# Patient Record
Sex: Female | Born: 1950 | Race: White | Hispanic: No | Smoking: Former smoker
Health system: Southern US, Community
[De-identification: ages and names within clinical notes are randomized; demographics above are authoritative.]

## PROBLEM LIST (undated history)

## (undated) DIAGNOSIS — J45909 Unspecified asthma, uncomplicated: Secondary | ICD-10-CM

## (undated) DIAGNOSIS — K922 Gastrointestinal hemorrhage, unspecified: Secondary | ICD-10-CM

## (undated) DIAGNOSIS — F419 Anxiety disorder, unspecified: Secondary | ICD-10-CM

## (undated) DIAGNOSIS — J189 Pneumonia, unspecified organism: Secondary | ICD-10-CM

## (undated) DIAGNOSIS — T8859XA Other complications of anesthesia, initial encounter: Secondary | ICD-10-CM

## (undated) DIAGNOSIS — C3490 Malignant neoplasm of unspecified part of unspecified bronchus or lung: Secondary | ICD-10-CM

## (undated) DIAGNOSIS — K589 Irritable bowel syndrome without diarrhea: Secondary | ICD-10-CM

## (undated) DIAGNOSIS — M1990 Unspecified osteoarthritis, unspecified site: Secondary | ICD-10-CM

## (undated) DIAGNOSIS — Z9981 Dependence on supplemental oxygen: Secondary | ICD-10-CM

## (undated) DIAGNOSIS — D696 Thrombocytopenia, unspecified: Secondary | ICD-10-CM

## (undated) DIAGNOSIS — K631 Perforation of intestine (nontraumatic): Secondary | ICD-10-CM

## (undated) DIAGNOSIS — R109 Unspecified abdominal pain: Secondary | ICD-10-CM

## (undated) DIAGNOSIS — R011 Cardiac murmur, unspecified: Secondary | ICD-10-CM

## (undated) DIAGNOSIS — N3281 Overactive bladder: Secondary | ICD-10-CM

## (undated) DIAGNOSIS — Z8711 Personal history of peptic ulcer disease: Secondary | ICD-10-CM

## (undated) DIAGNOSIS — F32A Depression, unspecified: Secondary | ICD-10-CM

## (undated) DIAGNOSIS — Z9289 Personal history of other medical treatment: Secondary | ICD-10-CM

## (undated) DIAGNOSIS — K219 Gastro-esophageal reflux disease without esophagitis: Secondary | ICD-10-CM

## (undated) DIAGNOSIS — J9610 Chronic respiratory failure, unspecified whether with hypoxia or hypercapnia: Secondary | ICD-10-CM

## (undated) DIAGNOSIS — K5732 Diverticulitis of large intestine without perforation or abscess without bleeding: Secondary | ICD-10-CM

## (undated) DIAGNOSIS — J42 Unspecified chronic bronchitis: Secondary | ICD-10-CM

## (undated) DIAGNOSIS — K7460 Unspecified cirrhosis of liver: Secondary | ICD-10-CM

## (undated) DIAGNOSIS — Z433 Encounter for attention to colostomy: Secondary | ICD-10-CM

## (undated) DIAGNOSIS — M5010 Cervical disc disorder with radiculopathy, unspecified cervical region: Secondary | ICD-10-CM

## (undated) DIAGNOSIS — G4730 Sleep apnea, unspecified: Secondary | ICD-10-CM

## (undated) DIAGNOSIS — R161 Splenomegaly, not elsewhere classified: Secondary | ICD-10-CM

## (undated) DIAGNOSIS — Z950 Presence of cardiac pacemaker: Secondary | ICD-10-CM

## (undated) DIAGNOSIS — D638 Anemia in other chronic diseases classified elsewhere: Secondary | ICD-10-CM

## (undated) DIAGNOSIS — I313 Pericardial effusion (noninflammatory): Secondary | ICD-10-CM

## (undated) DIAGNOSIS — F329 Major depressive disorder, single episode, unspecified: Secondary | ICD-10-CM

## (undated) DIAGNOSIS — G8929 Other chronic pain: Secondary | ICD-10-CM

## (undated) DIAGNOSIS — C539 Malignant neoplasm of cervix uteri, unspecified: Secondary | ICD-10-CM

## (undated) DIAGNOSIS — I10 Essential (primary) hypertension: Secondary | ICD-10-CM

## (undated) DIAGNOSIS — J449 Chronic obstructive pulmonary disease, unspecified: Secondary | ICD-10-CM

## (undated) DIAGNOSIS — G43909 Migraine, unspecified, not intractable, without status migrainosus: Secondary | ICD-10-CM

## (undated) DIAGNOSIS — E119 Type 2 diabetes mellitus without complications: Secondary | ICD-10-CM

## (undated) DIAGNOSIS — T4145XA Adverse effect of unspecified anesthetic, initial encounter: Secondary | ICD-10-CM

## (undated) DIAGNOSIS — E785 Hyperlipidemia, unspecified: Secondary | ICD-10-CM

## (undated) DIAGNOSIS — Z8719 Personal history of other diseases of the digestive system: Secondary | ICD-10-CM

## (undated) DIAGNOSIS — I509 Heart failure, unspecified: Secondary | ICD-10-CM

## (undated) DIAGNOSIS — J439 Emphysema, unspecified: Secondary | ICD-10-CM

## (undated) DIAGNOSIS — I3139 Other pericardial effusion (noninflammatory): Secondary | ICD-10-CM

## (undated) HISTORY — DX: Cardiac murmur, unspecified: R01.1

## (undated) HISTORY — DX: Anxiety disorder, unspecified: F41.9

## (undated) HISTORY — PX: HERNIA REPAIR: SHX51

## (undated) HISTORY — DX: Diverticulitis of large intestine without perforation or abscess without bleeding: K57.32

## (undated) HISTORY — DX: Major depressive disorder, single episode, unspecified: F32.9

## (undated) HISTORY — DX: Emphysema, unspecified: J43.9

## (undated) HISTORY — DX: Essential (primary) hypertension: I10

## (undated) HISTORY — DX: Splenomegaly, not elsewhere classified: R16.1

## (undated) HISTORY — PX: FRACTURE SURGERY: SHX138

## (undated) HISTORY — PX: COLON SURGERY: SHX602

## (undated) HISTORY — DX: Irritable bowel syndrome, unspecified: K58.9

## (undated) HISTORY — DX: Thrombocytopenia, unspecified: D69.6

## (undated) HISTORY — DX: Gastrointestinal hemorrhage, unspecified: K92.2

## (undated) HISTORY — DX: Depression, unspecified: F32.A

## (undated) HISTORY — DX: Unspecified osteoarthritis, unspecified site: M19.90

## (undated) HISTORY — DX: Presence of cardiac pacemaker: Z95.0

## (undated) HISTORY — DX: Hyperlipidemia, unspecified: E78.5

## (undated) HISTORY — PX: CHOLECYSTECTOMY: SHX55

## (undated) HISTORY — PX: TUBAL LIGATION: SHX77

## (undated) HISTORY — DX: Unspecified asthma, uncomplicated: J45.909

## (undated) HISTORY — DX: Overactive bladder: N32.81

## (undated) HISTORY — PX: ABDOMINAL HYSTERECTOMY: SHX81

## (undated) HISTORY — DX: Perforation of intestine (nontraumatic): K63.1

## (undated) HISTORY — DX: Malignant neoplasm of cervix uteri, unspecified: C53.9

## (undated) HISTORY — DX: Hypercalcemia: E83.52

## (undated) HISTORY — DX: Unspecified cirrhosis of liver: K74.60

---
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
## 1997-08-26 ENCOUNTER — Inpatient Hospital Stay (HOSPITAL_COMMUNITY): Admission: EM | Admit: 1997-08-26 | Discharge: 1997-08-29 | Payer: Self-pay | Admitting: Cardiology

## 2000-09-23 ENCOUNTER — Emergency Department (HOSPITAL_COMMUNITY): Admission: EM | Admit: 2000-09-23 | Discharge: 2000-09-23 | Payer: Self-pay | Admitting: Emergency Medicine

## 2000-09-24 ENCOUNTER — Encounter: Payer: Self-pay | Admitting: Emergency Medicine

## 2000-09-24 ENCOUNTER — Emergency Department (HOSPITAL_COMMUNITY): Admission: EM | Admit: 2000-09-24 | Discharge: 2000-09-24 | Payer: Self-pay | Admitting: Emergency Medicine

## 2000-11-15 ENCOUNTER — Emergency Department (HOSPITAL_COMMUNITY): Admission: EM | Admit: 2000-11-15 | Discharge: 2000-11-15 | Payer: Self-pay | Admitting: Emergency Medicine

## 2000-11-30 ENCOUNTER — Emergency Department (HOSPITAL_COMMUNITY): Admission: EM | Admit: 2000-11-30 | Discharge: 2000-11-30 | Payer: Self-pay | Admitting: *Deleted

## 2000-11-30 ENCOUNTER — Encounter: Payer: Self-pay | Admitting: *Deleted

## 2001-01-28 ENCOUNTER — Emergency Department (HOSPITAL_COMMUNITY): Admission: EM | Admit: 2001-01-28 | Discharge: 2001-01-28 | Payer: Self-pay | Admitting: Emergency Medicine

## 2001-01-28 ENCOUNTER — Encounter: Payer: Self-pay | Admitting: Emergency Medicine

## 2001-02-23 ENCOUNTER — Emergency Department (HOSPITAL_COMMUNITY): Admission: EM | Admit: 2001-02-23 | Discharge: 2001-02-23 | Payer: Self-pay | Admitting: Emergency Medicine

## 2001-02-23 ENCOUNTER — Encounter: Payer: Self-pay | Admitting: Emergency Medicine

## 2001-02-25 ENCOUNTER — Emergency Department (HOSPITAL_COMMUNITY): Admission: EM | Admit: 2001-02-25 | Discharge: 2001-02-25 | Payer: Self-pay | Admitting: Emergency Medicine

## 2001-07-05 ENCOUNTER — Encounter: Payer: Self-pay | Admitting: Emergency Medicine

## 2001-07-05 ENCOUNTER — Inpatient Hospital Stay (HOSPITAL_COMMUNITY): Admission: EM | Admit: 2001-07-05 | Discharge: 2001-07-06 | Payer: Self-pay | Admitting: Emergency Medicine

## 2001-07-06 ENCOUNTER — Encounter: Payer: Self-pay | Admitting: Cardiology

## 2001-07-06 ENCOUNTER — Encounter: Payer: Self-pay | Admitting: Internal Medicine

## 2001-12-30 ENCOUNTER — Encounter: Payer: Self-pay | Admitting: Family Medicine

## 2001-12-30 ENCOUNTER — Encounter: Admission: RE | Admit: 2001-12-30 | Discharge: 2001-12-30 | Payer: Self-pay | Admitting: Family Medicine

## 2002-01-07 ENCOUNTER — Encounter: Admission: RE | Admit: 2002-01-07 | Discharge: 2002-01-07 | Payer: Self-pay | Admitting: Family Medicine

## 2002-01-07 ENCOUNTER — Encounter: Payer: Self-pay | Admitting: Family Medicine

## 2002-01-28 DIAGNOSIS — C3490 Malignant neoplasm of unspecified part of unspecified bronchus or lung: Secondary | ICD-10-CM

## 2002-01-28 HISTORY — DX: Malignant neoplasm of unspecified part of unspecified bronchus or lung: C34.90

## 2002-01-28 HISTORY — PX: LUNG REMOVAL, PARTIAL: SHX233

## 2002-02-09 ENCOUNTER — Encounter: Payer: Self-pay | Admitting: Critical Care Medicine

## 2002-02-09 ENCOUNTER — Ambulatory Visit (HOSPITAL_COMMUNITY): Admission: RE | Admit: 2002-02-09 | Discharge: 2002-02-09 | Payer: Self-pay | Admitting: Critical Care Medicine

## 2002-02-09 ENCOUNTER — Encounter (INDEPENDENT_AMBULATORY_CARE_PROVIDER_SITE_OTHER): Payer: Self-pay | Admitting: *Deleted

## 2002-02-10 ENCOUNTER — Encounter (INDEPENDENT_AMBULATORY_CARE_PROVIDER_SITE_OTHER): Payer: Self-pay | Admitting: *Deleted

## 2002-03-18 ENCOUNTER — Encounter: Payer: Self-pay | Admitting: Thoracic Surgery (Cardiothoracic Vascular Surgery)

## 2002-03-22 ENCOUNTER — Encounter (INDEPENDENT_AMBULATORY_CARE_PROVIDER_SITE_OTHER): Payer: Self-pay | Admitting: *Deleted

## 2002-03-22 ENCOUNTER — Encounter: Payer: Self-pay | Admitting: Thoracic Surgery (Cardiothoracic Vascular Surgery)

## 2002-03-22 ENCOUNTER — Inpatient Hospital Stay (HOSPITAL_COMMUNITY)
Admission: RE | Admit: 2002-03-22 | Discharge: 2002-03-28 | Payer: Self-pay | Admitting: Thoracic Surgery (Cardiothoracic Vascular Surgery)

## 2002-03-23 ENCOUNTER — Encounter: Payer: Self-pay | Admitting: Thoracic Surgery (Cardiothoracic Vascular Surgery)

## 2002-03-24 ENCOUNTER — Encounter: Payer: Self-pay | Admitting: Thoracic Surgery (Cardiothoracic Vascular Surgery)

## 2002-03-25 ENCOUNTER — Encounter: Payer: Self-pay | Admitting: Thoracic Surgery (Cardiothoracic Vascular Surgery)

## 2002-03-26 ENCOUNTER — Encounter: Payer: Self-pay | Admitting: Thoracic Surgery (Cardiothoracic Vascular Surgery)

## 2002-03-27 ENCOUNTER — Encounter: Payer: Self-pay | Admitting: Thoracic Surgery (Cardiothoracic Vascular Surgery)

## 2002-04-12 ENCOUNTER — Encounter
Admission: RE | Admit: 2002-04-12 | Discharge: 2002-04-12 | Payer: Self-pay | Admitting: Thoracic Surgery (Cardiothoracic Vascular Surgery)

## 2002-04-12 ENCOUNTER — Encounter: Payer: Self-pay | Admitting: Thoracic Surgery (Cardiothoracic Vascular Surgery)

## 2002-04-15 ENCOUNTER — Encounter: Payer: Self-pay | Admitting: Thoracic Surgery (Cardiothoracic Vascular Surgery)

## 2002-04-15 ENCOUNTER — Encounter
Admission: RE | Admit: 2002-04-15 | Discharge: 2002-04-15 | Payer: Self-pay | Admitting: Thoracic Surgery (Cardiothoracic Vascular Surgery)

## 2002-06-24 ENCOUNTER — Encounter
Admission: RE | Admit: 2002-06-24 | Discharge: 2002-06-24 | Payer: Self-pay | Admitting: Thoracic Surgery (Cardiothoracic Vascular Surgery)

## 2002-06-24 ENCOUNTER — Encounter: Payer: Self-pay | Admitting: Thoracic Surgery (Cardiothoracic Vascular Surgery)

## 2002-10-12 ENCOUNTER — Encounter
Admission: RE | Admit: 2002-10-12 | Discharge: 2002-10-12 | Payer: Self-pay | Admitting: Thoracic Surgery (Cardiothoracic Vascular Surgery)

## 2002-10-12 ENCOUNTER — Encounter: Payer: Self-pay | Admitting: Thoracic Surgery (Cardiothoracic Vascular Surgery)

## 2002-10-22 ENCOUNTER — Encounter: Admission: RE | Admit: 2002-10-22 | Discharge: 2002-10-22 | Payer: Self-pay | Admitting: Family Medicine

## 2002-10-22 ENCOUNTER — Encounter: Payer: Self-pay | Admitting: Family Medicine

## 2002-11-02 ENCOUNTER — Encounter: Payer: Self-pay | Admitting: Oncology

## 2002-11-02 ENCOUNTER — Ambulatory Visit (HOSPITAL_COMMUNITY): Admission: RE | Admit: 2002-11-02 | Discharge: 2002-11-02 | Payer: Self-pay | Admitting: Oncology

## 2003-06-03 ENCOUNTER — Inpatient Hospital Stay (HOSPITAL_COMMUNITY): Admission: AD | Admit: 2003-06-03 | Discharge: 2003-06-04 | Payer: Self-pay | Admitting: Family Medicine

## 2003-08-17 ENCOUNTER — Ambulatory Visit (HOSPITAL_COMMUNITY): Admission: RE | Admit: 2003-08-17 | Discharge: 2003-08-17 | Payer: Self-pay | Admitting: Oncology

## 2003-11-30 ENCOUNTER — Ambulatory Visit: Payer: Self-pay | Admitting: Family Medicine

## 2003-12-06 ENCOUNTER — Ambulatory Visit: Payer: Self-pay | Admitting: Internal Medicine

## 2003-12-06 ENCOUNTER — Inpatient Hospital Stay (HOSPITAL_COMMUNITY): Admission: EM | Admit: 2003-12-06 | Discharge: 2003-12-09 | Payer: Self-pay

## 2003-12-06 ENCOUNTER — Ambulatory Visit: Payer: Self-pay | Admitting: Pulmonary Disease

## 2003-12-12 ENCOUNTER — Encounter (HOSPITAL_COMMUNITY): Admission: RE | Admit: 2003-12-12 | Discharge: 2004-03-11 | Payer: Self-pay

## 2003-12-15 ENCOUNTER — Ambulatory Visit: Payer: Self-pay | Admitting: Family Medicine

## 2003-12-26 ENCOUNTER — Ambulatory Visit: Payer: Self-pay | Admitting: Critical Care Medicine

## 2004-01-10 ENCOUNTER — Ambulatory Visit: Payer: Self-pay | Admitting: Internal Medicine

## 2004-01-16 ENCOUNTER — Ambulatory Visit: Payer: Self-pay | Admitting: Family Medicine

## 2004-01-18 ENCOUNTER — Ambulatory Visit: Payer: Self-pay | Admitting: Internal Medicine

## 2004-01-18 ENCOUNTER — Ambulatory Visit: Payer: Self-pay | Admitting: Cardiology

## 2004-01-19 ENCOUNTER — Encounter: Payer: Self-pay | Admitting: Cardiology

## 2004-01-19 ENCOUNTER — Inpatient Hospital Stay (HOSPITAL_COMMUNITY): Admission: EM | Admit: 2004-01-19 | Discharge: 2004-01-20 | Payer: Self-pay | Admitting: Emergency Medicine

## 2004-01-24 ENCOUNTER — Ambulatory Visit: Payer: Self-pay | Admitting: Family Medicine

## 2004-01-25 ENCOUNTER — Ambulatory Visit: Payer: Self-pay | Admitting: Critical Care Medicine

## 2004-02-06 ENCOUNTER — Ambulatory Visit: Payer: Self-pay | Admitting: Internal Medicine

## 2004-02-09 ENCOUNTER — Ambulatory Visit: Payer: Self-pay | Admitting: Oncology

## 2004-02-22 ENCOUNTER — Ambulatory Visit (HOSPITAL_COMMUNITY): Admission: RE | Admit: 2004-02-22 | Discharge: 2004-02-22 | Payer: Self-pay | Admitting: Oncology

## 2004-02-27 ENCOUNTER — Ambulatory Visit (HOSPITAL_COMMUNITY): Admission: RE | Admit: 2004-02-27 | Discharge: 2004-02-27 | Payer: Self-pay | Admitting: Oncology

## 2004-03-26 ENCOUNTER — Ambulatory Visit: Payer: Self-pay | Admitting: Critical Care Medicine

## 2004-03-28 ENCOUNTER — Ambulatory Visit: Payer: Self-pay | Admitting: Oncology

## 2004-03-30 ENCOUNTER — Ambulatory Visit: Payer: Self-pay | Admitting: Family Medicine

## 2004-04-23 ENCOUNTER — Ambulatory Visit: Payer: Self-pay | Admitting: Family Medicine

## 2004-05-14 ENCOUNTER — Encounter: Admission: RE | Admit: 2004-05-14 | Discharge: 2004-05-14 | Payer: Self-pay | Admitting: Family Medicine

## 2004-05-23 ENCOUNTER — Ambulatory Visit: Payer: Self-pay | Admitting: Internal Medicine

## 2004-07-10 ENCOUNTER — Ambulatory Visit: Payer: Self-pay | Admitting: Internal Medicine

## 2004-07-20 ENCOUNTER — Inpatient Hospital Stay (HOSPITAL_COMMUNITY): Admission: AD | Admit: 2004-07-20 | Discharge: 2004-07-25 | Payer: Self-pay | Admitting: Family Medicine

## 2004-07-21 ENCOUNTER — Ambulatory Visit: Payer: Self-pay | Admitting: Internal Medicine

## 2004-08-06 ENCOUNTER — Ambulatory Visit: Payer: Self-pay | Admitting: Family Medicine

## 2004-08-31 ENCOUNTER — Ambulatory Visit (HOSPITAL_COMMUNITY): Admission: RE | Admit: 2004-08-31 | Discharge: 2004-08-31 | Payer: Self-pay | Admitting: Oncology

## 2004-09-13 ENCOUNTER — Ambulatory Visit: Payer: Self-pay | Admitting: Internal Medicine

## 2004-09-27 ENCOUNTER — Ambulatory Visit: Payer: Self-pay | Admitting: Family Medicine

## 2004-10-04 ENCOUNTER — Encounter (INDEPENDENT_AMBULATORY_CARE_PROVIDER_SITE_OTHER): Payer: Self-pay | Admitting: Specialist

## 2004-10-04 ENCOUNTER — Ambulatory Visit (HOSPITAL_COMMUNITY)
Admission: RE | Admit: 2004-10-04 | Discharge: 2004-10-05 | Payer: Self-pay | Admitting: Thoracic Surgery (Cardiothoracic Vascular Surgery)

## 2004-10-05 ENCOUNTER — Ambulatory Visit: Payer: Self-pay | Admitting: Critical Care Medicine

## 2004-10-11 ENCOUNTER — Ambulatory Visit: Admission: RE | Admit: 2004-10-11 | Discharge: 2004-10-11 | Payer: Self-pay | Admitting: Critical Care Medicine

## 2004-10-23 ENCOUNTER — Ambulatory Visit: Payer: Self-pay | Admitting: Family Medicine

## 2004-10-23 ENCOUNTER — Ambulatory Visit: Payer: Self-pay | Admitting: Critical Care Medicine

## 2004-11-22 ENCOUNTER — Ambulatory Visit: Payer: Self-pay | Admitting: Critical Care Medicine

## 2004-12-05 ENCOUNTER — Ambulatory Visit: Payer: Self-pay | Admitting: Family Medicine

## 2004-12-28 ENCOUNTER — Ambulatory Visit: Payer: Self-pay | Admitting: Internal Medicine

## 2005-01-02 ENCOUNTER — Ambulatory Visit (HOSPITAL_COMMUNITY): Admission: RE | Admit: 2005-01-02 | Discharge: 2005-01-02 | Payer: Self-pay | Admitting: Internal Medicine

## 2005-01-29 ENCOUNTER — Ambulatory Visit: Payer: Self-pay | Admitting: Critical Care Medicine

## 2005-02-04 ENCOUNTER — Ambulatory Visit: Payer: Self-pay | Admitting: Family Medicine

## 2005-02-04 ENCOUNTER — Inpatient Hospital Stay (HOSPITAL_COMMUNITY): Admission: AD | Admit: 2005-02-04 | Discharge: 2005-02-12 | Payer: Self-pay | Admitting: Internal Medicine

## 2005-02-04 ENCOUNTER — Ambulatory Visit: Payer: Self-pay | Admitting: Cardiology

## 2005-02-07 ENCOUNTER — Encounter: Payer: Self-pay | Admitting: Internal Medicine

## 2005-02-15 ENCOUNTER — Ambulatory Visit: Payer: Self-pay | Admitting: Internal Medicine

## 2005-02-25 ENCOUNTER — Ambulatory Visit: Payer: Self-pay | Admitting: Family Medicine

## 2005-03-04 ENCOUNTER — Ambulatory Visit: Payer: Self-pay | Admitting: Family Medicine

## 2005-03-05 ENCOUNTER — Ambulatory Visit: Payer: Self-pay | Admitting: Cardiology

## 2005-03-08 ENCOUNTER — Ambulatory Visit: Payer: Self-pay

## 2005-04-02 ENCOUNTER — Ambulatory Visit: Payer: Self-pay | Admitting: Internal Medicine

## 2005-04-03 ENCOUNTER — Ambulatory Visit (HOSPITAL_COMMUNITY): Admission: RE | Admit: 2005-04-03 | Discharge: 2005-04-03 | Payer: Self-pay | Admitting: Internal Medicine

## 2005-04-15 ENCOUNTER — Ambulatory Visit: Payer: Self-pay | Admitting: Critical Care Medicine

## 2005-05-01 LAB — CBC WITH DIFFERENTIAL/PLATELET
Basophils Absolute: 0 10*3/uL (ref 0.0–0.1)
EOS%: 0.6 % (ref 0.0–7.0)
Eosinophils Absolute: 0 10*3/uL (ref 0.0–0.5)
HCT: 34.9 % (ref 34.8–46.6)
HGB: 11.9 g/dL (ref 11.6–15.9)
LYMPH%: 17.5 % (ref 14.0–48.0)
MCH: 29.2 pg (ref 26.0–34.0)
MCV: 85.8 fL (ref 81.0–101.0)
MONO%: 7.1 % (ref 0.0–13.0)
NEUT#: 5.9 10*3/uL (ref 1.5–6.5)
NEUT%: 74.4 % (ref 39.6–76.8)
Platelets: 112 10*3/uL — ABNORMAL LOW (ref 145–400)
RDW: 18.4 % — ABNORMAL HIGH (ref 11.3–14.5)

## 2005-05-06 ENCOUNTER — Encounter
Admission: RE | Admit: 2005-05-06 | Discharge: 2005-05-06 | Payer: Self-pay | Admitting: Thoracic Surgery (Cardiothoracic Vascular Surgery)

## 2005-05-09 ENCOUNTER — Ambulatory Visit: Payer: Self-pay | Admitting: Family Medicine

## 2005-05-17 LAB — CBC WITH DIFFERENTIAL/PLATELET
BASO%: 1 % (ref 0.0–2.0)
Basophils Absolute: 0.1 10*3/uL (ref 0.0–0.1)
EOS%: 1 % (ref 0.0–7.0)
Eosinophils Absolute: 0.1 10*3/uL (ref 0.0–0.5)
HCT: 36.8 % (ref 34.8–46.6)
HGB: 12.5 g/dL (ref 11.6–15.9)
LYMPH%: 14.6 % (ref 14.0–48.0)
MCH: 29.9 pg (ref 26.0–34.0)
MCHC: 33.9 g/dL (ref 32.0–36.0)
MCV: 88.4 fL (ref 81.0–101.0)
MONO#: 0.5 10*3/uL (ref 0.1–0.9)
MONO%: 6.7 % (ref 0.0–13.0)
NEUT#: 6.1 10*3/uL (ref 1.5–6.5)
NEUT%: 76.7 % (ref 39.6–76.8)
Platelets: 117 10*3/uL — ABNORMAL LOW (ref 145–400)
RBC: 4.17 10*6/uL (ref 3.70–5.32)
RDW: 18.2 % — ABNORMAL HIGH (ref 11.3–14.5)
WBC: 7.9 10*3/uL (ref 3.9–10.0)
lymph#: 1.2 10*3/uL (ref 0.9–3.3)

## 2005-05-28 ENCOUNTER — Ambulatory Visit: Payer: Self-pay | Admitting: Internal Medicine

## 2005-05-31 LAB — CBC WITH DIFFERENTIAL/PLATELET
Basophils Absolute: 0.1 10*3/uL (ref 0.0–0.1)
EOS%: 1.2 % (ref 0.0–7.0)
Eosinophils Absolute: 0.1 10*3/uL (ref 0.0–0.5)
HCT: 37.5 % (ref 34.8–46.6)
HGB: 12.7 g/dL (ref 11.6–15.9)
MCH: 30.1 pg (ref 26.0–34.0)
NEUT#: 5.2 10*3/uL (ref 1.5–6.5)
NEUT%: 70.2 % (ref 39.6–76.8)
RDW: 17 % — ABNORMAL HIGH (ref 11.3–14.5)
lymph#: 1.6 10*3/uL (ref 0.9–3.3)

## 2005-08-21 ENCOUNTER — Ambulatory Visit: Payer: Self-pay | Admitting: Critical Care Medicine

## 2005-10-08 ENCOUNTER — Ambulatory Visit: Payer: Self-pay | Admitting: Internal Medicine

## 2005-10-14 ENCOUNTER — Ambulatory Visit: Payer: Self-pay | Admitting: Gastroenterology

## 2005-10-17 ENCOUNTER — Ambulatory Visit (HOSPITAL_COMMUNITY): Admission: RE | Admit: 2005-10-17 | Discharge: 2005-10-17 | Payer: Self-pay | Admitting: Internal Medicine

## 2005-10-17 LAB — CBC WITH DIFFERENTIAL/PLATELET
Basophils Absolute: 0 10*3/uL (ref 0.0–0.1)
EOS%: 2.1 % (ref 0.0–7.0)
Eosinophils Absolute: 0.1 10*3/uL (ref 0.0–0.5)
HCT: 34.9 % (ref 34.8–46.6)
HGB: 12 g/dL (ref 11.6–15.9)
MCH: 33.1 pg (ref 26.0–34.0)
MCV: 96.2 fL (ref 81.0–101.0)
MONO%: 7.6 % (ref 0.0–13.0)
NEUT#: 4.4 10*3/uL (ref 1.5–6.5)
NEUT%: 68.8 % (ref 39.6–76.8)

## 2005-10-17 LAB — COMPREHENSIVE METABOLIC PANEL
AST: 28 U/L (ref 0–37)
Albumin: 4.6 g/dL (ref 3.5–5.2)
Alkaline Phosphatase: 65 U/L (ref 39–117)
BUN: 12 mg/dL (ref 6–23)
Calcium: 9.8 mg/dL (ref 8.4–10.5)
Chloride: 99 mEq/L (ref 96–112)
Creatinine, Ser: 0.62 mg/dL (ref 0.40–1.20)
Glucose, Bld: 187 mg/dL — ABNORMAL HIGH (ref 70–99)

## 2005-11-06 ENCOUNTER — Ambulatory Visit: Payer: Self-pay | Admitting: Emergency Medicine

## 2005-11-06 ENCOUNTER — Inpatient Hospital Stay (HOSPITAL_COMMUNITY): Admission: RE | Admit: 2005-11-06 | Discharge: 2005-11-20 | Payer: Self-pay | Admitting: Surgery

## 2005-11-06 ENCOUNTER — Encounter (INDEPENDENT_AMBULATORY_CARE_PROVIDER_SITE_OTHER): Payer: Self-pay | Admitting: *Deleted

## 2005-11-06 ENCOUNTER — Encounter: Payer: Self-pay | Admitting: Gastroenterology

## 2005-11-06 ENCOUNTER — Encounter (INDEPENDENT_AMBULATORY_CARE_PROVIDER_SITE_OTHER): Payer: Self-pay | Admitting: Specialist

## 2005-11-06 HISTORY — PX: COLOSTOMY: SHX63

## 2005-11-06 HISTORY — PX: LEFT COLECTOMY: SHX856

## 2005-11-11 ENCOUNTER — Ambulatory Visit: Payer: Self-pay | Admitting: Gastroenterology

## 2006-04-04 ENCOUNTER — Ambulatory Visit: Payer: Self-pay | Admitting: Family Medicine

## 2006-05-01 ENCOUNTER — Ambulatory Visit: Payer: Self-pay | Admitting: Family Medicine

## 2006-05-27 ENCOUNTER — Ambulatory Visit: Payer: Self-pay | Admitting: Family Medicine

## 2006-06-03 ENCOUNTER — Ambulatory Visit: Payer: Self-pay | Admitting: Gastroenterology

## 2006-07-07 ENCOUNTER — Encounter: Payer: Self-pay | Admitting: Gastroenterology

## 2006-07-07 ENCOUNTER — Ambulatory Visit (HOSPITAL_COMMUNITY): Admission: RE | Admit: 2006-07-07 | Discharge: 2006-07-07 | Payer: Self-pay | Admitting: Gastroenterology

## 2006-07-18 ENCOUNTER — Ambulatory Visit: Payer: Self-pay | Admitting: Gastroenterology

## 2006-09-08 ENCOUNTER — Ambulatory Visit: Payer: Self-pay | Admitting: Gastroenterology

## 2006-09-08 LAB — CONVERTED CEMR LAB
Basophils Absolute: 0.1 10*3/uL (ref 0.0–0.1)
Basophils Relative: 0.9 % (ref 0.0–1.0)
Eosinophils Absolute: 0 10*3/uL (ref 0.0–0.6)
Eosinophils Relative: 0.6 % (ref 0.0–5.0)
Hemoglobin: 11.7 g/dL — ABNORMAL LOW (ref 12.0–15.0)
Lymphocytes Relative: 14.6 % (ref 12.0–46.0)
MCHC: 34.3 g/dL (ref 30.0–36.0)
Monocytes Absolute: 0.4 10*3/uL (ref 0.2–0.7)
Monocytes Relative: 6.3 % (ref 3.0–11.0)
Platelets: 127 10*3/uL — ABNORMAL LOW (ref 150–400)
RBC: 3.86 M/uL — ABNORMAL LOW (ref 3.87–5.11)

## 2006-09-15 DIAGNOSIS — Z860100 Personal history of colon polyps, unspecified: Secondary | ICD-10-CM | POA: Insufficient documentation

## 2006-09-15 DIAGNOSIS — R519 Headache, unspecified: Secondary | ICD-10-CM | POA: Insufficient documentation

## 2006-09-15 DIAGNOSIS — K219 Gastro-esophageal reflux disease without esophagitis: Secondary | ICD-10-CM | POA: Insufficient documentation

## 2006-09-15 DIAGNOSIS — E1140 Type 2 diabetes mellitus with diabetic neuropathy, unspecified: Secondary | ICD-10-CM

## 2006-09-15 DIAGNOSIS — R51 Headache: Secondary | ICD-10-CM

## 2006-09-15 DIAGNOSIS — J45909 Unspecified asthma, uncomplicated: Secondary | ICD-10-CM | POA: Insufficient documentation

## 2006-09-15 DIAGNOSIS — IMO0002 Reserved for concepts with insufficient information to code with codable children: Secondary | ICD-10-CM | POA: Insufficient documentation

## 2006-09-15 DIAGNOSIS — E1165 Type 2 diabetes mellitus with hyperglycemia: Secondary | ICD-10-CM

## 2006-09-19 ENCOUNTER — Ambulatory Visit: Payer: Self-pay | Admitting: Gastroenterology

## 2006-10-27 ENCOUNTER — Ambulatory Visit: Payer: Self-pay | Admitting: Internal Medicine

## 2006-10-27 ENCOUNTER — Ambulatory Visit: Payer: Self-pay | Admitting: Family Medicine

## 2006-10-30 ENCOUNTER — Encounter: Payer: Self-pay | Admitting: Family Medicine

## 2006-11-03 LAB — COMPREHENSIVE METABOLIC PANEL
ALT: 13 U/L (ref 0–35)
CO2: 25 mEq/L (ref 19–32)
Creatinine, Ser: 0.82 mg/dL (ref 0.40–1.20)
Total Bilirubin: 0.3 mg/dL (ref 0.3–1.2)

## 2006-11-03 LAB — CBC WITH DIFFERENTIAL/PLATELET
BASO%: 0.4 % (ref 0.0–2.0)
HCT: 32.5 % — ABNORMAL LOW (ref 34.8–46.6)
LYMPH%: 17 % (ref 14.0–48.0)
MCH: 30.1 pg (ref 26.0–34.0)
MCHC: 34.6 g/dL (ref 32.0–36.0)
MONO#: 0.4 10*3/uL (ref 0.1–0.9)
NEUT%: 74.4 % (ref 39.6–76.8)
Platelets: 103 10*3/uL — ABNORMAL LOW (ref 145–400)
WBC: 5.3 10*3/uL (ref 3.9–10.0)

## 2006-11-04 ENCOUNTER — Ambulatory Visit (HOSPITAL_COMMUNITY): Admission: RE | Admit: 2006-11-04 | Discharge: 2006-11-04 | Payer: Self-pay | Admitting: Internal Medicine

## 2006-11-07 ENCOUNTER — Encounter: Payer: Self-pay | Admitting: Critical Care Medicine

## 2006-11-07 ENCOUNTER — Encounter: Payer: Self-pay | Admitting: Family Medicine

## 2006-11-26 ENCOUNTER — Encounter: Admission: RE | Admit: 2006-11-26 | Discharge: 2006-11-26 | Payer: Self-pay | Admitting: Internal Medicine

## 2006-11-27 ENCOUNTER — Encounter: Admission: RE | Admit: 2006-11-27 | Discharge: 2006-11-27 | Payer: Self-pay | Admitting: Internal Medicine

## 2006-12-03 ENCOUNTER — Ambulatory Visit: Payer: Self-pay | Admitting: Family Medicine

## 2006-12-09 ENCOUNTER — Encounter: Payer: Self-pay | Admitting: Family Medicine

## 2006-12-09 LAB — COMPREHENSIVE METABOLIC PANEL
BUN: 13 mg/dL (ref 6–23)
CO2: 23 mEq/L (ref 19–32)
Calcium: 9.4 mg/dL (ref 8.4–10.5)
Creatinine, Ser: 0.81 mg/dL (ref 0.40–1.20)
Glucose, Bld: 319 mg/dL — ABNORMAL HIGH (ref 70–99)
Total Bilirubin: 0.4 mg/dL (ref 0.3–1.2)

## 2006-12-09 LAB — CBC WITH DIFFERENTIAL/PLATELET
Basophils Absolute: 0 10*3/uL (ref 0.0–0.1)
Eosinophils Absolute: 0.1 10*3/uL (ref 0.0–0.5)
HCT: 34.2 % — ABNORMAL LOW (ref 34.8–46.6)
HGB: 12 g/dL (ref 11.6–15.9)
LYMPH%: 14.4 % (ref 14.0–48.0)
MCHC: 35 g/dL (ref 32.0–36.0)
MONO#: 0.4 10*3/uL (ref 0.1–0.9)
NEUT#: 5 10*3/uL (ref 1.5–6.5)
NEUT%: 77.5 % — ABNORMAL HIGH (ref 39.6–76.8)
Platelets: 104 10*3/uL — ABNORMAL LOW (ref 145–400)
WBC: 6.4 10*3/uL (ref 3.9–10.0)

## 2006-12-15 ENCOUNTER — Ambulatory Visit: Payer: Self-pay | Admitting: Critical Care Medicine

## 2006-12-17 ENCOUNTER — Ambulatory Visit: Payer: Self-pay | Admitting: Gastroenterology

## 2007-02-26 DIAGNOSIS — I10 Essential (primary) hypertension: Secondary | ICD-10-CM

## 2007-02-26 DIAGNOSIS — J439 Emphysema, unspecified: Secondary | ICD-10-CM

## 2007-02-26 DIAGNOSIS — J189 Pneumonia, unspecified organism: Secondary | ICD-10-CM

## 2007-02-26 DIAGNOSIS — C3490 Malignant neoplasm of unspecified part of unspecified bronchus or lung: Secondary | ICD-10-CM

## 2007-02-26 DIAGNOSIS — F339 Major depressive disorder, recurrent, unspecified: Secondary | ICD-10-CM

## 2007-02-26 HISTORY — DX: Emphysema, unspecified: J43.9

## 2007-05-25 ENCOUNTER — Telehealth: Payer: Self-pay | Admitting: Family Medicine

## 2007-07-07 ENCOUNTER — Encounter: Payer: Self-pay | Admitting: Gastroenterology

## 2007-07-17 ENCOUNTER — Telehealth: Payer: Self-pay | Admitting: Gastroenterology

## 2007-09-28 ENCOUNTER — Encounter: Payer: Self-pay | Admitting: Family Medicine

## 2010-01-28 DIAGNOSIS — G4730 Sleep apnea, unspecified: Secondary | ICD-10-CM

## 2010-01-28 HISTORY — DX: Sleep apnea, unspecified: G47.30

## 2010-02-17 ENCOUNTER — Encounter: Payer: Self-pay | Admitting: Thoracic Surgery (Cardiothoracic Vascular Surgery)

## 2010-02-17 ENCOUNTER — Encounter: Payer: Self-pay | Admitting: Oncology

## 2010-02-17 ENCOUNTER — Encounter: Payer: Self-pay | Admitting: Internal Medicine

## 2010-02-18 ENCOUNTER — Encounter: Payer: Self-pay | Admitting: Internal Medicine

## 2010-03-29 HISTORY — PX: OTHER SURGICAL HISTORY: SHX169

## 2010-03-29 HISTORY — PX: UMBILICAL HERNIA REPAIR: SHX196

## 2010-05-29 ENCOUNTER — Inpatient Hospital Stay (INDEPENDENT_AMBULATORY_CARE_PROVIDER_SITE_OTHER)
Admission: RE | Admit: 2010-05-29 | Discharge: 2010-05-29 | Disposition: A | Discharge status: Home / Self Care | Payer: No Typology Code available for payment source | Source: Ambulatory Visit | Attending: Family Medicine | Admitting: Family Medicine

## 2010-05-29 ENCOUNTER — Ambulatory Visit (INDEPENDENT_AMBULATORY_CARE_PROVIDER_SITE_OTHER): Payer: No Typology Code available for payment source

## 2010-05-29 DIAGNOSIS — S90129A Contusion of unspecified lesser toe(s) without damage to nail, initial encounter: Secondary | ICD-10-CM

## 2010-06-12 NOTE — Assessment & Plan Note (Signed)
[facility]                         GASTROENTEROLOGY OFFICE NOTE   [name], [name]                      MRN:          [id number]  DATE:09/08/2006                            DOB:          02-Aug-1950    PROBLEM:  Bleeding per ostomy.   [name] has returned for scheduled followup.  She has had limited  bleeding in her ostomy bag.  It is very intermittent, and has caused  fluid to turn rose colored.  She does have some crampy abdominal pain,  which is well controlled with hyoscyamine.  She has yet to be  reevaluated by Dr. Delford Field.  Transportation difficulties have precluded  visit.  She is otherwise doing well.   EXAMINATION:  Pulse 92.  Blood pressure 110/80.  Weight 189.   IMPRESSION:  1. Chronic polyposis, status post diverting colostomy for a perforated      sigmoid colon in October 2007.  2. Limited bleeding per ostomy.  I suspect this is from a local source      from mucosal irritation.  3. 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   RECOMMENDATIONS:  1. Limited examination to the ostomy to determine the bleeding source.  2. Patient will schedule an appointment with Dr. Delford Field to reevaluate      the feasibility of taking out her colostomy.     Barbette Hair. Arlyce Dice, MD,FACG  Electronically Signed    RDK/MedQ  DD: 09/08/2006  DT: 09/09/2006  Job #: [id number]   cc:   Jeannett Senior A. Clent Ridges, MD  Alfonse Ras, MD

## 2010-06-12 NOTE — Assessment & Plan Note (Signed)
[facility]                         GASTROENTEROLOGY OFFICE NOTE   [name], [name]                      MRN:          [id number]  DATE:12/17/2006                            DOB:          1950/03/17    PROBLEM:  Colonic polyposis.   REASON:  [name] has returned for scheduled followup. She has had  very intermittent, minimal bleeding to her ostomy. She actually did not  undergo examination of her ostomy with a colonoscope. She has had mild  crampy abdominal pain which responds well to hyoscyamine. She was  recently evaluated by Dr. Delford Field who felt that she can undergo reversal  of her colostomy. She will do this after she moves to [place],  [place]. She has no other GI complaints.   On exam, pulse 60, blood pressure 138/60, weight 192.   IMPRESSION:  1. Colonic polyposis. She has adenomatous polyps that were removed.      She will require followup colonoscopy in about two years.  2. Minimal bleeding per ostomy - likely secondary to local irritation.  3. Oxygen-dependent chronic obstructive pulmonary disease.   RECOMMENDATIONS:  1. The patient will continue her care in [place], [place].      Records will be forwarded.     Barbette Hair. Arlyce Dice, MD,FACG  Electronically Signed    RDK/MedQ  DD: 12/17/2006  DT: 12/17/2006  Job #: [id number]

## 2010-06-12 NOTE — Assessment & Plan Note (Signed)
[facility]                             PULMONARY OFFICE NOTE   Catherine, Berry                      MRN:          [id number]  DATE:12/15/2006                            DOB:          1950/08/19    [name] is seen in return.  This is a 60 year old white female with  asthmatic bronchitis, chronic obstructive airways disease.  She is  having not much mucus production.  No chest pain.  No wheezing.  Minimal  shortness of breath.   CURRENT MEDICATIONS:  1. Maintains oxygen 2L continuous.  2. DuoNeb q.i.d.  3. Advair 250/50 one spray b.i.d.  Other maintenance medicines are listed in the chart, correct as  reviewed.   Since I last saw her in July 2007, she underwent emergent laparotomy for  diverticulitis.  She required an ostomy placement.  The question today  is whether or not she could tolerate having the ostomy taken back down.  She is going to be moving to [place] very shortly.   EXAM:  Temperature 98, blood pressure 110/74, pulse 92, saturation 98%  on 2L.  CHEST:  Showed distant breath sounds with prolonged expiratory phase.  No wheeze or rhonchi noted.  CARDIAC:  Showed a regular rate and rhythm without S3.  Normal S1, S2.  ABDOMEN:  Soft, nontender.  EXTREMITIES:  No edema or clubbing.  SKIN:  Clear.   Pulmonary functions obtained showed spirometry essentially with normal  spirometry, FEV1 71% predicted, FVC of 80% of predicted.   IMPRESSION:  Reasonably well-preserved lung function despite severity of  chronic airway disease.  From my perspective, the patient could undergo  ostomy takedown if necessary.   RECOMMENDATIONS:  Maintain neb and inhaled medications as prescribed.  She may be cleared for ostomy takedown whenever she and her surgeon deem  fit.     Catherine Cradle Delford Field, MD, [facility]  Electronically Signed    PEW/MedQ  DD: 12/15/2006  DT: 12/16/2006  Job #: [id number]   cc:   Jeannett Senior A. Clent Ridges, MD  Alfonse Ras,  MD  Barbette Hair. Arlyce Dice, MD,FACG

## 2010-06-15 NOTE — Discharge Summary (Signed)
[name], [name]               ACCOUNT NO.:  [id number]   MEDICAL RECORD NO.:  [id number]          PATIENT TYPE:  INP   LOCATION:  5714                         FACILITY:  [facility]   PHYSICIAN:  Thornton Park. Daphine Deutscher, MD  DATE OF BIRTH:  Dec 14, 1950   DATE OF ADMISSION:  11/06/2005  DATE OF DISCHARGE:  11/20/2005                               DISCHARGE SUMMARY   DISCHARGE PHYSICIAN:  Dr. Daphine Deutscher.   CONSULTANTS:  Dr. Baruch Merl with Surgery and Critical Care Medicine,  Dr. Marcos Eke.   CHIEF COMPLAINT/REASON FOR ADMISSION:  [name] is a 60 year old  female patient with O2 dependent COPD, who had undergone outpatient  screening colonoscopy.  This was done under anesthesia, because of her  poor pulmonary status.  Today during the procedure with withdrawal of  the scope, Dr. Arlyce Dice noticed a perforation of the sigmoid colon, and  the patient was currently complaining of abdominal pain.  Surgical  consultation was requested.  Dr. Colin Benton did see the patient in the  endoscopic recovery area.  Her vital signs were stable.  Her abdomen was  quite tender to palpation, and it was determined that we she would need  exploratory laparotomy with possible colostomy, due to perforated  sigmoid colon.   ADMITTING DIAGNOSES:  1. Perforated colon status post colonoscopy.  2. O2 dependent chronic obstructive pulmonary disease.  3. Prior history of lung cancer and prior lobectomy 2004.  4. Insulin-dependent diabetes mellitus.  5. Arthritis.   HOSPITAL COURSE:  The patient was taken to the OR on the date of  admission by Dr. Colin Benton, where she underwent an exploratory laparotomy  with resection of sigmoid colon, Hartmann's procedure with colostomy.  Because of her underlying pulmonary status, the patient remained on the  ventilator in the immediate postoperative period, and critical care  medicine assisted [place] with her care.  In the immediate post-op period,  the patient did experience shock requiring  pressor agents, and she  spiked a temperature as high as 103 degrees Fahrenheit. Because of  suspected issues related to ventilator-dependent respiratory failure and  post-operative ileus, IV nutrition with TPN was also started early.  By  post-op day #2, the patient's pressor agents were weaned, and she was  weaned off the ventilator and extubated to 3 liters nasal cannula O2.  She was troubled with a low hemoglobin and was started on Procrit for  this.  She did not receive any blood transfusions during the  hospitalization.   Over the next several days, the patient slowly regained bowel function.  She did have postoperative ileus.  The stoma was swollen and edematous  and dusky purple-colored, but did have good capillary refill when  checked with a light source, and it was felt that the stoma was viable.  Wound care ostomy nurse also assisted the patient and her family in  wound care.  By post-op day #8, the patient was started on a soft  mechanical diet.  She was experiencing some nausea and pain immediately  after that diet, but by post-op day 10, she was tolerating a diet  without incident.  Her TNA was weaned, and she herself was participating  in stoma care.   By post-op day 12, she was walking in the halls complaining of mild  incisional pain.  She had some peri-staple redness and yellow-tan  purulence at the base of the wound, namely around the umbilical area.  Staples were removed purulent drainage, a moderate amount was also  removed and cultures were sent, and by post-day 14.  Wound looked better  after wound was open.  She was tolerating normal saline packing b.i.d.  Preliminary culture was positive for staph, and due to the high  incidence, it was a suspicion that the patient may have methicillin-  resistant staph.  Dr. Daphine Deutscher empirically placed the patient doxycycline  100 mg b.i.d. for 7 days, with plans for our office follow up with a  culture after discharge.    Note:  This discharge summary is being dictated on 12th 10/2005 and by  11/21/2005, it was noted that the patient had community-acquired  methicillin-resistant staph aureus.   DISCHARGE DIAGNOSES:  1. Sigmoid colectomy with Hartmann's procedure colostomy secondary to      perforated colon/  2. Postoperative wound infection, positive for methicillin-resistant      staph aureus.  3. Postoperative ileus, resolved.  4. O2 dependent COPD, steroid dependent with recent ventilator-      dependent respiratory failure in the immediate post-op period,      resolved.  5. Status anemia and shock to the immediate post-op period, stable.  6. Diabetes mellitus.   DISCHARGE MEDICATIONS:  1. Doxycycline 100 mg b.i.d. for 7 days.  2. Lantus 44 units twice daily.  3. Metformin.  Please hold this medication.  4. Glyburide.  Please hold this medication.  5. Darvocet.  Please hold this medication.  6. Continue home oxygen.  7. Resume prior home medications.  8. Percocet 7.5/ 325, 1 tablet every 4 hours as needed for pain.   DIET:  No restrictions.   ACTIVITY:  Increase activity slowly.  May shower.  May walk up steps.  No lifting for 4 weeks.   WOUND CARE:  Normal saline packing to the abdominal wound twice daily,  dry dressing, ostomy care as instructed.   FOLLOW-UP APPOINTMENTS:  She is to see Dr. Colin Benton in the office [phone],  on Friday November 2nd at 1:45 p.m.   HOME HEALTH CARE AGENCY:  [facility].   ADDITIONAL INSTRUCTIONS:  Please bring pink discharge sheet with you  when you see the surgeon.  They need to follow up on wound cultures that  are positive for Staph aureus.  The sensitivities were pending at time  of discharge.      Allison L. Gwyneth Sprout Daphine Deutscher, MD  Electronically Signed    ALE/MEDQ  D:  01/06/2006  T:  01/06/2006  Job:  [id number]   cc:   Alfonse Ras, MD  Dr. Arlyce Dice

## 2010-06-15 NOTE — H&P (Signed)
NAMEMARNE, Catherine               ACCOUNT NO.:  [id number]   MEDICAL RECORD NO.:  [id number]          PATIENT TYPE:  INP   LOCATION:  4742                         FACILITY:  [facility]   PHYSICIAN:  Tera Mater. Clent Ridges, M.D. [facility] OF BIRTH:  [birth year]   DATE OF ADMISSION:  02/04/2005  DATE OF DISCHARGE:                                HISTORY & PHYSICAL   CHIEF COMPLAINT:  This is a 60 year old woman with oxygen-dependent COPD,  presenting with 24 hours of productive cough, fever, and worsening shortness  of breath.   HISTORY OF PRESENT ILLNESS:  The patient has sudden onset yesterday of  increased shortness of breath, wheezing, coughing up yellow sputum, and  fever.  She denies any chest pain.  She had some diarrhea. She has had some  mild nausea but has not vomited.  She continues to use her nasal cannula  oxygen as usual.  She was brought to our clinic today by her family.   PAST MEDICAL HISTORY:  Remarkable for:  1.  Severe chronic obstructive pulmonary disease as well as asthma.  She is      oxygen dependent and wears it 24 hours a day at home.  She sees Dr. Danise Mina for pulmonary care. 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 Veleta Miners in the oncology      department periodically for followup of this problem.  2.  History of colon polyps and irritable bowel syndrome.  3.  GE reflux disease.  4.  Type 2 diabetes mellitus, now controlled with oral medications as well      as insulin.  5.  Hypertension.  6.  Carpal tunnel syndrome in the right hand.  7.  Degenerative arthritis.  8.  Chronic anxiety.   ALLERGIES:  1.  PENICILLIN.  2.  KEFLEX.  3.  FLEXERIL.  4.  CODEINE.  5.  MORPHINE.   CURRENT MEDICATIONS:  1.  Amitriptyline 25 mg twice daily.  2.  Nexium 40 mg twice daily.  3.  Advair 250/50 one puff twice daily.  4.  Glyburide 2.5 mg twice daily.  5.  Metformin 1000 mg 1/2 tablet twice daily.  6.  Prednisone 10 mg  daily.  7.  DuoNeb nebulizers 4 times a day.  8.  Oxygen per nasal cannula at 2 liters.  9.  Bentyl 20 mg 4 times a day.  10. Lantus 60 units nightly.  11. Benicar 20 mg daily.  12. Reglan 10 mg 4 times a day.  13. Lasix 40 mg daily.  14. Aranesp injections every 2 weeks.  15. Darvocet 2 four times a day.  16. Diazepam 5 mg 3 times a day.   HABITS:  She was a long-time smoker but has stopped.  She does not use  alcohol.   SOCIAL HISTORY:  She is retired and disabled.   PHYSICAL EXAMINATION:  VITAL SIGNS:  Temperature 100.9 degrees, blood  pressure 90/60, pulse 120 and regular, respirations 26 and labored.  GENERAL:  She is weak,  tachypneic, and has audible rales.  SKIN:  Cool and clammy.  HEENT:  Eyes are clear, ears clear, oropharynx clear.  NECK:  Supple without lymphadenopathy or masses.  LUNGS: Show diffuse loud rales everywhere up to the level of the neck.  Air  flow is limited.  CARDIAC: Rate is rapid; rhythm is regular.  No gallops, murmurs, or rubs are  detected, although difficult to hear due to the diffuse rales.  ABDOMEN: Soft, bowel sounds, nontender, no masses.  EXTREMITIES:  No clubbing or cyanosis.  She does have 1+ edema around both  ankles.  NEUROLOGIC:  Exam is grossly intact.   LABORATORY DATA:  Oxygen saturation on 2 liters of nasal cannula O2 is 92%.   EKG shows sinus tachycardia at a rate of about 120; otherwise no significant  changes from baseline.   ASSESSMENT AND PLAN:  1.  Upper respiratory infection, probably pneumonia.  She will be admitted      for appropriate laboratory testing, chest x-ray, and appropriate      antibiotic treatment.  2.  Acute exacerbation of chronic obstructive pulmonary disease.  She will      be stabilized with nebulization treatment, nasal cannula oxygen, etc.  3.  Pulmonary edema, probably rate dependent with her tachycardia.  She will      be diuresed with IV diuretics.  Electrolytes will be followed closely.       Cardiac enzymes will be obtained.  She will probably require further      cardiac testing once the acute illness is better managed.  4.  Type 2 diabetes mellitus: Her blood glucose will need to be watched      carefully in the hospital.           ______________________________  Tera Mater. Clent Ridges, M.D. [facility]     SAF/MEDQ  D:  02/04/2005  T:  02/04/2005  Job:  [id number]

## 2010-06-15 NOTE — Consult Note (Signed)
[name], [name] NO.:  [id number]   MEDICAL RECORD NO.:  [id number]          PATIENT TYPE:  INP   LOCATION:  2303                         FACILITY:  [facility]   PHYSICIAN:  Alfonse Ras, MD   DATE OF BIRTH:  [birth year]   DATE OF CONSULTATION:  11/06/2005  DATE OF DISCHARGE:                                   CONSULTATION   REFERRING PHYSICIAN:  Barbette Hair. Arlyce Dice, MD,FACG   REFERRAL DIAGNOSIS:  Perforated colon, status post colonoscopy.   HISTORY OF PRESENT ILLNESS:  The patient is a 60 year old white female with  oxygen-dependent COPD.  The patient was seen by Dr. Arlyce Dice in the past and  has undergone routine screening colonoscopy with the help of anesthesia  secondary to her poor pulmonary condition.  Undergoing colonoscopy today, on  withdrawal of the scope, Dr. Arlyce Dice noticed a perforation in the sigmoid  colon.  The patient complains of abdominal pain in the recovery room and is  quite tender on palpation.   PAST MEDICAL HISTORY:  1. Significant or oxygen-dependent COPD.  2. History of lung cancer which was treated with lobectomy in 2004.  3. History of insulin-dependent diabetes mellitus.  4. Arthritis.  5. Depression.  6. Status post appendectomy.  7. Hysterectomy.  8. Tubal ligation.  9. Umbilical herniorrhaphy.  10.Cholecystectomy.   MEDICATIONS:  Amitriptyline, diazepam, Benicar, furosemide, metoclopramide,  Nexium, glyburide, metformin, Advair, Darvocet, insulin, DuoNeb, Bentyl, and  iron.   ALLERGIES:  She claims to be allergic to CODEINE, KEFLEX, MORPHINE,  PENICILLIN, VICODIN.   PHYSICAL EXAMINATION:  VITAL SIGNS:  In the recovery room her heart rate is  96.  Her temperature is 100.2.  HEENT:  Benign.  Normocephalic, atraumatic.  She has 100% oxygen face mask  on.  LUNGS:  Clear to auscultation and percussion.  ABDOMEN:  Morbidly obese and quite tender to my palpation.   The remainder of the physical examination is curbed secondary  to the  patient's left lateral decubitus position and discomfort.   IMPRESSION:  Perforated colon.   PLAN:  Exploratory laparotomy with possible colostomy versus primary repair  of the sigmoid colon.  I discussed this with the patient and her daughter.  She understands and wishes to proceed.      Alfonse Ras, MD  Electronically Signed     KRE/MEDQ  D:  11/06/2005  T:  11/07/2005  Job:  [id number]

## 2010-06-15 NOTE — Letter (Signed)
April 07, 2006     RE:  DINNA, SEVERS  MRN:  [id number]  /  DOB:  11-Oct-1950   To Whom It May Concern:   This letter is concerning a patient of mine by the name of [name]  (Date of Birth:  11/30/1950).  I am writing this letter to  provide supporting evidence of some ongoing and serious medical problems  that this patient has been experiencing over the past few months that  have made it impossible for her to satisfy some of her financial  obligations.  She has been hospitalized several times over the past few  months, has endured some serious surgical procedures, and has been  dealing with some significant medical problems during this time.  She  has spent a lot of this time in the hospital, and is now recovering at  home.  This has made it impossible to keep up with some of her financial  obligations, and she is just now getting back on her feet again.   I hope you can excuse her from any financial penalties that she may have  occurred during this time of illness.  If I may be of further  assistance, please let me know.    Sincerely,      Tera Mater. Clent Ridges, MD  Electronically Signed    SAF/MedQ  DD: 04/07/2006  DT: 04/07/2006  Job #: [id number]

## 2010-06-15 NOTE — Op Note (Signed)
NAMEGARLENE, APPERSON NO.:  [id number]   MEDICAL RECORD NO.:  [id number]          PATIENT TYPE:  INP   LOCATION:  2303                         FACILITY:  [facility]   PHYSICIAN:  Alfonse Ras, MD   DATE OF BIRTH:  1950/07/23   DATE OF PROCEDURE:  11/06/2005  DATE OF DISCHARGE:                                 OPERATIVE REPORT   PREOPERATIVE DIAGNOSIS:  Perforated colon status post colonoscopy.   POSTOPERATIVE DIAGNOSIS:  Rectosigmoid perforation.   PROCEDURE:  Hartmann resection of sigmoid colon and end colostomy.   SURGEON:  Alfonse Ras, M.D.   ASSISTANT:  Marcy Panning.   ANESTHESIA:  General.   DESCRIPTION OF PROCEDURE:  The patient was taken to the operating room,  placed in the supine position and after adequate general anesthesia was  induced using endotracheal tube the abdomen was prepped and draped in normal  sterile fashion.  Using a vertical midline incision I dissected down to the  fascia.  The fascia was opened vertically.  A number of omental adhesions  were taken down.  In taking these down a small hole was made in the  transverse colon which was quite distended.  There was a significant amount  pneumoperitoneum as well.  The transverse colotomy was closed with  interrupted 2-0 silk sutures.  On mobilizing the sigmoid colon which was  quite densely adhered to the vaginal cuff a very long 6-7 cm longitudinal  tear was identified in the anterior portion of the rectosigmoid.  This was  a very low-lying level.  Because of the patient's significant  comorbidities including obesity, oxygen dependency and insulin dependent  diabetes mellitus,  I opted to resect the area and bring out an end  colostomy.  It was mobilized using the LigaSure and it was stapled using a  green load contour per stapler just distal to the perforation.  A GIA  stapling device was then used proximally to divide the sigmoid colon.  Specimen was removed.   The mesentery was  taken down with a LigaSure and an adequate amount of colon  was mobilized for colostomy.  The abdomen was copiously irrigated.  The  colostomy was brought out through a circular incision and through the  left rectus fascia.  The fascia was then closed with running #1 Novofil.  Skin was closed with staples and colostomy was matured in the standard  fashion using interrupted 3-0 Vicryl pop-off.  The patient was taken to the  intensive care unit in critical condition.      Alfonse Ras, MD  Electronically Signed     KRE/MEDQ  D:  11/06/2005  T:  11/08/2005  Job:  [id number]   cc:   Barbette Hair. Arlyce Dice, MD,FACG  Charlcie Cradle Delford Field, MD, FCCP

## 2011-03-17 ENCOUNTER — Emergency Department (HOSPITAL_COMMUNITY): Payer: Medicare Other

## 2011-03-17 ENCOUNTER — Inpatient Hospital Stay (HOSPITAL_COMMUNITY)
Admission: EM | Admit: 2011-03-17 | Discharge: 2011-03-21 | DRG: 378 | Disposition: A | Discharge status: Home / Self Care | Payer: Medicare Other | Attending: Internal Medicine | Admitting: Internal Medicine

## 2011-03-17 ENCOUNTER — Encounter (HOSPITAL_COMMUNITY): Payer: Self-pay

## 2011-03-17 DIAGNOSIS — J4489 Other specified chronic obstructive pulmonary disease: Secondary | ICD-10-CM | POA: Diagnosis present

## 2011-03-17 DIAGNOSIS — J9610 Chronic respiratory failure, unspecified whether with hypoxia or hypercapnia: Secondary | ICD-10-CM | POA: Diagnosis present

## 2011-03-17 DIAGNOSIS — G8929 Other chronic pain: Secondary | ICD-10-CM | POA: Diagnosis present

## 2011-03-17 DIAGNOSIS — Z85118 Personal history of other malignant neoplasm of bronchus and lung: Secondary | ICD-10-CM

## 2011-03-17 DIAGNOSIS — D61818 Other pancytopenia: Secondary | ICD-10-CM | POA: Diagnosis present

## 2011-03-17 DIAGNOSIS — I708 Atherosclerosis of other arteries: Secondary | ICD-10-CM | POA: Diagnosis present

## 2011-03-17 DIAGNOSIS — Z885 Allergy status to narcotic agent status: Secondary | ICD-10-CM

## 2011-03-17 DIAGNOSIS — D696 Thrombocytopenia, unspecified: Secondary | ICD-10-CM

## 2011-03-17 DIAGNOSIS — D62 Acute posthemorrhagic anemia: Secondary | ICD-10-CM | POA: Diagnosis present

## 2011-03-17 DIAGNOSIS — I10 Essential (primary) hypertension: Secondary | ICD-10-CM | POA: Diagnosis present

## 2011-03-17 DIAGNOSIS — K319 Disease of stomach and duodenum, unspecified: Secondary | ICD-10-CM | POA: Diagnosis present

## 2011-03-17 DIAGNOSIS — D649 Anemia, unspecified: Secondary | ICD-10-CM

## 2011-03-17 DIAGNOSIS — M25519 Pain in unspecified shoulder: Secondary | ICD-10-CM | POA: Diagnosis present

## 2011-03-17 DIAGNOSIS — J449 Chronic obstructive pulmonary disease, unspecified: Secondary | ICD-10-CM | POA: Diagnosis present

## 2011-03-17 DIAGNOSIS — Z880 Allergy status to penicillin: Secondary | ICD-10-CM

## 2011-03-17 DIAGNOSIS — Z860100 Personal history of colon polyps, unspecified: Secondary | ICD-10-CM

## 2011-03-17 DIAGNOSIS — K31819 Angiodysplasia of stomach and duodenum without bleeding: Secondary | ICD-10-CM | POA: Diagnosis present

## 2011-03-17 DIAGNOSIS — F339 Major depressive disorder, recurrent, unspecified: Secondary | ICD-10-CM | POA: Diagnosis present

## 2011-03-17 DIAGNOSIS — E119 Type 2 diabetes mellitus without complications: Secondary | ICD-10-CM

## 2011-03-17 DIAGNOSIS — Z902 Acquired absence of lung [part of]: Secondary | ICD-10-CM

## 2011-03-17 DIAGNOSIS — R109 Unspecified abdominal pain: Secondary | ICD-10-CM | POA: Diagnosis present

## 2011-03-17 DIAGNOSIS — M542 Cervicalgia: Secondary | ICD-10-CM | POA: Diagnosis present

## 2011-03-17 DIAGNOSIS — K766 Portal hypertension: Secondary | ICD-10-CM | POA: Diagnosis present

## 2011-03-17 DIAGNOSIS — Z8541 Personal history of malignant neoplasm of cervix uteri: Secondary | ICD-10-CM

## 2011-03-17 DIAGNOSIS — F3289 Other specified depressive episodes: Secondary | ICD-10-CM | POA: Diagnosis present

## 2011-03-17 DIAGNOSIS — K219 Gastro-esophageal reflux disease without esophagitis: Secondary | ICD-10-CM | POA: Diagnosis present

## 2011-03-17 DIAGNOSIS — F329 Major depressive disorder, single episode, unspecified: Secondary | ICD-10-CM | POA: Diagnosis present

## 2011-03-17 DIAGNOSIS — I700 Atherosclerosis of aorta: Secondary | ICD-10-CM | POA: Diagnosis present

## 2011-03-17 DIAGNOSIS — K5730 Diverticulosis of large intestine without perforation or abscess without bleeding: Secondary | ICD-10-CM | POA: Diagnosis present

## 2011-03-17 DIAGNOSIS — K439 Ventral hernia without obstruction or gangrene: Secondary | ICD-10-CM | POA: Diagnosis present

## 2011-03-17 DIAGNOSIS — C3490 Malignant neoplasm of unspecified part of unspecified bronchus or lung: Secondary | ICD-10-CM | POA: Diagnosis present

## 2011-03-17 DIAGNOSIS — Y929 Unspecified place or not applicable: Secondary | ICD-10-CM

## 2011-03-17 DIAGNOSIS — K922 Gastrointestinal hemorrhage, unspecified: Secondary | ICD-10-CM | POA: Diagnosis present

## 2011-03-17 DIAGNOSIS — K7460 Unspecified cirrhosis of liver: Secondary | ICD-10-CM | POA: Diagnosis present

## 2011-03-17 DIAGNOSIS — E1165 Type 2 diabetes mellitus with hyperglycemia: Secondary | ICD-10-CM | POA: Diagnosis present

## 2011-03-17 DIAGNOSIS — Y999 Unspecified external cause status: Secondary | ICD-10-CM

## 2011-03-17 DIAGNOSIS — Z433 Encounter for attention to colostomy: Secondary | ICD-10-CM

## 2011-03-17 DIAGNOSIS — K589 Irritable bowel syndrome without diarrhea: Secondary | ICD-10-CM | POA: Diagnosis present

## 2011-03-17 DIAGNOSIS — J439 Emphysema, unspecified: Secondary | ICD-10-CM | POA: Diagnosis present

## 2011-03-17 HISTORY — DX: Chronic obstructive pulmonary disease, unspecified: J44.9

## 2011-03-17 HISTORY — DX: Unspecified cirrhosis of liver: K74.60

## 2011-03-17 HISTORY — DX: Cervical disc disorder with radiculopathy, unspecified cervical region: M50.10

## 2011-03-17 HISTORY — DX: Chronic respiratory failure, unspecified whether with hypoxia or hypercapnia: J96.10

## 2011-03-17 HISTORY — DX: Malignant neoplasm of unspecified part of unspecified bronchus or lung: C34.90

## 2011-03-17 HISTORY — DX: Gastro-esophageal reflux disease without esophagitis: K21.9

## 2011-03-17 LAB — COMPREHENSIVE METABOLIC PANEL
Alkaline Phosphatase: 56 U/L (ref 39–117)
BUN: 15 mg/dL (ref 6–23)
GFR calc Af Amer: 67 mL/min — ABNORMAL LOW (ref >90)
Glucose, Bld: 301 mg/dL — ABNORMAL HIGH (ref 70–99)
Potassium: 4.4 mEq/L (ref 3.5–5.1)
Total Bilirubin: 0.2 mg/dL — ABNORMAL LOW (ref 0.3–1.2)
Total Protein: 7 g/dL (ref 6.0–8.3)

## 2011-03-17 LAB — CBC
HCT: 28.5 % — ABNORMAL LOW (ref 36.0–46.0)
Hemoglobin: 9.6 g/dL — ABNORMAL LOW (ref 12.0–15.0)
MCH: 31.9 pg (ref 26.0–34.0)
MCHC: 33.7 g/dL (ref 30.0–36.0)
MCV: 94.7 fL (ref 78.0–100.0)

## 2011-03-17 LAB — HEMOGLOBIN AND HEMATOCRIT, BLOOD
HCT: 25.1 % — ABNORMAL LOW (ref 36.0–46.0)
Hemoglobin: 8.6 g/dL — ABNORMAL LOW (ref 12.0–15.0)

## 2011-03-17 LAB — GLUCOSE, CAPILLARY: Glucose-Capillary: 178 mg/dL — ABNORMAL HIGH (ref 70–99)

## 2011-03-17 LAB — LIPASE, BLOOD: Lipase: 49 U/L (ref 11–59)

## 2011-03-17 LAB — DIFFERENTIAL
Basophils Relative: 0 % (ref 0–1)
Eosinophils Absolute: 0.1 10*3/uL (ref 0.0–0.7)
Lymphocytes Relative: 20 % (ref 12–46)
Lymphs Abs: 0.6 10*3/uL — ABNORMAL LOW (ref 0.7–4.0)
Neutro Abs: 1.9 10*3/uL (ref 1.7–7.7)

## 2011-03-17 MED ORDER — EXENATIDE 5 MCG/0.02ML ~~LOC~~ SOPN
5.0000 ug | PEN_INJECTOR | Freq: Two times a day (BID) | SUBCUTANEOUS | Status: DC
  Filled 2011-03-17 (×2): qty 0.02

## 2011-03-17 MED ORDER — ALBUTEROL SULFATE (5 MG/ML) 0.5% IN NEBU: 2.5000 mg | INHALATION_SOLUTION | RESPIRATORY_TRACT | Status: DC | PRN

## 2011-03-17 MED ORDER — IOHEXOL 300 MG/ML  SOLN
100.0000 mL | Freq: Once | INTRAMUSCULAR | Status: AC | PRN
  Administered 2011-03-17: 100 mL via INTRAVENOUS

## 2011-03-17 MED ORDER — GLIMEPIRIDE 4 MG PO TABS
4.0000 mg | ORAL_TABLET | Freq: Every day | ORAL | Status: DC
  Administered 2011-03-18 – 2011-03-21 (×3): 4 mg via ORAL
  Filled 2011-03-17 (×4): qty 1

## 2011-03-17 MED ORDER — TRAZODONE HCL 100 MG PO TABS
100.0000 mg | ORAL_TABLET | Freq: Every evening | ORAL | Status: DC | PRN
  Filled 2011-03-17: qty 1

## 2011-03-17 MED ORDER — SODIUM CHLORIDE 0.9 % IV SOLN
Freq: Once | INTRAVENOUS | Status: AC
  Administered 2011-03-17: 1000 mL via INTRAVENOUS

## 2011-03-17 MED ORDER — PANTOPRAZOLE SODIUM 40 MG IV SOLR
40.0000 mg | Freq: Every day | INTRAVENOUS | Status: DC
  Administered 2011-03-18 – 2011-03-19 (×2): 40 mg via INTRAVENOUS
  Filled 2011-03-17 (×4): qty 40

## 2011-03-17 MED ORDER — DICYCLOMINE HCL 10 MG PO CAPS
10.0000 mg | ORAL_CAPSULE | Freq: Three times a day (TID) | ORAL | Status: DC
  Administered 2011-03-17 – 2011-03-21 (×14): 10 mg via ORAL
  Filled 2011-03-17 (×17): qty 1

## 2011-03-17 MED ORDER — FOLIC ACID 1 MG PO TABS
1.0000 mg | ORAL_TABLET | Freq: Every day | ORAL | Status: DC
  Administered 2011-03-18 – 2011-03-21 (×4): 1 mg via ORAL
  Filled 2011-03-17 (×4): qty 1

## 2011-03-17 MED ORDER — INSULIN ASPART 100 UNIT/ML ~~LOC~~ SOLN
0.0000 [IU] | SUBCUTANEOUS | Status: DC
  Administered 2011-03-18: 3 [IU] via SUBCUTANEOUS
  Administered 2011-03-18: 1 [IU] via SUBCUTANEOUS
  Administered 2011-03-18: 2 [IU] via SUBCUTANEOUS
  Administered 2011-03-19: 8 [IU] via SUBCUTANEOUS
  Administered 2011-03-20: 3 [IU] via SUBCUTANEOUS
  Administered 2011-03-21 (×3): 2 [IU] via SUBCUTANEOUS
  Filled 2011-03-17: qty 3

## 2011-03-17 MED ORDER — HYDROMORPHONE HCL PF 1 MG/ML IJ SOLN
INTRAMUSCULAR | Status: AC
  Filled 2011-03-17: qty 2

## 2011-03-17 MED ORDER — POTASSIUM CHLORIDE IN NACL 20-0.9 MEQ/L-% IV SOLN
INTRAVENOUS | Status: DC
  Administered 2011-03-17 – 2011-03-19 (×3): via INTRAVENOUS
  Administered 2011-03-19: 100 mL/h via INTRAVENOUS
  Administered 2011-03-20 – 2011-03-21 (×4): via INTRAVENOUS
  Filled 2011-03-17 (×11): qty 1000

## 2011-03-17 MED ORDER — IPRATROPIUM BROMIDE 0.02 % IN SOLN: 0.5000 mg | RESPIRATORY_TRACT | Status: DC | PRN

## 2011-03-17 MED ORDER — HYDROMORPHONE HCL PF 1 MG/ML IJ SOLN
2.0000 mg | INTRAMUSCULAR | Status: DC | PRN
  Administered 2011-03-17 – 2011-03-18 (×3): 2 mg via INTRAVENOUS
  Filled 2011-03-17: qty 1
  Filled 2011-03-17: qty 2
  Filled 2011-03-17 (×3): qty 1

## 2011-03-17 MED ORDER — RIFAXIMIN 550 MG PO TABS
550.0000 mg | ORAL_TABLET | Freq: Two times a day (BID) | ORAL | Status: DC
  Administered 2011-03-17 – 2011-03-21 (×8): 550 mg via ORAL
  Filled 2011-03-17 (×9): qty 1

## 2011-03-17 MED ORDER — OXYCODONE HCL 5 MG PO TABS
15.0000 mg | ORAL_TABLET | Freq: Four times a day (QID) | ORAL | Status: DC | PRN
  Administered 2011-03-17 – 2011-03-18 (×2): 15 mg via ORAL
  Filled 2011-03-17 (×2): qty 3

## 2011-03-17 MED ORDER — HYDROMORPHONE HCL PF 1 MG/ML IJ SOLN
1.0000 mg | Freq: Once | INTRAMUSCULAR | Status: AC
  Administered 2011-03-17: 1 mg via INTRAVENOUS
  Filled 2011-03-17: qty 1

## 2011-03-17 MED ORDER — TOLTERODINE TARTRATE ER 2 MG PO CP24
2.0000 mg | ORAL_CAPSULE | Freq: Every day | ORAL | Status: DC
  Administered 2011-03-18 – 2011-03-21 (×4): 2 mg via ORAL
  Filled 2011-03-17 (×4): qty 1

## 2011-03-17 MED ORDER — FLUTICASONE-SALMETEROL 250-50 MCG/DOSE IN AEPB
1.0000 | INHALATION_SPRAY | Freq: Two times a day (BID) | RESPIRATORY_TRACT | Status: DC
  Administered 2011-03-18 – 2011-03-21 (×8): 1 via RESPIRATORY_TRACT
  Filled 2011-03-17: qty 14

## 2011-03-17 MED ORDER — ONDANSETRON HCL 4 MG/2ML IJ SOLN
4.0000 mg | Freq: Once | INTRAMUSCULAR | Status: AC
  Administered 2011-03-17: 4 mg via INTRAVENOUS
  Filled 2011-03-17: qty 2

## 2011-03-17 MED ORDER — DIAZEPAM 5 MG PO TABS
5.0000 mg | ORAL_TABLET | Freq: Three times a day (TID) | ORAL | Status: DC | PRN
  Administered 2011-03-20 – 2011-03-21 (×3): 5 mg via ORAL
  Filled 2011-03-17 (×3): qty 1

## 2011-03-17 MED ORDER — METFORMIN HCL 500 MG PO TABS
1000.0000 mg | ORAL_TABLET | Freq: Two times a day (BID) | ORAL | Status: DC
  Administered 2011-03-18 – 2011-03-21 (×5): 1000 mg via ORAL
  Filled 2011-03-17 (×9): qty 2

## 2011-03-17 MED ORDER — METOCLOPRAMIDE HCL 10 MG PO TABS
10.0000 mg | ORAL_TABLET | Freq: Three times a day (TID) | ORAL | Status: DC
  Administered 2011-03-18 – 2011-03-21 (×9): 10 mg via ORAL
  Filled 2011-03-17 (×12): qty 1

## 2011-03-17 NOTE — ED Notes (Signed)
I [name] didn't put in the note

## 2011-03-17 NOTE — ED Notes (Signed)
Contacted lab in regards to platelets.  Lab advised that we don't have any platelets will have to come from [facility].

## 2011-03-17 NOTE — ED Notes (Signed)
Lab called and stated that the platelets need to come from [facility]

## 2011-03-17 NOTE — ED Provider Notes (Signed)
History     CSN: [id number]  Arrival date & time 03/17/11  1614   First MD Initiated Contact with Patient 03/17/11 1737      Chief Complaint  Patient presents with  . GI Bleeding  . Abdominal Pain  . Nausea    (Consider location/radiation/quality/duration/timing/severity/associated sxs/prior treatment) Patient is a 61 y.o. female presenting with abdominal pain. The history is provided by the patient.  Abdominal Pain The primary symptoms of the illness include abdominal pain.  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 She has mild to moderate abdominal pain on a chronic basis. This morning, her abdominal pain increased. It is severe and she rates it at 10 out of 10. It is worse with palpation. Nothing makes it better. She is also noted of blood going into her colostomy including clots. She's had mild nausea but no vomiting. She denies fever, chills, sweats. She is generally weak had any dizziness. She had blood work done a week ago which showed decreased platelets of 50,000.  Past Medical History  Diagnosis Date  . Cirrhosis   . Diabetes mellitus   . GERD (gastroesophageal reflux disease)   . Cervical disc syndrome   . COPD (chronic obstructive pulmonary disease)   . Asthma     History reviewed. No pertinent past surgical history.  No family history on file.  History  Substance Use Topics  . Smoking status: Former Games developer  . Smokeless tobacco: Not on file  . Alcohol Use: No    OB History    Grav Para Term Preterm Abortions TAB SAB Ect Mult Living                  Review of Systems  Gastrointestinal: Positive for abdominal pain.  All other systems reviewed and are negative.    Allergies  Cephalexin; Codeine phosphate; Hydrocodone-acetaminophen; Morphine; Morphine sulfate; and Penicillins  Home Medications   Current Outpatient Rx  Name Route Sig Dispense Refill  .  IPRATROPIUM-ALBUTEROL 18-103 MCG/ACT IN AERO Inhalation Inhale 2 puffs into the lungs every 4 (four) hours as needed. For asthma    . VITAMIN D 1000 UNITS PO TABS Oral Take 1,000 Units by mouth daily.    [name] DIAZEPAM 5 MG PO TABS Oral Take 5 mg by mouth every 8 (eight) hours as needed. For anxiety    . DICYCLOMINE HCL 10 MG PO CAPS Oral Take 10 mg by mouth 4 (four) times daily -  before meals and at bedtime.    [name] ESOMEPRAZOLE MAGNESIUM 40 MG PO CPDR Oral Take 40 mg by mouth daily before breakfast.    . EXENATIDE 5 MCG/0.02ML [place] SOLN Subcutaneous Inject 5 mcg into the skin 2 (two) times daily with a meal.    . FLUTICASONE-SALMETEROL 250-50 MCG/DOSE IN AEPB Inhalation Inhale 1 puff into the lungs every 12 (twelve) hours.    [name] FOLIC ACID 1 MG PO TABS Oral Take 1 mg by mouth daily.    . FUROSEMIDE 20 MG PO TABS Oral Take 20 mg by mouth 2 (two) times daily.    [name] GLIMEPIRIDE 4 MG PO TABS Oral Take 4 mg by mouth daily before breakfast.    . METFORMIN HCL 500 MG PO TABS Oral Take 1,000 mg by mouth 2 (two) times daily with a meal.    . METOCLOPRAMIDE HCL 10 MG PO TABS Oral Take 10 mg by mouth 3 (three) times daily.    [name]  OXYCODONE HCL 15 MG PO TABS Oral Take 15 mg by mouth every 6 (six) hours as needed. For pain relief    . PROMETHAZINE HCL 25 MG PO TABS Oral Take 25 mg by mouth every 6 (six) hours as needed. For nausea    . RIFAXIMIN 550 MG PO TABS Oral Take 550 mg by mouth 2 (two) times daily.    . TOLTERODINE TARTRATE ER 2 MG PO CP24 Oral Take 2 mg by mouth daily.    . TRAZODONE HCL 100 MG PO TABS Oral Take 100 mg by mouth at bedtime.      BP 135/89  Pulse 93  Temp(Src) 98.4 F (36.9 C) (Oral)  Resp 20  SpO2 100%  Physical Exam  Nursing note and vitals reviewed.  61 year old female appears uncomfortable. Vital signs are significant for mild hypertension with blood pressure 157/78. Oxygen saturation is 97% which is normal. Head is normocephalic and atraumatic. PERRLA, EOMI. There is no scleral  icterus. Mucous members are moist. Neck is nontender and supple without adenopathy or JVD. Lungs are clear without rales, wheezes, or rhonchi. Heart has regular rate and rhythm without murmur. Abdomen is slightly distended and soft with moderate tenderness diffusely. Colostomy is present in the left lower quadrant with gross blood present. There is no rebound or guarding. She is having too much tenderness to evaluate thoroughly for hepatosplenomegaly. Extremities have 1+ edema without cyanosis. Skin is warm and dry without rash. Neurologic: Mental status is normal, cranial nerves are intact, there no focal motor or sensory deficits.  ED Course  Procedures (including critical care time)  Results for orders placed during the hospital encounter of 03/17/11  CBC      Component Value Range   WBC 2.9 (*) 4.0 - 10.5 (K/uL)   RBC 3.01 (*) 3.87 - 5.11 (MIL/uL)   Hemoglobin 9.6 (*) 12.0 - 15.0 (g/dL)   HCT 30.8 (*) 65.7 - 46.0 (%)   MCV 94.7  78.0 - 100.0 (fL)   MCH 31.9  26.0 - 34.0 (pg)   MCHC 33.7  30.0 - 36.0 (g/dL)   RDW 84.6  96.2 - 95.2 (%)   Platelets 62 (*) 150 - 400 (K/uL)  DIFFERENTIAL      Component Value Range   Neutrophils Relative 66  43 - 77 (%)   Lymphocytes Relative 20  12 - 46 (%)   Monocytes Relative 9  3 - 12 (%)   Eosinophils Relative 5  0 - 5 (%)   Basophils Relative 0  0 - 1 (%)   Neutro Abs 1.9  1.7 - 7.7 (K/uL)   Lymphs Abs 0.6 (*) 0.7 - 4.0 (K/uL)   Monocytes Absolute 0.3  0.1 - 1.0 (K/uL)   Eosinophils Absolute 0.1  0.0 - 0.7 (K/uL)   Basophils Absolute 0.0  0.0 - 0.1 (K/uL)   Smear Review MORPHOLOGY UNREMARKABLE    COMPREHENSIVE METABOLIC PANEL      Component Value Range   Sodium 136  135 - 145 (mEq/L)   Potassium 4.4  3.5 - 5.1 (mEq/L)   Chloride 103  96 - 112 (mEq/L)   CO2 22  19 - 32 (mEq/L)   Glucose, Bld 301 (*) 70 - 99 (mg/dL)   BUN 15  6 - 23 (mg/dL)   Creatinine, Ser 8.41  0.50 - 1.10 (mg/dL)   Calcium 9.3  8.4 - 32.4 (mg/dL)   Total Protein 7.0  6.0  - 8.3 (g/dL)   Albumin 3.8  3.5 - 5.2 (g/dL)  AST 48 (*) 0 - 37 (U/L)   ALT 54 (*) 0 - 35 (U/L)   Alkaline Phosphatase 56  39 - 117 (U/L)   Total Bilirubin 0.2 (*) 0.3 - 1.2 (mg/dL)   GFR calc non Af Amer 58 (*) >90 (mL/min)   GFR calc Af Amer 67 (*) >90 (mL/min)  LIPASE, BLOOD      Component Value Range   Lipase 49  11 - 59 (U/L)  PROTIME-INR      Component Value Range   Prothrombin Time 13.4  11.6 - 15.2 (seconds)   INR 1.00  0.00 - 1.49   TYPE AND SCREEN      Component Value Range   ABO/RH(D) A POS     Antibody Screen NEG     Sample Expiration 03/20/2011    ABO/RH      Component Value Range   ABO/RH(D) A POS     Dg Abd Acute W/chest  03/17/2011  *RADIOLOGY REPORT*  Clinical Data: Gastrointestinal bleeding.  Abdominal pain.  Nausea.  ACUTE ABDOMEN SERIES (ABDOMEN 2 VIEW & CHEST 1 VIEW)  Comparison: Chest radiograph 11/09/2005.  CT abdomen 04/03/2005.  Findings: Cardiopericardial silhouette appears within normal limits.  Pulmonary parenchymal scarring is present in a basilar and perihilar distribution which appears unchanged compared to the prior exam.  Suboptimal evaluation of the abdomen due to obese body habitus.  The right abdominal wall is clipped. Cholecystectomy clips are present in the right upper quadrant.  Stomach is distended with food.  The bowel gas is nonobstructive.  There is no gas identified in the rectosigmoid, but reportedly the patient has an ostomy.  Ostomy appliance is not identified radiographically. There is no plain film evidence of free air on decubitus imaging.  IMPRESSION:  1.  Chronic changes of the chest without acute cardiopulmonary disease. 2.  No acute abnormality in the abdomen.  Overall nonobstructive bowel gas pattern allowing for reported presence of ostomy. 3.  Technically suboptimal study secondary to body habitus.  Original Report Authenticated By: Andreas Newport, M.D.     she has gotten hydromorphone for pain, ondansetron for nausea, and IV  fluids. Workup shows only slight drop in hemoglobin, normal coagulation studies, and only minimal elevation of liver enzymes. Consultation has been obtained with Dr. Joneen [name] [facility] who will see the patient to arrange admission. Gastroenterology consultation will also be obtained.  1. GI bleed   2. Abdominal pain   3. Cirrhosis of liver       MDM  I have reviewed the blood work that she brought with her. It was drawn in February 13 and WBC was 2.6, hemoglobin 10.4, platelet count 50,000. Her reviewed her old records and she has had problems with bleeding through her colostomy in the past. She is currently residing in [facility] and her gastroenterologist is there. She will need to be evaluated for a platelet count and INR to see if there is a coagulopathy which needs to be corrected.        Dione Booze, MD 03/17/11 [id number]

## 2011-03-17 NOTE — ED Notes (Signed)
Patient transported to CT 

## 2011-03-17 NOTE — ED Notes (Addendum)
Patient reports that she developed frank red blood this am in ostomy bag-reports that she has developed pain to same. Reports that her [platelets were 50 this past week, reports stage 4 cirrhosis, reports abdominal pain increasing

## 2011-03-17 NOTE — ED Notes (Signed)
Pt reports she had a colonoscopy gone wrong with resulting diverticulitis and now has an ostomy bag since 2007. Pt has seen blood in ostomy today. Pt reports history of stage four liver cirrhosis and an enlarged spleen.  Pt reports blood clots in ostomy and generalized abdominal pain.  Pt also having nausea, but no vomiting.

## 2011-03-17 NOTE — H&P (Signed)
PCP:   [name], MD, MD   Chief Complaint:  Blooding ostomy bag and abdominal pain  HPI: This is a 61 year old female who has a colostomy. Colostomy placed as patient had a perforation from diverticulitis. Patient states over the last 6 months she seen some blood in her ostomy bag, small intermittence occurrences. She also has chronic abdominal pain, which resulted in her having a EGD/colostomy/ostomy imaging four months ago. Only polyps found. She is here from [place] visiting her daughter. Today she developed severe abdominal pain, all across her abdomen, worse in the epigastric region. She states this is her chronic pain, just significantly worse. She also started bleeding in her ostomy bag -liquid dark red blood. she states that she initially filled the bag, which he changed but has continued to bleed. She believes the bleeding appeared to be coming from around to the ostomy site. She came to the ER. The patient has a history of liver cirrhosis (not alcohol related) and chronic thrombocytopenia. Her most recent platelet count was 50, here and the ER her platelet is 62.  The patient's pain is described as sharp occasionally crampy. She reports chills and nausea but no vomiting. She's not on the transplant list, given her multiple comorbid issues, she's not considered a candidate. History provided by the patient was alert and oriented but quite anxious. Her daughters at the bedside. With her recent colonoscopy, patient say she tolerated her anesthesia without issues.  Review of Systems: Positives bolded   anorexia, fever, weight loss,, vision loss, decreased hearing, hoarseness, chest pain, syncope, dyspnea on exertion, peripheral edema, balance deficits, hemoptysis, abdominal pain, melena, hematochezia, severe indigestion/heartburn, hematuria, incontinence, genital sores, muscle weakness, suspicious skin lesions, transient blindness, difficulty walking, depression, unusual weight change,  abnormal bleeding, enlarged lymph nodes, angioedema, and breast masses.  Past Medical History: Past Medical History  Diagnosis Date  . Cirrhosis   . Diabetes mellitus   . GERD (gastroesophageal reflux disease)   . Cervical disc syndrome   . COPD (chronic obstructive pulmonary disease)   . Lung cancer   . Chronic respiratory failure    Past Surgical History  Procedure Date  . Appendectomy   . Cholecystectomy   . Colostomy   . Pneumonectomy   . Cervix removed     Medications: Prior to Admission medications   Medication Sig Start Date End Date Taking? Authorizing Provider  albuterol-ipratropium (COMBIVENT) 18-103 MCG/ACT inhaler Inhale 2 puffs into the lungs every 4 (four) hours as needed. For asthma   Yes Historical Provider, MD  cholecalciferol (VITAMIN D) 1000 UNITS tablet Take 1,000 Units by mouth daily.   Yes Historical Provider, MD  diazepam (VALIUM) 5 MG tablet Take 5 mg by mouth every 8 (eight) hours as needed. For anxiety   Yes Historical Provider, MD  dicyclomine (BENTYL) 10 MG capsule Take 10 mg by mouth 4 (four) times daily -  before meals and at bedtime.   Yes Historical Provider, MD  esomeprazole (NEXIUM) 40 MG capsule Take 40 mg by mouth daily before breakfast.   Yes Historical Provider, MD  exenatide (BYETTA) 5 MCG/0.02ML SOLN Inject 5 mcg into the skin 2 (two) times daily with a meal.   Yes Historical Provider, MD  Fluticasone-Salmeterol (ADVAIR) 250-50 MCG/DOSE AEPB Inhale 1 puff into the lungs every 12 (twelve) hours.   Yes Historical Provider, MD  folic acid (FOLVITE) 1 MG tablet Take 1 mg by mouth daily.   Yes Historical Provider, MD  furosemide (LASIX) 20 MG tablet Take 20  mg by mouth 2 (two) times daily.   Yes Historical Provider, MD  glimepiride (AMARYL) 4 MG tablet Take 4 mg by mouth daily before breakfast.   Yes Historical Provider, MD  metFORMIN (GLUCOPHAGE) 500 MG tablet Take 1,000 mg by mouth 2 (two) times daily with a meal.   Yes Historical Provider, MD    metoCLOPramide (REGLAN) 10 MG tablet Take 10 mg by mouth 3 (three) times daily.   Yes Historical Provider, MD  oxyCODONE (ROXICODONE) 15 MG immediate release tablet Take 15 mg by mouth every 6 (six) hours as needed. For pain relief   Yes Historical Provider, MD  promethazine (PHENERGAN) 25 MG tablet Take 25 mg by mouth every 6 (six) hours as needed. For nausea   Yes Historical Provider, MD  rifaximin (XIFAXAN) 550 MG TABS Take 550 mg by mouth 2 (two) times daily.   Yes Historical Provider, MD  tolterodine (DETROL LA) 2 MG 24 hr capsule Take 2 mg by mouth daily.   Yes Historical Provider, MD  traZODone (DESYREL) 100 MG tablet Take 100 mg by mouth at bedtime.   Yes Historical Provider, MD    Allergies:   Allergies  Allergen Reactions  . Cephalexin     REACTION: unspecified  . Codeine Phosphate     REACTION: unspecified  . Hydrocodone-Acetaminophen     REACTION: unspecified  . Morphine   . Morphine Sulfate     REACTION: unspecified  . Penicillins     REACTION: unspecified    Social History:  reports that she has quit smoking. She does not have any smokeless tobacco history on file. She reports that she does not drink alcohol or use illicit drugs. home oxygen  Family History: Family History  Problem Relation Age of Onset  . Coronary artery disease    . Diabetes type II      Physical Exam: Filed Vitals:   03/17/11 1629 03/17/11 1644  BP: 157/78 135/89  Pulse: 99 93  Temp: 98.6 F (37 C) 98.4 F (36.9 C)  TempSrc:  Oral  Resp: 18 20  SpO2: 97% 100%    General:  Alert and oriented times three, well developed and nourished, no acute distress Eyes: PERRLA, pink conjunctiva, no scleral icterus ENT: Moist oral mucosa, neck supple, no thyromegaly Lungs: clear to ascultation, no wheeze, no crackles, no use of accessory muscles Cardiovascular: regular rate and rhythm, no regurgitation, no gallops, no murmurs. No carotid bruits, no JVD Abdomen: soft, positive BS, generalized  tenderness to palpation greatest in the epigastric region, distended, unable to assess organomegaly, colostomy right upper quadrant, bag with dark liquid blood GU: not examined Neuro: CN II - XII grossly intact, sensation intact Musculoskeletal: strength 5/5 all extremities, no clubbing, cyanosis or edema Skin: no rash, no subcutaneous crepitation, no decubitus Psych: appropriate patient   Labs on Admission:   [facility] 03/17/11 1738  NA 136  K 4.4  CL 103  CO2 22  GLUCOSE 301*  BUN 15  CREATININE 1.04  CALCIUM 9.3  MG --  PHOS --    Basename 03/17/11 1738  AST 48*  ALT 54*  ALKPHOS 56  BILITOT 0.2*  PROT 7.0  ALBUMIN 3.8  Results for LILLYAUNA, JENKINSON (MRN [id number]) as of 03/17/2011 20:53  Ref. Range 03/17/2011 17:38  Prothrombin Time Latest Range: 11.6-15.2 seconds 13.4  INR Latest Range: 0.00-1.49  1.00    Basename 03/17/11 1738  LIPASE 49  AMYLASE --    Basename 03/17/11 1738  WBC 2.9*  NEUTROABS  1.9  HGB 9.6*  HCT 28.5*  MCV 94.7  PLT 62*   No results found for this basename: CKTOTAL:3,CKMB:3,CKMBINDEX:3,TROPONINI:3 in the last 72 hours No components found with this basename: POCBNP:3 No results found for this basename: DDIMER:2 in the last 72 hours No results found for this basename: HGBA1C:2 in the last 72 hours No results found for this basename: CHOL:2,HDL:2,LDLCALC:2,TRIG:2,CHOLHDL:2,LDLDIRECT:2 in the last 72 hours No results found for this basename: TSH,T4TOTAL,FREET3,T3FREE,THYROIDAB in the last 72 hours No results found for this basename: VITAMINB12:2,FOLATE:2,FERRITIN:2,TIBC:2,IRON:2,RETICCTPCT:2 in the last 72 hours  Micro Results: No results found for this or any previous visit (from the past 240 hour(s)).   Radiological Exams on Admission: Dg Abd Acute W/chest  03/17/2011  *RADIOLOGY REPORT*  Clinical Data: Gastrointestinal bleeding.  Abdominal pain.  Nausea.  ACUTE ABDOMEN SERIES (ABDOMEN 2 VIEW & CHEST 1 VIEW)  Comparison: Chest  radiograph 11/09/2005.  CT abdomen 04/03/2005.  Findings: Cardiopericardial silhouette appears within normal limits.  Pulmonary parenchymal scarring is present in a basilar and perihilar distribution which appears unchanged compared to the prior exam.  Suboptimal evaluation of the abdomen due to obese body habitus.  The right abdominal wall is clipped. Cholecystectomy clips are present in the right upper quadrant.  Stomach is distended with food.  The bowel gas is nonobstructive.  There is no gas identified in the rectosigmoid, but reportedly the patient has an ostomy.  Ostomy appliance is not identified radiographically. There is no plain film evidence of free air on decubitus imaging.  IMPRESSION:  1.  Chronic changes of the chest without acute cardiopulmonary disease. 2.  No acute abnormality in the abdomen.  Overall nonobstructive bowel gas pattern allowing for reported presence of ostomy. 3.  Technically suboptimal study secondary to body habitus.  Original Report Authenticated By: Andreas Newport, M.D.    Assessment/Plan Present on Admission:  .Acute GI bleeding from ostomy Liver cirrhosis Thrombocytopenia Admit to MedSurg  Serial H&H's ordered N.p.o., IV fluids, IV Protonix Bleeding likely due to thrombocytopenia will transfuse, posttransfusion CBC. Patient also with history of diverticulosis. GI consulted for possible colonoscopy through ostomy Will obtain records for patient's most recent colostomy and EGD from Dr. [name] (p) [phone], ([phone] CT abdomen and pelvis ordered, as patient with worsening abdominal pain. Rule out intramuscular bleeding .COPD Chronic respiratory failure  .CARCINOMA, LUNG, SQUAMOUS CELL .DIABETES MELLITUS, TYPE II .HYPERTENSION .DEPRESSION  stable resume home medications   Full code SCDs for DVT prophylaxis Team 3/Dr. Lorel Monaco, Eyan Hagood 03/17/2011, 8:45 PM

## 2011-03-18 DIAGNOSIS — R109 Unspecified abdominal pain: Secondary | ICD-10-CM

## 2011-03-18 DIAGNOSIS — K922 Gastrointestinal hemorrhage, unspecified: Principal | ICD-10-CM

## 2011-03-18 DIAGNOSIS — K7460 Unspecified cirrhosis of liver: Secondary | ICD-10-CM

## 2011-03-18 LAB — GLUCOSE, CAPILLARY
Glucose-Capillary: 128 mg/dL — ABNORMAL HIGH (ref 70–99)
Glucose-Capillary: 141 mg/dL — ABNORMAL HIGH (ref 70–99)
Glucose-Capillary: 88 mg/dL (ref 70–99)

## 2011-03-18 LAB — BASIC METABOLIC PANEL
Calcium: 8.7 mg/dL (ref 8.4–10.5)
Chloride: 109 mEq/L (ref 96–112)
Creatinine, Ser: 1.06 mg/dL (ref 0.50–1.10)
GFR calc Af Amer: 65 mL/min — ABNORMAL LOW (ref >90)
Sodium: 142 mEq/L (ref 135–145)

## 2011-03-18 LAB — CBC
Platelets: 52 10*3/uL — ABNORMAL LOW (ref 150–400)
RBC: 2.65 MIL/uL — ABNORMAL LOW (ref 3.87–5.11)
RDW: 14.3 % (ref 11.5–15.5)
WBC: 2 10*3/uL — ABNORMAL LOW (ref 4.0–10.5)

## 2011-03-18 LAB — AMMONIA: Ammonia: 41 umol/L (ref 11–60)

## 2011-03-18 LAB — MRSA PCR SCREENING: MRSA by PCR: NEGATIVE

## 2011-03-18 LAB — PROTIME-INR: INR: 1.2 (ref 0.00–1.49)

## 2011-03-18 MED ORDER — POLYETHYLENE GLYCOL 3350 17 GM/SCOOP PO POWD
1.0000 | Freq: Once | ORAL | Status: AC
  Administered 2011-03-19: 0.5 via ORAL
  Filled 2011-03-18 (×2): qty 255

## 2011-03-18 MED ORDER — POLYETHYLENE GLYCOL 3350 17 GM/SCOOP PO POWD
1.0000 | Freq: Once | ORAL | Status: AC
  Administered 2011-03-18: 1 via ORAL
  Filled 2011-03-18: qty 255

## 2011-03-18 MED ORDER — HYDROMORPHONE HCL PF 2 MG/ML IJ SOLN
2.0000 mg | INTRAMUSCULAR | Status: DC | PRN
  Administered 2011-03-18 – 2011-03-21 (×20): 2 mg via INTRAVENOUS
  Filled 2011-03-18: qty 1
  Filled 2011-03-18 (×3): qty 2
  Filled 2011-03-18 (×7): qty 1
  Filled 2011-03-18: qty 2
  Filled 2011-03-18: qty 1
  Filled 2011-03-18: qty 2
  Filled 2011-03-18 (×6): qty 1

## 2011-03-18 NOTE — Progress Notes (Signed)
Patient ID: [name], female   DOB: 12-17-1950, 61 y.o.   MRN: [id number] Subjective: Patient seen. Complain of epigastric discomfort. No bleeding from the colostomy back.  Objective: Weight change:   Intake/Output Summary (Last 24 hours) at 03/18/11 1348 Last data filed at 03/18/11 1021  Gross per 24 hour  Intake 688.84 ml  Output   1200 ml  Net -511.16 ml   BP 149/66  Pulse 72  Temp(Src) 98.3 F (36.8 C) (Oral)  Resp 18  Ht 5' (1.524 m)  Wt 83.6 kg (184 lb 4.9 oz)  BMI 35.99 kg/m2  SpO2 98% Physical Exam: General appearance: alert, cooperative and not in distress, pallor Head: Normocephalic, without obvious abnormality, atraumatic Neck: no adenopathy, no carotid bruit, no JVD, supple, symmetrical, trachea midline and thyroid not enlarged, symmetric, no tenderness/mass/nodules Lungs: clear to auscultation bilaterally Heart: regular rate and rhythm, S1, S2 normal, no murmur, click, rub or gallop Abdomen: soft, surgical scar, tenderness in the epigastrium, no guarding, colostomy bag in situ, no organomegaly, both sounds are positive. Extremities: extremities normal, atraumatic, no cyanosis or edema Skin: Slightly decreased turgor.  Lab Results: Results for orders placed during the hospital encounter of 03/17/11 (from the past 48 hour(s))  CBC     Status: Abnormal   Collection Time   03/17/11  5:38 PM      Component Value Range Comment   WBC 2.9 (*) 4.0 - 10.5 (K/uL)    RBC 3.01 (*) 3.87 - 5.11 (MIL/uL)    Hemoglobin 9.6 (*) 12.0 - 15.0 (g/dL)    HCT 14.7 (*) 82.9 - 46.0 (%)    MCV 94.7  78.0 - 100.0 (fL)    MCH 31.9  26.0 - 34.0 (pg)    MCHC 33.7  30.0 - 36.0 (g/dL)    RDW 56.2  13.0 - 86.5 (%)    Platelets 62 (*) 150 - 400 (K/uL)   DIFFERENTIAL     Status: Abnormal   Collection Time   03/17/11  5:38 PM      Component Value Range Comment   Neutrophils Relative 66  43 - 77 (%)    Lymphocytes Relative 20  12 - 46 (%)    Monocytes Relative 9  3 - 12 (%)    Eosinophils Relative 5  0 - 5 (%)    Basophils Relative 0  0 - 1 (%)    Neutro Abs 1.9  1.7 - 7.7 (K/uL)    Lymphs Abs 0.6 (*) 0.7 - 4.0 (K/uL)    Monocytes Absolute 0.3  0.1 - 1.0 (K/uL)    Eosinophils Absolute 0.1  0.0 - 0.7 (K/uL)    Basophils Absolute 0.0  0.0 - 0.1 (K/uL)    Smear Review MORPHOLOGY UNREMARKABLE     COMPREHENSIVE METABOLIC PANEL     Status: Abnormal   Collection Time   03/17/11  5:38 PM      Component Value Range Comment   Sodium 136  135 - 145 (mEq/L)    Potassium 4.4  3.5 - 5.1 (mEq/L)    Chloride 103  96 - 112 (mEq/L)    CO2 22  19 - 32 (mEq/L)    Glucose, Bld 301 (*) 70 - 99 (mg/dL)    BUN 15  6 - 23 (mg/dL)    Creatinine, Ser 7.84  0.50 - 1.10 (mg/dL)    Calcium 9.3  8.4 - 10.5 (mg/dL)    Total Protein 7.0  6.0 - 8.3 (g/dL)    Albumin 3.8  3.5 - 5.2 (g/dL)    AST 48 (*) 0 - 37 (U/L)    ALT 54 (*) 0 - 35 (U/L)    Alkaline Phosphatase 56  39 - 117 (U/L)    Total Bilirubin 0.2 (*) 0.3 - 1.2 (mg/dL)    GFR calc non Af Amer 58 (*) >90 (mL/min)    GFR calc Af Amer 67 (*) >90 (mL/min)   LIPASE, BLOOD     Status: Normal   Collection Time   03/17/11  5:38 PM      Component Value Range Comment   Lipase 49  11 - 59 (U/L)   PROTIME-INR     Status: Normal   Collection Time   03/17/11  5:38 PM      Component Value Range Comment   Prothrombin Time 13.4  11.6 - 15.2 (seconds)    INR 1.00  0.00 - 1.49    TYPE AND SCREEN     Status: Normal   Collection Time   03/17/11  6:23 PM      Component Value Range Comment   ABO/RH(D) A POS      Antibody Screen NEG      Sample Expiration 03/20/2011     ABO/RH     Status: Normal   Collection Time   03/17/11  6:23 PM      Component Value Range Comment   ABO/RH(D) A POS     PREPARE PLATELET PHERESIS     Status: Normal (Preliminary result)   Collection Time   03/17/11  8:30 PM      Component Value Range Comment   Unit Number [id number]      Blood Component Type PLTPHER LR3      Unit division 00      Status of Unit ISSUED        Transfusion Status OK TO TRANSFUSE     GLUCOSE, CAPILLARY     Status: Abnormal   Collection Time   03/17/11 10:00 PM      Component Value Range Comment   Glucose-Capillary 178 (*) 70 - 99 (mg/dL)    Comment 1 Notify RN     HEMOGLOBIN AND HEMATOCRIT, BLOOD     Status: Abnormal   Collection Time   03/17/11 10:45 PM      Component Value Range Comment   Hemoglobin 8.6 (*) 12.0 - 15.0 (g/dL)    HCT 47.8 (*) 29.5 - 46.0 (%)   GLUCOSE, CAPILLARY     Status: Abnormal   Collection Time   03/18/11  1:49 AM      Component Value Range Comment   Glucose-Capillary 128 (*) 70 - 99 (mg/dL)   BASIC METABOLIC PANEL     Status: Abnormal   Collection Time   03/18/11  5:16 AM      Component Value Range Comment   Sodium 142  135 - 145 (mEq/L)    Potassium 4.2  3.5 - 5.1 (mEq/L)    Chloride 109  96 - 112 (mEq/L)    CO2 26  19 - 32 (mEq/L)    Glucose, Bld 132 (*) 70 - 99 (mg/dL)    BUN 12  6 - 23 (mg/dL)    Creatinine, Ser 6.21  0.50 - 1.10 (mg/dL)    Calcium 8.7  8.4 - 10.5 (mg/dL)    GFR calc non Af Amer 56 (*) >90 (mL/min)    GFR calc Af Amer 65 (*) >90 (mL/min)   CBC     Status: Abnormal   Collection  Time   03/18/11  5:16 AM      Component Value Range Comment   WBC 2.0 (*) 4.0 - 10.5 (K/uL)    RBC 2.65 (*) 3.87 - 5.11 (MIL/uL)    Hemoglobin 8.4 (*) 12.0 - 15.0 (g/dL)    HCT 40.9 (*) 81.1 - 46.0 (%)    MCV 94.0  78.0 - 100.0 (fL)    MCH 31.7  26.0 - 34.0 (pg)    MCHC 33.7  30.0 - 36.0 (g/dL)    RDW 91.4  78.2 - 95.6 (%)    Platelets 52 (*) 150 - 400 (K/uL)   PROTIME-INR     Status: Abnormal   Collection Time   03/18/11  5:16 AM      Component Value Range Comment   Prothrombin Time 15.5 (*) 11.6 - 15.2 (seconds)    INR 1.20  0.00 - 1.49    AMMONIA     Status: Normal   Collection Time   03/18/11  5:16 AM      Component Value Range Comment   Ammonia 41  11 - 60 (umol/L)   GLUCOSE, CAPILLARY     Status: Abnormal   Collection Time   03/18/11  6:04 AM      Component Value Range Comment    Glucose-Capillary 137 (*) 70 - 99 (mg/dL)   GLUCOSE, CAPILLARY     Status: Abnormal   Collection Time   03/18/11  7:38 AM      Component Value Range Comment   Glucose-Capillary 141 (*) 70 - 99 (mg/dL)    Comment 1 Notify RN     GLUCOSE, CAPILLARY     Status: Abnormal   Collection Time   03/18/11 11:54 AM      Component Value Range Comment   Glucose-Capillary 174 (*) 70 - 99 (mg/dL)    Comment 1 Notify RN       Micro Results: No results found for this or any previous visit (from the past 240 hour(s)).  Studies/Results: Ct Abdomen Pelvis W Contrast  03/17/2011  *RADIOLOGY REPORT*  Clinical Data: Upper abdominal pain and nausea; bleeding from colostomy.  History of lung cancer.  CT ABDOMEN AND PELVIS WITH CONTRAST  Technique:  Multidetector CT imaging of the abdomen and pelvis was performed following the standard protocol during bolus administration of intravenous contrast.  Contrast: OMNIPAQUE IOHEXOL 300 MG/ML IV SOLN  Comparison: CT of the abdomen and pelvis performed 04/03/2005, and abdominal radiograph performed earlier today at 06:40 p.m.  Findings: The visualized lung bases are clear.  There is a slightly nodular contour to the liver; suggest clinical correlation for mild cirrhotic change.  The liver is otherwise unremarkable in appearance.  The spleen is significantly enlarged, measuring 19.7 cm in length.  The patient is status post cholecystectomy, with clips noted along the gallbladder fossa.  The pancreas and adrenal glands are unremarkable in appearance.  Significant nonspecific perinephric stranding is noted bilaterally. There is minimal right-sided pelvicaliectasis, likely within normal limits.  No definite obstructing ureteral stones are identified. No nonobstructing renal stones are seen.  A tiny calcification anterior to the left renal hilum is thought to be vascular in nature.  The degree of perinephric stranding is mildly worsened from the prior CT.  No free fluid is  identified.  The small bowel remains normal in caliber, without evidence of obstruction.  A tiny diverticulum is noted at the gastric fundus; the stomach is partially filled with contrast and is otherwise unremarkable in appearance.  No acute vascular abnormalities are seen. Scattered calcification is noted along the abdominal aorta and its branches.  This is particularly prominent along the distal abdominal aorta and common iliac arteries bilaterally.  The patient's left lower quadrant colostomy is unremarkable in appearance; the Hartmann's pouch contains a small amount of contrast and stool, and remains within normal limits.  The remaining colon is partially filled with stool.  Note is made of two relatively short loops of contrast-filled small bowel within the left lower quadrant colostomy defect, likely the mid ileum, without evidence of associated soft tissue inflammation.  The bladder is moderately distended and grossly unremarkable in appearance.  The patient is status post hysterectomy; the right ovary is unremarkable in appearance.  No suspicious adnexal masses are seen.  No inguinal lymphadenopathy is seen.  A tiny anterior abdominal wall hernia is noted anterior to the liver, containing only fat.  Mild diffuse soft tissue inflammation and skin thickening are noted along the patient's pannus, raising question for sequelae of mild panniculitis.  No acute osseous abnormalities are identified.  IMPRESSION:  1.  No acute abnormalities seen within the abdomen or pelvis. 2.  Left lower quadrant colostomy grossly unremarkable in appearance.  Two short segments of small bowel are noted within the colostomy defect, likely reflecting mid ileum, without evidence for obstruction or soft tissue inflammation. 3.  Marked splenomegaly. 4.  Slightly nodular contour to the liver; suggest clinical correlation for mild cirrhotic change. 5.  Nonspecific perinephric stranding noted bilaterally, without definite evidence of  pyelonephritis; this is mildly more prominent than in 2007. 6.  Scattered calcification along the abdominal aorta and its branches, particularly prominent along the distal abdominal aorta and the common iliac arteries bilaterally. 7.  Tiny anterior abdominal wall hernia anterior to the liver, containing only fat. 8.  Mild diffuse soft tissue inflammation and skin thickening along the pannus, raising question for sequelae of mild panniculitis.  Original Report Authenticated By: Tonia Ghent, M.D.   Dg Abd Acute W/chest  03/17/2011  *RADIOLOGY REPORT*  Clinical Data: Gastrointestinal bleeding.  Abdominal pain.  Nausea.  ACUTE ABDOMEN SERIES (ABDOMEN 2 VIEW & CHEST 1 VIEW)  Comparison: Chest radiograph 11/09/2005.  CT abdomen 04/03/2005.  Findings: Cardiopericardial silhouette appears within normal limits.  Pulmonary parenchymal scarring is present in a basilar and perihilar distribution which appears unchanged compared to the prior exam.  Suboptimal evaluation of the abdomen due to obese body habitus.  The right abdominal wall is clipped. Cholecystectomy clips are present in the right upper quadrant.  Stomach is distended with food.  The bowel gas is nonobstructive.  There is no gas identified in the rectosigmoid, but reportedly the patient has an ostomy.  Ostomy appliance is not identified radiographically. There is no plain film evidence of free air on decubitus imaging.  IMPRESSION:  1.  Chronic changes of the chest without acute cardiopulmonary disease. 2.  No acute abnormality in the abdomen.  Overall nonobstructive bowel gas pattern allowing for reported presence of ostomy. 3.  Technically suboptimal study secondary to body habitus.  Original Report Authenticated By: Andreas Newport, M.D.   Medications: Scheduled Meds:   . sodium chloride   Intravenous Once  . dicyclomine  10 mg Oral TID AC & HS  . exenatide  5 mcg Subcutaneous BID WC  . Fluticasone-Salmeterol  1 puff Inhalation Q12H  . folic acid  1  mg Oral Daily  . glimepiride  4 mg Oral QAC breakfast  . HYDROmorphone      .  HYDROmorphone  1 mg Intravenous Once  . HYDROmorphone  1 mg Intravenous Once  .  HYDROmorphone (DILAUDID) injection  1 mg Intravenous Once  . insulin aspart  0-15 Units Subcutaneous Q4H  . metFORMIN  1,000 mg Oral BID WC  . metoCLOPramide  10 mg Oral TID  . ondansetron (ZOFRAN) IV  4 mg Intravenous Once  . pantoprazole (PROTONIX) IV  40 mg Intravenous QHS  . rifaximin  550 mg Oral BID  . tolterodine  2 mg Oral Daily   Continuous Infusions:   . 0.9 % NaCl with KCl 20 mEq / L 100 mL/hr at 03/17/11 2323   PRN Meds:.albuterol, diazepam, HYDROmorphone, iohexol, ipratropium, oxyCODONE, traZODone  Assessment/Plan:  #1 Acute GI bleeding questionable secondary to ischemic colitis. Plan is to continue IV hydration with normal saline. 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 #4 history of lung CA #5 COPD-will continue nebulizer treatment i.e. albuterol #6 history of chronic respiratory failure secondary to COPD #7 diabetes mellitus-we'll continue insulin regimen as well as Amaryl #8 history of depression- will continue trazodone. 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

## 2011-03-18 NOTE — Consult Note (Signed)
Chart was reviewed and patient was examined. X-rays were reviewed.   Acute abdominal pain concurreny with bleeding per ostomy.  Lab work and CT scan are not diagnostic for a specific etiology although there is evidence for vascular disease in the abdomen.  Physical exam is remarkable for mild diffuse abdominal tenderness  Impression #1 abdominal pain and bleeding prostate-ischemic colitis and mesenteric ischemia are considerations. Active peptic ulcer disease with bleeding is also a period #2 cirrhosis. Acetaminophen injury can cause acute hepatitis but is not usually a cause for chronic hepatitis and cirrhosis. I suspect that she may have [name]  Recommendations #1 colonoscopy per ostomy. If not diagnostic I will proceed with upper endoscopy #2 continue Protonix #3 if endoscopic studies are not diagnostic I would proceed with a CT angiogram  Barbette Hair. Arlyce Dice, M.D., [facility]
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
## 2011-03-18 NOTE — Consult Note (Signed)
WOC Nurse Consult for Established Ostomy Patient is independent in ostomy care and management. Emergent admission prevented her from bringing needed supplies.  Supplies ordered (4 pouches and 1 belt) and will be sent to bedside. RN aware.  Patient wears a 1 piece drainable pouch with built-in convexity, tape collar and with Lock N' Roll closure. Uses a belt and paste.  Satisfactory wear time (3-4 days). Admission for bleeding in pouch from bowel, not stoma.  Stabalizing now post transfusion. PAtient has no questions other than  WOC team will not follow, but will remain available to this patient and her medical team.  Please re-consult if needed. Thanks, Ladona Mow, MSN, RN, GNP, [employer]

## 2011-03-19 ENCOUNTER — Encounter (HOSPITAL_COMMUNITY): Payer: Self-pay | Admitting: *Deleted

## 2011-03-19 ENCOUNTER — Encounter (HOSPITAL_COMMUNITY)
Admission: EM | Disposition: A | Discharge status: Home / Self Care | Payer: Self-pay | Source: Home / Self Care | Attending: Internal Medicine

## 2011-03-19 HISTORY — PX: ESOPHAGOGASTRODUODENOSCOPY: SHX5428

## 2011-03-19 HISTORY — PX: COLONOSCOPY: SHX5424

## 2011-03-19 LAB — DIFFERENTIAL
Eosinophils Relative: 7 % — ABNORMAL HIGH (ref 0–5)
Lymphocytes Relative: 21 % (ref 12–46)
Monocytes Absolute: 0.2 10*3/uL (ref 0.1–1.0)
Monocytes Relative: 11 % (ref 3–12)
Neutro Abs: 1.1 10*3/uL — ABNORMAL LOW (ref 1.7–7.7)

## 2011-03-19 LAB — COMPREHENSIVE METABOLIC PANEL
BUN: 8 mg/dL (ref 6–23)
CO2: 25 mEq/L (ref 19–32)
Calcium: 8.3 mg/dL — ABNORMAL LOW (ref 8.4–10.5)
Chloride: 109 mEq/L (ref 96–112)
Creatinine, Ser: 0.98 mg/dL (ref 0.50–1.10)
GFR calc Af Amer: 72 mL/min — ABNORMAL LOW (ref >90)
GFR calc non Af Amer: 62 mL/min — ABNORMAL LOW (ref >90)
Total Bilirubin: 0.3 mg/dL (ref 0.3–1.2)

## 2011-03-19 LAB — PREPARE PLATELET PHERESIS

## 2011-03-19 LAB — CBC
HCT: 25.3 % — ABNORMAL LOW (ref 36.0–46.0)
Hemoglobin: 8.6 g/dL — ABNORMAL LOW (ref 12.0–15.0)
MCHC: 34 g/dL (ref 30.0–36.0)
MCV: 95.5 fL (ref 78.0–100.0)
RDW: 14.3 % (ref 11.5–15.5)
WBC: 1.8 10*3/uL — ABNORMAL LOW (ref 4.0–10.5)

## 2011-03-19 LAB — GLUCOSE, CAPILLARY
Glucose-Capillary: 77 mg/dL (ref 70–99)
Glucose-Capillary: 252 mg/dL — ABNORMAL HIGH (ref 70–99)
Glucose-Capillary: 115 mg/dL — ABNORMAL HIGH (ref 70–99)

## 2011-03-19 SURGERY — EGD (ESOPHAGOGASTRODUODENOSCOPY)
Anesthesia: Moderate Sedation

## 2011-03-19 MED ORDER — FENTANYL CITRATE 0.05 MG/ML IJ SOLN
INTRAMUSCULAR | Status: AC
  Filled 2011-03-19: qty 2

## 2011-03-19 MED ORDER — MIDAZOLAM HCL 10 MG/2ML IJ SOLN
INTRAMUSCULAR | Status: AC
  Filled 2011-03-19: qty 2

## 2011-03-19 MED ORDER — DIPHENHYDRAMINE HCL 50 MG/ML IJ SOLN
INTRAMUSCULAR | Status: AC
  Filled 2011-03-19: qty 1

## 2011-03-19 MED ORDER — SODIUM CHLORIDE 0.9 % IV SOLN
Freq: Once | INTRAVENOUS | Status: AC
  Administered 2011-03-19: 500 mL via INTRAVENOUS

## 2011-03-19 MED ORDER — MIDAZOLAM HCL 10 MG/2ML IJ SOLN
INTRAMUSCULAR | Status: DC | PRN
  Administered 2011-03-19 (×7): 2 mg via INTRAVENOUS

## 2011-03-19 MED ORDER — GLYCOPYRROLATE 0.2 MG/ML IJ SOLN
INTRAMUSCULAR | Status: DC | PRN
  Administered 2011-03-19: 0.2 mg via INTRAVENOUS

## 2011-03-19 MED ORDER — GLYCOPYRROLATE 0.2 MG/ML IJ SOLN
INTRAMUSCULAR | Status: AC
  Filled 2011-03-19: qty 1

## 2011-03-19 MED ORDER — DIPHENHYDRAMINE HCL 50 MG/ML IJ SOLN
INTRAMUSCULAR | Status: DC | PRN
  Administered 2011-03-19 (×2): 25 mg via INTRAVENOUS

## 2011-03-19 MED ORDER — FENTANYL NICU IV SYRINGE 50 MCG/ML
INJECTION | INTRAMUSCULAR | Status: DC | PRN
  Administered 2011-03-19 (×6): 25 ug via INTRAVENOUS

## 2011-03-19 MED ORDER — SODIUM CHLORIDE 0.45 % IV SOLN: Freq: Once | INTRAVENOUS | Status: DC

## 2011-03-19 NOTE — H&P (View-Only) (Signed)
Patient ID: [name], female   DOB: Mar 23, 1950, 61 y.o.   MRN: [id number] Patient ID: [name], female   DOB: Jun 27, 1950, 61 y.o.   MRN: [id number] Subjective: Patient seen. Still complaining of epigastric pain. No blood in the colostomy bag. Patient is been followed by the GI physician who recommended an upper endoscopy or colonoscopy of the colostomy.  Objective: Weight change:   Intake/Output Summary (Last 24 hours) at 03/19/11 0925 Last data filed at 03/19/11 0601  Gross per 24 hour  Intake 2351.67 ml  Output    751 ml  Net 1600.67 ml   BP 144/60  Pulse 71  Temp(Src) 97.6 F (36.4 C) (Oral)  Resp 18  Ht 5' (1.524 m)  Wt 83.6 kg (184 lb 4.9 oz)  BMI 35.99 kg/m2  SpO2 100% Physical Exam: General appearance: alert, cooperative and not in distress, pallor Head: Normocephalic, without obvious abnormality, atraumatic Neck: no adenopathy, no carotid bruit, no JVD, supple, symmetrical, trachea midline and thyroid not enlarged, symmetric, no tenderness/mass/nodules Lungs: clear to auscultation bilaterally Heart: regular rate and rhythm, S1, S2 normal, no murmur, click, rub or gallop Abdomen: soft, surgical scar, tenderness in the epigastrium, no guarding, colostomy  in situ, no organomegaly, bowel sounds are positive. Extremities: extremities normal, atraumatic, no cyanosis or edema Skin: Slightly decreased turgor.  Lab Results: Results for orders placed during the hospital encounter of 03/17/11 (from the past 48 hour(s))  CBC     Status: Abnormal   Collection Time   03/17/11  5:38 PM      Component Value Range Comment   WBC 2.9 (*) 4.0 - 10.5 (K/uL)    RBC 3.01 (*) 3.87 - 5.11 (MIL/uL)    Hemoglobin 9.6 (*) 12.0 - 15.0 (g/dL)    HCT 96.2 (*) 95.2 - 46.0 (%)    MCV 94.7  78.0 - 100.0 (fL)    MCH 31.9  26.0 - 34.0 (pg)    MCHC 33.7  30.0 - 36.0 (g/dL)    RDW 84.1  32.4 - 40.1 (%)    Platelets 62 (*) 150 - 400 (K/uL)   DIFFERENTIAL     Status: Abnormal   Collection Time   03/17/11  5:38 PM      Component Value Range Comment   Neutrophils Relative 66  43 - 77 (%)    Lymphocytes Relative 20  12 - 46 (%)    Monocytes Relative 9  3 - 12 (%)    Eosinophils Relative 5  0 - 5 (%)    Basophils Relative 0  0 - 1 (%)    Neutro Abs 1.9  1.7 - 7.7 (K/uL)    Lymphs Abs 0.6 (*) 0.7 - 4.0 (K/uL)    Monocytes Absolute 0.3  0.1 - 1.0 (K/uL)    Eosinophils Absolute 0.1  0.0 - 0.7 (K/uL)    Basophils Absolute 0.0  0.0 - 0.1 (K/uL)    Smear Review MORPHOLOGY UNREMARKABLE     COMPREHENSIVE METABOLIC PANEL     Status: Abnormal   Collection Time   03/17/11  5:38 PM      Component Value Range Comment   Sodium 136  135 - 145 (mEq/L)    Potassium 4.4  3.5 - 5.1 (mEq/L)    Chloride 103  96 - 112 (mEq/L)    CO2 22  19 - 32 (mEq/L)    Glucose, Bld 301 (*) 70 - 99 (mg/dL)    BUN 15  6 - 23 (mg/dL)  Creatinine, Ser 1.04  0.50 - 1.10 (mg/dL)    Calcium 9.3  8.4 - 10.5 (mg/dL)    Total Protein 7.0  6.0 - 8.3 (g/dL)    Albumin 3.8  3.5 - 5.2 (g/dL)    AST 48 (*) 0 - 37 (U/L)    ALT 54 (*) 0 - 35 (U/L)    Alkaline Phosphatase 56  39 - 117 (U/L)    Total Bilirubin 0.2 (*) 0.3 - 1.2 (mg/dL)    GFR calc non Af Amer 58 (*) >90 (mL/min)    GFR calc Af Amer 67 (*) >90 (mL/min)   LIPASE, BLOOD     Status: Normal   Collection Time   03/17/11  5:38 PM      Component Value Range Comment   Lipase 49  11 - 59 (U/L)   PROTIME-INR     Status: Normal   Collection Time   03/17/11  5:38 PM      Component Value Range Comment   Prothrombin Time 13.4  11.6 - 15.2 (seconds)    INR 1.00  0.00 - 1.49    TYPE AND SCREEN     Status: Normal   Collection Time   03/17/11  6:23 PM      Component Value Range Comment   ABO/RH(D) A POS      Antibody Screen NEG      Sample Expiration 03/20/2011     ABO/RH     Status: Normal   Collection Time   03/17/11  6:23 PM      Component Value Range Comment   ABO/RH(D) A POS     PREPARE PLATELET PHERESIS     Status: Normal   Collection Time     03/17/11  8:30 PM      Component Value Range Comment   Unit Number [id number]      Blood Component Type PLTPHER LR3      Unit division 00      Status of Unit ISSUED,FINAL      Transfusion Status OK TO TRANSFUSE     GLUCOSE, CAPILLARY     Status: Abnormal   Collection Time   03/17/11 10:00 PM      Component Value Range Comment   Glucose-Capillary 178 (*) 70 - 99 (mg/dL)    Comment 1 Notify RN     HEMOGLOBIN AND HEMATOCRIT, BLOOD     Status: Abnormal   Collection Time   03/17/11 10:45 PM      Component Value Range Comment   Hemoglobin 8.6 (*) 12.0 - 15.0 (g/dL)    HCT 21.3 (*) 08.6 - 46.0 (%)   GLUCOSE, CAPILLARY     Status: Abnormal   Collection Time   03/18/11  1:49 AM      Component Value Range Comment   Glucose-Capillary 128 (*) 70 - 99 (mg/dL)   BASIC METABOLIC PANEL     Status: Abnormal   Collection Time   03/18/11  5:16 AM      Component Value Range Comment   Sodium 142  135 - 145 (mEq/L)    Potassium 4.2  3.5 - 5.1 (mEq/L)    Chloride 109  96 - 112 (mEq/L)    CO2 26  19 - 32 (mEq/L)    Glucose, Bld 132 (*) 70 - 99 (mg/dL)    BUN 12  6 - 23 (mg/dL)    Creatinine, Ser 5.78  0.50 - 1.10 (mg/dL)    Calcium 8.7  8.4 - 10.5 (mg/dL)  GFR calc non Af Amer 56 (*) >90 (mL/min)    GFR calc Af Amer 65 (*) >90 (mL/min)   CBC     Status: Abnormal   Collection Time   03/18/11  5:16 AM      Component Value Range Comment   WBC 2.0 (*) 4.0 - 10.5 (K/uL)    RBC 2.65 (*) 3.87 - 5.11 (MIL/uL)    Hemoglobin 8.4 (*) 12.0 - 15.0 (g/dL)    HCT 82.9 (*) 56.2 - 46.0 (%)    MCV 94.0  78.0 - 100.0 (fL)    MCH 31.7  26.0 - 34.0 (pg)    MCHC 33.7  30.0 - 36.0 (g/dL)    RDW 13.0  86.5 - 78.4 (%)    Platelets 52 (*) 150 - 400 (K/uL)   PROTIME-INR     Status: Abnormal   Collection Time   03/18/11  5:16 AM      Component Value Range Comment   Prothrombin Time 15.5 (*) 11.6 - 15.2 (seconds)    INR 1.20  0.00 - 1.49    AMMONIA     Status: Normal   Collection Time   03/18/11  5:16 AM       Component Value Range Comment   Ammonia 41  11 - 60 (umol/L)   GLUCOSE, CAPILLARY     Status: Abnormal   Collection Time   03/18/11  6:04 AM      Component Value Range Comment   Glucose-Capillary 137 (*) 70 - 99 (mg/dL)   GLUCOSE, CAPILLARY     Status: Abnormal   Collection Time   03/18/11  7:38 AM      Component Value Range Comment   Glucose-Capillary 141 (*) 70 - 99 (mg/dL)    Comment 1 Notify RN     GLUCOSE, CAPILLARY     Status: Abnormal   Collection Time   03/18/11 11:54 AM      Component Value Range Comment   Glucose-Capillary 174 (*) 70 - 99 (mg/dL)    Comment 1 Notify RN     MRSA PCR SCREENING     Status: Normal   Collection Time   03/18/11  2:18 PM      Component Value Range Comment   MRSA by PCR NEGATIVE  NEGATIVE    GLUCOSE, CAPILLARY     Status: Normal   Collection Time   03/18/11  5:13 PM      Component Value Range Comment   Glucose-Capillary 88  70 - 99 (mg/dL)   GLUCOSE, CAPILLARY     Status: Abnormal   Collection Time   03/18/11  9:47 PM      Component Value Range Comment   Glucose-Capillary 125 (*) 70 - 99 (mg/dL)   GLUCOSE, CAPILLARY     Status: Normal   Collection Time   03/19/11  1:37 AM      Component Value Range Comment   Glucose-Capillary 77  70 - 99 (mg/dL)   CBC     Status: Abnormal   Collection Time   03/19/11  4:50 AM      Component Value Range Comment   WBC 1.8 (*) 4.0 - 10.5 (K/uL)    RBC 2.65 (*) 3.87 - 5.11 (MIL/uL)    Hemoglobin 8.6 (*) 12.0 - 15.0 (g/dL)    HCT 69.6 (*) 29.5 - 46.0 (%)    MCV 95.5  78.0 - 100.0 (fL)    MCH 32.5  26.0 - 34.0 (pg)    MCHC 34.0  30.0 - 36.0 (g/dL)    RDW 16.1  09.6 - 04.5 (%)    Platelets 50 (*) 150 - 400 (K/uL)   DIFFERENTIAL     Status: Abnormal   Collection Time   03/19/11  4:50 AM      Component Value Range Comment   Neutrophils Relative 59  43 - 77 (%)    Neutro Abs 1.1 (*) 1.7 - 7.7 (K/uL)    Lymphocytes Relative 21  12 - 46 (%)    Lymphs Abs 0.4 (*) 0.7 - 4.0 (K/uL)    Monocytes Relative 11  3 -  12 (%)    Monocytes Absolute 0.2  0.1 - 1.0 (K/uL)    Eosinophils Relative 7 (*) 0 - 5 (%)    Eosinophils Absolute 0.1  0.0 - 0.7 (K/uL)    Basophils Relative 2 (*) 0 - 1 (%)    Basophils Absolute 0.0  0.0 - 0.1 (K/uL)   COMPREHENSIVE METABOLIC PANEL     Status: Abnormal   Collection Time   03/19/11  4:50 AM      Component Value Range Comment   Sodium 142  135 - 145 (mEq/L)    Potassium 4.2  3.5 - 5.1 (mEq/L)    Chloride 109  96 - 112 (mEq/L)    CO2 25  19 - 32 (mEq/L)    Glucose, Bld 110 (*) 70 - 99 (mg/dL)    BUN 8  6 - 23 (mg/dL)    Creatinine, Ser 4.09  0.50 - 1.10 (mg/dL)    Calcium 8.3 (*) 8.4 - 10.5 (mg/dL)    Total Protein 6.0  6.0 - 8.3 (g/dL)    Albumin 3.3 (*) 3.5 - 5.2 (g/dL)    AST 31  0 - 37 (U/L)    ALT 37 (*) 0 - 35 (U/L)    Alkaline Phosphatase 40  39 - 117 (U/L)    Total Bilirubin 0.3  0.3 - 1.2 (mg/dL)    GFR calc non Af Amer 62 (*) >90 (mL/min)    GFR calc Af Amer 72 (*) >90 (mL/min)   GLUCOSE, CAPILLARY     Status: Abnormal   Collection Time   03/19/11  6:11 AM      Component Value Range Comment   Glucose-Capillary 118 (*) 70 - 99 (mg/dL)   GLUCOSE, CAPILLARY     Status: Abnormal   Collection Time   03/19/11  7:47 AM      Component Value Range Comment   Glucose-Capillary 113 (*) 70 - 99 (mg/dL)    Comment 1 Notify RN       Micro Results: Recent Results (from the past 240 hour(s))  MRSA PCR SCREENING     Status: Normal   Collection Time   03/18/11  2:18 PM      Component Value Range Status Comment   MRSA by PCR NEGATIVE  NEGATIVE  Final     Studies/Results: Ct Abdomen Pelvis W Contrast  03/17/2011  *RADIOLOGY REPORT*  Clinical Data: Upper abdominal pain and nausea; bleeding from colostomy.  History of lung cancer.  CT ABDOMEN AND PELVIS WITH CONTRAST  Technique:  Multidetector CT imaging of the abdomen and pelvis was performed following the standard protocol during bolus administration of intravenous contrast.  Contrast: OMNIPAQUE IOHEXOL 300  MG/ML IV SOLN  Comparison: CT of the abdomen and pelvis performed 04/03/2005, and abdominal radiograph performed earlier today at 06:40 p.m.  Findings: The visualized lung bases are clear.  There is a slightly  nodular contour to the liver; suggest clinical correlation for mild cirrhotic change.  The liver is otherwise unremarkable in appearance.  The spleen is significantly enlarged, measuring 19.7 cm in length.  The patient is status post cholecystectomy, with clips noted along the gallbladder fossa.  The pancreas and adrenal glands are unremarkable in appearance.  Significant nonspecific perinephric stranding is noted bilaterally. There is minimal right-sided pelvicaliectasis, likely within normal limits.  No definite obstructing ureteral stones are identified. No nonobstructing renal stones are seen.  A tiny calcification anterior to the left renal hilum is thought to be vascular in nature.  The degree of perinephric stranding is mildly worsened from the prior CT.  No free fluid is identified.  The small bowel remains normal in caliber, without evidence of obstruction.  A tiny diverticulum is noted at the gastric fundus; the stomach is partially filled with contrast and is otherwise unremarkable in appearance.  No acute vascular abnormalities are seen. Scattered calcification is noted along the abdominal aorta and its branches.  This is particularly prominent along the distal abdominal aorta and common iliac arteries bilaterally.  The patient's left lower quadrant colostomy is unremarkable in appearance; the Hartmann's pouch contains a small amount of contrast and stool, and remains within normal limits.  The remaining colon is partially filled with stool.  Note is made of two relatively short loops of contrast-filled small bowel within the left lower quadrant colostomy defect, likely the mid ileum, without evidence of associated soft tissue inflammation.  The bladder is moderately distended and grossly  unremarkable in appearance.  The patient is status post hysterectomy; the right ovary is unremarkable in appearance.  No suspicious adnexal masses are seen.  No inguinal lymphadenopathy is seen.  A tiny anterior abdominal wall hernia is noted anterior to the liver, containing only fat.  Mild diffuse soft tissue inflammation and skin thickening are noted along the patient's pannus, raising question for sequelae of mild panniculitis.  No acute osseous abnormalities are identified.  IMPRESSION:  1.  No acute abnormalities seen within the abdomen or pelvis. 2.  Left lower quadrant colostomy grossly unremarkable in appearance.  Two short segments of small bowel are noted within the colostomy defect, likely reflecting mid ileum, without evidence for obstruction or soft tissue inflammation. 3.  Marked splenomegaly. 4.  Slightly nodular contour to the liver; suggest clinical correlation for mild cirrhotic change. 5.  Nonspecific perinephric stranding noted bilaterally, without definite evidence of pyelonephritis; this is mildly more prominent than in 2007. 6.  Scattered calcification along the abdominal aorta and its branches, particularly prominent along the distal abdominal aorta and the common iliac arteries bilaterally. 7.  Tiny anterior abdominal wall hernia anterior to the liver, containing only fat. 8.  Mild diffuse soft tissue inflammation and skin thickening along the pannus, raising question for sequelae of mild panniculitis.  Original Report Authenticated By: Tonia Ghent, M.D.   Dg Abd Acute W/chest  03/17/2011  *RADIOLOGY REPORT*  Clinical Data: Gastrointestinal bleeding.  Abdominal pain.  Nausea.  ACUTE ABDOMEN SERIES (ABDOMEN 2 VIEW & CHEST 1 VIEW)  Comparison: Chest radiograph 11/09/2005.  CT abdomen 04/03/2005.  Findings: Cardiopericardial silhouette appears within normal limits.  Pulmonary parenchymal scarring is present in a basilar and perihilar distribution which appears unchanged compared to the  prior exam.  Suboptimal evaluation of the abdomen due to obese body habitus.  The right abdominal wall is clipped. Cholecystectomy clips are present in the right upper quadrant.  Stomach is distended with food.  The  bowel gas is nonobstructive.  There is no gas identified in the rectosigmoid, but reportedly the patient has an ostomy.  Ostomy appliance is not identified radiographically. There is no plain film evidence of free air on decubitus imaging.  IMPRESSION:  1.  Chronic changes of the chest without acute cardiopulmonary disease. 2.  No acute abnormality in the abdomen.  Overall nonobstructive bowel gas pattern allowing for reported presence of ostomy. 3.  Technically suboptimal study secondary to body habitus.  Original Report Authenticated By: Andreas Newport, M.D.   Medications: Scheduled Meds:    . dicyclomine  10 mg Oral TID AC & HS  . exenatide  5 mcg Subcutaneous BID WC  . Fluticasone-Salmeterol  1 puff Inhalation Q12H  . folic acid  1 mg Oral Daily  . glimepiride  4 mg Oral QAC breakfast  . HYDROmorphone      . insulin aspart  0-15 Units Subcutaneous Q4H  . metFORMIN  1,000 mg Oral BID WC  . metoCLOPramide  10 mg Oral TID  . pantoprazole (PROTONIX) IV  40 mg Intravenous QHS  . polyethylene glycol powder  1 Container Oral Once  . polyethylene glycol powder  1 Container Oral Once  . rifaximin  550 mg Oral BID  . tolterodine  2 mg Oral Daily   Continuous Infusions:    . 0.9 % NaCl with KCl 20 mEq / L 100 mL/hr at 03/19/11 0601   PRN Meds:.albuterol, diazepam, HYDROmorphone, ipratropium, oxyCODONE, traZODone, DISCONTD: HYDROmorphone  Assessment/Plan:  #1 Acute GI bleeding questionable secondary to ischemic colitis. Plan is to continue IV hydration with normal saline. Patient is being followed by the GI physician. 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 #4 history of lung  CA #5 COPD-will continue nebulizer treatment i.e. albuterol #6 history of chronic respiratory failure secondary to COPD #7 diabetes mellitus-we'll continue insulin regimen as well as Amaryl #8 history of depression- will continue trazodone. 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

## 2011-03-19 NOTE — Progress Notes (Signed)
Patient ID: [name], female   DOB: 03/24/1950, 60 y.o.   MRN: [id number] Patient ID: [name], female   DOB: 10/26/1950, 60 y.o.   MRN: [id number] Subjective: Patient seen. Still complaining of epigastric pain. No blood in the colostomy bag. Patient is been followed by the GI physician who recommended an upper endoscopy or colonoscopy of the colostomy.  Objective: Weight change:   Intake/Output Summary (Last 24 hours) at 03/19/11 0925 Last data filed at 03/19/11 0601  Gross per 24 hour  Intake 2351.67 ml  Output    751 ml  Net 1600.67 ml   BP 144/60  Pulse 71  Temp(Src) 97.6 F (36.4 C) (Oral)  Resp 18  Ht 5' (1.524 m)  Wt 83.6 kg (184 lb 4.9 oz)  BMI 35.99 kg/m2  SpO2 100% Physical Exam: General appearance: alert, cooperative and not in distress, pallor Head: Normocephalic, without obvious abnormality, atraumatic Neck: no adenopathy, no carotid bruit, no JVD, supple, symmetrical, trachea midline and thyroid not enlarged, symmetric, no tenderness/mass/nodules Lungs: clear to auscultation bilaterally Heart: regular rate and rhythm, S1, S2 normal, no murmur, click, rub or gallop Abdomen: soft, surgical scar, tenderness in the epigastrium, no guarding, colostomy  in situ, no organomegaly, bowel sounds are positive. Extremities: extremities normal, atraumatic, no cyanosis or edema Skin: Slightly decreased turgor.  Lab Results: Results for orders placed during the hospital encounter of 03/17/11 (from the past 48 hour(s))  CBC     Status: Abnormal   Collection Time   03/17/11  5:38 PM      Component Value Range Comment   WBC 2.9 (*) 4.0 - 10.5 (K/uL)    RBC 3.01 (*) 3.87 - 5.11 (MIL/uL)    Hemoglobin 9.6 (*) 12.0 - 15.0 (g/dL)    HCT 28.5 (*) 36.0 - 46.0 (%)    MCV 94.7  78.0 - 100.0 (fL)    MCH 31.9  26.0 - 34.0 (pg)    MCHC 33.7  30.0 - 36.0 (g/dL)    RDW 14.2  11.5 - 15.5 (%)    Platelets 62 (*) 150 - 400 (K/uL)   DIFFERENTIAL     Status: Abnormal   Collection Time   03/17/11  5:38 PM      Component Value Range Comment   Neutrophils Relative 66  43 - 77 (%)    Lymphocytes Relative 20  12 - 46 (%)    Monocytes Relative 9  3 - 12 (%)    Eosinophils Relative 5  0 - 5 (%)    Basophils Relative 0  0 - 1 (%)    Neutro Abs 1.9  1.7 - 7.7 (K/uL)    Lymphs Abs 0.6 (*) 0.7 - 4.0 (K/uL)    Monocytes Absolute 0.3  0.1 - 1.0 (K/uL)    Eosinophils Absolute 0.1  0.0 - 0.7 (K/uL)    Basophils Absolute 0.0  0.0 - 0.1 (K/uL)    Smear Review MORPHOLOGY UNREMARKABLE     COMPREHENSIVE METABOLIC PANEL     Status: Abnormal   Collection Time   03/17/11  5:38 PM      Component Value Range Comment   Sodium 136  135 - 145 (mEq/L)    Potassium 4.4  3.5 - 5.1 (mEq/L)    Chloride 103  96 - 112 (mEq/L)    CO2 22  19 - 32 (mEq/L)    Glucose, Bld 301 (*) 70 - 99 (mg/dL)    BUN 15  6 - 23 (mg/dL)      Creatinine, Ser 1.04  0.50 - 1.10 (mg/dL)    Calcium 9.3  8.4 - 10.5 (mg/dL)    Total Protein 7.0  6.0 - 8.3 (g/dL)    Albumin 3.8  3.5 - 5.2 (g/dL)    AST 48 (*) 0 - 37 (U/L)    ALT 54 (*) 0 - 35 (U/L)    Alkaline Phosphatase 56  39 - 117 (U/L)    Total Bilirubin 0.2 (*) 0.3 - 1.2 (mg/dL)    GFR calc non Af Amer 58 (*) >90 (mL/min)    GFR calc Af Amer 67 (*) >90 (mL/min)   LIPASE, BLOOD     Status: Normal   Collection Time   03/17/11  5:38 PM      Component Value Range Comment   Lipase 49  11 - 59 (U/L)   PROTIME-INR     Status: Normal   Collection Time   03/17/11  5:38 PM      Component Value Range Comment   Prothrombin Time 13.4  11.6 - 15.2 (seconds)    INR 1.00  0.00 - 1.49    TYPE AND SCREEN     Status: Normal   Collection Time   03/17/11  6:23 PM      Component Value Range Comment   ABO/RH(D) A POS      Antibody Screen NEG      Sample Expiration 03/20/2011     ABO/RH     Status: Normal   Collection Time   03/17/11  6:23 PM      Component Value Range Comment   ABO/RH(D) A POS     PREPARE PLATELET PHERESIS     Status: Normal   Collection Time     03/17/11  8:30 PM      Component Value Range Comment   Unit Number [id number]      Blood Component Type PLTPHER LR3      Unit division 00      Status of Unit ISSUED,FINAL      Transfusion Status OK TO TRANSFUSE     GLUCOSE, CAPILLARY     Status: Abnormal   Collection Time   03/17/11 10:00 PM      Component Value Range Comment   Glucose-Capillary 178 (*) 70 - 99 (mg/dL)    Comment 1 Notify RN     HEMOGLOBIN AND HEMATOCRIT, BLOOD     Status: Abnormal   Collection Time   03/17/11 10:45 PM      Component Value Range Comment   Hemoglobin 8.6 (*) 12.0 - 15.0 (g/dL)    HCT 25.1 (*) 36.0 - 46.0 (%)   GLUCOSE, CAPILLARY     Status: Abnormal   Collection Time   03/18/11  1:49 AM      Component Value Range Comment   Glucose-Capillary 128 (*) 70 - 99 (mg/dL)   BASIC METABOLIC PANEL     Status: Abnormal   Collection Time   03/18/11  5:16 AM      Component Value Range Comment   Sodium 142  135 - 145 (mEq/L)    Potassium 4.2  3.5 - 5.1 (mEq/L)    Chloride 109  96 - 112 (mEq/L)    CO2 26  19 - 32 (mEq/L)    Glucose, Bld 132 (*) 70 - 99 (mg/dL)    BUN 12  6 - 23 (mg/dL)    Creatinine, Ser 1.06  0.50 - 1.10 (mg/dL)    Calcium 8.7  8.4 - 10.5 (mg/dL)      GFR calc non Af Amer 56 (*) >90 (mL/min)    GFR calc Af Amer 65 (*) >90 (mL/min)   CBC     Status: Abnormal   Collection Time   03/18/11  5:16 AM      Component Value Range Comment   WBC 2.0 (*) 4.0 - 10.5 (K/uL)    RBC 2.65 (*) 3.87 - 5.11 (MIL/uL)    Hemoglobin 8.4 (*) 12.0 - 15.0 (g/dL)    HCT 24.9 (*) 36.0 - 46.0 (%)    MCV 94.0  78.0 - 100.0 (fL)    MCH 31.7  26.0 - 34.0 (pg)    MCHC 33.7  30.0 - 36.0 (g/dL)    RDW 14.3  11.5 - 15.5 (%)    Platelets 52 (*) 150 - 400 (K/uL)   PROTIME-INR     Status: Abnormal   Collection Time   03/18/11  5:16 AM      Component Value Range Comment   Prothrombin Time 15.5 (*) 11.6 - 15.2 (seconds)    INR 1.20  0.00 - 1.49    AMMONIA     Status: Normal   Collection Time   03/18/11  5:16 AM       Component Value Range Comment   Ammonia 41  11 - 60 (umol/L)   GLUCOSE, CAPILLARY     Status: Abnormal   Collection Time   03/18/11  6:04 AM      Component Value Range Comment   Glucose-Capillary 137 (*) 70 - 99 (mg/dL)   GLUCOSE, CAPILLARY     Status: Abnormal   Collection Time   03/18/11  7:38 AM      Component Value Range Comment   Glucose-Capillary 141 (*) 70 - 99 (mg/dL)    Comment 1 Notify RN     GLUCOSE, CAPILLARY     Status: Abnormal   Collection Time   03/18/11 11:54 AM      Component Value Range Comment   Glucose-Capillary 174 (*) 70 - 99 (mg/dL)    Comment 1 Notify RN     MRSA PCR SCREENING     Status: Normal   Collection Time   03/18/11  2:18 PM      Component Value Range Comment   MRSA by PCR NEGATIVE  NEGATIVE    GLUCOSE, CAPILLARY     Status: Normal   Collection Time   03/18/11  5:13 PM      Component Value Range Comment   Glucose-Capillary 88  70 - 99 (mg/dL)   GLUCOSE, CAPILLARY     Status: Abnormal   Collection Time   03/18/11  9:47 PM      Component Value Range Comment   Glucose-Capillary 125 (*) 70 - 99 (mg/dL)   GLUCOSE, CAPILLARY     Status: Normal   Collection Time   03/19/11  1:37 AM      Component Value Range Comment   Glucose-Capillary 77  70 - 99 (mg/dL)   CBC     Status: Abnormal   Collection Time   03/19/11  4:50 AM      Component Value Range Comment   WBC 1.8 (*) 4.0 - 10.5 (K/uL)    RBC 2.65 (*) 3.87 - 5.11 (MIL/uL)    Hemoglobin 8.6 (*) 12.0 - 15.0 (g/dL)    HCT 25.3 (*) 36.0 - 46.0 (%)    MCV 95.5  78.0 - 100.0 (fL)    MCH 32.5  26.0 - 34.0 (pg)    MCHC 34.0    30.0 - 36.0 (g/dL)    RDW 14.3  11.5 - 15.5 (%)    Platelets 50 (*) 150 - 400 (K/uL)   DIFFERENTIAL     Status: Abnormal   Collection Time   03/19/11  4:50 AM      Component Value Range Comment   Neutrophils Relative 59  43 - 77 (%)    Neutro Abs 1.1 (*) 1.7 - 7.7 (K/uL)    Lymphocytes Relative 21  12 - 46 (%)    Lymphs Abs 0.4 (*) 0.7 - 4.0 (K/uL)    Monocytes Relative 11  3 -  12 (%)    Monocytes Absolute 0.2  0.1 - 1.0 (K/uL)    Eosinophils Relative 7 (*) 0 - 5 (%)    Eosinophils Absolute 0.1  0.0 - 0.7 (K/uL)    Basophils Relative 2 (*) 0 - 1 (%)    Basophils Absolute 0.0  0.0 - 0.1 (K/uL)   COMPREHENSIVE METABOLIC PANEL     Status: Abnormal   Collection Time   03/19/11  4:50 AM      Component Value Range Comment   Sodium 142  135 - 145 (mEq/L)    Potassium 4.2  3.5 - 5.1 (mEq/L)    Chloride 109  96 - 112 (mEq/L)    CO2 25  19 - 32 (mEq/L)    Glucose, Bld 110 (*) 70 - 99 (mg/dL)    BUN 8  6 - 23 (mg/dL)    Creatinine, Ser 0.98  0.50 - 1.10 (mg/dL)    Calcium 8.3 (*) 8.4 - 10.5 (mg/dL)    Total Protein 6.0  6.0 - 8.3 (g/dL)    Albumin 3.3 (*) 3.5 - 5.2 (g/dL)    AST 31  0 - 37 (U/L)    ALT 37 (*) 0 - 35 (U/L)    Alkaline Phosphatase 40  39 - 117 (U/L)    Total Bilirubin 0.3  0.3 - 1.2 (mg/dL)    GFR calc non Af Amer 62 (*) >90 (mL/min)    GFR calc Af Amer 72 (*) >90 (mL/min)   GLUCOSE, CAPILLARY     Status: Abnormal   Collection Time   03/19/11  6:11 AM      Component Value Range Comment   Glucose-Capillary 118 (*) 70 - 99 (mg/dL)   GLUCOSE, CAPILLARY     Status: Abnormal   Collection Time   03/19/11  7:47 AM      Component Value Range Comment   Glucose-Capillary 113 (*) 70 - 99 (mg/dL)    Comment 1 Notify RN       Micro Results: Recent Results (from the past 240 hour(s))  MRSA PCR SCREENING     Status: Normal   Collection Time   03/18/11  2:18 PM      Component Value Range Status Comment   MRSA by PCR NEGATIVE  NEGATIVE  Final     Studies/Results: Ct Abdomen Pelvis W Contrast  03/17/2011  *RADIOLOGY REPORT*  Clinical Data: Upper abdominal pain and nausea; bleeding from colostomy.  History of lung cancer.  CT ABDOMEN AND PELVIS WITH CONTRAST  Technique:  Multidetector CT imaging of the abdomen and pelvis was performed following the standard protocol during bolus administration of intravenous contrast.  Contrast: 100mL OMNIPAQUE IOHEXOL 300  MG/ML IV SOLN  Comparison: CT of the abdomen and pelvis performed 04/03/2005, and abdominal radiograph performed earlier today at 06:40 p.m.  Findings: The visualized lung bases are clear.  There is a slightly   nodular contour to the liver; suggest clinical correlation for mild cirrhotic change.  The liver is otherwise unremarkable in appearance.  The spleen is significantly enlarged, measuring 19.7 cm in length.  The patient is status post cholecystectomy, with clips noted along the gallbladder fossa.  The pancreas and adrenal glands are unremarkable in appearance.  Significant nonspecific perinephric stranding is noted bilaterally. There is minimal right-sided pelvicaliectasis, likely within normal limits.  No definite obstructing ureteral stones are identified. No nonobstructing renal stones are seen.  A tiny calcification anterior to the left renal hilum is thought to be vascular in nature.  The degree of perinephric stranding is mildly worsened from the prior CT.  No free fluid is identified.  The small bowel remains normal in caliber, without evidence of obstruction.  A tiny diverticulum is noted at the gastric fundus; the stomach is partially filled with contrast and is otherwise unremarkable in appearance.  No acute vascular abnormalities are seen. Scattered calcification is noted along the abdominal aorta and its branches.  This is particularly prominent along the distal abdominal aorta and common iliac arteries bilaterally.  The patient's left lower quadrant colostomy is unremarkable in appearance; the Hartmann's pouch contains a small amount of contrast and stool, and remains within normal limits.  The remaining colon is partially filled with stool.  Note is made of two relatively short loops of contrast-filled small bowel within the left lower quadrant colostomy defect, likely the mid ileum, without evidence of associated soft tissue inflammation.  The bladder is moderately distended and grossly  unremarkable in appearance.  The patient is status post hysterectomy; the right ovary is unremarkable in appearance.  No suspicious adnexal masses are seen.  No inguinal lymphadenopathy is seen.  A tiny anterior abdominal wall hernia is noted anterior to the liver, containing only fat.  Mild diffuse soft tissue inflammation and skin thickening are noted along the patient's pannus, raising question for sequelae of mild panniculitis.  No acute osseous abnormalities are identified.  IMPRESSION:  1.  No acute abnormalities seen within the abdomen or pelvis. 2.  Left lower quadrant colostomy grossly unremarkable in appearance.  Two short segments of small bowel are noted within the colostomy defect, likely reflecting mid ileum, without evidence for obstruction or soft tissue inflammation. 3.  Marked splenomegaly. 4.  Slightly nodular contour to the liver; suggest clinical correlation for mild cirrhotic change. 5.  Nonspecific perinephric stranding noted bilaterally, without definite evidence of pyelonephritis; this is mildly more prominent than in 2007. 6.  Scattered calcification along the abdominal aorta and its branches, particularly prominent along the distal abdominal aorta and the common iliac arteries bilaterally. 7.  Tiny anterior abdominal wall hernia anterior to the liver, containing only fat. 8.  Mild diffuse soft tissue inflammation and skin thickening along the pannus, raising question for sequelae of mild panniculitis.  Original Report Authenticated By: JEFFREY CHANG, M.D.   Dg Abd Acute W/chest  03/17/2011  *RADIOLOGY REPORT*  Clinical Data: Gastrointestinal bleeding.  Abdominal pain.  Nausea.  ACUTE ABDOMEN SERIES (ABDOMEN 2 VIEW & CHEST 1 VIEW)  Comparison: Chest radiograph 11/09/2005.  CT abdomen 04/03/2005.  Findings: Cardiopericardial silhouette appears within normal limits.  Pulmonary parenchymal scarring is present in a basilar and perihilar distribution which appears unchanged compared to the  prior exam.  Suboptimal evaluation of the abdomen due to obese body habitus.  The right abdominal wall is clipped. Cholecystectomy clips are present in the right upper quadrant.  Stomach is distended with food.  The   bowel gas is nonobstructive.  There is no gas identified in the rectosigmoid, but reportedly the patient has an ostomy.  Ostomy appliance is not identified radiographically. There is no plain film evidence of free air on decubitus imaging.  IMPRESSION:  1.  Chronic changes of the chest without acute cardiopulmonary disease. 2.  No acute abnormality in the abdomen.  Overall nonobstructive bowel gas pattern allowing for reported presence of ostomy. 3.  Technically suboptimal study secondary to body habitus.  Original Report Authenticated By: GEOFFREY LAMKE, M.D.   Medications: Scheduled Meds:    . dicyclomine  10 mg Oral TID AC & HS  . exenatide  5 mcg Subcutaneous BID WC  . Fluticasone-Salmeterol  1 puff Inhalation Q12H  . folic acid  1 mg Oral Daily  . glimepiride  4 mg Oral QAC breakfast  . HYDROmorphone      . insulin aspart  0-15 Units Subcutaneous Q4H  . metFORMIN  1,000 mg Oral BID WC  . metoCLOPramide  10 mg Oral TID  . pantoprazole (PROTONIX) IV  40 mg Intravenous QHS  . polyethylene glycol powder  1 Container Oral Once  . polyethylene glycol powder  1 Container Oral Once  . rifaximin  550 mg Oral BID  . tolterodine  2 mg Oral Daily   Continuous Infusions:    . 0.9 % NaCl with KCl 20 mEq / L 100 mL/hr at 03/19/11 0601   PRN Meds:.albuterol, diazepam, HYDROmorphone, ipratropium, oxyCODONE, traZODone, DISCONTD: HYDROmorphone  Assessment/Plan:  #1 Acute GI bleeding questionable secondary to ischemic colitis. Plan is to continue IV hydration with normal saline. Patient is being followed by the GI physician. 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 #4 history of lung  CA #5 COPD-will continue nebulizer treatment i.e. albuterol #6 history of chronic respiratory failure secondary to COPD #7 diabetes mellitus-we'll continue insulin regimen as well as Amaryl #8 history of depression- will continue trazodone. 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 

## 2011-03-19 NOTE — Progress Notes (Signed)
CARE MANAGEMENT NOTE 03/19/2011  Patient:  [name], [name]   Account Number:  [id number]  Date Initiated:  03/19/2011  Documentation initiated by:  Terika Pillard  Subjective/Objective Assessment:   gi bleed in patient with recent gi surg colostomy     Action/Plan:   lives at home   Anticipated DC Date:  03/22/2011   Anticipated DC Plan:  HOME/SELF CARE         Choice offered to / List presented to:             Status of service:  In process, will continue to follow Medicare Important Message given?   (If response is "NO", the following Medicare IM given date fields will be blank) Date Medicare IM given:   Date Additional Medicare IM given:    Discharge Disposition:    Per UR Regulation:  Reviewed for med. necessity/level of care/duration of stay  Comments:  [id number]/Migel Hannis,RN,BSN,CCM

## 2011-03-19 NOTE — Progress Notes (Signed)
Colonoscopy was negative for a bleeding source. 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 No fresh or old blood was seen.  All the changes on upper endoscopy could be a source for GI bleeding, a small bowel source should be ruled out.  Recommendations #1 capsule endoscopy-to be scheduled in a.m.

## 2011-03-19 NOTE — Interval H&P Note (Signed)
History and Physical Interval Note:  03/19/2011 3:00 PM  [name]  has presented today for surgery, with the diagnosis of blood per ostomy and abdominal pain.  The various methods of treatment have been discussed with the patient and family. After consideration of risks, benefits and other options for treatment, the patient has consented to  Procedure(s) (LRB): ESOPHAGOGASTRODUODENOSCOPY (EGD) (N/A) COLONOSCOPY (N/A) as a surgical intervention .  The patients' history has been reviewed, patient examined, no change in status, stable for surgery.  I have reviewed the patients' chart and labs.  Questions were answered to the patient's satisfaction.    The recent H&P (dated 03/19/11) was reviewed, the patient was examined and there is no change in the patients condition since that H&P was completed.   [name]  03/19/2011, 3:00 PM    [name]

## 2011-03-19 NOTE — Op Note (Signed)
[facility] [address] [place], [place]  [address]  COLONOSCOPY PROCEDURE REPORT  PATIENT:  [name], [name]  MR#:  [id number] BIRTHDATE:  06-26-1950, 60 yrs. old  GENDER:  female ENDOSCOPIST:  Barbette Hair. Arlyce Dice, MD REF. BY: PROCEDURE DATE:  03/19/2011 PROCEDURE:  Colonoscopy via colostomy ASA CLASS:  Class III INDICATIONS:  hematochezia MEDICATIONS:   These medications were titrated to patient response per physician's verbal order, Fentanyl 125 mcg IV, Versed 12 mg IV, Benadryl 50 mg IV  DESCRIPTION OF PROCEDURE:   After the risks benefits and alternatives of the procedure were thoroughly explained, informed consent was obtained.  Digital rectal exam was performed and revealed no abnormalities.   The Pentax Colonoscope [id number] endoscope was introduced through the anus and advanced to the cecum, which was identified by both the appendix and ileocecal valve, without limitations.  The quality of the prep was good, using MoviPrep.  The instrument was then slowly withdrawn as the colon was fully examined. <<PROCEDUREIMAGES>>  FINDINGS:  Abnormal appearing mucosa (see image4). Mild inflammation, friability at ostomy stump  This was otherwise a normal examination of the colon. There was no fresh or old blood (see image2 and image3).   Retroflexed views in the rectum revealed not done.    The time to cecum =  minutes. The scope was then withdrawn in  minutes from the cecum and the procedure completed. COMPLICATIONS:  None ENDOSCOPIC IMPRESSION: 1) mild inflammation of the mucosa in the ostomy stump 2) Otherwise normal examination  No source of bleeding was identified bythis exam RECOMMENDATIONS: 1) Upper endoscopy REPEAT EXAM:  No  ______________________________ Barbette Hair. Arlyce Dice, MD  CC:  [name], MD  n. Rosalie DoctorBarbette Hair. Lilee Aldea at 03/19/2011 03:41 PM  Storm Frisk, [phone]

## 2011-03-19 NOTE — Op Note (Signed)
[facility] [address] [place], [place]  [address]  ENDOSCOPY PROCEDURE REPORT  PATIENT:  [name], [name]  MR#:  [id number] BIRTHDATE:  1950-06-14, 60 yrs. old  GENDER:  female  ENDOSCOPIST:  Barbette Hair. Arlyce Dice, MD Referred by:  PROCEDURE DATE:  03/19/2011 PROCEDURE:  EGD, diagnostic [address] ASA CLASS:  Class III INDICATIONS:  hematochezia  MEDICATIONS:   There was residual sedation effect present from prior procedure., Fentanyl 25 mcg IV, Versed 2 mg IV TOPICAL ANESTHETIC:  Cetacaine Spray  DESCRIPTION OF PROCEDURE:   After the risks and benefits of the procedure were explained, informed consent was obtained.  The Pentax Gastroscope [id number] endoscope was introduced through the mouth and advanced to the third portion of the duodenum.  The instrument was slowly withdrawn as the mucosa was fully examined. <<PROCEDUREIMAGES>>  portal gastropathy. Diffuse edematous folds in the gastric cardia and fundus. There were multiple vascular ectasias in this area. No fresh or old blood was seen (see image3 and image4).  Otherwise the examination was normal (see image1, image2, and image5). Retroflexed views revealed Retroflexion exam demonstrated findings as previously described.    The scope was then withdrawn from the patient and the procedure completed.  COMPLICATIONS:  None  ENDOSCOPIC IMPRESSION: 1) Portal gastropathy with vascular ectasia 2) Otherwise normal examination 3) Retroflexion exam demonstrated findings as previously described.  The vascular abnormalities in the stomach could potentially be a source for GI bleeding although there is no evidence for this by today's exam RECOMMENDATIONS: 1) capsule endoscopy  ______________________________ Barbette Hair. Arlyce Dice, MD  CC:  [name], MD  n. Rosalie DoctorBarbette Hair. Kaplan at 03/19/2011 03:52 PM  Storm Frisk, [phone]

## 2011-03-20 ENCOUNTER — Encounter (HOSPITAL_COMMUNITY)
Admission: EM | Disposition: A | Discharge status: Home / Self Care | Payer: Self-pay | Source: Home / Self Care | Attending: Internal Medicine

## 2011-03-20 ENCOUNTER — Encounter (HOSPITAL_COMMUNITY): Payer: Self-pay

## 2011-03-20 ENCOUNTER — Encounter (HOSPITAL_COMMUNITY): Payer: Self-pay | Admitting: Gastroenterology

## 2011-03-20 DIAGNOSIS — K922 Gastrointestinal hemorrhage, unspecified: Secondary | ICD-10-CM

## 2011-03-20 HISTORY — PX: GIVENS CAPSULE STUDY: SHX5432

## 2011-03-20 LAB — CBC
Hemoglobin: 9.3 g/dL — ABNORMAL LOW (ref 12.0–15.0)
RBC: 2.88 MIL/uL — ABNORMAL LOW (ref 3.87–5.11)

## 2011-03-20 LAB — GLUCOSE, CAPILLARY
Glucose-Capillary: 110 mg/dL — ABNORMAL HIGH (ref 70–99)
Glucose-Capillary: 132 mg/dL — ABNORMAL HIGH (ref 70–99)
Glucose-Capillary: 76 mg/dL (ref 70–99)
Glucose-Capillary: 85 mg/dL (ref 70–99)
Glucose-Capillary: 90 mg/dL (ref 70–99)

## 2011-03-20 LAB — COMPREHENSIVE METABOLIC PANEL
Alkaline Phosphatase: 42 U/L (ref 39–117)
BUN: 6 mg/dL (ref 6–23)
Chloride: 110 mEq/L (ref 96–112)
GFR calc Af Amer: 73 mL/min — ABNORMAL LOW (ref >90)
Glucose, Bld: 79 mg/dL (ref 70–99)
Potassium: 4.3 mEq/L (ref 3.5–5.1)
Total Bilirubin: 0.3 mg/dL (ref 0.3–1.2)

## 2011-03-20 LAB — DIFFERENTIAL
Lymphocytes Relative: 17 % (ref 12–46)
Lymphs Abs: 0.5 10*3/uL — ABNORMAL LOW (ref 0.7–4.0)
Monocytes Relative: 9 % (ref 3–12)
Neutro Abs: 2 10*3/uL (ref 1.7–7.7)
Neutrophils Relative %: 70 % (ref 43–77)

## 2011-03-20 SURGERY — IMAGING PROCEDURE, GI TRACT, INTRALUMINAL, VIA CAPSULE
Anesthesia: LOCAL

## 2011-03-20 MED ORDER — PANTOPRAZOLE SODIUM 40 MG PO TBEC
40.0000 mg | DELAYED_RELEASE_TABLET | Freq: Two times a day (BID) | ORAL | Status: DC
  Administered 2011-03-21: 40 mg via ORAL
  Filled 2011-03-20 (×2): qty 1

## 2011-03-20 SURGICAL SUPPLY — 1 items: TOWEL COTTON PACK 4EA (MISCELLANEOUS) ×4 IMPLANT

## 2011-03-20 NOTE — Progress Notes (Signed)
Givens capsule recorder picked up from pt's room.

## 2011-03-20 NOTE — Progress Notes (Signed)
Subjective: Patient is still having intermittent abdominal pain.  No other specific complaints.  Objective: Vital signs in last 24 hours: Filed Vitals:   03/19/11 2300 03/20/11 0611 03/20/11 0814 03/20/11 1446  BP: 169/65 133/75  149/71  Pulse: 71 65  90  Temp: 98.6 F (37 C) 97.9 F (36.6 C)  99.2 F (37.3 C)  TempSrc: Oral Oral  Oral  Resp: 18 18  18   Height:      Weight:      SpO2: 100% 99% 100% 100%   Weight change:   Intake/Output Summary (Last 24 hours) at 03/20/11 1704 Last data filed at 03/20/11 1238  Gross per 24 hour  Intake   1950 ml  Output    650 ml  Net   1300 ml    Physical Exam: General: Awake, Oriented, No acute distress. HEENT: EOMI. Neck: Supple CV: S1 and S2 Lungs: Clear to ascultation bilaterally Abdomen: Soft, Nontender, Nondistended, +bowel sounds. Ext: Good pulses. Trace edema.  Lab Results:  Basename 03/20/11 0500 03/19/11 0450  NA 142 142  K 4.3 4.2  CL 110 109  CO2 24 25  GLUCOSE 79 110*  BUN 6 8  CREATININE 0.97 0.98  CALCIUM 8.1* 8.3*  MG -- --  PHOS -- --    Basename 03/20/11 0500 03/19/11 0450  AST 26 31  ALT 30 37*  ALKPHOS 42 40  BILITOT 0.3 0.3  PROT 5.9* 6.0  ALBUMIN 3.3* 3.3*    Basename 03/17/11 1738  LIPASE 49  AMYLASE --    Basename 03/20/11 0500 03/19/11 0450  WBC 2.8* 1.8*  NEUTROABS 2.0 1.1*  HGB 9.3* 8.6*  HCT 27.4* 25.3*  MCV 95.1 95.5  PLT 65* 50*   No results found for this basename: CKTOTAL:3,CKMB:3,CKMBINDEX:3,TROPONINI:3 in the last 72 hours No components found with this basename: POCBNP:3 No results found for this basename: DDIMER:2 in the last 72 hours No results found for this basename: HGBA1C:2 in the last 72 hours No results found for this basename: CHOL:2,HDL:2,LDLCALC:2,TRIG:2,CHOLHDL:2,LDLDIRECT:2 in the last 72 hours No results found for this basename: TSH,T4TOTAL,FREET3,T3FREE,THYROIDAB in the last 72 hours No results found for this basename:  VITAMINB12:2,FOLATE:2,FERRITIN:2,TIBC:2,IRON:2,RETICCTPCT:2 in the last 72 hours  Micro Results: Recent Results (from the past 240 hour(s))  MRSA PCR SCREENING     Status: Normal   Collection Time   03/18/11  2:18 PM      Component Value Range Status Comment   MRSA by PCR NEGATIVE  NEGATIVE  Final     Studies/Results: No results found.  Medications: I have reviewed the patient's current medications. Scheduled Meds:   . sodium chloride   Intravenous Once  . dicyclomine  10 mg Oral TID AC & HS  . exenatide  5 mcg Subcutaneous BID WC  . Fluticasone-Salmeterol  1 puff Inhalation Q12H  . folic acid  1 mg Oral Daily  . glimepiride  4 mg Oral QAC breakfast  . insulin aspart  0-15 Units Subcutaneous Q4H  . metFORMIN  1,000 mg Oral BID WC  . metoCLOPramide  10 mg Oral TID  . pantoprazole (PROTONIX) IV  40 mg Intravenous QHS  . rifaximin  550 mg Oral BID  . tolterodine  2 mg Oral Daily   Continuous Infusions:   . 0.9 % NaCl with KCl 20 mEq / L 100 mL/hr at 03/20/11 1242   PRN Meds:.albuterol, diazepam, HYDROmorphone, ipratropium, oxyCODONE, traZODone  Assessment/Plan: GI bleed. Continue IV hydration.  Patient had capsule endoscopy today.  Management as per Dr.  Arlyce Dice, gastroenterology.  Hemoglobin stable.  Continue PPI.  Anemia / Pancytopenia (including leukopenia and thrombocytopenia) Likely due to acute blood loss anemia.  Pancytopenia likely due to cirrhosis.  History of lung cancer Stable  History of COPD/chronic respiratory failure secondary to COPD Stable  Type 2 diabetes Continue exenatide, glimepiride, and metformin.  Continue sliding scale insulin.  History of depression Stable  History of cervical disc syndrome Stable.  History of GERD Continue PPI will transition to oral.  History of cirrhosis Stable  Prophylaxis No heparin given concern for GI bleed.  Continue PPI.  Disposition.  Pending.   LOS: 3 days  Meggen Spaziani A, MD 03/20/2011, 5:04 PM

## 2011-03-20 NOTE — Progress Notes (Signed)
Patient ID: [name], female   DOB: 1950-02-02, 61 y.o.   MRN: [id number] Subjective: **She is still complaining of abdominal pain although does not appear to be uncomfortable.*There has been no further bleeding. Currently undergoing capsule endoscopy.  Objective: Vital signs in last 24 hours: Temp:  [97.9 F (36.6 C)-98.6 F (37 C)] 97.9 F (36.6 C) (02/20 0611) Pulse Rate:  [65-71] 65  (02/20 0611) Resp:  [14-23] 18  (02/20 0611) BP: (104-170)/(60-110) 133/75 mmHg (02/20 0611) SpO2:  [98 %-100 %] 100 % (02/20 0814) Last BM Date: 03/19/11 General:   Alert,  Well-developed, well-nourished, pleasant and cooperative in NAD Head:  Normocephalic and atraumatic. Eyes:  Sclera clear, no icterus.   Conjunctiva pink. Mouth:  No deformity or lesions, dentition normal. Neck:  Supple; no masses or thyromegaly. Heart:  Regular rate and rhythm; no murmurs, clicks, rubs,  or gallops. Abdomen:  Soft,and nondistended. No masses, hepatosplenomegaly or hernias noted. Normal bowel sounds, without guarding, and without rebound.  There is very mild periumbilical tenderness to light palpation Msk:  Symmetrical without gross deformities. Normal posture. Pulses:  Normal pulses noted. Extremities:  Without clubbing or edema. Neurologic:  Alert and  oriented x4;  grossly normal neurologically. Skin:  Intact without significant lesions or rashes. Cervical Nodes:  No significant cervical adenopathy. Psych:  Alert and cooperative. Normal mood and affect.  Intake/Output from previous day: 02/19 0701 - 02/20 0700 In: 1950 [I.V.:1950] Out: 450 [Urine:450] Intake/Output this shift:    Lab Results:  Basename 03/20/11 0500 03/19/11 0450 03/18/11 0516  WBC 2.8* 1.8* 2.0*  HGB 9.3* 8.6* 8.4*  HCT 27.4* 25.3* 24.9*  PLT 65* 50* 52*   BMET  Basename 03/20/11 0500 03/19/11 0450 03/18/11 0516  NA 142 142 142  K 4.3 4.2 4.2  CL 110 109 109  CO2 24 25 26   GLUCOSE 79 110* 132*  BUN 6 8 12   CREATININE  0.97 0.98 1.06  CALCIUM 8.1* 8.3* 8.7   LFT  Basename 03/20/11 0500  PROT 5.9*  ALBUMIN 3.3*  AST 26  ALT 30  ALKPHOS 42  BILITOT 0.3  BILIDIR --  IBILI --   PT/INR  Basename 03/18/11 0516 03/17/11 1738  LABPROT 15.5* 13.4  INR 1.20 1.00   Hepatitis Panel No results found for this basename: HEPBSAG,HCVAB,HEPAIGM,HEPBIGM in the last 72 hours   Studies/Results: No results found.  Assessment: *Abdominal pain with recent bleeding per ostomy. Colonoscopy was negative. Vascular telangiectasia were seen in the stomach but there was no active bleeding. Etiology for pain and bleeding has not clearly been established. Currently undergoing capsule endoscopy to rule out any small bowel source for both pain and bleeding.  Plan-review capsule endoscopy**         Zacary Bauer D. Arlyce Dice, MD, [facility] [phone]   [name]  03/20/2011, 9:17 AM

## 2011-03-21 ENCOUNTER — Encounter (HOSPITAL_COMMUNITY): Payer: Self-pay | Admitting: Gastroenterology

## 2011-03-21 LAB — CBC
HCT: 30.3 % — ABNORMAL LOW (ref 36.0–46.0)
Hemoglobin: 10.4 g/dL — ABNORMAL LOW (ref 12.0–15.0)
RBC: 3.2 MIL/uL — ABNORMAL LOW (ref 3.87–5.11)
RDW: 14.1 % (ref 11.5–15.5)
WBC: 4.4 10*3/uL (ref 4.0–10.5)

## 2011-03-21 LAB — GLUCOSE, CAPILLARY
Glucose-Capillary: 128 mg/dL — ABNORMAL HIGH (ref 70–99)
Glucose-Capillary: 127 mg/dL — ABNORMAL HIGH (ref 70–99)

## 2011-03-21 NOTE — Progress Notes (Signed)
Subjective: Patient complaining of chronic abdominal pain.  Tolerating solid food.  Objective: Vital signs in last 24 hours: Filed Vitals:   03/20/11 2140 03/20/11 2221 03/21/11 0447 03/21/11 0815  BP: 157/84  168/67   Pulse: 82  82   Temp: 99.2 F (37.3 C)  98.4 F (36.9 C)   TempSrc: Oral  Oral   Resp:  22 20   Height:      Weight:      SpO2: 98%  99% 97%   Weight change:   Intake/Output Summary (Last 24 hours) at 03/21/11 1151 Last data filed at 03/21/11 0600  Gross per 24 hour  Intake   2520 ml  Output   1400 ml  Net   1120 ml    Physical Exam: General: Awake, Oriented, No acute distress. HEENT: EOMI. Neck: Supple CV: S1 and S2 Lungs: Clear to ascultation bilaterally Abdomen: Soft, Nontender, Nondistended, +bowel sounds. Ext: Good pulses. Trace edema.  Lab Results:  Basename 03/20/11 0500 03/19/11 0450  NA 142 142  K 4.3 4.2  CL 110 109  CO2 24 25  GLUCOSE 79 110*  BUN 6 8  CREATININE 0.97 0.98  CALCIUM 8.1* 8.3*  MG -- --  PHOS -- --    Basename 03/20/11 0500 03/19/11 0450  AST 26 31  ALT 30 37*  ALKPHOS 42 40  BILITOT 0.3 0.3  PROT 5.9* 6.0  ALBUMIN 3.3* 3.3*   No results found for this basename: LIPASE:2,AMYLASE:2 in the last 72 hours  Basename 03/21/11 0449 03/20/11 0500 03/19/11 0450  WBC 4.4 2.8* --  NEUTROABS -- 2.0 1.1*  HGB 10.4* 9.3* --  HCT 30.3* 27.4* --  MCV 94.7 95.1 --  PLT 69* 65* --   No results found for this basename: CKTOTAL:3,CKMB:3,CKMBINDEX:3,TROPONINI:3 in the last 72 hours No components found with this basename: POCBNP:3 No results found for this basename: DDIMER:2 in the last 72 hours No results found for this basename: HGBA1C:2 in the last 72 hours No results found for this basename: CHOL:2,HDL:2,LDLCALC:2,TRIG:2,CHOLHDL:2,LDLDIRECT:2 in the last 72 hours No results found for this basename: TSH,T4TOTAL,FREET3,T3FREE,THYROIDAB in the last 72 hours No results found for this basename:  VITAMINB12:2,FOLATE:2,FERRITIN:2,TIBC:2,IRON:2,RETICCTPCT:2 in the last 72 hours  Micro Results: Recent Results (from the past 240 hour(s))  MRSA PCR SCREENING     Status: Normal   Collection Time   03/18/11  2:18 PM      Component Value Range Status Comment   MRSA by PCR NEGATIVE  NEGATIVE  Final     Studies/Results: No results found.  Medications: I have reviewed the patient's current medications. Scheduled Meds:    . sodium chloride   Intravenous Once  . dicyclomine  10 mg Oral TID AC & HS  . exenatide  5 mcg Subcutaneous BID WC  . Fluticasone-Salmeterol  1 puff Inhalation Q12H  . folic acid  1 mg Oral Daily  . glimepiride  4 mg Oral QAC breakfast  . insulin aspart  0-15 Units Subcutaneous Q4H  . metFORMIN  1,000 mg Oral BID WC  . metoCLOPramide  10 mg Oral TID  . pantoprazole  40 mg Oral BID AC  . rifaximin  550 mg Oral BID  . tolterodine  2 mg Oral Daily  . DISCONTD: pantoprazole (PROTONIX) IV  40 mg Intravenous QHS   Continuous Infusions:    . 0.9 % NaCl with KCl 20 mEq / L 100 mL/hr at 03/21/11 0644   PRN Meds:.albuterol, diazepam, HYDROmorphone, ipratropium, oxyCODONE, traZODone  Assessment/Plan: GI bleed  Stable.  Patient had capsule endoscopy on 03/20/2011, results pending.  Management as per Dr. Arlyce Dice, gastroenterology.  Hemoglobin improved.  Continue PPI.  Acute on chronic abdominal pain Stable.  Patient to resume home pain medications at discharge.  Patient reports that she has enough pain medications at home after discharge.  Anemia / Pancytopenia (including leukopenia and thrombocytopenia) Likely due to acute blood loss anemia.  Pancytopenia likely due to cirrhosis.  History of lung cancer Stable  History of COPD/chronic respiratory failure secondary to COPD Stable  Type 2 diabetes Continue exenatide, glimepiride, and metformin.  Continue sliding scale insulin.  History of depression Stable  History of cervical disc  syndrome Stable.  History of GERD Continue PPI will transition to oral.  History of cirrhosis Stable  Prophylaxis No heparin given concern for GI bleed.  Continue PPI.  Disposition.  Discharge patient home today.   LOS: 4 days  Braniyah Besse A, MD 03/21/2011, 11:51 AM

## 2011-03-21 NOTE — Consult Note (Signed)
[facility] Progress Note  SUBJECTIVE: no further bleeding, non-bloody BM in ostomy this am. Chronic mid abdominal pain.   OBJECTIVE:  Vital signs in last 24 hours: Temp:  [98.4 F (36.9 C)-99.2 F (37.3 C)] 98.4 F (36.9 C) (02/21 0447) Pulse Rate:  [82-90] 82  (02/21 0447) Resp:  [18-22] 20  (02/21 0447) BP: (149-168)/(67-84) 168/67 mmHg (02/21 0447) SpO2:  [97 %-100 %] 97 % (02/21 0815) Last BM Date: 03/20/11 General:    Pleasant white female in NAD Abdomen:  Soft,  Non-distended, mild mid abdominal tenderness. Normal bowel sounds. Extremities:  Without edema. Neurologic:  Alert and oriented,  grossly normal neurologically. Psych:  Cooperative. Normal mood and affect.   Lab Results:  Basename 03/21/11 0449 03/20/11 0500 03/19/11 0450  WBC 4.4 2.8* 1.8*  HGB 10.4* 9.3* 8.6*  HCT 30.3* 27.4* 25.3*  PLT 69* 65* 50*   BMET  Basename 03/20/11 0500 03/19/11 0450  NA 142 142  K 4.3 4.2  CL 110 109  CO2 24 25  GLUCOSE 79 110*  BUN 6 8  CREATININE 0.97 0.98  CALCIUM 8.1* 8.3*   LFT  Basename 03/20/11 0500  PROT 5.9*  ALBUMIN 3.3*  AST 26  ALT 30  ALKPHOS 42  BILITOT 0.3  BILIDIR --  IBILI --    ASSESSMENT / PLAN:  1. Acute on chronic abdominal pain with acute on chronic bleeding per ostomy. EGD showed only vascular ectasia of the stomach without active bleeding. Colonoscopy showed mucosal irritation around ostomy. No active bleeding. Hemoglobin stable at 10.4, actually up from yesterday. No blood transfusion necessary for bleeding. Small bowel video capsule study results are pending. She had  a non-bloody BM today and is eating solid food. Patient still using IV Dilaudid for pain. Takes Oxycodone at home for abdominal pain. Recommend transitioning her back to Oxycodone now and can be discharged home if pain controlled with IV meds. Patient has a gastroenterologist in [place], [place] (Dr. [name]). Patient will request hospital records be sent to her.     2. Cirrhosis of liver. 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    LOS: 4 days   Willette Cluster  03/21/2011, 9:38 AM   Reviewed and agree with management. Barbette Hair. Arlyce Dice, M.D., [facility]

## 2011-03-21 NOTE — Progress Notes (Signed)
Patient discharged home with family, alert and oriented, discharge instructions given, patient verbalize understanding of discharge instructions given, patient in stable condition at this time

## 2011-03-21 NOTE — Discharge Summary (Signed)
Discharge Summary  [name] MR#: [id number]  DOB:[birth year]  Date of Admission: 03/17/2011 Date of Discharge: 03/21/2011  Patient's PCP: [name], MD, MD  Patient's gastroenterologist: Dr. [name], at [facility]  Attending Physician:[name] A  Consults: Treatment Team:  Louis Meckel, MD, gastroenterology.   Discharge Diagnoses: Principal Problem:  *Acute GI bleeding Active Problems:  CARCINOMA, LUNG, SQUAMOUS CELL  DIABETES MELLITUS, TYPE II  DEPRESSION  HYPERTENSION  COPD  Chronic respiratory failure  Cirrhosis of liver not due to alcohol  Thrombocytopenia  Anemia  Irritable bowel syndrome  Hx of cervical cancer  Brief Admitting History and Physical 61 year old Caucasian female with history of colostomy, colostomy placed due to perforation from diverticulitis, acute on chronic abdominal pain who presented on 03/17/2011 with abdominal pain and blood in ostomy bag.  Discharge Medications Medication List  As of 03/21/2011 11:56 AM   TAKE these medications         albuterol-ipratropium 18-103 MCG/ACT inhaler   Commonly known as: COMBIVENT   Inhale 2 puffs into the lungs every 4 (four) hours as needed. For asthma      cholecalciferol 1000 UNITS tablet   Commonly known as: VITAMIN D   Take 1,000 Units by mouth daily.      diazepam 5 MG tablet   Commonly known as: VALIUM   Take 5 mg by mouth every 8 (eight) hours as needed. For anxiety      dicyclomine 10 MG capsule   Commonly known as: BENTYL   Take 10 mg by mouth 4 (four) times daily -  before meals and at bedtime.      esomeprazole 40 MG capsule   Commonly known as: NEXIUM   Take 40 mg by mouth daily before breakfast.      exenatide 5 MCG/0.02ML Soln   Commonly known as: BYETTA   Inject 5 mcg into the skin 2 (two) times daily with a meal.      Fluticasone-Salmeterol 250-50 MCG/DOSE Aepb   Commonly known as: ADVAIR   Inhale 1 puff into the lungs every 12 (twelve) hours.        folic acid 1 MG tablet   Commonly known as: FOLVITE   Take 1 mg by mouth daily.      furosemide 20 MG tablet   Commonly known as: LASIX   Take 20 mg by mouth 2 (two) times daily.      glimepiride 4 MG tablet   Commonly known as: AMARYL   Take 4 mg by mouth daily before breakfast.      metFORMIN 500 MG tablet   Commonly known as: GLUCOPHAGE   Take 1,000 mg by mouth 2 (two) times daily with a meal.      metoCLOPramide 10 MG tablet   Commonly known as: REGLAN   Take 10 mg by mouth 3 (three) times daily.      oxyCODONE 15 MG immediate release tablet   Commonly known as: ROXICODONE   Take 15 mg by mouth every 6 (six) hours as needed. For pain relief      promethazine 25 MG tablet   Commonly known as: PHENERGAN   Take 25 mg by mouth every 6 (six) hours as needed. For nausea      rifaximin 550 MG Tabs   Commonly known as: XIFAXAN   Take 550 mg by mouth 2 (two) times daily.      tolterodine 2 MG 24 hr capsule   Commonly known as: DETROL LA   Take  2 mg by mouth daily.      traZODone 100 MG tablet   Commonly known as: DESYREL   Take 100 mg by mouth at bedtime.            Hospital Course: GI bleed. Patient was admitted and hemoglobin was trended serially.  Initially hemoglobin dropped and without intervention hemoglobin improved 2 days prior to discharge.  Gastroenterology, Dr. Arlyce Dice, was consulted and the patient had capsule endoscopy done on 03/20/2011, results pending.  Given improvement in hemoglobin and no further bleeding, gastroenterology thought that the patient could be discharged with gastroenterology following up on the capsule endoscopy and having the results sent to her gastroenterologist in [facility] (Dr. [name]). Continue PPI.  Acute on chronic abdominal pain Stable.  Initially patient was placed on Dilaudid IV which was transitioned back to home pain medications at discharge.  Patient reports that she has enough pain medications at home  after discharge.  Anemia (acute blood loss from GI bleed) / Pancytopenia (including leukopenia and thrombocytopenia) Pancytopenia likely due to cirrhosis.  Stable.  Did not require any blood transfusion during the course of hospital stay.  Hemoglobin improved prior to discharge.  History of lung cancer Stable  History of COPD/chronic respiratory failure secondary to COPD Stable  Type 2 diabetes Continue exenatide, glimepiride, and metformin at discharge.  History of depression Stable  History of cervical disc syndrome Stable.  History of GERD Continue PPI will transition to oral.  History of cirrhosis Stable  Day of Discharge BP 168/67  Pulse 82  Temp(Src) 98.4 F (36.9 C) (Oral)  Resp 20  Ht 5' (1.524 m)  Wt 83.6 kg (184 lb 4.9 oz)  BMI 35.99 kg/m2  SpO2 97%  Results for orders placed during the hospital encounter of 03/17/11 (from the past 48 hour(s))  GLUCOSE, CAPILLARY     Status: Abnormal   Collection Time   03/19/11  8:42 PM      Component Value Range Comment   Glucose-Capillary 115 (*) 70 - 99 (mg/dL)    Comment 1 Notify RN     GLUCOSE, CAPILLARY     Status: Abnormal   Collection Time   03/19/11 11:57 PM      Component Value Range Comment   Glucose-Capillary 113 (*) 70 - 99 (mg/dL)    Comment 1 Notify RN     GLUCOSE, CAPILLARY     Status: Normal   Collection Time   03/20/11  3:55 AM      Component Value Range Comment   Glucose-Capillary 85  70 - 99 (mg/dL)    Comment 1 Notify RN     CBC     Status: Abnormal   Collection Time   03/20/11  5:00 AM      Component Value Range Comment   WBC 2.8 (*) 4.0 - 10.5 (K/uL)    RBC 2.88 (*) 3.87 - 5.11 (MIL/uL)    Hemoglobin 9.3 (*) 12.0 - 15.0 (g/dL)    HCT 16.1 (*) 09.6 - 46.0 (%)    MCV 95.1  78.0 - 100.0 (fL)    MCH 32.3  26.0 - 34.0 (pg)    MCHC 33.9  30.0 - 36.0 (g/dL)    RDW 04.5  40.9 - 81.1 (%)    Platelets 65 (*) 150 - 400 (K/uL)   DIFFERENTIAL     Status: Abnormal   Collection Time   03/20/11  5:00  AM      Component Value Range Comment   Neutrophils  Relative 70  43 - 77 (%)    Neutro Abs 2.0  1.7 - 7.7 (K/uL)    Lymphocytes Relative 17  12 - 46 (%)    Lymphs Abs 0.5 (*) 0.7 - 4.0 (K/uL)    Monocytes Relative 9  3 - 12 (%)    Monocytes Absolute 0.2  0.1 - 1.0 (K/uL)    Eosinophils Relative 4  0 - 5 (%)    Eosinophils Absolute 0.1  0.0 - 0.7 (K/uL)    Basophils Relative 0  0 - 1 (%)    Basophils Absolute 0.0  0.0 - 0.1 (K/uL)   COMPREHENSIVE METABOLIC PANEL     Status: Abnormal   Collection Time   03/20/11  5:00 AM      Component Value Range Comment   Sodium 142  135 - 145 (mEq/L)    Potassium 4.3  3.5 - 5.1 (mEq/L)    Chloride 110  96 - 112 (mEq/L)    CO2 24  19 - 32 (mEq/L)    Glucose, Bld 79  70 - 99 (mg/dL)    BUN 6  6 - 23 (mg/dL)    Creatinine, Ser 2.95  0.50 - 1.10 (mg/dL)    Calcium 8.1 (*) 8.4 - 10.5 (mg/dL)    Total Protein 5.9 (*) 6.0 - 8.3 (g/dL)    Albumin 3.3 (*) 3.5 - 5.2 (g/dL)    AST 26  0 - 37 (U/L)    ALT 30  0 - 35 (U/L)    Alkaline Phosphatase 42  39 - 117 (U/L)    Total Bilirubin 0.3  0.3 - 1.2 (mg/dL)    GFR calc non Af Amer 63 (*) >90 (mL/min)    GFR calc Af Amer 73 (*) >90 (mL/min)   GLUCOSE, CAPILLARY     Status: Normal   Collection Time   03/20/11  5:56 AM      Component Value Range Comment   Glucose-Capillary 76  70 - 99 (mg/dL)    Comment 1 Notify RN     GLUCOSE, CAPILLARY     Status: Normal   Collection Time   03/20/11  8:57 AM      Component Value Range Comment   Glucose-Capillary 90  70 - 99 (mg/dL)    Comment 1 Notify RN      Comment 2 Documented in Chart     GLUCOSE, CAPILLARY     Status: Abnormal   Collection Time   03/20/11 11:56 AM      Component Value Range Comment   Glucose-Capillary 110 (*) 70 - 99 (mg/dL)    Comment 1 Notify RN     GLUCOSE, CAPILLARY     Status: Abnormal   Collection Time   03/20/11  4:17 PM      Component Value Range Comment   Glucose-Capillary 118 (*) 70 - 99 (mg/dL)    Comment 1 Notify RN     GLUCOSE,  CAPILLARY     Status: Abnormal   Collection Time   03/20/11  8:05 PM      Component Value Range Comment   Glucose-Capillary 185 (*) 70 - 99 (mg/dL)    Comment 1 Notify RN     GLUCOSE, CAPILLARY     Status: Abnormal   Collection Time   03/20/11 11:37 PM      Component Value Range Comment   Glucose-Capillary 132 (*) 70 - 99 (mg/dL)   GLUCOSE, CAPILLARY     Status: Abnormal   Collection Time  03/21/11  1:39 AM      Component Value Range Comment   Glucose-Capillary 128 (*) 70 - 99 (mg/dL)   CBC     Status: Abnormal   Collection Time   03/21/11  4:49 AM      Component Value Range Comment   WBC 4.4  4.0 - 10.5 (K/uL)    RBC 3.20 (*) 3.87 - 5.11 (MIL/uL)    Hemoglobin 10.4 (*) 12.0 - 15.0 (g/dL)    HCT 16.1 (*) 09.6 - 46.0 (%)    MCV 94.7  78.0 - 100.0 (fL)    MCH 32.5  26.0 - 34.0 (pg)    MCHC 34.3  30.0 - 36.0 (g/dL)    RDW 04.5  40.9 - 81.1 (%)    Platelets 69 (*) 150 - 400 (K/uL) CONSISTENT WITH PREVIOUS RESULT  GLUCOSE, CAPILLARY     Status: Abnormal   Collection Time   03/21/11  6:37 AM      Component Value Range Comment   Glucose-Capillary 129 (*) 70 - 99 (mg/dL)   GLUCOSE, CAPILLARY     Status: Abnormal   Collection Time   03/21/11  7:28 AM      Component Value Range Comment   Glucose-Capillary 127 (*) 70 - 99 (mg/dL)    Comment 1 Notify RN       Ct Abdomen Pelvis W Contrast  03/17/2011  *RADIOLOGY REPORT*  Clinical Data: Upper abdominal pain and nausea; bleeding from colostomy.  History of lung cancer.  CT ABDOMEN AND PELVIS WITH CONTRAST  Technique:  Multidetector CT imaging of the abdomen and pelvis was performed following the standard protocol during bolus administration of intravenous contrast.  Contrast: OMNIPAQUE IOHEXOL 300 MG/ML IV SOLN  Comparison: CT of the abdomen and pelvis performed 04/03/2005, and abdominal radiograph performed earlier today at 06:40 p.m.  Findings: The visualized lung bases are clear.  There is a slightly nodular contour to the liver;  suggest clinical correlation for mild cirrhotic change.  The liver is otherwise unremarkable in appearance.  The spleen is significantly enlarged, measuring 19.7 cm in length.  The patient is status post cholecystectomy, with clips noted along the gallbladder fossa.  The pancreas and adrenal glands are unremarkable in appearance.  Significant nonspecific perinephric stranding is noted bilaterally. There is minimal right-sided pelvicaliectasis, likely within normal limits.  No definite obstructing ureteral stones are identified. No nonobstructing renal stones are seen.  A tiny calcification anterior to the left renal hilum is thought to be vascular in nature.  The degree of perinephric stranding is mildly worsened from the prior CT.  No free fluid is identified.  The small bowel remains normal in caliber, without evidence of obstruction.  A tiny diverticulum is noted at the gastric fundus; the stomach is partially filled with contrast and is otherwise unremarkable in appearance.  No acute vascular abnormalities are seen. Scattered calcification is noted along the abdominal aorta and its branches.  This is particularly prominent along the distal abdominal aorta and common iliac arteries bilaterally.  The patient's left lower quadrant colostomy is unremarkable in appearance; the Hartmann's pouch contains a small amount of contrast and stool, and remains within normal limits.  The remaining colon is partially filled with stool.  Note is made of two relatively short loops of contrast-filled small bowel within the left lower quadrant colostomy defect, likely the mid ileum, without evidence of associated soft tissue inflammation.  The bladder is moderately distended and grossly unremarkable in appearance.  The patient  is status post hysterectomy; the right ovary is unremarkable in appearance.  No suspicious adnexal masses are seen.  No inguinal lymphadenopathy is seen.  A tiny anterior abdominal wall hernia is noted  anterior to the liver, containing only fat.  Mild diffuse soft tissue inflammation and skin thickening are noted along the patient's pannus, raising question for sequelae of mild panniculitis.  No acute osseous abnormalities are identified.  IMPRESSION:  1.  No acute abnormalities seen within the abdomen or pelvis. 2.  Left lower quadrant colostomy grossly unremarkable in appearance.  Two short segments of small bowel are noted within the colostomy defect, likely reflecting mid ileum, without evidence for obstruction or soft tissue inflammation. 3.  Marked splenomegaly. 4.  Slightly nodular contour to the liver; suggest clinical correlation for mild cirrhotic change. 5.  Nonspecific perinephric stranding noted bilaterally, without definite evidence of pyelonephritis; this is mildly more prominent than in 2007. 6.  Scattered calcification along the abdominal aorta and its branches, particularly prominent along the distal abdominal aorta and the common iliac arteries bilaterally. 7.  Tiny anterior abdominal wall hernia anterior to the liver, containing only fat. 8.  Mild diffuse soft tissue inflammation and skin thickening along the pannus, raising question for sequelae of mild panniculitis.  Original Report Authenticated By: Tonia Ghent, M.D.   Dg Abd Acute W/chest  03/17/2011  *RADIOLOGY REPORT*  Clinical Data: Gastrointestinal bleeding.  Abdominal pain.  Nausea.  ACUTE ABDOMEN SERIES (ABDOMEN 2 VIEW & CHEST 1 VIEW)  Comparison: Chest radiograph 11/09/2005.  CT abdomen 04/03/2005.  Findings: Cardiopericardial silhouette appears within normal limits.  Pulmonary parenchymal scarring is present in a basilar and perihilar distribution which appears unchanged compared to the prior exam.  Suboptimal evaluation of the abdomen due to obese body habitus.  The right abdominal wall is clipped. Cholecystectomy clips are present in the right upper quadrant.  Stomach is distended with food.  The bowel gas is nonobstructive.   There is no gas identified in the rectosigmoid, but reportedly the patient has an ostomy.  Ostomy appliance is not identified radiographically. There is no plain film evidence of free air on decubitus imaging.  IMPRESSION:  1.  Chronic changes of the chest without acute cardiopulmonary disease. 2.  No acute abnormality in the abdomen.  Overall nonobstructive bowel gas pattern allowing for reported presence of ostomy. 3.  Technically suboptimal study secondary to body habitus.  Original Report Authenticated By: Andreas Newport, M.D.     Disposition: Home  Diet: Diabetic diet  Activity: Resume as tolerated   Follow-up Appts: Discharge Orders    Future Orders Please Complete By Expires   Diet Carb Modified      Increase activity slowly      Discharge instructions      Comments:   Followup with [name], MD (PCP) in 1 week. Followup with your gastroenterologist Dr. [name] in 1 week. Dr. Arlyce Dice to call you with results of capsule endoscopy.      TESTS THAT NEED FOLLOW-UP Gastroenterology will call the patient in regards to capsule endoscopy.  Time spent on discharge, talking to the patient, and coordinating care: 35 mins.   Signed: Cristal Ford, MD 03/21/2011, 11:56 AM

## 2011-03-28 ENCOUNTER — Encounter: Payer: Self-pay | Admitting: Gastroenterology

## 2011-11-18 LAB — PULMONARY FUNCTION TEST

## 2012-02-07 ENCOUNTER — Ambulatory Visit (INDEPENDENT_AMBULATORY_CARE_PROVIDER_SITE_OTHER): Payer: Medicare Other | Admitting: Family Medicine

## 2012-02-07 ENCOUNTER — Encounter: Payer: Self-pay | Admitting: Family Medicine

## 2012-02-07 VITALS — BP 154/62 | HR 83 | Temp 98.5°F | Wt 182.0 lb

## 2012-02-07 DIAGNOSIS — J45909 Unspecified asthma, uncomplicated: Secondary | ICD-10-CM

## 2012-02-07 DIAGNOSIS — R51 Headache: Secondary | ICD-10-CM

## 2012-02-07 DIAGNOSIS — J449 Chronic obstructive pulmonary disease, unspecified: Secondary | ICD-10-CM

## 2012-02-07 DIAGNOSIS — K922 Gastrointestinal hemorrhage, unspecified: Secondary | ICD-10-CM

## 2012-02-07 DIAGNOSIS — M25569 Pain in unspecified knee: Secondary | ICD-10-CM

## 2012-02-07 DIAGNOSIS — E119 Type 2 diabetes mellitus without complications: Secondary | ICD-10-CM

## 2012-02-07 DIAGNOSIS — F329 Major depressive disorder, single episode, unspecified: Secondary | ICD-10-CM

## 2012-02-07 DIAGNOSIS — M25561 Pain in right knee: Secondary | ICD-10-CM

## 2012-02-07 DIAGNOSIS — I10 Essential (primary) hypertension: Secondary | ICD-10-CM

## 2012-02-07 DIAGNOSIS — D649 Anemia, unspecified: Secondary | ICD-10-CM

## 2012-02-07 DIAGNOSIS — K7460 Unspecified cirrhosis of liver: Secondary | ICD-10-CM

## 2012-02-07 DIAGNOSIS — C3490 Malignant neoplasm of unspecified part of unspecified bronchus or lung: Secondary | ICD-10-CM

## 2012-02-07 DIAGNOSIS — D696 Thrombocytopenia, unspecified: Secondary | ICD-10-CM

## 2012-02-07 LAB — CBC WITH DIFFERENTIAL/PLATELET
Basophils Relative: 0.5 % (ref 0.0–3.0)
Eosinophils Relative: 1.3 % (ref 0.0–5.0)
HCT: 34 % — ABNORMAL LOW (ref 36.0–46.0)
Lymphs Abs: 0.4 10*3/uL — ABNORMAL LOW (ref 0.7–4.0)
MCHC: 33.7 g/dL (ref 30.0–36.0)
MCV: 92.4 fl (ref 78.0–100.0)
Monocytes Absolute: 0.2 10*3/uL (ref 0.1–1.0)
Platelets: 51 10*3/uL — ABNORMAL LOW (ref 150.0–400.0)
WBC: 3.2 10*3/uL — ABNORMAL LOW (ref 4.5–10.5)

## 2012-02-07 MED ORDER — PROMETHAZINE HCL 25 MG PO TABS: 25.0000 mg | ORAL_TABLET | Freq: Four times a day (QID) | ORAL | Status: DC | PRN

## 2012-02-07 MED ORDER — ESOMEPRAZOLE MAGNESIUM 40 MG PO CPDR: 40.0000 mg | DELAYED_RELEASE_CAPSULE | Freq: Every day | ORAL | Status: DC

## 2012-02-07 MED ORDER — FLUTICASONE-SALMETEROL 250-50 MCG/DOSE IN AEPB: 1.0000 | INHALATION_SPRAY | Freq: Two times a day (BID) | RESPIRATORY_TRACT | Status: DC

## 2012-02-07 MED ORDER — OXYCODONE HCL 20 MG PO TABS: 20.0000 mg | ORAL_TABLET | ORAL | Status: DC | PRN

## 2012-02-07 NOTE — Progress Notes (Signed)
  Subjective:    Patient ID: [name], female    DOB: 1950/11/06, 62 y.o.   MRN: [id number]  HPI Here to re-establish with me after an absence of 5 and 1/2 years. She had been living in [place], [place] and had been seeing a number of physicians there. She recently moved back to [place] to live with her daughter, who accompanies her today. Prior to her move she had been seeing the following physicians: Dr. Yolanda Manges (PCP), Dr. [name] (GI in [place], [place]), Dr. Clydie Braun (Pulmonary), Dr. Samuel Bouche (Endocrine), Dr. Verda Cumins (Oncology), and Dr. Coralie Carpen (Surgery). The major issues that she has been dealing with the past 2 years is her Stage 4 liver cirrhosis which is causing ascites, lower extremity edema, and splenic congestion. This has led to sequestration and chronic thrombocytopenia. Her last platelet count from 01-09-12 was 64, which is stable for her. She has also developed a chronic lower GI bleed which causes large blood clots to show up in her ostomy bag several times a day and which causes her to be anemic. Her last Hgb from 01-09-12 was 11.4. The source of this bleed has been elusive, and it has not been found on upper or lower endoscopies or on a capsule study. It is presumed to be in the small bowel, and her GI doctor has been trying to refer her to a GI center at [facility] or at [facility] for a small bowel endoscopy. Her diabetes has not been well controlled, and her random glucoses at home range from 125 to 300. She does not remember what her last A1c result was. 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 Everyone agrees that with her COPD and other medical issues no surgery should be attempted unless absolutely necessary. She had a cortisone shot in the right knee a month or so ago but this did not help much. She wears [place] oxygen every night in bed but only rarely uses this during the day. Today she needs me to write her some med refills, and she needs to be referred to  numerous specialists here in [place]. She has had a flu shot this winter.    Review of Systems  Constitutional: Positive for fatigue.  Respiratory: Positive for chest tightness, shortness of breath and wheezing. Negative for cough.   Cardiovascular: Positive for leg swelling. Negative for chest pain and palpitations.  Gastrointestinal: Positive for abdominal pain, blood in stool and abdominal distention. Negative for nausea, vomiting, diarrhea and constipation.  Genitourinary: Positive for urgency and frequency. Negative for dysuria and hematuria.  Musculoskeletal: Positive for joint swelling and arthralgias.       Objective:   Physical Exam  Constitutional:       Walks with a cane, alert   Neck: No thyromegaly present.  Cardiovascular: Normal rate, regular rhythm, normal heart sounds and intact distal pulses.   Pulmonary/Chest: Effort normal and breath sounds normal. No respiratory distress. She has no wheezes. She has no rales.  Abdominal: Bowel sounds are normal. She exhibits distension. There is no tenderness. There is no rebound and no guarding.  Lymphadenopathy:    She has no cervical adenopathy.          Assessment & Plan:  Here to re-establish with [place] for primary care and to get established with numerous specialists. She will continue with the GI above until he can get her in with a GI at a [facility] as above. Meds were refilled. Check a CBC.

## 2012-02-10 ENCOUNTER — Telehealth: Payer: Self-pay | Admitting: Internal Medicine

## 2012-02-10 NOTE — Telephone Encounter (Signed)
S/W pt in re NP appt 01/29 @ 9:30 w/Dr. Arbutus Ped.  Referring Dr. Clent Ridges Dx-Thrombocytopenia  Welcome packet mailed.

## 2012-02-11 NOTE — Progress Notes (Signed)
Quick Note:  I spoke with pt and gave results, also pt wants to come here for treatment of diabetes, per Dr. Clent Ridges okay. ______

## 2012-02-14 ENCOUNTER — Encounter (INDEPENDENT_AMBULATORY_CARE_PROVIDER_SITE_OTHER): Payer: Self-pay | Admitting: General Surgery

## 2012-02-14 ENCOUNTER — Ambulatory Visit (INDEPENDENT_AMBULATORY_CARE_PROVIDER_SITE_OTHER): Payer: Medicare Other | Admitting: General Surgery

## 2012-02-14 ENCOUNTER — Telehealth (INDEPENDENT_AMBULATORY_CARE_PROVIDER_SITE_OTHER): Payer: Self-pay

## 2012-02-14 ENCOUNTER — Ambulatory Visit (INDEPENDENT_AMBULATORY_CARE_PROVIDER_SITE_OTHER): Payer: Self-pay | Admitting: General Surgery

## 2012-02-14 VITALS — BP 142/78 | HR 72 | Temp 97.3°F | Resp 18 | Ht 60.0 in | Wt 180.0 lb

## 2012-02-14 DIAGNOSIS — K922 Gastrointestinal hemorrhage, unspecified: Secondary | ICD-10-CM

## 2012-02-14 NOTE — Progress Notes (Signed)
Patient ID: [name], female   DOB: 08/25/1950, 62 y.o.   MRN: [id number]  Chief Complaint  Patient presents with  . GI Bleeding    HPI [name] is a 62 y.o. female.   HPI Pt is 62 year old female who presents to establish surgical care in [facility].  She has recurrent GI bleeds and anemia.  She has had many small bleeds with frequent maroon ostomy output.  She periodically will get bright red clots and frank blood, the last occurrence was in July.  She has cirrhosis and has had many studies for workup.  She has had negative colonoscopy, capsule endoscopy, and her EGD showed some gastropathy, but no lesions to attribute such a large blood loss from. Her last workup in our system was in February 2013, but she had additional studies performed in [place], [place].  She has lived there until recently, but her daughter lives here and she spends a fair amount of time here.  I cannot find evidence that a bleeding scan or arteriogram has been performed. This is complicated by her cirrhosis, ascites, thrombocytopenia, and COPD with poor operative mortality.  She    Past Medical History  Diagnosis Date  . Cirrhosis   . Diabetes mellitus   . GERD (gastroesophageal reflux disease)   . Cervical disc syndrome   . COPD (chronic obstructive pulmonary disease)   . Lung cancer     squamous cell  . Chronic respiratory failure   . Diverticulitis of colon   . Perforation of colon   . Arthritis   . Anemia   . IBS (irritable bowel syndrome)   . Chronic lower GI bleeding   . Overactive bladder   . Cervical cancer   . Thrombocytopenia   . Splenomegaly   . Anxiety   . Depression   . Hypertension   . Asthma   . Chronic headache disorder   . Blood transfusion without reported diagnosis   . Heart murmur   . Hyperlipidemia     Past Surgical History  Procedure Date  . Appendectomy   . Cholecystectomy   . Colostomy   . Pneumonectomy   . Cervix removed   . Bowel resection   .  Esophagogastroduodenoscopy 03/19/2011    Procedure: ESOPHAGOGASTRODUODENOSCOPY (EGD);  Surgeon: Louis Meckel, MD;  Location: [facility];  Service: Endoscopy;  Laterality: N/A;  . Colonoscopy 03/19/2011    Procedure: COLONOSCOPY;  Surgeon: Louis Meckel, MD;  Location: [facility];  Service: Endoscopy;  Laterality: N/A;  . Givens capsule study 03/20/2011    Procedure: GIVENS CAPSULE STUDY;  Surgeon: Louis Meckel, MD;  Location: [facility];  Service: Endoscopy;  Laterality: N/A;  . Small bowel obstruction repair March 2012  . Umbilical hernia repair March 2012    Family History  Problem Relation Age of Onset  . Coronary artery disease    . Diabetes type II    . Anesthesia problems Neg Hx   . Hypotension Neg Hx   . Malignant hyperthermia Neg Hx   . Pseudochol deficiency Neg Hx     Social History History  Substance Use Topics  . Smoking status: Former Smoker    Quit date: 03/24/2000  . Smokeless tobacco: Never Used  . Alcohol Use: No    Allergies  Allergen Reactions  . Cephalexin     REACTION: unspecified  . Codeine Phosphate     REACTION: unspecified  . Hydrocodone-Acetaminophen     REACTION: unspecified  . Morphine   .  Morphine Sulfate     REACTION: unspecified  . Penicillins     REACTION: unspecified    Current Outpatient Prescriptions  Medication Sig Dispense Refill  . albuterol-ipratropium (COMBIVENT) 18-103 MCG/ACT inhaler Inhale 2 puffs into the lungs every 4 (four) hours as needed. For asthma      . cholecalciferol (VITAMIN D) 1000 UNITS tablet Take 2,000 Units by mouth daily.       . diazepam (VALIUM) 5 MG tablet Take 5 mg by mouth 3 (three) times daily. For anxiety      . dicyclomine (BENTYL) 10 MG capsule Take 10 mg by mouth 3 (three) times daily.       [name] esomeprazole (NEXIUM) 40 MG capsule Take 1 capsule (40 mg total) by mouth daily before breakfast.  30 capsule  11  . exenatide (BYETTA) 5 MCG/0.02ML SOLN Inject 5 mcg into the skin 3 (three) times  daily.       . Fluticasone-Salmeterol (ADVAIR) 250-50 MCG/DOSE AEPB Inhale 1 puff into the lungs every 12 (twelve) hours.  60 each  11  . folic acid (FOLVITE) 1 MG tablet Take 1 mg by mouth daily.      . furosemide (LASIX) 20 MG tablet Take 20 mg by mouth 2 (two) times daily.      [name] glimepiride (AMARYL) 4 MG tablet Take 4 mg by mouth daily before breakfast. If glucose level is 200 or above take 5 mg total      . metFORMIN (GLUCOPHAGE) 500 MG tablet Take 1,000 mg by mouth 2 (two) times daily with a meal.      . metoCLOPramide (REGLAN) 10 MG tablet Take 10 mg by mouth 3 (three) times daily.      . Oxycodone HCl 20 MG TABS Take 1 tablet (20 mg total) by mouth every 3 (three) hours as needed (pain).  240 tablet  0  . promethazine (PHENERGAN) 25 MG tablet Take 1 tablet (25 mg total) by mouth every 6 (six) hours as needed for nausea. For nausea  120 tablet  5  . rifaximin (XIFAXAN) 550 MG TABS Take 550 mg by mouth 2 (two) times daily.      [name] tolterodine (DETROL LA) 2 MG 24 hr capsule Take 2 mg by mouth daily.      . traZODone (DESYREL) 100 MG tablet Take 200 mg by mouth at bedtime.         Review of Systems Review of Systems  Respiratory: Positive for cough.   Cardiovascular: Positive for leg swelling.  Gastrointestinal: Positive for nausea, abdominal pain, diarrhea, constipation, blood in stool, abdominal distention and anal bleeding.  Musculoskeletal: Positive for arthralgias.  Neurological: Positive for weakness and headaches.  Hematological: Bruises/bleeds easily.    Blood pressure 142/78, pulse 72, temperature 97.3 F (36.3 C), temperature source Oral, resp. rate 18, height 5' (1.524 m), weight 180 lb (81.647 kg).  Physical Exam Physical Exam  Constitutional: She appears well-developed and well-nourished. No distress.       Looks chronically ill  HENT:  Head: Normocephalic and atraumatic.  Eyes: Conjunctivae normal are normal. Pupils are equal, round, and reactive to light. Right eye  exhibits no discharge. Left eye exhibits no discharge. No scleral icterus.  Neck: Normal range of motion. Neck supple. No tracheal deviation present. No thyromegaly present.  Cardiovascular: Normal rate, regular rhythm, normal heart sounds and intact distal pulses.  Exam reveals no gallop and no friction rub.   No murmur heard. Pulmonary/Chest: Effort normal and breath sounds normal. No  respiratory distress. She has no wheezes. She has no rales. She exhibits no tenderness.  Abdominal: Soft. Bowel sounds are normal. She exhibits distension. She exhibits no mass. There is tenderness. There is no rebound and no guarding. A hernia is present. Hernia confirmed positive in the ventral area.       Large parastomal hernia and midline tenderness  Musculoskeletal: Normal range of motion.  Lymphadenopathy:    She has no cervical adenopathy.  Skin: She is not diaphoretic.    Data Reviewed Labs and studies in the cone system reviewed.  Splenomegaly with many venous collaterals  Assessment    Presumed Lower GI bleed    Plan    Agree with no current surgical intervention.  No target to remove. 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   Would like to get bleeding scan or arteriogram while actively bleeding.  Other option is small bowel endoscopy with cautery.  Agree with that plan in intervening time.    30 min spent in exam and counseling.           Reda Citron 02/14/2012, 2:52 PM

## 2012-02-14 NOTE — Telephone Encounter (Signed)
Mailed Release of Information to pt.  She will fill it out and mail it back.  Records will be requested from Dr. Rush Landmark in [place], [place].

## 2012-02-14 NOTE — Patient Instructions (Signed)
Ask Dr. Claude Manges office to fax records to [place].  Follow up as needed.  If you have severe bleeding, go to hospital as soon as possible.

## 2012-02-17 ENCOUNTER — Ambulatory Visit: Payer: Medicare Other | Admitting: Internal Medicine

## 2012-02-21 ENCOUNTER — Institutional Professional Consult (permissible substitution): Payer: Medicare Other | Admitting: Pulmonary Disease

## 2012-02-25 ENCOUNTER — Other Ambulatory Visit: Payer: Self-pay | Admitting: Medical Oncology

## 2012-02-25 DIAGNOSIS — D696 Thrombocytopenia, unspecified: Secondary | ICD-10-CM

## 2012-02-26 ENCOUNTER — Ambulatory Visit: Payer: Medicare Other | Admitting: Internal Medicine

## 2012-02-26 ENCOUNTER — Ambulatory Visit: Payer: Medicare Other

## 2012-02-26 ENCOUNTER — Other Ambulatory Visit: Payer: Medicare Other | Admitting: Lab

## 2012-02-26 ENCOUNTER — Telehealth: Payer: Self-pay | Admitting: *Deleted

## 2012-02-26 NOTE — Telephone Encounter (Signed)
Per patient voicemail, she wanted to canel her appts. Her transportation is running late. This is a new patient appt. Message given to Tiffany.  JMW

## 2012-03-03 ENCOUNTER — Telehealth: Payer: Self-pay | Admitting: Oncology

## 2012-03-03 NOTE — Telephone Encounter (Signed)
Pt called to r/s NP appt  02/24 @ 9:30 w/Dr. Gaylyn Rong.  Calendar mailed.

## 2012-03-09 ENCOUNTER — Ambulatory Visit (INDEPENDENT_AMBULATORY_CARE_PROVIDER_SITE_OTHER): Payer: Medicare Other | Admitting: Family Medicine

## 2012-03-09 ENCOUNTER — Encounter: Payer: Self-pay | Admitting: Family Medicine

## 2012-03-09 VITALS — BP 154/70 | HR 105 | Temp 98.8°F | Wt 185.0 lb

## 2012-03-09 DIAGNOSIS — I10 Essential (primary) hypertension: Secondary | ICD-10-CM

## 2012-03-09 DIAGNOSIS — H538 Other visual disturbances: Secondary | ICD-10-CM

## 2012-03-09 DIAGNOSIS — F329 Major depressive disorder, single episode, unspecified: Secondary | ICD-10-CM

## 2012-03-09 DIAGNOSIS — E119 Type 2 diabetes mellitus without complications: Secondary | ICD-10-CM

## 2012-03-09 DIAGNOSIS — K7460 Unspecified cirrhosis of liver: Secondary | ICD-10-CM

## 2012-03-09 DIAGNOSIS — J449 Chronic obstructive pulmonary disease, unspecified: Secondary | ICD-10-CM

## 2012-03-09 MED ORDER — OXYCODONE HCL 20 MG PO TABS: 20.0000 mg | ORAL_TABLET | ORAL | Status: DC | PRN

## 2012-03-09 MED ORDER — DICYCLOMINE HCL 10 MG PO CAPS: 10.0000 mg | ORAL_CAPSULE | Freq: Three times a day (TID) | ORAL | Status: DC

## 2012-03-09 MED ORDER — FUROSEMIDE 20 MG PO TABS: 20.0000 mg | ORAL_TABLET | Freq: Two times a day (BID) | ORAL | Status: DC

## 2012-03-09 MED ORDER — EXENATIDE 5 MCG/0.02ML ~~LOC~~ SOPN: 5.0000 ug | PEN_INJECTOR | Freq: Three times a day (TID) | SUBCUTANEOUS | Status: DC

## 2012-03-09 MED ORDER — GLIMEPIRIDE 4 MG PO TABS: ORAL_TABLET | ORAL | Status: DC

## 2012-03-09 MED ORDER — TRAZODONE HCL 100 MG PO TABS: 200.0000 mg | ORAL_TABLET | Freq: Every day | ORAL | Status: DC

## 2012-03-09 MED ORDER — TOLTERODINE TARTRATE 2 MG PO TABS: 2.0000 mg | ORAL_TABLET | Freq: Two times a day (BID) | ORAL | Status: DC

## 2012-03-09 NOTE — Progress Notes (Signed)
  Subjective:    Patient ID: [name], female    DOB: [birth year], 62 y.o.   MRN: [id number]  HPI Here for refills. She saw Dr. Donell Beers who wanted to wait and see what her small bowel endoscopy showed. This is set up for 04-15-12 at [facility]. In general she feels well except for generalized fatigue. Her Hgb last month was actually good at 11.5.    Review of Systems  Constitutional: Negative.   Respiratory: Negative.   Cardiovascular: Negative.   Gastrointestinal: Positive for blood in stool. Negative for abdominal pain and abdominal distention.       Objective:   Physical Exam  Constitutional: She appears well-developed and well-nourished.  Cardiovascular: Normal rate, regular rhythm, normal heart sounds and intact distal pulses.   Pulmonary/Chest: Effort normal and breath sounds normal.          Assessment & Plan:  meds were refilled. We await the endoscopy results. Refer to Ophthalmology.

## 2012-03-10 ENCOUNTER — Ambulatory Visit (INDEPENDENT_AMBULATORY_CARE_PROVIDER_SITE_OTHER): Payer: Medicare Other | Admitting: Pulmonary Disease

## 2012-03-10 ENCOUNTER — Encounter: Payer: Self-pay | Admitting: Pulmonary Disease

## 2012-03-10 ENCOUNTER — Telehealth: Payer: Self-pay | Admitting: Family Medicine

## 2012-03-10 VITALS — BP 140/70 | HR 90 | Temp 98.3°F | Ht 60.0 in | Wt 185.0 lb

## 2012-03-10 DIAGNOSIS — J449 Chronic obstructive pulmonary disease, unspecified: Secondary | ICD-10-CM

## 2012-03-10 MED ORDER — GLIMEPIRIDE 4 MG PO TABS: 4.0000 mg | ORAL_TABLET | Freq: Every day | ORAL | Status: DC

## 2012-03-10 NOTE — Progress Notes (Signed)
  Subjective:    Patient ID: [name], female    DOB: [birth year], 62 y.o.   MRN: [id number]  HPI The patient is a 62 year old female who I've been asked to see for management of COPD.  She was diagnosed with COPD in 2007 by her history, and has had recent PFTs by her pulmonologist in [facility].  She has been maintained on Advair, as well as Combivent for rescue.  She was tried on Spiriva in the past, but saw no benefit and had side effects.  Complicating all of this is her history of cirrhosis with ascites and evidence for portal hypertension.  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  By her history, there has been no recurrence.  She is also morbidly obese and deconditioned.  She has had recurrent GI bleeding of unknown source, and is chronically anemic.  She has also had a sleep study with minimal sleep apnea by her history, but wears oxygen at night for nocturnal desaturations.  She has a long history of smoking, but has not done so since 2004.  She has had a recent chest x-ray which shows mild scarring, otherwise is unremarkable.  The patient describes dyspnea with less than one block at a moderate pace, and will get winded bringing groceries in from the car or doing light house chores.  She has mild cough with only occasional mucus production.  She has a history of chronic lower extremity edema, but it is unknown whether she has a history of heart disease.   Review of Systems  Constitutional: Negative for fever and unexpected weight change.  HENT: Positive for sneezing, dental problem and sinus pressure. Negative for ear pain, nosebleeds, congestion, sore throat, rhinorrhea, trouble swallowing and postnasal drip.   Eyes: Negative for redness and itching.  Respiratory: Positive for cough and shortness of breath. Negative for chest tightness and wheezing.   Cardiovascular: Positive for chest pain and leg swelling ( feet swelling).  Negative for palpitations.  Gastrointestinal: Positive for abdominal pain. Negative for nausea and vomiting.       Acid heartburn  Genitourinary: Negative for dysuria.  Musculoskeletal: Positive for joint swelling and arthralgias.  Skin: Negative for rash.  Neurological: Positive for headaches.  Hematological: Does not bruise/bleed easily.  Psychiatric/Behavioral: Positive for dysphoric mood. The patient is not nervous/anxious.        Objective:   Physical Exam Constitutional:  Morbidly obese female, no acute distress  HENT:  Nares patent without discharge  Oropharynx without exudate, palate and uvula are normal  Eyes:  Perrla, eomi, no scleral icterus  Neck:  No JVD, no TMG  Cardiovascular:  Normal rate, regular rhythm, no rubs or gallops.  2/6 sem        Intact distal pulses  Pulmonary :  Normal breath sounds, no stridor or respiratory distress   No rales, rhonchi, or wheezing  Abdominal:  Soft, protuberant, bowel sounds present.  No tenderness noted.  +colostomy  Musculoskeletal: mild lower extremity edema noted.  Lymph Nodes:  No cervical lymphadenopathy noted  Skin:  No cyanosis noted  Neurologic:  Alert, appropriate, moves all 4 extremities without obvious deficit.         Assessment & Plan:

## 2012-03-10 NOTE — Assessment & Plan Note (Signed)
The patient apparently has a diagnosis of COPD, and documented by pulmonary function studies from her prior pulmonologist.  She is on a good regimen at this time, and has already been tried on a LAMA with poor tolerance and no clinical efficacy.  I really have nothing to add from a pulmonary standpoint to her current regimen, but would encourage her to work aggressively on weight loss and conditioning.  Her dyspnea on exertion is clearly multifactorial, and related to her underlying cirrhosis, anemia, morbid obesity, deconditioning, and finally her underlying lung disease.  She may also have a cardiac component that is unknown to me.

## 2012-03-10 NOTE — Patient Instructions (Addendum)
Stay on current breathing medications, as well as oxygen at night Will get your recent breathing studies sent over from your previous lung doctor. Work on weight loss and conditioning. followup with me in 6mos if doing well.

## 2012-03-10 NOTE — Telephone Encounter (Signed)
I left voice message, pt should be taking Amaryl 4 mg per day. Also had to resend script to pharmacy.

## 2012-03-10 NOTE — Telephone Encounter (Signed)
I did resend script for Amaryl 4 mg and left voice message for pharmacy about the [place] ( per Dr. Clent Ridges pt can inject 3 times per day )

## 2012-03-23 ENCOUNTER — Encounter: Payer: Self-pay | Admitting: Internal Medicine

## 2012-03-23 ENCOUNTER — Telehealth: Payer: Self-pay | Admitting: Internal Medicine

## 2012-03-23 ENCOUNTER — Other Ambulatory Visit (HOSPITAL_BASED_OUTPATIENT_CLINIC_OR_DEPARTMENT_OTHER): Payer: Medicare Other | Admitting: Lab

## 2012-03-23 ENCOUNTER — Ambulatory Visit: Payer: Medicare Other

## 2012-03-23 ENCOUNTER — Ambulatory Visit (HOSPITAL_BASED_OUTPATIENT_CLINIC_OR_DEPARTMENT_OTHER): Payer: Medicare Other | Admitting: Internal Medicine

## 2012-03-23 VITALS — BP 152/61 | HR 83 | Temp 97.9°F | Resp 20 | Ht 60.0 in | Wt 185.7 lb

## 2012-03-23 DIAGNOSIS — D539 Nutritional anemia, unspecified: Secondary | ICD-10-CM | POA: Insufficient documentation

## 2012-03-23 LAB — COMPREHENSIVE METABOLIC PANEL (CC13)
ALT: 46 U/L (ref 0–55)
AST: 40 U/L — ABNORMAL HIGH (ref 5–34)
Albumin: 3.7 g/dL (ref 3.5–5.0)
Alkaline Phosphatase: 71 U/L (ref 40–150)
Calcium: 9.8 mg/dL (ref 8.4–10.4)
Chloride: 103 mEq/L (ref 98–107)
Potassium: 4.6 mEq/L (ref 3.5–5.1)
Sodium: 139 mEq/L (ref 136–145)
Total Protein: 7.2 g/dL (ref 6.4–8.3)

## 2012-03-23 LAB — CBC & DIFF AND RETIC
BASO%: 0.4 % (ref 0.0–2.0)
Basophils Absolute: 0 10*3/uL (ref 0.0–0.1)
EOS%: 2.8 % (ref 0.0–7.0)
HGB: 11.3 g/dL — ABNORMAL LOW (ref 11.6–15.9)
MCH: 31 pg (ref 25.1–34.0)
MCHC: 33.7 g/dL (ref 31.5–36.0)
MCV: 92 fL (ref 79.5–101.0)
MONO%: 7.7 % (ref 0.0–14.0)
RBC: 3.64 10*6/uL — ABNORMAL LOW (ref 3.70–5.45)
RDW: 13.3 % (ref 11.2–14.5)
Retic %: 1.41 % (ref 0.70–2.10)

## 2012-03-23 NOTE — Progress Notes (Signed)
[facility] [phone]   [phone]  CONSULT NOTE  REFERRING PHYSICIAN: Dr. Gershon Crane  REASON FOR CONSULTATION:  62 years old white female with persistent thrombocytopenia.  HPI [name] is a 62 y.o. female was past medical history significant for anemia, GI bleed, diabetes mellitus, COPD, drug-induced liver cirrhosis, , depression, history of cervical cancer as well as history of a stage IB non-small cell lung cancer diagnosed in February of 2004 status post left upper lobectomy by Dr. Dorris Fetch. I saw the patient in the past last visit was in November of 2008 before she moved to stay close to her daughter in [place]. She was treated in the past with Aranesp for anemia of chronic disease. The patient was followed by an oncologist in [facility]. She did not have any evidence for disease recurrence from her lung cancer based on the previous scans. She has a history of GI bleed and she underwent upper endoscopy and colonoscopy 2 months ago by Dr. [name] in in [facility]. There was no clear source of bleeding. The patient is scheduled to undergo endoscopy of the small intestine at [facility] on 04/15/2012. She'll return back to [place] to be close to her son. She reestablish care with Dr. Clent Ridges. The patient was noted to have persistent thrombocytopenia and was referred to me today for evaluation of her condition. She is feeling fine with no specific complaints except for abdominal pain and occasional nausea. She denied having any significant bruising or ecchymosis. She continues to have GI blood loss of unknown source. She received 5 units of packed rbc's transfusion since June of 2013. She was also treated in the past with intravenous iron infusion. The patient denied having any significant chest pain, shortness breath, cough or hemoptysis. He has no significant weight  loss or night sweats.   @SFHPI @  Past Medical History  Diagnosis Date  . Cirrhosis   . Diabetes mellitus   . GERD (gastroesophageal reflux disease)   . Cervical disc syndrome   . COPD (chronic obstructive pulmonary disease)   . Lung cancer     squamous cell  . Chronic respiratory failure   . Diverticulitis of colon   . Perforation of colon   . Arthritis   . Anemia   . IBS (irritable bowel syndrome)   . Chronic lower GI bleeding   . Overactive bladder   . Cervical cancer   . Thrombocytopenia   . Splenomegaly   . Anxiety   . Depression   . Hypertension   . Asthma   . Chronic headache disorder   . Blood transfusion without reported diagnosis   . Heart murmur   . Hyperlipidemia   . Lung cancer     Past Surgical History  Procedure Laterality Date  . Appendectomy    . Cholecystectomy    . Colostomy    . Pneumonectomy    . Cervix removed    . Bowel resection    . Esophagogastroduodenoscopy  03/19/2011    Procedure: ESOPHAGOGASTRODUODENOSCOPY (EGD);  Surgeon: Louis Meckel, MD;  Location: [facility];  Service: Endoscopy;  Laterality: N/A;  . Colonoscopy  03/19/2011    Procedure: COLONOSCOPY;  Surgeon: Louis Meckel, MD;  Location: [facility];  Service: Endoscopy;  Laterality: N/A;  . Givens capsule study  03/20/2011    Procedure: GIVENS CAPSULE STUDY;  Surgeon: Louis Meckel, MD;  Location: [facility];  Service: Endoscopy;  Laterality: N/A;  . Small bowel obstruction repair  March 2012  . Umbilical hernia repair  March 2012    Family History  Problem Relation Age of Onset  . Coronary artery disease    . Diabetes type II    . Anesthesia problems Neg Hx   . Hypotension Neg Hx   . Malignant hyperthermia Neg Hx   . Pseudochol deficiency Neg Hx     Social History History  Substance Use Topics  . Smoking status: Former Smoker -- 2.00 packs/day for 35 years    Types: Cigarettes    Quit date: 03/24/2002  . Smokeless tobacco: Never Used  . Alcohol Use:  No    Allergies  Allergen Reactions  . Codeine Phosphate     Stomach cramps  . Hydrocodone-Acetaminophen     hallucinations  . Morphine     Lowers BP  . Penicillins     Rash, tongue swelling  . Cephalexin Swelling and Rash    Current Outpatient Prescriptions  Medication Sig Dispense Refill  . albuterol-ipratropium (COMBIVENT) 18-103 MCG/ACT inhaler Inhale 2 puffs into the lungs every 4 (four) hours as needed. For asthma      . cholecalciferol (VITAMIN D) 1000 UNITS tablet Take 1,000 Units by mouth daily.       . diazepam (VALIUM) 5 MG tablet Take 5 mg by mouth 3 (three) times daily. For anxiety      . dicyclomine (BENTYL) 10 MG capsule Take 1 capsule (10 mg total) by mouth 3 (three) times daily.  90 capsule  11  . esomeprazole (NEXIUM) 40 MG capsule Take 1 capsule (40 mg total) by mouth daily before breakfast.  30 capsule  11  . exenatide (BYETTA) 5 MCG/0.02ML SOLN Inject 0.02 mLs (5 mcg total) into the skin 3 (three) times daily.  1.2 mL  11  . Fluticasone-Salmeterol (ADVAIR) 250-50 MCG/DOSE AEPB Inhale 1 puff into the lungs every 12 (twelve) hours.  60 each  11  . folic acid (FOLVITE) 1 MG tablet Take 1 mg by mouth daily.      . furosemide (LASIX) 20 MG tablet Take 1 tablet (20 mg total) by mouth 2 (two) times daily.  60 tablet  11  . glimepiride (AMARYL) 4 MG tablet Take 1 tablet (4 mg total) by mouth daily.  30 tablet  11  . metFORMIN (GLUCOPHAGE) 500 MG tablet Take 1,000 mg by mouth 2 (two) times daily with a meal.      . metoCLOPramide (REGLAN) 10 MG tablet Take 10 mg by mouth 3 (three) times daily.      . Oxycodone HCl 20 MG TABS Take 1 tablet (20 mg total) by mouth every 3 (three) hours as needed (pain).  240 tablet  0  . OXYGEN-HELIUM IN Inhale 2 L/min into the lungs. Use at bedtime or anytime laying flat      . potassium chloride (MICRO-K) 10 MEQ CR capsule Take 1 capsule by mouth daily.      . promethazine (PHENERGAN) 25 MG tablet Take 1 tablet (25 mg total) by mouth every  6 (six) hours as needed for nausea. For nausea  120 tablet  5  . rifaximin (XIFAXAN) 550 MG TABS Take 550 mg by mouth 2 (two) times daily.      [name] tolterodine (DETROL) 2 MG tablet Take 2 mg by mouth daily.      . traZODone (DESYREL) 100 MG tablet Take 2 tablets (200 mg total) by mouth at bedtime.  60 tablet  11  No current facility-administered medications for this visit.    Review of Systems  A comprehensive review of systems was negative except for: Constitutional: positive for fatigue Gastrointestinal: positive for abdominal pain, melena and nausea  Physical Exam  ZOX:WRUEA, healthy, no distress, well nourished and well developed SKIN: skin color, texture, turgor are normal HEAD: Normocephalic, No masses, lesions, tenderness or abnormalities EYES: normal, PERRLA EARS: External ears normal OROPHARYNX:no exudate and no erythema  NECK: supple, no adenopathy LYMPH:  no palpable lymphadenopathy, no hepatosplenomegaly BREAST:not examined LUNGS: clear to auscultation  HEART: regular rate & rhythm and no murmurs ABDOMEN: Tenderness to palpation in the right upper quadrant and epigastric area. BACK: Back symmetric, no curvature. EXTREMITIES:no edema, no skin discoloration  NEURO: alert & oriented x 3 with fluent speech, no focal motor/sensory deficits  PERFORMANCE STATUS: ECOG 1  LABORATORY DATA: Lab Results  Component Value Date   WBC 3.2* 02/07/2012   HGB 11.5* 02/07/2012   HCT 34.0* 02/07/2012   MCV 92.4 02/07/2012   PLT 51.0* 02/07/2012      Chemistry      Component Value Date/Time   NA 139 03/23/2012 0938   NA 142 03/20/2011 0500   K 4.6 03/23/2012 0938   K 4.3 03/20/2011 0500   CL 103 03/23/2012 0938   CL 110 03/20/2011 0500   CO2 25 03/23/2012 0938   CO2 24 03/20/2011 0500   BUN 14.3 03/23/2012 0938   BUN 6 03/20/2011 0500   CREATININE 1.2* 03/23/2012 0938   CREATININE 0.97 03/20/2011 0500      Component Value Date/Time   CALCIUM 9.8 03/23/2012 0938   CALCIUM 8.1*  03/20/2011 0500   ALKPHOS 71 03/23/2012 0938   ALKPHOS 42 03/20/2011 0500   AST 40* 03/23/2012 0938   AST 26 03/20/2011 0500   ALT 46 03/23/2012 0938   ALT 30 03/20/2011 0500   BILITOT 0.53 03/23/2012 0938   BILITOT 0.3 03/20/2011 0500       RADIOGRAPHIC STUDIES: No results found.  ASSESSMENT: This is a very pleasant 62 years old white female with history of stage IB non-small cell lung cancer diagnosed in February 2004 status post left upper lobectomy and has been observation since that time was no evidence for disease recurrence. 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 She also has persistent anemia of chronic disease plus/minus iron deficiency secondary to GI bleed.  PLAN: I have a lengthy discussion with the patient today about her condition. I recommended for her the following: 1) continue on observation with annual chest x-ray for evaluation of her history of lung cancer. 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 The patient is currently asymptomatic except for the GI blood loss of unknown source. Unfortunately there is no active treatment for thrombocytopenia secondary to liver cirrhosis except for symptomatic management and transfusion of platelets if less than 20,000 or significant bleeding. 3) anemia of chronic disease plus/minus iron deficiency: I ordered several studies today to evaluate her anemia including repeat CBC, comprehensive metabolic panel, LDH, iron study as well as erythropoietin level and serum protein electrophoreses. If there is any significant iron deficiency, I will arrange for the patient intravenous iron infusion.  4) the patient would come back for followup visit in 2 months for reevaluation with repeat CBC and iron study. She was advised to call immediately if she has any concerning symptoms in the  interval.  All questions were answered. The patient knows to call the clinic with any problems, questions or concerns. We can certainly see the patient much sooner if necessary.  Thank you so much for allowing me to participate in the care of [name]. I will continue to follow up the patient with you and assist in her care.  I spent 25 minutes counseling the patient face to face. The total time spent in the appointment was 50 minutes.  Renisha Cockrum K. 03/23/2012, 11:09 AM

## 2012-03-23 NOTE — Patient Instructions (Signed)
You have thrombocytopenia secondary to drug-induced liver cirrhosis. We'll continue to monitor by observation. He also has anemia of chronic disease plus/minus deficiency. History of lung cancer. We'll continue to monitor by observation. Followup in 2 months

## 2012-03-23 NOTE — Progress Notes (Signed)
Medicaid transportation verification faxed to [phone]

## 2012-03-23 NOTE — Progress Notes (Signed)
Checked in new patient. No financial issues. °

## 2012-03-23 NOTE — Telephone Encounter (Signed)
Gave pt appt for April 2014 lab before MD

## 2012-03-25 LAB — ERYTHROPOIETIN: Erythropoietin: 72.4 m[IU]/mL — ABNORMAL HIGH (ref 2.6–18.5)

## 2012-03-25 LAB — SPEP & IFE WITH QIG
Albumin ELP: 59.1 % (ref 55.8–66.1)
IgA: 310 mg/dL (ref 69–380)
IgG (Immunoglobin G), Serum: 1110 mg/dL (ref 690–1700)
Total Protein, Serum Electrophoresis: 6.8 g/dL (ref 6.0–8.3)

## 2012-03-25 LAB — IRON AND TIBC: TIBC: 335 ug/dL (ref 250–470)

## 2012-04-14 DIAGNOSIS — Z8541 Personal history of malignant neoplasm of cervix uteri: Secondary | ICD-10-CM | POA: Insufficient documentation

## 2012-04-14 DIAGNOSIS — Z933 Colostomy status: Secondary | ICD-10-CM | POA: Insufficient documentation

## 2012-04-14 DIAGNOSIS — Z87898 Personal history of other specified conditions: Secondary | ICD-10-CM | POA: Insufficient documentation

## 2012-04-22 ENCOUNTER — Ambulatory Visit (INDEPENDENT_AMBULATORY_CARE_PROVIDER_SITE_OTHER): Payer: Medicare Other | Admitting: Family Medicine

## 2012-04-22 ENCOUNTER — Encounter: Payer: Self-pay | Admitting: Family Medicine

## 2012-04-22 VITALS — BP 150/78 | HR 87 | Temp 98.3°F | Wt 180.0 lb

## 2012-04-22 DIAGNOSIS — I10 Essential (primary) hypertension: Secondary | ICD-10-CM

## 2012-04-22 DIAGNOSIS — J449 Chronic obstructive pulmonary disease, unspecified: Secondary | ICD-10-CM

## 2012-04-22 DIAGNOSIS — F329 Major depressive disorder, single episode, unspecified: Secondary | ICD-10-CM

## 2012-04-22 DIAGNOSIS — K219 Gastro-esophageal reflux disease without esophagitis: Secondary | ICD-10-CM

## 2012-04-22 DIAGNOSIS — K922 Gastrointestinal hemorrhage, unspecified: Secondary | ICD-10-CM

## 2012-04-22 DIAGNOSIS — E119 Type 2 diabetes mellitus without complications: Secondary | ICD-10-CM

## 2012-04-22 DIAGNOSIS — K7460 Unspecified cirrhosis of liver: Secondary | ICD-10-CM

## 2012-04-22 DIAGNOSIS — J45909 Unspecified asthma, uncomplicated: Secondary | ICD-10-CM

## 2012-04-22 DIAGNOSIS — D539 Nutritional anemia, unspecified: Secondary | ICD-10-CM

## 2012-04-22 DIAGNOSIS — D696 Thrombocytopenia, unspecified: Secondary | ICD-10-CM

## 2012-04-22 MED ORDER — METOCLOPRAMIDE HCL 10 MG PO TABS: 10.0000 mg | ORAL_TABLET | Freq: Three times a day (TID) | ORAL | Status: DC

## 2012-04-22 MED ORDER — DIAZEPAM 5 MG PO TABS: 5.0000 mg | ORAL_TABLET | Freq: Three times a day (TID) | ORAL | Status: DC

## 2012-04-22 MED ORDER — OXYCODONE HCL 20 MG PO TABS: 20.0000 mg | ORAL_TABLET | ORAL | Status: DC | PRN

## 2012-04-22 NOTE — Progress Notes (Signed)
  Subjective:    Patient ID: [name], female    DOB: 05-03-1950, 62 y.o.   MRN: [id number]  HPI Here to follow up on multiple issues. She has seen Dr. Shelle Iron and Dr. Arbutus Ped to get established. She had her Upper Device-Assisted Enteroscopy at [facility] on 04-15-12 and no active bleeding was found. However she has portal hypertensive gastropathy, and almost certainly this is the source of the periodic bleeding she sees. Her BP and glucoses have been stable. She feels fairly well in general.    Review of Systems  Constitutional: Positive for fatigue.  Respiratory: Negative.   Cardiovascular: Negative.   Gastrointestinal: Positive for blood in stool. Negative for nausea, vomiting, abdominal pain, diarrhea, constipation, abdominal distention, anal bleeding and rectal pain.       Objective:   Physical Exam  Constitutional: She appears well-developed and well-nourished.  Cardiovascular: Normal rate, regular rhythm, normal heart sounds and intact distal pulses.   Pulmonary/Chest: Effort normal and breath sounds normal.          Assessment & Plan:  She seems to be stable at this point. We will refer her to see Dr. Arlyce Dice again. Recheck in 2 months

## 2012-05-05 ENCOUNTER — Telehealth: Payer: Self-pay | Admitting: Family Medicine

## 2012-05-05 NOTE — Telephone Encounter (Signed)
Rec'd from [facility] forward 193 pages to Dr.Fry 05/05/12

## 2012-05-08 ENCOUNTER — Telehealth: Payer: Self-pay | Admitting: Family Medicine

## 2012-05-08 DIAGNOSIS — K922 Gastrointestinal hemorrhage, unspecified: Secondary | ICD-10-CM

## 2012-05-08 NOTE — Telephone Encounter (Signed)
She just needs to schedule a lab draw

## 2012-05-08 NOTE — Telephone Encounter (Signed)
I spoke with pt  

## 2012-05-08 NOTE — Telephone Encounter (Signed)
Patient Information:  Caller Name: [name]  Phone: [phone]  Patient: [name]  Gender: Female  DOB: [birth year]  Age: 62 Years  PCP: Gershon Crane [facility])  Office Follow Up:  Does the office need to follow up with this patient?: Yes  Instructions For The Office: appointment for blood work requested by patient for 05/11/12.  RN Note:  Caller states she has filled 2 Ostomy bags 1/2 full with bright ready bleeding. Caller states this happens intermittently. Caller denies stool mixed with bleeding today. Caller is eating, drinking, voiding, and interacting within normal limits. Caller denies dizziness and lightheadedness. Caller would like to schedule blood work for Monday 05/11/12.  Symptoms  Reason For Call & Symptoms: bleeding into ostomy bag, would like blood work scheduled. Blight red bleeding  Reviewed Health History In EMR: Yes  Reviewed Medications In EMR: Yes  Reviewed Allergies In EMR: Yes  Reviewed Surgeries / Procedures: Yes  Date of Onset of Symptoms: 05/08/2012  Guideline(s) Used:  Rectal Bleeding  Disposition Per Guideline:   See Today in Office  Reason For Disposition Reached:   Blood passed alone without any stool  Advice Given:  Call Back If:  Bleeding increases in amount  Bleeding occurs 3 or more times after treatment begins  You become worse.  Patient Refused Recommendation:  Patient Will Make Own Appointment  Caller will go to the Emergency Room if she becomes worse.

## 2012-05-12 ENCOUNTER — Other Ambulatory Visit (INDEPENDENT_AMBULATORY_CARE_PROVIDER_SITE_OTHER): Payer: Medicare Other

## 2012-05-12 DIAGNOSIS — D649 Anemia, unspecified: Secondary | ICD-10-CM

## 2012-05-12 DIAGNOSIS — D539 Nutritional anemia, unspecified: Secondary | ICD-10-CM

## 2012-05-12 DIAGNOSIS — D696 Thrombocytopenia, unspecified: Secondary | ICD-10-CM

## 2012-05-12 LAB — COMPREHENSIVE METABOLIC PANEL
ALT: 34 U/L (ref 0–35)
Alkaline Phosphatase: 57 U/L (ref 39–117)
CO2: 22 mEq/L (ref 19–32)
Sodium: 137 mEq/L (ref 135–145)
Total Bilirubin: 0.7 mg/dL (ref 0.3–1.2)
Total Protein: 7 g/dL (ref 6.0–8.3)

## 2012-05-12 LAB — IRON: Iron: 80 ug/dL (ref 42–145)

## 2012-05-12 LAB — CBC WITH DIFFERENTIAL/PLATELET
Eosinophils Relative: 2 % (ref 0.0–5.0)
HCT: 32.6 % — ABNORMAL LOW (ref 36.0–46.0)
Lymphs Abs: 0.5 10*3/uL — ABNORMAL LOW (ref 0.7–4.0)
Monocytes Relative: 8.4 % (ref 3.0–12.0)
Platelets: 46 10*3/uL — CL (ref 150.0–400.0)
RBC: 3.57 Mil/uL — ABNORMAL LOW (ref 3.87–5.11)
WBC: 3.1 10*3/uL — ABNORMAL LOW (ref 4.5–10.5)

## 2012-05-12 LAB — IBC PANEL
Iron: 80 ug/dL (ref 42–145)
Saturation Ratios: 20.8 % (ref 20.0–50.0)

## 2012-05-12 LAB — FERRITIN: Ferritin: 39 ng/mL (ref 10.0–291.0)

## 2012-05-14 ENCOUNTER — Other Ambulatory Visit: Payer: Medicare Other

## 2012-05-18 NOTE — Progress Notes (Signed)
Quick Note:  I spoke with pt ______ 

## 2012-05-21 ENCOUNTER — Encounter: Payer: Self-pay | Admitting: Internal Medicine

## 2012-05-21 ENCOUNTER — Ambulatory Visit (HOSPITAL_BASED_OUTPATIENT_CLINIC_OR_DEPARTMENT_OTHER): Payer: Medicare Other | Admitting: Internal Medicine

## 2012-05-21 ENCOUNTER — Telehealth: Payer: Self-pay | Admitting: Internal Medicine

## 2012-05-21 VITALS — BP 149/72 | HR 96 | Temp 97.0°F | Resp 19 | Ht 60.0 in | Wt 183.9 lb

## 2012-05-21 DIAGNOSIS — D638 Anemia in other chronic diseases classified elsewhere: Secondary | ICD-10-CM

## 2012-05-21 DIAGNOSIS — D696 Thrombocytopenia, unspecified: Secondary | ICD-10-CM

## 2012-05-21 DIAGNOSIS — K7460 Unspecified cirrhosis of liver: Secondary | ICD-10-CM

## 2012-05-21 NOTE — Progress Notes (Signed)
[facility] [phone]   [phone]  OFFICE PROGRESS NOTE  Catherine Salisbury, MD [address] [place] [address]  DIAGNOSIS:   1) stage IB non-small cell lung cancer diagnosed in April 2004 status post left upper lobectomy by Dr. Dorris Fetch. 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 3) anemia of chronic disease plus/minus deficiency secondary to GI bleed.  PRIOR THERAPY:none  CURRENT THERAPY: observation.  INTERVAL HISTORY: [name] 62 y.o. female returns to the clinic today for 2 months followup visit. The patient is feeling fine today with no specific complaints. She denied having any significant bleeding issues, bruises or ecchymosis. She denied having any significant weight loss or night sweats. She is here today with repeat CBC for evaluation of her thrombocytopenia and anemia. She has no chest pain, shortness breath, cough or hemoptysis.   MEDICAL HISTORY: Past Medical History  Diagnosis Date  . Cirrhosis   . Diabetes mellitus   . GERD (gastroesophageal reflux disease)   . Cervical disc syndrome   . Lung cancer     squamous cell  . Chronic respiratory failure   . Diverticulitis of colon   . Perforation of colon   . Arthritis   . Anemia   . IBS (irritable bowel syndrome)   . Chronic lower GI bleeding   . Overactive bladder   . Cervical cancer   . Thrombocytopenia     sees Dr. Arbutus Ped   . Splenomegaly   . Anxiety   . Depression   . Hypertension   . Asthma   . Chronic headache disorder   . Blood transfusion without reported diagnosis   . Heart murmur   . Hyperlipidemia   . Lung cancer   . COPD (chronic obstructive pulmonary disease)     sees Dr. Shelle Iron     ALLERGIES:  is allergic to codeine phosphate; hydrocodone-acetaminophen; morphine; penicillins; and cephalexin.  MEDICATIONS:  Current Outpatient Prescriptions  Medication Sig Dispense Refill  . albuterol-ipratropium  (COMBIVENT) 18-103 MCG/ACT inhaler Inhale 2 puffs into the lungs every 4 (four) hours as needed. For asthma      . BD PEN NEEDLE NANO U/F 32G X 4 MM MISC       . cholecalciferol (VITAMIN D) 1000 UNITS tablet Take 1,000 Units by mouth daily.       . diazepam (VALIUM) 5 MG tablet Take 1 tablet (5 mg total) by mouth 3 (three) times daily. For anxiety  90 tablet  5  . dicyclomine (BENTYL) 10 MG capsule Take 1 capsule (10 mg total) by mouth 3 (three) times daily.  90 capsule  11  . esomeprazole (NEXIUM) 40 MG capsule Take 1 capsule (40 mg total) by mouth daily before breakfast.  30 capsule  11  . exenatide (BYETTA) 5 MCG/0.02ML SOLN Inject 5 mcg into the skin 2 (two) times daily with a meal.      . Fluticasone-Salmeterol (ADVAIR) 250-50 MCG/DOSE AEPB Inhale 1 puff into the lungs every 12 (twelve) hours.  60 each  11  . folic acid (FOLVITE) 1 MG tablet Take 1 mg by mouth daily.      . furosemide (LASIX) 20 MG tablet Take 20 mg by mouth 2 (two) times daily as needed.      [name] glimepiride (AMARYL) 4 MG tablet Take 1 tablet (4 mg total) by mouth daily.  30 tablet  11  . metFORMIN (GLUCOPHAGE) 500 MG tablet Take 1,000 mg by mouth 2 (two) times daily  with a meal.      . metoCLOPramide (REGLAN) 10 MG tablet Take 1 tablet (10 mg total) by mouth 3 (three) times daily.  90 tablet  11  . Oxycodone HCl 20 MG TABS Take 1 tablet (20 mg total) by mouth every 3 (three) hours as needed (pain).  240 tablet  0  . OXYGEN-HELIUM IN Inhale 2 L/min into the lungs. Use at bedtime or anytime laying flat      . potassium chloride (MICRO-K) 10 MEQ CR capsule Take 1 capsule by mouth daily.      . promethazine (PHENERGAN) 25 MG tablet Take 1 tablet (25 mg total) by mouth every 6 (six) hours as needed for nausea. For nausea  120 tablet  5  . rifaximin (XIFAXAN) 550 MG TABS Take 550 mg by mouth 2 (two) times daily.      [name] tolterodine (DETROL) 2 MG tablet Take 2 mg by mouth daily.      . traZODone (DESYREL) 100 MG tablet Take 2 tablets  (200 mg total) by mouth at bedtime.  60 tablet  11   No current facility-administered medications for this visit.    SURGICAL HISTORY:  Past Surgical History  Procedure Laterality Date  . Appendectomy    . Cholecystectomy    . Colostomy    . Pneumonectomy    . Cervix removed    . Bowel resection    . Esophagogastroduodenoscopy  03/19/2011    Procedure: ESOPHAGOGASTRODUODENOSCOPY (EGD);  Surgeon: Louis Meckel, MD;  Location: [facility];  Service: Endoscopy;  Laterality: N/A;  . Colonoscopy  03/19/2011    Procedure: COLONOSCOPY;  Surgeon: Louis Meckel, MD;  Location: [facility];  Service: Endoscopy;  Laterality: N/A;  . Givens capsule study  03/20/2011    Procedure: GIVENS CAPSULE STUDY;  Surgeon: Louis Meckel, MD;  Location: [facility];  Service: Endoscopy;  Laterality: N/A;  . Small bowel obstruction repair  March 2012  . Umbilical hernia repair  March 2012    REVIEW OF SYSTEMS:  A comprehensive review of systems was negative.   PHYSICAL EXAMINATION: General appearance: alert, cooperative and no distress Head: Normocephalic, without obvious abnormality, atraumatic Neck: no adenopathy Lymph nodes: Cervical, supraclavicular, and axillary nodes normal. Resp: clear to auscultation bilaterally Cardio: regular rate and rhythm, S1, S2 normal, no murmur, click, rub or gallop GI: soft, non-tender; bowel sounds normal; no masses,  no organomegaly Extremities: extremities normal, atraumatic, no cyanosis or edema  ECOG PERFORMANCE STATUS: 1 - Symptomatic but completely ambulatory  Blood pressure 149/72, pulse 96, temperature 97 F (36.1 C), temperature source Oral, resp. rate 19, height 5' (1.524 m), weight 183 lb 14.4 oz (83.416 kg).  LABORATORY DATA: Lab Results  Component Value Date   WBC 3.1* 05/12/2012   HGB 11.1* 05/12/2012   HCT 32.6* 05/12/2012   MCV 91.4 05/12/2012   PLT 46.0 Repeated and verified X2.* 05/12/2012      Chemistry      Component Value Date/Time    NA 137 05/12/2012 0947   NA 139 03/23/2012 0938   K 4.1 05/12/2012 0947   K 4.6 03/23/2012 0938   CL 102 05/12/2012 0947   CL 103 03/23/2012 0938   CO2 22 05/12/2012 0947   CO2 25 03/23/2012 0938   BUN 17 05/12/2012 0947   BUN 14.3 03/23/2012 0938   CREATININE 1.0 05/12/2012 0947   CREATININE 1.2* 03/23/2012 0938      Component Value Date/Time   CALCIUM 9.8 05/12/2012 0947  CALCIUM 9.8 03/23/2012 0938   ALKPHOS 57 05/12/2012 0947   ALKPHOS 71 03/23/2012 0938   AST 37 05/12/2012 0947   AST 40* 03/23/2012 0938   ALT 34 05/12/2012 0947   ALT 46 03/23/2012 0938   BILITOT 0.7 05/12/2012 0947   BILITOT 0.53 03/23/2012 0938       RADIOGRAPHIC STUDIES: No results found.  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 The patient is doing fine today and currently asymptomatic.   PLAN: I discussed the lab result with the patient today. I recommended for her to continue on observation with repeat CBC and LDH in 6 months. The patient will continue to follow up with her primary care physician as scheduled. She was advised to call me immediately if she has any concerning symptoms in the interval especially any bleeding issues, bruises or ecchymosis. She was advised to call me immediately if she has any concerning symptoms in the interval.  All questions were answered. The patient knows to call the clinic with any problems, questions or concerns. We can certainly see the patient much sooner if necessary.

## 2012-05-23 NOTE — Patient Instructions (Signed)
Continue on observation with repeat CBC and LDH in 6 months.  Followup visit at that time

## 2012-05-27 ENCOUNTER — Encounter: Payer: Self-pay | Admitting: Pulmonary Disease

## 2012-06-19 ENCOUNTER — Telehealth: Payer: Self-pay | Admitting: Family Medicine

## 2012-06-19 NOTE — Telephone Encounter (Signed)
[name] need a note stating she was here for labs work  on 05/12/12 faxed to [phone] transportation. Dept.

## 2012-06-19 NOTE — Telephone Encounter (Signed)
I faxed note to below number.  

## 2012-06-23 ENCOUNTER — Ambulatory Visit: Payer: Medicare Other | Admitting: Family Medicine

## 2012-06-25 ENCOUNTER — Ambulatory Visit: Payer: Medicare Other | Admitting: Family Medicine

## 2012-06-29 ENCOUNTER — Encounter: Payer: Self-pay | Admitting: Family Medicine

## 2012-06-29 ENCOUNTER — Ambulatory Visit (INDEPENDENT_AMBULATORY_CARE_PROVIDER_SITE_OTHER): Payer: Medicare Other | Admitting: Family Medicine

## 2012-06-29 VITALS — BP 132/70 | HR 102 | Temp 98.4°F | Wt 183.0 lb

## 2012-06-29 DIAGNOSIS — F329 Major depressive disorder, single episode, unspecified: Secondary | ICD-10-CM

## 2012-06-29 DIAGNOSIS — I10 Essential (primary) hypertension: Secondary | ICD-10-CM

## 2012-06-29 DIAGNOSIS — E119 Type 2 diabetes mellitus without complications: Secondary | ICD-10-CM

## 2012-06-29 DIAGNOSIS — D638 Anemia in other chronic diseases classified elsewhere: Secondary | ICD-10-CM

## 2012-06-29 DIAGNOSIS — J449 Chronic obstructive pulmonary disease, unspecified: Secondary | ICD-10-CM

## 2012-06-29 DIAGNOSIS — D696 Thrombocytopenia, unspecified: Secondary | ICD-10-CM

## 2012-06-29 LAB — CBC WITH DIFFERENTIAL/PLATELET
Basophils Relative: 0.6 % (ref 0.0–3.0)
Eosinophils Relative: 4.3 % (ref 0.0–5.0)
HCT: 34 % — ABNORMAL LOW (ref 36.0–46.0)
Hemoglobin: 11.6 g/dL — ABNORMAL LOW (ref 12.0–15.0)
Lymphs Abs: 0.6 10*3/uL — ABNORMAL LOW (ref 0.7–4.0)
MCV: 87.3 fl (ref 78.0–100.0)
Monocytes Absolute: 0.4 10*3/uL (ref 0.1–1.0)
Monocytes Relative: 8.6 % (ref 3.0–12.0)
Neutro Abs: 3 10*3/uL (ref 1.4–7.7)
Platelets: 61 10*3/uL — ABNORMAL LOW (ref 150.0–400.0)
RBC: 3.89 Mil/uL (ref 3.87–5.11)
WBC: 4.3 10*3/uL — ABNORMAL LOW (ref 4.5–10.5)

## 2012-06-29 LAB — HEMOGLOBIN A1C: Hgb A1c MFr Bld: 8.4 % — ABNORMAL HIGH (ref 4.6–6.5)

## 2012-06-29 MED ORDER — RIFAXIMIN 550 MG PO TABS: 550.0000 mg | ORAL_TABLET | Freq: Two times a day (BID) | ORAL | Status: DC

## 2012-06-29 MED ORDER — PROMETHAZINE HCL 25 MG PO TABS: 25.0000 mg | ORAL_TABLET | Freq: Four times a day (QID) | ORAL | Status: DC | PRN

## 2012-06-29 MED ORDER — OXYCODONE HCL 20 MG PO TABS: 20.0000 mg | ORAL_TABLET | ORAL | Status: DC | PRN

## 2012-06-29 NOTE — Progress Notes (Signed)
  Subjective:    Patient ID: [name], female    DOB: 1950-07-25, 62 y.o.   MRN: [id number]  HPI Here for follow up. She feels fairly well. Her glucoses have run high in the 200s and 300s, but she ran out of Amaryl 3 weeks ago. She plans to have this refilled today. She saw Dr. Ivonne Andrew and her cell counts are stable. No more evidence of GI bleeding.    Review of Systems  Constitutional: Negative.   Respiratory: Negative.   Cardiovascular: Negative.   Gastrointestinal: Negative.        Objective:   Physical Exam  Constitutional: She appears well-developed and well-nourished.  Cardiovascular: Normal rate, regular rhythm, normal heart sounds and intact distal pulses.   Pulmonary/Chest: Effort normal and breath sounds normal.          Assessment & Plan:  She seems to be stable. Get labs today.

## 2012-06-30 NOTE — Progress Notes (Signed)
Quick Note:  I spoke with pt ______ 

## 2012-07-20 ENCOUNTER — Telehealth: Payer: Self-pay | Admitting: Family Medicine

## 2012-07-20 NOTE — Telephone Encounter (Signed)
Caller: Virgin/Patient; Phone: [phone]; Reason for Call: Called to get note from Dr Clent Ridges for upcoming dental work, scheduled for 08/03/12 at 1000 for repair of broken tooth.  Dentist won't proced without MD note due to history of low platlets.  Last office visit 06/29/12.  Please mail MD note to her home ASAP so she has it before 08/03/12.

## 2012-07-28 NOTE — Telephone Encounter (Signed)
I wrote a note to say she can have this procedure

## 2012-07-28 NOTE — Telephone Encounter (Signed)
PT is calling to inquire about her request for approval to see the dentist. She stated that her platelets where low and she'll need Dr. Claris Che approve. She needs to call the dentist today. Please assist.

## 2012-07-28 NOTE — Telephone Encounter (Signed)
I spoke with pt and put the note in the mail, per pt request.

## 2012-09-04 ENCOUNTER — Ambulatory Visit: Payer: Medicare Other | Admitting: Family Medicine

## 2012-09-08 ENCOUNTER — Ambulatory Visit: Payer: Medicare Other | Admitting: Pulmonary Disease

## 2012-09-21 ENCOUNTER — Ambulatory Visit (INDEPENDENT_AMBULATORY_CARE_PROVIDER_SITE_OTHER): Payer: Medicare Other | Admitting: Family Medicine

## 2012-09-21 ENCOUNTER — Encounter: Payer: Self-pay | Admitting: Family Medicine

## 2012-09-21 VITALS — BP 130/68 | HR 88 | Temp 98.7°F | Wt 182.0 lb

## 2012-09-21 DIAGNOSIS — E119 Type 2 diabetes mellitus without complications: Secondary | ICD-10-CM

## 2012-09-21 DIAGNOSIS — D649 Anemia, unspecified: Secondary | ICD-10-CM

## 2012-09-21 DIAGNOSIS — D696 Thrombocytopenia, unspecified: Secondary | ICD-10-CM

## 2012-09-21 DIAGNOSIS — K922 Gastrointestinal hemorrhage, unspecified: Secondary | ICD-10-CM

## 2012-09-21 DIAGNOSIS — C3490 Malignant neoplasm of unspecified part of unspecified bronchus or lung: Secondary | ICD-10-CM

## 2012-09-21 DIAGNOSIS — K7460 Unspecified cirrhosis of liver: Secondary | ICD-10-CM

## 2012-09-21 LAB — CBC WITH DIFFERENTIAL/PLATELET
Basophils Absolute: 0 10*3/uL (ref 0.0–0.1)
Eosinophils Relative: 3.9 % (ref 0.0–5.0)
HCT: 28.7 % — ABNORMAL LOW (ref 36.0–46.0)
Hemoglobin: 9.5 g/dL — ABNORMAL LOW (ref 12.0–15.0)
Lymphs Abs: 0.5 10*3/uL — ABNORMAL LOW (ref 0.7–4.0)
MCV: 82.6 fl (ref 78.0–100.0)
Monocytes Absolute: 0.3 10*3/uL (ref 0.1–1.0)
Monocytes Relative: 9.1 % (ref 3.0–12.0)
Neutro Abs: 2.1 10*3/uL (ref 1.4–7.7)
RDW: 15.1 % — ABNORMAL HIGH (ref 11.5–14.6)

## 2012-09-21 MED ORDER — OXYCODONE HCL 20 MG PO TABS: 20.0000 mg | ORAL_TABLET | ORAL | Status: DC | PRN

## 2012-09-21 NOTE — Progress Notes (Signed)
  Subjective:    Patient ID: [name], female    DOB: 12-14-1950, 62 y.o.   MRN: [id number]  HPI Here for follow up and for continued GI bleeding. She has been very tired for several weeks and now she has seen blood in her ostomy bag several times this past week. Sometimes it is dark and sometimes bright red. She has had mild diffuse abdominal cramping. No nausea or fever. Her glucoses have been from 150 to 350.    Review of Systems  Constitutional: Positive for fatigue. Negative for fever.  Respiratory: Negative.   Cardiovascular: Negative.   Gastrointestinal: Positive for abdominal pain and blood in stool. Negative for nausea, vomiting, diarrhea, constipation, anal bleeding and rectal pain.       Objective:   Physical Exam  Constitutional: She appears well-developed and well-nourished. No distress.  Cardiovascular: Normal rate, regular rhythm, normal heart sounds and intact distal pulses.   Pulmonary/Chest: Effort normal and breath sounds normal.  Abdominal: Soft. Bowel sounds are normal. She exhibits no distension and no mass. There is no rebound and no guarding.  Diffusely tender           Assessment & Plan:  She is having more GI bleeding and I suspect she is quite anemic again. She does require occasional transfusions. We will get a CBC and go from there.

## 2012-09-23 ENCOUNTER — Telehealth: Payer: Self-pay | Admitting: Family Medicine

## 2012-09-23 NOTE — Telephone Encounter (Signed)
PT called and stated that she is unable to get in to see Dr. Sofie Hartigan, until next week. She is concerned that Dr. Clent Ridges would like her to be seen sooner. Please assist.

## 2012-09-23 NOTE — Telephone Encounter (Signed)
I think she is okay to see them next week unless something changes

## 2012-09-23 NOTE — Progress Notes (Signed)
Quick Note:  I spoke with pt and also put a copy of lab results in mail. ______

## 2012-09-24 NOTE — Telephone Encounter (Signed)
I spoke with pt  

## 2012-09-29 ENCOUNTER — Telehealth: Payer: Self-pay | Admitting: Medical Oncology

## 2012-09-29 NOTE — Telephone Encounter (Signed)
She has not been feeling well for 2 weeks. She feels very weak and tired and bleeding in colostomy bag with blood clots. She wants to know if she should see Dr Arbutus Ped sooner than October .  Also , she needs to have 2 teeth pulled and Dr Samuella Cota wants Dr Arbutus Ped to clear her for this. Note to Dr Arbutus Ped.

## 2012-09-30 ENCOUNTER — Telehealth: Payer: Self-pay | Admitting: Medical Oncology

## 2012-09-30 DIAGNOSIS — C3490 Malignant neoplasm of unspecified part of unspecified bronchus or lung: Secondary | ICD-10-CM

## 2012-09-30 NOTE — Telephone Encounter (Signed)
ONC tx request sent and pt notified.

## 2012-09-30 NOTE — Telephone Encounter (Signed)
Message copied by Charma Igo on Wed Sep 30, 2012  3:46 PM ------      Message from: Si Gaul      Created: Tue Sep 29, 2012  6:42 PM       Follow up in 1-2 weeks depending on my schedule or next Friday       ----- Message -----         From: Charma Igo, RN         Sent: 09/29/2012   3:10 PM           To: Si Gaul, MD            She has not been feeling well for 2 weeks. She feels very weak and tired and bleeding in colostomy bag with blood clots. She wants to know if she should see Dr Arbutus Ped sooner than October .              Also , she needs to have 2 teeth pulled and Dr Samuella Cota wants Dr Arbutus Ped to clear her for this. . Last CBC 8/25 hgb 9.4 , plts 53k      What do you want me to tell pt?       ------

## 2012-10-01 ENCOUNTER — Telehealth: Payer: Self-pay | Admitting: Internal Medicine

## 2012-10-01 NOTE — Telephone Encounter (Signed)
s.w. pt and advised on 9.11.14 appts...pt ok and aware

## 2012-10-08 ENCOUNTER — Ambulatory Visit (HOSPITAL_BASED_OUTPATIENT_CLINIC_OR_DEPARTMENT_OTHER): Payer: Medicare Other

## 2012-10-08 ENCOUNTER — Ambulatory Visit (HOSPITAL_COMMUNITY)
Admission: RE | Admit: 2012-10-08 | Discharge: 2012-10-08 | Disposition: A | Discharge status: Home / Self Care | Payer: Medicare Other | Source: Ambulatory Visit | Attending: Internal Medicine | Admitting: Internal Medicine

## 2012-10-08 ENCOUNTER — Other Ambulatory Visit (HOSPITAL_BASED_OUTPATIENT_CLINIC_OR_DEPARTMENT_OTHER): Payer: Medicare Other | Admitting: Lab

## 2012-10-08 ENCOUNTER — Encounter: Payer: Self-pay | Admitting: Internal Medicine

## 2012-10-08 ENCOUNTER — Ambulatory Visit (HOSPITAL_BASED_OUTPATIENT_CLINIC_OR_DEPARTMENT_OTHER): Payer: Medicare Other | Admitting: Internal Medicine

## 2012-10-08 ENCOUNTER — Ambulatory Visit: Payer: Medicare Other | Admitting: Lab

## 2012-10-08 ENCOUNTER — Telehealth: Payer: Self-pay | Admitting: Internal Medicine

## 2012-10-08 ENCOUNTER — Other Ambulatory Visit: Payer: Self-pay | Admitting: Medical Oncology

## 2012-10-08 VITALS — BP 142/60 | HR 89 | Temp 97.4°F | Resp 20 | Ht 60.0 in | Wt 181.0 lb

## 2012-10-08 VITALS — BP 119/59 | HR 77 | Temp 97.1°F | Resp 16

## 2012-10-08 DIAGNOSIS — R5381 Other malaise: Secondary | ICD-10-CM

## 2012-10-08 DIAGNOSIS — D649 Anemia, unspecified: Secondary | ICD-10-CM

## 2012-10-08 DIAGNOSIS — C3490 Malignant neoplasm of unspecified part of unspecified bronchus or lung: Secondary | ICD-10-CM

## 2012-10-08 DIAGNOSIS — K7460 Unspecified cirrhosis of liver: Secondary | ICD-10-CM

## 2012-10-08 DIAGNOSIS — K922 Gastrointestinal hemorrhage, unspecified: Secondary | ICD-10-CM

## 2012-10-08 DIAGNOSIS — Z85118 Personal history of other malignant neoplasm of bronchus and lung: Secondary | ICD-10-CM

## 2012-10-08 DIAGNOSIS — D696 Thrombocytopenia, unspecified: Secondary | ICD-10-CM

## 2012-10-08 DIAGNOSIS — D6959 Other secondary thrombocytopenia: Secondary | ICD-10-CM

## 2012-10-08 LAB — PREPARE RBC (CROSSMATCH)

## 2012-10-08 LAB — COMPREHENSIVE METABOLIC PANEL (CC13)
ALT: 49 U/L (ref 0–55)
AST: 41 U/L — ABNORMAL HIGH (ref 5–34)
Alkaline Phosphatase: 65 U/L (ref 40–150)
Creatinine: 1.3 mg/dL — ABNORMAL HIGH (ref 0.6–1.1)
Total Bilirubin: 0.31 mg/dL (ref 0.20–1.20)

## 2012-10-08 LAB — CBC WITH DIFFERENTIAL/PLATELET
BASO%: 1 % (ref 0.0–2.0)
EOS%: 2.8 % (ref 0.0–7.0)
HCT: 27.3 % — ABNORMAL LOW (ref 34.8–46.6)
LYMPH%: 15.1 % (ref 14.0–49.7)
MCH: 26 pg (ref 25.1–34.0)
MCHC: 32.1 g/dL (ref 31.5–36.0)
NEUT%: 69.2 % (ref 38.4–76.8)
Platelets: 49 10*3/uL — ABNORMAL LOW (ref 145–400)
RBC: 3.36 10*6/uL — ABNORMAL LOW (ref 3.70–5.45)

## 2012-10-08 MED ORDER — DIPHENHYDRAMINE HCL 25 MG PO CAPS
ORAL_CAPSULE | ORAL | Status: AC
  Filled 2012-10-08: qty 1

## 2012-10-08 MED ORDER — ACETAMINOPHEN 325 MG PO TABS: 650.0000 mg | ORAL_TABLET | Freq: Once | ORAL | Status: DC

## 2012-10-08 MED ORDER — DIPHENHYDRAMINE HCL 50 MG/ML IJ SOLN: 25.0000 mg | Freq: Once | INTRAMUSCULAR | Status: DC

## 2012-10-08 MED ORDER — SODIUM CHLORIDE 0.9 % IV SOLN
250.0000 mL | Freq: Once | INTRAVENOUS | Status: AC
  Administered 2012-10-08: 250 mL via INTRAVENOUS

## 2012-10-08 MED ORDER — DIPHENHYDRAMINE HCL 25 MG PO CAPS
25.0000 mg | ORAL_CAPSULE | Freq: Once | ORAL | Status: AC
  Administered 2012-10-08: 25 mg via ORAL

## 2012-10-08 NOTE — Progress Notes (Signed)
[facility] [phone]) [phone]   [phone]  OFFICE PROGRESS NOTE  [name], MD [address] [place] [address]  DIAGNOSIS:  1) stage IB non-small cell lung cancer diagnosed in April 2004 status post left upper lobectomy by Dr. Dorris Fetch.  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  3) anemia of chronic disease plus/minus deficiency secondary to GI bleed.   PRIOR THERAPY:none   CURRENT THERAPY: observation.  INTERVAL HISTORY: [name] 62 y.o. female returns to the clinic today for followup visit and evaluation of persistent fatigue and bleeding from the colostomy bag. The patient mentioned that for the last 2-3 weeks she has been feeling much worse with increasing fatigue and bleeding from her colostomy bag. She denied having any other significant complaints and specifically no nausea or vomiting, no fever or chills. She denied having any significant weight loss or night sweats. She has no chest pain but continues to have shortness breath with exertion. She had repeat CBC performed earlier today and she is here for evaluation and discussion of her lab results and recommendation regarding treatment of her condition.   MEDICAL HISTORY: Past Medical History  Diagnosis Date  . Cirrhosis   . Diabetes mellitus   . GERD (gastroesophageal reflux disease)   . Cervical disc syndrome   . Lung cancer     squamous cell  . Chronic respiratory failure   . Diverticulitis of colon   . Perforation of colon   . Arthritis   . Anemia   . IBS (irritable bowel syndrome)   . Chronic lower GI bleeding   . Overactive bladder   . Cervical cancer   . Thrombocytopenia     sees Dr. Arbutus Ped   . Splenomegaly   . Anxiety   . Depression   . Hypertension   . Asthma   . Chronic headache disorder   . Blood transfusion without reported diagnosis   . Heart murmur   . Hyperlipidemia   . Lung cancer   . COPD (chronic  obstructive pulmonary disease)     sees Dr. Shelle Iron     ALLERGIES:  is allergic to codeine phosphate; hydrocodone-acetaminophen; morphine; penicillins; and cephalexin.  MEDICATIONS:  Current Outpatient Prescriptions  Medication Sig Dispense Refill  . albuterol-ipratropium (COMBIVENT) 18-103 MCG/ACT inhaler Inhale 2 puffs into the lungs every 4 (four) hours as needed. For asthma      . BD PEN NEEDLE NANO U/F 32G X 4 MM MISC       . cholecalciferol (VITAMIN D) 1000 UNITS tablet Take 1,000 Units by mouth daily.       . diazepam (VALIUM) 5 MG tablet Take 1 tablet (5 mg total) by mouth 3 (three) times daily. For anxiety  90 tablet  5  . dicyclomine (BENTYL) 10 MG capsule Take 1 capsule (10 mg total) by mouth 3 (three) times daily.  90 capsule  11  . esomeprazole (NEXIUM) 40 MG capsule Take 1 capsule (40 mg total) by mouth daily before breakfast.  30 capsule  11  . exenatide (BYETTA) 5 MCG/0.02ML SOLN Inject 5 mcg into the skin 2 (two) times daily with a meal.      . Fluticasone-Salmeterol (ADVAIR) 250-50 MCG/DOSE AEPB Inhale 1 puff into the lungs every 12 (twelve) hours.  60 each  11  . folic acid (FOLVITE) 1 MG tablet Take 1 mg by mouth daily.      . furosemide (LASIX) 20 MG tablet Take 20 mg by  mouth 2 (two) times daily as needed.      [name] glimepiride (AMARYL) 4 MG tablet Take 1 tablet (4 mg total) by mouth daily.  30 tablet  11  . metFORMIN (GLUCOPHAGE) 500 MG tablet Take 1,000 mg by mouth 2 (two) times daily with a meal.      . metoCLOPramide (REGLAN) 10 MG tablet Take 1 tablet (10 mg total) by mouth 3 (three) times daily.  90 tablet  11  . Oxycodone HCl 20 MG TABS Take 1 tablet (20 mg total) by mouth every 3 (three) hours as needed (pain).  240 tablet  0  . OXYGEN-HELIUM IN Inhale 2 L/min into the lungs. Use at bedtime or anytime laying flat      . potassium chloride (MICRO-K) 10 MEQ CR capsule Take 1 capsule by mouth daily.      . promethazine (PHENERGAN) 25 MG tablet Take 1 tablet (25 mg  total) by mouth every 6 (six) hours as needed for nausea. For nausea  120 tablet  5  . rifaximin (XIFAXAN) 550 MG TABS Take 1 tablet (550 mg total) by mouth 2 (two) times daily.  60 tablet  11  . tolterodine (DETROL) 2 MG tablet Take 2 mg by mouth daily.      . traZODone (DESYREL) 100 MG tablet Take 2 tablets (200 mg total) by mouth at bedtime.  60 tablet  11   No current facility-administered medications for this visit.    SURGICAL HISTORY:  Past Surgical History  Procedure Laterality Date  . Appendectomy    . Cholecystectomy    . Colostomy    . Pneumonectomy    . Cervix removed    . Bowel resection    . Esophagogastroduodenoscopy  03/19/2011    Procedure: ESOPHAGOGASTRODUODENOSCOPY (EGD);  Surgeon: Louis Meckel, MD;  Location: [facility];  Service: Endoscopy;  Laterality: N/A;  . Colonoscopy  03/19/2011    Procedure: COLONOSCOPY;  Surgeon: Louis Meckel, MD;  Location: [facility];  Service: Endoscopy;  Laterality: N/A;  . Givens capsule study  03/20/2011    Procedure: GIVENS CAPSULE STUDY;  Surgeon: Louis Meckel, MD;  Location: [facility];  Service: Endoscopy;  Laterality: N/A;  . Small bowel obstruction repair  March 2012  . Umbilical hernia repair  March 2012    REVIEW OF SYSTEMS:  Constitutional: positive for fatigue Eyes: negative Ears, nose, mouth, throat, and face: negative Respiratory: positive for dyspnea on exertion Cardiovascular: negative Gastrointestinal: positive for Bleeding from the ostomy site. Genitourinary:negative Integument/breast: negative Hematologic/lymphatic: Anemia and low platelets Musculoskeletal:negative Neurological: negative Behavioral/Psych: negative Allergic/Immunologic: negative   PHYSICAL EXAMINATION: General appearance: alert, cooperative, fatigued and no distress Head: Normocephalic, without obvious abnormality, atraumatic Neck: no adenopathy and no JVD Lymph nodes: Cervical, supraclavicular, and axillary nodes  normal. Resp: clear to auscultation bilaterally Cardio: regular rate and rhythm, S1, S2 normal, no murmur, click, rub or gallop GI: soft, non-tender; bowel sounds normal; no masses,  no organomegaly Extremities: extremities normal, atraumatic, no cyanosis or edema Neurologic: Alert and oriented X 3, normal strength and tone. Normal symmetric reflexes. Normal coordination and gait  ECOG PERFORMANCE STATUS: 1 - Symptomatic but completely ambulatory  Blood pressure 142/60, pulse 89, temperature 97.4 F (36.3 C), temperature source Oral, resp. rate 20, height 5' (1.524 m), weight 181 lb (82.101 kg).  LABORATORY DATA: Lab Results  Component Value Date   WBC 3.4* 10/08/2012   HGB 8.8* 10/08/2012   HCT 27.3* 10/08/2012   MCV 81.2 10/08/2012  PLT 49* 10/08/2012      Chemistry      Component Value Date/Time   NA 138 10/08/2012 0757   NA 137 05/12/2012 0947   K 5.0 10/08/2012 0757   K 4.1 05/12/2012 0947   CL 102 05/12/2012 0947   CL 103 03/23/2012 0938   CO2 23 10/08/2012 0757   CO2 22 05/12/2012 0947   BUN 21.9 10/08/2012 0757   BUN 17 05/12/2012 0947   CREATININE 1.3* 10/08/2012 0757   CREATININE 1.0 05/12/2012 0947      Component Value Date/Time   CALCIUM 9.9 10/08/2012 0757   CALCIUM 9.8 05/12/2012 0947   ALKPHOS 65 10/08/2012 0757   ALKPHOS 57 05/12/2012 0947   AST 41* 10/08/2012 0757   AST 37 05/12/2012 0947   ALT 49 10/08/2012 0757   ALT 34 05/12/2012 0947   BILITOT 0.31 10/08/2012 0757   BILITOT 0.7 05/12/2012 0947       RADIOGRAPHIC STUDIES: No results found.  ASSESSMENT AND PLAN:  1) persistent anemia questionable for anemia of chronic disease plus/minus deficiency: The patient is currently asymptomatic and her hemoglobin is down to 8.8 g/dL. I will arrange for the patient received 1 unit of PRBCs transfusion today. I will check her iron study and if it is low I would consider the patient for Feraheme infusion. 2) thrombocytopenia: Secondary to liver cirrhosis. Currently stable. We  will continue to monitor for now. 3) history of stage IB non-small cell lung cancer diagnosed in 2014. Continue on observation. 4) followup visit in 2 months with repeat CBC and iron study.  The patient voices understanding of current disease status and treatment options and is in agreement with the current care plan.  All questions were answered. The patient knows to call the clinic with any problems, questions or concerns. We can certainly see the patient much sooner if necessary.  I spent 15 minutes counseling the patient face to face. The total time spent in the appointment was 25 minutes.

## 2012-10-08 NOTE — Patient Instructions (Signed)
Blood Transfusion  A blood transfusion replaces your blood or some of its parts. Blood is replaced when you have lost blood because of surgery, an accident, or for severe blood conditions like anemia. You can donate blood to be used on yourself if you have a planned surgery. If you lose blood during that surgery, your own blood can be given back to you. Any blood given to you is checked to make sure it matches your blood type. Your temperature, blood pressure, and heart rate (vital signs) will be checked often.  GET HELP RIGHT AWAY IF:   You feel sick to your stomach (nauseous) or throw up (vomit).  You have watery poop (diarrhea).  You have shortness of breath or trouble breathing.  You have blood in your pee (urine) or have dark colored pee.  You have chest pain or tightness.  Your eyes or skin turn yellow (jaundice).  You have a temperature by mouth above 102 F (38.9 C), not controlled by medicine.  You start to shake and have chills.  You develop a a red rash (hives) or feel itchy.  You develop lightheadedness or feel confused.  You develop back, joint, or muscle pain.  You do not feel hungry (lost appetite).  You feel tired, restless, or nervous.  You develop belly (abdominal) cramps. Document Released: 04/12/2008 Document Revised: 04/08/2011 Document Reviewed: 04/12/2008 Exit[employer] Patient Information 2014 [employer].  

## 2012-10-08 NOTE — Telephone Encounter (Signed)
gv adnprinted appt sched and avs for pt for Nov...sent pt to lab

## 2012-10-08 NOTE — Progress Notes (Signed)
Har done 

## 2012-10-09 LAB — TYPE AND SCREEN
Antibody Screen: NEGATIVE
Unit division: 0

## 2012-10-14 ENCOUNTER — Telehealth: Payer: Self-pay | Admitting: *Deleted

## 2012-10-14 ENCOUNTER — Telehealth: Payer: Self-pay | Admitting: Medical Oncology

## 2012-10-14 NOTE — Telephone Encounter (Signed)
Per staff message from scheduler I have moved appts from Fridays to Tuesday.

## 2012-10-14 NOTE — Telephone Encounter (Signed)
Cannot make lab appt Friday r/s to tuesday

## 2012-10-14 NOTE — Telephone Encounter (Signed)
Per staff message and POF I have scheduled appts.  JMW  

## 2012-10-15 ENCOUNTER — Telehealth: Payer: Self-pay | Admitting: Internal Medicine

## 2012-10-16 ENCOUNTER — Ambulatory Visit: Payer: Medicare Other

## 2012-10-16 ENCOUNTER — Other Ambulatory Visit: Payer: Medicare Other | Admitting: Lab

## 2012-10-20 ENCOUNTER — Ambulatory Visit (HOSPITAL_BASED_OUTPATIENT_CLINIC_OR_DEPARTMENT_OTHER): Payer: Medicare Other

## 2012-10-20 ENCOUNTER — Other Ambulatory Visit (HOSPITAL_BASED_OUTPATIENT_CLINIC_OR_DEPARTMENT_OTHER): Payer: Medicare Other | Admitting: Lab

## 2012-10-20 ENCOUNTER — Other Ambulatory Visit: Payer: Self-pay | Admitting: Medical Oncology

## 2012-10-20 VITALS — BP 150/54 | HR 79 | Temp 98.0°F

## 2012-10-20 DIAGNOSIS — D649 Anemia, unspecified: Secondary | ICD-10-CM

## 2012-10-20 DIAGNOSIS — C3490 Malignant neoplasm of unspecified part of unspecified bronchus or lung: Secondary | ICD-10-CM

## 2012-10-20 DIAGNOSIS — D539 Nutritional anemia, unspecified: Secondary | ICD-10-CM

## 2012-10-20 DIAGNOSIS — K922 Gastrointestinal hemorrhage, unspecified: Secondary | ICD-10-CM

## 2012-10-20 LAB — CBC WITH DIFFERENTIAL/PLATELET
BASO%: 1.1 % (ref 0.0–2.0)
Eosinophils Absolute: 0.1 10*3/uL (ref 0.0–0.5)
HCT: 29.4 % — ABNORMAL LOW (ref 34.8–46.6)
MCHC: 32.3 g/dL (ref 31.5–36.0)
MONO#: 0.4 10*3/uL (ref 0.1–0.9)
NEUT#: 3.5 10*3/uL (ref 1.5–6.5)
Platelets: 54 10*3/uL — ABNORMAL LOW (ref 145–400)
RBC: 3.65 10*6/uL — ABNORMAL LOW (ref 3.70–5.45)
WBC: 4.7 10*3/uL (ref 3.9–10.3)
lymph#: 0.6 10*3/uL — ABNORMAL LOW (ref 0.9–3.3)

## 2012-10-20 LAB — IRON AND TIBC CHCC
%SAT: 7 % — ABNORMAL LOW (ref 21–57)
TIBC: 474 ug/dL — ABNORMAL HIGH (ref 236–444)
UIBC: 438 ug/dL — ABNORMAL HIGH (ref 120–384)

## 2012-10-20 LAB — FERRITIN CHCC: Ferritin: 25 ng/ml (ref 9–269)

## 2012-10-20 MED ORDER — SODIUM CHLORIDE 0.9 % IV SOLN
Freq: Once | INTRAVENOUS | Status: AC
  Administered 2012-10-20: 15:00:00 via INTRAVENOUS

## 2012-10-20 MED ORDER — FERUMOXYTOL INJECTION 510 MG/17 ML
510.0000 mg | Freq: Once | INTRAVENOUS | Status: AC
  Administered 2012-10-20: 510 mg via INTRAVENOUS
  Filled 2012-10-20: qty 17

## 2012-10-20 NOTE — Patient Instructions (Addendum)
Ferumoxytol injection What is this medicine? FERUMOXYTOL is an iron complex. Iron is used to make healthy red blood cells, which carry oxygen and nutrients throughout the body. This medicine is used to treat iron deficiency anemia in people with chronic kidney disease. This medicine may be used for other purposes; ask your health care provider or pharmacist if you have questions. What should I tell my health care provider before I take this medicine? They need to know if you have any of these conditions: -anemia not caused by low iron levels -high levels of iron in the blood -magnetic resonance imaging (MRI) test scheduled -an unusual or allergic reaction to iron, other medicines, foods, dyes, or preservatives -pregnant or trying to get pregnant -breast-feeding How should I use this medicine? This medicine is for infusion into a vein. It is given by a health care professional in a hospital or clinic setting. Talk to your pediatrician regarding the use of this medicine in children. Special care may be needed. Overdosage: If you think you've taken too much of this medicine contact a poison control center or emergency room at once. Overdosage: If you think you have taken too much of this medicine contact a poison control center or emergency room at once. NOTE: This medicine is only for you. Do not share this medicine with others. What if I miss a dose? It is important not to miss your dose. Call your doctor or health care professional if you are unable to keep an appointment. What may interact with this medicine? This medicine may interact with the following medications: -other iron products This list may not describe all possible interactions. Give your health care provider a list of all the medicines, herbs, non-prescription drugs, or dietary supplements you use. Also tell them if you smoke, drink alcohol, or use illegal drugs. Some items may interact with your medicine. What should I watch  for while using this medicine? Visit your doctor or healthcare professional regularly. Tell your doctor or healthcare professional if your symptoms do not start to get better or if they get worse. You may need blood work done while you are taking this medicine. You may need to follow a special diet. Talk to your doctor. Foods that contain iron include: whole grains/cereals, dried fruits, beans, or peas, leafy green vegetables, and organ meats (liver, kidney). What side effects may I notice from receiving this medicine? Side effects that you should report to your doctor or health care professional as soon as possible: -allergic reactions like skin rash, itching or hives, swelling of the face, lips, or tongue -breathing problems -changes in blood pressure -feeling faint or lightheaded, falls -fever or chills -flushing, sweating, or hot feelings -swelling of the ankles or feet Side effects that usually do not require medical attention (Report these to your doctor or health care professional if they continue or are bothersome.): -diarrhea -headache -nausea, vomiting -stomach pain This list may not describe all possible side effects. Call your doctor for medical advice about side effects. You may report side effects to FDA at 1-800-FDA-1088. Where should I keep my medicine? This drug is given in a hospital or clinic and will not be stored at home. NOTE: This sheet is a summary. It may not cover all possible information. If you have questions about this medicine, talk to your doctor, pharmacist, or health care provider.  2012, Elsevier/Gold Standard. (10/07/2007 9:48:25 PM) 

## 2012-10-20 NOTE — Progress Notes (Signed)
Patient c/o feeling light headed 20 minutes after receiving iron infusion. BP 151/55. Patient states that she has been having light headed episodes on and off since Sunday. Hgb 9.5.  Spoke with Dr. Arbutus Ped and patient instructed to drink plenty of fluids and stay hydrated.

## 2012-10-23 ENCOUNTER — Ambulatory Visit: Payer: Medicare Other

## 2012-10-27 ENCOUNTER — Ambulatory Visit (HOSPITAL_BASED_OUTPATIENT_CLINIC_OR_DEPARTMENT_OTHER): Payer: Medicare Other

## 2012-10-27 VITALS — BP 142/66 | HR 92 | Temp 97.6°F | Resp 20

## 2012-10-27 DIAGNOSIS — D539 Nutritional anemia, unspecified: Secondary | ICD-10-CM

## 2012-10-27 DIAGNOSIS — K922 Gastrointestinal hemorrhage, unspecified: Secondary | ICD-10-CM

## 2012-10-27 DIAGNOSIS — D649 Anemia, unspecified: Secondary | ICD-10-CM

## 2012-10-27 MED ORDER — SODIUM CHLORIDE 0.9 % IV SOLN
Freq: Once | INTRAVENOUS | Status: AC
  Administered 2012-10-27: 20 mL via INTRAVENOUS

## 2012-10-27 MED ORDER — FERUMOXYTOL INJECTION 510 MG/17 ML
510.0000 mg | Freq: Once | INTRAVENOUS | Status: AC
  Administered 2012-10-27: 510 mg via INTRAVENOUS
  Filled 2012-10-27: qty 17

## 2012-11-02 ENCOUNTER — Encounter: Payer: Self-pay | Admitting: Family Medicine

## 2012-11-02 ENCOUNTER — Ambulatory Visit (INDEPENDENT_AMBULATORY_CARE_PROVIDER_SITE_OTHER): Payer: Medicare Other | Admitting: Family Medicine

## 2012-11-02 VITALS — BP 124/60 | HR 90 | Temp 98.3°F | Wt 179.0 lb

## 2012-11-02 DIAGNOSIS — I10 Essential (primary) hypertension: Secondary | ICD-10-CM

## 2012-11-02 DIAGNOSIS — D539 Nutritional anemia, unspecified: Secondary | ICD-10-CM

## 2012-11-02 DIAGNOSIS — J4489 Other specified chronic obstructive pulmonary disease: Secondary | ICD-10-CM

## 2012-11-02 DIAGNOSIS — K7460 Unspecified cirrhosis of liver: Secondary | ICD-10-CM

## 2012-11-02 DIAGNOSIS — Z23 Encounter for immunization: Secondary | ICD-10-CM

## 2012-11-02 DIAGNOSIS — F3289 Other specified depressive episodes: Secondary | ICD-10-CM

## 2012-11-02 DIAGNOSIS — J449 Chronic obstructive pulmonary disease, unspecified: Secondary | ICD-10-CM

## 2012-11-02 DIAGNOSIS — E119 Type 2 diabetes mellitus without complications: Secondary | ICD-10-CM

## 2012-11-02 DIAGNOSIS — F329 Major depressive disorder, single episode, unspecified: Secondary | ICD-10-CM

## 2012-11-02 LAB — BASIC METABOLIC PANEL
BUN: 19 mg/dL (ref 6–23)
Calcium: 9.4 mg/dL (ref 8.4–10.5)
Creatinine, Ser: 1.3 mg/dL — ABNORMAL HIGH (ref 0.4–1.2)
GFR: 46.34 mL/min — ABNORMAL LOW (ref >60.00)

## 2012-11-02 LAB — POCT URINALYSIS DIPSTICK
Bilirubin, UA: NEGATIVE
Ketones, UA: NEGATIVE
Protein, UA: NEGATIVE

## 2012-11-02 LAB — HEPATIC FUNCTION PANEL
ALT: 53 U/L — ABNORMAL HIGH (ref 0–35)
Bilirubin, Direct: 0.2 mg/dL (ref 0.0–0.3)
Total Bilirubin: 0.7 mg/dL (ref 0.3–1.2)

## 2012-11-02 LAB — TSH: TSH: 1 u[IU]/mL (ref 0.35–5.50)

## 2012-11-02 LAB — HEMOGLOBIN A1C: Hgb A1c MFr Bld: 8.7 % — ABNORMAL HIGH (ref 4.6–6.5)

## 2012-11-02 MED ORDER — INSULIN GLARGINE 100 UNIT/ML SOLOSTAR PEN: 30.0000 [IU] | PEN_INJECTOR | Freq: Every day | SUBCUTANEOUS | Status: DC

## 2012-11-02 MED ORDER — INSULIN ASPART 100 UNIT/ML FLEXPEN: 10.0000 [IU] | PEN_INJECTOR | Freq: Two times a day (BID) | SUBCUTANEOUS | Status: DC

## 2012-11-02 NOTE — Addendum Note (Signed)
Addended by: Aniceto Boss A on: 11/02/2012 12:13 PM   Modules accepted: Orders

## 2012-11-02 NOTE — Progress Notes (Signed)
  Subjective:    Patient ID: [name], female    DOB: December 07, 1950, 62 y.o.   MRN: [id number]  HPI Here to follow up. She has been seeing Dr. Arbutus Ped for her anemia, and she has received one unit PRBC and 2 infusions of Feraheme in the past month. Her Hgb has risen from 8.8 to 9.5. However she had been feeling quite poorly, she is tired and weak and has trouble walking even short distances. No SOB or chest pain. Her BP is stable but her glucoses have been very high lately. Her glucoses have been in the 200s and 300s for the past few weeks. Her last A1c on 06-30-12 was 8.4.    Review of Systems  Constitutional: Positive for fatigue.  Respiratory: Negative.   Cardiovascular: Negative.   Neurological: Positive for weakness and light-headedness. Negative for dizziness.       Objective:   Physical Exam  Constitutional: She is oriented to person, place, and time. She appears well-developed and well-nourished.  Walks with a cane   Cardiovascular: Normal rate, regular rhythm, normal heart sounds and intact distal pulses.   Pulmonary/Chest: Effort normal and breath sounds normal.  Musculoskeletal:  Trace edema to both ankles   Neurological: She is alert and oriented to person, place, and time.          Assessment & Plan:  Her weakness is multifactorial and her anemia has been addressed by Dr. Arbutus Ped. I suspect her poorly controlled diabetes has a lot to do with this, so we will start her on insulin. Use Novolog for mealtimes and Lantus at night. Stay on Metformin but stop the Byetta and Glimepiride. Get labs today.

## 2012-11-03 ENCOUNTER — Telehealth: Payer: Self-pay | Admitting: Family Medicine

## 2012-11-03 ENCOUNTER — Other Ambulatory Visit: Payer: Self-pay | Admitting: Family Medicine

## 2012-11-03 NOTE — Telephone Encounter (Signed)
Pt following up on request for this med. Pt states she needs something asap, her sugar is 495. Pt also request refill of diazepam (VALIUM) 5 MG tablet  1 / 3 x a day Pharm: [facility] [address]

## 2012-11-03 NOTE — Telephone Encounter (Signed)
Patient's insurance prefers Humalog 50/50 or Humalog 75/25. Can patient switch from Novolog? [facility] - [place]

## 2012-11-04 MED ORDER — INSULIN LISPRO PROT & LISPRO (75-25 MIX) 100 UNIT/ML KWIKPEN: 10.0000 [IU] | PEN_INJECTOR | Freq: Two times a day (BID) | SUBCUTANEOUS | Status: DC

## 2012-11-04 MED ORDER — DIAZEPAM 5 MG PO TABS: 5.0000 mg | ORAL_TABLET | Freq: Three times a day (TID) | ORAL | Status: DC

## 2012-11-04 NOTE — Telephone Encounter (Signed)
I sent scripts to pharmacy and spoke with pt. 

## 2012-11-04 NOTE — Telephone Encounter (Signed)
Call in Valium #90 with 5 rf. Also switch from Novolog to Humalog 75/25 with the same dosing

## 2012-11-04 NOTE — Progress Notes (Signed)
Quick Note:  I spoke with pt ______ 

## 2012-11-04 NOTE — Telephone Encounter (Signed)
Call in #90 with 5 rf 

## 2012-11-05 NOTE — Telephone Encounter (Signed)
This script was called in on 11/04/12

## 2012-11-05 NOTE — Addendum Note (Signed)
Addended by: Aniceto Boss A on: 11/05/2012 10:32 AM   Modules accepted: Orders

## 2012-11-06 ENCOUNTER — Other Ambulatory Visit: Payer: Self-pay

## 2012-11-06 ENCOUNTER — Encounter (HOSPITAL_COMMUNITY): Payer: Self-pay | Admitting: Emergency Medicine

## 2012-11-06 ENCOUNTER — Emergency Department (HOSPITAL_COMMUNITY)
Admission: EM | Admit: 2012-11-06 | Discharge: 2012-11-06 | Disposition: A | Discharge status: Home / Self Care | Payer: Medicare Other | Attending: Emergency Medicine | Admitting: Emergency Medicine

## 2012-11-06 ENCOUNTER — Emergency Department (HOSPITAL_COMMUNITY): Payer: Medicare Other

## 2012-11-06 DIAGNOSIS — E119 Type 2 diabetes mellitus without complications: Secondary | ICD-10-CM | POA: Insufficient documentation

## 2012-11-06 DIAGNOSIS — K589 Irritable bowel syndrome without diarrhea: Secondary | ICD-10-CM | POA: Insufficient documentation

## 2012-11-06 DIAGNOSIS — F329 Major depressive disorder, single episode, unspecified: Secondary | ICD-10-CM | POA: Insufficient documentation

## 2012-11-06 DIAGNOSIS — R739 Hyperglycemia, unspecified: Secondary | ICD-10-CM

## 2012-11-06 DIAGNOSIS — Z8541 Personal history of malignant neoplasm of cervix uteri: Secondary | ICD-10-CM | POA: Insufficient documentation

## 2012-11-06 DIAGNOSIS — Z87891 Personal history of nicotine dependence: Secondary | ICD-10-CM | POA: Insufficient documentation

## 2012-11-06 DIAGNOSIS — Z79899 Other long term (current) drug therapy: Secondary | ICD-10-CM | POA: Insufficient documentation

## 2012-11-06 DIAGNOSIS — I10 Essential (primary) hypertension: Secondary | ICD-10-CM | POA: Insufficient documentation

## 2012-11-06 DIAGNOSIS — Z792 Long term (current) use of antibiotics: Secondary | ICD-10-CM | POA: Insufficient documentation

## 2012-11-06 DIAGNOSIS — Z8739 Personal history of other diseases of the musculoskeletal system and connective tissue: Secondary | ICD-10-CM | POA: Insufficient documentation

## 2012-11-06 DIAGNOSIS — R011 Cardiac murmur, unspecified: Secondary | ICD-10-CM | POA: Insufficient documentation

## 2012-11-06 DIAGNOSIS — J4489 Other specified chronic obstructive pulmonary disease: Secondary | ICD-10-CM | POA: Insufficient documentation

## 2012-11-06 DIAGNOSIS — Z9889 Other specified postprocedural states: Secondary | ICD-10-CM | POA: Insufficient documentation

## 2012-11-06 DIAGNOSIS — Z9089 Acquired absence of other organs: Secondary | ICD-10-CM | POA: Insufficient documentation

## 2012-11-06 DIAGNOSIS — Z862 Personal history of diseases of the blood and blood-forming organs and certain disorders involving the immune mechanism: Secondary | ICD-10-CM | POA: Insufficient documentation

## 2012-11-06 DIAGNOSIS — G8929 Other chronic pain: Secondary | ICD-10-CM | POA: Insufficient documentation

## 2012-11-06 DIAGNOSIS — R109 Unspecified abdominal pain: Secondary | ICD-10-CM | POA: Insufficient documentation

## 2012-11-06 DIAGNOSIS — J449 Chronic obstructive pulmonary disease, unspecified: Secondary | ICD-10-CM | POA: Insufficient documentation

## 2012-11-06 DIAGNOSIS — Z85118 Personal history of other malignant neoplasm of bronchus and lung: Secondary | ICD-10-CM | POA: Insufficient documentation

## 2012-11-06 DIAGNOSIS — F3289 Other specified depressive episodes: Secondary | ICD-10-CM | POA: Insufficient documentation

## 2012-11-06 DIAGNOSIS — Z794 Long term (current) use of insulin: Secondary | ICD-10-CM | POA: Insufficient documentation

## 2012-11-06 DIAGNOSIS — K219 Gastro-esophageal reflux disease without esophagitis: Secondary | ICD-10-CM | POA: Insufficient documentation

## 2012-11-06 DIAGNOSIS — K922 Gastrointestinal hemorrhage, unspecified: Secondary | ICD-10-CM | POA: Insufficient documentation

## 2012-11-06 DIAGNOSIS — Z880 Allergy status to penicillin: Secondary | ICD-10-CM | POA: Insufficient documentation

## 2012-11-06 DIAGNOSIS — F411 Generalized anxiety disorder: Secondary | ICD-10-CM | POA: Insufficient documentation

## 2012-11-06 DIAGNOSIS — E669 Obesity, unspecified: Secondary | ICD-10-CM | POA: Insufficient documentation

## 2012-11-06 LAB — URINALYSIS, ROUTINE W REFLEX MICROSCOPIC
Bilirubin Urine: NEGATIVE
Hgb urine dipstick: NEGATIVE
Specific Gravity, Urine: 1.022 (ref 1.005–1.030)
pH: 5.5 (ref 5.0–8.0)

## 2012-11-06 LAB — COMPREHENSIVE METABOLIC PANEL
AST: 40 U/L — ABNORMAL HIGH (ref 0–37)
Albumin: 3.7 g/dL (ref 3.5–5.2)
Alkaline Phosphatase: 80 U/L (ref 39–117)
BUN: 21 mg/dL (ref 6–23)
Potassium: 4.5 mEq/L (ref 3.5–5.1)
Total Protein: 7.2 g/dL (ref 6.0–8.3)

## 2012-11-06 LAB — CBC WITH DIFFERENTIAL/PLATELET
Eosinophils Relative: 2 % (ref 0–5)
HCT: 32.1 % — ABNORMAL LOW (ref 36.0–46.0)
Lymphs Abs: 0.5 10*3/uL — ABNORMAL LOW (ref 0.7–4.0)
MCV: 86.5 fL (ref 78.0–100.0)
Monocytes Relative: 11 % (ref 3–12)
Neutro Abs: 2.1 10*3/uL (ref 1.7–7.7)
RBC: 3.71 MIL/uL — ABNORMAL LOW (ref 3.87–5.11)
RDW: 20.4 % — ABNORMAL HIGH (ref 11.5–15.5)
WBC: 3 10*3/uL — ABNORMAL LOW (ref 4.0–10.5)

## 2012-11-06 LAB — URINE MICROSCOPIC-ADD ON

## 2012-11-06 LAB — GLUCOSE, CAPILLARY: Glucose-Capillary: 298 mg/dL — ABNORMAL HIGH (ref 70–99)

## 2012-11-06 MED ORDER — ONDANSETRON HCL 4 MG/2ML IJ SOLN
4.0000 mg | Freq: Once | INTRAMUSCULAR | Status: AC
  Administered 2012-11-06: 4 mg via INTRAVENOUS
  Filled 2012-11-06: qty 2

## 2012-11-06 MED ORDER — HYDROMORPHONE HCL PF 1 MG/ML IJ SOLN
1.0000 mg | Freq: Once | INTRAMUSCULAR | Status: AC
  Administered 2012-11-06: 1 mg via INTRAVENOUS
  Filled 2012-11-06: qty 1

## 2012-11-06 MED ORDER — IOHEXOL 300 MG/ML  SOLN
50.0000 mL | Freq: Once | INTRAMUSCULAR | Status: AC | PRN
  Administered 2012-11-06: 50 mL via ORAL

## 2012-11-06 MED ORDER — SODIUM CHLORIDE 0.9 % IV BOLUS (SEPSIS)
1000.0000 mL | Freq: Once | INTRAVENOUS | Status: AC
  Administered 2012-11-06: 1000 mL via INTRAVENOUS

## 2012-11-06 MED ORDER — IOHEXOL 300 MG/ML  SOLN
100.0000 mL | Freq: Once | INTRAMUSCULAR | Status: AC | PRN
  Administered 2012-11-06: 100 mL via INTRAVENOUS

## 2012-11-06 MED ORDER — SODIUM CHLORIDE 0.9 % IV SOLN
INTRAVENOUS | Status: DC
  Administered 2012-11-06 (×2): via INTRAVENOUS

## 2012-11-06 NOTE — ED Notes (Signed)
Patient with high blood sugar today of 539 per EMS.  Patient c/o of being thirsty and was started recently on Humulog.  Patient takes Percocet for chronic abdominal pain.  Patient alert and oriented.  Rufused IV start by EMS.

## 2012-11-06 NOTE — ED Notes (Signed)
Patient has colostomy bag on left abdomen.  Contents are a pinkish brown color.  Patient reports she has chronic bleeding into colostomy and has received transfusions and is anemic.  Cause she reports is unknown.

## 2012-11-06 NOTE — ED Provider Notes (Signed)
CSN: [id number]     Arrival date & time 11/06/12  1422 History   First MD Initiated Contact with Patient 11/06/12 1506     Chief Complaint  Patient presents with  . Hyperglycemia   (Consider location/radiation/quality/duration/timing/severity/associated sxs/prior Treatment) HPI Comments: [name] is a 62 y.o. Female presents complaining of high blood sugar and abdominal pain. She's also had ongoing, intermittent gastrointestinal bleeding, for 2 years. She recently started taking Humalog. She has been using Percocet for her chronic abdominal pain, and it has not helped. She states that she has cirrhosis, but does not see a GI doctor currently. She is being managed by her PCP. She denies cough, chest pain, weakness, or dizziness. She has mild, shortness of breath recently. There's been no vomiting, fever, weakness, or dizziness. She is taking her usual medications, without relief. There are no other known modifying factors.   Patient is a 62 y.o. female presenting with hyperglycemia. The history is provided by the patient.  Hyperglycemia   Past Medical History  Diagnosis Date  . Cirrhosis   . Diabetes mellitus   . GERD (gastroesophageal reflux disease)   . Cervical disc syndrome   . Lung cancer     squamous cell  . Chronic respiratory failure   . Diverticulitis of colon   . Perforation of colon   . Arthritis   . IBS (irritable bowel syndrome)   . Chronic lower GI bleeding   . Overactive bladder   . Cervical cancer   . Thrombocytopenia     sees Dr. Arbutus Ped   . Splenomegaly   . Anxiety   . Depression   . Hypertension   . Asthma   . Chronic headache disorder   . Blood transfusion without reported diagnosis   . Heart murmur   . Hyperlipidemia   . Lung cancer   . COPD (chronic obstructive pulmonary disease)     sees Dr. Shelle Iron   . Anemia     sees Dr. Arbutus Ped, due to chronic disease and GI losses    Past Surgical History  Procedure Laterality Date  . Appendectomy     . Cholecystectomy    . Colostomy    . Pneumonectomy    . Cervix removed    . Bowel resection    . Esophagogastroduodenoscopy  03/19/2011    Procedure: ESOPHAGOGASTRODUODENOSCOPY (EGD);  Surgeon: Louis Meckel, MD;  Location: [facility];  Service: Endoscopy;  Laterality: N/A;  . Colonoscopy  03/19/2011    Procedure: COLONOSCOPY;  Surgeon: Louis Meckel, MD;  Location: [facility];  Service: Endoscopy;  Laterality: N/A;  . Givens capsule study  03/20/2011    Procedure: GIVENS CAPSULE STUDY;  Surgeon: Louis Meckel, MD;  Location: [facility];  Service: Endoscopy;  Laterality: N/A;  . Small bowel obstruction repair  March 2012  . Umbilical hernia repair  March 2012   Family History  Problem Relation Age of Onset  . Coronary artery disease    . Diabetes type II    . Anesthesia problems Neg Hx   . Hypotension Neg Hx   . Malignant hyperthermia Neg Hx   . Pseudochol deficiency Neg Hx    History  Substance Use Topics  . Smoking status: Former Smoker -- 2.00 packs/day for 35 years    Types: Cigarettes    Quit date: 03/24/2002  . Smokeless tobacco: Never Used  . Alcohol Use: No   OB History   Grav Para Term Preterm Abortions TAB SAB Ect Mult Living  Review of Systems  All other systems reviewed and are negative.    Allergies  Codeine phosphate; Hydrocodone-acetaminophen; Morphine; Penicillins; and Cephalexin  Home Medications   Current Outpatient Rx  Name  Route  Sig  Dispense  Refill  . albuterol-ipratropium (COMBIVENT) 18-103 MCG/ACT inhaler   Inhalation   Inhale 2 puffs into the lungs every 4 (four) hours as needed for wheezing or shortness of breath. For asthma          . BD PEN NEEDLE NANO U/F 32G X 4 MM MISC               . cholecalciferol (VITAMIN D) 1000 UNITS tablet   Oral   Take 1,000 Units by mouth daily.          . diazepam (VALIUM) 5 MG tablet   Oral   Take 1 tablet (5 mg total) by mouth 3 (three) times daily. For  anxiety   90 tablet   5   . dicyclomine (BENTYL) 10 MG capsule   Oral   Take 1 capsule (10 mg total) by mouth 3 (three) times daily.   90 capsule   11   . esomeprazole (NEXIUM) 40 MG capsule   Oral   Take 1 capsule (40 mg total) by mouth daily before breakfast.   30 capsule   11   . Fluticasone-Salmeterol (ADVAIR) 250-50 MCG/DOSE AEPB   Inhalation   Inhale 1 puff into the lungs every 12 (twelve) hours.   60 each   11   . folic acid (FOLVITE) 1 MG tablet   Oral   Take 1 mg by mouth daily.         . furosemide (LASIX) 20 MG tablet   Oral   Take 20 mg by mouth 2 (two) times daily as needed.         . Insulin Glargine 100 UNIT/ML SOPN   Subcutaneous   Inject 30 Units into the skin at bedtime.         . Insulin Lispro Prot & Lispro (HUMALOG MIX 75/25 KWIKPEN) (75-25) 100 UNIT/ML SUPN   Subcutaneous   Inject 10 Units into the skin 2 (two) times daily with a meal.   45 mL   3     Diagnosis code is 250.00   . metFORMIN (GLUCOPHAGE) 500 MG tablet   Oral   Take 1,000 mg by mouth 2 (two) times daily with a meal.         . metoCLOPramide (REGLAN) 10 MG tablet   Oral   Take 10 mg by mouth 3 (three) times daily.         . Oxycodone HCl 20 MG TABS   Oral   Take 20 mg by mouth every 3 (three) hours as needed (pain).         . OXYGEN-HELIUM IN   Inhalation   Inhale 2 L/min into the lungs. Use at bedtime or anytime laying flat         . potassium chloride (MICRO-K) 10 MEQ CR capsule   Oral   Take 1 capsule by mouth daily.         . promethazine (PHENERGAN) 25 MG tablet   Oral   Take 25 mg by mouth every 6 (six) hours as needed for nausea. For nausea         . rifaximin (XIFAXAN) 550 MG TABS tablet   Oral   Take 550 mg by mouth 2 (two) times daily.         [name]  tolterodine (DETROL) 2 MG tablet   Oral   Take 2 mg by mouth daily.         . traZODone (DESYREL) 100 MG tablet   Oral   Take 2 tablets (200 mg total) by mouth at bedtime.   60  tablet   11    BP 158/60  Pulse 72  Temp(Src) 98.5 F (36.9 C) (Oral)  Resp 18  Wt 179 lb (81.194 kg)  BMI 34.96 kg/m2  SpO2 100% Physical Exam  Nursing note and vitals reviewed. Constitutional: She is oriented to person, place, and time. She appears well-developed.  obese  HENT:  Head: Normocephalic and atraumatic.  Eyes: Conjunctivae and EOM are normal. Pupils are equal, round, and reactive to light.  Neck: Normal range of motion and phonation normal. Neck supple.  Cardiovascular: Normal rate, regular rhythm and intact distal pulses.   Pulmonary/Chest: Effort normal and breath sounds normal. She exhibits no tenderness.  Abdominal: Soft. Bowel sounds are normal. She exhibits no distension and no mass. There is tenderness (bilateral upper quadrants, mild). There is no rebound and no guarding.  I spent left mid-abdomen is draining, stool that appears maroon in color.  Musculoskeletal: Normal range of motion. She exhibits no edema and no tenderness.  Neurological: She is alert and oriented to person, place, and time. She exhibits normal muscle tone.  Skin: Skin is warm and dry.  Psychiatric: She has a normal mood and affect. Her behavior is normal. Judgment and thought content normal.    ED Course  Procedures (including critical care time) Labs Review Labs Reviewed  COMPREHENSIVE METABOLIC PANEL - Abnormal; Notable for the following:    Sodium 132 (*)    Glucose, Bld 498 (*)    Creatinine, Ser 1.14 (*)    AST 40 (*)    ALT 47 (*)    GFR calc non Af Amer 51 (*)    GFR calc Af Amer 59 (*)    All other components within normal limits  CBC WITH DIFFERENTIAL - Abnormal; Notable for the following:    WBC 3.0 (*)    RBC 3.71 (*)    Hemoglobin 10.4 (*)    HCT 32.1 (*)    RDW 20.4 (*)    Platelets 48 (*)    Lymphs Abs 0.5 (*)    All other components within normal limits  URINALYSIS, ROUTINE W REFLEX MICROSCOPIC - Abnormal; Notable for the following:    Glucose, UA >1000 (*)     Leukocytes, UA SMALL (*)    All other components within normal limits  GLUCOSE, CAPILLARY - Abnormal; Notable for the following:    Glucose-Capillary 480 (*)    All other components within normal limits  LIPASE, BLOOD - Abnormal; Notable for the following:    Lipase 100 (*)    All other components within normal limits  GLUCOSE, CAPILLARY - Abnormal; Notable for the following:    Glucose-Capillary 298 (*)    All other components within normal limits  GLUCOSE, CAPILLARY - Abnormal; Notable for the following:    Glucose-Capillary 244 (*)    All other components within normal limits  URINE CULTURE  URINE MICROSCOPIC-ADD ON   Imaging Review Ct Abdomen Pelvis W Contrast  11/06/2012   CLINICAL DATA:  Abdominal pain. 8/10 upper abdominal pain.  EXAM: CT ABDOMEN AND PELVIS WITH CONTRAST  TECHNIQUE: Multidetector CT imaging of the abdomen and pelvis was performed using the standard protocol following bolus administration of intravenous contrast.  CONTRAST:   OMNIPAQUE IOHEXOL 300 MG/ML  SOLN  COMPARISON:  03/17/2011.  FINDINGS: Lung Bases: Dependent atelectasis.  Liver: Nodular contour of the liver compatible with hepatic cirrhosis. No focal mass lesions.  Spleen: Chronic massive splenomegaly with 21 cm splenic span. This is slightly larger than on the prior exam of 03/17/2011.  Gallbladder:  Surgically absent. Clips in the fossa.  Common bile duct:  Normal.  Pancreas:  Normal.  Adrenal glands:  Normal bilaterally.  Kidneys: Nonspecific bilateral perinephric stranding. Normal enhancement and delayed excretion of contrast. Both ureters appear within normal limits.  Stomach:  Normal. No inflammatory changes.  Small bowel: Probable omentectomy with small bowel apposed to the anterior abdominal wall. There is no obstruction. No dilated loops of small bowel. There is no mesenteric adenopathy. No free air.  Colon: Colonic fluid levels are present which are abnormal but nonspecific. There is no mural  thickening. These fluid levels are commonly associated with enteric infection. Descending colostomy with small Hartmann's pouch. Parastomal hernia is present without complicating features.  Pelvic Genitourinary: Urinary bladder appears normal aside from cystocele. Hysterectomy. No free fluid in the pelvis.  Bones:  No aggressive osseous lesions.  Vasculature: Atherosclerosis without an acute vascular abnormality.  Body Wall: Scarring in the anterior abdominal wall. Laxity of the anterior abdominal wall. Tiny right paramedian subxiphoid fat containing ventral hernia.  IMPRESSION: 1. Colonic air-fluid levels most commonly associated with enteric infection but nonspecific. 2. Splenomegaly with 21 cm splenic span. Mild increase from prior. 3. Cholecystectomy and hysterectomy with cystocele. 4. Hepatic cirrhosis. 5. Descending colostomy with uncomplicated parastomal hernia.   Electronically Signed   By: Andreas Newport M.D.   On: 11/06/2012 16:36   Dg Chest Portable 1 View  11/06/2012   CLINICAL DATA:  Hyperglycemia.  EXAM: PORTABLE CHEST - 1 VIEW  COMPARISON:  Multiple priors  FINDINGS: The cardiomediastinal silhouette is unchanged. There are low lung volumes. Mild left basilar opacity. No edema. No pleural effusion or pneumothorax. No acute osseous abnormality.  IMPRESSION: Mild left basilar opacity, likely atelectasis. Low lung volumes.   Electronically Signed   By: Jerene Dilling M.D.   On: 11/06/2012 15:32      Date: 11/06/12  Rate: 83  Rhythm: normal sinus rhythm  QRS Axis: normal  PR and QT Intervals: normal  ST/T Wave abnormalities: normal  PR and QRS Conduction Disutrbances:none  Narrative Interpretation:   Old EKG Reviewed: changes noted- rate slower since 02/05/05   MDM   1. Abdominal pain   2. Hyperglycemia      Nonspecific abdominal pain, with hyperglycemia. Glucose, improved with IV fluids. No evidence for DKA or metabolic instability. Hemoglobin is improved from baseline. No  evidence for bowel obstruction, or colitis. She is stable for discharge with outpatient management.   Nursing Notes Reviewed/ Care Coordinated Applicable Imaging Reviewed Interpretation of Laboratory Data incorporated into ED treatment  Plan: Home Medications- usual; Home Treatments- rest, increase fluids; return here if the recommended treatment, does not improve the symptoms; Recommended follow up- PCP prn    Flint Melter, MD 11/06/12 2036

## 2012-11-06 NOTE — ED Notes (Addendum)
Patient reports she did not refuse IV start by EMS.  Patient having upper abdominal pain that she rates as an 8.  She had 20mg  Oxycodone po at 0900.  Takes this as needed for chronic abdominal pain.  Patient also reporting increased shortness of breath with history of copd and oxygen use at home.  She took 10 units Humulog today at 1230.  She started that medication yesterday.  She also took metformin today.  She also takes 30 units Lantus at bedtime.  She also had that last night.

## 2012-11-06 NOTE — ED Notes (Signed)
Bed: WA20 Expected date:  Expected time:  Means of arrival:  Comments: ems 

## 2012-11-07 ENCOUNTER — Emergency Department (HOSPITAL_COMMUNITY)
Admission: EM | Admit: 2012-11-07 | Discharge: 2012-11-07 | Disposition: A | Discharge status: Home / Self Care | Payer: Medicare Other | Attending: Emergency Medicine | Admitting: Emergency Medicine

## 2012-11-07 ENCOUNTER — Telehealth: Payer: Self-pay | Admitting: Family Medicine

## 2012-11-07 ENCOUNTER — Encounter (HOSPITAL_COMMUNITY): Payer: Self-pay | Admitting: Emergency Medicine

## 2012-11-07 DIAGNOSIS — Z85118 Personal history of other malignant neoplasm of bronchus and lung: Secondary | ICD-10-CM | POA: Insufficient documentation

## 2012-11-07 DIAGNOSIS — F3289 Other specified depressive episodes: Secondary | ICD-10-CM | POA: Insufficient documentation

## 2012-11-07 DIAGNOSIS — K219 Gastro-esophageal reflux disease without esophagitis: Secondary | ICD-10-CM | POA: Insufficient documentation

## 2012-11-07 DIAGNOSIS — R011 Cardiac murmur, unspecified: Secondary | ICD-10-CM | POA: Insufficient documentation

## 2012-11-07 DIAGNOSIS — R609 Edema, unspecified: Secondary | ICD-10-CM | POA: Insufficient documentation

## 2012-11-07 DIAGNOSIS — Z794 Long term (current) use of insulin: Secondary | ICD-10-CM | POA: Insufficient documentation

## 2012-11-07 DIAGNOSIS — D649 Anemia, unspecified: Secondary | ICD-10-CM | POA: Insufficient documentation

## 2012-11-07 DIAGNOSIS — E785 Hyperlipidemia, unspecified: Secondary | ICD-10-CM | POA: Insufficient documentation

## 2012-11-07 DIAGNOSIS — J449 Chronic obstructive pulmonary disease, unspecified: Secondary | ICD-10-CM | POA: Insufficient documentation

## 2012-11-07 DIAGNOSIS — E119 Type 2 diabetes mellitus without complications: Secondary | ICD-10-CM | POA: Insufficient documentation

## 2012-11-07 DIAGNOSIS — J4489 Other specified chronic obstructive pulmonary disease: Secondary | ICD-10-CM | POA: Insufficient documentation

## 2012-11-07 DIAGNOSIS — R739 Hyperglycemia, unspecified: Secondary | ICD-10-CM

## 2012-11-07 DIAGNOSIS — D696 Thrombocytopenia, unspecified: Secondary | ICD-10-CM | POA: Insufficient documentation

## 2012-11-07 DIAGNOSIS — F411 Generalized anxiety disorder: Secondary | ICD-10-CM | POA: Insufficient documentation

## 2012-11-07 DIAGNOSIS — I10 Essential (primary) hypertension: Secondary | ICD-10-CM | POA: Insufficient documentation

## 2012-11-07 DIAGNOSIS — Z880 Allergy status to penicillin: Secondary | ICD-10-CM | POA: Insufficient documentation

## 2012-11-07 DIAGNOSIS — Z87891 Personal history of nicotine dependence: Secondary | ICD-10-CM | POA: Insufficient documentation

## 2012-11-07 DIAGNOSIS — Z8541 Personal history of malignant neoplasm of cervix uteri: Secondary | ICD-10-CM | POA: Insufficient documentation

## 2012-11-07 DIAGNOSIS — IMO0002 Reserved for concepts with insufficient information to code with codable children: Secondary | ICD-10-CM | POA: Insufficient documentation

## 2012-11-07 DIAGNOSIS — Z79899 Other long term (current) drug therapy: Secondary | ICD-10-CM | POA: Insufficient documentation

## 2012-11-07 DIAGNOSIS — Z792 Long term (current) use of antibiotics: Secondary | ICD-10-CM | POA: Insufficient documentation

## 2012-11-07 DIAGNOSIS — F329 Major depressive disorder, single episode, unspecified: Secondary | ICD-10-CM | POA: Insufficient documentation

## 2012-11-07 DIAGNOSIS — Z9089 Acquired absence of other organs: Secondary | ICD-10-CM | POA: Insufficient documentation

## 2012-11-07 DIAGNOSIS — R109 Unspecified abdominal pain: Secondary | ICD-10-CM | POA: Insufficient documentation

## 2012-11-07 DIAGNOSIS — M129 Arthropathy, unspecified: Secondary | ICD-10-CM | POA: Insufficient documentation

## 2012-11-07 LAB — GLUCOSE, CAPILLARY: Glucose-Capillary: 374 mg/dL — ABNORMAL HIGH (ref 70–99)

## 2012-11-07 LAB — URINE CULTURE
Colony Count: NO GROWTH
Culture: NO GROWTH

## 2012-11-07 LAB — POCT I-STAT, CHEM 8
BUN: 18 mg/dL (ref 6–23)
Calcium, Ion: 1.26 mmol/L (ref 1.13–1.30)
Creatinine, Ser: 1.3 mg/dL — ABNORMAL HIGH (ref 0.50–1.10)
HCT: 38 % (ref 36.0–46.0)
Potassium: 4.5 mEq/L (ref 3.5–5.1)
Sodium: 138 mEq/L (ref 135–145)
TCO2: 23 mmol/L (ref 0–100)

## 2012-11-07 LAB — BASIC METABOLIC PANEL
CO2: 24 mEq/L (ref 19–32)
Chloride: 101 mEq/L (ref 96–112)
Creatinine, Ser: 0.81 mg/dL (ref 0.50–1.10)
GFR calc non Af Amer: 77 mL/min — ABNORMAL LOW (ref >90)
Glucose, Bld: 142 mg/dL — ABNORMAL HIGH (ref 70–99)
Potassium: 3.7 mEq/L (ref 3.5–5.1)
Sodium: 134 mEq/L — ABNORMAL LOW (ref 135–145)

## 2012-11-07 MED ORDER — INSULIN GLARGINE 100 UNIT/ML SOLOSTAR PEN: 35.0000 [IU] | PEN_INJECTOR | Freq: Every day | SUBCUTANEOUS | Status: DC

## 2012-11-07 MED ORDER — OXYCODONE HCL 5 MG PO TABS
20.0000 mg | ORAL_TABLET | Freq: Once | ORAL | Status: AC
  Administered 2012-11-07: 20 mg via ORAL
  Filled 2012-11-07: qty 4

## 2012-11-07 MED ORDER — INSULIN ASPART 100 UNIT/ML ~~LOC~~ SOLN
10.0000 [IU] | Freq: Once | SUBCUTANEOUS | Status: AC
  Administered 2012-11-07: 10 [IU] via SUBCUTANEOUS
  Filled 2012-11-07: qty 1

## 2012-11-07 NOTE — ED Notes (Signed)
Bed: WA17 Expected date:  Expected time:  Means of arrival:  Comments: EMS/hyperglycemia 

## 2012-11-07 NOTE — ED Notes (Addendum)
Per EMS pt was seen here yesterday for same complaint of hyperglycemia; was told to follow up with PCP and has not received call back from them. Pt called EMS d/t blood sugar check of 499. Pt wears 2 lpm of O2 [place] at home for comfort.

## 2012-11-07 NOTE — Telephone Encounter (Signed)
Patient's blood sugars are running high even after taking insulin. Patient is requesting to change her dosage. Please advise.

## 2012-11-07 NOTE — ED Notes (Signed)
CBG done, 461

## 2012-11-07 NOTE — ED Notes (Addendum)
Pt reports stage 4 cirrhosis with constant tenderness to right side of abd. Pt has colostomy at present time. Pt reports when seen yesterday, they did CT and blood work.

## 2012-11-07 NOTE — ED Provider Notes (Signed)
CSN: [id number]     Arrival date & time 11/07/12  1205 History   First MD Initiated Contact with Patient 11/07/12 1214     Chief Complaint  Patient presents with  . Hyperglycemia   (Consider location/radiation/quality/duration/timing/severity/associated sxs/prior Treatment) HPI  This is a 62 year old female with an extensive past medical history including diabetes who presents with hyperglycemia. The patient was seen and evaluated yesterday for the same. Yesterday she was also having abdominal pain and had a full abnormal evaluation including a CT scan. The patient states that she got home last night and her blood sugar was around 250. She took 30 units of Lantus last night which is her normal dosing. She also took 10 units of Humalog this morning with her breakfast. She states that she had a small amount of cereal for breakfast. She retook her blood sugar this morning and it was 499. She called EMS because she's been unable to get up with her primary care doctor. EMS blood sugar was 585. Patient reports baseline abdominal pain today with no worsening. She denies any chest pain or shortness of breath. She is at her baseline 2 L nasal cannula.  Review of patient's chart reveals that she was just started on insulin this past week. She has taken insulin in the past but has recently had blood sugars ranging from 200-300 daily.  Past Medical History  Diagnosis Date  . Cirrhosis   . Diabetes mellitus   . GERD (gastroesophageal reflux disease)   . Cervical disc syndrome   . Lung cancer     squamous cell  . Chronic respiratory failure   . Diverticulitis of colon   . Perforation of colon   . Arthritis   . IBS (irritable bowel syndrome)   . Chronic lower GI bleeding   . Overactive bladder   . Cervical cancer   . Thrombocytopenia     sees Dr. Arbutus Ped   . Splenomegaly   . Anxiety   . Depression   . Hypertension   . Asthma   . Chronic headache disorder   . Blood transfusion without reported  diagnosis   . Heart murmur   . Hyperlipidemia   . Lung cancer   . COPD (chronic obstructive pulmonary disease)     sees Dr. Shelle Iron   . Anemia     sees Dr. Arbutus Ped, due to chronic disease and GI losses    Past Surgical History  Procedure Laterality Date  . Appendectomy    . Cholecystectomy    . Colostomy    . Pneumonectomy    . Cervix removed    . Bowel resection    . Esophagogastroduodenoscopy  03/19/2011    Procedure: ESOPHAGOGASTRODUODENOSCOPY (EGD);  Surgeon: Louis Meckel, MD;  Location: [facility];  Service: Endoscopy;  Laterality: N/A;  . Colonoscopy  03/19/2011    Procedure: COLONOSCOPY;  Surgeon: Louis Meckel, MD;  Location: [facility];  Service: Endoscopy;  Laterality: N/A;  . Givens capsule study  03/20/2011    Procedure: GIVENS CAPSULE STUDY;  Surgeon: Louis Meckel, MD;  Location: [facility];  Service: Endoscopy;  Laterality: N/A;  . Small bowel obstruction repair  March 2012  . Umbilical hernia repair  March 2012   Family History  Problem Relation Age of Onset  . Coronary artery disease    . Diabetes type II    . Anesthesia problems Neg Hx   . Hypotension Neg Hx   . Malignant hyperthermia Neg Hx   . Pseudochol deficiency  Neg Hx    History  Substance Use Topics  . Smoking status: Former Smoker -- 2.00 packs/day for 35 years    Types: Cigarettes    Quit date: 03/24/2002  . Smokeless tobacco: Never Used  . Alcohol Use: No   OB History   Grav Para Term Preterm Abortions TAB SAB Ect Mult Living                 Review of Systems  Constitutional: Negative for fever.  Respiratory: Negative for cough, chest tightness and shortness of breath.   Cardiovascular: Negative for chest pain.  Gastrointestinal: Positive for abdominal pain. Negative for nausea and vomiting.  Endocrine: Negative for polydipsia and polyuria.  Genitourinary: Negative for dysuria.  Skin: Negative for wound.  Neurological: Negative for headaches.  Psychiatric/Behavioral:  Negative for confusion.  All other systems reviewed and are negative.    Allergies  Codeine phosphate; Hydrocodone-acetaminophen; Morphine; Penicillins; and Cephalexin  Home Medications   Current Outpatient Rx  Name  Route  Sig  Dispense  Refill  . albuterol-ipratropium (COMBIVENT) 18-103 MCG/ACT inhaler   Inhalation   Inhale 2 puffs into the lungs every 4 (four) hours as needed for wheezing or shortness of breath. For asthma          . cholecalciferol (VITAMIN D) 1000 UNITS tablet   Oral   Take 1,000 Units by mouth daily.          . diazepam (VALIUM) 5 MG tablet   Oral   Take 1 tablet (5 mg total) by mouth 3 (three) times daily. For anxiety   90 tablet   5   . dicyclomine (BENTYL) 10 MG capsule   Oral   Take 1 capsule (10 mg total) by mouth 3 (three) times daily.   90 capsule   11   . esomeprazole (NEXIUM) 40 MG capsule   Oral   Take 40 mg by mouth every evening.         . Fluticasone-Salmeterol (ADVAIR) 250-50 MCG/DOSE AEPB   Inhalation   Inhale 1 puff into the lungs every 12 (twelve) hours.   60 each   11   . folic acid (FOLVITE) 1 MG tablet   Oral   Take 1 mg by mouth every evening.          . furosemide (LASIX) 20 MG tablet   Oral   Take 20 mg by mouth daily.          . Insulin Lispro Prot & Lispro (HUMALOG MIX 75/25 KWIKPEN) (75-25) 100 UNIT/ML SUPN   Subcutaneous   Inject 10 Units into the skin 2 (two) times daily with a meal.   45 mL   3     Diagnosis code is 250.00   . metFORMIN (GLUCOPHAGE) 500 MG tablet   Oral   Take 1,000 mg by mouth 2 (two) times daily with a meal.         . metoCLOPramide (REGLAN) 10 MG tablet   Oral   Take 10 mg by mouth 3 (three) times daily.         . Oxycodone HCl 20 MG TABS   Oral   Take 20 mg by mouth every 3 (three) hours as needed (pain).         . potassium chloride (MICRO-K) 10 MEQ CR capsule   Oral   Take 1 capsule by mouth daily.         . promethazine (PHENERGAN) 25 MG tablet    Oral  Take 25 mg by mouth every 6 (six) hours as needed for nausea. For nausea         . rifaximin (XIFAXAN) 550 MG TABS tablet   Oral   Take 550 mg by mouth 2 (two) times daily.         [name] tolterodine (DETROL) 2 MG tablet   Oral   Take 2 mg by mouth daily.         . traZODone (DESYREL) 100 MG tablet   Oral   Take 2 tablets (200 mg total) by mouth at bedtime.   60 tablet   11   . Insulin Glargine 100 UNIT/ML SOPN   Subcutaneous   Inject 35 Units into the skin at bedtime.   1 pen   3    BP 136/61  Pulse 76  Temp(Src) 98.5 F (36.9 C) (Oral)  Resp 18  SpO2 100% Physical Exam  Nursing note and vitals reviewed. Constitutional: She is oriented to person, place, and time.  Elderly, nontoxic  HENT:  Head: Normocephalic and atraumatic.  Mouth/Throat: Oropharynx is clear and moist.  Eyes: Pupils are equal, round, and reactive to light.  Neck: Neck supple.  Cardiovascular: Normal rate, regular rhythm and normal heart sounds.   No murmur heard. Pulmonary/Chest: Effort normal and breath sounds normal. No respiratory distress. She has no wheezes.  Abdominal: Soft. Bowel sounds are normal.  Diffuse tenderness to palpation without rebound or guarding, colostomy present with loose stool  Musculoskeletal:  Trace bilateral lower extremity edema  Neurological: She is alert and oriented to person, place, and time.  Skin: Skin is warm and dry.  Psychiatric: She has a normal mood and affect.    ED Course  Procedures (including critical care time) Labs Review Labs Reviewed  GLUCOSE, CAPILLARY - Abnormal; Notable for the following:    Glucose-Capillary 461 (*)    All other components within normal limits  BASIC METABOLIC PANEL - Abnormal; Notable for the following:    Sodium 134 (*)    Glucose, Bld 142 (*)    GFR calc non Af Amer 77 (*)    GFR calc Af Amer 90 (*)    All other components within normal limits  GLUCOSE, CAPILLARY - Abnormal; Notable for the following:     Glucose-Capillary 374 (*)    All other components within normal limits  POCT I-STAT, CHEM 8 - Abnormal; Notable for the following:    Creatinine, Ser 1.30 (*)    Glucose, Bld 333 (*)    All other components within normal limits   Imaging Review Ct Abdomen Pelvis W Contrast  11/06/2012   CLINICAL DATA:  Abdominal pain. 8/10 upper abdominal pain.  EXAM: CT ABDOMEN AND PELVIS WITH CONTRAST  TECHNIQUE: Multidetector CT imaging of the abdomen and pelvis was performed using the standard protocol following bolus administration of intravenous contrast.  CONTRAST:  OMNIPAQUE IOHEXOL 300 MG/ML  SOLN  COMPARISON:  03/17/2011.  FINDINGS: Lung Bases: Dependent atelectasis.  Liver: Nodular contour of the liver compatible with hepatic cirrhosis. No focal mass lesions.  Spleen: Chronic massive splenomegaly with 21 cm splenic span. This is slightly larger than on the prior exam of 03/17/2011.  Gallbladder:  Surgically absent. Clips in the fossa.  Common bile duct:  Normal.  Pancreas:  Normal.  Adrenal glands:  Normal bilaterally.  Kidneys: Nonspecific bilateral perinephric stranding. Normal enhancement and delayed excretion of contrast. Both ureters appear within normal limits.  Stomach:  Normal. No inflammatory changes.  Small bowel: Probable  omentectomy with small bowel apposed to the anterior abdominal wall. There is no obstruction. No dilated loops of small bowel. There is no mesenteric adenopathy. No free air.  Colon: Colonic fluid levels are present which are abnormal but nonspecific. There is no mural thickening. These fluid levels are commonly associated with enteric infection. Descending colostomy with small Hartmann's pouch. Parastomal hernia is present without complicating features.  Pelvic Genitourinary: Urinary bladder appears normal aside from cystocele. Hysterectomy. No free fluid in the pelvis.  Bones:  No aggressive osseous lesions.  Vasculature: Atherosclerosis without an acute vascular abnormality.   Body Wall: Scarring in the anterior abdominal wall. Laxity of the anterior abdominal wall. Tiny right paramedian subxiphoid fat containing ventral hernia.  IMPRESSION: 1. Colonic air-fluid levels most commonly associated with enteric infection but nonspecific. 2. Splenomegaly with 21 cm splenic span. Mild increase from prior. 3. Cholecystectomy and hysterectomy with cystocele. 4. Hepatic cirrhosis. 5. Descending colostomy with uncomplicated parastomal hernia.   Electronically Signed   By: Andreas Newport M.D.   On: 11/06/2012 16:36   Dg Chest Portable 1 View  11/06/2012   CLINICAL DATA:  Hyperglycemia.  EXAM: PORTABLE CHEST - 1 VIEW  COMPARISON:  Multiple priors  FINDINGS: The cardiomediastinal silhouette is unchanged. There are low lung volumes. Mild left basilar opacity. No edema. No pleural effusion or pneumothorax. No acute osseous abnormality.  IMPRESSION: Mild left basilar opacity, likely atelectasis. Low lung volumes.   Electronically Signed   By: Jerene Dilling M.D.   On: 11/06/2012 15:32    EKG Interpretation   None      EKG independently reviewed by myself: Sinus rhythm with a rate of 83, no evidence of acute ST elevation or ischemia, no significant change from prior  MDM   1. Hyperglycemia    This is a 62 year old female who presents with hyperglycemia. She is nontoxic-appearing on exam and her vital signs are within normal limits. She does not have any somatic complaints today except for chronic abdominal pain. She had a full abdominal work up yesterday.  Patient was just started on Lantus and Humalog this week by her primary care physician for increased blood sugars at home ranging from 200-300.  Patient's initial blood glucose here was 461.  Patient was given a normal saline bolus and 10 units of insulin. A BMP was obtained. Initial BMP was within normal limits including BG of 142 when CBG was still reading greater than 300.  I suspect this is a lab error as patient had not  received her insulin. Repeat chem 8 was obtained. Patient's blood sugar was noted to be 333 but without a gap and no evidence of DKA.  Patient's creatinine is at her baseline. Patient likely needs adjustment in her insulin for better glycemic control.  Patient had a full workup yesterday to rule out infection and repeat EKG shows no evidence of ischemia. At this time I'm not sure why she is having hyperglycemic episodes but suspect that she needs adjustment given that she was recently started on insulin.  I instructed the patient to increase her Lantus dose to 35U tonight. She will keep the same Humalog dosing. She is to followup with her primary care physician. She is to monitor and log her glucoses closely.  After history, exam, and medical workup I feel the patient has been appropriately medically screened and is safe for discharge home. Pertinent diagnoses were discussed with the patient. Patient was given return precautions.    Shon Baton, MD 11/08/12  0739 

## 2012-11-07 NOTE — Telephone Encounter (Signed)
Increase Lantus to 40 units at night and increase Humalog to 20 units with breakfast and 25 units with supper. Call me back next week with an update

## 2012-11-07 NOTE — Telephone Encounter (Signed)
Called pt back with md response. She stated she is currently in route going to [place] [facility] by EMS. They check her BS it was @ 585...lmb

## 2012-11-09 ENCOUNTER — Telehealth: Payer: Self-pay | Admitting: Family Medicine

## 2012-11-09 NOTE — Telephone Encounter (Signed)
Pt called and stated that she was seen in the ED this weekend, with elevated sugars of over 500. She states that they did not alter her medication in any way, and her sugar is 331 this morning. Please advise on scheduling.

## 2012-11-09 NOTE — Telephone Encounter (Signed)
We instructed her last Saturday  to increase the doses of both insulins. Ask her if she has done this over the weekend

## 2012-11-09 NOTE — Telephone Encounter (Signed)
I spoke with pt and gave advise per Dr. Clent Ridges. Take Lantus 40 units, Humalog 20 units at breakfast & 25 untis at dinner and then call us back in a couple of days to let us know how the blood sugar levels are.

## 2012-11-13 ENCOUNTER — Encounter (HOSPITAL_COMMUNITY): Payer: Self-pay | Admitting: Emergency Medicine

## 2012-11-13 ENCOUNTER — Emergency Department (HOSPITAL_COMMUNITY): Payer: Medicare Other

## 2012-11-13 ENCOUNTER — Telehealth: Payer: Self-pay | Admitting: Family Medicine

## 2012-11-13 ENCOUNTER — Emergency Department (HOSPITAL_COMMUNITY)
Admission: EM | Admit: 2012-11-13 | Discharge: 2012-11-13 | Disposition: A | Discharge status: Home / Self Care | Payer: Medicare Other | Source: Home / Self Care | Attending: Emergency Medicine | Admitting: Emergency Medicine

## 2012-11-13 DIAGNOSIS — R7309 Other abnormal glucose: Secondary | ICD-10-CM | POA: Insufficient documentation

## 2012-11-13 DIAGNOSIS — Z8739 Personal history of other diseases of the musculoskeletal system and connective tissue: Secondary | ICD-10-CM | POA: Insufficient documentation

## 2012-11-13 DIAGNOSIS — R079 Chest pain, unspecified: Secondary | ICD-10-CM | POA: Insufficient documentation

## 2012-11-13 DIAGNOSIS — E119 Type 2 diabetes mellitus without complications: Secondary | ICD-10-CM | POA: Insufficient documentation

## 2012-11-13 DIAGNOSIS — Z862 Personal history of diseases of the blood and blood-forming organs and certain disorders involving the immune mechanism: Secondary | ICD-10-CM | POA: Insufficient documentation

## 2012-11-13 DIAGNOSIS — Z794 Long term (current) use of insulin: Secondary | ICD-10-CM | POA: Insufficient documentation

## 2012-11-13 DIAGNOSIS — Z8541 Personal history of malignant neoplasm of cervix uteri: Secondary | ICD-10-CM | POA: Insufficient documentation

## 2012-11-13 DIAGNOSIS — R739 Hyperglycemia, unspecified: Secondary | ICD-10-CM

## 2012-11-13 DIAGNOSIS — F3289 Other specified depressive episodes: Secondary | ICD-10-CM | POA: Insufficient documentation

## 2012-11-13 DIAGNOSIS — Z85118 Personal history of other malignant neoplasm of bronchus and lung: Secondary | ICD-10-CM | POA: Insufficient documentation

## 2012-11-13 DIAGNOSIS — G8929 Other chronic pain: Secondary | ICD-10-CM | POA: Insufficient documentation

## 2012-11-13 DIAGNOSIS — R011 Cardiac murmur, unspecified: Secondary | ICD-10-CM | POA: Insufficient documentation

## 2012-11-13 DIAGNOSIS — I10 Essential (primary) hypertension: Secondary | ICD-10-CM | POA: Insufficient documentation

## 2012-11-13 DIAGNOSIS — R5381 Other malaise: Secondary | ICD-10-CM | POA: Insufficient documentation

## 2012-11-13 DIAGNOSIS — J441 Chronic obstructive pulmonary disease with (acute) exacerbation: Secondary | ICD-10-CM | POA: Insufficient documentation

## 2012-11-13 DIAGNOSIS — R109 Unspecified abdominal pain: Secondary | ICD-10-CM | POA: Insufficient documentation

## 2012-11-13 DIAGNOSIS — Z880 Allergy status to penicillin: Secondary | ICD-10-CM | POA: Insufficient documentation

## 2012-11-13 DIAGNOSIS — Z87891 Personal history of nicotine dependence: Secondary | ICD-10-CM | POA: Insufficient documentation

## 2012-11-13 DIAGNOSIS — F329 Major depressive disorder, single episode, unspecified: Secondary | ICD-10-CM | POA: Insufficient documentation

## 2012-11-13 DIAGNOSIS — F411 Generalized anxiety disorder: Secondary | ICD-10-CM | POA: Insufficient documentation

## 2012-11-13 DIAGNOSIS — K219 Gastro-esophageal reflux disease without esophagitis: Secondary | ICD-10-CM | POA: Insufficient documentation

## 2012-11-13 DIAGNOSIS — Z79899 Other long term (current) drug therapy: Secondary | ICD-10-CM | POA: Insufficient documentation

## 2012-11-13 LAB — GLUCOSE, CAPILLARY
Glucose-Capillary: 466 mg/dL — ABNORMAL HIGH (ref 70–99)
Glucose-Capillary: 364 mg/dL — ABNORMAL HIGH (ref 70–99)
Glucose-Capillary: 305 mg/dL — ABNORMAL HIGH (ref 70–99)
Glucose-Capillary: 218 mg/dL — ABNORMAL HIGH (ref 70–99)

## 2012-11-13 LAB — URINALYSIS, ROUTINE W REFLEX MICROSCOPIC
Bilirubin Urine: NEGATIVE
Glucose, UA: >1000 mg/dL — AB
Ketones, ur: NEGATIVE mg/dL
Nitrite: NEGATIVE
Protein, ur: NEGATIVE mg/dL
pH: 5.5 (ref 5.0–8.0)

## 2012-11-13 LAB — CBC
HCT: 30.1 % — ABNORMAL LOW (ref 36.0–46.0)
MCV: 87.5 fL (ref 78.0–100.0)
Platelets: 39 10*3/uL — ABNORMAL LOW (ref 150–400)
RBC: 3.44 MIL/uL — ABNORMAL LOW (ref 3.87–5.11)
WBC: 2.4 10*3/uL — ABNORMAL LOW (ref 4.0–10.5)

## 2012-11-13 LAB — BASIC METABOLIC PANEL
BUN: 25 mg/dL — ABNORMAL HIGH (ref 6–23)
Chloride: 99 mEq/L (ref 96–112)
GFR calc Af Amer: 66 mL/min — ABNORMAL LOW (ref >90)
Potassium: 4.9 mEq/L (ref 3.5–5.1)

## 2012-11-13 LAB — POCT I-STAT TROPONIN I: Troponin i, poc: 0 ng/mL (ref 0.00–0.08)

## 2012-11-13 LAB — PRO B NATRIURETIC PEPTIDE: Pro B Natriuretic peptide (BNP): 67 pg/mL (ref 0–125)

## 2012-11-13 LAB — URINE MICROSCOPIC-ADD ON

## 2012-11-13 MED ORDER — INSULIN ASPART 100 UNIT/ML ~~LOC~~ SOLN
10.0000 [IU] | Freq: Once | SUBCUTANEOUS | Status: AC
  Administered 2012-11-13: 10 [IU] via INTRAVENOUS
  Filled 2012-11-13: qty 1

## 2012-11-13 MED ORDER — HYDROMORPHONE HCL PF 1 MG/ML IJ SOLN
1.0000 mg | Freq: Once | INTRAMUSCULAR | Status: AC
  Administered 2012-11-13: 1 mg via INTRAVENOUS
  Filled 2012-11-13: qty 1

## 2012-11-13 MED ORDER — ONDANSETRON HCL 4 MG/2ML IJ SOLN
4.0000 mg | Freq: Once | INTRAMUSCULAR | Status: AC
  Administered 2012-11-13: 4 mg via INTRAVENOUS
  Filled 2012-11-13: qty 2

## 2012-11-13 MED ORDER — SODIUM CHLORIDE 0.9 % IV BOLUS (SEPSIS)
1000.0000 mL | Freq: Once | INTRAVENOUS | Status: AC
  Administered 2012-11-13: 1000 mL via INTRAVENOUS

## 2012-11-13 NOTE — ED Provider Notes (Signed)
CSN: [id number]     Arrival date & time 11/13/12  1313 History   First MD Initiated Contact with Patient 11/13/12 1329     Chief Complaint  Patient presents with  . Hyperglycemia  . Chest Pain  . Shortness of Breath   (Consider location/radiation/quality/duration/timing/severity/associated sxs/prior Treatment) HPI  62 year old female with hyperglycemia. Patient recently started on insulin.Her sugars have been persistently high despite this. The past several days they've been as high as the 500s. She reports low it has been is between 250 and 300. Mild fatigue. No fevers or chills. No cough. No urinary complaints. Patient reports abdominal pain but that this is chronic in nature. Denies any acute change. Recent ER evaluations as well as by her primary care provider for the same. She reports that she's been taking her medicines prescribed including the recent recommendation to increase her insulin dosing.  Past Medical History  Diagnosis Date  . Cirrhosis   . Diabetes mellitus   . GERD (gastroesophageal reflux disease)   . Cervical disc syndrome   . Lung cancer     squamous cell  . Chronic respiratory failure   . Diverticulitis of colon   . Perforation of colon   . Arthritis   . IBS (irritable bowel syndrome)   . Chronic lower GI bleeding   . Overactive bladder   . Cervical cancer   . Thrombocytopenia     sees Dr. Arbutus Ped   . Splenomegaly   . Anxiety   . Depression   . Hypertension   . Asthma   . Chronic headache disorder   . Blood transfusion without reported diagnosis   . Heart murmur   . Hyperlipidemia   . Lung cancer   . COPD (chronic obstructive pulmonary disease)     sees Dr. Shelle Iron   . Anemia     sees Dr. Arbutus Ped, due to chronic disease and GI losses    Past Surgical History  Procedure Laterality Date  . Appendectomy    . Cholecystectomy    . Colostomy    . Pneumonectomy    . Cervix removed    . Bowel resection    . Esophagogastroduodenoscopy  03/19/2011    Procedure: ESOPHAGOGASTRODUODENOSCOPY (EGD);  Surgeon: Louis Meckel, MD;  Location: [facility];  Service: Endoscopy;  Laterality: N/A;  . Colonoscopy  03/19/2011    Procedure: COLONOSCOPY;  Surgeon: Louis Meckel, MD;  Location: [facility];  Service: Endoscopy;  Laterality: N/A;  . Givens capsule study  03/20/2011    Procedure: GIVENS CAPSULE STUDY;  Surgeon: Louis Meckel, MD;  Location: [facility];  Service: Endoscopy;  Laterality: N/A;  . Small bowel obstruction repair  March 2012  . Umbilical hernia repair  March 2012   Family History  Problem Relation Age of Onset  . Coronary artery disease    . Diabetes type II    . Anesthesia problems Neg Hx   . Hypotension Neg Hx   . Malignant hyperthermia Neg Hx   . Pseudochol deficiency Neg Hx    History  Substance Use Topics  . Smoking status: Former Smoker -- 2.00 packs/day for 35 years    Types: Cigarettes    Quit date: 03/24/2002  . Smokeless tobacco: Never Used  . Alcohol Use: No   OB History   Grav Para Term Preterm Abortions TAB SAB Ect Mult Living                 Review of Systems  All systems reviewed and  negative, other than as noted in HPI.   Allergies  Codeine phosphate; Hydrocodone-acetaminophen; Morphine; Penicillins; and Cephalexin  Home Medications   Current Outpatient Rx  Name  Route  Sig  Dispense  Refill  . albuterol-ipratropium (COMBIVENT) 18-103 MCG/ACT inhaler   Inhalation   Inhale 2 puffs into the lungs every 4 (four) hours as needed for wheezing or shortness of breath. For asthma          . cholecalciferol (VITAMIN D) 1000 UNITS tablet   Oral   Take 1,000 Units by mouth daily.          . diazepam (VALIUM) 5 MG tablet   Oral   Take 1 tablet (5 mg total) by mouth 3 (three) times daily. For anxiety   90 tablet   5   . dicyclomine (BENTYL) 10 MG capsule   Oral   Take 1 capsule (10 mg total) by mouth 3 (three) times daily.   90 capsule   11   . esomeprazole (NEXIUM) 40 MG  capsule   Oral   Take 40 mg by mouth every evening.         . Fluticasone-Salmeterol (ADVAIR) 250-50 MCG/DOSE AEPB   Inhalation   Inhale 1 puff into the lungs every 12 (twelve) hours.   60 each   11   . folic acid (FOLVITE) 1 MG tablet   Oral   Take 1 mg by mouth every evening.          . furosemide (LASIX) 20 MG tablet   Oral   Take 20 mg by mouth daily.          . insulin glargine (LANTUS) 100 UNIT/ML injection   Subcutaneous   Inject 40 Units into the skin at bedtime.         . insulin lispro (HUMALOG) 100 UNIT/ML injection   Subcutaneous   Inject 20-25 Units into the skin See admin instructions. 20 units at breakfast and 25 units at dinner         . metFORMIN (GLUCOPHAGE) 1000 MG tablet   Oral   Take 1,000 mg by mouth 2 (two) times daily with a meal.         . metoCLOPramide (REGLAN) 10 MG tablet   Oral   Take 10 mg by mouth 3 (three) times daily.         . Oxycodone HCl 20 MG TABS   Oral   Take 20 mg by mouth every 3 (three) hours as needed (pain).         . potassium chloride (MICRO-K) 10 MEQ CR capsule   Oral   Take 1 capsule by mouth daily.         . promethazine (PHENERGAN) 25 MG tablet   Oral   Take 25 mg by mouth every 6 (six) hours as needed for nausea. For nausea         . rifaximin (XIFAXAN) 550 MG TABS tablet   Oral   Take 550 mg by mouth 2 (two) times daily.         [name] tolterodine (DETROL) 2 MG tablet   Oral   Take 2 mg by mouth daily.         . traZODone (DESYREL) 100 MG tablet   Oral   Take 2 tablets (200 mg total) by mouth at bedtime.   60 tablet   11    BP 141/93  Pulse 74  Temp(Src) 98 F (36.7 C) (Oral)  Resp 20  Ht  5' (1.524 m)  Wt 180 lb (81.647 kg)  BMI 35.15 kg/m2  SpO2 100% Physical Exam  Nursing note and vitals reviewed. Constitutional: She appears well-developed and well-nourished. No distress.  HENT:  Head: Normocephalic and atraumatic.  Eyes: Conjunctivae are normal. Right eye exhibits no  discharge. Left eye exhibits no discharge.  Neck: Neck supple.  Cardiovascular: Normal rate, regular rhythm and normal heart sounds.  Exam reveals no gallop and no friction rub.   No murmur heard. Pulmonary/Chest: Effort normal and breath sounds normal. No respiratory distress.  Abdominal: Soft. She exhibits no distension. There is tenderness.  Mild diffuse tenderness without rebound or guarding.  Musculoskeletal: She exhibits no edema and no tenderness.  Neurological: She is alert.  Skin: Skin is warm and dry. She is not diaphoretic.  Psychiatric: She has a normal mood and affect. Her behavior is normal. Thought content normal.    ED Course  Procedures (including critical care time) Labs Review Labs Reviewed  CBC - Abnormal; Notable for the following:    WBC 2.4 (*)    RBC 3.44 (*)    Hemoglobin 10.1 (*)    HCT 30.1 (*)    RDW 19.6 (*)    Platelets 39 (*)    All other components within normal limits  BASIC METABOLIC PANEL - Abnormal; Notable for the following:    Sodium 134 (*)    Glucose, Bld 481 (*)    BUN 25 (*)    GFR calc non Af Amer 57 (*)    GFR calc Af Amer 66 (*)    All other components within normal limits  GLUCOSE, CAPILLARY - Abnormal; Notable for the following:    Glucose-Capillary 466 (*)    All other components within normal limits  URINALYSIS, ROUTINE W REFLEX MICROSCOPIC - Abnormal; Notable for the following:    Glucose, UA >1000 (*)    Leukocytes, UA TRACE (*)    All other components within normal limits  GLUCOSE, CAPILLARY - Abnormal; Notable for the following:    Glucose-Capillary 364 (*)    All other components within normal limits  PRO B NATRIURETIC PEPTIDE  URINE MICROSCOPIC-ADD ON  POCT I-STAT TROPONIN I   Imaging Review Dg Chest 2 View  11/13/2012   CLINICAL DATA:  Chest pain. Shortness of breath.  EXAM: CHEST  2 VIEW  COMPARISON:  Chest radiograph 11/06/2012.  FINDINGS: Stable enlarged cardiac and mediastinal contours. Low lung volumes. No  large consolidative pulmonary opacity. No pleural effusion or pneumothorax. Regional skeleton is unremarkable. Cholecystectomy clips.  IMPRESSION: No acute cardiopulmonary process.   Electronically Signed   By: Annia Belt M.D.   On: 11/13/2012 14:33    EKG Interpretation   None       MDM   1. Hyperglycemia   2. Pancytopenia   62 year old female with hyperglycemia without acidosis. No ketonuria. Patient was treated with IV fluids and insulin with improvement of her blood sugar.  She reports that her blood sugars have been consistently high and for the past several days the lowest they have been is in 250-300 range. She reports compliance with her medications. Will have her increase her Lantus from 40 units to 50 units and maintain her other medications as prescribed. She is to follow back up with Dr. Starr Sinclair PCP, to discuss further. She is becoming discouraged. Reassurance provided.  Discussed that sometimes finding most appropriate insulin regimen takes time and at she needs to continue to focus on things that she can control such as  her diet, regular exercise and taking her medications appropriately. Additionally, pancytopenia noted. Per review of records, this does not appear to be acute. Patient with no overt signs of bleeding.   Raeford Razor, MD 11/17/12 1452

## 2012-11-13 NOTE — ED Notes (Signed)
MD at bedside. 

## 2012-11-13 NOTE — ED Notes (Signed)
Checked patient blood sugar it was 364 notified RN Brittney of blood sugar

## 2012-11-13 NOTE — ED Notes (Signed)
Recent diagnosis of DM. EMS stated that MD is attempting to figure out insulin. Currently complains of SOB, chest pain 12 lead-NSR Pt took 2 units of Humalog with breakfast (0900) (Usually takes 3 x a day) Lantus at bedtime.  CBG-548--EMS Pt used O2 at home  at home PRN  18 R FA ,900 ml total of NS.  BP-148/90 HR-78 SpO2-98% 2 L

## 2012-11-13 NOTE — ED Notes (Signed)
Checked patient blood sugar it was 218 notified RN Brittney of blood sugar

## 2012-11-13 NOTE — ED Notes (Signed)
Checked patient blood sugar it was 305 notified RN Brittney of blood sugar

## 2012-11-13 NOTE — Telephone Encounter (Signed)
FYI

## 2012-11-13 NOTE — Telephone Encounter (Signed)
Noted  

## 2012-11-13 NOTE — Telephone Encounter (Signed)
Patient Information:  Caller Name: Shayda  Phone: [phone]  Patient: Storm Frisk  Gender: Female  DOB: 08/22/1950  Age: 62 Years  PCP: Gershon Crane [facility])  Office Follow Up:  Does the office need to follow up with this patient?: Yes  Instructions For The Office: Please make sure she f/u. BS have not been below 280 for over a week.  RN Note:  Advised pt to call 911 now. She verbalized understanding and will do so as soon as she can.  Symptoms  Reason For Call & Symptoms: Pt calling regarding blood sugar issues at lunch time. Takes Humalog with breakfast and dinner but not with lunches. 280's-500's. Nothing below below 280 in the last week (11/13/12). Seen on 11/09/12 a the ER for same. Insulin chx but not helping with high blood sugars. Blood sugar now is 525; no sliding scale coverage and next Humalog due at dinner. Pt states she feels very weak. Advise her to call 911 now. Pt very tearful and stated she did not want to do that b/c they made her feel stupid at the ER last time, "like they didn't understand why I called or was there". Apologized for her previous experience but told her it was important to call now. Pt verbalized undertanding. Insists on calling daughter first as her 4 grandchildren are with her now.  Reviewed Health History In EMR: Yes  Reviewed Medications In EMR: Yes  Reviewed Allergies In EMR: Yes  Reviewed Surgeries / Procedures: Yes  Date of Onset of Symptoms: 11/09/2012  Guideline(s) Used:  Diabetes - High Blood Sugar  Disposition Per Guideline:   Call EMS 911 Now  Reason For Disposition Reached:   Very weak (e.g., can't stand)  Advice Given:  N/A  Patient Will Follow Care Advice:  YES

## 2012-11-14 ENCOUNTER — Inpatient Hospital Stay (HOSPITAL_COMMUNITY)
Admission: EM | Admit: 2012-11-14 | Discharge: 2012-11-16 | DRG: 638 | Disposition: A | Discharge status: Home / Self Care | Payer: Medicare Other | Attending: Internal Medicine | Admitting: Internal Medicine

## 2012-11-14 ENCOUNTER — Encounter (HOSPITAL_COMMUNITY): Payer: Self-pay | Admitting: Emergency Medicine

## 2012-11-14 DIAGNOSIS — Z85118 Personal history of other malignant neoplasm of bronchus and lung: Secondary | ICD-10-CM

## 2012-11-14 DIAGNOSIS — Z79899 Other long term (current) drug therapy: Secondary | ICD-10-CM

## 2012-11-14 DIAGNOSIS — J449 Chronic obstructive pulmonary disease, unspecified: Secondary | ICD-10-CM | POA: Diagnosis present

## 2012-11-14 DIAGNOSIS — F3289 Other specified depressive episodes: Secondary | ICD-10-CM | POA: Diagnosis present

## 2012-11-14 DIAGNOSIS — E8881 Metabolic syndrome: Secondary | ICD-10-CM | POA: Diagnosis present

## 2012-11-14 DIAGNOSIS — E1169 Type 2 diabetes mellitus with other specified complication: Secondary | ICD-10-CM | POA: Diagnosis present

## 2012-11-14 DIAGNOSIS — I10 Essential (primary) hypertension: Secondary | ICD-10-CM | POA: Diagnosis present

## 2012-11-14 DIAGNOSIS — Z9981 Dependence on supplemental oxygen: Secondary | ICD-10-CM

## 2012-11-14 DIAGNOSIS — K219 Gastro-esophageal reflux disease without esophagitis: Secondary | ICD-10-CM | POA: Diagnosis present

## 2012-11-14 DIAGNOSIS — N179 Acute kidney failure, unspecified: Secondary | ICD-10-CM | POA: Diagnosis present

## 2012-11-14 DIAGNOSIS — D696 Thrombocytopenia, unspecified: Secondary | ICD-10-CM | POA: Diagnosis present

## 2012-11-14 DIAGNOSIS — Z794 Long term (current) use of insulin: Secondary | ICD-10-CM

## 2012-11-14 DIAGNOSIS — Z87891 Personal history of nicotine dependence: Secondary | ICD-10-CM

## 2012-11-14 DIAGNOSIS — Z8541 Personal history of malignant neoplasm of cervix uteri: Secondary | ICD-10-CM

## 2012-11-14 DIAGNOSIS — D61818 Other pancytopenia: Secondary | ICD-10-CM | POA: Diagnosis present

## 2012-11-14 DIAGNOSIS — K7460 Unspecified cirrhosis of liver: Secondary | ICD-10-CM | POA: Diagnosis present

## 2012-11-14 DIAGNOSIS — R109 Unspecified abdominal pain: Secondary | ICD-10-CM

## 2012-11-14 DIAGNOSIS — F411 Generalized anxiety disorder: Secondary | ICD-10-CM | POA: Diagnosis present

## 2012-11-14 DIAGNOSIS — Z9221 Personal history of antineoplastic chemotherapy: Secondary | ICD-10-CM

## 2012-11-14 DIAGNOSIS — R739 Hyperglycemia, unspecified: Secondary | ICD-10-CM

## 2012-11-14 DIAGNOSIS — F329 Major depressive disorder, single episode, unspecified: Secondary | ICD-10-CM | POA: Diagnosis present

## 2012-11-14 DIAGNOSIS — M129 Arthropathy, unspecified: Secondary | ICD-10-CM | POA: Diagnosis present

## 2012-11-14 DIAGNOSIS — E1101 Type 2 diabetes mellitus with hyperosmolarity with coma: Principal | ICD-10-CM | POA: Diagnosis present

## 2012-11-14 DIAGNOSIS — E1140 Type 2 diabetes mellitus with diabetic neuropathy, unspecified: Secondary | ICD-10-CM | POA: Diagnosis present

## 2012-11-14 DIAGNOSIS — J9610 Chronic respiratory failure, unspecified whether with hypoxia or hypercapnia: Secondary | ICD-10-CM | POA: Diagnosis present

## 2012-11-14 DIAGNOSIS — E119 Type 2 diabetes mellitus without complications: Secondary | ICD-10-CM

## 2012-11-14 DIAGNOSIS — E785 Hyperlipidemia, unspecified: Secondary | ICD-10-CM | POA: Diagnosis present

## 2012-11-14 DIAGNOSIS — J4489 Other specified chronic obstructive pulmonary disease: Secondary | ICD-10-CM | POA: Diagnosis present

## 2012-11-14 LAB — BASIC METABOLIC PANEL
BUN: 23 mg/dL (ref 6–23)
Chloride: 97 mEq/L (ref 96–112)
Creatinine, Ser: 1.13 mg/dL — ABNORMAL HIGH (ref 0.50–1.10)
GFR calc Af Amer: 60 mL/min — ABNORMAL LOW (ref >90)
GFR calc non Af Amer: 52 mL/min — ABNORMAL LOW (ref >90)
Potassium: 4.4 mEq/L (ref 3.5–5.1)
Sodium: 136 mEq/L (ref 135–145)

## 2012-11-14 LAB — CBC
HCT: 34.2 % — ABNORMAL LOW (ref 36.0–46.0)
Hemoglobin: 11.5 g/dL — ABNORMAL LOW (ref 12.0–15.0)
MCHC: 33.6 g/dL (ref 30.0–36.0)
MCV: 87.7 fL (ref 78.0–100.0)
Platelets: 48 10*3/uL — ABNORMAL LOW (ref 150–400)
RDW: 19.5 % — ABNORMAL HIGH (ref 11.5–15.5)
WBC: 3.3 10*3/uL — ABNORMAL LOW (ref 4.0–10.5)

## 2012-11-14 LAB — HEPATIC FUNCTION PANEL
ALT: 45 U/L — ABNORMAL HIGH (ref 0–35)
AST: 47 U/L — ABNORMAL HIGH (ref 0–37)
Bilirubin, Direct: 0.1 mg/dL (ref 0.0–0.3)
Indirect Bilirubin: 0.3 mg/dL (ref 0.3–0.9)
Total Protein: 7.7 g/dL (ref 6.0–8.3)

## 2012-11-14 LAB — GLUCOSE, CAPILLARY
Glucose-Capillary: 401 mg/dL — ABNORMAL HIGH (ref 70–99)
Glucose-Capillary: 209 mg/dL — ABNORMAL HIGH (ref 70–99)

## 2012-11-14 LAB — LIPASE, BLOOD: Lipase: 76 U/L — ABNORMAL HIGH (ref 11–59)

## 2012-11-14 MED ORDER — SODIUM CHLORIDE 0.9 % IV SOLN
INTRAVENOUS | Status: DC
  Administered 2012-11-14 – 2012-11-15 (×2): via INTRAVENOUS

## 2012-11-14 MED ORDER — ONDANSETRON HCL 4 MG/2ML IJ SOLN: 4.0000 mg | Freq: Four times a day (QID) | INTRAMUSCULAR | Status: DC | PRN

## 2012-11-14 MED ORDER — INSULIN ASPART 100 UNIT/ML ~~LOC~~ SOLN
0.0000 [IU] | Freq: Three times a day (TID) | SUBCUTANEOUS | Status: DC
  Administered 2012-11-14: 20:00:00 2 [IU] via SUBCUTANEOUS
  Administered 2012-11-15: 3 [IU] via SUBCUTANEOUS
  Administered 2012-11-15: 09:00:00 2 [IU] via SUBCUTANEOUS
  Administered 2012-11-15: 7 [IU] via SUBCUTANEOUS
  Administered 2012-11-16 (×2): 3 [IU] via SUBCUTANEOUS

## 2012-11-14 MED ORDER — HYDROMORPHONE HCL PF 1 MG/ML IJ SOLN
0.5000 mg | INTRAMUSCULAR | Status: DC | PRN
  Administered 2012-11-14 – 2012-11-15 (×4): 0.5 mg via INTRAVENOUS
  Filled 2012-11-14 (×4): qty 1

## 2012-11-14 MED ORDER — SODIUM CHLORIDE 0.9 % IV BOLUS (SEPSIS)
1000.0000 mL | Freq: Once | INTRAVENOUS | Status: AC
  Administered 2012-11-14: 1000 mL via INTRAVENOUS

## 2012-11-14 MED ORDER — HYDROMORPHONE HCL PF 1 MG/ML IJ SOLN
1.0000 mg | Freq: Once | INTRAMUSCULAR | Status: AC
  Administered 2012-11-14: 1 mg via INTRAVENOUS
  Filled 2012-11-14: qty 1

## 2012-11-14 MED ORDER — METOCLOPRAMIDE HCL 10 MG PO TABS
10.0000 mg | ORAL_TABLET | Freq: Three times a day (TID) | ORAL | Status: DC
  Administered 2012-11-14 – 2012-11-16 (×5): 10 mg via ORAL
  Filled 2012-11-14 (×8): qty 1

## 2012-11-14 MED ORDER — PROMETHAZINE HCL 25 MG PO TABS
25.0000 mg | ORAL_TABLET | Freq: Four times a day (QID) | ORAL | Status: DC | PRN
  Administered 2012-11-14 – 2012-11-15 (×2): 25 mg via ORAL
  Filled 2012-11-14 (×3): qty 1

## 2012-11-14 MED ORDER — DIAZEPAM 5 MG PO TABS
5.0000 mg | ORAL_TABLET | Freq: Three times a day (TID) | ORAL | Status: DC
  Administered 2012-11-14 – 2012-11-16 (×5): 5 mg via ORAL
  Filled 2012-11-14 (×5): qty 1

## 2012-11-14 MED ORDER — TRAZODONE HCL 100 MG PO TABS
200.0000 mg | ORAL_TABLET | Freq: Every day | ORAL | Status: DC
  Administered 2012-11-14 – 2012-11-15 (×2): 200 mg via ORAL
  Filled 2012-11-14 (×4): qty 2

## 2012-11-14 MED ORDER — PANTOPRAZOLE SODIUM 40 MG PO TBEC
40.0000 mg | DELAYED_RELEASE_TABLET | Freq: Every day | ORAL | Status: DC
  Administered 2012-11-15 – 2012-11-16 (×2): 40 mg via ORAL
  Filled 2012-11-14 (×2): qty 1

## 2012-11-14 MED ORDER — INSULIN ASPART 100 UNIT/ML ~~LOC~~ SOLN: 0.0000 [IU] | Freq: Three times a day (TID) | SUBCUTANEOUS | Status: DC

## 2012-11-14 MED ORDER — DEXTROSE 50 % IV SOLN: 25.0000 mL | INTRAVENOUS | Status: DC | PRN

## 2012-11-14 MED ORDER — DEXTROSE-NACL 5-0.45 % IV SOLN: INTRAVENOUS | Status: DC

## 2012-11-14 MED ORDER — MOMETASONE FURO-FORMOTEROL FUM 100-5 MCG/ACT IN AERO
2.0000 | INHALATION_SPRAY | Freq: Two times a day (BID) | RESPIRATORY_TRACT | Status: DC
  Administered 2012-11-14 – 2012-11-15 (×3): 2 via RESPIRATORY_TRACT
  Filled 2012-11-14: qty 8.8

## 2012-11-14 MED ORDER — ONDANSETRON HCL 4 MG PO TABS: 4.0000 mg | ORAL_TABLET | Freq: Four times a day (QID) | ORAL | Status: DC | PRN

## 2012-11-14 MED ORDER — IPRATROPIUM-ALBUTEROL 18-103 MCG/ACT IN AERO
2.0000 | INHALATION_SPRAY | RESPIRATORY_TRACT | Status: DC | PRN
  Filled 2012-11-14: qty 14.7

## 2012-11-14 MED ORDER — INSULIN ASPART 100 UNIT/ML ~~LOC~~ SOLN: 0.0000 [IU] | Freq: Every day | SUBCUTANEOUS | Status: DC

## 2012-11-14 MED ORDER — RIFAXIMIN 550 MG PO TABS
550.0000 mg | ORAL_TABLET | Freq: Two times a day (BID) | ORAL | Status: DC
  Administered 2012-11-14 – 2012-11-16 (×4): 550 mg via ORAL
  Filled 2012-11-14 (×5): qty 1

## 2012-11-14 MED ORDER — INSULIN ASPART 100 UNIT/ML ~~LOC~~ SOLN: 4.0000 [IU] | Freq: Three times a day (TID) | SUBCUTANEOUS | Status: DC

## 2012-11-14 MED ORDER — OXYBUTYNIN CHLORIDE ER 5 MG PO TB24
5.0000 mg | ORAL_TABLET | Freq: Every day | ORAL | Status: DC
  Administered 2012-11-14 – 2012-11-15 (×2): 5 mg via ORAL
  Filled 2012-11-14 (×3): qty 1

## 2012-11-14 MED ORDER — OXYCODONE HCL 5 MG PO TABS
20.0000 mg | ORAL_TABLET | ORAL | Status: DC | PRN
  Administered 2012-11-14 – 2012-11-16 (×9): 20 mg via ORAL
  Filled 2012-11-14 (×9): qty 4

## 2012-11-14 MED ORDER — INSULIN ASPART 100 UNIT/ML ~~LOC~~ SOLN
10.0000 [IU] | Freq: Once | SUBCUTANEOUS | Status: AC
  Administered 2012-11-14: 10 [IU] via INTRAVENOUS
  Filled 2012-11-14: qty 1

## 2012-11-14 MED ORDER — INSULIN ASPART 100 UNIT/ML ~~LOC~~ SOLN: 1.0000 [IU] | Freq: Three times a day (TID) | SUBCUTANEOUS | Status: DC

## 2012-11-14 MED ORDER — INSULIN ASPART 100 UNIT/ML ~~LOC~~ SOLN
0.0000 [IU] | Freq: Every day | SUBCUTANEOUS | Status: DC
  Administered 2012-11-15: 2 [IU] via SUBCUTANEOUS

## 2012-11-14 MED ORDER — HEPARIN SODIUM (PORCINE) 5000 UNIT/ML IJ SOLN
5000.0000 [IU] | Freq: Three times a day (TID) | INTRAMUSCULAR | Status: DC
  Filled 2012-11-14 (×8): qty 1

## 2012-11-14 MED ORDER — SODIUM CHLORIDE 0.9 % IV SOLN
INTRAVENOUS | Status: DC
  Filled 2012-11-14: qty 1

## 2012-11-14 MED ORDER — INSULIN REGULAR BOLUS VIA INFUSION
0.0000 [IU] | Freq: Three times a day (TID) | INTRAVENOUS | Status: DC
  Filled 2012-11-14: qty 10

## 2012-11-14 MED ORDER — INSULIN GLARGINE 100 UNIT/ML ~~LOC~~ SOLN
50.0000 [IU] | Freq: Two times a day (BID) | SUBCUTANEOUS | Status: DC
  Administered 2012-11-14 – 2012-11-16 (×4): 50 [IU] via SUBCUTANEOUS
  Filled 2012-11-14 (×5): qty 0.5

## 2012-11-14 MED ORDER — OXYCODONE HCL 20 MG PO TABS: 20.0000 mg | ORAL_TABLET | ORAL | Status: DC | PRN

## 2012-11-14 NOTE — H&P (Addendum)
[facility] History and Physical  [name]:[id number] DOB: [birth year] DOA: 11/14/2012  Referring physician: Dr. Lynelle Doctor PCP: [name], MD  Specialists: none  Chief Complaint: Hyperglycemia  HPI: [name] is a 62 y.o. female  Past medical history of hypertension and diabetes, non-small cell lung cancer status post left upper lobectomy on chemotherapy, comes in for hyperglycemia. She has been to the ED 3 times as her blood glucose are hard to control. She relates no chest pain shortness of breath burning when she urinates no cough. She does relate some dizziness upon standing. She relates her sugars every time she gets home there was 500 and her blood glucoses not being controlled. She has no new medications and does not consume alcohol.  Review of Systems: The patient denies anorexia, fever, weight loss,, vision loss, decreased hearing, hoarseness, chest pain, syncope, dyspnea on exertion, peripheral edema, hemoptysis, abdominal pain, melena, hematochezia, severe indigestion/heartburn, hematuria, incontinence, genital sores, muscle weakness, suspicious skin lesions, transient blindness, difficulty walking, depression, unusual weight change, abnormal bleeding, enlarged lymph nodes, angioedema, and breast masses.    Past Medical History  Diagnosis Date  . Cirrhosis   . Diabetes mellitus   . GERD (gastroesophageal reflux disease)   . Cervical disc syndrome   . Lung cancer     squamous cell  . Chronic respiratory failure   . Diverticulitis of colon   . Perforation of colon   . Arthritis   . IBS (irritable bowel syndrome)   . Chronic lower GI bleeding   . Overactive bladder   . Cervical cancer   . Thrombocytopenia     sees Dr. Arbutus Ped   . Splenomegaly   . Anxiety   . Depression   . Hypertension   . Asthma   . Chronic headache disorder   . Blood transfusion without reported diagnosis   . Heart murmur   . Hyperlipidemia   . Lung cancer   . COPD (chronic  obstructive pulmonary disease)     sees Dr. Shelle Iron   . Anemia     sees Dr. Arbutus Ped, due to chronic disease and GI losses    Past Surgical History  Procedure Laterality Date  . Appendectomy    . Cholecystectomy    . Colostomy    . Pneumonectomy    . Cervix removed    . Bowel resection    . Esophagogastroduodenoscopy  03/19/2011    Procedure: ESOPHAGOGASTRODUODENOSCOPY (EGD);  Surgeon: Louis Meckel, MD;  Location: [facility];  Service: Endoscopy;  Laterality: N/A;  . Colonoscopy  03/19/2011    Procedure: COLONOSCOPY;  Surgeon: Louis Meckel, MD;  Location: [facility];  Service: Endoscopy;  Laterality: N/A;  . Givens capsule study  03/20/2011    Procedure: GIVENS CAPSULE STUDY;  Surgeon: Louis Meckel, MD;  Location: [facility];  Service: Endoscopy;  Laterality: N/A;  . Small bowel obstruction repair  March 2012  . Umbilical hernia repair  March 2012   Social History:  reports that she quit smoking about 10 years ago. Her smoking use included Cigarettes. She has a 70 pack-year smoking history. She has never used smokeless tobacco. She reports that she does not drink alcohol or use illicit drugs. Lives at home  Allergies  Allergen Reactions  . Codeine Phosphate     Stomach cramps  . Hydrocodone-Acetaminophen     hallucinations  . Morphine     Lowers BP  . Penicillins     Rash, tongue swelling  . Cephalexin Swelling  and Rash    Family History  Problem Relation Age of Onset  . Coronary artery disease    . Diabetes type II    . Anesthesia problems Neg Hx   . Hypotension Neg Hx   . Malignant hyperthermia Neg Hx   . Pseudochol deficiency Neg Hx   . Heart attack Mother   . Diabetes type II Mother   . Cirrhosis Father     Prior to Admission medications   Medication Sig Start Date End Date Taking? Authorizing Provider  albuterol-ipratropium (COMBIVENT) 18-103 MCG/ACT inhaler Inhale 2 puffs into the lungs every 4 (four) hours as needed for wheezing or shortness of  breath. For asthma    Yes Historical Provider, MD  cholecalciferol (VITAMIN D) 1000 UNITS tablet Take 1,000 Units by mouth daily.    Yes Historical Provider, MD  diazepam (VALIUM) 5 MG tablet Take 1 tablet (5 mg total) by mouth 3 (three) times daily. For anxiety 11/04/12  Yes [name], MD  dicyclomine (BENTYL) 10 MG capsule Take 1 capsule (10 mg total) by mouth 3 (three) times daily. 03/09/12  Yes [name], MD  esomeprazole (NEXIUM) 40 MG capsule Take 40 mg by mouth every evening.   Yes Historical Provider, MD  Fluticasone-Salmeterol (ADVAIR) 250-50 MCG/DOSE AEPB Inhale 1 puff into the lungs every 12 (twelve) hours. 02/07/12  Yes [name], MD  folic acid (FOLVITE) 1 MG tablet Take 1 mg by mouth every evening.    Yes Historical Provider, MD  furosemide (LASIX) 20 MG tablet Take 20 mg by mouth daily.  03/09/12  Yes [name], MD  insulin glargine (LANTUS) 100 UNIT/ML injection Inject 50 Units into the skin at bedtime.    Yes Historical Provider, MD  insulin lispro (HUMALOG) 100 UNIT/ML injection Inject 20-25 Units into the skin See admin instructions. 20 units at breakfast and 25 units at dinner   Yes Historical Provider, MD  metFORMIN (GLUCOPHAGE) 1000 MG tablet Take 1,000 mg by mouth 2 (two) times daily with a meal.   Yes Historical Provider, MD  metoCLOPramide (REGLAN) 10 MG tablet Take 10 mg by mouth 3 (three) times daily. 04/22/12  Yes [name], MD  Oxycodone HCl 20 MG TABS Take 20 mg by mouth every 3 (three) hours as needed (pain). 09/21/12  Yes [name], MD  potassium chloride (MICRO-K) 10 MEQ CR capsule Take 1 capsule by mouth daily. 12/24/11  Yes Historical Provider, MD  promethazine (PHENERGAN) 25 MG tablet Take 25 mg by mouth every 6 (six) hours as needed for nausea. For nausea 06/29/12  Yes [name], MD  rifaximin (XIFAXAN) 550 MG TABS tablet Take 550 mg by mouth 2 (two) times daily. 06/29/12  Yes [name], MD  tolterodine (DETROL) 2 MG tablet Take 2 mg by  mouth daily. 03/09/12  Yes [name], MD  traZODone (DESYREL) 100 MG tablet Take 2 tablets (200 mg total) by mouth at bedtime. 03/09/12  Yes [name], MD   Physical Exam: Filed Vitals:   11/14/12 1630  BP: 149/59  Pulse: 73  Temp:   Resp: 16    BP 149/59  Pulse 73  Temp(Src) 98.4 F (36.9 C) (Oral)  Resp 16  SpO2 99%  General Appearance:    Alert, cooperative, no distress, appears stated age  Head:    Normocephalic, without obvious abnormality, atraumatic           Throat:   Lips, mucosa, and tongue  dry  Neck:   Supple, symmetrical, trachea midline, no adenopathy;    thyroid:  no JVD     Lungs:     Clear to auscultation bilaterally, respirations unlabored      Heart:    Regular rate and rhythm, S1 and S2 normal, no murmur, rub   or gallop     Abdomen:     Soft, non-tender, bowel sounds active all four quadrants,    no masses, no organomegaly, colostomy bag         Extremities:   Extremities normal, atraumatic, no cyanosis or edema  Pulses:   2+ and symmetric all extremities        Neurologic:   CNII-XII intact, normal strength, sensation and reflexes    throughout     Labs on Admission:  Basic Metabolic Panel:  Recent Labs Lab 11/13/12 1334 11/14/12 1515  NA 134* 136  K 4.9 4.4  CL 99 97  CO2 22 25  GLUCOSE 481* 421*  BUN 25* 23  CREATININE 1.05 1.13*  CALCIUM 9.6 10.3   Liver Function Tests:  Recent Labs Lab 11/14/12 1515  AST 47*  ALT 45*  ALKPHOS 77  BILITOT 0.4  PROT 7.7  ALBUMIN 4.1    Recent Labs Lab 11/14/12 1515  LIPASE 76*   No results found for this basename: AMMONIA,  in the last 168 hours CBC:  Recent Labs Lab 11/13/12 1334 11/14/12 1515  WBC 2.4* 3.3*  HGB 10.1* 11.5*  HCT 30.1* 34.2*  MCV 87.5 87.7  PLT 39* 48*   Cardiac Enzymes: No results found for this basename: CKTOTAL, CKMB, CKMBINDEX, TROPONINI,  in the last 168 hours  BNP (last 3 results)  Recent Labs  11/13/12 1334  PROBNP 67.0    CBG:  Recent Labs Lab 11/13/12 1342 11/13/12 1449 11/13/12 1607 11/13/12 1710 11/14/12 1520  GLUCAP 466* 364* 305* 218* 401*    Radiological Exams on Admission: Dg Chest 2 View  11/13/2012   CLINICAL DATA:  Chest pain. Shortness of breath.  EXAM: CHEST  2 VIEW  COMPARISON:  Chest radiograph 11/06/2012.  FINDINGS: Stable enlarged cardiac and mediastinal contours. Low lung volumes. No large consolidative pulmonary opacity. No pleural effusion or pneumothorax. Regional skeleton is unremarkable. Cholecystectomy clips.  IMPRESSION: No acute cardiopulmonary process.   Electronically Signed   By: Annia Belt M.D.   On: 11/13/2012 14:33    EKG: Independently reviewed. Normal sinus rhythm normal axis no T wave abnormalities  Assessment/Plan HHNC (hyperglycemic hyperosmolar nonketotic coma)/  DIABETES MELLITUS, TYPE II: -  IV insulin and IV fluids, recheck B-met every 4 hours CBG every hour.  - She has no signs of ischemia, no new medications, no signs of infections no cough no shortness of breath UA yesterday was clean.  - Start her on Lantus 50 units twice a day plus sliding scale and titrate as tolerated.  HYPERTENSION - I will hold her furosemide as she has a little bit of acute kidney injury start her on IV fluids, use hydralazine when necessary for blood pressure greater than 180. Initiatedblood pressure medications in the morning once her creatinine has improved.  Acute kidney injury: - This probably secondary to decreased intravascular volume start on IV fluids check a basic metabolic panel in the morning. Check strict I.'s and O.'s.  Chronic respiratory failure - Stable cont O2  Neutropenia/Thrombocytopenia: - At baseline monitor, afebrile.   Code Status: full Family Communication: none  Disposition Plan: inpatinet  Time spent: 80 minutes  Marinda Elk [facility] Pager [phone]  If 7PM-7AM, please contact night-coverage [email or web address] Password  [email or web address] 11/14/2012, 5:00 PM

## 2012-11-14 NOTE — ED Notes (Signed)
Md Walden at bedside.  

## 2012-11-14 NOTE — ED Provider Notes (Signed)
CSN: [id number]     Arrival date & time 11/14/12  1458 History   First MD Initiated Contact with Patient 11/14/12 1459     Chief Complaint  Patient presents with  . Hyperglycemia  . Abdominal Pain   (Consider location/radiation/quality/duration/timing/severity/associated sxs/prior Treatment) Patient is a 62 y.o. female presenting with hyperglycemia and abdominal pain. The history is provided by the patient.  Hyperglycemia Blood sugar level PTA:  300-560 Severity:  Severe Onset quality:  Gradual Timing:  Constant Progression:  Worsening Chronicity:  New Diabetes status:  Controlled with insulin and controlled with diet Current diabetic therapy:  Insulin, meds Context: change in medication (increased lantus yesterday at EDP recommendation)   Relieved by:  Nothing Ineffective treatments:  None tried Associated symptoms: abdominal pain (chronic) and nausea   Associated symptoms: no fever, no shortness of breath and no vomiting   Abdominal Pain Associated symptoms: nausea   Associated symptoms: no cough, no diarrhea, no fever, no shortness of breath and no vomiting     Past Medical History  Diagnosis Date  . Cirrhosis   . Diabetes mellitus   . GERD (gastroesophageal reflux disease)   . Cervical disc syndrome   . Lung cancer     squamous cell  . Chronic respiratory failure   . Diverticulitis of colon   . Perforation of colon   . Arthritis   . IBS (irritable bowel syndrome)   . Chronic lower GI bleeding   . Overactive bladder   . Cervical cancer   . Thrombocytopenia     sees Dr. Arbutus Ped   . Splenomegaly   . Anxiety   . Depression   . Hypertension   . Asthma   . Chronic headache disorder   . Blood transfusion without reported diagnosis   . Heart murmur   . Hyperlipidemia   . Lung cancer   . COPD (chronic obstructive pulmonary disease)     sees Dr. Shelle Iron   . Anemia     sees Dr. Arbutus Ped, due to chronic disease and GI losses    Past Surgical History  Procedure  Laterality Date  . Appendectomy    . Cholecystectomy    . Colostomy    . Pneumonectomy    . Cervix removed    . Bowel resection    . Esophagogastroduodenoscopy  03/19/2011    Procedure: ESOPHAGOGASTRODUODENOSCOPY (EGD);  Surgeon: Louis Meckel, MD;  Location: [facility];  Service: Endoscopy;  Laterality: N/A;  . Colonoscopy  03/19/2011    Procedure: COLONOSCOPY;  Surgeon: Louis Meckel, MD;  Location: [facility];  Service: Endoscopy;  Laterality: N/A;  . Givens capsule study  03/20/2011    Procedure: GIVENS CAPSULE STUDY;  Surgeon: Louis Meckel, MD;  Location: [facility];  Service: Endoscopy;  Laterality: N/A;  . Small bowel obstruction repair  March 2012  . Umbilical hernia repair  March 2012   Family History  Problem Relation Age of Onset  . Coronary artery disease    . Diabetes type II    . Anesthesia problems Neg Hx   . Hypotension Neg Hx   . Malignant hyperthermia Neg Hx   . Pseudochol deficiency Neg Hx   . Heart attack Mother   . Diabetes type II Mother   . Cirrhosis Father    History  Substance Use Topics  . Smoking status: Former Smoker -- 2.00 packs/day for 35 years    Types: Cigarettes    Quit date: 03/24/2002  . Smokeless tobacco: Never Used  .  Alcohol Use: No   OB History   Grav Para Term Preterm Abortions TAB SAB Ect Mult Living                 Review of Systems  Constitutional: Negative for fever.  Respiratory: Negative for cough and shortness of breath.   Gastrointestinal: Positive for nausea and abdominal pain (chronic). Negative for vomiting and diarrhea.  All other systems reviewed and are negative.    Allergies  Codeine phosphate; Hydrocodone-acetaminophen; Morphine; Penicillins; and Cephalexin  Home Medications   Current Outpatient Rx  Name  Route  Sig  Dispense  Refill  . albuterol-ipratropium (COMBIVENT) 18-103 MCG/ACT inhaler   Inhalation   Inhale 2 puffs into the lungs every 4 (four) hours as needed for wheezing or shortness  of breath. For asthma          . cholecalciferol (VITAMIN D) 1000 UNITS tablet   Oral   Take 1,000 Units by mouth daily.          . diazepam (VALIUM) 5 MG tablet   Oral   Take 1 tablet (5 mg total) by mouth 3 (three) times daily. For anxiety   90 tablet   5   . dicyclomine (BENTYL) 10 MG capsule   Oral   Take 1 capsule (10 mg total) by mouth 3 (three) times daily.   90 capsule   11   . esomeprazole (NEXIUM) 40 MG capsule   Oral   Take 40 mg by mouth every evening.         . Fluticasone-Salmeterol (ADVAIR) 250-50 MCG/DOSE AEPB   Inhalation   Inhale 1 puff into the lungs every 12 (twelve) hours.   60 each   11   . folic acid (FOLVITE) 1 MG tablet   Oral   Take 1 mg by mouth every evening.          . furosemide (LASIX) 20 MG tablet   Oral   Take 20 mg by mouth daily.          . insulin glargine (LANTUS) 100 UNIT/ML injection   Subcutaneous   Inject 50 Units into the skin at bedtime.          . insulin lispro (HUMALOG) 100 UNIT/ML injection   Subcutaneous   Inject 20-25 Units into the skin See admin instructions. 20 units at breakfast and 25 units at dinner         . metFORMIN (GLUCOPHAGE) 1000 MG tablet   Oral   Take 1,000 mg by mouth 2 (two) times daily with a meal.         . metoCLOPramide (REGLAN) 10 MG tablet   Oral   Take 10 mg by mouth 3 (three) times daily.         . Oxycodone HCl 20 MG TABS   Oral   Take 20 mg by mouth every 3 (three) hours as needed (pain).         . potassium chloride (MICRO-K) 10 MEQ CR capsule   Oral   Take 1 capsule by mouth daily.         . promethazine (PHENERGAN) 25 MG tablet   Oral   Take 25 mg by mouth every 6 (six) hours as needed for nausea. For nausea         . rifaximin (XIFAXAN) 550 MG TABS tablet   Oral   Take 550 mg by mouth 2 (two) times daily.         [name] tolterodine (DETROL)  2 MG tablet   Oral   Take 2 mg by mouth daily.         . traZODone (DESYREL) 100 MG tablet   Oral    Take 2 tablets (200 mg total) by mouth at bedtime.   60 tablet   11    BP 149/59  Pulse 73  Temp(Src) 98.4 F (36.9 C) (Oral)  Resp 16  SpO2 99% Physical Exam  Nursing note and vitals reviewed. Constitutional: She is oriented to person, place, and time. She appears well-developed and well-nourished. No distress.  HENT:  Head: Normocephalic and atraumatic.  Eyes: EOM are normal. Pupils are equal, round, and reactive to light.  Neck: Normal range of motion. Neck supple.  Cardiovascular: Normal rate and regular rhythm.  Exam reveals no friction rub.   No murmur heard. Pulmonary/Chest: Effort normal and breath sounds normal. No respiratory distress. She has no wheezes. She has no rales.  Abdominal: Soft. She exhibits distension (upper abdomen). There is tenderness (diffuse upper abdomen). There is no rebound and no guarding.  Musculoskeletal: Normal range of motion. She exhibits no edema.  Neurological: She is alert and oriented to person, place, and time.  Skin: She is not diaphoretic.    ED Course  Procedures (including critical care time) Labs Review Labs Reviewed  CBC - Abnormal; Notable for the following:    WBC 3.3 (*)    Hemoglobin 11.5 (*)    HCT 34.2 (*)    RDW 19.5 (*)    Platelets 48 (*)    All other components within normal limits  BASIC METABOLIC PANEL - Abnormal; Notable for the following:    Glucose, Bld 421 (*)    Creatinine, Ser 1.13 (*)    GFR calc non Af Amer 52 (*)    GFR calc Af Amer 60 (*)    All other components within normal limits  HEPATIC FUNCTION PANEL - Abnormal; Notable for the following:    AST 47 (*)    ALT 45 (*)    All other components within normal limits  LIPASE, BLOOD - Abnormal; Notable for the following:    Lipase 76 (*)    All other components within normal limits  GLUCOSE, CAPILLARY - Abnormal; Notable for the following:    Glucose-Capillary 401 (*)    All other components within normal limits  POCT I-STAT TROPONIN I    Imaging Review Dg Chest 2 View  11/13/2012   CLINICAL DATA:  Chest pain. Shortness of breath.  EXAM: CHEST  2 VIEW  COMPARISON:  Chest radiograph 11/06/2012.  FINDINGS: Stable enlarged cardiac and mediastinal contours. Low lung volumes. No large consolidative pulmonary opacity. No pleural effusion or pneumothorax. Regional skeleton is unremarkable. Cholecystectomy clips.  IMPRESSION: No acute cardiopulmonary process.   Electronically Signed   By: Annia Belt M.D.   On: 11/13/2012 14:33    EKG Interpretation     Ventricular Rate:  74 PR Interval:  151 QRS Duration: 97 QT Interval:  425 QTC Calculation: 472 R Axis:   -17 Text Interpretation:  Sinus rhythm            MDM   1. Hyperglycemia   2. Abdominal  pain, other specified site    62 year old female with history of end-stage liver disease and recent diagnosis of diabetes for the past 3 weeks presents with hyperglycemia. She's been to the ER multiple times and was last seen yesterday and was instructed to increase her Lantus. She is not found to be  in DKA yesterday. Despite increasing her insulin, she still had elevated sugars as high as a 560s. Patient is having difficulty getting her blood sugars under control at home with her insulin therapy. Patient's ulcers of the abdominal pain. She has chronic abdominal pain, source normal. She with her cirrhosis she's never had any ascites for paracentesis. Today her vitals are stable. She has some upper abdominal pain without ascites. She denies any vomiting, diarrhea, but is having some nausea. We'll check some labs and see if we see new patient to help with her blood sugars. Labs show no anion gap. Hyperglycemic here. I spoke with Medicine who will admit to help patient get on a blood sugar management regimen.    Dagmar Hait, MD 11/14/12 [phone]

## 2012-11-14 NOTE — ED Notes (Signed)
Per EMS: Pt from home with c/o increased chronic abdominal pain and hyperglycemia. Seen here yesterday for same. NAD. BG 423. 140/60. 80 SR. 16 RR. 98% 2L.

## 2012-11-14 NOTE — Progress Notes (Signed)
11/14/12 Patient coming from ED to Room 5 w10 with DX of Hyperglycemia CBG on admit to ED was 400, IV site Rt Distal Forearm with NS at 75/hr. Diet carb mod, DVT  Heparin injections.

## 2012-11-15 ENCOUNTER — Encounter (HOSPITAL_COMMUNITY): Payer: Self-pay | Admitting: *Deleted

## 2012-11-15 DIAGNOSIS — N179 Acute kidney failure, unspecified: Secondary | ICD-10-CM

## 2012-11-15 DIAGNOSIS — E1101 Type 2 diabetes mellitus with hyperosmolarity with coma: Principal | ICD-10-CM

## 2012-11-15 DIAGNOSIS — I10 Essential (primary) hypertension: Secondary | ICD-10-CM

## 2012-11-15 DIAGNOSIS — E119 Type 2 diabetes mellitus without complications: Secondary | ICD-10-CM

## 2012-11-15 DIAGNOSIS — R7309 Other abnormal glucose: Secondary | ICD-10-CM

## 2012-11-15 LAB — BASIC METABOLIC PANEL
BUN: 19 mg/dL (ref 6–23)
BUN: 19 mg/dL (ref 6–23)
CO2: 24 mEq/L (ref 19–32)
CO2: 24 mEq/L (ref 19–32)
CO2: 24 mEq/L (ref 19–32)
Calcium: 9.1 mg/dL (ref 8.4–10.5)
Calcium: 8.9 mg/dL (ref 8.4–10.5)
Calcium: 8.9 mg/dL (ref 8.4–10.5)
Chloride: 102 mEq/L (ref 96–112)
Creatinine, Ser: 0.99 mg/dL (ref 0.50–1.10)
Creatinine, Ser: 0.99 mg/dL (ref 0.50–1.10)
GFR calc Af Amer: 70 mL/min — ABNORMAL LOW (ref >90)
GFR calc Af Amer: 70 mL/min — ABNORMAL LOW (ref >90)
GFR calc non Af Amer: 61 mL/min — ABNORMAL LOW (ref >90)
GFR calc non Af Amer: 60 mL/min — ABNORMAL LOW (ref >90)
Glucose, Bld: 243 mg/dL — ABNORMAL HIGH (ref 70–99)
Glucose, Bld: 206 mg/dL — ABNORMAL HIGH (ref 70–99)
Potassium: 3.8 mEq/L (ref 3.5–5.1)
Sodium: 137 mEq/L (ref 135–145)

## 2012-11-15 LAB — PROTIME-INR
INR: 1.13 (ref 0.00–1.49)
Prothrombin Time: 14.3 seconds (ref 11.6–15.2)

## 2012-11-15 LAB — GLUCOSE, CAPILLARY
Glucose-Capillary: 165 mg/dL — ABNORMAL HIGH (ref 70–99)
Glucose-Capillary: 249 mg/dL — ABNORMAL HIGH (ref 70–99)
Glucose-Capillary: 344 mg/dL — ABNORMAL HIGH (ref 70–99)

## 2012-11-15 LAB — CBC
HCT: 29.3 % — ABNORMAL LOW (ref 36.0–46.0)
MCHC: 32.8 g/dL (ref 30.0–36.0)
Platelets: 38 10*3/uL — ABNORMAL LOW (ref 150–400)
RDW: 19.7 % — ABNORMAL HIGH (ref 11.5–15.5)
WBC: 1.9 10*3/uL — ABNORMAL LOW (ref 4.0–10.5)

## 2012-11-15 NOTE — Evaluation (Signed)
Physical Therapy Evaluation Patient Details Name: [name] MRN: [id number] DOB: [birth year] Today's Date: 11/15/2012 Time: 0865-7846 PT Time Calculation (min): 41 min  PT Assessment / Plan / Recommendation History of Present Illness  Patient is a 62 yo female with history of hypertension and diabetes, non-small cell lung cancer status post left upper lobectomy on chemotherapy, comes in for hyperglycemia. She has been to the ED 3 times as her blood glucose are hard to control.   Clinical Impression  Patient presents with problems listed below.  Will benefit from acute PT to maximize independence prior to discharge home with family.    PT Assessment  Patient needs continued PT services    Follow Up Recommendations  Home health PT;Supervision/Assistance - 24 hour    Does the patient have the potential to tolerate intense rehabilitation      Barriers to Discharge        Equipment Recommendations  3in1 (PT)    Recommendations for Other Services     Frequency Min 3X/week    Precautions / Restrictions Precautions Precautions: Fall Restrictions Weight Bearing Restrictions: No   Pertinent Vitals/Pain       Mobility  Bed Mobility Bed Mobility: Supine to Sit;Sit to Supine Supine to Sit: 5: Supervision;HOB elevated Sit to Supine: 5: Supervision;HOB elevated Details for Bed Mobility Assistance: Supervision for safety only. Transfers Transfers: Sit to Stand;Stand to Sit Sit to Stand: 4: Min assist;With upper extremity assist;From bed Stand to Sit: 4: Min guard;To bed Details for Transfer Assistance: Verbal cues for hand placement.  Assist for balance/safety when rising to standing. Ambulation/Gait Ambulation/Gait Assistance: 4: Min guard Ambulation Distance (Feet): 76 Feet Assistive device: Rolling walker Ambulation/Gait Assistance Details: Verbal cues to stand upright during gait.  Reviewed safe use of RW.  Patient stood at sink to brush hair and brush teeth.   Reviewed safe technique for RW at sink.  Patient fatigued quickly with ambulation - with O2 in place. Gait Pattern: Step-through pattern;Decreased stride length;Trunk flexed Gait velocity: Slow gait speed General Gait Details: Patient required 2 standing rest breaks due to dyspnea.    Exercises     PT Diagnosis: Difficulty walking;Abnormality of gait;Generalized weakness;Acute pain  PT Problem List: Decreased strength;Decreased activity tolerance;Decreased balance;Decreased mobility;Cardiopulmonary status limiting activity;Pain PT Treatment Interventions: DME instruction;Gait training;Stair training;Functional mobility training;Patient/family education     PT Goals(Current goals can be found in the care plan section) Acute Rehab PT Goals Patient Stated Goal: To be able to return home soon PT Goal Formulation: With patient Time For Goal Achievement: 11/22/12 Potential to Achieve Goals: Good  Visit Information  Last PT Received On: 11/15/12 Assistance Needed: +1 History of Present Illness: Patient is a 62 yo female with history of hypertension and diabetes, non-small cell lung cancer status post left upper lobectomy on chemotherapy, comes in for hyperglycemia. She has been to the ED 3 times as her blood glucose are hard to control.        Prior Functioning  Home Living Family/patient expects to be discharged to:: Private residence Living Arrangements: Children Available Help at Discharge: Family;Available 24 hours/day Type of Home: House Home Access: Stairs to enter [employer] of Steps: 3 Entrance Stairs-Rails: None Home Layout: One level Home Equipment: Cane - single point;Walker - 2 wheels Prior Function Level of Independence: Independent with assistive device(s);Needs assistance ADL's / Homemaking Assistance Needed: Meal prep, housekeeping Communication Communication: No difficulties Dominant Hand: Right    Cognition  Cognition Arousal/Alertness:  Awake/alert Behavior During Therapy: WFL for  tasks assessed/performed Overall Cognitive Status: Within Functional Limits for tasks assessed    Extremity/Trunk Assessment Upper Extremity Assessment Upper Extremity Assessment: Overall WFL for tasks assessed Lower Extremity Assessment Lower Extremity Assessment: Generalized weakness   Balance Balance Balance Assessed: Yes Static Standing Balance Static Standing - Balance Support: Left upper extremity supported Static Standing - Level of Assistance: 5: Stand by assistance Static Standing - Comment/# of Minutes: 6 minutes performing ADL's.  Reviewed keeping 1 hand on RW or sink for balance.  End of Session PT - End of Session Equipment Utilized During Treatment: Gait belt;Oxygen Activity Tolerance: Patient limited by fatigue Patient left: in bed;with call bell/phone within reach Nurse Communication: Mobility status  GP     Vena Austria 11/15/2012, 4:28 PM Durenda Hurt. Renaldo Fiddler, [facility] Acute Rehab Services Pager [phone]

## 2012-11-15 NOTE — Progress Notes (Signed)
[facility] PROGRESS NOTE  [name] ZOX:[id number] DOB: [birth year] DOA: 11/14/2012 PCP: [name], MD  HPI/Subjective: Feels much better, denies any specific complaints.  Assessment/Plan: Active Problems:   DIABETES MELLITUS, TYPE II   HYPERTENSION   Chronic respiratory failure   Thrombocytopenia   HHNC (hyperglycemic hyperosmolar nonketotic coma)   AKI (acute kidney injury)   1. Hyperosmolar hyperglycemic state: Patient admitted to the hospital with blood sugar of 421, the patient did have some signs of dehydration. Patient does have stage IV liver disease. Initially she was started on insulin drip, and so that was switched to subcutaneous insulin. Patient did have some recent changes in her medication and her insulin adjusted. Lantus increased to 15 units twice a day, patient is on Humalog with meals.  2. Acute kidney injury: Patient had very mild creatinine elevation likely secondary to dehydration. Patient had with effect of furosemide and osmotic diuresis from the persistent hypoglycemia. Lasix withheld and IV fluids were started, her creatinine back to normal.  3. Hypertension: Furosemide held, will restart her home medications.  4. Chronic respiratory failure: Stable, patient is on home oxygen continued.  5. Thrombocytopenia: Pancytopenia is likely secondary to chronic liver disease, hypersplenism and thromboplastin deficiency and chronic liver disease. Check INR.  Code Status: Full code Family Communication: Plan discussed with the patient. Disposition Plan: Remains inpatient   Consultants:  None  Procedures:  None  Antibiotics:  None   Objective: Filed Vitals:   11/15/12 0625  BP: 127/72  Pulse: 36  Temp: 98.3 F (36.8 C)  Resp: 18    Intake/Output Summary (Last 24 hours) at 11/15/12 1014 Last data filed at 11/15/12 0715  Gross per 24 hour  Intake   1395 ml  Output      0 ml  Net   1395 ml   Filed Weights   11/14/12 1759  11/15/12 0625  Weight: 81.647 kg (180 lb) 83.19 kg (183 lb 6.4 oz)    Exam: General: Alert and awake, oriented x3, not in any acute distress. HEENT: anicteric sclera, pupils reactive to light and accommodation, EOMI CVS: S1-S2 clear, no murmur rubs or gallops Chest: clear to auscultation bilaterally, no wheezing, rales or rhonchi Abdomen: soft nontender, nondistended, normal bowel sounds, no organomegaly Extremities: no cyanosis, clubbing or edema noted bilaterally Neuro: Cranial nerves II-XII intact, no focal neurological deficits  Data Reviewed: Basic Metabolic Panel:  Recent Labs Lab 11/13/12 1334 11/14/12 1515 11/14/12 2319 11/15/12 0150 11/15/12 0435  NA 134* 136 138 137 140  K 4.9 4.4 3.8 3.7 4.1  CL 99 97 102 101 105  CO2 22 25 24 24 24   GLUCOSE 481* 421* 207* 243* 206*  BUN 25* 23 19 20 19   CREATININE 1.05 1.13* 0.99 0.99 1.00  CALCIUM 9.6 10.3 9.1 8.9 8.9   Liver Function Tests:  Recent Labs Lab 11/14/12 1515  AST 47*  ALT 45*  ALKPHOS 77  BILITOT 0.4  PROT 7.7  ALBUMIN 4.1    Recent Labs Lab 11/14/12 1515  LIPASE 76*   No results found for this basename: AMMONIA,  in the last 168 hours CBC:  Recent Labs Lab 11/13/12 1334 11/14/12 1515 11/15/12 0435  WBC 2.4* 3.3* 1.9*  HGB 10.1* 11.5* 9.6*  HCT 30.1* 34.2* 29.3*  MCV 87.5 87.7 88.8  PLT 39* 48* 38*   Cardiac Enzymes: No results found for this basename: CKTOTAL, CKMB, CKMBINDEX, TROPONINI,  in the last 168 hours BNP (last 3 results)  Recent Labs  11/13/12  1334  PROBNP 67.0   CBG:  Recent Labs Lab 11/14/12 1807 11/14/12 1938 11/14/12 2223 11/15/12 0001 11/15/12 0734  GLUCAP 182* 187* 209* 226* 165*    Micro Recent Results (from the past 240 hour(s))  URINE CULTURE     Status: None   Collection Time    11/06/12  3:43 PM      Result Value Range Status   Specimen Description URINE, RANDOM   Final   Special Requests NONE   Final   Culture  Setup Time     Final   Value:  11/06/2012 21:15     Performed at [facility] Count     Final   Value: NO GROWTH     Performed at [facility]   Culture     Final   Value: NO GROWTH     Performed at [facility]   Report Status 11/07/2012 FINAL   Final  MRSA PCR SCREENING     Status: None   Collection Time    11/14/12  8:38 PM      Result Value Range Status   MRSA by PCR NEGATIVE  NEGATIVE Final   Comment:            The GeneXpert MRSA Assay (FDA     approved for NASAL specimens     only), is one component of a     comprehensive MRSA colonization     surveillance program. It is not     intended to diagnose MRSA     infection nor to guide or     monitor treatment for     MRSA infections.     Studies: Dg Chest 2 View  11/13/2012   CLINICAL DATA:  Chest pain. Shortness of breath.  EXAM: CHEST  2 VIEW  COMPARISON:  Chest radiograph 11/06/2012.  FINDINGS: Stable enlarged cardiac and mediastinal contours. Low lung volumes. No large consolidative pulmonary opacity. No pleural effusion or pneumothorax. Regional skeleton is unremarkable. Cholecystectomy clips.  IMPRESSION: No acute cardiopulmonary process.   Electronically Signed   By: Annia Belt M.D.   On: 11/13/2012 14:33    Scheduled Meds: . diazepam  5 mg Oral TID  . heparin  5,000 Units Subcutaneous Q8H  . insulin aspart  0-5 Units Subcutaneous QHS  . insulin aspart  0-9 Units Subcutaneous TID WC  . insulin glargine  50 Units Subcutaneous BID  . metoCLOPramide  10 mg Oral TID  . mometasone-formoterol  2 puff Inhalation BID  . oxybutynin  5 mg Oral QHS  . pantoprazole  40 mg Oral Daily  . rifaximin  550 mg Oral BID  . traZODone  200 mg Oral QHS   Continuous Infusions: . sodium chloride 75 mL/hr at 11/15/12 0803  . dextrose 5 % and 0.45% NaCl         Time spent: 35 minutes    [facility]  [facility] Pager [phone] If 7PM-7AM, please contact night-coverage at [email or web address], password [facility] 11/15/2012,  10:14 AM  LOS: 1 day

## 2012-11-16 LAB — BASIC METABOLIC PANEL
CO2: 22 mEq/L (ref 19–32)
Calcium: 9.8 mg/dL (ref 8.4–10.5)
Creatinine, Ser: 0.96 mg/dL (ref 0.50–1.10)
GFR calc Af Amer: 73 mL/min — ABNORMAL LOW (ref >90)
Sodium: 137 mEq/L (ref 135–145)

## 2012-11-16 LAB — CBC
HCT: 33.6 % — ABNORMAL LOW (ref 36.0–46.0)
MCHC: 32.4 g/dL (ref 30.0–36.0)
MCV: 90.1 fL (ref 78.0–100.0)
Platelets: 40 10*3/uL — ABNORMAL LOW (ref 150–400)
RDW: 19.7 % — ABNORMAL HIGH (ref 11.5–15.5)
WBC: 2.3 10*3/uL — ABNORMAL LOW (ref 4.0–10.5)

## 2012-11-16 LAB — GLUCOSE, CAPILLARY
Glucose-Capillary: 206 mg/dL — ABNORMAL HIGH (ref 70–99)
Glucose-Capillary: 245 mg/dL — ABNORMAL HIGH (ref 70–99)

## 2012-11-16 MED ORDER — INSULIN LISPRO 100 UNIT/ML ~~LOC~~ SOLN: 20.0000 [IU] | Freq: Three times a day (TID) | SUBCUTANEOUS | Status: DC

## 2012-11-16 MED ORDER — INSULIN GLARGINE 100 UNIT/ML ~~LOC~~ SOLN: 50.0000 [IU] | Freq: Two times a day (BID) | SUBCUTANEOUS | Status: DC

## 2012-11-16 NOTE — Progress Notes (Signed)
11/16/12 Patient going home today. IV site removed, discharge instructions reviewed with patient.

## 2012-11-16 NOTE — Progress Notes (Signed)
Physical Therapy Treatment Patient Details Name: [name] MRN: [id number] DOB: 01/11/1951 Today's Date: 11/16/2012 Time: 8657-8469 PT Time Calculation (min): 25 min  PT Assessment / Plan / Recommendation  History of Present Illness Patient is a 62 yo female with history of hypertension and diabetes, non-small cell lung cancer status post left upper lobectomy on chemotherapy, comes in for hyperglycemia. She has been to the ED 3 times as her blood glucose are hard to control.    PT Comments   Overall managing well, much imporved activity tolerance and amb distance over yesterday's eval; Pt is confident in her ability to manage at home, and reports she has plenty of assist; OK for dc home from PT standpoint; Educated pt on importance of PCP follow-up and inspecting feet every day   Follow Up Recommendations  No PT follow up;Other (comment) (pt politely declining HHPT; )     Does the patient have the potential to tolerate intense rehabilitation     Barriers to Discharge        Equipment Recommendations  3in1 (PT)    Recommendations for Other Services    Frequency Min 3X/week   Progress towards PT Goals Progress towards PT goals: Progressing toward goals  Plan Current plan remains appropriate    Precautions / Restrictions Precautions Precautions: Fall Restrictions Weight Bearing Restrictions: No   Pertinent Vitals/Pain O2 sats remained greater than or equal to 92% throughout walk on Room air; towards the end, pt became dyspneic, so restarted O2    Mobility  Bed Mobility Bed Mobility: Not assessed (OOB upon arrival) Transfers Transfers: Sit to Stand;Stand to Sit Sit to Stand: 5: Supervision;From chair/3-in-1 Stand to Sit: 5: Supervision;To chair/3-in-1 Ambulation/Gait Ambulation/Gait Assistance: 5: Supervision Ambulation Distance (Feet): 125 Feet Assistive device: Rolling walker Ambulation/Gait Assistance Details: Cues for RW and education on using RW for energy  conservation provided; showed good carryover from yesterday's PT session and OT today; Cues to self-monitor; Amb initiated on Room Air, and restarted O2 towards end of walk secondary to moderate dyspnea Gait Pattern: Step-through pattern;Decreased stride length;Trunk flexed Gait velocity: Slow gait speed General Gait Details: Did not require stnading rest breaks today Stairs: No (Pt is confident in her ability to manage steps with daughter)    Exercises     PT Diagnosis:    PT Problem List:   PT Treatment Interventions:     PT Goals (current goals can now be found in the care plan section) Acute Rehab PT Goals Patient Stated Goal: To be able to return home soon PT Goal Formulation: With patient Time For Goal Achievement: 11/22/12 Potential to Achieve Goals: Good  Visit Information  Last PT Received On: 11/16/12 Assistance Needed: +1 History of Present Illness: Patient is a 62 yo female with history of hypertension and diabetes, non-small cell lung cancer status post left upper lobectomy on chemotherapy, comes in for hyperglycemia. She has been to the ED 3 times as her blood glucose are hard to control.     Subjective Data  Patient Stated Goal: To be able to return home soon   Cognition  Cognition Arousal/Alertness: Awake/alert Behavior During Therapy: WFL for tasks assessed/performed Overall Cognitive Status: Within Functional Limits for tasks assessed    Balance  Balance Balance Assessed: Yes Static Standing Balance Static Standing - Balance Support: Left upper extremity supported Static Standing - Level of Assistance: 5: Stand by assistance Static Standing - Comment/# of Minutes: 5 at sink  End of Session PT - End of Session Equipment  Utilized During Treatment: Gait belt;Oxygen Activity Tolerance: Patient tolerated treatment well Patient left: in chair;with nursing/sitter in room;Other (comment) (Nursing student giving insulin and setting pt up for lunch) Nurse  Communication: Mobility status   GP     Van Clines [facility] [place], [place] [phone]  11/16/2012, 1:20 PM

## 2012-11-16 NOTE — Care Management Note (Signed)
    Page 1 of 1   11/16/2012     5:53:38 PM   CARE MANAGEMENT NOTE 11/16/2012  Patient:  [name], [name]   Account Number:  [id number]  Date Initiated:  11/16/2012  Documentation initiated by:  Letha Cape  Subjective/Objective Assessment:   dx thrombocytopenia, es liver dz  admit- lives with family. has home oxygen with [facility].     Action/Plan:   pt eval- no pt needs. pt has someone with her at all times.   Anticipated DC Date:  11/16/2012   Anticipated DC Plan:  HOME/SELF CARE      DC Planning Services  CM consult      Choice offered to / List presented to:             Status of service:  Completed, signed off Medicare Important Message given?   (If response is "NO", the following Medicare IM given date fields will be blank) Date Medicare IM given:   Date Additional Medicare IM given:    Discharge Disposition:  HOME/SELF CARE  Per UR Regulation:  Reviewed for med. necessity/level of care/duration of stay  If discussed at Long Length of Stay Meetings, dates discussed:    Comments:  11/16/12 17:52 Letha Cape RN, BSN [phone] patient lives with family, has home oxygen. pateint states she does not need a 3 n 1.  Patient states she has someone with her at all times.  Per physical therapy she has no pt needs.  Patient for dc today.

## 2012-11-16 NOTE — Discharge Summary (Signed)
Physician Discharge Summary  [name] YNW:[id number] DOB: 19-Mar-1950 DOA: 11/14/2012  PCP: [name], MD  Admit date: 11/14/2012 Discharge date: 11/16/2012  Time spent: 40 minutes  Recommendations for Outpatient Follow-up:  1. Followup with primary care physician as scheduled.  Discharge Diagnoses:  Active Problems:   DIABETES MELLITUS, TYPE II   HYPERTENSION   Chronic respiratory failure   Thrombocytopenia   HHNC (hyperglycemic hyperosmolar nonketotic coma)   AKI (acute kidney injury)   Discharge Condition: Stable  Diet recommendation: Carbohydrate modified diet  Filed Weights   11/15/12 0625 11/15/12 2244 11/16/12 0647  Weight: 83.19 kg (183 lb 6.4 oz) 84.1 kg (185 lb 6.5 oz) 83.2 kg (183 lb 6.8 oz)    History of present illness:  [name] is a 62 y.o. female  Past medical history of hypertension and diabetes, chronic liver disease and history of NSCLC comes in for hyperglycemia. She has been to the ED 3 times as her blood glucose are hard to control. She relates no chest pain shortness of breath burning when she urinates no cough. She does relate some dizziness upon standing. She relates her sugars every time she gets home there was 500 and her blood glucoses not being controlled. She has no new medications and does not consume alcohol.   Hospital Course:   1. Hyperosmolar hyperglycemic state: Patient admitted to the hospital with blood sugar of 421, the patient did have some signs of dehydration. Patient does have stage IV liver disease. Initially she was started on insulin drip, and so that was switched to subcutaneous insulin. Patient did have some recent changes in her medication and her insulin adjusted. Lantus increased to 50 units twice a day and Humalog 20 units with meals. Patient is to continue her metformin. Hemoglobin A1c is 8.6%, this correlates with a mean plasma glucose of 200.  2. Uncontrolled diabetes mellitus type 2: Patient was recently  started on Lantus insulin, she was on 30 units at night, to do is was increased to 15 units at night and patient was still having high blood sugars. It was adjusted further to 50 twice a day during this hospital stay. Patient seems to have some type of insulin resistance as it does now for basal insulin is 100 units. Patient might benefit from endocrine evaluation, for now continue current regimen with 50 units of Lantus twice a day and 1 g of metformin twice a day.   2. Acute kidney injury: Patient had very mild creatinine elevation likely secondary to dehydration. Patient had with effect of furosemide and osmotic diuresis from the persistent hypoglycemia. Lasix withheld and IV fluids were started, her creatinine back to normal.   3. Hypertension: Furosemide held and patient did receive IV fluids of admission, at discharge furosemide restarted.   4. Chronic respiratory failure: Stable, patient is on 2 L of home oxygen continued.   5. 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 INR is 1.13   Procedures:  None  Consultations:  None  Discharge Exam: Filed Vitals:   11/16/12 0647  BP: 123/72  Pulse: 63  Temp: 98.1 F (36.7 C)  Resp: 18   General: Alert and awake, oriented x3, not in any acute distress. HEENT: anicteric sclera, pupils reactive to light and accommodation, EOMI CVS: S1-S2 clear, no murmur rubs or gallops Chest: clear to auscultation bilaterally, no wheezing, rales or rhonchi Abdomen: soft nontender, nondistended, normal bowel sounds, no organomegaly Extremities: no  cyanosis, clubbing or edema noted bilaterally Neuro: Cranial nerves II-XII intact, no focal neurological deficits  Discharge Instructions  Discharge Orders   Future Appointments Provider Department Dept Phone   11/23/2012 8:45 AM [name], MD [facility] [phone]    12/03/2012 8:45 AM Mauri [place] [facility] [phone]   12/03/2012 9:15 AM Si Gaul, MD [facility] [phone]   Future Orders Complete By Expires   Diet Carb Modified  As directed    Increase activity slowly  As directed        Medication List         albuterol-ipratropium 18-103 MCG/ACT inhaler  Commonly known as:  COMBIVENT  - Inhale 2 puffs into the lungs every 4 (four) hours as needed for wheezing or shortness of breath. For asthma  -      cholecalciferol 1000 UNITS tablet  Commonly known as:  VITAMIN D  Take 1,000 Units by mouth daily.     diazepam 5 MG tablet  Commonly known as:  VALIUM  Take 1 tablet (5 mg total) by mouth 3 (three) times daily. For anxiety     dicyclomine 10 MG capsule  Commonly known as:  BENTYL  Take 1 capsule (10 mg total) by mouth 3 (three) times daily.     esomeprazole 40 MG capsule  Commonly known as:  NEXIUM  Take 40 mg by mouth every evening.     Fluticasone-Salmeterol 250-50 MCG/DOSE Aepb  Commonly known as:  ADVAIR  Inhale 1 puff into the lungs every 12 (twelve) hours.     folic acid 1 MG tablet  Commonly known as:  FOLVITE  Take 1 mg by mouth every evening.     furosemide 20 MG tablet  Commonly known as:  LASIX  Take 20 mg by mouth daily.     insulin glargine 100 UNIT/ML injection  Commonly known as:  LANTUS  Inject 0.5 mLs (50 Units total) into the skin 2 (two) times daily.     insulin lispro 100 UNIT/ML injection  Commonly known as:  HUMALOG  Inject 20 Units into the skin 3 (three) times daily before meals. 20 units at breakfast and 25 units at dinner     metFORMIN 1000 MG tablet  Commonly known as:  GLUCOPHAGE  Take 1,000 mg by mouth 2 (two) times daily with a meal.     metoCLOPramide 10 MG tablet  Commonly known as:  REGLAN  Take 10 mg by mouth 3 (three) times daily.     Oxycodone HCl 20 MG Tabs  Take 20 mg by mouth every 3 (three) hours as  needed (pain).     potassium chloride 10 MEQ CR capsule  Commonly known as:  MICRO-K  Take 1 capsule by mouth daily.     promethazine 25 MG tablet  Commonly known as:  PHENERGAN  Take 25 mg by mouth every 6 (six) hours as needed for nausea. For nausea     rifaximin 550 MG Tabs tablet  Commonly known as:  XIFAXAN  Take 550 mg by mouth 2 (two) times daily.     tolterodine 2 MG tablet  Commonly known as:  DETROL  Take 2 mg by mouth daily.     traZODone 100 MG tablet  Commonly known as:  DESYREL  Take 2 tablets (200 mg total) by mouth at bedtime.       Allergies  Allergen Reactions  . Codeine Phosphate  Stomach cramps  . Hydrocodone-Acetaminophen     hallucinations  . Morphine     Lowers BP  . Penicillins     Rash, tongue swelling  . Cephalexin Swelling and Rash      The results of significant diagnostics from this hospitalization (including imaging, microbiology, ancillary and laboratory) are listed below for reference.    Significant Diagnostic Studies: Dg Chest 2 View  11/13/2012   CLINICAL DATA:  Chest pain. Shortness of breath.  EXAM: CHEST  2 VIEW  COMPARISON:  Chest radiograph 11/06/2012.  FINDINGS: Stable enlarged cardiac and mediastinal contours. Low lung volumes. No large consolidative pulmonary opacity. No pleural effusion or pneumothorax. Regional skeleton is unremarkable. Cholecystectomy clips.  IMPRESSION: No acute cardiopulmonary process.   Electronically Signed   By: Annia Belt M.D.   On: 11/13/2012 14:33   Ct Abdomen Pelvis W Contrast  11/06/2012   CLINICAL DATA:  Abdominal pain. 8/10 upper abdominal pain.  EXAM: CT ABDOMEN AND PELVIS WITH CONTRAST  TECHNIQUE: Multidetector CT imaging of the abdomen and pelvis was performed using the standard protocol following bolus administration of intravenous contrast.  CONTRAST:  OMNIPAQUE IOHEXOL 300 MG/ML  SOLN  COMPARISON:  03/17/2011.  FINDINGS: Lung Bases: Dependent atelectasis.  Liver: Nodular contour  of the liver compatible with hepatic cirrhosis. No focal mass lesions.  Spleen: Chronic massive splenomegaly with 21 cm splenic span. This is slightly larger than on the prior exam of 03/17/2011.  Gallbladder:  Surgically absent. Clips in the fossa.  Common bile duct:  Normal.  Pancreas:  Normal.  Adrenal glands:  Normal bilaterally.  Kidneys: Nonspecific bilateral perinephric stranding. Normal enhancement and delayed excretion of contrast. Both ureters appear within normal limits.  Stomach:  Normal. No inflammatory changes.  Small bowel: Probable omentectomy with small bowel apposed to the anterior abdominal wall. There is no obstruction. No dilated loops of small bowel. There is no mesenteric adenopathy. No free air.  Colon: Colonic fluid levels are present which are abnormal but nonspecific. There is no mural thickening. These fluid levels are commonly associated with enteric infection. Descending colostomy with small Hartmann's pouch. Parastomal hernia is present without complicating features.  Pelvic Genitourinary: Urinary bladder appears normal aside from cystocele. Hysterectomy. No free fluid in the pelvis.  Bones:  No aggressive osseous lesions.  Vasculature: Atherosclerosis without an acute vascular abnormality.  Body Wall: Scarring in the anterior abdominal wall. Laxity of the anterior abdominal wall. Tiny right paramedian subxiphoid fat containing ventral hernia.  IMPRESSION: 1. Colonic air-fluid levels most commonly associated with enteric infection but nonspecific. 2. Splenomegaly with 21 cm splenic span. Mild increase from prior. 3. Cholecystectomy and hysterectomy with cystocele. 4. Hepatic cirrhosis. 5. Descending colostomy with uncomplicated parastomal hernia.   Electronically Signed   By: Andreas Newport M.D.   On: 11/06/2012 16:36   Dg Chest Portable 1 View  11/06/2012   CLINICAL DATA:  Hyperglycemia.  EXAM: PORTABLE CHEST - 1 VIEW  COMPARISON:  Multiple priors  FINDINGS: The cardiomediastinal  silhouette is unchanged. There are low lung volumes. Mild left basilar opacity. No edema. No pleural effusion or pneumothorax. No acute osseous abnormality.  IMPRESSION: Mild left basilar opacity, likely atelectasis. Low lung volumes.   Electronically Signed   By: Jerene Dilling M.D.   On: 11/06/2012 15:32    Microbiology: Recent Results (from the past 240 hour(s))  URINE CULTURE     Status: None   Collection Time    11/06/12  3:43 PM  Result Value Range Status   Specimen Description URINE, RANDOM   Final   Special Requests NONE   Final   Culture  Setup Time     Final   Value: 11/06/2012 21:15     Performed at [facility]   Colony Count     Final   Value: NO GROWTH     Performed at [facility]   Culture     Final   Value: NO GROWTH     Performed at [facility]   Report Status 11/07/2012 FINAL   Final  MRSA PCR SCREENING     Status: None   Collection Time    11/14/12  8:38 PM      Result Value Range Status   MRSA by PCR NEGATIVE  NEGATIVE Final   Comment:            The GeneXpert MRSA Assay (FDA     approved for NASAL specimens     only), is one component of a     comprehensive MRSA colonization     surveillance program. It is not     intended to diagnose MRSA     infection nor to guide or     monitor treatment for     MRSA infections.     Labs: Basic Metabolic Panel:  Recent Labs Lab 11/14/12 1515 11/14/12 2319 11/15/12 0150 11/15/12 0435 11/16/12 0500  NA 136 138 137 140 137  K 4.4 3.8 3.7 4.1 4.4  CL 97 102 101 105 103  CO2 25 24 24 24 22   GLUCOSE 421* 207* 243* 206* 249*  BUN 23 19 20 19 18   CREATININE 1.13* 0.99 0.99 1.00 0.96  CALCIUM 10.3 9.1 8.9 8.9 9.8   Liver Function Tests:  Recent Labs Lab 11/14/12 1515  AST 47*  ALT 45*  ALKPHOS 77  BILITOT 0.4  PROT 7.7  ALBUMIN 4.1    Recent Labs Lab 11/14/12 1515  LIPASE 76*   No results found for this basename: AMMONIA,  in the last 168 hours CBC:  Recent  Labs Lab 11/13/12 1334 11/14/12 1515 11/15/12 0435 11/16/12 0500  WBC 2.4* 3.3* 1.9* 2.3*  HGB 10.1* 11.5* 9.6* 10.9*  HCT 30.1* 34.2* 29.3* 33.6*  MCV 87.5 87.7 88.8 90.1  PLT 39* 48* 38* 40*   Cardiac Enzymes: No results found for this basename: CKTOTAL, CKMB, CKMBINDEX, TROPONINI,  in the last 168 hours BNP: BNP (last 3 results)  Recent Labs  11/13/12 1334  PROBNP 67.0   CBG:  Recent Labs Lab 11/15/12 0734 11/15/12 1145 11/15/12 1632 11/15/12 2204 11/16/12 0737  GLUCAP 165* 249* 344* 326* 206*       Signed:  Fredrich Cory A  [facility] 11/16/2012, 10:51 AM

## 2012-11-16 NOTE — Evaluation (Signed)
Occupational Therapy Evaluation Patient Details Name: [name] MRN: [id number] DOB: Mar 13, 1950 Today's Date: 11/16/2012 Time: 7829-5621 OT Time Calculation (min): 28 min  OT Assessment / Plan / Recommendation History of present illness Patient is a 62 yo female with history of hypertension and diabetes, non-small cell lung cancer status post left upper lobectomy on chemotherapy, comes in for hyperglycemia. She has been to the ED 3 times as her blood glucose are hard to control.    Clinical Impression   Pt admitted for above diagnosis and has deficits listed below.  Pt would benefit from cont OT to increase I with basic adls to mod I level of care and become more educated on energy conservation techniques before returning home.    OT Assessment  Patient does not need any further OT services (could benefit but does not want them at home.)    Follow Up Recommendations  No OT follow up;Supervision/Assistance - 24 hour    Barriers to Discharge      Equipment Recommendations  Tub/shower seat;Other (comment) (only if she does NOT have to pay for it.)    Recommendations for Other Services    Frequency       Precautions / Restrictions Precautions Precautions: Fall Restrictions Weight Bearing Restrictions: No   Pertinent Vitals/Pain Pt c/o constant abdominal pain for years.  O2 sats in 90s on 2L of O2.    ADL  Eating/Feeding: Performed;Independent Where Assessed - Eating/Feeding: Chair Grooming: Performed;Wash/dry hands;Teeth care;Brushing hair;Supervision/safety Where Assessed - Grooming: Supported standing Upper Body Bathing: Simulated;Set up Where Assessed - Upper Body Bathing: Unsupported sitting Lower Body Bathing: Simulated;Supervision/safety Where Assessed - Lower Body Bathing: Supported sit to stand Upper Body Dressing: Performed;Set up Where Assessed - Upper Body Dressing: Unsupported sitting Lower Body Dressing: Performed;Supervision/safety Where Assessed -  Lower Body Dressing: Supported sit to stand Toilet Transfer: Research scientist (life sciences) Method: Other (comment) (walked to bathroom) Acupuncturist: Regular height toilet;Grab bars Toileting - Clothing Manipulation and Hygiene: Performed;Min guard Where Assessed - Engineer, mining and Hygiene: Standing Equipment Used: Cane Transfers/Ambulation Related to ADLs: Pt walked in room with O2 and cane.  ADL Comments: Pt did well with all adls UE and LE.  Pt fatigues very quickly.  Reviewed energy conservation techniqes she can use at home to save energy.    OT Diagnosis:    OT Problem List:   OT Treatment Interventions:     OT Goals(Current goals can be found in the care plan section) Acute Rehab OT Goals Patient Stated Goal: To be able to return home soon OT Goal Formulation: With patient Time For Goal Achievement: 11/23/12 Potential to Achieve Goals: Good ADL Goals Pt Will Perform Tub/Shower Transfer: with modified independence;rolling walker;ambulating Additional ADL Goal #1: Pt will complete all toileting tasks with mod I using walker or cane. Additional ADL Goal #2: Pt will state 3 energy conservation techniques she can use to save energy during adls.  Visit Information  Last OT Received On: 11/16/12 Assistance Needed: +1 History of Present Illness: Patient is a 62 yo female with history of hypertension and diabetes, non-small cell lung cancer status post left upper lobectomy on chemotherapy, comes in for hyperglycemia. She has been to the ED 3 times as her blood glucose are hard to control.        Prior Functioning     Home Living Family/patient expects to be discharged to:: Private residence Living Arrangements: Children Available Help at Discharge: Family;Available 24 hours/day Type of Home: House Home Access:  Stairs to enter [employer] of Steps: 3 Entrance Stairs-Rails: None Home Layout: One level Home Equipment:  Cane - single point;Walker - 2 wheels Additional Comments: Pt could benefit from tub seat but states she cannot afford it and insurance will not pay for it. Prior Function Level of Independence: Needs assistance Gait / Transfers Assistance Needed: walks with cane or walker with distant S at home. ADL's / Homemaking Assistance Needed: meal prep, housekeeping, occasionally with bathing. Communication Communication: No difficulties Dominant Hand: Right         Vision/Perception Vision - History Baseline Vision: Wears glasses all the time Patient Visual Report: No change from baseline Vision - Assessment Vision Assessment: Vision not tested   Cognition  Cognition Arousal/Alertness: Awake/alert Behavior During Therapy: WFL for tasks assessed/performed Overall Cognitive Status: Within Functional Limits for tasks assessed    Extremity/Trunk Assessment Upper Extremity Assessment Upper Extremity Assessment: Overall WFL for tasks assessed Lower Extremity Assessment Lower Extremity Assessment: Defer to PT evaluation Cervical / Trunk Assessment Cervical / Trunk Assessment: Normal     Mobility Transfers Transfers: Sit to Stand;Stand to Sit Sit to Stand: 5: Supervision;From chair/3-in-1 Stand to Sit: 5: Supervision;To chair/3-in-1     Exercise     Balance Balance Balance Assessed: Yes Static Standing Balance Static Standing - Balance Support: Left upper extremity supported Static Standing - Level of Assistance: 5: Stand by assistance Static Standing - Comment/# of Minutes: 5 at sink   End of Session OT - End of Session Equipment Utilized During Treatment: Oxygen Activity Tolerance: Patient tolerated treatment well Patient left: in chair;with call bell/phone within reach Nurse Communication: Mobility status  GO     Hope Budds 11/16/2012, 11:20 AM [phone]

## 2012-11-16 NOTE — Plan of Care (Signed)
Problem: Food- and Nutrition-Related Knowledge Deficit (NB-1.1) Goal: Nutrition education Formal process to instruct or train a patient/client in a skill or to impart knowledge to help patients/clients voluntarily manage or modify food choices and eating behavior to maintain or improve health. Outcome: Completed/Met Date Met:  11/16/12  RD consulted for nutrition education regarding diabetes.     Lab Results  Component Value Date    HGBA1C 8.6* 11/14/2012    RD provided "Carbohydrate Counting for People with Diabetes" handout from the Academy of Nutrition and Dietetics. Discussed different food groups and their effects on blood sugar, emphasizing carbohydrate-containing foods. Provided list of carbohydrates and recommended serving sizes of common foods.  Discussed importance of controlled and consistent carbohydrate intake throughout the day. Provided examples of ways to balance meals/snacks and encouraged intake of high-fiber, whole grain complex carbohydrates. Teach back method used.  Expect good compliance.  Body mass index is 35.82 kg/(m^2). Pt meets criteria for Obese Class I based on current BMI.  Current diet order is Carbohydrate Modified High, patient is consuming approximately 80% of meals at this time. Labs and medications reviewed. No further nutrition interventions warranted at this time. RD contact information provided. If additional nutrition issues arise, please re-consult RD.  Jarold Motto MS, RD, LDN Pager: [phone] After-hours pager: [phone]

## 2012-11-18 ENCOUNTER — Other Ambulatory Visit: Payer: Medicare Other | Admitting: Lab

## 2012-11-18 ENCOUNTER — Ambulatory Visit: Payer: Medicare Other | Admitting: Internal Medicine

## 2012-11-18 ENCOUNTER — Telehealth: Payer: Self-pay | Admitting: Family Medicine

## 2012-11-18 MED ORDER — AZITHROMYCIN 250 MG PO TABS: ORAL_TABLET | ORAL | Status: DC

## 2012-11-18 NOTE — Telephone Encounter (Signed)
Call in a Zpack  ?

## 2012-11-18 NOTE — Telephone Encounter (Signed)
I sent script e-scribe and spoke with pt. 

## 2012-11-18 NOTE — Telephone Encounter (Signed)
Patient Information:  Caller Name: [place]  Phone: [phone]  Patient: [name], [name]  Gender: Female  DOB: September 21, 1950  Age: 62 Years  PCP: [facility])  Office Follow Up:  Does the office need to follow up with this patient?: Yes  Instructions For The Office: Please call.  RN Note:  Pt has a productive cough w/yellow phlegm. Denies breathing difficulty. Cannot take Tylenol, Motrin or ASA for her fever due to liver disease. She has no car, depends on social services for a ride but has to plan 3 days in advance for pickup so she cannot make it to the office.  Symptoms  Reason For Call & Symptoms: Cough and fever  Reviewed Health History In EMR: Yes  Reviewed Medications In EMR: Yes  Reviewed Allergies In EMR: Yes  Reviewed Surgeries / Procedures: Yes  Date of Onset of Symptoms: 11/17/2012  Any Fever: Yes  Fever Taken: Oral  Fever Time Of Reading: 13:30:00  Fever Last Reading: 101.6  Guideline(s) Used:  Cough  Disposition Per Guideline:   Go to Office Now  Reason For Disposition Reached:   Fever > 100.5 F (38.1 C) and over 7 years of age  Advice Given:  Coughing Spasms:  Drink warm fluids. Inhale warm mist (Reason: both relax the airway and loosen up the phlegm).  Prevent Dehydration:  Drink adequate liquids.  Patient Refused Recommendation:  Patient Requests Prescription if possible  Rx requested since she has no way to the office.

## 2012-11-23 ENCOUNTER — Ambulatory Visit (INDEPENDENT_AMBULATORY_CARE_PROVIDER_SITE_OTHER): Payer: Medicare Other | Admitting: Family Medicine

## 2012-11-23 ENCOUNTER — Encounter: Payer: Self-pay | Admitting: Family Medicine

## 2012-11-23 VITALS — BP 130/74 | HR 94 | Temp 98.0°F | Wt 180.0 lb

## 2012-11-23 DIAGNOSIS — I10 Essential (primary) hypertension: Secondary | ICD-10-CM

## 2012-11-23 DIAGNOSIS — J449 Chronic obstructive pulmonary disease, unspecified: Secondary | ICD-10-CM

## 2012-11-23 DIAGNOSIS — F329 Major depressive disorder, single episode, unspecified: Secondary | ICD-10-CM

## 2012-11-23 DIAGNOSIS — E119 Type 2 diabetes mellitus without complications: Secondary | ICD-10-CM

## 2012-11-23 MED ORDER — FLUTICASONE-SALMETEROL 250-50 MCG/DOSE IN AEPB: 1.0000 | INHALATION_SPRAY | Freq: Two times a day (BID) | RESPIRATORY_TRACT | Status: DC

## 2012-11-23 NOTE — Progress Notes (Signed)
  Subjective:    Patient ID: [name], female    DOB: 1950-08-02, 62 y.o.   MRN: [id number]  HPI Here to follow up a hospital stay from 11-14-12 to 11-16-12 for nonketotic hyperglycemia. Her admission glucoses were often above 500, although her A1c was only 8.6. She was dehydrated and her creatinine was up. After hydration this returned to normal at 0.96. Her Lantus was increased to 50 units bid. Today she feels better but she is still getting glucoses in the 400s.    Review of Systems  Constitutional: Positive for fatigue.  Respiratory: Negative.   Cardiovascular: Negative.        Objective:   Physical Exam  Constitutional: She appears well-developed and well-nourished.  Using her walker   Cardiovascular: Normal rate, regular rhythm, normal heart sounds and intact distal pulses.   Pulmonary/Chest: Effort normal and breath sounds normal.          Assessment & Plan:  She is stable for the time being but her diabetes is not well controlled. We will refer her to Endocrine ASAP.

## 2012-11-25 ENCOUNTER — Telehealth: Payer: Self-pay | Admitting: Family Medicine

## 2012-11-25 MED ORDER — INSULIN PEN NEEDLE 32G X 4 MM MISC: Status: DC

## 2012-11-25 NOTE — Telephone Encounter (Signed)
Pt called in to request needles to both insulin pens.  Pt would prefer the "nano" size, states that she would like the smallest needles possible.  Please advise.  Pt's pharmacy is [facility] on [address].

## 2012-11-25 NOTE — Telephone Encounter (Signed)
Error/njr °

## 2012-11-25 NOTE — Telephone Encounter (Signed)
I sent script e-scribe. 

## 2012-11-26 ENCOUNTER — Other Ambulatory Visit: Payer: Self-pay | Admitting: *Deleted

## 2012-11-26 MED ORDER — INSULIN GLARGINE 100 UNIT/ML ~~LOC~~ SOLN: 50.0000 [IU] | Freq: Two times a day (BID) | SUBCUTANEOUS | Status: DC

## 2012-12-03 ENCOUNTER — Ambulatory Visit (HOSPITAL_BASED_OUTPATIENT_CLINIC_OR_DEPARTMENT_OTHER): Payer: Medicare Other | Admitting: Physician Assistant

## 2012-12-03 ENCOUNTER — Encounter: Payer: Self-pay | Admitting: Physician Assistant

## 2012-12-03 ENCOUNTER — Other Ambulatory Visit (HOSPITAL_BASED_OUTPATIENT_CLINIC_OR_DEPARTMENT_OTHER): Payer: Medicare Other | Admitting: Lab

## 2012-12-03 VITALS — BP 145/70 | HR 81 | Temp 97.7°F | Resp 17 | Ht 60.0 in | Wt 181.7 lb

## 2012-12-03 DIAGNOSIS — Z85118 Personal history of other malignant neoplasm of bronchus and lung: Secondary | ICD-10-CM

## 2012-12-03 DIAGNOSIS — D696 Thrombocytopenia, unspecified: Secondary | ICD-10-CM

## 2012-12-03 DIAGNOSIS — D649 Anemia, unspecified: Secondary | ICD-10-CM

## 2012-12-03 DIAGNOSIS — K7460 Unspecified cirrhosis of liver: Secondary | ICD-10-CM

## 2012-12-03 DIAGNOSIS — K922 Gastrointestinal hemorrhage, unspecified: Secondary | ICD-10-CM

## 2012-12-03 LAB — CBC WITH DIFFERENTIAL/PLATELET
BASO%: 0.7 % (ref 0.0–2.0)
EOS%: 1.7 % (ref 0.0–7.0)
Eosinophils Absolute: 0.1 10*3/uL (ref 0.0–0.5)
HGB: 11.3 g/dL — ABNORMAL LOW (ref 11.6–15.9)
LYMPH%: 15 % (ref 14.0–49.7)
MCH: 29 pg (ref 25.1–34.0)
MCV: 87.9 fL (ref 79.5–101.0)
MONO#: 0.2 10*3/uL (ref 0.1–0.9)
MONO%: 7.7 % (ref 0.0–14.0)
NEUT#: 2.3 10*3/uL (ref 1.5–6.5)
RBC: 3.9 10*6/uL (ref 3.70–5.45)
RDW: 17.2 % — ABNORMAL HIGH (ref 11.2–14.5)
WBC: 3 10*3/uL — ABNORMAL LOW (ref 3.9–10.3)
lymph#: 0.5 10*3/uL — ABNORMAL LOW (ref 0.9–3.3)
nRBC: 0 % (ref 0–0)

## 2012-12-03 LAB — FERRITIN CHCC: Ferritin: 176 ng/ml (ref 9–269)

## 2012-12-03 LAB — IRON AND TIBC CHCC
TIBC: 289 ug/dL (ref 236–444)
UIBC: 229 ug/dL (ref 120–384)

## 2012-12-03 NOTE — Patient Instructions (Signed)
You have being scheduled for a bone marrow biopsy to further evaluate your low blood counts Followup with Dr. Arbutus Ped in 2-3 weeks after your bone marrow biopsy

## 2012-12-03 NOTE — Progress Notes (Addendum)
[facility] [phone]   [phone]  OFFICE PROGRESS NOTE  Catherine Salisbury, MD [address] [place] [address]  DIAGNOSIS:  1) stage IB non-small cell lung cancer diagnosed in April 2004 status post left upper lobectomy by Dr. Dorris Fetch.  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  3) anemia of chronic disease plus/minus deficiency secondary to GI bleed.   PRIOR THERAPY:none   CURRENT THERAPY: observation.  INTERVAL HISTORY: [name] 62 y.o. female returns to the clinic today for followup visit and evaluation. She reports that she was hospitalized in October for elevated blood sugar readings as well as anemia. She is scheduled to see an endocrinologist tomorrow for evaluation for possible insulin pump. She is currently on Lantus 50 units taken twice daily as well as Humalog insulin 20 mg before breakfast 20 units before lunch and 25 units before dinner. She notes some increase shortness of breath but reports that her Advair helps as well as her albuterol inhaler when needed. Additionally she takes 1 g of metformin twice daily the She scheduled to have some teeth extracted on 12/16/2012. She reports that she naps a lot and uses her oxygen when she sleeps. Oxygen saturation 2 L the a nasal cannula. Patient's chest x-ray on 11/13/2012 as part of her admission which revealed no acute cardiopulmonary process. CT of the abdomen and pelvis also done during that admission on 11/06/2012 revealed increased spleen and 21 cm which was mildly increased from previous study, there was also hepatic cirrhosis and descending colostomy. She denied having any other significant complaints and specifically no nausea or vomiting, no fever or chills. She denied having any significant weight loss or night sweats. She has no chest pain but continues to have shortness breath with exertion. She had repeat CBC performed earlier today and she is  here for evaluation and discussion of her lab results and recommendation regarding treatment of her condition.   MEDICAL HISTORY: Past Medical History  Diagnosis Date  . Cirrhosis   . Diabetes mellitus   . GERD (gastroesophageal reflux disease)   . Cervical disc syndrome   . Lung cancer     squamous cell  . Chronic respiratory failure   . Diverticulitis of colon   . Perforation of colon   . Arthritis   . IBS (irritable bowel syndrome)   . Chronic lower GI bleeding   . Overactive bladder   . Cervical cancer   . Thrombocytopenia     sees Dr. Arbutus Ped   . Splenomegaly   . Anxiety   . Depression   . Hypertension   . Asthma   . Chronic headache disorder   . Blood transfusion without reported diagnosis   . Heart murmur   . Hyperlipidemia   . Lung cancer   . COPD (chronic obstructive pulmonary disease)     sees Dr. Shelle Iron   . Anemia     sees Dr. Arbutus Ped, due to chronic disease and GI losses     ALLERGIES:  is allergic to codeine phosphate; hydrocodone-acetaminophen; morphine; penicillins; and cephalexin.  MEDICATIONS:  Current Outpatient Prescriptions  Medication Sig Dispense Refill  . albuterol-ipratropium (COMBIVENT) 18-103 MCG/ACT inhaler Inhale 2 puffs into the lungs every 4 (four) hours as needed for wheezing or shortness of breath. For asthma       . azithromycin (ZITHROMAX Z-PAK) 250 MG tablet Take as directed  6 each  0  . cholecalciferol (VITAMIN D) 1000 UNITS tablet Take  1,000 Units by mouth daily.       . diazepam (VALIUM) 5 MG tablet Take 1 tablet (5 mg total) by mouth 3 (three) times daily. For anxiety  90 tablet  5  . dicyclomine (BENTYL) 10 MG capsule Take 1 capsule (10 mg total) by mouth 3 (three) times daily.  90 capsule  11  . esomeprazole (NEXIUM) 40 MG capsule Take 40 mg by mouth every evening.      . Fluticasone-Salmeterol (ADVAIR) 250-50 MCG/DOSE AEPB Inhale 1 puff into the lungs every 12 (twelve) hours.  60 each  11  . folic acid (FOLVITE) 1 MG tablet  Take 1 mg by mouth every evening.       . furosemide (LASIX) 20 MG tablet Take 20 mg by mouth daily.       . insulin glargine (LANTUS) 100 UNIT/ML injection Inject 0.5 mLs (50 Units total) into the skin 2 (two) times daily.  10 mL  12  . insulin lispro (HUMALOG) 100 UNIT/ML injection Inject 20 Units into the skin 3 (three) times daily before meals. 20 units at breakfast and lunch, then 25 units at dinner      . Insulin Pen Needle (EASY TOUCH PEN NEEDLES) 32G X 4 MM MISC Use as directed, diagnosis code is 250.00  100 each  3  . metFORMIN (GLUCOPHAGE) 1000 MG tablet Take 1,000 mg by mouth 2 (two) times daily with a meal.      . metoCLOPramide (REGLAN) 10 MG tablet Take 10 mg by mouth 3 (three) times daily.      . Oxycodone HCl 20 MG TABS Take 20 mg by mouth every 3 (three) hours as needed (pain).      . potassium chloride (MICRO-K) 10 MEQ CR capsule Take 1 capsule by mouth daily.      . promethazine (PHENERGAN) 25 MG tablet Take 25 mg by mouth every 6 (six) hours as needed for nausea. For nausea      . rifaximin (XIFAXAN) 550 MG TABS tablet Take 550 mg by mouth 2 (two) times daily.      [name] tolterodine (DETROL) 2 MG tablet Take 2 mg by mouth daily.      . traZODone (DESYREL) 100 MG tablet Take 2 tablets (200 mg total) by mouth at bedtime.  60 tablet  11   No current facility-administered medications for this visit.    SURGICAL HISTORY:  Past Surgical History  Procedure Laterality Date  . Appendectomy    . Cholecystectomy    . Colostomy    . Pneumonectomy    . Cervix removed    . Bowel resection    . Esophagogastroduodenoscopy  03/19/2011    Procedure: ESOPHAGOGASTRODUODENOSCOPY (EGD);  Surgeon: Louis Meckel, MD;  Location: [facility];  Service: Endoscopy;  Laterality: N/A;  . Colonoscopy  03/19/2011    Procedure: COLONOSCOPY;  Surgeon: Louis Meckel, MD;  Location: [facility];  Service: Endoscopy;  Laterality: N/A;  . Givens capsule study  03/20/2011    Procedure: GIVENS CAPSULE  STUDY;  Surgeon: Louis Meckel, MD;  Location: [facility];  Service: Endoscopy;  Laterality: N/A;  . Small bowel obstruction repair  March 2012  . Umbilical hernia repair  March 2012    REVIEW OF SYSTEMS:  Constitutional: positive for fatigue Eyes: negative Ears, nose, mouth, throat, and face: negative Respiratory: positive for dyspnea on exertion Cardiovascular: negative Gastrointestinal: negative Genitourinary:negative Integument/breast: negative Hematologic/lymphatic: Anemia and low platelets Musculoskeletal:negative Neurological: negative Behavioral/Psych: negative Allergic/Immunologic: negative   PHYSICAL  EXAMINATION: General appearance: alert, cooperative, fatigued and no distress Head: Normocephalic, without obvious abnormality, atraumatic Neck: no adenopathy and no JVD Lymph nodes: Cervical, supraclavicular, and axillary nodes normal. Resp: clear to auscultation bilaterally Cardio: regular rate and rhythm, S1, S2 normal, no murmur, click, rub or gallop GI: soft, non-tender; bowel sounds normal; no masses,  no organomegaly Extremities: extremities normal, atraumatic, no cyanosis or edema Neurologic: Alert and oriented X 3, normal strength and tone. Normal symmetric reflexes. Normal coordination and gait  ECOG PERFORMANCE STATUS: 1 - Symptomatic but completely ambulatory  Blood pressure 145/70, pulse 81, temperature 97.7 F (36.5 C), temperature source Oral, resp. rate 17, height 5' (1.524 m), weight 181 lb 11.2 oz (82.419 kg), SpO2 96.00%.  LABORATORY DATA: Lab Results  Component Value Date   WBC 3.0* 12/03/2012   HGB 11.3* 12/03/2012   HCT 34.3* 12/03/2012   MCV 87.9 12/03/2012   PLT 42* 12/03/2012      Chemistry      Component Value Date/Time   NA 137 11/16/2012 0500   NA 138 10/08/2012 0757   K 4.4 11/16/2012 0500   K 5.0 10/08/2012 0757   CL 103 11/16/2012 0500   CL 103 03/23/2012 0938   CO2 22 11/16/2012 0500   CO2 23 10/08/2012 0757   BUN 18 11/16/2012  0500   BUN 21.9 10/08/2012 0757   CREATININE 0.96 11/16/2012 0500   CREATININE 1.3* 10/08/2012 0757      Component Value Date/Time   CALCIUM 9.8 11/16/2012 0500   CALCIUM 9.9 10/08/2012 0757   ALKPHOS 77 11/14/2012 1515   ALKPHOS 65 10/08/2012 0757   AST 47* 11/14/2012 1515   AST 41* 10/08/2012 0757   ALT 45* 11/14/2012 1515   ALT 49 10/08/2012 0757   BILITOT 0.4 11/14/2012 1515   BILITOT 0.31 10/08/2012 0757     Ferritin today (12/03/2012) was 176, serum iron 35,% saturation 7%   RADIOGRAPHIC STUDIES: No results found.  ASSESSMENT AND PLAN:  1) persistent anemia questionable for anemia of chronic disease plus/minus deficiency: The patient is currently asymptomatic and her hemoglobin is up to 11.3 g/dL. 2) thrombocytopenia: Secondary to liver cirrhosis. Currently stable. We will continue to monitor. We will refer the patient to encourage you radiology for bone marrow biopsy with cytology and flow cytometry to further evaluate her leukopenia and thrombocytopenia. Her anemia has improved somewhat although this may be a result of her recent admission. 3) history of stage IB non-small cell lung cancer diagnosed in 2004. Continue on observation. 4) followup visit in 2 to 3 weeks after the bone marrow biopsy to discuss the results with repeat CBC   Patient was discussed with also seen by Dr. Arbutus Ped.  Conni Slipper, PA-C  The patient voices understanding of current disease status and treatment options and is in agreement with the current care plan.  All questions were answered. The patient knows to call the clinic with any problems, questions or concerns. We can certainly see the patient much sooner if necessary.  ADDENDUM: Hematology/Oncology Attending: I had the face to face encounter with the patient. I recommended her care plan. This is a very pleasant 62 years old white female with history of stage IB non-small cell lung cancer as well as history of anemia of chronic disease  plus/minus deficiency and thrombocytopenia. The patient is doing fine today with no specific complaints except for the persistent fatigue. She continues to have pancytopenia. I recommended for her to consider a bone marrow biopsy and aspirate  to rule out any other etiology for his pancytopenia. The patient would come back for followup visit in 3 weeks for evaluation and discussion of her biopsy results. She was advised to call immediately if she has any concerning symptoms in the interval. Lajuana Matte., MD 12/06/2012

## 2012-12-04 ENCOUNTER — Ambulatory Visit (INDEPENDENT_AMBULATORY_CARE_PROVIDER_SITE_OTHER): Payer: Medicare Other | Admitting: Internal Medicine

## 2012-12-04 ENCOUNTER — Encounter: Payer: Self-pay | Admitting: Internal Medicine

## 2012-12-04 VITALS — BP 110/64 | HR 90 | Temp 98.4°F | Resp 12 | Ht 60.0 in | Wt 181.3 lb

## 2012-12-04 DIAGNOSIS — E1101 Type 2 diabetes mellitus with hyperosmolarity with coma: Secondary | ICD-10-CM

## 2012-12-04 MED ORDER — INSULIN GLARGINE 100 UNIT/ML ~~LOC~~ SOLN: 50.0000 [IU] | Freq: Two times a day (BID) | SUBCUTANEOUS | Status: DC

## 2012-12-04 MED ORDER — INSULIN LISPRO 100 UNIT/ML ~~LOC~~ SOLN: 20.0000 [IU] | Freq: Three times a day (TID) | SUBCUTANEOUS | Status: DC

## 2012-12-04 NOTE — Patient Instructions (Addendum)
Please return in 1 month with your sugar log.  - Continue Metformin 1000 mg po bid - Continue Lantus 50 units bid - Increase Humalog to 25-20-30 units Try to inject in the abdomen. Inject 15 min before a meal.   PATIENT INSTRUCTIONS FOR TYPE 2 DIABETES:  **Please join MyChart!** - see attached instructions about how to join   DIET AND EXERCISE Diet and exercise is an important part of diabetic treatment.  We recommended aerobic exercise in the form of brisk walking (working between 40-60% of maximal aerobic capacity, similar to brisk walking) for 150 minutes per week (such as 30 minutes five days per week) along with 3 times per week performing 'resistance' training (using various gauge rubber tubes with handles) 5-10 exercises involving the major muscle groups (upper body, lower body and core) performing 10-15 repetitions (or near fatigue) each exercise. Start at half the above goal but build slowly to reach the above goals. If limited by weight, joint pain, or disability, we recommend daily walking in a swimming pool with water up to waist to reduce pressure from joints while allow for adequate exercise.    BLOOD GLUCOSES Monitoring your blood glucoses is important for continued management of your diabetes. Please check your blood glucoses 2-4 times a day: fasting, before meals and at bedtime (you can rotate these measurements - e.g. one day check before the 3 meals, the next day check before 2 of the meals and before bedtime, etc.   HYPOGLYCEMIA (low blood sugar) Hypoglycemia is usually a reaction to not eating, exercising, or taking too much insulin/ other diabetes drugs.  Symptoms include tremors, sweating, hunger, confusion, headache, etc. Treat IMMEDIATELY with 15 grams of Carbs:   4 glucose tablets    cup regular juice/soda   2 tablespoons raisins   4 teaspoons sugar   1 tablespoon honey Recheck blood glucose in 15 mins and repeat above if still symptomatic/blood glucose  <100. Please contact our office at [phone] if you have questions about how to next handle your insulin.  RECOMMENDATIONS TO REDUCE YOUR RISK OF DIABETIC COMPLICATIONS: * Take your prescribed MEDICATION(S). * Follow a DIABETIC diet: Complex carbs, fiber rich foods, heart healthy fish twice weekly, (monounsaturated and polyunsaturated) fats * AVOID saturated/trans fats, high fat foods, >2,300 mg salt per day. * EXERCISE at least 5 times a week for 30 minutes or preferably daily.  * DO NOT SMOKE OR DRINK more than 1 drink a day. * Check your FEET every day. Do not wear tightfitting shoes. Contact us if you develop an ulcer * See your EYE doctor once a year or more if needed * Get a FLU shot once a year * Get a PNEUMONIA vaccine once before and once after age 43 years  GOALS:  * Your Hemoglobin A1c of <7%  * fasting sugars need to be <130 * after meals sugars need to be <180 (2h after you start eating) * Your Systolic BP should be 140 or lower  * Your Diastolic BP should be 80 or lower  * Your HDL (Good Cholesterol) should be 40 or higher  * Your LDL (Bad Cholesterol) should be 100 or lower  * Your Triglycerides should be 150 or lower  * Your Urine microalbumin (kidney function) should be <30 * Your Body Mass Index should be 25 or lower   We will be glad to help you achieve these goals. Our telephone number is: [phone].

## 2012-12-04 NOTE — Progress Notes (Signed)
Patient ID: [name], female   DOB: 06/25/1950, 62 y.o.   MRN: [id number]  HPI: [name] is a 62 y.o.-year-old female, referred by her PCP, Dr. Clent Ridges, for management of DM2, insulin-dependent, uncontrolled, with complications (PN, admission for [facility]).  Pt was recently admitted for HHNK on 11/14/2012, CBG on admission was 421, at home 599.   Patient has been diagnosed with diabetes in 2004; she started insulin 2006-07. She went back to po meds 2 years later >> sugars increased >> restarted insulin 2 mo ago.   Last hemoglobin A1c was: Lab Results  Component Value Date   HGBA1C 8.6* 11/14/2012   HGBA1C 8.7* 11/02/2012   HGBA1C 8.4* 06/29/2012  HbA1c likely not reflecting her DM control.  Pt is on a regimen of: - Metformin 1000 mg po bid - Lantus 50 units bid - pen - Humalog 20-20-25 units - pen She does not pay anything for her insulins. She injects Humalog at the time of the meals, not 15 minutes before! Injects in her thighs as she has an ostomy bag and surgery scar. She changes the needle every time. She keeps the needle in 5 secs after last drop of insulin in.  She was on Amaryl before.  Pt checks her sugars 4-6x a day and they are: - am: 150-230 - 2h after b'fast: 300-499 - before lunch: 247-480 - 2h after lunch: n/c - before dinner: 231-504 - bedtime: 280-480 No lows. Lowest sugar was 150; she has hypoglycemia awareness at 150. Highest sugar was 504.  Pt's meals are: - Breakfast: 1/2 bowl instant oat meal - Lunch: can of soup, or tuna sandwich, or [place] sandwich - Dinner: lasagna, or tuna noodle casserole, or meatloaf, or chicken nuggets + green beans + peas, or mashed potatoes - Snacks: 2 a day: sugar free yoghurt, Vienna sausage She saw nutrition in the hospital >> given a list of foods to try.  - no CKD, last BUN/creatinine:  Lab Results  Component Value Date   BUN 18 11/16/2012   CREATININE 0.96 11/16/2012   - last eye exam was in 04/2012. No DR.  - +  numbness and tingling in her feet.  I reviewed her chart and she also has a history of Squamous cell lung cancer, hypertension, COPD, GERD, depression, anemia and turbo cytopenia, cirrhosis, IBS. She will have a BM Bx next week.  Pt has FH of DM in sister, son.   ROS: Constitutional: no weight gain/loss, + fatigue, + subjective hyperthermia, + poor sleep, + nocturia Eyes: no blurry vision, no xerophthalmia ENT: no sore throat, no nodules palpated in throat, no dysphagia/odynophagia, no hoarseness; + decreased hearing Cardiovascular: no CP/+ SOB/no palpitations/+ leg swelling Respiratory: + cough/+ SOB Gastrointestinal: + N/no V/+ D (IBS) /C, + heartburn Musculoskeletal: + muscle/+ joint aches Skin: no rashes; + easy bruising, + hair loss Neurological: no tremors/numbness/tingling/dizziness, + HA Psychiatric: no depression/+ anxiety  Past Medical History  Diagnosis Date  . Cirrhosis   . Diabetes mellitus   . GERD (gastroesophageal reflux disease)   . Cervical disc syndrome   . Lung cancer     squamous cell  . Chronic respiratory failure   . Diverticulitis of colon   . Perforation of colon   . Arthritis   . IBS (irritable bowel syndrome)   . Chronic lower GI bleeding   . Overactive bladder   . Cervical cancer   . Thrombocytopenia     sees Dr. Arbutus Ped   . Splenomegaly   .  Anxiety   . Depression   . Hypertension   . Asthma   . Chronic headache disorder   . Blood transfusion without reported diagnosis   . Heart murmur   . Hyperlipidemia   . Lung cancer   . COPD (chronic obstructive pulmonary disease)     sees Dr. Shelle Iron   . Anemia     sees Dr. Arbutus Ped, due to chronic disease and GI losses    Past Surgical History  Procedure Laterality Date  . Appendectomy    . Cholecystectomy    . Colostomy    . Pneumonectomy    . Cervix removed    . Bowel resection    . Esophagogastroduodenoscopy  03/19/2011    Procedure: ESOPHAGOGASTRODUODENOSCOPY (EGD);  Surgeon: Louis Meckel, MD;  Location: [facility];  Service: Endoscopy;  Laterality: N/A;  . Colonoscopy  03/19/2011    Procedure: COLONOSCOPY;  Surgeon: Louis Meckel, MD;  Location: [facility];  Service: Endoscopy;  Laterality: N/A;  . Givens capsule study  03/20/2011    Procedure: GIVENS CAPSULE STUDY;  Surgeon: Louis Meckel, MD;  Location: [facility];  Service: Endoscopy;  Laterality: N/A;  . Small bowel obstruction repair  March 2012  . Umbilical hernia repair  March 2012   History   Social History  . Marital Status: Divorced    Spouse Name: N/A    Number of Children: 4   Occupational History  . disabled    Social History Main Topics  . Smoking status: Former Smoker -- 2.00 packs/day for 35 years    Types: Cigarettes    Quit date: 03/24/2002  . Smokeless tobacco: Never Used  . Alcohol Use: No  . Drug Use: No  . Sexual Activity: No   Social History Narrative   Regular exercise: a little   Caffeine use: 2 cups of coffee in am   Current Outpatient Prescriptions on File Prior to Visit  Medication Sig Dispense Refill  . albuterol-ipratropium (COMBIVENT) 18-103 MCG/ACT inhaler Inhale 2 puffs into the lungs every 4 (four) hours as needed for wheezing or shortness of breath. For asthma       . azithromycin (ZITHROMAX Z-PAK) 250 MG tablet Take as directed  6 each  0  . cholecalciferol (VITAMIN D) 1000 UNITS tablet Take 1,000 Units by mouth daily.       . diazepam (VALIUM) 5 MG tablet Take 1 tablet (5 mg total) by mouth 3 (three) times daily. For anxiety  90 tablet  5  . dicyclomine (BENTYL) 10 MG capsule Take 1 capsule (10 mg total) by mouth 3 (three) times daily.  90 capsule  11  . esomeprazole (NEXIUM) 40 MG capsule Take 40 mg by mouth every evening.      . Fluticasone-Salmeterol (ADVAIR) 250-50 MCG/DOSE AEPB Inhale 1 puff into the lungs every 12 (twelve) hours.  60 each  11  . folic acid (FOLVITE) 1 MG tablet Take 1 mg by mouth every evening.       . furosemide (LASIX) 20 MG tablet  Take 20 mg by mouth daily.       . Insulin Pen Needle (EASY TOUCH PEN NEEDLES) 32G X 4 MM MISC Use as directed, diagnosis code is 250.00  100 each  3  . metFORMIN (GLUCOPHAGE) 1000 MG tablet Take 1,000 mg by mouth 2 (two) times daily with a meal.      . metoCLOPramide (REGLAN) 10 MG tablet Take 10 mg by mouth 3 (three) times daily.      [name]  Oxycodone HCl 20 MG TABS Take 20 mg by mouth every 3 (three) hours as needed (pain).      . potassium chloride (MICRO-K) 10 MEQ CR capsule Take 1 capsule by mouth daily.      . promethazine (PHENERGAN) 25 MG tablet Take 25 mg by mouth every 6 (six) hours as needed for nausea. For nausea      . rifaximin (XIFAXAN) 550 MG TABS tablet Take 550 mg by mouth 2 (two) times daily.      [name] tolterodine (DETROL) 2 MG tablet Take 2 mg by mouth daily.      . traZODone (DESYREL) 100 MG tablet Take 2 tablets (200 mg total) by mouth at bedtime.  60 tablet  11   No current facility-administered medications on file prior to visit.   Allergies  Allergen Reactions  . Codeine Phosphate     Stomach cramps  . Hydrocodone-Acetaminophen     hallucinations  . Morphine     Lowers BP  . Penicillins     Rash, tongue swelling  . Cephalexin Swelling and Rash   Family History  Problem Relation Age of Onset  . Coronary artery disease    . Diabetes type II    . Anesthesia problems Neg Hx   . Hypotension Neg Hx   . Malignant hyperthermia Neg Hx   . Pseudochol deficiency Neg Hx   . Heart attack Mother   . Diabetes type II Mother   . Cirrhosis Father    PE: BP 110/64  Pulse 90  Temp(Src) 98.4 F (36.9 C) (Oral)  Resp 12  Ht 5' (1.524 m)  Wt 181 lb 4.8 oz (82.237 kg)  BMI 35.41 kg/m2  SpO2 97% Wt Readings from Last 3 Encounters:  12/04/12 181 lb 4.8 oz (82.237 kg)  12/03/12 181 lb 11.2 oz (82.419 kg)  11/23/12 180 lb (81.647 kg)   Constitutional: obese, in NAD Eyes: PERRLA, EOMI, no exophthalmos ENT: moist mucous membranes, no thyromegaly, no cervical  lymphadenopathy Cardiovascular: RRR, No MRG Respiratory: CTA B Gastrointestinal: abdomen soft, NT, ND, BS+; ostomy bag - LLQ, site clean, nonerythmatous Musculoskeletal: no deformities, strength intact in all 4 Skin: moist, warm, no rashes Neurological: no tremor with outstretched hands, DTR normal in all 4  ASSESSMENT: 1. DM2, non-insulin-dependent, uncontrolled, with complications - PN - HHNK admission  PLAN:  1. Patient with long-standing, recently more uncontrolled diabetes, on basal-bolus insulin tx + oral antidiabetic regimen, which is insufficient. She appears to have high insulin resistance and I am not sure whether this is because of her injection technique or other reasons. She is injecting in the thighs where she does not have a lot of fat, so first thing I advised her to inject in her abdomen. She can easily avoid the site of the ostomy and the midline scar. I also advised her to move the Humalog injections to 50 minutes before a meal. We did discuss about diet and she definitely needs some help but she is trying to work on the dietary advice given in the hospital. She cannot do much exercise, as she is weak and walking with a walker. She actually fell approximately a week ago while going to the restroom at night because she was not using her walker. She is now using a walker all the time. - We discussed about options for treatment, and I suggested to:  Please return in 1 month with your sugar log.  - Continue Metformin 1000 mg po bid - Continue Lantus 50  units bid - Increase Humalog to 25-20-30 units Try to inject in the abdomen. Inject 15 min before a meal.  - discussed proper injection techniques:  Preference for the abdominal sq tissue  Rotation of sites  Change needle for each injection  Keep needle in for 10 sec after last unit of insulin in - continue checking sugars at different times of the day - check 4 times a day, rotating checks - she is doing a good job with  this, but I gave her new log and advised how to fill them and to bring them at next appt  - given foot care handout and explained the principles  - given instructions for hypoglycemia management "15-15 rule"  - advised for yearly eye exams, she is up to date - discussed about a possible insulin pump, per suggestion from PCP. I explained that to qualify for an insulin pump, a pt need to: - be highly motivated - have the capacity to program the pump - check sugars 4-8 times a day in the previous 2 months before starting the pump - respond to warning alarms - calculate pre-meal boluses based on carbohydrate intake Also, to be covered by [employer], a fasting C peptide need to be <110% of LLN while Glu <225. - due to high insulin requirements, I would like to try to optimize her current insulin regimen first, and I think U500 will be an option if we get to >200 units a day, but I'm not convinced that an insulin pump would be a good option for now. This might be an option in the future. We'll keep this in mind. - given samples of Humalog - refilled Humalog (Rx to pharmacy) - Return to clinic in 1 mo with sugar log

## 2012-12-07 ENCOUNTER — Other Ambulatory Visit: Payer: Self-pay | Admitting: *Deleted

## 2012-12-07 ENCOUNTER — Other Ambulatory Visit: Payer: Self-pay | Admitting: Physician Assistant

## 2012-12-08 ENCOUNTER — Telehealth: Payer: Self-pay | Admitting: *Deleted

## 2012-12-08 ENCOUNTER — Telehealth: Payer: Self-pay | Admitting: Internal Medicine

## 2012-12-08 ENCOUNTER — Other Ambulatory Visit: Payer: Self-pay | Admitting: Physician Assistant

## 2012-12-08 ENCOUNTER — Other Ambulatory Visit: Payer: Self-pay | Admitting: *Deleted

## 2012-12-08 DIAGNOSIS — D649 Anemia, unspecified: Secondary | ICD-10-CM

## 2012-12-08 DIAGNOSIS — D696 Thrombocytopenia, unspecified: Secondary | ICD-10-CM

## 2012-12-08 NOTE — Telephone Encounter (Signed)
PT. WANTS TO KNOW THE DATE AND TIME FOR HER BONE MARROW BIOPSY.

## 2012-12-08 NOTE — Telephone Encounter (Signed)
INFORMED PT. THAT HER BONE MARROW BIOPSY WILL BE DONE AT [facility]. THE SCHEDULER WILL BE CONTACTING HER LATE TODAY OR TOMORROW WITH THE DATE AND TIME.

## 2012-12-08 NOTE — Telephone Encounter (Signed)
lvm for pt for bx and f/u appt with lab...mailed pt appt sched/letter and avs

## 2012-12-14 ENCOUNTER — Other Ambulatory Visit: Payer: Self-pay | Admitting: Radiology

## 2012-12-14 ENCOUNTER — Telehealth: Payer: Self-pay | Admitting: *Deleted

## 2012-12-14 NOTE — Telephone Encounter (Signed)
Called pt and advised her to increase your mealtime insulin by 10 units each meal: 35-30-40 before B-L-D. Advised pt to reduce her fats and sweets. Please call back in 3 days with her sugar readings. Pt states she is having a bone biopsy on Thurs and she will call us back on Friday.  Be advised.

## 2012-12-14 NOTE — Telephone Encounter (Signed)
Pt called stating her bg has increased.  Nov  9th  over 300 Nov 10th 394   490 (before lunch) Nov 12th 551 (in the morning)    482 (after lunch) Nov 14th 251 (before lunch)    508 (after lunch)   445 (before dinner) Nov 15th 513 Nov 17th 265 (before breakfast)   439 (before lunch   378 (afer lunch)  Pt states she is unable to come in. She does not have transportation. Please advise.

## 2012-12-14 NOTE — Telephone Encounter (Signed)
Increase mealtime insulin by 10 units each: 35-30-40 before B-L-D, respectively. Try to reduce fat and sweets. Please call back in 3 days with sugars.

## 2012-12-16 ENCOUNTER — Ambulatory Visit (HOSPITAL_COMMUNITY): Payer: Medicare Other

## 2012-12-17 ENCOUNTER — Ambulatory Visit (HOSPITAL_COMMUNITY)
Admission: RE | Admit: 2012-12-17 | Discharge: 2012-12-17 | Disposition: A | Discharge status: Home / Self Care | Payer: Medicare Other | Source: Ambulatory Visit | Attending: Physician Assistant | Admitting: Physician Assistant

## 2012-12-17 ENCOUNTER — Encounter (HOSPITAL_COMMUNITY): Payer: Self-pay

## 2012-12-17 DIAGNOSIS — Z85118 Personal history of other malignant neoplasm of bronchus and lung: Secondary | ICD-10-CM | POA: Insufficient documentation

## 2012-12-17 DIAGNOSIS — Z794 Long term (current) use of insulin: Secondary | ICD-10-CM | POA: Insufficient documentation

## 2012-12-17 DIAGNOSIS — D696 Thrombocytopenia, unspecified: Secondary | ICD-10-CM

## 2012-12-17 DIAGNOSIS — D61818 Other pancytopenia: Secondary | ICD-10-CM | POA: Insufficient documentation

## 2012-12-17 DIAGNOSIS — K7460 Unspecified cirrhosis of liver: Secondary | ICD-10-CM | POA: Insufficient documentation

## 2012-12-17 DIAGNOSIS — E119 Type 2 diabetes mellitus without complications: Secondary | ICD-10-CM | POA: Insufficient documentation

## 2012-12-17 LAB — PROTIME-INR
INR: 1.08 (ref 0.00–1.49)
Prothrombin Time: 13.8 seconds (ref 11.6–15.2)

## 2012-12-17 LAB — CBC
Hemoglobin: 11.7 g/dL — ABNORMAL LOW (ref 12.0–15.0)
MCHC: 34.4 g/dL (ref 30.0–36.0)
Platelets: 41 10*3/uL — ABNORMAL LOW (ref 150–400)
RDW: 15.9 % — ABNORMAL HIGH (ref 11.5–15.5)
WBC: 2.6 10*3/uL — ABNORMAL LOW (ref 4.0–10.5)

## 2012-12-17 LAB — APTT: aPTT: 26 seconds (ref 24–37)

## 2012-12-17 LAB — GLUCOSE, CAPILLARY: Glucose-Capillary: 244 mg/dL — ABNORMAL HIGH (ref 70–99)

## 2012-12-17 MED ORDER — FENTANYL CITRATE 0.05 MG/ML IJ SOLN
INTRAMUSCULAR | Status: AC
  Filled 2012-12-17: qty 6

## 2012-12-17 MED ORDER — MIDAZOLAM HCL 2 MG/2ML IJ SOLN
INTRAMUSCULAR | Status: AC
  Filled 2012-12-17: qty 6

## 2012-12-17 MED ORDER — HYDROMORPHONE HCL PF 1 MG/ML IJ SOLN
INTRAMUSCULAR | Status: AC | PRN
  Administered 2012-12-17 (×2): 1 mg via INTRAVENOUS

## 2012-12-17 MED ORDER — SODIUM CHLORIDE 0.9 % IV SOLN
INTRAVENOUS | Status: DC
  Administered 2012-12-17: 08:00:00 via INTRAVENOUS

## 2012-12-17 MED ORDER — MIDAZOLAM HCL 2 MG/2ML IJ SOLN
INTRAMUSCULAR | Status: AC | PRN
  Administered 2012-12-17 (×2): 1 mg via INTRAVENOUS

## 2012-12-17 MED ORDER — OXYCODONE HCL 5 MG PO TABS
20.0000 mg | ORAL_TABLET | Freq: Once | ORAL | Status: DC
  Filled 2012-12-17 (×2): qty 4

## 2012-12-17 MED ORDER — OXYCODONE HCL 5 MG PO TABS
20.0000 mg | ORAL_TABLET | Freq: Once | ORAL | Status: AC
  Administered 2012-12-17: 20 mg via ORAL

## 2012-12-17 MED ORDER — HYDROMORPHONE HCL PF 2 MG/ML IJ SOLN
INTRAMUSCULAR | Status: AC
  Filled 2012-12-17: qty 1

## 2012-12-17 NOTE — Procedures (Signed)
CT guided bone marrow aspirates and biopsy.  No immediate complication. 

## 2012-12-17 NOTE — H&P (Signed)
[name] is an 62 y.o. female.   Chief Complaint: "I'm having a bone biopsy" HPI: Patient with history on NSC lung cancer , cirrhosis and pancytopenia presents today for CT guided bone marrow biopsy.  Past Medical History  Diagnosis Date  . Cirrhosis   . Diabetes mellitus   . GERD (gastroesophageal reflux disease)   . Cervical disc syndrome   . Lung cancer     squamous cell  . Chronic respiratory failure   . Diverticulitis of colon   . Perforation of colon   . Arthritis   . IBS (irritable bowel syndrome)   . Chronic lower GI bleeding   . Overactive bladder   . Cervical cancer   . Thrombocytopenia     sees Dr. Arbutus Ped   . Splenomegaly   . Anxiety   . Depression   . Hypertension   . Asthma   . Chronic headache disorder   . Blood transfusion without reported diagnosis   . Heart murmur   . Hyperlipidemia   . Lung cancer   . COPD (chronic obstructive pulmonary disease)     sees Dr. Shelle Iron   . Anemia     sees Dr. Arbutus Ped, due to chronic disease and GI losses     Past Surgical History  Procedure Laterality Date  . Appendectomy    . Cholecystectomy    . Colostomy    . Pneumonectomy    . Cervix removed    . Bowel resection    . Esophagogastroduodenoscopy  03/19/2011    Procedure: ESOPHAGOGASTRODUODENOSCOPY (EGD);  Surgeon: Louis Meckel, MD;  Location: [facility];  Service: Endoscopy;  Laterality: N/A;  . Colonoscopy  03/19/2011    Procedure: COLONOSCOPY;  Surgeon: Louis Meckel, MD;  Location: [facility];  Service: Endoscopy;  Laterality: N/A;  . Givens capsule study  03/20/2011    Procedure: GIVENS CAPSULE STUDY;  Surgeon: Louis Meckel, MD;  Location: [facility];  Service: Endoscopy;  Laterality: N/A;  . Small bowel obstruction repair  March 2012  . Umbilical hernia repair  March 2012    Family History  Problem Relation Age of Onset  . Coronary artery disease    . Diabetes type II    . Anesthesia problems Neg Hx   . Hypotension Neg Hx   .  Malignant hyperthermia Neg Hx   . Pseudochol deficiency Neg Hx   . Heart attack Mother   . Diabetes type II Mother   . Cirrhosis Father    Social History:  reports that she quit smoking about 10 years ago. Her smoking use included Cigarettes. She has a 70 pack-year smoking history. She has never used smokeless tobacco. She reports that she does not drink alcohol or use illicit drugs.  Allergies:  Allergies  Allergen Reactions  . Codeine Phosphate     Stomach cramps  . Hydrocodone-Acetaminophen     hallucinations  . Morphine     Lowers BP  . Penicillins     Rash, tongue swelling  . Cephalexin Swelling and Rash    Current outpatient prescriptions:albuterol-ipratropium (COMBIVENT) 18-103 MCG/ACT inhaler, Inhale 2 puffs into the lungs every 4 (four) hours as needed for wheezing or shortness of breath. For asthma , Disp: , Rfl: ;  cholecalciferol (VITAMIN D) 1000 UNITS tablet, Take 1,000 Units by mouth daily. , Disp: , Rfl: ;  diazepam (VALIUM) 5 MG tablet, Take 1 tablet (5 mg total) by mouth 3 (three) times daily. For anxiety, Disp: 90 tablet, Rfl: 5 dicyclomine (  BENTYL) 10 MG capsule, Take 1 capsule (10 mg total) by mouth 3 (three) times daily., Disp: 90 capsule, Rfl: 11;  esomeprazole (NEXIUM) 40 MG capsule, Take 40 mg by mouth every evening., Disp: , Rfl: ;  Fluticasone-Salmeterol (ADVAIR) 250-50 MCG/DOSE AEPB, Inhale 1 puff into the lungs every 12 (twelve) hours., Disp: 60 each, Rfl: 11;  folic acid (FOLVITE) 1 MG tablet, Take 1 mg by mouth every evening. , Disp: , Rfl:  furosemide (LASIX) 20 MG tablet, Take 20 mg by mouth daily. , Disp: , Rfl: ;  insulin glargine (LANTUS) 100 UNIT/ML injection, Inject 0.5 mLs (50 Units total) into the skin 2 (two) times daily., Disp: 30 mL, Rfl: 12;  insulin lispro (HUMALOG) 100 UNIT/ML injection, Inject 20 Units into the skin 3 (three) times daily before meals. Inject unde skin 25-20-30 units - pens, Disp: 15 mL, Rfl: 3 Insulin Pen Needle (EASY TOUCH PEN  NEEDLES) 32G X 4 MM MISC, Use as directed, diagnosis code is 250.00, Disp: 100 each, Rfl: 3;  metFORMIN (GLUCOPHAGE) 1000 MG tablet, Take 1,000 mg by mouth 2 (two) times daily with a meal., Disp: , Rfl: ;  metoCLOPramide (REGLAN) 10 MG tablet, Take 10 mg by mouth 3 (three) times daily., Disp: , Rfl: ;  Oxycodone HCl 20 MG TABS, Take 20 mg by mouth every 3 (three) hours as needed (pain)., Disp: , Rfl:  potassium chloride (MICRO-K) 10 MEQ CR capsule, Take 1 capsule by mouth daily., Disp: , Rfl: ;  promethazine (PHENERGAN) 25 MG tablet, Take 25 mg by mouth every 6 (six) hours as needed for nausea. For nausea, Disp: , Rfl: ;  rifaximin (XIFAXAN) 550 MG TABS tablet, Take 550 mg by mouth 2 (two) times daily., Disp: , Rfl: ;  tolterodine (DETROL) 2 MG tablet, Take 2 mg by mouth daily., Disp: , Rfl:  traZODone (DESYREL) 100 MG tablet, Take 2 tablets (200 mg total) by mouth at bedtime., Disp: 60 tablet, Rfl: 11;  azithromycin (ZITHROMAX Z-PAK) 250 MG tablet, Take as directed, Disp: 6 each, Rfl: 0 Current facility-administered medications:0.9 %  sodium chloride infusion, , Intravenous, Continuous, D Jeananne Rama, PA-C, Last Rate: 20 mL/hr at 12/17/12 0745   Results for orders placed during the hospital encounter of 12/17/12 (from the past 48 hour(s))  APTT     Status: None   Collection Time    12/17/12  7:26 AM      Result Value Range   aPTT 26  24 - 37 seconds  CBC     Status: Abnormal   Collection Time    12/17/12  7:26 AM      Result Value Range   WBC 2.6 (*) 4.0 - 10.5 K/uL   RBC 3.89  3.87 - 5.11 MIL/uL   Hemoglobin 11.7 (*) 12.0 - 15.0 g/dL   HCT 09.8 (*) 11.9 - 14.7 %   MCV 87.4  78.0 - 100.0 fL   MCH 30.1  26.0 - 34.0 pg   MCHC 34.4  30.0 - 36.0 g/dL   RDW 82.9 (*) 56.2 - 13.0 %   Platelets 41 (*) 150 - 400 K/uL   Comment: PLATELET COUNT CONFIRMED BY SMEAR  PROTIME-INR     Status: None   Collection Time    12/17/12  7:26 AM      Result Value Range   Prothrombin Time 13.8  11.6 - 15.2  seconds   INR 1.08  0.00 - 1.49   No results found.  Review of Systems  Constitutional: Negative  for fever and chills.  Respiratory: Positive for cough and shortness of breath.   Cardiovascular: Negative for chest pain.  Gastrointestinal: Positive for nausea, abdominal pain and blood in stool. Negative for vomiting.  Musculoskeletal: Positive for back pain.  Neurological: Positive for weakness and headaches.  Psychiatric/Behavioral: The patient is nervous/anxious.     Blood pressure 155/72, pulse 80, temperature 97 F (36.1 C), temperature source Oral, resp. rate 18, height 5' (1.524 m), weight 181 lb (82.101 kg), SpO2 99.00%. Physical Exam  Constitutional: She is oriented to person, place, and time. She appears well-developed and well-nourished.  Cardiovascular: Normal rate and regular rhythm.   Respiratory: Effort normal.  BS clear ant  GI: Soft. Bowel sounds are normal. There is tenderness.  Intact colostomy with dark bloody stool/gas in bag  Musculoskeletal: Normal range of motion. She exhibits edema.  Neurological: She is alert and oriented to person, place, and time.     Assessment/Plan Pt with hx of NSC lung carcinoma, cirrhosis and pancytopenia. Plan is for CT guided bone marrow biopsy today. Details/risks of procedure d/w pt/daughter with their understanding and consent.  Rooney Swails,D KEVIN 12/17/2012, 8:39 AM

## 2012-12-18 ENCOUNTER — Encounter: Payer: Self-pay | Admitting: Family Medicine

## 2012-12-21 ENCOUNTER — Ambulatory Visit (INDEPENDENT_AMBULATORY_CARE_PROVIDER_SITE_OTHER): Payer: Medicare Other | Admitting: Family Medicine

## 2012-12-21 ENCOUNTER — Encounter: Payer: Self-pay | Admitting: Family Medicine

## 2012-12-21 VITALS — BP 140/60 | HR 87 | Temp 98.4°F | Wt 186.0 lb

## 2012-12-21 DIAGNOSIS — K589 Irritable bowel syndrome without diarrhea: Secondary | ICD-10-CM

## 2012-12-21 DIAGNOSIS — E1149 Type 2 diabetes mellitus with other diabetic neurological complication: Secondary | ICD-10-CM

## 2012-12-21 DIAGNOSIS — D696 Thrombocytopenia, unspecified: Secondary | ICD-10-CM

## 2012-12-21 DIAGNOSIS — F329 Major depressive disorder, single episode, unspecified: Secondary | ICD-10-CM

## 2012-12-21 DIAGNOSIS — I10 Essential (primary) hypertension: Secondary | ICD-10-CM

## 2012-12-21 DIAGNOSIS — K219 Gastro-esophageal reflux disease without esophagitis: Secondary | ICD-10-CM

## 2012-12-21 DIAGNOSIS — E119 Type 2 diabetes mellitus without complications: Secondary | ICD-10-CM

## 2012-12-21 DIAGNOSIS — J449 Chronic obstructive pulmonary disease, unspecified: Secondary | ICD-10-CM

## 2012-12-21 MED ORDER — POTASSIUM CHLORIDE ER 10 MEQ PO CPCR: 10.0000 meq | ORAL_CAPSULE | Freq: Every day | ORAL | Status: DC

## 2012-12-21 MED ORDER — OXYCODONE HCL 20 MG PO TABS: 20.0000 mg | ORAL_TABLET | ORAL | Status: DC | PRN

## 2012-12-21 NOTE — Progress Notes (Signed)
  Subjective:    Patient ID: [name], female    DOB: 1950/09/19, 62 y.o.   MRN: [id number]  HPI Here to follow up on multiple issues. She has seen Dr. Elvera Lennox for her diabetes and they are adjusting her insulin regimen. She had a bone marrow biopsy recently and she will follow up with Dr. Arbutus Ped to discuss these results. She is due to have the last 2 upper teeth removed in a few weeks and she is excited to be able to get an upper plate at that point.    Review of Systems  Constitutional: Negative.   Respiratory: Negative.   Cardiovascular: Negative.   Gastrointestinal: Negative.        Objective:   Physical Exam  Constitutional: She is oriented to person, place, and time. She appears well-developed and well-nourished.  Using her walker   Cardiovascular: Normal rate, regular rhythm, normal heart sounds and intact distal pulses.   Pulmonary/Chest: Effort normal and breath sounds normal.  Neurological: She is alert and oriented to person, place, and time.          Assessment & Plan:  She is doing well all in all. Refilled her oxycodone today and we will have her sign a controlled substance contract.

## 2012-12-21 NOTE — Progress Notes (Signed)
Pre visit review using our clinic review tool, if applicable. No additional management support is needed unless otherwise documented below in the visit note. 

## 2012-12-28 ENCOUNTER — Other Ambulatory Visit (HOSPITAL_BASED_OUTPATIENT_CLINIC_OR_DEPARTMENT_OTHER): Payer: Medicare Other

## 2012-12-28 ENCOUNTER — Telehealth: Payer: Self-pay | Admitting: Internal Medicine

## 2012-12-28 ENCOUNTER — Ambulatory Visit (HOSPITAL_BASED_OUTPATIENT_CLINIC_OR_DEPARTMENT_OTHER): Payer: Medicare Other | Admitting: Internal Medicine

## 2012-12-28 ENCOUNTER — Encounter: Payer: Self-pay | Admitting: Internal Medicine

## 2012-12-28 VITALS — BP 139/56 | HR 80 | Temp 97.7°F | Resp 17 | Ht 60.0 in | Wt 183.5 lb

## 2012-12-28 DIAGNOSIS — K7460 Unspecified cirrhosis of liver: Secondary | ICD-10-CM

## 2012-12-28 DIAGNOSIS — Z85118 Personal history of other malignant neoplasm of bronchus and lung: Secondary | ICD-10-CM

## 2012-12-28 DIAGNOSIS — R161 Splenomegaly, not elsewhere classified: Secondary | ICD-10-CM

## 2012-12-28 DIAGNOSIS — D696 Thrombocytopenia, unspecified: Secondary | ICD-10-CM

## 2012-12-28 DIAGNOSIS — D649 Anemia, unspecified: Secondary | ICD-10-CM

## 2012-12-28 DIAGNOSIS — R0609 Other forms of dyspnea: Secondary | ICD-10-CM

## 2012-12-28 LAB — CBC WITH DIFFERENTIAL/PLATELET
BASO%: 0.9 % (ref 0.0–2.0)
Basophils Absolute: 0 10*3/uL (ref 0.0–0.1)
EOS%: 1.7 % (ref 0.0–7.0)
HCT: 35.8 % (ref 34.8–46.6)
HGB: 11.7 g/dL (ref 11.6–15.9)
LYMPH%: 14.2 % (ref 14.0–49.7)
MCH: 29.7 pg (ref 25.1–34.0)
MCHC: 32.7 g/dL (ref 31.5–36.0)
MCV: 90.7 fL (ref 79.5–101.0)
NEUT%: 73.1 % (ref 38.4–76.8)
Platelets: 46 10*3/uL — ABNORMAL LOW (ref 145–400)
RDW: 16.1 % — ABNORMAL HIGH (ref 11.2–14.5)

## 2012-12-28 NOTE — Patient Instructions (Signed)
Referral to Dr. Daphine Deutscher for consideration of splenectomy. Followup visit in 3 months with repeat blood work.

## 2012-12-28 NOTE — Progress Notes (Signed)
[facility] [phone]   [phone]  OFFICE PROGRESS NOTE  [name], MD [address] [place] [address]  DIAGNOSIS:  1) stage IB non-small cell lung cancer diagnosed in April 2004 status post left upper lobectomy by Dr. Dorris Fetch.  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  3) anemia of chronic disease plus/minus deficiency secondary to GI bleed.   PRIOR THERAPY:none   CURRENT THERAPY: observation.  INTERVAL HISTORY: [name] 62 y.o. female returns to the clinic today for followup visit accompanied by her daughter. She continues to complain of persistent fatigue and bleeding from the colostomy bag.  She had a recent bone marrow biopsy and aspirate performed by interventional radiology and the patient complains that it was very painful. She denied having any other significant complaints and specifically no nausea or vomiting, no fever or chills. She denied having any significant weight loss or night sweats. She has no chest pain but continues to have shortness breath with exertion. She has no other bleeding, bruises or ecchymosis. His pain has been enlarged on several scans last one was performed on 11/06/2012. She had repeat CBC performed earlier today and she is here for evaluation and discussion of her lab and biopsy results.   MEDICAL HISTORY: Past Medical History  Diagnosis Date  . Cirrhosis   . GERD (gastroesophageal reflux disease)   . Cervical disc syndrome   . Lung cancer     squamous cell  . Chronic respiratory failure   . Diverticulitis of colon   . Perforation of colon   . Arthritis   . IBS (irritable bowel syndrome)   . Chronic lower GI bleeding   . Overactive bladder   . Cervical cancer   . Thrombocytopenia     sees Dr. Arbutus Ped   . Splenomegaly   . Anxiety   . Depression   . Hypertension   . Asthma   . Chronic headache disorder   . Blood transfusion without reported  diagnosis   . Heart murmur   . Hyperlipidemia   . Lung cancer   . COPD (chronic obstructive pulmonary disease)     sees Dr. Shelle Iron   . Anemia     sees Dr. Arbutus Ped, due to chronic disease and GI losses   . Diabetes mellitus     sees Dr. Elvera Lennox     ALLERGIES:  is allergic to codeine phosphate; hydrocodone-acetaminophen; morphine; penicillins; and cephalexin.  MEDICATIONS:  Current Outpatient Prescriptions  Medication Sig Dispense Refill  . albuterol-ipratropium (COMBIVENT) 18-103 MCG/ACT inhaler Inhale 2 puffs into the lungs every 4 (four) hours as needed for wheezing or shortness of breath. For asthma       . cholecalciferol (VITAMIN D) 1000 UNITS tablet Take 1,000 Units by mouth daily.       . diazepam (VALIUM) 5 MG tablet Take 1 tablet (5 mg total) by mouth 3 (three) times daily. For anxiety  90 tablet  5  . dicyclomine (BENTYL) 10 MG capsule Take 1 capsule (10 mg total) by mouth 3 (three) times daily.  90 capsule  11  . esomeprazole (NEXIUM) 40 MG capsule Take 40 mg by mouth every evening.      . Fluticasone-Salmeterol (ADVAIR) 250-50 MCG/DOSE AEPB Inhale 1 puff into the lungs every 12 (twelve) hours.  60 each  11  . folic acid (FOLVITE) 1 MG tablet Take 1 mg by mouth every evening.       . insulin glargine (LANTUS)  100 UNIT/ML injection Inject 0.5 mLs (50 Units total) into the skin 2 (two) times daily.  30 mL  12  . insulin lispro (HUMALOG) 100 UNIT/ML injection Inject into the skin 3 (three) times daily before meals. 35-30-40 units -      . Insulin Pen Needle (EASY TOUCH PEN NEEDLES) 32G X 4 MM MISC Use as directed, diagnosis code is 250.00  100 each  3  . metFORMIN (GLUCOPHAGE) 1000 MG tablet Take 1,000 mg by mouth 2 (two) times daily with a meal.      . metoCLOPramide (REGLAN) 10 MG tablet Take 10 mg by mouth 3 (three) times daily.      . Oxycodone HCl 20 MG TABS Take 1 tablet (20 mg total) by mouth every 3 (three) hours as needed (pain).  240 tablet  0  . potassium chloride  (MICRO-K) 10 MEQ CR capsule Take 1 capsule (10 mEq total) by mouth daily.  90 capsule  3  . promethazine (PHENERGAN) 25 MG tablet Take 25 mg by mouth every 6 (six) hours as needed for nausea. For nausea      . rifaximin (XIFAXAN) 550 MG TABS tablet Take 550 mg by mouth 2 (two) times daily.      [name] tolterodine (DETROL) 2 MG tablet Take 2 mg by mouth daily.      . traZODone (DESYREL) 100 MG tablet Take 2 tablets (200 mg total) by mouth at bedtime.  60 tablet  11  . furosemide (LASIX) 20 MG tablet Take 20 mg by mouth daily.        No current facility-administered medications for this visit.    SURGICAL HISTORY:  Past Surgical History  Procedure Laterality Date  . Appendectomy    . Cholecystectomy    . Colostomy    . Pneumonectomy    . Cervix removed    . Bowel resection    . Esophagogastroduodenoscopy  03/19/2011    Procedure: ESOPHAGOGASTRODUODENOSCOPY (EGD);  Surgeon: Louis Meckel, MD;  Location: [facility];  Service: Endoscopy;  Laterality: N/A;  . Colonoscopy  03/19/2011    Procedure: COLONOSCOPY;  Surgeon: Louis Meckel, MD;  Location: [facility];  Service: Endoscopy;  Laterality: N/A;  . Givens capsule study  03/20/2011    Procedure: GIVENS CAPSULE STUDY;  Surgeon: Louis Meckel, MD;  Location: [facility];  Service: Endoscopy;  Laterality: N/A;  . Small bowel obstruction repair  March 2012  . Umbilical hernia repair  March 2012    REVIEW OF SYSTEMS:  Constitutional: positive for fatigue Eyes: negative Ears, nose, mouth, throat, and face: negative Respiratory: positive for dyspnea on exertion Cardiovascular: negative Gastrointestinal: positive for Bleeding from the ostomy site. Genitourinary:negative Integument/breast: negative Hematologic/lymphatic: Anemia and low platelets Musculoskeletal:negative Neurological: negative Behavioral/Psych: negative Allergic/Immunologic: negative   PHYSICAL EXAMINATION: General appearance: alert, cooperative, fatigued and no  distress Head: Normocephalic, without obvious abnormality, atraumatic Neck: no adenopathy and no JVD Lymph nodes: Cervical, supraclavicular, and axillary nodes normal. Resp: clear to auscultation bilaterally Cardio: regular rate and rhythm, S1, S2 normal, no murmur, click, rub or gallop GI: soft, non-tender; bowel sounds normal; no masses,  no organomegaly Extremities: extremities normal, atraumatic, no cyanosis or edema Neurologic: Alert and oriented X 3, normal strength and tone. Normal symmetric reflexes. Normal coordination and gait  ECOG PERFORMANCE STATUS: 1 - Symptomatic but completely ambulatory  Blood pressure 139/56, pulse 80, temperature 97.7 F (36.5 C), temperature source Oral, resp. rate 17, height 5' (1.524 m), weight 183 lb 8 oz (83.235  kg), SpO2 97.00%.  LABORATORY DATA: Lab Results  Component Value Date   WBC 4.6 12/28/2012   HGB 11.7 12/28/2012   HCT 35.8 12/28/2012   MCV 90.7 12/28/2012   PLT 46* 12/28/2012      Chemistry      Component Value Date/Time   NA 137 11/16/2012 0500   NA 138 10/08/2012 0757   K 4.4 11/16/2012 0500   K 5.0 10/08/2012 0757   CL 103 11/16/2012 0500   CL 103 03/23/2012 0938   CO2 22 11/16/2012 0500   CO2 23 10/08/2012 0757   BUN 18 11/16/2012 0500   BUN 21.9 10/08/2012 0757   CREATININE 0.96 11/16/2012 0500   CREATININE 1.3* 10/08/2012 0757      Component Value Date/Time   CALCIUM 9.8 11/16/2012 0500   CALCIUM 9.9 10/08/2012 0757   ALKPHOS 77 11/14/2012 1515   ALKPHOS 65 10/08/2012 0757   AST 47* 11/14/2012 1515   AST 41* 10/08/2012 0757   ALT 45* 11/14/2012 1515   ALT 49 10/08/2012 0757   BILITOT 0.4 11/14/2012 1515   BILITOT 0.31 10/08/2012 0757       RADIOGRAPHIC STUDIES: Ct Biopsy  12/17/2012   CLINICAL DATA:  62 year old with history of lung cancer and pancytopenia.  EXAM: CT GUIDED BONE MARROW ASPIRATES AND BIOPSY  Physician: Rachelle Hora. Henn, MD  MEDICATIONS: Versed 2 mg and Dilaudid 2 mg. A radiology nurse monitored the  patient for moderate sedation.  ANESTHESIA/SEDATION: Sedation time: 11 min  PROCEDURE: The procedure was explained to the patient. The risks and benefits of the procedure were discussed and the patient's questions were addressed. Informed consent was obtained from the patient. The patient was placed prone on CT scan. Images of the pelvis were obtained. The right side of back was prepped and draped in sterile fashion. The skin and right posterior iliac bone were anesthetized with 1% lidocaine. 11 gauge bone needle was directed into the right iliac bone with CT guidance. Two aspirates and one core biopsy obtained.  COMPLICATIONS: None  FINDINGS: Bone needle directed into the posterior right iliac bone.  IMPRESSION: CT guided bone marrow aspirates and core biopsy.   Electronically Signed   By: Richarda Overlie M.D.   On: 12/17/2012 12:55   BONE MARROW REPORT FINAL DIAGNOSIS Diagnosis Bone Marrow, Aspirate,Biopsy, and Clot, right iliac - NORMOCELLULAR BONE MARROW FOR AGE WITH TRILINEAGE HEMATOPOIESIS. - SEE COMMENT. PERIPHERAL BLOOD: - PANCYTOPENIA. Diagnosis Note The bone marrow is normocellular with trilineage hematopoiesis but with relative abundance of erythroid precursors. Significant dyspoiesis is not present and there is no evidence of metastatic carcinoma. The overall changes are nonspecific and not diagnostic of a myelodysplastic state. Nonetheless, correlation with cytogenetic studies is recommended. (BNS:gt, 01-04-2013) Guerry Bruin MD Pathologist, Electronic Signature (Case signed 2013/01/04)   ASSESSMENT AND PLAN:  1) persistent anemia questionable for anemia of chronic disease plus/minus deficiency: The patient is currently asymptomatic except for mild fatigue. Previous iron study and ferritin were normal.  Her bone marrow biopsy and aspirate showed nonspecific finding with no evidence for a myelodysplastic or myeloproliferative disorder. 2) thrombocytopenia: Secondary to liver cirrhosis and  splenomegaly. Currently stable. I have a lengthy discussion with the patient today about her condition. I discussed with her consideration of splenectomy as an option for treatment of her I thrombocytopenia. I will refer the patient to Dr. Wenda Low for discussion of this option.  3) history of stage IB non-small cell lung cancer diagnosed in 2014. Continue on observation. 4) followup  visit in 3 months with repeat CBC and comprehensive metabolic panel. She was advised to call immediately if she has any concerning symptoms in the interval. The patient voices understanding of current disease status and treatment options and is in agreement with the current care plan.  All questions were answered. The patient knows to call the clinic with any problems, questions or concerns. We can certainly see the patient much sooner if necessary.  I spent 15 minutes counseling the patient face to face. The total time spent in the appointment was 25 minutes.

## 2012-12-28 NOTE — Telephone Encounter (Signed)
Gave pt appt for lab and MD on March 2015, pt will see Dr. Magnus Ivan on December 2014

## 2012-12-30 ENCOUNTER — Other Ambulatory Visit: Payer: Self-pay | Admitting: *Deleted

## 2012-12-30 MED ORDER — INSULIN PEN NEEDLE 32G X 4 MM MISC: Status: DC

## 2012-12-30 NOTE — Telephone Encounter (Signed)
Had to resend rx for pen needles. Pt needs them sent to [facility].

## 2012-12-31 ENCOUNTER — Telehealth: Payer: Self-pay | Admitting: *Deleted

## 2012-12-31 NOTE — Telephone Encounter (Signed)
Contacted the pharmacy to be sure they received the e-script for the pt's pen needles. They had, but insurance will not fill due to the fact the pt has received a supply for 90 days and it is too soon. They were unable to reach pt, they did not have an updated phone number. Called pt and advised her to the situation. Pt said the order was not enough for what she needed. Pt asked if we had any samples, I advised her we had some. She is coming to pick up some. Pt pleased.

## 2013-01-05 ENCOUNTER — Encounter: Payer: Self-pay | Admitting: Internal Medicine

## 2013-01-05 ENCOUNTER — Ambulatory Visit (INDEPENDENT_AMBULATORY_CARE_PROVIDER_SITE_OTHER): Payer: Medicare Other | Admitting: Surgery

## 2013-01-05 ENCOUNTER — Ambulatory Visit (INDEPENDENT_AMBULATORY_CARE_PROVIDER_SITE_OTHER): Payer: Medicare Other | Admitting: Internal Medicine

## 2013-01-05 VITALS — BP 120/80 | HR 90 | Temp 99.0°F | Resp 12 | Wt 184.5 lb

## 2013-01-05 DIAGNOSIS — E1149 Type 2 diabetes mellitus with other diabetic neurological complication: Secondary | ICD-10-CM

## 2013-01-05 MED ORDER — INSULIN LISPRO 100 UNIT/ML ~~LOC~~ SOLN: SUBCUTANEOUS | Status: DC

## 2013-01-05 NOTE — Patient Instructions (Signed)
Please change the insulin regimen as follows: - continue Metformin 1000 mg po bid - continue Lantus 50 units 2x a day - increase Humalog to 35-30-40 >> 40-35-45 units daily Please schedule a new appointment in 2 weeks.  Please consider the following ways to cut down carbs and fat and increase fiber and micronutrients in your diet: - substitute whole grain for white bread or pasta - substitute brown rice for white rice - substitute 90-calorie flat bread pieces for slices of bread when possible - substitute sweet potatoes or yams for white potatoes - substitute humus for margarine - substitute tofu for cheese when possible - substitute almond or rice milk for regular milk (would not drink soy milk daily due to concern for soy estrogen influence on breast cancer risk) - substitute dark chocolate for other sweets when possible - substitute water - can add lemon or orange slices for taste - for diet sodas (artificial sweeteners will trick your body that you can eat sweets without getting calories and will lead you to overeating and weight gain in the long run) - do not skip breakfast or other meals (this will slow down the metabolism and will result in more weight gain over time)  - can try smoothies made from fruit and almond/rice milk in am instead of regular breakfast - can also try old-fashioned (not instant) oatmeal made with almond/rice milk in am - order the dressing on the side when eating salad at a restaurant (pour less than half of the dressing on the salad) - eat as little meat as possible - can try juicing, but should not forget that juicing will get rid of the fiber, so would alternate with eating raw veg./fruits or drinking smoothies - use as little oil as possible, even when using olive oil - can dress a salad with a mix of balsamic vinegar and lemon juice, for e.g. - use agave nectar, stevia sugar, or regular sugar rather than artificial sweateners - steam or broil/roast veggies   - snack on veggies/fruit/nuts (unsalted, preferably) when possible, rather than processed foods - reduce or eliminate aspartame in diet (it is in diet sodas, chewing gum, etc) Read the labels!  Try to read Dr. Katherina Right book: "Program for Reversing Diabetes" for the vegan concept and other ideas for healthy eating.  Try to replace snacking on these with drinking/eating: * soy or almond milk * veggies with humus or other low calorie/low fat dip * low glycemic index fruits (higher glycemic index = higher risk to increase your sugars):          http://www.health.https://www.brown.info/ * fruit/veggie smoothies          Ninja blender recipes:          [employer]Parks.com.ee * unsalted nuts Etc.

## 2013-01-05 NOTE — Progress Notes (Signed)
Patient ID: [name], female   DOB: 04/01/1950, 62 y.o.   MRN: [id number]  HPI: [name] is a 62 y.o.-year-old female, returns for f/u for DM2, dx 2004, insulin-dependent since 2006/7 - then off in 2009 - then restarted 08/2012, uncontrolled, with complications (PN, admission for [facility] on 11/14/2012). Last visit 1 mo ago.  She will need to have splenectomy for severe splenomegaly, but this is a problem b/c she is high risk for surgery. She will see a surgeon on 01/12/2013 (Dr. Magnus Ivan).  Last hemoglobin A1c was: Lab Results  Component Value Date   HGBA1C 8.6* 11/14/2012   HGBA1C 8.7* 11/02/2012   HGBA1C 8.4* 06/29/2012  HbA1c likely not reflecting her DM control as sugars in CBG log are much higher!  Pt is on a regimen of: - Metformin 1000 mg po bid - Lantus 50 units bid - pen - Humalog  35-30-40-units << 25-20-30 units << 20-20-25 units - pens Copay $0 for her insulins. She injects Humalog at the time of the meals, not 15 minutes before! Injects in her thighs as she has an ostomy bag and surgery scar. She changes the needle every time. She keeps the needle in 5 secs after last drop of insulin in.   Reviewed telephone notes his last visit:  [name], CMA at 12/14/2012 3:32 PM     Pt called stating her bg has increased.  Nov 9th over 300  Nov 10th 394  490 (before lunch)  Nov 12th 551 (in the morning)  482 (after lunch)  Nov 14th 251 (before lunch)  508 (after lunch)  445 (before dinner)  Nov 15th 513  Nov 17th 265 (before breakfast)  439 (before lunch  378 (afer lunch)  Pt states she is unable to come in. She does not have transportation. Please advise.    Increase mealtime insulin by 10 units each: 35-30-40 before B-L-D, respectively. Try to reduce fat and sweets. Please call back in 3 days with sugars.  Pt checks her sugars 4-6x a day and they are: - am: 150-230 >> 101-260 - 2h after b'fast: 300-499 >> 311-402 - highest sugars of the day - before  lunch: 247-480 >> 221-407 - 2h after lunch: n/c >> 160 x 1 check - before dinner: 231-504 >> 83-328 - bedtime: 280-480 >> 104-435 No lows. Lowest sugar was 83; she has hypoglycemia awareness at 150. Highest sugar was 435.  Pt's meals are: - Breakfast: 1/2 bowl old-fashioned oat meal (changed from instant before last visit) - Lunch: can of soup, or tuna sandwich, or [place] sandwich - Dinner: lasagna, or tuna noodle casserole, or meatloaf, or chicken nuggets + green beans + peas, or mashed potatoes - Snacks: 2 a day: sugar free yoghurt, [place] sausage She saw nutrition in the hospital >> given a list of foods to try. Diet is a problem for her because she has very poor dentition. She will have 2 teeth pulled soon, and would have dentures placed. She hopes that at that time she can eat better, but for now she needs to eat only soft foods.  - no CKD, last BUN/creatinine:  Lab Results  Component Value Date   BUN 18 11/16/2012   CREATININE 0.96 11/16/2012   - last eye exam was in 04/2012. No DR.  - + numbness and tingling in her feet.  She also has a history of Squamous cell lung cancer, hypertension, COPD, GERD, depression, anemia and thrombocytopenia, cirrhosis, IBS. She had a BM Bx since  I last saw her >> no malignancy.  I reviewed pt's medications, allergies, PMH, social hx, family hx and no changes required, except as mentioned above.  ROS: Constitutional: + weight gain, + fatigue, + subjective hyperthermia Eyes: no blurry vision, no xerophthalmia ENT: + sore throat, no nodules palpated in throat, no dysphagia/odynophagia, no hoarseness Cardiovascular: no CP/+ SOB/no palpitations/+ leg swelling Respiratory: + cough/+ SOB Gastrointestinal: no N/no V/+ D (IBS) /no C Musculoskeletal: no muscle aches/+ joint aches Skin: no rashes; + easy bruising, + hair loss Neurological: + tremors/no numbness/tingling/dizziness, + HA  PE: BP 120/80  Pulse 90  Temp(Src) 99 F (37.2 C) (Oral)   Resp 12  Wt 184 lb 8 oz (83.689 kg)  SpO2 95% Wt Readings from Last 3 Encounters:  01/05/13 184 lb 8 oz (83.689 kg)  12/28/12 183 lb 8 oz (83.235 kg)  12/21/12 186 lb (84.369 kg)   Constitutional: obese, in NAD Eyes: PERRLA, EOMI, no exophthalmos ENT: moist mucous membranes, no thyromegaly, no cervical lymphadenopathy Cardiovascular: RRR, No MRG Respiratory: CTA B Gastrointestinal: abdomen soft, NT, ND, BS+; ostomy bag - LLQ, site clean, nonerythmatous Musculoskeletal: no deformities, strength intact in all 4 Skin: moist, warm, + bruises on abdomen due to insulin injections Neurological: + tremor with outstretched hands, DTR normal in all 4  ASSESSMENT: 1. DM2, insulin-dependent, uncontrolled, with complications - PN - [facility] admission  PLAN:  1. Patient with long-standing, recently more uncontrolled diabetes, on basal-bolus insulin tx + oral antidiabetic regimen, which is insufficient. She in glucotoxic, with high insulin requirements. She is now injecting insulin before meals, in the abdomen, but developed bruises and pain at the site of the injections. I advised her to inject on the side of her abdomen. She cannot do much exercise, as she is weak and walking with a walker.  - we spent a lot of time talking about improving her diet, as a means for improving her insulin sensitivity and reducing glucotoxicity. I suggested different menus for breakfast, lunch, and dinner, adapted to her need for soft foods. I also gave her some more suggestions in writing (the patient instructions). - regarding her insulin regimen, I suggested to:  Patient Instructions  Please change the insulin regimen as follows: - continue Metformin 1000 mg po bid - continue Lantus 50 units 2x a day - increase Humalog to 35-30-40 >> 40-35-45 units daily Please schedule a new appointment in 2 weeks. - Given samples of pen needles - continue checking sugars at different times of the day - check 4 times a day, rotating  checks - she is doing a good job with this, but I gave her new logs - due to high insulin requirements, I would like to try to optimize her current insulin regimen first, and I think U500 will be an option. I would've liked to U 500 at this visit, however, we can wait until patient has the splenectomy to make this change. She is not sure whether she can have the surgery or not, since she is high surgical risk, and will meet with a surgeon on 01/12/2013 to discuss. She will return to see me soon after. If she is to go through with surgery, I think we should wait with the U 500 until after surgery. - Return to clinic in 2 weeks with sugar log

## 2013-01-12 ENCOUNTER — Encounter (INDEPENDENT_AMBULATORY_CARE_PROVIDER_SITE_OTHER): Payer: Self-pay | Admitting: Surgery

## 2013-01-12 ENCOUNTER — Ambulatory Visit (INDEPENDENT_AMBULATORY_CARE_PROVIDER_SITE_OTHER): Payer: Medicare Other | Admitting: Surgery

## 2013-01-12 VITALS — BP 150/80 | HR 96 | Temp 98.9°F | Resp 14 | Ht 60.0 in | Wt 183.0 lb

## 2013-01-12 DIAGNOSIS — R161 Splenomegaly, not elsewhere classified: Secondary | ICD-10-CM

## 2013-01-12 NOTE — Progress Notes (Signed)
Patient ID: [name], female   DOB: 1950/10/01, 62 y.o.   MRN: [id number]  Chief Complaint  Patient presents with  . New Evaluation    eval for spleenectomy    HPI [name] is a 62 y.o. female.   HPI This is a pleasant female referred by Dr. Arbutus Ped and Dr. Shellia Carwin for evaluation of splenomegaly and thrombocytopenia. She has had a massively enlarged spleen and low platelets for some time. She has a very complex medical history including previous left upper lobectomy for lung cancer, emergent colon resection after perforation from colostomy, parastomal hernia, and most recently, surgery in [place] last year for a bowel obstruction. She reports that the surgery for the bowel obstruction she was able to extubate in the operating room and was only in the hospital for approximately 5 days. She currently has chronic abdominal pain. She bruises very easily. She has difficulty stopping bleeding. She is only on oxygen at night when sleeping for sleep apnea. She has no shortness of breath when ambulating. Past Medical History  Diagnosis Date  . Cirrhosis   . GERD (gastroesophageal reflux disease)   . Cervical disc syndrome   . Lung cancer     squamous cell  . Chronic respiratory failure   . Diverticulitis of colon   . Perforation of colon   . Arthritis   . IBS (irritable bowel syndrome)   . Chronic lower GI bleeding   . Overactive bladder   . Cervical cancer   . Thrombocytopenia     sees Dr. Arbutus Ped   . Splenomegaly   . Anxiety   . Depression   . Hypertension   . Asthma   . Chronic headache disorder   . Blood transfusion without reported diagnosis   . Heart murmur   . Hyperlipidemia   . Lung cancer   . COPD (chronic obstructive pulmonary disease)     sees Dr. Shelle Iron   . Anemia     sees Dr. Arbutus Ped, due to chronic disease and GI losses   . Diabetes mellitus     sees Dr. Elvera Lennox     Past Surgical History  Procedure Laterality Date  . Appendectomy     . Cholecystectomy    . Colostomy    . Pneumonectomy    . Cervix removed    . Bowel resection    . Esophagogastroduodenoscopy  03/19/2011    Procedure: ESOPHAGOGASTRODUODENOSCOPY (EGD);  Surgeon: Louis Meckel, MD;  Location: [facility];  Service: Endoscopy;  Laterality: N/A;  . Colonoscopy  03/19/2011    Procedure: COLONOSCOPY;  Surgeon: Louis Meckel, MD;  Location: [facility];  Service: Endoscopy;  Laterality: N/A;  . Givens capsule study  03/20/2011    Procedure: GIVENS CAPSULE STUDY;  Surgeon: Louis Meckel, MD;  Location: [facility];  Service: Endoscopy;  Laterality: N/A;  . Small bowel obstruction repair  March 2012  . Umbilical hernia repair  March 2012    Family History  Problem Relation Age of Onset  . Coronary artery disease    . Diabetes type II    . Anesthesia problems Neg Hx   . Hypotension Neg Hx   . Malignant hyperthermia Neg Hx   . Pseudochol deficiency Neg Hx   . Heart attack Mother   . Diabetes type II Mother   . Cirrhosis Father     Social History History  Substance Use Topics  . Smoking status: Former Smoker -- 2.00 packs/day for 35 years  Types: Cigarettes    Quit date: 03/24/2002  . Smokeless tobacco: Never Used  . Alcohol Use: No    Allergies  Allergen Reactions  . Codeine Phosphate     Stomach cramps  . Hydrocodone-Acetaminophen     hallucinations  . Morphine     Lowers BP  . Penicillins     Rash, tongue swelling  . Cephalexin Swelling and Rash    Current Outpatient Prescriptions  Medication Sig Dispense Refill  . albuterol-ipratropium (COMBIVENT) 18-103 MCG/ACT inhaler Inhale 2 puffs into the lungs every 4 (four) hours as needed for wheezing or shortness of breath. For asthma       . cholecalciferol (VITAMIN D) 1000 UNITS tablet Take 1,000 Units by mouth daily.       . diazepam (VALIUM) 5 MG tablet Take 1 tablet (5 mg total) by mouth 3 (three) times daily. For anxiety  90 tablet  5  . dicyclomine (BENTYL) 10 MG capsule  Take 1 capsule (10 mg total) by mouth 3 (three) times daily.  90 capsule  11  . esomeprazole (NEXIUM) 40 MG capsule Take 40 mg by mouth every evening.      . Fluticasone-Salmeterol (ADVAIR) 250-50 MCG/DOSE AEPB Inhale 1 puff into the lungs every 12 (twelve) hours.  60 each  11  . folic acid (FOLVITE) 1 MG tablet Take 1 mg by mouth every evening.       . furosemide (LASIX) 20 MG tablet Take 20 mg by mouth daily.       . insulin glargine (LANTUS) 100 UNIT/ML injection Inject 0.5 mLs (50 Units total) into the skin 2 (two) times daily.  30 mL  12  . insulin lispro (HUMALOG) 100 UNIT/ML injection Inject under skin 40-35-45 units with breakfast-lunch-dinner, respectively.  10 mL  0  . Insulin Pen Needle (BD PEN NEEDLE NANO U/F) 32G X 4 MM MISC Use 4 times daily as directed.  150 each  3  . Insulin Pen Needle (EASY TOUCH PEN NEEDLES) 32G X 4 MM MISC Use as directed, diagnosis code is 250.00  100 each  3  . metFORMIN (GLUCOPHAGE) 1000 MG tablet Take 1,000 mg by mouth 2 (two) times daily with a meal.      . metoCLOPramide (REGLAN) 10 MG tablet Take 10 mg by mouth 3 (three) times daily.      . Oxycodone HCl 20 MG TABS Take 1 tablet (20 mg total) by mouth every 3 (three) hours as needed (pain).  240 tablet  0  . potassium chloride (MICRO-K) 10 MEQ CR capsule Take 1 capsule (10 mEq total) by mouth daily.  90 capsule  3  . promethazine (PHENERGAN) 25 MG tablet Take 25 mg by mouth every 6 (six) hours as needed for nausea. For nausea      . rifaximin (XIFAXAN) 550 MG TABS tablet Take 550 mg by mouth 2 (two) times daily.      [name] tolterodine (DETROL) 2 MG tablet Take 2 mg by mouth daily.      . traZODone (DESYREL) 100 MG tablet Take 2 tablets (200 mg total) by mouth at bedtime.  60 tablet  11   No current facility-administered medications for this visit.    Review of Systems Review of Systems  Constitutional: Negative for fever, chills and unexpected weight change.  HENT: Negative for congestion, hearing loss,  sore throat, trouble swallowing and voice change.   Eyes: Negative for visual disturbance.  Respiratory: Negative for cough, shortness of breath and wheezing.  Cardiovascular: Negative for chest pain, palpitations and leg swelling.  Gastrointestinal: Positive for abdominal pain, blood in stool and abdominal distention. Negative for nausea, vomiting, diarrhea, constipation and anal bleeding.  Genitourinary: Negative for hematuria, vaginal bleeding and difficulty urinating.  Musculoskeletal: Positive for arthralgias, back pain and gait problem.  Skin: Negative for rash and wound.  Neurological: Negative for seizures, syncope and headaches.  Hematological: Negative for adenopathy. Does not bruise/bleed easily.  Psychiatric/Behavioral: Negative for confusion.    Blood pressure 150/80, pulse 96, temperature 98.9 F (37.2 C), temperature source Temporal, resp. rate 14, height 5' (1.524 m), weight 183 lb (83.008 kg).  Physical Exam Physical Exam  Constitutional: She is oriented to person, place, and time. She appears well-developed and well-nourished. No distress.  Elderly-appearing female walking with a walker but alert  HENT:  Head: Normocephalic and atraumatic.  Right Ear: External ear normal.  Left Ear: External ear normal.  Nose: Nose normal.  Mouth/Throat: Oropharynx is clear and moist. No oropharyngeal exudate.  Eyes: Conjunctivae are normal. Pupils are equal, round, and reactive to light. Right eye exhibits no discharge. Left eye exhibits no discharge. No scleral icterus.  Neck: Normal range of motion. Neck supple. No tracheal deviation present. No thyromegaly present.  Cardiovascular: Normal rate, regular rhythm, normal heart sounds and intact distal pulses.   No murmur heard. Pulmonary/Chest: Effort normal and breath sounds normal. No respiratory distress. She has no wheezes. She has no rales.  Abdominal: Soft. Bowel sounds are normal. She exhibits no distension. There is  tenderness.  She has multiple well-healed incisions. There is a large parastomal hernia. Her ostomy is pink and well perfused. There is minimal diffuse tenderness  Musculoskeletal: Normal range of motion. She exhibits no edema and no tenderness.  Lymphadenopathy:    She has no cervical adenopathy.  Neurological: She is alert and oriented to person, place, and time.  Skin: Skin is warm and dry. No rash noted. She is not diaphoretic. No erythema.  Psychiatric: Her behavior is normal. Judgment normal.    Data Reviewed I have reviewed her CAT scan of the abdomen and pelvis as well as her laboratory data  Assessment    Splenomegaly with low platelet count     Plan    I believe she is only a moderate risk for splenectomy. She does have cirrhosis, but by her laboratory data, her child's classification is only A or B.  She actually has minimal pulmonary problems at this point as well and recently tolerated an exploratory laparotomy for a bowel obstruction. I discussed the risks of surgery with her. These include but are not limited to bleeding, infection, injury to surrounding structures, prolonged intubation, cardiopulmonary problems, DVT, etc.   I would have her transfused platelets preoperatively the morning of surgery. Without splenectomy, I believe she is at risk of other bleeding episodes and even splenic injury given the massive size of her spleen with any trauma.  After a long discussion, she is eager to proceed with splenectomy       Jaydence Vanyo A 01/12/2013, 11:56 AM

## 2013-01-15 ENCOUNTER — Encounter: Payer: Self-pay | Admitting: Family Medicine

## 2013-01-25 ENCOUNTER — Ambulatory Visit: Payer: Medicare Other | Admitting: Internal Medicine

## 2013-01-28 HISTORY — PX: PACEMAKER INSERTION: SHX728

## 2013-02-02 ENCOUNTER — Encounter: Payer: Self-pay | Admitting: Internal Medicine

## 2013-02-02 ENCOUNTER — Ambulatory Visit (INDEPENDENT_AMBULATORY_CARE_PROVIDER_SITE_OTHER): Payer: Medicare Other | Admitting: Internal Medicine

## 2013-02-02 VITALS — BP 122/64 | HR 83 | Temp 97.8°F | Resp 12 | Wt 185.7 lb

## 2013-02-02 DIAGNOSIS — E1149 Type 2 diabetes mellitus with other diabetic neurological complication: Secondary | ICD-10-CM

## 2013-02-02 MED ORDER — INSULIN LISPRO 100 UNIT/ML ~~LOC~~ SOLN: SUBCUTANEOUS | Status: DC

## 2013-02-02 MED ORDER — INSULIN GLARGINE 100 UNIT/ML ~~LOC~~ SOLN
60.0000 [IU] | Freq: Two times a day (BID) | SUBCUTANEOUS | Status: DC
Start: 2013-02-02 — End: 2013-05-03

## 2013-02-02 NOTE — Patient Instructions (Addendum)
Please return in 1 month with your sugar log.   - Continue Metformin 1000 mg 2x a day - Increase Lantus to 60 units 2x a day - Increase Humalog from 40-35-45 units to 47-35-45 with B'fast-lunch-dinner, respectively  GOOD LUCK WITH YOUR SURGERY!

## 2013-02-02 NOTE — Progress Notes (Signed)
Patient ID: CECYLIA Berry, female   DOB: Aug 28, 1950, 63 y.o.   MRN: [id number]  HPI: [name] is a 63 y.o.-year-old female, returns for f/u for DM2, dx 2004, insulin-dependent since 2006/7 - then off in 2009 - then restarted 08/2012, uncontrolled, with complications (PN, admission for [facility] on 11/14/2012). Last visit 1 mo ago.  She will need to have splenectomy for severe splenomegaly (02/24/2013) , but this is a problem b/c she is high risk for surgery.   Last hemoglobin A1c was: Lab Results  Component Value Date   HGBA1C 8.6* 11/14/2012   HGBA1C 8.7* 11/02/2012   HGBA1C 8.4* 06/29/2012  HbA1c likely not reflecting her DM control as sugars in CBG log are much higher!  Pt is on a regimen of: - Metformin 1000 mg po bid - Lantus 50 units bid - pen - Humalog  40-35-45 units << 35-30-40-units << 25-20-30 units << 20-20-25 units - pens Cop[place] $0 for her insulins.  Pt checks her sugars 4-6x a day and they are: - am: 150-230 >> 101-260 >> 106-322 - 2h after b'fast: 300-499 >> 311-402 - highest sugars of the day >> 275-377 - before lunch: 247-480 >> 221-407 >> 182-381 - 2h after lunch: n/c >> 160 x 1 check >> 246- 302 - before dinner: 231-504 >> 83-328 - 130-448 - bedtime: 280-480 >> 104-435 >> 220-372 No lows. Lowest sugar was 93; she has hypoglycemia awareness at 150. Highest sugar was 540 (when she was out of insulin).  Pt's meals are: - Breakfast: 1/2 bowl old-fashioned oat meal (changed from instant before last visit) - but did not have money to change to something else and tried humus >> did not like it - Lunch: can of soup, or tuna sandwich, or [place] sandwich - Dinner: lasagna, or tuna noodle casserole, or meatloaf, or chicken nuggets + green beans + peas, or mashed potatoes - Snacks: 2 a day: sugar free yoghurt, [place] sausage  She saw nutrition in the hospital >> given a list of foods to try. Diet is a problem for her because she has very poor dentition. She will have 2  teeth pulled soon, and would have dentures placed. She hopes that at that time she can eat better, but for now she needs to eat only soft foods.  - no CKD, last BUN/creatinine:  Lab Results  Component Value Date   BUN 18 11/16/2012   CREATININE 0.96 11/16/2012   - last eye exam was in 04/2012. No DR.  - + numbness and tingling in her feet.  She also has a history of Squamous cell lung cancer, hypertension, COPD, GERD, depression, anemia and thrombocytopenia, cirrhosis, IBS. She had a BM Bx since I last saw her >> no malignancy.  I reviewed pt's medications, allergies, PMH, social hx, family hx and no changes required, except as mentioned above.  ROS: Constitutional: + weight gain, + fatigue, + subjective hyperthermia Eyes: no blurry vision, no xerophthalmia ENT: + sore throat, no nodules palpated in throat, no dysphagia/odynophagia, no hoarseness Cardiovascular: + CP/+ SOB/no palpitations/+ leg swelling Respiratory: + cough/+ SOB Gastrointestinal: + N/no V/+ D (IBS) /no C Musculoskeletal: + muscle aches/+ joint aches Skin: no rashes; + easy bruising, + hair loss Neurological: + tremors/no numbness/tingling/dizziness, + HA  PE: BP 122/64  Pulse 83  Temp(Src) 97.8 F (36.6 C) (Oral)  Resp 12  Wt 185 lb 11.2 oz (84.233 kg)  SpO2 97% Wt Readings from Last 3 Encounters:  02/02/13 185 lb 11.2 oz (84.233  kg)  01/12/13 183 lb (83.008 kg)  01/05/13 184 lb 8 oz (83.689 kg)   Constitutional: obese, pale, in NAD Eyes: PERRLA, EOMI, no exophthalmos ENT: moist mucous membranes, no thyromegaly, no cervical lymphadenopathy Cardiovascular: RRR, No MRG Respiratory: CTA B Gastrointestinal: abdomen soft, NT, ND, BS+ Musculoskeletal: no deformities, strength intact in all 4 Skin: moist, warm  ASSESSMENT: 1. DM2, insulin-dependent, uncontrolled, with complications - PN - [facility] admission  PLAN:  1. Patient with long-standing, recently more uncontrolled diabetes, on basal-bolus insulin  tx + oral antidiabetic regimen, which is insufficient. She in glucotoxic, with high insulin requirements, and we plan to switch to U500 insulin after her splenectomy at the end of this mo. Sugars a little improved in last mo. She s trying to change her diet but has hurdles 2/2 poor finances.  - Highest sugars are before lunch >> - I suggested to:  Patient Instructions  Please return in 1 month with your sugar log.  - Continue Metformin 1000 mg 2x a day - Increase Lantus to 60 units 2x a day - Increase Humalog from 40-35-45 units to 47-35-45 with B'fast-lunch-dinner, respectively  - continue checking sugars at different times of the day - check 4 times a day, rotating checks - she is doing a good job with this, and I gave her new logs - Return to clinic in 1 mo with sugar log

## 2013-02-10 ENCOUNTER — Encounter (HOSPITAL_COMMUNITY): Payer: Self-pay | Admitting: Pharmacy Technician

## 2013-02-11 ENCOUNTER — Other Ambulatory Visit: Payer: Self-pay | Admitting: *Deleted

## 2013-02-11 MED ORDER — INSULIN LISPRO 100 UNIT/ML ~~LOC~~ SOLN: 35.0000 [IU] | Freq: Three times a day (TID) | SUBCUTANEOUS | Status: DC

## 2013-02-15 NOTE — Pre-Procedure Instructions (Addendum)
[name]  02/15/2013   Your procedure is scheduled on: Wednesday, Jan. 28th    Report to [facility] * 3 at 6:30 AM.  Call this number if you have problems the morning of surgery: [phone]   Remember:   Do not eat food or drink liquids after midnight Tuesday.   Take these medicines the morning of surgery with A SIP OF WATER: Valium, Nexium, oxycodone, reglan, Advair, Albuterol inhalers.   Do not wear jewelry, make-up or nail polish.  Do not wear lotions, powders, or perfumes. You may wear deodorant.  Do not shave underarms & legs 48 hours prior to surgery.    Do not bring valuables to the hospital.  [facility] is not responsible for any belongings or valuables.               Contacts, dentures or bridgework may not be worn into surgery.  Leave suitcase in the car. After surgery it may be brought to your room.  For patients admitted to the hospital, discharge time is determined by your treatment team.    Name and phone number of your driver:   Special Instructions: Shower using CHG 2 nights before surgery and the night before surgery.  If you shower the day of surgery use CHG.  Use special wash - you have one bottle of CHG for all showers.  You should use approximately 1/3 of the bottle for each shower.   Please read over the following fact sheets that you were given: Pain Booklet, Blood Transfusion Information and Surgical Site Infection Prevention

## 2013-02-16 ENCOUNTER — Encounter (HOSPITAL_COMMUNITY)
Admission: RE | Admit: 2013-02-16 | Discharge: 2013-02-16 | Disposition: A | Discharge status: Home / Self Care | Payer: Medicare Other | Source: Ambulatory Visit | Attending: Surgery | Admitting: Surgery

## 2013-02-16 ENCOUNTER — Encounter (HOSPITAL_COMMUNITY): Payer: Self-pay

## 2013-02-16 DIAGNOSIS — Z01818 Encounter for other preprocedural examination: Secondary | ICD-10-CM | POA: Insufficient documentation

## 2013-02-16 DIAGNOSIS — Z01812 Encounter for preprocedural laboratory examination: Secondary | ICD-10-CM | POA: Insufficient documentation

## 2013-02-16 HISTORY — DX: Encounter for attention to colostomy: Z43.3

## 2013-02-16 LAB — COMPREHENSIVE METABOLIC PANEL
ALT: 39 U/L — ABNORMAL HIGH (ref 0–35)
AST: 52 U/L — ABNORMAL HIGH (ref 0–37)
Albumin: 3.8 g/dL (ref 3.5–5.2)
Alkaline Phosphatase: 70 U/L (ref 39–117)
BUN: 20 mg/dL (ref 6–23)
CALCIUM: 10.6 mg/dL — AB (ref 8.4–10.5)
CO2: 21 mEq/L (ref 19–32)
Chloride: 98 mEq/L (ref 96–112)
Creatinine, Ser: 0.98 mg/dL (ref 0.50–1.10)
GFR, EST AFRICAN AMERICAN: 71 mL/min — AB (ref >90)
GFR, EST NON AFRICAN AMERICAN: 61 mL/min — AB (ref >90)
Glucose, Bld: 276 mg/dL — ABNORMAL HIGH (ref 70–99)
Potassium: 4.4 mEq/L (ref 3.7–5.3)
SODIUM: 138 meq/L (ref 137–147)
Total Bilirubin: 0.4 mg/dL (ref 0.3–1.2)
Total Protein: 7.6 g/dL (ref 6.0–8.3)

## 2013-02-16 LAB — CBC WITH DIFFERENTIAL/PLATELET
Basophils Absolute: 0 10*3/uL (ref 0.0–0.1)
Basophils Relative: 1 % (ref 0–1)
Eosinophils Absolute: 0.1 10*3/uL (ref 0.0–0.7)
Eosinophils Relative: 3 % (ref 0–5)
HCT: 31.1 % — ABNORMAL LOW (ref 36.0–46.0)
Hemoglobin: 10.4 g/dL — ABNORMAL LOW (ref 12.0–15.0)
Lymphocytes Relative: 16 % (ref 12–46)
Lymphs Abs: 0.5 10*3/uL — ABNORMAL LOW (ref 0.7–4.0)
MCH: 29.2 pg (ref 26.0–34.0)
MCHC: 33.4 g/dL (ref 30.0–36.0)
MCV: 87.4 fL (ref 78.0–100.0)
Monocytes Absolute: 0.3 10*3/uL (ref 0.1–1.0)
Monocytes Relative: 9 % (ref 3–12)
Neutro Abs: 2.4 10*3/uL (ref 1.7–7.7)
Neutrophils Relative %: 71 % (ref 43–77)
Platelets: 50 10*3/uL — ABNORMAL LOW (ref 150–400)
RBC: 3.56 MIL/uL — ABNORMAL LOW (ref 3.87–5.11)
RDW: 13.7 % (ref 11.5–15.5)
WBC: 3.3 10*3/uL — ABNORMAL LOW (ref 4.0–10.5)

## 2013-02-16 LAB — APTT: aPTT: 28 seconds (ref 24–37)

## 2013-02-16 LAB — PROTIME-INR
INR: 1.12 (ref 0.00–1.49)
Prothrombin Time: 14.2 seconds (ref 11.6–15.2)

## 2013-02-17 NOTE — Progress Notes (Signed)
Anesthesia Chart Review:  Patient is a 63 year old female scheduled for splenectomy 02/24/13 by Dr. Coralie Keens.    History includes drug induced liver cirrhosis with splenomegaly and thrombocytopenia, anemia, IBS, diverticulitis with perforated colon s/p bowel resection with colostomy  '07 with parastomal hernia with surgery for bowel obstruction in [place], [place] '13, former smoker, stage 1B lung cancer s/p LU lobectomy '04, cervical cancer, COPD, OSA with night time O2 but no CPAP,asthma, heart murmur (only trivial TR by 2007 echo), DM2, HLD, GERD. Dr. Ninfa Linden feels she is moderate risk.  HEM-ONC is Dr. Julien Nordmann who referred patient for splenomegaly.  PCP is [name]. Endocrinologist is Dr. Cruzita Lederer who saw patient earlier this month.  She was aware of plans for surgery and increased her insulin regimen. Pulmonologist is Dr. Gwenette Greet.  EKG on 11/14/12 showed NSR. She had an unremarkable stress and echo in 2007.  CXR on 11/13/12 showed no acute cardiopulmonary process.  Preoperative labs noted. WBC 3.3, H/H 10.4/31.1, PLT 50K (previously 38-46K since 10/2012).  Non-fasting glucose is 276.  Cr 0.98.  AST/ALT 52/39. Her CBC results are as expected. A T&S has already been done.  I have routed CBC results to Dr. Ninfa Linden and will defer any order for platelets to him or patient's assigned anesthesiologist. She will get a fasting CBG on arrival.  [facility] Short Stay Center/Anesthesiology Phone [phone] 02/17/2013 12:13 PM

## 2013-02-18 ENCOUNTER — Other Ambulatory Visit (INDEPENDENT_AMBULATORY_CARE_PROVIDER_SITE_OTHER): Payer: Self-pay | Admitting: Surgery

## 2013-02-22 ENCOUNTER — Other Ambulatory Visit (INDEPENDENT_AMBULATORY_CARE_PROVIDER_SITE_OTHER): Payer: Self-pay | Admitting: Surgery

## 2013-02-23 MED ORDER — CIPROFLOXACIN IN D5W 400 MG/200ML IV SOLN
400.0000 mg | INTRAVENOUS | Status: AC
  Administered 2013-02-24: 400 mg via INTRAVENOUS
  Filled 2013-02-23: qty 200

## 2013-02-23 NOTE — H&P (Signed)
Chief Complaint   Patient presents with   .  New Evaluation     eval for spleenectomy   HPI  [name] is a 63 y.o. female.  HPI  This is a pleasant female referred by Dr. Julien Nordmann and Dr. Delma Freeze for evaluation of splenomegaly and thrombocytopenia. She has had a massively enlarged spleen and low platelets for some time. She has a very complex medical history including previous left upper lobectomy for lung cancer, emergent colon resection after perforation from colostomy, parastomal hernia, and most recently, surgery in [place] last year for a bowel obstruction. She reports that the surgery for the bowel obstruction she was able to extubate in the operating room and was only in the hospital for approximately 5 days. She currently has chronic abdominal pain. She bruises very easily. She has difficulty stopping bleeding. She is only on oxygen at night when sleeping for sleep apnea. She has no shortness of breath when ambulating.  Past Medical History   Diagnosis  Date   .  Cirrhosis    .  GERD (gastroesophageal reflux disease)    .  Cervical disc syndrome    .  Lung cancer      squamous cell   .  Chronic respiratory failure    .  Diverticulitis of colon    .  Perforation of colon    .  Arthritis    .  IBS (irritable bowel syndrome)    .  Chronic lower GI bleeding    .  Overactive bladder    .  Cervical cancer    .  Thrombocytopenia      sees Dr. Julien Nordmann   .  Splenomegaly    .  Anxiety    .  Depression    .  Hypertension    .  Asthma    .  Chronic headache disorder    .  Blood transfusion without reported diagnosis    .  Heart murmur    .  Hyperlipidemia    .  Lung cancer    .  COPD (chronic obstructive pulmonary disease)      sees Dr. Gwenette Greet   .  Anemia      sees Dr. Julien Nordmann, due to chronic disease and GI losses   .  Diabetes mellitus      sees Dr. Cruzita Lederer    Past Surgical History   Procedure  Laterality  Date   .  Appendectomy     .   Cholecystectomy     .  Colostomy     .  Pneumonectomy     .  Cervix removed     .  Bowel resection     .  Esophagogastroduodenoscopy   03/19/2011     Procedure: ESOPHAGOGASTRODUODENOSCOPY (EGD); Surgeon: [name], MD; Location: [facility]; Service: Endoscopy; Laterality: N/A;   .  Colonoscopy   03/19/2011     Procedure: COLONOSCOPY; Surgeon: [name], MD; Location: [facility]; Service: Endoscopy; Laterality: N/A;   .  Givens capsule study   03/20/2011     Procedure: GIVENS CAPSULE STUDY; Surgeon: [name], MD; Location: [facility]; Service: Endoscopy; Laterality: N/A;   .  Small bowel obstruction repair   March 2012   .  Umbilical hernia repair   March 2012    Family History   Problem  Relation  Age of Onset   .  Coronary artery disease     .  Diabetes type  II     .  Anesthesia problems  Neg Hx    .  Hypotension  Neg Hx    .  Malignant hyperthermia  Neg Hx    .  Pseudochol deficiency  Neg Hx    .  Heart attack  Mother    .  Diabetes type II  Mother    .  Cirrhosis  Father    Social History  History   Substance Use Topics   .  Smoking status:  Former Smoker -- 2.00 packs/day for 35 years     Types:  Cigarettes     Quit date:  03/24/2002   .  Smokeless tobacco:  Never Used   .  Alcohol Use:  No    Allergies   Allergen  Reactions   .  Codeine Phosphate      Stomach cramps   .  Hydrocodone-Acetaminophen      hallucinations   .  Morphine      Lowers BP   .  Penicillins      Rash, tongue swelling   .  Cephalexin  Swelling and Rash    Current Outpatient Prescriptions   Medication  Sig  Dispense  Refill   .  albuterol-ipratropium (COMBIVENT) 18-103 MCG/ACT inhaler  Inhale 2 puffs into the lungs every 4 (four) hours as needed for wheezing or shortness of breath. For asthma     .  cholecalciferol (VITAMIN D) 1000 UNITS tablet  Take 1,000 Units by mouth daily.     .  diazepam (VALIUM) 5 MG tablet  Take 1 tablet (5 mg total) by mouth 3 (three) times  daily. For anxiety  90 tablet  5   .  dicyclomine (BENTYL) 10 MG capsule  Take 1 capsule (10 mg total) by mouth 3 (three) times daily.  90 capsule  11   .  esomeprazole (NEXIUM) 40 MG capsule  Take 40 mg by mouth every evening.     .  Fluticasone-Salmeterol (ADVAIR) 250-50 MCG/DOSE AEPB  Inhale 1 puff into the lungs every 12 (twelve) hours.  60 each  11   .  folic acid (FOLVITE) 1 MG tablet  Take 1 mg by mouth every evening.     .  furosemide (LASIX) 20 MG tablet  Take 20 mg by mouth daily.     .  insulin glargine (LANTUS) 100 UNIT/ML injection  Inject 0.5 mLs (50 Units total) into the skin 2 (two) times daily.  30 mL  12   .  insulin lispro (HUMALOG) 100 UNIT/ML injection  Inject under skin 40-35-45 units with breakfast-lunch-dinner, respectively.  10 mL  0   .  Insulin Pen Needle (BD PEN NEEDLE NANO U/F) 32G X 4 MM MISC  Use 4 times daily as directed.  150 each  3   .  Insulin Pen Needle (EASY TOUCH PEN NEEDLES) 32G X 4 MM MISC  Use as directed, diagnosis code is 250.00  100 each  3   .  metFORMIN (GLUCOPHAGE) 1000 MG tablet  Take 1,000 mg by mouth 2 (two) times daily with a meal.     .  metoCLOPramide (REGLAN) 10 MG tablet  Take 10 mg by mouth 3 (three) times daily.     .  Oxycodone HCl 20 MG TABS  Take 1 tablet (20 mg total) by mouth every 3 (three) hours as needed (pain).  240 tablet  0   .  potassium chloride (MICRO-K) 10 MEQ CR capsule  Take 1 capsule (  10 mEq total) by mouth daily.  90 capsule  3   .  promethazine (PHENERGAN) 25 MG tablet  Take 25 mg by mouth every 6 (six) hours as needed for nausea. For nausea     .  rifaximin (XIFAXAN) 550 MG TABS tablet  Take 550 mg by mouth 2 (two) times daily.     [name]  tolterodine (DETROL) 2 MG tablet  Take 2 mg by mouth daily.     .  traZODone (DESYREL) 100 MG tablet  Take 2 tablets (200 mg total) by mouth at bedtime.  60 tablet  11    No current facility-administered medications for this visit.   Review of Systems  Review of Systems  Constitutional:  Negative for fever, chills and unexpected weight change.  HENT: Negative for congestion, hearing loss, sore throat, trouble swallowing and voice change.  Eyes: Negative for visual disturbance.  Respiratory: Negative for cough, shortness of breath and wheezing.  Cardiovascular: Negative for chest pain, palpitations and leg swelling.  Gastrointestinal: Positive for abdominal pain, blood in stool and abdominal distention. Negative for nausea, vomiting, diarrhea, constipation and anal bleeding.  Genitourinary: Negative for hematuria, vaginal bleeding and difficulty urinating.  Musculoskeletal: Positive for arthralgias, back pain and gait problem.  Skin: Negative for rash and wound.  Neurological: Negative for seizures, syncope and headaches.  Hematological: Negative for adenopathy. Does not bruise/bleed easily.  Psychiatric/Behavioral: Negative for confusion.  Blood pressure 150/80, pulse 96, temperature 98.9 F (37.2 C), temperature source Temporal, resp. rate 14, height 5' (1.524 m), weight 183 lb (83.008 kg).  Physical Exam  Physical Exam  Constitutional: She is oriented to person, place, and time. She appears well-developed and well-nourished. No distress.  Elderly-appearing female walking with a walker but alert  HENT:  Head: Normocephalic and atraumatic.  Right Ear: External ear normal.  Left Ear: External ear normal.  Nose: Nose normal.  Mouth/Throat: Oropharynx is clear and moist. No oropharyngeal exudate.  Eyes: Conjunctivae are normal. Pupils are equal, round, and reactive to light. Right eye exhibits no discharge. Left eye exhibits no discharge. No scleral icterus.  Neck: Normal range of motion. Neck supple. No tracheal deviation present. No thyromegaly present.  Cardiovascular: Normal rate, regular rhythm, normal heart sounds and intact distal pulses.  No murmur heard.  Pulmonary/Chest: Effort normal and breath sounds normal. No respiratory distress. She has no wheezes. She has no  rales.  Abdominal: Soft. Bowel sounds are normal. She exhibits no distension. There is tenderness.  She has multiple well-healed incisions. There is a large parastomal hernia. Her ostomy is pink and well perfused. There is minimal diffuse tenderness  Musculoskeletal: Normal range of motion. She exhibits no edema and no tenderness.  Lymphadenopathy:  She has no cervical adenopathy.  Neurological: She is alert and oriented to person, place, and time.  Skin: Skin is warm and dry. No rash noted. She is not diaphoretic. No erythema.  Psychiatric: Her behavior is normal. Judgment normal.   Data Reviewed  I have reviewed her CAT scan of the abdomen and pelvis as well as her laboratory data   Assessment  Splenomegaly with low platelet count   Plan  I believe she is only a moderate risk for splenectomy. She does have cirrhosis, but by her laboratory data, her child's classification is only A or B. She actually has minimal pulmonary problems at this point as well and recently tolerated an exploratory laparotomy for a bowel obstruction. I discussed the risks of surgery with her. These include  but are not limited to bleeding, infection, injury to surrounding structures, prolonged intubation, cardiopulmonary problems, DVT, etc. I would have her transfused platelets preoperatively the morning of surgery. Without splenectomy, I believe she is at risk of other bleeding episodes and even splenic injury given the massive size of her spleen with any trauma. After a long discussion, she is eager to proceed with splenectomy

## 2013-02-24 ENCOUNTER — Encounter (HOSPITAL_COMMUNITY): Payer: Self-pay | Admitting: *Deleted

## 2013-02-24 ENCOUNTER — Encounter (HOSPITAL_COMMUNITY)
Admission: RE | Disposition: A | Discharge status: Home / Self Care | Payer: Self-pay | Source: Ambulatory Visit | Attending: Surgery

## 2013-02-24 ENCOUNTER — Inpatient Hospital Stay (HOSPITAL_COMMUNITY): Payer: Medicare Other | Admitting: Vascular Surgery

## 2013-02-24 ENCOUNTER — Encounter (HOSPITAL_COMMUNITY): Payer: Medicare Other | Admitting: Vascular Surgery

## 2013-02-24 ENCOUNTER — Inpatient Hospital Stay (HOSPITAL_COMMUNITY)
Admission: RE | Admit: 2013-02-24 | Discharge: 2013-03-08 | DRG: 799 | Disposition: A | Discharge status: Home / Self Care | Payer: Medicare Other | Source: Ambulatory Visit | Attending: Surgery | Admitting: Surgery

## 2013-02-24 DIAGNOSIS — Z87891 Personal history of nicotine dependence: Secondary | ICD-10-CM

## 2013-02-24 DIAGNOSIS — D6959 Other secondary thrombocytopenia: Secondary | ICD-10-CM | POA: Diagnosis present

## 2013-02-24 DIAGNOSIS — J449 Chronic obstructive pulmonary disease, unspecified: Secondary | ICD-10-CM | POA: Diagnosis present

## 2013-02-24 DIAGNOSIS — I81 Portal vein thrombosis: Secondary | ICD-10-CM | POA: Diagnosis present

## 2013-02-24 DIAGNOSIS — K7460 Unspecified cirrhosis of liver: Secondary | ICD-10-CM | POA: Diagnosis present

## 2013-02-24 DIAGNOSIS — Z9981 Dependence on supplemental oxygen: Secondary | ICD-10-CM

## 2013-02-24 DIAGNOSIS — E785 Hyperlipidemia, unspecified: Secondary | ICD-10-CM | POA: Diagnosis present

## 2013-02-24 DIAGNOSIS — D696 Thrombocytopenia, unspecified: Secondary | ICD-10-CM

## 2013-02-24 DIAGNOSIS — Z933 Colostomy status: Secondary | ICD-10-CM

## 2013-02-24 DIAGNOSIS — Z85118 Personal history of other malignant neoplasm of bronchus and lung: Secondary | ICD-10-CM

## 2013-02-24 DIAGNOSIS — Z8541 Personal history of malignant neoplasm of cervix uteri: Secondary | ICD-10-CM

## 2013-02-24 DIAGNOSIS — E119 Type 2 diabetes mellitus without complications: Secondary | ICD-10-CM | POA: Diagnosis present

## 2013-02-24 DIAGNOSIS — G4730 Sleep apnea, unspecified: Secondary | ICD-10-CM | POA: Diagnosis present

## 2013-02-24 DIAGNOSIS — N179 Acute kidney failure, unspecified: Secondary | ICD-10-CM

## 2013-02-24 DIAGNOSIS — R188 Other ascites: Secondary | ICD-10-CM | POA: Diagnosis present

## 2013-02-24 DIAGNOSIS — J9610 Chronic respiratory failure, unspecified whether with hypoxia or hypercapnia: Secondary | ICD-10-CM | POA: Diagnosis present

## 2013-02-24 DIAGNOSIS — Z794 Long term (current) use of insulin: Secondary | ICD-10-CM

## 2013-02-24 DIAGNOSIS — R161 Splenomegaly, not elsewhere classified: Principal | ICD-10-CM | POA: Diagnosis present

## 2013-02-24 DIAGNOSIS — J4489 Other specified chronic obstructive pulmonary disease: Secondary | ICD-10-CM | POA: Diagnosis present

## 2013-02-24 DIAGNOSIS — I10 Essential (primary) hypertension: Secondary | ICD-10-CM | POA: Diagnosis present

## 2013-02-24 DIAGNOSIS — Z79899 Other long term (current) drug therapy: Secondary | ICD-10-CM

## 2013-02-24 DIAGNOSIS — J189 Pneumonia, unspecified organism: Secondary | ICD-10-CM | POA: Diagnosis not present

## 2013-02-24 DIAGNOSIS — D649 Anemia, unspecified: Secondary | ICD-10-CM

## 2013-02-24 DIAGNOSIS — Z880 Allergy status to penicillin: Secondary | ICD-10-CM

## 2013-02-24 DIAGNOSIS — D62 Acute posthemorrhagic anemia: Secondary | ICD-10-CM | POA: Diagnosis not present

## 2013-02-24 DIAGNOSIS — K219 Gastro-esophageal reflux disease without esophagitis: Secondary | ICD-10-CM | POA: Diagnosis present

## 2013-02-24 HISTORY — PX: SPLENECTOMY, TOTAL: SHX788

## 2013-02-24 LAB — DIC (DISSEMINATED INTRAVASCULAR COAGULATION)PANEL
Fibrinogen: 192 mg/dL — ABNORMAL LOW (ref 204–475)
Platelets: 76 10*3/uL — ABNORMAL LOW (ref 150–400)
Smear Review: NONE SEEN
aPTT: 29 seconds (ref 24–37)

## 2013-02-24 LAB — CBC
HCT: 32.8 % — ABNORMAL LOW (ref 36.0–46.0)
HEMATOCRIT: 29 % — AB (ref 36.0–46.0)
HEMATOCRIT: 31.9 % — AB (ref 36.0–46.0)
HEMOGLOBIN: 9.8 g/dL — AB (ref 12.0–15.0)
HEMOGLOBIN: 11.2 g/dL — AB (ref 12.0–15.0)
Hemoglobin: 11.1 g/dL — ABNORMAL LOW (ref 12.0–15.0)
MCH: 30.1 pg (ref 26.0–34.0)
MCH: 29.6 pg (ref 26.0–34.0)
MCH: 29.9 pg (ref 26.0–34.0)
MCHC: 33.8 g/dL (ref 30.0–36.0)
MCHC: 34.1 g/dL (ref 30.0–36.0)
MCHC: 34.8 g/dL (ref 30.0–36.0)
MCV: 89 fL (ref 78.0–100.0)
MCV: 86.8 fL (ref 78.0–100.0)
MCV: 86 fL (ref 78.0–100.0)
Platelets: 76 10*3/uL — ABNORMAL LOW (ref 150–400)
Platelets: 86 10*3/uL — ABNORMAL LOW (ref 150–400)
Platelets: 82 10*3/uL — ABNORMAL LOW (ref 150–400)
RBC: 3.26 MIL/uL — ABNORMAL LOW (ref 3.87–5.11)
RBC: 3.78 MIL/uL — ABNORMAL LOW (ref 3.87–5.11)
RBC: 3.71 MIL/uL — ABNORMAL LOW (ref 3.87–5.11)
RDW: 14.2 % (ref 11.5–15.5)
RDW: 14.1 % (ref 11.5–15.5)
RDW: 14.3 % (ref 11.5–15.5)
WBC: 5.2 10*3/uL (ref 4.0–10.5)
WBC: 13.5 10*3/uL — ABNORMAL HIGH (ref 4.0–10.5)
WBC: 19.7 10*3/uL — ABNORMAL HIGH (ref 4.0–10.5)

## 2013-02-24 LAB — PROTIME-INR
INR: 1.23 (ref 0.00–1.49)
PROTHROMBIN TIME: 15.2 s (ref 11.6–15.2)

## 2013-02-24 LAB — COMPREHENSIVE METABOLIC PANEL
ALBUMIN: 3.1 g/dL — AB (ref 3.5–5.2)
ALT: 47 U/L — AB (ref 0–35)
AST: 63 U/L — AB (ref 0–37)
Alkaline Phosphatase: 53 U/L (ref 39–117)
BILIRUBIN TOTAL: 1.3 mg/dL — AB (ref 0.3–1.2)
BUN: 21 mg/dL (ref 6–23)
CHLORIDE: 102 meq/L (ref 96–112)
CO2: 19 mEq/L (ref 19–32)
Calcium: 8.5 mg/dL (ref 8.4–10.5)
Creatinine, Ser: 1.05 mg/dL (ref 0.50–1.10)
GFR calc Af Amer: 65 mL/min — ABNORMAL LOW (ref >90)
GFR calc non Af Amer: 56 mL/min — ABNORMAL LOW (ref >90)
Glucose, Bld: 340 mg/dL — ABNORMAL HIGH (ref 70–99)
Potassium: 5.1 mEq/L (ref 3.7–5.3)
Sodium: 138 mEq/L (ref 137–147)
TOTAL PROTEIN: 5.6 g/dL — AB (ref 6.0–8.3)

## 2013-02-24 LAB — BLOOD PRODUCT ORDER (VERBAL) VERIFICATION

## 2013-02-24 LAB — POCT I-STAT 7, (LYTES, BLD GAS, ICA,H+H)
ACID-BASE DEFICIT: 7 mmol/L — AB (ref 0.0–2.0)
Bicarbonate: 18.9 mEq/L — ABNORMAL LOW (ref 20.0–24.0)
Calcium, Ion: 0.88 mmol/L — ABNORMAL LOW (ref 1.13–1.30)
HEMATOCRIT: 28 % — AB (ref 36.0–46.0)
HEMOGLOBIN: 9.5 g/dL — AB (ref 12.0–15.0)
O2 Saturation: 100 %
POTASSIUM: 4.3 meq/L (ref 3.7–5.3)
SODIUM: 138 meq/L (ref 137–147)
TCO2: 20 mmol/L (ref 0–100)
pCO2 arterial: 39.5 mmHg (ref 35.0–45.0)
pH, Arterial: 7.287 — ABNORMAL LOW (ref 7.350–7.450)
pO2, Arterial: 519 mmHg — ABNORMAL HIGH (ref 80.0–100.0)

## 2013-02-24 LAB — GLUCOSE, CAPILLARY
GLUCOSE-CAPILLARY: 194 mg/dL — AB (ref 70–99)
GLUCOSE-CAPILLARY: 201 mg/dL — AB (ref 70–99)
Glucose-Capillary: 279 mg/dL — ABNORMAL HIGH (ref 70–99)
Glucose-Capillary: 331 mg/dL — ABNORMAL HIGH (ref 70–99)
Glucose-Capillary: 348 mg/dL — ABNORMAL HIGH (ref 70–99)

## 2013-02-24 LAB — MRSA PCR SCREENING: MRSA by PCR: NEGATIVE

## 2013-02-24 LAB — BLOOD GAS, ARTERIAL
ACID-BASE DEFICIT: 3.4 mmol/L — AB (ref 0.0–2.0)
Bicarbonate: 22 mEq/L (ref 20.0–24.0)
O2 CONTENT: 10 L/min
O2 Saturation: 99.9 %
PCO2 ART: 45 mmHg (ref 35.0–45.0)
PH ART: 7.307 — AB (ref 7.350–7.450)
PO2 ART: 231 mmHg — AB (ref 80.0–100.0)
Patient temperature: 97.8
TCO2: 23.4 mmol/L (ref 0–100)

## 2013-02-24 LAB — PREPARE RBC (CROSSMATCH)

## 2013-02-24 LAB — DIC (DISSEMINATED INTRAVASCULAR COAGULATION) PANEL
D DIMER QUANT: 2.21 ug{FEU}/mL — AB (ref 0.00–0.48)
INR: 1.43 (ref 0.00–1.49)
Prothrombin Time: 17.1 seconds — ABNORMAL HIGH (ref 11.6–15.2)

## 2013-02-24 SURGERY — SPLENECTOMY
Anesthesia: General | Site: Abdomen

## 2013-02-24 MED ORDER — IPRATROPIUM-ALBUTEROL 0.5-2.5 (3) MG/3ML IN SOLN: 3.0000 mL | RESPIRATORY_TRACT | Status: DC | PRN

## 2013-02-24 MED ORDER — OXYCODONE HCL 5 MG/5ML PO SOLN
5.0000 mg | Freq: Once | ORAL | Status: DC | PRN
Start: 2013-02-24 — End: 2013-02-24

## 2013-02-24 MED ORDER — MIDAZOLAM HCL 2 MG/2ML IJ SOLN
INTRAMUSCULAR | Status: AC
  Filled 2013-02-24: qty 2

## 2013-02-24 MED ORDER — PROPOFOL 10 MG/ML IV BOLUS
INTRAVENOUS | Status: DC | PRN
  Administered 2013-02-24: 100 mg via INTRAVENOUS

## 2013-02-24 MED ORDER — ONDANSETRON HCL 4 MG/2ML IJ SOLN: 4.0000 mg | Freq: Four times a day (QID) | INTRAMUSCULAR | Status: DC | PRN

## 2013-02-24 MED ORDER — LACTATED RINGERS IV SOLN
INTRAVENOUS | Status: DC | PRN
  Administered 2013-02-24 (×2): via INTRAVENOUS

## 2013-02-24 MED ORDER — ONDANSETRON HCL 4 MG/2ML IJ SOLN
4.0000 mg | Freq: Once | INTRAMUSCULAR | Status: AC | PRN
  Administered 2013-02-24: 4 mg via INTRAVENOUS

## 2013-02-24 MED ORDER — HEMOSTATIC AGENTS (NO CHARGE) OPTIME
TOPICAL | Status: DC | PRN
  Administered 2013-02-24 (×2): 1 via TOPICAL

## 2013-02-24 MED ORDER — NEOSTIGMINE METHYLSULFATE 1 MG/ML IJ SOLN
INTRAMUSCULAR | Status: AC
  Filled 2013-02-24: qty 10

## 2013-02-24 MED ORDER — PANTOPRAZOLE SODIUM 40 MG IV SOLR
40.0000 mg | Freq: Every day | INTRAVENOUS | Status: DC
  Administered 2013-02-24 – 2013-02-28 (×5): 40 mg via INTRAVENOUS
  Filled 2013-02-24 (×6): qty 40

## 2013-02-24 MED ORDER — ALBUMIN HUMAN 5 % IV SOLN
INTRAVENOUS | Status: DC | PRN
  Administered 2013-02-24: 09:00:00 via INTRAVENOUS

## 2013-02-24 MED ORDER — ARTIFICIAL TEARS OP OINT
TOPICAL_OINTMENT | OPHTHALMIC | Status: DC | PRN
  Administered 2013-02-24: 1 via OPHTHALMIC

## 2013-02-24 MED ORDER — HYDROMORPHONE 0.3 MG/ML IV SOLN
INTRAVENOUS | Status: AC
  Filled 2013-02-24: qty 25

## 2013-02-24 MED ORDER — DIPHENHYDRAMINE HCL 12.5 MG/5ML PO ELIX
12.5000 mg | ORAL_SOLUTION | Freq: Four times a day (QID) | ORAL | Status: DC | PRN
  Filled 2013-02-24: qty 5

## 2013-02-24 MED ORDER — DIPHENHYDRAMINE HCL 50 MG/ML IJ SOLN: 12.5000 mg | Freq: Four times a day (QID) | INTRAMUSCULAR | Status: DC | PRN

## 2013-02-24 MED ORDER — SUCCINYLCHOLINE CHLORIDE 20 MG/ML IJ SOLN
INTRAMUSCULAR | Status: AC
  Filled 2013-02-24: qty 1

## 2013-02-24 MED ORDER — ROCURONIUM BROMIDE 50 MG/5ML IV SOLN
INTRAVENOUS | Status: AC
  Filled 2013-02-24: qty 1

## 2013-02-24 MED ORDER — HYDROMORPHONE 0.3 MG/ML IV SOLN
INTRAVENOUS | Status: DC
Start: 2013-02-24 — End: 2013-02-28
  Administered 2013-02-24: 1.39 mg via INTRAVENOUS
  Administered 2013-02-24: 2.39 mg via INTRAVENOUS
  Administered 2013-02-24: 11:00:00 via INTRAVENOUS
  Administered 2013-02-25: 2.35 mg via INTRAVENOUS
  Administered 2013-02-25 (×2): via INTRAVENOUS
  Administered 2013-02-25: 1.79 mg via INTRAVENOUS
  Administered 2013-02-25: 2.07 mg via INTRAVENOUS
  Administered 2013-02-25: 0.6 mg via INTRAVENOUS
  Administered 2013-02-25: 3.19 mg via INTRAVENOUS
  Administered 2013-02-25: 1.66 mg via INTRAVENOUS
  Administered 2013-02-26: 0.999 mg via INTRAVENOUS
  Administered 2013-02-26: 09:00:00 via INTRAVENOUS
  Administered 2013-02-26: 2.1 mg via INTRAVENOUS
  Administered 2013-02-26: 1.39 mg via INTRAVENOUS
  Administered 2013-02-26: 2.3 mg via INTRAVENOUS
  Administered 2013-02-26: 1.99 mg via INTRAVENOUS
  Administered 2013-02-26: 2.79 mg via INTRAVENOUS
  Administered 2013-02-27: 2.19 mg via INTRAVENOUS
  Administered 2013-02-27: via INTRAVENOUS
  Administered 2013-02-27: 1.99 mg via INTRAVENOUS
  Administered 2013-02-27: 1.79 mg via INTRAVENOUS
  Administered 2013-02-27: 13:00:00 via INTRAVENOUS
  Administered 2013-02-27: 2.59 mg via INTRAVENOUS
  Administered 2013-02-27: 2.19 mg via INTRAVENOUS
  Administered 2013-02-27: via INTRAVENOUS
  Administered 2013-02-27: 2.86 mg via INTRAVENOUS
  Administered 2013-02-28: 4.79 mg via INTRAVENOUS
  Administered 2013-02-28: 2.79 mg via INTRAVENOUS
  Filled 2013-02-24 (×6): qty 25

## 2013-02-24 MED ORDER — LIDOCAINE HCL (CARDIAC) 20 MG/ML IV SOLN
INTRAVENOUS | Status: AC
  Filled 2013-02-24: qty 5

## 2013-02-24 MED ORDER — INSULIN ASPART 100 UNIT/ML ~~LOC~~ SOLN
0.0000 [IU] | SUBCUTANEOUS | Status: DC
  Administered 2013-02-24: 11 [IU] via SUBCUTANEOUS
  Administered 2013-02-24 (×2): 15 [IU] via SUBCUTANEOUS
  Administered 2013-02-25 (×4): 4 [IU] via SUBCUTANEOUS
  Administered 2013-02-25: 3 [IU] via SUBCUTANEOUS
  Administered 2013-02-25: 7 [IU] via SUBCUTANEOUS
  Administered 2013-02-26 (×4): 4 [IU] via SUBCUTANEOUS
  Administered 2013-02-26: 7 [IU] via SUBCUTANEOUS
  Administered 2013-02-26: 4 [IU] via SUBCUTANEOUS
  Administered 2013-02-27 (×2): 3 [IU] via SUBCUTANEOUS
  Administered 2013-02-27: 4 [IU] via SUBCUTANEOUS

## 2013-02-24 MED ORDER — GLYCOPYRROLATE 0.2 MG/ML IJ SOLN
INTRAMUSCULAR | Status: DC | PRN
  Administered 2013-02-24 (×2): 0.1 mg via INTRAVENOUS
  Administered 2013-02-24: 0.6 mg via INTRAVENOUS

## 2013-02-24 MED ORDER — ONDANSETRON HCL 4 MG/2ML IJ SOLN
INTRAMUSCULAR | Status: AC
  Filled 2013-02-24: qty 2

## 2013-02-24 MED ORDER — ALBUTEROL SULFATE HFA 108 (90 BASE) MCG/ACT IN AERS
INHALATION_SPRAY | RESPIRATORY_TRACT | Status: AC
  Filled 2013-02-24: qty 6.7

## 2013-02-24 MED ORDER — FENTANYL CITRATE 0.05 MG/ML IJ SOLN
INTRAMUSCULAR | Status: AC
  Filled 2013-02-24: qty 5

## 2013-02-24 MED ORDER — FENTANYL CITRATE 0.05 MG/ML IJ SOLN
INTRAMUSCULAR | Status: DC | PRN
  Administered 2013-02-24 (×4): 50 ug via INTRAVENOUS

## 2013-02-24 MED ORDER — LIDOCAINE HCL (CARDIAC) 20 MG/ML IV SOLN
INTRAVENOUS | Status: DC | PRN
  Administered 2013-02-24: 100 mg via INTRAVENOUS

## 2013-02-24 MED ORDER — MEPERIDINE HCL 25 MG/ML IJ SOLN: 6.2500 mg | INTRAMUSCULAR | Status: DC | PRN

## 2013-02-24 MED ORDER — HYDROMORPHONE HCL PF 1 MG/ML IJ SOLN
INTRAMUSCULAR | Status: AC
  Administered 2013-02-24: 0.25 mg via INTRAVENOUS
  Filled 2013-02-24: qty 1

## 2013-02-24 MED ORDER — 0.9 % SODIUM CHLORIDE (POUR BTL) OPTIME
TOPICAL | Status: DC | PRN
  Administered 2013-02-24: 2000 mL
  Administered 2013-02-24: 1000 mL

## 2013-02-24 MED ORDER — PHENYLEPHRINE HCL 10 MG/ML IJ SOLN
10.0000 mg | INTRAVENOUS | Status: DC | PRN
  Administered 2013-02-24: 100 ug/min via INTRAVENOUS

## 2013-02-24 MED ORDER — MOMETASONE FURO-FORMOTEROL FUM 100-5 MCG/ACT IN AERO
2.0000 | INHALATION_SPRAY | Freq: Two times a day (BID) | RESPIRATORY_TRACT | Status: DC
  Administered 2013-02-24 – 2013-03-01 (×9): 2 via RESPIRATORY_TRACT
  Filled 2013-02-24 (×2): qty 8.8

## 2013-02-24 MED ORDER — KCL IN DEXTROSE-NACL 20-5-0.9 MEQ/L-%-% IV SOLN
INTRAVENOUS | Status: DC
Start: 2013-02-24 — End: 2013-02-24
  Administered 2013-02-24: 13:00:00 via INTRAVENOUS
  Filled 2013-02-24 (×3): qty 1000

## 2013-02-24 MED ORDER — OXYCODONE HCL 5 MG PO TABS: 5.0000 mg | ORAL_TABLET | Freq: Once | ORAL | Status: DC | PRN

## 2013-02-24 MED ORDER — SODIUM CHLORIDE 0.9 % IJ SOLN: 9.0000 mL | INTRAMUSCULAR | Status: DC | PRN

## 2013-02-24 MED ORDER — PHENYLEPHRINE 40 MCG/ML (10ML) SYRINGE FOR IV PUSH (FOR BLOOD PRESSURE SUPPORT)
PREFILLED_SYRINGE | INTRAVENOUS | Status: AC
  Filled 2013-02-24: qty 10

## 2013-02-24 MED ORDER — PROPOFOL 10 MG/ML IV BOLUS
INTRAVENOUS | Status: AC
  Filled 2013-02-24: qty 20

## 2013-02-24 MED ORDER — ROCURONIUM BROMIDE 100 MG/10ML IV SOLN
INTRAVENOUS | Status: DC | PRN
  Administered 2013-02-24: 40 mg via INTRAVENOUS
  Administered 2013-02-24 (×2): 10 mg via INTRAVENOUS

## 2013-02-24 MED ORDER — MIDAZOLAM HCL 5 MG/5ML IJ SOLN
INTRAMUSCULAR | Status: DC | PRN
  Administered 2013-02-24: 1 mg via INTRAVENOUS

## 2013-02-24 MED ORDER — ARTIFICIAL TEARS OP OINT
TOPICAL_OINTMENT | OPHTHALMIC | Status: AC
  Filled 2013-02-24: qty 3.5

## 2013-02-24 MED ORDER — GLYCOPYRROLATE 0.2 MG/ML IJ SOLN
INTRAMUSCULAR | Status: AC
  Filled 2013-02-24: qty 1

## 2013-02-24 MED ORDER — NEOSTIGMINE METHYLSULFATE 1 MG/ML IJ SOLN
INTRAMUSCULAR | Status: DC | PRN
  Administered 2013-02-24: 4 mg via INTRAVENOUS

## 2013-02-24 MED ORDER — POTASSIUM CHLORIDE IN NACL 20-0.9 MEQ/L-% IV SOLN
INTRAVENOUS | Status: DC
  Administered 2013-02-24 – 2013-02-27 (×6): via INTRAVENOUS
  Filled 2013-02-24 (×12): qty 1000

## 2013-02-24 MED ORDER — GLYCOPYRROLATE 0.2 MG/ML IJ SOLN
INTRAMUSCULAR | Status: AC
  Filled 2013-02-24: qty 3

## 2013-02-24 MED ORDER — ONDANSETRON HCL 4 MG PO TABS: 4.0000 mg | ORAL_TABLET | Freq: Four times a day (QID) | ORAL | Status: DC | PRN

## 2013-02-24 MED ORDER — ONDANSETRON HCL 4 MG/2ML IJ SOLN
INTRAMUSCULAR | Status: DC | PRN
  Administered 2013-02-24: 4 mg via INTRAVENOUS

## 2013-02-24 MED ORDER — HYDROMORPHONE HCL PF 1 MG/ML IJ SOLN
0.2500 mg | INTRAMUSCULAR | Status: DC | PRN
  Administered 2013-02-24 (×2): 0.25 mg via INTRAVENOUS
  Administered 2013-02-24: 0.5 mg via INTRAVENOUS

## 2013-02-24 MED ORDER — NALOXONE HCL 0.4 MG/ML IJ SOLN: 0.4000 mg | INTRAMUSCULAR | Status: DC | PRN

## 2013-02-24 MED ORDER — ONDANSETRON HCL 4 MG/2ML IJ SOLN
INTRAMUSCULAR | Status: AC
  Administered 2013-02-24: 4 mg via INTRAVENOUS
  Filled 2013-02-24: qty 2

## 2013-02-24 SURGICAL SUPPLY — 52 items
APPLIER CLIP 11 MED OPEN (CLIP)
APPLIER CLIP 13 LRG OPEN (CLIP)
APR CLP LRG 13 20 CLIP (CLIP)
APR CLP MED 11 20 MLT OPN (CLIP)
BLADE SURG ROTATE 9660 (MISCELLANEOUS) IMPLANT
CANISTER SUCTION 2500CC (MISCELLANEOUS) ×3 IMPLANT
CHLORAPREP W/TINT 26ML (MISCELLANEOUS) ×3 IMPLANT
CLIP APPLIE 11 MED OPEN (CLIP) ×1 IMPLANT
CLIP APPLIE 13 LRG OPEN (CLIP) IMPLANT
COVER SURGICAL LIGHT HANDLE (MISCELLANEOUS) ×3 IMPLANT
DECANTER SPIKE VIAL GLASS SM (MISCELLANEOUS) IMPLANT
DRAPE LAPAROSCOPIC ABDOMINAL (DRAPES) ×3 IMPLANT
DRAPE SURG 17X23 STRL (DRAPES) ×4 IMPLANT
DRAPE UTILITY 15X26 W/TAPE STR (DRAPE) ×8 IMPLANT
DRAPE WARM FLUID 44X44 (DRAPE) ×2 IMPLANT
DRSG PAD ABDOMINAL 8X10 ST (GAUZE/BANDAGES/DRESSINGS) ×2 IMPLANT
ELECT BLADE 6.5 EXT (BLADE) ×3 IMPLANT
ELECT CAUTERY BLADE 6.4 (BLADE) ×2 IMPLANT
ELECT REM PT RETURN 9FT ADLT (ELECTROSURGICAL) ×3
ELECTRODE REM PT RTRN 9FT ADLT (ELECTROSURGICAL) ×1 IMPLANT
GLOVE BIO SURGEON STRL SZ7.5 (GLOVE) ×2 IMPLANT
GLOVE BIO SURGEON STRL SZ8 (GLOVE) ×2 IMPLANT
GLOVE BIOGEL PI IND STRL 8 (GLOVE) IMPLANT
GLOVE BIOGEL PI INDICATOR 8 (GLOVE) ×2
GLOVE SURG SIGNA 7.5 PF LTX (GLOVE) ×4 IMPLANT
GOWN STRL NON-REIN LRG LVL3 (GOWN DISPOSABLE) ×8 IMPLANT
GOWN STRL REIN XL XLG (GOWN DISPOSABLE) ×5 IMPLANT
KIT BASIN OR (CUSTOM PROCEDURE TRAY) ×3 IMPLANT
KIT ROOM TURNOVER OR (KITS) ×3 IMPLANT
NS IRRIG 1000ML POUR BTL (IV SOLUTION) ×6 IMPLANT
PACK GENERAL/GYN (CUSTOM PROCEDURE TRAY) ×3 IMPLANT
PAD ARMBOARD 7.5X6 YLW CONV (MISCELLANEOUS) ×4 IMPLANT
SPECIMEN JAR X LARGE (MISCELLANEOUS) ×3 IMPLANT
SPONGE GAUZE 4X4 12PLY (GAUZE/BANDAGES/DRESSINGS) ×4 IMPLANT
SPONGE INTESTINAL PEANUT (DISPOSABLE) IMPLANT
SPONGE LAP 18X18 X RAY DECT (DISPOSABLE) ×10 IMPLANT
SPONGE SURGIFOAM ABS GEL 100 (HEMOSTASIS) IMPLANT
STAPLER VISISTAT 35W (STAPLE) ×3 IMPLANT
SUCTION POOLE TIP (SUCTIONS) ×4 IMPLANT
SUT PDS AB 1 TP1 96 (SUTURE) ×6 IMPLANT
SUT SILK 0 TIES 10X30 (SUTURE) ×1 IMPLANT
SUT SILK 2 0 SH (SUTURE) ×2 IMPLANT
SUT SILK 2 0 SH CR/8 (SUTURE) ×6 IMPLANT
SUT SILK 2 0 TIES 10X30 (SUTURE) ×3 IMPLANT
SUT SILK 2 0SH CR/8 30 (SUTURE) ×9 IMPLANT
TAPE CLOTH SURG 6X10 WHT LF (GAUZE/BANDAGES/DRESSINGS) ×4 IMPLANT
TOWEL OR 17X24 6PK STRL BLUE (TOWEL DISPOSABLE) ×1 IMPLANT
TOWEL OR 17X26 10 PK STRL BLUE (TOWEL DISPOSABLE) ×3 IMPLANT
TRAY FOLEY CATH 14FRSI W/METER (CATHETERS) ×2 IMPLANT
TUBE CONNECTING 20'X1/4 (TUBING) ×1
TUBE CONNECTING 20X1/4 (TUBING) ×1 IMPLANT
WATER STERILE IRR 1000ML POUR (IV SOLUTION) ×1 IMPLANT

## 2013-02-24 NOTE — Anesthesia Preprocedure Evaluation (Addendum)
Anesthesia Evaluation  Patient identified by MRN, date of birth, ID band Patient awake    Reviewed: Allergy & Precautions, H&P , NPO status , Patient's Chart, lab work & pertinent test results  Airway Mallampati: II TM Distance: >3 FB Neck ROM: Full    Dental  (+) Edentulous Upper and Dental Advisory Given   Pulmonary asthma , COPD COPD inhaler and oxygen dependent, former smoker,  Wears 2L O2 at night  H/o lung cancer s/p left upper lobectomy in 2004         Cardiovascular hypertension, Pt. on medications     Neuro/Psych  Headaches, Anxiety Depression    GI/Hepatic GERD-  Medicated and Controlled,(+) Cirrhosis -       ,   Endo/Other  diabetes, Type 2, Insulin Dependent and Oral Hypoglycemic AgentsGlucose 194  Renal/GU      Musculoskeletal   Abdominal   Peds  Hematology  (+) Blood dyscrasia, anemia , H/H 10.4/31.1  Platelets 50; will receive platelet infusion prior to surgery per Dr. Ninfa Linden   Anesthesia Other Findings   Reproductive/Obstetrics                         Anesthesia Physical Anesthesia Plan  ASA: III  Anesthesia Plan: General   Post-op Pain Management:    Induction: Intravenous  Airway Management Planned: Oral ETT  Additional Equipment:   Intra-op Plan:   Post-operative Plan: Extubation in OR  Informed Consent: I have reviewed the patients History and Physical, chart, labs and discussed the procedure including the risks, benefits and alternatives for the proposed anesthesia with the patient or authorized representative who has indicated his/her understanding and acceptance.   Dental advisory given  Plan Discussed with: CRNA and Surgeon  Anesthesia Plan Comments:        Anesthesia Quick Evaluation

## 2013-02-24 NOTE — Interval H&P Note (Signed)
History and Physical Interval Note: no change in H and P  02/24/2013 7:24 AM  [name]  has presented today for surgery, with the diagnosis of splenomegaly  The various methods of treatment have been discussed with the patient and family. After consideration of risks, benefits and other options for treatment, the patient has consented to  Procedure(s): SPLENECTOMY (N/A) as a surgical intervention .  The patient's history has been reviewed, patient examined, no change in status, stable for surgery.  I have reviewed the patient's chart and labs.  Questions were answered to the patient's satisfaction.     [name]

## 2013-02-24 NOTE — Significant Event (Signed)
Patient had received 15units Novolog after CBG of 331 and the next  CBG was 348. Called Dr. Ninfa Linden to make him aware of this and patient's home insulin regimen. MD gave order to continue with present orders and for RN to change maintenance fluid to NS with 6mEq KCL at 125cc/hour. Will continue to monitor. Mariza Bourget, Therapist, sports.

## 2013-02-24 NOTE — Anesthesia Postprocedure Evaluation (Signed)
  Anesthesia Post-op Note  Patient: [name]  Procedure(s) Performed: Procedure(s): SPLENECTOMY (N/A)  Patient Location: PACU  Anesthesia Type:General  Level of Consciousness: awake, alert  and oriented  Airway and Oxygen Therapy: Patient Spontanous Breathing and Patient connected to nasal cannula oxygen  Post-op Pain: mild  Post-op Assessment: Post-op Vital signs reviewed and Patient's Cardiovascular Status Stable  Post-op Vital Signs: Reviewed  Complications: No apparent anesthesia complications

## 2013-02-24 NOTE — Progress Notes (Signed)
Blood bank was called and will get platelets ready.  Notified charge CRNA Waunita Schooner of patient needing platelets and also spoke with CRNA sheduled in patient's room that patient needs platelets prior to surgery.

## 2013-02-24 NOTE — Anesthesia Procedure Notes (Signed)
Procedure Name: Intubation Date/Time: 02/24/2013 8:45 AM Performed by: Erik Obey Pre-anesthesia Checklist: Patient identified, Timeout performed, Emergency Drugs available, Suction available and Patient being monitored Patient Re-evaluated:Patient Re-evaluated prior to inductionOxygen Delivery Method: Circle system utilized Preoxygenation: Pre-oxygenation with 100% oxygen Intubation Type: IV induction Ventilation: Mask ventilation without difficulty and Oral airway inserted - appropriate to patient size Laryngoscope Size: Mac and 3 Grade View: Grade I Tube type: Oral Tube size: 7.5 mm Number of attempts: 1 Airway Equipment and Method: Stylet Placement Confirmation: ETT inserted through vocal cords under direct vision,  positive ETCO2 and breath sounds checked- equal and bilateral Secured at: 21 cm Tube secured with: Tape Dental Injury: Teeth and Oropharynx as per pre-operative assessment

## 2013-02-24 NOTE — Op Note (Signed)
SPLENECTOMY  Procedure Note  [name] 02/24/2013   Pre-op Diagnosis: splenomegaly     Post-op Diagnosis: same  Procedure(s): SPLENECTOMY  Surgeon(s): Harl Bowie, MD  Anesthesia: General  Staff:  Circulator: Cyd Silence, RN Relief Circulator: Megan Day Cavanaugh, RN Scrub Person: Leslie Andrea, CST; Aundra Dubin, RN Circulator Assistant: Jaci Standard, RN  Estimated Blood Loss: 1000               Specimens: sent to path          [facility]   Date: 02/24/2013  Time: 10:27 AM

## 2013-02-24 NOTE — Preoperative (Signed)
Beta Blockers   Reason not to administer Beta Blockers:Not Applicable 

## 2013-02-24 NOTE — Transfer of Care (Signed)
Immediate Anesthesia Transfer of Care Note  Patient: [name]  Procedure(s) Performed: Procedure(s): SPLENECTOMY (N/A)  Patient Location: PACU  Anesthesia Type:General  Level of Consciousness: awake, alert  and oriented  Airway & Oxygen Therapy: Patient Spontanous Breathing and Patient connected to face mask oxygen  Post-op Assessment: Report given to PACU RN and Post -op Vital signs reviewed and stable  Post vital signs: Reviewed and stable  Complications: No apparent anesthesia complications

## 2013-02-25 ENCOUNTER — Encounter (HOSPITAL_COMMUNITY): Payer: Self-pay | Admitting: Surgery

## 2013-02-25 ENCOUNTER — Inpatient Hospital Stay (HOSPITAL_COMMUNITY): Payer: Medicare Other

## 2013-02-25 LAB — COMPREHENSIVE METABOLIC PANEL
ALBUMIN: 2.9 g/dL — AB (ref 3.5–5.2)
ALT: 255 U/L — AB (ref 0–35)
AST: 297 U/L — ABNORMAL HIGH (ref 0–37)
Alkaline Phosphatase: 55 U/L (ref 39–117)
BUN: 22 mg/dL (ref 6–23)
CO2: 23 mEq/L (ref 19–32)
Calcium: 8.3 mg/dL — ABNORMAL LOW (ref 8.4–10.5)
Chloride: 109 mEq/L (ref 96–112)
Creatinine, Ser: 1.23 mg/dL — ABNORMAL HIGH (ref 0.50–1.10)
GFR calc Af Amer: 54 mL/min — ABNORMAL LOW (ref >90)
GFR calc non Af Amer: 46 mL/min — ABNORMAL LOW (ref >90)
Glucose, Bld: 165 mg/dL — ABNORMAL HIGH (ref 70–99)
Potassium: 4.9 mEq/L (ref 3.7–5.3)
SODIUM: 144 meq/L (ref 137–147)
TOTAL PROTEIN: 5.5 g/dL — AB (ref 6.0–8.3)
Total Bilirubin: 0.7 mg/dL (ref 0.3–1.2)

## 2013-02-25 LAB — GLUCOSE, CAPILLARY
GLUCOSE-CAPILLARY: 152 mg/dL — AB (ref 70–99)
GLUCOSE-CAPILLARY: 190 mg/dL — AB (ref 70–99)
Glucose-Capillary: 290 mg/dL — ABNORMAL HIGH (ref 70–99)
Glucose-Capillary: 149 mg/dL — ABNORMAL HIGH (ref 70–99)
Glucose-Capillary: 166 mg/dL — ABNORMAL HIGH (ref 70–99)
Glucose-Capillary: 189 mg/dL — ABNORMAL HIGH (ref 70–99)

## 2013-02-25 LAB — PREPARE FRESH FROZEN PLASMA
Unit division: 0
Unit division: 0

## 2013-02-25 LAB — PREPARE PLATELET PHERESIS
Unit division: 0
Unit division: 0

## 2013-02-25 LAB — CBC
HCT: 28 % — ABNORMAL LOW (ref 36.0–46.0)
Hemoglobin: 9.6 g/dL — ABNORMAL LOW (ref 12.0–15.0)
MCH: 29.7 pg (ref 26.0–34.0)
MCHC: 34.3 g/dL (ref 30.0–36.0)
MCV: 86.7 fL (ref 78.0–100.0)
PLATELETS: 103 10*3/uL — AB (ref 150–400)
RBC: 3.23 MIL/uL — AB (ref 3.87–5.11)
RDW: 14.8 % (ref 11.5–15.5)
WBC: 18.7 10*3/uL — ABNORMAL HIGH (ref 4.0–10.5)

## 2013-02-25 MED ORDER — ACETAMINOPHEN 650 MG RE SUPP: 650.0000 mg | Freq: Four times a day (QID) | RECTAL | Status: DC | PRN

## 2013-02-25 NOTE — Progress Notes (Signed)
During assessment, unable to auscultate proper NGT placement,  KUB done, results show NGT coiled in gastric fundus.  Pt complaining of chest pain during NGT flushing today.  Dr. Grandville Silos notified.  Orders received to remove NGT.  Dr. Grandville Silos also notified of temp 101.3 now 100.8 after incentive spirometry use encouraged.  Orders received for prn tylenol.  Will continue to monitor.  Vista Lawman, RN

## 2013-02-25 NOTE — Progress Notes (Addendum)
1 Day Post-Op  Subjective: Alert. Oriented. Pleasant. Cooperative. Mental status normal. Pain control seems very adequate with PCA.  I discussed operative events and  blood product transfusion with the patient this morning. All questions answered. She expresses complete understanding and agreement.  Hemodynamically stable. SpO2 96% on 2 L nasal cannula.No overt signs of ongoing bleeding.Urine output reasonable.JP drainage is getting thinner, output was 214 cc for 12 hours.  Hemoglobin fell from 11.2 at noon yesterday to 9.6 this morning. Platelet count has risen to 103,000. WBC 18,700.   INR had normalized as of noon yesterday.  BUN 22. Creatinine up to 1.23.      AST and ALT elevated but bilirubin normal at 0.7.     Glucose150 to on sliding scale insulin, no Lantus.  Objective: Vital signs in last 24 hours: Temp:  [97.4 F (36.3 C)-101 F (38.3 C)] 97.4 F (36.3 C) (01/29 0358) Pulse Rate:  [72-107] 98 (01/29 0600) Resp:  [9-24] 17 (01/29 0600) BP: (90-159)/(37-89) 141/60 mmHg (01/29 0600) SpO2:  [96 %-100 %] 96 % (01/29 0600) Arterial Line BP: (92-169)/(37-60) 165/56 mmHg (01/29 0600) Weight:  [187 lb 2.7 oz (84.9 kg)] 187 lb 2.7 oz (84.9 kg) (01/29 0600)    Intake/Output from previous day: 01/28 0701 - 01/29 0700 In: 5305.3 [I.V.:2831.3; Blood:2134; NG/GT:90; IV Piggyback:250] Out: 7619 [Urine:1040; Emesis/NG output:450; Drains:462; Blood:1100] Intake/Output this shift: Total I/O In: 1452.1 [I.V.:1392.1; NG/GT:60] Out: 744 [Urine:480; Emesis/NG output:50; Drains:214]    EXAM: General appearance: alert. Cooperative. Minimal distress. No increased work of breathing. Deconditioned. Mental status normal. Resp: clear to auscultation bilaterally GI: abdomen is obese. Soft. Nondistended. Hypoactive bowel sounds. Left subcostal incision clean and dry. JP drainage red but thin. Colostomy healthy.  Lab Results:  Results for orders placed during the hospital encounter of 02/24/13  (from the past 24 hour(s))  PREPARE RBC (CROSSMATCH)     Status: None   Collection Time    02/24/13  9:12 AM      Result Value Range   Order Confirmation ORDER PROCESSED BY BLOOD BANK    PREPARE RBC (CROSSMATCH)     Status: None   Collection Time    02/24/13  9:32 AM      Result Value Range   Order Confirmation ORDER PROCESSED BY BLOOD BANK    PREPARE FRESH FROZEN PLASMA     Status: None   Collection Time    02/24/13  9:40 AM      Result Value Range   Unit Number [id number]     Blood Component Type THAWED PLASMA     Unit division 00     Status of Unit ISSUED     Transfusion Status OK TO TRANSFUSE     Unit Number [id number]     Blood Component Type THAWED PLASMA     Unit division 00     Status of Unit ISSUED     Transfusion Status OK TO TRANSFUSE    POCT I-STAT 7, (LYTES, BLD GAS, ICA,H+H)     Status: Abnormal   Collection Time    02/24/13 10:17 AM      Result Value Range   pH, Arterial 7.287 (*) 7.350 - 7.450   pCO2 arterial 39.5  35.0 - 45.0 mmHg   pO2, Arterial 519.0 (*) 80.0 - 100.0 mmHg   Bicarbonate 18.9 (*) 20.0 - 24.0 mEq/L   TCO2 20  0 - 100 mmol/L   O2 Saturation 100.0     Acid-base deficit 7.0 (*) 0.0 - 2.0  mmol/L   Sodium 138  137 - 147 mEq/L   Potassium 4.3  3.7 - 5.3 mEq/L   Calcium, Ion 0.88 (*) 1.13 - 1.30 mmol/L   HCT 28.0 (*) 36.0 - 46.0 %   Hemoglobin 9.5 (*) 12.0 - 15.0 g/dL   Sample type ARTERIAL    DIC (DISSEMINATED INTRAVASCULAR COAGULATION) PANEL     Status: Abnormal   Collection Time    02/24/13 10:18 AM      Result Value Range   Prothrombin Time 17.1 (*) 11.6 - 15.2 seconds   INR 1.43  0.00 - 1.49   aPTT 29  24 - 37 seconds   Fibrinogen 192 (*) 204 - 475 mg/dL   D-Dimer, Quant 2.21 (*) 0.00 - 0.48 ug/mL-FEU   Platelets 76 (*) 150 - 400 K/uL   Smear Review NO SCHISTOCYTES SEEN    CBC     Status: Abnormal   Collection Time    02/24/13 10:19 AM      Result Value Range   WBC 5.2  4.0 - 10.5 K/uL   RBC 3.26 (*) 3.87 - 5.11 MIL/uL    Hemoglobin 9.8 (*) 12.0 - 15.0 g/dL   HCT 29.0 (*) 36.0 - 46.0 %   MCV 89.0  78.0 - 100.0 fL   MCH 30.1  26.0 - 34.0 pg   MCHC 33.8  30.0 - 36.0 g/dL   RDW 14.2  11.5 - 15.5 %   Platelets 76 (*) 150 - 400 K/uL  GLUCOSE, CAPILLARY     Status: Abnormal   Collection Time    02/24/13 10:48 AM      Result Value Range   Glucose-Capillary 279 (*) 70 - 99 mg/dL   Comment 1 Notify RN    BLOOD GAS, ARTERIAL     Status: Abnormal   Collection Time    02/24/13 10:57 AM      Result Value Range   O2 Content 10.0     Delivery systems SIMPLE MASK     pH, Arterial 7.307 (*) 7.350 - 7.450   pCO2 arterial 45.0  35.0 - 45.0 mmHg   pO2, Arterial 231.0 (*) 80.0 - 100.0 mmHg   Bicarbonate 22.0  20.0 - 24.0 mEq/L   TCO2 23.4  0 - 100 mmol/L   Acid-base deficit 3.4 (*) 0.0 - 2.0 mmol/L   O2 Saturation 99.9     Patient temperature 97.8     Collection site ARTERIAL LINE     Drawn by COLLECTED BY NURSE     Sample type ARTERIAL    GLUCOSE, CAPILLARY     Status: Abnormal   Collection Time    02/24/13 12:49 PM      Result Value Range   Glucose-Capillary 331 (*) 70 - 99 mg/dL   Comment 1 Notify RN    MRSA PCR SCREENING     Status: None   Collection Time    02/24/13 12:50 PM      Result Value Range   MRSA by PCR NEGATIVE  NEGATIVE  CBC     Status: Abnormal   Collection Time    02/24/13 12:52 PM      Result Value Range   WBC 13.5 (*) 4.0 - 10.5 K/uL   RBC 3.78 (*) 3.87 - 5.11 MIL/uL   Hemoglobin 11.2 (*) 12.0 - 15.0 g/dL   HCT 32.8 (*) 36.0 - 46.0 %   MCV 86.8  78.0 - 100.0 fL   MCH 29.6  26.0 - 34.0 pg  MCHC 34.1  30.0 - 36.0 g/dL   RDW 14.1  11.5 - 15.5 %   Platelets 86 (*) 150 - 400 K/uL  PROTIME-INR     Status: None   Collection Time    02/24/13 12:52 PM      Result Value Range   Prothrombin Time 15.2  11.6 - 15.2 seconds   INR 1.23  0.00 - 1.49  COMPREHENSIVE METABOLIC PANEL     Status: Abnormal   Collection Time    02/24/13 12:52 PM      Result Value Range   Sodium 138  137 - 147  mEq/L   Potassium 5.1  3.7 - 5.3 mEq/L   Chloride 102  96 - 112 mEq/L   CO2 19  19 - 32 mEq/L   Glucose, Bld 340 (*) 70 - 99 mg/dL   BUN 21  6 - 23 mg/dL   Creatinine, Ser 1.05  0.50 - 1.10 mg/dL   Calcium 8.5  8.4 - 10.5 mg/dL   Total Protein 5.6 (*) 6.0 - 8.3 g/dL   Albumin 3.1 (*) 3.5 - 5.2 g/dL   AST 63 (*) 0 - 37 U/L   ALT 47 (*) 0 - 35 U/L   Alkaline Phosphatase 53  39 - 117 U/L   Total Bilirubin 1.3 (*) 0.3 - 1.2 mg/dL   GFR calc non Af Amer 56 (*) >90 mL/min   GFR calc Af Amer 65 (*) >90 mL/min  GLUCOSE, CAPILLARY     Status: Abnormal   Collection Time    02/24/13  3:27 PM      Result Value Range   Glucose-Capillary 348 (*) 70 - 99 mg/dL  CBC     Status: Abnormal   Collection Time    02/24/13  4:40 PM      Result Value Range   WBC 19.7 (*) 4.0 - 10.5 K/uL   RBC 3.71 (*) 3.87 - 5.11 MIL/uL   Hemoglobin 11.1 (*) 12.0 - 15.0 g/dL   HCT 31.9 (*) 36.0 - 46.0 %   MCV 86.0  78.0 - 100.0 fL   MCH 29.9  26.0 - 34.0 pg   MCHC 34.8  30.0 - 36.0 g/dL   RDW 14.3  11.5 - 15.5 %   Platelets 82 (*) 150 - 400 K/uL  GLUCOSE, CAPILLARY     Status: Abnormal   Collection Time    02/24/13  7:13 PM      Result Value Range   Glucose-Capillary 290 (*) 70 - 99 mg/dL   Comment 1 Documented in Chart     Comment 2 Notify RN    BLOOD PRODUCT ORDER (VERBAL) VERIFICATION     Status: None   Collection Time    02/24/13  8:00 PM      Result Value Range   Blood product order confirm MD AUTHORIZATION REQUESTED    BLOOD PRODUCT ORDER (VERBAL) VERIFICATION     Status: None   Collection Time    02/24/13  8:00 PM      Result Value Range   Blood product order confirm MD AUTHORIZATION REQUESTED    GLUCOSE, CAPILLARY     Status: Abnormal   Collection Time    02/24/13 11:14 PM      Result Value Range   Glucose-Capillary 201 (*) 70 - 99 mg/dL   Comment 1 Documented in Chart     Comment 2 Notify RN    GLUCOSE, CAPILLARY     Status: Abnormal   Collection Time  02/25/13  3:35 AM      Result  Value Range   Glucose-Capillary 152 (*) 70 - 99 mg/dL   Comment 1 Documented in Chart     Comment 2 Notify RN    CBC     Status: Abnormal   Collection Time    02/25/13  5:00 AM      Result Value Range   WBC 18.7 (*) 4.0 - 10.5 K/uL   RBC 3.23 (*) 3.87 - 5.11 MIL/uL   Hemoglobin 9.6 (*) 12.0 - 15.0 g/dL   HCT 28.0 (*) 36.0 - 46.0 %   MCV 86.7  78.0 - 100.0 fL   MCH 29.7  26.0 - 34.0 pg   MCHC 34.3  30.0 - 36.0 g/dL   RDW 14.8  11.5 - 15.5 %   Platelets 103 (*) 150 - 400 K/uL  COMPREHENSIVE METABOLIC PANEL     Status: Abnormal   Collection Time    02/25/13  5:00 AM      Result Value Range   Sodium 144  137 - 147 mEq/L   Potassium 4.9  3.7 - 5.3 mEq/L   Chloride 109  96 - 112 mEq/L   CO2 23  19 - 32 mEq/L   Glucose, Bld 165 (*) 70 - 99 mg/dL   BUN 22  6 - 23 mg/dL   Creatinine, Ser 1.23 (*) 0.50 - 1.10 mg/dL   Calcium 8.3 (*) 8.4 - 10.5 mg/dL   Total Protein 5.5 (*) 6.0 - 8.3 g/dL   Albumin 2.9 (*) 3.5 - 5.2 g/dL   AST 297 (*) 0 - 37 U/L   ALT 255 (*) 0 - 35 U/L   Alkaline Phosphatase 55  39 - 117 U/L   Total Bilirubin 0.7  0.3 - 1.2 mg/dL   GFR calc non Af Amer 46 (*) >90 mL/min   GFR calc Af Amer 54 (*) >90 mL/min     Studies/Results: @RISRSLT24 @  . HYDROmorphone PCA 0.3 mg/mL   Intravenous Q4H  . insulin aspart  0-20 Units Subcutaneous Q4H  . mometasone-formoterol  2 puff Inhalation BID  . pantoprazole (PROTONIX) IV  40 mg Intravenous Daily     Assessment/Plan: s/p Procedure(s): SPLENECTOMY  POD #1. 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 Stable. Significant intraoperative hemorrhage seems to be resolved  Coagulopathy resolved. Thrombocytopenia improved. Continue ICU observation.  up to chair. Incentive spirometry. Consider discontinuing NG and Foley tomorrow. Also plan to discontinue a line about tomorrow. Check labs tomorrow. Will need pneumococcal, meningococcal, and H. Influenzae vaccines when more stable  Cirrhosis  with ascites. Suspect portal hypertension. This may have contributed to the volume of hemorrhage in the OR yesterday.  DVT prophylaxis. SCD hose only. No pharmacologic treatment at this time due to risk of bleeding  IDDM. Continue sliding-scale insulin Q4 hours. Target glucose of 120-180. Consider basal insulin if hyper glycemia recurs.  COPD. Stable.Continue nebulizer and I.S.  GERD. On Protonix.  History of lung cancer.  History perforated colon with colostomy. No issues.  @PROBHOSP @  LOS: 1 day    Garrus Gauthreaux M 02/25/2013  . .prob

## 2013-02-25 NOTE — Op Note (Signed)
[name], [name] NO.:  [id number]  MEDICAL RECORD NO.:  [id number]  LOCATION:  2S01C                        FACILITY:  [facility]  PHYSICIAN:  Coralie Keens, M.D. DATE OF BIRTH:  [birth year]  DATE OF PROCEDURE:  02/24/2013 DATE OF DISCHARGE:                              OPERATIVE REPORT   PREOPERATIVE DIAGNOSIS:  Splenomegaly with thrombocytopenia.  POSTOPERATIVE DIAGNOSIS:  Splenomegaly with thrombocytopenia.  PROCEDURE:  Splenectomy.  SURGEON:  Coralie Keens, M.D.  ASSISTANT:  Edsel Petrin. Dalbert Batman, M.D.  ANESTHESIA:  General endotracheal anesthesia.  ESTIMATED BLOOD LOSS:  1000 mL.  INDICATIONS:  This is a 63 year old female with multiple chronic medical conditions including cirrhosis.  She has massive splenomegaly and thrombocytopenia secondary to this.  After a long discussion with the patient, she wished to proceed with splenectomy in hopes of increasing her platelet count.  Risks were discussed with her in detail.  FINDINGS:  The patient was found to have a massively enlarged spleen. She also has significant cirrhosis with a mild amount of ascites.  DESCRIPTION OF PROCEDURE:  The patient was brought to the operating room, identified as [name].  She was placed supine on the operating room table and general anesthesia was induced.  Her abdomen was then prepped and draped in usual sterile fashion.  I made a left subcostal incision with a scalpel.  I took this down through the muscle layers and fascia with the electrocautery.  The peritoneum was opened to the entire length of the incision.  Upon entering the abdomen, there was a small amount of ascites which was suctioned free.  The patient had a very hard firm cirrhotic liver.  The spleen itself was massively enlarged.  There were large amount of adhesions on the rest of the abdomen, but minimal in the left upper quadrant.  I felt back behind the spleen and felt minimal attachments.  There  were no attachments to the diaphragm.  I then was able to come up under the spleen and gently elevated out of the wound.  As it was elevated out of the wound, we started dissecting at the lower pole and upper pole when the enlarged splenic vein suddenly tore tangentially.  There was a large amount of blood loss immediately.  We quickly clamped across the hilum with several clamps and completed the splenectomy and removed the spleen.  We then had a difficult time controlling the hemorrhage from the splenic vein.  This took several clamps and several 2-0 silk suture ligatures to finally achieve hemostasis.  During this __________ amount of blood loss.  Once __________ control of hemostasis, we then had to place several sutures along the superior edge of the pancreas to achieve hemostasis.  We also placed sutures along the stomach with the previous short gastric vessels.  Massive transfusion was undertaken and hemostasis appeared to be controlled.  We again evaluated the hilum several times and we were able to achieve hemostasis with sutures.  We then thoroughly irrigated the wound with a liter of normal saline. Again, hemostasis appeared to be achieved.  I did place 2 pieces __________ along the hilum where the spleen had been.  We next made several  skin incision and placed a 19-French Blake drain into the left upper quadrant around the pancreas.  This was sewn in place with a nylon suture.  Again, we thoroughly evaluated the abdomen in the left upper quadrant.  Again hemostasis appeared to be achieved.  The posterior fascia then closed with a running #1 looped PDS suture, and the anterior fascia was closed with a running looped PDS suture as well.  The skin was closed with skin staples.  The bulb was placed to bulb suction.  The patient was hemodynamically stable at the end of the procedure.  All counts were correct at the end of procedure.  The anesthesia was then attempting to extubate  the patient, and taken to the recovery room.     Coralie Keens, M.D.     DB/MEDQ  D:  02/24/2013  T:  02/24/2013  Job:  [id number]

## 2013-02-25 NOTE — Evaluation (Signed)
Physical Therapy Evaluation Patient Details Name: [name] MRN: [id number] DOB: 05-Mar-1950 Today's Date: 02/25/2013 Time: 2297-9892 PT Time Calculation (min): 37 min  PT Assessment / Plan / Recommendation History of Present Illness  pt admitted for evaluation f splenectomy.    Clinical Impression  Pt admitted for splenectomy due to massive splenomegaly. Pt currently with functional limitations due to the deficits listed below (see PT Problem List).  Pt will benefit from skilled PT to increase their independence and safety with mobility to allow discharge to the venue listed below.       PT Assessment  Patient needs continued PT services    Follow Up Recommendations  Home health PT;Other (comment) (unless doesn't make expected progress then STSNF)    Does the patient have the potential to tolerate intense rehabilitation      Barriers to Discharge   per pt she hopes to d/c to a daughter's home that does not work.    Equipment Recommendations  Hospital bed (tub/shower seat)    Recommendations for Other Services     Frequency Min 3X/week    Precautions / Restrictions Precautions Precautions: Fall   Pertinent Vitals/Pain HR at rest 114bpm, EHR  Max 125 bpm.  sats 99-100% on 2L through out      Mobility  Transfers Overall transfer level: Needs assistance Transfers: Sit to/from Stand Sit to Stand: Min assist General transfer comment: assist to come forward Ambulation/Gait Ambulation/Gait assistance: Min assist;Min guard (some assist as fatigue set in ) Ambulation Distance (Feet): 330 Feet Assistive device:  (pushed W/C) Gait Pattern/deviations: Step-to pattern;Decreased step length - right;Decreased step length - left;Decreased stride length;Trunk flexed;Wide base of support Gait velocity: slow Gait velocity interpretation: Below normal speed for age/gender General Gait Details: started to wander Right with fatigue    Exercises     PT Diagnosis: Generalized  weakness;Acute pain  PT Problem List: Decreased strength;Decreased activity tolerance;Decreased balance;Decreased mobility;Decreased knowledge of use of DME;Pain;Cardiopulmonary status limiting activity PT Treatment Interventions: DME instruction;Gait training;Stair training;Functional mobility training;Therapeutic activities;Patient/family education;Balance training     PT Goals(Current goals can be found in the care plan section) Acute Rehab PT Goals Patient Stated Goal: To my daughter's home and then back home by myself PT Goal Formulation: With patient Time For Goal Achievement: 03/04/13 Potential to Achieve Goals: Good  Visit Information  Last PT Received On: 02/25/13 Assistance Needed: +1 History of Present Illness: pt admitted for evaluation f splenectomy.         Prior [place] expects to be discharged to:: Private residence Living Arrangements: Children (son, dil and grandchildren 4) Available Help at Discharge: Other (Comment) (going to another daughters home in [place]) Type of Home: House Home Access: Stairs to enter [employer] of Steps: 3 Entrance Stairs-Rails: None Home Layout: One level Home Equipment: Cane - single point;Walker - 2 wheels Additional Comments: Pt could benefit from tub seat but states she cannot afford it and insurance will not pay for it. Prior Function Level of Independence: Needs assistance Gait / Transfers Assistance Needed: walks with cane or walker with distant S at home. ADL's / Homemaking Assistance Needed: meal prep, housekeeping, occasionally with bathing. Communication Communication: No difficulties Dominant Hand: Right    Cognition  Cognition Arousal/Alertness: Awake/alert Behavior During Therapy: WFL for tasks assessed/performed Overall Cognitive Status: Within Functional Limits for tasks assessed    Extremity/Trunk Assessment Upper Extremity Assessment Upper Extremity Assessment:  Generalized weakness;Overall [facility] for tasks assessed Lower Extremity Assessment Lower Extremity Assessment:  Generalized weakness   Balance Balance Overall balance assessment: Needs assistance Sitting-balance support: No upper extremity supported Sitting balance-Leahy Scale: Good Standing balance support: Bilateral upper extremity supported Standing balance-Leahy Scale: Good  End of Session PT - End of Session Equipment Utilized During Treatment: Oxygen Activity Tolerance: Patient tolerated treatment well Patient left: in chair;with call bell/phone within reach Nurse Communication: Mobility status  GP     Kashay Cavenaugh, Tessie Fass 02/25/2013, 10:59 AM  02/25/2013  Donnella Sham, [place] [phone]  (pager)

## 2013-02-25 NOTE — Plan of Care (Signed)
Problem: Phase II Progression Outcomes Goal: Pain controlled Outcome: Progressing Pt with PCA

## 2013-02-26 LAB — COMPREHENSIVE METABOLIC PANEL
ALK PHOS: 59 U/L (ref 39–117)
ALT: 688 U/L — ABNORMAL HIGH (ref 0–35)
AST: 654 U/L — AB (ref 0–37)
Albumin: 2.6 g/dL — ABNORMAL LOW (ref 3.5–5.2)
BILIRUBIN TOTAL: 0.6 mg/dL (ref 0.3–1.2)
BUN: 18 mg/dL (ref 6–23)
CHLORIDE: 109 meq/L (ref 96–112)
CO2: 22 meq/L (ref 19–32)
CREATININE: 1 mg/dL (ref 0.50–1.10)
Calcium: 8.1 mg/dL — ABNORMAL LOW (ref 8.4–10.5)
GFR calc Af Amer: 69 mL/min — ABNORMAL LOW (ref >90)
GFR, EST NON AFRICAN AMERICAN: 60 mL/min — AB (ref >90)
Glucose, Bld: 211 mg/dL — ABNORMAL HIGH (ref 70–99)
POTASSIUM: 4.8 meq/L (ref 3.7–5.3)
Sodium: 142 mEq/L (ref 137–147)
Total Protein: 5.4 g/dL — ABNORMAL LOW (ref 6.0–8.3)

## 2013-02-26 LAB — GLUCOSE, CAPILLARY
GLUCOSE-CAPILLARY: 141 mg/dL — AB (ref 70–99)
GLUCOSE-CAPILLARY: 173 mg/dL — AB (ref 70–99)
GLUCOSE-CAPILLARY: 183 mg/dL — AB (ref 70–99)
GLUCOSE-CAPILLARY: 196 mg/dL — AB (ref 70–99)
GLUCOSE-CAPILLARY: 212 mg/dL — AB (ref 70–99)
Glucose-Capillary: 197 mg/dL — ABNORMAL HIGH (ref 70–99)

## 2013-02-26 LAB — CBC
HEMATOCRIT: 25.6 % — AB (ref 36.0–46.0)
Hemoglobin: 8.4 g/dL — ABNORMAL LOW (ref 12.0–15.0)
MCH: 29.8 pg (ref 26.0–34.0)
MCHC: 32.8 g/dL (ref 30.0–36.0)
MCV: 90.8 fL (ref 78.0–100.0)
PLATELETS: 146 10*3/uL — AB (ref 150–400)
RBC: 2.82 MIL/uL — AB (ref 3.87–5.11)
RDW: 15.2 % (ref 11.5–15.5)
WBC: 23.2 10*3/uL — AB (ref 4.0–10.5)

## 2013-02-26 LAB — PROTIME-INR
INR: 1.28 (ref 0.00–1.49)
Prothrombin Time: 15.7 seconds — ABNORMAL HIGH (ref 11.6–15.2)

## 2013-02-26 MED ORDER — INSULIN GLARGINE 100 UNIT/ML ~~LOC~~ SOLN
15.0000 [IU] | Freq: Every day | SUBCUTANEOUS | Status: DC
  Administered 2013-02-26: 15 [IU] via SUBCUTANEOUS
  Filled 2013-02-26 (×2): qty 0.15

## 2013-02-26 NOTE — Progress Notes (Addendum)
Inpatient Diabetes Program Recommendations  AACE/ADA: New Consensus Statement on Inpatient Glycemic Control (2013)  Target Ranges:  Prepandial:   less than 140 mg/dL      Peak postprandial:   less than 180 mg/dL (1-2 hours)      Critically ill patients:  140 - 180 mg/dL   Reason for Visit: Results for IMANII, GOSDIN (MRN [id number]) as of 02/26/2013 13:10  Ref. Range 02/25/2013 15:24 02/25/2013 19:13 02/25/2013 23:17 02/26/2013 03:24 02/26/2013 11:52  Glucose-Capillary Latest Range: 70-99 mg/dL 173 (H) 189 (H) 190 (H) 196 (H) 212 (H)    Diabetes history: Type 2 Diabetes Outpatient Diabetes medications: According to medication reconciliation, patient was taking Lantus 60 units bid, Humalog 47 units breakfast, 35 units lunch, and 45 units supper, Metformin 1000 mg bid  Current orders for Inpatient glycemic control: Lantus 15 units daily (Added today 02/26/13) and Resistant Novolog correction q 4 hours.  Agree with restart of Lantus today. Note that patient may need titration up of Lantus based on home dose.    Thanks, Adah Perl, RN, BC-ADM Inpatient Diabetes Coordinator Pager [phone]

## 2013-02-26 NOTE — Progress Notes (Signed)
2 Days Post-Op  Subjective: Alert. Oriented. Cooperative. Sitting up in chair. Has ambulated in the hall 2 or 3 times. Pain control reasonable but likes the PCA. NG tube fell out and has been left out. Denies nausea. Passing lots of flatus and colostomy bag.  Remains hemodynamically stable. Reasonable urine output.Tip up to 101.3 yesterday. Most recent 99.4. SaO2 96% on 1 L.   Cough is a little bit productive. Receiving her inhaler therapy.  Hemoglobin drifted down from 9.6 yesterday to 8.4 today. Platelet count 1 46,000. WBC 23,000, probably secondary to splenectomy. INR 1.28. Creatinine down to 1.0. Potassium 4.8. Glucose 211.  Objective: Vital signs in last 24 hours: Temp:  [98.5 F (36.9 C)-101.3 F (38.5 C)] 99.4 F (37.4 C) (01/30 0349) Pulse Rate:  [92-109] 98 (01/30 0600) Resp:  [10-20] 16 (01/30 0600) BP: (119-182)/(42-135) 130/54 mmHg (01/30 0600) SpO2:  [94 %-98 %] 96 % (01/30 0600) Arterial Line BP: (142-175)/(49-66) 158/55 mmHg (01/30 0600) Weight:  [189 lb 2.5 oz (85.8 kg)] 189 lb 2.5 oz (85.8 kg) (01/30 0400)    Intake/Output from previous day: 01/29 0701 - 01/30 0700 In: 3037.7 [I.V.:3037.7] Out: 1572 [Urine:1225; Emesis/NG output:150; Drains:197] Intake/Output this shift: Total I/O In: 1396.1 [I.V.:1396.1] Out: 872 [Urine:725; Emesis/NG output:100; Drains:47]    EXAM: General appearance: sitting in chair. Appears comfortable. Alert and cooperative. Mental status normal. Slightly pale. Resp: seems clear on right, but rhonchi on the left. GI: soft. Appropriately tender. Nondistended. Colostomy bag full of gas. Left subcostal incision clean and dry. JP drainage very thin, serosanguineous.  Lab Results:  Results for orders placed during the hospital encounter of 02/24/13 (from the past 24 hour(s))  GLUCOSE, CAPILLARY     Status: Abnormal   Collection Time    02/25/13  7:25 AM      Result Value Range   Glucose-Capillary 149 (*) 70 - 99 mg/dL  GLUCOSE,  CAPILLARY     Status: Abnormal   Collection Time    02/25/13 11:53 AM      Result Value Range   Glucose-Capillary 166 (*) 70 - 99 mg/dL  GLUCOSE, CAPILLARY     Status: Abnormal   Collection Time    02/25/13  3:24 PM      Result Value Range   Glucose-Capillary 173 (*) 70 - 99 mg/dL  GLUCOSE, CAPILLARY     Status: Abnormal   Collection Time    02/25/13  7:13 PM      Result Value Range   Glucose-Capillary 189 (*) 70 - 99 mg/dL   Comment 1 Documented in Chart     Comment 2 Notify RN    GLUCOSE, CAPILLARY     Status: Abnormal   Collection Time    02/25/13 11:17 PM      Result Value Range   Glucose-Capillary 190 (*) 70 - 99 mg/dL   Comment 1 Documented in Chart     Comment 2 Notify RN    GLUCOSE, CAPILLARY     Status: Abnormal   Collection Time    02/26/13  3:24 AM      Result Value Range   Glucose-Capillary 196 (*) 70 - 99 mg/dL   Comment 1 Documented in Chart     Comment 2 Notify RN    CBC     Status: Abnormal   Collection Time    02/26/13  4:10 AM      Result Value Range   WBC 23.2 (*) 4.0 - 10.5 K/uL   RBC 2.82 (*) 3.87 -  5.11 MIL/uL   Hemoglobin 8.4 (*) 12.0 - 15.0 g/dL   HCT 25.6 (*) 36.0 - 46.0 %   MCV 90.8  78.0 - 100.0 fL   MCH 29.8  26.0 - 34.0 pg   MCHC 32.8  30.0 - 36.0 g/dL   RDW 15.2  11.5 - 15.5 %   Platelets 146 (*) 150 - 400 K/uL  COMPREHENSIVE METABOLIC PANEL     Status: Abnormal   Collection Time    02/26/13  4:10 AM      Result Value Range   Sodium 142  137 - 147 mEq/L   Potassium 4.8  3.7 - 5.3 mEq/L   Chloride 109  96 - 112 mEq/L   CO2 22  19 - 32 mEq/L   Glucose, Bld 211 (*) 70 - 99 mg/dL   BUN 18  6 - 23 mg/dL   Creatinine, Ser 1.00  0.50 - 1.10 mg/dL   Calcium 8.1 (*) 8.4 - 10.5 mg/dL   Total Protein 5.4 (*) 6.0 - 8.3 g/dL   Albumin 2.6 (*) 3.5 - 5.2 g/dL   AST 654 (*) 0 - 37 U/L   ALT 688 (*) 0 - 35 U/L   Alkaline Phosphatase 59  39 - 117 U/L   Total Bilirubin 0.6  0.3 - 1.2 mg/dL   GFR calc non Af Amer 60 (*) >90 mL/min   GFR calc Af  Amer 69 (*) >90 mL/min  PROTIME-INR     Status: Abnormal   Collection Time    02/26/13  4:10 AM      Result Value Range   Prothrombin Time 15.7 (*) 11.6 - 15.2 seconds   INR 1.28  0.00 - 1.49     Studies/Results: @RISRSLT24 @  . HYDROmorphone PCA 0.3 mg/mL   Intravenous Q4H  . insulin aspart  0-20 Units Subcutaneous Q4H  . insulin glargine  15 Units Subcutaneous QHS  . mometasone-formoterol  2 puff Inhalation BID  . pantoprazole (PROTONIX) IV  40 mg Intravenous Daily     Assessment/Plan: s/p Procedure(s): SPLENECTOMY  POD #2. 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 Stable.  Significant intraoperative hemorrhage seems to be resolved Coagulopathy resolved.  Thrombocytopenia improved.  Remove Foley and A-line and transfer  To 6N.  Liquid diet Check labs tomorrow.  Check pathology. Will need pneumococcal, meningococcal, and H. Influenzae vaccines when more stable   Acute blood loss anemia. Seems to be tolerating well. No indication for transfusion today. Check lab work tomorrow.  Cirrhosis With ascites. Suspect portal hypertension, which may have contributed to the volume of hemorrhage in the OR.   DVT prophylaxis. SCD hose only. No pharmacologic treatment at this time due to risk of bleeding   IDDM. Continue sliding-scale insulin Q4 hours. Control is not ideal. Will add Lantus 15 units at bedtime  COPD. Stable.Continue nebulizer and I.S. Monitor closely due to fever which is probably atelectasis   GERD. On Protonix.  History of lung cancer.  History perforated colon with colostomy. No issues.   @PROBHOSP @  LOS: 2 days    [name] 02/26/2013  . .prob

## 2013-02-27 DIAGNOSIS — E119 Type 2 diabetes mellitus without complications: Secondary | ICD-10-CM

## 2013-02-27 DIAGNOSIS — K7460 Unspecified cirrhosis of liver: Secondary | ICD-10-CM

## 2013-02-27 LAB — GLUCOSE, CAPILLARY
GLUCOSE-CAPILLARY: 129 mg/dL — AB (ref 70–99)
GLUCOSE-CAPILLARY: 158 mg/dL — AB (ref 70–99)
Glucose-Capillary: 153 mg/dL — ABNORMAL HIGH (ref 70–99)
Glucose-Capillary: 154 mg/dL — ABNORMAL HIGH (ref 70–99)
Glucose-Capillary: 197 mg/dL — ABNORMAL HIGH (ref 70–99)

## 2013-02-27 LAB — COMPREHENSIVE METABOLIC PANEL
ALK PHOS: 93 U/L (ref 39–117)
ALT: 397 U/L — AB (ref 0–35)
AST: 136 U/L — ABNORMAL HIGH (ref 0–37)
Albumin: 2.5 g/dL — ABNORMAL LOW (ref 3.5–5.2)
BUN: 14 mg/dL (ref 6–23)
CO2: 21 mEq/L (ref 19–32)
Calcium: 8.4 mg/dL (ref 8.4–10.5)
Chloride: 104 mEq/L (ref 96–112)
Creatinine, Ser: 0.89 mg/dL (ref 0.50–1.10)
GFR calc Af Amer: 79 mL/min — ABNORMAL LOW (ref >90)
GFR calc non Af Amer: 69 mL/min — ABNORMAL LOW (ref >90)
Glucose, Bld: 145 mg/dL — ABNORMAL HIGH (ref 70–99)
POTASSIUM: 4.8 meq/L (ref 3.7–5.3)
SODIUM: 137 meq/L (ref 137–147)
Total Bilirubin: 0.7 mg/dL (ref 0.3–1.2)
Total Protein: 5.6 g/dL — ABNORMAL LOW (ref 6.0–8.3)

## 2013-02-27 LAB — CBC
HCT: 24.7 % — ABNORMAL LOW (ref 36.0–46.0)
Hemoglobin: 8.3 g/dL — ABNORMAL LOW (ref 12.0–15.0)
MCH: 30.6 pg (ref 26.0–34.0)
MCHC: 33.6 g/dL (ref 30.0–36.0)
MCV: 91.1 fL (ref 78.0–100.0)
PLATELETS: 210 10*3/uL (ref 150–400)
RBC: 2.71 MIL/uL — ABNORMAL LOW (ref 3.87–5.11)
RDW: 14.6 % (ref 11.5–15.5)
WBC: 19.4 10*3/uL — ABNORMAL HIGH (ref 4.0–10.5)

## 2013-02-27 LAB — PROTIME-INR
INR: 1.29 (ref 0.00–1.49)
Prothrombin Time: 15.8 seconds — ABNORMAL HIGH (ref 11.6–15.2)

## 2013-02-27 MED ORDER — INSULIN GLARGINE 100 UNIT/ML ~~LOC~~ SOLN
30.0000 [IU] | Freq: Two times a day (BID) | SUBCUTANEOUS | Status: DC
  Administered 2013-02-27 – 2013-02-28 (×3): 30 [IU] via SUBCUTANEOUS
  Filled 2013-02-27 (×5): qty 0.3

## 2013-02-27 MED ORDER — INSULIN ASPART 100 UNIT/ML ~~LOC~~ SOLN
0.0000 [IU] | Freq: Three times a day (TID) | SUBCUTANEOUS | Status: DC
  Administered 2013-02-27 (×2): 4 [IU] via SUBCUTANEOUS
  Administered 2013-02-28: 3 [IU] via SUBCUTANEOUS
  Administered 2013-02-28: 4 [IU] via SUBCUTANEOUS
  Administered 2013-02-28: 7 [IU] via SUBCUTANEOUS
  Administered 2013-03-01 – 2013-03-02 (×4): 4 [IU] via SUBCUTANEOUS
  Administered 2013-03-02 (×2): 3 [IU] via SUBCUTANEOUS
  Administered 2013-03-03: 4 [IU] via SUBCUTANEOUS
  Administered 2013-03-03 – 2013-03-05 (×5): 3 [IU] via SUBCUTANEOUS
  Administered 2013-03-06 – 2013-03-07 (×2): 4 [IU] via SUBCUTANEOUS

## 2013-02-27 NOTE — Progress Notes (Signed)
3 Days Post-Op  Subjective: Still having a fair amount of pain and using the PCA.  Tolerating the full liquid diet.  Coughing some.  Objective: Vital signs in last 24 hours: Temp:  [97.4 F (36.3 C)-99.4 F (37.4 C)] 97.4 F (36.3 C) (01/31 0709) Pulse Rate:  [87-97] 87 (01/31 0709) Resp:  [16-21] 19 (01/31 0754) BP: (121-151)/(44-67) 151/45 mmHg (01/31 0709) SpO2:  [94 %-98 %] 94 % (01/31 0754)    Intake/Output from previous day: 01/30 0701 - 01/31 0700 In: 1684.3 [I.V.:1684.3] Out: 1185 [Urine:1075; Drains:110] Intake/Output this shift:    PE: General- In NAD.  Awake and alert. Lungs-distant breath sounds Abdomen-soft, LUQ incision clean and intact with some bruising, thin serosanguinous drain output  Lab Results:   Recent Labs  02/26/13 0410 02/27/13 0540  WBC 23.2* 19.4*  HGB 8.4* 8.3*  HCT 25.6* 24.7*  PLT 146* 210   BMET  Recent Labs  02/26/13 0410 02/27/13 0540  NA 142 137  K 4.8 4.8  CL 109 104  CO2 22 21  GLUCOSE 211* 145*  BUN 18 14  CREATININE 1.00 0.89  CALCIUM 8.1* 8.4   PT/INR  Recent Labs  02/26/13 0410 02/27/13 0540  LABPROT 15.7* 15.8*  INR 1.28 1.29   Comprehensive Metabolic Panel:    Component Value Date/Time   NA 137 02/27/2013 0540   NA 142 02/26/2013 0410   NA 138 10/08/2012 0757   NA 139 03/23/2012 0938   K 4.8 02/27/2013 0540   K 4.8 02/26/2013 0410   K 5.0 10/08/2012 0757   K 4.6 03/23/2012 0938   CL 104 02/27/2013 0540   CL 109 02/26/2013 0410   CL 103 03/23/2012 0938   CO2 21 02/27/2013 0540   CO2 22 02/26/2013 0410   CO2 23 10/08/2012 0757   CO2 25 03/23/2012 0938   BUN 14 02/27/2013 0540   BUN 18 02/26/2013 0410   BUN 21.9 10/08/2012 0757   BUN 14.3 03/23/2012 0938   CREATININE 0.89 02/27/2013 0540   CREATININE 1.00 02/26/2013 0410   CREATININE 1.3* 10/08/2012 0757   CREATININE 1.2* 03/23/2012 0938   GLUCOSE 145* 02/27/2013 0540   GLUCOSE 211* 02/26/2013 0410   GLUCOSE 302* 10/08/2012 0757   GLUCOSE 319* 03/23/2012 0938   CALCIUM 8.4 02/27/2013 0540   CALCIUM 8.1* 02/26/2013 0410   CALCIUM 9.9 10/08/2012 0757   CALCIUM 9.8 03/23/2012 0938   AST 136* 02/27/2013 0540   AST 654* 02/26/2013 0410   AST 41* 10/08/2012 0757   AST 40* 03/23/2012 0938   ALT 397* 02/27/2013 0540   ALT 688* 02/26/2013 0410   ALT 49 10/08/2012 0757   ALT 46 03/23/2012 0938   ALKPHOS 93 02/27/2013 0540   ALKPHOS 59 02/26/2013 0410   ALKPHOS 65 10/08/2012 0757   ALKPHOS 71 03/23/2012 0938   BILITOT 0.7 02/27/2013 0540   BILITOT 0.6 02/26/2013 0410   BILITOT 0.31 10/08/2012 0757   BILITOT 0.53 03/23/2012 0938   PROT 5.6* 02/27/2013 0540   PROT 5.4* 02/26/2013 0410   PROT 7.2 10/08/2012 0757   PROT 7.2 03/23/2012 0938   ALBUMIN 2.5* 02/27/2013 0540   ALBUMIN 2.6* 02/26/2013 0410   ALBUMIN 3.6 10/08/2012 0757   ALBUMIN 3.7 03/23/2012 0938     Studies/Results: Dg Abd Portable 1v  02/25/2013   CLINICAL DATA:  Check nasogastric catheter placement  EXAM: PORTABLE ABDOMEN - 1 VIEW  COMPARISON:  None.  FINDINGS: A nasogastric catheter is noted coiled within the gastric fundus. Postsurgical  changes are noted in the left upper quadrant with a surgical drain in place. Scattered large and small bowel gas is noted.  IMPRESSION: Nasogastric catheter within the stomach.   Electronically Signed   By: Inez Catalina M.D.   On: 02/25/2013 21:08    Anti-infectives: Anti-infectives   Start     Dose/Rate Route Frequency Ordered Stop   02/24/13 0600  ciprofloxacin (CIPRO) IVPB 400 mg     400 mg 200 mL/hr over 60 Minutes Intravenous On call to O.R. 02/23/13 1410 02/24/13 0845      Assessment  POD #3. 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 Will need pneumococcal, meningococcal, and H. Influenzae vaccines when more stable  Acute blood loss anemia. Seems to be tolerating well.  Hemoglobin stable.  INR normal.  No indication for transfusion. Cirrhosis With ascites. Suspect portal hypertension, which may have contributed to the volume of  hemorrhage in the OR.  DVT prophylaxis. SCD hose only. No pharmacologic treatment at this time due to risk of bleeding  IDDM.  CBG 141-197 on SSI and Lantus. COPD. Stable.Continue nebulizer and I.S. Monitor closely due to fever which is probably atelectasis  GERD. On Protonix.  History of lung cancer.    LOS: 3 days   Plan: Increase Lantus.  Change SSI to ac and hs.  Solid diet.   Donnetta Gillin J 02/27/2013

## 2013-02-28 ENCOUNTER — Inpatient Hospital Stay (HOSPITAL_COMMUNITY): Payer: Medicare Other

## 2013-02-28 LAB — TYPE AND SCREEN
ABO/RH(D): A POS
Antibody Screen: NEGATIVE
UNIT DIVISION: 0
UNIT DIVISION: 0
UNIT DIVISION: 0
UNIT DIVISION: 0
Unit division: 0
Unit division: 0
Unit division: 0
Unit division: 0

## 2013-02-28 LAB — GLUCOSE, CAPILLARY
GLUCOSE-CAPILLARY: 190 mg/dL — AB (ref 70–99)
GLUCOSE-CAPILLARY: 161 mg/dL — AB (ref 70–99)
Glucose-Capillary: 147 mg/dL — ABNORMAL HIGH (ref 70–99)
Glucose-Capillary: 206 mg/dL — ABNORMAL HIGH (ref 70–99)
Glucose-Capillary: 200 mg/dL — ABNORMAL HIGH (ref 70–99)

## 2013-02-28 MED ORDER — HYDROMORPHONE HCL PF 1 MG/ML IJ SOLN
1.0000 mg | INTRAMUSCULAR | Status: DC | PRN
  Administered 2013-02-28 – 2013-03-08 (×34): 1 mg via INTRAVENOUS
  Filled 2013-02-28 (×34): qty 1

## 2013-02-28 MED ORDER — OXYCODONE HCL 5 MG PO TABS
5.0000 mg | ORAL_TABLET | ORAL | Status: DC | PRN
  Administered 2013-02-28 – 2013-03-03 (×14): 10 mg via ORAL
  Administered 2013-03-03: 5 mg via ORAL
  Administered 2013-03-03 – 2013-03-08 (×21): 10 mg via ORAL
  Filled 2013-02-28 (×37): qty 2

## 2013-02-28 MED ORDER — PANTOPRAZOLE SODIUM 40 MG PO TBEC
40.0000 mg | DELAYED_RELEASE_TABLET | Freq: Every day | ORAL | Status: DC
  Administered 2013-03-01 – 2013-03-08 (×8): 40 mg via ORAL
  Filled 2013-02-28 (×8): qty 1

## 2013-02-28 MED ORDER — ONDANSETRON HCL 4 MG/2ML IJ SOLN: 4.0000 mg | INTRAMUSCULAR | Status: DC | PRN

## 2013-02-28 MED ORDER — CIPROFLOXACIN HCL 500 MG PO TABS
500.0000 mg | ORAL_TABLET | Freq: Two times a day (BID) | ORAL | Status: DC
  Administered 2013-03-01 – 2013-03-04 (×8): 500 mg via ORAL
  Filled 2013-02-28 (×13): qty 1

## 2013-02-28 MED ORDER — INSULIN GLARGINE 100 UNIT/ML ~~LOC~~ SOLN
45.0000 [IU] | Freq: Two times a day (BID) | SUBCUTANEOUS | Status: DC
  Administered 2013-02-28 – 2013-03-08 (×15): 45 [IU] via SUBCUTANEOUS
  Filled 2013-02-28 (×17): qty 0.45

## 2013-02-28 NOTE — Progress Notes (Signed)
4 Days Post-Op  Subjective: Still c/o incisional pain.  Tolerating diet.  Objective: Vital signs in last 24 hours: Temp:  [97.5 F (36.4 C)-98.6 F (37 C)] 97.9 F (36.6 C) (02/01 0600) Pulse Rate:  [87-98] 97 (02/01 0600) Resp:  [17-20] 20 (02/01 0800) BP: (125-147)/(47-55) 147/47 mmHg (02/01 0600) SpO2:  [90 %-99 %] 99 % (02/01 0800)    Intake/Output from previous day: 01/31 0701 - 02/01 0700 In: 1812.5 [I.V.:1812.5] Out: 3830 [Urine:3750; Drains:80] Intake/Output this shift:    PE: General- In NAD Abdomen-soft, LUQ incision clean and intact, gas in colostomy, serous drain output  Lab Results:   Recent Labs  02/26/13 0410 02/27/13 0540  WBC 23.2* 19.4*  HGB 8.4* 8.3*  HCT 25.6* 24.7*  PLT 146* 210   BMET  Recent Labs  02/26/13 0410 02/27/13 0540  NA 142 137  K 4.8 4.8  CL 109 104  CO2 22 21  GLUCOSE 211* 145*  BUN 18 14  CREATININE 1.00 0.89  CALCIUM 8.1* 8.4   PT/INR  Recent Labs  02/26/13 0410 02/27/13 0540  LABPROT 15.7* 15.8*  INR 1.28 1.29   Comprehensive Metabolic Panel:    Component Value Date/Time   NA 137 02/27/2013 0540   NA 142 02/26/2013 0410   NA 138 10/08/2012 0757   NA 139 03/23/2012 0938   K 4.8 02/27/2013 0540   K 4.8 02/26/2013 0410   K 5.0 10/08/2012 0757   K 4.6 03/23/2012 0938   CL 104 02/27/2013 0540   CL 109 02/26/2013 0410   CL 103 03/23/2012 0938   CO2 21 02/27/2013 0540   CO2 22 02/26/2013 0410   CO2 23 10/08/2012 0757   CO2 25 03/23/2012 0938   BUN 14 02/27/2013 0540   BUN 18 02/26/2013 0410   BUN 21.9 10/08/2012 0757   BUN 14.3 03/23/2012 0938   CREATININE 0.89 02/27/2013 0540   CREATININE 1.00 02/26/2013 0410   CREATININE 1.3* 10/08/2012 0757   CREATININE 1.2* 03/23/2012 0938   GLUCOSE 145* 02/27/2013 0540   GLUCOSE 211* 02/26/2013 0410   GLUCOSE 302* 10/08/2012 0757   GLUCOSE 319* 03/23/2012 0938   CALCIUM 8.4 02/27/2013 0540   CALCIUM 8.1* 02/26/2013 0410   CALCIUM 9.9 10/08/2012 0757   CALCIUM 9.8 03/23/2012 0938   AST  136* 02/27/2013 0540   AST 654* 02/26/2013 0410   AST 41* 10/08/2012 0757   AST 40* 03/23/2012 0938   ALT 397* 02/27/2013 0540   ALT 688* 02/26/2013 0410   ALT 49 10/08/2012 0757   ALT 46 03/23/2012 0938   ALKPHOS 93 02/27/2013 0540   ALKPHOS 59 02/26/2013 0410   ALKPHOS 65 10/08/2012 0757   ALKPHOS 71 03/23/2012 0938   BILITOT 0.7 02/27/2013 0540   BILITOT 0.6 02/26/2013 0410   BILITOT 0.31 10/08/2012 0757   BILITOT 0.53 03/23/2012 0938   PROT 5.6* 02/27/2013 0540   PROT 5.4* 02/26/2013 0410   PROT 7.2 10/08/2012 0757   PROT 7.2 03/23/2012 0938   ALBUMIN 2.5* 02/27/2013 0540   ALBUMIN 2.6* 02/26/2013 0410   ALBUMIN 3.6 10/08/2012 0757   ALBUMIN 3.7 03/23/2012 0938     Studies/Results: No results found.  Anti-infectives: Anti-infectives   Start     Dose/Rate Route Frequency Ordered Stop   02/24/13 0600  ciprofloxacin (CIPRO) IVPB 400 mg     400 mg 200 mL/hr over 60 Minutes Intravenous On call to O.R. 02/23/13 1410 02/24/13 0845      Assessment POD #3. Open Elective splenectomy  for massive splenomegaly and thrombocytopenia-still c/o incisional pain; tolerating diet Will need pneumococcal, meningococcal, and H. Influenzae vaccines when more stable  Acute blood loss anemia. Seems to be tolerating well. Cirrhosis With ascites. Suspect portal hypertension, which may have contributed to the volume of hemorrhage in the OR.  DVT prophylaxis. SCD hose only. No pharmacologic treatment at this time due to risk of bleeding  IDDM. CBG 129-197 on SSI and increased Lantus.  COPD. Stable.Continue nebulizer and I.S.GERD. On Protonix.  History of lung cancer.      LOS: 4 days   Plan: D/C PCA. Heplock IV.  Oral analgesic with IV Dilaudid for breakthrough.  Increase lantus a little more.   Lewanna Petrak J 02/28/2013

## 2013-02-28 NOTE — Progress Notes (Signed)
Physical Therapy Treatment Patient Details Name: [name] MRN: [id number] DOB: [birth year] Today's Date: 02/28/2013 Time: 1210-1249 PT Time Calculation (min): 39 min  PT Assessment / Plan / Recommendation  History of Present Illness pt admitted for evaluation f splenectomy.     PT Comments   Pt presents with improved mobility and generally feeling better, glad to have IV disconnected.  Encouraged to ambulate several times per day with nursing assistance to improve cardiopulmonary endurance and overall mobility, which pt is highly motivated to do.  Expect she will be able to be independent in her room soon and recommend nursing assess their comfort level with her.  Anticipate she will be able to d/c to home with continued [facility] services and recommend she is evaluated next visit for possible vestibular component to her c/o dizziness in setting of fall history and report of presentation.  Will ask therapist next session to follow up.  Good progress!   Follow Up Recommendations  Home health PT;Supervision/Assistance - 24 hour (home with daughter?)     Does the patient have the potential to tolerate intense rehabilitation     Barriers to Discharge        Equipment Recommendations       Recommendations for Other Services    Frequency Min 3X/week   Progress towards PT Goals Progress towards PT goals: Progressing toward goals  Plan Current plan remains appropriate    Precautions / Restrictions Precautions Precautions: Fall Precaution Comments: previous fall, describes dizziness consistent with vestibular pathology Restrictions Weight Bearing Restrictions: No   Pertinent Vitals/Pain Mild increased RR with increased activity    Mobility  Bed Mobility Overal bed mobility:  (up in chair) Transfers Overall transfer level: Modified independent Equipment used: Rolling walker (2 wheeled) Transfers: Sit to/from Stand Sit to Stand: Supervision;Modified independent (Device/Increase  time) General transfer comment: able to scoot out to and back from [place] and able to sit/stand with UE support at toilet and recliner unassisted Ambulation/Gait Ambulation/Gait assistance: Supervision Ambulation Distance (Feet): 450 Feet Assistive device: Rolling walker (2 wheeled) Gait Pattern/deviations: Step-through pattern;Trunk flexed Gait velocity: 0.9 ft/sec Gait velocity interpretation: <1.8 ft/sec, indicative of risk for recurrent falls General Gait Details: 6 minute walk test, able to continuously walk and cover 242 feet (73 meters), which is far below the age-related norm of 1765 feet (538 meters) (<200 m (656 ft) indicative of hospitalization/mortality)    Exercises     PT Diagnosis:    PT Problem List:   PT Treatment Interventions:     PT Goals (current goals can now be found in the care plan section) Acute Rehab PT Goals Patient Stated Goal: To my daughter's home and then back home by myself PT Goal Formulation: With patient  Visit Information  Last PT Received On: 02/28/13 Assistance Needed: +1 History of Present Illness: pt admitted for evaluation f splenectomy.      Subjective Data  Subjective: feeling better, glad to have iv disconnected, hope to get home Patient Stated Goal: To my daughter's home and then back home by myself   Cognition  Cognition Arousal/Alertness: Awake/alert Behavior During Therapy: WFL for tasks assessed/performed Overall Cognitive Status: Within Functional Limits for tasks assessed    Balance  Balance Overall balance assessment: No apparent balance deficits (not formally assessed);History of Falls (recommend vestibular eval for suspected BPPV) Sitting balance-Leahy Scale: Good Standing balance-Leahy Scale: Good General Comments General comments (skin integrity, edema, etc.): abdominal wound, did not visualize.  EDUCATED patient on gentle extension ROM to promote  functional scar tissue formation (within comfortable pain free ROM)  End  of Session PT - End of Session Equipment Utilized During Treatment: Oxygen (2 L per [place], Borg rating 6/10 with min observed incr RR) Activity Tolerance: Patient tolerated treatment well Patient left: in chair;with call bell/phone within reach Nurse Communication: Mobility status   GP     Herbie Drape 02/28/2013, 1:00 PM

## 2013-02-28 NOTE — Progress Notes (Signed)
Pt's temp 102.7.  Pt with nonproductive cough,crackles in bases.  Incentive spirometry encouraged.  MD paged.

## 2013-02-28 NOTE — Progress Notes (Signed)
Pt notified RN of left chest pain. States it is "a sharp pain that woke me up" Radiating to from mid to left side of chest. VSS. Dr. Brantley Stage notified.

## 2013-03-01 DIAGNOSIS — J189 Pneumonia, unspecified organism: Secondary | ICD-10-CM

## 2013-03-01 DIAGNOSIS — J9610 Chronic respiratory failure, unspecified whether with hypoxia or hypercapnia: Secondary | ICD-10-CM

## 2013-03-01 DIAGNOSIS — J449 Chronic obstructive pulmonary disease, unspecified: Secondary | ICD-10-CM

## 2013-03-01 LAB — URINALYSIS, ROUTINE W REFLEX MICROSCOPIC
Bilirubin Urine: NEGATIVE
Glucose, UA: 100 mg/dL — AB
Hgb urine dipstick: NEGATIVE
Ketones, ur: NEGATIVE mg/dL
NITRITE: NEGATIVE
Protein, ur: 30 mg/dL — AB
SPECIFIC GRAVITY, URINE: 1.014 (ref 1.005–1.030)
Urobilinogen, UA: 0.2 mg/dL (ref 0.0–1.0)
pH: 5.5 (ref 5.0–8.0)

## 2013-03-01 LAB — COMPREHENSIVE METABOLIC PANEL
ALBUMIN: 2.4 g/dL — AB (ref 3.5–5.2)
ALT: 157 U/L — AB (ref 0–35)
AST: 47 U/L — ABNORMAL HIGH (ref 0–37)
Alkaline Phosphatase: 119 U/L — ABNORMAL HIGH (ref 39–117)
BUN: 12 mg/dL (ref 6–23)
CALCIUM: 8.7 mg/dL (ref 8.4–10.5)
CO2: 20 mEq/L (ref 19–32)
CREATININE: 0.84 mg/dL (ref 0.50–1.10)
Chloride: 100 mEq/L (ref 96–112)
GFR calc Af Amer: 85 mL/min — ABNORMAL LOW (ref >90)
GFR calc non Af Amer: 74 mL/min — ABNORMAL LOW (ref >90)
Glucose, Bld: 151 mg/dL — ABNORMAL HIGH (ref 70–99)
Potassium: 4.7 mEq/L (ref 3.7–5.3)
SODIUM: 135 meq/L — AB (ref 137–147)
TOTAL PROTEIN: 6.1 g/dL (ref 6.0–8.3)
Total Bilirubin: 1 mg/dL (ref 0.3–1.2)

## 2013-03-01 LAB — URINE MICROSCOPIC-ADD ON

## 2013-03-01 LAB — GLUCOSE, CAPILLARY
GLUCOSE-CAPILLARY: 184 mg/dL — AB (ref 70–99)
GLUCOSE-CAPILLARY: 199 mg/dL — AB (ref 70–99)
GLUCOSE-CAPILLARY: 189 mg/dL — AB (ref 70–99)
GLUCOSE-CAPILLARY: 181 mg/dL — AB (ref 70–99)

## 2013-03-01 LAB — CBC
HCT: 25.9 % — ABNORMAL LOW (ref 36.0–46.0)
Hemoglobin: 8.6 g/dL — ABNORMAL LOW (ref 12.0–15.0)
MCH: 29.8 pg (ref 26.0–34.0)
MCHC: 33.2 g/dL (ref 30.0–36.0)
MCV: 89.6 fL (ref 78.0–100.0)
PLATELETS: 434 10*3/uL — AB (ref 150–400)
RBC: 2.89 MIL/uL — ABNORMAL LOW (ref 3.87–5.11)
RDW: 14.1 % (ref 11.5–15.5)
WBC: 35 10*3/uL — ABNORMAL HIGH (ref 4.0–10.5)

## 2013-03-01 LAB — LIPASE, BLOOD: Lipase: 62 U/L — ABNORMAL HIGH (ref 11–59)

## 2013-03-01 MED ORDER — IPRATROPIUM-ALBUTEROL 0.5-2.5 (3) MG/3ML IN SOLN
3.0000 mL | Freq: Four times a day (QID) | RESPIRATORY_TRACT | Status: DC
  Administered 2013-03-01 – 2013-03-05 (×14): 3 mL via RESPIRATORY_TRACT
  Filled 2013-03-01 (×15): qty 3

## 2013-03-01 MED ORDER — SODIUM CHLORIDE 0.9 % IV SOLN
INTRAVENOUS | Status: DC
  Administered 2013-03-01 – 2013-03-07 (×6): via INTRAVENOUS
  Administered 2013-03-08: 20 mL/h via INTRAVENOUS

## 2013-03-01 MED ORDER — VANCOMYCIN HCL IN DEXTROSE 1-5 GM/200ML-% IV SOLN
1000.0000 mg | Freq: Two times a day (BID) | INTRAVENOUS | Status: DC
  Administered 2013-03-01 – 2013-03-04 (×7): 1000 mg via INTRAVENOUS
  Filled 2013-03-01 (×9): qty 200

## 2013-03-01 MED ORDER — SODIUM CHLORIDE 0.9 % IV SOLN
500.0000 mg | Freq: Three times a day (TID) | INTRAVENOUS | Status: DC
  Administered 2013-03-01 – 2013-03-08 (×22): 500 mg via INTRAVENOUS
  Filled 2013-03-01 (×24): qty 500

## 2013-03-01 MED ORDER — POLYETHYLENE GLYCOL 3350 17 G PO PACK
17.0000 g | PACK | Freq: Every day | ORAL | Status: DC
  Administered 2013-03-01 – 2013-03-08 (×7): 17 g via ORAL
  Filled 2013-03-01 (×8): qty 1

## 2013-03-01 NOTE — Patient Instructions (Signed)
03/01/2013- 1422- Pt instructed on use of flutter valve- Pt demonstrated understanding of device.  Will follow progress. S Torrey Ballinas rrt, rcp

## 2013-03-01 NOTE — Progress Notes (Addendum)
5 Days Post-Op  Subjective: Alert and stable but doesn't feel well, primarily because of incisional pain. She also has a cough and she feels raveling in her chest but is not producing sputum. Passing flatus but no stool. Tolerating liquids but low volume. Still ambulating.  Developed fever to 102 yesterday. Cipro was started. Chest x-ray shows left lower lobe infiltrate. Urinalysis is negative. Rest of lab work is pending.  Objective: Vital signs in last 24 hours: Temp:  [99.4 F (37.4 C)-102.7 F (39.3 C)] 99.6 F (37.6 C) (02/02 0100) Pulse Rate:  [92-102] 99 (02/02 0100) Resp:  [19-20] 20 (02/02 0100) BP: (136-147)/(49-60) 147/55 mmHg (02/02 0100) SpO2:  [98 %-100 %] 100 % (02/02 0100)    Intake/Output from previous day: 02/01 0701 - 02/02 0700 In: 1231.1 [P.O.:480; I.V.:751.1] Out: 1480 [Urine:1400; Drains:80] Intake/Output this shift: Total I/O In: -  Out: 740 [Urine:700; Drains:40]   EXAM: General appearance: alert. Oriented. Cooperative. Appears fatigued and depressed. Does not appear toxic. Resp: decreased breath sounds at bases, more on the left. Occasional rhonchi. No wheeze. SpO2 100% on 2 liters [place]. GI: abdomen soft. Incision looks fine. Not distended. Appropriate incisional tenderness. Area and colostomy bag. JP drainage watery pinkish green in color.  Lab Results:  Results for orders placed during the hospital encounter of 02/24/13 (from the past 24 hour(s))  GLUCOSE, CAPILLARY     Status: Abnormal   Collection Time    02/28/13  7:25 AM      Result Value Range   Glucose-Capillary 161 (*) 70 - 99 mg/dL   Comment 1 Notify RN    GLUCOSE, CAPILLARY     Status: Abnormal   Collection Time    02/28/13 12:09 PM      Result Value Range   Glucose-Capillary 147 (*) 70 - 99 mg/dL   Comment 1 Notify RN    GLUCOSE, CAPILLARY     Status: Abnormal   Collection Time    02/28/13  5:06 PM      Result Value Range   Glucose-Capillary 206 (*) 70 - 99 mg/dL   Comment 1  Notify RN    GLUCOSE, CAPILLARY     Status: Abnormal   Collection Time    02/28/13  9:34 PM      Result Value Range   Glucose-Capillary 200 (*) 70 - 99 mg/dL  URINALYSIS, ROUTINE W REFLEX MICROSCOPIC     Status: Abnormal   Collection Time    03/01/13  2:52 AM      Result Value Range   Color, Urine YELLOW  YELLOW   APPearance CLEAR  CLEAR   Specific Gravity, Urine 1.014  1.005 - 1.030   pH 5.5  5.0 - 8.0   Glucose, UA 100 (*) NEGATIVE mg/dL   Hgb urine dipstick NEGATIVE  NEGATIVE   Bilirubin Urine NEGATIVE  NEGATIVE   Ketones, ur NEGATIVE  NEGATIVE mg/dL   Protein, ur 30 (*) NEGATIVE mg/dL   Urobilinogen, UA 0.2  0.0 - 1.0 mg/dL   Nitrite NEGATIVE  NEGATIVE   Leukocytes, UA TRACE (*) NEGATIVE  URINE MICROSCOPIC-ADD ON     Status: None   Collection Time    03/01/13  2:52 AM      Result Value Range   Squamous Epithelial / LPF RARE  RARE   WBC, UA 3-6  <3 WBC/hpf   RBC / HPF 0-2  <3 RBC/hpf   Bacteria, UA RARE  RARE     Studies/Results: @RISRSLT24 @  . ciprofloxacin  500  mg Oral BID  . insulin aspart  0-20 Units Subcutaneous TID WC  . insulin glargine  45 Units Subcutaneous BID  . mometasone-formoterol  2 puff Inhalation BID  . pantoprazole  40 mg Oral Daily  . polyethylene glycol  17 g Oral Daily     Assessment/Plan: s/p Procedure(s): SPLENECTOMY  POD #4. 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 Will need pneumococcal, meningococcal, and H. Influenzae vaccines when more stable  Check a serum and fluid lipase.  Suspect HAP. Will add Primaxin to Cipro  to enhance pseudomonas coverage. We'll start vancomycin for gram-positive cocci. We'll resume IV fluids.Continue nebulizer and incentive spirometry. She is somewhat high risk because of COPD and multiple comorbidities.  Will ask the [facility] pulmonary service  to see her in consultation. She has seen them in the  past.  History of lung cancer  Acute blood loss anemia. Seems to be tolerating well. Check CBC.  Cirrhosis With ascites. Suspect portal hypertension, which may have contributed to the volume of hemorrhage in the OR.   DVT prophylaxis. SCD hose only. No pharmacologic treatment at this time due to risk of bleeding   IDDM. CBG 200 on SSI and increased Lantus.   GERD. On Protonix.      @PROBHOSP @  LOS: 5 days    Chinedum Vanhouten M 03/01/2013  . .prob

## 2013-03-01 NOTE — Consult Note (Addendum)
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   Post op developed increased SOB, ?HCAP and PCCM consulted.   SIGNIFICANT EVENTS / STUDIES:  1/28>>> splenectomy   LINES / TUBES: none  CULTURES: Urine 2/2>>> Sputum 2/2>>>  ANTIBIOTICS: Cipro 2/1>>> Vanc 2/2>>> Primaxin 2/2>>>  HISTORY OF PRESENT ILLNESS:  63 yo female with hx COPD (documented COPD via PFT's, followed by Dr. 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  Admitted 1/28 for elective splenectomy r/t massive spenomegaly.  She was progressing well post op but 2/1 developed fever, SOB and ?LLL HCAP.  Pt c/o mild SOB but not much above baseline.  C/o dry cough as well as incisional abd pain.  Denies chest pain, hemoptysis, BLE edema, orthopnea.    PAST MEDICAL HISTORY :  Past Medical History  Diagnosis Date  . Cirrhosis   . GERD (gastroesophageal reflux disease)   . Cervical disc syndrome   . Lung cancer     squamous cell  . Chronic respiratory failure   . Diverticulitis of colon   . Perforation of colon   . Arthritis   . IBS (irritable bowel syndrome)   . Chronic lower GI bleeding   . Overactive bladder   . Cervical cancer   . Thrombocytopenia     sees Dr. Julien Nordmann   . Splenomegaly   . Anxiety   . Depression   . Hypertension   . Asthma   . Chronic headache disorder   . Blood transfusion without reported diagnosis   . Heart murmur   . Hyperlipidemia   . Lung cancer   . COPD (chronic obstructive pulmonary disease)     sees Dr. Gwenette Greet   . Anemia     sees Dr. Julien Nordmann, due to chronic disease and GI losses   . Diabetes mellitus     sees Dr. Cruzita Lederer   . Colostomy care     Past Surgical History  Procedure Laterality Date  . Appendectomy    . Cholecystectomy    . Colostomy    . Pneumonectomy    . Cervix removed    . Bowel resection    . Esophagogastroduodenoscopy  03/19/2011    Procedure: ESOPHAGOGASTRODUODENOSCOPY (EGD);  Surgeon: [name], MD;  Location: [facility];  Service: Endoscopy;  Laterality: N/A;  . Colonoscopy  03/19/2011    Procedure: COLONOSCOPY;  Surgeon: [name], MD;  Location: [facility];  Service: Endoscopy;  Laterality: N/A;  . Givens capsule study  03/20/2011    Procedure: GIVENS CAPSULE STUDY;  Surgeon: [name], MD;  Location: [facility];  Service: Endoscopy;  Laterality: N/A;  . Small bowel obstruction repair  March 2012  . Umbilical hernia repair  March 2012  . Splenectomy, total N/A 02/24/2013    Procedure: SPLENECTOMY;  Surgeon: Harl Bowie, MD;  Location: [place];  Service: General;  Laterality: N/A;   Prior to Admission medications   Medication Sig Start Date End Date Taking? Authorizing Provider  albuterol-ipratropium (COMBIVENT) 18-103 MCG/ACT inhaler Inhale 2 puffs into the lungs every 4 (four) hours as needed for wheezing or shortness of breath.    Yes Historical Provider, MD  cholecalciferol (VITAMIN D)  1000 UNITS tablet Take 1,000 Units by mouth daily.    Yes Historical Provider, MD  diazepam (VALIUM) 5 MG tablet Take 5 mg by mouth 3 (three) times daily as needed for anxiety.   Yes Historical Provider, MD  dicyclomine (BENTYL) 10 MG capsule Take 1 capsule (10 mg total) by mouth 3 (three) times daily. 03/09/12  Yes [name], MD  esomeprazole (NEXIUM) 40 MG capsule Take 40 mg by mouth every evening.   Yes Historical Provider, MD  Fluticasone-Salmeterol (ADVAIR) 250-50 MCG/DOSE AEPB Inhale 1 puff into the lungs every 12 (twelve) hours. 11/23/12  Yes [name], MD  folic acid (FOLVITE) 1 MG tablet Take 1 mg by mouth every evening.    Yes Historical Provider, MD  furosemide (LASIX) 20 MG  tablet Take 20-40 mg by mouth daily as needed for fluid.   Yes Historical Provider, MD  insulin glargine (LANTUS) 100 UNIT/ML injection Inject 0.6 mLs (60 Units total) into the skin 2 (two) times daily. 02/02/13  Yes Philemon Kingdom, MD  insulin lispro (HUMALOG) 100 UNIT/ML injection Inject 35-47 Units into the skin 3 (three) times daily before meals. Injects 47 units daily with breakfast, 35 units daily with lunch, and 45 units daily with supper. 02/11/13  Yes Philemon Kingdom, MD  metFORMIN (GLUCOPHAGE) 1000 MG tablet Take 1,000 mg by mouth 2 (two) times daily with a meal.   Yes Historical Provider, MD  metoCLOPramide (REGLAN) 10 MG tablet Take 10 mg by mouth 3 (three) times daily. 04/22/12  Yes [name], MD  Oxycodone HCl 20 MG TABS Take 1 tablet (20 mg total) by mouth every 3 (three) hours as needed (pain). 12/21/12  Yes [name], MD  potassium chloride (MICRO-K) 10 MEQ CR capsule Take 1 capsule (10 mEq total) by mouth daily. 12/21/12  Yes [name], MD  promethazine (PHENERGAN) 25 MG tablet Take 25 mg by mouth every 6 (six) hours as needed for nausea. For nausea 06/29/12  Yes [name], MD  rifaximin (XIFAXAN) 550 MG TABS tablet Take 550 mg by mouth 2 (two) times daily. 06/29/12  Yes [name], MD  traZODone (DESYREL) 100 MG tablet Take 2 tablets (200 mg total) by mouth at bedtime. 03/09/12  Yes [name], MD  Insulin Pen Needle (BD PEN NEEDLE NANO U/F) 32G X 4 MM MISC Use 4 times daily as directed. 12/30/12   Philemon Kingdom, MD  Insulin Pen Needle (EASY TOUCH PEN NEEDLES) 32G X 4 MM MISC Use as directed, diagnosis code is 250.00 11/25/12   [name], MD   Allergies  Allergen Reactions  . Penicillins Anaphylaxis and Rash  . Codeine Phosphate Other (See Comments)    REACTION: Stomach cramps  . Hydrocodone-Acetaminophen Other (See Comments)    REACTION: hallucinations  . Morphine Other (See Comments)    REACTION: Lowers BP  . Other Other (See Comments)    AGENT:  Per  pt, cannot take blood thinners due to cirrhosis of the liver  . Cephalexin Swelling and Rash    FAMILY HISTORY:  Family History  Problem Relation Age of Onset  . Coronary artery disease    . Diabetes type II    . Anesthesia problems Neg Hx   . Hypotension Neg Hx   . Malignant hyperthermia Neg Hx   . Pseudochol deficiency Neg Hx   . Heart attack Mother   . Diabetes type II Mother   . Cirrhosis Father    SOCIAL HISTORY:  reports that she quit smoking about 10 years ago. Her smoking use included Cigarettes. She has a 70 pack-year smoking history. She has never used smokeless tobacco. She reports that she does not drink alcohol or use illicit drugs.  REVIEW OF SYSTEMS:   As Per HPI  - all other systems reviewed and were neg.    VITAL SIGNS: Temp:  [99.4 F (37.4 C)-102.7 F (39.3 C)] 99.7 F (37.6 C) (02/02 1761) Pulse Rate:  [92-102] 98 (02/02 0633) Resp:  [19-20] 20 (02/02 0633) BP: (136-152)/(49-60) 152/55 mmHg (02/02 0633) SpO2:  [98 %-100 %] 98 % (02/02 0937)  PHYSICAL EXAMINATION: General:  Chronically ill appearing female, NAD in bed  Neuro:  Awake, alert, appropriate, weak  HEENT:  Mm dry, no JVD  Cardiovascular:  s1s2 rrr Lungs:  resps even, mildly labored with speaking, diminished L, few scattered rhonchi R Abdomen:  Soft, tender, diffusely, incision c/d Musculoskeletal:  Pale, warm and dry, scant BLE edema    Recent Labs Lab 02/26/13 0410 02/27/13 0540 03/01/13 0735  NA 142 137 135*  K 4.8 4.8 4.7  CL 109 104 100  CO2 22 21 20   BUN 18 14 12   CREATININE 1.00 0.89 0.84  GLUCOSE 211* 145* 151*    Recent Labs Lab 02/26/13 0410 02/27/13 0540 03/01/13 0735  HGB 8.4* 8.3* 8.6*  HCT 25.6* 24.7* 25.9*  WBC 23.2* 19.4* 35.0*  PLT 146* 210 434*   Dg Chest Port 1 View  02/28/2013   CLINICAL DATA:  Fever.  EXAM: PORTABLE CHEST - 1 VIEW  COMPARISON:  CT chest 11/04/2006 and PA and lateral chest 11/13/2012.  FINDINGS: There is new left basilar airspace  disease. The right lung is clear. Heart size is normal. No pneumothorax is identified.  IMPRESSION: Left basilar airspace disease worrisome for pneumonia.   Electronically Signed   By: Inge Rise M.D.   On: 02/28/2013 23:39    ASSESSMENT / PLAN:  COPD without acute exacerbation  HCAP  Hx lung ca s/p resection LUL   REC -  - cont broad spectrum abx for HCAP as above (pcn allergic), double covg for pseudomonas - Sputum culture if able  - F/u CXR  - pulm hygiene - will add flutter  - Cont O2 as needed to keep sats 88-92% (at baseline 2L)  - no bronchospasm - no role steroids at this time  - BD's - change duonebs to scheduled, hold dulera for now  - Resp status appears stable - may want to also consider additional sources infection if does not improve quickly given WBC 35, fever 102, continued abd pain   POD #4 Splenectomy - large amt blood loss intra-op Anemia/ Thrombocytopenia - improved  GERD  Per Primary   WHITEHEART,KATHRYN, NP 03/01/2013  11:54 AM Pager: [phone] or [phone]  *Care during the described time interval was provided by me and/or other providers on the critical care team. I have reviewed this patient's available data, including medical history, events of note, physical examination and test results as part of my evaluation.  Agree with above, continue abx and pulmonary hygiene.  No steroids and no bronchodilators for now.  Patient is on 2L [place] at baseline.  Will continue to follow with you.  Will need immunization against encapsulated organisms prior to discharge.  Patient seen and examined, agree with above note.  I dictated the care and orders written for this patient under my direction.  Rush Farmer, MD [phone]

## 2013-03-01 NOTE — Progress Notes (Signed)
PT Cancellation Note  Patient Details Name: [name] MRN: [id number] DOB: 04/28/1950   Cancelled Treatment:    Reason Eval/Treat Not Completed: Medical issues which prohibited therapy.  Patient supine in bed with HOB elevated on O2.  Patient with 3/4 dyspnea at rest.  Patient requests to hold PT today.  Will return tomorrow.   Despina Pole 03/01/2013, 12:32 PM Carita Pian. Sanjuana Kava, [place] Pager [phone]

## 2013-03-01 NOTE — Progress Notes (Signed)
ANTIBIOTIC CONSULT NOTE - INITIAL  Pharmacy Consult for Vancocin and Primaxin Indication: rule out pneumonia  Allergies  Allergen Reactions  . Penicillins Anaphylaxis and Rash  . Codeine Phosphate Other (See Comments)    REACTION: Stomach cramps  . Hydrocodone-Acetaminophen Other (See Comments)    REACTION: hallucinations  . Morphine Other (See Comments)    REACTION: Lowers BP  . Other Other (See Comments)    AGENT:  Per pt, cannot take blood thinners due to cirrhosis of the liver  . Cephalexin Swelling and Rash    Patient Measurements: Height: 5' (152.4 cm) Weight: 189 lb 2.5 oz (85.8 kg) IBW/kg (Calculated) : 45.5  Vital Signs: Temp: 99.7 F (37.6 C) (02/02 2505) Temp src: Oral (02/02 0633) BP: 152/55 mmHg (02/02 0633) Pulse Rate: 98 (02/02 0633) Intake/Output from previous day: 02/01 0701 - 02/02 0700 In: 1231.1 [P.O.:480; I.V.:751.1] Out: 1480 [Urine:1400; Drains:80] Intake/Output from this shift: Total I/O In: -  Out: 740 [Urine:700; Drains:40]  Labs:  Recent Labs  02/27/13 0540  WBC 19.4*  HGB 8.3*  PLT 210  CREATININE 0.89   Estimated Creatinine Clearance: 64.6 ml/min (by C-G formula based on Cr of 0.89).   Microbiology: Recent Results (from the past 720 hour(s))  MRSA PCR SCREENING     Status: None   Collection Time    02/24/13 12:50 PM      Result Value Range Status   MRSA by PCR NEGATIVE  NEGATIVE Final   Comment:            The GeneXpert MRSA Assay (FDA     approved for NASAL specimens     only), is one component of a     comprehensive MRSA colonization     surveillance program. It is not     intended to diagnose MRSA     infection nor to guide or     monitor treatment for     MRSA infections.    Medical History: Past Medical History  Diagnosis Date  . Cirrhosis   . GERD (gastroesophageal reflux disease)   . Cervical disc syndrome   . Lung cancer     squamous cell  . Chronic respiratory failure   . Diverticulitis of colon   .  Perforation of colon   . Arthritis   . IBS (irritable bowel syndrome)   . Chronic lower GI bleeding   . Overactive bladder   . Cervical cancer   . Thrombocytopenia     sees Dr. Julien Nordmann   . Splenomegaly   . Anxiety   . Depression   . Hypertension   . Asthma   . Chronic headache disorder   . Blood transfusion without reported diagnosis   . Heart murmur   . Hyperlipidemia   . Lung cancer   . COPD (chronic obstructive pulmonary disease)     sees Dr. Gwenette Greet   . Anemia     sees Dr. Julien Nordmann, due to chronic disease and GI losses   . Diabetes mellitus     sees Dr. Cruzita Lederer   . Colostomy care     Medications:  Prescriptions prior to admission  Medication Sig Dispense Refill  . albuterol-ipratropium (COMBIVENT) 18-103 MCG/ACT inhaler Inhale 2 puffs into the lungs every 4 (four) hours as needed for wheezing or shortness of breath.       . cholecalciferol (VITAMIN D) 1000 UNITS tablet Take 1,000 Units by mouth daily.       . diazepam (VALIUM) 5 MG tablet Take 5 mg  by mouth 3 (three) times daily as needed for anxiety.      . dicyclomine (BENTYL) 10 MG capsule Take 1 capsule (10 mg total) by mouth 3 (three) times daily.  90 capsule  11  . esomeprazole (NEXIUM) 40 MG capsule Take 40 mg by mouth every evening.      . Fluticasone-Salmeterol (ADVAIR) 250-50 MCG/DOSE AEPB Inhale 1 puff into the lungs every 12 (twelve) hours.  60 each  11  . folic acid (FOLVITE) 1 MG tablet Take 1 mg by mouth every evening.       . furosemide (LASIX) 20 MG tablet Take 20-40 mg by mouth daily as needed for fluid.      [name] insulin glargine (LANTUS) 100 UNIT/ML injection Inject 0.6 mLs (60 Units total) into the skin 2 (two) times daily.  30 mL  12  . insulin lispro (HUMALOG) 100 UNIT/ML injection Inject 35-47 Units into the skin 3 (three) times daily before meals. Injects 47 units daily with breakfast, 35 units daily with lunch, and 45 units daily with supper.  40 mL  4  . metFORMIN (GLUCOPHAGE) 1000 MG tablet Take  1,000 mg by mouth 2 (two) times daily with a meal.      . metoCLOPramide (REGLAN) 10 MG tablet Take 10 mg by mouth 3 (three) times daily.      . Oxycodone HCl 20 MG TABS Take 1 tablet (20 mg total) by mouth every 3 (three) hours as needed (pain).  240 tablet  0  . potassium chloride (MICRO-K) 10 MEQ CR capsule Take 1 capsule (10 mEq total) by mouth daily.  90 capsule  3  . promethazine (PHENERGAN) 25 MG tablet Take 25 mg by mouth every 6 (six) hours as needed for nausea. For nausea      . rifaximin (XIFAXAN) 550 MG TABS tablet Take 550 mg by mouth 2 (two) times daily.      . traZODone (DESYREL) 100 MG tablet Take 2 tablets (200 mg total) by mouth at bedtime.  60 tablet  11  . Insulin Pen Needle (BD PEN NEEDLE NANO U/F) 32G X 4 MM MISC Use 4 times daily as directed.  150 each  3  . Insulin Pen Needle (EASY TOUCH PEN NEEDLES) 32G X 4 MM MISC Use as directed, diagnosis code is 250.00  100 each  3   Scheduled:  . ciprofloxacin  500 mg Oral BID  . insulin aspart  0-20 Units Subcutaneous TID WC  . insulin glargine  45 Units Subcutaneous BID  . mometasone-formoterol  2 puff Inhalation BID  . pantoprazole  40 mg Oral Daily  . polyethylene glycol  17 g Oral Daily    Assessment: 63yo female admitted 1/27 for splenectomy, developed fever w/ nonproductive cough overnight, CXR concerning for PNA, to begin IV ABX.  Goal of Therapy:  Vancomycin trough level 15-20 mcg/ml  Plan:  Will begin vancomycin 1000mg  IV Q12H and Primaxin 500mg  IV Q8H and monitor CBC, Cx, levels prn.  Wynona Neat, PharmD, BCPS  03/01/2013,6:35 AM

## 2013-03-02 ENCOUNTER — Inpatient Hospital Stay (HOSPITAL_COMMUNITY): Payer: Medicare Other

## 2013-03-02 LAB — URINE CULTURE
COLONY COUNT: NO GROWTH
CULTURE: NO GROWTH

## 2013-03-02 LAB — GLUCOSE, CAPILLARY
GLUCOSE-CAPILLARY: 178 mg/dL — AB (ref 70–99)
GLUCOSE-CAPILLARY: 167 mg/dL — AB (ref 70–99)
Glucose-Capillary: 149 mg/dL — ABNORMAL HIGH (ref 70–99)
Glucose-Capillary: 144 mg/dL — ABNORMAL HIGH (ref 70–99)

## 2013-03-02 LAB — PROCALCITONIN: Procalcitonin: 0.51 ng/mL

## 2013-03-02 NOTE — Progress Notes (Signed)
6 Days Post-Op  Subjective: Comfortable this morning Having incisional pain No SOB  Objective: Vital signs in last 24 hours: Temp:  [98.4 F (36.9 C)-98.5 F (36.9 C)] 98.5 F (36.9 C) (02/03 0516) Pulse Rate:  [93-107] 96 (02/03 0516) Resp:  [18-20] 20 (02/03 0516) BP: (142-153)/(47-74) 142/51 mmHg (02/03 0516) SpO2:  [97 %-100 %] 97 % (02/03 0108) Last BM Date: 02/24/13  Intake/Output from previous day: 02/02 0701 - 02/03 0700 In: 2348.8 [P.O.:480; I.V.:1168.8; IV Piggyback:700] Out: 2215 [Urine:2150; Drains:65] Intake/Output this shift: Total I/O In: 1050 [I.V.:750; IV Piggyback:300] Out: 915 [Urine:900; Drains:15]  Lungs mostly clear CV RRR Abdomen soft, incision clean, drain serous  Lab Results:   Recent Labs  03/01/13 0735  WBC 35.0*  HGB 8.6*  HCT 25.9*  PLT 434*   BMET  Recent Labs  03/01/13 0735  NA 135*  K 4.7  CL 100  CO2 20  GLUCOSE 151*  BUN 12  CREATININE 0.84  CALCIUM 8.7   PT/INR No results found for this basename: LABPROT, INR,  in the last 72 hours ABG No results found for this basename: PHART, PCO2, PO2, HCO3,  in the last 72 hours  Studies/Results: Dg Chest Port 1 View  02/28/2013   CLINICAL DATA:  Fever.  EXAM: PORTABLE CHEST - 1 VIEW  COMPARISON:  CT chest 11/04/2006 and PA and lateral chest 11/13/2012.  FINDINGS: There is new left basilar airspace disease. The right lung is clear. Heart size is normal. No pneumothorax is identified.  IMPRESSION: Left basilar airspace disease worrisome for pneumonia.   Electronically Signed   By: Inge Rise M.D.   On: 02/28/2013 23:39    Anti-infectives: Anti-infectives   Start     Dose/Rate Route Frequency Ordered Stop   03/01/13 0800  vancomycin (VANCOCIN) IVPB 1000 mg/200 mL premix     1,000 mg 200 mL/hr over 60 Minutes Intravenous Every 12 hours 03/01/13 0639     03/01/13 0645  imipenem-cilastatin (PRIMAXIN) 500 mg in sodium chloride 0.9 % 100 mL IVPB     500 mg 200 mL/hr over 30  Minutes Intravenous 3 times per day 03/01/13 9244     02/28/13 2315  ciprofloxacin (CIPRO) tablet 500 mg     500 mg Oral 2 times daily 02/28/13 2304     02/24/13 0600  ciprofloxacin (CIPRO) IVPB 400 mg     400 mg 200 mL/hr over 60 Minutes Intravenous On call to O.R. 02/23/13 1410 02/24/13 0845      Assessment/Plan: s/p Procedure(s): SPLENECTOMY (N/A)  Continuing antibiotics Working with PT Awaiting results of drain fluid Plan suppos down ostomy today Inc WBC may be reaction to splenectomy.  If persistently increases and fevers persists will CT chest and abd/pelvis  LOS: 6 days    Retta Pitcher A 03/02/2013

## 2013-03-02 NOTE — Consult Note (Signed)
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   Post op developed increased SOB, ?HCAP and PCCM consulted.   SIGNIFICANT EVENTS / STUDIES:  1/28>>> splenectomy   LINES / TUBES: none  CULTURES: Urine 2/2>>> Sputum 2/2>>>  ANTIBIOTICS: Cipro 2/1>>> Vanc 2/2>>> Primaxin 2/2>>>  SUBJECTIVE/INTERVAL HISTORY: No subjective change today. Unable to sleep last night r/t dyspnea. Duoneb was helpful.  Able to ambulate -satn 96% on 2L  VITAL SIGNS: Temp:  [98.4 F (36.9 C)-98.5 F (36.9 C)] 98.5 F (36.9 C) (02/03 0516) Pulse Rate:  [93-107] 96 (02/03 0516) Resp:  [18-20] 20 (02/03 0516) BP: (142-153)/(47-74) 142/51 mmHg (02/03 0516) SpO2:  [97 %-100 %] 98 % (02/03 0813)  PHYSICAL EXAMINATION: General:  Chronically ill appearing female, NAD in bed  Neuro:  Awake, alert, appropriate, weak  HEENT:  Mm dry, no JVD  Cardiovascular:  s1s2 rrr Lungs:  resps even, mildly labored with speaking, diminished L, few scattered rhonchi R Abdomen:  Soft, tender, diffusely, incision c/d Musculoskeletal:  Pale, warm and dry, scant BLE edema    Recent Labs Lab 02/26/13 0410 02/27/13 0540 03/01/13 0735  NA 142 137 135*  K 4.8 4.8 4.7  CL 109 104 100  CO2 22 21 20   BUN 18 14 12   CREATININE 1.00 0.89 0.84  GLUCOSE 211* 145* 151*    Recent Labs Lab 02/26/13 0410 02/27/13 0540 03/01/13 0735  HGB 8.4* 8.3* 8.6*  HCT 25.6* 24.7* 25.9*  WBC 23.2* 19.4* 35.0*  PLT 146* 210 434*   Dg Chest Portable 1 View  03/02/2013   CLINICAL DATA:  Shortness of breath, pneumonia, followup, history lung cancer, cirrhosis, cervical cancer, hypertension, asthma, COPD  EXAM: PORTABLE CHEST - 1 VIEW  COMPARISON:   Portable exam 0605 hr compared to 02/28/2013  FINDINGS: Enlargement of cardiac silhouette.  Volume loss in left chest with mild elevation of left diaphragm.  Persistent left lower lobe consolidation with small pleural effusion.  Bronchitic changes with minimal right basilar atelectasis.  Upper lungs clear.  No pneumothorax.  IMPRESSION: Left lower lobe consolidation consistent with pneumonia, little changed.  Small left pleural effusion and minimal right basilar atelectasis.   Electronically Signed   By: Lavonia Dana M.D.   On: 03/02/2013 07:47   Dg Chest Port 1 View  02/28/2013   CLINICAL DATA:  Fever.  EXAM: PORTABLE CHEST - 1 VIEW  COMPARISON:  CT chest 11/04/2006 and PA and lateral chest 11/13/2012.  FINDINGS: There is new left basilar airspace disease. The right lung is clear. Heart size is normal. No pneumothorax is identified.  IMPRESSION: Left basilar airspace disease worrisome for pneumonia.   Electronically Signed   By: Inge Rise M.D.   On: 02/28/2013 23:39    ASSESSMENT / PLAN:  COPD without acute exacerbation  HCAP vs atelectasis LLL, low pct reassuring Hx lung ca s/p resection LUL   REC -  - cont broad spectrum abx for HCAP as above (pcn allergic), double covg for pseudomonas - Sputum culture if able   - pulm hygiene - will add flutter  - Cont O2 as needed to keep sats 88-92% (at baseline 2L)  - no bronchospasm - no role steroids at this time  -  Cont BDs - check CBC in am, monitor fever curve - Resp status appears stable - may want to also consider additional sources infection if does not improve quickly given WBC 35, fever 102, continued abd pain  - Surgery plans to CT chest, abd/pelvis if fever and wbc do not resolve  POD #4 Splenectomy - large amt blood loss intra-op Anemia/ Thrombocytopenia - improved  GERD  Per Primary    Georgann Housekeeper, ACNP [facility] Pager [phone] or [phone]  Independently examined pt, evaluated data &  formulated above care plan with NP who scribed this note & edited by me.  [place]  [phone]

## 2013-03-02 NOTE — Progress Notes (Signed)
Physical Therapy Treatment Patient Details Name: [name] MRN: [id number] DOB: [birth year] Today's Date: 03/02/2013 Time: 1129-1201 PT Time Calculation (min): 32 min  PT Assessment / Plan / Recommendation  History of Present Illness pt admitted for evaluation f splenectomy.     PT Comments   Pt admitted with above. Pt currently with functional limitations due to the deficits listed below (see PT Problem List). Pt did not have significant vestibular issues to treat them.  Progressing mobility.   Pt will benefit from skilled PT to increase their independence and safety with mobility to allow discharge to the venue listed below.   Follow Up Recommendations  Home health PT;Supervision/Assistance - 24 hour                 Equipment Recommendations  Hospital bed (tub/shower seat)        Frequency Min 3X/week   Progress towards PT Goals Progress towards PT goals: Progressing toward goals  Plan Current plan remains appropriate    Precautions / Restrictions Precautions Precautions: Fall Precaution Comments: previous fall, describes dizziness consistent with vestibular pathology Restrictions Weight Bearing Restrictions: No   Pertinent Vitals/Pain Sats 96% on 2LO2, no pain    Mobility  Bed Mobility Overal bed mobility: Needs Assistance Bed Mobility: Supine to Sit Supine to sit: Min assist General bed mobility comments: Pt needed some assist with moving LEs off bed and with elevation of trunk.  Pain in abdomen per pt.Tested pt for BPPV.  Negative for posterior and anterior canal.  Positive for possible right horizontal canal canalithiasis.  Explained treatment to pt and why it would be difficult to treat with her colostomy and pt states that her dizziness is so minor that she does not feel like it bothers her enough to worry about treatment at present time.   Transfers Overall transfer level: Modified independent Equipment used: Rolling walker (2 wheeled) Transfers: Sit to/from  Stand Sit to Stand: Supervision;Modified independent (Device/Increase time) General transfer comment: able to scoot out to and back from [place] and able to sit/stand with UE support at toilet and recliner unassisted Ambulation/Gait Ambulation/Gait assistance: Min guard Ambulation Distance (Feet): 450 Feet Assistive device: Rolling walker (2 wheeled) Gait Pattern/deviations: Step-to pattern;Decreased step length - right;Decreased step length - left;Decreased stride length;Trunk flexed;Wide base of support Gait velocity: 0.9 ft/sec Gait velocity interpretation: <1.8 ft/sec, indicative of risk for recurrent falls General Gait Details: Pt does well with RW in general.   Pt cannot withstand challenges to balance but does well without challenges with RW.  Pt ambulated on 2LO2 with sats at 96%.      PT Goals (current goals can now be found in the care plan section)    Visit Information  Last PT Received On: 03/02/13 Assistance Needed: +1 History of Present Illness: pt admitted for evaluation f splenectomy.      Subjective Data  Subjective: "I feel better today."   Cognition  Cognition Arousal/Alertness: Awake/alert Behavior During Therapy: WFL for tasks assessed/performed Overall Cognitive Status: Within Functional Limits for tasks assessed    Balance  Balance Overall balance assessment: Needs assistance;History of Falls Postural control: Posterior lean Standing balance support: No upper extremity supported;Bilateral upper extremity supported;During functional activity Standing balance-Leahy Scale: Fair Standing balance comment: Pt was standing at sink to wash her hands.  She lost her balance posteriorly x1 needing min assist to recover.    End of Session PT - End of Session Equipment Utilized During Treatment: Oxygen;Gait belt (2 L per [place]) Activity  Tolerance: Patient tolerated treatment well Patient left: in chair;with call bell/phone within reach Nurse Communication: Mobility status         INGOLD,Yuchen Fedor 03/02/2013, 3:43 PM [facility] [phone] (pager)

## 2013-03-03 ENCOUNTER — Inpatient Hospital Stay (HOSPITAL_COMMUNITY): Payer: Medicare Other

## 2013-03-03 ENCOUNTER — Encounter (HOSPITAL_COMMUNITY): Payer: Self-pay | Admitting: Radiology

## 2013-03-03 DIAGNOSIS — N179 Acute kidney failure, unspecified: Secondary | ICD-10-CM

## 2013-03-03 LAB — CBC
HEMATOCRIT: 22.9 % — AB (ref 36.0–46.0)
HEMOGLOBIN: 7.4 g/dL — AB (ref 12.0–15.0)
MCH: 28.9 pg (ref 26.0–34.0)
MCHC: 32.3 g/dL (ref 30.0–36.0)
MCV: 89.5 fL (ref 78.0–100.0)
Platelets: 616 10*3/uL — ABNORMAL HIGH (ref 150–400)
RBC: 2.56 MIL/uL — AB (ref 3.87–5.11)
RDW: 14.4 % (ref 11.5–15.5)
WBC: 29.2 10*3/uL — ABNORMAL HIGH (ref 4.0–10.5)

## 2013-03-03 LAB — GLUCOSE, CAPILLARY
GLUCOSE-CAPILLARY: 128 mg/dL — AB (ref 70–99)
GLUCOSE-CAPILLARY: 166 mg/dL — AB (ref 70–99)
GLUCOSE-CAPILLARY: 165 mg/dL — AB (ref 70–99)
Glucose-Capillary: 125 mg/dL — ABNORMAL HIGH (ref 70–99)

## 2013-03-03 LAB — MISCELLANEOUS TEST

## 2013-03-03 LAB — PREPARE RBC (CROSSMATCH)

## 2013-03-03 MED ORDER — IOHEXOL 300 MG/ML  SOLN
25.0000 mL | INTRAMUSCULAR | Status: AC
  Administered 2013-03-03 (×2): 25 mL via ORAL

## 2013-03-03 MED ORDER — IOHEXOL 300 MG/ML  SOLN
100.0000 mL | Freq: Once | INTRAMUSCULAR | Status: AC | PRN
  Administered 2013-03-03: 100 mL via INTRAVENOUS

## 2013-03-03 NOTE — Progress Notes (Signed)
7 Days Post-Op  Subjective: Having incisional pain, but otherwise improving Not taking in much po Ostomy starting to work more  Objective: Vital signs in last 24 hours: Temp:  [98.7 F (37.1 C)-98.8 F (37.1 C)] 98.8 F (37.1 C) (02/03 2226) Pulse Rate:  [88-90] 88 (02/03 2226) Resp:  [18-19] 18 (02/03 2226) BP: (122-126)/(44-54) 126/54 mmHg (02/03 2226) SpO2:  [97 %-99 %] 97 % (02/03 2226) Last BM Date: 02/24/13  Intake/Output from previous day: 02/03 0701 - 02/04 0700 In: -  Out: 1460 [Urine:1450; Drains:10] Intake/Output this shift: Total I/O In: -  Out: 650 [Urine:650] Lab Results:   Lungs clear Abdomen soft, minimally tender, drain with minimal output  Recent Labs  03/01/13 0735 03/03/13 0455  WBC 35.0* 29.2*  HGB 8.6* 7.4*  HCT 25.9* 22.9*  PLT 434* 616*   BMET  Recent Labs  03/01/13 0735  NA 135*  K 4.7  CL 100  CO2 20  GLUCOSE 151*  BUN 12  CREATININE 0.84  CALCIUM 8.7   PT/INR No results found for this basename: LABPROT, INR,  in the last 72 hours ABG No results found for this basename: PHART, PCO2, PO2, HCO3,  in the last 72 hours  Studies/Results: Dg Chest Portable 1 View  03/02/2013   CLINICAL DATA:  Shortness of breath, pneumonia, followup, history lung cancer, cirrhosis, cervical cancer, hypertension, asthma, COPD  EXAM: PORTABLE CHEST - 1 VIEW  COMPARISON:  Portable exam 0605 hr compared to 02/28/2013  FINDINGS: Enlargement of cardiac silhouette.  Volume loss in left chest with mild elevation of left diaphragm.  Persistent left lower lobe consolidation with small pleural effusion.  Bronchitic changes with minimal right basilar atelectasis.  Upper lungs clear.  No pneumothorax.  IMPRESSION: Left lower lobe consolidation consistent with pneumonia, little changed.  Small left pleural effusion and minimal right basilar atelectasis.   Electronically Signed   By: Lavonia Dana M.D.   On: 03/02/2013 07:47    Anti-infectives: Anti-infectives   Start     Dose/Rate Route Frequency Ordered Stop   03/01/13 0800  vancomycin (VANCOCIN) IVPB 1000 mg/200 mL premix     1,000 mg 200 mL/hr over 60 Minutes Intravenous Every 12 hours 03/01/13 0639     03/01/13 0645  imipenem-cilastatin (PRIMAXIN) 500 mg in sodium chloride 0.9 % 100 mL IVPB     500 mg 200 mL/hr over 30 Minutes Intravenous 3 times per day 03/01/13 9381     02/28/13 2315  ciprofloxacin (CIPRO) tablet 500 mg     500 mg Oral 2 times daily 02/28/13 2304     02/24/13 0600  ciprofloxacin (CIPRO) IVPB 400 mg     400 mg 200 mL/hr over 60 Minutes Intravenous On call to O.R. 02/23/13 1410 02/24/13 0845      Assessment/Plan: s/p Procedure(s): SPLENECTOMY (N/A)  I suspect anemia is multifactorial.  A combo of chronic disease, dilution, and blood loss at surgery.  Her platelet count has responded well.  Will transfuse 2 more units PRBC's Her WBC has decreased, but I am going to still check a CT scan prior to removing her drain  LOS: 7 days    Maclovia Uher A 03/03/2013

## 2013-03-03 NOTE — Progress Notes (Signed)
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   Post op developed increased SOB, ?HCAP and PCCM consulted.   SIGNIFICANT EVENTS / STUDIES:  1/28>>> splenectomy   LINES / TUBES: none  CULTURES: Urine 2/2>>>neg Sputum 2/2>>>  ANTIBIOTICS: Cipro 2/1>>> Vanc 2/2>>> Primaxin 2/2>>>  SUBJECTIVE/INTERVAL HISTORY: No change.  C/o mild SOB.   VITAL SIGNS: Temp:  [98.7 F (37.1 C)-98.8 F (37.1 C)] 98.7 F (37.1 C) (02/04 0631) Pulse Rate:  [88-90] 88 (02/04 0631) Resp:  [17-19] 17 (02/04 0631) BP: (122-136)/(44-58) 136/58 mmHg (02/04 0631) SpO2:  [97 %-99 %] 97 % (02/04 0631)  PHYSICAL EXAMINATION: General:  Chronically ill appearing female, NAD in bed  Neuro:  Awake, alert, appropriate, weak  HEENT:  Mm dry, no JVD  Cardiovascular:  s1s2 rrr Lungs:  resps even, non labored, diminished L, few scattered rhonchi R Abdomen:  Slightly distended, tender, diffusely, incision c/d Musculoskeletal:  Pale, warm and dry, scant BLE edema    Recent Labs Lab 02/26/13 0410 02/27/13 0540 03/01/13 0735  NA 142 137 135*  K 4.8 4.8 4.7  CL 109 104 100  CO2 22 21 20   BUN 18 14 12   CREATININE 1.00 0.89 0.84  GLUCOSE 211* 145* 151*    Recent Labs Lab 02/27/13 0540 03/01/13 0735 03/03/13 0455  HGB 8.3* 8.6* 7.4*  HCT 24.7* 25.9* 22.9*  WBC 19.4* 35.0* 29.2*  PLT 210 434* 616*   Dg Chest Portable 1 View  03/02/2013   CLINICAL DATA:  Shortness of breath, pneumonia, followup, history lung cancer, cirrhosis, cervical cancer, hypertension, asthma, COPD  EXAM: PORTABLE CHEST - 1 VIEW  COMPARISON:  Portable exam 0605 hr compared to 02/28/2013  FINDINGS: Enlargement of cardiac silhouette.  Volume  loss in left chest with mild elevation of left diaphragm.  Persistent left lower lobe consolidation with small pleural effusion.  Bronchitic changes with minimal right basilar atelectasis.  Upper lungs clear.  No pneumothorax.  IMPRESSION: Left lower lobe consolidation consistent with pneumonia, little changed.  Small left pleural effusion and minimal right basilar atelectasis.   Electronically Signed   By: Lavonia Dana M.D.   On: 03/02/2013 07:47    ASSESSMENT / PLAN:  COPD without acute exacerbation  HCAP vs atelectasis LLL, low pct reassuring Hx lung ca s/p resection LUL   REC -  - continue broad spectrum abx for HCAP as above (pcn allergic), double covg for pseudomonas - pulm hygiene  - Cont O2 as needed to keep sats 88-92% (at baseline 2L)  - no bronchospasm - no role steroids at this time  - Continue BDs - f/u CBC - Surgery plans to CT chest, abd/pelvis 2/4 with continued leukocytosis  -Simplify abx once WC down  POD #4 Splenectomy - large amt blood loss intra-op Acute blood loss Anemia/ Thrombocytopenia - improved  GERD  Per Primary  For CT abd/pelvis 2/4 Transfusion planned, can accept Hb 7 & above   WHITEHEART,KATHRYN, NP 03/03/2013  9:57 AM Pager: [phone] or [phone]  *Care during the described time interval was provided by me and/or other providers on the critical care team. I have reviewed this patient's available data, including medical history, events of note, physical examination and test results  as part of my evaluation.  ALVA,RAKESH V.

## 2013-03-04 DIAGNOSIS — D649 Anemia, unspecified: Secondary | ICD-10-CM

## 2013-03-04 LAB — TYPE AND SCREEN
ABO/RH(D): A POS
Antibody Screen: NEGATIVE
UNIT DIVISION: 0
Unit division: 0

## 2013-03-04 LAB — GLUCOSE, CAPILLARY
Glucose-Capillary: 105 mg/dL — ABNORMAL HIGH (ref 70–99)
Glucose-Capillary: 130 mg/dL — ABNORMAL HIGH (ref 70–99)
Glucose-Capillary: 137 mg/dL — ABNORMAL HIGH (ref 70–99)
Glucose-Capillary: 156 mg/dL — ABNORMAL HIGH (ref 70–99)

## 2013-03-04 LAB — BASIC METABOLIC PANEL
BUN: 13 mg/dL (ref 6–23)
CALCIUM: 8.9 mg/dL (ref 8.4–10.5)
CO2: 17 mEq/L — ABNORMAL LOW (ref 19–32)
Chloride: 104 mEq/L (ref 96–112)
Creatinine, Ser: 0.82 mg/dL (ref 0.50–1.10)
GFR calc Af Amer: 88 mL/min — ABNORMAL LOW (ref >90)
GFR, EST NON AFRICAN AMERICAN: 76 mL/min — AB (ref >90)
Glucose, Bld: 106 mg/dL — ABNORMAL HIGH (ref 70–99)
Potassium: 4.6 mEq/L (ref 3.7–5.3)
SODIUM: 138 meq/L (ref 137–147)

## 2013-03-04 LAB — CBC
HCT: 31.2 % — ABNORMAL LOW (ref 36.0–46.0)
Hemoglobin: 10.4 g/dL — ABNORMAL LOW (ref 12.0–15.0)
MCH: 30.1 pg (ref 26.0–34.0)
MCHC: 33.3 g/dL (ref 30.0–36.0)
MCV: 90.4 fL (ref 78.0–100.0)
PLATELETS: 668 10*3/uL — AB (ref 150–400)
RBC: 3.45 MIL/uL — ABNORMAL LOW (ref 3.87–5.11)
RDW: 14.5 % (ref 11.5–15.5)
WBC: 24.4 10*3/uL — ABNORMAL HIGH (ref 4.0–10.5)

## 2013-03-04 LAB — PROCALCITONIN: Procalcitonin: 0.38 ng/mL

## 2013-03-04 LAB — LIPASE, BLOOD: Lipase: 54 U/L (ref 11–59)

## 2013-03-04 MED ORDER — MENINGOCOCCAL VAC A,C,Y,W-135 ~~LOC~~ INJ
0.5000 mL | INJECTION | Freq: Once | SUBCUTANEOUS | Status: DC
  Filled 2013-03-04: qty 0.5

## 2013-03-04 MED ORDER — ASPIRIN 325 MG PO TABS
325.0000 mg | ORAL_TABLET | Freq: Every day | ORAL | Status: DC
Start: 2013-03-04 — End: 2013-03-08
  Administered 2013-03-04 – 2013-03-08 (×5): 325 mg via ORAL
  Filled 2013-03-04 (×5): qty 1

## 2013-03-04 MED ORDER — ENOXAPARIN SODIUM 40 MG/0.4ML ~~LOC~~ SOLN
40.0000 mg | Freq: Two times a day (BID) | SUBCUTANEOUS | Status: DC
  Administered 2013-03-04 – 2013-03-08 (×8): 40 mg via SUBCUTANEOUS
  Filled 2013-03-04 (×12): qty 0.4

## 2013-03-04 MED ORDER — PNEUMOCOCCAL 13-VAL CONJ VACC IM SUSP
0.5000 mL | INTRAMUSCULAR | Status: DC
  Filled 2013-03-04: qty 0.5

## 2013-03-04 MED ORDER — INFLUENZA VAC SPLIT QUAD 0.5 ML IM SUSP
0.5000 mL | INTRAMUSCULAR | Status: DC
  Filled 2013-03-04: qty 0.5

## 2013-03-04 NOTE — Progress Notes (Signed)
Physical Therapy Treatment Patient Details Name: [name] MRN: [id number] DOB: September 11, 1950 Today's Date: 03/04/2013 Time: 5885-0277 PT Time Calculation (min): 23 min  PT Assessment / Plan / Recommendation  History of Present Illness pt admitted for evaluation f splenectomy.     PT Comments   Patient progressing with mobility this session. Patient stated that she may go home tomorrow. Patient did use O2 for ambulation this session and she felt more out of breathe with increased stomach pain. Patient stated she used o2 at home as needed. Will practice steps tomorrow.   Follow Up Recommendations  Home health PT;Supervision/Assistance - 24 hour     Does the patient have the potential to tolerate intense rehabilitation     Barriers to Discharge        Equipment Recommendations  Hospital bed    Recommendations for Other Services    Frequency Min 3X/week   Progress towards PT Goals Progress towards PT goals: Progressing toward goals  Plan Current plan remains appropriate    Precautions / Restrictions Precautions Precautions: Fall   Pertinent Vitals/Pain Complained of pain but did not rate. RN made aware    Mobility  Transfers Overall transfer level: Modified independent Ambulation/Gait Ambulation/Gait assistance: Supervision Ambulation Distance (Feet): 400 Feet Assistive device: Rolling walker (2 wheeled) Gait velocity interpretation: <1.8 ft/sec, indicative of risk for recurrent falls General Gait Details: Patient ambulating safely with RW. Did not attempt without O2 this session as patient with increased pain and SOB this session    Exercises     PT Diagnosis:    PT Problem List:   PT Treatment Interventions:     PT Goals (current goals can now be found in the care plan section)    Visit Information  Last PT Received On: 03/04/13 Assistance Needed: +1 History of Present Illness: pt admitted for evaluation f splenectomy.      Subjective Data       Cognition  Cognition Arousal/Alertness: Awake/alert Behavior During Therapy: WFL for tasks assessed/performed Overall Cognitive Status: Within Functional Limits for tasks assessed    Balance     End of Session PT - End of Session Equipment Utilized During Treatment: Oxygen;Gait belt Activity Tolerance: Patient tolerated treatment well Patient left: in chair;with call bell/phone within reach Nurse Communication: Mobility status   GP     Jacqualyn Posey 03/04/2013, 2:03 PM 03/04/2013 Jacqualyn Posey PTA [phone] pager [phone] office

## 2013-03-04 NOTE — Progress Notes (Signed)
ANTIBIOTIC CONSULT NOTE - INITIAL  Pharmacy Consult for Vancocin and Primaxin Indication: rule out pneumonia   Patient Measurements: Height: 5' (152.4 cm) Weight: 189 lb 2.5 oz (85.8 kg) IBW/kg (Calculated) : 45.5  Vital Signs: Temp: 98.7 F (37.1 C) (02/05 0559) BP: 124/54 mmHg (02/05 0559) Pulse Rate: 81 (02/05 0559) Intake/Output from previous day: 02/04 0701 - 02/05 0700 In: 500 [IV Piggyback:500] Out: 1438 [Urine:1550] Intake/Output from this shift:    Labs:  Recent Labs  03/03/13 0455 03/04/13 0535  WBC 29.2* 24.4*  HGB 7.4* 10.4*  PLT 616* 668*  CREATININE  --  0.82   Estimated Creatinine Clearance: 70.1 ml/min (by C-G formula based on Cr of 0.82).   Microbiology: Recent Results (from the past 720 hour(s))  MRSA PCR SCREENING     Status: None   Collection Time    02/24/13 12:50 PM      Result Value Range Status   MRSA by PCR NEGATIVE  NEGATIVE Final   Comment:            The GeneXpert MRSA Assay (FDA     approved for NASAL specimens     only), is one component of a     comprehensive MRSA colonization     surveillance program. It is not     intended to diagnose MRSA     infection nor to guide or     monitor treatment for     MRSA infections.  URINE CULTURE     Status: None   Collection Time    03/01/13  2:52 AM      Result Value Range Status   Specimen Description URINE, CLEAN CATCH   Final   Special Requests NONE   Final   Culture  Setup Time     Final   Value: 03/01/2013 00:00     Performed at [facility] Count     Final   Value: NO GROWTH     Performed at [facility]   Culture     Final   Value: NO GROWTH     Performed at [facility]   Report Status 03/02/2013 FINAL   Final    Assessment: 63yo female admitted 1/27 for splenectomy, developed fever w/ nonproductive cough, CXR concerning for PNA.  Has been on vancomycin and imipenem since 2/2.  MD plans to discharge her 2/6 on oral antibiotics.  Goal  of Therapy:  Vancomycin trough level 15-20 mcg/ml  Plan:  Continue vancomycin and imipenem at the same doses.  Will not check a vancomycin level today since therapy is going to be discontinued 2/6.  Will need a level soon if antibiotic plans change.  Heide Guile, PharmD, BCPS Clinical Pharmacist Pager [phone]   03/04/2013,10:41 AM

## 2013-03-04 NOTE — Progress Notes (Signed)
8 Days Post-Op  Subjective: She denies SOB All her abdominal pain is incisional.  She has no pain anywhere else  Objective: Vital signs in last 24 hours: Temp:  [98.4 F (36.9 C)-99.3 F (37.4 C)] 98.7 F (37.1 C) (02/05 0559) Pulse Rate:  [81-97] 81 (02/05 0559) Resp:  [17-20] 19 (02/05 0559) BP: (118-154)/(45-57) 124/54 mmHg (02/05 0559) SpO2:  [90 %-100 %] 98 % (02/05 0559) Last BM Date: 02/24/13  Intake/Output from previous day: 02/04 0701 - 02/05 0700 In: 500 [IV Piggyback:500] Out: 1550 [Urine:1550] Intake/Output this shift:    Lungs with decrease left base Abdomen soft, non tender, drain seropurulent  Lab Results:   Recent Labs  03/03/13 0455 03/04/13 0535  WBC 29.2* 24.4*  HGB 7.4* 10.4*  HCT 22.9* 31.2*  PLT 616* 668*   BMET  Recent Labs  03/04/13 0535  NA 138  K 4.6  CL 104  CO2 17*  GLUCOSE 106*  BUN 13  CREATININE 0.82  CALCIUM 8.9   PT/INR No results found for this basename: LABPROT, INR,  in the last 72 hours ABG No results found for this basename: PHART, PCO2, PO2, HCO3,  in the last 72 hours  Studies/Results: Ct Abdomen Pelvis W Contrast  03/03/2013   CLINICAL DATA:  Postop splenectomy on 02/24/2013, now with leukocytosis.  EXAM: CT ABDOMEN AND PELVIS WITH CONTRAST  TECHNIQUE: Multidetector CT imaging of the abdomen and pelvis was performed using the standard protocol following bolus administration of intravenous contrast.  CONTRAST:  161mL OMNIPAQUE IOHEXOL 300 MG/ML IV. Oral contrast was also administered.  COMPARISON:  DG ABD PORTABLE 1V dated 02/25/2013; CT BIOPSY dated 12/17/2012; CT ABD/PELVIS W CM dated 11/06/2012; CT ABD/PELVIS W CM dated 03/17/2011; DG ABD ACUTE W/CHEST dated 03/17/2011  FINDINGS: The imaging field of view on the the axial images cut off the anterior extent of several of the images, but the anterior abdomen was included on the coronal and sagittal reconstructed images.  Surgical drain in the left upper quadrant the  abdomen which courses through a fluid collection with an enhancing wall immediately posterior to the stomach in the splenectomy bed. The fluid collection measures maximally approximately 6 x 8 x 5 cm. Minimal free fluid in the upper left paracolic gutter and in the anterior left pararenal space. No evidence of ascites elsewhere. No abnormal fluid collections elsewhere.  Edema/inflammation involving the body and tail of the pancreas, with peripancreatic fluid. The entire pancreas enhances normally. Normal liver. Prior cholecystectomy. No biliary ductal dilation. Filling defect within the splenic vein extending into the portal vein, though the portal vein remains patent. Normal adrenal glands and kidneys. 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 Visceral arteries patent.  Stomach decompressed and unremarkable. Normal-appearing small bowel. Large stool burden throughout the colon. 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 Decompressed Hartmann pouch containing high attenuation stool like material as noted on prior examinations. Thinning of the anterior rectus sheath, without evidence of abdominal wall hernia (apart from the parastomal hernia).  Urinary bladder unremarkable. Uterus atrophic. No adnexal masses or free pelvic fluid. Large phleboliths low in the left side of the pelvis, with smaller phleboliths elsewhere in both sides of the pelvis.  Bone window images unremarkable apart from facet degenerative changes at L5-S1. Moderate size left pleural effusion with dense passive atelectasis in the left lower lobe. Minimal atelectasis deep in the visualized right lower lobe. Heart size upper normal.  IMPRESSION: 1.  Localized postoperative fluid collection in the left upper quadrant, posterior to the liver, possibly abscess. The surgical drain passes through this collection. Measurements are given above. 2.  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 3. Possible acute pancreatitis. Please correlate with serum amylase and lipase. 4. Very small amount of ascites. 5. Descending colostomy with parastomal hernia containing a loop of normal-appearing small bowel and fat. 6. Moderate-sized left pleural effusion and associated dense passive atelectasis in the left lower lobe. Minimal atelectasis deep in the right lower lobe. These results will be called to the ordering clinician or representative by the Radiologist Assistant, and communication documented in the PACS Dashboard.   Electronically Signed   By: Evangeline Dakin M.D.   On: 03/03/2013 11:31    Anti-infectives: Anti-infectives   Start     Dose/Rate Route Frequency Ordered Stop   03/01/13 0800  vancomycin (VANCOCIN) IVPB 1000 mg/200 mL premix     1,000 mg 200 mL/hr over 60 Minutes Intravenous Every 12 hours 03/01/13 0639     03/01/13 0645  imipenem-cilastatin (PRIMAXIN) 500 mg in sodium chloride 0.9 % 100 mL IVPB     500 mg 200 mL/hr over 30 Minutes Intravenous 3 times per day 03/01/13 6387     02/28/13 2315  ciprofloxacin (CIPRO) tablet 500 mg     500 mg Oral 2 times daily 02/28/13 2304     02/24/13 0600  ciprofloxacin (CIPRO) IVPB 400 mg     400 mg 200 mL/hr over 60 Minutes Intravenous On call to O.R. 02/23/13 1410 02/24/13 0845      Assessment/Plan: s/p Procedure(s): SPLENECTOMY (N/A)  Incentive spirometry Continue antibiotics.  She wants to go home tomorrow which is reasonable.  Will change to oral antibiotics at discharge.  She is already on oxygen at home CT reviewed.  Findings not a surprise.  The drain appears to be in the fluid collection.  Will leave the drain in place at discharge.  As far as the partial portal vein thrombus is concerned, I am afraid she is at a higher risk of bleeding.  I will at least place her on Asprin.  I will also start daily lovenox  until discharge.  LOS: 8 days    Ohm Dentler A 03/04/2013

## 2013-03-04 NOTE — Progress Notes (Signed)
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   Post op developed increased SOB, ?HCAP and PCCM consulted.   SIGNIFICANT EVENTS / STUDIES:  1/28>>> splenectomy   LINES / TUBES: none  CULTURES: Urine 2/2>>>neg Sputum 2/2>>>  ANTIBIOTICS: Cipro 2/1>>> Vanc 2/2>>> Primaxin 2/2>>>  SUBJECTIVE/INTERVAL HISTORY: Dyspnea better C/o abd pain  VITAL SIGNS: Temp:  [98.4 F (36.9 C)-99.3 F (37.4 C)] 98.7 F (37.1 C) (02/05 0559) Pulse Rate:  [81-97] 81 (02/05 0559) Resp:  [18-20] 19 (02/05 0559) BP: (124-143)/(46-57) 124/54 mmHg (02/05 0559) SpO2:  [93 %-98 %] 98 % (02/05 1408)  PHYSICAL EXAMINATION: General:  Chronically ill appearing female, NAD in bed  Neuro:  Awake, alert, appropriate, weak  HEENT:  Mm dry, no JVD  Cardiovascular:  s1s2 rrr Lungs:  resps even, non labored, diminished L, few scattered rhonchi R Abdomen:  Slightly distended, tender, diffusely, incision c/d Musculoskeletal:  Pale, warm and dry, scant BLE edema    Recent Labs Lab 02/27/13 0540 03/01/13 0735 03/04/13 0535  NA 137 135* 138  K 4.8 4.7 4.6  CL 104 100 104  CO2 21 20 17*  BUN 14 12 13   CREATININE 0.89 0.84 0.82  GLUCOSE 145* 151* 106*    Recent Labs Lab 03/01/13 0735 03/03/13 0455 03/04/13 0535  HGB 8.6* 7.4* 10.4*  HCT 25.9* 22.9* 31.2*  WBC 35.0* 29.2* 24.4*  PLT 434* 616* 668*   Ct Abdomen Pelvis W Contrast  03/03/2013   CLINICAL DATA:  Postop splenectomy on 02/24/2013, now with leukocytosis.  EXAM: CT ABDOMEN AND PELVIS WITH CONTRAST  TECHNIQUE: Multidetector CT imaging of the abdomen and pelvis was performed using the standard protocol following bolus administration of intravenous  contrast.  CONTRAST:  184mL OMNIPAQUE IOHEXOL 300 MG/ML IV. Oral contrast was also administered.  COMPARISON:  DG ABD PORTABLE 1V dated 02/25/2013; CT BIOPSY dated 12/17/2012; CT ABD/PELVIS W CM dated 11/06/2012; CT ABD/PELVIS W CM dated 03/17/2011; DG ABD ACUTE W/CHEST dated 03/17/2011  FINDINGS: The imaging field of view on the the axial images cut off the anterior extent of several of the images, but the anterior abdomen was included on the coronal and sagittal reconstructed images.  Surgical drain in the left upper quadrant the abdomen which courses through a fluid collection with an enhancing wall immediately posterior to the stomach in the splenectomy bed. The fluid collection measures maximally approximately 6 x 8 x 5 cm. Minimal free fluid in the upper left paracolic gutter and in the anterior left pararenal space. No evidence of ascites elsewhere. No abnormal fluid collections elsewhere.  Edema/inflammation involving the body and tail of the pancreas, with peripancreatic fluid. The entire pancreas enhances normally. Normal liver. Prior cholecystectomy. No biliary ductal dilation. Filling defect within the splenic vein extending into the portal vein, though the portal vein remains patent. Normal adrenal glands and kidneys. 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 Visceral arteries patent.  Stomach decompressed and unremarkable. Normal-appearing small bowel. Large stool burden throughout the colon. 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 Decompressed Hartmann pouch containing high attenuation stool like material as noted on prior examinations. Thinning of the anterior rectus sheath, without  evidence of abdominal wall hernia (apart from the parastomal hernia).  Urinary bladder unremarkable. Uterus atrophic. No adnexal masses or free pelvic fluid. Large phleboliths low in the left side of the pelvis, with  smaller phleboliths elsewhere in both sides of the pelvis.  Bone window images unremarkable apart from facet degenerative changes at L5-S1. Moderate size left pleural effusion with dense passive atelectasis in the left lower lobe. Minimal atelectasis deep in the visualized right lower lobe. Heart size upper normal.  IMPRESSION: 1. Localized postoperative fluid collection in the left upper quadrant, posterior to the liver, possibly abscess. The surgical drain passes through this collection. Measurements are given above. 2. 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 3. Possible acute pancreatitis. Please correlate with serum amylase and lipase. 4. Very small amount of ascites. 5. Descending colostomy with parastomal hernia containing a loop of normal-appearing small bowel and fat. 6. Moderate-sized left pleural effusion and associated dense passive atelectasis in the left lower lobe. Minimal atelectasis deep in the right lower lobe. These results will be called to the ordering clinician or representative by the Radiologist Assistant, and communication documented in the PACS Dashboard.   Electronically Signed   By: Evangeline Dakin M.D.   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 03/04/2013  5:21 PM  [phone]

## 2013-03-05 LAB — GLUCOSE, CAPILLARY
GLUCOSE-CAPILLARY: 87 mg/dL (ref 70–99)
GLUCOSE-CAPILLARY: 137 mg/dL — AB (ref 70–99)
Glucose-Capillary: 81 mg/dL (ref 70–99)
Glucose-Capillary: 135 mg/dL — ABNORMAL HIGH (ref 70–99)

## 2013-03-05 MED ORDER — IPRATROPIUM-ALBUTEROL 0.5-2.5 (3) MG/3ML IN SOLN
3.0000 mL | Freq: Three times a day (TID) | RESPIRATORY_TRACT | Status: DC
  Administered 2013-03-05 – 2013-03-07 (×6): 3 mL via RESPIRATORY_TRACT
  Filled 2013-03-05 (×6): qty 3

## 2013-03-05 MED ORDER — MENINGOCOCCAL VAC A,C,Y,W-135 ~~LOC~~ INJ
0.5000 mL | INJECTION | Freq: Once | SUBCUTANEOUS | Status: AC
  Administered 2013-03-05: 0.5 mL via SUBCUTANEOUS
  Filled 2013-03-05: qty 0.5

## 2013-03-05 NOTE — Progress Notes (Signed)
9 Days Post-Op  Subjective: She is still having moderate pain and is very nervous about going home  Objective: Vital signs in last 24 hours: Temp:  [98.6 F (37 C)-98.8 F (37.1 C)] 98.8 F (37.1 C) (02/06 0500) Pulse Rate:  [76-91] 76 (02/06 0500) Resp:  [17-18] 18 (02/06 0500) BP: (125-150)/(59-63) 140/63 mmHg (02/06 0500) SpO2:  [93 %-100 %] 98 % (02/06 0500) Last BM Date: 03/04/13  Intake/Output from previous day: 02/05 0701 - 02/06 0700 In: 240 [P.O.:240] Out: 5 [Drains:5] Intake/Output this shift: Total I/O In: 240 [P.O.:240] Out: -   Lungs mostly clear Abdomen is soft, non tender  Lab Results:   Recent Labs  03/03/13 0455 03/04/13 0535  WBC 29.2* 24.4*  HGB 7.4* 10.4*  HCT 22.9* 31.2*  PLT 616* 668*   BMET  Recent Labs  03/04/13 0535  NA 138  K 4.6  CL 104  CO2 17*  GLUCOSE 106*  BUN 13  CREATININE 0.82  CALCIUM 8.9   PT/INR No results found for this basename: LABPROT, INR,  in the last 72 hours ABG No results found for this basename: PHART, PCO2, PO2, HCO3,  in the last 72 hours  Studies/Results: Ct Abdomen Pelvis W Contrast  03/03/2013   CLINICAL DATA:  Postop splenectomy on 02/24/2013, now with leukocytosis.  EXAM: CT ABDOMEN AND PELVIS WITH CONTRAST  TECHNIQUE: Multidetector CT imaging of the abdomen and pelvis was performed using the standard protocol following bolus administration of intravenous contrast.  CONTRAST:  123mL OMNIPAQUE IOHEXOL 300 MG/ML IV. Oral contrast was also administered.  COMPARISON:  DG ABD PORTABLE 1V dated 02/25/2013; CT BIOPSY dated 12/17/2012; CT ABD/PELVIS W CM dated 11/06/2012; CT ABD/PELVIS W CM dated 03/17/2011; DG ABD ACUTE W/CHEST dated 03/17/2011  FINDINGS: The imaging field of view on the the axial images cut off the anterior extent of several of the images, but the anterior abdomen was included on the coronal and sagittal reconstructed images.  Surgical drain in the left upper quadrant the abdomen which courses  through a fluid collection with an enhancing wall immediately posterior to the stomach in the splenectomy bed. The fluid collection measures maximally approximately 6 x 8 x 5 cm. Minimal free fluid in the upper left paracolic gutter and in the anterior left pararenal space. No evidence of ascites elsewhere. No abnormal fluid collections elsewhere.  Edema/inflammation involving the body and tail of the pancreas, with peripancreatic fluid. The entire pancreas enhances normally. Normal liver. Prior cholecystectomy. No biliary ductal dilation. Filling defect within the splenic vein extending into the portal vein, though the portal vein remains patent. Normal adrenal glands and kidneys. 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 Visceral arteries patent.  Stomach decompressed and unremarkable. Normal-appearing small bowel. Large stool burden throughout the colon. 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 Decompressed Hartmann pouch containing high attenuation stool like material as noted on prior examinations. Thinning of the anterior rectus sheath, without evidence of abdominal wall hernia (apart from the parastomal hernia).  Urinary bladder unremarkable. Uterus atrophic. No adnexal masses or free pelvic fluid. Large phleboliths low in the left side of the pelvis, with smaller phleboliths elsewhere in both sides of the pelvis.  Bone window images unremarkable apart from facet degenerative changes at L5-S1. Moderate size left pleural effusion with dense passive atelectasis in the left lower lobe. Minimal atelectasis deep in the visualized right lower lobe. Heart size upper normal.  IMPRESSION: 1. Localized  postoperative fluid collection in the left upper quadrant, posterior to the liver, possibly abscess. The surgical drain passes through this collection. Measurements are given above. 2. Thrombosis of the splenic vein  with extension into the portal vein. The portal vein thrombus is partially occlusive. 3. Possible acute pancreatitis. Please correlate with serum amylase and lipase. 4. Very small amount of ascites. 5. Descending colostomy with parastomal hernia containing a loop of normal-appearing small bowel and fat. 6. Moderate-sized left pleural effusion and associated dense passive atelectasis in the left lower lobe. Minimal atelectasis deep in the right lower lobe. These results will be called to the ordering clinician or representative by the Radiologist Assistant, and communication documented in the PACS Dashboard.   Electronically Signed   By: Evangeline Dakin M.D.   On: 03/03/2013 11:31    Anti-infectives: Anti-infectives   Start     Dose/Rate Route Frequency Ordered Stop   03/01/13 0800  vancomycin (VANCOCIN) IVPB 1000 mg/200 mL premix  Status:  Discontinued     1,000 mg 200 mL/hr over 60 Minutes Intravenous Every 12 hours 03/01/13 0639 03/04/13 1725   03/01/13 0645  imipenem-cilastatin (PRIMAXIN) 500 mg in sodium chloride 0.9 % 100 mL IVPB     500 mg 200 mL/hr over 30 Minutes Intravenous 3 times per day 03/01/13 9794     02/28/13 2315  ciprofloxacin (CIPRO) tablet 500 mg  Status:  Discontinued     500 mg Oral 2 times daily 02/28/13 2304 03/04/13 1725   02/24/13 0600  ciprofloxacin (CIPRO) IVPB 400 mg     400 mg 200 mL/hr over 60 Minutes Intravenous On call to O.R. 02/23/13 1410 02/24/13 0845      Assessment/Plan: s/p Procedure(s): SPLENECTOMY (N/A)  I plan on keeping her through the weekend on IV antibiotics Will try to clarify need for anticoagulation with her hematologist Plan on discharge monday  LOS: 9 days    Kamyra Schroeck A 03/05/2013

## 2013-03-05 NOTE — Progress Notes (Signed)
Physical Therapy Treatment Patient Details Name: [name] MRN: [id number] DOB: 12-19-1950 Today's Date: 03/05/2013 Time: 6387-5643 PT Time Calculation (min): 24 min  PT Assessment / Plan / Recommendation  History of Present Illness pt admitted for evaluation f splenectomy.     PT Comments   Pt limited in ambulation distance today due to pain in abdomen. Pt was agreeable to exercises after ambulating today. Pt continues to present with overall deconditioning and gt abnormalities. Pt is a fall risk due to gt abnormalities. Will have 24/7 (A) upon D/C. Plans to D/C home Monday.   Follow Up Recommendations  Home health PT;Supervision/Assistance - 24 hour     Does the patient have the potential to tolerate intense rehabilitation     Barriers to Discharge        Equipment Recommendations  Hospital bed    Recommendations for Other Services    Frequency Min 3X/week   Progress towards PT Goals Progress towards PT goals: Progressing toward goals  Plan Current plan remains appropriate    Precautions / Restrictions Precautions Precautions: Fall Precaution Comments: previous fall, describes dizziness consistent with vestibular pathology Restrictions Weight Bearing Restrictions: No   Pertinent Vitals/Pain 6/10 in abdomen area. patient repositioned for comfort     Mobility  Bed Mobility Overal bed mobility: Needs Assistance Bed Mobility: Supine to Sit Supine to sit: Min assist General bed mobility comments: pt requires (A) to elevate trunk to sititng position; cues for sequencing; bed mobility very effortful; incr time required  Transfers Overall transfer level: Modified independent Equipment used: Rolling walker (2 wheeled) Transfers: Sit to/from Stand Sit to Stand: Modified independent (Device/Increase time) General transfer comment: incr time for transfers  Ambulation/Gait Ambulation/Gait assistance: Supervision Ambulation Distance (Feet): 200 Feet Assistive device:  Rolling walker (2 wheeled) Gait Pattern/deviations: Step-through pattern;Decreased stride length;Shuffle;Wide base of support Gait velocity: decreased  Gait velocity interpretation: <1.8 ft/sec, indicative of risk for recurrent falls General Gait Details: pt continues to be very cautious and guarded with gt; ambulates with short shuffled steps; ambulated without O2 and sats at 92%; no SOB evident during ambulation today; pt was able to carry on conversation during ambulation; distance limited by pain in abdomen today; cues for sequencing and supervision for safety      Exercises General Exercises - Lower Extremity Ankle Circles/Pumps: AROM;Both;10 reps;Seated Long Arc Quad: AROM;Strengthening;Both;10 reps;Seated Hip ABduction/ADduction: AROM;Both;Strengthening;10 reps;Seated Hip Flexion/Marching: AROM;Strengthening;Both;10 reps   PT Diagnosis:    PT Problem List:   PT Treatment Interventions:     PT Goals (current goals can now be found in the care plan section) Acute Rehab PT Goals Patient Stated Goal: To my daughter's home and then back home by myself PT Goal Formulation: With patient Time For Goal Achievement: 03/11/13 Potential to Achieve Goals: Good  Visit Information  Last PT Received On: 03/05/13 Assistance Needed: +1 History of Present Illness: pt admitted for evaluation f splenectomy.      Subjective Data  Subjective: "im having stomach pains. I can try." Patient Stated Goal: To my daughter's home and then back home by myself   Cognition  Cognition Arousal/Alertness: Awake/alert Behavior During Therapy: WFL for tasks assessed/performed Overall Cognitive Status: Within Functional Limits for tasks assessed    Balance  Balance Overall balance assessment: Needs assistance;History of Falls Sitting-balance support: Feet supported;No upper extremity supported Sitting balance-Leahy Scale: Good Standing balance support: During functional activity;Bilateral upper extremity  supported Standing balance-Leahy Scale: Fair  End of Session PT - End of Session Equipment Utilized During  Treatment: Gait belt;Oxygen;Other (comment) (pt on o3L O2; not walking with O2) Activity Tolerance: Patient tolerated treatment well Patient left: in chair;with call bell/phone within reach Nurse Communication: Mobility status   GP     Gustavus Bryant, [place] 03/05/2013, 1:06 PM

## 2013-03-06 LAB — GLUCOSE, CAPILLARY
GLUCOSE-CAPILLARY: 71 mg/dL (ref 70–99)
GLUCOSE-CAPILLARY: 156 mg/dL — AB (ref 70–99)
Glucose-Capillary: 105 mg/dL — ABNORMAL HIGH (ref 70–99)
Glucose-Capillary: 177 mg/dL — ABNORMAL HIGH (ref 70–99)

## 2013-03-06 LAB — CBC
HCT: 33.8 % — ABNORMAL LOW (ref 36.0–46.0)
HEMOGLOBIN: 11.1 g/dL — AB (ref 12.0–15.0)
MCH: 30.2 pg (ref 26.0–34.0)
MCHC: 32.8 g/dL (ref 30.0–36.0)
MCV: 92.1 fL (ref 78.0–100.0)
Platelets: 842 10*3/uL — ABNORMAL HIGH (ref 150–400)
RBC: 3.67 MIL/uL — AB (ref 3.87–5.11)
RDW: 14.9 % (ref 11.5–15.5)
WBC: 26.2 10*3/uL — ABNORMAL HIGH (ref 4.0–10.5)

## 2013-03-06 NOTE — Progress Notes (Signed)
Patient ID: [name], female   DOB: 05-Apr-1950, 63 y.o.   MRN: [id number] 10 Days Post-Op  Subjective: Pt c/o abdominal pain, but is trying to wean herself from IV pain meds since she is going home on monday  Objective: Vital signs in last 24 hours: Temp:  [97.5 F (36.4 C)-98.9 F (37.2 C)] 97.5 F (36.4 C) (02/07 0635) Pulse Rate:  [74-82] 76 (02/07 0635) Resp:  [18] 18 (02/07 0635) BP: (142-158)/(48-52) 158/52 mmHg (02/07 0635) SpO2:  [93 %-100 %] 98 % (02/07 1043) Last BM Date: 03/06/13  Intake/Output from previous day: 02/06 0701 - 02/07 0700 In: 1200 [P.O.:1200] Out: 15 [Drains:15] Intake/Output this shift:    PE: Abd: soft, appropriately tender, ostomy with good output, incision c/d/i with steri-strips, +BS, JP drain with dishwater type output  Lab Results:   Recent Labs  03/04/13 0535 03/06/13 0555  WBC 24.4* 26.2*  HGB 10.4* 11.1*  HCT 31.2* 33.8*  PLT 668* 842*   BMET  Recent Labs  03/04/13 0535  NA 138  K 4.6  CL 104  CO2 17*  GLUCOSE 106*  BUN 13  CREATININE 0.82  CALCIUM 8.9   PT/INR No results found for this basename: LABPROT, INR,  in the last 72 hours CMP     Component Value Date/Time   NA 138 03/04/2013 0535   NA 138 10/08/2012 0757   K 4.6 03/04/2013 0535   K 5.0 10/08/2012 0757   CL 104 03/04/2013 0535   CL 103 03/23/2012 0938   CO2 17* 03/04/2013 0535   CO2 23 10/08/2012 0757   GLUCOSE 106* 03/04/2013 0535   GLUCOSE 302* 10/08/2012 0757   GLUCOSE 319* 03/23/2012 0938   BUN 13 03/04/2013 0535   BUN 21.9 10/08/2012 0757   CREATININE 0.82 03/04/2013 0535   CREATININE 1.3* 10/08/2012 0757   CALCIUM 8.9 03/04/2013 0535   CALCIUM 9.9 10/08/2012 0757   PROT 6.1 03/01/2013 0735   PROT 7.2 10/08/2012 0757   ALBUMIN 2.4* 03/01/2013 0735   ALBUMIN 3.6 10/08/2012 0757   AST 47* 03/01/2013 0735   AST 41* 10/08/2012 0757   ALT 157* 03/01/2013 0735   ALT 49 10/08/2012 0757   ALKPHOS 119* 03/01/2013 0735   ALKPHOS 65 10/08/2012 0757   BILITOT 1.0 03/01/2013 0735   BILITOT 0.31 10/08/2012 0757   GFRNONAA 76* 03/04/2013 0535   GFRAA 88* 03/04/2013 0535   Lipase     Component Value Date/Time   LIPASE 54 03/04/2013 0535       Studies/Results: No results found.  Anti-infectives: Anti-infectives   Start     Dose/Rate Route Frequency Ordered Stop   03/01/13 0800  vancomycin (VANCOCIN) IVPB 1000 mg/200 mL premix  Status:  Discontinued     1,000 mg 200 mL/hr over 60 Minutes Intravenous Every 12 hours 03/01/13 0639 03/04/13 1725   03/01/13 0645  imipenem-cilastatin (PRIMAXIN) 500 mg in sodium chloride 0.9 % 100 mL IVPB     500 mg 200 mL/hr over 30 Minutes Intravenous 3 times per day 03/01/13 0639     02/28/13 2315  ciprofloxacin (CIPRO) tablet 500 mg  Status:  Discontinued     500 mg Oral 2 times daily 02/28/13 2304 03/04/13 1725   02/24/13 0600  ciprofloxacin (CIPRO) IVPB 400 mg     400 mg 200 mL/hr over 60 Minutes Intravenous On call to O.R. 02/23/13 1410 02/24/13 0845       Assessment/Plan  1. POD 10, s/p open splenectomy 2. PV thrombosis 3.  Cirrhosis  Plan: 1. Cont IV abx through the weekend 2. Anticipate dc home on Monday per DB 3. Cont to try and wean IV pain meds and just take orals.   LOS: 10 days    Kadian Barcellos E 03/06/2013, 11:40 AM Pager: [phone]

## 2013-03-07 LAB — GLUCOSE, CAPILLARY
GLUCOSE-CAPILLARY: 60 mg/dL — AB (ref 70–99)
GLUCOSE-CAPILLARY: 77 mg/dL (ref 70–99)
Glucose-Capillary: 165 mg/dL — ABNORMAL HIGH (ref 70–99)
Glucose-Capillary: 91 mg/dL (ref 70–99)
Glucose-Capillary: 163 mg/dL — ABNORMAL HIGH (ref 70–99)

## 2013-03-07 MED ORDER — IPRATROPIUM-ALBUTEROL 0.5-2.5 (3) MG/3ML IN SOLN: 3.0000 mL | RESPIRATORY_TRACT | Status: DC | PRN

## 2013-03-07 NOTE — Progress Notes (Signed)
ANTIBIOTIC CONSULT NOTE   Pharmacy Consult for Primaxin Indication: rule out pneumonia   Patient Measurements: Height: 5' (152.4 cm) Weight: 189 lb 2.5 oz (85.8 kg) IBW/kg (Calculated) : 45.5  Vital Signs: Temp: 98.4 F (36.9 C) (02/08 0502) Temp src: Oral (02/08 0502) BP: 148/55 mmHg (02/08 0502) Pulse Rate: 74 (02/08 0502) Intake/Output from previous day: 02/07 0701 - 02/08 0700 In: 353 [P.O.:120; I.V.:133; IV Piggyback:100] Out: -  Intake/Output from this shift: Total I/O In: 360 [P.O.:360] Out: -   Labs:  Recent Labs  03/06/13 0555  WBC 26.2*  HGB 11.1*  PLT 842*   Estimated Creatinine Clearance: 70.1 ml/min (by C-G formula based on Cr of 0.82).    Assessment: 63yo female admitted 1/27 for splenectomy, developed fever w/ nonproductive cough, CXR concerning for PNA.  Has been on imipenem since 2/2.  MD plans to discharge her 2/9 on po abx. Renal function has been normal. No dose adjustments warranted. Pharmacy to sign off for now.  Plan: Continue primaxin through today at 500mg  q8 Transition to po abx at discharge likely tomorrow  Erin Hearing PharmD., BCPS Clinical Pharmacist Pager [phone] 03/07/2013 10:01 AM

## 2013-03-07 NOTE — Progress Notes (Signed)
11 Days Post-Op  Subjective: Patient still with some abdominal pain, but not using much pain medicine Wants to go home tomorrow. + BM  Objective: Vital signs in last 24 hours: Temp:  [98.4 F (36.9 C)-98.8 F (37.1 C)] 98.4 F (36.9 C) (02/08 0502) Pulse Rate:  [74-84] 74 (02/08 0502) Resp:  [17-20] 20 (02/08 0502) BP: (138-148)/(50-84) 148/55 mmHg (02/08 0502) SpO2:  [98 %-100 %] 99 % (02/08 0822) Last BM Date: 03/06/13  Intake/Output from previous day: 02/07 0701 - 02/08 0700 In: 353 [P.O.:120; I.V.:133; IV Piggyback:100] Out: -  Intake/Output this shift: Total I/O In: 360 [P.O.:360] Out: -   General appearance: alert, cooperative and no distress GI: incisional tenderness Lateral part of incision slightly macerated where it is sitting under her left breast - keep dry gauze over this area Drain - minimal greyish output  Lab Results:   Recent Labs  03/06/13 0555  WBC 26.2*  HGB 11.1*  HCT 33.8*  PLT 842*   BMET No results found for this basename: NA, K, CL, CO2, GLUCOSE, BUN, CREATININE, CALCIUM,  in the last 72 hours PT/INR No results found for this basename: LABPROT, INR,  in the last 72 hours ABG No results found for this basename: PHART, PCO2, PO2, HCO3,  in the last 72 hours  Studies/Results: No results found.  Anti-infectives: Anti-infectives   Start     Dose/Rate Route Frequency Ordered Stop   03/01/13 0800  vancomycin (VANCOCIN) IVPB 1000 mg/200 mL premix  Status:  Discontinued     1,000 mg 200 mL/hr over 60 Minutes Intravenous Every 12 hours 03/01/13 0639 03/04/13 1725   03/01/13 0645  imipenem-cilastatin (PRIMAXIN) 500 mg in sodium chloride 0.9 % 100 mL IVPB     500 mg 200 mL/hr over 30 Minutes Intravenous 3 times per day 03/01/13 7616     02/28/13 2315  ciprofloxacin (CIPRO) tablet 500 mg  Status:  Discontinued     500 mg Oral 2 times daily 02/28/13 2304 03/04/13 1725   02/24/13 0600  ciprofloxacin (CIPRO) IVPB 400 mg     400 mg 200 mL/hr  over 60 Minutes Intravenous On call to O.R. 02/23/13 1410 02/24/13 0845      Assessment/Plan: s/p Procedure(s): SPLENECTOMY (N/A) Plan for discharge tomorrow Leave drain in place  LOS: 11 days    Willmar Stockinger K. 03/07/2013

## 2013-03-08 LAB — COMPREHENSIVE METABOLIC PANEL
ALK PHOS: 180 U/L — AB (ref 39–117)
ALT: 29 U/L (ref 0–35)
AST: 34 U/L (ref 0–37)
Albumin: 2.5 g/dL — ABNORMAL LOW (ref 3.5–5.2)
BUN: 8 mg/dL (ref 6–23)
CO2: 26 meq/L (ref 19–32)
Calcium: 9.4 mg/dL (ref 8.4–10.5)
Chloride: 101 mEq/L (ref 96–112)
Creatinine, Ser: 0.78 mg/dL (ref 0.50–1.10)
GFR, EST NON AFRICAN AMERICAN: 88 mL/min — AB (ref >90)
GLUCOSE: 88 mg/dL (ref 70–99)
POTASSIUM: 4.5 meq/L (ref 3.7–5.3)
SODIUM: 140 meq/L (ref 137–147)
Total Bilirubin: 0.5 mg/dL (ref 0.3–1.2)
Total Protein: 6.8 g/dL (ref 6.0–8.3)

## 2013-03-08 LAB — CBC
HCT: 35.3 % — ABNORMAL LOW (ref 36.0–46.0)
HEMOGLOBIN: 11.4 g/dL — AB (ref 12.0–15.0)
MCH: 30.2 pg (ref 26.0–34.0)
MCHC: 32.3 g/dL (ref 30.0–36.0)
MCV: 93.6 fL (ref 78.0–100.0)
Platelets: 859 10*3/uL — ABNORMAL HIGH (ref 150–400)
RBC: 3.77 MIL/uL — ABNORMAL LOW (ref 3.87–5.11)
RDW: 14.9 % (ref 11.5–15.5)
WBC: 18.3 10*3/uL — ABNORMAL HIGH (ref 4.0–10.5)

## 2013-03-08 LAB — GLUCOSE, CAPILLARY: Glucose-Capillary: 70 mg/dL (ref 70–99)

## 2013-03-08 LAB — LIPASE, BLOOD: Lipase: 36 U/L (ref 11–59)

## 2013-03-08 MED ORDER — AMOXICILLIN-POT CLAVULANATE 875-125 MG PO TABS: 1.0000 | ORAL_TABLET | Freq: Two times a day (BID) | ORAL | Status: AC

## 2013-03-08 MED ORDER — ASPIRIN 325 MG PO TABS: 325.0000 mg | ORAL_TABLET | Freq: Every day | ORAL | Status: DC

## 2013-03-08 MED ORDER — HYDROMORPHONE HCL 4 MG PO TABS: 2.0000 mg | ORAL_TABLET | Freq: Four times a day (QID) | ORAL | Status: DC | PRN

## 2013-03-08 NOTE — Discharge Instructions (Signed)
May shower  [facility], [place] [phone]  OPEN ABDOMINAL SURGERY: POST OP INSTRUCTIONS  Always review your discharge instruction sheet given to you by the facility where your surgery was performed.  IF YOU HAVE DISABILITY OR FAMILY LEAVE FORMS, YOU MUST BRING THEM TO THE OFFICE FOR PROCESSING.  PLEASE DO NOT GIVE THEM TO YOUR DOCTOR.  1. A prescription for pain medication may be given to you upon discharge.  Take your pain medication as prescribed, if needed.  If narcotic pain medicine is not needed, then you may take acetaminophen (Tylenol) or ibuprofen (Advil) as needed. 2. Take your usually prescribed medications unless otherwise directed. 3. If you need a refill on your pain medication, please contact your pharmacy. They will contact our office to request authorization.  Prescriptions will not be filled after 5pm or on week-ends. 4. You should follow a light diet the first few days after arrival home, such as soup and crackers, pudding, etc.unless your doctor has advised otherwise. A high-fiber, low fat diet can be resumed as tolerated.   Be sure to include lots of fluids daily. Most patients will experience some swelling and bruising on the chest and neck area.  Ice packs will help.  Swelling and bruising can take several days to resolve 5. Most patients will experience some swelling and bruising in the area of the incision. Ice pack will help. Swelling and bruising can take several days to resolve..  6. It is common to experience some constipation if taking pain medication after surgery.  Increasing fluid intake and taking a stool softener will usually help or prevent this problem from occurring.  A mild laxative (Milk of Magnesia or Miralax) should be taken according to package directions if there are no bowel movements after 48 hours. 7.  You may have steri-strips (small skin tapes) in place directly over the incision.  These strips should be left on the skin for 7-10  days.  If your surgeon used skin glue on the incision, you may shower in 24 hours.  The glue will flake off over the next 2-3 weeks.  Any sutures or staples will be removed at the office during your follow-up visit. You may find that a light gauze bandage over your incision may keep your staples from being rubbed or pulled. You may shower and replace the bandage daily. 8. ACTIVITIES:  You may resume regular (light) daily activities beginning the next day--such as daily self-care, walking, climbing stairs--gradually increasing activities as tolerated.  You may have sexual intercourse when it is comfortable.  Refrain from any heavy lifting or straining until approved by your doctor. a. You may drive when you no longer are taking prescription pain medication, you can comfortably wear a seatbelt, and you can safely maneuver your car and apply brakes b. Return to Work: ___________________________________ 19. You should see your doctor in the office for a follow-up appointment approximately two weeks after your surgery.  Make sure that you call for this appointment within a day or two after you arrive home to insure a convenient appointment time. OTHER INSTRUCTIONS:  _____________________________________________________________ _____________________________________________________________  WHEN TO CALL YOUR DOCTOR: 1. Fever over 101.0 2. Inability to urinate 3. Nausea and/or vomiting 4. Extreme swelling or bruising 5. Continued bleeding from incision. 6. Increased pain, redness, or drainage from the incision. 7. Difficulty swallowing or breathing 8. Muscle cramping or spasms. 9. Numbness or tingling in hands or feet or around lips.  The clinic staff  is available to answer your questions during regular business hours.  Please dont hesitate to call and ask to speak to one of the nurses if you have concerns.  For further questions, please visit [email or web address]

## 2013-03-08 NOTE — Discharge Summary (Signed)
Physician Discharge Summary  Patient ID: [name] MRN: [id number] DOB/AGE: Jun 04, 1950 63 y.o.  Admit date: 02/24/2013 Discharge date: 03/08/2013  Admission Diagnoses:  Discharge Diagnoses:  Active Problems:   Splenomegaly   Pneumonia, organism unspecified cirrhosis Portal vein thrombosis Type 2 diabetes Acute blood loss anemia (intraop)  Discharged Condition: good  Hospital Course: This is a pleasant female with a preoperative history of cirrhosis and diabetes as well as chronic pulmonary issues who was admitted for elective splenectomy. She had a large spleen and thrombocytopenia. She underwent splenectomy which was complicated by her cirrhosis resulting in blood loss anemia.  She was able to be extubated and was sent to the intensive care. She was then quickly transitioned to the floor. She required transfusions both in the operating room and postoperatively. Her platelet count did respond well to splenectomy and was 800,000 at the time of discharge. During her hospitalization, her white blood count remained elevated. A chest x-ray suggested pneumonia versus atelectasis and effusion. She was started on IV antibiotics and pulmonary critical care was counseled. She responded quickly to pulmonary toilet. As per their last note, debatably that she had pneumonia but instead just atelectasis. She slowly improved throughout the hospitalization. She had chronic pain preoperatively and this was able to be controlled. A drain remained in place in the fluid collection in the left upper outer quadrant. At the time of discharge, she was doing much better. She was ambulating and tolerating a diet. Her colostomy from a previous surgery was working well. Incidentally, a postoperative CAT scan did show partial thrombosis of the portal vein. I discussed this with her hematologist. Because she is high risk for anticoagulation given her cirrhosis, the decision was made to place her on aspirin only. She  remained asymptomatic at the time of discharge.  Consults: pulmonary/intensive care  Significant Diagnostic Studies:   Treatments: surgery: splenectomy  Discharge Exam: Blood pressure 143/44, pulse 73, temperature 98.2 F (36.8 C), temperature source Oral, resp. rate 19, height 5' (1.524 m), weight 189 lb 2.5 oz (85.8 kg), SpO2 99.00%. General appearance: alert, cooperative and no distress Resp: clear to auscultation bilaterally Cardio: regular rate and rhythm, S1, S2 normal, no murmur, click, rub or gallop Incision/Wound: abdomen soft, incision stable, drain serous  Disposition: 01-Home or Self Care   Future Appointments Provider Department Dept Phone   03/16/2013 10:15 AM Philemon Kingdom, MD [facility] [phone]   03/30/2013 10:15 AM [facility] [phone]   03/30/2013 10:45 AM Curt Bears, MD [facility] [phone]       Medication List         albuterol-ipratropium 18-103 MCG/ACT inhaler  Commonly known as:  COMBIVENT  Inhale 2 puffs into the lungs every 4 (four) hours as needed for wheezing or shortness of breath.     amoxicillin-clavulanate 875-125 MG per tablet  Commonly known as:  AUGMENTIN  Take 1 tablet by mouth 2 (two) times daily.     aspirin 325 MG tablet  Take 1 tablet (325 mg total) by mouth daily.     cholecalciferol 1000 UNITS tablet  Commonly known as:  VITAMIN D  Take 1,000 Units by mouth daily.     diazepam 5 MG tablet  Commonly known as:  VALIUM  Take 5 mg by mouth 3 (three) times daily as needed for anxiety.     dicyclomine 10 MG capsule  Commonly known as:  BENTYL  Take 1 capsule (  10 mg total) by mouth 3 (three) times daily.     esomeprazole 40 MG capsule  Commonly known as:  NEXIUM  Take 40 mg by mouth every evening.     Fluticasone-Salmeterol 250-50 MCG/DOSE Aepb  Commonly known as:  ADVAIR  Inhale 1 puff into the lungs every  12 (twelve) hours.     folic acid 1 MG tablet  Commonly known as:  FOLVITE  Take 1 mg by mouth every evening.     furosemide 20 MG tablet  Commonly known as:  LASIX  Take 20-40 mg by mouth daily as needed for fluid.     HYDROmorphone 4 MG tablet  Commonly known as:  DILAUDID  Take 0.5-2 tablets (2-8 mg total) by mouth every 6 (six) hours as needed.     insulin glargine 100 UNIT/ML injection  Commonly known as:  LANTUS  Inject 0.6 mLs (60 Units total) into the skin 2 (two) times daily.     insulin lispro 100 UNIT/ML injection  Commonly known as:  HUMALOG  Inject 35-47 Units into the skin 3 (three) times daily before meals. Injects 47 units daily with breakfast, 35 units daily with lunch, and 45 units daily with supper.     Insulin Pen Needle 32G X 4 MM Misc  Commonly known as:  EASY TOUCH PEN NEEDLES  Use as directed, diagnosis code is 250.00     Insulin Pen Needle 32G X 4 MM Misc  Commonly known as:  BD PEN NEEDLE NANO U/F  Use 4 times daily as directed.     metFORMIN 1000 MG tablet  Commonly known as:  GLUCOPHAGE  Take 1,000 mg by mouth 2 (two) times daily with a meal.     metoCLOPramide 10 MG tablet  Commonly known as:  REGLAN  Take 10 mg by mouth 3 (three) times daily.     Oxycodone HCl 20 MG Tabs  Take 1 tablet (20 mg total) by mouth every 3 (three) hours as needed (pain).     potassium chloride 10 MEQ CR capsule  Commonly known as:  MICRO-K  Take 1 capsule (10 mEq total) by mouth daily.     promethazine 25 MG tablet  Commonly known as:  PHENERGAN  Take 25 mg by mouth every 6 (six) hours as needed for nausea. For nausea     rifaximin 550 MG Tabs tablet  Commonly known as:  XIFAXAN  Take 550 mg by mouth 2 (two) times daily.     traZODone 100 MG tablet  Commonly known as:  DESYREL  Take 2 tablets (200 mg total) by mouth at bedtime.           Follow-up Information   Follow up with [facility] A, MD. Call on 03/15/2013. (drain removal ---ask for  [facility])    Specialty:  General Surgery   Contact information:   [address] [place]  [address] [phone]       Signed: Harl Bowie 03/08/2013, 6:40 AM

## 2013-03-08 NOTE — Progress Notes (Signed)
Discharge home. Home discharge instruction given to patient and daughter, no question verbalized.

## 2013-03-08 NOTE — Progress Notes (Signed)
12 Days Post-Op  Subjective: Doing much better Minimal pain Really wants to go home  Objective: Vital signs in last 24 hours: Temp:  [98.2 F (36.8 C)-98.7 F (37.1 C)] 98.2 F (36.8 C) (02/09 0552) Pulse Rate:  [69-78] 73 (02/09 0552) Resp:  [18-19] 19 (02/09 0552) BP: (143-153)/(44-62) 143/44 mmHg (02/09 0552) SpO2:  [98 %-100 %] 99 % (02/09 0552) Last BM Date: 03/07/13  Intake/Output from previous day: 02/08 0701 - 02/09 0700 In: 2280 [P.O.:1440; I.V.:540; IV Piggyback:300] Out: -  Intake/Output this shift: Total I/O In: 360 [P.O.:360] Out: -   Lungs clear Abdomen soft, incision stable, drain with minimal output  Lab Results:   Recent Labs  03/06/13 0555 03/08/13 0110  WBC 26.2* 18.3*  HGB 11.1* 11.4*  HCT 33.8* 35.3*  PLT 842* 859*   BMET  Recent Labs  03/08/13 0110  NA 140  K 4.5  CL 101  CO2 26  GLUCOSE 88  BUN 8  CREATININE 0.78  CALCIUM 9.4   PT/INR No results found for this basename: LABPROT, INR,  in the last 72 hours ABG No results found for this basename: PHART, PCO2, PO2, HCO3,  in the last 72 hours  Studies/Results: No results found.  Anti-infectives: Anti-infectives   Start     Dose/Rate Route Frequency Ordered Stop   03/01/13 0800  vancomycin (VANCOCIN) IVPB 1000 mg/200 mL premix  Status:  Discontinued     1,000 mg 200 mL/hr over 60 Minutes Intravenous Every 12 hours 03/01/13 0639 03/04/13 1725   03/01/13 0645  imipenem-cilastatin (PRIMAXIN) 500 mg in sodium chloride 0.9 % 100 mL IVPB     500 mg 200 mL/hr over 30 Minutes Intravenous 3 times per day 03/01/13 0639     02/28/13 2315  ciprofloxacin (CIPRO) tablet 500 mg  Status:  Discontinued     500 mg Oral 2 times daily 02/28/13 2304 03/04/13 1725   02/24/13 0600  ciprofloxacin (CIPRO) IVPB 400 mg     400 mg 200 mL/hr over 60 Minutes Intravenous On call to O.R. 02/23/13 1410 02/24/13 0845      Assessment/Plan: s/p Procedure(s): SPLENECTOMY (N/A)  Discharge home with  drain in place  LOS: 12 days    [name] A 03/08/2013

## 2013-03-15 ENCOUNTER — Encounter (INDEPENDENT_AMBULATORY_CARE_PROVIDER_SITE_OTHER): Payer: Self-pay | Admitting: Surgery

## 2013-03-15 ENCOUNTER — Ambulatory Visit (INDEPENDENT_AMBULATORY_CARE_PROVIDER_SITE_OTHER): Payer: Medicare Other | Admitting: Surgery

## 2013-03-15 DIAGNOSIS — Z09 Encounter for follow-up examination after completed treatment for conditions other than malignant neoplasm: Secondary | ICD-10-CM

## 2013-03-15 NOTE — Progress Notes (Signed)
Subjective:     Patient ID: [name], female   DOB: December 08, 1950, 63 y.o.   MRN: [id number]  HPI She is here for postop visit status post splenectomy. Again she had a consultative postoperative course secondary to intraoperative bleeding. There was also question of a postoperative pneumonia. She returns today with a drain still in place. She reports she is doing well and has had no fevers or chills and is eating well. She reports minimal output from the drain  Review of Systems     Objective:   Physical Exam On exam, she looks great. Her abdomen is soft and nontender. Her drain has minimal purulent fluid.  I removed the drain    Assessment:     Patient stable postop     Plan:     I will see her back in 2 weeks. If she develops a fever, I will have to get a CAT scan of abdomen and pelvis to see if there is a fluid collection which needs interventional drainage. She was to refrain from any heavy lifting and will continue on aspirin for the portal vein partial thrombosis. She'll be seeing her hematologist in a week

## 2013-03-16 ENCOUNTER — Ambulatory Visit: Payer: Medicare Other | Admitting: Internal Medicine

## 2013-03-24 ENCOUNTER — Other Ambulatory Visit: Payer: Self-pay | Admitting: Family Medicine

## 2013-03-26 MED ORDER — DICYCLOMINE HCL 10 MG PO CAPS: ORAL_CAPSULE | ORAL | Status: DC

## 2013-03-26 NOTE — Addendum Note (Signed)
Addended by: Westley Hummer B on: 03/26/2013 09:57 AM   Modules accepted: Orders

## 2013-03-30 ENCOUNTER — Encounter: Payer: Self-pay | Admitting: Internal Medicine

## 2013-03-30 ENCOUNTER — Other Ambulatory Visit (HOSPITAL_BASED_OUTPATIENT_CLINIC_OR_DEPARTMENT_OTHER): Payer: Medicare Other

## 2013-03-30 ENCOUNTER — Telehealth: Payer: Self-pay | Admitting: Internal Medicine

## 2013-03-30 ENCOUNTER — Ambulatory Visit (HOSPITAL_BASED_OUTPATIENT_CLINIC_OR_DEPARTMENT_OTHER): Payer: Medicare Other

## 2013-03-30 ENCOUNTER — Encounter (INDEPENDENT_AMBULATORY_CARE_PROVIDER_SITE_OTHER): Payer: Self-pay | Admitting: Surgery

## 2013-03-30 ENCOUNTER — Ambulatory Visit (INDEPENDENT_AMBULATORY_CARE_PROVIDER_SITE_OTHER): Payer: Medicare Other | Admitting: Surgery

## 2013-03-30 ENCOUNTER — Ambulatory Visit (HOSPITAL_BASED_OUTPATIENT_CLINIC_OR_DEPARTMENT_OTHER): Payer: Medicare Other | Admitting: Internal Medicine

## 2013-03-30 VITALS — BP 170/68 | HR 54 | Temp 98.4°F | Resp 16 | Ht 60.0 in | Wt 180.2 lb

## 2013-03-30 DIAGNOSIS — Z862 Personal history of diseases of the blood and blood-forming organs and certain disorders involving the immune mechanism: Secondary | ICD-10-CM

## 2013-03-30 DIAGNOSIS — Z85118 Personal history of other malignant neoplasm of bronchus and lung: Secondary | ICD-10-CM

## 2013-03-30 DIAGNOSIS — D539 Nutritional anemia, unspecified: Secondary | ICD-10-CM

## 2013-03-30 DIAGNOSIS — D696 Thrombocytopenia, unspecified: Secondary | ICD-10-CM

## 2013-03-30 DIAGNOSIS — Z09 Encounter for follow-up examination after completed treatment for conditions other than malignant neoplasm: Secondary | ICD-10-CM

## 2013-03-30 LAB — CBC WITH DIFFERENTIAL/PLATELET
BASO%: 1.6 % (ref 0.0–2.0)
Basophils Absolute: 0.3 10*3/uL — ABNORMAL HIGH (ref 0.0–0.1)
EOS ABS: 0.6 10*3/uL — AB (ref 0.0–0.5)
EOS%: 3.7 % (ref 0.0–7.0)
HEMATOCRIT: 41 % (ref 34.8–46.6)
HEMOGLOBIN: 13.1 g/dL (ref 11.6–15.9)
LYMPH%: 33.2 % (ref 14.0–49.7)
MCH: 28.5 pg (ref 25.1–34.0)
MCHC: 32 g/dL (ref 31.5–36.0)
MCV: 89.1 fL (ref 79.5–101.0)
MONO#: 2.3 10*3/uL — AB (ref 0.1–0.9)
MONO%: 15 % — ABNORMAL HIGH (ref 0.0–14.0)
NEUT%: 46.5 % (ref 38.4–76.8)
NEUTROS ABS: 7.1 10*3/uL — AB (ref 1.5–6.5)
PLATELETS: 481 10*3/uL — AB (ref 145–400)
RBC: 4.6 10*6/uL (ref 3.70–5.45)
RDW: 14.9 % — ABNORMAL HIGH (ref 11.2–14.5)
WBC: 15.3 10*3/uL — AB (ref 3.9–10.3)
lymph#: 5.1 10*3/uL — ABNORMAL HIGH (ref 0.9–3.3)
nRBC: 0 % (ref 0–0)

## 2013-03-30 LAB — COMPREHENSIVE METABOLIC PANEL (CC13)
ALBUMIN: 3.6 g/dL (ref 3.5–5.0)
ALT: 29 U/L (ref 0–55)
ANION GAP: 11 meq/L (ref 3–11)
AST: 43 U/L — ABNORMAL HIGH (ref 5–34)
Alkaline Phosphatase: 130 U/L (ref 40–150)
BUN: 16 mg/dL (ref 7.0–26.0)
CHLORIDE: 105 meq/L (ref 98–109)
CO2: 22 meq/L (ref 22–29)
CREATININE: 1 mg/dL (ref 0.6–1.1)
Glucose: 136 mg/dl (ref 70–140)
POTASSIUM: 4.6 meq/L (ref 3.5–5.1)
Sodium: 138 mEq/L (ref 136–145)
Total Bilirubin: 0.42 mg/dL (ref 0.20–1.20)
Total Protein: 8 g/dL (ref 6.4–8.3)

## 2013-03-30 LAB — LACTATE DEHYDROGENASE (CC13): LDH: 174 U/L (ref 125–245)

## 2013-03-30 LAB — TECHNOLOGIST REVIEW

## 2013-03-30 MED ORDER — SODIUM CHLORIDE 0.9 % IV SOLN
Freq: Once | INTRAVENOUS | Status: AC
  Administered 2013-03-30: 13:00:00 via INTRAVENOUS

## 2013-03-30 MED ORDER — ZOLEDRONIC ACID 4 MG/100ML IV SOLN
4.0000 mg | Freq: Once | INTRAVENOUS | Status: AC
  Administered 2013-03-30: 4 mg via INTRAVENOUS
  Filled 2013-03-30: qty 100

## 2013-03-30 MED ORDER — HYDROMORPHONE HCL 4 MG PO TABS: 2.0000 mg | ORAL_TABLET | Freq: Four times a day (QID) | ORAL | Status: DC | PRN

## 2013-03-30 NOTE — Patient Instructions (Signed)

## 2013-03-30 NOTE — Progress Notes (Signed)
Pt reports left thumb pain with numbness and swelling in her feet. I told her to call PCP about these issues.

## 2013-03-30 NOTE — Telephone Encounter (Signed)
gv adn printed appt sched and avs for pt for March thru May....sed addded tx.

## 2013-03-30 NOTE — Progress Notes (Signed)
Subjective:     Patient ID: [name], female   DOB: August 02, 1950, 63 y.o.   MRN: [id number]  HPI She is here for another postoperative visit. Overall she is doing well denies any fevers. She still has discomfort at her incision and has numbness and tingling with pain in The thumb of her left hand.  She is eating well and her ostomy is working well  Review of Systems     Objective:   Physical Exam On exam, her left upper quadrant subcostal incision is healing well. She does have some swelling of her thumb which is very mild. She can bend all the joints of the thumb    Assessment:     Patient stable postop     Plan:     I believe she is to see her primary care physician regarding the conversation his referral to either a neurologist or a hand specialist. From my standpoint she will resume her normal activities and I will see her back as needed

## 2013-03-30 NOTE — Progress Notes (Signed)
[facility] [phone]   [phone]  OFFICE PROGRESS NOTE  [name], MD [place] [address]  DIAGNOSIS:  1) stage IB non-small cell lung cancer diagnosed in April 2004 status post left upper lobectomy by Dr. Roxan Hockey.  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  3) anemia of chronic disease plus/minus deficiency secondary to GI bleed. \ 4) hypercalcemia most likely secondary to excessive vitamin D intake.  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 Ninfa Linden.  CURRENT THERAPY:  Zometa 4 mg IV X one today  INTERVAL HISTORY: [name] 63 y.o. female returns to the clinic today for followup visit accompanied by her daughter. She continues to complain of persistent fatigue and bleeding from the colostomy bag.  She recently underwent splenectomy under the care of Dr. Ninfa Linden. She has significant improvement in her anemia and thrombocytopenia after the surgery. She denied having any other significant complaints and specifically no nausea or vomiting, no fever or chills. She denied having any significant weight loss or night sweats. She has no chest pain but continues to have shortness of breath with exertion. She has no other bleeding, bruises or ecchymosis. She had repeat CBC and comprehensive metabolic panel performed earlier today and she is here for evaluation and discussion of her lab results.   MEDICAL HISTORY: Past Medical History  Diagnosis Date  . Cirrhosis   . GERD (gastroesophageal reflux disease)   . Cervical disc syndrome   . Lung cancer     squamous cell  . Chronic respiratory failure   . Diverticulitis of colon   . Perforation of colon   . Arthritis   . IBS (irritable bowel syndrome)   . Chronic lower GI bleeding   . Overactive bladder   . Cervical cancer   . Thrombocytopenia     sees Dr. Julien Nordmann   . Splenomegaly   . Anxiety   .  Depression   . Hypertension   . Asthma   . Chronic headache disorder   . Blood transfusion without reported diagnosis   . Heart murmur   . Hyperlipidemia   . Lung cancer   . COPD (chronic obstructive pulmonary disease)     sees Dr. Gwenette Greet   . Anemia     sees Dr. Julien Nordmann, due to chronic disease and GI losses   . Diabetes mellitus     sees Dr. Cruzita Lederer   . Colostomy care     ALLERGIES:  is allergic to penicillins; codeine phosphate; hydrocodone-acetaminophen; morphine; other; and cephalexin.  MEDICATIONS:  Current Outpatient Prescriptions  Medication Sig Dispense Refill  . albuterol-ipratropium (COMBIVENT) 18-103 MCG/ACT inhaler Inhale 2 puffs into the lungs every 4 (four) hours as needed for wheezing or shortness of breath.       [name] aspirin 325 MG tablet Take 1 tablet (325 mg total) by mouth daily.  30 tablet  0  . cholecalciferol (VITAMIN D) 1000 UNITS tablet Take 1,000 Units by mouth daily.       . diazepam (VALIUM) 5 MG tablet Take 5 mg by mouth 3 (three) times daily as needed for anxiety.      . dicyclomine (BENTYL) 10 MG capsule TAKE ONE CAPSULE BY MOUTH THREE TIMES DAILY  90 capsule  0  . esomeprazole (NEXIUM) 40 MG capsule Take 40 mg by mouth every evening.      . Fluticasone-Salmeterol (ADVAIR) 250-50 MCG/DOSE AEPB Inhale 1 puff into the  lungs every 12 (twelve) hours.  60 each  11  . folic acid (FOLVITE) 1 MG tablet Take 1 mg by mouth every evening.       . furosemide (LASIX) 20 MG tablet Take 20-40 mg by mouth daily as needed for fluid.      [name] HYDROmorphone (DILAUDID) 4 MG tablet Take 0.5-2 tablets (2-8 mg total) by mouth every 6 (six) hours as needed.  30 tablet  0  . insulin glargine (LANTUS) 100 UNIT/ML injection Inject 0.6 mLs (60 Units total) into the skin 2 (two) times daily.  30 mL  12  . insulin lispro (HUMALOG) 100 UNIT/ML injection Inject 35-47 Units into the skin 3 (three) times daily before meals. Injects 47 units daily with breakfast, 35 units daily with lunch, and  45 units daily with supper.  40 mL  4  . Insulin Pen Needle (BD PEN NEEDLE NANO U/F) 32G X 4 MM MISC Use 4 times daily as directed.  150 each  3  . Insulin Pen Needle (EASY TOUCH PEN NEEDLES) 32G X 4 MM MISC Use as directed, diagnosis code is 250.00  100 each  3  . metFORMIN (GLUCOPHAGE) 1000 MG tablet Take 1,000 mg by mouth 2 (two) times daily with a meal.      . metoCLOPramide (REGLAN) 10 MG tablet Take 10 mg by mouth 3 (three) times daily.      . Oxycodone HCl 20 MG TABS Take 1 tablet (20 mg total) by mouth every 3 (three) hours as needed (pain).  240 tablet  0  . potassium chloride (MICRO-K) 10 MEQ CR capsule Take 1 capsule (10 mEq total) by mouth daily.  90 capsule  3  . promethazine (PHENERGAN) 25 MG tablet Take 25 mg by mouth every 6 (six) hours as needed for nausea. For nausea      . rifaximin (XIFAXAN) 550 MG TABS tablet Take 550 mg by mouth 2 (two) times daily.      . traZODone (DESYREL) 100 MG tablet Take 2 tablets (200 mg total) by mouth at bedtime.  60 tablet  11   No current facility-administered medications for this visit.    SURGICAL HISTORY:  Past Surgical History  Procedure Laterality Date  . Appendectomy    . Cholecystectomy    . Colostomy    . Pneumonectomy    . Cervix removed    . Bowel resection    . Esophagogastroduodenoscopy  03/19/2011    Procedure: ESOPHAGOGASTRODUODENOSCOPY (EGD);  Surgeon: [name], MD;  Location: [facility];  Service: Endoscopy;  Laterality: N/A;  . Colonoscopy  03/19/2011    Procedure: COLONOSCOPY;  Surgeon: [name], MD;  Location: [facility];  Service: Endoscopy;  Laterality: N/A;  . Givens capsule study  03/20/2011    Procedure: GIVENS CAPSULE STUDY;  Surgeon: [name], MD;  Location: [facility];  Service: Endoscopy;  Laterality: N/A;  . Small bowel obstruction repair  March 2012  . Umbilical hernia repair  March 2012  . Splenectomy, total N/A 02/24/2013    Procedure: SPLENECTOMY;  Surgeon: Harl Bowie,  MD;  Location: [facility];  Service: General;  Laterality: N/A;    REVIEW OF SYSTEMS:  Constitutional: positive for fatigue Eyes: negative Ears, nose, mouth, throat, and face: negative Respiratory: positive for dyspnea on exertion Cardiovascular: negative Gastrointestinal: positive for Bleeding from the ostomy site. Genitourinary:negative Integument/breast: negative Hematologic/lymphatic: Anemia and low platelets Musculoskeletal:negative Neurological: negative Behavioral/Psych: negative Allergic/Immunologic: negative   PHYSICAL EXAMINATION: General appearance: alert,  cooperative, fatigued and no distress Head: Normocephalic, without obvious abnormality, atraumatic Neck: no adenopathy and no JVD Lymph nodes: Cervical, supraclavicular, and axillary nodes normal. Resp: clear to auscultation bilaterally Cardio: regular rate and rhythm, S1, S2 normal, no murmur, click, rub or gallop GI: soft, non-tender; bowel sounds normal; no masses,  no organomegaly Extremities: extremities normal, atraumatic, no cyanosis or edema Neurologic: Alert and oriented X 3, normal strength and tone. Normal symmetric reflexes. Normal coordination and gait  ECOG PERFORMANCE STATUS: 1 - Symptomatic but completely ambulatory  Blood pressure 169/45, pulse 57, temperature 98.7 F (37.1 C), temperature source Oral, resp. rate 17, height 5' (1.524 m), weight 180 lb 1.6 oz (81.693 kg), SpO2 95.00%.  LABORATORY DATA: Lab Results  Component Value Date   WBC 15.3* 03/30/2013   HGB 13.1 03/30/2013   HCT 41.0 03/30/2013   MCV 89.1 03/30/2013   PLT 481* 03/30/2013      Chemistry      Component Value Date/Time   NA 138 03/30/2013 1001   NA 140 03/08/2013 0110   K 4.6 03/30/2013 1001   K 4.5 03/08/2013 0110   CL 101 03/08/2013 0110   CL 103 03/23/2012 0938   CO2 22 03/30/2013 1001   CO2 26 03/08/2013 0110   BUN 16.0 03/30/2013 1001   BUN 8 03/08/2013 0110   CREATININE 1.0 03/30/2013 1001   CREATININE 0.78 03/08/2013 0110      Component  Value Date/Time   CALCIUM 14.1 Repeated and Verified* 03/30/2013 1001   CALCIUM 9.4 03/08/2013 0110   ALKPHOS 130 03/30/2013 1001   ALKPHOS 180* 03/08/2013 0110   AST 43* 03/30/2013 1001   AST 34 03/08/2013 0110   ALT 29 03/30/2013 1001   ALT 29 03/08/2013 0110   BILITOT 0.42 03/30/2013 1001   BILITOT 0.5 03/08/2013 0110       RADIOGRAPHIC STUDIES: BONE MARROW REPORT ASSESSMENT AND PLAN:  1) history of anemia and thrombocytopenia: Completely resolved after the splenectomy. 2) history of stage IB non-small cell lung cancer diagnosed in 2014. Continue on observation. 3) hypercalcemia most likely secondary to excessive intake of vitamin D: I advised the patient to start her vitamin D at this point is with her primary care physician for dose reduction in the future. I will arrange for the patient to receive Zometa 4 mg IV x 1 today to decrease her calcium level. I will have repeat comprehensive metabolic panel in 2 weeks for reevaluation of her calcium. 4)  Followup visit in 3 months with repeat CBC and comprehensive metabolic panel. She was advised to call immediately if she has any concerning symptoms in the interval. The patient voices understanding of current disease status and treatment options and is in agreement with the current care plan.  All questions were answered. The patient knows to call the clinic with any problems, questions or concerns. We can certainly see the patient much sooner if necessary.  I spent 15 minutes counseling the patient face to face. The total time spent in the appointment was 25 minutes.  Disclaimer: This note was dictated with voice recognition software. Similar sounding words can inadvertently be transcribed and may not be corrected upon review.

## 2013-04-12 ENCOUNTER — Ambulatory Visit (INDEPENDENT_AMBULATORY_CARE_PROVIDER_SITE_OTHER): Payer: Medicare Other | Admitting: Family Medicine

## 2013-04-12 ENCOUNTER — Encounter: Payer: Self-pay | Admitting: Family Medicine

## 2013-04-12 VITALS — BP 154/60 | HR 92 | Temp 98.0°F | Ht 60.0 in | Wt 183.0 lb

## 2013-04-12 DIAGNOSIS — I10 Essential (primary) hypertension: Secondary | ICD-10-CM

## 2013-04-12 DIAGNOSIS — E1149 Type 2 diabetes mellitus with other diabetic neurological complication: Secondary | ICD-10-CM

## 2013-04-12 DIAGNOSIS — Z23 Encounter for immunization: Secondary | ICD-10-CM

## 2013-04-12 DIAGNOSIS — F3289 Other specified depressive episodes: Secondary | ICD-10-CM

## 2013-04-12 DIAGNOSIS — F329 Major depressive disorder, single episode, unspecified: Secondary | ICD-10-CM

## 2013-04-12 DIAGNOSIS — D696 Thrombocytopenia, unspecified: Secondary | ICD-10-CM

## 2013-04-12 DIAGNOSIS — J449 Chronic obstructive pulmonary disease, unspecified: Secondary | ICD-10-CM

## 2013-04-12 MED ORDER — DICYCLOMINE HCL 10 MG PO CAPS: ORAL_CAPSULE | ORAL | Status: DC

## 2013-04-12 MED ORDER — TRAZODONE HCL 100 MG PO TABS: 200.0000 mg | ORAL_TABLET | Freq: Every day | ORAL | Status: DC

## 2013-04-12 MED ORDER — OXYCODONE HCL 20 MG PO TABS: 20.0000 mg | ORAL_TABLET | ORAL | Status: DC | PRN

## 2013-04-12 MED ORDER — FOLIC ACID 1 MG PO TABS: 1.0000 mg | ORAL_TABLET | Freq: Every evening | ORAL | Status: DC

## 2013-04-12 MED ORDER — METOCLOPRAMIDE HCL 10 MG PO TABS: 10.0000 mg | ORAL_TABLET | Freq: Three times a day (TID) | ORAL | Status: DC

## 2013-04-12 MED ORDER — FLUTICASONE-SALMETEROL 250-50 MCG/DOSE IN AEPB: 1.0000 | INHALATION_SPRAY | Freq: Two times a day (BID) | RESPIRATORY_TRACT | Status: DC

## 2013-04-12 NOTE — Addendum Note (Signed)
Addended by: Aggie Hacker A on: 04/12/2013 12:13 PM   Modules accepted: Orders

## 2013-04-12 NOTE — Progress Notes (Signed)
Pre visit review using our clinic review tool, if applicable. No additional management support is needed unless otherwise documented below in the visit note. 

## 2013-04-12 NOTE — Progress Notes (Signed)
   Subjective:    Patient ID: [name], female    DOB: [birth year], 63 y.o.   MRN: [id number]  HPI Here to follow up a hospital stay from 02-24-13 to 03-08-13 for an elective splenectomy per Dr. Rush Farmer. This went well. She received a few units of blood for anemia. She has followed up with both Dr. Rush Farmer and Dr. Julien Nordmann since going home. She feels well. She is getting Reclast to bring her calcium level down. She had some leg edema but this has resolved.   Review of Systems  Constitutional: Negative.   Respiratory: Negative.   Cardiovascular: Negative.        Objective:   Physical Exam  Constitutional: She appears well-developed and well-nourished.  Cardiovascular: Normal rate, regular rhythm, normal heart sounds and intact distal pulses.   Pulmonary/Chest: Effort normal and breath sounds normal.  Musculoskeletal: She exhibits no edema.          Assessment & Plan:  She is doing well. Given a Prevnar booster.

## 2013-04-12 NOTE — Addendum Note (Signed)
Addended by: Alysia Penna A on: 04/12/2013 11:41 AM   Modules accepted: Orders

## 2013-04-13 ENCOUNTER — Ambulatory Visit (HOSPITAL_BASED_OUTPATIENT_CLINIC_OR_DEPARTMENT_OTHER): Payer: Medicare Other

## 2013-04-13 ENCOUNTER — Encounter: Payer: Self-pay | Admitting: Physician Assistant

## 2013-04-13 ENCOUNTER — Telehealth: Payer: Self-pay | Admitting: Family Medicine

## 2013-04-13 ENCOUNTER — Ambulatory Visit (HOSPITAL_BASED_OUTPATIENT_CLINIC_OR_DEPARTMENT_OTHER): Payer: Medicare Other | Admitting: Physician Assistant

## 2013-04-13 ENCOUNTER — Telehealth: Payer: Self-pay | Admitting: Internal Medicine

## 2013-04-13 DIAGNOSIS — D696 Thrombocytopenia, unspecified: Secondary | ICD-10-CM

## 2013-04-13 DIAGNOSIS — D649 Anemia, unspecified: Secondary | ICD-10-CM

## 2013-04-13 DIAGNOSIS — Z85118 Personal history of other malignant neoplasm of bronchus and lung: Secondary | ICD-10-CM

## 2013-04-13 LAB — COMPREHENSIVE METABOLIC PANEL (CC13)
ALT: 57 U/L — ABNORMAL HIGH (ref 0–55)
AST: 64 U/L — AB (ref 5–34)
Albumin: 3.3 g/dL — ABNORMAL LOW (ref 3.5–5.0)
Alkaline Phosphatase: 160 U/L — ABNORMAL HIGH (ref 40–150)
Anion Gap: 11 mEq/L (ref 3–11)
BUN: 15.9 mg/dL (ref 7.0–26.0)
CHLORIDE: 107 meq/L (ref 98–109)
CO2: 22 mEq/L (ref 22–29)
CREATININE: 1 mg/dL (ref 0.6–1.1)
Calcium: 10.1 mg/dL (ref 8.4–10.4)
Glucose: 121 mg/dl (ref 70–140)
Potassium: 4.7 mEq/L (ref 3.5–5.1)
Sodium: 140 mEq/L (ref 136–145)
Total Bilirubin: 0.3 mg/dL (ref 0.20–1.20)
Total Protein: 7.6 g/dL (ref 6.4–8.3)

## 2013-04-13 LAB — CBC WITH DIFFERENTIAL/PLATELET
BASO%: 0.6 % (ref 0.0–2.0)
Basophils Absolute: 0.1 10*3/uL (ref 0.0–0.1)
EOS%: 10.5 % — ABNORMAL HIGH (ref 0.0–7.0)
Eosinophils Absolute: 1.9 10*3/uL — ABNORMAL HIGH (ref 0.0–0.5)
HCT: 38.3 % (ref 34.8–46.6)
HGB: 12.2 g/dL (ref 11.6–15.9)
LYMPH%: 24.9 % (ref 14.0–49.7)
MCH: 27.7 pg (ref 25.1–34.0)
MCHC: 31.9 g/dL (ref 31.5–36.0)
MCV: 87 fL (ref 79.5–101.0)
MONO#: 2.5 10*3/uL — AB (ref 0.1–0.9)
MONO%: 13.8 % (ref 0.0–14.0)
NEUT#: 9.1 10*3/uL — ABNORMAL HIGH (ref 1.5–6.5)
NEUT%: 50.2 % (ref 38.4–76.8)
NRBC: 0 % (ref 0–0)
PLATELETS: 445 10*3/uL — AB (ref 145–400)
RBC: 4.4 10*6/uL (ref 3.70–5.45)
RDW: 15.3 % — ABNORMAL HIGH (ref 11.2–14.5)
WBC: 18.2 10*3/uL — AB (ref 3.9–10.3)
lymph#: 4.5 10*3/uL — ABNORMAL HIGH (ref 0.9–3.3)

## 2013-04-13 LAB — TECHNOLOGIST REVIEW

## 2013-04-13 NOTE — Patient Instructions (Signed)
Followup with Dr. Julien Nordmann as previously scheduled

## 2013-04-13 NOTE — Telephone Encounter (Signed)
gv adn printed appt sched and avs for pt for March thru May....sent pt to lab

## 2013-04-13 NOTE — Telephone Encounter (Signed)
Relevant patient education assigned to patient using Emmi. ° °

## 2013-04-13 NOTE — Progress Notes (Signed)
[facility] [phone]   [phone]  OFFICE PROGRESS NOTE  [name], MD [place] [address]  DIAGNOSIS:  1) stage IB non-small cell lung cancer diagnosed in April 2004 status post left upper lobectomy by Dr. Roxan Hockey.  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  3) anemia of chronic disease plus/minus deficiency secondary to GI bleed. \ 4) hypercalcemia most likely secondary to excessive vitamin D intake.  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 Ninfa Linden.  CURRENT THERAPY:  Zometa 4 mg IV X one today  INTERVAL HISTORY: [name] 63 y.o. female returns to the clinic today for followup visit. She continues to complain of persistent fatigue. She reports only one episode of bleeding from the colostomy bag since her splenectomy surgery. She feels that the bleeding may have been irritative in nature as she had diarrhea at that time.  She recently underwent splenectomy under the care of Dr. Ninfa Linden. She has significant improvement in her anemia and thrombocytopenia after the surgery. She denied having any other significant complaints and specifically no nausea or vomiting, no fever or chills. She denied having any significant weight loss or night sweats. She has no chest pain but continues to have shortness of breath with exertion. She has no other bleeding, bruises or ecchymosis. She was found to be hypercalcemic with a calcium of 14.1. She was treated with a 1 time infusion of Zometa 4 mg IV x1 on 03/31/2011. She presents today for reevaluation of her calcium level. In the interim she has discontinued her over-the-counter vitamin D supplement as advised by Dr. Julien Nordmann. She saw her primary care physician, Dr. Sharlene Motts recently who concurred with this recommendation. According to the patient, Dr. Sharlene Motts was going to leave it up to Dr. Julien Nordmann when, if, as well as how much  vitamin D the patient should take in the future.   MEDICAL HISTORY: Past Medical History  Diagnosis Date  . Cirrhosis   . GERD (gastroesophageal reflux disease)   . Cervical disc syndrome   . Lung cancer     squamous cell  . Chronic respiratory failure   . Diverticulitis of colon   . Perforation of colon   . Arthritis   . IBS (irritable bowel syndrome)   . Chronic lower GI bleeding   . Overactive bladder   . Cervical cancer   . Thrombocytopenia     sees Dr. Julien Nordmann   . Splenomegaly   . Anxiety   . Depression   . Hypertension   . Asthma   . Chronic headache disorder   . Blood transfusion without reported diagnosis   . Heart murmur   . Hyperlipidemia   . Lung cancer   . COPD (chronic obstructive pulmonary disease)     sees Dr. Gwenette Greet   . Anemia     sees Dr. Julien Nordmann, due to chronic disease and GI losses   . Diabetes mellitus     sees Dr. Cruzita Lederer   . Colostomy care   . Hypercalcemia     ALLERGIES:  is allergic to penicillins; codeine phosphate; hydrocodone-acetaminophen; morphine; other; and cephalexin.  MEDICATIONS:  Current Outpatient Prescriptions  Medication Sig Dispense Refill  . albuterol-ipratropium (COMBIVENT) 18-103 MCG/ACT inhaler Inhale 2 puffs into the lungs every 4 (four) hours as needed for wheezing or shortness of breath.       [name] aspirin 325 MG tablet Take 1 tablet (325 mg total)  by mouth daily.  30 tablet  0  . diazepam (VALIUM) 5 MG tablet Take 5 mg by mouth 3 (three) times daily as needed for anxiety.      . dicyclomine (BENTYL) 10 MG capsule TAKE ONE CAPSULE BY MOUTH THREE TIMES DAILY  270 capsule  3  . esomeprazole (NEXIUM) 40 MG capsule Take 40 mg by mouth every evening.      . Fluticasone-Salmeterol (ADVAIR) 250-50 MCG/DOSE AEPB Inhale 1 puff into the lungs every 12 (twelve) hours.  161 each  3  . folic acid (FOLVITE) 1 MG tablet Take 1 tablet (1 mg total) by mouth every evening.  90 tablet  3  . furosemide (LASIX) 20 MG tablet Take 20-40 mg by  mouth daily as needed for fluid.      [name] HYDROmorphone (DILAUDID) 4 MG tablet Take 0.5-2 tablets (2-8 mg total) by mouth every 6 (six) hours as needed.  30 tablet  0  . insulin glargine (LANTUS) 100 UNIT/ML injection Inject 0.6 mLs (60 Units total) into the skin 2 (two) times daily.  30 mL  12  . insulin lispro (HUMALOG) 100 UNIT/ML injection Inject 35-47 Units into the skin 3 (three) times daily before meals. Injects 47 units daily with breakfast, 35 units daily with lunch, and 45 units daily with supper.  40 mL  4  . Insulin Pen Needle (BD PEN NEEDLE NANO U/F) 32G X 4 MM MISC Use 4 times daily as directed.  150 each  3  . Insulin Pen Needle (EASY TOUCH PEN NEEDLES) 32G X 4 MM MISC Use as directed, diagnosis code is 250.00  100 each  3  . metFORMIN (GLUCOPHAGE) 1000 MG tablet Take 1,000 mg by mouth 2 (two) times daily with a meal.      . metoCLOPramide (REGLAN) 10 MG tablet Take 1 tablet (10 mg total) by mouth 3 (three) times daily.  270 tablet  3  . Oxycodone HCl 20 MG TABS Take 1 tablet (20 mg total) by mouth every 3 (three) hours as needed (pain).  240 tablet  0  . potassium chloride (MICRO-K) 10 MEQ CR capsule Take 1 capsule (10 mEq total) by mouth daily.  90 capsule  3  . promethazine (PHENERGAN) 25 MG tablet Take 25 mg by mouth every 6 (six) hours as needed for nausea. For nausea      . rifaximin (XIFAXAN) 550 MG TABS tablet Take 550 mg by mouth 2 (two) times daily.      . traZODone (DESYREL) 100 MG tablet Take 2 tablets (200 mg total) by mouth at bedtime.  180 tablet  3   No current facility-administered medications for this visit.    SURGICAL HISTORY:  Past Surgical History  Procedure Laterality Date  . Appendectomy    . Cholecystectomy    . Colostomy    . Pneumonectomy    . Cervix removed    . Bowel resection    . Esophagogastroduodenoscopy  03/19/2011    Procedure: ESOPHAGOGASTRODUODENOSCOPY (EGD);  Surgeon: [name], MD;  Location: [facility];  Service: Endoscopy;   Laterality: N/A;  . Colonoscopy  03/19/2011    Procedure: COLONOSCOPY;  Surgeon: [name], MD;  Location: [facility];  Service: Endoscopy;  Laterality: N/A;  . Givens capsule study  03/20/2011    Procedure: GIVENS CAPSULE STUDY;  Surgeon: [name], MD;  Location: [facility];  Service: Endoscopy;  Laterality: N/A;  . Small bowel obstruction repair  March 2012  . Umbilical hernia  repair  March 2012  . Splenectomy, total N/A 02/24/2013    Procedure: SPLENECTOMY;  Surgeon: Harl Bowie, MD;  Location: [facility];  Service: General;  Laterality: N/A;    REVIEW OF SYSTEMS:  Constitutional: positive for fatigue Eyes: negative Ears, nose, mouth, throat, and face: negative Respiratory: positive for dyspnea on exertion Cardiovascular: negative Gastrointestinal: positive for Bleeding from the ostomy site. Genitourinary:negative Integument/breast: negative Hematologic/lymphatic: negative Musculoskeletal:negative Neurological: negative Behavioral/Psych: negative Allergic/Immunologic: negative   PHYSICAL EXAMINATION: General appearance: alert, cooperative, fatigued and no distress Head: Normocephalic, without obvious abnormality, atraumatic Neck: no adenopathy and no JVD Lymph nodes: Cervical, supraclavicular, and axillary nodes normal. Resp: clear to auscultation bilaterally Cardio: regular rate and rhythm, S1, S2 normal, no murmur, click, rub or gallop GI: soft, non-tender; bowel sounds normal; no masses,  no organomegaly Extremities: extremities normal, atraumatic, no cyanosis or edema Neurologic: Alert and oriented X 3, normal strength and tone. Normal symmetric reflexes. Normal coordination and gait  ECOG PERFORMANCE STATUS: 1 - Symptomatic but completely ambulatory  Blood pressure 145/51, pulse 90, temperature 98.4 F (36.9 C), temperature source Oral, resp. rate 18, height 5' (1.524 m), weight 180 lb 11.2 oz (81.965 kg).  LABORATORY DATA: Lab Results  Component  Value Date   WBC 18.2* 04/13/2013   HGB 12.2 04/13/2013   HCT 38.3 04/13/2013   MCV 87.0 04/13/2013   PLT 445* 04/13/2013      Chemistry      Component Value Date/Time   NA 140 04/13/2013 1447   NA 140 03/08/2013 0110   K 4.7 04/13/2013 1447   K 4.5 03/08/2013 0110   CL 101 03/08/2013 0110   CL 103 03/23/2012 0938   CO2 22 04/13/2013 1447   CO2 26 03/08/2013 0110   BUN 15.9 04/13/2013 1447   BUN 8 03/08/2013 0110   CREATININE 1.0 04/13/2013 1447   CREATININE 0.78 03/08/2013 0110      Component Value Date/Time   CALCIUM 10.1 04/13/2013 1447   CALCIUM 9.4 03/08/2013 0110   ALKPHOS 160* 04/13/2013 1447   ALKPHOS 180* 03/08/2013 0110   AST 64* 04/13/2013 1447   AST 34 03/08/2013 0110   ALT 57* 04/13/2013 1447   ALT 29 03/08/2013 0110   BILITOT 0.30 04/13/2013 1447   BILITOT 0.5 03/08/2013 0110       RADIOGRAPHIC STUDIES: BONE MARROW REPORT ASSESSMENT AND PLAN:  1) history of anemia and thrombocytopenia: Completely resolved after the splenectomy. 2) history of stage IB non-small cell lung cancer diagnosed in 2014. Continue on observation. 3) hypercalcemia most likely secondary to excessive intake of vitamin D: Recheck of her calcium today reveals a to be 10.1. Patient was advised for now to continue off of supplemental vitamin D. She is to followup as previously scheduled with Dr. Julien Nordmann towards the end of May 2015 with repeat labs at that visit as well. 4)  Followup visit in 3 months with repeat CBC and comprehensive metabolic panel. She was advised to call immediately if she has any concerning symptoms in the interval. The patient voices understanding of current disease status and treatment options and is in agreement with the current care plan.  All questions were answered. The patient knows to call the clinic with any problems, questions or concerns. We can certainly see the patient much sooner if necessary.  I spent 20 minutes counseling the patient face to face. The total time spent in the appointment  was 30 minutes.  Disclaimer: This note was dictated with voice recognition software.  Similar sounding words can inadvertently be transcribed and may not be corrected upon review.

## 2013-04-13 NOTE — Telephone Encounter (Signed)
Relevant patient education mailed to patient.  

## 2013-04-19 ENCOUNTER — Telehealth: Payer: Self-pay | Admitting: Medical Oncology

## 2013-04-19 NOTE — Telephone Encounter (Signed)
Asking about her Zometa treatments for hypercalcemia. Does she need to keep appointments? Sent to Dr Julien Nordmann.

## 2013-04-20 ENCOUNTER — Telehealth: Payer: Self-pay | Admitting: Internal Medicine

## 2013-04-20 ENCOUNTER — Other Ambulatory Visit: Payer: Self-pay | Admitting: *Deleted

## 2013-04-20 ENCOUNTER — Telehealth: Payer: Self-pay | Admitting: Medical Oncology

## 2013-04-20 ENCOUNTER — Telehealth: Payer: Self-pay

## 2013-04-20 MED ORDER — METFORMIN HCL 1000 MG PO TABS: 1000.0000 mg | ORAL_TABLET | Freq: Two times a day (BID) | ORAL | Status: DC

## 2013-04-20 NOTE — Telephone Encounter (Signed)
The patient called and is hoping to get a refill of her Metformin rx   Callback - [phone]

## 2013-04-20 NOTE — Telephone Encounter (Signed)
Pt notified and Onc Tx request sent to cancel the next two infusions

## 2013-04-20 NOTE — Telephone Encounter (Signed)
cx March and April appt per 3.24.15 pof....per pof pt aware

## 2013-04-20 NOTE — Telephone Encounter (Signed)
Message copied by Ardeen Garland on Tue Apr 20, 2013  8:52 AM ------      Message from: Curt Bears      Created: Mon Apr 19, 2013  9:32 PM       No, Ca is good.      ----- Message -----         From: Ardeen Garland, RN         Sent: 04/19/2013   4:04 PM           To: Curt Bears, MD            Does she need to keep all the infusion appointments for  zometa for  hx hypercalcemia?       ------

## 2013-04-21 ENCOUNTER — Encounter (HOSPITAL_COMMUNITY): Payer: Medicare Other | Admitting: Anesthesiology

## 2013-04-21 ENCOUNTER — Emergency Department (HOSPITAL_COMMUNITY): Payer: Medicare Other

## 2013-04-21 ENCOUNTER — Encounter (HOSPITAL_COMMUNITY)
Admission: EM | Disposition: A | Discharge status: Rehabilitation Facility | Payer: Self-pay | Source: Home / Self Care | Attending: Internal Medicine

## 2013-04-21 ENCOUNTER — Inpatient Hospital Stay (HOSPITAL_COMMUNITY): Payer: Medicare Other | Admitting: Anesthesiology

## 2013-04-21 ENCOUNTER — Inpatient Hospital Stay (HOSPITAL_COMMUNITY)
Admission: EM | Admit: 2013-04-21 | Discharge: 2013-04-26 | DRG: 493 | Disposition: A | Discharge status: Rehabilitation Facility | Payer: Medicare Other | Attending: Internal Medicine | Admitting: Internal Medicine

## 2013-04-21 ENCOUNTER — Encounter (HOSPITAL_COMMUNITY): Payer: Self-pay | Admitting: Emergency Medicine

## 2013-04-21 ENCOUNTER — Inpatient Hospital Stay (HOSPITAL_COMMUNITY): Payer: Medicare Other

## 2013-04-21 DIAGNOSIS — K589 Irritable bowel syndrome without diarrhea: Secondary | ICD-10-CM

## 2013-04-21 DIAGNOSIS — K7460 Unspecified cirrhosis of liver: Secondary | ICD-10-CM | POA: Diagnosis present

## 2013-04-21 DIAGNOSIS — E1149 Type 2 diabetes mellitus with other diabetic neurological complication: Secondary | ICD-10-CM | POA: Diagnosis present

## 2013-04-21 DIAGNOSIS — E1142 Type 2 diabetes mellitus with diabetic polyneuropathy: Secondary | ICD-10-CM | POA: Diagnosis present

## 2013-04-21 DIAGNOSIS — S82009A Unspecified fracture of unspecified patella, initial encounter for closed fracture: Principal | ICD-10-CM | POA: Diagnosis present

## 2013-04-21 DIAGNOSIS — E1140 Type 2 diabetes mellitus with diabetic neuropathy, unspecified: Secondary | ICD-10-CM | POA: Diagnosis present

## 2013-04-21 DIAGNOSIS — E785 Hyperlipidemia, unspecified: Secondary | ICD-10-CM | POA: Diagnosis present

## 2013-04-21 DIAGNOSIS — Z794 Long term (current) use of insulin: Secondary | ICD-10-CM

## 2013-04-21 DIAGNOSIS — Z9981 Dependence on supplemental oxygen: Secondary | ICD-10-CM

## 2013-04-21 DIAGNOSIS — S0181XA Laceration without foreign body of other part of head, initial encounter: Secondary | ICD-10-CM

## 2013-04-21 DIAGNOSIS — J449 Chronic obstructive pulmonary disease, unspecified: Secondary | ICD-10-CM

## 2013-04-21 DIAGNOSIS — Z86010 Personal history of colonic polyps: Secondary | ICD-10-CM

## 2013-04-21 DIAGNOSIS — E1101 Type 2 diabetes mellitus with hyperosmolarity with coma: Secondary | ICD-10-CM

## 2013-04-21 DIAGNOSIS — K922 Gastrointestinal hemorrhage, unspecified: Secondary | ICD-10-CM

## 2013-04-21 DIAGNOSIS — C3490 Malignant neoplasm of unspecified part of unspecified bronchus or lung: Secondary | ICD-10-CM

## 2013-04-21 DIAGNOSIS — Z87891 Personal history of nicotine dependence: Secondary | ICD-10-CM

## 2013-04-21 DIAGNOSIS — R161 Splenomegaly, not elsewhere classified: Secondary | ICD-10-CM

## 2013-04-21 DIAGNOSIS — N179 Acute kidney failure, unspecified: Secondary | ICD-10-CM

## 2013-04-21 DIAGNOSIS — R51 Headache: Secondary | ICD-10-CM

## 2013-04-21 DIAGNOSIS — J4489 Other specified chronic obstructive pulmonary disease: Secondary | ICD-10-CM | POA: Diagnosis present

## 2013-04-21 DIAGNOSIS — D72829 Elevated white blood cell count, unspecified: Secondary | ICD-10-CM | POA: Diagnosis present

## 2013-04-21 DIAGNOSIS — Y92009 Unspecified place in unspecified non-institutional (private) residence as the place of occurrence of the external cause: Secondary | ICD-10-CM

## 2013-04-21 DIAGNOSIS — M25569 Pain in unspecified knee: Secondary | ICD-10-CM

## 2013-04-21 DIAGNOSIS — Z8541 Personal history of malignant neoplasm of cervix uteri: Secondary | ICD-10-CM

## 2013-04-21 DIAGNOSIS — F411 Generalized anxiety disorder: Secondary | ICD-10-CM | POA: Diagnosis present

## 2013-04-21 DIAGNOSIS — J9610 Chronic respiratory failure, unspecified whether with hypoxia or hypercapnia: Secondary | ICD-10-CM | POA: Diagnosis present

## 2013-04-21 DIAGNOSIS — F3289 Other specified depressive episodes: Secondary | ICD-10-CM | POA: Diagnosis present

## 2013-04-21 DIAGNOSIS — Z933 Colostomy status: Secondary | ICD-10-CM

## 2013-04-21 DIAGNOSIS — I10 Essential (primary) hypertension: Secondary | ICD-10-CM

## 2013-04-21 DIAGNOSIS — W010XXA Fall on same level from slipping, tripping and stumbling without subsequent striking against object, initial encounter: Secondary | ICD-10-CM | POA: Diagnosis present

## 2013-04-21 DIAGNOSIS — Z7982 Long term (current) use of aspirin: Secondary | ICD-10-CM

## 2013-04-21 DIAGNOSIS — D696 Thrombocytopenia, unspecified: Secondary | ICD-10-CM

## 2013-04-21 DIAGNOSIS — IMO0002 Reserved for concepts with insufficient information to code with codable children: Secondary | ICD-10-CM | POA: Diagnosis present

## 2013-04-21 DIAGNOSIS — J189 Pneumonia, unspecified organism: Secondary | ICD-10-CM

## 2013-04-21 DIAGNOSIS — F339 Major depressive disorder, recurrent, unspecified: Secondary | ICD-10-CM | POA: Diagnosis present

## 2013-04-21 DIAGNOSIS — D638 Anemia in other chronic diseases classified elsewhere: Secondary | ICD-10-CM | POA: Diagnosis present

## 2013-04-21 DIAGNOSIS — Z79899 Other long term (current) drug therapy: Secondary | ICD-10-CM

## 2013-04-21 DIAGNOSIS — K219 Gastro-esophageal reflux disease without esophagitis: Secondary | ICD-10-CM

## 2013-04-21 DIAGNOSIS — D649 Anemia, unspecified: Secondary | ICD-10-CM

## 2013-04-21 DIAGNOSIS — E1165 Type 2 diabetes mellitus with hyperglycemia: Secondary | ICD-10-CM

## 2013-04-21 DIAGNOSIS — G8929 Other chronic pain: Secondary | ICD-10-CM | POA: Diagnosis present

## 2013-04-21 DIAGNOSIS — J439 Emphysema, unspecified: Secondary | ICD-10-CM | POA: Diagnosis present

## 2013-04-21 DIAGNOSIS — W19XXXA Unspecified fall, initial encounter: Secondary | ICD-10-CM

## 2013-04-21 DIAGNOSIS — F329 Major depressive disorder, single episode, unspecified: Secondary | ICD-10-CM | POA: Diagnosis present

## 2013-04-21 DIAGNOSIS — S82002A Unspecified fracture of left patella, initial encounter for closed fracture: Secondary | ICD-10-CM | POA: Diagnosis present

## 2013-04-21 DIAGNOSIS — D539 Nutritional anemia, unspecified: Secondary | ICD-10-CM

## 2013-04-21 DIAGNOSIS — Z85118 Personal history of other malignant neoplasm of bronchus and lung: Secondary | ICD-10-CM

## 2013-04-21 DIAGNOSIS — E875 Hyperkalemia: Secondary | ICD-10-CM | POA: Diagnosis present

## 2013-04-21 HISTORY — PX: ORIF PATELLA: SHX5033

## 2013-04-21 LAB — URINALYSIS, ROUTINE W REFLEX MICROSCOPIC
Bilirubin Urine: NEGATIVE
Glucose, UA: NEGATIVE mg/dL
HGB URINE DIPSTICK: NEGATIVE
Ketones, ur: NEGATIVE mg/dL
Nitrite: NEGATIVE
PH: 6 (ref 5.0–8.0)
Protein, ur: 30 mg/dL — AB
SPECIFIC GRAVITY, URINE: 1.01 (ref 1.005–1.030)
UROBILINOGEN UA: 0.2 mg/dL (ref 0.0–1.0)

## 2013-04-21 LAB — GLUCOSE, CAPILLARY: GLUCOSE-CAPILLARY: 127 mg/dL — AB (ref 70–99)

## 2013-04-21 LAB — URINE MICROSCOPIC-ADD ON

## 2013-04-21 LAB — CBC WITH DIFFERENTIAL/PLATELET
Basophils Absolute: 0.2 10*3/uL — ABNORMAL HIGH (ref 0.0–0.1)
Basophils Relative: 1 % (ref 0–1)
Eosinophils Absolute: 1.6 10*3/uL — ABNORMAL HIGH (ref 0.0–0.7)
Eosinophils Relative: 9 % — ABNORMAL HIGH (ref 0–5)
HEMATOCRIT: 38.3 % (ref 36.0–46.0)
Hemoglobin: 12.6 g/dL (ref 12.0–15.0)
LYMPHS ABS: 4.4 10*3/uL — AB (ref 0.7–4.0)
LYMPHS PCT: 25 % (ref 12–46)
MCH: 28.1 pg (ref 26.0–34.0)
MCHC: 32.9 g/dL (ref 30.0–36.0)
MCV: 85.5 fL (ref 78.0–100.0)
Monocytes Absolute: 2.8 10*3/uL — ABNORMAL HIGH (ref 0.1–1.0)
Monocytes Relative: 16 % — ABNORMAL HIGH (ref 3–12)
Neutro Abs: 8.5 10*3/uL — ABNORMAL HIGH (ref 1.7–7.7)
Neutrophils Relative %: 49 % (ref 43–77)
PLATELETS: 529 10*3/uL — AB (ref 150–400)
RBC: 4.48 MIL/uL (ref 3.87–5.11)
RDW: 15.2 % (ref 11.5–15.5)
WBC: 17.5 10*3/uL — AB (ref 4.0–10.5)

## 2013-04-21 LAB — COMPREHENSIVE METABOLIC PANEL
ALK PHOS: 153 U/L — AB (ref 39–117)
ALT: 54 U/L — AB (ref 0–35)
AST: 69 U/L — ABNORMAL HIGH (ref 0–37)
Albumin: 3.2 g/dL — ABNORMAL LOW (ref 3.5–5.2)
BUN: 14 mg/dL (ref 6–23)
CALCIUM: 10.3 mg/dL (ref 8.4–10.5)
CO2: 25 meq/L (ref 19–32)
Chloride: 100 mEq/L (ref 96–112)
Creatinine, Ser: 0.79 mg/dL (ref 0.50–1.10)
GFR, EST NON AFRICAN AMERICAN: 88 mL/min — AB (ref >90)
Glucose, Bld: 181 mg/dL — ABNORMAL HIGH (ref 70–99)
Potassium: 5.4 mEq/L — ABNORMAL HIGH (ref 3.7–5.3)
SODIUM: 138 meq/L (ref 137–147)
Total Bilirubin: 0.2 mg/dL — ABNORMAL LOW (ref 0.3–1.2)
Total Protein: 7.7 g/dL (ref 6.0–8.3)

## 2013-04-21 LAB — PROTIME-INR
INR: 1 (ref 0.00–1.49)
Prothrombin Time: 13 seconds (ref 11.6–15.2)

## 2013-04-21 SURGERY — OPEN REDUCTION INTERNAL FIXATION (ORIF) PATELLA
Anesthesia: General | Site: Knee | Laterality: Left

## 2013-04-21 MED ORDER — LIDOCAINE HCL (CARDIAC) 20 MG/ML IV SOLN
INTRAVENOUS | Status: AC
  Filled 2013-04-21: qty 5

## 2013-04-21 MED ORDER — SUCCINYLCHOLINE CHLORIDE 20 MG/ML IJ SOLN
INTRAMUSCULAR | Status: AC
  Filled 2013-04-21: qty 1

## 2013-04-21 MED ORDER — HYDROMORPHONE HCL PF 1 MG/ML IJ SOLN
INTRAMUSCULAR | Status: AC
  Filled 2013-04-21: qty 1

## 2013-04-21 MED ORDER — ONDANSETRON HCL 4 MG/2ML IJ SOLN
INTRAMUSCULAR | Status: AC
  Filled 2013-04-21: qty 2

## 2013-04-21 MED ORDER — MIDAZOLAM HCL 5 MG/5ML IJ SOLN
INTRAMUSCULAR | Status: DC | PRN
  Administered 2013-04-21: 2 mg via INTRAVENOUS

## 2013-04-21 MED ORDER — HYDROMORPHONE HCL PF 1 MG/ML IJ SOLN
1.0000 mg | Freq: Once | INTRAMUSCULAR | Status: AC
  Administered 2013-04-21: 1 mg via INTRAVENOUS
  Filled 2013-04-21: qty 1

## 2013-04-21 MED ORDER — GLYCOPYRROLATE 0.2 MG/ML IJ SOLN
INTRAMUSCULAR | Status: AC
  Filled 2013-04-21: qty 1

## 2013-04-21 MED ORDER — SUFENTANIL CITRATE 50 MCG/ML IV SOLN
INTRAVENOUS | Status: AC
  Filled 2013-04-21: qty 1

## 2013-04-21 MED ORDER — SODIUM CHLORIDE 0.9 % IJ SOLN
INTRAMUSCULAR | Status: AC
  Filled 2013-04-21: qty 10

## 2013-04-21 MED ORDER — MEPERIDINE HCL 25 MG/ML IJ SOLN
10.0000 mg | Freq: Once | INTRAMUSCULAR | Status: AC
  Administered 2013-04-21: 10 mg via INTRAVENOUS

## 2013-04-21 MED ORDER — ONDANSETRON HCL 4 MG/2ML IJ SOLN
INTRAMUSCULAR | Status: DC | PRN
  Administered 2013-04-21: 4 mg via INTRAVENOUS

## 2013-04-21 MED ORDER — SUFENTANIL CITRATE 50 MCG/ML IV SOLN
INTRAVENOUS | Status: DC | PRN
  Administered 2013-04-21 (×3): 10 ug via INTRAVENOUS

## 2013-04-21 MED ORDER — HYDROMORPHONE HCL PF 1 MG/ML IJ SOLN
0.5000 mg | Freq: Once | INTRAMUSCULAR | Status: AC
  Administered 2013-04-21: 0.5 mg via INTRAVENOUS
  Filled 2013-04-21: qty 1

## 2013-04-21 MED ORDER — LIDOCAINE HCL (CARDIAC) 20 MG/ML IV SOLN
INTRAVENOUS | Status: DC | PRN
  Administered 2013-04-21: 100 mg via INTRAVENOUS

## 2013-04-21 MED ORDER — MEPERIDINE HCL 25 MG/ML IJ SOLN
INTRAMUSCULAR | Status: AC
  Filled 2013-04-21: qty 1

## 2013-04-21 MED ORDER — SODIUM CHLORIDE 0.9 % IV BOLUS (SEPSIS)
500.0000 mL | Freq: Once | INTRAVENOUS | Status: AC
  Administered 2013-04-21: 500 mL via INTRAVENOUS

## 2013-04-21 MED ORDER — SODIUM CHLORIDE 0.9 % IV SOLN
INTRAVENOUS | Status: DC | PRN
  Administered 2013-04-21 (×2): via INTRAVENOUS

## 2013-04-21 MED ORDER — GLYCOPYRROLATE 0.2 MG/ML IJ SOLN
INTRAMUSCULAR | Status: DC | PRN
  Administered 2013-04-21: 0.2 mg via INTRAVENOUS

## 2013-04-21 MED ORDER — TETANUS-DIPHTH-ACELL PERTUSSIS 5-2.5-18.5 LF-MCG/0.5 IM SUSP
0.5000 mL | Freq: Once | INTRAMUSCULAR | Status: AC
  Administered 2013-04-21: 0.5 mL via INTRAMUSCULAR
  Filled 2013-04-21: qty 0.5

## 2013-04-21 MED ORDER — HYDROMORPHONE HCL PF 1 MG/ML IJ SOLN
0.2500 mg | INTRAMUSCULAR | Status: DC | PRN
  Administered 2013-04-21: 0.5 mg via INTRAVENOUS
  Administered 2013-04-21 (×2): 0.25 mg via INTRAVENOUS
  Administered 2013-04-22 (×2): 0.5 mg via INTRAVENOUS

## 2013-04-21 MED ORDER — ONDANSETRON HCL 4 MG/2ML IJ SOLN
4.0000 mg | Freq: Once | INTRAMUSCULAR | Status: AC
  Administered 2013-04-21: 4 mg via INTRAVENOUS
  Filled 2013-04-21: qty 2

## 2013-04-21 MED ORDER — PROPOFOL 10 MG/ML IV BOLUS
INTRAVENOUS | Status: DC | PRN
  Administered 2013-04-21: 140 mg via INTRAVENOUS

## 2013-04-21 MED ORDER — BUPIVACAINE HCL (PF) 0.25 % IJ SOLN
INTRAMUSCULAR | Status: AC
  Filled 2013-04-21: qty 30

## 2013-04-21 MED ORDER — BUPIVACAINE HCL (PF) 0.25 % IJ SOLN
INTRAMUSCULAR | Status: DC | PRN
  Administered 2013-04-21: 5 mL

## 2013-04-21 MED ORDER — MIDAZOLAM HCL 2 MG/2ML IJ SOLN
INTRAMUSCULAR | Status: AC
  Filled 2013-04-21: qty 2

## 2013-04-21 MED ORDER — SODIUM CHLORIDE 0.9 % IV SOLN: 10.0000 mg/h | Freq: Once | INTRAVENOUS | Status: DC

## 2013-04-21 MED ORDER — ONDANSETRON HCL 4 MG/2ML IJ SOLN
4.0000 mg | Freq: Once | INTRAMUSCULAR | Status: AC | PRN
  Administered 2013-04-21: 4 mg via INTRAVENOUS

## 2013-04-21 MED ORDER — ROCURONIUM BROMIDE 100 MG/10ML IV SOLN
INTRAVENOUS | Status: DC | PRN
  Administered 2013-04-21: 25 mg via INTRAVENOUS

## 2013-04-21 MED ORDER — 0.9 % SODIUM CHLORIDE (POUR BTL) OPTIME
TOPICAL | Status: DC | PRN
  Administered 2013-04-21: 1000 mL

## 2013-04-21 MED ORDER — ONDANSETRON HCL 4 MG/2ML IJ SOLN
INTRAMUSCULAR | Status: AC
Start: 2013-04-21 — End: 2013-04-21
  Filled 2013-04-21: qty 2

## 2013-04-21 MED ORDER — CLINDAMYCIN PHOSPHATE 600 MG/50ML IV SOLN
600.0000 mg | INTRAVENOUS | Status: AC
  Administered 2013-04-21: 600 mg via INTRAVENOUS
  Filled 2013-04-21: qty 50

## 2013-04-21 SURGICAL SUPPLY — 55 items
18 gauge wire ×2 IMPLANT
BANDAGE ELASTIC 4 VELCRO ST LF (GAUZE/BANDAGES/DRESSINGS) ×3 IMPLANT
BANDAGE ELASTIC 6 VELCRO ST LF (GAUZE/BANDAGES/DRESSINGS) ×3 IMPLANT
BANDAGE GAUZE ELAST BULKY 4 IN (GAUZE/BANDAGES/DRESSINGS) ×4 IMPLANT
BIT DRILL 3.8 CANN DISP (BIT) ×2 IMPLANT
BLADE SURG ROTATE 9660 (MISCELLANEOUS) ×3 IMPLANT
COVER SURGICAL LIGHT HANDLE (MISCELLANEOUS) ×3 IMPLANT
CUFF TOURNIQUET SINGLE 34IN LL (TOURNIQUET CUFF) ×2 IMPLANT
CUFF TOURNIQUET SINGLE 44IN (TOURNIQUET CUFF) IMPLANT
DRAPE C-ARM 42X72 X-RAY (DRAPES) ×3 IMPLANT
DRSG ADAPTIC 3X8 NADH LF (GAUZE/BANDAGES/DRESSINGS) ×3 IMPLANT
DRSG EMULSION OIL 3X3 NADH (GAUZE/BANDAGES/DRESSINGS) ×3 IMPLANT
DRSG PAD ABDOMINAL 8X10 ST (GAUZE/BANDAGES/DRESSINGS) ×3 IMPLANT
ELECT REM PT RETURN 9FT ADLT (ELECTROSURGICAL) ×3
ELECTRODE REM PT RTRN 9FT ADLT (ELECTROSURGICAL) ×1 IMPLANT
GLOVE BIOGEL PI ORTHO PRO 7.5 (GLOVE) ×2
GLOVE BIOGEL PI ORTHO PRO SZ8 (GLOVE) ×2
GLOVE ORTHO TXT STRL SZ7.5 (GLOVE) ×3 IMPLANT
GLOVE PI ORTHO PRO STRL 7.5 (GLOVE) ×1 IMPLANT
GLOVE PI ORTHO PRO STRL SZ8 (GLOVE) ×1 IMPLANT
GLOVE SURG ORTHO 8.5 STRL (GLOVE) ×3 IMPLANT
GOWN STRL REUS W/ TWL XL LVL3 (GOWN DISPOSABLE) ×2 IMPLANT
GOWN STRL REUS W/TWL XL LVL3 (GOWN DISPOSABLE) ×6
IMMOBILIZER KNEE 22 UNIV (SOFTGOODS) ×2 IMPLANT
KIT BASIN OR (CUSTOM PROCEDURE TRAY) ×3 IMPLANT
KIT ROOM TURNOVER OR (KITS) ×3 IMPLANT
MANIFOLD NEPTUNE II (INSTRUMENTS) ×3 IMPLANT
NEEDLE 22X1 1/2 (OR ONLY) (NEEDLE) ×2 IMPLANT
NS IRRIG 1000ML POUR BTL (IV SOLUTION) ×3 IMPLANT
PACK ORTHO EXTREMITY (CUSTOM PROCEDURE TRAY) ×3 IMPLANT
PAD ARMBOARD 7.5X6 YLW CONV (MISCELLANEOUS) ×6 IMPLANT
PAD CAST 4YDX4 CTTN HI CHSV (CAST SUPPLIES) ×2 IMPLANT
PADDING CAST COTTON 4X4 STRL (CAST SUPPLIES) ×3
PADDING CAST COTTON 6X4 STRL (CAST SUPPLIES) ×2 IMPLANT
PIN GUIDE THREADED 5/64X9-7MM (PIN) ×6 IMPLANT
SCREW CANN 5X38X20 (Screw) ×2 IMPLANT
SCREW LAG CANN CANC 5.0 20X40 (Screw) ×2 IMPLANT
SPONGE GAUZE 4X4 12PLY (GAUZE/BANDAGES/DRESSINGS) ×2 IMPLANT
SPONGE LAP 4X18 X RAY DECT (DISPOSABLE) ×6 IMPLANT
STAPLER VISISTAT 35W (STAPLE) ×3 IMPLANT
SUCTION FRAZIER TIP 10 FR DISP (SUCTIONS) ×3 IMPLANT
SUT FIBERWIRE #2 38 REV NDL BL (SUTURE)
SUT VIC AB 0 CT1 27 (SUTURE) ×3
SUT VIC AB 0 CT1 27XBRD ANBCTR (SUTURE) ×1 IMPLANT
SUT VIC AB 2-0 CT1 27 (SUTURE) ×6
SUT VIC AB 2-0 CT1 TAPERPNT 27 (SUTURE) ×2 IMPLANT
SUTURE FIBERWR#2 38 REV NDL BL (SUTURE) IMPLANT
SYR CONTROL 10ML LL (SYRINGE) ×2 IMPLANT
TOWEL OR 17X24 6PK STRL BLUE (TOWEL DISPOSABLE) ×3 IMPLANT
TOWEL OR 17X26 10 PK STRL BLUE (TOWEL DISPOSABLE) ×3 IMPLANT
TUBE CONNECTING 12'X1/4 (SUCTIONS) ×1
TUBE CONNECTING 12X1/4 (SUCTIONS) ×2 IMPLANT
UNDERPAD 30X30 INCONTINENT (UNDERPADS AND DIAPERS) ×3 IMPLANT
WATER STERILE IRR 1000ML POUR (IV SOLUTION) ×3 IMPLANT
YANKAUER SUCT BULB TIP NO VENT (SUCTIONS) ×2 IMPLANT

## 2013-04-21 NOTE — ED Notes (Signed)
Phlebotomy at bedside.

## 2013-04-21 NOTE — ED Notes (Signed)
Patient transported to X-ray 

## 2013-04-21 NOTE — Anesthesia Preprocedure Evaluation (Signed)
Anesthesia Evaluation  Patient identified by MRN, date of birth, ID band Patient awake    Reviewed: Allergy & Precautions, H&P , NPO status , Patient's Chart, lab work & pertinent test results  Airway       Dental   Pulmonary asthma , COPDformer smoker,          Cardiovascular hypertension,     Neuro/Psych  Headaches,    GI/Hepatic (+) Cirrhosis -       ,   Endo/Other  diabetes, Type 2, Insulin Dependent, Oral Hypoglycemic Agents  Renal/GU Renal disease     Musculoskeletal   Abdominal   Peds  Hematology   Anesthesia Other Findings Hx Lung CA  Reproductive/Obstetrics                           Anesthesia Physical Anesthesia Plan  ASA: III  Anesthesia Plan: General   Post-op Pain Management:    Induction: Intravenous  Airway Management Planned: LMA and Oral ETT  Additional Equipment:   Intra-op Plan:   Post-operative Plan: Extubation in OR  Informed Consent: I have reviewed the patients History and Physical, chart, labs and discussed the procedure including the risks, benefits and alternatives for the proposed anesthesia with the patient or authorized representative who has indicated his/her understanding and acceptance.     Plan Discussed with:   Anesthesia Plan Comments:         Anesthesia Quick Evaluation

## 2013-04-21 NOTE — Discharge Instructions (Signed)
No ROM of the knee (left) at this time, calf pumps and quad sets are ok,   WBAT with the knee immobilizer securely on the left leg.  Knee immobilizer should be padded with foam over the posterior stay at the thigh and the calf to prevent ulceration or breakdown of the skin.  Please check every day.  Knee immobilizer must stay on at all times.  Follow up in two weeks with Dr Veverly Fells  [phone]

## 2013-04-21 NOTE — Consult Note (Signed)
Reason for Consult:broken left patella after fall Referring Physician: EDP   [name] is an 63 y.o. female.  HPI: 63 yo female with numerous medical issues and balance problems who fell today on her walker and was unable to stand after the injury.  Patient complained of immediate left knee pain and the inability to extend her knee.  Patient had recent splenectomy which she was doing well with when she fell.  She is also a diabetic which she feels may play a role in her imbalance.  Past Medical History  Diagnosis Date  . Cirrhosis   . GERD (gastroesophageal reflux disease)   . Cervical disc syndrome   . Lung cancer     squamous cell  . Chronic respiratory failure   . Diverticulitis of colon   . Perforation of colon   . Arthritis   . IBS (irritable bowel syndrome)   . Chronic lower GI bleeding   . Overactive bladder   . Cervical cancer   . Thrombocytopenia     sees Dr. Julien Nordmann   . Splenomegaly   . Anxiety   . Depression   . Hypertension   . Asthma   . Chronic headache disorder   . Blood transfusion without reported diagnosis   . Heart murmur   . Hyperlipidemia   . Lung cancer   . COPD (chronic obstructive pulmonary disease)     sees Dr. Gwenette Greet   . Anemia     sees Dr. Julien Nordmann, due to chronic disease and GI losses   . Diabetes mellitus     sees Dr. Cruzita Lederer   . Colostomy care   . Hypercalcemia     Past Surgical History  Procedure Laterality Date  . Appendectomy    . Cholecystectomy    . Colostomy    . Pneumonectomy    . Cervix removed    . Bowel resection    . Esophagogastroduodenoscopy  03/19/2011    Procedure: ESOPHAGOGASTRODUODENOSCOPY (EGD);  Surgeon: [name], MD;  Location: [facility];  Service: Endoscopy;  Laterality: N/A;  . Colonoscopy  03/19/2011    Procedure: COLONOSCOPY;  Surgeon: [name], MD;  Location: [facility];  Service: Endoscopy;  Laterality: N/A;  . Givens capsule study  03/20/2011    Procedure: GIVENS CAPSULE STUDY;   Surgeon: [name], MD;  Location: [facility];  Service: Endoscopy;  Laterality: N/A;  . Small bowel obstruction repair  March 2012  . Umbilical hernia repair  March 2012  . Splenectomy, total N/A 02/24/2013    Procedure: SPLENECTOMY;  Surgeon: Harl Bowie, MD;  Location: [facility] OR;  Service: General;  Laterality: N/A;    Family History  Problem Relation Age of Onset  . Coronary artery disease    . Diabetes type II    . Anesthesia problems Neg Hx   . Hypotension Neg Hx   . Malignant hyperthermia Neg Hx   . Pseudochol deficiency Neg Hx   . Heart attack Mother   . Diabetes type II Mother   . Cirrhosis Father     Social History:  reports that she quit smoking about 11 years ago. Her smoking use included Cigarettes. She has a 70 pack-year smoking history. She has never used smokeless tobacco. She reports that she does not drink alcohol or use illicit drugs.  Allergies:  Allergies  Allergen Reactions  . Penicillins Anaphylaxis and Rash  . Codeine Phosphate Other (See Comments)    REACTION: Stomach cramps  . Hydrocodone-Acetaminophen Other (See  Comments)    REACTION: hallucinations  . Morphine Other (See Comments)    REACTION: Lowers BP  . Other Other (See Comments)    AGENT:  Per pt, cannot take blood thinners due to cirrhosis of the liver  . Cephalexin Swelling and Rash    Medications: I have reviewed the patient's current medications.  Results for orders placed during the hospital encounter of 04/21/13 (from the past 48 hour(s))  CBC WITH DIFFERENTIAL     Status: Abnormal   Collection Time    04/21/13  3:55 PM      Result Value Ref Range   WBC 17.5 (*) 4.0 - 10.5 K/uL   RBC 4.48  3.87 - 5.11 MIL/uL   Hemoglobin 12.6  12.0 - 15.0 g/dL   HCT 38.3  36.0 - 46.0 %   MCV 85.5  78.0 - 100.0 fL   MCH 28.1  26.0 - 34.0 pg   MCHC 32.9  30.0 - 36.0 g/dL   RDW 15.2  11.5 - 15.5 %   Platelets 529 (*) 150 - 400 K/uL   Neutrophils Relative % 49  43 - 77 %   Neutro Abs  8.5 (*) 1.7 - 7.7 K/uL   Lymphocytes Relative 25  12 - 46 %   Lymphs Abs 4.4 (*) 0.7 - 4.0 K/uL   Monocytes Relative 16 (*) 3 - 12 %   Monocytes Absolute 2.8 (*) 0.1 - 1.0 K/uL   Eosinophils Relative 9 (*) 0 - 5 %   Eosinophils Absolute 1.6 (*) 0.0 - 0.7 K/uL   Basophils Relative 1  0 - 1 %   Basophils Absolute 0.2 (*) 0.0 - 0.1 K/uL  COMPREHENSIVE METABOLIC PANEL     Status: Abnormal   Collection Time    04/21/13  3:55 PM      Result Value Ref Range   Sodium 138  137 - 147 mEq/L   Potassium 5.4 (*) 3.7 - 5.3 mEq/L   Chloride 100  96 - 112 mEq/L   CO2 25  19 - 32 mEq/L   Glucose, Bld 181 (*) 70 - 99 mg/dL   BUN 14  6 - 23 mg/dL   Creatinine, Ser 0.79  0.50 - 1.10 mg/dL   Calcium 10.3  8.4 - 10.5 mg/dL   Total Protein 7.7  6.0 - 8.3 g/dL   Albumin 3.2 (*) 3.5 - 5.2 g/dL   AST 69 (*) 0 - 37 U/L   ALT 54 (*) 0 - 35 U/L   Alkaline Phosphatase 153 (*) 39 - 117 U/L   Total Bilirubin 0.2 (*) 0.3 - 1.2 mg/dL   GFR calc non Af Amer 88 (*) >90 mL/min   GFR calc Af Amer >90  >90 mL/min   Comment: (NOTE)     The eGFR has been calculated using the CKD EPI equation.     This calculation has not been validated in all clinical situations.     eGFR's persistently <90 mL/min signify possible Chronic Kidney     Disease.  URINALYSIS, ROUTINE W REFLEX MICROSCOPIC     Status: Abnormal   Collection Time    04/21/13  4:33 PM      Result Value Ref Range   Color, Urine YELLOW  YELLOW   APPearance CLEAR  CLEAR   Specific Gravity, Urine 1.010  1.005 - 1.030   pH 6.0  5.0 - 8.0   Glucose, UA NEGATIVE  NEGATIVE mg/dL   Hgb urine dipstick NEGATIVE  NEGATIVE   Bilirubin  Urine NEGATIVE  NEGATIVE   Ketones, ur NEGATIVE  NEGATIVE mg/dL   Protein, ur 30 (*) NEGATIVE mg/dL   Urobilinogen, UA 0.2  0.0 - 1.0 mg/dL   Nitrite NEGATIVE  NEGATIVE   Leukocytes, UA TRACE (*) NEGATIVE  URINE MICROSCOPIC-ADD ON     Status: None   Collection Time    04/21/13  4:33 PM      Result Value Ref Range   Squamous  Epithelial / LPF RARE  RARE   WBC, UA 7-10  <3 WBC/hpf   Bacteria, UA RARE  RARE    Dg Chest 2 View  04/21/2013   CLINICAL DATA:  Fall.  Bilateral chest wall pain.  EXAM: CHEST  2 VIEW  COMPARISON:  To 03/2013  FINDINGS: Heart is mildly enlarged. No confluent airspace opacities in the lungs. Mild vascular congestion. No overt edema. No effusions or acute bony abnormality.  No visible rib fracture or pneumothorax.  IMPRESSION: Mild cardiomegaly, vascular congestion.   Electronically Signed   By: Rolm Baptise M.D.   On: 04/21/2013 17:58   Dg Pelvis 1-2 Views  04/21/2013   CLINICAL DATA:  Fall.  Pain.  EXAM: PELVIS - 1-2 VIEW  COMPARISON:  None.  FINDINGS: No acute bony abnormality. Specifically, no fracture, subluxation, or dislocation. Soft tissues are intact. SI joints and hip joints are symmetric.  IMPRESSION: No acute bony abnormality.   Electronically Signed   By: Rolm Baptise M.D.   On: 04/21/2013 18:04   Ct Head Wo Contrast  04/21/2013   CLINICAL DATA:  Recent traumatic injury and pain  EXAM: CT HEAD WITHOUT CONTRAST  TECHNIQUE: Contiguous axial images were obtained from the base of the skull through the vertex without intravenous contrast.  COMPARISON:  None.  FINDINGS: The bony calvarium is intact. No gross soft tissue abnormality is noted. Mild atrophic changes are seen. No findings to suggest acute hemorrhage, acute infarction or space-occupying mass lesion are noted.  IMPRESSION: Chronic changes without acute abnormality.   Electronically Signed   By: Inez Catalina M.D.   On: 04/21/2013 18:11   Dg Knee Complete 4 Views Left  04/21/2013   CLINICAL DATA:  [place], pain.  EXAM: LEFT KNEE - COMPLETE 4+ VIEW  COMPARISON:  None.  FINDINGS: There is a comminuted transverse patellar fracture with displacement of the proximal and distal fragments. Relatively dense knee effusion likely representing hemarthrosis. Distal femur and proximal tibia intact. Mild joint space narrowing.  IMPRESSION: Transverse  comminuted patellar fracture.  Probable hemarthrosis.   Electronically Signed   By: Rolla Flatten M.D.   On: 04/21/2013 15:27    ROS Blood pressure 168/41, pulse 45, temperature 98.5 F (36.9 C), temperature source Oral, resp. rate 18, SpO2 94.00%. Physical Exam  AAO, moderate distress, bilateral UEs non tender with normal AROM of both arms. 5/5 motor strength Right LE with normal AROM and no pain.  Left knee propped up with the skin intact over the patella, no ROM due to pain.  She has normal distal pulses  Assessment/Plan: Displaced left patella fracture.  Plan ORIF this evening.  Medical admission due to numerous medical comorbidties. Will start rehab tomorrow.  Likely short term SNF rehab stint needed.  Boleslaw Borghi,STEVEN R 04/21/2013, 8:04 PM

## 2013-04-21 NOTE — Transfer of Care (Signed)
Immediate Anesthesia Transfer of Care Note  Patient: [name]  Procedure(s) Performed: Procedure(s): OPEN REDUCTION INTERNAL (ORIF) FIXATION PATELLA (Left)  Patient Location: PACU  Anesthesia Type:General  Level of Consciousness: awake, alert , oriented and patient cooperative  Airway & Oxygen Therapy: Patient Spontanous Breathing and Patient connected to nasal cannula oxygen  Post-op Assessment: Report given to PACU RN, Post -op Vital signs reviewed and stable and Patient moving all extremities X 4  Post vital signs: Reviewed and stable  Complications: No apparent anesthesia complications

## 2013-04-21 NOTE — Anesthesia Procedure Notes (Signed)
Procedure Name: Intubation Date/Time: 04/21/2013 9:53 PM Performed by: BANKS, VERNON J Pre-anesthesia Checklist: Patient identified, Emergency Drugs available, Suction available and Patient being monitored Patient Re-evaluated:Patient Re-evaluated prior to inductionOxygen Delivery Method: Circle system utilized Preoxygenation: Pre-oxygenation with 100% oxygen Intubation Type: IV induction, Rapid sequence and Cricoid Pressure applied Ventilation: Mask ventilation without difficulty Laryngoscope Size: Mac and 4 Grade View: Grade I Tube type: Oral Tube size: 8.0 mm Number of attempts: 1 Airway Equipment and Method: Stylet and LTA kit utilized Placement Confirmation: ETT inserted through vocal cords under direct vision,  positive ETCO2 and breath sounds checked- equal and bilateral Secured at: 23 cm Tube secured with: Tape Dental Injury: Teeth and Oropharynx as per pre-operative assessment      

## 2013-04-21 NOTE — Anesthesia Postprocedure Evaluation (Signed)
  Anesthesia Post-op Note  Patient: [name]  Procedure(s) Performed: Procedure(s): OPEN REDUCTION INTERNAL (ORIF) FIXATION PATELLA (Left)  Patient Location: PACU  Anesthesia Type:General  Level of Consciousness: awake, alert , oriented and patient cooperative  Airway and Oxygen Therapy: Patient Spontanous Breathing  Post-op Pain: mild  Post-op Assessment: Post-op Vital signs reviewed, Patient's Cardiovascular Status Stable, Respiratory Function Stable, Patent Airway, No signs of Nausea or vomiting and Pain level controlled  Post-op Vital Signs: stable  Complications: No apparent anesthesia complications

## 2013-04-21 NOTE — H&P (Signed)
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 The patient also has a history of COPD and wears 2 L nasal cannula at nighttime. There was no syncope. The patient normally ambulates with a walker. There is a hallway at her home to which the patient is unable to use a walker. She was holding onto the wall and had a mechanical fall hitting her left leg. X-rays in the emergency department revealed a comminuted left patella fracture with possible hemarthrosis. Orthopedics was consulted, and Dr. Esmond Plants saw the patient.  He plans to take pt to OR tonight.  The patient denies any fevers, chills, chest discomfort, shortness of breath, nausea, vomiting, diarrhea, dysuria, hematuria. She complains of epigastric abdominal pain. This has been a chronic problem for her. It is no worse than usual. There is no hematochezia or melena. She currently denies any headaches, visual disturbance, focal tremor or weakness.  In the ED, CT of the brain was unremarkable. BMP showed potassium 5.4, WBC 17.5, chest x-ray with some mild vascular congestion. The patient was hemodynamically stable. Urinalysis was negative for pyuria. EKG shows sinus rhythm with T-wave inversion in V1, V2 Assessment/Plan: Comminuted left patellar fracture -Secondary to mechanical fall -ORIF tonight per Dr. 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 Cirrhosis   . GERD (gastroesophageal reflux disease)   . Cervical disc syndrome   . Lung cancer     squamous cell  . Chronic respiratory failure   . Diverticulitis of colon   . Perforation of colon   . Arthritis   . IBS (irritable bowel syndrome)   . Chronic lower GI bleeding   . Overactive bladder   . Cervical cancer   . Thrombocytopenia     sees Dr. Julien Nordmann   . Splenomegaly   . Anxiety   . Depression   . Hypertension   . Asthma   . Chronic headache disorder   . Blood transfusion without reported diagnosis   . Heart murmur   . Hyperlipidemia   . Lung cancer   . COPD (chronic obstructive pulmonary disease)     sees Dr. Gwenette Greet   . Anemia     sees Dr. Julien Nordmann, due to chronic disease and GI losses   . Diabetes mellitus     sees Dr. Cruzita Lederer   . Colostomy care   . Hypercalcemia    Past Surgical History  Procedure  Laterality Date  . Appendectomy    . Cholecystectomy    . Colostomy    . Pneumonectomy    . Cervix removed    . Bowel resection    . Esophagogastroduodenoscopy  03/19/2011    Procedure: ESOPHAGOGASTRODUODENOSCOPY (EGD);  Surgeon: [name], MD;  Location: [facility];  Service: Endoscopy;  Laterality: N/A;  . Colonoscopy  03/19/2011    Procedure: COLONOSCOPY;  Surgeon: [name], MD;   Location: [facility];  Service: Endoscopy;  Laterality: N/A;  . Givens capsule study  03/20/2011    Procedure: GIVENS CAPSULE STUDY;  Surgeon: [name], MD;  Location: [facility];  Service: Endoscopy;  Laterality: N/A;  . Small bowel obstruction repair  March 2012  . Umbilical hernia repair  March 2012  . Splenectomy, total N/A 02/24/2013    Procedure: SPLENECTOMY;  Surgeon: Harl Bowie, MD;  Location: [facility];  Service: General;  Laterality: N/A;   Social History:  reports that she quit smoking about 11 years ago. Her smoking use included Cigarettes. She has a 70 pack-year smoking history. She has never used smokeless tobacco. She reports that she does not drink alcohol or use illicit drugs.   Family History  Problem Relation Age of Onset  . Coronary artery disease    . Diabetes type II    . Anesthesia problems Neg Hx   . Hypotension Neg Hx   . Malignant hyperthermia Neg Hx   . Pseudochol deficiency Neg Hx   . Heart attack Mother   . Diabetes type II Mother   . Cirrhosis Father      Allergies  Allergen Reactions  . Penicillins Anaphylaxis and Rash  . Codeine Phosphate Other (See Comments)    REACTION: Stomach cramps  . Hydrocodone-Acetaminophen Other (See Comments)    REACTION: hallucinations  . Morphine Other (See Comments)    REACTION: Lowers BP  . Other Other (See Comments)    AGENT:  Per pt, cannot take blood thinners due to cirrhosis of the liver  . Cephalexin Swelling and Rash      Prior to Admission medications   Medication Sig Start Date End Date Taking? Authorizing Provider  albuterol-ipratropium (COMBIVENT) 18-103 MCG/ACT inhaler Inhale 2 puffs into the lungs every 4 (four) hours as needed for wheezing or shortness of breath.    Yes Historical Provider, MD  aspirin 325 MG tablet Take 1 tablet (325 mg total) by mouth daily. 03/08/13  Yes Harl Bowie, MD  diazepam (VALIUM) 5 MG tablet Take 5 mg by mouth 3 (three) times daily as needed for  anxiety.   Yes Historical Provider, MD  dicyclomine (BENTYL) 10 MG capsule TAKE ONE CAPSULE BY MOUTH THREE TIMES DAILY 04/12/13  Yes [name], MD  esomeprazole (NEXIUM) 40 MG capsule Take 40 mg by mouth every evening.   Yes Historical Provider, MD  Fluticasone-Salmeterol (ADVAIR) 250-50 MCG/DOSE AEPB Inhale 1 puff into the lungs every 12 (twelve) hours. 04/12/13  Yes [name], MD  folic acid (FOLVITE) 1 MG tablet Take 1 tablet (1 mg total) by mouth every evening. 04/12/13  Yes [name], MD  furosemide (LASIX) 20 MG tablet Take 20-40 mg by mouth daily as needed for fluid.   Yes Historical Provider, MD  insulin glargine (LANTUS) 100 UNIT/ML injection Inject 0.6 mLs (60 Units total) into the skin 2 (two) times daily. 02/02/13  Yes Philemon Kingdom, MD  insulin lispro (HUMALOG) 100 UNIT/ML injection Inject 35-47 Units into the skin  3 (three) times daily before meals. Injects 47 units daily with breakfast, 35 units daily with lunch, and 45 units daily with supper. 02/11/13  Yes Philemon Kingdom, MD  metFORMIN (GLUCOPHAGE) 1000 MG tablet Take 1 tablet (1,000 mg total) by mouth 2 (two) times daily with a meal. 04/20/13  Yes Philemon Kingdom, MD  metoCLOPramide (REGLAN) 10 MG tablet Take 1 tablet (10 mg total) by mouth 3 (three) times daily. 04/12/13  Yes [name], MD  Oxycodone HCl 20 MG TABS Take 1 tablet (20 mg total) by mouth every 3 (three) hours as needed (pain). 04/12/13  Yes [name], MD  potassium chloride (MICRO-K) 10 MEQ CR capsule Take 1 capsule (10 mEq total) by mouth daily. 12/21/12  Yes [name], MD  promethazine (PHENERGAN) 25 MG tablet Take 25 mg by mouth every 6 (six) hours as needed for nausea. For nausea 06/29/12  Yes [name], MD  rifaximin (XIFAXAN) 550 MG TABS tablet Take 550 mg by mouth 2 (two) times daily. 06/29/12  Yes [name], MD  traZODone (DESYREL) 100 MG tablet Take 2 tablets (200 mg total) by mouth at bedtime. 04/12/13  Yes [name], MD   HYDROmorphone (DILAUDID) 4 MG tablet Take 2-8 mg by mouth every 6 (six) hours as needed for moderate pain. 03/30/13 04/21/13  Harl Bowie, MD  Insulin Pen Needle (BD PEN NEEDLE NANO U/F) 32G X 4 MM MISC Use 4 times daily as directed. 12/30/12   Philemon Kingdom, MD  Insulin Pen Needle (EASY TOUCH PEN NEEDLES) 32G X 4 MM MISC Use as directed, diagnosis code is 250.00 11/25/12   [name], MD    Review of Systems:  Constitutional:  No weight loss, night sweats, Fevers, chills, fatigue.  Head&Eyes: No headache.  No vision loss.  No eye pain or scotoma ENT:  No Difficulty swallowing,Tooth/dental problems,Sore throat,  No ear ache, post nasal drip,  Cardio-vascular:  No chest pain, Orthopnea, PND, swelling in lower extremities,  dizziness, palpitations  GI:  No  abdominal pain, nausea, vomiting, diarrhea, loss of appetite, hematochezia, melena, heartburn, indigestion, Resp:  No shortness of breath with exertion or at rest. No cough. No coughing up of blood .No wheezing Skin:  no rash or lesions.  GU:  no dysuria, change in color of urine, no urgency or frequency. No flank pain.  Musculoskeletal:  No joint pain or swelling. No decreased range of motion. No back pain.  Psych:  No change in mood or affect.  Neurologic: No headache, no dysesthesia, no focal weakness, no vision loss. No syncope  Physical Exam: Filed Vitals:   04/21/13 1815 04/21/13 1845 04/21/13 1945 04/21/13 2035  BP: 198/53 168/41 125/61 117/62  Pulse: 71 45 57   Temp:      TempSrc:      Resp:    17  SpO2: 95% 94% 94% 97%   General:  A&O x 3, NAD, nontoxic, pleasant/cooperative Head/Eye: No conjunctival hemorrhage, no icterus, [place]/AT, No nystagmus ENT:  No icterus,  No thrush, good dentition, no pharyngeal exudate Neck:  No masses, no lymphadenpathy, no bruits CV:  RRR, no rub, no gallop, no S3 Lung:  Left basilar crackles. Right clear to auscultation. No wheezing. Good air movement. Abdomen: soft/NT,  +BS, nondistended, no peritoneal signs Ext: No cyanosis, No rashes, No petechiae, No lymphangitis, trace LE edema   Labs on Admission:  Basic Metabolic Panel:  Recent Labs Lab 04/21/13 1555  NA 138  K  5.4*  CL 100  CO2 25  GLUCOSE 181*  BUN 14  CREATININE 0.79  CALCIUM 10.3   Liver Function Tests:  Recent Labs Lab 04/21/13 1555  AST 69*  ALT 54*  ALKPHOS 153*  BILITOT 0.2*  PROT 7.7  ALBUMIN 3.2*   No results found for this basename: LIPASE, AMYLASE,  in the last 168 hours No results found for this basename: AMMONIA,  in the last 168 hours CBC:  Recent Labs Lab 04/21/13 1555  WBC 17.5*  NEUTROABS 8.5*  HGB 12.6  HCT 38.3  MCV 85.5  PLT 529*   Cardiac Enzymes: No results found for this basename: CKTOTAL, CKMB, CKMBINDEX, TROPONINI,  in the last 168 hours BNP: No components found with this basename: POCBNP,  CBG: No results found for this basename: GLUCAP,  in the last 168 hours  Radiological Exams on Admission: Dg Chest 2 View  04/21/2013   CLINICAL DATA:  Fall.  Bilateral chest wall pain.  EXAM: CHEST  2 VIEW  COMPARISON:  To 03/2013  FINDINGS: Heart is mildly enlarged. No confluent airspace opacities in the lungs. Mild vascular congestion. No overt edema. No effusions or acute bony abnormality.  No visible rib fracture or pneumothorax.  IMPRESSION: Mild cardiomegaly, vascular congestion.   Electronically Signed   By: Rolm Baptise M.D.   On: 04/21/2013 17:58   Dg Pelvis 1-2 Views  04/21/2013   CLINICAL DATA:  Fall.  Pain.  EXAM: PELVIS - 1-2 VIEW  COMPARISON:  None.  FINDINGS: No acute bony abnormality. Specifically, no fracture, subluxation, or dislocation. Soft tissues are intact. SI joints and hip joints are symmetric.  IMPRESSION: No acute bony abnormality.   Electronically Signed   By: Rolm Baptise M.D.   On: 04/21/2013 18:04   Ct Head Wo Contrast  04/21/2013   CLINICAL DATA:  Recent traumatic injury and pain  EXAM: CT HEAD WITHOUT CONTRAST  TECHNIQUE:  Contiguous axial images were obtained from the base of the skull through the vertex without intravenous contrast.  COMPARISON:  None.  FINDINGS: The bony calvarium is intact. No gross soft tissue abnormality is noted. Mild atrophic changes are seen. No findings to suggest acute hemorrhage, acute infarction or space-occupying mass lesion are noted.  IMPRESSION: Chronic changes without acute abnormality.   Electronically Signed   By: Inez Catalina M.D.   On: 04/21/2013 18:11   Dg Knee Complete 4 Views Left  04/21/2013   CLINICAL DATA:  [place], pain.  EXAM: LEFT KNEE - COMPLETE 4+ VIEW  COMPARISON:  None.  FINDINGS: There is a comminuted transverse patellar fracture with displacement of the proximal and distal fragments. Relatively dense knee effusion likely representing hemarthrosis. Distal femur and proximal tibia intact. Mild joint space narrowing.  IMPRESSION: Transverse comminuted patellar fracture.  Probable hemarthrosis.   Electronically Signed   By: Rolla Flatten M.D.   On: 04/21/2013 15:27    EKG: Independently reviewed. Sinus rhythm, T-wave inversion V1,V2    Time spent:60 minutes Code Status:   FULL Family Communication:   No Family at bedside   Evren Shankland, DO  [facility] Pager [phone]  If 7PM-7AM, please contact night-coverage [email or web address] Password [facility] 04/21/2013, 10:03 PM

## 2013-04-21 NOTE — Brief Op Note (Signed)
04/21/2013  11:36 PM  PATIENT:  [name]  63 y.o. female  PRE-OPERATIVE DIAGNOSIS:  Left Patella Fracture, Displaced  POST-OPERATIVE DIAGNOSIS:  Left Patella Fracture, Displaced  PROCEDURE:  Procedure(s): OPEN REDUCTION INTERNAL (ORIF) FIXATION PATELLA (Left), 5.0 Biomet cannulated screws and 18 ga wire  SURGEON:  Surgeon(s) and Role:    * Augustin Schooling, MD - Primary  PHYSICIAN ASSISTANT:   ASSISTANTS: Danae Orleans PA-C   ANESTHESIA:   general  EBL:  Total I/O In: 1100 [I.V.:1100] Out: -   BLOOD ADMINISTERED:none  DRAINS: none   LOCAL MEDICATIONS USED:  MARCAINE     SPECIMEN:  No Specimen  DISPOSITION OF SPECIMEN:  N/A  COUNTS:  YES  TOURNIQUET:  * No tourniquets in log *  DICTATION: .Other Dictation: Dictation Number [id number]  PLAN OF CARE: Admit to inpatient   PATIENT DISPOSITION:  PACU - hemodynamically stable.   Delay start of Pharmacological VTE agent (>24hrs) due to surgical blood loss or risk of bleeding: no

## 2013-04-21 NOTE — ED Provider Notes (Signed)
CSN: [id number]     Arrival date & time 04/21/13  1425 History   First MD Initiated Contact with Patient 04/21/13 1502     Chief Complaint  Patient presents with  . Knee Injury  . Fall     (Consider location/radiation/quality/duration/timing/severity/associated sxs/prior Treatment) Patient is a 63 y.o. female presenting with fall. The history is provided by the patient.  Fall Chronicity: has had falls in the past. The current episode started 1 to 2 hours ago. Episode frequency: once. The problem has been resolved. Associated symptoms include headaches. Pertinent negatives include no chest pain, no abdominal pain and no shortness of breath. Nothing aggravates the symptoms. Nothing relieves the symptoms. Treatments tried: 1mg  dilaudid IM prior to my eval. The treatment provided mild relief.    Past Medical History  Diagnosis Date  . Cirrhosis   . GERD (gastroesophageal reflux disease)   . Cervical disc syndrome   . Lung cancer     squamous cell  . Chronic respiratory failure   . Diverticulitis of colon   . Perforation of colon   . Arthritis   . IBS (irritable bowel syndrome)   . Chronic lower GI bleeding   . Overactive bladder   . Cervical cancer   . Thrombocytopenia     sees Dr. Julien Nordmann   . Splenomegaly   . Anxiety   . Depression   . Hypertension   . Asthma   . Chronic headache disorder   . Blood transfusion without reported diagnosis   . Heart murmur   . Hyperlipidemia   . Lung cancer   . COPD (chronic obstructive pulmonary disease)     sees Dr. Gwenette Greet   . Anemia     sees Dr. Julien Nordmann, due to chronic disease and GI losses   . Diabetes mellitus     sees Dr. Cruzita Lederer   . Colostomy care   . Hypercalcemia    Past Surgical History  Procedure Laterality Date  . Appendectomy    . Cholecystectomy    . Colostomy    . Pneumonectomy    . Cervix removed    . Bowel resection    . Esophagogastroduodenoscopy  03/19/2011    Procedure: ESOPHAGOGASTRODUODENOSCOPY (EGD);   Surgeon: [name], MD;  Location: [facility];  Service: Endoscopy;  Laterality: N/A;  . Colonoscopy  03/19/2011    Procedure: COLONOSCOPY;  Surgeon: [name], MD;  Location: [facility];  Service: Endoscopy;  Laterality: N/A;  . Givens capsule study  03/20/2011    Procedure: GIVENS CAPSULE STUDY;  Surgeon: [name], MD;  Location: [facility];  Service: Endoscopy;  Laterality: N/A;  . Small bowel obstruction repair  March 2012  . Umbilical hernia repair  March 2012  . Splenectomy, total N/A 02/24/2013    Procedure: SPLENECTOMY;  Surgeon: Harl Bowie, MD;  Location: [facility] OR;  Service: General;  Laterality: N/A;   Family History  Problem Relation Age of Onset  . Coronary artery disease    . Diabetes type II    . Anesthesia problems Neg Hx   . Hypotension Neg Hx   . Malignant hyperthermia Neg Hx   . Pseudochol deficiency Neg Hx   . Heart attack Mother   . Diabetes type II Mother   . Cirrhosis Father    History  Substance Use Topics  . Smoking status: Former Smoker -- 2.00 packs/day for 35 years    Types: Cigarettes    Quit date: 03/24/2002  . Smokeless tobacco: Never Used  .  Alcohol Use: No   OB History   Grav Para Term Preterm Abortions TAB SAB Ect Mult Living                 Review of Systems  Constitutional: Negative for fever and fatigue.  HENT: Negative for congestion and drooling.   Eyes: Negative for pain.  Respiratory: Negative for cough and shortness of breath.   Cardiovascular: Negative for chest pain.  Gastrointestinal: Negative for nausea, vomiting, abdominal pain and diarrhea.  Genitourinary: Negative for dysuria and hematuria.  Musculoskeletal: Negative for back pain, gait problem and neck pain.  Skin: Negative for color change.  Neurological: Positive for headaches. Negative for dizziness.  Hematological: Negative for adenopathy.  Psychiatric/Behavioral: Negative for behavioral problems.  All other systems reviewed and are  negative.      Allergies  Penicillins; Codeine phosphate; Hydrocodone-acetaminophen; Morphine; Other; and Cephalexin  Home Medications   Current Outpatient Rx  Name  Route  Sig  Dispense  Refill  . albuterol-ipratropium (COMBIVENT) 18-103 MCG/ACT inhaler   Inhalation   Inhale 2 puffs into the lungs every 4 (four) hours as needed for wheezing or shortness of breath.          [name] aspirin 325 MG tablet   Oral   Take 1 tablet (325 mg total) by mouth daily.   30 tablet   0   . diazepam (VALIUM) 5 MG tablet   Oral   Take 5 mg by mouth 3 (three) times daily as needed for anxiety.         . dicyclomine (BENTYL) 10 MG capsule      TAKE ONE CAPSULE BY MOUTH THREE TIMES DAILY   270 capsule   3   . esomeprazole (NEXIUM) 40 MG capsule   Oral   Take 40 mg by mouth every evening.         . Fluticasone-Salmeterol (ADVAIR) 250-50 MCG/DOSE AEPB   Inhalation   Inhale 1 puff into the lungs every 12 (twelve) hours.   409 each   3   . folic acid (FOLVITE) 1 MG tablet   Oral   Take 1 tablet (1 mg total) by mouth every evening.   90 tablet   3   . furosemide (LASIX) 20 MG tablet   Oral   Take 20-40 mg by mouth daily as needed for fluid.         [name] HYDROmorphone (DILAUDID) 4 MG tablet   Oral   Take 0.5-2 tablets (2-8 mg total) by mouth every 6 (six) hours as needed.   30 tablet   0   . insulin glargine (LANTUS) 100 UNIT/ML injection   Subcutaneous   Inject 0.6 mLs (60 Units total) into the skin 2 (two) times daily.   30 mL   12   . insulin lispro (HUMALOG) 100 UNIT/ML injection   Subcutaneous   Inject 35-47 Units into the skin 3 (three) times daily before meals. Injects 47 units daily with breakfast, 35 units daily with lunch, and 45 units daily with supper.   40 mL   4   . Insulin Pen Needle (BD PEN NEEDLE NANO U/F) 32G X 4 MM MISC      Use 4 times daily as directed.   150 each   3   . Insulin Pen Needle (EASY TOUCH PEN NEEDLES) 32G X 4 MM MISC      Use as  directed, diagnosis code is 250.00   100 each   3  Pt request the nano size pen needles   . metFORMIN (GLUCOPHAGE) 1000 MG tablet   Oral   Take 1 tablet (1,000 mg total) by mouth 2 (two) times daily with a meal.   60 tablet   1     PT NEEDS TO SCHEDULE A FOLLOW UP APPT.   . metoCLOPramide (REGLAN) 10 MG tablet   Oral   Take 1 tablet (10 mg total) by mouth 3 (three) times daily.   270 tablet   3   . Oxycodone HCl 20 MG TABS   Oral   Take 1 tablet (20 mg total) by mouth every 3 (three) hours as needed (pain).   240 tablet   0   . potassium chloride (MICRO-K) 10 MEQ CR capsule   Oral   Take 1 capsule (10 mEq total) by mouth daily.   90 capsule   3   . promethazine (PHENERGAN) 25 MG tablet   Oral   Take 25 mg by mouth every 6 (six) hours as needed for nausea. For nausea         . rifaximin (XIFAXAN) 550 MG TABS tablet   Oral   Take 550 mg by mouth 2 (two) times daily.         . traZODone (DESYREL) 100 MG tablet   Oral   Take 2 tablets (200 mg total) by mouth at bedtime.   180 tablet   3    BP 163/68  Pulse 88  Temp(Src) 98.5 F (36.9 C) (Oral)  Resp 12  SpO2 96% Physical Exam  Nursing note and vitals reviewed. Constitutional: She is oriented to person, place, and time. She appears well-developed and well-nourished.  HENT:  Head: Normocephalic.  Mouth/Throat: Oropharynx is clear and moist. No oropharyngeal exudate.  Small 1.5 cm superficial laceration to the right cheek. Very minimal tenderness to palpation in this area.  Eyes: Conjunctivae and EOM are normal. Pupils are equal, round, and reactive to light.  Neck: Normal range of motion. Neck supple.  No cervical tenderness to palpation.  Cardiovascular: Normal rate, regular rhythm, normal heart sounds and intact distal pulses.  Exam reveals no gallop and no friction rub.   No murmur heard. Pulmonary/Chest: Effort normal and breath sounds normal. No respiratory distress. She has no wheezes. She  exhibits tenderness (bilateral lateral chest wall tenderness to palpation.).  Abdominal: Soft. Bowel sounds are normal. There is no tenderness. There is no rebound and no guarding.  Normal appearing stoma. Colostomy w/ brown stool.   Musculoskeletal: Normal range of motion. She exhibits tenderness (diffuse tenderness to palpation of the left knee.). She exhibits no edema.  No focal tenderness to palpation of the hips bilaterally.  Very minimal tenderness to palpation of the right ulnar styloid process. Normal range of motion of the right wrist without pain. No snuffbox tenderness noted in the right hand.  Pt unable to perform straight leg raise in LLE.   2+ distal pulses.   Neurological: She is alert and oriented to person, place, and time. She has normal strength. No sensory deficit.  Skin: Skin is warm and dry.  Psychiatric: She has a normal mood and affect. Her behavior is normal.    ED Course  Procedures (including critical care time) Labs Review Labs Reviewed  CBC WITH DIFFERENTIAL - Abnormal; Notable for the following:    WBC 17.5 (*)    Platelets 529 (*)    Neutro Abs 8.5 (*)    Lymphs Abs 4.4 (*)    Monocytes  Relative 16 (*)    Monocytes Absolute 2.8 (*)    Eosinophils Relative 9 (*)    Eosinophils Absolute 1.6 (*)    Basophils Absolute 0.2 (*)    All other components within normal limits  COMPREHENSIVE METABOLIC PANEL - Abnormal; Notable for the following:    Potassium 5.4 (*)    Glucose, Bld 181 (*)    Albumin 3.2 (*)    AST 69 (*)    ALT 54 (*)    Alkaline Phosphatase 153 (*)    Total Bilirubin 0.2 (*)    GFR calc non Af Amer 88 (*)    All other components within normal limits  URINALYSIS, ROUTINE W REFLEX MICROSCOPIC - Abnormal; Notable for the following:    Protein, ur 30 (*)    Leukocytes, UA TRACE (*)    All other components within normal limits  URINE MICROSCOPIC-ADD ON  PROTIME-INR   Imaging Review Dg Chest 2 View  04/21/2013   CLINICAL DATA:  Fall.   Bilateral chest wall pain.  EXAM: CHEST  2 VIEW  COMPARISON:  To 03/2013  FINDINGS: Heart is mildly enlarged. No confluent airspace opacities in the lungs. Mild vascular congestion. No overt edema. No effusions or acute bony abnormality.  No visible rib fracture or pneumothorax.  IMPRESSION: Mild cardiomegaly, vascular congestion.   Electronically Signed   By: Rolm Baptise M.D.   On: 04/21/2013 17:58   Dg Pelvis 1-2 Views  04/21/2013   CLINICAL DATA:  Fall.  Pain.  EXAM: PELVIS - 1-2 VIEW  COMPARISON:  None.  FINDINGS: No acute bony abnormality. Specifically, no fracture, subluxation, or dislocation. Soft tissues are intact. SI joints and hip joints are symmetric.  IMPRESSION: No acute bony abnormality.   Electronically Signed   By: Rolm Baptise M.D.   On: 04/21/2013 18:04   Ct Head Wo Contrast  04/21/2013   CLINICAL DATA:  Recent traumatic injury and pain  EXAM: CT HEAD WITHOUT CONTRAST  TECHNIQUE: Contiguous axial images were obtained from the base of the skull through the vertex without intravenous contrast.  COMPARISON:  None.  FINDINGS: The bony calvarium is intact. No gross soft tissue abnormality is noted. Mild atrophic changes are seen. No findings to suggest acute hemorrhage, acute infarction or space-occupying mass lesion are noted.  IMPRESSION: Chronic changes without acute abnormality.   Electronically Signed   By: Inez Catalina M.D.   On: 04/21/2013 18:11   Dg Knee Complete 4 Views Left  04/21/2013   CLINICAL DATA:  [place], pain.  EXAM: LEFT KNEE - COMPLETE 4+ VIEW  COMPARISON:  None.  FINDINGS: There is a comminuted transverse patellar fracture with displacement of the proximal and distal fragments. Relatively dense knee effusion likely representing hemarthrosis. Distal femur and proximal tibia intact. Mild joint space narrowing.  IMPRESSION: Transverse comminuted patellar fracture.  Probable hemarthrosis.   Electronically Signed   By: Rolla Flatten M.D.   On: 04/21/2013 15:27     EKG  Interpretation   Date/Time:  Wednesday April 21 2013 16:24:09 EDT Ventricular Rate:  81 PR Interval:  149 QRS Duration: 88 QT Interval:  386 QTC Calculation: 448 R Axis:   -16 Text Interpretation:  Sinus rhythm Borderline left axis deviation RSR' in  V1 or V2, probably normal variant Otherwise no significant change  Confirmed by Marilyn Wing  MD, Talli Kimmer ([id number]) on 04/21/2013 5:03:50 PM      MDM   Final diagnoses:  Patella fracture  Fall  Laceration of face  3:18 PM 63 y.o. female with a history of lung cancer, cirrhosis of the liver, COPD, status post recent splenectomy in January of 2015 who presents with a mechanical fall which occurred approximately 2 hours ago. The patient states that she notes with a walker and the hallway she was going down is too small for the walker. She states that she traverses this hallway sideways and believes that she tripped over some oxygen tubing. She fell to the ground and hit her left knee and the right side of her face. She denies loss of consciousness. She is afebrile and vital signs are unremarkable here. She is complaining of left knee pain and a mild headache. Will get screening imaging and lab work. Will update tdap.   Found to have patella fx w/ displacement. Unable to perform straight leg raise in LLE. Consulted Ortho who will see the pt. Facial laceration will require no repair. I cleaned the wound w/ Saf Clens.   The patient will be admitted to [facility] hospital[facility]. Any medications given in the ED during this visit are listed below:  Medications  bupivacaine (PF) (MARCAINE) 0.25 % injection (5 mLs  Given 04/21/13 2216)  HYDROmorphone (DILAUDID) injection 1 mg (1 mg Intravenous Given 04/21/13 1446)  ondansetron (ZOFRAN) injection 4 mg (4 mg Intravenous Given 04/21/13 1446)  sodium chloride 0.9 % bolus 500 mL (0 mLs Intravenous Stopped 04/21/13 1644)  HYDROmorphone (DILAUDID) injection 0.5 mg (0.5 mg Intravenous Given 04/21/13 1609)  Tdap (BOOSTRIX)  injection 0.5 mL (0.5 mLs Intramuscular Given 04/21/13 1608)  HYDROmorphone (DILAUDID) injection 0.5 mg (0.5 mg Intravenous Given 04/21/13 1706)  HYDROmorphone (DILAUDID) injection 0.5 mg (0.5 mg Intravenous Given 04/21/13 1835)  HYDROmorphone (DILAUDID) injection 0.5 mg (0.5 mg Intravenous Given 04/21/13 2036)  clindamycin (CLEOCIN) IVPB 600 mg (600 mg Intravenous Given 04/21/13 2205)        Blanchard Kelch, MD 04/21/13 2238

## 2013-04-21 NOTE — ED Notes (Signed)
Pt from home via [facility] with c/o left knee pain, right wrist pain, and a 1 in laceration her right cheek bone s/p slipping on a wood floor and falling.   Given 150 mcg fentanyl.  No LOC, neck or back pain.  Not on blood thinners.  Pt in NAD, A&O.

## 2013-04-21 NOTE — ED Notes (Signed)
Dermabond placed at bedside. 

## 2013-04-21 NOTE — ED Notes (Signed)
Personal valuables were locked up in security office and medications sent to main pharmacy

## 2013-04-22 ENCOUNTER — Encounter (HOSPITAL_COMMUNITY): Payer: Self-pay | Admitting: General Practice

## 2013-04-22 DIAGNOSIS — W19XXXA Unspecified fall, initial encounter: Secondary | ICD-10-CM

## 2013-04-22 DIAGNOSIS — S82009A Unspecified fracture of unspecified patella, initial encounter for closed fracture: Secondary | ICD-10-CM

## 2013-04-22 LAB — BASIC METABOLIC PANEL
BUN: 14 mg/dL (ref 6–23)
CALCIUM: 9.2 mg/dL (ref 8.4–10.5)
CO2: 20 mEq/L (ref 19–32)
CREATININE: 0.85 mg/dL (ref 0.50–1.10)
Chloride: 104 mEq/L (ref 96–112)
GFR calc Af Amer: 84 mL/min — ABNORMAL LOW (ref >90)
GFR, EST NON AFRICAN AMERICAN: 72 mL/min — AB (ref >90)
Glucose, Bld: 162 mg/dL — ABNORMAL HIGH (ref 70–99)
Potassium: 5.3 mEq/L (ref 3.7–5.3)
SODIUM: 139 meq/L (ref 137–147)

## 2013-04-22 LAB — CBC
HCT: 37 % (ref 36.0–46.0)
Hemoglobin: 11.9 g/dL — ABNORMAL LOW (ref 12.0–15.0)
MCH: 27.7 pg (ref 26.0–34.0)
MCHC: 32.2 g/dL (ref 30.0–36.0)
MCV: 86 fL (ref 78.0–100.0)
PLATELETS: 483 10*3/uL — AB (ref 150–400)
RBC: 4.3 MIL/uL (ref 3.87–5.11)
RDW: 15.6 % — AB (ref 11.5–15.5)
WBC: 19.4 10*3/uL — AB (ref 4.0–10.5)

## 2013-04-22 LAB — GLUCOSE, CAPILLARY
GLUCOSE-CAPILLARY: 279 mg/dL — AB (ref 70–99)
Glucose-Capillary: 124 mg/dL — ABNORMAL HIGH (ref 70–99)
Glucose-Capillary: 183 mg/dL — ABNORMAL HIGH (ref 70–99)
Glucose-Capillary: 267 mg/dL — ABNORMAL HIGH (ref 70–99)
Glucose-Capillary: 202 mg/dL — ABNORMAL HIGH (ref 70–99)

## 2013-04-22 LAB — HEMOGLOBIN A1C
HEMOGLOBIN A1C: 6.9 % — AB (ref <5.7)
Mean Plasma Glucose: 151 mg/dL — ABNORMAL HIGH (ref <117)

## 2013-04-22 MED ORDER — HYDROMORPHONE HCL PF 1 MG/ML IJ SOLN
1.0000 mg | INTRAMUSCULAR | Status: DC | PRN
  Administered 2013-04-22 – 2013-04-26 (×11): 1 mg via INTRAVENOUS
  Filled 2013-04-22 (×11): qty 1

## 2013-04-22 MED ORDER — HYDROMORPHONE HCL PF 1 MG/ML IJ SOLN
INTRAMUSCULAR | Status: AC
  Filled 2013-04-22: qty 1

## 2013-04-22 MED ORDER — METOCLOPRAMIDE HCL 5 MG/ML IJ SOLN: 5.0000 mg | Freq: Three times a day (TID) | INTRAMUSCULAR | Status: DC | PRN

## 2013-04-22 MED ORDER — METOCLOPRAMIDE HCL 10 MG PO TABS
10.0000 mg | ORAL_TABLET | Freq: Three times a day (TID) | ORAL | Status: DC
  Administered 2013-04-22 – 2013-04-26 (×15): 10 mg via ORAL
  Filled 2013-04-22 (×17): qty 1

## 2013-04-22 MED ORDER — HEPARIN SODIUM (PORCINE) 5000 UNIT/ML IJ SOLN
5000.0000 [IU] | Freq: Three times a day (TID) | INTRAMUSCULAR | Status: DC
  Administered 2013-04-22 – 2013-04-26 (×13): 5000 [IU] via SUBCUTANEOUS
  Filled 2013-04-22 (×18): qty 1

## 2013-04-22 MED ORDER — ACETAMINOPHEN 650 MG RE SUPP: 650.0000 mg | Freq: Four times a day (QID) | RECTAL | Status: DC | PRN

## 2013-04-22 MED ORDER — ACETAMINOPHEN 325 MG PO TABS: 650.0000 mg | ORAL_TABLET | Freq: Four times a day (QID) | ORAL | Status: DC | PRN

## 2013-04-22 MED ORDER — INSULIN ASPART 100 UNIT/ML ~~LOC~~ SOLN
0.0000 [IU] | Freq: Three times a day (TID) | SUBCUTANEOUS | Status: DC
  Administered 2013-04-22: 3 [IU] via SUBCUTANEOUS
  Administered 2013-04-22 (×2): 8 [IU] via SUBCUTANEOUS
  Administered 2013-04-23: 2 [IU] via SUBCUTANEOUS
  Administered 2013-04-23 (×2): 3 [IU] via SUBCUTANEOUS
  Administered 2013-04-24: 2 [IU] via SUBCUTANEOUS
  Administered 2013-04-24 – 2013-04-25 (×3): 3 [IU] via SUBCUTANEOUS
  Administered 2013-04-25: 2 [IU] via SUBCUTANEOUS
  Administered 2013-04-25: 3 [IU] via SUBCUTANEOUS
  Administered 2013-04-26: 2 [IU] via SUBCUTANEOUS
  Administered 2013-04-26: 5 [IU] via SUBCUTANEOUS
  Administered 2013-04-26: 14:00:00 via SUBCUTANEOUS

## 2013-04-22 MED ORDER — PANTOPRAZOLE SODIUM 40 MG PO TBEC
80.0000 mg | DELAYED_RELEASE_TABLET | Freq: Every day | ORAL | Status: DC
  Administered 2013-04-22 – 2013-04-26 (×5): 80 mg via ORAL
  Filled 2013-04-22 (×5): qty 2

## 2013-04-22 MED ORDER — ASPIRIN 325 MG PO TABS
325.0000 mg | ORAL_TABLET | Freq: Every day | ORAL | Status: DC
  Administered 2013-04-22 – 2013-04-26 (×5): 325 mg via ORAL
  Filled 2013-04-22 (×5): qty 1

## 2013-04-22 MED ORDER — MOMETASONE FURO-FORMOTEROL FUM 100-5 MCG/ACT IN AERO
2.0000 | INHALATION_SPRAY | Freq: Two times a day (BID) | RESPIRATORY_TRACT | Status: DC
Start: 2013-04-22 — End: 2013-04-26
  Administered 2013-04-22 – 2013-04-26 (×9): 2 via RESPIRATORY_TRACT
  Filled 2013-04-22: qty 8.8

## 2013-04-22 MED ORDER — HYDROMORPHONE HCL PF 1 MG/ML IJ SOLN
0.2500 mg | INTRAMUSCULAR | Status: DC | PRN
  Administered 2013-04-22 (×2): 0.5 mg via INTRAVENOUS

## 2013-04-22 MED ORDER — OXYCODONE HCL 5 MG PO TABS
20.0000 mg | ORAL_TABLET | ORAL | Status: DC | PRN
  Administered 2013-04-22 – 2013-04-26 (×31): 20 mg via ORAL
  Filled 2013-04-22 (×31): qty 4

## 2013-04-22 MED ORDER — INSULIN GLARGINE 100 UNIT/ML ~~LOC~~ SOLN
30.0000 [IU] | Freq: Two times a day (BID) | SUBCUTANEOUS | Status: DC
  Administered 2013-04-22 – 2013-04-23 (×4): 30 [IU] via SUBCUTANEOUS
  Filled 2013-04-22 (×8): qty 0.3

## 2013-04-22 MED ORDER — METOCLOPRAMIDE HCL 5 MG PO TABS: 5.0000 mg | ORAL_TABLET | Freq: Three times a day (TID) | ORAL | Status: DC | PRN

## 2013-04-22 MED ORDER — INSULIN ASPART 100 UNIT/ML ~~LOC~~ SOLN
0.0000 [IU] | Freq: Every day | SUBCUTANEOUS | Status: DC
Start: 2013-04-22 — End: 2013-04-26
  Administered 2013-04-22 – 2013-04-23 (×2): 2 [IU] via SUBCUTANEOUS

## 2013-04-22 MED ORDER — DIAZEPAM 5 MG PO TABS
5.0000 mg | ORAL_TABLET | Freq: Three times a day (TID) | ORAL | Status: DC | PRN
  Administered 2013-04-22 – 2013-04-25 (×4): 5 mg via ORAL
  Filled 2013-04-22 (×4): qty 1

## 2013-04-22 MED ORDER — TRAZODONE HCL 100 MG PO TABS
200.0000 mg | ORAL_TABLET | Freq: Every day | ORAL | Status: DC
  Administered 2013-04-22 – 2013-04-25 (×5): 200 mg via ORAL
  Filled 2013-04-22 (×6): qty 2

## 2013-04-22 MED ORDER — CLINDAMYCIN PHOSPHATE 600 MG/50ML IV SOLN
600.0000 mg | INTRAVENOUS | Status: DC
  Filled 2013-04-22: qty 50

## 2013-04-22 MED ORDER — CHLORHEXIDINE GLUCONATE 4 % EX LIQD
60.0000 mL | Freq: Once | CUTANEOUS | Status: DC
  Filled 2013-04-22: qty 60

## 2013-04-22 MED ORDER — DICYCLOMINE HCL 10 MG PO CAPS
10.0000 mg | ORAL_CAPSULE | Freq: Three times a day (TID) | ORAL | Status: DC
  Administered 2013-04-22 – 2013-04-26 (×15): 10 mg via ORAL
  Filled 2013-04-22 (×16): qty 1

## 2013-04-22 MED ORDER — FOLIC ACID 1 MG PO TABS
1.0000 mg | ORAL_TABLET | Freq: Every evening | ORAL | Status: DC
  Administered 2013-04-22 – 2013-04-26 (×5): 1 mg via ORAL
  Filled 2013-04-22 (×5): qty 1

## 2013-04-22 MED ORDER — ONDANSETRON HCL 4 MG PO TABS: 4.0000 mg | ORAL_TABLET | Freq: Four times a day (QID) | ORAL | Status: DC | PRN

## 2013-04-22 MED ORDER — CLINDAMYCIN PHOSPHATE 600 MG/50ML IV SOLN
600.0000 mg | Freq: Four times a day (QID) | INTRAVENOUS | Status: AC
  Administered 2013-04-22 (×3): 600 mg via INTRAVENOUS
  Filled 2013-04-22 (×3): qty 50

## 2013-04-22 MED ORDER — IPRATROPIUM-ALBUTEROL 0.5-2.5 (3) MG/3ML IN SOLN: 3.0000 mL | RESPIRATORY_TRACT | Status: DC | PRN

## 2013-04-22 MED ORDER — HYDROMORPHONE HCL PF 1 MG/ML IJ SOLN
0.2500 mg | INTRAMUSCULAR | Status: DC | PRN
  Administered 2013-04-22 (×4): 0.5 mg via INTRAVENOUS

## 2013-04-22 MED ORDER — ONDANSETRON HCL 4 MG/2ML IJ SOLN: 4.0000 mg | Freq: Four times a day (QID) | INTRAMUSCULAR | Status: DC | PRN

## 2013-04-22 MED ORDER — RIFAXIMIN 550 MG PO TABS
550.0000 mg | ORAL_TABLET | Freq: Two times a day (BID) | ORAL | Status: DC
  Administered 2013-04-22 – 2013-04-26 (×9): 550 mg via ORAL
  Filled 2013-04-22 (×11): qty 1

## 2013-04-22 MED ORDER — PROMETHAZINE HCL 25 MG PO TABS: 25.0000 mg | ORAL_TABLET | Freq: Four times a day (QID) | ORAL | Status: DC | PRN

## 2013-04-22 NOTE — Progress Notes (Signed)
Orthopedics Progress Note  Subjective: Patient complains of left knee pain after surgery. Asking for stronger medicine.  Objective:  Filed Vitals:   04/22/13 0615  BP: 150/48  Pulse: 56  Temp: 99.1 F (37.3 C)  Resp: 16    General: Awake and alert  Musculoskeletal: Left knee immobilized and dressed, left foot well perfused and warm. Able to wiggle the toes and ankle with some pain at the knee Neurovascularly intact  Lab Results  Component Value Date   WBC 19.4* 04/22/2013   HGB 11.9* 04/22/2013   HCT 37.0 04/22/2013   MCV 86.0 04/22/2013   PLT 483* 04/22/2013       Component Value Date/Time   NA 139 04/22/2013 0544   NA 140 04/13/2013 1447   K 5.3 04/22/2013 0544   K 4.7 04/13/2013 1447   CL 104 04/22/2013 0544   CL 103 03/23/2012 0938   CO2 20 04/22/2013 0544   CO2 22 04/13/2013 1447   GLUCOSE 162* 04/22/2013 0544   GLUCOSE 121 04/13/2013 1447   GLUCOSE 319* 03/23/2012 0938   BUN 14 04/22/2013 0544   BUN 15.9 04/13/2013 1447   CREATININE 0.85 04/22/2013 0544   CREATININE 1.0 04/13/2013 1447   CALCIUM 9.2 04/22/2013 0544   CALCIUM 10.1 04/13/2013 1447   GFRNONAA 72* 04/22/2013 0544   GFRAA 84* 04/22/2013 0544    Lab Results  Component Value Date   INR 1.00 04/21/2013   INR 1.29 02/27/2013   INR 1.28 02/26/2013    Assessment/Plan: POD #1 s/p ORIF patella Patient in a fair amount of pain.  Will keep immobilizer in place only loosening for quad sets with PT and dressing changes. Mobilization WBAT with brace on. Will need SNF.  Spoke to daughter last night who agrees with that plan.  Doran Heater. Veverly Fells, MD 04/22/2013 7:23 AM

## 2013-04-22 NOTE — Consult Note (Signed)
Physical Medicine and Rehabilitation Consult Reason for Consult: Displaced left patella fracture Referring Physician: [facility] right-handed   HPI: [name] is a 63 y.o. 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 Admitted 04/21/2013 after mechanical fall without loss of consciousness. X-rays and imaging revealed comminuted left patella fracture. Underwent open reduction internal fixation left patella 04/22/2013 per Dr. Veverly Fells. Postoperative pain management. Weightbearing as tolerated with knee immobilizer at all times no range of motion to left knee. Subcutaneous heparin for DVT prophylaxis. Physical therapy evaluation completed 04/22/2013 with recommendations for physical medicine rehabilitation consult.  Patient complains of severe pain in the left knee even in bed. Pre-medication pain 9/10 post medication pain 7/10, states that doing a standing pivot transfer with PT was very painful today  Review of Systems  Cardiovascular: Positive for leg swelling.  Gastrointestinal:       Irritable bowel syndrome GERD  Genitourinary:       Overactive bladder  Musculoskeletal: Positive for myalgias.  Neurological: Positive for headaches.  Psychiatric/Behavioral: Positive for depression.       Anxiety  All other systems reviewed and are negative.   Past Medical History  Diagnosis Date  . Cirrhosis   . GERD (gastroesophageal reflux disease)   . Cervical disc syndrome   . Lung cancer     squamous cell  . Chronic respiratory failure   . Diverticulitis of colon   . Perforation of colon   . Arthritis   . IBS (irritable bowel syndrome)   . Chronic lower GI bleeding   . Overactive bladder   . Cervical cancer   . Thrombocytopenia     sees Dr. Julien Nordmann   . Splenomegaly   .  Anxiety   . Depression   . Hypertension   . Asthma   . Chronic headache disorder   . Blood transfusion without reported diagnosis   . Heart murmur   . Hyperlipidemia   . Lung cancer   . COPD (chronic obstructive pulmonary disease)     sees Dr. Gwenette Greet   . Anemia     sees Dr. Julien Nordmann, due to chronic disease and GI losses   . Diabetes mellitus     sees Dr. Cruzita Lederer   . Colostomy care   . Hypercalcemia    Past Surgical History  Procedure Laterality Date  . Appendectomy    . Cholecystectomy    . Colostomy    . Pneumonectomy    . Cervix removed    . Bowel resection    . Esophagogastroduodenoscopy  03/19/2011    Procedure: ESOPHAGOGASTRODUODENOSCOPY (EGD);  Surgeon: [name], MD;  Location: [facility];  Service: Endoscopy;  Laterality: N/A;  . Colonoscopy  03/19/2011    Procedure: COLONOSCOPY;  Surgeon: [name], MD;  Location: [facility];  Service: Endoscopy;  Laterality: N/A;  . Givens capsule study  03/20/2011    Procedure: GIVENS CAPSULE STUDY;  Surgeon: [name], MD;  Location: [facility];  Service: Endoscopy;  Laterality: N/A;  . Small bowel obstruction repair  March 2012  . Umbilical hernia repair  March 2012  . Splenectomy, total N/A 02/24/2013    Procedure: SPLENECTOMY;  Surgeon: Harl Bowie, MD;  Location: [facility];  Service: General;  Laterality: N/A;  . Orif patella fracture Left 04/21/2013    DR  VANN   Family History  Problem Relation Age of Onset  . Coronary artery disease    . Diabetes type II    . Anesthesia problems Neg Hx   . Hypotension Neg Hx   . Malignant hyperthermia Neg Hx   . Pseudochol deficiency Neg Hx   . Heart attack Mother   . Diabetes type II Mother   . Cirrhosis Father    Social History:  reports that she quit smoking about 11 years ago. Her smoking use included Cigarettes. She has a 70 pack-year smoking history. She has never used smokeless tobacco. She reports that she does not drink alcohol or use illicit  drugs. Allergies:  Allergies  Allergen Reactions  . Penicillins Anaphylaxis and Rash  . Codeine Phosphate Other (See Comments)    REACTION: Stomach cramps  . Hydrocodone-Acetaminophen Other (See Comments)    REACTION: hallucinations  . Morphine Other (See Comments)    REACTION: Lowers BP  . Other Other (See Comments)    AGENT:  Per pt, cannot take blood thinners due to cirrhosis of the liver  . Cephalexin Swelling and Rash   Medications Prior to Admission  Medication Sig Dispense Refill  . albuterol-ipratropium (COMBIVENT) 18-103 MCG/ACT inhaler Inhale 2 puffs into the lungs every 4 (four) hours as needed for wheezing or shortness of breath.       [name] aspirin 325 MG tablet Take 1 tablet (325 mg total) by mouth daily.  30 tablet  0  . diazepam (VALIUM) 5 MG tablet Take 5 mg by mouth 3 (three) times daily as needed for anxiety.      . dicyclomine (BENTYL) 10 MG capsule TAKE ONE CAPSULE BY MOUTH THREE TIMES DAILY  270 capsule  3  . esomeprazole (NEXIUM) 40 MG capsule Take 40 mg by mouth every evening.      . Fluticasone-Salmeterol (ADVAIR) 250-50 MCG/DOSE AEPB Inhale 1 puff into the lungs every 12 (twelve) hours.  161 each  3  . folic acid (FOLVITE) 1 MG tablet Take 1 tablet (1 mg total) by mouth every evening.  90 tablet  3  . furosemide (LASIX) 20 MG tablet Take 20-40 mg by mouth daily as needed for fluid.      [name] insulin glargine (LANTUS) 100 UNIT/ML injection Inject 0.6 mLs (60 Units total) into the skin 2 (two) times daily.  30 mL  12  . insulin lispro (HUMALOG) 100 UNIT/ML injection Inject 35-47 Units into the skin 3 (three) times daily before meals. Injects 47 units daily with breakfast, 35 units daily with lunch, and 45 units daily with supper.  40 mL  4  . metFORMIN (GLUCOPHAGE) 1000 MG tablet Take 1 tablet (1,000 mg total) by mouth 2 (two) times daily with a meal.  60 tablet  1  . metoCLOPramide (REGLAN) 10 MG tablet Take 1 tablet (10 mg total) by mouth 3 (three) times daily.  270  tablet  3  . Oxycodone HCl 20 MG TABS Take 1 tablet (20 mg total) by mouth every 3 (three) hours as needed (pain).  240 tablet  0  . potassium chloride (MICRO-K) 10 MEQ CR capsule Take 1 capsule (10 mEq total) by mouth daily.  90 capsule  3  . promethazine (PHENERGAN) 25 MG tablet Take 25 mg by mouth every 6 (six) hours as needed for nausea. For nausea      . rifaximin (XIFAXAN) 550 MG TABS tablet Take 550 mg by mouth 2 (two) times daily.      [name]  traZODone (DESYREL) 100 MG tablet Take 2 tablets (200 mg total) by mouth at bedtime.  180 tablet  3  . [EXPIRED] HYDROmorphone (DILAUDID) 4 MG tablet Take 2-8 mg by mouth every 6 (six) hours as needed for moderate pain.      . Insulin Pen Needle (BD PEN NEEDLE NANO U/F) 32G X 4 MM MISC Use 4 times daily as directed.  150 each  3  . Insulin Pen Needle (EASY TOUCH PEN NEEDLES) 32G X 4 MM MISC Use as directed, diagnosis code is 250.00  100 each  3    Home: Home Living Family/patient expects to be discharged to:: Private residence Living Arrangements: Children;Other relatives Available Help at Discharge: Available 24 hours/day (daughter-in-law) Type of Home: House Home Access: Stairs to enter [employer] of Steps: 3 Entrance Stairs-Rails: Right;Left (pt. reports they are "wobbly") Home Layout: One level Home Equipment: Cane - single point;Walker - 2 wheels  Functional History: Prior Function Level of Independence: Independent with assistive device(s) Functional Status:  Mobility: Bed Mobility Overal bed mobility: +2 for physical assistance;Needs Assistance Bed Mobility: Supine to Sit Supine to sit: +2 for physical assistance;Mod assist General bed mobility comments: cues for technique and assist for LEs and trunk Transfers Overall transfer level: Needs assistance Equipment used: Rolling walker (2 wheeled) Transfers: Sit to/from Stand Sit to Stand: +2 physical assistance;Mod assist General transfer comment: assist to rise to stand  and for safety and stability Ambulation/Gait Ambulation/Gait assistance: +2 physical assistance;Mod assist Ambulation Distance (Feet): 2 Feet Assistive device: Rolling walker (2 wheeled) Gait Pattern/deviations: Step-to pattern Gait velocity: decreased General Gait Details: pt. with difficulty moving her L LE due to surgery and use of KI, needed 2 assist for safety and stability    ADL:    Cognition: Cognition Overall Cognitive Status: Within Functional Limits for tasks assessed Orientation Level: Oriented X4 Cognition Arousal/Alertness: Awake/alert Behavior During Therapy: WFL for tasks assessed/performed Overall Cognitive Status: Within Functional Limits for tasks assessed  Blood pressure 151/60, pulse 84, temperature 97.7 F (36.5 C), temperature source Oral, resp. rate 16, height 5' (1.524 m), weight 81.9 kg (180 lb 8.9 oz), SpO2 96.00%. Physical Exam  Constitutional: She is oriented to person, place, and time.  HENT:  Head: Normocephalic.  Eyes: EOM are normal.  Neck: Normal range of motion. Neck supple. No thyromegaly present.  Cardiovascular: Normal rate and regular rhythm.   Respiratory: Effort normal and breath sounds normal. No respiratory distress.  GI: Soft. Bowel sounds are normal. She exhibits no distension.  Colostomy tube in place  Neurological: She is alert and oriented to person, place, and time.  Skin:  Knee immobilizer left lower extremity   motor strength 5/5 in bilateral deltoid, bicep, tricep, grip, 4/5 in the right hip flexor, knee extensors, ankle dorsiflexor plantar flexor Left hip flexor not tested secondary to pain left ankle dorsiflexor plantar flexor 3 minus secondary to pain Knee immobilizer in place Extremity no evidence of calf edema on the right side  Results for orders placed during the hospital encounter of 04/21/13 (from the past 24 hour(s))  CBC WITH DIFFERENTIAL     Status: Abnormal   Collection Time    04/21/13  3:55 PM       Result Value Ref Range   WBC 17.5 (*) 4.0 - 10.5 K/uL   RBC 4.48  3.87 - 5.11 MIL/uL   Hemoglobin 12.6  12.0 - 15.0 g/dL   HCT 38.3  36.0 - 46.0 %   MCV 85.5  78.0 -  100.0 fL   MCH 28.1  26.0 - 34.0 pg   MCHC 32.9  30.0 - 36.0 g/dL   RDW 15.2  11.5 - 15.5 %   Platelets 529 (*) 150 - 400 K/uL   Neutrophils Relative % 49  43 - 77 %   Neutro Abs 8.5 (*) 1.7 - 7.7 K/uL   Lymphocytes Relative 25  12 - 46 %   Lymphs Abs 4.4 (*) 0.7 - 4.0 K/uL   Monocytes Relative 16 (*) 3 - 12 %   Monocytes Absolute 2.8 (*) 0.1 - 1.0 K/uL   Eosinophils Relative 9 (*) 0 - 5 %   Eosinophils Absolute 1.6 (*) 0.0 - 0.7 K/uL   Basophils Relative 1  0 - 1 %   Basophils Absolute 0.2 (*) 0.0 - 0.1 K/uL  COMPREHENSIVE METABOLIC PANEL     Status: Abnormal   Collection Time    04/21/13  3:55 PM      Result Value Ref Range   Sodium 138  137 - 147 mEq/L   Potassium 5.4 (*) 3.7 - 5.3 mEq/L   Chloride 100  96 - 112 mEq/L   CO2 25  19 - 32 mEq/L   Glucose, Bld 181 (*) 70 - 99 mg/dL   BUN 14  6 - 23 mg/dL   Creatinine, Ser 0.79  0.50 - 1.10 mg/dL   Calcium 10.3  8.4 - 10.5 mg/dL   Total Protein 7.7  6.0 - 8.3 g/dL   Albumin 3.2 (*) 3.5 - 5.2 g/dL   AST 69 (*) 0 - 37 U/L   ALT 54 (*) 0 - 35 U/L   Alkaline Phosphatase 153 (*) 39 - 117 U/L   Total Bilirubin 0.2 (*) 0.3 - 1.2 mg/dL   GFR calc non Af Amer 88 (*) >90 mL/min   GFR calc Af Amer >90  >90 mL/min  URINALYSIS, ROUTINE W REFLEX MICROSCOPIC     Status: Abnormal   Collection Time    04/21/13  4:33 PM      Result Value Ref Range   Color, Urine YELLOW  YELLOW   APPearance CLEAR  CLEAR   Specific Gravity, Urine 1.010  1.005 - 1.030   pH 6.0  5.0 - 8.0   Glucose, UA NEGATIVE  NEGATIVE mg/dL   Hgb urine dipstick NEGATIVE  NEGATIVE   Bilirubin Urine NEGATIVE  NEGATIVE   Ketones, ur NEGATIVE  NEGATIVE mg/dL   Protein, ur 30 (*) NEGATIVE mg/dL   Urobilinogen, UA 0.2  0.0 - 1.0 mg/dL   Nitrite NEGATIVE  NEGATIVE   Leukocytes, UA TRACE (*) NEGATIVE  URINE  MICROSCOPIC-ADD ON     Status: None   Collection Time    04/21/13  4:33 PM      Result Value Ref Range   Squamous Epithelial / LPF RARE  RARE   WBC, UA 7-10  <3 WBC/hpf   Bacteria, UA RARE  RARE  PROTIME-INR     Status: None   Collection Time    04/21/13  7:46 PM      Result Value Ref Range   Prothrombin Time 13.0  11.6 - 15.2 seconds   INR 1.00  0.00 - 1.49  GLUCOSE, CAPILLARY     Status: Abnormal   Collection Time    04/21/13 11:32 PM      Result Value Ref Range   Glucose-Capillary 127 (*) 70 - 99 mg/dL  GLUCOSE, CAPILLARY     Status: Abnormal   Collection Time  04/22/13  2:29 AM      Result Value Ref Range   Glucose-Capillary 124 (*) 70 - 99 mg/dL   Comment 1 Documented in Chart    BASIC METABOLIC PANEL     Status: Abnormal   Collection Time    04/22/13  5:44 AM      Result Value Ref Range   Sodium 139  137 - 147 mEq/L   Potassium 5.3  3.7 - 5.3 mEq/L   Chloride 104  96 - 112 mEq/L   CO2 20  19 - 32 mEq/L   Glucose, Bld 162 (*) 70 - 99 mg/dL   BUN 14  6 - 23 mg/dL   Creatinine, Ser 0.85  0.50 - 1.10 mg/dL   Calcium 9.2  8.4 - 10.5 mg/dL   GFR calc non Af Amer 72 (*) >90 mL/min   GFR calc Af Amer 84 (*) >90 mL/min  CBC     Status: Abnormal   Collection Time    04/22/13  5:44 AM      Result Value Ref Range   WBC 19.4 (*) 4.0 - 10.5 K/uL   RBC 4.30  3.87 - 5.11 MIL/uL   Hemoglobin 11.9 (*) 12.0 - 15.0 g/dL   HCT 37.0  36.0 - 46.0 %   MCV 86.0  78.0 - 100.0 fL   MCH 27.7  26.0 - 34.0 pg   MCHC 32.2  30.0 - 36.0 g/dL   RDW 15.6 (*) 11.5 - 15.5 %   Platelets 483 (*) 150 - 400 K/uL  HEMOGLOBIN A1C     Status: Abnormal   Collection Time    04/22/13  5:44 AM      Result Value Ref Range   Hemoglobin A1C 6.9 (*) <5.7 %   Mean Plasma Glucose 151 (*) <117 mg/dL  GLUCOSE, CAPILLARY     Status: Abnormal   Collection Time    04/22/13  7:45 AM      Result Value Ref Range   Glucose-Capillary 183 (*) 70 - 99 mg/dL  GLUCOSE, CAPILLARY     Status: Abnormal   Collection  Time    04/22/13 12:22 PM      Result Value Ref Range   Glucose-Capillary 267 (*) 70 - 99 mg/dL   Dg Chest 2 View  04/21/2013   CLINICAL DATA:  Fall.  Bilateral chest wall pain.  EXAM: CHEST  2 VIEW  COMPARISON:  To 03/2013  FINDINGS: Heart is mildly enlarged. No confluent airspace opacities in the lungs. Mild vascular congestion. No overt edema. No effusions or acute bony abnormality.  No visible rib fracture or pneumothorax.  IMPRESSION: Mild cardiomegaly, vascular congestion.   Electronically Signed   By: Rolm Baptise M.D.   On: 04/21/2013 17:58   Dg Pelvis 1-2 Views  04/21/2013   CLINICAL DATA:  Fall.  Pain.  EXAM: PELVIS - 1-2 VIEW  COMPARISON:  None.  FINDINGS: No acute bony abnormality. Specifically, no fracture, subluxation, or dislocation. Soft tissues are intact. SI joints and hip joints are symmetric.  IMPRESSION: No acute bony abnormality.   Electronically Signed   By: Rolm Baptise M.D.   On: 04/21/2013 18:04   Dg Knee 1-2 Views Left  04/22/2013   CLINICAL DATA:  ORIF of left patella fracture  EXAM: LEFT KNEE - 1-2 VIEW  COMPARISON:  DG KNEE COMPLETE 4 VIEWS*L* dated 04/21/2013  FINDINGS: 2 spot intraoperative fluoroscopic images of the left knee are provided for review.  Images demonstrate the sequela of a  lag screw and cerclage wire fixation of previously noted displaced mid pole patellar fracture. Alignment appears improved with persistent minimal articular surface irregularity. Minimal amount of expected adjacent soft tissue swelling. No radiopaque foreign body.  IMPRESSION: Post ORIF of the patella without evidence of complication.   Electronically Signed   By: Sandi Mariscal M.D.   On: 04/22/2013 07:37   Ct Head Wo Contrast  04/21/2013   CLINICAL DATA:  Recent traumatic injury and pain  EXAM: CT HEAD WITHOUT CONTRAST  TECHNIQUE: Contiguous axial images were obtained from the base of the skull through the vertex without intravenous contrast.  COMPARISON:  None.  FINDINGS: The bony  calvarium is intact. No gross soft tissue abnormality is noted. Mild atrophic changes are seen. No findings to suggest acute hemorrhage, acute infarction or space-occupying mass lesion are noted.  IMPRESSION: Chronic changes without acute abnormality.   Electronically Signed   By: Inez Catalina M.D.   On: 04/21/2013 18:11   Dg Knee Complete 4 Views Left  04/21/2013   CLINICAL DATA:  [place], pain.  EXAM: LEFT KNEE - COMPLETE 4+ VIEW  COMPARISON:  None.  FINDINGS: There is a comminuted transverse patellar fracture with displacement of the proximal and distal fragments. Relatively dense knee effusion likely representing hemarthrosis. Distal femur and proximal tibia intact. Mild joint space narrowing.  IMPRESSION: Transverse comminuted patellar fracture.  Probable hemarthrosis.   Electronically Signed   By: Rolla Flatten M.D.   On: 04/21/2013 15:27    Assessment/Plan: Diagnosis: Left patellar fracture secondary to fall 04/21/2013 1. Does the need for close, 24 hr/day medical supervision in concert with the patient's rehab needs make it unreasonable for this patient to be served in a less intensive setting? Potentially 2. 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 Due to bladder management, bowel management, safety, skin/wound care, disease management, medication administration, pain management and patient education, does the patient require 24 hr/day rehab nursing? Potentially 4. Does the patient require coordinated care of a physician, rehab nurse, PT (1-2 hrs/day, 5 days/week) and OT (1-2 hrs/day, 5 days/week) to address physical and functional deficits in the context of the above medical diagnosis(es)? Potentially Addressing deficits in the following areas: balance, endurance, locomotion, strength, transferring, bowel/bladder control, bathing, dressing, feeding and  grooming 5. Can the patient actively participate in an intensive therapy program of at least 3 hrs of therapy per day at least 5 days per week? No 6. The potential for patient to make measurable gains while on inpatient rehab is Currently potential is poor however if pain control improves than would be able to participate 7. Anticipated functional outcomes upon discharge from inpatient rehab are n/a  with PT, n/a with OT, n/a with SLP. 8. Estimated rehab length of stay to reach the above functional goals is: NA 9. Does the patient have adequate social supports to accommodate these discharge functional goals? Potentially 10. Anticipated D/C setting: Home 11. Anticipated post D/C treatments: [place] therapy 12. Overall Rehab/Functional Prognosis: good  RECOMMENDATIONS: This patient's condition is appropriate for continued rehabilitative care in the following setting: CIR once patient is under better control Patient has agreed to participate in recommended program. Yes Note that insurance prior authorization may be required for reimbursement for recommended care.  Comment: Recommend timing pain medication, IV pain medicine just prior to PT, OT    04/22/2013

## 2013-04-22 NOTE — Progress Notes (Signed)
PROGRESS NOTE  [name] NUU:[id number] DOB: 1950/12/25 DOA: 04/21/2013 PCP: [name], MD  Assessment/Plan:  Comminuted left patellar fracture  -Secondary to mechanical fall  -s/p ORIF by Dr. 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4 F (36.9 C)  Resp: 16    Intake/Output Summary (Last 24 hours) at 04/22/13 1054 Last data filed at 04/22/13 0900  Gross per 24 hour  Intake   1220 ml  Output      0 ml  Net   1220 ml   Filed Weights   04/22/13 0705  Weight: 81.9 kg (180 lb 8.9 oz)    Exam:   General:  A+Ox3, NAD- no increased work of breathing  Cardiovascular: rrr  Respiratory: clear  Abdomen: colostomy in place  Musculoskeletal: moves all 4 ext   Data Reviewed: Basic Metabolic Panel:  Recent  Labs Lab 04/21/13 1555 04/22/13 0544  NA 138 139  K 5.4* 5.3  CL 100 104  CO2 25 20  GLUCOSE 181* 162*  BUN 14 14  CREATININE 0.79 0.85  CALCIUM 10.3 9.2   Liver Function Tests:  Recent Labs Lab 04/21/13 1555  AST 69*  ALT 54*  ALKPHOS 153*  BILITOT 0.2*  PROT 7.7  ALBUMIN 3.2*   No results found for this basename: LIPASE, AMYLASE,  in the last 168 hours No results found for this basename: AMMONIA,  in the last 168 hours CBC:  Recent Labs Lab 04/21/13 1555 04/22/13 0544  WBC 17.5* 19.4*  NEUTROABS 8.5*  --   HGB 12.6 11.9*  HCT 38.3 37.0  MCV 85.5 86.0  PLT 529* 483*   Cardiac Enzymes: No results found for this basename: CKTOTAL, CKMB, CKMBINDEX, TROPONINI,  in the last 168 hours BNP (last 3 results)  Recent Labs  11/13/12 1334  PROBNP 67.0   CBG:  Recent Labs Lab 04/21/13 2332 04/22/13 0229 04/22/13 0745  GLUCAP 127* 124* 183*    Recent Results (from the past 240 hour(s))  TECHNOLOGIST REVIEW     Status: None   Collection Time    04/13/13  2:47 PM      Result Value Ref Range Status   Technologist Review Variant lymphs present, Lg and Giant Plts   Final  Studies: Dg Chest 2 View  04/21/2013   CLINICAL DATA:  Fall.  Bilateral chest wall pain.  EXAM: CHEST  2 VIEW  COMPARISON:  To 03/2013  FINDINGS: Heart is mildly enlarged. No confluent airspace opacities in the lungs. Mild vascular congestion. No overt edema. No effusions or acute bony abnormality.  No visible rib fracture or pneumothorax.  IMPRESSION: Mild cardiomegaly, vascular congestion.   Electronically Signed   By: Rolm Baptise M.D.   On: 04/21/2013 17:58   Dg Pelvis 1-2 Views  04/21/2013   CLINICAL DATA:  Fall.  Pain.  EXAM: PELVIS - 1-2 VIEW  COMPARISON:  None.  FINDINGS: No acute bony abnormality. Specifically, no fracture, subluxation, or dislocation. Soft tissues are intact. SI joints and hip joints are symmetric.  IMPRESSION: No acute bony abnormality.   Electronically Signed    By: Rolm Baptise M.D.   On: 04/21/2013 18:04   Dg Knee 1-2 Views Left  04/22/2013   CLINICAL DATA:  ORIF of left patella fracture  EXAM: LEFT KNEE - 1-2 VIEW  COMPARISON:  DG KNEE COMPLETE 4 VIEWS*L* dated 04/21/2013  FINDINGS: 2 spot intraoperative fluoroscopic images of the left knee are provided for review.  Images demonstrate the sequela of a lag screw and cerclage wire fixation of previously noted displaced mid pole patellar fracture. Alignment appears improved with persistent minimal articular surface irregularity. Minimal amount of expected adjacent soft tissue swelling. No radiopaque foreign body.  IMPRESSION: Post ORIF of the patella without evidence of complication.   Electronically Signed   By: Sandi Mariscal M.D.   On: 04/22/2013 07:37   Ct Head Wo Contrast  04/21/2013   CLINICAL DATA:  Recent traumatic injury and pain  EXAM: CT HEAD WITHOUT CONTRAST  TECHNIQUE: Contiguous axial images were obtained from the base of the skull through the vertex without intravenous contrast.  COMPARISON:  None.  FINDINGS: The bony calvarium is intact. No gross soft tissue abnormality is noted. Mild atrophic changes are seen. No findings to suggest acute hemorrhage, acute infarction or space-occupying mass lesion are noted.  IMPRESSION: Chronic changes without acute abnormality.   Electronically Signed   By: Inez Catalina M.D.   On: 04/21/2013 18:11   Dg Knee Complete 4 Views Left  04/21/2013   CLINICAL DATA:  [place], pain.  EXAM: LEFT KNEE - COMPLETE 4+ VIEW  COMPARISON:  None.  FINDINGS: There is a comminuted transverse patellar fracture with displacement of the proximal and distal fragments. Relatively dense knee effusion likely representing hemarthrosis. Distal femur and proximal tibia intact. Mild joint space narrowing.  IMPRESSION: Transverse comminuted patellar fracture.  Probable hemarthrosis.   Electronically Signed   By: Rolla Flatten M.D.   On: 04/21/2013 15:27    Scheduled Meds: . aspirin  325 mg Oral  Daily  . clindamycin (CLEOCIN) IV  600 mg Intravenous Q6H  . dicyclomine  10 mg Oral TID AC  . folic acid  1 mg Oral QPM  . heparin  5,000 Units Subcutaneous 3 times per day  . HYDROmorphone      . HYDROmorphone      . HYDROmorphone      . HYDROmorphone      . HYDROmorphone      . insulin aspart  0-15 Units Subcutaneous TID WC  . insulin aspart  0-5 Units Subcutaneous QHS  . insulin glargine  30 Units Subcutaneous BID  . meperidine      . metoCLOPramide  10 mg Oral TID WC  . mometasone-formoterol  2 puff Inhalation BID  . ondansetron      . pantoprazole  80 mg Oral Q1200  . rifaximin  550 mg Oral BID  . traZODone  200 mg Oral QHS   Continuous Infusions:  Antibiotics Given (last 72 hours)   Date/Time Action Medication Dose Rate   04/21/13 2205 Given   [MAR Hold] clindamycin (CLEOCIN) IVPB 600 mg (On MAR Hold since 04/21/13 2149) 600 mg    04/22/13 4492 Given   clindamycin (CLEOCIN) IVPB 600 mg 600 mg 100 mL/hr   04/22/13 1016 Given   rifaximin (XIFAXAN) tablet 550 mg 550 mg    04/22/13 1017 Given   clindamycin (CLEOCIN) IVPB 600 mg 600 mg 100 mL/hr      Active Problems:   Type II or unspecified type diabetes mellitus with neurological manifestations, uncontrolled   DEPRESSION   COPD   Chronic respiratory failure   Cirrhosis of liver not due to alcohol   Patellar fracture   Left patella fracture    Time spent: 35 min    Koven Belinsky  [facility] Pager [phone]. If 7PM-7AM, please contact night-coverage at [email or web address], password [facility] 04/22/2013, 10:54 AM  LOS: 1 day

## 2013-04-22 NOTE — Evaluation (Signed)
Physical Therapy Evaluation Patient Details Name: [name] MRN: [id number] DOB: 08/24/1950 Today's Date: 04/22/2013   History of Present Illness  Pt. fell., sustaining a Left patellar fx.  She underwent ORIF of patella  Clinical Impression  Pt. Presents after a fall (she is unsure of the mechanism of the fall, just says "I was standing one minute and down the next).  She sustained a left patellar fracture and is s/p ORIF.  She is restricted to ankle pumps and quad sets ONLY and no ROM of knee.  She is currently having difficulty mobilizing and will benefit from acute PT to address her functional mobility and gait issues.  I am concerned that she may need some post acute therapy and feel she should be considered for a short IP rehab stay.  Have put in a screen request.  Will advance her as she tolerates.  She lives with son and daughter in law (4 children in the home) and reports daughter in law can provide some assist.     Follow Up Recommendations CIR    Equipment Recommendations  None recommended by PT    Recommendations for Other Services       Precautions / Restrictions Precautions Precautions: Fall Required Braces or Orthoses: Knee Immobilizer - Left Knee Immobilizer - Left: On at all times Restrictions Weight Bearing Restrictions: Yes LLE Weight Bearing: Weight bearing as tolerated Other Position/Activity Restrictions: NO ROM LEFT KNEE; quad sets and ankle pumps OK      Mobility  Bed Mobility Overal bed mobility: +2 for physical assistance;Needs Assistance Bed Mobility: Supine to Sit     Supine to sit: +2 for physical assistance;Mod assist     General bed mobility comments: cues for technique and assist for LEs and trunk  Transfers Overall transfer level: Needs assistance Equipment used: Rolling walker (2 wheeled) Transfers: Sit to/from Stand Sit to Stand: +2 physical assistance;Mod assist         General transfer comment: assist to rise to stand and  for safety and stability  Ambulation/Gait Ambulation/Gait assistance: +2 physical assistance;Mod assist Ambulation Distance (Feet): 2 Feet Assistive device: Rolling walker (2 wheeled) Gait Pattern/deviations: Step-to pattern Gait velocity: decreased   General Gait Details: pt. with difficulty moving her L LE due to surgery and use of KI, needed 2 assist for safety and stability  Stairs            Wheelchair Mobility    Modified Rankin (Stroke Patients Only)       Balance Overall balance assessment: Needs assistance Sitting-balance support: No upper extremity supported;Feet supported Sitting balance-Leahy Scale: Good     Standing balance support: Bilateral upper extremity supported;During functional activity Standing balance-Leahy Scale: Poor Standing balance comment: needs use of RW                     Pertinent Vitals/Pain See vitals tab     Home Living Family/patient expects to be discharged to:: Private residence Living Arrangements: Children;Other relatives Available Help at Discharge: Available 24 hours/day (daughter-in-law) Type of Home: House Home Access: Stairs to enter Entrance Stairs-Rails: Right;Left (pt. reports they are "wobbly") Entrance Stairs-Number of Steps: 3 Home Layout: One level Home Equipment: Cane - single point;Walker - 2 wheels      Prior Function Level of Independence: Independent with assistive device(s)               Hand Dominance   Dominant Hand: Right    Extremity/Trunk Assessment   Upper  Extremity Assessment: Overall WFL for tasks assessed           Lower Extremity Assessment: Overall WFL for tasks assessed;LLE deficits/detail   LLE Deficits / Details: not tested fully due to ROM restrictions, however pt. had fair quad set and good ankle pump  Cervical / Trunk Assessment: Normal  Communication   Communication: No difficulties  Cognition Arousal/Alertness: Awake/alert Behavior During Therapy: WFL  for tasks assessed/performed Overall Cognitive Status: Within Functional Limits for tasks assessed                      General Comments      Exercises General Exercises - Lower Extremity Ankle Circles/Pumps: AROM;Both;10 reps Quad Sets: AROM;Left;10 reps      Assessment/Plan    PT Assessment Patient needs continued PT services  PT Diagnosis Difficulty walking;Acute pain   PT Problem List Decreased activity tolerance;Decreased balance;Decreased mobility;Decreased knowledge of use of DME;Decreased knowledge of precautions;Pain  PT Treatment Interventions DME instruction;Gait training;Stair training;Functional mobility training;Therapeutic activities;Balance training;Patient/family education   PT Goals (Current goals can be found in the Care Plan section) Acute Rehab PT Goals Patient Stated Goal: wants to return home with daughter in law assisting PT Goal Formulation: With patient Time For Goal Achievement: 04/29/13 Potential to Achieve Goals: Good    Frequency Min 5X/week   Barriers to discharge        End of Session Equipment Utilized During Treatment: Left knee immobilizer Activity Tolerance: Patient tolerated treatment well Patient left: in chair;with call bell/phone within reach         Time: 0953-1013 PT Time Calculation (min): 20 min   Charges:   PT Evaluation $Initial PT Evaluation Tier I: 1 Procedure PT Treatments $Gait Training: 8-22 mins   PT G CodesLadona Ridgel 04/22/2013, 1:39 PM Gerlean Ren PT Acute Rehab Services [phone] [place] [phone]

## 2013-04-22 NOTE — Progress Notes (Signed)
Rehab Admissions Coordinator Note:  Patient was screened by Cleatrice Burke for appropriateness for an Inpatient Acute Rehab Consult per PT recommendation. At this time, we are recommending Inpatient Rehab consult. I will contact Dr. [name].  Cleatrice Burke 04/22/2013, 1:55 PM  I can be reached at [phone].

## 2013-04-22 NOTE — Progress Notes (Signed)
Pt requested that her purse that was in safe in security office be returned. Pt's purse which included her cell phone, wallet, and $10 returned to pt. Pt was advised of the valuables policy and was encouraged to send all items not needed home. Pt stated she will send her purse home this afternoon and will just keep her cell phone with her.

## 2013-04-22 NOTE — Op Note (Signed)
NAMEDELMA, DRONE NO.:  [id number]  MEDICAL RECORD NO.:  [id number]  LOCATION:  [id number]                        FACILITY:  [facility]  PHYSICIAN:  Doran Heater. Veverly Fells, M.D. DATE OF BIRTH:  01-05-1951  DATE OF PROCEDURE:  04/21/2013 DATE OF DISCHARGE:                              OPERATIVE REPORT   PREOPERATIVE DIAGNOSIS:  Displaced left patella fracture.  POSTOPERATIVE DIAGNOSIS:  Displaced left patella fracture.  PROCEDURE PERFORMED:  Open reduction and internal fixation, left patella fracture.  ATTENDING SURGEON:  Doran Heater. Veverly Fells, MD  ASSISTANT:  Danae Orleans, PA-C  ANESTHESIA:  General anesthesia was used.  ESTIMATED BLOOD LOSS:  Minimal.  FLUID REPLACEMENT:  1000 mL crystalloid.  INSTRUMENT COUNTS:  Correct.  COMPLICATIONS:  There were no complications.  ANTIBIOTICS:  Perioperative antibiotics were given.  We used clindamycin as she is pen allergic.  INDICATIONS:  The patient is a 63 year old female who suffered ground level fall while ambulating with a walker.  The patient complained of immediate severe left knee pain.  The patient was unable to stand due to pain in her knee.  The patient presented to [facility] Emergency Department where x-rays demonstrated displaced patella fracture.  I counseled the patient regarding the need to perform an open reduction, internal fixation of that patella fracture to restore continuity of her extensor mechanism.  The patient agreed, informed consent obtained.  DESCRIPTION OF PROCEDURE:  After adequate level of anesthesia was achieved, the patient was positioned in the supine position.  The left leg was correctly identified.  Nonsterile tourniquet placed on proximal thigh.  Left leg was then sterilely prepped and draped in usual manner. Time-out was called.  We draped the C-arm into the field.  We went ahead and made a midline skin incision after time-out had been called. Utilizing a 10 blade scalpel  dissection down through subcutaneous tissues using a Bovie identified the fractured patella.  I irrigated the fracture thoroughly, reduced the patella with a tenaculum clamp, and then went ahead and placed our guide pins for a 5.0 Biomet cannulated titanium screws across the fracture site gaining good purchase.  We verified our position on the AP and lateral views.  The patient had an interesting patellar morphology.  It did appear the fracture keyed in anatomically on the superficial surface of the patella.  During rotation of the patella in oblique planes, some of the images showed that the patella was really aligned well anatomically and there was just one persistency where there seemed to be a slight step-off although it looked cortically like it was anatomic on the dorsal surface.  I was able to range the patella and it was very smooth as it articulated up against the femur and we felt that based on the anatomic view AP and that the fact that we had some views where it appeared that the cortical undersurface bone was anatomically aligned.  We did have anatomic alignment.  I just felt completely well aligned while palpating around the patella.  The guide pins were measured for length.  We went ahead and placed a 38 screw and then a 40 screw, these were the Biomet 5.0 cannulated screws across the fracture  site again with good purchase.  We then placed 18-gauge stainless steel wire in a tension band technique through the cannulation of the screws and tightening those up which gave [place] nice dorsal compression.  We were pleased with the reduction.  We saw no gapping at all in the fracture site.  We thoroughly irrigated and closed in layers with 2-0 Vicryl subcutaneous closure and staples for the skin.  Sterile compressive bandage applied and knee immobilizer. The patient tolerated the procedure well.     Doran Heater. Veverly Fells, M.D.     SRN/MEDQ  D:  04/21/2013  T:  04/22/2013  Job:   [id number]

## 2013-04-23 DIAGNOSIS — M25569 Pain in unspecified knee: Secondary | ICD-10-CM

## 2013-04-23 DIAGNOSIS — J449 Chronic obstructive pulmonary disease, unspecified: Secondary | ICD-10-CM

## 2013-04-23 LAB — GLUCOSE, CAPILLARY
GLUCOSE-CAPILLARY: 188 mg/dL — AB (ref 70–99)
Glucose-Capillary: 138 mg/dL — ABNORMAL HIGH (ref 70–99)
Glucose-Capillary: 164 mg/dL — ABNORMAL HIGH (ref 70–99)

## 2013-04-23 NOTE — Progress Notes (Signed)
PROGRESS NOTE  [name] ITG:[id number] DOB: 1950/07/06 DOA: 04/21/2013 PCP: [name], MD  Assessment/Plan:  Comminuted left patellar fracture  -Secondary to mechanical fall  -s/p ORIF by Dr. 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   Chronic respiratory Failure with hx of L-upper lobectomy  -2L [place] at night  -stable   Leukocytosis  -chronic but improved since splenectomy  Hyperkalemia  -mild  -IVF  -d/c KCl supplement  Code Status: full Family Communication: patient Disposition Plan: CIR monday   Consultants:  ortho  Procedures:  ORIF 3/26    HPI/Subjective: Significant pain  Objective: Filed Vitals:   04/23/13 0516  BP: 115/60  Pulse: 91  Temp: 99.5 F (37.5 C)  Resp: 20    Intake/Output Summary (Last 24 hours) at 04/23/13 1143 Last data filed at 04/23/13 8309  Gross per 24 hour  Intake   1200 ml  Output   1300 ml  Net   -100 ml   Filed Weights   04/22/13 0705  Weight: 81.9 kg (180 lb 8.9 oz)    Exam:   General:  A+Ox3, NAD- no increased work of breathing  Cardiovascular: rrr  Respiratory: clear  Abdomen: colostomy in place  Musculoskeletal: moves all 4 ext   Data Reviewed: Basic Metabolic Panel:  Recent Labs Lab 04/21/13 1555 04/22/13 0544  NA 138 139  K 5.4* 5.3  CL 100 104  CO2 25 20  GLUCOSE 181* 162*  BUN 14 14  CREATININE 0.79  0.85  CALCIUM 10.3 9.2   Liver Function Tests:  Recent Labs Lab 04/21/13 1555  AST 69*  ALT 54*  ALKPHOS 153*  BILITOT 0.2*  PROT 7.7  ALBUMIN 3.2*   No results found for this basename: LIPASE, AMYLASE,  in the last 168 hours No results found for this basename: AMMONIA,  in the last 168 hours CBC:  Recent Labs Lab 04/21/13 1555 04/22/13 0544  WBC 17.5* 19.4*  NEUTROABS 8.5*  --   HGB 12.6 11.9*  HCT 38.3 37.0  MCV 85.5 86.0  PLT 529* 483*   Cardiac Enzymes: No results found for this basename: CKTOTAL, CKMB, CKMBINDEX, TROPONINI,  in the last 168 hours BNP (last 3 results)  Recent Labs  11/13/12 1334  PROBNP 67.0   CBG:  Recent Labs Lab 04/22/13 0745 04/22/13 1222 04/22/13 1657 04/22/13 2125 04/23/13 0741  GLUCAP 183* 267* 279* 202* 138*    Recent Results (from the past 240 hour(s))  TECHNOLOGIST REVIEW     Status: None   Collection Time    04/13/13  2:47 PM      Result Value Ref Range Status   Technologist Review Variant lymphs present, Lg and Giant Plts   Final     Studies: Dg Chest 2 View  04/21/2013   CLINICAL DATA:  Fall.  Bilateral chest wall pain.  EXAM: CHEST  2  VIEW  COMPARISON:  To 03/2013  FINDINGS: Heart is mildly enlarged. No confluent airspace opacities in the lungs. Mild vascular congestion. No overt edema. No effusions or acute bony abnormality.  No visible rib fracture or pneumothorax.  IMPRESSION: Mild cardiomegaly, vascular congestion.   Electronically Signed   By: Rolm Baptise M.D.   On: 04/21/2013 17:58   Dg Pelvis 1-2 Views  04/21/2013   CLINICAL DATA:  Fall.  Pain.  EXAM: PELVIS - 1-2 VIEW  COMPARISON:  None.  FINDINGS: No acute bony abnormality. Specifically, no fracture, subluxation, or dislocation. Soft tissues are intact. SI joints and hip joints are symmetric.  IMPRESSION: No acute bony abnormality.   Electronically Signed   By: Rolm Baptise M.D.   On: 04/21/2013 18:04   Dg Knee 1-2 Views Left  04/22/2013   CLINICAL DATA:   ORIF of left patella fracture  EXAM: LEFT KNEE - 1-2 VIEW  COMPARISON:  DG KNEE COMPLETE 4 VIEWS*L* dated 04/21/2013  FINDINGS: 2 spot intraoperative fluoroscopic images of the left knee are provided for review.  Images demonstrate the sequela of a lag screw and cerclage wire fixation of previously noted displaced mid pole patellar fracture. Alignment appears improved with persistent minimal articular surface irregularity. Minimal amount of expected adjacent soft tissue swelling. No radiopaque foreign body.  IMPRESSION: Post ORIF of the patella without evidence of complication.   Electronically Signed   By: Sandi Mariscal M.D.   On: 04/22/2013 07:37   Ct Head Wo Contrast  04/21/2013   CLINICAL DATA:  Recent traumatic injury and pain  EXAM: CT HEAD WITHOUT CONTRAST  TECHNIQUE: Contiguous axial images were obtained from the base of the skull through the vertex without intravenous contrast.  COMPARISON:  None.  FINDINGS: The bony calvarium is intact. No gross soft tissue abnormality is noted. Mild atrophic changes are seen. No findings to suggest acute hemorrhage, acute infarction or space-occupying mass lesion are noted.  IMPRESSION: Chronic changes without acute abnormality.   Electronically Signed   By: Inez Catalina M.D.   On: 04/21/2013 18:11   Dg Knee Complete 4 Views Left  04/21/2013   CLINICAL DATA:  [place], pain.  EXAM: LEFT KNEE - COMPLETE 4+ VIEW  COMPARISON:  None.  FINDINGS: There is a comminuted transverse patellar fracture with displacement of the proximal and distal fragments. Relatively dense knee effusion likely representing hemarthrosis. Distal femur and proximal tibia intact. Mild joint space narrowing.  IMPRESSION: Transverse comminuted patellar fracture.  Probable hemarthrosis.   Electronically Signed   By: Rolla Flatten M.D.   On: 04/21/2013 15:27    Scheduled Meds: . aspirin  325 mg Oral Daily  . dicyclomine  10 mg Oral TID AC  . folic acid  1 mg Oral QPM  . heparin  5,000 Units  Subcutaneous 3 times per day  . insulin aspart  0-15 Units Subcutaneous TID WC  . insulin aspart  0-5 Units Subcutaneous QHS  . insulin glargine  30 Units Subcutaneous BID  . metoCLOPramide  10 mg Oral TID WC  . mometasone-formoterol  2 puff Inhalation BID  . pantoprazole  80 mg Oral Q1200  . rifaximin  550 mg Oral BID  . traZODone  200 mg Oral QHS   Continuous Infusions:  Antibiotics Given (last 72 hours)   Date/Time Action Medication Dose Rate   04/21/13 2205 Given   [MAR Hold] clindamycin (CLEOCIN) IVPB 600 mg (On MAR Hold since 04/21/13 2149) 600 mg    04/22/13 0311 Given  clindamycin (CLEOCIN) IVPB 600 mg 600 mg 100 mL/hr   04/22/13 1016 Given   rifaximin (XIFAXAN) tablet 550 mg 550 mg    04/22/13 1017 Given   clindamycin (CLEOCIN) IVPB 600 mg 600 mg 100 mL/hr   04/22/13 1615 Given   clindamycin (CLEOCIN) IVPB 600 mg 600 mg 100 mL/hr   04/22/13 2209 Given   rifaximin (XIFAXAN) tablet 550 mg 550 mg    04/23/13 2641 Given   rifaximin (XIFAXAN) tablet 550 mg 550 mg       Active Problems:   Type II or unspecified type diabetes mellitus with neurological manifestations, uncontrolled   DEPRESSION   COPD   Chronic respiratory failure   Cirrhosis of liver not due to alcohol   Patellar fracture   Left patella fracture    Time spent: 35 min    Derward Marple  [facility] Pager [phone]. If 7PM-7AM, please contact night-coverage at [email or web address], password [facility] 04/23/2013, 11:43 AM  LOS: 2 days

## 2013-04-23 NOTE — Progress Notes (Signed)
Orthopedics Progress Note  Subjective: I feel better today  Objective:  Filed Vitals:   04/23/13 0516  BP: 115/60  Pulse: 91  Temp: 99.5 F (37.5 C)  Resp: 20    General: Awake and alert  Musculoskeletal: Left knee in extension with the immobilizer in place, dressing CDI, NVI distally Neurovascularly intact  Lab Results  Component Value Date   WBC 19.4* 04/22/2013   HGB 11.9* 04/22/2013   HCT 37.0 04/22/2013   MCV 86.0 04/22/2013   PLT 483* 04/22/2013       Component Value Date/Time   NA 139 04/22/2013 0544   NA 140 04/13/2013 1447   K 5.3 04/22/2013 0544   K 4.7 04/13/2013 1447   CL 104 04/22/2013 0544   CL 103 03/23/2012 0938   CO2 20 04/22/2013 0544   CO2 22 04/13/2013 1447   GLUCOSE 162* 04/22/2013 0544   GLUCOSE 121 04/13/2013 1447   GLUCOSE 319* 03/23/2012 0938   BUN 14 04/22/2013 0544   BUN 15.9 04/13/2013 1447   CREATININE 0.85 04/22/2013 0544   CREATININE 1.0 04/13/2013 1447   CALCIUM 9.2 04/22/2013 0544   CALCIUM 10.1 04/13/2013 1447   GFRNONAA 72* 04/22/2013 0544   GFRAA 84* 04/22/2013 0544    Lab Results  Component Value Date   INR 1.00 04/21/2013   INR 1.29 02/27/2013   INR 1.28 02/26/2013    Assessment/Plan: POD # 2s/p ORIF left patella fracture No ROM, ok to weight bear to tolerance with the immobilizer securely on the left leg Skin checks DVT prophylaxis per medicine and mechanical Early mobilization - she is up in a chair today looking good. SNF rehab once bed available  Doran Heater. Veverly Fells, MD 04/23/2013 1:41 PM

## 2013-04-23 NOTE — Progress Notes (Signed)
Physical Therapy Treatment Patient Details Name: [name] MRN: [id number] DOB: Apr 16, 1950 Today's Date: 04/23/2013    History of Present Illness Pt. fell., sustaining a Left patellar fx.  She underwent ORIF of patella    PT Comments    **Pt puts forth good effort, however activity tolerance is limited by severe LLE pain, despite use of pain medication. Good progress expected as pain eases. Today she was able to walk 3' with RW and min A. Decreased assistance needed for supine to sit. *  Follow Up Recommendations  CIR     Equipment Recommendations  None recommended by PT    Recommendations for Other Services       Precautions / Restrictions Precautions Precautions: Fall Required Braces or Orthoses: Knee Immobilizer - Left Knee Immobilizer - Left: On at all times Restrictions Weight Bearing Restrictions: Yes LLE Weight Bearing: Weight bearing as tolerated Other Position/Activity Restrictions: NO ROM LEFT KNEE; quad sets and ankle pumps OK    Mobility  Bed Mobility Overal bed mobility: Needs Assistance Bed Mobility: Supine to Sit     Supine to sit: Mod assist     General bed mobility comments: cues for technique and assist for LEs and trunk, pad used to pivot hips  Transfers Overall transfer level: Needs assistance Equipment used: Rolling walker (2 wheeled) Transfers: Sit to/from Stand Sit to Stand: +2 physical assistance;Mod assist         General transfer comment: assist to rise to stand and for safety and stability  Ambulation/Gait   Ambulation Distance (Feet): 3 Feet Assistive device: Rolling walker (2 wheeled) Gait Pattern/deviations: Step-to pattern;Decreased step length - left;Decreased stance time - left;Antalgic Gait velocity: decreased   General Gait Details: pain with WB on LLE, pain limited distance   Stairs            Wheelchair Mobility    Modified Rankin (Stroke Patients Only)       Balance Overall balance assessment:  Needs assistance Sitting-balance support: Bilateral upper extremity supported Sitting balance-Leahy Scale: Good     Standing balance support: Bilateral upper extremity supported Standing balance-Leahy Scale: Poor Standing balance comment: needs use of RW                    Cognition Arousal/Alertness: Awake/alert Behavior During Therapy: WFL for tasks assessed/performed Overall Cognitive Status: Within Functional Limits for tasks assessed                      Exercises General Exercises - Lower Extremity Ankle Circles/Pumps: AROM;Both;10 reps Quad Sets: Right;5 reps;AROM (pt in too much pain to do L quad sets. REviewed on RLE, pt to attempt L later. )    General Comments        Pertinent Vitals/Pain **9/10 LLE with activity, pt teary Premedicated, ice applied*    Home Living                      Prior Function            PT Goals (current goals can now be found in the care plan section) Acute Rehab PT Goals Patient Stated Goal: wants to return home with daughter in law assisting PT Goal Formulation: With patient Time For Goal Achievement: 04/29/13 Potential to Achieve Goals: Good Progress towards PT goals: Progressing toward goals    Frequency  Min 5X/week    PT Plan Current plan remains appropriate    End of Session Equipment Utilized  During Treatment: Left knee immobilizer;Gait belt;Oxygen Activity Tolerance: Patient limited by pain Patient left: in chair;with call bell/phone within reach     Time: 1302-1329 PT Time Calculation (min): 27 min  Charges:  $Therapeutic Activity: 23-37 mins                    G Codes:      Philomena Doheny 04/23/2013, 1:38 PM [phone]

## 2013-04-23 NOTE — Progress Notes (Signed)
Rehab admissions - Evaluated for possible admission.  I spoke with Dr. [name] who feels patient likely not ready for inpatient rehab until Monday due to ongoing pain issues.  I will follow progress and check back on Monday for readiness.  Call me for questions.  #[phone]

## 2013-04-24 LAB — CBC
HEMATOCRIT: 35.5 % — AB (ref 36.0–46.0)
Hemoglobin: 11.4 g/dL — ABNORMAL LOW (ref 12.0–15.0)
MCH: 27.6 pg (ref 26.0–34.0)
MCHC: 32.1 g/dL (ref 30.0–36.0)
MCV: 86 fL (ref 78.0–100.0)
Platelets: 425 10*3/uL — ABNORMAL HIGH (ref 150–400)
RBC: 4.13 MIL/uL (ref 3.87–5.11)
RDW: 15.7 % — AB (ref 11.5–15.5)
WBC: 19.3 10*3/uL — AB (ref 4.0–10.5)

## 2013-04-24 LAB — GLUCOSE, CAPILLARY
GLUCOSE-CAPILLARY: 176 mg/dL — AB (ref 70–99)
Glucose-Capillary: 204 mg/dL — ABNORMAL HIGH (ref 70–99)
Glucose-Capillary: 135 mg/dL — ABNORMAL HIGH (ref 70–99)
Glucose-Capillary: 195 mg/dL — ABNORMAL HIGH (ref 70–99)
Glucose-Capillary: 170 mg/dL — ABNORMAL HIGH (ref 70–99)

## 2013-04-24 LAB — BASIC METABOLIC PANEL
BUN: 11 mg/dL (ref 6–23)
CHLORIDE: 99 meq/L (ref 96–112)
CO2: 21 mEq/L (ref 19–32)
Calcium: 9.1 mg/dL (ref 8.4–10.5)
Creatinine, Ser: 0.83 mg/dL (ref 0.50–1.10)
GFR calc non Af Amer: 75 mL/min — ABNORMAL LOW (ref >90)
GFR, EST AFRICAN AMERICAN: 86 mL/min — AB (ref >90)
Glucose, Bld: 146 mg/dL — ABNORMAL HIGH (ref 70–99)
POTASSIUM: 4.8 meq/L (ref 3.7–5.3)
SODIUM: 134 meq/L — AB (ref 137–147)

## 2013-04-24 MED ORDER — INSULIN GLARGINE 100 UNIT/ML ~~LOC~~ SOLN
35.0000 [IU] | Freq: Two times a day (BID) | SUBCUTANEOUS | Status: DC
  Administered 2013-04-24 – 2013-04-26 (×5): 35 [IU] via SUBCUTANEOUS
  Filled 2013-04-24 (×6): qty 0.35

## 2013-04-24 NOTE — Progress Notes (Signed)
Physical Therapy Treatment Patient Details Name: [name] MRN: [id number] DOB: 1950-12-02 Today's Date: 04/24/2013    History of Present Illness Pt. fell., sustaining a Left patellar fx.  She underwent ORIF of patella    PT Comments    Pt progressing slowly, was able to ambulate 6' with RW and min A. Continues to be unsteady with mobility and having difficulty coordinating movement (with transfers and gait). Still recommend CIR for rehab before home as pt is motivated and has potential to reach independence.  PT will continue to follow.  Follow Up Recommendations  CIR     Equipment Recommendations  None recommended by PT    Recommendations for Other Services       Precautions / Restrictions Precautions Precautions: Fall Required Braces or Orthoses: Knee Immobilizer - Left Knee Immobilizer - Left: On at all times Restrictions Weight Bearing Restrictions: Yes LLE Weight Bearing: Weight bearing as tolerated Other Position/Activity Restrictions: NO ROM LEFT KNEE; quad sets and ankle pumps OK    Mobility  Bed Mobility Overal bed mobility: Needs Assistance Bed Mobility: Supine to Sit     Supine to sit: Mod assist     General bed mobility comments: pt initiating mvmt well, needing physical assist to get left hip fwd to edge of bed to be able to lower left leg to floor.  Transfers Overall transfer level: Needs assistance Equipment used: Rolling walker (2 wheeled) Transfers: Sit to/from Stand Sit to Stand: Min assist         General transfer comment: assist to rise and steady and pt put wt onto LLE  Ambulation/Gait Ambulation/Gait assistance: Min assist Ambulation Distance (Feet): 6 Feet Assistive device: Rolling walker (2 wheeled) Gait Pattern/deviations: Step-to pattern Gait velocity: decreased Gait velocity interpretation: Below normal speed for age/gender General Gait Details: pt still feeling unsteady with KI on LLE but tolerating pain better to  ambulate. Quick fatigue due to increased wt through UE's for pain control.   Stairs            Wheelchair Mobility    Modified Rankin (Stroke Patients Only)       Balance Overall balance assessment: Needs assistance Sitting-balance support: Single extremity supported;Feet supported Sitting balance-Leahy Scale: Good     Standing balance support: Bilateral upper extremity supported;During functional activity Standing balance-Leahy Scale: Poor Standing balance comment: UE support necessary                    Cognition Arousal/Alertness: Awake/alert Behavior During Therapy: WFL for tasks assessed/performed;Anxious Overall Cognitive Status: Within Functional Limits for tasks assessed                      Exercises General Exercises - Lower Extremity Ankle Circles/Pumps: AROM;Both;10 reps Quad Sets: Both;10 reps;Seated    General Comments        Pertinent Vitals/Pain VSS    Home Living                      Prior Function            PT Goals (current goals can now be found in the care plan section) Acute Rehab PT Goals Patient Stated Goal: wants to return home with daughter in law assisting PT Goal Formulation: With patient Time For Goal Achievement: 04/29/13 Potential to Achieve Goals: Good Progress towards PT goals: Progressing toward goals    Frequency  Min 5X/week    PT Plan Current plan remains appropriate  End of Session Equipment Utilized During Treatment: Left knee immobilizer;Gait belt;Oxygen Activity Tolerance: Patient limited by pain Patient left: in chair;with call bell/phone within reach     Time: 0828-0859 PT Time Calculation (min): 31 min  Charges:  $Gait Training: 8-22 mins $Therapeutic Activity: 8-22 mins                    G Codes:     Leighton [name], PT  Acute Rehab Services  [phone]  Leighton [name] 04/24/2013, 12:24 PM

## 2013-04-24 NOTE — Progress Notes (Signed)
PROGRESS NOTE  [name] BDZ:[id number] DOB: 1950-03-16 DOA: 04/21/2013 PCP: [name], MD  Assessment/Plan:  Comminuted left patellar fracture  -Secondary to mechanical fall  -s/p ORIF by Dr. 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   Chronic respiratory Failure with hx of L-upper lobectomy  -2L  at night  -stable   Leukocytosis  -chronic but improved since splenectomy  Hyperkalemia  -mild  -IVF  -d/c KCl supplement  Code Status: full Family Communication: patient Disposition Plan: CIR monday   Consultants:  ortho  Procedures:  ORIF 3/26    HPI/Subjective: Up with PT Moving better  Objective: Filed Vitals:   04/24/13 0606  BP: 142/60  Pulse: 78  Temp: 98.2 F (36.8 C)  Resp: 17    Intake/Output Summary (Last 24 hours) at 04/24/13 0850 Last data filed at 04/24/13 3419  Gross per 24 hour  Intake    720 ml  Output   2355 ml  Net  -1635 ml   Filed Weights   04/22/13 0705  Weight: 81.9 kg (180 lb 8.9 oz)    Exam:   General:  A+Ox3, NAD- no increased work of breathing  Cardiovascular: rrr  Respiratory: clear  Abdomen: colostomy in place  Musculoskeletal: moves all 4 ext   Data Reviewed: Basic Metabolic Panel:  Recent Labs Lab 04/21/13 1555 04/22/13 0544 04/24/13 0503  NA 138 139 134*  K 5.4* 5.3 4.8  CL 100 104 99  CO2 25 20 21   GLUCOSE 181* 162* 146*  BUN 14 14 11     CREATININE 0.79 0.85 0.83  CALCIUM 10.3 9.2 9.1   Liver Function Tests:  Recent Labs Lab 04/21/13 1555  AST 69*  ALT 54*  ALKPHOS 153*  BILITOT 0.2*  PROT 7.7  ALBUMIN 3.2*   No results found for this basename: LIPASE, AMYLASE,  in the last 168 hours No results found for this basename: AMMONIA,  in the last 168 hours CBC:  Recent Labs Lab 04/21/13 1555 04/22/13 0544 04/24/13 0503  WBC 17.5* 19.4* 19.3*  NEUTROABS 8.5*  --   --   HGB 12.6 11.9* 11.4*  HCT 38.3 37.0 35.5*  MCV 85.5 86.0 86.0  PLT 529* 483* 425*   Cardiac Enzymes: No results found for this basename: CKTOTAL, CKMB, CKMBINDEX, TROPONINI,  in the last 168 hours BNP (last 3 results)  Recent Labs  11/13/12 1334  PROBNP 67.0   CBG:  Recent Labs Lab 04/22/13 1657 04/22/13 2125 04/23/13 0741 04/23/13 1145 04/23/13 1717  GLUCAP 279* 202* 138* 188* 164*    No results found for this or any previous visit (from the past 240 hour(s)).   Studies: No results found.  Scheduled Meds: . aspirin  325 mg Oral Daily  . dicyclomine  10 mg Oral TID AC  . folic acid  1 mg Oral QPM  . heparin  5,000 Units Subcutaneous 3 times per day  .  insulin aspart  0-15 Units Subcutaneous TID WC  . insulin aspart  0-5 Units Subcutaneous QHS  . insulin glargine  30 Units Subcutaneous BID  . metoCLOPramide  10 mg Oral TID WC  . mometasone-formoterol  2 puff Inhalation BID  . pantoprazole  80 mg Oral Q1200  . rifaximin  550 mg Oral BID  . traZODone  200 mg Oral QHS   Continuous Infusions:  Antibiotics Given (last 72 hours)   Date/Time Action Medication Dose Rate   04/21/13 2205 Given   [MAR Hold] clindamycin (CLEOCIN) IVPB 600 mg (On MAR Hold since 04/21/13 2149) 600 mg    04/22/13 0311 Given   clindamycin (CLEOCIN) IVPB 600 mg 600 mg 100 mL/hr   04/22/13 1016 Given   rifaximin (XIFAXAN) tablet 550 mg 550 mg    04/22/13 1017 Given   clindamycin (CLEOCIN) IVPB 600 mg 600 mg 100 mL/hr   04/22/13 1615 Given    clindamycin (CLEOCIN) IVPB 600 mg 600 mg 100 mL/hr   04/22/13 2209 Given   rifaximin (XIFAXAN) tablet 550 mg 550 mg    04/23/13 7096 Given   rifaximin (XIFAXAN) tablet 550 mg 550 mg    04/23/13 2155 Given   rifaximin (XIFAXAN) tablet 550 mg 550 mg       Active Problems:   Type II or unspecified type diabetes mellitus with neurological manifestations, uncontrolled   DEPRESSION   COPD   Chronic respiratory failure   Cirrhosis of liver not due to alcohol   Patellar fracture   Left patella fracture    Time spent: 35 min    Rector Devonshire, [facility] Pager [phone]. If 7PM-7AM, please contact night-coverage at [email or web address], password [facility] 04/24/2013, 8:50 AM  LOS: 3 days

## 2013-04-24 NOTE — Progress Notes (Signed)
[name]  MRN: [id number] DOB/Age: 1950/11/20 62 y.o. Physician: Ander Slade, Berry.D. 3 Days Post-Op    Subjective: Resting comfortably in bedside chair Vital Signs Temp:  [98 F (36.7 C)-99 F (37.2 C)] 98.2 F (36.8 C) (03/28 0606) Pulse Rate:  [55-86] 78 (03/28 0606) Resp:  [17-18] 17 (03/28 0606) BP: (139-149)/(52-60) 142/60 mmHg (03/28 0606) SpO2:  [97 %-99 %] 97 % (03/28 0827)  Lab Results  Recent Labs  04/22/13 0544 04/24/13 0503  WBC 19.4* 19.3*  HGB 11.9* 11.4*  HCT 37.0 35.5*  PLT 483* 425*   BMET  Recent Labs  04/22/13 0544 04/24/13 0503  NA 139 134*  K 5.3 4.8  CL 104 99  CO2 20 21  GLUCOSE 162* 146*  BUN 14 11  CREATININE 0.85 0.83  CALCIUM 9.2 9.1   INR  Date Value Ref Range Status  04/21/2013 1.00  0.00 - 1.49 Final     Exam  Knee immobilizer in place, good ankle motion  Plan SNF  [name] 04/24/2013, 12:01 PM

## 2013-04-25 ENCOUNTER — Encounter (HOSPITAL_COMMUNITY): Payer: Self-pay | Admitting: *Deleted

## 2013-04-25 DIAGNOSIS — I10 Essential (primary) hypertension: Secondary | ICD-10-CM

## 2013-04-25 LAB — GLUCOSE, CAPILLARY
GLUCOSE-CAPILLARY: 174 mg/dL — AB (ref 70–99)
Glucose-Capillary: 126 mg/dL — ABNORMAL HIGH (ref 70–99)
Glucose-Capillary: 131 mg/dL — ABNORMAL HIGH (ref 70–99)
Glucose-Capillary: 164 mg/dL — ABNORMAL HIGH (ref 70–99)
Glucose-Capillary: 193 mg/dL — ABNORMAL HIGH (ref 70–99)

## 2013-04-25 NOTE — Progress Notes (Signed)
   Subjective: 4 Days Post-Op   Patient reports pain as mild.   Patient seen in rounds with Dr. Gladstone Lighter. Patient is well, and has had no acute complaints or problems. She reports that pain is currently under good control. No issues overnight. No SOB or chest pain.  Plan is to go CIR after hospital stay.  Objective: Vital signs in last 24 hours: Temp:  [97.4 F (36.3 C)-98.1 F (36.7 C)] 97.4 F (36.3 C) (03/29 0517) Pulse Rate:  [76-92] 84 (03/29 0517) Resp:  [18-22] 19 (03/29 0517) BP: (141-145)/(50-56) 143/52 mmHg (03/29 0517) SpO2:  [98 %-99 %] 99 % (03/29 0517)  Intake/Output from previous day:  Intake/Output Summary (Last 24 hours) at 04/25/13 0842 Last data filed at 04/25/13 0517  Gross per 24 hour  Intake    600 ml  Output    100 ml  Net    500 ml    Intake/Output this shift:    Labs:  Recent Labs  04/24/13 0503  HGB 11.4*    Recent Labs  04/24/13 0503  WBC 19.3*  RBC 4.13  HCT 35.5*  PLT 425*    Recent Labs  04/24/13 0503  NA 134*  K 4.8  CL 99  CO2 21  BUN 11  CREATININE 0.83  GLUCOSE 146*  CALCIUM 9.1   No results found for this basename: LABPT, INR,  in the last 72 hours  EXAM General - Patient is Alert and Oriented Extremity - Neurologically intact Dorsiflexion/Plantar flexion intact Compartment soft    Past Medical History  Diagnosis Date  . Cirrhosis   . GERD (gastroesophageal reflux disease)   . Cervical disc syndrome   . Lung cancer     squamous cell  . Chronic respiratory failure   . Diverticulitis of colon   . Perforation of colon   . Arthritis   . IBS (irritable bowel syndrome)   . Chronic lower GI bleeding   . Overactive bladder   . Cervical cancer   . Thrombocytopenia     sees Dr. Julien Nordmann   . Splenomegaly   . Anxiety   . Depression   . Hypertension   . Asthma   . Chronic headache disorder   . Blood transfusion without reported diagnosis   . Heart murmur   . Hyperlipidemia   . Lung cancer   . COPD  (chronic obstructive pulmonary disease)     sees Dr. Gwenette Greet   . Anemia     sees Dr. Julien Nordmann, due to chronic disease and GI losses   . Diabetes mellitus     sees Dr. Cruzita Lederer   . Colostomy care   . Hypercalcemia     Assessment/Plan: 4 Days Post-Op   Active Problems:   Type II or unspecified type diabetes mellitus with neurological manifestations, uncontrolled   DEPRESSION   COPD   Chronic respiratory failure   Cirrhosis of liver not due to alcohol   Patellar fracture   Left patella fracture  Estimated body mass index is 35.26 kg/(m^2) as calculated from the following:   Height as of this encounter: 5' (1.524 m).   Weight as of this encounter: 81.9 kg (180 lb 8.9 oz). Advance diet Up with therapy Plan for discharge tomorrow to CIR  DVT Prophylaxis - Aspirin   Continue with therapy today. Plan for discharge to CIR tomorrow.   Ege Muckey, [place] 04/25/2013, 8:42 AM

## 2013-04-25 NOTE — Progress Notes (Signed)
Physical Therapy Treatment Patient Details Name: [name] MRN: [id number] DOB: 1951-01-12 Today's Date: 04/25/2013    History of Present Illness Pt. fell., sustaining a Left patellar fx.  She underwent ORIF of patella    PT Comments    Pt continues steady progress, able to advance gait training and ambulatory distance.  Excellent potential to return to modified independent and hopeful for CIR admittance tomorrow.  Pt is motivated and has great family support.    Follow Up Recommendations  CIR     Equipment Recommendations       Recommendations for Other Services       Precautions / Restrictions Precautions Precautions: Fall Required Braces or Orthoses: Knee Immobilizer - Left Knee Immobilizer - Left: On at all times Restrictions Weight Bearing Restrictions: Yes LLE Weight Bearing: Weight bearing as tolerated Other Position/Activity Restrictions: NO ROM LEFT KNEE; quad sets and ankle pumps OK    Mobility  Bed Mobility Overal bed mobility:  (untested, pt in and back to chair)                Transfers Overall transfer level: Needs assistance Equipment used: Rolling walker (2 wheeled) Transfers: Sit to/from Stand Sit to Stand: Min assist         General transfer comment: instructional cues for hand placment to transition to/from RW and to protect surgical leg, assist to monitor for orthostasis upon standing, with <30 sec resolution of transient dizziness  Ambulation/Gait Ambulation/Gait assistance: Min assist Ambulation Distance (Feet): 40 Feet Assistive device: Rolling walker (2 wheeled) Gait Pattern/deviations: Step-to pattern;Decreased step length - right;Decreased stance time - left;Antalgic;Trunk flexed Gait velocity: decreased Gait velocity interpretation: Below normal speed for age/gender General Gait Details: improved confidence with RW/KI/WBAT with frequent instructional and occasional deomstrational cues for sequencing, use of RW to WBAT and  to increase right step length to approach step-to and step-through pattern.  needs encouragement and confidence due to fear of falling; optimistic and able to increase speed minimally with practice   Stairs            Wheelchair Mobility    Modified Rankin (Stroke Patients Only)       Balance Overall balance assessment: Needs assistance;History of Falls Sitting-balance support: Bilateral upper extremity supported       Standing balance support: Bilateral upper extremity supported;During functional activity Standing balance-Leahy Scale: Poor                      Cognition Arousal/Alertness: Awake/alert Behavior During Therapy: WFL for tasks assessed/performed;Anxious Overall Cognitive Status: Within Functional Limits for tasks assessed                      Exercises General Exercises - Lower Extremity Ankle Circles/Pumps: AROM;Both;10 reps Quad Sets: Both;10 reps;Seated    General Comments General comments (skin integrity, edema, etc.): leg in full ACE wrap, KI in place, opened to place ice to knee x 15-20 minutes      Pertinent Vitals/Pain 5/10 pre 8/10 post, placed ice to knee    Home Living                      Prior Function            PT Goals (current goals can now be found in the care plan section) Acute Rehab PT Goals Patient Stated Goal: wants to return home with daughter in law assisting PT Goal Formulation: With patient Progress towards PT  goals: Progressing toward goals    Frequency  Min 5X/week    PT Plan Current plan remains appropriate    End of Session Equipment Utilized During Treatment: Left knee immobilizer;Gait belt;Oxygen Activity Tolerance: Patient tolerated treatment well Patient left: in chair;with call bell/phone within reach;with family/visitor present     Time: 6244-6950 PT Time Calculation (min): 38 min  Charges:  $Gait Training: 23-37 mins $Therapeutic Exercise: 8-22 mins                     G Codes:      Herbie Drape 04/25/2013, 3:29 PM

## 2013-04-25 NOTE — Progress Notes (Signed)
PROGRESS NOTE  [name] SJG:[id number] DOB: [birth year] DOA: 04/21/2013 PCP: [name], MD  Assessment/Plan:  Comminuted left patellar fracture  -Secondary to mechanical fall  -s/p ORIF by Dr. Esmond Plants  -Pain control--pt normally takes oxycodone 30mg  tid at home for abdominal pain - reschedule pain meds with PT -PT/OT - CIR placement Monday???   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   Chronic respiratory Failure with hx of L-upper lobectomy  -2L [place] at night  -stable   Leukocytosis  -chronic but improved since splenectomy  Hyperkalemia  -mild  -IVF  -d/c KCl supplement  Code Status: full Family Communication: patient Disposition Plan: CIR monday   Consultants:  ortho  Procedures:  ORIF 3/26    HPI/Subjective: Only on oral pain meds Pain controlled  Objective: Filed Vitals:   04/25/13 0517  BP: 143/52  Pulse: 84  Temp: 97.4 F (36.3 C)  Resp: 19    Intake/Output Summary (Last 24 hours) at 04/25/13 1035 Last data filed at 04/25/13 0517  Gross per 24 hour  Intake    600 ml  Output    100 ml  Net    500 ml   Filed Weights   04/22/13 0705  Weight: 81.9 kg (180 lb 8.9 oz)    Exam:   General:  A+Ox3, NAD- no increased work of breathing  Cardiovascular: rrr  Respiratory: clear  Abdomen: colostomy in place  Musculoskeletal: moves all 4 ext   Data Reviewed: Basic Metabolic Panel:  Recent Labs Lab 04/21/13 1555 04/22/13 0544 04/24/13 0503  NA 138 139 134*  K 5.4* 5.3 4.8  CL 100 104 99  CO2 25 20 21   GLUCOSE 181* 162* 146*  BUN 14 14 11     CREATININE 0.79 0.85 0.83  CALCIUM 10.3 9.2 9.1   Liver Function Tests:  Recent Labs Lab 04/21/13 1555  AST 69*  ALT 54*  ALKPHOS 153*  BILITOT 0.2*  PROT 7.7  ALBUMIN 3.2*   No results found for this basename: LIPASE, AMYLASE,  in the last 168 hours No results found for this basename: AMMONIA,  in the last 168 hours CBC:  Recent Labs Lab 04/21/13 1555 04/22/13 0544 04/24/13 0503  WBC 17.5* 19.4* 19.3*  NEUTROABS 8.5*  --   --   HGB 12.6 11.9* 11.4*  HCT 38.3 37.0 35.5*  MCV 85.5 86.0 86.0  PLT 529* 483* 425*   Cardiac Enzymes: No results found for this basename: CKTOTAL, CKMB, CKMBINDEX, TROPONINI,  in the last 168 hours BNP (last 3 results)  Recent Labs  11/13/12 1334  PROBNP 67.0   CBG:  Recent Labs Lab 04/24/13 1141 04/24/13 1743 04/24/13 2139 04/25/13 0754 04/25/13 0822  GLUCAP 176* 195* 170* 131* 126*    No results found for this or any previous visit (from the past 240 hour(s)).   Studies: No results found.  Scheduled Meds: . aspirin  325 mg Oral Daily  . dicyclomine  10 mg Oral TID AC  . folic acid  1 mg Oral QPM  . heparin  5,000 Units Subcutaneous 3 times per day  .  insulin aspart  0-15 Units Subcutaneous TID WC  . insulin aspart  0-5 Units Subcutaneous QHS  . insulin glargine  35 Units Subcutaneous BID  . metoCLOPramide  10 mg Oral TID WC  . mometasone-formoterol  2 puff Inhalation BID  . pantoprazole  80 mg Oral Q1200  . rifaximin  550 mg Oral BID  . traZODone  200 mg Oral QHS   Continuous Infusions:  Antibiotics Given (last 72 hours)   Date/Time Action Medication Dose Rate   04/22/13 1615 Given   clindamycin (CLEOCIN) IVPB 600 mg 600 mg 100 mL/hr   04/22/13 2209 Given   rifaximin (XIFAXAN) tablet 550 mg 550 mg    04/23/13 6440 Given   rifaximin (XIFAXAN) tablet 550 mg 550 mg    04/23/13 2155 Given   rifaximin (XIFAXAN) tablet 550 mg 550 mg    04/24/13 1032 Given   rifaximin (XIFAXAN) tablet 550 mg 550 mg    04/24/13  2124 Given   rifaximin (XIFAXAN) tablet 550 mg 550 mg    04/25/13 1000 Given   rifaximin (XIFAXAN) tablet 550 mg 550 mg       Active Problems:   Type II or unspecified type diabetes mellitus with neurological manifestations, uncontrolled   DEPRESSION   COPD   Chronic respiratory failure   Cirrhosis of liver not due to alcohol   Patellar fracture   Left patella fracture    Time spent: 35 min    Kealii Thueson, [facility] Pager [phone]. If 7PM-7AM, please contact night-coverage at [email or web address], password [facility] 04/25/2013, 10:35 AM  LOS: 4 days

## 2013-04-25 NOTE — Discharge Summary (Signed)
Physician Discharge Summary  [name] ENI:[id number] DOB: 1950-08-19 DOA: 04/21/2013  PCP: Catherine Morale, MD  Admit date: 04/21/2013 Discharge date: 04/25/2013  Time spent: 35 minutes  Recommendations for Outpatient Follow-up:  1. 2L [place] at night and with exertion  Discharge Diagnoses:  Active Problems:   Type II or unspecified type diabetes mellitus with neurological manifestations, uncontrolled   DEPRESSION   COPD   Chronic respiratory failure   Cirrhosis of liver not due to alcohol   Patellar fracture   Left patella fracture   Discharge Condition: improved  Diet: diabetic  Activity WBAT with immobilizer  Filed Weights   04/22/13 0705  Weight: 81.9 kg (180 lb 8.9 oz)    History of present illness:  63 year old female with a history of drug-induced liver cirrhosis, non-small cell lung cancer diagnosed in April 2014 status post left upper lobectomy, 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 The patient also has a history of COPD and wears 2 L nasal cannula at nighttime. There was no syncope. The patient normally ambulates with a walker. There is a hallway at her home to which the patient is unable to use a walker. She was holding onto the wall and had a mechanical fall hitting her left leg. X-rays in the emergency department revealed a comminuted left patella fracture with possible hemarthrosis.In the ED, CT of the brain was unremarkable. BMP showed potassium 5.4, WBC 17.5, chest x-ray with some mild vascular congestion. The patient was hemodynamically stable. Urinalysis was negative for pyuria. EKG shows sinus rhythm with T-wave inversion in V1, V2  Hospital Course:  Comminuted left patellar fracture  -Secondary to mechanical fall  -s/p ORIF by Dr. Esmond Plants  Stable to CIR COPD  -stable on RA presently  -2LNC at night /with exertion at  home  -continue LABA and combivent  Diabetes mellitus type 2  - hemoglobin A1c 6.9  Elevated BP  -no pre-existing diagnosis of HTN  -may be due to pain  -continue to monitor  Drug induced liver cirrhosis  -continue rifaximin  -no signs of encephalopathy  Colostomy  -colon resection due to diverticulitis  Chronic respiratory Failure with hx of L-upper lobectomy  -2L [place] at night  -stable  Leukocytosis  -chronic but improved since splenectomy  Hyperkalemia  Resolved after hydration  Procedures:  ORIF left patella fracture  Consultations:  Ortho  rehab  Discharge Exam: Filed Vitals:   04/25/13 0517  BP: 143/52  Pulse: 84  Temp: 97.4 F (36.3 C)  Resp: 19    General: in chair. Alert and oriented Cardiovascular: rrr Respiratory: cta Ext ace and immobilizer in place  Discharge Instructions   Future Appointments Provider Department Dept Phone   05/04/2013 3:00 PM Philemon Kingdom, MD [facility] [phone]   06/22/2013 1:15 PM [facility] [phone]   06/22/2013 1:45 PM Curt Bears, MD [facility] [phone]   06/22/2013 2:15 PM [facility] [phone]   10/13/2013 10:45 AM Catherine Morale, MD [facility]       Medication List    STOP taking these medications       HYDROmorphone 4 MG tablet  Commonly known as:  DILAUDID      TAKE these medications       albuterol-ipratropium 18-103 MCG/ACT inhaler  Commonly  known as:  COMBIVENT  Inhale 2 puffs into the lungs every 4 (four) hours as needed for wheezing or shortness of breath.     aspirin 325 MG tablet  Take 1 tablet (325 mg total) by mouth daily.     diazepam 5 MG tablet  Commonly known as:  VALIUM  Take 5 mg by mouth 3 (three) times daily as needed for anxiety.     dicyclomine 10 MG capsule  Commonly known as:   BENTYL  TAKE ONE CAPSULE BY MOUTH THREE TIMES DAILY     esomeprazole 40 MG capsule  Commonly known as:  NEXIUM  Take 40 mg by mouth every evening.     Fluticasone-Salmeterol 250-50 MCG/DOSE Aepb  Commonly known as:  ADVAIR  Inhale 1 puff into the lungs every 12 (twelve) hours.     folic acid 1 MG tablet  Commonly known as:  FOLVITE  Take 1 tablet (1 mg total) by mouth every evening.     furosemide 20 MG tablet  Commonly known as:  LASIX  Take 20-40 mg by mouth daily as needed for fluid.     insulin glargine 100 UNIT/ML injection  Commonly known as:  LANTUS  Inject 0.6 mLs (60 Units total) into the skin 2 (two) times daily.     insulin lispro 100 UNIT/ML injection  Commonly known as:  HUMALOG  Inject 35-47 Units into the skin 3 (three) times daily before meals. Injects 47 units daily with breakfast, 35 units daily with lunch, and 45 units daily with supper.     Insulin Pen Needle 32G X 4 MM Misc  Commonly known as:  EASY TOUCH PEN NEEDLES  Use as directed, diagnosis code is 250.00     Insulin Pen Needle 32G X 4 MM Misc  Commonly known as:  BD PEN NEEDLE NANO U/F  Use 4 times daily as directed.     metFORMIN 1000 MG tablet  Commonly known as:  GLUCOPHAGE  Take 1 tablet (1,000 mg total) by mouth 2 (two) times daily with a meal.     metoCLOPramide 10 MG tablet  Commonly known as:  REGLAN  Take 1 tablet (10 mg total) by mouth 3 (three) times daily.     Oxycodone HCl 20 MG Tabs  Take 1 tablet (20 mg total) by mouth every 3 (three) hours as needed (pain).     potassium chloride 10 MEQ CR capsule  Commonly known as:  MICRO-K  Take 1 capsule (10 mEq total) by mouth daily.     promethazine 25 MG tablet  Commonly known as:  PHENERGAN  Take 25 mg by mouth every 6 (six) hours as needed for nausea. For nausea     rifaximin 550 MG Tabs tablet  Commonly known as:  XIFAXAN  Take 550 mg by mouth 2 (two) times daily.     traZODone 100 MG tablet  Commonly known as:  DESYREL   Take 2 tablets (200 mg total) by mouth at bedtime.       Allergies  Allergen Reactions  . Penicillins Anaphylaxis and Rash  . Codeine Phosphate Other (See Comments)    REACTION: Stomach cramps  . Hydrocodone-Acetaminophen Other (See Comments)    REACTION: hallucinations  . Morphine Other (See Comments)    REACTION: Lowers BP  . Other Other (See Comments)    AGENT:  Per pt, cannot take blood thinners due to cirrhosis of the liver  . Cephalexin Swelling and Rash       Follow-up  Information   Follow up with NORRIS,STEVEN R, MD In 2 weeks. [phone])    Specialty:  Orthopedic Surgery   Contact information:   [address] [place] [address] [place] [address] [phone]        The results of significant diagnostics from this hospitalization (including imaging, microbiology, ancillary and laboratory) are listed below for reference.    Significant Diagnostic Studies: Dg Chest 2 View  04/21/2013   CLINICAL DATA:  Fall.  Bilateral chest wall pain.  EXAM: CHEST  2 VIEW  COMPARISON:  To 03/2013  FINDINGS: Heart is mildly enlarged. No confluent airspace opacities in the lungs. Mild vascular congestion. No overt edema. No effusions or acute bony abnormality.  No visible rib fracture or pneumothorax.  IMPRESSION: Mild cardiomegaly, vascular congestion.   Electronically Signed   By: Rolm Baptise M.D.   On: 04/21/2013 17:58   Dg Pelvis 1-2 Views  04/21/2013   CLINICAL DATA:  Fall.  Pain.  EXAM: PELVIS - 1-2 VIEW  COMPARISON:  None.  FINDINGS: No acute bony abnormality. Specifically, no fracture, subluxation, or dislocation. Soft tissues are intact. SI joints and hip joints are symmetric.  IMPRESSION: No acute bony abnormality.   Electronically Signed   By: Rolm Baptise M.D.   On: 04/21/2013 18:04   Dg Knee 1-2 Views Left  04/22/2013   CLINICAL DATA:  ORIF of left patella fracture  EXAM: LEFT KNEE - 1-2 VIEW  COMPARISON:  DG KNEE COMPLETE 4 VIEWS*L* dated 04/21/2013  FINDINGS: 2 spot  intraoperative fluoroscopic images of the left knee are provided for review.  Images demonstrate the sequela of a lag screw and cerclage wire fixation of previously noted displaced mid pole patellar fracture. Alignment appears improved with persistent minimal articular surface irregularity. Minimal amount of expected adjacent soft tissue swelling. No radiopaque foreign body.  IMPRESSION: Post ORIF of the patella without evidence of complication.   Electronically Signed   By: Sandi Mariscal M.D.   On: 04/22/2013 07:37   Ct Head Wo Contrast  04/21/2013   CLINICAL DATA:  Recent traumatic injury and pain  EXAM: CT HEAD WITHOUT CONTRAST  TECHNIQUE: Contiguous axial images were obtained from the base of the skull through the vertex without intravenous contrast.  COMPARISON:  None.  FINDINGS: The bony calvarium is intact. No gross soft tissue abnormality is noted. Mild atrophic changes are seen. No findings to suggest acute hemorrhage, acute infarction or space-occupying mass lesion are noted.  IMPRESSION: Chronic changes without acute abnormality.   Electronically Signed   By: Inez Catalina M.D.   On: 04/21/2013 18:11   Dg Knee Complete 4 Views Left  04/21/2013   CLINICAL DATA:  [place], pain.  EXAM: LEFT KNEE - COMPLETE 4+ VIEW  COMPARISON:  None.  FINDINGS: There is a comminuted transverse patellar fracture with displacement of the proximal and distal fragments. Relatively dense knee effusion likely representing hemarthrosis. Distal femur and proximal tibia intact. Mild joint space narrowing.  IMPRESSION: Transverse comminuted patellar fracture.  Probable hemarthrosis.   Electronically Signed   By: Rolla Flatten M.D.   On: 04/21/2013 15:27    Microbiology: No results found for this or any previous visit (from the past 240 hour(s)).   Labs: Basic Metabolic Panel:  Recent Labs Lab 04/21/13 1555 04/22/13 0544 04/24/13 0503  NA 138 139 134*  K 5.4* 5.3 4.8  CL 100 104 99  CO2 25 20 21   GLUCOSE 181* 162* 146*   BUN 14 14 11   CREATININE 0.79  0.85 0.83  CALCIUM 10.3 9.2 9.1   Liver Function Tests:  Recent Labs Lab 04/21/13 1555  AST 69*  ALT 54*  ALKPHOS 153*  BILITOT 0.2*  PROT 7.7  ALBUMIN 3.2*   No results found for this basename: LIPASE, AMYLASE,  in the last 168 hours No results found for this basename: AMMONIA,  in the last 168 hours CBC:  Recent Labs Lab 04/21/13 1555 04/22/13 0544 04/24/13 0503  WBC 17.5* 19.4* 19.3*  NEUTROABS 8.5*  --   --   HGB 12.6 11.9* 11.4*  HCT 38.3 37.0 35.5*  MCV 85.5 86.0 86.0  PLT 529* 483* 425*   Cardiac Enzymes: No results found for this basename: CKTOTAL, CKMB, CKMBINDEX, TROPONINI,  in the last 168 hours BNP: BNP (last 3 results)  Recent Labs  11/13/12 1334  PROBNP 67.0   CBG:  Recent Labs Lab 04/24/13 1743 04/24/13 2139 04/25/13 0754 04/25/13 0822 04/25/13 1200  GLUCAP 195* 170* 131* 126* 164*   Signed:  VANN, JESSICA  [facility] 04/25/2013, 1:10 PM

## 2013-04-26 ENCOUNTER — Inpatient Hospital Stay (HOSPITAL_COMMUNITY)
Admission: AD | Admit: 2013-04-26 | Discharge: 2013-05-04 | DRG: 945 | Disposition: A | Discharge status: Skilled Nursing Facility | Payer: Medicare Other | Source: Intra-hospital | Attending: Physical Medicine & Rehabilitation | Admitting: Physical Medicine & Rehabilitation

## 2013-04-26 DIAGNOSIS — Z5189 Encounter for other specified aftercare: Principal | ICD-10-CM

## 2013-04-26 DIAGNOSIS — D6959 Other secondary thrombocytopenia: Secondary | ICD-10-CM | POA: Diagnosis present

## 2013-04-26 DIAGNOSIS — Z933 Colostomy status: Secondary | ICD-10-CM

## 2013-04-26 DIAGNOSIS — W010XXA Fall on same level from slipping, tripping and stumbling without subsequent striking against object, initial encounter: Secondary | ICD-10-CM | POA: Diagnosis present

## 2013-04-26 DIAGNOSIS — Z7982 Long term (current) use of aspirin: Secondary | ICD-10-CM

## 2013-04-26 DIAGNOSIS — Y92009 Unspecified place in unspecified non-institutional (private) residence as the place of occurrence of the external cause: Secondary | ICD-10-CM

## 2013-04-26 DIAGNOSIS — K589 Irritable bowel syndrome without diarrhea: Secondary | ICD-10-CM | POA: Diagnosis present

## 2013-04-26 DIAGNOSIS — Z79899 Other long term (current) drug therapy: Secondary | ICD-10-CM

## 2013-04-26 DIAGNOSIS — F339 Major depressive disorder, recurrent, unspecified: Secondary | ICD-10-CM | POA: Diagnosis present

## 2013-04-26 DIAGNOSIS — Z87891 Personal history of nicotine dependence: Secondary | ICD-10-CM

## 2013-04-26 DIAGNOSIS — J449 Chronic obstructive pulmonary disease, unspecified: Secondary | ICD-10-CM | POA: Diagnosis present

## 2013-04-26 DIAGNOSIS — R040 Epistaxis: Secondary | ICD-10-CM | POA: Diagnosis not present

## 2013-04-26 DIAGNOSIS — D72829 Elevated white blood cell count, unspecified: Secondary | ICD-10-CM | POA: Diagnosis present

## 2013-04-26 DIAGNOSIS — Z833 Family history of diabetes mellitus: Secondary | ICD-10-CM

## 2013-04-26 DIAGNOSIS — R109 Unspecified abdominal pain: Secondary | ICD-10-CM | POA: Diagnosis present

## 2013-04-26 DIAGNOSIS — Z85118 Personal history of other malignant neoplasm of bronchus and lung: Secondary | ICD-10-CM

## 2013-04-26 DIAGNOSIS — J4489 Other specified chronic obstructive pulmonary disease: Secondary | ICD-10-CM | POA: Diagnosis present

## 2013-04-26 DIAGNOSIS — I10 Essential (primary) hypertension: Secondary | ICD-10-CM | POA: Diagnosis present

## 2013-04-26 DIAGNOSIS — G8929 Other chronic pain: Secondary | ICD-10-CM | POA: Diagnosis present

## 2013-04-26 DIAGNOSIS — Z9981 Dependence on supplemental oxygen: Secondary | ICD-10-CM

## 2013-04-26 DIAGNOSIS — N318 Other neuromuscular dysfunction of bladder: Secondary | ICD-10-CM | POA: Diagnosis present

## 2013-04-26 DIAGNOSIS — S82009A Unspecified fracture of unspecified patella, initial encounter for closed fracture: Secondary | ICD-10-CM | POA: Diagnosis present

## 2013-04-26 DIAGNOSIS — F3289 Other specified depressive episodes: Secondary | ICD-10-CM | POA: Diagnosis present

## 2013-04-26 DIAGNOSIS — Z8249 Family history of ischemic heart disease and other diseases of the circulatory system: Secondary | ICD-10-CM

## 2013-04-26 DIAGNOSIS — F411 Generalized anxiety disorder: Secondary | ICD-10-CM | POA: Diagnosis present

## 2013-04-26 DIAGNOSIS — D638 Anemia in other chronic diseases classified elsewhere: Secondary | ICD-10-CM | POA: Diagnosis present

## 2013-04-26 DIAGNOSIS — Z902 Acquired absence of lung [part of]: Secondary | ICD-10-CM

## 2013-04-26 DIAGNOSIS — J9610 Chronic respiratory failure, unspecified whether with hypoxia or hypercapnia: Secondary | ICD-10-CM | POA: Diagnosis present

## 2013-04-26 DIAGNOSIS — E119 Type 2 diabetes mellitus without complications: Secondary | ICD-10-CM | POA: Diagnosis present

## 2013-04-26 DIAGNOSIS — S82002A Unspecified fracture of left patella, initial encounter for closed fracture: Secondary | ICD-10-CM | POA: Diagnosis present

## 2013-04-26 DIAGNOSIS — K7460 Unspecified cirrhosis of liver: Secondary | ICD-10-CM | POA: Diagnosis present

## 2013-04-26 DIAGNOSIS — F329 Major depressive disorder, single episode, unspecified: Secondary | ICD-10-CM | POA: Diagnosis present

## 2013-04-26 DIAGNOSIS — Z9089 Acquired absence of other organs: Secondary | ICD-10-CM

## 2013-04-26 DIAGNOSIS — K219 Gastro-esophageal reflux disease without esophagitis: Secondary | ICD-10-CM | POA: Diagnosis present

## 2013-04-26 DIAGNOSIS — Z794 Long term (current) use of insulin: Secondary | ICD-10-CM

## 2013-04-26 DIAGNOSIS — E785 Hyperlipidemia, unspecified: Secondary | ICD-10-CM | POA: Diagnosis present

## 2013-04-26 DIAGNOSIS — Z8541 Personal history of malignant neoplasm of cervix uteri: Secondary | ICD-10-CM

## 2013-04-26 LAB — GLUCOSE, CAPILLARY
GLUCOSE-CAPILLARY: 122 mg/dL — AB (ref 70–99)
GLUCOSE-CAPILLARY: 236 mg/dL — AB (ref 70–99)
GLUCOSE-CAPILLARY: 239 mg/dL — AB (ref 70–99)
GLUCOSE-CAPILLARY: 154 mg/dL — AB (ref 70–99)

## 2013-04-26 MED ORDER — RIFAXIMIN 550 MG PO TABS
550.0000 mg | ORAL_TABLET | Freq: Two times a day (BID) | ORAL | Status: DC
  Administered 2013-04-26 – 2013-05-04 (×16): 550 mg via ORAL
  Filled 2013-04-26 (×18): qty 1

## 2013-04-26 MED ORDER — ALUM & MAG HYDROXIDE-SIMETH 200-200-20 MG/5ML PO SUSP: 30.0000 mL | ORAL | Status: DC | PRN

## 2013-04-26 MED ORDER — DICYCLOMINE HCL 10 MG PO CAPS
10.0000 mg | ORAL_CAPSULE | Freq: Three times a day (TID) | ORAL | Status: DC
  Administered 2013-04-27 – 2013-05-04 (×23): 10 mg via ORAL
  Filled 2013-04-26 (×25): qty 1

## 2013-04-26 MED ORDER — ACETAMINOPHEN 325 MG PO TABS
325.0000 mg | ORAL_TABLET | ORAL | Status: DC | PRN
  Filled 2013-04-26: qty 2

## 2013-04-26 MED ORDER — GLUCERNA PO LIQD: 237.0000 mL | Freq: Three times a day (TID) | ORAL | Status: DC

## 2013-04-26 MED ORDER — PROMETHAZINE HCL 12.5 MG PO TABS: 25.0000 mg | ORAL_TABLET | Freq: Four times a day (QID) | ORAL | Status: DC | PRN

## 2013-04-26 MED ORDER — METHOCARBAMOL 500 MG PO TABS
500.0000 mg | ORAL_TABLET | Freq: Four times a day (QID) | ORAL | Status: DC | PRN
  Administered 2013-04-27 – 2013-05-04 (×15): 500 mg via ORAL
  Filled 2013-04-26 (×15): qty 1

## 2013-04-26 MED ORDER — INSULIN ASPART 100 UNIT/ML ~~LOC~~ SOLN
0.0000 [IU] | Freq: Three times a day (TID) | SUBCUTANEOUS | Status: DC
  Administered 2013-04-27: 3 [IU] via SUBCUTANEOUS
  Administered 2013-04-27: 2 [IU] via SUBCUTANEOUS
  Administered 2013-04-27: 3 [IU] via SUBCUTANEOUS
  Administered 2013-04-28 (×2): 2 [IU] via SUBCUTANEOUS
  Administered 2013-04-28: 3 [IU] via SUBCUTANEOUS
  Administered 2013-04-29: 2 [IU] via SUBCUTANEOUS
  Administered 2013-04-29: 5 [IU] via SUBCUTANEOUS
  Administered 2013-04-30 (×3): 2 [IU] via SUBCUTANEOUS
  Administered 2013-05-01 – 2013-05-02 (×3): 3 [IU] via SUBCUTANEOUS
  Administered 2013-05-02: 5 [IU] via SUBCUTANEOUS
  Administered 2013-05-02: 2 [IU] via SUBCUTANEOUS
  Administered 2013-05-03 (×2): 5 [IU] via SUBCUTANEOUS
  Administered 2013-05-03 – 2013-05-04 (×2): 3 [IU] via SUBCUTANEOUS

## 2013-04-26 MED ORDER — MOMETASONE FURO-FORMOTEROL FUM 100-5 MCG/ACT IN AERO
2.0000 | INHALATION_SPRAY | Freq: Two times a day (BID) | RESPIRATORY_TRACT | Status: DC
  Administered 2013-04-26 – 2013-05-03 (×15): 2 via RESPIRATORY_TRACT
  Filled 2013-04-26: qty 8.8

## 2013-04-26 MED ORDER — POLYETHYLENE GLYCOL 3350 17 G PO PACK
17.0000 g | PACK | Freq: Every day | ORAL | Status: DC | PRN
  Filled 2013-04-26: qty 1

## 2013-04-26 MED ORDER — ONDANSETRON HCL 4 MG PO TABS
4.0000 mg | ORAL_TABLET | Freq: Four times a day (QID) | ORAL | Status: DC | PRN
  Administered 2013-04-30 – 2013-05-02 (×2): 4 mg via ORAL
  Filled 2013-04-26 (×2): qty 1

## 2013-04-26 MED ORDER — BISACODYL 10 MG RE SUPP: 10.0000 mg | Freq: Every day | RECTAL | Status: DC | PRN

## 2013-04-26 MED ORDER — ONDANSETRON HCL 4 MG/2ML IJ SOLN: 4.0000 mg | Freq: Four times a day (QID) | INTRAMUSCULAR | Status: DC | PRN

## 2013-04-26 MED ORDER — OXYCODONE HCL 5 MG PO TABS
20.0000 mg | ORAL_TABLET | ORAL | Status: DC | PRN
  Administered 2013-04-26 – 2013-05-04 (×53): 20 mg via ORAL
  Filled 2013-04-26 (×52): qty 4

## 2013-04-26 MED ORDER — INSULIN GLARGINE 100 UNIT/ML ~~LOC~~ SOLN
35.0000 [IU] | Freq: Two times a day (BID) | SUBCUTANEOUS | Status: DC
  Administered 2013-04-26 – 2013-05-03 (×14): 35 [IU] via SUBCUTANEOUS
  Filled 2013-04-26 (×21): qty 0.35

## 2013-04-26 MED ORDER — ENOXAPARIN SODIUM 40 MG/0.4ML ~~LOC~~ SOLN
40.0000 mg | SUBCUTANEOUS | Status: DC
  Administered 2013-04-26 – 2013-05-03 (×8): 40 mg via SUBCUTANEOUS
  Filled 2013-04-26 (×9): qty 0.4

## 2013-04-26 MED ORDER — GUAIFENESIN-DM 100-10 MG/5ML PO SYRP
5.0000 mL | ORAL_SOLUTION | Freq: Four times a day (QID) | ORAL | Status: DC | PRN
Start: 2013-04-26 — End: 2013-05-04

## 2013-04-26 MED ORDER — FOLIC ACID 1 MG PO TABS
1.0000 mg | ORAL_TABLET | Freq: Every evening | ORAL | Status: DC
  Administered 2013-04-27 – 2013-05-03 (×7): 1 mg via ORAL
  Filled 2013-04-26 (×8): qty 1

## 2013-04-26 MED ORDER — IPRATROPIUM-ALBUTEROL 0.5-2.5 (3) MG/3ML IN SOLN: 3.0000 mL | RESPIRATORY_TRACT | Status: DC | PRN

## 2013-04-26 MED ORDER — METOCLOPRAMIDE HCL 10 MG PO TABS
10.0000 mg | ORAL_TABLET | Freq: Three times a day (TID) | ORAL | Status: DC
  Administered 2013-04-27 – 2013-05-04 (×23): 10 mg via ORAL
  Filled 2013-04-26 (×25): qty 1

## 2013-04-26 MED ORDER — GLUCERNA SHAKE PO LIQD
237.0000 mL | Freq: Three times a day (TID) | ORAL | Status: DC
  Administered 2013-04-26 – 2013-05-04 (×17): 237 mL via ORAL

## 2013-04-26 MED ORDER — FLEET ENEMA 7-19 GM/118ML RE ENEM: 1.0000 | ENEMA | Freq: Once | RECTAL | Status: AC | PRN

## 2013-04-26 MED ORDER — DIAZEPAM 5 MG PO TABS
5.0000 mg | ORAL_TABLET | Freq: Three times a day (TID) | ORAL | Status: DC | PRN
  Administered 2013-04-28 – 2013-05-02 (×4): 5 mg via ORAL
  Filled 2013-04-26 (×4): qty 1

## 2013-04-26 MED ORDER — PANTOPRAZOLE SODIUM 40 MG PO TBEC
80.0000 mg | DELAYED_RELEASE_TABLET | Freq: Every day | ORAL | Status: DC
  Administered 2013-04-27 – 2013-05-04 (×8): 80 mg via ORAL
  Filled 2013-04-26 (×9): qty 2

## 2013-04-26 MED ORDER — ASPIRIN 325 MG PO TABS
325.0000 mg | ORAL_TABLET | Freq: Every day | ORAL | Status: DC
  Administered 2013-04-27 – 2013-05-04 (×8): 325 mg via ORAL
  Filled 2013-04-26 (×9): qty 1

## 2013-04-26 MED ORDER — INSULIN ASPART 100 UNIT/ML ~~LOC~~ SOLN
0.0000 [IU] | Freq: Every day | SUBCUTANEOUS | Status: DC
  Administered 2013-05-02: 2 [IU] via SUBCUTANEOUS

## 2013-04-26 MED ORDER — TRAZODONE HCL 100 MG PO TABS
200.0000 mg | ORAL_TABLET | Freq: Every day | ORAL | Status: DC
  Administered 2013-04-26 – 2013-05-03 (×8): 200 mg via ORAL
  Filled 2013-04-26 (×9): qty 2

## 2013-04-26 MED ORDER — GABAPENTIN 100 MG PO CAPS
100.0000 mg | ORAL_CAPSULE | Freq: Every day | ORAL | Status: DC
  Administered 2013-04-26 – 2013-05-03 (×8): 100 mg via ORAL
  Filled 2013-04-26 (×9): qty 1

## 2013-04-26 NOTE — Progress Notes (Signed)
Rehab admissions - Patient up with PT using walker to chair this am.  She says she is ready for inpatient rehab today.  I spoke with Dr. Conley Canal.  Once cleared by attending MD, will admit to acute inpatient rehab.  Bed available today.  Call me for questions.  #[phone]

## 2013-04-26 NOTE — Interval H&P Note (Signed)
[name] was admitted today to Inpatient Rehabilitation with the diagnosis of left patella fx.  The patient's history has been reviewed, patient examined, and there is no change in status.  Patient continues to be appropriate for intensive inpatient rehabilitation.  I have reviewed the patient's chart and labs.  Questions were answered to the patient's satisfaction.  SWARTZ,ZACHARY T 04/26/2013, 9:24 PM

## 2013-04-26 NOTE — Progress Notes (Signed)
Physical Therapy Treatment Patient Details Name: [name] MRN: [id number] DOB: 28-Jul-1950 Today's Date: 04/26/2013    History of Present Illness Pt. fell., sustaining a Left patellar fx.  She underwent ORIF of patella    PT Comments    Patient with good effort - motivated to work with PT.  Should progress well on Inpatient Rehab.  Follow Up Recommendations  CIR     Equipment Recommendations  None recommended by PT    Recommendations for Other Services       Precautions / Restrictions Precautions Precautions: Fall Required Braces or Orthoses: Knee Immobilizer - Left Knee Immobilizer - Left: On at all times Restrictions Weight Bearing Restrictions: Yes LLE Weight Bearing: Weight bearing as tolerated Other Position/Activity Restrictions: NO ROM LEFT KNEE; quad sets and ankle pumps OK    Mobility  Bed Mobility Overal bed mobility: Needs Assistance Bed Mobility: Supine to Sit     Supine to sit: Mod assist     General bed mobility comments: Verbal cues for technique.  Assist to maneuver LLE, and to raise trunk to sitting position.  Transfers Overall transfer level: Needs assistance Equipment used: Rolling walker (2 wheeled) Transfers: Sit to/from Stand Sit to Stand: Min assist         General transfer comment: Verbal cues for hand placement.  Assist to rise to standing and for balance initially.  Decreased balance initially in standing due to increased pain in LLE.  Improved with time.  Ambulation/Gait Ambulation/Gait assistance: Min assist Ambulation Distance (Feet): 38 Feet Assistive device: Rolling walker (2 wheeled) Gait Pattern/deviations: Step-to pattern;Decreased stance time - left;Decreased step length - right;Antalgic;Trunk flexed Gait velocity: decreased Gait velocity interpretation: Below normal speed for age/gender General Gait Details: Verbal cues for gait sequence.  Cues to try to take longer step with LLE.  More painful today, impacting  gait pattern.   Stairs            Wheelchair Mobility    Modified Rankin (Stroke Patients Only)       Balance           Standing balance support: Bilateral upper extremity supported Standing balance-Leahy Scale: Poor                      Cognition Arousal/Alertness: Awake/alert Behavior During Therapy: WFL for tasks assessed/performed;Anxious Overall Cognitive Status: Within Functional Limits for tasks assessed                      Exercises General Exercises - Lower Extremity Ankle Circles/Pumps: AROM;Both;10 reps;Seated Quad Sets: AROM;Both;10 reps;Seated    General Comments        Pertinent Vitals/Pain Pain 9/10 - continues to limit mobility    Home Living                      Prior Function            PT Goals (current goals can now be found in the care plan section) Progress towards PT goals: Progressing toward goals    Frequency  Min 5X/week    PT Plan Current plan remains appropriate    End of Session Equipment Utilized During Treatment: Gait belt;Left knee immobilizer;Oxygen Activity Tolerance: Patient limited by pain;Patient limited by fatigue Patient left: in chair;with call bell/phone within reach     Time: 1011-1035 PT Time Calculation (min): 24 min  Charges:  $Gait Training: 8-22 mins $Therapeutic Activity: 8-22 mins  G Codes:      Despina Pole 04/26/2013, 11:13 AM Carita Pian. Sanjuana Kava, [place] Pager [phone]

## 2013-04-26 NOTE — H&P (Signed)
Physical Medicine and Rehabilitation Admission H&P    Chief Complaint  Patient presents with  . Knee Injury due to fall  . Left patella fracture.    HPI:   [name] is a 63 y.o. 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  She was admitted 04/21/2013 after mechanical fall without loss of consciousness. X-rays and imaging revealed comminuted left patella fracture with hemarthrosis and she underwent ORIF left patella on 04/22/2013 per Dr. Veverly Fells. Is WBAT with knee immobilizer at all times--No ROM left knee. She has had problems with chronic abdominal pain as well as Left knee pian and anxiety with mobility.  Intermittent blood pressures elevation noted  likely due to pain and currently being monitored. She continues to make slow steady progress and CIR recommended by rehab team. Patient admitted for intensive therapies.    Review of Systems  Constitutional: Positive for malaise/fatigue.  HENT: Negative for hearing loss.   Eyes: Negative for blurred vision and double vision.  Respiratory: Positive for shortness of breath. Negative for cough.   Cardiovascular: Negative for chest pain and palpitations.  Gastrointestinal: Negative for heartburn, nausea and abdominal pain.       Poor appetite (since splenectomy 01/2013)  Musculoskeletal: Positive for joint pain and myalgias.  Neurological: Positive for sensory change. Negative for headaches.  Psychiatric/Behavioral: The patient is nervous/anxious and has insomnia.     Past Medical History  Diagnosis Date  . Cirrhosis   . GERD (gastroesophageal reflux disease)   . Cervical disc syndrome   . Lung cancer     squamous cell  . Chronic respiratory failure   . Diverticulitis of colon   . Perforation of colon   . Arthritis   . IBS  (irritable bowel syndrome)   . Chronic lower GI bleeding   . Overactive bladder   . Cervical cancer   . Thrombocytopenia     sees Dr. Julien Nordmann   . Splenomegaly   . Anxiety   . Depression   . Hypertension   . Asthma   . Chronic headache disorder   . Blood transfusion without reported diagnosis   . Heart murmur   . Hyperlipidemia   . Lung cancer   . COPD (chronic obstructive pulmonary disease)     sees Dr. Gwenette Greet   . Anemia     sees Dr. Julien Nordmann, due to chronic disease and GI losses   . Diabetes mellitus     sees Dr. Cruzita Lederer   . Colostomy care   . Hypercalcemia    Past Surgical History  Procedure Laterality Date  . Appendectomy    . Cholecystectomy    . Colostomy    . Pneumonectomy    . Cervix removed    . Bowel resection    . Esophagogastroduodenoscopy  03/19/2011    Procedure: ESOPHAGOGASTRODUODENOSCOPY (EGD);  Surgeon: [name], MD;  Location: [facility];  Service: Endoscopy;  Laterality: N/A;  . Colonoscopy  03/19/2011    Procedure: COLONOSCOPY;  Surgeon: [name], MD;  Location: [facility];  Service: Endoscopy;  Laterality: N/A;  . Givens capsule study  03/20/2011    Procedure: GIVENS CAPSULE STUDY;  Surgeon: [name], MD;  Location: [facility];  Service: Endoscopy;  Laterality: N/A;  . Small bowel obstruction repair  March 2012  . Umbilical hernia repair  March 2012  . Splenectomy, total N/A 02/24/2013    Procedure: SPLENECTOMY;  Surgeon: Harl Bowie, MD;  Location: [place];  Service: General;  Laterality: N/A;  . Orif patella fracture Left 04/21/2013    DR [name]   Family History  Problem Relation Age of Onset  . Coronary artery disease    . Diabetes type II    . Anesthesia problems Neg Hx   . Hypotension Neg Hx   . Malignant hyperthermia Neg Hx   . Pseudochol deficiency Neg Hx   . Heart attack Mother   . Diabetes type II Mother   . Cirrhosis Father    Social History:  Lives with family. Independent with walker PTA. She  reports  that she quit smoking about 11 years ago. Her smoking use included Cigarettes. She has a 70 pack-year smoking history. She has never used smokeless tobacco. She reports that she does not drink alcohol or use illicit drugs.   Allergies  Allergen Reactions  . Penicillins Anaphylaxis and Rash  . Codeine Phosphate Other (See Comments)    REACTION: Stomach cramps  . Hydrocodone-Acetaminophen Other (See Comments)    REACTION: hallucinations  . Morphine Other (See Comments)    REACTION: Lowers BP  . Other Other (See Comments)    AGENT:  Per pt, cannot take blood thinners due to cirrhosis of the liver  . Cephalexin Swelling and Rash    Medications Prior to Admission  Medication Sig Dispense Refill  . albuterol-ipratropium (COMBIVENT) 18-103 MCG/ACT inhaler Inhale 2 puffs into the lungs every 4 (four) hours as needed for wheezing or shortness of breath.       [name] aspirin 325 MG tablet Take 1 tablet (325 mg total) by mouth daily.  30 tablet  0  . diazepam (VALIUM) 5 MG tablet Take 5 mg by mouth 3 (three) times daily as needed for anxiety.      . dicyclomine (BENTYL) 10 MG capsule TAKE ONE CAPSULE BY MOUTH THREE TIMES DAILY  270 capsule  3  . esomeprazole (NEXIUM) 40 MG capsule Take 40 mg by mouth every evening.      . Fluticasone-Salmeterol (ADVAIR) 250-50 MCG/DOSE AEPB Inhale 1 puff into the lungs every 12 (twelve) hours.  762 each  3  . folic acid (FOLVITE) 1 MG tablet Take 1 tablet (1 mg total) by mouth every evening.  90 tablet  3  . furosemide (LASIX) 20 MG tablet Take 20-40 mg by mouth daily as needed for fluid.      [name] insulin glargine (LANTUS) 100 UNIT/ML injection Inject 0.6 mLs (60 Units total) into the skin 2 (two) times daily.  30 mL  12  . insulin lispro (HUMALOG) 100 UNIT/ML injection Inject 35-47 Units into the skin 3 (three) times daily before meals. Injects 47 units daily with breakfast, 35 units daily with lunch, and 45 units daily with supper.  40 mL  4  . metFORMIN (GLUCOPHAGE)  1000 MG tablet Take 1 tablet (1,000 mg total) by mouth 2 (two) times daily with a meal.  60 tablet  1  . metoCLOPramide (REGLAN) 10 MG tablet Take 1 tablet (10 mg total) by mouth 3 (three) times daily.  270 tablet  3  . Oxycodone HCl 20 MG TABS Take 1 tablet (20 mg total) by mouth every 3 (three) hours as needed (pain).  240 tablet  0  . potassium chloride (MICRO-K) 10 MEQ CR capsule Take 1 capsule (10 mEq total) by mouth daily.  90 capsule  3  . promethazine (PHENERGAN) 25 MG tablet Take 25 mg by mouth every 6 (six) hours as needed for nausea. For nausea      . rifaximin (XIFAXAN) 550 MG TABS tablet Take 550 mg by mouth 2 (two) times daily.      . traZODone (DESYREL) 100 MG tablet Take 2 tablets (200 mg total) by mouth at bedtime.  180 tablet  3  . [EXPIRED] HYDROmorphone (DILAUDID) 4 MG tablet Take 2-8 mg by mouth every 6 (six) hours as needed for moderate pain.      . Insulin Pen Needle (BD PEN NEEDLE NANO U/F) 32G X 4 MM MISC Use 4 times daily as directed.  150 each  3  . Insulin Pen Needle (EASY TOUCH PEN NEEDLES) 32G X 4 MM MISC Use as directed, diagnosis code is 250.00  100 each  3    Home: Home Living Family/patient expects to be discharged to:: Private residence Living Arrangements: Children;Other relatives Available Help at Discharge: Available 24 hours/day (daughter-in-law) Type of Home: House Home Access: Stairs to enter [employer] of Steps: 3 Entrance Stairs-Rails: Right;Left (pt. reports they are "wobbly") Home Layout: One level Home Equipment: Cane - single point;Walker - 2 wheels   Functional History:    Functional Status:  Mobility:min to mod assist for basic mobility      Ambulation/Gait Ambulation Distance (Feet): 40 Feet Gait velocity: decreased General Gait Details: improved confidence with RW/KI/WBAT with frequent instructional and occasional deomstrational cues for sequencing, use of RW to WBAT and to increase right step length to approach step-to  and step-through pattern.  needs encouragement and confidence due to fear of falling; optimistic and able to increase speed minimally with practice    ADL:    Cognition: Cognition Overall Cognitive Status: Within Functional Limits for tasks assessed Orientation Level: Oriented X4 Cognition Arousal/Alertness: Awake/alert Behavior During Therapy: WFL for tasks assessed/performed;Anxious Overall Cognitive Status: Within Functional Limits for tasks assessed  Physical Exam: Blood pressure 138/58, pulse 86, temperature 98.3 F (36.8 C), temperature source Oral, resp. rate 20, height 5' (1.524 m), weight 81.9 kg (180 lb 8.9 oz), SpO2 98.00%. Physical Exam  Nursing note and vitals reviewed.  Constitutional: She is oriented to person, place, and time.  HENT: dentition poor, oral mucosa pink and moist Head: Normocephalic.  Eyes: EOM are normal.  Neck: Normal range of motion. Neck supple. No thyromegaly present.  Cardiovascular: Normal rate and regular rhythm.  Respiratory: Effort normal and breath sounds normal. No respiratory distress.  GI: Soft. Bowel sounds are normal. She exhibits no distension.  Colostomy tube in place, no leaks. Stool with expected form Neurological: She is alert and oriented to person, place, and time.  Skin:  Knee immobilizer left lower extremity  motor strength 5/5 in bilateral deltoid, bicep, tricep, grip,  4/5 in the right hip flexor, ksssssssnot tested secondary to pain left ankle dorsiflexor plantar flexor 3 minus secondary to pain  Knee immobilizer in place  Extremity no evidence of calf edema on the right side   Left Knee--incision clean and dry with staples in place. Incision with central erythema with dark center (bruise) noted. Hypersensitive to touch. Minimal edema.    Results for orders placed during the hospital encounter of 04/21/13 (from the past 48 hour(s))  GLUCOSE, CAPILLARY     Status: Abnormal   Collection Time    04/24/13 11:41 AM       Result Value Ref Range   Glucose-Capillary 176 (*) 70 - 99 mg/dL  Comment 1 Notify RN    GLUCOSE, CAPILLARY     Status: Abnormal   Collection Time    04/24/13  5:43 PM      Result Value Ref Range   Glucose-Capillary 195 (*) 70 - 99 mg/dL   Comment 1 Notify RN    GLUCOSE, CAPILLARY     Status: Abnormal   Collection Time    04/24/13  9:39 PM      Result Value Ref Range   Glucose-Capillary 170 (*) 70 - 99 mg/dL   Comment 1 Notify RN    GLUCOSE, CAPILLARY     Status: Abnormal   Collection Time    04/25/13  7:54 AM      Result Value Ref Range   Glucose-Capillary 131 (*) 70 - 99 mg/dL   Comment 1 Notify RN    GLUCOSE, CAPILLARY     Status: Abnormal   Collection Time    04/25/13  8:22 AM      Result Value Ref Range   Glucose-Capillary 126 (*) 70 - 99 mg/dL  GLUCOSE, CAPILLARY     Status: Abnormal   Collection Time    04/25/13 12:00 PM      Result Value Ref Range   Glucose-Capillary 164 (*) 70 - 99 mg/dL  GLUCOSE, CAPILLARY     Status: Abnormal   Collection Time    04/25/13  5:05 PM      Result Value Ref Range   Glucose-Capillary 193 (*) 70 - 99 mg/dL  GLUCOSE, CAPILLARY     Status: Abnormal   Collection Time    04/25/13  9:38 PM      Result Value Ref Range   Glucose-Capillary 174 (*) 70 - 99 mg/dL   Comment 1 Notify RN    GLUCOSE, CAPILLARY     Status: Abnormal   Collection Time    04/26/13  7:49 AM      Result Value Ref Range   Glucose-Capillary 122 (*) 70 - 99 mg/dL   No results found.  Post Admission Physician Evaluation: 1. Functional deficits secondary  to left patella fx after all. 2. Patient is admitted to receive collaborative, interdisciplinary care between the physiatrist, rehab nursing staff, and therapy team. 3. Patient's level of medical complexity and substantial therapy needs in context of that medical necessity cannot be provided at a lesser intensity of care such as a SNF. 4. Patient has experienced substantial functional loss from his/her baseline  which was documented above under the "Functional History" and "Functional Status" headings.  Judging by the patient's diagnosis, physical exam, and functional history, the patient has potential for functional progress which will result in measurable gains while on inpatient rehab.  These gains will be of substantial and practical use upon discharge  in facilitating mobility and self-care at the household level. 5. Physiatrist will provide 24 hour management of medical needs as well as oversight of the therapy plan/treatment and provide guidance as appropriate regarding the interaction of the two. 6. 24 hour rehab nursing will assist with bladder management, bowel management, safety, skin/wound care, disease management, medication administration, pain management and patient education  and help integrate therapy concepts, techniques,education, etc. 7. PT will assess and treat for/with: Lower extremity strength, range of motion, stamina, balance, functional mobility, safety, adaptive techniques and equipment, ortho precuations, safety, pain mgt.   Goals are: mod i. 8. OT will assess and treat for/with: ADL's, functional mobility, safety, upper extremity strength, adaptive techniques and equipment, pain mgt, ortho precautions, education.  Goals are: mod I to set up. 9. SLP will assess and treat for/with: n/a.  Goals are: n/a. 10. Case Management and Social Worker will assess and treat for psychological issues and discharge planning. 11. Team conference will be held weekly to assess progress toward goals and to determine barriers to discharge. 12. Patient will receive at least 3 hours of therapy per day at least 5 days per week. 13. ELOS: 11-17 days       14. Prognosis:  excellent   Medical Problem List and Plan: Displaced Left patellar fracture secondary to fall 04/21/2013  1. DVT Prophylaxis/Anticoagulation: Pharmaceutical: Lovenox 2. Chronic abdominal pain/Pain Management:  Continue oxycodone and   Bentlyl for chronic pain. Will add neurontin  for dysesthesias left knee.  3. Anxiety disorder/ Mood: Provide ego support for high levels of anxiety. This will hopefully improve with time, ego support and ongoing therapy. LCSW to follow for evaluation and support.  4. Neuropsych: This patient is capable of making decisions on her own behalf. 5. NSCL cancer/ COPD: Oxygen with activity and at nights. Continue Dulera.  6. DM type 2: Will monitor with bid checks. Continue lantus 35 units bid with SSI for elevated BS.  7. Drug induced Liver cirrhosis:  On rifaximin. GI symptoms stable but with poor intake. Will add supplements with meals.  8. Chronic Leucocytosis:  Trend 18.2--->17.5-->19.3. No fevers. No signs of infection---recheck in am.  9. Anemia of chronic disease: stable.  Will recheck in am.  10. Wound care: pt proficient in ostomy care- follow for leaks or issues  -staples in left knee- clean and intact. Change daily or as needed   Meredith Staggers, MD, [facility]   04/26/2013

## 2013-04-26 NOTE — Progress Notes (Signed)
Report was called to Ed the nurse from inpatient rehab unit 4000.

## 2013-04-26 NOTE — PMR Pre-admission (Signed)
PMR Admission Coordinator Pre-Admission Assessment  Patient: [name] is an 63 y.o., female MRN: [id number] DOB: 10/25/1950 Height: 5' (152.4 cm) Weight: 81.9 kg (180 lb 8.9 oz)              Insurance Information HMO:      PPO:       PCP:       IPA:       80/20:       OTHER:   PRIMARY: Medicare A/B      Policy#: [id number]      Subscriber: [name] CM Name:        Phone#:       Fax#:   Pre-Cert#:        Employer: Disabled Benefits:  Phone #:       Name: Checked in [place]. Date: 07/28/04     Deduct: $1260      Out of Pocket Max: none      Life Max: unlimited CIR: 100%      SNF: 100Days   LBD=03/08/13 Outpatient: 80%     Co-Pay: 20% Home Health: 100%      Co-Pay: none DME: 80%     Co-Pay: 20% Providers: patient's choice  SECONDARY: Medicaid [place]      Policy#: [id number]      Subscriber: [name] CM Name:        Phone#:       Fax#:   Pre-Cert#:        Employer: Disabled Benefits:  Phone #: [phone]     Name: Automated Eff. Date: 04/26/2013     Deduct:        Out of Pocket Max:        Life Max:   CIR:        SNF:   Outpatient:       Co-Pay:   Home Health:        Co-Pay:   DME:       Co-Pay:     Emergency Contact Information Contact Information   Name Relation Home Work Mobile   [place] Son [phone]     Hatch,April Daughter [phone]     Delane Ginger Daughter [phone]     Rice,Leanne Daughter [phone]       Current Medical History  Patient Admitting Diagnosis: Left patellar fracture secondary to fall 04/21/2013   History of Present Illness: A 63 y.o. 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 She was admitted 04/21/2013 after mechanical fall without loss of consciousness. X-rays and imaging revealed comminuted left patella  fracture with hemarthrosis and she underwent ORIF left patella on 04/22/2013 per Dr. Veverly Fells. Is WBAT with knee immobilizer at all times--No ROM left knee. She has had problems with chronic abdominal pain as well as Left knee pian and anxiety with mobility. Intermittent blood pressures elevation noted likely due to pain and currently being monitored.  Therapies initiated and CIR recommended by rehab team.      Past Medical History  Past Medical History  Diagnosis Date  . Cirrhosis   . GERD (gastroesophageal reflux disease)   . Cervical disc syndrome   . Lung cancer     squamous cell  . Chronic respiratory failure   . Diverticulitis of colon   . Perforation of colon   . Arthritis   . IBS (  irritable bowel syndrome)   . Chronic lower GI bleeding   . Overactive bladder   . Cervical cancer   . Thrombocytopenia     sees Dr. Julien Nordmann   . Splenomegaly   . Anxiety   . Depression   . Hypertension   . Asthma   . Chronic headache disorder   . Blood transfusion without reported diagnosis   . Heart murmur   . Hyperlipidemia   . Lung cancer   . COPD (chronic obstructive pulmonary disease)     sees Dr. Gwenette Greet   . Anemia     sees Dr. Julien Nordmann, due to chronic disease and GI losses   . Diabetes mellitus     sees Dr. Cruzita Lederer   . Colostomy care   . Hypercalcemia     Family History  family history includes Cirrhosis in her father; Coronary artery disease in an other family member; Diabetes type II in her mother and another family member; Heart attack in her mother. There is no history of Anesthesia problems, Hypotension, Malignant hyperthermia, or Pseudochol deficiency.  Prior Rehab/Hospitalizations:  Had outpatient therapy yrs ago.   Current Medications  Current facility-administered medications:acetaminophen (TYLENOL) suppository 650 mg, 650 mg, Rectal, Q6H PRN, Orson Eva, MD;  acetaminophen (TYLENOL) tablet 650 mg, 650 mg, Oral, Q6H PRN, Orson Eva, MD;  aspirin tablet 325 mg, 325 mg, Oral,  Daily, Orson Eva, MD, 325 mg at 04/26/13 1046;  diazepam (VALIUM) tablet 5 mg, 5 mg, Oral, TID PRN, Orson Eva, MD, 5 mg at 04/25/13 2042 dicyclomine (BENTYL) capsule 10 mg, 10 mg, Oral, TID Mina Marble, MD, 10 mg at 10/93/23 5573;  folic acid (FOLVITE) tablet 1 mg, 1 mg, Oral, QPM, Orson Eva, MD, 1 mg at 04/25/13 1717;  heparin injection 5,000 Units, 5,000 Units, Subcutaneous, 3 times per day, Orson Eva, MD, 5,000 Units at 04/26/13 0540;  HYDROmorphone (DILAUDID) injection 1 mg, 1 mg, Intravenous, Q4H PRN, Orson Eva, MD, 1 mg at 04/26/13 1046 insulin aspart (novoLOG) injection 0-15 Units, 0-15 Units, Subcutaneous, TID WC, Orson Eva, MD, 2 Units at 04/26/13 [id number];  insulin aspart (novoLOG) injection 0-5 Units, 0-5 Units, Subcutaneous, QHS, Orson Eva, MD, 2 Units at 04/23/13 2155;  insulin glargine (LANTUS) injection 35 Units, 35 Units, Subcutaneous, BID, Geradine Girt, DO, 35 Units at 04/26/13 1050 ipratropium-albuterol (DUONEB) 0.5-2.5 (3) MG/3ML nebulizer solution 3 mL, 3 mL, Inhalation, Q4H PRN, Orson Eva, MD;  metoCLOPramide (REGLAN) injection 5-10 mg, 5-10 mg, Intravenous, Q8H PRN, Augustin Schooling, MD;  metoCLOPramide (REGLAN) tablet 10 mg, 10 mg, Oral, TID WC, Orson Eva, MD, 10 mg at 04/26/13 1046;  metoCLOPramide (REGLAN) tablet 5-10 mg, 5-10 mg, Oral, Q8H PRN, Augustin Schooling, MD mometasone-formoterol [facility]) 100-5 MCG/ACT inhaler 2 puff, 2 puff, Inhalation, BID, Orson Eva, MD, 2 puff at 04/26/13 0800;  ondansetron [facility]) injection 4 mg, 4 mg, Intravenous, Q6H PRN, Augustin Schooling, MD;  ondansetron [facility]) tablet 4 mg, 4 mg, Oral, Q6H PRN, Augustin Schooling, MD;  oxyCODONE (Oxy IR/ROXICODONE) immediate release tablet 20 mg, 20 mg, Oral, Q3H PRN, Orson Eva, MD, 20 mg at 04/26/13 0844 pantoprazole (PROTONIX) EC tablet 80 mg, 80 mg, Oral, Q1200, Orson Eva, MD, 80 mg at 04/25/13 1217;  promethazine (PHENERGAN) tablet 25 mg, 25 mg, Oral, Q6H PRN, Orson Eva, MD;  rifaximin [facility]) tablet 550 mg, 550 mg,  Oral, BID, Orson Eva, MD, 550 mg at 04/26/13 1046;  traZODone (DESYREL) tablet 200 mg, 200 mg, Oral, QHS, Orson Eva, MD, 200 mg  at 04/25/13 2136  Patients Current Diet: Carb Control  Precautions / Restrictions Precautions Precautions: Fall Restrictions Weight Bearing Restrictions: Yes LLE Weight Bearing: Weight bearing as tolerated Other Position/Activity Restrictions: NO ROM LEFT KNEE; quad sets and ankle pumps OK   Prior Activity Level Limited Community (1-2x/wk): Went out 1-2 X a week.  Went to church on Sundays.  Home Assistive Devices / Equipment Home Assistive Devices/Equipment: Oxygen;Walker (specify type);Cane (specify quad or straight) Home Equipment: Cane - single point;Walker - 2 wheels  Prior Functional Level Prior Function Level of Independence: Independent with assistive device(s)  Current Functional Level Cognition  Overall Cognitive Status: Within Functional Limits for tasks assessed Orientation Level: Oriented X4    Extremity Assessment (includes Sensation/Coordination)  Upper Extremity Assessment: Overall WFL for tasks assessed  Lower Extremity Assessment: Overall WFL for tasks assessed;LLE deficits/detail  LLE Deficits / Details: not tested fully due to ROM restrictions, however pt. had fair quad set and good ankle pump  Cervical / Trunk Assessment: Normal      ADLs  Anticipate ADL needs    Mobility  Overal bed mobility: Needs Assistance Bed Mobility: Supine to Sit Supine to sit: Mod assist General bed mobility comments: Verbal cues for technique.  Assist to maneuver LLE, and to raise trunk to sitting position.    Transfers  Overall transfer level: Needs assistance Equipment used: Rolling walker (2 wheeled) Transfers: Sit to/from Stand Sit to Stand: Min assist General transfer comment: Verbal cues for hand placement.  Assist to rise to standing and for balance initially.  Decreased balance initially in standing due to increased pain in LLE.   Improved with time.    Ambulation / Gait / Stairs / Wheelchair Mobility  Ambulation/Gait Ambulation/Gait assistance: Museum/gallery curator (Feet): 38 Feet Assistive device: Rolling walker (2 wheeled) Gait Pattern/deviations: Step-to pattern;Decreased stance time - left;Decreased step length - right;Antalgic;Trunk flexed Gait velocity: decreased Gait velocity interpretation: Below normal speed for age/gender General Gait Details: Verbal cues for gait sequence.  Cues to try to take longer step with LLE.  More painful today, impacting gait pattern.    Posture / Balance      Special needs/care consideration BiPAP/CPAP NO CPM No Continuous Drip IV No Dialysis No         Life Vest No Oxygen Yes, on O2 2 L/[place] Special Bed No Trach Size No Wound Vac (area)No      Skin Has a gash healing on right cheek                             Bowel mgmt: Colostomy bag in place with stool in colostomy Bladder mgmt: Voiding on bedpan and on BSC Diabetic mgmt Yes, on insulin    Previous Home Environment Living Arrangements: Children;Other relatives Available Help at Discharge: Available 24 hours/day (daughter-in-law) Type of Home: House Home Layout: One level Home Access: Stairs to enter Entrance Stairs-Rails: Right;Left (pt. reports they are "wobbly") Entrance Stairs-Number of Steps: [place]: No  Discharge Living Setting Plans for Discharge Living Setting: House;Lives with (comment) (Lives with son, dtr-in-law and grand children.) Type of Home at Discharge: House Discharge Home Layout: One level Discharge Home Access: Stairs to enter Entrance Stairs-Number of Steps: 3 Does the patient have any problems obtaining your medications?: No  Social/Family/Support Systems Patient Roles: Parent Contact Information: Erven Colla - son [phone] Anticipated Caregiver: Delane Ginger - daughter in law Ability/Limitations of Caregiver: Son works.  Dtr-in-law  stays home with the  children and can provide supervision Caregiver Availability: 24/7 Discharge Plan Discussed with Primary Caregiver: Yes Is Caregiver In Agreement with Plan?: Yes Does Caregiver/Family have Issues with Lodging/Transportation while Pt is in Rehab?: No  Goals/Additional Needs Patient/Family Goal for Rehab: PT/OT Mod I/S goals Expected length of stay: 7-10 days Cultural Considerations: [employer] Dietary Needs: Carb mod med cal, thin liquids Equipment Needs: TBD Pt/Family Agrees to Admission and willing to participate: Yes Program Orientation Provided & Reviewed with Pt/Caregiver Including Roles  & Responsibilities: Yes  Decrease burden of Care through IP rehab admission: N/A  Possible need for SNF placement upon discharge: Not planned  Patient Condition: This patient's medical and functional status has changed since the consult dated:04/22/13 in which the Rehabilitation Physician determined and documented that the patient's condition is appropriate for intensive rehabilitative care in an inpatient rehabilitation facility. See "History of Present Illness" (above) for medical update. Functional changes are:  Currently requiring min assist to ambulate 38 ft RW. Patient's medical and functional status update has been discussed with the Rehabilitation physician and patient remains appropriate for inpatient rehabilitation. Will admit to inpatient rehab today.  Preadmission Screen Completed By:  Retta Diones, 04/26/2013 11:59 AM ______________________________________________________________________   Discussed status with Dr.  Naaman Plummer on 04/26/13 at 1219 and received telephone approval for admission today.  Admission Coordinator:  Retta Diones, time1219/Date03/30/15

## 2013-04-26 NOTE — H&P (View-Only) (Signed)
Physical Medicine and Rehabilitation Admission H&P    Chief Complaint  Patient presents with  . Knee Injury due to fall  . Left patella fracture.    HPI:   [name] is a 63 y.o. 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  She was admitted 04/21/2013 after mechanical fall without loss of consciousness. X-rays and imaging revealed comminuted left patella fracture with hemarthrosis and she underwent ORIF left patella on 04/22/2013 per Dr. Veverly Fells. Is WBAT with knee immobilizer at all times--No ROM left knee. She has had problems with chronic abdominal pain as well as Left knee pian and anxiety with mobility.  Intermittent blood pressures elevation noted  likely due to pain and currently being monitored. She continues to make slow steady progress and CIR recommended by rehab team. Patient admitted for intensive therapies.    Review of Systems  Constitutional: Positive for malaise/fatigue.  HENT: Negative for hearing loss.   Eyes: Negative for blurred vision and double vision.  Respiratory: Positive for shortness of breath. Negative for cough.   Cardiovascular: Negative for chest pain and palpitations.  Gastrointestinal: Negative for heartburn, nausea and abdominal pain.       Poor appetite (since splenectomy 01/2013)  Musculoskeletal: Positive for joint pain and myalgias.  Neurological: Positive for sensory change. Negative for headaches.  Psychiatric/Behavioral: The patient is nervous/anxious and has insomnia.     Past Medical History  Diagnosis Date  . Cirrhosis   . GERD (gastroesophageal reflux disease)   . Cervical disc syndrome   . Lung cancer     squamous cell  . Chronic respiratory failure   . Diverticulitis of colon   . Perforation of colon   . Arthritis   . IBS  (irritable bowel syndrome)   . Chronic lower GI bleeding   . Overactive bladder   . Cervical cancer   . Thrombocytopenia     sees Dr. Julien Nordmann   . Splenomegaly   . Anxiety   . Depression   . Hypertension   . Asthma   . Chronic headache disorder   . Blood transfusion without reported diagnosis   . Heart murmur   . Hyperlipidemia   . Lung cancer   . COPD (chronic obstructive pulmonary disease)     sees Dr. Gwenette Greet   . Anemia     sees Dr. Julien Nordmann, due to chronic disease and GI losses   . Diabetes mellitus     sees Dr. Cruzita Lederer   . Colostomy care   . Hypercalcemia    Past Surgical History  Procedure Laterality Date  . Appendectomy    . Cholecystectomy    . Colostomy    . Pneumonectomy    . Cervix removed    . Bowel resection    . Esophagogastroduodenoscopy  03/19/2011    Procedure: ESOPHAGOGASTRODUODENOSCOPY (EGD);  Surgeon: [name], MD;  Location: [facility];  Service: Endoscopy;  Laterality: N/A;  . Colonoscopy  03/19/2011    Procedure: COLONOSCOPY;  Surgeon: [name], MD;  Location: [facility];  Service: Endoscopy;  Laterality: N/A;  . Givens capsule study  03/20/2011    Procedure: GIVENS CAPSULE STUDY;  Surgeon: [name], MD;  Location: [facility];  Service: Endoscopy;  Laterality: N/A;  . Small bowel obstruction repair  March 2012  . Umbilical hernia repair  March 2012  . Splenectomy, total N/A 02/24/2013    Procedure: SPLENECTOMY;  Surgeon: Harl Bowie, MD;  Location: [facility];  Service: General;  Laterality: N/A;  . Orif patella fracture Left 04/21/2013    DR [name]   Family History  Problem Relation Age of Onset  . Coronary artery disease    . Diabetes type II    . Anesthesia problems Neg Hx   . Hypotension Neg Hx   . Malignant hyperthermia Neg Hx   . Pseudochol deficiency Neg Hx   . Heart attack Mother   . Diabetes type II Mother   . Cirrhosis Father    Social History:  Lives with family. Independent with walker PTA. She  reports  that she quit smoking about 11 years ago. Her smoking use included Cigarettes. She has a 70 pack-year smoking history. She has never used smokeless tobacco. She reports that she does not drink alcohol or use illicit drugs.   Allergies  Allergen Reactions  . Penicillins Anaphylaxis and Rash  . Codeine Phosphate Other (See Comments)    REACTION: Stomach cramps  . Hydrocodone-Acetaminophen Other (See Comments)    REACTION: hallucinations  . Morphine Other (See Comments)    REACTION: Lowers BP  . Other Other (See Comments)    AGENT:  Per pt, cannot take blood thinners due to cirrhosis of the liver  . Cephalexin Swelling and Rash    Medications Prior to Admission  Medication Sig Dispense Refill  . albuterol-ipratropium (COMBIVENT) 18-103 MCG/ACT inhaler Inhale 2 puffs into the lungs every 4 (four) hours as needed for wheezing or shortness of breath.       [name] aspirin 325 MG tablet Take 1 tablet (325 mg total) by mouth daily.  30 tablet  0  . diazepam (VALIUM) 5 MG tablet Take 5 mg by mouth 3 (three) times daily as needed for anxiety.      . dicyclomine (BENTYL) 10 MG capsule TAKE ONE CAPSULE BY MOUTH THREE TIMES DAILY  270 capsule  3  . esomeprazole (NEXIUM) 40 MG capsule Take 40 mg by mouth every evening.      . Fluticasone-Salmeterol (ADVAIR) 250-50 MCG/DOSE AEPB Inhale 1 puff into the lungs every 12 (twelve) hours.  798 each  3  . folic acid (FOLVITE) 1 MG tablet Take 1 tablet (1 mg total) by mouth every evening.  90 tablet  3  . furosemide (LASIX) 20 MG tablet Take 20-40 mg by mouth daily as needed for fluid.      [name] insulin glargine (LANTUS) 100 UNIT/ML injection Inject 0.6 mLs (60 Units total) into the skin 2 (two) times daily.  30 mL  12  . insulin lispro (HUMALOG) 100 UNIT/ML injection Inject 35-47 Units into the skin 3 (three) times daily before meals. Injects 47 units daily with breakfast, 35 units daily with lunch, and 45 units daily with supper.  40 mL  4  . metFORMIN (GLUCOPHAGE)  1000 MG tablet Take 1 tablet (1,000 mg total) by mouth 2 (two) times daily with a meal.  60 tablet  1  . metoCLOPramide (REGLAN) 10 MG tablet Take 1 tablet (10 mg total) by mouth 3 (three) times daily.  270 tablet  3  . Oxycodone HCl 20 MG TABS Take 1 tablet (20 mg total) by mouth every 3 (three) hours as needed (pain).  240 tablet  0  . potassium chloride (MICRO-K) 10 MEQ CR capsule Take 1 capsule (10 mEq total) by mouth daily.  90 capsule  3  . promethazine (PHENERGAN) 25 MG tablet Take 25 mg by mouth every 6 (six) hours as needed for nausea. For nausea      . rifaximin (XIFAXAN) 550 MG TABS tablet Take 550 mg by mouth 2 (two) times daily.      . traZODone (DESYREL) 100 MG tablet Take 2 tablets (200 mg total) by mouth at bedtime.  180 tablet  3  . [EXPIRED] HYDROmorphone (DILAUDID) 4 MG tablet Take 2-8 mg by mouth every 6 (six) hours as needed for moderate pain.      . Insulin Pen Needle (BD PEN NEEDLE NANO U/F) 32G X 4 MM MISC Use 4 times daily as directed.  150 each  3  . Insulin Pen Needle (EASY TOUCH PEN NEEDLES) 32G X 4 MM MISC Use as directed, diagnosis code is 250.00  100 each  3    Home: Home Living Family/patient expects to be discharged to:: Private residence Living Arrangements: Children;Other relatives Available Help at Discharge: Available 24 hours/day (daughter-in-law) Type of Home: House Home Access: Stairs to enter [employer] of Steps: 3 Entrance Stairs-Rails: Right;Left (pt. reports they are "wobbly") Home Layout: One level Home Equipment: Cane - single point;Walker - 2 wheels   Functional History:    Functional Status:  Mobility:min to mod assist for basic mobility      Ambulation/Gait Ambulation Distance (Feet): 40 Feet Gait velocity: decreased General Gait Details: improved confidence with RW/KI/WBAT with frequent instructional and occasional deomstrational cues for sequencing, use of RW to WBAT and to increase right step length to approach step-to  and step-through pattern.  needs encouragement and confidence due to fear of falling; optimistic and able to increase speed minimally with practice    ADL:    Cognition: Cognition Overall Cognitive Status: Within Functional Limits for tasks assessed Orientation Level: Oriented X4 Cognition Arousal/Alertness: Awake/alert Behavior During Therapy: WFL for tasks assessed/performed;Anxious Overall Cognitive Status: Within Functional Limits for tasks assessed  Physical Exam: Blood pressure 138/58, pulse 86, temperature 98.3 F (36.8 C), temperature source Oral, resp. rate 20, height 5' (1.524 m), weight 81.9 kg (180 lb 8.9 oz), SpO2 98.00%. Physical Exam  Nursing note and vitals reviewed.  Constitutional: She is oriented to person, place, and time.  HENT: dentition poor, oral mucosa pink and moist Head: Normocephalic.  Eyes: EOM are normal.  Neck: Normal range of motion. Neck supple. No thyromegaly present.  Cardiovascular: Normal rate and regular rhythm.  Respiratory: Effort normal and breath sounds normal. No respiratory distress.  GI: Soft. Bowel sounds are normal. She exhibits no distension.  Colostomy tube in place, no leaks. Stool with expected form Neurological: She is alert and oriented to person, place, and time.  Skin:  Knee immobilizer left lower extremity  motor strength 5/5 in bilateral deltoid, bicep, tricep, grip,  4/5 in the right hip flexor, ksssssssnot tested secondary to pain left ankle dorsiflexor plantar flexor 3 minus secondary to pain  Knee immobilizer in place  Extremity no evidence of calf edema on the right side   Left Knee--incision clean and dry with staples in place. Incision with central erythema with dark center (bruise) noted. Hypersensitive to touch. Minimal edema.    Results for orders placed during the hospital encounter of 04/21/13 (from the past 48 hour(s))  GLUCOSE, CAPILLARY     Status: Abnormal   Collection Time    04/24/13 11:41 AM       Result Value Ref Range   Glucose-Capillary 176 (*) 70 - 99 mg/dL  Comment 1 Notify RN    GLUCOSE, CAPILLARY     Status: Abnormal   Collection Time    04/24/13  5:43 PM      Result Value Ref Range   Glucose-Capillary 195 (*) 70 - 99 mg/dL   Comment 1 Notify RN    GLUCOSE, CAPILLARY     Status: Abnormal   Collection Time    04/24/13  9:39 PM      Result Value Ref Range   Glucose-Capillary 170 (*) 70 - 99 mg/dL   Comment 1 Notify RN    GLUCOSE, CAPILLARY     Status: Abnormal   Collection Time    04/25/13  7:54 AM      Result Value Ref Range   Glucose-Capillary 131 (*) 70 - 99 mg/dL   Comment 1 Notify RN    GLUCOSE, CAPILLARY     Status: Abnormal   Collection Time    04/25/13  8:22 AM      Result Value Ref Range   Glucose-Capillary 126 (*) 70 - 99 mg/dL  GLUCOSE, CAPILLARY     Status: Abnormal   Collection Time    04/25/13 12:00 PM      Result Value Ref Range   Glucose-Capillary 164 (*) 70 - 99 mg/dL  GLUCOSE, CAPILLARY     Status: Abnormal   Collection Time    04/25/13  5:05 PM      Result Value Ref Range   Glucose-Capillary 193 (*) 70 - 99 mg/dL  GLUCOSE, CAPILLARY     Status: Abnormal   Collection Time    04/25/13  9:38 PM      Result Value Ref Range   Glucose-Capillary 174 (*) 70 - 99 mg/dL   Comment 1 Notify RN    GLUCOSE, CAPILLARY     Status: Abnormal   Collection Time    04/26/13  7:49 AM      Result Value Ref Range   Glucose-Capillary 122 (*) 70 - 99 mg/dL   No results found.  Post Admission Physician Evaluation: 1. Functional deficits secondary  to left patella fx after all. 2. Patient is admitted to receive collaborative, interdisciplinary care between the physiatrist, rehab nursing staff, and therapy team. 3. Patient's level of medical complexity and substantial therapy needs in context of that medical necessity cannot be provided at a lesser intensity of care such as a SNF. 4. Patient has experienced substantial functional loss from his/her baseline  which was documented above under the "Functional History" and "Functional Status" headings.  Judging by the patient's diagnosis, physical exam, and functional history, the patient has potential for functional progress which will result in measurable gains while on inpatient rehab.  These gains will be of substantial and practical use upon discharge  in facilitating mobility and self-care at the household level. 5. Physiatrist will provide 24 hour management of medical needs as well as oversight of the therapy plan/treatment and provide guidance as appropriate regarding the interaction of the two. 6. 24 hour rehab nursing will assist with bladder management, bowel management, safety, skin/wound care, disease management, medication administration, pain management and patient education  and help integrate therapy concepts, techniques,education, etc. 7. PT will assess and treat for/with: Lower extremity strength, range of motion, stamina, balance, functional mobility, safety, adaptive techniques and equipment, ortho precuations, safety, pain mgt.   Goals are: mod i. 8. OT will assess and treat for/with: ADL's, functional mobility, safety, upper extremity strength, adaptive techniques and equipment, pain mgt, ortho precautions, education.  Goals are: mod I to set up. 9. SLP will assess and treat for/with: n/a.  Goals are: n/a. 10. Case Management and Social Worker will assess and treat for psychological issues and discharge planning. 11. Team conference will be held weekly to assess progress toward goals and to determine barriers to discharge. 12. Patient will receive at least 3 hours of therapy per day at least 5 days per week. 13. ELOS: 11-17 days       14. Prognosis:  excellent   Medical Problem List and Plan: Displaced Left patellar fracture secondary to fall 04/21/2013  1. DVT Prophylaxis/Anticoagulation: Pharmaceutical: Lovenox 2. Chronic abdominal pain/Pain Management:  Continue oxycodone and   Bentlyl for chronic pain. Will add neurontin  for dysesthesias left knee.  3. Anxiety disorder/ Mood: Provide ego support for high levels of anxiety. This will hopefully improve with time, ego support and ongoing therapy. LCSW to follow for evaluation and support.  4. Neuropsych: This patient is capable of making decisions on her own behalf. 5. NSCL cancer/ COPD: Oxygen with activity and at nights. Continue Dulera.  6. DM type 2: Will monitor with bid checks. Continue lantus 35 units bid with SSI for elevated BS.  7. Drug induced Liver cirrhosis:  On rifaximin. GI symptoms stable but with poor intake. Will add supplements with meals.  8. Chronic Leucocytosis:  Trend 18.2--->17.5-->19.3. No fevers. No signs of infection---recheck in am.  9. Anemia of chronic disease: stable.  Will recheck in am.  10. Wound care: pt proficient in ostomy care- follow for leaks or issues  -staples in left knee- clean and intact. Change daily or as needed   Meredith Staggers, MD, [facility]   04/26/2013

## 2013-04-26 NOTE — Progress Notes (Signed)
   Subjective: 5 Days Post-Op   left patella fracture Pt c/o mild to moderate pain at times but feels like it is getting better Plan for admission to CIR today Remains in knee immobilizer In good spirits today  Patient reports pain as moderate.  Objective:   VITALS:   Filed Vitals:   04/26/13 1215  BP: 123/41  Pulse: 90  Temp: 98.3 F (36.8 C)  Resp: 18    Left knee dressing intact nv intact distally No rashes or edema Pt kept in full leg extension  LABS  Recent Labs  04/24/13 0503  HGB 11.4*  HCT 35.5*  WBC 19.3*  PLT 425*     Recent Labs  04/24/13 0503  NA 134*  K 4.8  BUN 11  CREATININE 0.83  GLUCOSE 146*     Assessment/Plan: 5 Days Post-Op   Keep left leg in extension at all times Transfer to CIR today Pain control as needed F/u with Dr. Veverly Fells in 2 weeks    Merla Riches, MPAS, PA-C  04/26/2013, 12:58 PM

## 2013-04-27 ENCOUNTER — Inpatient Hospital Stay (HOSPITAL_COMMUNITY): Payer: Medicare Other

## 2013-04-27 ENCOUNTER — Ambulatory Visit: Payer: Medicare Other

## 2013-04-27 ENCOUNTER — Inpatient Hospital Stay (HOSPITAL_COMMUNITY): Payer: Medicare Other | Admitting: *Deleted

## 2013-04-27 ENCOUNTER — Inpatient Hospital Stay (HOSPITAL_COMMUNITY): Payer: Medicare Other | Admitting: Physical Therapy

## 2013-04-27 ENCOUNTER — Encounter (HOSPITAL_COMMUNITY): Payer: Self-pay | Admitting: Orthopedic Surgery

## 2013-04-27 DIAGNOSIS — S82009A Unspecified fracture of unspecified patella, initial encounter for closed fracture: Secondary | ICD-10-CM

## 2013-04-27 DIAGNOSIS — W19XXXA Unspecified fall, initial encounter: Secondary | ICD-10-CM

## 2013-04-27 LAB — CBC WITH DIFFERENTIAL/PLATELET
Basophils Absolute: 0.1 10*3/uL (ref 0.0–0.1)
Basophils Relative: 1 % (ref 0–1)
Eosinophils Absolute: 2.1 10*3/uL — ABNORMAL HIGH (ref 0.0–0.7)
Eosinophils Relative: 12 % — ABNORMAL HIGH (ref 0–5)
HCT: 33.8 % — ABNORMAL LOW (ref 36.0–46.0)
Hemoglobin: 10.9 g/dL — ABNORMAL LOW (ref 12.0–15.0)
LYMPHS ABS: 5 10*3/uL — AB (ref 0.7–4.0)
LYMPHS PCT: 29 % (ref 12–46)
MCH: 27.7 pg (ref 26.0–34.0)
MCHC: 32.2 g/dL (ref 30.0–36.0)
MCV: 85.8 fL (ref 78.0–100.0)
Monocytes Absolute: 2.8 10*3/uL — ABNORMAL HIGH (ref 0.1–1.0)
Monocytes Relative: 16 % — ABNORMAL HIGH (ref 3–12)
NEUTROS PCT: 42 % — AB (ref 43–77)
Neutro Abs: 7.2 10*3/uL (ref 1.7–7.7)
PLATELETS: 447 10*3/uL — AB (ref 150–400)
RBC: 3.94 MIL/uL (ref 3.87–5.11)
RDW: 15.7 % — ABNORMAL HIGH (ref 11.5–15.5)
WBC: 17.2 10*3/uL — AB (ref 4.0–10.5)

## 2013-04-27 LAB — GLUCOSE, CAPILLARY
GLUCOSE-CAPILLARY: 173 mg/dL — AB (ref 70–99)
Glucose-Capillary: 133 mg/dL — ABNORMAL HIGH (ref 70–99)
Glucose-Capillary: 184 mg/dL — ABNORMAL HIGH (ref 70–99)
Glucose-Capillary: 171 mg/dL — ABNORMAL HIGH (ref 70–99)

## 2013-04-27 LAB — URINALYSIS, ROUTINE W REFLEX MICROSCOPIC
BILIRUBIN URINE: NEGATIVE
Glucose, UA: NEGATIVE mg/dL
Hgb urine dipstick: NEGATIVE
KETONES UR: NEGATIVE mg/dL
NITRITE: NEGATIVE
PH: 6 (ref 5.0–8.0)
Protein, ur: 30 mg/dL — AB
Specific Gravity, Urine: 1.013 (ref 1.005–1.030)
UROBILINOGEN UA: 0.2 mg/dL (ref 0.0–1.0)

## 2013-04-27 LAB — COMPREHENSIVE METABOLIC PANEL
ALK PHOS: 137 U/L — AB (ref 39–117)
ALT: 20 U/L (ref 0–35)
AST: 28 U/L (ref 0–37)
Albumin: 2.5 g/dL — ABNORMAL LOW (ref 3.5–5.2)
BUN: 22 mg/dL (ref 6–23)
CHLORIDE: 99 meq/L (ref 96–112)
CO2: 20 meq/L (ref 19–32)
Calcium: 8.8 mg/dL (ref 8.4–10.5)
Creatinine, Ser: 1.06 mg/dL (ref 0.50–1.10)
GFR calc Af Amer: 64 mL/min — ABNORMAL LOW (ref >90)
GFR, EST NON AFRICAN AMERICAN: 55 mL/min — AB (ref >90)
Glucose, Bld: 147 mg/dL — ABNORMAL HIGH (ref 70–99)
POTASSIUM: 5.2 meq/L (ref 3.7–5.3)
SODIUM: 133 meq/L — AB (ref 137–147)
Total Bilirubin: 0.4 mg/dL (ref 0.3–1.2)
Total Protein: 6.8 g/dL (ref 6.0–8.3)

## 2013-04-27 LAB — URINE MICROSCOPIC-ADD ON

## 2013-04-27 MED ORDER — ALPRAZOLAM 0.25 MG PO TABS
0.2500 mg | ORAL_TABLET | Freq: Three times a day (TID) | ORAL | Status: DC | PRN
  Administered 2013-04-27 – 2013-04-29 (×4): 0.25 mg via ORAL
  Filled 2013-04-27 (×4): qty 1

## 2013-04-27 NOTE — Discharge Instructions (Signed)
Inpatient Rehab Discharge Instructions  OKTOBER GLAZER Discharge date and time:    Activities/Precautions/ Functional Status: Activity: activity as tolerated Diet: diabetic diet Wound Care: keep wound clean and dry Functional status:  ___ No restrictions     ___ Walk up steps independently ___ 24/7 supervision/assistance   ___ Walk up steps with assistance ___ Intermittent supervision/assistance  ___ Bathe/dress independently ___ Walk with walker     ___ Bathe/dress with assistance ___ Walk Independently    ___ Shower independently ___ Walk with assistance    ___ Shower with assistance ___ No alcohol     ___ Return to work/school ________  Special Instructions:    My questions have been answered and I understand these instructions. I will adhere to these goals and the provided educational materials after my discharge from the hospital.  Patient/Caregiver Signature _______________________________ Date __________  Clinician Signature _______________________________________ Date __________  Please bring this form and your medication list with you to all your follow-up doctor's appointments.

## 2013-04-27 NOTE — Progress Notes (Signed)
Patient information reviewed and entered into eRehab system by Baylynn Shifflett, RN, CRRN, PPS Coordinator.  Information including medical coding and functional independence measure will be reviewed and updated through discharge.     Per nursing patient was given "Data Collection Information Summary for Patients in Inpatient Rehabilitation Facilities with attached "Privacy Act Statement-Health Care Records" upon admission.  

## 2013-04-27 NOTE — Progress Notes (Signed)
[place] PHYS[facility] MEDICINE & REHABILITATION     PROGRESS NOTE    Subjective/Complaints: A little SOB this am. No major problems last night. Pain seems to be under control  Objective: Vital Signs: Blood pressure 124/44, pulse 80, temperature 98.4 F (36.9 C), temperature source Oral, resp. rate 18, SpO2 97.00%. No results found.  Recent Labs  04/27/13 0640  WBC 17.2*  HGB 10.9*  HCT 33.8*  PLT 447*   No results found for this basename: NA, K, CL, CO, GLUCOSE, BUN, CREATININE, CALCIUM,  in the last 72 hours CBG (last 3)   Recent Labs  04/26/13 1710 04/26/13 2121 04/27/13 0736  GLUCAP 239* 154* 133*    Wt Readings from Last 3 Encounters:  04/22/13 81.9 kg (180 lb 8.9 oz)  04/22/13 81.9 kg (180 lb 8.9 oz)  04/13/13 81.965 kg (180 lb 11.2 oz)    Physical Exam:  Constitutional: She is oriented to person, place, and time.  HENT: dentition poor, oral mucosa pink and moist  Head: Normocephalic.  Eyes: EOM are normal.  Neck: Normal range of motion. Neck supple. No thyromegaly present.  Cardiovascular: Normal rate and regular rhythm.  Respiratory: Effort normal and breath sounds normal. No respiratory distress. No wheezes GI: Soft. Bowel sounds are normal. She exhibits no distension.  Colostomy tube in place, no leaks. Stool with expected form Neurological: She is alert and oriented to person, place, and time.  Skin:  Knee immobilizer left lower extremity  motor strength 5/5 in bilateral deltoid, bicep, tricep, grip,  4/5 in the right hip flexor, ksssssssnot tested secondary to pain left ankle dorsiflexor plantar flexor 3 minus secondary to pain  Knee immobilizer fitting appropriately Extremity no evidence of calf edema on the right side  Left Knee--incision clean and dry with staples in place. Incision with central erythema with dark center (bruise) noted. Hypersensitive to touch. Minimal edema.  Psych: a little anxious, pleasant   Assessment/Plan: 1. Functional  deficits secondary to left patellar fx which require 3+ hours per day of interdisciplinary therapy in a comprehensive inpatient rehab setting. Physiatrist is providing close team supervision and 24 hour management of active medical problems listed below. Physiatrist and rehab team continue to assess barriers to discharge/monitor patient progress toward functional and medical goals. FIM:                                  Medical Problem List and Plan:  Displaced Left patellar fracture secondary to fall 04/21/2013  1. DVT Prophylaxis/Anticoagulation: Pharmaceutical: Lovenox . Check dopplers 2. Chronic abdominal pain/Pain Management: Continue oxycodone and Bentlyl for chronic pain. Added neurontin for dysesthesias left knee.  3. Anxiety disorder/ Mood: Provide ego support for high levels of anxiety. This will hopefully improve with time, ego support and ongoing therapy. LCSW to follow for evaluation and support.  4. Neuropsych: This patient is capable of making decisions on her own behalf.  5. NSCL cancer/ COPD: Oxygen with activity and at nights. Continue Dulera. Exam stable today--some anxiety 6. DM type 2: Will monitor with bid checks. Continue lantus 35 units bid with SSI for elevated BS.   -follow for pattern before adjusting regimen 7. 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 GI symptoms stable but with poor intake. Will add supplements with meals.  8. Chronic Leucocytosis: Trend 18.2--->17.5-->19.3 and back to 17.2 today---continue to follow clinically 9. Anemia of chronic disease: stable. hgb 10.9 10. Wound care: pt proficient in  ostomy care- no problems or leaks now -staples in left knee- clean and intact. Change daily or as needed  LOS (Days) 1 A FACE TO FACE EVALUATION WAS PERFORMED  Annalysa Mohammad T 04/27/2013 8:07 AM

## 2013-04-27 NOTE — Evaluation (Signed)
Occupational Therapy Assessment and Plan  Patient Details  Name: [name] MRN: [id number] Date of Birth: [birth year]  OT Diagnosis: muscle weakness (generalized) and pain in joint Rehab Potential: Rehab Potential: Excellent ELOS: 7-10 days   Today's Date: 04/27/2013 Time: 7342-8768 Time Calculation (min): 60 min  Problem List:  Patient Active Problem List   Diagnosis Date Noted  . Patellar fracture 04/21/2013  . Left patella fracture 04/21/2013  . Hypercalcemia 03/30/2013  . Pneumonia, organism unspecified 03/01/2013  . Splenomegaly 02/24/2013  . HHNC (hyperglycemic hyperosmolar nonketotic coma) 11/14/2012  . AKI (acute kidney injury) 11/14/2012  . Unspecified deficiency anemia 03/23/2012  . Acute GI bleeding 03/17/2011  . Chronic respiratory failure 03/17/2011  . Cirrhosis of liver not due to alcohol 03/17/2011  . Thrombocytopenia 03/17/2011  . Anemia 03/17/2011  . Irritable bowel syndrome 03/17/2011  . Hx of cervical cancer 03/17/2011  . CARCINOMA, LUNG, SQUAMOUS CELL 02/26/2007  . DEPRESSION 02/26/2007  . HYPERTENSION 02/26/2007  . COPD 02/26/2007  . Type II or unspecified type diabetes mellitus with neurological manifestations, uncontrolled 09/15/2006  . GERD 09/15/2006  . HEADACHE 09/15/2006  . COLONIC POLYPS, HX OF 09/15/2006    Past Medical History:  Past Medical History  Diagnosis Date  . Cirrhosis   . GERD (gastroesophageal reflux disease)   . Cervical disc syndrome   . Lung cancer     squamous cell  . Chronic respiratory failure   . Diverticulitis of colon   . Perforation of colon   . Arthritis   . IBS (irritable bowel syndrome)   . Chronic lower GI bleeding   . Overactive bladder   . Cervical cancer   . Thrombocytopenia     sees Dr. Julien Nordmann   . Splenomegaly   . Anxiety   . Depression   . Hypertension   . Asthma   . Chronic headache disorder   . Blood transfusion without reported diagnosis   . Heart murmur   . Hyperlipidemia   . Lung  cancer   . COPD (chronic obstructive pulmonary disease)     sees Dr. Gwenette Greet   . Anemia     sees Dr. Julien Nordmann, due to chronic disease and GI losses   . Diabetes mellitus     sees Dr. Cruzita Lederer   . Colostomy care   . Hypercalcemia    Past Surgical History:  Past Surgical History  Procedure Laterality Date  . Appendectomy    . Cholecystectomy    . Colostomy    . Pneumonectomy    . Cervix removed    . Bowel resection    . Esophagogastroduodenoscopy  03/19/2011    Procedure: ESOPHAGOGASTRODUODENOSCOPY (EGD);  Surgeon: [name], MD;  Location: [facility];  Service: Endoscopy;  Laterality: N/A;  . Colonoscopy  03/19/2011    Procedure: COLONOSCOPY;  Surgeon: [name], MD;  Location: [facility];  Service: Endoscopy;  Laterality: N/A;  . Givens capsule study  03/20/2011    Procedure: GIVENS CAPSULE STUDY;  Surgeon: [name], MD;  Location: [facility];  Service: Endoscopy;  Laterality: N/A;  . Small bowel obstruction repair  March 2012  . Umbilical hernia repair  March 2012  . Splenectomy, total N/A 02/24/2013    Procedure: SPLENECTOMY;  Surgeon: Harl Bowie, MD;  Location: [facility];  Service: General;  Laterality: N/A;  . Orif patella fracture Left 04/21/2013    DR [name]    Assessment & Plan Clinical Impression: Patient is a 63 y.o. 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 She was admitted 04/21/2013 after mechanical fall without loss of consciousness. X-rays and imaging revealed comminuted left patella fracture with hemarthrosis and she underwent ORIF left patella on 04/22/2013 per Dr. Veverly Fells. Is WBAT with knee immobilizer at all times--No ROM left knee. She has had problems with chronic abdominal pain as well as Left knee pian and anxiety with mobility.  Intermittent blood pressures elevation noted likely due to pain and currently being monitored. Therapies initiated and CIR recommended by rehab team.   Patient transferred to CIR on 04/26/2013 .    Patient currently requires overall mod-max assist with basic self-care skills secondary to muscle weakness and muscle joint tightness and decreased cardiorespiratoy endurance.  Prior to hospitalization, patient could complete BADL with modified independence.  Patient will benefit from skilled intervention to increase independence with basic self-care skills prior to discharge home with care partner.  Anticipate patient will require minimal physical assistance and follow up home health.  OT - End of Session Endurance Deficit: Yes OT Assessment Rehab Potential: Excellent OT Patient demonstrates impairments in the following area(s): Balance;Endurance;Pain;Safety OT Basic ADL's Functional Problem(s): Bathing;Dressing;Toileting OT Transfers Functional Problem(s): Toilet;Tub/Shower OT Plan OT Intensity: Minimum of 1-2 x/day, 45 to 90 minutes OT Frequency: 5 out of 7 days OT Duration/Estimated Length of Stay: 7-10 days OT Treatment/Interventions: Discharge planning;Pain management;Self Care/advanced ADL retraining;Therapeutic Activities;Therapeutic Exercise;UE/LE Strength taining/ROM;Functional mobility training;DME/adaptive equipment instruction OT Self Feeding Anticipated Outcome(s): Independent OT Basic Self-Care Anticipated Outcome(s): Min A OT Toileting Anticipated Outcome(s): Mod I OT Bathroom Transfers Anticipated Outcome(s): Mod I OT Recommendation Patient destination: Home Follow Up Recommendations: None Equipment Recommended: To be determined   Skilled Therapeutic Intervention 1:1 OT initial evaluation completed with skilled intervention provided emphasizing adapted bathing/dressing skills, sit >< stand, functional mobility, pain management, endurance, and functional transfers.   Patient  elected to perform bathing/dressing at sink, sitting and standing, which required close supervision and setup assist to manage R-LE.   Patient was unable to dress lower body due to weakness and limited time due to need for rest break and instruction on adapted bathing skills.  OT Evaluation Precautions/Restrictions  Precautions Precautions: Fall Required Braces or Orthoses: Knee Immobilizer - Left Knee Immobilizer - Left: On at all times Restrictions Weight Bearing Restrictions: Yes LLE Weight Bearing: Weight bearing as tolerated Other Position/Activity Restrictions: NO ROM LEFT KNEE; quad sets and ankle pumps OK  General Chart Reviewed: Yes Family/Caregiver Present: No  Vital Signs Oxygen Therapy SpO2: 95 % (during PT ) O2 Device: None (Room air) O2 Flow Rate (L/min): 2 L/min  Pain Pain Assessment Pain Score: 7  Pain Type: Surgical pain Pain Location: Knee Pain Orientation: Left Pain Descriptors / Indicators: Aching Pain Onset: On-going Pain Intervention(s): RN made aware  Home Living/Prior Functioning Home Living Available Help at Discharge: Available 24 hours/day (daughter-in-law) Type of Home: House Home Access: Stairs to enter Technical brewer of Steps: 3 Entrance Stairs-Rails: Right;Left (pt. reports they are "wobbly") Home Layout: One level Additional Comments: Pt could benefit from tub seat but states she cannot afford it and insurance will not pay for it.  Lives With: Family IADL History Homemaking Responsibilities: Yes Meal Prep Responsibility: No Laundry Responsibility: Primary Cleaning Responsibility: No Bill Paying/Finance Responsibility: Primary Shopping Responsibility: Secondary Child Care Responsibility: Secondary Current License: No Mode of Transportation: Car Education:  2 years college Occupation: On disability Type of Occupation: Therapist, art, [employer], 3 years Leisure and Hobbies: Word puzzles, paint by numbers, sewing  (makes pillows) Prior Function Level of Independence: Requires assistive device for independence  Able to Take Stairs?: Yes Driving: No Vocation: On disability  ADL ADL ADL Comments: see FIM  Vision/Perception  WFL, wears glasses for reading, no change from baseline vision    Cognition Overall Cognitive Status: Within Functional Limits for tasks assessed Arousal/Alertness: Awake/alert Orientation Level: Oriented X4 Attention: Selective Selective Attention: Appears intact Memory: Appears intact Awareness: Appears intact Problem Solving: Appears intact Safety/Judgment: Appears intact  Sensation Sensation Light Touch: Appears Intact (?impaired sensation in feet?) Proprioception: Appears Intact Coordination Gross Motor Movements are Fluid and Coordinated: Yes  Motor  Motor Motor: Within Functional Limits  Mobility  Bed Mobility Bed Mobility: Supine to Sit;Sit to Supine Supine to Sit: 4: Min assist;HOB flat;With rails Supine to Sit Details (indicate cue type and reason): Pt initially requesting help of therapist. Pt demo inefficient transition, needed assist for Lt LE   Trunk/Postural Assessment  Cervical Assessment Cervical Assessment: Within Functional Limits Thoracic Assessment Thoracic Assessment: Within Functional Limits Lumbar Assessment Lumbar Assessment: Within Functional Limits Postural Control Postural Control: Deficits on evaluation Righting Reactions: impaired   Balance Balance Balance Assessed: Yes Dynamic Standing Balance Dynamic Standing - Balance Support: Bilateral upper extremity supported Dynamic Standing - Level of Assistance: 4: Min assist Dynamic Standing - Balance Activities: Lateral lean/weight shifting;Reaching for objects  Extremity/Trunk Assessment RUE Assessment RUE Assessment: Within Functional Limits LUE Assessment LUE Assessment: Within Functional Limits  FIM:  FIM - Eating Eating Activity: 7: Complete independence:no  helper FIM - Grooming Grooming Steps: Wash, rinse, dry face;Wash, rinse, dry hands;Oral care, brush teeth, clean dentures;Brush, comb hair Grooming: 7:Complete independence: no helper FIM - Bathing Bathing Steps Patient Completed: Chest;Right Arm;Left Arm;Abdomen;Front perineal area Bathing: 3: Mod-Patient completes 5-7 [id number] parts or 50-74% FIM - Upper Body Dressing/Undressing Upper body dressing/undressing steps patient completed: Thread/unthread right bra strap;Thread/unthread left bra strap;Thread/unthread right sleeve of pullover shirt/dresss;Thread/unthread left sleeve of pullover shirt/dress;Put head through opening of pull over shirt/dress;Pull shirt over trunk Upper body dressing/undressing: 4: Min-Patient completed 75 plus % of tasks FIM - Lower Body Dressing/Undressing Lower body dressing/undressing: 1: Total-Patient completed less than 25% of tasks FIM - Toileting Toileting steps completed by patient: Adjust clothing prior to toileting;Performs perineal hygiene;Adjust clothing after toileting Toileting: 5: Supervision: Safety issues/verbal cues FIM - TRadio producerDevices: Bedside commode;Walker Toilet Transfers: 4-To toilet/BSC: Min A (steadying Pt. > 75%);5-From toilet/BSC: Supervision (verbal cues/safety issues)   Refer to Care Plan for Long Term Goals  Recommendations for other services: None  Discharge Criteria: Patient will be discharged from OT if patient refuses treatment 3 consecutive times without medical reason, if treatment goals not met, if there is a change in medical status, if patient makes no progress towards goals or if patient is discharged from hospital.  The above assessment, treatment plan, treatment alternatives and goals were discussed and mutually agreed upon: by patient  Second session: Time: 1330-1415 Time Calculation (min): 45 min  Pain Assessment: 7/10, left knee, RN made aware  Skilled Therapeutic Interventions:  ADL-retraining with focus on tub transfers, AE training, dynamic sitting balance and w/c mobility.   After 1 demonstration, patient completed transfer to tub bench in standard tub (@ ADL apartment) with min assist to position/support RLE.   Patient educated on use of additional AE: LH sponge and sock  aid.  See FIM for current functional status  Therapy/Group: Individual Therapy  [place] 04/27/2013, 12:29 PM

## 2013-04-27 NOTE — Progress Notes (Signed)
Inpatient Diabetes Program Recommendations  AACE/ADA: New Consensus Statement on Inpatient Glycemic Control (2013)  Target Ranges:  Prepandial:   less than 140 mg/dL      Peak postprandial:   less than 180 mg/dL (1-2 hours)      Critically ill patients:  140 - 180 mg/dL    Results for [name], [name] (MRN [id number]) as of 04/27/2013 08:05  Ref. Range 04/26/2013 07:49 04/26/2013 12:13 04/26/2013 17:10 04/26/2013 21:21  Glucose-Capillary Latest Range: 70-99 mg/dL 122 (H) 236 (H) 239 (H) 154 (H)     Home DM Meds: Lantus 60 units bid Humalog 47 units w/ breakfast/ 35 units w/ lunch/ 45 units w/ supper Metformin 1000 mg bid   **Patient currently receiving Lantus 35 units bid + Novolog Moderate SSi tid ac + HS  **Fasting glucose well controlled on current dose of Lantus. Would Not recommend adjusting Lantus at this time.  **Postpandial CBGs slightly elevated.  Patient may benefit from low dose Novolog meal coverage   MD- Please consider starting Novolog 3 units tid with meals (hold if pt NPO, hold if pt eats <50% of meal)   Will follow. Wyn Quaker RN, MSN, CDE Diabetes Coordinator Inpatient Diabetes Program Team Pager: [phone] (8a-10p)

## 2013-04-27 NOTE — IPOC Note (Signed)
Overall Plan of Care [facility]) Patient Details Name: MAHOGONY GILCHREST MRN: [id number] DOB: 1950-11-15  Admitting Diagnosis: [facility] Problems: Active Problems:   Left patella fracture     Functional Problem List: Nursing Bladder;Bowel;Endurance;Medication Management;Nutrition;Pain;Safety;Skin Integrity  PT Balance;Endurance;Motor;Pain;Safety;Sensory;Skin Integrity  OT Balance;Endurance;Pain;Safety  SLP    TR         Basic ADL's: OT Bathing;Dressing;Toileting     Advanced  ADL's: OT       Transfers: PT Bed Mobility;Bed to Chair;Car;Furniture;Floor  OT Toilet;Tub/Shower     Locomotion: PT Ambulation;Wheelchair Mobility;Stairs     Additional Impairments: OT    SLP        TR      Anticipated Outcomes Item Anticipated Outcome  Self Feeding Independent  Swallowing      Basic self-care  Min A  Toileting  Mod I   Bathroom Transfers Mod I  Bowel/Bladder  modified independent with bladder, independent with colostomy care  Transfers  mod I  Locomotion  supervision  Communication     Cognition     Pain  3 or less on scale of 1-10  Safety/Judgment  supervision   Therapy Plan: PT Intensity: Minimum of 1-2 x/day ,45 to 90 minutes PT Frequency: 5 out of 7 days PT Duration Estimated Length of Stay: 7-10 days OT Intensity: Minimum of 1-2 x/day, 45 to 90 minutes OT Frequency: 5 out of 7 days OT Duration/Estimated Length of Stay: 7-10 days         Team Interventions: Nursing Interventions Patient/Family Education;Bladder Management;Bowel Management;Disease Management/Prevention;Pain Management;Medication Management;Skin Care/Wound Management;Discharge Planning;Psychosocial Support  PT interventions Ambulation/gait training;Balance/vestibular training;Discharge planning;DME/adaptive equipment instruction;Functional mobility training;Patient/family education;Pain management;Neuromuscular re-education;Psychosocial support;Stair training;UE/LE Strength  taining/ROM;Therapeutic Activities;Therapeutic Exercise;Splinting/orthotics;Wheelchair propulsion/positioning  OT Interventions Discharge planning;Pain management;Self Care/advanced ADL retraining;Therapeutic Activities;Therapeutic Exercise;UE/LE Strength taining/ROM;Functional mobility training;DME/adaptive equipment instruction  SLP Interventions    TR Interventions    SW/CM Interventions      Team Discharge Planning: Destination: PT-Home ,OT- Home , SLP-  Projected Follow-up: PT-Home health PT;24 hour supervision/assistance, OT-  None, SLP-  Projected Equipment Needs: PT-To be determined, OT- To be determined, SLP-  Equipment Details: PT- , OT-  Patient/family involved in discharge planning: PT- Patient,  OT-Patient, SLP-   MD ELOS: 7-10 days Medical Rehab Prognosis:  Excellent Assessment: The patient has been admitted for CIR therapies. The team will be addressing, functional mobility, strength, stamina, balance, safety, adaptive techniques/equipment, self-care, bowel and bladder mgt, patient and caregiver education, pain mgt, ortho precautions, ego-support. Goals have been set at mod I for mobility and ADL's except self-care (min assist).    Meredith Staggers, MD, FAAPMR      See Team Conference Notes for weekly updates to the plan of care

## 2013-04-27 NOTE — Progress Notes (Signed)
Physical Therapy Session Note  Patient Details  Name: [name] MRN: [id number] Date of Birth: November 09, 1950  Today's Date: 04/27/2013 Time: 3009-2330 Time Calculation (min): 35 min   Skilled Therapeutic Interventions/Progress Updates:  Tx focused on functional mobility, transfers, and WC mobility.  Pt fatigued from the day's therapies, but agreeable.  Performed multiple stand-step transfers with min/mod A WC<>WC and WC>bed.  Pt able to propel WC 2x45' with min A around tight spaces, limited by fatigue.  Set-up new WC for pt for more functional fit for ease of transfers.  Sit>supine with Min A for LE lifting. Pt encouraged to get OOB after resting for increased activity tolerance.      Therapy Documentation Precautions:  Precautions Precautions: Fall Required Braces or Orthoses: Knee Immobilizer - Left Knee Immobilizer - Left: On at all times (may undo during ADL) Restrictions Weight Bearing Restrictions: Yes LLE Weight Bearing: Weight bearing as tolerated Other Position/Activity Restrictions: NO ROM LEFT KNEE; quad sets and ankle pumps OK    Vital Signs: Therapy Vitals Temp: 97.5 F (36.4 C) Temp src: Oral Pulse Rate: 63 Resp: 18 BP: 138/52 mmHg Patient Position, if appropriate: Lying Oxygen Therapy SpO2: 97 % O2 Device: Nasal cannula  Pain: 9/10 - nursing aware and will provide next scheduled meds  See FIM for current functional status  Therapy/Group: Individual Therapy Kennieth Rad, PT, DPT  04/27/2013, 4:06 PM

## 2013-04-27 NOTE — Evaluation (Signed)
Physical Therapy Assessment and Plan  Patient Details  Name: [name] MRN: [id number] Date of Birth: 1950/10/07  PT Diagnosis: Abnormality of gait, Difficulty walking and Muscle weakness Rehab Potential: Good ELOS: 7-10 days   Today's Date: 04/27/2013 Time: 0730-0830 Time Calculation (min): 60 min  Problem List:  Patient Active Problem List   Diagnosis Date Noted  . Patellar fracture 04/21/2013  . Left patella fracture 04/21/2013  . Hypercalcemia 03/30/2013  . Pneumonia, organism unspecified 03/01/2013  . Splenomegaly 02/24/2013  . HHNC (hyperglycemic hyperosmolar nonketotic coma) 11/14/2012  . AKI (acute kidney injury) 11/14/2012  . Unspecified deficiency anemia 03/23/2012  . Acute GI bleeding 03/17/2011  . Chronic respiratory failure 03/17/2011  . Cirrhosis of liver not due to alcohol 03/17/2011  . Thrombocytopenia 03/17/2011  . Anemia 03/17/2011  . Irritable bowel syndrome 03/17/2011  . Hx of cervical cancer 03/17/2011  . CARCINOMA, LUNG, SQUAMOUS CELL 02/26/2007  . DEPRESSION 02/26/2007  . HYPERTENSION 02/26/2007  . COPD 02/26/2007  . Type II or unspecified type diabetes mellitus with neurological manifestations, uncontrolled 09/15/2006  . GERD 09/15/2006  . HEADACHE 09/15/2006  . COLONIC POLYPS, HX OF 09/15/2006    Past Medical History:  Past Medical History  Diagnosis Date  . Cirrhosis   . GERD (gastroesophageal reflux disease)   . Cervical disc syndrome   . Lung cancer     squamous cell  . Chronic respiratory failure   . Diverticulitis of colon   . Perforation of colon   . Arthritis   . IBS (irritable bowel syndrome)   . Chronic lower GI bleeding   . Overactive bladder   . Cervical cancer   . Thrombocytopenia     sees Dr. Julien Nordmann   . Splenomegaly   . Anxiety   . Depression   . Hypertension   . Asthma   . Chronic headache disorder   . Blood transfusion without reported diagnosis   . Heart murmur   . Hyperlipidemia   . Lung cancer   .  COPD (chronic obstructive pulmonary disease)     sees Dr. Gwenette Greet   . Anemia     sees Dr. Julien Nordmann, due to chronic disease and GI losses   . Diabetes mellitus     sees Dr. Cruzita Lederer   . Colostomy care   . Hypercalcemia    Past Surgical History:  Past Surgical History  Procedure Laterality Date  . Appendectomy    . Cholecystectomy    . Colostomy    . Pneumonectomy    . Cervix removed    . Bowel resection    . Esophagogastroduodenoscopy  03/19/2011    Procedure: ESOPHAGOGASTRODUODENOSCOPY (EGD);  Surgeon: [name], MD;  Location: [facility];  Service: Endoscopy;  Laterality: N/A;  . Colonoscopy  03/19/2011    Procedure: COLONOSCOPY;  Surgeon: [name], MD;  Location: [facility];  Service: Endoscopy;  Laterality: N/A;  . Givens capsule study  03/20/2011    Procedure: GIVENS CAPSULE STUDY;  Surgeon: [name], MD;  Location: [facility];  Service: Endoscopy;  Laterality: N/A;  . Small bowel obstruction repair  March 2012  . Umbilical hernia repair  March 2012  . Splenectomy, total N/A 02/24/2013    Procedure: SPLENECTOMY;  Surgeon: Harl Bowie, MD;  Location: [facility];  Service: General;  Laterality: N/A;  . Orif patella fracture Left 04/21/2013    DR [name]    Assessment & Plan Clinical Impression:A 63 y.o. 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 She was admitted 04/21/2013 after mechanical fall without loss of consciousness. X-rays and imaging revealed comminuted left patella fracture with hemarthrosis and she underwent ORIF left patella on 04/22/2013 per Dr. Veverly Fells. Is WBAT with knee immobilizer at all times--No ROM left knee. She has had problems with chronic abdominal pain as well as Left knee pian and anxiety with mobility. Intermittent blood pressures  elevation noted likely due to pain and currently being monitored. Patient transferred to CIR on 04/26/2013 .   Patient currently requires mod with mobility secondary to muscle weakness, decreased cardiorespiratoy endurance, impaired timing and sequencing and decreased standing balance and decreased balance strategies.  Prior to hospitalization, patient was modified independent  with mobility and lived with Family in a House home.  Home access is 3Stairs to enter.  Patient will benefit from skilled PT intervention to maximize safe functional mobility, minimize fall risk and decrease caregiver burden for planned discharge home with 24 hour supervision.  Anticipate patient will benefit from follow up [place] at discharge.  PT - End of Session Endurance Deficit: Yes PT Assessment Rehab Potential: Good Barriers to Discharge: Inaccessible home environment PT Patient demonstrates impairments in the following area(s): Balance;Endurance;Motor;Pain;Safety;Sensory;Skin Integrity PT Transfers Functional Problem(s): Bed Mobility;Bed to Chair;Car;Furniture;Floor PT Locomotion Functional Problem(s): Ambulation;Wheelchair Mobility;Stairs PT Plan PT Intensity: Minimum of 1-2 x/day ,45 to 90 minutes PT Frequency: 5 out of 7 days PT Duration Estimated Length of Stay: 7-10 days PT Treatment/Interventions: Ambulation/gait training;Balance/vestibular training;Discharge planning;DME/adaptive equipment instruction;Functional mobility training;Patient/family education;Pain management;Neuromuscular re-education;Psychosocial support;Stair training;UE/LE Strength taining/ROM;Therapeutic Activities;Therapeutic Exercise;Splinting/orthotics;Wheelchair propulsion/positioning PT Transfers Anticipated Outcome(s): mod I PT Locomotion Anticipated Outcome(s): supervision PT Recommendation Follow Up Recommendations: Home health PT;24 hour supervision/assistance Patient destination: Home Equipment Recommended: To be  determined  Skilled Therapeutic Intervention Pt on room air during mobility, SpO2>95% HR 55-66 bpm. Pt practiced sit <> stands, scoot, and stand pivot transfers with min/mod assist. Pt needs cues for Lt LE positioning for pain relief during mobility. Assist needed for lift and lower assist during transitions.   PT Evaluation Precautions/Restrictions Precautions Precautions: Fall Required Braces or Orthoses: Knee Immobilizer - Left Knee Immobilizer - Left: On at all times Restrictions Weight Bearing Restrictions: Yes LLE Weight Bearing: Weight bearing as tolerated Other Position/Activity Restrictions: NO ROM LEFT KNEE; quad sets and ankle pumps OK Pain Pain Assessment Pain Score: 7  Pain Type: Surgical pain Pain Location: Knee Pain Orientation: Left Pain Descriptors / Indicators: Aching Pain Onset: On-going Pain Intervention(s): RN made aware Home Living/Prior Functioning Home Living Available Help at Discharge: Available 24 hours/day (daughter-in-law) Type of Home: House Home Access: Stairs to enter Technical brewer of Steps: 3 Entrance Stairs-Rails: Right;Left (pt. reports they are "wobbly") Home Layout: One level Additional Comments: Pt could benefit from tub seat but states she cannot afford it and insurance will not pay for it.  Lives With: Family Prior Function Level of Independence: Requires assistive device for independence  Able to Take Stairs?: Yes Driving: No Vocation: On disability  Cognition Overall Cognitive Status: Within Functional Limits for tasks assessed Orientation Level: Oriented X4 Sensation Sensation Light Touch: Appears Intact (?impaired sensation in feet?) Proprioception: Appears Intact Coordination Gross Motor Movements are Fluid and Coordinated: Yes  Mobility Bed Mobility Bed Mobility: Supine to Sit;Sit to Supine Supine to Sit: 4: Min assist;HOB flat;With rails Supine to Sit Details (indicate cue type and reason):  Pt initially  requesting help of therapist. Pt demo inefficient transition, needed assist for Lt LE Transfers Transfers: Yes Stand Pivot Transfers: 4: Min assist Stand Pivot Transfer Details (indicate cue type and reason): with RW, cues for sequencing and safety Lateral/Scoot Transfers: 3: Mod assist Lateral/Scoot Transfer Details (indicate cue type and reason): Cues for sequencing, pt had difficulty scooting and did more of a stand Locomotion  Ambulation Ambulation: Yes Ambulation/Gait Assistance: 4: Min assist Ambulation Distance (Feet): 30 Feet Assistive device: Rolling walker Ambulation/Gait Assistance Details: Cues for sequencing, RW positioning, decreasing Lt LE adduction.  Gait Gait: Yes Gait Pattern: Impaired Gait Pattern: Step-to pattern;Shuffle;Trunk flexed Gait velocity: decreased Stairs / Additional Locomotion Stairs: Yes Stairs Assistance: 3: Mod assist Stairs Assistance Details (indicate cue type and reason): cues for sequencing Stair Management Technique: Two rails;Forwards Number of Stairs: 3 Architect: Yes Wheelchair Assistance: 3: Mod Lexicographer: Both upper extremities Wheelchair Parts Management: Needs assistance Distance: 40'  Trunk/Postural Assessment  Cervical Assessment Cervical Assessment: Within Functional Limits Thoracic Assessment Thoracic Assessment: Within Functional Limits Lumbar Assessment Lumbar Assessment: Within Functional Limits Postural Control Postural Control: Deficits on evaluation Righting Reactions: impaired  Balance Balance Balance Assessed: Yes Dynamic Standing Balance Dynamic Standing - Balance Support: Bilateral upper extremity supported Dynamic Standing - Level of Assistance: 4: Min assist Dynamic Standing - Balance Activities: Lateral lean/weight shifting;Reaching for objects Extremity Assessment      RLE Assessment RLE Assessment: Exceptions to [facility] (generalized deconditioning grossly  >/=3+/5) LLE Assessment LLE Assessment: Exceptions to WFL LLE AROM (degrees) LLE Overall AROM Comments: KI on at all times, some tightness of heel cord noted LLE Strength LLE Overall Strength Comments: unable to MMT due to retrictions however able to maintain body weight well   during ambulation and steps  FIM:  FIM - Locomotion: Wheelchair Distance: 40' FIM - Locomotion: Ambulation Ambulation/Gait Assistance: 4: Min assist   Refer to Care Plan for Long Term Goals  Recommendations for other services: None  Discharge Criteria: Patient will be discharged from PT if patient refuses treatment 3 consecutive times without medical reason, if treatment goals not met, if there is a change in medical status, if patient makes no progress towards goals or if patient is discharged from hospital.  The above assessment, treatment plan, treatment alternatives and goals were discussed and mutually agreed upon: by patient  Lahoma Rocker 04/27/2013, 9:45 AM

## 2013-04-27 NOTE — Patient Care Conference (Signed)
Inpatient RehabilitationTeam Conference and Plan of Care Update Date: 04/27/2013   Time: 10:05 AM    Patient Name: [name]      Medical Record Number: [id number]  Date of Birth: 23-Jul-1950 Sex: Female         Room/Bed: 4M05C/4M05C-01 Payor Info: Payor: MEDICARE / Plan: MEDICARE PART A AND B / Product Type: *No Product type* /    Admitting Diagnosis: PATELLAR FX  Admit Date/Time:  04/26/2013  6:33 PM Admission Comments: No comment available   Primary Diagnosis:  <principal problem not specified> Principal Problem: <principal problem not specified>  Patient Active Problem List   Diagnosis Date Noted  . Patellar fracture 04/21/2013  . Left patella fracture 04/21/2013  . Hypercalcemia 03/30/2013  . Pneumonia, organism unspecified 03/01/2013  . Splenomegaly 02/24/2013  . HHNC (hyperglycemic hyperosmolar nonketotic coma) 11/14/2012  . AKI (acute kidney injury) 11/14/2012  . Unspecified deficiency anemia 03/23/2012  . Acute GI bleeding 03/17/2011  . Chronic respiratory failure 03/17/2011  . Cirrhosis of liver not due to alcohol 03/17/2011  . Thrombocytopenia 03/17/2011  . Anemia 03/17/2011  . Irritable bowel syndrome 03/17/2011  . Hx of cervical cancer 03/17/2011  . CARCINOMA, LUNG, SQUAMOUS CELL 02/26/2007  . DEPRESSION 02/26/2007  . HYPERTENSION 02/26/2007  . COPD 02/26/2007  . Type II or unspecified type diabetes mellitus with neurological manifestations, uncontrolled 09/15/2006  . GERD 09/15/2006  . HEADACHE 09/15/2006  . COLONIC POLYPS, HX OF 09/15/2006    Expected Discharge Date: Expected Discharge Date: 05/05/13  Team Members Present: Physician leading conference: Dr. Alger Simons Social Worker Present: Lennart Pall, LCSW Nurse Present: Rozetta Nunnery, RN PT Present: Ladona Horns, PT OT Present: Salome Spotted, OT PPS Coordinator present : Ileana Ladd, PT     Current Status/Progress Goal Weekly Team Focus  Medical   left patella fx, mild copd, osteomy   increase safety, pain mgt, wound caere  anxiety control, activity mgt   Bowel/Bladder   Pt continent of bowel and bladder  Pt will remain continent of bowel and bladder      Swallow/Nutrition/ Hydration             ADL's   Mod A for ADL and transfers, decreased functional mobiltiy, decreased endurance  Supervision - Min A for ADL and transfers  Transfers, adapted bathing/dressing skills, AE/DME use training, dynamic standing balance   Mobility   mod assist  supervision  endurance, transfers, ambulation, standing balance, w/c mobility and parts management, safety in the home.    Communication             Safety/Cognition/ Behavioral Observations            Pain   Pt complains of Left knee pain 10/10. Pt On Oxy IR 20mg  q3  Pt will mainitian pain level <= 3/10      Skin   No current skin breakdown. Pt has old colostomy on LUQ  Pt will remain free of skin breakdown       Rehab Goals Patient on target to meet rehab goals: Yes *See Care Plan and progress notes for long and short-term goals.  Barriers to Discharge: brace/ortho precautions    Possible Resolutions to Barriers:  adaptive techniques and equipment, daughter can help at home as well    Discharge Planning/Teaching Needs:  home with family who can provide 24/7 supervision/ assist      Team Discussion:  New eval.  Doing well overall and anticipate supervision to mod i goals.  Already doing stairs!  No concerns.   Revisions to Treatment Plan:  None   Continued Need for Acute Rehabilitation Level of Care: The patient requires daily medical management by a physician with specialized training in physical medicine and rehabilitation for the following conditions: Daily direction of a multidisciplinary physical rehabilitation program to ensure safe treatment while eliciting the highest outcome that is of practical value to the patient.: Yes Daily medical management of patient stability for increased activity during  participation in an intensive rehabilitation regime.: Yes Daily analysis of laboratory values and/or radiology reports with any subsequent need for medication adjustment of medical intervention for : Post surgical problems;Pulmonary problems  Shreshta Medley 04/28/2013, 12:46 PM

## 2013-04-28 ENCOUNTER — Inpatient Hospital Stay (HOSPITAL_COMMUNITY): Payer: Medicare Other | Admitting: Physical Therapy

## 2013-04-28 ENCOUNTER — Inpatient Hospital Stay (HOSPITAL_COMMUNITY): Payer: Medicare Other

## 2013-04-28 ENCOUNTER — Inpatient Hospital Stay (HOSPITAL_COMMUNITY): Payer: Medicare Other | Admitting: *Deleted

## 2013-04-28 DIAGNOSIS — S82009A Unspecified fracture of unspecified patella, initial encounter for closed fracture: Secondary | ICD-10-CM | POA: Diagnosis not present

## 2013-04-28 DIAGNOSIS — D6959 Other secondary thrombocytopenia: Secondary | ICD-10-CM | POA: Diagnosis not present

## 2013-04-28 DIAGNOSIS — J9610 Chronic respiratory failure, unspecified whether with hypoxia or hypercapnia: Secondary | ICD-10-CM | POA: Diagnosis not present

## 2013-04-28 DIAGNOSIS — Z5189 Encounter for other specified aftercare: Secondary | ICD-10-CM | POA: Diagnosis not present

## 2013-04-28 DIAGNOSIS — W19XXXA Unspecified fall, initial encounter: Secondary | ICD-10-CM

## 2013-04-28 LAB — CBC
HEMATOCRIT: 34.1 % — AB (ref 36.0–46.0)
HEMOGLOBIN: 11 g/dL — AB (ref 12.0–15.0)
MCH: 27.3 pg (ref 26.0–34.0)
MCHC: 32.3 g/dL (ref 30.0–36.0)
MCV: 84.6 fL (ref 78.0–100.0)
Platelets: 463 10*3/uL — ABNORMAL HIGH (ref 150–400)
RBC: 4.03 MIL/uL (ref 3.87–5.11)
RDW: 15.5 % (ref 11.5–15.5)
WBC: 16.6 10*3/uL — AB (ref 4.0–10.5)

## 2013-04-28 LAB — GLUCOSE, CAPILLARY
GLUCOSE-CAPILLARY: 135 mg/dL — AB (ref 70–99)
GLUCOSE-CAPILLARY: 145 mg/dL — AB (ref 70–99)
Glucose-Capillary: 144 mg/dL — ABNORMAL HIGH (ref 70–99)
Glucose-Capillary: 151 mg/dL — ABNORMAL HIGH (ref 70–99)

## 2013-04-28 LAB — URINE CULTURE: Colony Count: 50000

## 2013-04-28 MED ORDER — FENTANYL 12 MCG/HR TD PT72
12.5000 ug | MEDICATED_PATCH | TRANSDERMAL | Status: DC
  Administered 2013-04-28 – 2013-05-01 (×2): 12.5 ug via TRANSDERMAL
  Filled 2013-04-28 (×2): qty 1

## 2013-04-28 NOTE — Plan of Care (Signed)
Problem: RH SAFETY Goal: RH STG ADHERE TO SAFETY PRECAUTIONS W/ASSISTANCE/DEVICE STG Adhere to Safety Precautions With Supervision Assistance/Device.  Outcome: Not Progressing Got up without assistance today  Problem: RH PAIN MANAGEMENT Goal: RH STG PAIN MANAGED AT OR BELOW PT'S PAIN GOAL 3 or less on scale of 1-10  Outcome: Not Progressing Consistently rates pain 7-9/10 in left knee

## 2013-04-28 NOTE — Progress Notes (Signed)
Physical Therapy Session Note  Patient Details  Name: [name] MRN: [id number] Date of Birth: [birth year]  Today's Date: 04/28/2013 Time: 2800-3491 Time Calculation (min): 20 min  Skilled Therapeutic Interventions/Progress Updates:    Patient received sitting in recliner, eating lunch. Patient reports she just got her lunch and would like a little extra time to eat. Returned 10 min later and patient ready for therapy, however missed first 10 minutes of session. No c/o pain. Session focused on functional transfers, gait training, and home/kitchen management and mobility in ADL apartment. Patient performed gait training x80' with RW and supervision. In ADL apartment, discussion/education about kitchen management, meal prep, reaching for objects high and low, and safety with use of RW while managing items. Patient requires min cues for sequencing to increase safety when reaching for objects and transporting to different area in kitchen. Discussion about having son or daughter move all necessary items to reachable heights so as to maintain knee precautions. Patient handed off to next therapist while standing in ADL kitchen.  Therapy Documentation Precautions:  Precautions Precautions: Fall Required Braces or Orthoses: Knee Immobilizer - Left Knee Immobilizer - Left: On at all times (may undo during ADL) Restrictions Weight Bearing Restrictions: Yes LLE Weight Bearing: Weight bearing as tolerated Other Position/Activity Restrictions: NO ROM LEFT KNEE; quad sets and ankle pumps OK General: Amount of Missed PT Time (min): 10 Minutes Missed Time Reason: Other (comment) (late arrival of lunch, patient eating) Locomotion : Ambulation Ambulation/Gait Assistance: 5: Supervision   See FIM for current functional status  Therapy/Group: Individual Therapy  Lillia Abed. Youssouf Shipley, PT, DPT 04/28/2013, 3:39 PM

## 2013-04-28 NOTE — Progress Notes (Signed)
Occupational Therapy Session Note  Patient Details  Name: [name] MRN: [id number] Date of Birth: 06-13-1950  Today's Date: 04/28/2013 Time: 0930-1030 Time Calculation (min): 60 min  Short Term Goals: Week 1:  OT Short Term Goal 1 (Week 1): STG=LTG due to anticipated short length of stay  Skilled Therapeutic Interventions/Progress Updates: ADL-retraining with focus on tub bench transfer, use of AE (leg lifter), endurance, and functional mobility.   Patient completed tub bench transfer using RW and leg lifter as instructed this session, with min assist to reposition left LE d/t pain.   Patient completed bathing, sitting and standing, using grab bars with verbal cues and standby assist for safety.   Patient required max assist for lower body dressing while seated in w/c, standing for assist with pull up pants and underwear.   No endurance deficits limiting performance this session; patient was limited by pain and need for setup assist to cover knee immobilizer for bathing.     Therapy Documentation Precautions:  Precautions Precautions: Fall Required Braces or Orthoses: Knee Immobilizer - Left Knee Immobilizer - Left: On at all times (may undo during ADL) Restrictions Weight Bearing Restrictions: Yes LLE Weight Bearing: Weight bearing as tolerated Other Position/Activity Restrictions: NO ROM LEFT KNEE; quad sets and ankle pumps OK  Vital Signs: Oxygen Therapy SpO2: 97 % O2 Device: Nasal cannula O2 Flow Rate (L/min): 2 L/min  Pain: Pain Assessment Pain Assessment: 0-10 Pain Score: 7  Pain Type: Acute pain Pain Location: Knee Pain Orientation: Left Pain Descriptors / Indicators: Throbbing;Sharp Pain Onset: On-going Patients Stated Pain Goal: 3 Pain Intervention(s): Medication (See eMAR) Multiple Pain Sites: No  ADL: ADL ADL Comments: see FIM  See FIM for current functional status  Therapy/Group: Individual Therapy  [name] 04/28/2013, 11:53 AM

## 2013-04-28 NOTE — Progress Notes (Addendum)
Physical Therapy Session Note  Patient Details  Name: JOE TANNEY MRN: [id number] Date of Birth: 28-Apr-1950  Today's Date: 04/28/2013 Time: 1100-1200 Time Calculation (min): 60 min  Skilled Therapeutic Interventions/Progress Updates:    Pain 9/10 Lt knee cap, premedicated before treatment. Pt happy to have had a shower- w/c propulsion around room looking for comb supervision level.  -Ambulation 2 x 100' with RW and min assist - cues for Lt quad set during Lt stance. Slow but maintains good balance.   -Stairs x 2 backwards with RW +2 for safety but min/mod assist overall, PT providing demonstration and sequencing cues.  -Stand pivot transfer training w/c <> mat; sit<>supine with min-guard assist. Supine Lt LE there-ex: quad sets (+manual facilitation); ankle pumps (+passive overstretch into dorsiflexion).   Second Session Time:  5537-4827 Time Calculation (min): 45 min Skilled Therapeutic Interventions/Progress Updates:  Pain: 8/10 Lt knee cap; repositioning and pain modulation techniques - Home environment ambulation over tiled and carpeted surfaces forwards/backstepping/sidestepping with RW and min-guard assist cues for sequencing and keeping both hands on RW while ambulating.  - Sit <> stands from low chair without arm rests simulating kitchen chair - difficult for patient (heavy mod assist) discussed other options including use of w/c in home. ADL bed management with leg lifter - supervision.  - Ambulation x 80' with RW min-guard assist controlled environment.  - Obstacle course: stepping over obstacles, weaving around obstacles, and ambulation over compliant surface with RW and min-guard assist min cues for safe management of RW and sequencing.    Therapy Documentation Precautions:  Precautions Precautions: Fall Required Braces or Orthoses: Knee Immobilizer - Left Knee Immobilizer - Left: On at all times (may undo during ADL) Restrictions Weight Bearing Restrictions: Yes LLE  Weight Bearing: Weight bearing as tolerated Other Position/Activity Restrictions: NO ROM LEFT KNEE; quad sets and ankle pumps OK  See FIM for current functional status  Therapy/Group: Individual Therapy both sessions  Lahoma Rocker 04/28/2013, 12:12 PM

## 2013-04-28 NOTE — Progress Notes (Signed)
[place] PHYSICAL MEDICINE & REHABILITATION     PROGRESS NOTE    Subjective/Complaints: Had a difficult night with increased pain in general. No breathing issues, no cough.  A 12 point review of systems has been performed and if not noted above is otherwise negative.   Objective: Vital Signs: Blood pressure 118/68, pulse 53, temperature 98.5 F (36.9 C), temperature source Oral, resp. rate 17, SpO2 96.00%. No results found.  Recent Labs  04/27/13 0640 04/28/13 0226  WBC 17.2* 16.6*  HGB 10.9* 11.0*  HCT 33.8* 34.1*  PLT 447* 463*    Recent Labs  04/27/13 0640  NA 133*  K 5.2  CL 99  GLUCOSE 147*  BUN 22  CREATININE 1.06  CALCIUM 8.8   CBG (last 3)   Recent Labs  04/27/13 1651 04/27/13 2022 04/28/13 0807  GLUCAP 171* 173* 135*    Wt Readings from Last 3 Encounters:  04/22/13 81.9 kg (180 lb 8.9 oz)  04/22/13 81.9 kg (180 lb 8.9 oz)  04/13/13 81.965 kg (180 lb 11.2 oz)    Physical Exam:  Constitutional: She is oriented to person, place, and time.  HENT: dentition poor, oral mucosa pink and moist  Head: Normocephalic.  Eyes: EOM are normal.  Neck: Normal range of motion. Neck supple. No thyromegaly present.  Cardiovascular: ?bigeminy rhythm Respiratory: Effort normal and breath sounds normal. No respiratory distress. No wheezes GI: Soft. Bowel sounds are normal. She exhibits no distension.  Colostomy tube in place, no leaks. Stool with expected form Neurological: She is alert and oriented to person, place, and time.   motor strength 5/5 in bilateral deltoid, bicep, tricep, grip,  4/5 in the right hip flexor, ksssssssnot tested secondary to pain left ankle dorsiflexor plantar flexor 3 minus secondary to pain  Knee immobilizer fitting appropriately Extremity no evidence of calf edema on the right side  Skin: Left Knee--incision clean and dry with staples in place. Incision with central erythema with central bruise. Hypersensitive to touch. Minimal  edema.  Psych: a little anxious, pleasant   Assessment/Plan: 1. Functional deficits secondary to left patellar fx which require 3+ hours per day of interdisciplinary therapy in a comprehensive inpatient rehab setting. Physiatrist is providing close team supervision and 24 hour management of active medical problems listed below. Physiatrist and rehab team continue to assess barriers to discharge/monitor patient progress toward functional and medical goals. FIM: FIM - Bathing Bathing Steps Patient Completed: Chest;Right Arm;Left Arm;Abdomen;Front perineal area Bathing: 3: Mod-Patient completes 5-7 46f 10 parts or 50-74%  FIM - Upper Body Dressing/Undressing Upper body dressing/undressing steps patient completed: Thread/unthread right bra strap;Thread/unthread left bra strap;Thread/unthread right sleeve of pullover shirt/dresss;Thread/unthread left sleeve of pullover shirt/dress;Put head through opening of pull over shirt/dress;Pull shirt over trunk Upper body dressing/undressing: 4: Min-Patient completed 75 plus % of tasks FIM - Lower Body Dressing/Undressing Lower body dressing/undressing: 1: Total-Patient completed less than 25% of tasks  FIM - Toileting Toileting steps completed by patient: Adjust clothing prior to toileting;Performs perineal hygiene;Adjust clothing after toileting Toileting: 5: Supervision: Safety issues/verbal cues  FIM - Air cabin crew Transfers Assistive Devices: Insurance account manager Transfers: 4-To toilet/BSC: Min A (steadying Pt. > 75%);5-From toilet/BSC: Supervision (verbal cues/safety issues)  FIM - Engineer, site Assistive Devices: Arm rests;Walker Bed/Chair Transfer: 4: Sit > Supine: Min A (steadying pt. > 75%/lift 1 leg);3: Bed > Chair or W/C: Mod A (lift or lower assist);4: Chair or W/C > Bed: Min A (steadying Pt. > 75%)  FIM - Locomotion: Wheelchair  Distance: 45 Locomotion: Wheelchair: 1: Travels less than 50 ft with minimal  assistance (Pt.>75%) FIM - Locomotion: Ambulation Locomotion: Ambulation Assistive Devices: Web designer Ambulation/Gait Assistance: 4: Min assist Locomotion: Ambulation: 1: Travels less than 50 ft with minimal assistance (Pt.>75%)  Comprehension Comprehension Mode: Auditory Comprehension: 7-Follows complex conversation/direction: With no assist  Expression Expression Mode: Verbal Expression: 6-Expresses complex ideas: With extra time/assistive device  Social Interaction Social Interaction: 7-Interacts appropriately with others - No medications needed.  Problem Solving Problem Solving: 5-Solves basic 90% of the time/requires cueing < 10% of the time  Memory Memory: 7-Complete Independence: No helper  Medical Problem List and Plan:  Displaced Left patellar fracture secondary to fall 04/21/2013  1. DVT Prophylaxis/Anticoagulation: Pharmaceutical: Lovenox . Check dopplers 2. Chronic abdominal pain/Pain Management: Continue oxycodone and Bentlyl for chronic pain. Added neurontin for dysesthesias left knee.   -add low dose fentanyl patch to help with persistent left knee pain (on narcs PTA for abd pain) 3. Anxiety disorder/ Mood: Provide ego support for high levels of anxiety. This will hopefully improve with time, ego support and ongoing therapy. LCSW to follow for evaluation and support.  4. Neuropsych: This patient is capable of making decisions on her own behalf.  5. NSCL cancer/ COPD: Oxygen with activity and at nights. Continue Dulera. Exam stable today--some anxiety 6. DM type 2: Will monitor with bid checks. Continue lantus 35 units bid with SSI for elevated BS.   -fair control at present 7. 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 GI symptoms stable but with poor intake. Will add supplements with meals.  8. Chronic Leucocytosis: Trend 18.2--->17.5-->19.3 and back to 17.2 today---continue to follow clinically 9. Anemia of chronic disease: stable. hgb 10.9 10. Wound  care: pt proficient in ostomy care- no problems or leaks now -staples in left knee- clean and intact. Change daily or as needed 11. ?bigeminy---NSR on last EKG, recheck ekg today  -pt denies SOB, palpitations, etc  LOS (Days) 2 A FACE TO FACE EVALUATION WAS PERFORMED  Abcde Oneil T 04/28/2013 8:11 AM

## 2013-04-29 ENCOUNTER — Inpatient Hospital Stay (HOSPITAL_COMMUNITY): Payer: Medicare Other

## 2013-04-29 ENCOUNTER — Inpatient Hospital Stay (HOSPITAL_COMMUNITY): Payer: Medicare Other | Admitting: *Deleted

## 2013-04-29 DIAGNOSIS — W19XXXA Unspecified fall, initial encounter: Secondary | ICD-10-CM

## 2013-04-29 DIAGNOSIS — S82009A Unspecified fracture of unspecified patella, initial encounter for closed fracture: Secondary | ICD-10-CM

## 2013-04-29 LAB — GLUCOSE, CAPILLARY
GLUCOSE-CAPILLARY: 123 mg/dL — AB (ref 70–99)
Glucose-Capillary: 86 mg/dL (ref 70–99)
Glucose-Capillary: 221 mg/dL — ABNORMAL HIGH (ref 70–99)
Glucose-Capillary: 161 mg/dL — ABNORMAL HIGH (ref 70–99)

## 2013-04-29 NOTE — Progress Notes (Addendum)
[place] PHYSICAL MEDICINE & REHABILITATION     PROGRESS NOTE    Subjective/Complaints: Had a difficult night with increased pain in general. No breathing issues, no cough.  A 12 point review of systems has been performed and if not noted above is otherwise negative.   Objective: Vital Signs: Blood pressure 126/55, pulse 71, temperature 98.9 F (37.2 C), temperature source Axillary, resp. rate 18, weight 86.2 kg (190 lb 0.6 oz), SpO2 96.00%. No results found.  Recent Labs  04/27/13 0640 04/28/13 0226  WBC 17.2* 16.6*  HGB 10.9* 11.0*  HCT 33.8* 34.1*  PLT 447* 463*    Recent Labs  04/27/13 0640  NA 133*  K 5.2  CL 99  GLUCOSE 147*  BUN 22  CREATININE 1.06  CALCIUM 8.8   CBG (last 3)   Recent Labs  04/28/13 1155 04/28/13 1647 04/28/13 2039  GLUCAP 144* 151* 145*    Wt Readings from Last 3 Encounters:  04/28/13 86.2 kg (190 lb 0.6 oz)  04/22/13 81.9 kg (180 lb 8.9 oz)  04/22/13 81.9 kg (180 lb 8.9 oz)    Physical Exam:  Constitutional: She is oriented to person, place, and time.  HENT: dentition poor, oral mucosa pink and moist  Head: Normocephalic.  Eyes: EOM are normal.  Neck: Normal range of motion. Neck supple. No thyromegaly present.  Cardiovascular: ?bigeminy rhythm Respiratory: Effort normal and breath sounds normal. No respiratory distress. No wheezes GI: Soft. Bowel sounds are normal. She exhibits no distension.  Colostomy tube in place, no leaks. Stool with expected form Neurological: She is alert and oriented to person, place, and time.   motor strength 5/5 in bilateral deltoid, bicep, tricep, grip,  4/5 in the right hip flexor, ksssssssnot tested secondary to pain left ankle dorsiflexor plantar flexor 3 minus secondary to pain  Knee immobilizer fitting appropriately Extremity no evidence of calf edema on the right side  Skin: Left Knee--incision clean and dry with staples in place. Incision with central erythema with central bruise.  Hypersensitive to touch. Minimal edema.  Psych: a little anxious, pleasant   Assessment/Plan: 1. Functional deficits secondary to left patellar fx which require 3+ hours per day of interdisciplinary therapy in a comprehensive inpatient rehab setting. Physiatrist is providing close team supervision and 24 hour management of active medical problems listed below. Physiatrist and rehab team continue to assess barriers to discharge/monitor patient progress toward functional and medical goals. FIM: FIM - Bathing Bathing Steps Patient Completed: Chest;Right Arm;Left Arm;Abdomen;Front perineal area Bathing: 3: Mod-Patient completes 5-7 10f 10 parts or 50-74%  FIM - Upper Body Dressing/Undressing Upper body dressing/undressing steps patient completed: Thread/unthread right bra strap;Thread/unthread left bra strap;Thread/unthread right sleeve of pullover shirt/dresss;Thread/unthread left sleeve of pullover shirt/dress;Put head through opening of pull over shirt/dress;Pull shirt over trunk Upper body dressing/undressing: 4: Min-Patient completed 75 plus % of tasks FIM - Lower Body Dressing/Undressing Lower body dressing/undressing: 1: Total-Patient completed less than 25% of tasks  FIM - Toileting Toileting steps completed by patient: Adjust clothing prior to toileting;Performs perineal hygiene;Adjust clothing after toileting Toileting Assistive Devices: Grab bar or rail for support Toileting: 5: Supervision: Safety issues/verbal cues  FIM - Radio producer Devices: Insurance account manager Transfers: 4-To toilet/BSC: Min A (steadying Pt. > 75%);5-From toilet/BSC: Supervision (verbal cues/safety issues)  FIM - Engineer, site Assistive Devices: Arm rests;Walker Bed/Chair Transfer: 5: Bed > Chair or W/C: Supervision (verbal cues/safety issues);5: Chair or W/C > Bed: Supervision (verbal cues/safety issues)  FIM - Locomotion:  Wheelchair Distance:  45 Locomotion: Wheelchair: 0: Activity did not occur FIM - Locomotion: Ambulation Locomotion: Ambulation Assistive Devices: Administrator Ambulation/Gait Assistance: 5: Supervision Locomotion: Ambulation: 2: Travels 50 - 149 ft with supervision/safety issues  Comprehension Comprehension Mode: Auditory Comprehension: 7-Follows complex conversation/direction: With no assist  Expression Expression Mode: Verbal Expression: 6-Expresses complex ideas: With extra time/assistive device  Social Interaction Social Interaction: 7-Interacts appropriately with others - No medications needed.  Problem Solving Problem Solving: 5-Solves basic 90% of the time/requires cueing < 10% of the time  Memory Memory: 7-Complete Independence: No helper  Medical Problem List and Plan:  Displaced Left patellar fracture secondary to fall 04/21/2013  1. DVT Prophylaxis/Anticoagulation: Pharmaceutical: Lovenox .  2. Chronic abdominal pain/Pain Management: Continue oxycodone and Bentlyl for chronic pain. Added neurontin for dysesthesias left knee.   - low dose fentanyl patch  helping with persistent left knee pain (on narcs PTA for abd pain) 3. Anxiety disorder/ Mood: Provide ego support for high levels of anxiety. This will hopefully improve with time, ego support and ongoing therapy. LCSW to follow for evaluation and support.  4. Neuropsych: This patient is capable of making decisions on her own behalf.  5. NSCL cancer/ COPD: Oxygen with activity and at nights. Continue Dulera. Exam stable today--some anxiety 6. DM type 2: Will monitor with bid checks. Continue lantus 35 units bid with SSI for elevated BS.   -fair control at present 7. 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 GI symptoms stable but with poor intake. Will add supplements with meals.  8. Chronic Leucocytosis: Trend 18.2--->17.5-->19.3 and back to 17.2 today---continue to follow clinically 9. Anemia of chronic disease: stable. hgb 10.9 10.  Wound care: pt proficient in ostomy care- no problems or leaks now -staples in left knee- clean and intact. Change daily or as needed 11. CP occ with deep breath, prob rib contusion--NSR on repeat EKG,  -pt denies SOB, palpitations, etc  LOS (Days) 3 A FACE TO FACE EVALUATION WAS PERFORMED  KIRSTEINS,ANDREW E 04/29/2013 6:58 AM

## 2013-04-29 NOTE — Progress Notes (Signed)
[facility] Individual Statement of Services  Patient Name:  [name]  Date:  04/29/2013  Welcome to the [facility].  Our goal is to provide you with an individualized program based on your diagnosis and situation, designed to meet your specific needs.  With this comprehensive rehabilitation program, you will be expected to participate in at least 3 hours of rehabilitation therapies Monday-Friday, with modified therapy programming on the weekends.  Your rehabilitation program will include the following services:  Physical Therapy (PT), Occupational Therapy (OT), 24 hour per day rehabilitation nursing, Therapeutic Recreaction (TR), Case Management (Social Worker), Rehabilitation Medicine, Nutrition Services and Pharmacy Services  Weekly team conferences will be held on Tuesdays to discuss your progress.  Your Social Worker will talk with you frequently to get your input and to update you on team discussions.  Team conferences with you and your family in attendance may also be held.  Expected length of stay: 5-7 days  Overall anticipated outcome: supervision to mod independent  Depending on your progress and recovery, your program may change. Your Social Worker will coordinate services and will keep you informed of any changes. Your Social Worker's name and contact numbers are listed  below.  The following services may also be recommended but are not provided by the [facility] will be made to provide these services after discharge if needed.  Arrangements include referral to agencies that provide these services.  Your insurance has been verified to be:  Medicare and Medicaid Your primary doctor is:  Dr. Alysia Penna  Pertinent information will be shared with your doctor and your insurance company.  Social  Worker:  [place], [place] or [phone]   Information discussed with and copy given to patient by: Lennart Pall, 04/29/2013, 8:55 AM

## 2013-04-29 NOTE — Progress Notes (Signed)
Social Work  Social Work Assessment and Plan  Patient Details  Name: [name] MRN: [id number] Date of Birth: 02/18/1950  Today's Date: 04/29/2013  Problem List:  Patient Active Problem List   Diagnosis Date Noted  . Patellar fracture 04/21/2013  . Left patella fracture 04/21/2013  . Hypercalcemia 03/30/2013  . Pneumonia, organism unspecified 03/01/2013  . Splenomegaly 02/24/2013  . HHNC (hyperglycemic hyperosmolar nonketotic coma) 11/14/2012  . AKI (acute kidney injury) 11/14/2012  . Unspecified deficiency anemia 03/23/2012  . Acute GI bleeding 03/17/2011  . Chronic respiratory failure 03/17/2011  . Cirrhosis of liver not due to alcohol 03/17/2011  . Thrombocytopenia 03/17/2011  . Anemia 03/17/2011  . Irritable bowel syndrome 03/17/2011  . Hx of cervical cancer 03/17/2011  . CARCINOMA, LUNG, SQUAMOUS CELL 02/26/2007  . DEPRESSION 02/26/2007  . HYPERTENSION 02/26/2007  . COPD 02/26/2007  . Type II or unspecified type diabetes mellitus with neurological manifestations, uncontrolled 09/15/2006  . GERD 09/15/2006  . HEADACHE 09/15/2006  . COLONIC POLYPS, HX OF 09/15/2006   Past Medical History:  Past Medical History  Diagnosis Date  . Cirrhosis   . GERD (gastroesophageal reflux disease)   . Cervical disc syndrome   . Lung cancer     squamous cell  . Chronic respiratory failure   . Diverticulitis of colon   . Perforation of colon   . Arthritis   . IBS (irritable bowel syndrome)   . Chronic lower GI bleeding   . Overactive bladder   . Cervical cancer   . Thrombocytopenia     sees Dr. Julien Nordmann   . Splenomegaly   . Anxiety   . Depression   . Hypertension   . Asthma   . Chronic headache disorder   . Blood transfusion without reported diagnosis   . Heart murmur   . Hyperlipidemia   . Lung cancer   . COPD (chronic obstructive pulmonary disease)     sees Dr. Gwenette Greet   . Anemia     sees Dr. Julien Nordmann, due to chronic disease and GI losses   . Diabetes mellitus      sees Dr. Cruzita Lederer   . Colostomy care   . Hypercalcemia    Past Surgical History:  Past Surgical History  Procedure Laterality Date  . Appendectomy    . Cholecystectomy    . Colostomy    . Pneumonectomy    . Cervix removed    . Bowel resection    . Esophagogastroduodenoscopy  03/19/2011    Procedure: ESOPHAGOGASTRODUODENOSCOPY (EGD);  Surgeon: [name], MD;  Location: [facility];  Service: Endoscopy;  Laterality: N/A;  . Colonoscopy  03/19/2011    Procedure: COLONOSCOPY;  Surgeon: [name], MD;  Location: [facility];  Service: Endoscopy;  Laterality: N/A;  . Givens capsule study  03/20/2011    Procedure: GIVENS CAPSULE STUDY;  Surgeon: [name], MD;  Location: [facility];  Service: Endoscopy;  Laterality: N/A;  . Small bowel obstruction repair  March 2012  . Umbilical hernia repair  March 2012  . Splenectomy, total N/A 02/24/2013    Procedure: SPLENECTOMY;  Surgeon: Harl Bowie, MD;  Location: [facility];  Service: General;  Laterality: N/A;  . Orif patella fracture Left 04/21/2013    DR [name]  . Orif patella Left 04/21/2013    Procedure: OPEN REDUCTION INTERNAL (ORIF) FIXATION PATELLA;  Surgeon: Augustin Schooling, MD;  Location: [facility];  Service: Orthopedics;  Laterality: Left;   Social History:  reports that she quit  smoking about 11 years ago. Her smoking use included Cigarettes. She has a 70 pack-year smoking history. She has never used smokeless tobacco. She reports that she does not drink alcohol or use illicit drugs.  Family / Support Systems Marital Status: Divorced How Long?: 2 yrs Patient Roles: Parent Children: son, [name] @ [phone] and daughter-in-law, [name] @ [phone] (pt lives with them and their 4 children);  daughter, [name] and son, [name] [facility]) and daughterVonzell Berry @ [phone] Anticipated Caregiver: [name] - daughter in law Ability/Limitations of Caregiver: Son works.  Dtr-in-law stays home  with the children and can provide supervision Caregiver Availability: 24/7 Family Dynamics: pt describes very good relationship with all children.  Has lived with son and daughter-in-law for approx 1 year and notes this is a very supportive arrangement.  Good relationship with daughter-in-law.  Social History Preferred language: English Religion: Christian Cultural Background: NA Education: HS Read: Yes Write: Yes Employment Status: Disabled Date Retired/Disabled/Unemployed: 2004 Freight forwarder Issues: none Guardian/Conservator: none - per MD, pt is capable of making decisions on her own behalf   Abuse/Neglect Physical Abuse: Denies Verbal Abuse: Denies Sexual Abuse: Denies Exploitation of patient/patient's resources: Denies Self-Neglect: Denies  Emotional Status Pt's affect, behavior adn adjustment status: pt very pleasant, talkative and motivated for therapies.  Pleased with progress so far.  Denies any emotional distress. No s/s of depression or anxiety.  Denies any concerns about assistance available at home.  Will monitor. Recent Psychosocial Issues: None Pyschiatric History: pt does not h/o some anxiety and depression - not formally receiving treatment Substance Abuse History: NA  Patient / Family Perceptions, Expectations & Goals Pt/Family understanding of illness & functional limitations: pt and family with good understanding of injury and precautions to be followed Premorbid pt/family roles/activities: pt was independent in the home using a rolling walker and occasionally just "furnitiure walking" Anticipated changes in roles/activities/participation: little change anticipated as family already providing 24/7 supervision and goals set for mod i / super Pt/family expectations/goals: "I just want to be able to get around the house ok"  [employer]: None Premorbid Home Care/DME Agencies: Other (Comment) ([facility]) Transportation available at discharge: yes  Discharge Planning Living Arrangements: Children Support Systems: Children Type of Residence: Private residence Insurance Resources: Medicare;Medicaid (specify county) Sports coach) Financial Resources: Aeronautical engineer Screen Referred: No Living Expenses: Lives with family Money Management: Family Does the patient have any problems obtaining your medications?: No Home Management: pt/ family Patient/Family Preliminary Plans: pt to return home with son and dtr-in-law Social Work Anticipated Follow Up Needs: HH/OP Expected length of stay: 7-10 days  Clinical Impression Very pleasant, motivated woman here following fall and knee surgery.  Good family support and no concerns about care at home.  Anticipate short LOS.  Will follow for support and d/c planning needs.  Briggette Najarian 04/29/2013, 8:54 AM

## 2013-04-29 NOTE — Progress Notes (Signed)
Occupational Therapy Session Note  Patient Details  Name: [name] MRN: [id number] Date of Birth: 1950/08/17  Today's Date: 04/29/2013 Time: 1030-1130 Time Calculation (min): 60 min  Short Term Goals: Week 1:  OT Short Term Goal 1 (Week 1): STG=LTG due to anticipated short length of stay  Skilled Therapeutic Interventions/Progress Updates:  ADL-retraining with focus on safety awareness, dynamic standing balance, transfers and functional mobility using RW during pan bath at sink.   Patient received sitting in recliner receptive for continued treatment.  Patient rose from sitting to standing at RW unassisted but demo'd LOB 2X (posterior) while ambulating to bathroom.   Patient requested to toilet, requiring only standby assist, requesting door closed for privacy.   Patient was instructed to call therapist when finished for assist with transfer and mobility but was heard moving in bathroom and found standing at toilet cleaning out colostomy bag.   OT re-educated patient on fall risk, specifically relating to recent loss of balance during mobility.   Patient verbalized understanding.   Patient then completed bathing and dressing at sink, sitting and standing, with standby assist and no further episodes of LOB.     Therapy Documentation Precautions:  Precautions Precautions: Fall Required Braces or Orthoses: Knee Immobilizer - Left Knee Immobilizer - Left: On at all times (may undo during ADL) Restrictions Weight Bearing Restrictions: Yes LLE Weight Bearing: Weight bearing as tolerated Other Position/Activity Restrictions: NO ROM LEFT KNEE; quad sets and ankle pumps OK  Vital Signs: Oxygen Therapy O2 Device: None (Room air)  Pain: Pain Assessment Pain Score: 5   ADL: ADL ADL Comments: see FIM   See FIM for current functional status  Therapy/Group: Individual Therapy  Second session: Time: 5003-7048 Time Calculation (min):  45 min  Pain Assessment: 8/10, left  knee  Skilled Therapeutic Interventions: ADL-retraining with focus on acquisition of skills using AE for lower body dressing and orthotics (KI) managment.   Patient instructed on use of sock aid, reach, elastic shoe laces, and shoe horn.   Patient donned left and right sock using sock aid, right shoe using reacher, elastic laces and shoe horn but required min assist with left shoe due to [place].   Patient able to don left pant leg, stand and pull up pants at [facility] with standby assist for safety.   See FIM for current functional status  Therapy/Group: Individual Therapy  [place] 04/29/2013, 12:59 PM

## 2013-04-29 NOTE — Progress Notes (Signed)
Physical Therapy Session Note  Patient Details  Name: [name] MRN: [id number] Date of Birth: 09/11/1950  Today's Date: 04/29/2013 Time: 0803-0900 and 14:33-15:02 (27min)  Time Calculation (min): 57 min   Skilled Therapeutic Interventions/Progress Updates:  1:2  Tx focused on functional mobility, car transfers with running board step, gait with RW in controlled setting, and therex for bil LE strengthening. Pt up eatin gbreakfast - performed seated therex including bil ankle pumps, quad sets, and glute sets; passive heel cord stretch.   WC propulsion x120' with S cues  Simulated Lucianne Lei transfer with min A using RW to reverse step up into car, assist for RLE management  Gait [phone] and x25' with RW and close S, decreased speed, step-to pattern  Seated therex with 2# weight on R for LAQ and marching  Recliner<>WC with min A for UE support, stand-step. Pt given cues to quad set with mobility.  7/10 L knee cap pain; nursing aware and bringin nmeds end of tx.   2:2 Pt getting up independently from toilet before calling for help - reminded pt of safety.  Gait 2x150' with RW and close S with step-through pattern and decreased pace. Pt tends to lean to R for decreased L weight-bearing, but no LOB during turns.  Performed standing therex in // bars with bil UE support including heel raises, L hip ABD, L hip EXT 2x10 each. Pt also performed static unsupported standing x1min with 1 instance of needing to grab bars.  Pt needed seated rests between tasks.  Pt left up in recliner with all needs in reach.      Therapy Documentation Precautions:  Precautions Precautions: Fall Required Braces or Orthoses: Knee Immobilizer - Left Knee Immobilizer - Left: On at all times (may undo during ADL) Restrictions Weight Bearing Restrictions: Yes LLE Weight Bearing: Weight bearing as tolerated Other Position/Activity Restrictions: NO ROM LEFT KNEE; quad sets and ankle pumps OK Vital Signs: Therapy  Vitals Temp: 98.9 F (37.2 C) Temp src: Axillary Pulse Rate: 99 Resp: 18 BP: 126/55 mmHg Patient Position, if appropriate: Lying Oxygen Therapy SpO2: 91 % O2 Device: None (Room air) Pain:6/10 - nursing made aware    Locomotion : Ambulation Ambulation/Gait Assistance: 5: Supervision Wheelchair Mobility Distance: 120   See FIM for current functional status  Therapy/Group: Individual Therapy Kennieth Rad, PT, DPT   04/29/2013, 9:25 AM

## 2013-04-30 ENCOUNTER — Inpatient Hospital Stay (HOSPITAL_COMMUNITY): Payer: Medicare Other | Admitting: Physical Therapy

## 2013-04-30 ENCOUNTER — Inpatient Hospital Stay (HOSPITAL_COMMUNITY): Payer: Medicare Other

## 2013-04-30 LAB — GLUCOSE, CAPILLARY
GLUCOSE-CAPILLARY: 122 mg/dL — AB (ref 70–99)
Glucose-Capillary: 128 mg/dL — ABNORMAL HIGH (ref 70–99)
Glucose-Capillary: 132 mg/dL — ABNORMAL HIGH (ref 70–99)
Glucose-Capillary: 149 mg/dL — ABNORMAL HIGH (ref 70–99)

## 2013-04-30 MED ORDER — TRAMADOL HCL 50 MG PO TABS
50.0000 mg | ORAL_TABLET | Freq: Two times a day (BID) | ORAL | Status: DC | PRN
Start: 2013-04-30 — End: 2013-05-04
  Administered 2013-04-30: 50 mg via ORAL
  Filled 2013-04-30: qty 1

## 2013-04-30 MED ORDER — TRAMADOL HCL 50 MG PO TABS: 50.0000 mg | ORAL_TABLET | Freq: Four times a day (QID) | ORAL | Status: DC | PRN

## 2013-04-30 NOTE — Progress Notes (Signed)
Occupational Therapy Session Note  Patient Details  Name: [name] MRN: [id number] Date of Birth: [birth year]  Today's Date: 04/30/2013 Time: 1100-1200 Time Calculation (min): 60 min  Short Term Goals: Week 1:  OT Short Term Goal 1 (Week 1): STG=LTG due to anticipated short length of stay  Skilled Therapeutic Interventions/Progress Updates: ADL-retraining with focus on dynamic standing balance, functional mobility during ADL, safety awareness, and orthotics management.  Patient able to complete toileting, bathing and dressing sitting and standing using RW skillfully with min verbal cues for safety and overall only supervision assist.   Patient requires setup assist to manage LLE knee immobilizer but is able to monitor position and comfort of KI independently.  Patient's safety awareness improved during this session as evidenced by alerting OT when she was off the toilet and what she planned to do before initiating an action.   Patient affirms need for AE to improve independence with lower body dressing but she reports lack of funds and/or access to her credit card.   Patient left in her recliner, legs elevated with call light and phone within reach.      Therapy Documentation Precautions:  Precautions Precautions: Fall Required Braces or Orthoses: Knee Immobilizer - Left Knee Immobilizer - Left: On at all times (may undo during ADL) Restrictions Weight Bearing Restrictions: Yes LLE Weight Bearing: Weight bearing as tolerated Other Position/Activity Restrictions: NO ROM LEFT KNEE; quad sets and ankle pumps OK  Vital Signs: Oxygen Therapy SpO2: 95 % O2 Device: None (Room air)  Pain: Pain Assessment Pain Assessment: 0-10 Pain Score: 8  Pain Type: Acute pain Pain Location: Knee Pain Orientation: Left Pain Descriptors / Indicators: Sharp Pain Frequency: Constant Pain Onset: On-going Patients Stated Pain Goal: 3 Pain Intervention(s): Medication (See eMAR)  ADL: ADL ADL  Comments: see FIM   See FIM for current functional status  Therapy/Group: Individual Therapy  Second session: Time: 0300-9233 Time Calculation (min):  45 min  Pain Assessment: 9/10, right knee  Skilled Therapeutic Interventions: Therapeutic activities with focus on sit><stand, dynamic standing balance, and endurance.   Patient completed 1 game (10 frames) of Wii bowling with initial instruction and supervision while standing at [place] during game play.   Patient completed sit><stand 14 times during this session with no evidence of LOB however reporting exacerbation of knee pain.   RN notified; pain meds administered.   Patient ambulated from RN station to her room (approx 20') using RW with supervision.  Patient left in room, sitting in recliner with call light and phone within reach, legs elevated.  See FIM for current functional status  Therapy/Group: Individual Therapy  [place] 04/30/2013, 12:32 PM

## 2013-04-30 NOTE — Progress Notes (Signed)
Physical Therapy Session Note  Patient Details  Name: [name] MRN: [id number] Date of Birth: [birth year]  Today's Date: 04/30/2013 Time: 0730-0830 Time Calculation (min): 60 min  Skilled Therapeutic Interventions/Progress Updates:   - Pt forgot forgot to ask about family about coming in for family ed - reinforced need for them to come in to practice complicated mobility issues such as car transfers and stairs. Pt verbalized understanding. Tried to contact daugther end of session - unable. SW made aware. - Car transfer - Pt's daughter has SUV/Van, no bucket seat available. Practiced in simulated car on unit (S) however pt would likely benefit from actual car transfer during family ed.  - Stairs- 2 sets of 4 consecutive steps backwards with RW and min assist for stabilization of RW. Cues for sequencing of RW and LEs.  - Discussed use of w/c in home for mobility if daughter busy with kids - also discussed avoiding reliance on AD and to ambulate as much as possible once HHPT clears for mod I ambulation.   Therapy Documentation Precautions:  Precautions Precautions: Fall Required Braces or Orthoses: Knee Immobilizer - Left Knee Immobilizer - Left: On at all times (may undo during ADL) Restrictions Weight Bearing Restrictions: Yes LLE Weight Bearing: Weight bearing as tolerated Other Position/Activity Restrictions: NO ROM LEFT KNEE; quad sets and ankle pumps OK Pain: Pain Assessment Pain Assessment: 0-10 Pain Score: 9  Pain Type: Acute pain Pain Location: Knee Pain Orientation: Left Pain Descriptors / Indicators: Sharp Pain Onset: On-going Patients Stated Pain Goal: 3 Pain Intervention(s): Repositioned  See FIM for current functional status  Therapy/Group: Individual Therapy  [place] Rocker 04/30/2013, 9:27 AM

## 2013-04-30 NOTE — Progress Notes (Signed)
[facility]     PROGRESS NOTE    Subjective/Complaints: Pain control adequate to allow therapy participation A 12 point review of systems has been performed and if not noted above is otherwise negative.   Objective: Vital Signs: Blood pressure 152/67, pulse 88, temperature 97.6 F (36.4 C), temperature source Axillary, resp. rate 20, weight 86.2 kg (190 lb 0.6 oz), SpO2 97.00%. No results found.  Recent Labs  04/28/13 0226  WBC 16.6*  HGB 11.0*  HCT 34.1*  PLT 463*   No results found for this basename: NA, K, CL, CO, GLUCOSE, BUN, CREATININE, CALCIUM,  in the last 72 hours CBG (last 3)   Recent Labs  04/29/13 1649 04/29/13 2042 04/30/13 0732  GLUCAP 221* 161* 128*    Wt Readings from Last 3 Encounters:  04/28/13 86.2 kg (190 lb 0.6 oz)  04/22/13 81.9 kg (180 lb 8.9 oz)  04/22/13 81.9 kg (180 lb 8.9 oz)    Physical Exam:  Constitutional: She is oriented to person, place, and time.  HENT: dentition poor, oral mucosa pink and moist  Head: Normocephalic.  Eyes: EOM are normal.  Neck: Normal range of motion. Neck supple. No thyromegaly present.  Cardiovascular: ?bigeminy rhythm Respiratory: Effort normal and breath sounds normal. No respiratory distress. No wheezes GI: Soft. Bowel sounds are normal. She exhibits no distension.  Colostomy tube in place, no leaks. Stool with expected form Neurological: She is alert and oriented to person, place, and time.   motor strength 5/5 in bilateral deltoid, bicep, tricep, grip,  4/5 in the right hip flexor, ksssssssnot tested secondary to pain left ankle dorsiflexor plantar flexor 3 minus secondary to pain  Knee immobilizer fitting appropriately Extremity no evidence of calf edema on the right side   Hypersensitive to touch. Minimal edema.  Psych: a little anxious, pleasant   Assessment/Plan: 1. Functional deficits secondary to left patellar fx which require 3+ hours per day of  interdisciplinary therapy in a comprehensive inpatient rehab setting. Physiatrist is providing close team supervision and 24 hour management of active medical problems listed below. Physiatrist and rehab team continue to assess barriers to discharge/monitor patient progress toward functional and medical goals. FIM: FIM - Bathing Bathing Steps Patient Completed: Chest;Right Arm;Left Arm;Abdomen;Front perineal area;Buttocks;Right upper leg;Right lower leg (including foot) Bathing: 4: Min-Patient completes 8-9 58f 10 parts or 75+ percent  FIM - Upper Body Dressing/Undressing Upper body dressing/undressing steps patient completed: Thread/unthread right bra strap;Thread/unthread left bra strap;Thread/unthread right sleeve of pullover shirt/dresss;Thread/unthread left sleeve of pullover shirt/dress;Put head through opening of pull over shirt/dress;Pull shirt over trunk Upper body dressing/undressing: 4: Min-Patient completed 75 plus % of tasks FIM - Lower Body Dressing/Undressing Lower body dressing/undressing steps patient completed: Thread/unthread right underwear leg;Thread/unthread left underwear leg;Pull underwear up/down;Thread/unthread right pants leg;Pull pants up/down Lower body dressing/undressing: 3: Mod-Patient completed 50-74% of tasks  FIM - Toileting Toileting steps completed by patient: Adjust clothing prior to toileting;Performs perineal hygiene;Adjust clothing after toileting Toileting Assistive Devices: Grab bar or rail for support Toileting: 5: Supervision: Safety issues/verbal cues  FIM - Radio producer Devices: Insurance account manager Transfers: 4-To toilet/BSC: Min A (steadying Pt. > 75%);5-From toilet/BSC: Supervision (verbal cues/safety issues)  FIM - Control and instrumentation engineer Devices: Walker;Arm rests Bed/Chair Transfer: 4: Bed > Chair or W/C: Min A (steadying Pt. > 75%);5: Chair or W/C > Bed: Supervision (verbal cues/safety  issues)  FIM - Locomotion: Wheelchair Distance: 120 Locomotion: Wheelchair: 0: Activity did not occur FIM -  Locomotion: Ambulation Locomotion: Ambulation Assistive Devices: Administrator Ambulation/Gait Assistance: 5: Supervision Locomotion: Ambulation: 5: Travels 150 ft or more with supervision/safety issues  Comprehension Comprehension Mode: Auditory Comprehension: 5-Understands complex 90% of the time/Cues < 10% of the time  Expression Expression Mode: Verbal Expression: 6-Expresses complex ideas: With extra time/assistive device  Social Interaction Social Interaction: 7-Interacts appropriately with others - No medications needed.  Problem Solving Problem Solving: 5-Solves complex 90% of the time/cues < 10% of the time  Memory Memory: 5-Recognizes or recalls 90% of the time/requires cueing < 10% of the time  Medical Problem List and Plan:  Displaced Left patellar fracture secondary to fall 04/21/2013  1. DVT Prophylaxis/Anticoagulation: Pharmaceutical: Lovenox .  2. Chronic abdominal pain/Pain Management: Continue oxycodone and Bentlyl for chronic pain. Added neurontin for dysesthesias left knee.   - low dose fentanyl patch  helping with persistent left knee pain (on narcs PTA for abd pain) 3. Anxiety disorder/ Mood: Provide ego support for high levels of anxiety. This will hopefully improve with time, ego support and ongoing therapy. LCSW to follow for evaluation and support.  4. Neuropsych: This patient is capable of making decisions on her own behalf.  5. NSCL cancer/ COPD: Oxygen with activity and at nights. Continue Dulera. Exam stable today--some anxiety 6. DM type 2: Will monitor with bid checks. Continue lantus 35 units bid with SSI for elevated BS.   -fair control at present 7. 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 GI symptoms stable but with poor intake. Will add supplements with meals.  8. Chronic Leucocytosis: Trend 18.2--->17.5-->19.3 and back to 17.2  today---continue to follow clinically 9. Anemia of chronic disease: stable. hgb 10.9 10. Wound care: pt proficient in ostomy care- no problems or leaks now -staples in left knee- clean and intact. Change daily or as needed 11. CP occ with deep breath, prob rib contusion--NSR on repeat EKG,  -pt denies SOB, palpitations, etc  LOS (Days) 4 A FACE TO FACE EVALUATION WAS PERFORMED  Clydell Alberts E 04/30/2013 7:48 AM

## 2013-04-30 NOTE — Progress Notes (Signed)
Physical Therapy Session Note  Patient Details  Name: [name] MRN: [id number] Date of Birth: 25-Mar-1950  Today's Date: 04/30/2013 Time: 1030-1100 Time Calculation (min): 30 min  Skilled Therapeutic Interventions/Progress Updates:    Session focused on w/c mobility in controlled (2 x 150') and home (60') environments managing tight spaces forwards/backwards and sharp turns. Educated on adjusting elevated leg rest to navigate such tight spaces. Practiced in ADL kitchen, recommended fam lower commonly used items from cabinets, move fridge items to front for better access for pt when using w/c (when daugther/son unavailable for supervision during ambulation). W/c parts management with supervision cues.   Therapy Documentation Precautions:  Precautions Precautions: Fall Required Braces or Orthoses: Knee Immobilizer - Left Knee Immobilizer - Left: On at all times (may undo during ADL) Restrictions Weight Bearing Restrictions: Yes LLE Weight Bearing: Weight bearing as tolerated Other Position/Activity Restrictions: NO ROM LEFT KNEE; quad sets and ankle pumps OK Pain: Pain Assessment Pain Assessment: 0-10 Pain Score: 8  Pain Type: Acute pain Pain Location: Knee Pain Orientation: Left Pain Descriptors / Indicators: Sharp Pain Frequency: Constant Pain Onset: On-going Patients Stated Pain Goal: 3 Pain Intervention(s): RN aware  See FIM for current functional status  Therapy/Group: Individual Therapy  [place] Rocker 04/30/2013, 12:29 PM

## 2013-05-01 ENCOUNTER — Encounter (HOSPITAL_COMMUNITY): Payer: Medicare Other

## 2013-05-01 ENCOUNTER — Inpatient Hospital Stay (HOSPITAL_COMMUNITY): Payer: Medicare Other | Admitting: *Deleted

## 2013-05-01 ENCOUNTER — Inpatient Hospital Stay (HOSPITAL_COMMUNITY): Payer: Medicare Other | Admitting: Physical Therapy

## 2013-05-01 ENCOUNTER — Inpatient Hospital Stay (HOSPITAL_COMMUNITY): Payer: Medicare Other

## 2013-05-01 LAB — GLUCOSE, CAPILLARY
GLUCOSE-CAPILLARY: 109 mg/dL — AB (ref 70–99)
GLUCOSE-CAPILLARY: 180 mg/dL — AB (ref 70–99)
Glucose-Capillary: 177 mg/dL — ABNORMAL HIGH (ref 70–99)
Glucose-Capillary: 166 mg/dL — ABNORMAL HIGH (ref 70–99)

## 2013-05-01 NOTE — Progress Notes (Signed)
Occupational Therapy Session Note  Patient Details  Name: [name] MRN: [id number] Date of Birth: 1950/06/14  Today's Date: 05/01/2013 Time: 1130-1200 Time Calculation (min): 30 min  Short Term Goals: Week 1:  OT Short Term Goal 1 (Week 1): STG=LTG due to anticipated short length of stay  Skilled Therapeutic Interventions/Progress Updates: ADL-retraining with focus on immediate recall, safety awareness, stress reduction techniques using positive "self-talk" d/t anxiety, and adapted dressing skills.   Patient received in her recliner wearing nightgown/pajamas.  Patient demonstrated increased emotional lability and confusion d/t what she interpreted as her poor performance during prior physical therapy session.   Patient was instructed on stress reduction practice (mindfulness and improved self-talk) and reported relief of symptoms of anxiety after review of intent and goal of sessions.   Patient then recovered her clothing and performed light bathing and dressing at sink, sitting and standing.  Patient required close supervision during mobility retrieving clothing with increased confusion as evidenced by attempt to use walker backwards when walking back to sink from [employer].  Patient required min assist to lace underwear and steadying assist with LOB 1 time while pulling up her pants at sink.   Patient returned to recliner with assist to elevate legs; call light placed within reach.  Therapy Documentation Precautions:  Precautions Precautions: Fall Required Braces or Orthoses: Knee Immobilizer - Left Knee Immobilizer - Left: On at all times (may undo during ADL) Restrictions Weight Bearing Restrictions: Yes LLE Weight Bearing: Weight bearing as tolerated Other Position/Activity Restrictions: NO ROM LEFT KNEE; quad sets and ankle pumps OK  Vital Signs: Oxygen Therapy SpO2: 94 % O2 Device: None (Room air)  Pain: Pain Assessment Pain Score: 6   ADL: ADL ADL Comments: see  FIM  See FIM for current functional status  Therapy/Group: Individual Therapy  Second session: Time: 1330-1400 Time Calculation (min):  30 min  Pain Assessment: 6/10, left knee  Skilled Therapeutic Interventions: ADL-retraining with focus on patient education with patient and her son, [name].  OT reviewed level of assistance currently required during ADL (supervision during transfers and min assist with lower body dressing, steadying assist during dynamic standing).   Son then reported concerns that "supervision" assistance is not feasible at home despite presence of spouse and children when he is away (truck driver), often extended days away from home.   Per son, wife attends to all 4 children at home and is also suffering medical issues rendering her incapable of "stopping everything" to provide supervision or even min assist, when needed.    OT reviewed tub transfer skills, with patient demonstrating proficiency with tub transfer from RW using leg lifter.   Patient demo'd LOB 1 time after exiting tub due to distraction of attending to conversation of OT and her son.   OT reiterated that patient's attention (intact alternating attention) is consistent and reliable if undistracted but she remains impaired when attempting to divide her attention.   Both patient and her son affirm that if patient is not "independent" at home, there remains a potential for fall and reinjury.   Patient and son left with physical therapist in ADL apartment at end of session.   See FIM for current functional status  Therapy/Group: Individual Therapy  [place] 05/01/2013, 12:54 PM

## 2013-05-01 NOTE — Progress Notes (Addendum)
[facility] PHYSICAL MEDICINE & REHABILITATION     PROGRESS NOTE    Subjective/Complaints: Pain control adequate to allow therapy participation On chronic narcotics at home A 12 point review of systems has been performed and if not noted above is otherwise negative.   Objective: Vital Signs: Blood pressure 142/59, pulse 78, temperature 98.2 F (36.8 C), temperature source Oral, resp. rate 18, weight 86.2 kg (190 lb 0.6 oz), SpO2 96.00%. No results found. No results found for this basename: WBC, HGB, HCT, PLT,  in the last 72 hours No results found for this basename: NA, K, CL, CO, GLUCOSE, BUN, CREATININE, CALCIUM,  in the last 72 hours CBG (last 3)   Recent Labs  04/30/13 1645 04/30/13 2056 05/01/13 0723  GLUCAP 122* 149* 109*    Wt Readings from Last 3 Encounters:  04/28/13 86.2 kg (190 lb 0.6 oz)  04/22/13 81.9 kg (180 lb 8.9 oz)  04/22/13 81.9 kg (180 lb 8.9 oz)    Physical Exam:  Constitutional: She is oriented to person, place, and time.  HENT: dentition poor, oral mucosa pink and moist  Head: Normocephalic.  Eyes: EOM are normal.  Neck: Normal range of motion. Neck supple. No thyromegaly present.  Cardiovascular: ?bigeminy rhythm Respiratory: Effort normal and breath sounds normal. No respiratory distress. No wheezes GI: Soft. Bowel sounds are normal. She exhibits no distension.  Colostomy tube in place, no leaks. Stool with expected form Neurological: She is alert and oriented to person, place, and time.   motor strength 5/5 in bilateral deltoid, bicep, tricep, grip,  4/5 in the right hip flexor, ksssssssnot tested secondary to pain left ankle dorsiflexor plantar flexor 3 minus secondary to pain  Knee immobilizer fitting appropriately Extremity no evidence of calf edema on the right side   Hypersensitive to touch. Minimal edema.  Psych: a little anxious, pleasant Skin:  Incision Left knee CDI, small ecchymosis mid patellar  Assessment/Plan: 1. Functional  deficits secondary to left patellar fx which require 3+ hours per day of interdisciplinary therapy in a comprehensive inpatient rehab setting. Physiatrist is providing close team supervision and 24 hour management of active medical problems listed below. Physiatrist and rehab team continue to assess barriers to discharge/monitor patient progress toward functional and medical goals. FIM: FIM - Bathing Bathing Steps Patient Completed: Chest;Right Arm;Left Arm;Abdomen;Front perineal area;Buttocks;Right upper leg;Right lower leg (including foot) Bathing: 4: Min-Patient completes 8-9 33f 10 parts or 75+ percent  FIM - Upper Body Dressing/Undressing Upper body dressing/undressing steps patient completed: Thread/unthread right bra strap;Thread/unthread left bra strap;Thread/unthread right sleeve of pullover shirt/dresss;Thread/unthread left sleeve of pullover shirt/dress;Put head through opening of pull over shirt/dress;Pull shirt over trunk Upper body dressing/undressing: 4: Min-Patient completed 75 plus % of tasks FIM - Lower Body Dressing/Undressing Lower body dressing/undressing steps patient completed: Thread/unthread right underwear leg;Thread/unthread left underwear leg;Pull underwear up/down;Thread/unthread right pants leg;Pull pants up/down Lower body dressing/undressing: 3: Mod-Patient completed 50-74% of tasks  FIM - Toileting Toileting steps completed by patient: Adjust clothing prior to toileting;Performs perineal hygiene;Adjust clothing after toileting Toileting Assistive Devices: Grab bar or rail for support Toileting: 6: Assistive device: No helper  FIM - Radio producer Devices: Insurance account manager Transfers: 4-To toilet/BSC: Min A (steadying Pt. > 75%);5-From toilet/BSC: Supervision (verbal cues/safety issues)  FIM - Control and instrumentation engineer Devices: Walker;Arm rests Bed/Chair Transfer: 4: Bed > Chair or W/C: Min A (steadying Pt. >  75%);5: Chair or W/C > Bed: Supervision (verbal cues/safety issues)  FIM - Locomotion: Wheelchair  Distance: 120 Locomotion: Wheelchair: 5: Travels 150 ft or more: maneuvers on rugs and over door sills with supervision, cueing or coaxing FIM - Locomotion: Ambulation Locomotion: Ambulation Assistive Devices: Administrator Ambulation/Gait Assistance: 4: Min guard Locomotion: Ambulation: 4: Travels 150 ft or more with minimal assistance (Pt.>75%)  Comprehension Comprehension Mode: Auditory Comprehension: 7-Follows complex conversation/direction: With no assist  Expression Expression Mode: Verbal Expression: 6-Expresses complex ideas: With extra time/assistive device  Social Interaction Social Interaction: 7-Interacts appropriately with others - No medications needed.  Problem Solving Problem Solving: 5-Solves basic 90% of the time/requires cueing < 10% of the time  Memory Memory: 7-Complete Independence: No helper  Medical Problem List and Plan:  Displaced Left patellar fracture secondary to fall 04/21/2013  1. DVT Prophylaxis/Anticoagulation: Pharmaceutical: Lovenox .  2. Chronic abdominal pain/Pain Management: Continue oxycodone and fentanyl for chronic pain. Added neurontin for dysesthesias left knee.   - low dose fentanyl patch  helping with persistent left knee pain (on narcs PTA for abd pain) 3. Anxiety disorder/ Mood: Provide ego support for high levels of anxiety. This will hopefully improve with time, ego support and ongoing therapy. LCSW to follow for evaluation and support.  4. Neuropsych: This patient is capable of making decisions on her own behalf.  5. NSCL cancer/ COPD: Oxygen with activity and at nights. Continue Dulera. Exam stable today--some anxiety 6. DM type 2: Will monitor with bid checks. Continue lantus 35 units bid with SSI for elevated BS.   -fair control at present 7. 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 GI symptoms stable but with poor intake.  Will add supplements with meals.  8. Chronic Leucocytosis: Trend 18.2--->17.5-->19.3 and back to 17.2 today---continue to follow clinically 9. Anemia of chronic disease: stable. hgb 10.9 10. Wound care: pt proficient in ostomy care- no problems or leaks now -staples in left knee- clean and intact. Change daily or as needed 11. CP occ with deep breath, prob rib contusion--NSR on repeat EKG,  -pt denies SOB, palpitations, etc  LOS (Days) 5 A FACE TO FACE EVALUATION WAS PERFORMED  Roy Snuffer E 05/01/2013 7:40 AM

## 2013-05-01 NOTE — Progress Notes (Signed)
Physical Therapy Session Note  Patient Details  Name: [name] MRN: [id number] Date of Birth: [birth year]  Today's Date: 05/01/2013 Time: 1000-1040 Time Calculation (min): 40 min  Skilled Therapeutic Interventions/Progress Updates:    Pt received seated in bedside chair; agreeable to session. Adjusted L KI to address discomfort in LLE. Session focused on initiating floor transfer.   Performed gait x150' in controlled and home environments with rolling walker and min guard. In rehab apartment, explained and demonstrated floor transfer using couch. Pt transferred from seated on couch>long sitting on floor with min A to control descent. Pt then transitioned from long sit position>half kneeling on R knee with L hip abducted, PT providing min guard at L knee (with knee immobilizer on) to maintain L knee extension. Pt attempted to perform transfer from tall kneeling>seated on couch; however, pt with increased anxiety, tearful episode which pt attributed to fear of accidentally flexing L knee. Therapist attempted to calm pt, then provided mod A (lifting assist) to reposition pt into seated on couch. Pt reported no increase in L knee pain post-transfer. After tearful episode ended, performed sit>stand from couch with rolling walker and min A then returned to pt room via gait x150' in controlled/home environments with rolling walker and min guard. Therapist departed with pt seated in bedside chair with bilat LE's elevated, L KI on, and pt in no apparent distress.    Therapy Documentation Precautions:  Precautions Precautions: Fall Required Braces or Orthoses: Knee Immobilizer - Left Knee Immobilizer - Left: On at all times (may undo during ADL) Restrictions Weight Bearing Restrictions: Yes LLE Weight Bearing: Weight bearing as tolerated Other Position/Activity Restrictions: NO ROM LEFT KNEE; quad sets and ankle pumps OK Vital Signs: Oxygen Therapy SpO2: 94 % O2 Device: None (Room  air) Pain: Pain Assessment Pain Assessment: 0-10 Pain Score: 6  Pain Type: Acute pain Pain Location: Knee Pain Orientation: Left Pain Descriptors / Indicators: Aching Pain Onset: On-going Pain Intervention(s): Repositioned;Splinting;Other (Comment) (Adjusted knee immobilizer; per pt, RN aware and had already addressed pain) Multiple Pain Sites: No Locomotion : Ambulation Ambulation/Gait Assistance: 4: Min guard   See FIM for current functional status  Therapy/Group: Individual Therapy  [place], Malva Cogan 05/01/2013, 1:01 PM

## 2013-05-01 NOTE — Progress Notes (Signed)
Physical Therapy Session Note  Patient Details  Name: [name] MRN: [id number] Date of Birth: [birth year]  Today's Date: 05/01/2013 Time: 1400-1455 Time Calculation (min): 55 min  Skilled Therapeutic Interventions/Progress Updates:    Patient received sitting in wheelchair from OT; son, [name], present for family education/training session. Majority of session spent discussing patient's CLOF as well as discharge recommendations in regards to patient's overall mobility. Patient's son clearly stating that 24/7 supervision will not be able to be provided secondary to his employment as a truck driver (travels and away from the home for days at a time) and his wife having to care for 4 children, home-school them, and prepare for her own upcoming surgery. Additionally, patient's son adamant that he believes that d/c to another facility prior to returning home is necessary. Asked patient and patient's son about discharge preferences if goals were modified to intermittent supervision and patient's son continues to state desire for SNF placement.  Remainder of session focused on family education with functional mobility with emphasis on functional transfers, ambulation, stair negotiation, and car transfers. Education and demonstration provided by therapist for proper set up, safety, sequencing, technique, and guarding for functional transfers, ambulation, stair negotiation, and car transfers. Patient's son agreeable to observe, however, states he does not want to participate because "if she goes to another facility, she won't have to do all this until she is stronger". Attempted/encouraged patient's son to participate in hands on training, however, son repeatedly declines. Patient performed gait training x115' with RW and supervision, car transfer to car of low height with RW and leg lifter and supervision, and negotiation of 4 steps without handrails (ascends backwards, descends forwards) and with RW, minA  required for stabilization of RW and for hand on patient's upper back to maintain patient's BOS anteriorly to prevent LOB posteriorly.   Followed up with social worker about potential change in d/c plans and social worker aware.  Therapy Documentation Precautions:  Precautions Precautions: Fall Required Braces or Orthoses: Knee Immobilizer - Left Knee Immobilizer - Left: On at all times Restrictions Weight Bearing Restrictions: Yes LLE Weight Bearing: Weight bearing as tolerated Other Position/Activity Restrictions: NO ROM LEFT KNEE; quad sets and ankle pumps OK Pain: Pain Assessment Pain Score: 6  Locomotion : Ambulation Ambulation/Gait Assistance: 5: Supervision   See FIM for current functional status  Therapy/Group: Individual Therapy  Lillia Abed. Keyonni Percival, PT, DPT 05/01/2013, 2:47 PM

## 2013-05-02 ENCOUNTER — Inpatient Hospital Stay (HOSPITAL_COMMUNITY): Payer: Medicare Other | Admitting: *Deleted

## 2013-05-02 LAB — GLUCOSE, CAPILLARY
GLUCOSE-CAPILLARY: 146 mg/dL — AB (ref 70–99)
Glucose-Capillary: 176 mg/dL — ABNORMAL HIGH (ref 70–99)
Glucose-Capillary: 218 mg/dL — ABNORMAL HIGH (ref 70–99)
Glucose-Capillary: 227 mg/dL — ABNORMAL HIGH (ref 70–99)

## 2013-05-02 NOTE — Plan of Care (Signed)
Problem: RH PAIN MANAGEMENT Goal: RH STG PAIN MANAGED AT OR BELOW PT'S PAIN GOAL 6 or less on scale of 1-10  Outcome: Not Progressing 7-8 pain level despite medications in use. Duragesic patch to left shoulder 12.5 every 72 hours, oxycodone 20 mg po every 3 hours as needed asking before 3 hours up, robaxin 500 mg po every 6 hours as needed, declining tylenol and ultram secondary to nausea.

## 2013-05-02 NOTE — Progress Notes (Signed)
Physical Therapy Session Note  Patient Details  Name: [name] MRN: [id number] Date of Birth: 23-Apr-1950  Today's Date: 05/02/2013 Time: 1400-1455 Time Calculation (min): 55 min  Skilled Therapeutic Interventions/Progress Updates:    Patient received sitting in recliner. Session focused on Easter Egg hunt to address activity tolerance, functional transfers, ambulation, balance, reaching outside BOS, unsupported standing balance, environmental scanning, RW management, and safety. Additionally, patient participated in Easter-themed ball toss with letter ball with emphasis on standing/sitting balance and cognitive remediation tasks to name Ivor Costa themes starting with letters on ball. Patient performed all activities at supervision level with use of RW.  Therapy Documentation Precautions:  Precautions Precautions: Fall Required Braces or Orthoses: Knee Immobilizer - Left Knee Immobilizer - Left: On at all times Restrictions Weight Bearing Restrictions: Yes LLE Weight Bearing: Weight bearing as tolerated Other Position/Activity Restrictions: NO ROM LEFT KNEE; quad sets and ankle pumps OK Pain: Pain Assessment Pain Assessment: 0-10 Pain Score: 8  Pain Type: Acute pain Pain Location: Knee Pain Orientation: Left Pain Descriptors / Indicators: Aching Pain Frequency: Constant Pain Onset: Gradual Patients Stated Pain Goal: 5 Pain Intervention(s): Medication (See eMAR);Repositioned Multiple Pain Sites: No Locomotion : Ambulation Ambulation/Gait Assistance: 5: Supervision   See FIM for current functional status  Therapy/Group: Individual Therapy  Lillia Abed. Chelsy Parrales, PT, DPT 05/02/2013, 3:02 PM

## 2013-05-02 NOTE — Progress Notes (Signed)
Physical Therapy Note  Patient Details  Name: [name] MRN: [id number] Date of Birth: [birth year] Today's Date: 05/02/2013  Patient received sitting in recliner. Patient with c/o nausea all day and states she does not want to do therapy. Patient encouraged/motived by therapist to participate, however, patient becomes tearful stating that she just "doesn't feel well enough." Informed patient that she is scheduled to participate in group therapy session/Easter Egg hunt later this PM; patient states she would like to rest to see if she can participate later. Will follow up as able.   [place] Tawania Daponte, PT, DPT 05/02/2013, 1:17 PM

## 2013-05-02 NOTE — Progress Notes (Signed)
[facility] PHYSICAL MEDICINE & REHABILITATION     PROGRESS NOTE    Subjective/Complaints: Pain control adequate to allow therapy participation On chronic narcotics at home A 12 point review of systems has been performed and if not noted above is otherwise negative.   Objective: Vital Signs: Blood pressure 133/56, pulse 54, temperature 98.1 F (36.7 C), temperature source Oral, resp. rate 18, weight 86.2 kg (190 lb 0.6 oz), SpO2 99.00%. No results found. No results found for this basename: WBC, HGB, HCT, PLT,  in the last 72 hours No results found for this basename: NA, K, CL, CO, GLUCOSE, BUN, CREATININE, CALCIUM,  in the last 72 hours CBG (last 3)   Recent Labs  05/01/13 1125 05/01/13 1638 05/01/13 2113  GLUCAP 177* 166* 180*    Wt Readings from Last 3 Encounters:  04/28/13 86.2 kg (190 lb 0.6 oz)  04/22/13 81.9 kg (180 lb 8.9 oz)  04/22/13 81.9 kg (180 lb 8.9 oz)    Physical Exam:  Constitutional: She is oriented to person, place, and time.  HENT: dentition poor, oral mucosa pink and moist  Head: Normocephalic.  Eyes: EOM are normal.  Neck: Normal range of motion. Neck supple. No thyromegaly present.  Cardiovascular: ?bigeminy rhythm Respiratory: Effort normal and breath sounds normal. No respiratory distress. No wheezes GI: Soft. Bowel sounds are normal. She exhibits no distension.  Colostomy tube in place, no leaks. Stool with expected form Neurological: She is alert and oriented to person, place, and time.   motor strength 5/5 in bilateral deltoid, bicep, tricep, grip,  4/5 in the right hip flexor, ksssssssnot tested secondary to pain left ankle dorsiflexor plantar flexor 3 minus secondary to pain  Knee immobilizer fitting appropriately Extremity no evidence of calf edema on the right side   Hypersensitive to touch. Minimal edema.  Psych: a little anxious, pleasant Skin:  Incision Left knee CDI, small ecchymosis mid patellar  Assessment/Plan: 1. Functional  deficits secondary to left patellar fx which require 3+ hours per day of interdisciplinary therapy in a comprehensive inpatient rehab setting. Physiatrist is providing close team supervision and 24 hour management of active medical problems listed below. Physiatrist and rehab team continue to assess barriers to discharge/monitor patient progress toward functional and medical goals. FIM: FIM - Bathing Bathing Steps Patient Completed: Chest;Right Arm;Left Arm;Abdomen;Front perineal area;Buttocks;Right upper leg;Right lower leg (including foot) Bathing: 4: Min-Patient completes 8-9 3f 10 parts or 75+ percent  FIM - Upper Body Dressing/Undressing Upper body dressing/undressing steps patient completed: Thread/unthread right bra strap;Thread/unthread left bra strap;Thread/unthread right sleeve of pullover shirt/dresss;Thread/unthread left sleeve of pullover shirt/dress;Put head through opening of pull over shirt/dress;Pull shirt over trunk Upper body dressing/undressing: 4: Min-Patient completed 75 plus % of tasks FIM - Lower Body Dressing/Undressing Lower body dressing/undressing steps patient completed: Thread/unthread right underwear leg;Thread/unthread left underwear leg;Pull underwear up/down;Thread/unthread right pants leg;Pull pants up/down Lower body dressing/undressing: 3: Mod-Patient completed 50-74% of tasks  FIM - Toileting Toileting steps completed by patient: Adjust clothing prior to toileting;Performs perineal hygiene;Adjust clothing after toileting Toileting Assistive Devices: Grab bar or rail for support Toileting: 6: Assistive device: No helper  FIM - Radio producer Devices: Insurance account manager Transfers: 4-To toilet/BSC: Min A (steadying Pt. > 75%);5-From toilet/BSC: Supervision (verbal cues/safety issues)  FIM - Control and instrumentation engineer Devices: Walker;Arm rests;Orthosis (L knee immobilizer) Bed/Chair Transfer: 5: Bed > Chair or  W/C: Supervision (verbal cues/safety issues);5: Chair or W/C > Bed: Supervision (verbal cues/safety issues)  FIM - Locomotion:  Wheelchair Distance: 120 Locomotion: Wheelchair: 0: Activity did not occur FIM - Locomotion: Ambulation Locomotion: Ambulation Assistive Devices: Administrator Ambulation/Gait Assistance: 5: Supervision Locomotion: Ambulation: 2: Travels 60 - 149 ft with supervision/safety issues  Comprehension Comprehension Mode: Auditory Comprehension: 7-Follows complex conversation/direction: With no assist  Expression Expression Mode: Verbal Expression: 6-Expresses complex ideas: With extra time/assistive device  Social Interaction Social Interaction: 7-Interacts appropriately with others - No medications needed.  Problem Solving Problem Solving: 5-Solves basic 90% of the time/requires cueing < 10% of the time  Memory Memory: 7-Complete Independence: No helper  Medical Problem List and Plan:  Displaced Left patellar fracture secondary to fall 04/21/2013  1. DVT Prophylaxis/Anticoagulation: Pharmaceutical: Lovenox .  2. Chronic abdominal pain/Pain Management: Continue oxycodone and fentanyl for chronic pain. Added neurontin for dysesthesias left knee.   - low dose fentanyl patch  helping with persistent left knee pain (on narcs PTA for abd pain) 3. Anxiety disorder/ Mood: Provide ego support for high levels of anxiety. This will hopefully improve with time, ego support and ongoing therapy. LCSW to follow for evaluation and support.  4. Neuropsych: This patient is capable of making decisions on her own behalf.  5. NSCL cancer/ COPD: Oxygen with activity and at nights. Continue Dulera. Exam stable today--some anxiety 6. DM type 2: Will monitor with bid checks. Continue lantus 35 units bid with SSI for elevated BS.   -fair control at present 7. 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 GI symptoms stable but with poor intake. Will add supplements with meals.  8.  Chronic Leucocytosis: Trend 18.2--->17.5-->19.3 and back to 17.2 today---continue to follow clinically 9. Anemia of chronic disease: stable. hgb 10.9    LOS (Days) 6 A FACE TO FACE EVALUATION WAS PERFORMED  Charlett Blake 05/02/2013 6:58 AM

## 2013-05-03 ENCOUNTER — Inpatient Hospital Stay (HOSPITAL_COMMUNITY): Payer: Medicare Other

## 2013-05-03 ENCOUNTER — Inpatient Hospital Stay (HOSPITAL_COMMUNITY): Payer: Medicare Other | Admitting: Physical Therapy

## 2013-05-03 DIAGNOSIS — F411 Generalized anxiety disorder: Secondary | ICD-10-CM | POA: Diagnosis present

## 2013-05-03 DIAGNOSIS — W19XXXA Unspecified fall, initial encounter: Secondary | ICD-10-CM

## 2013-05-03 DIAGNOSIS — S82009A Unspecified fracture of unspecified patella, initial encounter for closed fracture: Secondary | ICD-10-CM

## 2013-05-03 LAB — GLUCOSE, CAPILLARY
GLUCOSE-CAPILLARY: 161 mg/dL — AB (ref 70–99)
GLUCOSE-CAPILLARY: 223 mg/dL — AB (ref 70–99)
GLUCOSE-CAPILLARY: 153 mg/dL — AB (ref 70–99)
Glucose-Capillary: 207 mg/dL — ABNORMAL HIGH (ref 70–99)

## 2013-05-03 MED ORDER — FENTANYL 25 MCG/HR TD PT72
25.0000 ug | MEDICATED_PATCH | TRANSDERMAL | Status: DC
  Administered 2013-05-03: 25 ug via TRANSDERMAL
  Filled 2013-05-03: qty 1

## 2013-05-03 MED ORDER — TRAMADOL HCL 50 MG PO TABS: 50.0000 mg | ORAL_TABLET | Freq: Two times a day (BID) | ORAL | Status: DC | PRN

## 2013-05-03 MED ORDER — INSULIN GLARGINE 100 UNIT/ML ~~LOC~~ SOLN: 35.0000 [IU] | Freq: Two times a day (BID) | SUBCUTANEOUS | Status: DC

## 2013-05-03 MED ORDER — GLUCERNA SHAKE PO LIQD: 237.0000 mL | Freq: Three times a day (TID) | ORAL | Status: DC

## 2013-05-03 MED ORDER — GABAPENTIN 100 MG PO CAPS: 100.0000 mg | ORAL_CAPSULE | Freq: Every day | ORAL | Status: DC

## 2013-05-03 MED ORDER — ONDANSETRON HCL 4 MG PO TABS: 4.0000 mg | ORAL_TABLET | Freq: Four times a day (QID) | ORAL | Status: DC | PRN

## 2013-05-03 MED ORDER — FENTANYL 25 MCG/HR TD PT72: 25.0000 ug | MEDICATED_PATCH | TRANSDERMAL | Status: DC

## 2013-05-03 MED ORDER — INSULIN GLARGINE 100 UNIT/ML ~~LOC~~ SOLN
40.0000 [IU] | Freq: Two times a day (BID) | SUBCUTANEOUS | Status: DC
  Administered 2013-05-03 – 2013-05-04 (×2): 40 [IU] via SUBCUTANEOUS
  Filled 2013-05-03 (×4): qty 0.4

## 2013-05-03 MED ORDER — POLYETHYLENE GLYCOL 3350 17 G PO PACK: 17.0000 g | PACK | Freq: Every day | ORAL | Status: DC | PRN

## 2013-05-03 MED ORDER — METHOCARBAMOL 500 MG PO TABS: 500.0000 mg | ORAL_TABLET | Freq: Four times a day (QID) | ORAL | Status: DC | PRN

## 2013-05-03 MED ORDER — DIAZEPAM 5 MG PO TABS: 5.0000 mg | ORAL_TABLET | Freq: Three times a day (TID) | ORAL | Status: DC | PRN

## 2013-05-03 MED ORDER — OXYCODONE HCL 20 MG PO TABS
20.0000 mg | ORAL_TABLET | ORAL | Status: DC | PRN
Start: 2013-05-03 — End: 2013-05-20

## 2013-05-03 MED ORDER — TRAZODONE HCL 100 MG PO TABS: 200.0000 mg | ORAL_TABLET | Freq: Every day | ORAL | Status: DC

## 2013-05-03 NOTE — Progress Notes (Signed)
Social Work Patient ID: [name], female   DOB: 12/08/1950, 63 y.o.   MRN: [id number]  Received SNF bed offer today from [facility] (pt's preferred facility - very close to her home).  Pt accepting bed with transfer planned for tomorrow.  Have alerted tx team.  Still awaiting word from facility as to time of admit.  Have explained to pt that she will need to speak with family about providing transportation for her to facility as she does not meet ambulance criteria.    Rhyker Silversmith, LCSW

## 2013-05-03 NOTE — Progress Notes (Signed)
[place] PHYSICAL MEDICINE & REHABILITATION     PROGRESS NOTE    Subjective/Complaints: Pain stil an issue (9/10)   A 12 point review of systems has been performed and if not noted above is otherwise negative.   Objective: Vital Signs: Blood pressure 160/63, pulse 66, temperature 99.1 F (37.3 C), temperature source Oral, resp. rate 18, weight 86.2 kg (190 lb 0.6 oz), SpO2 92.00%. No results found. No results found for this basename: WBC, HGB, HCT, PLT,  in the last 72 hours No results found for this basename: NA, K, CL, CO, GLUCOSE, BUN, CREATININE, CALCIUM,  in the last 72 hours CBG (last 3)   Recent Labs  05/02/13 1634 05/02/13 2107 05/03/13 0732  GLUCAP 218* 227* 161*    Wt Readings from Last 3 Encounters:  04/28/13 86.2 kg (190 lb 0.6 oz)  04/22/13 81.9 kg (180 lb 8.9 oz)  04/22/13 81.9 kg (180 lb 8.9 oz)    Physical Exam:  Constitutional: She is oriented to person, place, and time.  HENT: dentition poor, oral mucosa pink and moist  Head: Normocephalic.  Eyes: EOM are normal.  Neck: Normal range of motion. Neck supple. No thyromegaly present.  Cardiovascular: ?bigeminy rhythm Respiratory: Effort normal and breath sounds normal. No respiratory distress. No wheezes GI: Soft. Bowel sounds are normal. She exhibits no distension.  Colostomy tube site clean and intact Neurological: She is alert and oriented to person, place, and time.   motor strength 5/5 in bilateral deltoid, bicep, tricep, grip,  4/5 in the right hip flexor, ksssssssnot tested secondary to pain left ankle dorsiflexor plantar flexor 3 minus secondary to pain  Knee immobilizer fitting appropriately Extremity no evidence of calf edema on the right side   Hypersensitive to touch. Minimal edema.  Psych: a little anxious, pleasant Skin:  Incision Left knee CDI, small ecchymosis mid patellar  Assessment/Plan: 1. Functional deficits secondary to left patellar fx which require 3+ hours per day of  interdisciplinary therapy in a comprehensive inpatient rehab setting. Physiatrist is providing close team supervision and 24 hour management of active medical problems listed below. Physiatrist and rehab team continue to assess barriers to discharge/monitor patient progress toward functional and medical goals. FIM: FIM - Bathing Bathing Steps Patient Completed: Chest;Right Arm;Left Arm;Abdomen;Front perineal area;Buttocks;Right upper leg;Right lower leg (including foot) Bathing: 4: Min-Patient completes 8-9 26f 10 parts or 75+ percent  FIM - Upper Body Dressing/Undressing Upper body dressing/undressing steps patient completed: Thread/unthread right bra strap;Thread/unthread left bra strap;Thread/unthread right sleeve of pullover shirt/dresss;Thread/unthread left sleeve of pullover shirt/dress;Put head through opening of pull over shirt/dress;Pull shirt over trunk Upper body dressing/undressing: 4: Min-Patient completed 75 plus % of tasks FIM - Lower Body Dressing/Undressing Lower body dressing/undressing steps patient completed: Thread/unthread right underwear leg;Thread/unthread left underwear leg;Pull underwear up/down;Thread/unthread right pants leg;Pull pants up/down Lower body dressing/undressing: 3: Mod-Patient completed 50-74% of tasks  FIM - Toileting Toileting steps completed by patient: Adjust clothing prior to toileting;Performs perineal hygiene;Adjust clothing after toileting Toileting Assistive Devices: Grab bar or rail for support Toileting: 6: Assistive device: No helper  FIM - Radio producer Devices: Mining engineer Transfers: 5-To toilet/BSC: Supervision (verbal cues/safety issues)  FIM - Control and instrumentation engineer Devices: Environmental consultant;Arm rests;Orthosis (L knee immobilizer) Bed/Chair Transfer: 5: Chair or W/C > Bed: Supervision (verbal cues/safety issues);5: Bed > Chair or W/C: Supervision (verbal cues/safety  issues)  FIM - Locomotion: Wheelchair Distance: 120 Locomotion: Wheelchair: 1: Total Assistance/staff pushes wheelchair (Pt<25%) FIM - Locomotion: Ambulation  Locomotion: Ambulation Assistive Devices: Administrator Ambulation/Gait Assistance: 5: Supervision Locomotion: Ambulation: 5: Travels 150 ft or more with supervision/safety issues  Comprehension Comprehension Mode: Auditory Comprehension: 6-Follows complex conversation/direction: With extra time/assistive device  Expression Expression Mode: Verbal Expression: 6-Expresses complex ideas: With extra time/assistive device  Social Interaction Social Interaction: 5-Interacts appropriately 90% of the time - Needs monitoring or encouragement for participation or interaction.  Problem Solving Problem Solving: 6-Solves complex problems: With extra time  Memory Memory: 6-More than reasonable amt of time  Medical Problem List and Plan:  Displaced Left patellar fracture secondary to fall 04/21/2013  1. DVT Prophylaxis/Anticoagulation: Pharmaceutical: Lovenox .  2. Chronic abdominal pain/Pain Management: Continue oxycodone and fentanyl for chronic pain. Added neurontin for dysesthesias left knee.   - increase fentanyl patch to 53mcg  helping with persistent left knee pain (on narcs PTA for abd pain) 3. Anxiety disorder/ Mood: Provide ego support for high levels of anxiety. This will hopefully improve with time, ego support and ongoing therapy. LCSW to follow for evaluation and support.  4. Neuropsych: This patient is capable of making decisions on her own behalf.  5. NSCL cancer/ COPD: Oxygen with activity and at nights. Continue Dulera. Exam stable today--some anxiety 6. DM type 2: Will monitor with bid checks. Continue lantus 35 units bid with SSI for elevated BS.   -fair control at present 7. 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 GI symptoms stable but with poor intake. Will add supplements with meals.  8. Chronic  Leucocytosis: Trend  down 9. Anemia of chronic disease: stable. hgb 10.9    LOS (Days) 7 A FACE TO FACE EVALUATION WAS PERFORMED  [name] T 05/03/2013 8:14 AM

## 2013-05-03 NOTE — Discharge Summary (Signed)
Physician Discharge Summary  Patient ID: [name] MRN: [id number] DOB/AGE: [birth year] 63 y.o.  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 due to alcohol   Anxiety state, unspecified   Discharged Condition: Stable    Labs:  Basic Metabolic Panel: BMET    Component Value Date/Time   NA 133* 04/27/2013 0640   NA 140 04/13/2013 1447   K 5.2 04/27/2013 0640   K 4.7 04/13/2013 1447   CL 99 04/27/2013 0640   CL 103 03/23/2012 0938   CO2 20 04/27/2013 0640   CO2 22 04/13/2013 1447   GLUCOSE 147* 04/27/2013 0640   GLUCOSE 121 04/13/2013 1447   GLUCOSE 319* 03/23/2012 0938   BUN 22 04/27/2013 0640   BUN 15.9 04/13/2013 1447   CREATININE 1.06 04/27/2013 0640   CREATININE 1.0 04/13/2013 1447   CALCIUM 8.8 04/27/2013 0640   CALCIUM 10.1 04/13/2013 1447   GFRNONAA 55* 04/27/2013 0640   GFRAA 64* 04/27/2013 0640      CBC:  Recent Labs Lab 04/28/13 0226  WBC 16.6*  HGB 11.0*  HCT 34.1*  MCV 84.6  PLT 463*    CBG:  Recent Labs Lab 05/03/13 0732 05/03/13 1129 05/03/13 1722 05/03/13 2057 05/04/13 0720  GLUCAP 161* 207* 223* 153* 107*    Brief HPI:   [name] is a 63 y.o. 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 She was admitted 04/21/2013 after mechanical fall without loss of consciousness. X-rays and imaging revealed comminuted left patella fracture with hemarthrosis and she underwent ORIF left patella on 04/22/2013 per Dr. Veverly Fells. Is WBAT with knee immobilizer at all times--No ROM left knee. She has had problems with chronic abdominal pain as well as Left knee pain and anxiety with mobility.  CIR was recommended due to slow progress.     Hospital Course: [name] was admitted to rehab 04/26/2013 for inpatient therapies to consist of PT, ST and OT at least three hours five days a week. Past admission physiatrist, therapy team and rehab RN have worked together to provide customized collaborative inpatient rehab. Blood pressures have been monitored on bid basis and continue to be variable. Elevated levels of anxiety have improved but pain control remains an issue.  Fentanyl was increased to 25 mcg on 05/03/13.  Respiratory status has been stable. Blood sugars have been on upward trend in last 24 hours and  Lantus was increased to 40 units bid. Her left knee incision has been healing well with decrease in edema. Central area of blistering is stable with ischemia and no skin loss noted. She reported intermittent nose bleed and was started on afrin. To be used X three days the use saline nasal spray daily to prevent irritation of nares.  Staples remain intact and incision is clean and dry.  She continues to requires assistance and has decided on continued therapies at SNF.  On 05/04/13, she is discharged to [facility] in improved condition.    Rehab course: During patient's stay in rehab weekly team conferences were held to monitor patient's progress, set goals and discuss barriers to discharge. Patient has had improvement in activity tolerance, balance, postural control, as well as ability to compensate for deficits. She is able  to complete bathing and dressing UB with min assist. She requires mod assist for LB dressing. She is able to perform transfers with supervision and is able to ambulate 200 feet X 2 with RW/ supervision as well as  intermittent rest breaks. She requires min cues for safe walker use as well as min assist with cues for safety with obstacle navigation.   Disposition:  Skilled Nursing Facility  Diet: Diabetic.   Special Instructions: 1. Cleanse incision with soap and water. Pat dry and paint with betadine.  2. Follow up with Dr.  Veverly Fells in 5-7 days for post op check and staple removal.  3. WBAT LLE. Continue Knee immobilizer at all times. No ROM left knee.  4. Check cbgs ac/hs. Use SSI per SNF protocol for elevated BS.  5. Use Afrin nose spray X three days. Then change to saline nasal spray bid to prevent irritation of nares.    Future Appointments Provider Department Dept Phone   06/22/2013 1:15 PM [facility] [phone]   06/22/2013 1:45 PM Curt Bears, MD [facility] [phone]   06/22/2013 2:15 PM [facility] [phone]   06/25/2013 10:20 AM Meredith Staggers, MD [facility] [phone]   10/13/2013 10:45 AM [name], MD [facility]       Medication List    STOP taking these medications       furosemide 20 MG tablet  Commonly known as:  LASIX     insulin lispro 100 UNIT/ML injection  Commonly known as:  HUMALOG     metFORMIN 1000 MG tablet  Commonly known as:  GLUCOPHAGE     potassium chloride 10 MEQ CR capsule  Commonly known as:  MICRO-K     promethazine 25 MG tablet  Commonly known as:  PHENERGAN      TAKE these medications       albuterol-ipratropium 18-103 MCG/ACT inhaler  Commonly known as:  COMBIVENT  Inhale 2 puffs into the lungs every 4 (four) hours as needed for wheezing or shortness of breath.     aspirin 325 MG tablet  Take 1 tablet (325 mg total) by mouth daily.     diazepam 5 MG tablet--Rx # 10 pills  Commonly known as:  VALIUM  Take 1 tablet (5 mg total) by mouth 3 (three) times daily as needed for anxiety.     dicyclomine 10 MG capsule  Commonly known as:  BENTYL  TAKE ONE CAPSULE BY MOUTH THREE TIMES DAILY     esomeprazole 40 MG capsule  Commonly known as:  NEXIUM  Take 40 mg by mouth every evening.     feeding supplement (GLUCERNA SHAKE) Liqd  Take 237 mLs by  mouth 3 (three) times daily between meals.     fentaNYL 25 MCG/HR patch--Rx # 2 patches  Commonly known as:  DURAGESIC - dosed mcg/hr  Place 1 patch (25 mcg total) onto the skin every 3 (three) days.     Fluticasone-Salmeterol 250-50 MCG/DOSE Aepb  Commonly known as:  ADVAIR  Inhale 1 puff into the lungs every 12 (twelve) hours.     folic acid 1 MG tablet  Commonly known as:  FOLVITE  Take 1 tablet (1 mg total) by mouth every evening.     gabapentin 100 MG capsule  Commonly known as:  NEURONTIN  Take 1 capsule (100 mg total) by mouth at  bedtime.     insulin glargine 100 UNIT/ML injection  Commonly known as:  LANTUS  Inject 0.4 mLs (40 Units total) into the skin 2 (two) times daily.     Insulin Pen Needle 32G X 4 MM Misc  Commonly known as:  BD PEN NEEDLE NANO U/F  Use 4 times daily as directed.     methocarbamol 500 MG tablet  Commonly known as:  ROBAXIN  Take 1 tablet (500 mg total) by mouth every 6 (six) hours as needed for muscle spasms.     metoCLOPramide 10 MG tablet  Commonly known as:  REGLAN  Take 1 tablet (10 mg total) by mouth 3 (three) times daily.     ondansetron 4 MG tablet  Commonly known as:  ZOFRAN  Take 1 tablet (4 mg total) by mouth every 6 (six) hours as needed for nausea.     Oxycodone HCl 20 MG Tabs--Rx # 30 pills   Take 1 tablet (20 mg total) by mouth every 3 (three) hours as needed (pain).     polyethylene glycol packet  Commonly known as:  MIRALAX / GLYCOLAX  Take 17 g by mouth daily as needed for mild constipation.     rifaximin 550 MG Tabs tablet  Commonly known as:  XIFAXAN  Take 550 mg by mouth 2 (two) times daily.     traMADol 50 MG tablet--Rx #10 pills  Commonly known as:  ULTRAM  Take 1 tablet (50 mg total) by mouth every 12 (twelve) hours as needed for moderate pain.     traZODone 100 MG tablet---Rx # 14 pills  Commonly known as:  DESYREL  Take 2 tablets (200 mg total) by mouth at bedtime.       Follow-up Information   Follow  up with Meredith Staggers, MD On 06/25/2013. (Be there at 10 am  for 10:20 am appointment )    Specialty:  Physical Medicine and Rehabilitation   Contact information:   [address], [place] [address] [phone]       Follow up with Augustin Schooling, MD. Call today. (for follow up appointment)    Specialty:  Orthopedic Surgery   Contact information:   [address] [place] [address] [phone]       Signed: Bary Leriche 05/04/2013, 9:48 AM

## 2013-05-03 NOTE — Progress Notes (Signed)
Occupational Therapy Session Note  Patient Details  Name: [name] MRN: [id number] Date of Birth: 1951-01-08  Today's Date: 05/03/2013 Time: 0830-0930 Time Calculation (min): 60 min  Short Term Goals: Week 1:  OT Short Term Goal 1 (Week 1): STG=LTG due to anticipated short length of stay  Skilled Therapeutic Interventions/Progress Updates: ADL-retraining with focus on endurance, dynamic standing balance, functional mobility using RW, safety awareness, short-term memory, and adapted dressing skills.  Patient received in her recliner, physical therapist present with patient reporting poor sleep and acute pain.   Patient completed self-feeding and related symptoms of anxiety and poor sleep due to misperception of intent with family training.   OT re-educated patient on goals of treatment and her current functional status (supervision-min assist) with acknowledgement of her son's report of inadequate supervision available at home.   Patient verbalized understanding and proceeded through ADL as instructed after requesting to toilet first.   Patient performed transfer from recliner to [place] with min guard and ambulated in room using RW but remained distracted and required verbal cues to sustain focus on tasks and to attend to cues for safety.   Patient completed toileting unassisted and returned to sink for pan bath with only setup assist and supervision for safety.   Patient completed bathing but required mod assist for dressing due to not acquiring AE as advised to dress LLE.   Patient completed toileting, colostomy care, bathing and dressing with setup and supervision within 55 min w/o rest break or evidence of LOB although requiring supervision to sustain attention and attend to safety cues.   Patient left in recliner, call light placed with reach and new ice pack applied to left knee.     Therapy Documentation Precautions:  Precautions Precautions: Fall Required Braces or Orthoses: Knee  Immobilizer - Left Knee Immobilizer - Left: On at all times Restrictions Weight Bearing Restrictions: Yes LLE Weight Bearing: Weight bearing as tolerated Other Position/Activity Restrictions: NO ROM LEFT KNEE; quad sets and ankle pumps OK  Pain: Pain Assessment Pain Assessment: 0-10 Pain Score: 8  Pain Type: Acute pain Pain Location: Knee Pain Orientation: Left Pain Descriptors / Indicators: Aching Pain Frequency: Constant Pain Onset: With Activity Pain Intervention(s): Medication (See eMAR);Cold applied;Distraction;Elevated extremity  ADL: ADL ADL Comments: see FIM  See FIM for current functional status  Therapy/Group: Individual Therapy  [place] 05/03/2013, 10:01 AM

## 2013-05-03 NOTE — Progress Notes (Signed)
Physical Therapy Session Note  Patient Details  Name: [name] MRN: [id number] Date of Birth: 06-14-1950  Today's Date: 05/03/2013 Time: 8144-8185 Time Calculation (min): 43 min  Skilled Therapeutic Interventions/Progress Updates:    Ambulation controlled and home environment supervision with RW with forwards/backwards/sidestepping over tiled and carpeted surfaces, cues still needed for RW management and safety. Floor transfer performed with min assist to floor, mod assist from floor. Discussed when to call 911 and when to attempt floor transfer. Standing balance on compliant surface without UE support dancing with Wii game - min assist. Pt's balance strategies progressively got better with increased time and practice.   Therapy Documentation Precautions:  Precautions Precautions: Fall Required Braces or Orthoses: Knee Immobilizer - Left Knee Immobilizer - Left: On at all times Restrictions Weight Bearing Restrictions: Yes LLE Weight Bearing: Weight bearing as tolerated Other Position/Activity Restrictions: NO ROM LEFT KNEE; quad sets and ankle pumps OK Pain:  9/10 Lt knee, premedicated  See FIM for current functional status  Therapy/Group: Individual Therapy  [place] Rocker 05/03/2013, 3:06 PM

## 2013-05-03 NOTE — Progress Notes (Signed)
Social Work Patient ID: [name], female   DOB: 1950-02-26, 63 y.o.   MRN: [id number]  Have confirmed with pt today that, over past weekend, son did come in for therapy and they made a mutual decision to change d/c plan to SNF.  Pt very agreeable with this and feels her daughter-in-law cannot provide the assistance she will need at home.  FL2 to be sent out to area facilities today.  Bobbyjoe Pabst, LCSW

## 2013-05-03 NOTE — Progress Notes (Signed)
Physical Therapy Session Note  Patient Details  Name: [name] MRN: [id number] Date of Birth: 10-16-1950  Today's Date: 05/03/2013 Time: 0730-0830 Time Calculation (min): 60 min  Skilled Therapeutic Interventions/Progress Updates:   Discussed weekend events and son/daughter-in-law/pt's decision that they would not be able to provide supervision with all standing mobility. They report they will not even be able to provide supervision if pt able to spend several hours in w/c at a time by herself.   Ambulation 2 x 200' with supervision and RW, min cues for proximity of RW - intermittent standing restbreaks needed for pain and decreased endurance. Obstacle course x 4 bouts stepping over obstacles, negotiating obstacles, ambulating over compliant surface, stairs (up backwards, down forwards) with RW and min assist - decreased recall of instructed throughout. Pt did not recall floor transfer performed over weekend however this is documented. Memory appears to be a bit more of an issue than previous sessions. Min assist for posterior loss of balance x 2 occurences with standing balance with pt removes both hands off RW. Pt demonstrates decreased safety awareness at times and continues to need supervision with standing mobility   Second Session Time:  1030-1100 Time Calculation (min): 30 min Skilled Therapeutic Interventions/Progress Updates:  Session focused on bed mobility using leg lifter (supervision) with increased time and mod/max sequencing cues. Supine bil LE there-ex: ankle pumps (x50); Bil quad sets (3x10reps with PT facilitation of Lt quads); Lt hip abduction/adduction + quad set (3 x 10 reps AAROM); Straight leg raise (2 x 5 reps AAROM). Ambulation x 175' with RW and close supervision focusing on monitoring self for fatigue and making it back to room prior to needing seated rest break. Mod verbal cues needed for identifying turning point.   Therapy Documentation Precautions:   Precautions Precautions: Fall Required Braces or Orthoses: Knee Immobilizer - Left Knee Immobilizer - Left: On at all times Restrictions Weight Bearing Restrictions: Yes LLE Weight Bearing: Weight bearing as tolerated Other Position/Activity Restrictions: NO ROM LEFT KNEE; quad sets and ankle pumps OK Pain: Pain Assessment Pain Assessment: 0-10 Pain Score: 9 both sessions Pain Type: Surgical pain;Acute pain Pain Location: Knee Pain Orientation: Left Pain Descriptors / Indicators: Aching Pain Frequency: Constant Pain Onset: On-going Pain Intervention(s): repositioned - RN aware. Pt on scheduled medication  See FIM for current functional status  Therapy/Group: Individual Therapy both sessions  [place] Rocker 05/03/2013, 12:11 PM

## 2013-05-04 ENCOUNTER — Inpatient Hospital Stay (HOSPITAL_COMMUNITY): Payer: Medicare Other | Admitting: Physical Therapy

## 2013-05-04 ENCOUNTER — Inpatient Hospital Stay (HOSPITAL_COMMUNITY): Payer: Medicare Other

## 2013-05-04 ENCOUNTER — Ambulatory Visit: Payer: Medicare Other | Admitting: Internal Medicine

## 2013-05-04 DIAGNOSIS — S82009A Unspecified fracture of unspecified patella, initial encounter for closed fracture: Secondary | ICD-10-CM

## 2013-05-04 DIAGNOSIS — W19XXXA Unspecified fall, initial encounter: Secondary | ICD-10-CM

## 2013-05-04 LAB — GLUCOSE, CAPILLARY
Glucose-Capillary: 107 mg/dL — ABNORMAL HIGH (ref 70–99)
Glucose-Capillary: 177 mg/dL — ABNORMAL HIGH (ref 70–99)

## 2013-05-04 MED ORDER — OXYMETAZOLINE HCL 0.05 % NA SOLN: 1.0000 | Freq: Two times a day (BID) | NASAL | Status: AC

## 2013-05-04 MED ORDER — OXYMETAZOLINE HCL 0.05 % NA SOLN
1.0000 | Freq: Two times a day (BID) | NASAL | Status: DC
  Administered 2013-05-04: 1 via NASAL
  Filled 2013-05-04: qty 15

## 2013-05-04 MED ORDER — INSULIN GLARGINE 100 UNIT/ML ~~LOC~~ SOLN: 40.0000 [IU] | Freq: Two times a day (BID) | SUBCUTANEOUS | Status: DC

## 2013-05-04 NOTE — Progress Notes (Signed)
Occupational Therapy Session and Discharge Summary  Patient Details  Name: [name] MRN: [id number] Date of Birth: 1950-04-13  Today's Date: 05/04/2013 Time: 0830-0930 Time Calculation (min): 60 min  Skilled Intervention:  ADL-retraining at shower level with focus on dynamic standing balance, transfers, safety awareness, and adapted bathing/dressing skills.   Patient completed toileting, shower and dressing, sitting and standing with close supervision due impaired safety awareness and increased anxiety relating to planned transfer to SNF.   Patient required redirection and assist with sequencing this session but performed bathing unassisted and dressing with mod assist for lower body due to having not purchased her own AE to date.  Patient stated again this session that she planned to purchase specific AE (reacher, sock aid and shoe horn) prior to transfer to SNF.   After finished with LB dressing at edge of bed, OT presented patient with RW for mobility to her recliner.   While ambulating patient impulsively reached for her robe on bed and attempted to don it while standing away from her walker: she lost her balance but recovered before falling due to contact from OT standing nearby.   Patient remains at risk for falls due to impaired attention amplifying memory deficits and impaired safety awareness when distracted by overstimulation (anxiety or environmental distractions).                                                                                                         Discharge Summary  Patient has met 7 of 8 long term goals due to improved activity tolerance, improved balance and ability to compensate for deficits.  Patient to discharge at [facility] Assist level.  Patient's care partner unavailable to provide the necessary physical assistance at discharge.    Reasons goals not met: Requires use of AE for LLE bathing and dressing which she has not acquired to date, despite repeated  prompts.  Recommendation:  Patient will benefit from ongoing skilled OT services in skilled nursing facility setting to continue to advance functional skills in the area of BADL and iADL.  Equipment: No equipment provided  Reasons for discharge: treatment goals met  Patient/family agrees with progress made and goals achieved: Yes  OT Discharge Precautions/Restrictions  Precautions Precautions: Fall Required Braces or Orthoses: Knee Immobilizer - Left Knee Immobilizer - Left: On at all times Restrictions Weight Bearing Restrictions: Yes LLE Weight Bearing: Weight bearing as tolerated Other Position/Activity Restrictions: NO ROM LEFT KNEE; quad sets and ankle pumps OK  Pain Pain Assessment Pain Score: 6  Pain Type: Surgical pain Pain Location: Knee Pain Orientation: Left Pain Descriptors / Indicators: Aching Pain Onset: With Activity Pain Intervention(s): RN made aware  ADL ADL ADL Comments: see FIM  Vision/Perception  Vision- History Patient Visual Report: No change from baseline Vision- Assessment Vision Assessment?: No apparent visual deficits   Cognition Arousal/Alertness: Awake/alert Orientation Level: Oriented X4 Memory: Impaired Memory Impairment: Decreased recall of new information;Decreased short term memory Comments: memory impairment not noticable on initial evaluation however during stay poor memory became noticable and problematic for return home  and continues to need supervision  Sensation Sensation Light Touch: Appears Intact (Reports WNL however demostrates slightly decreased sensation in feet) Proprioception: Appears Intact Coordination Gross Motor Movements are Fluid and Coordinated: Yes Fine Motor Movements are Fluid and Coordinated: Yes  Motor  Motor Motor: Within Functional Limits  Mobility  Bed Mobility Bed Mobility: Supine to Sit;Sit to Supine Supine to Sit: 6: Modified independent (Device/Increase time);HOB flat (with leg  lifter) Sit to Supine: 6: Modified independent (Device/Increase time);HOB flat (with use of leg lifter) Transfers Sit to Stand: 5: Supervision Stand to Sit: 5: Supervision   Trunk/Postural Assessment  Cervical Assessment Cervical Assessment: Within Functional Limits Thoracic Assessment Thoracic Assessment: Within Functional Limits Lumbar Assessment Lumbar Assessment: Within Functional Limits Postural Control Postural Control:  (Continues with deficits however improved since evaluation) Righting Reactions: impaired   Balance Balance Balance Assessed: Yes Dynamic Standing Balance Dynamic Standing - Balance Support: Bilateral upper extremity supported Dynamic Standing - Level of Assistance: 5: Stand by assistance Dynamic Standing - Balance Activities: Lateral lean/weight shifting;Reaching for objects  Extremity/Trunk Assessment RUE Assessment RUE Assessment: Within Functional Limits LUE Assessment LUE Assessment: Within Functional Limits  See FIM for current functional status  [name] 05/04/2013, 12:42 PM

## 2013-05-04 NOTE — Progress Notes (Addendum)
Physical Therapy Discharge Summary  Patient Details  Name: [name] MRN: [id number] Date of Birth: May 01, 1950  Today's Date: 05/04/2013 Time: 0730-0825 Time Calculation (min): 55 min  Pt tearful upon entry - scared of changing settings. PT provided emotional relief and PT discussed typical sequence of events with transfers and starting therapy at SNF. Pt ambulated to/from bathroom with supervision, needed safety cues for pulling up underpants as she walked away from commode and forgot to do this. Cues also to avoid taking both hands off RW at this time to pull up pants. Car transfer performed with supervision sequencing cues, pt utilized leg lifter. Home environment w/c mobility with mod I (increased time), controlled environment mod I(increased time required). Stairs x 8 with RW (backwards ascending, forwards descending) and min assist to stabilize RW, pt continues to need sequencing for LEs and placement cues for RW. Ambulation x 150' with supervision and RW.   Patient has met 9 of 10 long term goals due to improved activity tolerance, improved balance, increased strength and ability to compensate for deficits.  Patient to discharge at an ambulatory level Supervision.   Patient's care partner requires assistance to provide the necessary physical assistance at discharge.  Reasons goals not met: Pt did not meet mod I goal for stand pivot transfer. Pt remains at supervision level when standing. Pt's family unable to provide this and therefore pt being transferred to SNF until able to reach mod I level.   Recommendation:  Patient will benefit from ongoing skilled PT services in skilled nursing facility setting to continue to advance safe functional mobility, address ongoing impairments in reaching mod I level with transfers, ambulation, impairments with standing balance, safety with mobility, stairs, and minimize fall risk.  Equipment: No equipment provided  Reasons for discharge: discharge  from hospital  Patient/family agrees with progress made and goals achieved: Yes  PT Discharge Precautions/Restrictions Precautions Precautions: Fall Required Braces or Orthoses: Knee Immobilizer - Left Knee Immobilizer - Left: On at all times Restrictions Weight Bearing Restrictions: Yes LLE Weight Bearing: Weight bearing as tolerated Other Position/Activity Restrictions: NO ROM LEFT KNEE; quad sets and ankle pumps OK Vital Signs Therapy Vitals Temp: 98.3 F (36.8 C) Temp src: Oral Pulse Rate: 88 Resp: 18 BP: 169/75 mmHg Patient Position, if appropriate: Lying Oxygen Therapy SpO2: 94 % O2 Device: Nasal cannula O2 Flow Rate (L/min): 2 L/min Pain Pain Assessment Pain Assessment: 0-10 Pain Score: 9  Pain Type: Surgical pain Pain Location: Knee Pain Orientation: Left Pain Descriptors / Indicators: Aching Pain Frequency: Constant Pain Onset: With Activity Pain Intervention(s): RN made aware  Cognition Arousal/Alertness: Awake/alert Orientation Level: Oriented X4 Memory: Impaired Memory Impairment: Decreased recall of new information;Decreased short term memory Comments: memory impairment not noticable on initial evaluation however during stay poor memory became noticable and problematic for return home and continues to need supervision Sensation Sensation Light Touch: Appears Intact (Reports WNL however demostrates slightly decreased sensation in feet) Proprioception: Appears Intact Coordination Gross Motor Movements are Fluid and Coordinated: Yes Fine Motor Movements are Fluid and Coordinated: Yes Motor  Motor Motor: Within Functional Limits  Mobility Bed Mobility Bed Mobility: Supine to Sit;Sit to Supine Supine to Sit: 6: Modified independent (Device/Increase time);HOB flat (with leg lifter) Sit to Supine: 6: Modified independent (Device/Increase time);HOB flat (with use of leg lifter) Transfers Transfers: Yes Sit to Stand: 5: Supervision Stand to Sit: 5:  Supervision Stand Pivot Transfers: 5: Supervision Lateral/Scoot Transfer Details (indicate cue type and reason): Pt continues to require  supervision due to combination of impaired balance (typically posteriorly) and decreased memory which contributes to decreased safety.  Locomotion  Ambulation Ambulation/Gait Assistance: 5: Supervision Ambulation Distance (Feet): 150 Feet Assistive device: Rolling walker Ambulation/Gait Assistance Details: Supervision for safety only Gait Gait: Yes Gait Pattern: Impaired Gait Pattern: Step-to pattern;Shuffle;Trunk flexed (less trunk flexion than evaluation) High Level Ambulation High Level Ambulation: Side stepping;Backwards walking Side Stepping: supervision Backwards Walking: supervision Stairs / Additional Locomotion Stairs: Yes Stairs Assistance: 4: Min assist Stairs Assistance Details (indicate cue type and reason): Cues for sequencing, decreased recall of RW and LE sequencing. Stair Management Technique: Step to pattern;Backwards;With walker Number of Stairs: 8 Ramp: 5: Supervision Wheelchair Mobility Wheelchair Mobility: Yes Wheelchair Assistance: 6: Modified independent (Device/Increase time) Wheelchair Propulsion: Both upper extremities;Right lower extremity Distance: 150'  Trunk/Postural Assessment  Cervical Assessment Cervical Assessment: Within Functional Limits Thoracic Assessment Thoracic Assessment: Within Functional Limits Lumbar Assessment Lumbar Assessment: Within Functional Limits Postural Control Postural Control:  (Continues with deficits however improved since evaluation) Righting Reactions: impaired  Balance Balance Balance Assessed: Yes Dynamic Standing Balance Dynamic Standing - Balance Support: Bilateral upper extremity supported Dynamic Standing - Level of Assistance: 5: Stand by assistance Dynamic Standing - Balance Activities: Lateral lean/weight shifting;Reaching for objects Extremity Assessment      RLE  Assessment RLE Assessment: Exceptions to [facility] (generalized deconditioning grossly >/=4-/5) LLE Assessment LLE Assessment: Exceptions to WFL LLE AROM (degrees) LLE Overall AROM Comments: KI on at all times, good ROM of ankle LLE Strength LLE Overall Strength Comments: unable to MMT due to retrictions however able to maintain body weight well   during ambulation and steps. Pt unable to perform straight leg raise by self. Difficulty obtaining quad set  See FIM for current functional status  Lahoma Rocker 05/04/2013, 8:29 AM

## 2013-05-04 NOTE — Progress Notes (Signed)
Social Work  Discharge Note  The overall goal for the admission was met for:   Discharge location: No - plan changed to SNF as family now feels they cannot meet her assistance needs  Length of Stay: Yes - 8 days  Discharge activity level: Yes - supervision  Home/community participation: Yes  Services provided included: MD, RD, PT, OT, RN, Pharmacy and SW  Financial Services: Medicare and Medicaid  Follow-up services arranged: Other: SNF placement at [place] (or additional information):  Patient/Family verbalized understanding of follow-up arrangements: Yes  Individual responsible for coordination of the follow-up plan: patient  Confirmed correct DME delivered: NA    [name]

## 2013-05-04 NOTE — Progress Notes (Signed)
Pt was discharged with family member. Discharge instruction was fiven by Algis Liming , [place]

## 2013-05-04 NOTE — Progress Notes (Addendum)
[facility] PHYSICAL MEDICINE & REHABILITATION     PROGRESS NOTE    Subjective/Complaints: Slept better last night with fentanyl patch (although she woke up in increased pain)   A 12 point review of systems has been performed and if not noted above is otherwise negative.   Objective: Vital Signs: Blood pressure 169/75, pulse 88, temperature 98.3 F (36.8 C), temperature source Oral, resp. rate 18, weight 86.2 kg (190 lb 0.6 oz), SpO2 94.00%. No results found. No results found for this basename: WBC, HGB, HCT, PLT,  in the last 72 hours No results found for this basename: NA, K, CL, CO, GLUCOSE, BUN, CREATININE, CALCIUM,  in the last 72 hours CBG (last 3)   Recent Labs  05/03/13 1129 05/03/13 1722 05/03/13 2057  GLUCAP 207* 223* 153*    Wt Readings from Last 3 Encounters:  04/28/13 86.2 kg (190 lb 0.6 oz)  04/22/13 81.9 kg (180 lb 8.9 oz)  04/22/13 81.9 kg (180 lb 8.9 oz)    Physical Exam:  Constitutional: She is oriented to person, place, and time.  HENT: dentition poor, oral mucosa pink and moist  Head: Normocephalic.  Eyes: EOM are normal.  Neck: Normal range of motion. Neck supple. No thyromegaly present.  Cardiovascular: ?bigeminy rhythm Respiratory: Effort normal and breath sounds normal. No respiratory distress. No wheezes GI: Soft. Bowel sounds are normal. She exhibits no distension.  Colostomy tube site clean and intact Neurological: She is alert and oriented to person, place, and time.   motor strength 5/5 in bilateral deltoid, bicep, tricep, grip,  4/5 in the right hip flexor, ksssssssnot tested secondary to pain left ankle dorsiflexor plantar flexor 3 minus secondary to pain  Knee immobilizer fitting appropriately Extremity no evidence of calf edema on the right side   Hypersensitive to touch. Minimal edema.  Psych: a little anxious, pleasant Skin:  Incision Left knee CDI, small ecchymosis mid patellar  Assessment/Plan: 1. Functional deficits  secondary to left patellar fx which require 3+ hours per day of interdisciplinary therapy in a comprehensive inpatient rehab setting. Physiatrist is providing close team supervision and 24 hour management of active medical problems listed below. Physiatrist and rehab team continue to assess barriers to discharge/monitor patient progress toward functional and medical goals. FIM: FIM - Bathing Bathing Steps Patient Completed: Chest;Right Arm;Left Arm;Abdomen;Front perineal area;Buttocks;Right upper leg;Right lower leg (including foot) Bathing: 4: Min-Patient completes 8-9 51f 10 parts or 75+ percent  FIM - Upper Body Dressing/Undressing Upper body dressing/undressing steps patient completed: Thread/unthread right bra strap;Thread/unthread left bra strap;Thread/unthread right sleeve of pullover shirt/dresss;Thread/unthread left sleeve of pullover shirt/dress;Put head through opening of pull over shirt/dress;Pull shirt over trunk Upper body dressing/undressing: 4: Min-Patient completed 75 plus % of tasks FIM - Lower Body Dressing/Undressing Lower body dressing/undressing steps patient completed: Thread/unthread left underwear leg;Pull underwear up/down;Thread/unthread left pants leg;Pull pants up/down;Don/Doff right sock Lower body dressing/undressing: 3: Mod-Patient completed 50-74% of tasks  FIM - Toileting Toileting steps completed by patient: Adjust clothing prior to toileting;Performs perineal hygiene;Adjust clothing after toileting Toileting Assistive Devices: Grab bar or rail for support Toileting: 5: Supervision: Safety issues/verbal cues  FIM - Radio producer Devices: Mining engineer Transfers: 5-To toilet/BSC: Supervision (verbal cues/safety issues)  FIM - Control and instrumentation engineer Devices: Environmental consultant;Arm rests;Orthosis (L knee immobilizer) Bed/Chair Transfer: 5: Chair or W/C > Bed: Supervision (verbal cues/safety issues);5: Bed >  Chair or W/C: Supervision (verbal cues/safety issues)  FIM - Locomotion: Wheelchair Distance: 120 Locomotion: Wheelchair: 1: Total  Assistance/staff pushes wheelchair (Pt<25%) FIM - Locomotion: Ambulation Locomotion: Ambulation Assistive Devices: Walker - Rolling Ambulation/Gait Assistance: 5: Supervision Locomotion: Ambulation: 5: Travels 150 ft or more with supervision/safety issues  Comprehension Comprehension Mode: Auditory Comprehension: 6-Follows complex conversation/direction: With extra time/assistive device  Expression Expression Mode: Verbal Expression: 6-Expresses complex ideas: With extra time/assistive device  Social Interaction Social Interaction: 5-Interacts appropriately 90% of the time - Needs monitoring or encouragement for participation or interaction.  Problem Solving Problem Solving: 6-Solves complex problems: With extra time  Memory Memory: 5-Recognizes or recalls 90% of the time/requires cueing < 10% of the time  Medical Problem List and Plan:  Displaced Left patellar fracture secondary to fall 04/21/2013  1. DVT Prophylaxis/Anticoagulation: Pharmaceutical: Lovenox .  2. Chronic abdominal pain/Pain Management: Continue oxycodone and fentanyl for chronic pain. Added neurontin for dysesthesias left knee.   -continue fentanyl patch 33mcg    3. Anxiety disorder/ Mood: Provide ego support for high levels of anxiety. This will hopefully improve with time, ego support and ongoing therapy. LCSW to follow for evaluation and support.  4. Neuropsych: This patient is capable of making decisions on her own behalf.  5. NSCL cancer/ COPD: Oxygen with activity and at nights. Continue Dulera. Exam stable today--some anxiety 6. DM type 2: Will monitor with bid checks. Continue lantus 35 units bid with SSI for elevated BS.   -increased to 40u bid yesterday 7. 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 GI symptoms stable but with poor intake. Will add supplements with meals.   8. Chronic Leucocytosis: Trend  down 9. Anemia of chronic disease: stable. hgb 10.9    LOS (Days) 8 A FACE TO FACE EVALUATION WAS PERFORMED  [name] T 05/04/2013 7:43 AM

## 2013-05-18 ENCOUNTER — Inpatient Hospital Stay (HOSPITAL_COMMUNITY)
Admission: EM | Admit: 2013-05-18 | Discharge: 2013-05-20 | DRG: 378 | Disposition: A | Discharge status: Skilled Nursing Facility | Payer: Medicare Other | Attending: Internal Medicine | Admitting: Internal Medicine

## 2013-05-18 ENCOUNTER — Encounter (HOSPITAL_COMMUNITY): Payer: Self-pay | Admitting: Emergency Medicine

## 2013-05-18 DIAGNOSIS — Z4789 Encounter for other orthopedic aftercare: Secondary | ICD-10-CM

## 2013-05-18 DIAGNOSIS — E1149 Type 2 diabetes mellitus with other diabetic neurological complication: Secondary | ICD-10-CM | POA: Diagnosis present

## 2013-05-18 DIAGNOSIS — C3490 Malignant neoplasm of unspecified part of unspecified bronchus or lung: Secondary | ICD-10-CM | POA: Diagnosis present

## 2013-05-18 DIAGNOSIS — D649 Anemia, unspecified: Secondary | ICD-10-CM

## 2013-05-18 DIAGNOSIS — E860 Dehydration: Secondary | ICD-10-CM | POA: Diagnosis present

## 2013-05-18 DIAGNOSIS — J449 Chronic obstructive pulmonary disease, unspecified: Secondary | ICD-10-CM | POA: Diagnosis present

## 2013-05-18 DIAGNOSIS — Z79899 Other long term (current) drug therapy: Secondary | ICD-10-CM

## 2013-05-18 DIAGNOSIS — E875 Hyperkalemia: Secondary | ICD-10-CM | POA: Diagnosis present

## 2013-05-18 DIAGNOSIS — J4489 Other specified chronic obstructive pulmonary disease: Secondary | ICD-10-CM | POA: Diagnosis present

## 2013-05-18 DIAGNOSIS — Z9089 Acquired absence of other organs: Secondary | ICD-10-CM

## 2013-05-18 DIAGNOSIS — E785 Hyperlipidemia, unspecified: Secondary | ICD-10-CM | POA: Diagnosis present

## 2013-05-18 DIAGNOSIS — Z933 Colostomy status: Secondary | ICD-10-CM

## 2013-05-18 DIAGNOSIS — Z888 Allergy status to other drugs, medicaments and biological substances status: Secondary | ICD-10-CM

## 2013-05-18 DIAGNOSIS — E1140 Type 2 diabetes mellitus with diabetic neuropathy, unspecified: Secondary | ICD-10-CM | POA: Diagnosis present

## 2013-05-18 DIAGNOSIS — R197 Diarrhea, unspecified: Secondary | ICD-10-CM | POA: Diagnosis present

## 2013-05-18 DIAGNOSIS — Z8249 Family history of ischemic heart disease and other diseases of the circulatory system: Secondary | ICD-10-CM

## 2013-05-18 DIAGNOSIS — Z833 Family history of diabetes mellitus: Secondary | ICD-10-CM

## 2013-05-18 DIAGNOSIS — D696 Thrombocytopenia, unspecified: Secondary | ICD-10-CM | POA: Diagnosis present

## 2013-05-18 DIAGNOSIS — IMO0002 Reserved for concepts with insufficient information to code with codable children: Secondary | ICD-10-CM | POA: Diagnosis present

## 2013-05-18 DIAGNOSIS — J9610 Chronic respiratory failure, unspecified whether with hypoxia or hypercapnia: Secondary | ICD-10-CM | POA: Diagnosis present

## 2013-05-18 DIAGNOSIS — Z902 Acquired absence of lung [part of]: Secondary | ICD-10-CM

## 2013-05-18 DIAGNOSIS — J439 Emphysema, unspecified: Secondary | ICD-10-CM | POA: Diagnosis present

## 2013-05-18 DIAGNOSIS — K219 Gastro-esophageal reflux disease without esophagitis: Secondary | ICD-10-CM | POA: Diagnosis present

## 2013-05-18 DIAGNOSIS — Z880 Allergy status to penicillin: Secondary | ICD-10-CM

## 2013-05-18 DIAGNOSIS — D500 Iron deficiency anemia secondary to blood loss (chronic): Secondary | ICD-10-CM | POA: Diagnosis present

## 2013-05-18 DIAGNOSIS — F411 Generalized anxiety disorder: Secondary | ICD-10-CM | POA: Diagnosis present

## 2013-05-18 DIAGNOSIS — Z794 Long term (current) use of insulin: Secondary | ICD-10-CM

## 2013-05-18 DIAGNOSIS — F329 Major depressive disorder, single episode, unspecified: Secondary | ICD-10-CM

## 2013-05-18 DIAGNOSIS — D62 Acute posthemorrhagic anemia: Secondary | ICD-10-CM | POA: Diagnosis present

## 2013-05-18 DIAGNOSIS — K922 Gastrointestinal hemorrhage, unspecified: Principal | ICD-10-CM | POA: Diagnosis present

## 2013-05-18 DIAGNOSIS — R161 Splenomegaly, not elsewhere classified: Secondary | ICD-10-CM | POA: Diagnosis present

## 2013-05-18 DIAGNOSIS — I10 Essential (primary) hypertension: Secondary | ICD-10-CM | POA: Diagnosis present

## 2013-05-18 DIAGNOSIS — Z87891 Personal history of nicotine dependence: Secondary | ICD-10-CM

## 2013-05-18 DIAGNOSIS — E1165 Type 2 diabetes mellitus with hyperglycemia: Secondary | ICD-10-CM

## 2013-05-18 DIAGNOSIS — K7460 Unspecified cirrhosis of liver: Secondary | ICD-10-CM | POA: Diagnosis present

## 2013-05-18 DIAGNOSIS — F3289 Other specified depressive episodes: Secondary | ICD-10-CM | POA: Diagnosis present

## 2013-05-18 LAB — CBC
HCT: 29.9 % — ABNORMAL LOW (ref 36.0–46.0)
HEMOGLOBIN: 9.8 g/dL — AB (ref 12.0–15.0)
MCH: 26.7 pg (ref 26.0–34.0)
MCHC: 32.8 g/dL (ref 30.0–36.0)
MCV: 81.5 fL (ref 78.0–100.0)
Platelets: 780 10*3/uL — ABNORMAL HIGH (ref 150–400)
RBC: 3.67 MIL/uL — ABNORMAL LOW (ref 3.87–5.11)
RDW: 15.7 % — ABNORMAL HIGH (ref 11.5–15.5)
WBC: 13 10*3/uL — AB (ref 4.0–10.5)

## 2013-05-18 LAB — COMPREHENSIVE METABOLIC PANEL
ALK PHOS: 193 U/L — AB (ref 39–117)
ALT: 31 U/L (ref 0–35)
AST: 30 U/L (ref 0–37)
Albumin: 2.5 g/dL — ABNORMAL LOW (ref 3.5–5.2)
BILIRUBIN TOTAL: 0.3 mg/dL (ref 0.3–1.2)
BUN: 16 mg/dL (ref 6–23)
CALCIUM: 9.6 mg/dL (ref 8.4–10.5)
CHLORIDE: 91 meq/L — AB (ref 96–112)
CO2: 21 mEq/L (ref 19–32)
Creatinine, Ser: 0.78 mg/dL (ref 0.50–1.10)
GFR calc Af Amer: >90 mL/min (ref >90)
GFR, EST NON AFRICAN AMERICAN: 88 mL/min — AB (ref >90)
GLUCOSE: 196 mg/dL — AB (ref 70–99)
Potassium: 5.2 mEq/L (ref 3.7–5.3)
SODIUM: 125 meq/L — AB (ref 137–147)
Total Protein: 6.9 g/dL (ref 6.0–8.3)

## 2013-05-18 MED ORDER — HYDROMORPHONE HCL PF 1 MG/ML IJ SOLN
1.0000 mg | Freq: Once | INTRAMUSCULAR | Status: AC
  Administered 2013-05-18: 1 mg via INTRAVENOUS
  Filled 2013-05-18: qty 1

## 2013-05-18 MED ORDER — ONDANSETRON HCL 4 MG/2ML IJ SOLN
4.0000 mg | Freq: Once | INTRAMUSCULAR | Status: AC
  Administered 2013-05-19: 4 mg via INTRAVENOUS
  Filled 2013-05-18: qty 2

## 2013-05-18 MED ORDER — SODIUM CHLORIDE 0.9 % IV SOLN
INTRAVENOUS | Status: DC
  Administered 2013-05-19: via INTRAVENOUS

## 2013-05-18 NOTE — ED Notes (Signed)
Bed: XT05 Expected date:  Expected time:  Means of arrival:  Comments: Bleeding in colostomy

## 2013-05-18 NOTE — ED Provider Notes (Signed)
CSN: [id number]     Arrival date & time 05/18/13  2252 History   First MD Initiated Contact with Patient 05/18/13 2325     Chief Complaint  Patient presents with  . GI Bleeding     (Consider location/radiation/quality/duration/timing/severity/associated sxs/prior Treatment) HPI History provided by patient. Past medical history of cirrhosis, IBS, diverticulitis/ perforation/ colostomy.  She has a history of GI bleeding with significant workup including colonoscopy, endoscopy, followed locally as well as at [facility] for this.  She reports no known bleeding source ever found. She denies any significant bleeding into her ostomy bag in the last few months.  A few weeks ago had fractured her left patella, required surgery and since that time has been in a skilled nursing facility. Tonight at nursing facility developed some maroon stool with blood clots in her ostomy bag. No new abdominal pain. Patient states she has chronic epigastric discomfort that is mild. Currently has a small amount of blood in her ostomy bag - she feels like it is getting better. She denies feeling like she needed to come to the emergency room but nursing facility called EMS. No weakness, dizziness or syncope. No back pain. No nausea vomiting. She gets Percocet for knee pain status post surgery and is requesting Dilaudid at this time.   Past Medical History  Diagnosis Date  . Cirrhosis   . GERD (gastroesophageal reflux disease)   . Cervical disc syndrome   . Lung cancer     squamous cell  . Chronic respiratory failure   . Diverticulitis of colon   . Perforation of colon   . Arthritis   . IBS (irritable bowel syndrome)   . Chronic lower GI bleeding   . Overactive bladder   . Cervical cancer   . Thrombocytopenia     sees Dr. Julien Nordmann   . Splenomegaly   . Anxiety   . Depression   . Hypertension   . Asthma   . Chronic headache disorder   . Blood transfusion without reported diagnosis   . Heart murmur   . Hyperlipidemia    . Lung cancer   . COPD (chronic obstructive pulmonary disease)     sees Dr. Gwenette Greet   . Anemia     sees Dr. Julien Nordmann, due to chronic disease and GI losses   . Diabetes mellitus     sees Dr. Cruzita Lederer   . Colostomy care   . Hypercalcemia    Past Surgical History  Procedure Laterality Date  . Appendectomy    . Cholecystectomy    . Colostomy    . Pneumonectomy    . Cervix removed    . Bowel resection    . Esophagogastroduodenoscopy  03/19/2011    Procedure: ESOPHAGOGASTRODUODENOSCOPY (EGD);  Surgeon: [name], MD;  Location: [facility];  Service: Endoscopy;  Laterality: N/A;  . Colonoscopy  03/19/2011    Procedure: COLONOSCOPY;  Surgeon: [name], MD;  Location: [facility];  Service: Endoscopy;  Laterality: N/A;  . Givens capsule study  03/20/2011    Procedure: GIVENS CAPSULE STUDY;  Surgeon: [name], MD;  Location: [facility];  Service: Endoscopy;  Laterality: N/A;  . Small bowel obstruction repair  March 2012  . Umbilical hernia repair  March 2012  . Splenectomy, total N/A 02/24/2013    Procedure: SPLENECTOMY;  Surgeon: Harl Bowie, MD;  Location: [facility];  Service: General;  Laterality: N/A;  . Orif patella fracture Left 04/21/2013    DR [name]  . Orif patella  Left 04/21/2013    Procedure: OPEN REDUCTION INTERNAL (ORIF) FIXATION PATELLA;  Surgeon: Augustin Schooling, MD;  Location: [facility];  Service: Orthopedics;  Laterality: Left;   Family History  Problem Relation Age of Onset  . Coronary artery disease    . Diabetes type II    . Anesthesia problems Neg Hx   . Hypotension Neg Hx   . Malignant hyperthermia Neg Hx   . Pseudochol deficiency Neg Hx   . Heart attack Mother   . Diabetes type II Mother   . Cirrhosis Father    History  Substance Use Topics  . Smoking status: Former Smoker -- 2.00 packs/day for 35 years    Types: Cigarettes    Quit date: 03/24/2002  . Smokeless tobacco: Never Used  . Alcohol Use: No   OB History   Grav Para Term  Preterm Abortions TAB SAB Ect Mult Living                 Review of Systems  Constitutional: Negative for fever and chills.  Respiratory: Negative for shortness of breath.   Cardiovascular: Negative for chest pain.  Gastrointestinal: Positive for blood in stool. Negative for nausea, vomiting and diarrhea.  Musculoskeletal: Negative for back pain and neck pain.  Skin: Negative for rash.  Neurological: Negative for weakness and numbness.  All other systems reviewed and are negative.     Allergies  Penicillins; Codeine phosphate; Hydrocodone-acetaminophen; Morphine; Other; and Cephalexin  Home Medications   Prior to Admission medications   Medication Sig Start Date End Date Taking? Authorizing Provider  albuterol-ipratropium (COMBIVENT) 18-103 MCG/ACT inhaler Inhale 2 puffs into the lungs every 4 (four) hours as needed for wheezing or shortness of breath.    Yes Historical Provider, MD  aspirin 325 MG tablet Take 1 tablet (325 mg total) by mouth daily. 03/08/13  Yes Harl Bowie, MD  clindamycin (CLEOCIN) 300 MG capsule Take 300 mg by mouth 3 (three) times daily. 05/13/13 05/20/13 Yes Historical Provider, MD  diazepam (VALIUM) 5 MG tablet Take 1 tablet (5 mg total) by mouth 3 (three) times daily as needed for anxiety. 05/03/13  Yes Ivan Anchors Love, PA-C  dicyclomine (BENTYL) 10 MG capsule Take 10 mg by mouth 4 (four) times daily -  before meals and at bedtime.   Yes Historical Provider, MD  esomeprazole (NEXIUM) 40 MG capsule Take 40 mg by mouth every evening.   Yes Historical Provider, MD  fentaNYL (DURAGESIC - DOSED MCG/HR) 25 MCG/HR patch Place 1 patch (25 mcg total) onto the skin every 3 (three) days. 05/03/13  Yes Ivan Anchors Love, PA-C  Fluticasone-Salmeterol (ADVAIR) 250-50 MCG/DOSE AEPB Inhale 1 puff into the lungs every 12 (twelve) hours. 04/12/13  Yes [name], MD  folic acid (FOLVITE) 1 MG tablet Take 1 tablet (1 mg total) by mouth every evening. 04/12/13  Yes [name], MD   gabapentin (NEURONTIN) 100 MG capsule Take 1 capsule (100 mg total) by mouth at bedtime. 05/03/13  Yes Ivan Anchors Love, PA-C  insulin glargine (LANTUS) 100 UNIT/ML injection Inject 0.4 mLs (40 Units total) into the skin 2 (two) times daily. 05/04/13  Yes Ivan Anchors Love, PA-C  methocarbamol (ROBAXIN) 500 MG tablet Take 1 tablet (500 mg total) by mouth every 6 (six) hours as needed for muscle spasms. 05/03/13  Yes Ivan Anchors Love, PA-C  metoCLOPramide (REGLAN) 10 MG tablet Take 1 tablet (10 mg total) by mouth 3 (three) times daily. 04/12/13  Yes Ishmael Holter  Sarajane Jews, MD  ondansetron (ZOFRAN) 4 MG tablet Take 1 tablet (4 mg total) by mouth every 6 (six) hours as needed for nausea. 05/03/13  Yes Ivan Anchors Love, PA-C  Oxycodone HCl 20 MG TABS Take 1 tablet (20 mg total) by mouth every 3 (three) hours as needed (pain). 05/03/13  Yes Ivan Anchors Love, PA-C  polyethylene glycol (MIRALAX / GLYCOLAX) packet Take 17 g by mouth daily as needed for mild constipation. 05/03/13  Yes Ivan Anchors Love, PA-C  rifaximin (XIFAXAN) 550 MG TABS tablet Take 550 mg by mouth 2 (two) times daily. 06/29/12  Yes [name], MD  traMADol (ULTRAM) 50 MG tablet Take 1 tablet (50 mg total) by mouth every 12 (twelve) hours as needed for moderate pain. 05/03/13  Yes Ivan Anchors Love, PA-C  traZODone (DESYREL) 100 MG tablet Take 2 tablets (200 mg total) by mouth at bedtime. 05/03/13  Yes Ivan Anchors Love, PA-C  Insulin Pen Needle (BD PEN NEEDLE NANO U/F) 32G X 4 MM MISC Use 4 times daily as directed. 12/30/12   Philemon Kingdom, MD   BP 167/46  Pulse 82  Temp(Src) 99 F (37.2 C) (Oral)  Resp 18  SpO2 95% Physical Exam  Constitutional: She is oriented to person, place, and time. She appears well-developed and well-nourished.  HENT:  Head: Normocephalic and atraumatic.  Eyes: EOM are normal. Pupils are equal, round, and reactive to light.  Neck: Neck supple.  Cardiovascular: Normal rate, regular rhythm and intact distal pulses.   Pulmonary/Chest: Effort normal and  breath sounds normal. No respiratory distress. She exhibits no tenderness.  Abdominal: Soft. Bowel sounds are normal. She exhibits no distension. There is no rebound and no guarding.  Small amount of maroon stool in ostomy bag. No clots  Musculoskeletal:  Left lower extremity with knee sleeve in place. Distal pulses, motor and sensorium intact  Neurological: She is alert and oriented to person, place, and time.  Skin: Skin is warm and dry.    ED Course  Procedures (including critical care time) Labs Review Labs Reviewed  CBC - Abnormal; Notable for the following:    WBC 13.0 (*)    RBC 3.67 (*)    Hemoglobin 9.8 (*)    HCT 29.9 (*)    RDW 15.7 (*)    Platelets 780 (*)    All other components within normal limits  COMPREHENSIVE METABOLIC PANEL - Abnormal; Notable for the following:    Sodium 125 (*)    Chloride 91 (*)    Glucose, Bld 196 (*)    Albumin 2.5 (*)    Alkaline Phosphatase 193 (*)    GFR calc non Af Amer 88 (*)    All other components within normal limits    Imaging Review Dg Abd Acute W/chest  05/19/2013   CLINICAL DATA:  GI bleed with bloody output and colostomy bag.  EXAM: ACUTE ABDOMEN SERIES (ABDOMEN 2 VIEW & CHEST 1 VIEW)  COMPARISON:  DG PELVIS 1-2 VIEWS dated 04/21/2013; DG CHEST 2 VIEW dated 04/21/2013  FINDINGS: Shallow inspiration with elevation of left hemidiaphragm. Heart size and pulmonary vascularity are normal for technique. Interstitial fibrosis in the lungs. Postoperative changes in the mediastinum. No change since prior chest.  Surgical clips in the right upper quadrant. Stool-filled colon. No small or large bowel distention. No radiopaque stones identified. Degenerative changes in the spine.  IMPRESSION: Negative abdominal radiographs.  No acute cardiopulmonary disease.   Electronically Signed   By: Oren Beckmann.D.  On: 05/19/2013 00:29   T/S IV Dilaudid for knee pain  2:15 AM d/w DR Hal Hope - plan admit  MDM   Dx: GIB  Maroon stool on  ostomy bag h/o same, has required transfusions in the past. Hemoglobin is 9.8. VS reviewed no tachycardia or hypotension. Plan MED admit     Teressa Lower, MD 05/19/13 [phone]

## 2013-05-18 NOTE — ED Notes (Signed)
Brought in by EMS from [facility] with c/o "bloody output" from colostomy.  Pt reports that she has observed blood in her colostomy bag, onset sudden.  Pt reports that she has had this same kind of episode back in January but "they did not find anything".

## 2013-05-19 ENCOUNTER — Emergency Department (HOSPITAL_COMMUNITY): Payer: Medicare Other

## 2013-05-19 ENCOUNTER — Encounter (HOSPITAL_COMMUNITY): Payer: Self-pay | Admitting: Internal Medicine

## 2013-05-19 DIAGNOSIS — R197 Diarrhea, unspecified: Secondary | ICD-10-CM

## 2013-05-19 DIAGNOSIS — J449 Chronic obstructive pulmonary disease, unspecified: Secondary | ICD-10-CM

## 2013-05-19 DIAGNOSIS — F329 Major depressive disorder, single episode, unspecified: Secondary | ICD-10-CM

## 2013-05-19 DIAGNOSIS — F3289 Other specified depressive episodes: Secondary | ICD-10-CM

## 2013-05-19 DIAGNOSIS — K7460 Unspecified cirrhosis of liver: Secondary | ICD-10-CM

## 2013-05-19 DIAGNOSIS — D649 Anemia, unspecified: Secondary | ICD-10-CM

## 2013-05-19 DIAGNOSIS — D500 Iron deficiency anemia secondary to blood loss (chronic): Secondary | ICD-10-CM

## 2013-05-19 DIAGNOSIS — E119 Type 2 diabetes mellitus without complications: Secondary | ICD-10-CM

## 2013-05-19 DIAGNOSIS — E1149 Type 2 diabetes mellitus with other diabetic neurological complication: Secondary | ICD-10-CM

## 2013-05-19 DIAGNOSIS — K922 Gastrointestinal hemorrhage, unspecified: Principal | ICD-10-CM

## 2013-05-19 LAB — CBC
HCT: 29.5 % — ABNORMAL LOW (ref 36.0–46.0)
HEMATOCRIT: 30.5 % — AB (ref 36.0–46.0)
HEMATOCRIT: 30.8 % — AB (ref 36.0–46.0)
HEMOGLOBIN: 9.6 g/dL — AB (ref 12.0–15.0)
HEMOGLOBIN: 9.9 g/dL — AB (ref 12.0–15.0)
Hemoglobin: 9.6 g/dL — ABNORMAL LOW (ref 12.0–15.0)
MCH: 26 pg (ref 26.0–34.0)
MCH: 26.5 pg (ref 26.0–34.0)
MCH: 26.4 pg (ref 26.0–34.0)
MCHC: 31.5 g/dL (ref 30.0–36.0)
MCHC: 32.1 g/dL (ref 30.0–36.0)
MCHC: 32.5 g/dL (ref 30.0–36.0)
MCV: 82.7 fL (ref 78.0–100.0)
MCV: 82.4 fL (ref 78.0–100.0)
MCV: 81 fL (ref 78.0–100.0)
PLATELETS: 739 10*3/uL — AB (ref 150–400)
Platelets: 819 10*3/uL — ABNORMAL HIGH (ref 150–400)
Platelets: 867 10*3/uL — ABNORMAL HIGH (ref 150–400)
RBC: 3.69 MIL/uL — AB (ref 3.87–5.11)
RBC: 3.74 MIL/uL — ABNORMAL LOW (ref 3.87–5.11)
RBC: 3.64 MIL/uL — ABNORMAL LOW (ref 3.87–5.11)
RDW: 15.9 % — ABNORMAL HIGH (ref 11.5–15.5)
RDW: 15.8 % — ABNORMAL HIGH (ref 11.5–15.5)
RDW: 15.9 % — AB (ref 11.5–15.5)
WBC: 13.3 10*3/uL — AB (ref 4.0–10.5)
WBC: 14.4 10*3/uL — ABNORMAL HIGH (ref 4.0–10.5)
WBC: 13.3 10*3/uL — ABNORMAL HIGH (ref 4.0–10.5)

## 2013-05-19 LAB — URINALYSIS, ROUTINE W REFLEX MICROSCOPIC
BILIRUBIN URINE: NEGATIVE
Glucose, UA: NEGATIVE mg/dL
Hgb urine dipstick: NEGATIVE
KETONES UR: NEGATIVE mg/dL
LEUKOCYTES UA: NEGATIVE
NITRITE: NEGATIVE
Protein, ur: 100 mg/dL — AB
Specific Gravity, Urine: 1.007 (ref 1.005–1.030)
UROBILINOGEN UA: 0.2 mg/dL (ref 0.0–1.0)
pH: 5.5 (ref 5.0–8.0)

## 2013-05-19 LAB — COMPREHENSIVE METABOLIC PANEL
ALK PHOS: 178 U/L — AB (ref 39–117)
ALT: 29 U/L (ref 0–35)
AST: 27 U/L (ref 0–37)
Albumin: 2.4 g/dL — ABNORMAL LOW (ref 3.5–5.2)
BUN: 16 mg/dL (ref 6–23)
CO2: 21 mEq/L (ref 19–32)
CREATININE: 0.81 mg/dL (ref 0.50–1.10)
Calcium: 9.4 mg/dL (ref 8.4–10.5)
Chloride: 93 mEq/L — ABNORMAL LOW (ref 96–112)
GFR calc non Af Amer: 77 mL/min — ABNORMAL LOW (ref >90)
GFR, EST AFRICAN AMERICAN: 89 mL/min — AB (ref >90)
Glucose, Bld: 151 mg/dL — ABNORMAL HIGH (ref 70–99)
POTASSIUM: 5.1 meq/L (ref 3.7–5.3)
Sodium: 127 mEq/L — ABNORMAL LOW (ref 137–147)
TOTAL PROTEIN: 6.7 g/dL (ref 6.0–8.3)
Total Bilirubin: 0.2 mg/dL — ABNORMAL LOW (ref 0.3–1.2)

## 2013-05-19 LAB — TYPE AND SCREEN
ABO/RH(D): A POS
Antibody Screen: NEGATIVE

## 2013-05-19 LAB — PROTIME-INR
INR: 1.05 (ref 0.00–1.49)
Prothrombin Time: 13.5 seconds (ref 11.6–15.2)

## 2013-05-19 LAB — GLUCOSE, CAPILLARY
GLUCOSE-CAPILLARY: 134 mg/dL — AB (ref 70–99)
GLUCOSE-CAPILLARY: 123 mg/dL — AB (ref 70–99)
GLUCOSE-CAPILLARY: 130 mg/dL — AB (ref 70–99)
GLUCOSE-CAPILLARY: 193 mg/dL — AB (ref 70–99)
Glucose-Capillary: 144 mg/dL — ABNORMAL HIGH (ref 70–99)

## 2013-05-19 LAB — URINE MICROSCOPIC-ADD ON

## 2013-05-19 LAB — MRSA PCR SCREENING: MRSA by PCR: NEGATIVE

## 2013-05-19 MED ORDER — ONDANSETRON HCL 4 MG/2ML IJ SOLN
4.0000 mg | Freq: Four times a day (QID) | INTRAMUSCULAR | Status: DC | PRN
  Administered 2013-05-20: 4 mg via INTRAVENOUS
  Filled 2013-05-19: qty 2

## 2013-05-19 MED ORDER — INSULIN ASPART 100 UNIT/ML ~~LOC~~ SOLN
0.0000 [IU] | Freq: Three times a day (TID) | SUBCUTANEOUS | Status: DC
  Administered 2013-05-19 (×3): 1 [IU] via SUBCUTANEOUS
  Administered 2013-05-20: 2 [IU] via SUBCUTANEOUS
  Administered 2013-05-20: 1 [IU] via SUBCUTANEOUS

## 2013-05-19 MED ORDER — ACETAMINOPHEN 325 MG PO TABS: 650.0000 mg | ORAL_TABLET | Freq: Four times a day (QID) | ORAL | Status: DC | PRN

## 2013-05-19 MED ORDER — ONDANSETRON HCL 4 MG PO TABS: 4.0000 mg | ORAL_TABLET | Freq: Four times a day (QID) | ORAL | Status: DC | PRN

## 2013-05-19 MED ORDER — ACETAMINOPHEN 650 MG RE SUPP: 650.0000 mg | Freq: Four times a day (QID) | RECTAL | Status: DC | PRN

## 2013-05-19 MED ORDER — IPRATROPIUM-ALBUTEROL 0.5-2.5 (3) MG/3ML IN SOLN: 3.0000 mL | RESPIRATORY_TRACT | Status: DC | PRN

## 2013-05-19 MED ORDER — MOMETASONE FURO-FORMOTEROL FUM 100-5 MCG/ACT IN AERO
2.0000 | INHALATION_SPRAY | Freq: Two times a day (BID) | RESPIRATORY_TRACT | Status: DC
  Administered 2013-05-19 – 2013-05-20 (×3): 2 via RESPIRATORY_TRACT
  Filled 2013-05-19: qty 8.8

## 2013-05-19 MED ORDER — CIPROFLOXACIN IN D5W 400 MG/200ML IV SOLN
400.0000 mg | Freq: Two times a day (BID) | INTRAVENOUS | Status: DC
Start: 2013-05-19 — End: 2013-05-20
  Administered 2013-05-19 – 2013-05-20 (×3): 400 mg via INTRAVENOUS
  Filled 2013-05-19 (×4): qty 200

## 2013-05-19 MED ORDER — SODIUM CHLORIDE 0.9 % IV SOLN
INTRAVENOUS | Status: DC
  Administered 2013-05-19 (×2): via INTRAVENOUS

## 2013-05-19 MED ORDER — PANTOPRAZOLE SODIUM 40 MG IV SOLR: 40.0000 mg | Freq: Two times a day (BID) | INTRAVENOUS | Status: DC

## 2013-05-19 MED ORDER — FENTANYL 25 MCG/HR TD PT72
25.0000 ug | MEDICATED_PATCH | TRANSDERMAL | Status: DC
  Administered 2013-05-19: 25 ug via TRANSDERMAL
  Filled 2013-05-19: qty 1

## 2013-05-19 MED ORDER — INSULIN GLARGINE 100 UNIT/ML ~~LOC~~ SOLN
10.0000 [IU] | Freq: Every day | SUBCUTANEOUS | Status: DC
Start: 2013-05-19 — End: 2013-05-20
  Administered 2013-05-19: 10 [IU] via SUBCUTANEOUS
  Filled 2013-05-19 (×3): qty 0.1

## 2013-05-19 MED ORDER — SODIUM CHLORIDE 0.9 % IV SOLN
8.0000 mg/h | INTRAVENOUS | Status: DC
  Administered 2013-05-19 (×2): 8 mg/h via INTRAVENOUS
  Filled 2013-05-19 (×5): qty 80

## 2013-05-19 MED ORDER — SODIUM CHLORIDE 0.9 % IV SOLN
80.0000 mg | Freq: Once | INTRAVENOUS | Status: AC
  Administered 2013-05-19: 80 mg via INTRAVENOUS
  Filled 2013-05-19 (×2): qty 80

## 2013-05-19 MED ORDER — HYDROMORPHONE HCL PF 1 MG/ML IJ SOLN
1.0000 mg | INTRAMUSCULAR | Status: DC | PRN
  Administered 2013-05-19 – 2013-05-20 (×12): 1 mg via INTRAVENOUS
  Filled 2013-05-19 (×12): qty 1

## 2013-05-19 MED ORDER — IPRATROPIUM-ALBUTEROL 18-103 MCG/ACT IN AERO: 2.0000 | INHALATION_SPRAY | RESPIRATORY_TRACT | Status: DC | PRN

## 2013-05-19 NOTE — Consult Note (Signed)
Referring Provider: No ref. provider found Primary Care Physician:  [name], MD Primary Gastroenterologist:  Dr. [name]  Reason for Consultation:  Blood in ostomy  HPI: [name] is a 63 y.o. 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  She was at a rehab facility and was brought to the ER last night after patient was found to have blood in her colostomy bag.  The patient reports that she has experienced intermittent issues with bleeding via her ostomy since at least 2013.  In 03/2011 she underwent EGD by Dr. [name] that revealed portal gastropathy with vascular ectasia.  Colonoscopy via her ostomy at that time showed mild inflammation of the mucosa in the ostomy stump.  Those were followed by [facility], which revealed only a few tiny red spots without definite clinical significance.  Since that time she relocated to [place], [place] where she was living with her daughter and she reports that she underwent extensive procedures for the same reason while she was there as well and no cause of bleeding was found.  She was even sent to [facility] (looks like in 03/2012) for an antegrade small bowel enteroscopy.  We are going to try to obtain all of these records if possible.  She says that she continues to have bleeding intermittently, but it does not concern her because it resolves within a days time and no real cause has ever been found.  She says that she was told on one occasion that it looked like the bleeding was peristomal rather than coming from within the GI tract.    Patient states that she had initially past a couple of clots last night and the staff at the rehab facility insisted that she come here for evaluation, but she did not want to come in. This is her  first episode of bleeding since January.  Patient has chronic abdominal pain, which she states is not new. Denies any fever, chills, nausea, vomiting.  Patient's Hgb is stable since her admission at 9.9 grams this AM, but it is down overall from 11 grams just 3 weeks ago.  Looks like she had required blood transfusions and iron infusions while she was in [place] area, but it does not look like her Hgb ever went below 8.4 grams where she was here in 03/2011.   Past Medical History  Diagnosis Date  . Cirrhosis   . GERD (gastroesophageal reflux disease)   . Cervical disc syndrome   . Lung cancer     squamous cell  . Chronic respiratory failure   . Diverticulitis of colon   . Perforation of colon   . Arthritis   . IBS (irritable bowel syndrome)   . Chronic lower GI bleeding   . Overactive bladder   . Cervical cancer   . Thrombocytopenia     sees Dr. Julien Nordmann   . Splenomegaly   . Anxiety   . Depression   . Hypertension   . Asthma   . Chronic headache disorder   . Blood transfusion without reported diagnosis   . Heart murmur   . Hyperlipidemia   . Lung cancer   . COPD (chronic obstructive pulmonary disease)     sees Dr. Gwenette Greet   . Anemia     sees Dr. Julien Nordmann, due to chronic disease and GI losses   .  Diabetes mellitus     sees Dr. Cruzita Lederer   . Colostomy care   . Hypercalcemia     Past Surgical History  Procedure Laterality Date  . Appendectomy    . Cholecystectomy    . Colostomy    . Pneumonectomy    . Cervix removed    . Bowel resection    . Esophagogastroduodenoscopy  03/19/2011    Procedure: ESOPHAGOGASTRODUODENOSCOPY (EGD);  Surgeon: [name], MD;  Location: [facility];  Service: Endoscopy;  Laterality: N/A;  . Colonoscopy  03/19/2011    Procedure: COLONOSCOPY;  Surgeon: [name], MD;  Location: [facility];  Service: Endoscopy;  Laterality: N/A;  . Givens capsule study  03/20/2011    Procedure: GIVENS CAPSULE STUDY;  Surgeon: [name], MD;  Location: [facility];  Service: Endoscopy;  Laterality: N/A;  . Small bowel obstruction repair  March 2012  . Umbilical hernia repair  March 2012  . Splenectomy, total N/A 02/24/2013    Procedure: SPLENECTOMY;  Surgeon: Harl Bowie, MD;  Location: [facility];  Service: General;  Laterality: N/A;  . Orif patella fracture Left 04/21/2013    DR [name]  . Orif patella Left 04/21/2013    Procedure: OPEN REDUCTION INTERNAL (ORIF) FIXATION PATELLA;  Surgeon: Augustin Schooling, MD;  Location: [facility];  Service: Orthopedics;  Laterality: Left;    Prior to Admission medications   Medication Sig Start Date End Date Taking? Authorizing Provider  albuterol-ipratropium (COMBIVENT) 18-103 MCG/ACT inhaler Inhale 2 puffs into the lungs every 4 (four) hours as needed for wheezing or shortness of breath.    Yes Historical Provider, MD  aspirin 325 MG tablet Take 1 tablet (325 mg total) by mouth daily. 03/08/13  Yes Harl Bowie, MD  clindamycin (CLEOCIN) 300 MG capsule Take 300 mg by mouth 3 (three) times daily. 05/13/13 05/20/13 Yes Historical Provider, MD  diazepam (VALIUM) 5 MG tablet Take 1 tablet (5 mg total) by mouth 3 (three) times daily as needed for anxiety. 05/03/13  Yes Ivan Anchors Love, PA-C  dicyclomine (BENTYL) 10 MG capsule Take 10 mg by mouth 4 (four) times daily -  before meals and at bedtime.   Yes Historical Provider, MD  esomeprazole (NEXIUM) 40 MG capsule Take 40 mg by mouth every evening.   Yes Historical Provider, MD  fentaNYL (DURAGESIC - DOSED MCG/HR) 25 MCG/HR patch Place 1 patch (25 mcg total) onto the skin every 3 (three) days. 05/03/13  Yes Ivan Anchors Love, PA-C  Fluticasone-Salmeterol (ADVAIR) 250-50 MCG/DOSE AEPB Inhale 1 puff into the lungs every 12 (twelve) hours. 04/12/13  Yes [name], MD  folic acid (FOLVITE) 1 MG tablet Take 1 tablet (1 mg total) by mouth every evening. 04/12/13  Yes [name], MD  gabapentin (NEURONTIN) 100 MG capsule Take 1 capsule (100 mg total) by  mouth at bedtime. 05/03/13  Yes Ivan Anchors Love, PA-C  insulin glargine (LANTUS) 100 UNIT/ML injection Inject 0.4 mLs (40 Units total) into the skin 2 (two) times daily. 05/04/13  Yes Ivan Anchors Love, PA-C  methocarbamol (ROBAXIN) 500 MG tablet Take 1 tablet (500 mg total) by mouth every 6 (six) hours as needed for muscle spasms. 05/03/13  Yes Ivan Anchors Love, PA-C  metoCLOPramide (REGLAN) 10 MG tablet Take 1 tablet (10 mg total) by mouth 3 (three) times daily. 04/12/13  Yes [name], MD  ondansetron (ZOFRAN) 4 MG tablet Take 1 tablet (4 mg total) by mouth every 6 (six) hours  as needed for nausea. 05/03/13  Yes Ivan Anchors Love, PA-C  Oxycodone HCl 20 MG TABS Take 1 tablet (20 mg total) by mouth every 3 (three) hours as needed (pain). 05/03/13  Yes Ivan Anchors Love, PA-C  polyethylene glycol (MIRALAX / GLYCOLAX) packet Take 17 g by mouth daily as needed for mild constipation. 05/03/13  Yes Ivan Anchors Love, PA-C  rifaximin (XIFAXAN) 550 MG TABS tablet Take 550 mg by mouth 2 (two) times daily. 06/29/12  Yes [name], MD  traMADol (ULTRAM) 50 MG tablet Take 1 tablet (50 mg total) by mouth every 12 (twelve) hours as needed for moderate pain. 05/03/13  Yes Ivan Anchors Love, PA-C  traZODone (DESYREL) 100 MG tablet Take 2 tablets (200 mg total) by mouth at bedtime. 05/03/13  Yes Ivan Anchors Love, PA-C  Insulin Pen Needle (BD PEN NEEDLE NANO U/F) 32G X 4 MM MISC Use 4 times daily as directed. 12/30/12   Philemon Kingdom, MD    Current Facility-Administered Medications  Medication Dose Route Frequency Provider Last Rate Last Dose  . 0.9 %  sodium chloride infusion   Intravenous Continuous Eugenie Filler, MD 50 mL/hr at 05/19/13 0757    . acetaminophen (TYLENOL) tablet 650 mg  650 mg Oral Q6H PRN Rise Patience, MD       Or  . acetaminophen (TYLENOL) suppository 650 mg  650 mg Rectal Q6H PRN Rise Patience, MD      . ciprofloxacin (CIPRO) IVPB 400 mg  400 mg Intravenous BID Rise Patience, MD   400 mg at 05/19/13 0413   . fentaNYL (DURAGESIC - dosed mcg/hr) patch 25 mcg  25 mcg Transdermal Q72H Rise Patience, MD   25 mcg at 05/19/13 0542  . HYDROmorphone (DILAUDID) injection 1 mg  1 mg Intravenous Q2H PRN Rise Patience, MD   1 mg at 05/19/13 1120  . insulin aspart (novoLOG) injection 0-9 Units  0-9 Units Subcutaneous TID WC Rise Patience, MD   1 Units at 05/19/13 [phone]  . insulin glargine (LANTUS) injection 10 Units  10 Units Subcutaneous QHS Rise Patience, MD      . ipratropium-albuterol (DUONEB) 0.5-2.5 (3) MG/3ML nebulizer solution 3 mL  3 mL Nebulization Q4H PRN Rise Patience, MD      . mometasone-formoterol [facility]) 100-5 MCG/ACT inhaler 2 puff  2 puff Inhalation BID Rise Patience, MD   2 puff at 05/19/13 [phone]  . ondansetron (ZOFRAN) tablet 4 mg  4 mg Oral Q6H PRN Rise Patience, MD       Or  . ondansetron [facility]) injection 4 mg  4 mg Intravenous Q6H PRN Rise Patience, MD      . pantoprazole (PROTONIX) 80 mg in sodium chloride 0.9 % 250 mL infusion  8 mg/hr Intravenous Continuous Rise Patience, MD 25 mL/hr at 05/19/13 0715 8 mg/hr at 05/19/13 0715  . [START ON 05/22/2013] pantoprazole (PROTONIX) injection 40 mg  40 mg Intravenous Q12H Rise Patience, MD        Allergies as of 05/18/2013 - Review Complete 05/18/2013  Allergen Reaction Noted  . Penicillins Anaphylaxis and Rash 06/03/2006  . Codeine phosphate Other (See Comments) 06/03/2006  . Hydrocodone-acetaminophen Other (See Comments) 06/03/2006  . Morphine Other (See Comments) 09/15/2006  . Other Other (See Comments) 02/10/2013  . Cephalexin Swelling and Rash 06/03/2006    Family History  Problem Relation Age of Onset  . Coronary artery disease    . Diabetes  type II    . Anesthesia problems Neg Hx   . Hypotension Neg Hx   . Malignant hyperthermia Neg Hx   . Pseudochol deficiency Neg Hx   . Heart attack Mother   . Diabetes type II Mother   . Cirrhosis Father     History   Social  History  . Marital Status: Divorced    Spouse Name: N/A    Number of Children: N/A  . Years of Education: N/A   Occupational History  . disabled    Social History Main Topics  . Smoking status: Former Smoker -- 2.00 packs/day for 35 years    Types: Cigarettes    Quit date: 03/24/2002  . Smokeless tobacco: Never Used  . Alcohol Use: No  . Drug Use: No  . Sexual Activity: No   Other Topics Concern  . Not on file   Social History Narrative   Regular exercise: a little   Caffeine use: 2 cups of coffee in am          Review of Systems: Ten point ROS is O/W negative except as mentioned in HPI.  Physical Exam: Vital signs in last 24 hours: Temp:  [97.6 F (36.4 C)-99 F (37.2 C)] 97.6 F (36.4 C) (04/22 0630) Pulse Rate:  [42-82] 42 (04/22 0630) Resp:  [18] 18 (04/22 0630) BP: (121-183)/(41-63) 125/63 mmHg (04/22 0630) SpO2:  [90 %-100 %] 98 % (04/22 0835) Weight:  [203 lb 4.2 oz (92.2 kg)] 203 lb 4.2 oz (92.2 kg) (04/22 0355) Last BM Date: 05/19/13 General:   Alert, Well-developed, well-nourished, pleasant and cooperative in NAD Head:  Normocephalic and atraumatic. Eyes:  Sclera clear, no icterus.  Conjunctiva pink. Ears:  Normal auditory acuity. Mouth:  No deformity or lesions. Lungs:  Clear throughout to auscultation.  No wheezes, crackles, or rhonchi.  Heart:  Bradycardic.  No M/R/G. Abdomen:  Soft, non-distended.  BS present.  Several scars noted on abdomen.  Ostomy noted with dried blood; once cleaned it appeared pink and viable.  Actually appeared that the bleeding may have come from peri-stomal area.  Multiple small ecchymoses seen on abdomen from insulin injections. Rectal:  Deferred  Msk:  Symmetrical without gross deformities. Pulses:  Normal pulses noted. Extremities: Swelling noted in B/L LE's Neurologic:  Alert and  oriented x4;  grossly normal neurologically. Skin:  Intact without significant lesions or rashes. Psych:  Alert and cooperative. Normal  mood and affect.  Intake/Output from previous day: 04/21 0701 - 04/22 0700 In: -  Out: 10 [Stool:10] Intake/Output this shift: Total I/O In: 0  Out: 400 [Urine:400]  Lab Results:  Recent Labs  05/18/13 2330 05/19/13 0423 05/19/13 0803  WBC 13.0* 13.3* 14.4*  HGB 9.8* 9.6* 9.9*  HCT 29.9* 30.5* 30.8*  PLT 780* 739* 819*   BMET  Recent Labs  05/18/13 2330 05/19/13 0423  NA 125* 127*  K 5.2 5.1  CL 91* 93*  CO2 21 21  GLUCOSE 196* 151*  BUN 16 16  CREATININE 0.78 0.81  CALCIUM 9.6 9.4   LFT  Recent Labs  05/19/13 0423  PROT 6.7  ALBUMIN 2.4*  AST 27  ALT 29  ALKPHOS 178*  BILITOT 0.2*   PT/INR  Recent Labs  05/19/13 0423  LABPROT 13.5  INR 1.05   Studies/Results: Dg Abd Acute W/chest  05/19/2013   CLINICAL DATA:  GI bleed with bloody output and colostomy bag.  EXAM: ACUTE ABDOMEN SERIES (ABDOMEN 2 VIEW & CHEST 1 VIEW)  COMPARISON:  DG PELVIS 1-2 VIEWS dated 04/21/2013; DG CHEST 2 VIEW dated 04/21/2013  FINDINGS: Shallow inspiration with elevation of left hemidiaphragm. Heart size and pulmonary vascularity are normal for technique. Interstitial fibrosis in the lungs. Postoperative changes in the mediastinum. No change since prior chest.  Surgical clips in the right upper quadrant. Stool-filled colon. No small or large bowel distention. No radiopaque stones identified. Degenerative changes in the spine.  IMPRESSION: Negative abdominal radiographs.  No acute cardiopulmonary disease.   Electronically Signed   By: Lucienne Capers M.D.   On: 05/19/2013 00:29    IMPRESSION:  -Bleeding via ostomy:  This has been a recurrent issue and she has undergone extensive evaluation through this facility as well as [facility] and a facility in [place], [place] that has all been unremarkable.  The bleeding has resolved current and she says that it is always sporadic.  ? If the bleeding is peristomal. -Acute on chronic anemia:  Hgb down one gram from 3 weeks ago but  stable since admission. -Cirrhosis:  ? Etiology.  Appears compensated. -Recent splenectomy in 01/2013 for thrombocytopenia -S/p ORIF of patellar fracture in 03/2013 -Stage IB non-small cell lung cancer diagnosed in April of 2004 status post left upper lobectomy on observation -IDDM    PLAN: -Monitor Hgb. -Continue daily PPI therapy. -Likely no further evaluation/intervention will be required during this hospitalization, and we will try to obtain records from [facility] facility so that we have full endoscopic history.   Laban Emperor. Zehr  05/19/2013, 12:31 PM  Pager number [phone]  GI ATTENDING  History, laboratories, x-rays, prior endoscopy reports reviewed. Patient personally seen and examined. Agree with excellent H&P as outlined above. Complicated patient with multiple problems as outlined. Admitted from rehabilitation facility with minor bleeding per ostomy. Nothing since last night. Extensive workup previously for gastrointestinal bleeding. This quite minor. Most episodes have been monitored. Some excoriation at the end ostomy likely the cause. Stool in bag normal. No further GI workup indicated. Okay for patient to go back to rehabilitation facility. Available as needed, will sign off. Thanks  Docia Chuck. Geri Seminole., M.D. [facility] Division of Gastroenterology

## 2013-05-19 NOTE — Progress Notes (Signed)
ANTIBIOTIC CONSULT NOTE - INITIAL  Pharmacy Consult for ciprofloxacin Indication: Intra-abdominal Infection   Allergies  Allergen Reactions  . Penicillins Anaphylaxis and Rash  . Codeine Phosphate Other (See Comments)    REACTION: Stomach cramps  . Hydrocodone-Acetaminophen Other (See Comments)    REACTION: hallucinations  . Morphine Other (See Comments)    REACTION: Lowers BP  . Other Other (See Comments)    AGENT:  Per pt, cannot take blood thinners due to cirrhosis of the liver  . Cephalexin Swelling and Rash    Patient Measurements:   Adjusted Body Weight:   Vital Signs: Temp: 99 F (37.2 C) (04/21 2303) Temp src: Oral (04/21 2303) BP: 133/54 mmHg (04/22 0300) Pulse Rate: 52 (04/22 0300) Intake/Output from previous day:   Intake/Output from this shift:    Labs:  Recent Labs  05/18/13 2330  WBC 13.0*  HGB 9.8*  PLT 780*  CREATININE 0.78   The CrCl is unknown because both a height and weight (above a minimum accepted value) are required for this calculation. No results found for this basename: VANCOTROUGH, Corlis Leak, VANCORANDOM, [place], GENTPEAK, GENTRANDOM, TOBRATROUGH, TOBRAPEAK, TOBRARND, AMIKACINPEAK, AMIKACINTROU, AMIKACIN,  in the last 72 hours   Microbiology: Recent Results (from the past 720 hour(s))  URINE CULTURE     Status: None   Collection Time    04/27/13  4:44 AM      Result Value Ref Range Status   Specimen Description URINE, CLEAN CATCH   Final   Special Requests NONE   Final   Culture  Setup Time     Final   Value: 04/27/2013 03:05     Performed at [facility]     Final   Value: 50,000 COLONIES/ML     Performed at [facility]   Culture     Final   Value: Multiple bacterial morphotypes present, none predominant. Suggest appropriate recollection if clinically indicated.     Performed at [facility]   Report Status 04/28/2013 FINAL   Final    Medical History: Past Medical History   Diagnosis Date  . Cirrhosis   . GERD (gastroesophageal reflux disease)   . Cervical disc syndrome   . Lung cancer     squamous cell  . Chronic respiratory failure   . Diverticulitis of colon   . Perforation of colon   . Arthritis   . IBS (irritable bowel syndrome)   . Chronic lower GI bleeding   . Overactive bladder   . Cervical cancer   . Thrombocytopenia     sees Dr. Julien Nordmann   . Splenomegaly   . Anxiety   . Depression   . Hypertension   . Asthma   . Chronic headache disorder   . Blood transfusion without reported diagnosis   . Heart murmur   . Hyperlipidemia   . Lung cancer   . COPD (chronic obstructive pulmonary disease)     sees Dr. Gwenette Greet   . Anemia     sees Dr. Julien Nordmann, due to chronic disease and GI losses   . Diabetes mellitus     sees Dr. Cruzita Lederer   . Colostomy care   . Hypercalcemia     Medications:  Anti-infectives   Start     Dose/Rate Route Frequency Ordered Stop   05/19/13 0400  ciprofloxacin (CIPRO) IVPB 400 mg     400 mg 200 mL/hr over 60 Minutes Intravenous 2 times daily 05/19/13 0344  Assessment: Patient with Intra-abdominal Infection.    Goal of Therapy:  Ciprofloxacin dosed based on patient weight and renal function   Plan:  Ciprofloxacin 400 mg iv q12hr  [employer]. 05/19/2013,3:45 AM

## 2013-05-19 NOTE — H&P (Addendum)
[facility] History and Physical  PAQUITA PRINTY VFI:[id number] DOB: 1950-12-18 DOA: 05/18/2013  Referring physician: ER physician. PCP: [name], MD   Chief Complaint: Bleeding from the colostomy site.  HPI: [name] is a 63 y.o. female with history of cirrhosis of liver, diabetes mellitus, COPD was brought to the ER after patient was found to be having blood in her colostomy bag. Patient states that she had initially past clots of blood last night which has improved at this time. Patient has chronic abdominal pain which she states is not new. Denies any fever chills nausea vomiting. Patient's medication list shows patient on clindamycin but patient does not recall taking it. Patient was recently admitted in the hospital for left patellar fracture and had had surgery. Presently patient is in rehabilitation. Denies using any NSAIDs. Medication list shows aspirin. Patient at this time is hemodynamically stable. Patient has had EGD and colonoscopy in 2013. EGD showed mild vascular ectasia and colonoscopy showed mild inflammation in the ostomy site.   Review of Systems: As presented in the history of presenting illness, rest negative.  Past Medical History  Diagnosis Date  . Cirrhosis   . GERD (gastroesophageal reflux disease)   . Cervical disc syndrome   . Lung cancer     squamous cell  . Chronic respiratory failure   . Diverticulitis of colon   . Perforation of colon   . Arthritis   . IBS (irritable bowel syndrome)   . Chronic lower GI bleeding   . Overactive bladder   . Cervical cancer   . Thrombocytopenia     sees Dr. Julien Nordmann   . Splenomegaly   . Anxiety   . Depression   . Hypertension   . Asthma   . Chronic headache disorder   . Blood transfusion without reported diagnosis   . Heart murmur   . Hyperlipidemia   . Lung cancer   . COPD (chronic obstructive pulmonary disease)     sees Dr. Gwenette Greet   . Anemia     sees Dr. Julien Nordmann, due to chronic disease and  GI losses   . Diabetes mellitus     sees Dr. Cruzita Lederer   . Colostomy care   . Hypercalcemia    Past Surgical History  Procedure Laterality Date  . Appendectomy    . Cholecystectomy    . Colostomy    . Pneumonectomy    . Cervix removed    . Bowel resection    . Esophagogastroduodenoscopy  03/19/2011    Procedure: ESOPHAGOGASTRODUODENOSCOPY (EGD);  Surgeon: [name], MD;  Location: [facility];  Service: Endoscopy;  Laterality: N/A;  . Colonoscopy  03/19/2011    Procedure: COLONOSCOPY;  Surgeon: [name], MD;  Location: [facility];  Service: Endoscopy;  Laterality: N/A;  . Givens capsule study  03/20/2011    Procedure: GIVENS CAPSULE STUDY;  Surgeon: [name], MD;  Location: [facility];  Service: Endoscopy;  Laterality: N/A;  . Small bowel obstruction repair  March 2012  . Umbilical hernia repair  March 2012  . Splenectomy, total N/A 02/24/2013    Procedure: SPLENECTOMY;  Surgeon: Harl Bowie, MD;  Location: [facility];  Service: General;  Laterality: N/A;  . Orif patella fracture Left 04/21/2013    DR [name]  . Orif patella Left 04/21/2013    Procedure: OPEN REDUCTION INTERNAL (ORIF) FIXATION PATELLA;  Surgeon: Augustin Schooling, MD;  Location: [facility];  Service: Orthopedics;  Laterality: Left;   Social History:  reports that she quit smoking about 11 years ago. Her smoking use included Cigarettes. She has a 70 pack-year smoking history. She has never used smokeless tobacco. She reports that she does not drink alcohol or use illicit drugs. Where does patient live presently living in rehabilitation. Can patient participate in ADLs? Not sure.  Allergies  Allergen Reactions  . Penicillins Anaphylaxis and Rash  . Codeine Phosphate Other (See Comments)    REACTION: Stomach cramps  . Hydrocodone-Acetaminophen Other (See Comments)    REACTION: hallucinations  . Morphine Other (See Comments)    REACTION: Lowers BP  . Other Other (See Comments)    AGENT:  Per pt,  cannot take blood thinners due to cirrhosis of the liver  . Cephalexin Swelling and Rash    Family History:  Family History  Problem Relation Age of Onset  . Coronary artery disease    . Diabetes type II    . Anesthesia problems Neg Hx   . Hypotension Neg Hx   . Malignant hyperthermia Neg Hx   . Pseudochol deficiency Neg Hx   . Heart attack Mother   . Diabetes type II Mother   . Cirrhosis Father       Prior to Admission medications   Medication Sig Start Date End Date Taking? Authorizing Provider  albuterol-ipratropium (COMBIVENT) 18-103 MCG/ACT inhaler Inhale 2 puffs into the lungs every 4 (four) hours as needed for wheezing or shortness of breath.    Yes Historical Provider, MD  aspirin 325 MG tablet Take 1 tablet (325 mg total) by mouth daily. 03/08/13  Yes Harl Bowie, MD  clindamycin (CLEOCIN) 300 MG capsule Take 300 mg by mouth 3 (three) times daily. 05/13/13 05/20/13 Yes Historical Provider, MD  diazepam (VALIUM) 5 MG tablet Take 1 tablet (5 mg total) by mouth 3 (three) times daily as needed for anxiety. 05/03/13  Yes Ivan Anchors Love, PA-C  dicyclomine (BENTYL) 10 MG capsule Take 10 mg by mouth 4 (four) times daily -  before meals and at bedtime.   Yes Historical Provider, MD  esomeprazole (NEXIUM) 40 MG capsule Take 40 mg by mouth every evening.   Yes Historical Provider, MD  fentaNYL (DURAGESIC - DOSED MCG/HR) 25 MCG/HR patch Place 1 patch (25 mcg total) onto the skin every 3 (three) days. 05/03/13  Yes Ivan Anchors Love, PA-C  Fluticasone-Salmeterol (ADVAIR) 250-50 MCG/DOSE AEPB Inhale 1 puff into the lungs every 12 (twelve) hours. 04/12/13  Yes [name], MD  folic acid (FOLVITE) 1 MG tablet Take 1 tablet (1 mg total) by mouth every evening. 04/12/13  Yes [name], MD  gabapentin (NEURONTIN) 100 MG capsule Take 1 capsule (100 mg total) by mouth at bedtime. 05/03/13  Yes Ivan Anchors Love, PA-C  insulin glargine (LANTUS) 100 UNIT/ML injection Inject 0.4 mLs (40 Units total) into the  skin 2 (two) times daily. 05/04/13  Yes Ivan Anchors Love, PA-C  methocarbamol (ROBAXIN) 500 MG tablet Take 1 tablet (500 mg total) by mouth every 6 (six) hours as needed for muscle spasms. 05/03/13  Yes Ivan Anchors Love, PA-C  metoCLOPramide (REGLAN) 10 MG tablet Take 1 tablet (10 mg total) by mouth 3 (three) times daily. 04/12/13  Yes [name], MD  ondansetron (ZOFRAN) 4 MG tablet Take 1 tablet (4 mg total) by mouth every 6 (six) hours as needed for nausea. 05/03/13  Yes Ivan Anchors Love, PA-C  Oxycodone HCl 20 MG TABS Take 1 tablet (20 mg total) by mouth every 3 (  three) hours as needed (pain). 05/03/13  Yes Ivan Anchors Love, PA-C  polyethylene glycol (MIRALAX / GLYCOLAX) packet Take 17 g by mouth daily as needed for mild constipation. 05/03/13  Yes Ivan Anchors Love, PA-C  rifaximin (XIFAXAN) 550 MG TABS tablet Take 550 mg by mouth 2 (two) times daily. 06/29/12  Yes [name], MD  traMADol (ULTRAM) 50 MG tablet Take 1 tablet (50 mg total) by mouth every 12 (twelve) hours as needed for moderate pain. 05/03/13  Yes Ivan Anchors Love, PA-C  traZODone (DESYREL) 100 MG tablet Take 2 tablets (200 mg total) by mouth at bedtime. 05/03/13  Yes Ivan Anchors Love, PA-C  Insulin Pen Needle (BD PEN NEEDLE NANO U/F) 32G X 4 MM MISC Use 4 times daily as directed. 12/30/12   Philemon Kingdom, MD    Physical Exam: Filed Vitals:   05/19/13 0015 05/19/13 0130 05/19/13 0200 05/19/13 0300  BP: 183/46 152/41 121/62 133/54  Pulse: 48 51 47 52  Temp:      TempSrc:      Resp:      SpO2: 94% 90% 92% 94%     General:  Well-developed and nourished.  Eyes: Anicteric no pallor.  ENT: No discharge from the ears eyes nose mouth.  Neck: No mass felt.  Cardiovascular: S1-S2 heard.  Respiratory: No rhonchi or crepitations.  Abdomen: Soft nontender bowel sounds present. Ostomy bag has some blood-colored fluid.  Skin: No rash.  Musculoskeletal: No edema. Left knee in brace.  Psychiatric: Appears normal.  Neurologic: Alert awake oriented to  time place and person. Moves all extremities.  Labs on Admission:  Basic Metabolic Panel:  Recent Labs Lab 05/18/13 2330  NA 125*  K 5.2  CL 91*  CO2 21  GLUCOSE 196*  BUN 16  CREATININE 0.78  CALCIUM 9.6   Liver Function Tests:  Recent Labs Lab 05/18/13 2330  AST 30  ALT 31  ALKPHOS 193*  BILITOT 0.3  PROT 6.9  ALBUMIN 2.5*   No results found for this basename: LIPASE, AMYLASE,  in the last 168 hours No results found for this basename: AMMONIA,  in the last 168 hours CBC:  Recent Labs Lab 05/18/13 2330  WBC 13.0*  HGB 9.8*  HCT 29.9*  MCV 81.5  PLT 780*   Cardiac Enzymes: No results found for this basename: CKTOTAL, CKMB, CKMBINDEX, TROPONINI,  in the last 168 hours  BNP (last 3 results)  Recent Labs  11/13/12 1334  PROBNP 67.0   CBG: No results found for this basename: GLUCAP,  in the last 168 hours  Radiological Exams on Admission: Dg Abd Acute W/chest  05/19/2013   CLINICAL DATA:  GI bleed with bloody output and colostomy bag.  EXAM: ACUTE ABDOMEN SERIES (ABDOMEN 2 VIEW & CHEST 1 VIEW)  COMPARISON:  DG PELVIS 1-2 VIEWS dated 04/21/2013; DG CHEST 2 VIEW dated 04/21/2013  FINDINGS: Shallow inspiration with elevation of left hemidiaphragm. Heart size and pulmonary vascularity are normal for technique. Interstitial fibrosis in the lungs. Postoperative changes in the mediastinum. No change since prior chest.  Surgical clips in the right upper quadrant. Stool-filled colon. No small or large bowel distention. No radiopaque stones identified. Degenerative changes in the spine.  IMPRESSION: Negative abdominal radiographs.  No acute cardiopulmonary disease.   Electronically Signed   By: Lucienne Capers M.D.   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 GI bleed -  patient has been kept n.p.o. Follow CBC q. 4 hourly. Type and screen. I have placed patient on Protonix infusion. Since patient also has cirrhosis of the liver I have empirically placed patient on antibiotics. Consult GI in a.m. Presently patient is hemodynamically stable. Patient states she has chronic diarrhea. Check stool for C. difficile. 2. Diabetes mellitus - since patient is n.p.o. I have decreased patient's Lantus dose to 10 units subcutaneous every 12 hourly once patient starts eating change dose to regular home dose. Sliding-scale coverage. 3. COPD - presently not wheezing. 4. Anemia - probably secondary to blood loss. 5. History of splenectomy and colostomy. 6. History of non-small cell lung cancer. 7. Recent left patellar fracture status post surgery.    Code Status: Full code.  Family Communication: None.  Disposition Plan: Admit to inpatient.    [facility] Pager [phone].  If 7PM-7AM, please contact night-coverage [email or web address] Password [facility] 05/19/2013, 3:29 AM

## 2013-05-19 NOTE — Progress Notes (Signed)
OT Cancellation Note  Patient Details Name: [name] MRN: [id number] DOB: 05-17-1950   Cancelled Treatment:    Reason Eval/Treat Not Completed: Other (comment).  Noted pt came in from SNF and plans to return there. Will defer OT eval to that venue.  Lesle Chris 05/19/2013, 3:00 PM Lesle Chris, OTR/L [phone] 05/19/2013

## 2013-05-19 NOTE — Consult Note (Signed)
WOC asked to see this patient to remove and replace pouch at the time when GI may come around to evaluate patient. Plans for possible dc today and unclear on the time of GI visit.  I have ordered needed supplies and met with patient she with the assistance of her nurse will remove her pouch at the time of the GI consult and then replace.  Discussed POC with patient and bedside nurse.  Re consult if needed, will not follow at this time. Thanks  Aladdin Kollmann [place], [place] [phone])

## 2013-05-19 NOTE — Progress Notes (Signed)
I have seen and assessed patient and agree with Dr Moise Boring assessment and plan. Patient states bleeding has stopped. Patient tearful and wanting to go back to facility. Continue serial CBC, PPI. GI consultation pending. Patient with recent patellar fracture. PT/OT. Follow.

## 2013-05-19 NOTE — Progress Notes (Signed)
Clinical Social Work Department BRIEF PSYCHOSOCIAL ASSESSMENT 05/19/2013  Patient:  [name], [name]     Account Number:  [id number]     [place] date:  05/18/2013  Clinical Social Worker:  Earlie Server  Date/Time:  05/19/2013 09:45 AM  Referred by:  Physician  Date Referred:  05/19/2013 Referred for  SNF Placement   Other Referral:   Interview type:  Patient Other interview type:    PSYCHOSOCIAL DATA Living Status:  FACILITY Admitted from facility:  [facility] Level of care:  [place] Primary support name:  Tim Primary support relationship to patient:  CHILD, ADULT Degree of support available:   Strong    CURRENT CONCERNS Current Concerns  Post-Acute Placement   Other Concerns:    SOCIAL WORK ASSESSMENT / PLAN CSW received referral in order to complete assessment due to patient being admitted from a facility. CSW reviewed chart and met with patient at bedside. CSW introduced myself and explained role.    Patient reports that she was at [place] to [facility] for additional rehab. CSW spoke with patient about her DC plans and patient plans to return to SNF at DC. Patient reports she has already spoken with family and wants to return to the same SNF. CSW explained process and agreeable to assist with DC plans.    CSW completed FL2 and placed on chart for MD signature. CSW contacted SNF who is agreeable to accept patient when medically stable. CSW will continue to follow.   Assessment/plan status:  Psychosocial Support/Ongoing Assessment of Needs Other assessment/ plan:   Information/referral to community resources:   Will return to SNF    PATIENT'S/FAMILY'S RESPONSE TO PLAN OF CARE: Patient alert and oriented. Patient reports that she enjoys SNF and wants to continue rehab. Patient reports that she wants to talk with the MD as soon as possible because she wants to be able to DC soon. Patient reports that she is happy with care  and is happy that she will soon be able to return home. Patient engaged during assessment and thanked CSW for visit. Patient agreeable for CSW to assist with transfer back to facility.       [place], [place] [phone]

## 2013-05-19 NOTE — Progress Notes (Signed)
[name]  Telephone:([phone]) [phone]    HOSPITAL PROGRESS NOTE  HPI: but had been informed of the patient's admission. [name] is a 63 year old woman patient of Dr. Julien Nordmann with a stage IB non-small cell lung cancer diagnosed in April of 2004 status post left upper lobectomy on observation.Oher issues follow that the [place] includes anemia and thrombocytopenia, which were resolved after splenectomy and hypercalcemia likely secondary to excessive intake of vitamin D. She was last seen at our office on 04/13/2013, at which time she was clinically stable. Patient was admitted on 05/18/2013 after bleeding was noted in her colostomy bag following admission hospitalization in in late March until 05/04/2013 for a left patellar fracture requiring surgery. She now lives in a Rehab facility. She denies the use of NSAIDS during this period of time but  was on aspirin. She has chronic abdominal discomfort, but no new areas of pain, nausea or vomiting,but does have chronic diarrhea. She has no other areas of bleeding. No respiratory or cardiac complaints. No fever or chills. No confusion.she had an H&H of 9.8 and 29.9 with platelet count elevated at 780. White count, likely reactive, was elevated at 13. She was placed on n.p.o., and Protonix was given IV for relief and antibiotics. She states her bleeding is much improved today.She is being well managed by the admitting team.  MEDICATIONS:  Scheduled Meds: . ciprofloxacin  400 mg Intravenous BID  . fentaNYL  25 mcg Transdermal Q72H  . insulin aspart  0-9 Units Subcutaneous TID WC  . insulin glargine  10 Units Subcutaneous QHS  . mometasone-formoterol  2 puff Inhalation BID  . [START ON 05/22/2013] pantoprazole (PROTONIX) IV  40 mg Intravenous Q12H   Continuous Infusions: . sodium chloride 50 mL/hr at 05/19/13 0757  . pantoprozole (PROTONIX) infusion 8 mg/hr (05/19/13 0715)   PRN Meds:.acetaminophen, acetaminophen,  HYDROmorphone (DILAUDID) injection, ipratropium-albuterol, ondansetron (ZOFRAN) IV, ondansetron  ALLERGIES:  Allergies  Allergen Reactions  . Penicillins Anaphylaxis and Rash  . Codeine Phosphate Other (See Comments)    REACTION: Stomach cramps  . Hydrocodone-Acetaminophen Other (See Comments)    REACTION: hallucinations  . Morphine Other (See Comments)    REACTION: Lowers BP  . Other Other (See Comments)    AGENT:  Per pt, cannot take blood thinners due to cirrhosis of the liver  . Cephalexin Swelling and Rash     PHYSICAL EXAMINATION:   Filed Vitals:   05/19/13 0630  BP: 125/63  Pulse: 42  Temp: 97.6 F (36.4 C)  Resp: 18   Filed Weights   05/19/13 0355  Weight: 203 lb 4.2 oz (92.36 kg)    63 year old in no acute distress, conversant, alert and oriented to time, place and date.  General well-developed and well-nourished  HEENT: Normocephalic, atraumatic.Sclera anicteric.Oral cavity without thrush or lesions. Neck: supple.No thyromegaly,no cervical or supraclavicular adenopathy  Lungs:clear to auscultation. No wheezing,rhonchi or rales. Cardiac:regular rate and rhythm,no murmur,rubs or gallops Abdomen: soft nontender,bowel sounds x4. No hepatomegaly.Ostomy bag with some bloody discharge Extremities:no clubbing cyanosis or edema. No petechial rash. left knee brace Neuro:No focal or motor deficits  LABORATORY/RADIOLOGY DATA:   Recent Labs Lab 05/18/13 2330 05/19/13 0423  WBC 13.0* 13.3*  HGB 9.8* 9.6*  HCT 29.9* 30.5*  PLT 780* 739*  MCV 81.5 82.7  MCH 26.7 26.0  MCHC 32.8 31.5  RDW 15.7* 15.9*    CMP    Recent Labs Lab 05/18/13 2330 05/19/13 0423  NA 125* 127*  K 5.2 5.1  CL 91* 93*  CO2 21 21  GLUCOSE 196* 151*  BUN 16 16  CREATININE 0.78 0.81  CALCIUM 9.6 9.4  AST 30 27  ALT 31 29  ALKPHOS 193* 178*  BILITOT 0.3 0.2*        Component Value Date/Time   BILITOT 0.2* 2013-06-01 0423   BILITOT 0.30 04/13/2013 1447   BILIDIR 0.1  11/14/2012 1515   IBILI 0.3 11/14/2012 1515      Recent Labs Lab Jun 01, 2013 0423  INR 1.05        Liver Function Tests:  Recent Labs Lab 05/18/13 2330 01-Jun-2013 0423  AST 30 27  ALT 31 29  ALKPHOS 193* 178*  BILITOT 0.3 0.2*  PROT 6.9 6.7  ALBUMIN 2.5* 2.4*    CBG:  Recent Labs Lab 06/01/13 0350 2013/06/01 0750  GLUCAP 144* 134*   Radiology Studies:   Dg Abd Acute W/chest  06/01/2013   CLINICAL DATA:  GI bleed with bloody output and colostomy bag.  EXAM: ACUTE ABDOMEN SERIES (ABDOMEN 2 VIEW & CHEST 1 VIEW)  COMPARISON:  DG PELVIS 1-2 VIEWS dated 04/21/2013; DG CHEST 2 VIEW dated 04/21/2013  FINDINGS: Shallow inspiration with elevation of left hemidiaphragm. Heart size and pulmonary vascularity are normal for technique. Interstitial fibrosis in the lungs. Postoperative changes in the mediastinum. No change since prior chest.  Surgical clips in the right upper quadrant. Stool-filled colon. No small or large bowel distention. No radiopaque stones identified. Degenerative changes in the spine.  IMPRESSION: Negative abdominal radiographs.  No acute cardiopulmonary disease.   Electronically Signed   By: Lucienne Capers M.D.   On: 06/01/2013 00:29       ASSESSMENT AND PLAN:  #66 63 year old woman patient of Dr. Julien Nordmann with a stage IB non-small cell lung cancer diagnosed in April of 2004 status post left upper lobectomy on observation #2 history of anemia,chronic, in the setting of acute blood loss.continue to monitor at this time. GI consult this morning. #3 history of thrombocytopenia, status post splenectomy. she was on aspirin placed on hold due to bleeding. They appear more elevated since admission on the setting of dehydration. Continue to monitor. #4 history of hypercalcemia,currently normal values. #5 colostomy site bleeding, GI to see. Other medical issues as per day admitting team. Thank you very much for allowing [place] the opportunity to participate in the care of  this nice patient  **Disclaimer: This note was dictated with voice recognition software. Similar sounding words can inadvertently be transcribed and this note may contain transcription errors which may not have been corrected upon publication of note.Rondel Jumbo, PA-C 06/01/13, 8:15 AM  ADDENDUM: Hematology/Oncology Attending: The patient is seen and examined. I agree with the above note. She is a very pleasant 63 years old white female with history of stage IB non-small cell lung cancer diagnosed more than 10 years ago status post left upper lobectomy the patient also has a history of anemia and thrombocytopenia that completely resolved after she had splenectomy with the active thrombocytosis. She was admitted for evaluation of GI bleed from her colostomy. Her hemoglobin is lobe but stable today. Her thrombocytosis is reactive in nature after splenectomy. No need for intervention. The patient would benefit from GI evaluation for the questionable GI bleed. I would see her back for follow up visit as previously scheduled at the [facility].  Thank you for taking good care of Ms. Vannatter. Please call if you have any questions.

## 2013-05-20 DIAGNOSIS — E875 Hyperkalemia: Secondary | ICD-10-CM | POA: Diagnosis not present

## 2013-05-20 LAB — BASIC METABOLIC PANEL
BUN: 16 mg/dL (ref 6–23)
BUN: 13 mg/dL (ref 6–23)
CALCIUM: 9 mg/dL (ref 8.4–10.5)
CALCIUM: 9.4 mg/dL (ref 8.4–10.5)
CHLORIDE: 98 meq/L (ref 96–112)
CO2: 22 mEq/L (ref 19–32)
CO2: 19 meq/L (ref 19–32)
Chloride: 99 mEq/L (ref 96–112)
Creatinine, Ser: 0.89 mg/dL (ref 0.50–1.10)
Creatinine, Ser: 0.76 mg/dL (ref 0.50–1.10)
GFR calc Af Amer: >90 mL/min (ref >90)
GFR calc non Af Amer: 69 mL/min — ABNORMAL LOW (ref >90)
GFR calc non Af Amer: 89 mL/min — ABNORMAL LOW (ref >90)
GFR, EST AFRICAN AMERICAN: 79 mL/min — AB (ref >90)
Glucose, Bld: 139 mg/dL — ABNORMAL HIGH (ref 70–99)
Glucose, Bld: 247 mg/dL — ABNORMAL HIGH (ref 70–99)
POTASSIUM: 4.9 meq/L (ref 3.7–5.3)
Potassium: 5.8 mEq/L — ABNORMAL HIGH (ref 3.7–5.3)
Sodium: 131 mEq/L — ABNORMAL LOW (ref 137–147)
Sodium: 133 mEq/L — ABNORMAL LOW (ref 137–147)

## 2013-05-20 LAB — CBC
HEMATOCRIT: 30.8 % — AB (ref 36.0–46.0)
Hemoglobin: 9.8 g/dL — ABNORMAL LOW (ref 12.0–15.0)
MCH: 26 pg (ref 26.0–34.0)
MCHC: 31.8 g/dL (ref 30.0–36.0)
MCV: 81.7 fL (ref 78.0–100.0)
Platelets: 840 10*3/uL — ABNORMAL HIGH (ref 150–400)
RBC: 3.77 MIL/uL — ABNORMAL LOW (ref 3.87–5.11)
RDW: 16.2 % — ABNORMAL HIGH (ref 11.5–15.5)
WBC: 13.3 10*3/uL — ABNORMAL HIGH (ref 4.0–10.5)

## 2013-05-20 LAB — GLUCOSE, CAPILLARY
GLUCOSE-CAPILLARY: 137 mg/dL — AB (ref 70–99)
Glucose-Capillary: 168 mg/dL — ABNORMAL HIGH (ref 70–99)
Glucose-Capillary: 171 mg/dL — ABNORMAL HIGH (ref 70–99)

## 2013-05-20 MED ORDER — SODIUM POLYSTYRENE SULFONATE 15 GM/60ML PO SUSP
45.0000 g | Freq: Once | ORAL | Status: AC
  Administered 2013-05-20: 45 g via ORAL
  Filled 2013-05-20: qty 180

## 2013-05-20 MED ORDER — TRAMADOL HCL 50 MG PO TABS
50.0000 mg | ORAL_TABLET | Freq: Two times a day (BID) | ORAL | Status: DC | PRN
  Administered 2013-05-20: 50 mg via ORAL
  Filled 2013-05-20: qty 1

## 2013-05-20 MED ORDER — OXYCODONE HCL 5 MG PO TABS
20.0000 mg | ORAL_TABLET | ORAL | Status: DC | PRN
  Administered 2013-05-20 (×2): 20 mg via ORAL
  Filled 2013-05-20 (×2): qty 4

## 2013-05-20 MED ORDER — FENTANYL 25 MCG/HR TD PT72: 25.0000 ug | MEDICATED_PATCH | TRANSDERMAL | Status: DC

## 2013-05-20 MED ORDER — GABAPENTIN 100 MG PO CAPS
100.0000 mg | ORAL_CAPSULE | Freq: Every day | ORAL | Status: DC
  Filled 2013-05-20: qty 1

## 2013-05-20 MED ORDER — METHOCARBAMOL 500 MG PO TABS: 500.0000 mg | ORAL_TABLET | Freq: Four times a day (QID) | ORAL | Status: DC | PRN

## 2013-05-20 MED ORDER — TRAMADOL HCL 50 MG PO TABS: 50.0000 mg | ORAL_TABLET | Freq: Two times a day (BID) | ORAL | Status: DC | PRN

## 2013-05-20 MED ORDER — DIAZEPAM 5 MG PO TABS: 5.0000 mg | ORAL_TABLET | Freq: Three times a day (TID) | ORAL | Status: DC | PRN

## 2013-05-20 MED ORDER — IPRATROPIUM-ALBUTEROL 0.5-2.5 (3) MG/3ML IN SOLN: 3.0000 mL | RESPIRATORY_TRACT | Status: DC | PRN

## 2013-05-20 MED ORDER — PANTOPRAZOLE SODIUM 40 MG PO TBEC: 40.0000 mg | DELAYED_RELEASE_TABLET | Freq: Every day | ORAL | Status: DC

## 2013-05-20 MED ORDER — FOLIC ACID 1 MG PO TABS
1.0000 mg | ORAL_TABLET | Freq: Every evening | ORAL | Status: DC
  Filled 2013-05-20: qty 1

## 2013-05-20 MED ORDER — RIFAXIMIN 550 MG PO TABS
550.0000 mg | ORAL_TABLET | Freq: Two times a day (BID) | ORAL | Status: DC
  Administered 2013-05-20: 550 mg via ORAL
  Filled 2013-05-20 (×2): qty 1

## 2013-05-20 MED ORDER — DICYCLOMINE HCL 10 MG PO CAPS
10.0000 mg | ORAL_CAPSULE | Freq: Three times a day (TID) | ORAL | Status: DC
  Administered 2013-05-20 (×2): 10 mg via ORAL
  Filled 2013-05-20 (×4): qty 1

## 2013-05-20 MED ORDER — OXYCODONE HCL 20 MG PO TABS: 20.0000 mg | ORAL_TABLET | ORAL | Status: DC | PRN

## 2013-05-20 NOTE — Progress Notes (Signed)
Received referral from [facility] program for [facility] services. Noted patient is from SNF and likely to return there. Therefore patient not appropriate for [facility] Care Management at this time.  [facility] Liaison- [phone]

## 2013-05-20 NOTE — Discharge Summary (Signed)
Physician Discharge Summary  [name] PPI:[id number] DOB: April 06, 1950 DOA: 05/18/2013  PCP: [name], MD  Admit date: 05/18/2013 Discharge date: 05/20/2013  Time spent: 65 minutes  Recommendations for Outpatient Follow-up:  1. Patient will be discharged to a skilled nursing facility at [facility]. Patient will followup with M.D. at the skilled nursing facility. Patient will need a CBC and a basic metabolic profile done one week post discharge to followup on her hemoglobin as well as electrolytes and renal function.  Discharge Diagnoses:  Principal Problem:   GI bleed Active Problems:   Type II or unspecified type diabetes mellitus with neurological manifestations, uncontrolled   COPD   Cirrhosis of liver not due to alcohol   Hyperkalemia   Discharge Condition: Stable and improved  Diet recommendation: Carb modified/low sodium diet  Filed Weights   05/19/13 0355 05/20/13 0500  Weight: 92.2 kg (203 lb 4.2 oz) 92.5 kg (203 lb 14.8 oz)    History of present illness:  [name] is a 63 y.o. female with history of cirrhosis of liver, diabetes mellitus, COPD was brought to the ER after patient was found to be having blood in her colostomy bag. Patient states that she had initially past clots of blood last night which has improved at this time. Patient has chronic abdominal pain which she states is not new. Denies any fever chills nausea vomiting. Patient's medication list shows patient on clindamycin but patient does not recall taking it. Patient was recently admitted in the hospital for left patellar fracture and had had surgery. Presently patient is in rehabilitation. Denies using any NSAIDs. Medication list shows aspirin. Patient at this time is hemodynamically stable. Patient has had EGD and colonoscopy in 2013. EGD showed mild vascular ectasia and colonoscopy showed mild inflammation in the ostomy site.      Hospital Course:  #1 GI bleed versus  peristomal bleeding Patient was admitted with complaints of bloody clots noted through the colostomy bag at the nursing facility. Patient was admitted and initially made n.p.o. placed on a Protonix drip serial CBCs were obtained and a GI consultation was also obtained. Patient improved clinically did not have any further bleeding noted in the colostomy bag. Patient's hemoglobin remained stable such that by day of discharge hemoglobin was 9.8. Patient was seen in consultation by Dr. Gastroenterology and it was noted that patient had undergone an extensive evaluation at [facility] as well as [facility] in the facility in [facility] at all been unremarkable. Patient's bleeding resolved and it was felt at this time that no further evaluation or intervention was required during this hospitalization. It was recommended that patient remain on daily PPI. Patient will be discharged in stable and improved condition. Patient will need a repeat CBC done one week post discharge.  #2 hyperkalemia During the hospitalization patient was noted to be hyperkalemic with a potassium as high as 5.8. Patient remained asymptomatic. Patient was given some Kayexalate with resolution of her hyperkalemia. Potassium at discharge was 4.9. Patient will need a repeat basic metabolic profile done one week post discharge.  #3 diabetes mellitus Patient was maintained on Lantus as well as sliding scale insulin throughout the hospitalization.  #4 COPD Remained stable.  #5 anemia/acute blood loss anemia Felt to be secondary to bleeding noted in colostomy bag. Patient did not have any further bleeding. Patient's hemoglobin stabilized such that by day of discharge her hemoglobin was 9.8. Patient will followup as outpatient.  #6 history of  non-small cell lung cancer Remained stable during the hospitalization. Patient was seen by her oncologist Dr. Lorna Few who felt patient to followup as scheduled at  the cancer center as outpatient.  The rest of patient's chronic medical issues remained stable throughout the hospitalization the patient be discharged in stable and improved condition.  Procedures:  Acute abdominal series 05/19/2013  Consultations:  GI: Dr Henrene Pastor 05/19/13  Discharge Exam: Filed Vitals:   05/20/13 1430  BP: 160/62  Pulse: 88  Temp: 97.7 F (36.5 C)  Resp: 18    General: NAD Cardiovascular: RRR Respiratory: CTAB  Discharge Instructions You were cared for by a hospitalist during your hospital stay. If you have any questions about your discharge medications or the care you received while you were in the hospital after you are discharged, you can call the unit and asked to speak with the hospitalist on call if the hospitalist that took care of you is not available. Once you are discharged, your primary care physician will handle any further medical issues. Please note that NO REFILLS for any discharge medications will be authorized once you are discharged, as it is imperative that you return to your primary care physician (or establish a relationship with a primary care physician if you do not have one) for your aftercare needs so that they can reassess your need for medications and monitor your lab values.      Discharge Orders   Future Appointments Provider Department Dept Phone   06/22/2013 1:15 PM [facility] [phone]   06/22/2013 1:45 PM Curt Bears, MD [facility] [phone]   06/22/2013 2:15 PM [facility] [phone]   06/25/2013 10:20 AM Meredith Staggers, MD [facility] [phone]   10/13/2013 10:45 AM [name], MD [facility]   Future Orders Complete By Expires   Diet Carb Modified  As directed    Scheduling Instructions:   Low sodium   Discharge  instructions  As directed    Increase activity slowly  As directed        Medication List    STOP taking these medications       clindamycin 300 MG capsule  Commonly known as:  CLEOCIN      TAKE these medications       albuterol-ipratropium 18-103 MCG/ACT inhaler  Commonly known as:  COMBIVENT  Inhale 2 puffs into the lungs every 4 (four) hours as needed for wheezing or shortness of breath.     aspirin 325 MG tablet  Take 1 tablet (325 mg total) by mouth daily.     diazepam 5 MG tablet  Commonly known as:  VALIUM  Take 1 tablet (5 mg total) by mouth 3 (three) times daily as needed for anxiety.     dicyclomine 10 MG capsule  Commonly known as:  BENTYL  Take 10 mg by mouth 4 (four) times daily -  before meals and at bedtime.     esomeprazole 40 MG capsule  Commonly known as:  NEXIUM  Take 40 mg by mouth every evening.     fentaNYL 25 MCG/HR patch  Commonly known as:  DURAGESIC - dosed mcg/hr  Place 1 patch (25 mcg total) onto the skin every 3 (three) days.     Fluticasone-Salmeterol 250-50 MCG/DOSE Aepb  Commonly known as:  ADVAIR  Inhale 1 puff into the lungs every 12 (  twelve) hours.     folic acid 1 MG tablet  Commonly known as:  FOLVITE  Take 1 tablet (1 mg total) by mouth every evening.     gabapentin 100 MG capsule  Commonly known as:  NEURONTIN  Take 1 capsule (100 mg total) by mouth at bedtime.     insulin glargine 100 UNIT/ML injection  Commonly known as:  LANTUS  Inject 0.4 mLs (40 Units total) into the skin 2 (two) times daily.     Insulin Pen Needle 32G X 4 MM Misc  Commonly known as:  BD PEN NEEDLE NANO U/F  Use 4 times daily as directed.     methocarbamol 500 MG tablet  Commonly known as:  ROBAXIN  Take 1 tablet (500 mg total) by mouth every 6 (six) hours as needed for muscle spasms.     metoCLOPramide 10 MG tablet  Commonly known as:  REGLAN  Take 1 tablet (10 mg total) by mouth 3 (three) times daily.     ondansetron 4 MG tablet  Commonly  known as:  ZOFRAN  Take 1 tablet (4 mg total) by mouth every 6 (six) hours as needed for nausea.     Oxycodone HCl 20 MG Tabs  Take 1 tablet (20 mg total) by mouth every 3 (three) hours as needed (pain).     polyethylene glycol packet  Commonly known as:  MIRALAX / GLYCOLAX  Take 17 g by mouth daily as needed for mild constipation.     rifaximin 550 MG Tabs tablet  Commonly known as:  XIFAXAN  Take 550 mg by mouth 2 (two) times daily.     traMADol 50 MG tablet  Commonly known as:  ULTRAM  Take 1 tablet (50 mg total) by mouth every 12 (twelve) hours as needed for moderate pain.     traZODone 100 MG tablet  Commonly known as:  DESYREL  Take 2 tablets (200 mg total) by mouth at bedtime.       Allergies  Allergen Reactions  . Penicillins Anaphylaxis and Rash  . Codeine Phosphate Other (See Comments)    REACTION: Stomach cramps  . Hydrocodone-Acetaminophen Other (See Comments)    REACTION: hallucinations  . Morphine Other (See Comments)    REACTION: Lowers BP  . Other Other (See Comments)    AGENT:  Per pt, cannot take blood thinners due to cirrhosis of the liver  . Cephalexin Swelling and Rash   Follow-up Information   Please follow up. (f/u with MD at SNF)        The results of significant diagnostics from this hospitalization (including imaging, microbiology, ancillary and laboratory) are listed below for reference.    Significant Diagnostic Studies: Dg Chest 2 View  04/21/2013   CLINICAL DATA:  Fall.  Bilateral chest wall pain.  EXAM: CHEST  2 VIEW  COMPARISON:  To 03/2013  FINDINGS: Heart is mildly enlarged. No confluent airspace opacities in the lungs. Mild vascular congestion. No overt edema. No effusions or acute bony abnormality.  No visible rib fracture or pneumothorax.  IMPRESSION: Mild cardiomegaly, vascular congestion.   Electronically Signed   By: Rolm Baptise M.D.   On: 04/21/2013 17:58   Dg Pelvis 1-2 Views  04/21/2013   CLINICAL DATA:  Fall.  Pain.  EXAM:  PELVIS - 1-2 VIEW  COMPARISON:  None.  FINDINGS: No acute bony abnormality. Specifically, no fracture, subluxation, or dislocation. Soft tissues are intact. SI joints and hip joints are symmetric.  IMPRESSION: No acute bony  abnormality.   Electronically Signed   By: Rolm Baptise M.D.   On: 04/21/2013 18:04   Dg Knee 1-2 Views Left  04/22/2013   CLINICAL DATA:  ORIF of left patella fracture  EXAM: LEFT KNEE - 1-2 VIEW  COMPARISON:  DG KNEE COMPLETE 4 VIEWS*L* dated 04/21/2013  FINDINGS: 2 spot intraoperative fluoroscopic images of the left knee are provided for review.  Images demonstrate the sequela of a lag screw and cerclage wire fixation of previously noted displaced mid pole patellar fracture. Alignment appears improved with persistent minimal articular surface irregularity. Minimal amount of expected adjacent soft tissue swelling. No radiopaque foreign body.  IMPRESSION: Post ORIF of the patella without evidence of complication.   Electronically Signed   By: Sandi Mariscal M.D.   On: 04/22/2013 07:37   Ct Head Wo Contrast  04/21/2013   CLINICAL DATA:  Recent traumatic injury and pain  EXAM: CT HEAD WITHOUT CONTRAST  TECHNIQUE: Contiguous axial images were obtained from the base of the skull through the vertex without intravenous contrast.  COMPARISON:  None.  FINDINGS: The bony calvarium is intact. No gross soft tissue abnormality is noted. Mild atrophic changes are seen. No findings to suggest acute hemorrhage, acute infarction or space-occupying mass lesion are noted.  IMPRESSION: Chronic changes without acute abnormality.   Electronically Signed   By: Inez Catalina M.D.   On: 04/21/2013 18:11   Dg Knee Complete 4 Views Left  04/21/2013   CLINICAL DATA:  [place], pain.  EXAM: LEFT KNEE - COMPLETE 4+ VIEW  COMPARISON:  None.  FINDINGS: There is a comminuted transverse patellar fracture with displacement of the proximal and distal fragments. Relatively dense knee effusion likely representing hemarthrosis.  Distal femur and proximal tibia intact. Mild joint space narrowing.  IMPRESSION: Transverse comminuted patellar fracture.  Probable hemarthrosis.   Electronically Signed   By: Rolla Flatten M.D.   On: 04/21/2013 15:27   Dg Abd Acute W/chest  05/19/2013   CLINICAL DATA:  GI bleed with bloody output and colostomy bag.  EXAM: ACUTE ABDOMEN SERIES (ABDOMEN 2 VIEW & CHEST 1 VIEW)  COMPARISON:  DG PELVIS 1-2 VIEWS dated 04/21/2013; DG CHEST 2 VIEW dated 04/21/2013  FINDINGS: Shallow inspiration with elevation of left hemidiaphragm. Heart size and pulmonary vascularity are normal for technique. Interstitial fibrosis in the lungs. Postoperative changes in the mediastinum. No change since prior chest.  Surgical clips in the right upper quadrant. Stool-filled colon. No small or large bowel distention. No radiopaque stones identified. Degenerative changes in the spine.  IMPRESSION: Negative abdominal radiographs.  No acute cardiopulmonary disease.   Electronically Signed   By: Lucienne Capers M.D.   On: 05/19/2013 00:29   Dg C-arm 1-60 Min  05/07/2013   CLINICAL DATA:  ORIF of left patella fracture  EXAM: LEFT KNEE - 1-2 VIEW  COMPARISON:  DG KNEE COMPLETE 4 VIEWS*L* dated 04/21/2013  FINDINGS: 2 spot intraoperative fluoroscopic images of the left knee are provided for review.  Images demonstrate the sequela of a lag screw and cerclage wire fixation of previously noted displaced mid pole patellar fracture. Alignment appears improved with persistent minimal articular surface irregularity. Minimal amount of expected adjacent soft tissue swelling. No radiopaque foreign body.  IMPRESSION: Post ORIF of the patella without evidence of complication.   Electronically Signed   By: Sandi Mariscal M.D.   On: 05/07/2013 11:02    Microbiology: Recent Results (from the past 240 hour(s))  MRSA PCR SCREENING     Status:  None   Collection Time    05/19/13  4:09 AM      Result Value Ref Range Status   MRSA by PCR NEGATIVE  NEGATIVE  Final   Comment:            The GeneXpert MRSA Assay (FDA     approved for NASAL specimens     only), is one component of a     comprehensive MRSA colonization     surveillance program. It is not     intended to diagnose MRSA     infection nor to guide or     monitor treatment for     MRSA infections.     Labs: Basic Metabolic Panel:  Recent Labs Lab 05/18/13 2330 05/19/13 0423 05/20/13 0512 05/20/13 1354  NA 125* 127* 131* 133*  K 5.2 5.1 5.8* 4.9  CL 91* 93* 99 98  CO2 21 21 22 19   GLUCOSE 196* 151* 139* 247*  BUN 16 16 16 13   CREATININE 0.78 0.81 0.89 0.76  CALCIUM 9.6 9.4 9.0 9.4   Liver Function Tests:  Recent Labs Lab 05/18/13 2330 05/19/13 0423  AST 30 27  ALT 31 29  ALKPHOS 193* 178*  BILITOT 0.3 0.2*  PROT 6.9 6.7  ALBUMIN 2.5* 2.4*   No results found for this basename: LIPASE, AMYLASE,  in the last 168 hours No results found for this basename: AMMONIA,  in the last 168 hours CBC:  Recent Labs Lab 05/18/13 2330 05/19/13 0423 05/19/13 0803 05/19/13 1558 05/20/13 0512  WBC 13.0* 13.3* 14.4* 13.3* 13.3*  HGB 9.8* 9.6* 9.9* 9.6* 9.8*  HCT 29.9* 30.5* 30.8* 29.5* 30.8*  MCV 81.5 82.7 82.4 81.0 81.7  PLT 780* 739* 819* 867* 840*   Cardiac Enzymes: No results found for this basename: CKTOTAL, CKMB, CKMBINDEX, TROPONINI,  in the last 168 hours BNP: BNP (last 3 results)  Recent Labs  11/13/12 1334  PROBNP 67.0   CBG:  Recent Labs Lab 05/19/13 1143 05/19/13 1700 05/19/13 2301 05/20/13 0735 05/20/13 1154  GLUCAP 123* 130* 193* 137* 168*       Signed:  Eugenie Filler MD [facility] 05/20/2013, 3:23 PM

## 2013-05-20 NOTE — Evaluation (Signed)
Physical Therapy Evaluation Patient Details Name: [name] MRN: [id number] DOB: December 18, 1950 Today's Date: 05/20/2013   History of Present Illness  Pt is a 63 year old female admitted from [facility] where she was receiving rehab following L ORIF for patellar fracture due to fall; pt admitted for GI bleed after noticing blood in colostomy bag; PMH of stage IB non-small cell lung cancer, cirrhosis of liver, diabetes mellitus, COPD   Clinical Impression  Pt admitted with GI bleed from SNF, ORIF for patellar fracture 3/36/15.  Pt currently with functional deficits listed below (see PT Problem List).  Needs assist with management of L LE for bed mobility.  Pt ambulated in hallway reporting SOB, reapplied 2L O2 [place] upon entering room.  Pt hopes to discharge to SNF tomorrow to continue rehab.  Pt will benefit from skilled PT to increase independence and safety with mobility to allow discharge.    Follow Up Recommendations SNF    Equipment Recommendations  None recommended by PT    Recommendations for Other Services       Precautions / Restrictions Precautions Precautions: Fall Required Braces or Orthoses: Knee Immobilizer - Left Knee Immobilizer - Left: On at all times Restrictions Weight Bearing Restrictions: Yes LLE Weight Bearing: Weight bearing as tolerated Other Position/Activity Restrictions: No ROM L knee; quad sets and ankle pumps OK      Mobility  Bed Mobility Overal bed mobility: Needs Assistance Bed Mobility: Supine to Sit     Supine to sit: Min assist;HOB elevated     General bed mobility comments: min assist for L LE managment  Transfers Overall transfer level: Needs assistance Equipment used: Rolling walker (2 wheeled) Transfers: Sit to/from Stand Sit to Stand: Min guard         General transfer comment: verbal cues for safety and sequence, pt demonstrated understanding of LE placement to stand with L knee  immobilizer  Ambulation/Gait Ambulation/Gait assistance: Min guard Ambulation Distance (Feet): 200 Feet Assistive device: Rolling walker (2 wheeled) Gait Pattern/deviations: Step-to pattern;Antalgic;Decreased stride length Gait velocity: decreased   General Gait Details: verbal cues for safety; pt ambulated on room air (as she does at her rehab facility), became SOB toward end of ambulation, 2LO2 [place] reapplied upon entering room  Stairs            Wheelchair Mobility    Modified Rankin (Stroke Patients Only)       Balance                                             Pertinent Vitals/Pain Pt premedicated.  Activity to tolerance.    Home Living Family/patient expects to be discharged to:: Skilled nursing facility                      Prior Function Level of Independence: Independent with assistive device(s)               Hand Dominance        Extremity/Trunk Assessment   Upper Extremity Assessment: Overall WFL for tasks assessed           Lower Extremity Assessment: LLE deficits/detail   LLE Deficits / Details: LLE immobilizer, ROM deferred according to orders, unable to perform SLR  Cervical / Trunk Assessment: Normal  Communication   Communication: No difficulties  Cognition Arousal/Alertness: Awake/alert Behavior During  Therapy: WFL for tasks assessed/performed Overall Cognitive Status: Within Functional Limits for tasks assessed                      General Comments      Exercises        Assessment/Plan    PT Assessment Patient needs continued PT services  PT Diagnosis Difficulty walking   PT Problem List Decreased strength;Decreased range of motion;Decreased activity tolerance;Decreased balance;Decreased mobility;Decreased knowledge of use of DME;Decreased safety awareness  PT Treatment Interventions DME instruction;Gait training;Functional mobility training;Therapeutic activities;Therapeutic  exercise;Patient/family education;Balance training   PT Goals (Current goals can be found in the Care Plan section) Acute Rehab PT Goals Patient Stated Goal: return to rehab facility PT Goal Formulation: With patient Time For Goal Achievement: 05/27/13 Potential to Achieve Goals: Good    Frequency Min 3X/week   Barriers to discharge        Co-evaluation               End of Session   Activity Tolerance: Patient tolerated treatment well Patient left: in chair;with call bell/phone within reach           Time: 1007-1020 PT Time Calculation (min): 13 min   Charges:   PT Evaluation $Initial PT Evaluation Tier I: 1 Procedure PT Treatments $Gait Training: 8-22 mins   PT G CodesJacqulyn Cane 05/30/2013, 12:02 PM Jacqulyn Cane SPT May 30, 2013

## 2013-05-20 NOTE — Progress Notes (Signed)
Clinical Social Work  Patient was discussed during progression meeting and MD reports possible DC this afternoon. CSW updated SNF who is agreeable to accept patient when stable. CSW will continue to follow.  [place], [place] [phone]

## 2013-05-20 NOTE — Clinical Documentation Improvement (Signed)
Possible Clinical Conditions?     Hyponatremia                               Other Condition___________________                 Cannot Clinically Determine_________   Supporting Information:  Per 05/19/13 consult note: Pt's sodium level range 125 - 131 (4/21/5 - 05/20/13)    Thank You, Serena Colonel ,RN Clinical Documentation Specialist:  [facility]

## 2013-05-20 NOTE — Evaluation (Signed)
I have reviewed this note and agree with all findings. Kati Laurali Goddard, PT, DPT Pager: [phone]   

## 2013-05-20 NOTE — Progress Notes (Signed)
Clinical Social Work  CSW faxed DC summary to [facility] who is agreeable to accept today. CSW prepared DC packet with FL2 and hard scripts included. CSW informed patient, RN, and MD of DC plans and all parties agreeable. Patient reports she will inform family of DC plans and prefers [facility] to provide transportation. [facility] Request #: [id number].  CSW is signing off but available if needed.  [place], [place] [phone]

## 2013-05-21 LAB — URINE CULTURE
Colony Count: NO GROWTH
Culture: NO GROWTH

## 2013-05-25 ENCOUNTER — Ambulatory Visit: Payer: Medicare Other

## 2013-06-14 ENCOUNTER — Telehealth: Payer: Self-pay | Admitting: Family Medicine

## 2013-06-14 NOTE — Telephone Encounter (Signed)
Per [name], okay to approve the below request. I did speak with Mickel Baas and faxed over order. She said that they already had the Aquacel and no need to call that in. I have a verbal for the PT request.

## 2013-06-14 NOTE — Telephone Encounter (Signed)
This was taken care of

## 2013-06-14 NOTE — Telephone Encounter (Signed)
Pt stated she needs [facility] oxygen [facility]. Pt is on oxygen 24/7. Please contact [facility] [phone]

## 2013-06-14 NOTE — Telephone Encounter (Signed)
Fax to [facility] [phone] order for portable O2 tank, 3 in one commode seat, PT 2 times a week for 8 weeks, also pt has wound on left knee and needs a script for Aquacel sent to [facility] on [address]. Call Mickel Baas back if this is okay [phone]. ( pt recently fractured knee)

## 2013-06-14 NOTE — Telephone Encounter (Signed)
Please okay this.

## 2013-06-16 ENCOUNTER — Telehealth: Payer: Self-pay | Admitting: Family Medicine

## 2013-06-16 ENCOUNTER — Other Ambulatory Visit: Payer: Self-pay | Admitting: *Deleted

## 2013-06-16 NOTE — Telephone Encounter (Signed)
[facility] is needing verbal order for occupational therapy once a week for 4 weeks, and an rx faxed for a tub transfer bench. Fax# [phone].

## 2013-06-16 NOTE — Telephone Encounter (Signed)
The rx for the bench is ready, and please okay the OT orders

## 2013-06-16 NOTE — Telephone Encounter (Signed)
I left a voice message with verbal order for the OT and faxed the script.

## 2013-06-18 ENCOUNTER — Telehealth: Payer: Self-pay | Admitting: Internal Medicine

## 2013-06-18 NOTE — Telephone Encounter (Signed)
pt called to r/s appt ..done...pt aware of new time

## 2013-06-22 ENCOUNTER — Ambulatory Visit: Payer: Medicare Other | Admitting: Internal Medicine

## 2013-06-22 ENCOUNTER — Other Ambulatory Visit: Payer: Medicare Other

## 2013-06-22 ENCOUNTER — Ambulatory Visit: Payer: Medicare Other

## 2013-06-22 DIAGNOSIS — R269 Unspecified abnormalities of gait and mobility: Secondary | ICD-10-CM

## 2013-06-22 DIAGNOSIS — Z4789 Encounter for other orthopedic aftercare: Secondary | ICD-10-CM

## 2013-06-22 DIAGNOSIS — T8189XA Other complications of procedures, not elsewhere classified, initial encounter: Secondary | ICD-10-CM

## 2013-06-22 DIAGNOSIS — E119 Type 2 diabetes mellitus without complications: Secondary | ICD-10-CM

## 2013-06-22 DIAGNOSIS — J449 Chronic obstructive pulmonary disease, unspecified: Secondary | ICD-10-CM

## 2013-06-23 ENCOUNTER — Ambulatory Visit (HOSPITAL_BASED_OUTPATIENT_CLINIC_OR_DEPARTMENT_OTHER): Payer: Medicare Other | Admitting: Internal Medicine

## 2013-06-23 ENCOUNTER — Other Ambulatory Visit: Payer: Medicare Other

## 2013-06-23 ENCOUNTER — Encounter: Payer: Self-pay | Admitting: Family Medicine

## 2013-06-23 ENCOUNTER — Ambulatory Visit (INDEPENDENT_AMBULATORY_CARE_PROVIDER_SITE_OTHER): Payer: Medicare Other | Admitting: Family Medicine

## 2013-06-23 ENCOUNTER — Encounter: Payer: Self-pay | Admitting: Internal Medicine

## 2013-06-23 VITALS — BP 193/45 | HR 65 | Temp 98.7°F | Resp 18 | Ht 60.0 in | Wt 199.7 lb

## 2013-06-23 VITALS — BP 150/70 | HR 60 | Temp 99.6°F | Ht 60.0 in

## 2013-06-23 DIAGNOSIS — K7460 Unspecified cirrhosis of liver: Secondary | ICD-10-CM

## 2013-06-23 DIAGNOSIS — C3490 Malignant neoplasm of unspecified part of unspecified bronchus or lung: Secondary | ICD-10-CM

## 2013-06-23 DIAGNOSIS — D696 Thrombocytopenia, unspecified: Secondary | ICD-10-CM

## 2013-06-23 DIAGNOSIS — I10 Essential (primary) hypertension: Secondary | ICD-10-CM

## 2013-06-23 DIAGNOSIS — D539 Nutritional anemia, unspecified: Secondary | ICD-10-CM

## 2013-06-23 DIAGNOSIS — J449 Chronic obstructive pulmonary disease, unspecified: Secondary | ICD-10-CM

## 2013-06-23 DIAGNOSIS — D649 Anemia, unspecified: Secondary | ICD-10-CM

## 2013-06-23 DIAGNOSIS — E875 Hyperkalemia: Secondary | ICD-10-CM

## 2013-06-23 DIAGNOSIS — K922 Gastrointestinal hemorrhage, unspecified: Secondary | ICD-10-CM

## 2013-06-23 DIAGNOSIS — S82009A Unspecified fracture of unspecified patella, initial encounter for closed fracture: Secondary | ICD-10-CM

## 2013-06-23 DIAGNOSIS — E1149 Type 2 diabetes mellitus with other diabetic neurological complication: Secondary | ICD-10-CM

## 2013-06-23 DIAGNOSIS — Z85118 Personal history of other malignant neoplasm of bronchus and lung: Secondary | ICD-10-CM

## 2013-06-23 LAB — CBC WITH DIFFERENTIAL/PLATELET
BASO%: 1.3 % (ref 0.0–2.0)
BASOS ABS: 0.2 10*3/uL — AB (ref 0.0–0.1)
EOS%: 5.3 % (ref 0.0–7.0)
Eosinophils Absolute: 0.8 10*3/uL — ABNORMAL HIGH (ref 0.0–0.5)
HCT: 32.8 % — ABNORMAL LOW (ref 34.8–46.6)
HGB: 10.2 g/dL — ABNORMAL LOW (ref 11.6–15.9)
LYMPH%: 10.7 % — ABNORMAL LOW (ref 14.0–49.7)
MCH: 25.6 pg (ref 25.1–34.0)
MCHC: 31 g/dL — ABNORMAL LOW (ref 31.5–36.0)
MCV: 82.5 fL (ref 79.5–101.0)
MONO#: 2.2 10*3/uL — ABNORMAL HIGH (ref 0.1–0.9)
MONO%: 13.7 % (ref 0.0–14.0)
NEUT%: 69 % (ref 38.4–76.8)
NEUTROS ABS: 10.9 10*3/uL — AB (ref 1.5–6.5)
Platelets: 440 10*3/uL — ABNORMAL HIGH (ref 145–400)
RBC: 3.97 10*6/uL (ref 3.70–5.45)
RDW: 16.9 % — ABNORMAL HIGH (ref 11.2–14.5)
WBC: 15.9 10*3/uL — ABNORMAL HIGH (ref 3.9–10.3)
lymph#: 1.7 10*3/uL (ref 0.9–3.3)

## 2013-06-23 LAB — COMPREHENSIVE METABOLIC PANEL (CC13)
ALBUMIN: 3.2 g/dL — AB (ref 3.5–5.0)
ALT: 49 U/L (ref 0–55)
AST: 43 U/L — AB (ref 5–34)
Alkaline Phosphatase: 138 U/L (ref 40–150)
Anion Gap: 12 mEq/L — ABNORMAL HIGH (ref 3–11)
BUN: 21.7 mg/dL (ref 7.0–26.0)
CO2: 21 mEq/L — ABNORMAL LOW (ref 22–29)
Calcium: 10.3 mg/dL (ref 8.4–10.4)
Chloride: 105 mEq/L (ref 98–109)
Creatinine: 1.1 mg/dL (ref 0.6–1.1)
GLUCOSE: 223 mg/dL — AB (ref 70–140)
Potassium: 4.7 mEq/L (ref 3.5–5.1)
SODIUM: 138 meq/L (ref 136–145)
TOTAL PROTEIN: 7.2 g/dL (ref 6.4–8.3)
Total Bilirubin: 0.31 mg/dL (ref 0.20–1.20)

## 2013-06-23 LAB — LACTATE DEHYDROGENASE (CC13): LDH: 167 U/L (ref 125–245)

## 2013-06-23 LAB — TECHNOLOGIST REVIEW

## 2013-06-23 MED ORDER — FUROSEMIDE 40 MG PO TABS: 40.0000 mg | ORAL_TABLET | Freq: Every day | ORAL | Status: DC

## 2013-06-23 NOTE — Progress Notes (Signed)
   Subjective:    Patient ID: [name], female    DOB: 06/10/1950, 63 y.o.   MRN: [id number]  HPI Here to follow up a hospital stay from 05-18-13 to 05-20-13, and then a rehab stay until 06-09-13. This started off with a GI bleed, probably diverticular. Once this was resolved she went to rehab to work on pulmonary function and to recover from her patellar fracture. She has done fairly well since then. She walks with a walker. She is wearing continuous oxygen at 2 liters. She has had some swelling in the lower lags and feet and she is SOB. Using Lasix 20 mg daily. She saw Dr. Julien Nordmann this morning to follow up on lung cancer and he was satisfied. Her labs were stable with a Hgb of 10.2, a creatinine of 1.1, and a K of 4.7. She has seen no further blood in her ostomy bag.    Review of Systems  Constitutional: Negative.   Respiratory: Positive for shortness of breath. Negative for cough, chest tightness and wheezing.   Cardiovascular: Positive for leg swelling. Negative for chest pain and palpitations.  Gastrointestinal: Negative.        Objective:   Physical Exam  Constitutional:  Frail but alert, walking with a knee immobilizer and her walker   Cardiovascular: Normal rate, regular rhythm, normal heart sounds and intact distal pulses.   Pulmonary/Chest: Effort normal. No respiratory distress. She has no rales.  Scattered wheezes   Musculoskeletal:  2+ edema to both lower legs           Assessment & Plan:  Increase Lasix to 40 mg daily. She will follow up with Endocrine, GI, and Orthopedics in the next week.

## 2013-06-23 NOTE — Progress Notes (Signed)
Pre visit review using our clinic review tool, if applicable. No additional management support is needed unless otherwise documented below in the visit note. 

## 2013-06-23 NOTE — Progress Notes (Signed)
[place] [phone]   [phone]  OFFICE PROGRESS NOTE  [name], MD [place] [address]  DIAGNOSIS:  1) stage IB non-small cell lung cancer diagnosed in April 2004 status post left upper lobectomy by Dr. Roxan Hockey.  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  3) anemia of chronic disease plus/minus deficiency secondary to GI bleed.  4) hypercalcemia most likely secondary to excessive vitamin D intake.  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 Ninfa Linden.  CURRENT THERAPY:  Observation.  INTERVAL HISTORY: [name] 63 y.o. female returns to the clinic today for followup visit accompanied by her daughter. She continues to complain of persistent fatigue and bleeding from the colostomy bag.  She was admitted to [facility] on 05/18/2013 with gastrointestinal bleed. She was placed on Protonix drip and felt much better. She also had fracture of her left patella and underwent surgical intervention and currently doing physical therapy at home. She continues to have increasing fatigue. She also has intermittent bleeding from the ostomy. She had intolerance to oral iron. She denied having any other significant complaints except for the fatigue. She denied and specifically no nausea or vomiting, no fever or chills. She denied having any significant weight loss or night sweats. She has no chest pain but continues to have shortness of breath with exertion. She has no other bleeding, bruises or ecchymosis. She had repeat CBC and comprehensive metabolic panel performed earlier today and she is here for evaluation and discussion of her lab results.   MEDICAL HISTORY: Past Medical History  Diagnosis Date  . Cirrhosis   . GERD (gastroesophageal reflux disease)   . Cervical disc syndrome   . Lung cancer     squamous cell  . Chronic respiratory failure   .  Diverticulitis of colon   . Perforation of colon   . Arthritis   . IBS (irritable bowel syndrome)   . Chronic lower GI bleeding   . Overactive bladder   . Cervical cancer   . Thrombocytopenia     sees Dr. Julien Nordmann   . Splenomegaly   . Anxiety   . Depression   . Hypertension   . Asthma   . Chronic headache disorder   . Blood transfusion without reported diagnosis   . Heart murmur   . Hyperlipidemia   . Lung cancer   . COPD (chronic obstructive pulmonary disease)     sees Dr. Gwenette Greet   . Anemia     sees Dr. Julien Nordmann, due to chronic disease and GI losses   . Diabetes mellitus     sees Dr. Cruzita Lederer   . Colostomy care   . Hypercalcemia     ALLERGIES:  is allergic to penicillins; acetaminophen; codeine phosphate; hydrocodone-acetaminophen; hydrocodone-acetaminophen; morphine; other; and cephalexin.  MEDICATIONS:  Current Outpatient Prescriptions  Medication Sig Dispense Refill  . albuterol-ipratropium (COMBIVENT) 18-103 MCG/ACT inhaler Inhale 2 puffs into the lungs every 4 (four) hours as needed for wheezing or shortness of breath.       [name] aspirin 325 MG tablet Take 1 tablet (325 mg total) by mouth daily.  30 tablet  0  . diazepam (VALIUM) 5 MG tablet Take 1 tablet (5 mg total) by mouth 3 (three) times daily as needed for anxiety.  10 tablet  0  . dicyclomine (BENTYL) 10 MG capsule Take 10 mg by mouth 4 (four) times daily -  before meals  and at bedtime.      [name] esomeprazole (NEXIUM) 40 MG capsule Take 40 mg by mouth every evening.      . fentaNYL (DURAGESIC - DOSED MCG/HR) 25 MCG/HR patch Place 1 patch (25 mcg total) onto the skin every 3 (three) days.  5 patch  0  . Fluticasone-Salmeterol (ADVAIR) 250-50 MCG/DOSE AEPB Inhale 1 puff into the lungs every 12 (twelve) hours.  884 each  3  . folic acid (FOLVITE) 1 MG tablet Take 1 tablet (1 mg total) by mouth every evening.  90 tablet  3  . gabapentin (NEURONTIN) 100 MG capsule Take 1 capsule (100 mg total) by mouth at bedtime.      .  insulin glargine (LANTUS) 100 UNIT/ML injection Inject 0.4 mLs (40 Units total) into the skin 2 (two) times daily.  30 mL  12  . Insulin Pen Needle (BD PEN NEEDLE NANO U/F) 32G X 4 MM MISC Use 4 times daily as directed.  150 each  3  . methocarbamol (ROBAXIN) 500 MG tablet Take 1 tablet (500 mg total) by mouth every 6 (six) hours as needed for muscle spasms.  60 tablet  0  . metoCLOPramide (REGLAN) 10 MG tablet Take 1 tablet (10 mg total) by mouth 3 (three) times daily.  270 tablet  3  . ondansetron (ZOFRAN) 4 MG tablet Take 1 tablet (4 mg total) by mouth every 6 (six) hours as needed for nausea.  20 tablet  0  . Oxycodone HCl 20 MG TABS Take 1 tablet (20 mg total) by mouth every 3 (three) hours as needed (pain).  15 tablet  0  . polyethylene glycol (MIRALAX / GLYCOLAX) packet Take 17 g by mouth daily as needed for mild constipation.  14 each  0  . rifaximin (XIFAXAN) 550 MG TABS tablet Take 550 mg by mouth 2 (two) times daily.      . traMADol (ULTRAM) 50 MG tablet Take 1 tablet (50 mg total) by mouth every 12 (twelve) hours as needed for moderate pain.  10 tablet  0  . traZODone (DESYREL) 100 MG tablet Take 2 tablets (200 mg total) by mouth at bedtime.  14 tablet  0   No current facility-administered medications for this visit.    SURGICAL HISTORY:  Past Surgical History  Procedure Laterality Date  . Appendectomy    . Cholecystectomy    . Colostomy    . Pneumonectomy    . Cervix removed    . Bowel resection    . Esophagogastroduodenoscopy  03/19/2011    Procedure: ESOPHAGOGASTRODUODENOSCOPY (EGD);  Surgeon: [name], MD;  Location: [facility];  Service: Endoscopy;  Laterality: N/A;  . Colonoscopy  03/19/2011    Procedure: COLONOSCOPY;  Surgeon: [name], MD;  Location: [facility];  Service: Endoscopy;  Laterality: N/A;  . Givens capsule study  03/20/2011    Procedure: GIVENS CAPSULE STUDY;  Surgeon: [name], MD;  Location: [facility];  Service: Endoscopy;   Laterality: N/A;  . Small bowel obstruction repair  March 2012  . Umbilical hernia repair  March 2012  . Splenectomy, total N/A 02/24/2013    Procedure: SPLENECTOMY;  Surgeon: Harl Bowie, MD;  Location: [facility] [facility];  Service: General;  Laterality: N/A;  . Orif patella fracture Left 04/21/2013    DR [name]  . Orif patella Left 04/21/2013    Procedure: OPEN REDUCTION INTERNAL (ORIF) FIXATION PATELLA;  Surgeon: Augustin Schooling, MD;  Location: [place];  Service: Orthopedics;  Laterality: Left;    REVIEW OF SYSTEMS:  Constitutional: positive for fatigue Eyes: negative Ears, nose, mouth, throat, and face: negative Respiratory: positive for dyspnea on exertion Cardiovascular: negative Gastrointestinal: positive for Bleeding from the ostomy site. Genitourinary:negative Integument/breast: negative Hematologic/lymphatic: Anemia and low platelets Musculoskeletal:negative Neurological: negative Behavioral/Psych: negative Allergic/Immunologic: negative   PHYSICAL EXAMINATION: General appearance: alert, cooperative, fatigued and no distress Head: Normocephalic, without obvious abnormality, atraumatic Neck: no adenopathy and no JVD Lymph nodes: Cervical, supraclavicular, and axillary nodes normal. Resp: clear to auscultation bilaterally Cardio: regular rate and rhythm, S1, S2 normal, no murmur, click, rub or gallop GI: soft, non-tender; bowel sounds normal; no masses,  no organomegaly Extremities: extremities normal, atraumatic, no cyanosis or edema Neurologic: Alert and oriented X 3, normal strength and tone. Normal symmetric reflexes. Normal coordination and gait  ECOG PERFORMANCE STATUS: 2 - Symptomatic, <50% confined to bed  Blood pressure 193/45, pulse 65, temperature 98.7 F (37.1 C), temperature source Oral, resp. rate 18, height 5' (1.524 m), weight 199 lb 11.2 oz (90.583 kg), SpO2 96.00%.  LABORATORY DATA: Lab Results  Component Value Date   WBC 15.9* 06/23/2013   HGB 10.2* 06/23/2013    HCT 32.8* 06/23/2013   MCV 82.5 06/23/2013   PLT 440* 06/23/2013      Chemistry      Component Value Date/Time   NA 138 06/23/2013 0806   NA 133* 05/20/2013 1354   K 4.7 06/23/2013 0806   K 4.9 05/20/2013 1354   CL 98 05/20/2013 1354   CL 103 03/23/2012 0938   CO2 21* 06/23/2013 0806   CO2 19 05/20/2013 1354   BUN 21.7 06/23/2013 0806   BUN 13 05/20/2013 1354   CREATININE 1.1 06/23/2013 0806   CREATININE 0.76 05/20/2013 1354      Component Value Date/Time   CALCIUM 10.3 06/23/2013 0806   CALCIUM 9.4 05/20/2013 1354   ALKPHOS 138 06/23/2013 0806   ALKPHOS 178* 05/19/2013 0423   AST 43* 06/23/2013 0806   AST 27 05/19/2013 0423   ALT 49 06/23/2013 0806   ALT 29 05/19/2013 0423   BILITOT 0.31 06/23/2013 0806   BILITOT 0.2* 05/19/2013 0423       RADIOGRAPHIC STUDIES: BONE MARROW REPORT ASSESSMENT AND PLAN:  1) history of anemia and thrombocytopenia: Her thrombocytopenia has Completely resolved after the splenectomy. She continues to have mild anemia secondary to iron deficiency from occasional bleeding from the ostomy. I recommended for her oral iron tablets but the patient has intolerance. I advised her to increase her dietary intake of iron. I will repeat CBC as well as iron study and ferritin in 3 months for reevaluation of her anemia. 2) history of stage IB non-small cell lung cancer diagnosed in 2014. Continue on observation. She would come back for followup visit in 3 months for reevaluation. She was advised to call immediately if she has any concerning symptoms in the interval. The patient voices understanding of current disease status and treatment options and is in agreement with the current care plan.  All questions were answered. The patient knows to call the clinic with any problems, questions or concerns. We can certainly see the patient much sooner if necessary.  Disclaimer: This note was dictated with voice recognition software. Similar sounding words can inadvertently be  transcribed and may not be corrected upon review.

## 2013-06-24 ENCOUNTER — Telehealth: Payer: Self-pay | Admitting: Pulmonary Disease

## 2013-06-24 ENCOUNTER — Telehealth: Payer: Self-pay | Admitting: Family Medicine

## 2013-06-24 NOTE — Telephone Encounter (Signed)
She would need to ask her Pulmonary doctor, Dr. Gwenette Greet for this

## 2013-06-24 NOTE — Telephone Encounter (Signed)
I spoke with pt and she will call to schedule with the below doctor.

## 2013-06-24 NOTE — Telephone Encounter (Signed)
Pt is needing an order for a conserving regulator, because her oxygen is not lasting. Please send order to [facility] fax # [phone].

## 2013-06-24 NOTE — Telephone Encounter (Signed)
[id number], pt needs to keep the appt. Nothing can be done before then because insurance will require qualifying sats, OV note, etc. Tightwad Bing, CMA

## 2013-06-25 ENCOUNTER — Telehealth: Payer: Self-pay | Admitting: Internal Medicine

## 2013-06-25 ENCOUNTER — Encounter: Payer: Medicare Other | Attending: Physical Medicine & Rehabilitation | Admitting: Physical Medicine & Rehabilitation

## 2013-06-25 NOTE — Telephone Encounter (Signed)
S/w pt re appts for 8/20 and 8/27

## 2013-06-25 NOTE — Telephone Encounter (Signed)
Pt scheduled for appt 6/23--requested sooner appt Appt moved to 6/19 at 2:45 with [place].  Nothing further needed.

## 2013-07-12 ENCOUNTER — Encounter (HOSPITAL_COMMUNITY): Payer: Self-pay | Admitting: Emergency Medicine

## 2013-07-12 ENCOUNTER — Emergency Department (HOSPITAL_COMMUNITY): Payer: Medicare Other

## 2013-07-12 DIAGNOSIS — Z9089 Acquired absence of other organs: Secondary | ICD-10-CM

## 2013-07-12 DIAGNOSIS — Z85118 Personal history of other malignant neoplasm of bronchus and lung: Secondary | ICD-10-CM

## 2013-07-12 DIAGNOSIS — I200 Unstable angina: Secondary | ICD-10-CM | POA: Diagnosis present

## 2013-07-12 DIAGNOSIS — J4489 Other specified chronic obstructive pulmonary disease: Secondary | ICD-10-CM | POA: Diagnosis present

## 2013-07-12 DIAGNOSIS — F329 Major depressive disorder, single episode, unspecified: Secondary | ICD-10-CM | POA: Diagnosis present

## 2013-07-12 DIAGNOSIS — Z8249 Family history of ischemic heart disease and other diseases of the circulatory system: Secondary | ICD-10-CM

## 2013-07-12 DIAGNOSIS — Z9981 Dependence on supplemental oxygen: Secondary | ICD-10-CM

## 2013-07-12 DIAGNOSIS — D696 Thrombocytopenia, unspecified: Secondary | ICD-10-CM | POA: Diagnosis present

## 2013-07-12 DIAGNOSIS — E785 Hyperlipidemia, unspecified: Secondary | ICD-10-CM | POA: Diagnosis present

## 2013-07-12 DIAGNOSIS — Z888 Allergy status to other drugs, medicaments and biological substances status: Secondary | ICD-10-CM

## 2013-07-12 DIAGNOSIS — J9620 Acute and chronic respiratory failure, unspecified whether with hypoxia or hypercapnia: Secondary | ICD-10-CM | POA: Diagnosis present

## 2013-07-12 DIAGNOSIS — I5033 Acute on chronic diastolic (congestive) heart failure: Principal | ICD-10-CM | POA: Diagnosis present

## 2013-07-12 DIAGNOSIS — K7460 Unspecified cirrhosis of liver: Secondary | ICD-10-CM | POA: Diagnosis present

## 2013-07-12 DIAGNOSIS — Z833 Family history of diabetes mellitus: Secondary | ICD-10-CM

## 2013-07-12 DIAGNOSIS — I4589 Other specified conduction disorders: Secondary | ICD-10-CM | POA: Diagnosis present

## 2013-07-12 DIAGNOSIS — Z885 Allergy status to narcotic agent status: Secondary | ICD-10-CM

## 2013-07-12 DIAGNOSIS — E1169 Type 2 diabetes mellitus with other specified complication: Secondary | ICD-10-CM

## 2013-07-12 DIAGNOSIS — IMO0002 Reserved for concepts with insufficient information to code with codable children: Secondary | ICD-10-CM | POA: Diagnosis present

## 2013-07-12 DIAGNOSIS — Z8541 Personal history of malignant neoplasm of cervix uteri: Secondary | ICD-10-CM

## 2013-07-12 DIAGNOSIS — G8929 Other chronic pain: Secondary | ICD-10-CM | POA: Diagnosis present

## 2013-07-12 DIAGNOSIS — I4510 Unspecified right bundle-branch block: Secondary | ICD-10-CM | POA: Diagnosis present

## 2013-07-12 DIAGNOSIS — Z794 Long term (current) use of insulin: Secondary | ICD-10-CM

## 2013-07-12 DIAGNOSIS — I10 Essential (primary) hypertension: Secondary | ICD-10-CM | POA: Diagnosis present

## 2013-07-12 DIAGNOSIS — R0602 Shortness of breath: Secondary | ICD-10-CM | POA: Diagnosis not present

## 2013-07-12 DIAGNOSIS — Z87891 Personal history of nicotine dependence: Secondary | ICD-10-CM

## 2013-07-12 DIAGNOSIS — F411 Generalized anxiety disorder: Secondary | ICD-10-CM | POA: Diagnosis present

## 2013-07-12 DIAGNOSIS — I509 Heart failure, unspecified: Secondary | ICD-10-CM | POA: Diagnosis present

## 2013-07-12 DIAGNOSIS — Z933 Colostomy status: Secondary | ICD-10-CM

## 2013-07-12 DIAGNOSIS — I4430 Unspecified atrioventricular block: Secondary | ICD-10-CM | POA: Diagnosis present

## 2013-07-12 DIAGNOSIS — F3289 Other specified depressive episodes: Secondary | ICD-10-CM | POA: Diagnosis present

## 2013-07-12 DIAGNOSIS — Z880 Allergy status to penicillin: Secondary | ICD-10-CM

## 2013-07-12 DIAGNOSIS — Z6836 Body mass index (BMI) 36.0-36.9, adult: Secondary | ICD-10-CM

## 2013-07-12 DIAGNOSIS — D500 Iron deficiency anemia secondary to blood loss (chronic): Secondary | ICD-10-CM | POA: Diagnosis present

## 2013-07-12 DIAGNOSIS — E1149 Type 2 diabetes mellitus with other diabetic neurological complication: Secondary | ICD-10-CM | POA: Diagnosis present

## 2013-07-12 DIAGNOSIS — E1165 Type 2 diabetes mellitus with hyperglycemia: Secondary | ICD-10-CM | POA: Diagnosis present

## 2013-07-12 DIAGNOSIS — J449 Chronic obstructive pulmonary disease, unspecified: Secondary | ICD-10-CM | POA: Diagnosis present

## 2013-07-12 LAB — COMPREHENSIVE METABOLIC PANEL
ALBUMIN: 3.3 g/dL — AB (ref 3.5–5.2)
ALK PHOS: 107 U/L (ref 39–117)
ALT: 17 U/L (ref 0–35)
AST: 22 U/L (ref 0–37)
BUN: 36 mg/dL — ABNORMAL HIGH (ref 6–23)
CHLORIDE: 99 meq/L (ref 96–112)
CO2: 23 mEq/L (ref 19–32)
Calcium: 10.7 mg/dL — ABNORMAL HIGH (ref 8.4–10.5)
Creatinine, Ser: 0.97 mg/dL (ref 0.50–1.10)
GFR calc Af Amer: 72 mL/min — ABNORMAL LOW (ref >90)
GFR calc non Af Amer: 62 mL/min — ABNORMAL LOW (ref >90)
Glucose, Bld: 99 mg/dL (ref 70–99)
POTASSIUM: 4.5 meq/L (ref 3.7–5.3)
SODIUM: 138 meq/L (ref 137–147)
Total Bilirubin: <0.2 mg/dL — ABNORMAL LOW (ref 0.3–1.2)
Total Protein: 7.7 g/dL (ref 6.0–8.3)

## 2013-07-12 LAB — I-STAT TROPONIN, ED: Troponin i, poc: 0.04 ng/mL (ref 0.00–0.08)

## 2013-07-12 LAB — CBC WITH DIFFERENTIAL/PLATELET
Basophils Absolute: 0.1 10*3/uL (ref 0.0–0.1)
Basophils Relative: 1 % (ref 0–1)
EOS ABS: 0.3 10*3/uL (ref 0.0–0.7)
Eosinophils Relative: 2 % (ref 0–5)
HCT: 28 % — ABNORMAL LOW (ref 36.0–46.0)
Hemoglobin: 8.8 g/dL — ABNORMAL LOW (ref 12.0–15.0)
Lymphocytes Relative: 22 % (ref 12–46)
Lymphs Abs: 3.1 10*3/uL (ref 0.7–4.0)
MCH: 25.7 pg — ABNORMAL LOW (ref 26.0–34.0)
MCHC: 31.4 g/dL (ref 30.0–36.0)
MCV: 81.9 fL (ref 78.0–100.0)
MONOS PCT: 20 % — AB (ref 3–12)
Monocytes Absolute: 2.8 10*3/uL — ABNORMAL HIGH (ref 0.1–1.0)
NEUTROS PCT: 55 % (ref 43–77)
Neutro Abs: 7.6 10*3/uL (ref 1.7–7.7)
Platelets: 471 10*3/uL — ABNORMAL HIGH (ref 150–400)
RBC: 3.42 MIL/uL — ABNORMAL LOW (ref 3.87–5.11)
RDW: 16.9 % — AB (ref 11.5–15.5)
WBC: 13.9 10*3/uL — AB (ref 4.0–10.5)

## 2013-07-12 LAB — PRO B NATRIURETIC PEPTIDE: PRO B NATRI PEPTIDE: 4136 pg/mL — AB (ref 0–125)

## 2013-07-12 NOTE — ED Notes (Addendum)
Pt. reports SOB with productive cough / chest congestion and tightness onset last night , denies fever , pt. uses O2 2 lpm/[place].

## 2013-07-13 ENCOUNTER — Telehealth: Payer: Self-pay | Admitting: Pulmonary Disease

## 2013-07-13 ENCOUNTER — Ambulatory Visit: Payer: Medicare Other | Admitting: Adult Health

## 2013-07-13 ENCOUNTER — Encounter (HOSPITAL_COMMUNITY): Payer: Self-pay

## 2013-07-13 ENCOUNTER — Inpatient Hospital Stay (HOSPITAL_COMMUNITY)
Admission: EM | Admit: 2013-07-13 | Discharge: 2013-07-17 | DRG: 291 | Disposition: A | Discharge status: Home Health | Payer: Medicare Other | Attending: Internal Medicine | Admitting: Internal Medicine

## 2013-07-13 DIAGNOSIS — Z794 Long term (current) use of insulin: Secondary | ICD-10-CM | POA: Diagnosis not present

## 2013-07-13 DIAGNOSIS — F3289 Other specified depressive episodes: Secondary | ICD-10-CM | POA: Diagnosis present

## 2013-07-13 DIAGNOSIS — E1140 Type 2 diabetes mellitus with diabetic neuropathy, unspecified: Secondary | ICD-10-CM | POA: Diagnosis present

## 2013-07-13 DIAGNOSIS — I4510 Unspecified right bundle-branch block: Secondary | ICD-10-CM | POA: Diagnosis present

## 2013-07-13 DIAGNOSIS — I200 Unstable angina: Secondary | ICD-10-CM | POA: Diagnosis present

## 2013-07-13 DIAGNOSIS — I509 Heart failure, unspecified: Secondary | ICD-10-CM | POA: Diagnosis present

## 2013-07-13 DIAGNOSIS — I279 Pulmonary heart disease, unspecified: Secondary | ICD-10-CM

## 2013-07-13 DIAGNOSIS — I5033 Acute on chronic diastolic (congestive) heart failure: Secondary | ICD-10-CM | POA: Diagnosis present

## 2013-07-13 DIAGNOSIS — E1149 Type 2 diabetes mellitus with other diabetic neurological complication: Secondary | ICD-10-CM

## 2013-07-13 DIAGNOSIS — I10 Essential (primary) hypertension: Secondary | ICD-10-CM | POA: Diagnosis present

## 2013-07-13 DIAGNOSIS — Z833 Family history of diabetes mellitus: Secondary | ICD-10-CM | POA: Diagnosis not present

## 2013-07-13 DIAGNOSIS — I5031 Acute diastolic (congestive) heart failure: Secondary | ICD-10-CM

## 2013-07-13 DIAGNOSIS — J4489 Other specified chronic obstructive pulmonary disease: Secondary | ICD-10-CM

## 2013-07-13 DIAGNOSIS — Z880 Allergy status to penicillin: Secondary | ICD-10-CM | POA: Diagnosis not present

## 2013-07-13 DIAGNOSIS — K219 Gastro-esophageal reflux disease without esophagitis: Secondary | ICD-10-CM

## 2013-07-13 DIAGNOSIS — F329 Major depressive disorder, single episode, unspecified: Secondary | ICD-10-CM | POA: Diagnosis present

## 2013-07-13 DIAGNOSIS — I4430 Unspecified atrioventricular block: Secondary | ICD-10-CM

## 2013-07-13 DIAGNOSIS — J9620 Acute and chronic respiratory failure, unspecified whether with hypoxia or hypercapnia: Secondary | ICD-10-CM | POA: Diagnosis present

## 2013-07-13 DIAGNOSIS — K7460 Unspecified cirrhosis of liver: Secondary | ICD-10-CM

## 2013-07-13 DIAGNOSIS — D500 Iron deficiency anemia secondary to blood loss (chronic): Secondary | ICD-10-CM | POA: Diagnosis present

## 2013-07-13 DIAGNOSIS — J9611 Chronic respiratory failure with hypoxia: Secondary | ICD-10-CM

## 2013-07-13 DIAGNOSIS — Z888 Allergy status to other drugs, medicaments and biological substances status: Secondary | ICD-10-CM | POA: Diagnosis not present

## 2013-07-13 DIAGNOSIS — F411 Generalized anxiety disorder: Secondary | ICD-10-CM | POA: Diagnosis present

## 2013-07-13 DIAGNOSIS — Z9981 Dependence on supplemental oxygen: Secondary | ICD-10-CM | POA: Diagnosis not present

## 2013-07-13 DIAGNOSIS — J449 Chronic obstructive pulmonary disease, unspecified: Secondary | ICD-10-CM

## 2013-07-13 DIAGNOSIS — Z9089 Acquired absence of other organs: Secondary | ICD-10-CM | POA: Diagnosis not present

## 2013-07-13 DIAGNOSIS — Z8541 Personal history of malignant neoplasm of cervix uteri: Secondary | ICD-10-CM | POA: Diagnosis not present

## 2013-07-13 DIAGNOSIS — Z933 Colostomy status: Secondary | ICD-10-CM | POA: Diagnosis not present

## 2013-07-13 DIAGNOSIS — IMO0002 Reserved for concepts with insufficient information to code with codable children: Secondary | ICD-10-CM | POA: Diagnosis present

## 2013-07-13 DIAGNOSIS — E1165 Type 2 diabetes mellitus with hyperglycemia: Secondary | ICD-10-CM

## 2013-07-13 DIAGNOSIS — D539 Nutritional anemia, unspecified: Secondary | ICD-10-CM

## 2013-07-13 DIAGNOSIS — Z8249 Family history of ischemic heart disease and other diseases of the circulatory system: Secondary | ICD-10-CM | POA: Diagnosis not present

## 2013-07-13 DIAGNOSIS — J439 Emphysema, unspecified: Secondary | ICD-10-CM | POA: Diagnosis present

## 2013-07-13 DIAGNOSIS — G8929 Other chronic pain: Secondary | ICD-10-CM | POA: Diagnosis present

## 2013-07-13 DIAGNOSIS — K922 Gastrointestinal hemorrhage, unspecified: Secondary | ICD-10-CM

## 2013-07-13 DIAGNOSIS — R079 Chest pain, unspecified: Secondary | ICD-10-CM

## 2013-07-13 DIAGNOSIS — I319 Disease of pericardium, unspecified: Secondary | ICD-10-CM

## 2013-07-13 DIAGNOSIS — C3490 Malignant neoplasm of unspecified part of unspecified bronchus or lung: Secondary | ICD-10-CM | POA: Diagnosis present

## 2013-07-13 DIAGNOSIS — R0602 Shortness of breath: Secondary | ICD-10-CM | POA: Diagnosis present

## 2013-07-13 DIAGNOSIS — Z6836 Body mass index (BMI) 36.0-36.9, adult: Secondary | ICD-10-CM | POA: Diagnosis not present

## 2013-07-13 DIAGNOSIS — E785 Hyperlipidemia, unspecified: Secondary | ICD-10-CM | POA: Diagnosis present

## 2013-07-13 DIAGNOSIS — Z885 Allergy status to narcotic agent status: Secondary | ICD-10-CM | POA: Diagnosis not present

## 2013-07-13 DIAGNOSIS — Z87891 Personal history of nicotine dependence: Secondary | ICD-10-CM | POA: Diagnosis not present

## 2013-07-13 DIAGNOSIS — D649 Anemia, unspecified: Secondary | ICD-10-CM

## 2013-07-13 DIAGNOSIS — J9610 Chronic respiratory failure, unspecified whether with hypoxia or hypercapnia: Secondary | ICD-10-CM

## 2013-07-13 DIAGNOSIS — I4589 Other specified conduction disorders: Secondary | ICD-10-CM | POA: Diagnosis present

## 2013-07-13 DIAGNOSIS — D696 Thrombocytopenia, unspecified: Secondary | ICD-10-CM | POA: Diagnosis present

## 2013-07-13 DIAGNOSIS — Z85118 Personal history of other malignant neoplasm of bronchus and lung: Secondary | ICD-10-CM | POA: Diagnosis not present

## 2013-07-13 LAB — GLUCOSE, CAPILLARY
GLUCOSE-CAPILLARY: 72 mg/dL (ref 70–99)
GLUCOSE-CAPILLARY: 166 mg/dL — AB (ref 70–99)
GLUCOSE-CAPILLARY: 139 mg/dL — AB (ref 70–99)
Glucose-Capillary: 187 mg/dL — ABNORMAL HIGH (ref 70–99)
Glucose-Capillary: 96 mg/dL (ref 70–99)

## 2013-07-13 LAB — TROPONIN I: Troponin I: <0.3 ng/mL (ref <0.30)

## 2013-07-13 LAB — SAMPLE TO BLOOD BANK

## 2013-07-13 MED ORDER — NITROGLYCERIN IN D5W 200-5 MCG/ML-% IV SOLN
5.0000 ug/min | INTRAVENOUS | Status: DC
  Administered 2013-07-13 (×2): 5 ug/min via INTRAVENOUS
  Filled 2013-07-13: qty 250

## 2013-07-13 MED ORDER — DICYCLOMINE HCL 10 MG PO CAPS
10.0000 mg | ORAL_CAPSULE | Freq: Three times a day (TID) | ORAL | Status: DC
  Administered 2013-07-13 – 2013-07-17 (×12): 10 mg via ORAL
  Filled 2013-07-13 (×15): qty 1

## 2013-07-13 MED ORDER — FUROSEMIDE 40 MG PO TABS
40.0000 mg | ORAL_TABLET | Freq: Every day | ORAL | Status: DC
  Filled 2013-07-13: qty 1

## 2013-07-13 MED ORDER — METOCLOPRAMIDE HCL 10 MG PO TABS
10.0000 mg | ORAL_TABLET | Freq: Three times a day (TID) | ORAL | Status: DC
  Administered 2013-07-13 – 2013-07-17 (×13): 10 mg via ORAL
  Filled 2013-07-13 (×16): qty 1

## 2013-07-13 MED ORDER — INSULIN ASPART 100 UNIT/ML ~~LOC~~ SOLN: 0.0000 [IU] | Freq: Every day | SUBCUTANEOUS | Status: DC

## 2013-07-13 MED ORDER — SODIUM CHLORIDE 0.9 % IV SOLN: 250.0000 mL | INTRAVENOUS | Status: DC | PRN

## 2013-07-13 MED ORDER — ALUM & MAG HYDROXIDE-SIMETH 200-200-20 MG/5ML PO SUSP: 30.0000 mL | Freq: Four times a day (QID) | ORAL | Status: DC | PRN

## 2013-07-13 MED ORDER — SODIUM CHLORIDE 0.9 % IJ SOLN
3.0000 mL | Freq: Two times a day (BID) | INTRAMUSCULAR | Status: DC
  Administered 2013-07-13 – 2013-07-17 (×8): 3 mL via INTRAVENOUS

## 2013-07-13 MED ORDER — PROMETHAZINE HCL 25 MG PO TABS: 25.0000 mg | ORAL_TABLET | Freq: Four times a day (QID) | ORAL | Status: DC | PRN

## 2013-07-13 MED ORDER — DIAZEPAM 5 MG PO TABS: 5.0000 mg | ORAL_TABLET | Freq: Three times a day (TID) | ORAL | Status: DC | PRN

## 2013-07-13 MED ORDER — ONDANSETRON HCL 4 MG/2ML IJ SOLN: 4.0000 mg | Freq: Four times a day (QID) | INTRAMUSCULAR | Status: DC | PRN

## 2013-07-13 MED ORDER — OXYCODONE HCL 5 MG PO TABS
20.0000 mg | ORAL_TABLET | ORAL | Status: DC | PRN
  Administered 2013-07-13 – 2013-07-17 (×18): 20 mg via ORAL
  Filled 2013-07-13 (×18): qty 4

## 2013-07-13 MED ORDER — ONDANSETRON HCL 4 MG PO TABS: 4.0000 mg | ORAL_TABLET | Freq: Four times a day (QID) | ORAL | Status: DC | PRN

## 2013-07-13 MED ORDER — ACETAMINOPHEN 650 MG RE SUPP: 650.0000 mg | Freq: Four times a day (QID) | RECTAL | Status: DC | PRN

## 2013-07-13 MED ORDER — HYDROMORPHONE HCL PF 1 MG/ML IJ SOLN
1.0000 mg | INTRAMUSCULAR | Status: DC | PRN
  Administered 2013-07-13 – 2013-07-14 (×7): 1 mg via INTRAVENOUS
  Filled 2013-07-13 (×7): qty 1

## 2013-07-13 MED ORDER — FENTANYL CITRATE 0.05 MG/ML IJ SOLN
50.0000 ug | Freq: Once | INTRAMUSCULAR | Status: AC
  Administered 2013-07-13: 50 ug via INTRAVENOUS
  Filled 2013-07-13: qty 2

## 2013-07-13 MED ORDER — METFORMIN HCL 500 MG PO TABS
1000.0000 mg | ORAL_TABLET | Freq: Two times a day (BID) | ORAL | Status: DC
  Administered 2013-07-13 – 2013-07-16 (×5): 1000 mg via ORAL
  Filled 2013-07-13 (×11): qty 2

## 2013-07-13 MED ORDER — INSULIN ASPART 100 UNIT/ML ~~LOC~~ SOLN
4.0000 [IU] | Freq: Three times a day (TID) | SUBCUTANEOUS | Status: DC
  Administered 2013-07-13 – 2013-07-16 (×6): 4 [IU] via SUBCUTANEOUS

## 2013-07-13 MED ORDER — SODIUM CHLORIDE 0.9 % IJ SOLN: 3.0000 mL | INTRAMUSCULAR | Status: DC | PRN

## 2013-07-13 MED ORDER — INSULIN GLARGINE 100 UNIT/ML ~~LOC~~ SOLN
60.0000 [IU] | Freq: Two times a day (BID) | SUBCUTANEOUS | Status: DC
  Administered 2013-07-13 – 2013-07-15 (×4): 60 [IU] via SUBCUTANEOUS
  Filled 2013-07-13 (×8): qty 0.6

## 2013-07-13 MED ORDER — PANTOPRAZOLE SODIUM 40 MG PO TBEC
40.0000 mg | DELAYED_RELEASE_TABLET | Freq: Every day | ORAL | Status: DC
  Administered 2013-07-13 – 2013-07-17 (×5): 40 mg via ORAL
  Filled 2013-07-13 (×5): qty 1

## 2013-07-13 MED ORDER — INSULIN ASPART 100 UNIT/ML ~~LOC~~ SOLN
0.0000 [IU] | Freq: Three times a day (TID) | SUBCUTANEOUS | Status: DC
  Administered 2013-07-13 – 2013-07-14 (×2): 4 [IU] via SUBCUTANEOUS
  Administered 2013-07-14: 3 [IU] via SUBCUTANEOUS

## 2013-07-13 MED ORDER — MOMETASONE FURO-FORMOTEROL FUM 100-5 MCG/ACT IN AERO
2.0000 | INHALATION_SPRAY | Freq: Two times a day (BID) | RESPIRATORY_TRACT | Status: DC
  Administered 2013-07-13 – 2013-07-17 (×8): 2 via RESPIRATORY_TRACT
  Filled 2013-07-13 (×2): qty 8.8

## 2013-07-13 MED ORDER — FUROSEMIDE 10 MG/ML IJ SOLN
40.0000 mg | Freq: Two times a day (BID) | INTRAMUSCULAR | Status: DC
  Administered 2013-07-13 – 2013-07-17 (×9): 40 mg via INTRAVENOUS
  Filled 2013-07-13 (×11): qty 4

## 2013-07-13 MED ORDER — TRAZODONE HCL 100 MG PO TABS
200.0000 mg | ORAL_TABLET | Freq: Every day | ORAL | Status: DC
  Filled 2013-07-13: qty 2

## 2013-07-13 MED ORDER — FOLIC ACID 1 MG PO TABS
1.0000 mg | ORAL_TABLET | Freq: Every evening | ORAL | Status: DC
  Administered 2013-07-13 – 2013-07-16 (×4): 1 mg via ORAL
  Filled 2013-07-13 (×5): qty 1

## 2013-07-13 MED ORDER — METHOCARBAMOL 500 MG PO TABS
500.0000 mg | ORAL_TABLET | Freq: Four times a day (QID) | ORAL | Status: DC | PRN
  Filled 2013-07-13: qty 1

## 2013-07-13 MED ORDER — RIFAXIMIN 550 MG PO TABS
550.0000 mg | ORAL_TABLET | Freq: Two times a day (BID) | ORAL | Status: DC
  Administered 2013-07-13 – 2013-07-17 (×9): 550 mg via ORAL
  Filled 2013-07-13 (×10): qty 1

## 2013-07-13 MED ORDER — REGADENOSON 0.4 MG/5ML IV SOLN
0.4000 mg | Freq: Once | INTRAVENOUS | Status: AC
  Administered 2013-07-14: 0.4 mg via INTRAVENOUS
  Filled 2013-07-13: qty 5

## 2013-07-13 MED ORDER — FUROSEMIDE 10 MG/ML IJ SOLN
40.0000 mg | Freq: Once | INTRAMUSCULAR | Status: AC
  Administered 2013-07-13: 40 mg via INTRAVENOUS
  Filled 2013-07-13: qty 4

## 2013-07-13 MED ORDER — ACETAMINOPHEN 325 MG PO TABS: 650.0000 mg | ORAL_TABLET | Freq: Four times a day (QID) | ORAL | Status: DC | PRN

## 2013-07-13 MED ORDER — INSULIN DETEMIR 100 UNIT/ML ~~LOC~~ SOLN: 20.0000 [IU] | Freq: Every day | SUBCUTANEOUS | Status: DC

## 2013-07-13 MED ORDER — SODIUM CHLORIDE 0.9 % IJ SOLN
3.0000 mL | Freq: Two times a day (BID) | INTRAMUSCULAR | Status: DC
  Administered 2013-07-13 – 2013-07-14 (×2): 3 mL via INTRAVENOUS

## 2013-07-13 MED ORDER — IPRATROPIUM-ALBUTEROL 0.5-2.5 (3) MG/3ML IN SOLN: 3.0000 mL | RESPIRATORY_TRACT | Status: DC | PRN

## 2013-07-13 NOTE — Progress Notes (Signed)
[facility] PROGRESS NOTE  [name] IDP:[id number] DOB: 1950/09/30 DOA: 07/13/2013 PCP: [name], MD I have seen and examined pt who is a 63 yo admitted this am by Dr Laren Everts withhypertension diabetes mellitus COPD liver cirrhosis and non-small lung cancer presenting with few days history of worsening shortness of breath, swelling and low-grade fever. In the ED chest x-ray showed central pulmonary vascular congestion with cardiomegaly, BNP elevated at 4136, initial EKG showed? AV dissociation but followup EKG did not confirm this. On followup today she denies chest pain at this time, states still [place] of breath with minimal activity. Her cardiac enzymes so far negative. Will continue diuresing and current management plan as per Dr. Laren Everts, followup on 2-D echo, I have consulted cardiology for further recommendations>> follow. I have updated patient and family at bedside.       [facility] Pager [phone]. If 7PM-7AM, please contact night-coverage at [email or web address], password [facility] 07/13/2013, 11:53 AM  LOS: 0 days

## 2013-07-13 NOTE — H&P (Signed)
[facility]                                                                                    Patient Demographics  [name], is a 63 y.o. female  CSN: [id number]  MRN: [id number]  DOB - 07/25/1950  Admit Date - 07/13/2013  Outpatient Primary MD for the patient is [name], MD   With History of -  Past Medical History  Diagnosis Date  . Cirrhosis   . GERD (gastroesophageal reflux disease)   . Cervical disc syndrome   . Lung cancer     squamous cell  . Chronic respiratory failure   . Diverticulitis of colon   . Perforation of colon   . Arthritis   . IBS (irritable bowel syndrome)   . Chronic lower GI bleeding   . Overactive bladder   . Cervical cancer   . Thrombocytopenia     sees Dr. Julien Nordmann   . Splenomegaly   . Anxiety   . Depression   . Hypertension   . Asthma   . Chronic headache disorder   . Blood transfusion without reported diagnosis   . Heart murmur   . Hyperlipidemia   . Lung cancer   . COPD (chronic obstructive pulmonary disease)     sees Dr. Gwenette Greet   . Anemia     sees Dr. Julien Nordmann, due to chronic disease and GI losses   . Diabetes mellitus     sees Dr. Cruzita Lederer   . Colostomy care   . Hypercalcemia       Past Surgical History  Procedure Laterality Date  . Appendectomy    . Cholecystectomy    . Colostomy    . Pneumonectomy    . Cervix removed    . Bowel resection    . Esophagogastroduodenoscopy  03/19/2011    Procedure: ESOPHAGOGASTRODUODENOSCOPY (EGD);  Surgeon: [name], MD;  Location: [facility];  Service: Endoscopy;  Laterality: N/A;  . Colonoscopy  03/19/2011    Procedure: COLONOSCOPY;  Surgeon: [name], MD;  Location: [facility];  Service: Endoscopy;  Laterality: N/A;  . Givens capsule study  03/20/2011    Procedure: GIVENS CAPSULE STUDY;  Surgeon: [name], MD;  Location: [facility];  Service: Endoscopy;  Laterality: N/A;  . Small bowel obstruction repair  March 2012  . Umbilical  hernia repair  March 2012  . Splenectomy, total N/A 02/24/2013    Procedure: SPLENECTOMY;  Surgeon: Harl Bowie, MD;  Location: [facility];  Service: General;  Laterality: N/A;  . Orif patella fracture Left 04/21/2013    DR [name]  . Orif patella Left 04/21/2013    Procedure: OPEN REDUCTION INTERNAL (ORIF) FIXATION PATELLA;  Surgeon: Augustin Schooling, MD;  Location: [facility];  Service: Orthopedics;  Laterality: Left;    in for   Chief Complaint  Patient presents with  . Shortness of Breath     HPI  [name]  is a 63 y.o. female, past medical history significant for hypertension diabetes mellitus COPD liver cirrhosis and  non-small lung cancer presenting with few days history of worsening shortness of breath, swelling and low-grade fever her  primary medical doctor increased her Lasix to 40 mg daily however she still continued to swell . No nausea or vomiting although complains of bouts of diarrhea times. She also complains of pain all over including her left knee which she injured on March of this year, chest pain and abdominal pain . The patient was found to congestive heart failure clinically and per chest x-ray and BNP and I was called to admit. To note also that the patient had an EKG on admission that showed A-V dissociation that resolved on the second EKG. Home health nurse reports increase in weight from 177 pounds to 205 pounds in a few weeks the    Review of Systems    In addition to the HPI above,  No Headache, No changes with Vision or hearing, No problems swallowing food or Liquids,, No Blood in stool or Urine, No dysuria, No new skin rashes or bruises, No new weakness, tingling, numbness in any extremity, No polyuria, polydypsia or polyphagia, No significant Mental Stressors.  A full 10 point Review of Systems was done, except as stated above, all other Review of Systems were negative.   Social History History  Substance Use Topics  . Smoking status: Former Smoker  -- 2.00 packs/day for 35 years    Types: Cigarettes    Quit date: 03/24/2002  . Smokeless tobacco: Never Used  . Alcohol Use: No     Family History Family History  Problem Relation Age of Onset  . Coronary artery disease    . Diabetes type II    . Anesthesia problems Neg Hx   . Hypotension Neg Hx   . Malignant hyperthermia Neg Hx   . Pseudochol deficiency Neg Hx   . Heart attack Mother   . Diabetes type II Mother   . Cirrhosis Father      Prior to Admission medications   Medication Sig Start Date End Date Taking? Authorizing Wynonna Fitzhenry  albuterol-ipratropium (COMBIVENT) 18-103 MCG/ACT inhaler Inhale 2 puffs into the lungs every 4 (four) hours as needed for wheezing or shortness of breath.    Yes Historical Amaiyah Nordhoff, MD  diazepam (VALIUM) 5 MG tablet Take 1 tablet (5 mg total) by mouth 3 (three) times daily as needed for anxiety. 05/20/13  Yes Eugenie Filler, MD  dicyclomine (BENTYL) 10 MG capsule Take 10 mg by mouth 3 (three) times daily.    Yes Historical Nuel Dejaynes, MD  esomeprazole (NEXIUM) 40 MG capsule Take 40 mg by mouth every evening.   Yes Historical Hiilei Gerst, MD  Fluticasone-Salmeterol (ADVAIR) 250-50 MCG/DOSE AEPB Inhale 1 puff into the lungs every 12 (twelve) hours. 04/12/13  Yes [name], MD  folic acid (FOLVITE) 1 MG tablet Take 1 tablet (1 mg total) by mouth every evening. 04/12/13  Yes [name], MD  furosemide (LASIX) 40 MG tablet Take 1 tablet (40 mg total) by mouth daily. 06/23/13  Yes [name], MD  Insulin Glargine (LANTUS SOLOSTAR) 100 UNIT/ML Solostar Pen Inject 60 Units into the skin 2 (two) times daily.   Yes Historical Belford Pascucci, MD  insulin lispro (HUMALOG KWIKPEN) 100 UNIT/ML KiwkPen Inject 35-47 Units into the skin 3 (three) times daily. 40 units in the morning, 35 units in the afternoon and 47 units with the evening meal   Yes Historical Brodan Grewell, MD  metFORMIN (GLUCOPHAGE) 1000 MG tablet Take 1,000 mg by mouth 2 (two) times daily with a meal.    Yes Historical Vonetta Foulk, MD  methocarbamol (ROBAXIN) 500 MG tablet Take 1  tablet (500 mg total) by mouth every 6 (six) hours as needed for muscle spasms. 05/03/13  Yes Ivan Anchors Love, PA-C  metoCLOPramide (REGLAN) 10 MG tablet Take 1 tablet (10 mg total) by mouth 3 (three) times daily. 04/12/13  Yes [name], MD  Oxycodone HCl 20 MG TABS Take 20 mg by mouth every 3 (three) hours as needed (pain).   Yes Historical Pierre Cumpton, MD  promethazine (PHENERGAN) 25 MG tablet Take 25 mg by mouth every 6 (six) hours as needed for nausea or vomiting.    Yes Historical Bacilio Abascal, MD  rifaximin (XIFAXAN) 550 MG TABS tablet Take 550 mg by mouth 2 (two) times daily. 06/29/12  Yes [name], MD  traZODone (DESYREL) 100 MG tablet Take 2 tablets (200 mg total) by mouth at bedtime. 05/03/13  Yes Ivan Anchors Love, PA-C    Allergies  Allergen Reactions  . Penicillins Anaphylaxis and Rash  . Acetaminophen Other (See Comments)    Cirrhosis of liver  . Codeine Phosphate Other (See Comments)    REACTION: Stomach cramps  . Hydrocodone-Acetaminophen Other (See Comments)    REACTION: hallucinations  . Hydrocodone-Acetaminophen Other (See Comments)  . Morphine Other (See Comments)    REACTION: Lowers BP  . Other Other (See Comments)    AGENT:  Per pt, cannot take blood thinners due to cirrhosis of the liver  . Cephalexin Swelling and Rash    Physical Exam  Vitals  Blood pressure 139/54, pulse 65, temperature 97.9 F (36.6 C), temperature source Oral, resp. rate 21, height 5' (1.524 m), weight 92.987 kg (205 lb), SpO2 99.00%.   1. General chronically ill white female looks tired.  2.  Not Suicidal or Homicidal, Awake Alert, Oriented X 3.  3. No F.N deficits, ALL C.Nerves Intact, Strength 5/5 all 4 extremities,  4. Ears and Eyes appear Normal, Conjunctivae clear, PERRLA. Moist Oral Mucosa.  5. Supple Neck, No JVD, No cervical lymphadenopathy appriciated, No Carotid Bruits.  6. Symmetrical Chest wall movement,  bilateral basilar crackles  7. RRR, No Gallops, Rubs or Murmurs, No Parasternal Heave.  8. Positive Bowel Sounds, Abdomen Soft, Non tender, colostomy bag noted and functioning   9.  No Cyanosis, No Skin Rash or Bruise.  10. Good muscle tone, left knee aches wrap were noted, +3 edema in the lower extremities  11. No Palpable Lymph Nodes in Neck or Axillae    Data Review  CBC  Recent Labs Lab 07/12/13 2232  WBC 13.9*  HGB 8.8*  HCT 28.0*  PLT 471*  MCV 81.9  MCH 25.7*  MCHC 31.4  RDW 16.9*  LYMPHSABS 3.1  MONOABS 2.8*  EOSABS 0.3  BASOSABS 0.1   ------------------------------------------------------------------------------------------------------------------  Chemistries   Recent Labs Lab 07/12/13 2232  NA 138  K 4.5  CL 99  CO2 23  GLUCOSE 99  BUN 36*  CREATININE 0.97  CALCIUM 10.7*  AST 22  ALT 17  ALKPHOS 107  BILITOT <0.2*   ------------------------------------------------------------------------------------------------------------------ estimated creatinine clearance is 62 ml/min (by C-G formula based on Cr of 0.97). ------------------------------------------------------------------------------------------------------------------ No results found for this basename: TSH, T4TOTAL, FREET3, T3FREE, THYROIDAB,  in the last 72 hours   Coagulation profile No results found for this basename: INR, PROTIME,  in the last 168 hours ------------------------------------------------------------------------------------------------------------------- No results found for this basename: DDIMER,  in the last 72 hours -------------------------------------------------------------------------------------------------------------------  Cardiac Enzymes No results found for this basename: CK, CKMB, TROPONINI, MYOGLOBIN,  in the last 168 hours ------------------------------------------------------------------------------------------------------------------ No components  found with  this basename: POCBNP,       Imaging results:   Dg Chest 2 View  07/12/2013   CLINICAL DATA:  Shortness of breath, low-grade fever.  EXAM: CHEST  2 VIEW  COMPARISON:  Chest radiograph May 19, 2013.  FINDINGS: The cardiac silhouette remains moderately enlarged, mediastinal silhouette is nonsuspicious. Stable chronic interstitial changes. Surgical clips in left hilum, and projecting in mid mediastinum. Central pulmonary vasculature congestion, without pleural effusions or focal consolidation. No pneumothorax appears similar scarring in left lung base.  Soft tissue planes and included osseous structures are nonsuspicious. Surgical clips in the right abdomen likely reflect cholecystectomy.  IMPRESSION: Stable cardiomegaly, central pulmonary vasculature congestion.   Electronically Signed   By: Elon Alas   On: 07/12/2013 23:54    My personal review of EKG: Rhythm NSR, at 89 beats per minute with early right bundle branch block. Her previous EKG showed A-V dissociation on admission    Assessment & Plan  1.  Pulmonary edema with increase in weight and anasarca, last echo in 2007    Start Lasix 40 mg IV twice a day     Continue nitro drip    Fluid restriction    Check echocardiogram    Check TSH 2. Chest pain, atypical-EKG on admission shows A-V dissociation that resolved on the second EKG     Check serial troponins     Consult cardiology in a.m. for evaluation 3. Chronic pain     Patient allergic to morphine, reports that she can take Dilaudid 4. Complicated history of hypertension, diabetes mellitus, COPD and cirrhosis 5. Anemia with history of bleeding from around the ostomy bag, chronic     DVT Prophylaxis SCDs  AM Labs Ordered, also please review Full Orders  Family Communication: Admission, patients condition and plan of care including tests being ordered have been discussed with the patient and family who indicate understanding and agree with the plan and  Code Status.  Code Status full  Disposition Plan: Home with home health  Time spent in minutes : 34 minutes  Condition GUARDED   @SIGNATURE @

## 2013-07-13 NOTE — Care Management Note (Addendum)
    Page 1 of 2   07/17/2013     1:05:34 PM CARE MANAGEMENT NOTE 07/17/2013  Patient:  [name], [name]   Account Number:  [id number]  Date Initiated:  07/13/2013  Documentation initiated by:  [facility]  Subjective/Objective Assessment:   63 y.o. female, PMHx for hypertension diabetes mellitus COPD liver cirrhosis and  non-small lung cancer presenting with few days history of worsening shortness of breath, swelling and low-grade fever.// Home with family.     Action/Plan:   Start Lasix 40 mg IV twice a day; Continue nitro drip; Fluid restriction; Check echocardiogram; Check TSH. //Access for [facility] services.   Anticipated DC Date:  07/16/2013   Anticipated DC Plan:  [place]  CM consult      [facility] Choice  HOME HEALTH  Resumption Of Svcs/PTA [name]   Choice offered to / List presented to:     DME arranged  OTHER - SEE COMMENT      DME agency  [place] [facility] arranged  HH-1 RN  HH-10 DISEASE MANAGEMENT  HH-2 PT      [place]   Status of service:  Completed, signed off Medicare Important Message given?  NO (If response is "NO", the following Medicare IM given date fields will be blank) Date Medicare IM given:  07/15/2013 Date Additional Medicare IM given:    Discharge Disposition:  [place]  Per UR Regulation:  Reviewed for med. necessity/level of care/duration of stay  If discussed at [place] of Stay Meetings, dates discussed:    Comments:  07/17/13 11:00 CM met with pt in room and notified her MD has placed order for a conservative regulator for her portable tanks to extend her ability to be out of the home for a longer period of time.  Pt cannot carry two portable tanks and one tank with a continuous regulator only gives her 2 hours to complete her out of house tasks.  [place] DME rep notified. Regulator will be delivered to pt's home. No other CM needs were  communicated.  Mariane Masters, BSN, [place].  07/15/13 1020 Catherine J. Clydene Laming, RN, BSN, [employer] [phone] Spoke with pt at bedside regarding discharge planning for [facility]. Pt currently active with [facility] for RN/PT services.  Resumption of care requested.  [facility] rep notified.  No DME needs identified at this time.  [facility] fax: [phone].

## 2013-07-13 NOTE — Progress Notes (Signed)
Pt with order for nitro gtt at 70mcg/hr or 1.52ml/hr. Nitro currently running at 67mcg/hr. Per chart  Nitro scanned at 1.47ml/hr or 31mcg/hr. MD on call K Schorr notified to clarify and verify rate. Ordered to leave at 39mcg/hr or 1.36ml/hr per order. Pt with no complaints of chest pain at this time.  Will continue to monitor. Ronnette Hila, RN

## 2013-07-13 NOTE — Telephone Encounter (Signed)
Called spoke with kisha. Pt scheduled an appt to come in and see TP this afternoon for acute visit. Nothing further needed

## 2013-07-13 NOTE — ED Notes (Signed)
Dr. Sharol Given made aware of pts pain and request for pain medication.

## 2013-07-13 NOTE — ED Provider Notes (Signed)
CSN: [id number]     Arrival date & time 07/12/13  2210 History   First MD Initiated Contact with Patient 07/13/13 0131     Chief Complaint  Patient presents with  . Shortness of Breath     (Consider location/radiation/quality/duration/timing/severity/associated sxs/prior Treatment) HPI 63 year old female presents to the emergency apartment from home with complaint of worsening shortness of breath since Sunday night.  Patient has history of COPD, normally is on 2 L and has had increased to 3 L.  Tonight around 7 PM her home nurse was visiting, and was concerned as her weight has gone from 177-205, and she was having crackles heard in her lungs.  Patient does not have previous history of CHF.  She does have history of cirrhosis, lung cancer, chronic GI bleed, COPD diabetes hypertension.  Patient reports she's been feeling worse and worse over the last several weeks with increasing weakness and fatigue.  She reports that she has diffuse swelling to legs abdomen.  She is dyspnea on exertion and orthopnea.  Patient reports that she's had increasing blood in her ostomy bag.  She has had low-grade fevers. Past Medical History  Diagnosis Date  . Cirrhosis   . GERD (gastroesophageal reflux disease)   . Cervical disc syndrome   . Lung cancer     squamous cell  . Chronic respiratory failure   . Diverticulitis of colon   . Perforation of colon   . Arthritis   . IBS (irritable bowel syndrome)   . Chronic lower GI bleeding   . Overactive bladder   . Cervical cancer   . Thrombocytopenia     sees Dr. Julien Nordmann   . Splenomegaly   . Anxiety   . Depression   . Hypertension   . Asthma   . Chronic headache disorder   . Blood transfusion without reported diagnosis   . Heart murmur   . Hyperlipidemia   . Lung cancer   . COPD (chronic obstructive pulmonary disease)     sees Dr. Gwenette Greet   . Anemia     sees Dr. Julien Nordmann, due to chronic disease and GI losses   . Diabetes mellitus     sees Dr. Cruzita Lederer    . Colostomy care   . Hypercalcemia    Past Surgical History  Procedure Laterality Date  . Appendectomy    . Cholecystectomy    . Colostomy    . Pneumonectomy    . Cervix removed    . Bowel resection    . Esophagogastroduodenoscopy  03/19/2011    Procedure: ESOPHAGOGASTRODUODENOSCOPY (EGD);  Surgeon: [name], MD;  Location: [facility];  Service: Endoscopy;  Laterality: N/A;  . Colonoscopy  03/19/2011    Procedure: COLONOSCOPY;  Surgeon: [name], MD;  Location: [facility];  Service: Endoscopy;  Laterality: N/A;  . Givens capsule study  03/20/2011    Procedure: GIVENS CAPSULE STUDY;  Surgeon: [name], MD;  Location: [facility];  Service: Endoscopy;  Laterality: N/A;  . Small bowel obstruction repair  March 2012  . Umbilical hernia repair  March 2012  . Splenectomy, total N/A 02/24/2013    Procedure: SPLENECTOMY;  Surgeon: Harl Bowie, MD;  Location: [facility];  Service: General;  Laterality: N/A;  . Orif patella fracture Left 04/21/2013    DR [name]  . Orif patella Left 04/21/2013    Procedure: OPEN REDUCTION INTERNAL (ORIF) FIXATION PATELLA;  Surgeon: Augustin Schooling, MD;  Location: [facility] [place];  Service: Orthopedics;  Laterality: Left;  Family History  Problem Relation Age of Onset  . Coronary artery disease    . Diabetes type II    . Anesthesia problems Neg Hx   . Hypotension Neg Hx   . Malignant hyperthermia Neg Hx   . Pseudochol deficiency Neg Hx   . Heart attack Mother   . Diabetes type II Mother   . Cirrhosis Father    History  Substance Use Topics  . Smoking status: Former Smoker -- 2.00 packs/day for 35 years    Types: Cigarettes    Quit date: 03/24/2002  . Smokeless tobacco: Never Used  . Alcohol Use: No   OB History   Grav Para Term Preterm Abortions TAB SAB Ect Mult Living                 Review of Systems  See History of Present Illness; otherwise all other systems are reviewed and negative   Allergies  Penicillins;  Acetaminophen; Codeine phosphate; Hydrocodone-acetaminophen; Hydrocodone-acetaminophen; Morphine; Other; and Cephalexin  Home Medications   Prior to Admission medications   Medication Sig Start Date End Date Taking? Authorizing Provider  albuterol-ipratropium (COMBIVENT) 18-103 MCG/ACT inhaler Inhale 2 puffs into the lungs every 4 (four) hours as needed for wheezing or shortness of breath.    Yes Historical Provider, MD  diazepam (VALIUM) 5 MG tablet Take 1 tablet (5 mg total) by mouth 3 (three) times daily as needed for anxiety. 05/20/13  Yes Eugenie Filler, MD  dicyclomine (BENTYL) 10 MG capsule Take 10 mg by mouth 3 (three) times daily.    Yes Historical Provider, MD  esomeprazole (NEXIUM) 40 MG capsule Take 40 mg by mouth every evening.   Yes Historical Provider, MD  Fluticasone-Salmeterol (ADVAIR) 250-50 MCG/DOSE AEPB Inhale 1 puff into the lungs every 12 (twelve) hours. 04/12/13  Yes [name], MD  folic acid (FOLVITE) 1 MG tablet Take 1 tablet (1 mg total) by mouth every evening. 04/12/13  Yes [name], MD  furosemide (LASIX) 40 MG tablet Take 1 tablet (40 mg total) by mouth daily. 06/23/13  Yes [name], MD  Insulin Glargine (LANTUS SOLOSTAR) 100 UNIT/ML Solostar Pen Inject 60 Units into the skin 2 (two) times daily.   Yes Historical Provider, MD  insulin lispro (HUMALOG KWIKPEN) 100 UNIT/ML KiwkPen Inject 35-47 Units into the skin 3 (three) times daily. 40 units in the morning, 35 units in the afternoon and 47 units with the evening meal   Yes Historical Provider, MD  metFORMIN (GLUCOPHAGE) 1000 MG tablet Take 1,000 mg by mouth 2 (two) times daily with a meal.   Yes Historical Provider, MD  methocarbamol (ROBAXIN) 500 MG tablet Take 1 tablet (500 mg total) by mouth every 6 (six) hours as needed for muscle spasms. 05/03/13  Yes Ivan Anchors Love, PA-C  metoCLOPramide (REGLAN) 10 MG tablet Take 1 tablet (10 mg total) by mouth 3 (three) times daily. 04/12/13  Yes [name], MD   Oxycodone HCl 20 MG TABS Take 20 mg by mouth every 3 (three) hours as needed (pain).   Yes Historical Provider, MD  promethazine (PHENERGAN) 25 MG tablet Take 25 mg by mouth every 6 (six) hours as needed for nausea or vomiting.    Yes Historical Provider, MD  rifaximin (XIFAXAN) 550 MG TABS tablet Take 550 mg by mouth 2 (two) times daily. 06/29/12  Yes [name], MD  traZODone (DESYREL) 100 MG tablet Take 2 tablets (200 mg total) by mouth at bedtime. 05/03/13  Yes Ivan Anchors Love, PA-C   BP 151/55  Pulse 65  Temp(Src) 97.9 F (36.6 C) (Oral)  Resp 21  Ht 5' (1.524 m)  Wt 205 lb (92.987 kg)  BMI 40.04 kg/m2  SpO2 99% Physical Exam  Nursing note and vitals reviewed. Constitutional: She is oriented to person, place, and time. She appears well-developed and well-nourished. She appears distressed.  HENT:  Head: Normocephalic and atraumatic.  Nose: Nose normal.  Mouth/Throat: Oropharynx is clear and moist.  Eyes: Conjunctivae and EOM are normal. Pupils are equal, round, and reactive to light.  Neck: Normal range of motion. Neck supple. No JVD present. No tracheal deviation present. No thyromegaly present.  Cardiovascular: Normal rate, regular rhythm, normal heart sounds and intact distal pulses.  Exam reveals no gallop and no friction rub.   No murmur heard. Pulmonary/Chest: Effort normal. No stridor. No respiratory distress. She has no wheezes. She has rales. She exhibits no tenderness.  Abdominal: Soft. Bowel sounds are normal. She exhibits no distension and no mass. There is no tenderness. There is no rebound and no guarding.  Abdominal wall edema noted  Musculoskeletal: Normal range of motion. She exhibits edema (edema up to above the knees). She exhibits no tenderness.  Lymphadenopathy:    She has no cervical adenopathy.  Neurological: She is oriented to person, place, and time. She has normal reflexes. No cranial nerve deficit. She exhibits normal muscle tone. Coordination normal.   Skin: No rash noted. She is diaphoretic. No erythema. No pallor.  Psychiatric: She has a normal mood and affect. Her behavior is normal. Judgment and thought content normal.    ED Course  Procedures (including critical care time) Labs Review Labs Reviewed  CBC WITH DIFFERENTIAL - Abnormal; Notable for the following:    WBC 13.9 (*)    RBC 3.42 (*)    Hemoglobin 8.8 (*)    HCT 28.0 (*)    MCH 25.7 (*)    RDW 16.9 (*)    Platelets 471 (*)    Monocytes Relative 20 (*)    Monocytes Absolute 2.8 (*)    All other components within normal limits  COMPREHENSIVE METABOLIC PANEL - Abnormal; Notable for the following:    BUN 36 (*)    Calcium 10.7 (*)    Albumin 3.3 (*)    Total Bilirubin <0.2 (*)    GFR calc non Af Amer 62 (*)    GFR calc Af Amer 72 (*)    All other components within normal limits  PRO B NATRIURETIC PEPTIDE - Abnormal; Notable for the following:    Pro B Natriuretic peptide (BNP) 4136.0 (*)    All other components within normal limits  GLUCOSE, CAPILLARY  TROPONIN I  TROPONIN I  TROPONIN I  I-STAT TROPOININ, ED  POC OCCULT BLOOD, ED  SAMPLE TO BLOOD BANK    Imaging Review Dg Chest 2 View  07/12/2013   CLINICAL DATA:  Shortness of breath, low-grade fever.  EXAM: CHEST  2 VIEW  COMPARISON:  Chest radiograph May 19, 2013.  FINDINGS: The cardiac silhouette remains moderately enlarged, mediastinal silhouette is nonsuspicious. Stable chronic interstitial changes. Surgical clips in left hilum, and projecting in mid mediastinum. Central pulmonary vasculature congestion, without pleural effusions or focal consolidation. No pneumothorax appears similar scarring in left lung base.  Soft tissue planes and included osseous structures are nonsuspicious. Surgical clips in the right abdomen likely reflect cholecystectomy.  IMPRESSION: Stable cardiomegaly, central pulmonary vasculature congestion.   Electronically Signed   By:  Courtnay  Bloomer   On: 07/12/2013 23:54         EKG Interpretation  Date/Time:  Tuesday July 13 2013 02:03:53 EDT Ventricular Rate:  89 PR Interval:  150 QRS Duration: 91 QT Interval:  372 QTC Calculation: 453 R Axis:   15 Text Interpretation:  Sinus rhythm Atrial premature complex Low voltage, precordial leads RSR' in V1 or V2, right VCD or RVH Abnormal T, consider ischemia, lateral leads AV dissociation has resolved from earlier Confirmed by Avika Carbine  MD, Deklen Popelka ([id number]) on 07/13/2013 2:52:13 AM       MDM   Final diagnoses:  CHF (congestive heart failure)  GI bleed  Anemia  COPD (chronic obstructive pulmonary disease)   63 year old female with new CHF, initial EKG with A-V block which has resolved.  Patient has worsening anemia secondary to her chronic GI bleed.  Plan for admission to hospital was given multiple current issues, will need cardiology of evaluation.  May need transfusion.  Will start on nitroglycerin for chest pain and CHF.  Will need additional Lasix.  Kalman Drape, MD 07/13/13 [phone]

## 2013-07-13 NOTE — Consult Note (Signed)
CARDIOLOGY CONSULT NOTE   Patient ID: [name] MRN: [id number] DOB/AGE: 62-May-1952 62 y.o.  Admit date: 07/13/2013  Primary Physician   [name], MD Primary Cardiologist   New Reason for Consultation   AV dissociation / new onset CHF  HPI: [name] is a 63 y.o. female with a history of hx of RHD, HTN, DM, morbid obesity, COPD on 2L home 02, liver cirrhosis, non-small lung cancer s/p L lobectomy, perforated colon due to diverticular disease with colostomy placement 2007, anemia due to chronic bleeding from colostomy bag, recent splenectomy in 01/2013, and most recently patellar fracture requiring ORIF 03/2013 who presented to [facility] yesterday worsening shortness of breath, edema and low-grade fever x 2-3 days. She was found to be in CHF exacerbation and have AV dissociation on admission ECG.  The patient notes that for the past week she has been having low-grade fevers and generally not feeling well. She started to notice increasing lower extremity edema and shortness of breath over the past week. She also reports orthopnea as well as PND. Home health nurse reports increase in weight from 177 pounds to 205 pounds in a few weeks, unsure of her dry weight. She also had an episode of chest pain while in the ED that she describes as a central chest tightness. It did not radiate but was associated with worsening shortness of breath, diaphoresis, and mild nausea. This lasted about 10 minutes and resolved on its own. It has occurred a couple more times since the initial episode. She has never had anything like this before. She's has no history of coronary artery disease but does have a family history significant for CAD and heart failure. She had a stress test 5-6 years ago which was reportedly normal as well as an echo in 2007 with a small pericardial effusion. Her BNP is elevated to 4K and she has pulmonary vascular congestion  on chest x-ray. She does note an episode of  lightheadedness but this was in the setting of hypoglycemia and she felt better after a snack.   Past Medical History  Diagnosis Date  . Cirrhosis   . GERD (gastroesophageal reflux disease)   . Cervical disc syndrome   . Lung cancer     squamous cell  . Chronic respiratory failure   . Diverticulitis of colon   . Perforation of colon   . Arthritis   . IBS (irritable bowel syndrome)   . Chronic lower GI bleeding   . Overactive bladder   . Cervical cancer   . Thrombocytopenia     sees Dr. Julien Nordmann   . Splenomegaly   . Depression   . Hypertension   . Asthma   . Chronic headache disorder   . Blood transfusion without reported diagnosis   . Heart murmur   . Hyperlipidemia   . Lung cancer   . COPD (chronic obstructive pulmonary disease)     sees Dr. Gwenette Greet   . Anemia     sees Dr. Julien Nordmann, due to chronic disease and GI losses   . Diabetes mellitus     sees Dr. Cruzita Lederer   . Colostomy care   . Hypercalcemia      Past Surgical History  Procedure Laterality Date  . Appendectomy    . Cholecystectomy    . Colostomy    . Pneumonectomy    . Cervix removed    . Bowel resection    . Esophagogastroduodenoscopy  03/19/2011    Procedure: ESOPHAGOGASTRODUODENOSCOPY (  EGD);  Surgeon: [name], MD;  Location: [facility];  Service: Endoscopy;  Laterality: N/A;  . Colonoscopy  03/19/2011    Procedure: COLONOSCOPY;  Surgeon: [name], MD;  Location: [facility];  Service: Endoscopy;  Laterality: N/A;  . Givens capsule study  03/20/2011    Procedure: GIVENS CAPSULE STUDY;  Surgeon: [name], MD;  Location: [facility];  Service: Endoscopy;  Laterality: N/A;  . Small bowel obstruction repair  March 2012  . Umbilical hernia repair  March 2012  . Splenectomy, total N/A 02/24/2013    Procedure: SPLENECTOMY;  Surgeon: Harl Bowie, MD;  Location: [place];  Service: General;  Laterality: N/A;  . Orif patella fracture Left 04/21/2013    DR [name]  . Orif patella Left  04/21/2013    Procedure: OPEN REDUCTION INTERNAL (ORIF) FIXATION PATELLA;  Surgeon: Augustin Schooling, MD;  Location: [place];  Service: Orthopedics;  Laterality: Left;    Allergies  Allergen Reactions  . Penicillins Anaphylaxis and Rash  . Acetaminophen Other (See Comments)    Cirrhosis of liver  . Codeine Phosphate Other (See Comments)    REACTION: Stomach cramps  . Hydrocodone-Acetaminophen Other (See Comments)    REACTION: hallucinations  . Hydrocodone-Acetaminophen Other (See Comments)  . Morphine Other (See Comments)    REACTION: Lowers BP  . Other Other (See Comments)    AGENT:  Per pt, cannot take blood thinners due to cirrhosis of the liver  . Cephalexin Swelling and Rash    I have reviewed the patient's current medications . dicyclomine  10 mg Oral TID  . folic acid  1 mg Oral QPM  . furosemide  40 mg Intravenous BID  . insulin aspart  0-20 Units Subcutaneous TID WC  . insulin aspart  0-5 Units Subcutaneous QHS  . insulin aspart  4 Units Subcutaneous TID WC  . insulin glargine  60 Units Subcutaneous BID  . metFORMIN  1,000 mg Oral BID WC  . metoCLOPramide  10 mg Oral TID WC  . mometasone-formoterol  2 puff Inhalation BID  . pantoprazole  40 mg Oral Daily  . rifaximin  550 mg Oral BID  . sodium chloride  3 mL Intravenous Q12H  . sodium chloride  3 mL Intravenous Q12H  . traZODone  200 mg Oral QHS   . nitroGLYCERIN 5 mcg/min (07/13/13 0535)   sodium chloride, acetaminophen, acetaminophen, alum & mag hydroxide-simeth, diazepam, HYDROmorphone (DILAUDID) injection, ipratropium-albuterol, methocarbamol, ondansetron (ZOFRAN) IV, ondansetron, oxyCODONE, promethazine, sodium chloride  Prior to Admission medications   Medication Sig Start Date End Date Taking? Authorizing Provider  albuterol-ipratropium (COMBIVENT) 18-103 MCG/ACT inhaler Inhale 2 puffs into the lungs every 4 (four) hours as needed for wheezing or shortness of breath.    Yes Historical Provider, MD  diazepam  (VALIUM) 5 MG tablet Take 1 tablet (5 mg total) by mouth 3 (three) times daily as needed for anxiety. 05/20/13  Yes Eugenie Filler, MD  dicyclomine (BENTYL) 10 MG capsule Take 10 mg by mouth 3 (three) times daily.    Yes Historical Provider, MD  esomeprazole (NEXIUM) 40 MG capsule Take 40 mg by mouth every evening.   Yes Historical Provider, MD  Fluticasone-Salmeterol (ADVAIR) 250-50 MCG/DOSE AEPB Inhale 1 puff into the lungs every 12 (twelve) hours. 04/12/13  Yes [name], MD  folic acid (FOLVITE) 1 MG tablet Take 1 tablet (1 mg total) by mouth every evening. 04/12/13  Yes [name], MD  furosemide (LASIX) 40 MG  tablet Take 1 tablet (40 mg total) by mouth daily. 06/23/13  Yes [name], MD  Insulin Glargine (LANTUS SOLOSTAR) 100 UNIT/ML Solostar Pen Inject 60 Units into the skin 2 (two) times daily.   Yes Historical Provider, MD  insulin lispro (HUMALOG KWIKPEN) 100 UNIT/ML KiwkPen Inject 35-47 Units into the skin 3 (three) times daily. 40 units in the morning, 35 units in the afternoon and 47 units with the evening meal   Yes Historical Provider, MD  metFORMIN (GLUCOPHAGE) 1000 MG tablet Take 1,000 mg by mouth 2 (two) times daily with a meal.   Yes Historical Provider, MD  methocarbamol (ROBAXIN) 500 MG tablet Take 1 tablet (500 mg total) by mouth every 6 (six) hours as needed for muscle spasms. 05/03/13  Yes Ivan Anchors Love, PA-C  metoCLOPramide (REGLAN) 10 MG tablet Take 1 tablet (10 mg total) by mouth 3 (three) times daily. 04/12/13  Yes [name], MD  Oxycodone HCl 20 MG TABS Take 20 mg by mouth every 3 (three) hours as needed (pain).   Yes Historical Provider, MD  promethazine (PHENERGAN) 25 MG tablet Take 25 mg by mouth every 6 (six) hours as needed for nausea or vomiting.    Yes Historical Provider, MD  rifaximin (XIFAXAN) 550 MG TABS tablet Take 550 mg by mouth 2 (two) times daily. 06/29/12  Yes [name], MD  traZODone (DESYREL) 100 MG tablet Take 2 tablets (200 mg total) by  mouth at bedtime. 05/03/13  Yes Bary Leriche, PA-C     History   Social History  . Marital Status: Divorced    Spouse Name: N/A    Number of Children: N/A  . Years of Education: N/A   Occupational History  . disabled    Social History Main Topics  . Smoking status: Former Smoker -- 2.00 packs/day for 35 years    Types: Cigarettes    Quit date: 03/24/2002  . Smokeless tobacco: Never Used  . Alcohol Use: No  . Drug Use: No  . Sexual Activity: No   Other Topics Concern  . Not on file   Social History Narrative   Regular exercise: a little   Caffeine use: 2 cups of coffee in am          Family Status  Relation Status Death Age  . Mother Deceased   . Father Deceased    Family History  Problem Relation Age of Onset  . Coronary artery disease    . Diabetes type II    . Anesthesia problems Neg Hx   . Hypotension Neg Hx   . Malignant hyperthermia Neg Hx   . Pseudochol deficiency Neg Hx   . Heart attack Mother   . Diabetes type II Mother   . Cirrhosis Father      ROS:  Full 14 point review of systems complete and found to be negative unless listed above.  Physical Exam: Blood pressure 165/68, pulse 72, temperature 97.2 F (36.2 C), temperature source Oral, resp. rate 18, height 5' (1.524 m), weight 201 lb (91.173 kg), SpO2 100.00%.  General: Well developed, well nourished, female in no acute distress. Looks older than her stated age Head: Eyes PERRLA, No xanthomas.   Normocephalic and atraumatic, oropharynx without edema or exudate. Lungs: crackles at bases Heart: HRRR S1 S2, + murmur. Tenderness to palpation over precordium Neck: No carotid bruits. No lymphadenopathy.  +JVD. Abdomen: Bowel sounds present, abdomen soft and non-tender without masses or hernias noted. Msk:  No  spine or cva tenderness. No weakness, no joint deformities or effusions.  Extremities: No clubbing or cyanosis. 2+ pitting  edema.  Neuro: Alert and oriented X 3. No focal deficits  noted. Psych:  Good affect, responds appropriately Skin: No rashes or lesions noted.  Labs:   Lab Results  Component Value Date   WBC 13.9* 07/12/2013   HGB 8.8* 07/12/2013   HCT 28.0* 07/12/2013   MCV 81.9 07/12/2013   PLT 471* 07/12/2013   No results found for this basename: INR,  in the last 72 hours  Recent Labs Lab 07/12/13 2232  NA 138  K 4.5  CL 99  CO2 23  BUN 36*  CREATININE 0.97  CALCIUM 10.7*  PROT 7.7  BILITOT <0.2*  ALKPHOS 107  ALT 17  AST 22  GLUCOSE 99  ALBUMIN 3.3*     Recent Labs  07/13/13 0730 07/13/13 1110  TROPONINI <0.30 <0.30    Recent Labs  07/12/13 2240  TROPIPOC 0.04   Pro B Natriuretic peptide (BNP)  Date/Time Value Ref Range Status  07/12/2013 10:32 PM 4136.0* 0 - 125 pg/mL Final  11/13/2012  1:34 PM 67.0  0 - 125 pg/mL Final    Lab Results  Component Value Date   DDIMER 2.21* 02/24/2013    Echo: pending  ECG:  On admission with AV dissociation with junctional escape rhythm  Radiology:  Dg Chest 2 View  07/12/2013   CLINICAL DATA:  Shortness of breath, low-grade fever.  EXAM: CHEST  2 VIEW  COMPARISON:  Chest radiograph May 19, 2013.  FINDINGS: The cardiac silhouette remains moderately enlarged, mediastinal silhouette is nonsuspicious. Stable chronic interstitial changes. Surgical clips in left hilum, and projecting in mid mediastinum. Central pulmonary vasculature congestion, without pleural effusions or focal consolidation. No pneumothorax appears similar scarring in left lung base.  Soft tissue planes and included osseous structures are nonsuspicious. Surgical clips in the right abdomen likely reflect cholecystectomy.  IMPRESSION: Stable cardiomegaly, central pulmonary vasculature congestion.   Electronically Signed   By: Elon Alas   On: 07/12/2013 23:54    2D ECHO: 07/13/2013 LV EF: 60% - 65% Study Conclusions - Left ventricle: The cavity size was normal. There was mild focal basal hypertrophy of the septum.  Systolic function was normal. The estimated ejection fraction was in the range of 60% to 65%. Wall motion was normal; there were no regional wall motion abnormalities. Features are consistent with a pseudonormal left ventricular filling pattern, with concomitant abnormal relaxation and increased filling pressure (grade 2 diastolic dysfunction). - Left atrium: The atrium was moderately dilated. - Pericardium, extracardiac: A small to moderate pericardial effusion was identified circumferential to the heart. There was no evidence of hemodynamic compromise. Impressions: - A small pericardial effusion was noted in prior study from 2007.   ASSESSMENT AND PLAN:    Principal Problem:   Pulmonary edema with congestive heart failure Active Problems:   CHF (congestive heart failure)   Congestive heart failure  [name] is a 63 y.o. female with a history of HTN, DM, morbid obesity, COPD on 2L home 02, liver cirrhosis, non-small lung cancer s/p L lobectomy, perforated colon due to diverticular disease with colostomy placement 2007, anemia due to chronic bleeding from colostomy bag, recent splenectomy in 01/2013, and most recently patellar fracture requiring ORIF 03/2013 who presented to [place] yesterday worsening shortness of breath, edema and low-grade fever x 2-3 days. She was found to be in CHF exacerbation and have AV dissociation on admission ECG.  New-onset acute diastolic CHF- BNP 4K, CXR with vascular congestion. -- Pulmonary edema with increase in weight and anasarca, last echo in 2007  -- Placed on Lasix 40 mg IV BID and NTG gtt. Neg 4 lbs and -4.8L -- ECHO today with an EF 60-65%, grade 2 DD, moderate LA dilation. Small to moderate pericardial effusion with no tamponade physiology. Of note, a small pericardial effusion was noted and a prior study from 2007 -- TSH pending   Chest pain- atypical -- Troponin neg x2  -- Will likely need an ischemic work up at some point.   -- Lexiscan  Myoview tomorrow morning. NPO after midnight.  R/O A-V dissociation- isorhythmic AV dissociation with sinus slowing vs junctional rhythm.  -- Now this has resolved. She is not on any negative ionotropic drugs. She is now back in sinus so we will continue to monitor.  -- Trazadone can cause bradycardia. We will discontinue this.   Anemia with history of bleeding from around the ostomy bag, chronic    Signed: Perry Mount, PA-C 07/13/2013 1:16 PM  Pager [phone]   Patient seen and examined. Agree with assessment and plan. Pt is a 63 yo WF who appears much older than stated age with a complex medical history as noted above. ECG from last night suggests junctional rhythm with T and U waves rather that CHB. Subsequent ECG has shown sinus rhythm. She  Is not on any significant negative chronotropic meds. BNP is elevated c/w CHF secondary to probable diastolic dysfunction. EF on echo 60-65% with Gr 2 diastolic dysfunction. Will continue iv diuresis. She has chest wall tenderness to palpation suggesting noncardiac chest pain but agree with noninvasive nuclear myoview assessment for potential ischemic etiology.   Troy Sine, MD, [facility] 07/13/2013 2:19 PM

## 2013-07-13 NOTE — Progress Notes (Signed)
Echocardiogram 2D Echocardiogram has been performed.  [name] 07/13/2013, 11:54 AM

## 2013-07-14 ENCOUNTER — Encounter (HOSPITAL_COMMUNITY): Payer: Medicare Other

## 2013-07-14 ENCOUNTER — Inpatient Hospital Stay (HOSPITAL_COMMUNITY): Payer: Medicare Other

## 2013-07-14 DIAGNOSIS — I5033 Acute on chronic diastolic (congestive) heart failure: Principal | ICD-10-CM

## 2013-07-14 DIAGNOSIS — I200 Unstable angina: Secondary | ICD-10-CM | POA: Diagnosis present

## 2013-07-14 DIAGNOSIS — E1149 Type 2 diabetes mellitus with other diabetic neurological complication: Secondary | ICD-10-CM

## 2013-07-14 DIAGNOSIS — R079 Chest pain, unspecified: Secondary | ICD-10-CM

## 2013-07-14 DIAGNOSIS — I4430 Unspecified atrioventricular block: Secondary | ICD-10-CM | POA: Diagnosis present

## 2013-07-14 DIAGNOSIS — K219 Gastro-esophageal reflux disease without esophagitis: Secondary | ICD-10-CM

## 2013-07-14 DIAGNOSIS — I10 Essential (primary) hypertension: Secondary | ICD-10-CM

## 2013-07-14 LAB — BASIC METABOLIC PANEL
BUN: 32 mg/dL — ABNORMAL HIGH (ref 6–23)
CHLORIDE: 99 meq/L (ref 96–112)
CO2: 27 meq/L (ref 19–32)
Calcium: 10.1 mg/dL (ref 8.4–10.5)
Creatinine, Ser: 0.97 mg/dL (ref 0.50–1.10)
GFR calc Af Amer: 72 mL/min — ABNORMAL LOW (ref >90)
GFR calc non Af Amer: 62 mL/min — ABNORMAL LOW (ref >90)
Glucose, Bld: 116 mg/dL — ABNORMAL HIGH (ref 70–99)
POTASSIUM: 4.3 meq/L (ref 3.7–5.3)
Sodium: 139 mEq/L (ref 137–147)

## 2013-07-14 LAB — GLUCOSE, CAPILLARY
GLUCOSE-CAPILLARY: 127 mg/dL — AB (ref 70–99)
GLUCOSE-CAPILLARY: 134 mg/dL — AB (ref 70–99)
GLUCOSE-CAPILLARY: 172 mg/dL — AB (ref 70–99)
Glucose-Capillary: 122 mg/dL — ABNORMAL HIGH (ref 70–99)

## 2013-07-14 MED ORDER — TECHNETIUM TC 99M SESTAMIBI - CARDIOLITE
10.0000 | Freq: Once | INTRAVENOUS | Status: AC | PRN
  Administered 2013-07-14: 10:00:00 10 via INTRAVENOUS

## 2013-07-14 MED ORDER — LOSARTAN POTASSIUM 50 MG PO TABS
50.0000 mg | ORAL_TABLET | Freq: Every day | ORAL | Status: DC
  Administered 2013-07-15 – 2013-07-17 (×3): 50 mg via ORAL
  Filled 2013-07-14 (×3): qty 1

## 2013-07-14 MED ORDER — LOSARTAN POTASSIUM 25 MG PO TABS: 25.0000 mg | ORAL_TABLET | Freq: Every day | ORAL | Status: DC

## 2013-07-14 MED ORDER — ISOSORB DINITRATE-HYDRALAZINE 20-37.5 MG PO TABS
1.0000 | ORAL_TABLET | Freq: Two times a day (BID) | ORAL | Status: DC
  Administered 2013-07-14 – 2013-07-17 (×7): 1 via ORAL
  Filled 2013-07-14 (×8): qty 1

## 2013-07-14 MED ORDER — POLYSACCHARIDE IRON COMPLEX 150 MG PO CAPS
150.0000 mg | ORAL_CAPSULE | Freq: Two times a day (BID) | ORAL | Status: DC
  Administered 2013-07-14 – 2013-07-17 (×7): 150 mg via ORAL
  Filled 2013-07-14 (×8): qty 1

## 2013-07-14 MED ORDER — TECHNETIUM TC 99M SESTAMIBI GENERIC - CARDIOLITE
30.0000 | Freq: Once | INTRAVENOUS | Status: AC | PRN
  Administered 2013-07-14: 30 via INTRAVENOUS

## 2013-07-14 MED ORDER — REGADENOSON 0.4 MG/5ML IV SOLN
INTRAVENOUS | Status: AC
  Administered 2013-07-14: 0.4 mg via INTRAVENOUS
  Filled 2013-07-14: qty 5

## 2013-07-14 NOTE — Progress Notes (Signed)
Per Nuclear medicine, pt cannot be on Nitro gtt for test. MD on call notified via amion textpage to verify whether nitrogtt can be paused for test. Will continue to monitor.

## 2013-07-14 NOTE — Progress Notes (Signed)
[facility] PROGRESS NOTE Interim History: 63 y.o. female, past medical history significant for hypertension diabetes mellitus COPD liver cirrhosis and non-small lung cancer presenting with few days history of worsening shortness of breath, swelling and low-grade fever her primary medical doctor increased her Lasix to 40 mg daily however she still continued to swell . No nausea or vomiting although complains of bouts of diarrhea times. She also complains of pain all over including her left knee which she injured on March of this year, chest pain and abdominal pain . The patient was found to congestive heart failure clinically and per chest x-ray and BNP and I was called to admit. To note also that the patient had an EKG on admission that showed A-V dissociation that resolved on the second EKG. Home health nurse reports increase in weight from 177 pounds to 205 pounds in a few weeks.     Filed Weights   07/12/13 2247 07/13/13 0624 07/14/13 0505  Weight: 92.987 kg (205 lb) 91.173 kg (201 lb) 88.769 kg (195 lb 11.2 oz)        Intake/Output Summary (Last 24 hours) at 07/14/13 1410 Last data filed at 07/14/13 1406  Gross per 24 hour  Intake  288.9 ml  Output   4500 ml  Net -4211.1 ml     Assessment/Plan: 1-acute on chronic resp failure: due to CHF exacerbation. -continue oxygen supplementation -continue IV lasix -breathing improving, but still with some extra fluid on board -cardiology consulted and s/p myoview on 6/17; will follow results -follow electrolytes and replete as needed  2-accelerated HTN: resolved with NTG drip -will stop NTG drip and will start bidil -will also add cozaar 50mg  daily -lasix will help with BP as well -advise to follow low sodium diet  3-AV block- transient AVD: resolved spontaneously; will monitor  4-Unstable angina: s/p myoview; will follow rec's from cardiology -no B-blocker due to COPD -continue cozaar and bidil -continue ASA  5-chronic  resp failure due to COPD: continue chronic oxygen supplementation and home inhalers -no wheezing currently    *Acute diastolic heart failure  6-Type II or unspecified type diabetes mellitus with neurological manifestations, uncontrolled: will continue low carb diet and current hypoglycemic regimen -CBG's stable.  7-anemia: Hgb stable. Will continue niferex BID  DVT: SCD's     Code Status: Full Family Communication: no family at bedside  Disposition Plan: home when medically stable    Consultants:  Cardiology   Procedures: ECHO: 07/12/13 - Left ventricle: The cavity size was normal. There was mild focal basal hypertrophy of the septum. Systolic function was normal. The estimated ejection fraction was in the range of 60% to 65%. Wall motion was normal; there were no regional wall motion abnormalities. Features are consistent with a pseudonormal left ventricular filling pattern, with concomitant abnormal relaxation and increased filling pressure (grade 2 diastolic dysfunction). - Left atrium: The atrium was moderately dilated. - Pericardium, extracardiac: A small to moderate pericardial effusion was identified circumferential to the heart. There was no evidence of hemodynamic compromise.  Antibiotics:  None   HPI/Subjective: Feeling better and breathing better; denies CP.  Objective: Filed Vitals:   07/14/13 1047 07/14/13 1107 07/14/13 1109 07/14/13 1111  BP: 161/43 143/44 151/47 150/47  Pulse: 75 87 84 82  Temp:      TempSrc:      Resp:      Height:      Weight:      SpO2:         Exam:  General: Alert, awake, oriented x3, in no acute distress; afebrile and breathing a lot better HEENT: No bruits, no goiter; no JVD Heart: Regular rate; no rubs or gallops; 1-2++ edema bilaterally Lungs: improved air movement, fine crackles on exam Abdomen: Soft, nontender, nondistended, positive bowel sounds.  Neuro: Grossly intact, nonfocal.   Data Reviewed: Basic  Metabolic Panel:  Recent Labs Lab 07/12/13 2232 07/14/13 0530  NA 138 139  K 4.5 4.3  CL 99 99  CO2 23 27  GLUCOSE 99 116*  BUN 36* 32*  CREATININE 0.97 0.97  CALCIUM 10.7* 10.1   Liver Function Tests:  Recent Labs Lab 07/12/13 2232  AST 22  ALT 17  ALKPHOS 107  BILITOT <0.2*  PROT 7.7  ALBUMIN 3.3*   CBC:  Recent Labs Lab 07/12/13 2232  WBC 13.9*  NEUTROABS 7.6  HGB 8.8*  HCT 28.0*  MCV 81.9  PLT 471*   Cardiac Enzymes:  Recent Labs Lab 07/13/13 0730 07/13/13 1110 07/13/13 1853  TROPONINI <0.30 <0.30 <0.30   BNP (last 3 results)  Recent Labs  11/13/12 1334 07/12/13 2232  PROBNP 67.0 4136.0*   CBG:  Recent Labs Lab 07/13/13 1654 07/13/13 2145 07/13/13 2353 07/14/13 0536 07/14/13 1236  GLUCAP 166* 139* 187* 127* 134*    Studies: Dg Chest 2 View  07/12/2013   CLINICAL DATA:  Shortness of breath, low-grade fever.  EXAM: CHEST  2 VIEW  COMPARISON:  Chest radiograph May 19, 2013.  FINDINGS: The cardiac silhouette remains moderately enlarged, mediastinal silhouette is nonsuspicious. Stable chronic interstitial changes. Surgical clips in left hilum, and projecting in mid mediastinum. Central pulmonary vasculature congestion, without pleural effusions or focal consolidation. No pneumothorax appears similar scarring in left lung base.  Soft tissue planes and included osseous structures are nonsuspicious. Surgical clips in the right abdomen likely reflect cholecystectomy.  IMPRESSION: Stable cardiomegaly, central pulmonary vasculature congestion.   Electronically Signed   By: Elon Alas   On: 07/12/2013 23:54    Scheduled Meds: . dicyclomine  10 mg Oral TID  . folic acid  1 mg Oral QPM  . furosemide  40 mg Intravenous BID  . insulin aspart  0-20 Units Subcutaneous TID WC  . insulin aspart  0-5 Units Subcutaneous QHS  . insulin aspart  4 Units Subcutaneous TID WC  . insulin glargine  60 Units Subcutaneous BID  . iron polysaccharides  150  mg Oral BID  . isosorbide-hydrALAZINE  1 tablet Oral BID  . [START ON 07/15/2013] losartan  50 mg Oral Daily  . metFORMIN  1,000 mg Oral BID WC  . metoCLOPramide  10 mg Oral TID WC  . mometasone-formoterol  2 puff Inhalation BID  . pantoprazole  40 mg Oral Daily  . rifaximin  550 mg Oral BID  . sodium chloride  3 mL Intravenous Q12H  . sodium chloride  3 mL Intravenous Q12H   Continuous Infusions:   Time: > 30 minutes  Barton Dubois  [facility] Pager [phone]. If 8PM-8AM, please contact night-coverage at [email or web address], password [facility] 07/14/2013, 2:10 PM  LOS: 1 day     **Disclaimer: This note may have been dictated with voice recognition software. Similar sounding words can inadvertently be transcribed and this note may contain transcription errors which may not have been corrected upon publication of note.**

## 2013-07-14 NOTE — Progress Notes (Signed)
    Subjective:  Some chest pain overnight- "not bad"  Objective:  Vital Signs in the last 24 hours: Temp:  [97.2 F (36.2 C)-98.2 F (36.8 C)] 97.9 F (36.6 C) (06/17 0505) Pulse Rate:  [72-87] 82 (06/17 1111) Resp:  [18] 18 (06/17 0505) BP: (137-165)/(43-68) 150/47 mmHg (06/17 1111) SpO2:  [98 %-100 %] 98 % (06/17 0821) Weight:  [88.769 kg (195 lb 11.2 oz)] 88.769 kg (195 lb 11.2 oz) (06/17 0505)  Intake/Output from previous day:  Intake/Output Summary (Last 24 hours) at 07/14/13 1126 Last data filed at 07/14/13 0900  Gross per 24 hour  Intake    240 ml  Output   4500 ml  Net  -4260 ml    Physical Exam: General appearance: alert, cooperative, no distress and moderately obese Lungs: clear to auscultation bilaterally Heart: regular rate and rhythm   Rate: 75  Rhythm: normal sinus rhythm  Lab Results:  Recent Labs  07/12/13 2232  WBC 13.9*  HGB 8.8*  PLT 471*    Recent Labs  07/12/13 2232 07/14/13 0530  NA 138 139  K 4.5 4.3  CL 99 99  CO2 23 27  GLUCOSE 99 116*  BUN 36* 32*  CREATININE 0.97 0.97    Recent Labs  07/13/13 1110 07/13/13 1853  TROPONINI <0.30 <0.30   No results found for this basename: INR,  in the last 72 hours  Imaging: Imaging results have been reviewed  Cardiac Studies: Echo 07/13/13 - Left ventricle: The cavity size was normal. There was mild focal basal hypertrophy of the septum. Systolic function was normal. The estimated ejection fraction was in the range of 60% to 65%. Wall motion was normal; there were no regional wall motion abnormalities. Features are consistent with a pseudonormal left ventricular filling pattern, with concomitant abnormal relaxation and increased filling pressure (grade 2 diastolic dysfunction). - Left atrium: The atrium was moderately dilated. - Pericardium, extracardiac: A small to moderate pericardial effusion was identified circumferential to the heart. There was no evidence of hemodynamic  compromise.  Impressions:  - A small pericardial effusion was noted in prior study from 2007.    Assessment/Plan:   63 y.o. 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 She was found to be in CHF and had transient AV dissociation on admission ECG.   Principal Problem:   Acute diastolic heart failure Active Problems:   Unstable angina   Type II or unspecified type diabetes mellitus with neurological manifestations, uncontrolled   AV block- transient AVD   CARCINOMA, LUNG, SQUAMOUS CELL   HYPERTENSION   COPD    PLAN: Myoview today, Troponin negative, I/O negative 8 L total. Consider changing Lasix to po and decreasing dose. Add ARB with HTN, DM., and COPD.  Kerin Ransom PA-C Beeper [phone] 07/14/2013, 11:26 AM

## 2013-07-14 NOTE — Progress Notes (Addendum)
SUBJECTIVE:  No complaints  OBJECTIVE:   Vitals:   Filed Vitals:   07/14/13 1047 07/14/13 1107 07/14/13 1109 07/14/13 1111  BP: 161/43 143/44 151/47 150/47  Pulse: 75 87 84 82  Temp:      TempSrc:      Resp:      Height:      Weight:      SpO2:       I&O's:   Intake/Output Summary (Last 24 hours) at 07/14/13 1126 Last data filed at 07/14/13 0900  Gross per 24 hour  Intake    240 ml  Output   4500 ml  Net  -4260 ml   TELEMETRY: Reviewed telemetry pt in NSR:     PHYSICAL EXAM General: Well developed, well nourished, in no acute distress Head: Eyes PERRLA, No xanthomas.   Normal cephalic and atramatic  Lungs:   Clear bilaterally to auscultation and percussion. Heart:   HRRR S1 S2 Pulses are 2+ & equal. Abdomen: Bowel sounds are positive, abdomen soft and non-tender without masses  Extremities:   No clubbing, cyanosis or edema.  DP +1 Neuro: Alert and oriented X 3. Psych:  Good affect, responds appropriately   LABS: Basic Metabolic Panel:  Recent Labs  07/12/13 2232 07/14/13 0530  NA 138 139  K 4.5 4.3  CL 99 99  CO2 23 27  GLUCOSE 99 116*  BUN 36* 32*  CREATININE 0.97 0.97  CALCIUM 10.7* 10.1   Liver Function Tests:  Recent Labs  07/12/13 2232  AST 22  ALT 17  ALKPHOS 107  BILITOT <0.2*  PROT 7.7  ALBUMIN 3.3*   No results found for this basename: LIPASE, AMYLASE,  in the last 72 hours CBC:  Recent Labs  07/12/13 2232  WBC 13.9*  NEUTROABS 7.6  HGB 8.8*  HCT 28.0*  MCV 81.9  PLT 471*   Cardiac Enzymes:  Recent Labs  07/13/13 0730 07/13/13 1110 07/13/13 1853  TROPONINI <0.30 <0.30 <0.30   BNP: No components found with this basename: POCBNP,  D-Dimer: No results found for this basename: DDIMER,  in the last 72 hours Hemoglobin A1C: No results found for this basename: HGBA1C,  in the last 72 hours Fasting Lipid Panel: No results found for this basename: CHOL, HDL, LDLCALC, TRIG, CHOLHDL, LDLDIRECT,  in the last 72  hours Thyroid Function Tests: No results found for this basename: TSH, T4TOTAL, FREET3, T3FREE, THYROIDAB,  in the last 72 hours Anemia Panel: No results found for this basename: VITAMINB12, FOLATE, FERRITIN, TIBC, IRON, RETICCTPCT,  in the last 72 hours Coag Panel:   Lab Results  Component Value Date   INR 1.05 05/19/2013   INR 1.00 04/21/2013   INR 1.29 02/27/2013    RADIOLOGY: Dg Chest 2 View  07/12/2013   CLINICAL DATA:  Shortness of breath, low-grade fever.  EXAM: CHEST  2 VIEW  COMPARISON:  Chest radiograph May 19, 2013.  FINDINGS: The cardiac silhouette remains moderately enlarged, mediastinal silhouette is nonsuspicious. Stable chronic interstitial changes. Surgical clips in left hilum, and projecting in mid mediastinum. Central pulmonary vasculature congestion, without pleural effusions or focal consolidation. No pneumothorax appears similar scarring in left lung base.  Soft tissue planes and included osseous structures are nonsuspicious. Surgical clips in the right abdomen likely reflect cholecystectomy.  IMPRESSION: Stable cardiomegaly, central pulmonary vasculature congestion.   Electronically Signed   By: Elon Alas   On: 07/12/2013 23:54   ASSESSMENT AND PLAN:  Principal Problem:  Pulmonary edema with congestive heart  failure  Active Problems:  CHF (congestive heart failure)  Congestive heart failure   [name] is a 63 y.o. 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 She was found to be in CHF exacerbation and have AV dissociation on admission ECG.  New-onset acute diastolic  CHF- BNP 4K, CXR with vascular congestion.  -- Pulmonary edema with increase in weight and anasarca, last echo in 2007  -- Placed on Lasix 40 mg IV BID and NTG gtt. Neg 6 lbs and -8L.  OK to change to PO Lasix.  Stop NTG gtt. -- ECHO  with an EF 60-65%, grade 2 DD, moderate LA dilation. Small to moderate pericardial effusion with no tamponade physiology. Of note, a small pericardial effusion was noted and a prior study from 2007  -- TSH pending  Chest pain- atypical  -- Troponin neg x3   -- Lexiscan Myoview today R/O A-V dissociation- isorhythmic AV dissociation with sinus slowing vs junctional rhythm.  -- Now this has resolved. She is not on any negative ionotropic drugs. She is now back in sinus so we will continue to monitor.  -- Trazadone can cause bradycardia. We will discontinue this.  Anemia with history of bleeding from around the ostomy bag, chronic     Sueanne Margarita, MD  07/14/2013  11:26 AM

## 2013-07-14 NOTE — Progress Notes (Signed)
Patient seen and examined and agree with note as outlined by Kerin Ransom, PA-C

## 2013-07-15 ENCOUNTER — Telehealth: Payer: Self-pay | Admitting: Pulmonary Disease

## 2013-07-15 DIAGNOSIS — D539 Nutritional anemia, unspecified: Secondary | ICD-10-CM

## 2013-07-15 DIAGNOSIS — R0902 Hypoxemia: Secondary | ICD-10-CM

## 2013-07-15 LAB — GLUCOSE, CAPILLARY
GLUCOSE-CAPILLARY: 85 mg/dL (ref 70–99)
Glucose-Capillary: 87 mg/dL (ref 70–99)
Glucose-Capillary: 109 mg/dL — ABNORMAL HIGH (ref 70–99)
Glucose-Capillary: 138 mg/dL — ABNORMAL HIGH (ref 70–99)

## 2013-07-15 NOTE — Progress Notes (Signed)
[facility] PROGRESS NOTE Interim History: 63 y.o. female, past medical history significant for hypertension diabetes mellitus COPD liver cirrhosis and non-small lung cancer presenting with few days history of worsening shortness of breath, swelling and low-grade fever her primary medical doctor increased her Lasix to 40 mg daily however she still continued to swell . No nausea or vomiting although complains of bouts of diarrhea times. She also complains of pain all over including her left knee which she injured on March of this year, chest pain and abdominal pain . The patient was found to congestive heart failure clinically and per chest x-ray and BNP and I was called to admit. To note also that the patient had an EKG on admission that showed A-V dissociation that resolved on the second EKG. Home health nurse reports increase in weight from 177 pounds to 205 pounds in a few weeks.    Filed Weights   07/13/13 0624 07/14/13 0505 07/15/13 0526  Weight: 91.173 kg (201 lb) 88.769 kg (195 lb 11.2 oz) 86.183 kg (190 lb)        Intake/Output Summary (Last 24 hours) at 07/15/13 1539 Last data filed at 07/15/13 1406  Gross per 24 hour  Intake   1200 ml  Output   5350 ml  Net  -4150 ml     Assessment/Plan: 1-acute on chronic resp failure: due to acute on chronic diastolic CHF exacerbation. -continue oxygen supplementation -continue IV lasix; breathing improving, but still with some extra fluid on board -cardiology consulted and s/p myoview on 6/17 (no acute ischemic process appreciated) -follow electrolytes and replete as needed -will follow daily weight and strict I's and O's -repeat BNP in am -continue controlling BP  2-accelerated HTN: resolved with NTG drip -BP stable and well controlled now -will continue cozaar and bidil -lasix will help with BP as well -advise to follow low sodium diet at discharge  3-AV block- transient AVD: resolved spontaneously; will monitor on  tele  4-presumed Unstable angina: s/p myoview w/o ischemic abnormalities; will follow rec's from cardiology -no B-blocker due to COPD -continue cozaar and bidil -continue ASA  5-chronic resp failure due to COPD: continue chronic oxygen supplementation and home inhalers -no wheezing currently and good O2 sat   6-Type II or unspecified type diabetes mellitus with neurological manifestations, uncontrolled: will continue low carb diet and current hypoglycemic regimen -CBG's stable.  7-anemia: Hgb stable. Will continue niferex BID  DVT: SCD's     Code Status: Full Family Communication: no family at bedside  Disposition Plan: home when medically stable    Consultants:  Cardiology   Procedures: ECHO: 07/12/13 - Left ventricle: The cavity size was normal. There was mild focal basal hypertrophy of the septum. Systolic function was normal. The estimated ejection fraction was in the range of 60% to 65%. Wall motion was normal; there were no regional wall motion abnormalities. Features are consistent with a pseudonormal left ventricular filling pattern, with concomitant abnormal relaxation and increased filling pressure (grade 2 diastolic dysfunction). - Left atrium: The atrium was moderately dilated. - Pericardium, extracardiac: A small to moderate pericardial effusion was identified circumferential to the heart. There was no evidence of hemodynamic compromise.   Myoview nuclear stress test: no ischemia   Antibiotics:  None   HPI/Subjective: Denies CP; breathing continue improving; but still with some orthopnea and dyspnea with exertion. Patient also with 1-2++ edema bilaterally  Objective: Filed Vitals:   07/14/13 2004 07/14/13 2258 07/15/13 0526 07/15/13 1423  BP: 126/50 129/49 154/61  133/53  Pulse: 78  81 79  Temp: 97.2 F (36.2 C)  98.3 F (36.8 C) 97.9 F (36.6 C)  TempSrc: Oral  Oral Oral  Resp: 18  18   Height:      Weight:   86.183 kg (190 lb)   SpO2: 100%   99% 98%     Exam:  General: Alert, awake, oriented x3, in no acute distress; afebrile and with continue improvement in her breathing HEENT: No bruits, no goiter; no JVD Heart: Regular rate; no rubs or gallops; 1-2++ edema bilaterally Lungs: improved air movement, no frank crackles on exam Abdomen: Soft, nontender, nondistended, positive bowel sounds.  Neuro: Grossly intact, nonfocal.   Data Reviewed: Basic Metabolic Panel:  Recent Labs Lab 07/12/13 2232 07/14/13 0530  NA 138 139  K 4.5 4.3  CL 99 99  CO2 23 27  GLUCOSE 99 116*  BUN 36* 32*  CREATININE 0.97 0.97  CALCIUM 10.7* 10.1   Liver Function Tests:  Recent Labs Lab 07/12/13 2232  AST 22  ALT 17  ALKPHOS 107  BILITOT <0.2*  PROT 7.7  ALBUMIN 3.3*   CBC:  Recent Labs Lab 07/12/13 2232  WBC 13.9*  NEUTROABS 7.6  HGB 8.8*  HCT 28.0*  MCV 81.9  PLT 471*   Cardiac Enzymes:  Recent Labs Lab 07/13/13 0730 07/13/13 1110 07/13/13 1853  TROPONINI <0.30 <0.30 <0.30   BNP (last 3 results)  Recent Labs  11/13/12 1334 07/12/13 2232  PROBNP 67.0 4136.0*   CBG:  Recent Labs Lab 07/14/13 1236 07/14/13 1623 07/14/13 2102 07/15/13 0617 07/15/13 1110  GLUCAP 134* 172* 122* 87 109*    Studies: Nm Myocar Multi W/spect W/wall Motion / Ef  07/14/2013   CLINICAL DATA:  63 year old with atypical chest pain.  EXAM: MYOCARDIAL IMAGING WITH SPECT (PHARMACOLOGIC-STRESS)  GATED LEFT VENTRICULAR WALL MOTION STUDY  LEFT VENTRICULAR EJECTION FRACTION  TECHNIQUE: Intravenous infusion of Lexiscan was performed under the supervision of the Cardiology staff. At peak effect of the drug, 10 mCi Tc-50m sestamibi was injected intravenously and standard myocardial SPECT imaging was performed. Quantitative gated imaging was also performed to evaluate left ventricular wall motion, and estimate left ventricular ejection fraction.  COMPARISON:  None.  FINDINGS: There was mild breast attenuation. There was subtle decreased  uptake along the distal anterior wall portion/apical portion at both rest and stress consistent with breast attenuation artifact. There was no evidence of ischemia. Otherwise, homogeneous radiotracer uptake. Ejection fraction was 63% with normal wall motion abnormality.  IMPRESSION: Low risk nuclear stress test, pharmacologic with no evidence of ischemia. Normal ejection fraction of 63%.   Electronically Signed   By: Candee Furbish   On: 07/14/2013 14:39    Scheduled Meds: . dicyclomine  10 mg Oral TID  . folic acid  1 mg Oral QPM  . furosemide  40 mg Intravenous BID  . insulin aspart  0-20 Units Subcutaneous TID WC  . insulin aspart  0-5 Units Subcutaneous QHS  . insulin aspart  4 Units Subcutaneous TID WC  . insulin glargine  60 Units Subcutaneous BID  . iron polysaccharides  150 mg Oral BID  . isosorbide-hydrALAZINE  1 tablet Oral BID  . losartan  50 mg Oral Daily  . metFORMIN  1,000 mg Oral BID WC  . metoCLOPramide  10 mg Oral TID WC  . mometasone-formoterol  2 puff Inhalation BID  . pantoprazole  40 mg Oral Daily  . rifaximin  550 mg Oral BID  . sodium  chloride  3 mL Intravenous Q12H   Continuous Infusions:   Time: > 30 minutes  Barton Dubois  [facility] Pager [phone]. If 8PM-8AM, please contact night-coverage at [email or web address], password [facility] 07/15/2013, 3:39 PM  LOS: 2 days     **Disclaimer: This note may have been dictated with voice recognition software. Similar sounding words can inadvertently be transcribed and this note may contain transcription errors which may not have been corrected upon publication of note.**

## 2013-07-15 NOTE — Telephone Encounter (Signed)
I spoke with [name] because the pt is currently hospitalized. She states that the pt is needing re qualifying sats and this is ok to wait until pt comes in for HFU in the future. [place] Bing, CMA

## 2013-07-15 NOTE — Progress Notes (Signed)
Patient Profile: [name] is a 63 y.o. 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 She was found to be in CHF exacerbation and have AV dissociation on admission ECG.    Subjective: Still complains of orthopnea and DOE (SOB ambulating short distances from bed to bathroom). She remains on 2.5L of supplemental O2 (on 2L at home).   Objective: Vital signs in last 24 hours: Temp:  [97.2 F (36.2 C)-98.3 F (36.8 C)] 98.3 F (36.8 C) (06/18 0526) Pulse Rate:  [73-87] 81 (06/18 0526) Resp:  [18-20] 18 (06/18 0526) BP: (126-161)/(43-61) 154/61 mmHg (06/18 0526) SpO2:  [99 %-100 %] 99 % (06/18 0526) Weight:  [190 lb (86.183 kg)] 190 lb (86.183 kg) (06/18 0526) Last BM Date: 07/15/13  Intake/Output from previous day: 06/17 0701 - 06/18 0700 In: 648.9 [P.O.:600; I.V.:48.9] Out: 5100 [Urine:5100] Intake/Output this shift: Total I/O In: -  Out: 450 [Urine:450]  Medications Current Facility-Administered Medications  Medication Dose Route Frequency Provider Last Rate Last Dose  . 0.9 %  sodium chloride infusion  250 mL Intravenous PRN Merton Border, MD      . acetaminophen (TYLENOL) tablet 650 mg  650 mg Oral Q6H PRN Merton Border, MD       Or  . acetaminophen (TYLENOL) suppository 650 mg  650 mg Rectal Q6H PRN Merton Border, MD      . alum & mag hydroxide-simeth (MAALOX/MYLANTA) 200-200-20 MG/5ML suspension 30 mL  30 mL Oral Q6H PRN Merton Border, MD      . diazepam (VALIUM) tablet 5 mg  5 mg Oral TID PRN Merton Border, MD      . dicyclomine (BENTYL) capsule 10 mg  10 mg Oral TID Merton Border, MD   10 mg at  07/14/13 2105  . folic acid (FOLVITE) tablet 1 mg  1 mg Oral QPM Merton Border, MD   1 mg at 07/14/13 1721  . furosemide (LASIX) injection 40 mg  40 mg Intravenous BID Merton Border, MD   40 mg at 07/15/13 4854  . HYDROmorphone (DILAUDID) injection 1 mg  1 mg Intravenous Q4H PRN Merton Border, MD   1 mg at 07/14/13 1839  . insulin aspart (novoLOG) injection 0-20 Units  0-20 Units Subcutaneous TID WC Merton Border, MD   4 Units at 07/14/13 1754  . insulin aspart (novoLOG) injection 0-5 Units  0-5 Units Subcutaneous QHS Merton Border, MD      . insulin aspart (novoLOG) injection 4 Units  4 Units Subcutaneous TID WC Merton Border, MD   4 Units at 07/15/13 [id number]  . insulin glargine (LANTUS) injection 60 Units  60 Units Subcutaneous BID Merton Border, MD   60 Units at 07/14/13 2106  . ipratropium-albuterol (DUONEB) 0.5-2.5 (3) MG/3ML nebulizer solution 3 mL  3 mL Inhalation Q4H PRN Merton Border, MD      . iron polysaccharides (NIFEREX) capsule 150 mg  150 mg Oral BID Barton Dubois, MD   150 mg at 07/14/13 2107  . isosorbide-hydrALAZINE (BIDIL) 20-37.5 MG per tablet 1 tablet  1 tablet Oral BID Barton Dubois, MD   1 tablet at 07/14/13 2256  . losartan (COZAAR) tablet 50  mg  50 mg Oral Daily Barton Dubois, MD      . metFORMIN (GLUCOPHAGE) tablet 1,000 mg  1,000 mg Oral BID WC Merton Border, MD   1,000 mg at 07/15/13 0610  . methocarbamol (ROBAXIN) tablet 500 mg  500 mg Oral Q6H PRN Merton Border, MD      . metoCLOPramide (REGLAN) tablet 10 mg  10 mg Oral TID WC Merton Border, MD   10 mg at 07/15/13 0610  . mometasone-formoterol (DULERA) 100-5 MCG/ACT inhaler 2 puff  2 puff Inhalation BID Merton Border, MD   2 puff at 07/14/13 1932  . ondansetron (ZOFRAN) tablet 4 mg  4 mg Oral Q6H PRN Merton Border, MD       Or  . ondansetron (ZOFRAN) injection 4 mg  4 mg Intravenous Q6H PRN Merton Border, MD      . oxyCODONE (Oxy IR/ROXICODONE) immediate release tablet 20 mg  20 mg Oral Q3H PRN Merton Border, MD   20 mg at 07/15/13 0610  . pantoprazole (PROTONIX)  EC tablet 40 mg  40 mg Oral Daily Merton Border, MD   40 mg at 07/14/13 1242  . promethazine (PHENERGAN) tablet 25 mg  25 mg Oral Q6H PRN Merton Border, MD      . rifaximin Doreene Nest) tablet 550 mg  550 mg Oral BID Merton Border, MD   550 mg at 07/14/13 2105  . sodium chloride 0.9 % injection 3 mL  3 mL Intravenous Q12H Merton Border, MD   3 mL at 07/14/13 2256  . sodium chloride 0.9 % injection 3 mL  3 mL Intravenous PRN Merton Border, MD        PE: General appearance: alert, cooperative, no distress and moderately obese Neck: mild JVD Lungs: clear to auscultation bilaterally Heart: regular rate and rhythm Extremities: 3+ bilateral LEE pitting edema Pulses: 2+ and symmetric Skin: warm and dry Neurologic: Grossly normal  Lab Results:   Recent Labs  07/12/13 2232  WBC 13.9*  HGB 8.8*  HCT 28.0*  PLT 471*   BMET  Recent Labs  07/12/13 2232 07/14/13 0530  NA 138 139  K 4.5 4.3  CL 99 99  CO2 23 27  GLUCOSE 99 116*  BUN 36* 32*  CREATININE 0.97 0.97  CALCIUM 10.7* 10.1    Studies/Results:  NST 07/14/13 FINDINGS: There was mild breast attenuation. There was subtle decreased uptake along the distal anterior wall portion/apical portion at both rest and stress consistent with breast attenuation artifact. There was no evidence of ischemia. Otherwise, homogeneous radiotracer uptake. Ejection fraction was 63% with normal wall motion abnormality.  IMPRESSION: Low risk nuclear stress test, pharmacologic with no evidence of ischemia. Normal ejection fraction of 63%.   Filed Weights   07/13/13 0624 07/14/13 0505 07/15/13 0526  Weight: 201 lb (91.173 kg) 195 lb 11.2 oz (88.769 kg) 190 lb (86.183 kg)   Assessment/Plan  Principal Problem:   Acute diastolic heart failure Active Problems:   CARCINOMA, LUNG, SQUAMOUS CELL   Type II or unspecified type diabetes mellitus with neurological manifestations, uncontrolled   HYPERTENSION   COPD   AV block- transient AVD   Unstable  angina  1. New-onset acute diastolic CHF- BNP 4K, CXR with vascular congestion.  -- Pulmonary edema with increase in weight and anasarca, last echo in 2007  -- ECHO with an EF 60-65%, grade 2 DD, moderate LA dilation. Small to moderate pericardial effusion with no tamponade physiology. Of note, a small pericardial effusion was noted and a prior  study from 2007  -- Net negative 12L overall and weight is down 5 lb in last 24 hrs -- Still with significant peripheral edema, DOE and orthopnea.  --Will need further diuresis with IV Lasix. Renal function and BP have been stable. -- Continue strict I/Os and daily weights -- Monitor renal function and electrolytes. Replete if needed.    2. Chest pain- atypical  -- Troponin neg x3  -- Lexiscan Myoview normal w/o evidence of ischemia. EF 63%.    3. R/O A-V dissociation- isorhythmic AV dissociation with sinus slowing vs junctional rhythm.  -- Now this has resolved. She is not on any negative ionotropic drugs. She is now back in sinus so we will continue to monitor.  -- Trazadone has been discontinued.  4. Anemia with history of bleeding from around the ostomy bag, chronic     LOS: 2 days    Brittainy M. Ladoris Gene 07/15/2013 8:35 AM

## 2013-07-15 NOTE — Progress Notes (Signed)
Patient seen and examined and agree with note as outlined by Sharlett Iles PA-C.  She remains volume overloaded despite dropping 15lbs since admission.  She is diuresing very well and will continue IV Lasix for now.  Renal function is stable.  No evidence of ischemia on nuclear stress test.

## 2013-07-16 ENCOUNTER — Ambulatory Visit: Payer: Medicare Other | Admitting: Pulmonary Disease

## 2013-07-16 LAB — BASIC METABOLIC PANEL
BUN: 31 mg/dL — AB (ref 6–23)
CO2: 29 mEq/L (ref 19–32)
Calcium: 10.5 mg/dL (ref 8.4–10.5)
Chloride: 96 mEq/L (ref 96–112)
Creatinine, Ser: 1.07 mg/dL (ref 0.50–1.10)
GFR, EST AFRICAN AMERICAN: 64 mL/min — AB (ref >90)
GFR, EST NON AFRICAN AMERICAN: 55 mL/min — AB (ref >90)
Glucose, Bld: 89 mg/dL (ref 70–99)
Potassium: 4.3 mEq/L (ref 3.7–5.3)
Sodium: 139 mEq/L (ref 137–147)

## 2013-07-16 LAB — GLUCOSE, CAPILLARY
GLUCOSE-CAPILLARY: 94 mg/dL (ref 70–99)
GLUCOSE-CAPILLARY: 140 mg/dL — AB (ref 70–99)
Glucose-Capillary: 63 mg/dL — ABNORMAL LOW (ref 70–99)
Glucose-Capillary: 91 mg/dL (ref 70–99)
Glucose-Capillary: 75 mg/dL (ref 70–99)

## 2013-07-16 LAB — PRO B NATRIURETIC PEPTIDE: Pro B Natriuretic peptide (BNP): 933.6 pg/mL — ABNORMAL HIGH (ref 0–125)

## 2013-07-16 MED ORDER — INSULIN GLARGINE 100 UNIT/ML ~~LOC~~ SOLN
40.0000 [IU] | Freq: Two times a day (BID) | SUBCUTANEOUS | Status: DC
  Administered 2013-07-16 – 2013-07-17 (×3): 40 [IU] via SUBCUTANEOUS
  Filled 2013-07-16 (×5): qty 0.4

## 2013-07-16 MED ORDER — INSULIN GLARGINE 100 UNIT/ML ~~LOC~~ SOLN
40.0000 [IU] | Freq: Two times a day (BID) | SUBCUTANEOUS | Status: DC
  Filled 2013-07-16: qty 0.4

## 2013-07-16 MED ORDER — COLLAGENASE 250 UNIT/GM EX OINT
TOPICAL_OINTMENT | Freq: Every day | CUTANEOUS | Status: DC
  Administered 2013-07-16: 1 via TOPICAL
  Administered 2013-07-17: 11:00:00 via TOPICAL
  Filled 2013-07-16: qty 30

## 2013-07-16 NOTE — Progress Notes (Signed)
Patient evaluated for community based chronic disease management services with [facility] as a benefit of patient's [employer]. Spoke with patient at bedside to explain [facility] services. Written consent for services received.  Her authorized contact is her daughter Vonzell Schlatter [phone] and Daughter is law Delane Ginger [phone].  Patient will receive a post discharge transition of care call and will be evaluated for monthly home visits for assessments and disease process education.  Left contact information and [facility] literature at bedside. Made Inpatient Case Manager aware that [place] Management following. Of note, [facility] Management services does not replace or interfere with any services that are arranged by inpatient case management or social work.  For additional questions or referrals please contact Corliss Blacker BSN RN [facility] Liaison at [phone].

## 2013-07-16 NOTE — Consult Note (Signed)
WOC wound consult note Reason for Consult:Left knee traumatic injury, in March.  Patient is seen by [facility] when at home.  Patient also with stoma (LLQ Colostomy) which which she is independent.  I will order supplies for her use while an inpatient. Wound type:Chronic traumaatic wound.  S/P fall in March 2015 with fracture of patella Pressure Ulcer POA: No Measurement: 1cm x 3.5cm with depth indeterminate due to the presence of soft tan eschar Wound bed:As above. Drainage (amount, consistency, odor) None Periwound:mild erythema to 2cm circumferentially Dressing procedure/placement/frequency: Currently the POC is an alginate dressing and a foam.  As there is no exudate and instead, eschar, I will implement an enzymatic debriding agent (Santyl, collagenase) to be applied daily.  Guidance is provided for the nursing staff regarding care of this wound. Patient to resume care with [facility] using new orders until MD follow up post discharge. [place] nursing team will not follow, but will remain available to this patient, the nursing and medical teams.  Please re-consult if needed. Thanks, Maudie Flakes, MSN, RN, [place], [place], [place] [phone])

## 2013-07-16 NOTE — Progress Notes (Signed)
[facility] PROGRESS NOTE Interim History: 63 y.o. female, past medical history significant for hypertension diabetes mellitus COPD liver cirrhosis and non-small lung cancer presenting with few days history of worsening shortness of breath, swelling and low-grade fever her primary medical doctor increased her Lasix to 40 mg daily however she still continued to swell . No nausea or vomiting although complains of bouts of diarrhea times. She also complains of pain all over including her left knee which she injured on March of this year, chest pain and abdominal pain . The patient was found to congestive heart failure clinically and per chest x-ray and BNP and I was called to admit. To note also that the patient had an EKG on admission that showed A-V dissociation that resolved on the second EKG. Home health nurse reports increase in weight from 177 pounds to 205 pounds in a few weeks.    Filed Weights   07/14/13 0505 07/15/13 0526 07/16/13 0523  Weight: 88.769 kg (195 lb 11.2 oz) 86.183 kg (190 lb) 84.732 kg (186 lb 12.8 oz)        Intake/Output Summary (Last 24 hours) at 07/16/13 1118 Last data filed at 07/16/13 1034  Gross per 24 hour  Intake   1680 ml  Output   5350 ml  Net  -3670 ml     Assessment/Plan: 1-acute on chronic resp failure: due to acute on chronic diastolic CHF exacerbation. -continue oxygen supplementation (2L chronically) -continue IV lasix; breathing improving, but still with some extra fluid on board; will most likely discharge on adjusted dose of lasix on 6/20 -cardiology consulted and s/p myoview on 6/17 (no acute ischemic process appreciated) -follow electrolytes and replete as needed -will follow daily weight and strict I's and O's -repeat BNP 933 -continue controlling BP  2-accelerated HTN: resolved with NTG drip -BP stable and well controlled now -will continue cozaar and bidil -lasix will help with BP as well -advise to follow low sodium diet at  discharge  3-AV block- transient AVD: resolved spontaneously; will now discontinue telemetry; patient w/o abnormalities for > 48 hours now.  4-presumed Unstable angina: s/p myoview w/o ischemic abnormalities; will follow rec's from cardiology -no B-blocker due to COPD -continue cozaar and bidil -continue ASA -no further CP  5-chronic resp failure due to COPD: continue chronic oxygen supplementation and home inhalers -no wheezing currently and good O2 sat   6-Type II or unspecified type diabetes mellitus with neurological manifestations, uncontrolled: will continue low carb diet and SSI, levemir and metformin -CBG's slightly low this morning -will adjust dose of levemir  7-anemia: Will continue niferex BID -CBC in am to follow Hgb trend  DVT: SCD's     Code Status: Full Family Communication: no family at bedside  Disposition Plan: home when medically stable    Consultants:  Cardiology   Procedures: ECHO: 07/12/13 - Left ventricle: The cavity size was normal. There was mild focal basal hypertrophy of the septum. Systolic function was normal. The estimated ejection fraction was in the range of 60% to 65%. Wall motion was normal; there were no regional wall motion abnormalities. Features are consistent with a pseudonormal left ventricular filling pattern, with concomitant abnormal relaxation and increased filling pressure (grade 2 diastolic dysfunction). - Left atrium: The atrium was moderately dilated. - Pericardium, extracardiac: A small to moderate pericardial effusion was identified circumferential to the heart. There was no evidence of hemodynamic compromise.   Myoview nuclear stress test: no ischemia   Antibiotics:  None   HPI/Subjective:  Denies CP; breathing continue improving; and endorses just mild SOB with exertion. Patient also with 1++ edema bilaterally  Objective: Filed Vitals:   07/15/13 1803 07/15/13 2014 07/15/13 2105 07/16/13 0523  BP: 150/48  133/42  166/52  Pulse: 85 57  57  Temp: 98.1 F (36.7 C) 97.5 F (36.4 C)  98.4 F (36.9 C)  TempSrc: Oral Oral  Oral  Resp: 18 18  18   Height:      Weight:    84.732 kg (186 lb 12.8 oz)  SpO2: 98% 98% 99% 97%     Exam:  General: Alert, awake, oriented x3, in no acute distress; afebrile and with continue improvement in her breathing HEENT: No bruits, no goiter; no JVD Heart: Regular rate; no rubs or gallops; 1-2++ edema bilaterally Lungs: improved air movement, no crackles or wheezing on exam Abdomen: Soft, nontender, nondistended, positive bowel sounds; left quadrant colostomy in place Neuro: Grossly intact, nonfocal.   Data Reviewed: Basic Metabolic Panel:  Recent Labs Lab 07/12/13 2232 07/14/13 0530 07/16/13 0650  NA 138 139 139  K 4.5 4.3 4.3  CL 99 99 96  CO2 23 27 29   GLUCOSE 99 116* 89  BUN 36* 32* 31*  CREATININE 0.97 0.97 1.07  CALCIUM 10.7* 10.1 10.5   Liver Function Tests:  Recent Labs Lab 07/12/13 2232  AST 22  ALT 17  ALKPHOS 107  BILITOT <0.2*  PROT 7.7  ALBUMIN 3.3*   CBC:  Recent Labs Lab 07/12/13 2232  WBC 13.9*  NEUTROABS 7.6  HGB 8.8*  HCT 28.0*  MCV 81.9  PLT 471*   Cardiac Enzymes:  Recent Labs Lab 07/13/13 0730 07/13/13 1110 07/13/13 1853  TROPONINI <0.30 <0.30 <0.30   BNP (last 3 results)  Recent Labs  11/13/12 1334 07/12/13 2232 07/16/13 0650  PROBNP 67.0 4136.0* 933.6*   CBG:  Recent Labs Lab 07/15/13 1110 07/15/13 1627 07/15/13 2116 07/16/13 0559 07/16/13 0641  GLUCAP 109* 85 138* 63* 91    Studies: Nm Myocar Multi W/spect W/wall Motion / Ef  07/14/2013   CLINICAL DATA:  63 year old with atypical chest pain.  EXAM: MYOCARDIAL IMAGING WITH SPECT (PHARMACOLOGIC-STRESS)  GATED LEFT VENTRICULAR WALL MOTION STUDY  LEFT VENTRICULAR EJECTION FRACTION  TECHNIQUE: Intravenous infusion of Lexiscan was performed under the supervision of the Cardiology staff. At peak effect of the drug, 10 mCi Tc-72m  sestamibi was injected intravenously and standard myocardial SPECT imaging was performed. Quantitative gated imaging was also performed to evaluate left ventricular wall motion, and estimate left ventricular ejection fraction.  COMPARISON:  None.  FINDINGS: There was mild breast attenuation. There was subtle decreased uptake along the distal anterior wall portion/apical portion at both rest and stress consistent with breast attenuation artifact. There was no evidence of ischemia. Otherwise, homogeneous radiotracer uptake. Ejection fraction was 63% with normal wall motion abnormality.  IMPRESSION: Low risk nuclear stress test, pharmacologic with no evidence of ischemia. Normal ejection fraction of 63%.   Electronically Signed   By: Candee Furbish   On: 07/14/2013 14:39    Scheduled Meds: . collagenase   Topical Daily  . dicyclomine  10 mg Oral TID  . folic acid  1 mg Oral QPM  . furosemide  40 mg Intravenous BID  . insulin aspart  0-20 Units Subcutaneous TID WC  . insulin aspart  4 Units Subcutaneous TID WC  . insulin glargine  40 Units Subcutaneous BID  . iron polysaccharides  150 mg Oral BID  . isosorbide-hydrALAZINE  1 tablet  Oral BID  . losartan  50 mg Oral Daily  . metFORMIN  1,000 mg Oral BID WC  . metoCLOPramide  10 mg Oral TID WC  . mometasone-formoterol  2 puff Inhalation BID  . pantoprazole  40 mg Oral Daily  . rifaximin  550 mg Oral BID  . sodium chloride  3 mL Intravenous Q12H   Continuous Infusions:   Time: > 30 minutes  Barton Dubois  [facility] Pager [phone]. If 8PM-8AM, please contact night-coverage at [email or web address], password [facility] 07/16/2013, 11:18 AM  LOS: 3 days     **Disclaimer: This note may have been dictated with voice recognition software. Similar sounding words can inadvertently be transcribed and this note may contain transcription errors which may not have been corrected upon publication of note.**

## 2013-07-16 NOTE — Progress Notes (Signed)
Chaplain was requested to visit patient to discuss AD. Pt was not aware of what an AD consisted of. Chaplin explained in great detailed the AD and Living Will. Chaplin assisted pt in completing parts of the AD, and she will complete the names and addresses of who will be given the authority to act on her behalf once she speaks with all 4 of her adult children. Pt. Was advised that she could either have the AD notarized here at the hospital or outside of the hospital because pt may be discharged today.  07/16/13 1100  Clinical Encounter Type  Visited With Patient  Visit Type Spiritual support;Other (Comment) (Advance Directive)  Referral From Patient  Consult/Referral To Chaplain  Spiritual Encounters  Spiritual Needs Prayer;Emotional  Stress Factors  Patient Stress Factors None identified  Family Stress Factors None identified   Pt. Declined living will and that part was checked off. Chaplain provided prayer and a listening ear for pt.  Charyl Dancer, [place]

## 2013-07-16 NOTE — Progress Notes (Signed)
SUBJECTIVE:  She thinks her breathing is back to baseline  OBJECTIVE:   Vitals:   Filed Vitals:   07/15/13 1803 07/15/13 2014 07/15/13 2105 07/16/13 0523  BP: 150/48 133/42  166/52  Pulse: 85 57  57  Temp: 98.1 F (36.7 C) 97.5 F (36.4 C)  98.4 F (36.9 C)  TempSrc: Oral Oral  Oral  Resp: 18 18  18   Height:      Weight:    186 lb 12.8 oz (84.732 kg)  SpO2: 98% 98% 99% 97%   I&O's:   Intake/Output Summary (Last 24 hours) at 07/16/13 5621 Last data filed at 07/16/13 3086  Gross per 24 hour  Intake   1680 ml  Output   6350 ml  Net  -4670 ml   TELEMETRY: Reviewed telemetry pt in NSR:     PHYSICAL EXAM General: Well developed, well nourished, in no acute distress Head: Eyes PERRLA, No xanthomas.   Normal cephalic and atramatic  Lungs:   Clear bilaterally to auscultation and percussion. Heart:   HRRR S1 S2 Pulses are 2+ & equal. Abdomen: Bowel sounds are positive, abdomen soft and non-tender without masses  Extremities:   1-2+ edema L>R Neuro: Alert and oriented X 3. Psych:  Good affect, responds appropriately   LABS: Basic Metabolic Panel:  Recent Labs  07/14/13 0530 07/16/13 0650  NA 139 139  K 4.3 4.3  CL 99 96  CO2 27 29  GLUCOSE 116* 89  BUN 32* 31*  CREATININE 0.97 1.07  CALCIUM 10.1 10.5   Liver Function Tests: No results found for this basename: AST, ALT, ALKPHOS, BILITOT, PROT, ALBUMIN,  in the last 72 hours No results found for this basename: LIPASE, AMYLASE,  in the last 72 hours CBC: No results found for this basename: WBC, NEUTROABS, HGB, HCT, MCV, PLT,  in the last 72 hours Cardiac Enzymes:  Recent Labs  07/13/13 1110 07/13/13 1853  TROPONINI <0.30 <0.30   BNP: No components found with this basename: POCBNP,  D-Dimer: No results found for this basename: DDIMER,  in the last 72 hours Hemoglobin A1C: No results found for this basename: HGBA1C,  in the last 72 hours Fasting Lipid Panel: No results found for this basename: CHOL,  HDL, LDLCALC, TRIG, CHOLHDL, LDLDIRECT,  in the last 72 hours Thyroid Function Tests: No results found for this basename: TSH, T4TOTAL, FREET3, T3FREE, THYROIDAB,  in the last 72 hours Anemia Panel: No results found for this basename: VITAMINB12, FOLATE, FERRITIN, TIBC, IRON, RETICCTPCT,  in the last 72 hours Coag Panel:   Lab Results  Component Value Date   INR 1.05 05/19/2013   INR 1.00 04/21/2013   INR 1.29 02/27/2013    RADIOLOGY: Dg Chest 2 View  07/12/2013   CLINICAL DATA:  Shortness of breath, low-grade fever.  EXAM: CHEST  2 VIEW  COMPARISON:  Chest radiograph May 19, 2013.  FINDINGS: The cardiac silhouette remains moderately enlarged, mediastinal silhouette is nonsuspicious. Stable chronic interstitial changes. Surgical clips in left hilum, and projecting in mid mediastinum. Central pulmonary vasculature congestion, without pleural effusions or focal consolidation. No pneumothorax appears similar scarring in left lung base.  Soft tissue planes and included osseous structures are nonsuspicious. Surgical clips in the right abdomen likely reflect cholecystectomy.  IMPRESSION: Stable cardiomegaly, central pulmonary vasculature congestion.   Electronically Signed   By: Elon Alas   On: 07/12/2013 23:54   Nm Myocar Multi W/spect W/wall Motion / Ef  07/14/2013   CLINICAL DATA:  63 year old with  atypical chest pain.  EXAM: MYOCARDIAL IMAGING WITH SPECT (PHARMACOLOGIC-STRESS)  GATED LEFT VENTRICULAR WALL MOTION STUDY  LEFT VENTRICULAR EJECTION FRACTION  TECHNIQUE: Intravenous infusion of Lexiscan was performed under the supervision of the Cardiology staff. At peak effect of the drug, 10 mCi Tc-58m sestamibi was injected intravenously and standard myocardial SPECT imaging was performed. Quantitative gated imaging was also performed to evaluate left ventricular wall motion, and estimate left ventricular ejection fraction.  COMPARISON:  None.  FINDINGS: There was mild breast attenuation. There  was subtle decreased uptake along the distal anterior wall portion/apical portion at both rest and stress consistent with breast attenuation artifact. There was no evidence of ischemia. Otherwise, homogeneous radiotracer uptake. Ejection fraction was 63% with normal wall motion abnormality.  IMPRESSION: Low risk nuclear stress test, pharmacologic with no evidence of ischemia. Normal ejection fraction of 63%.   Electronically Signed   By: Candee Furbish   On: 07/14/2013 14:39   Assessment/Plan  Principal Problem:  Acute diastolic heart failure  Active Problems:  CARCINOMA, LUNG, SQUAMOUS CELL  Type II or unspecified type diabetes mellitus with neurological manifestations, uncontrolled  HYPERTENSION  COPD  AV block- transient AVD  Unstable angina   1. New-onset acute diastolic CHF- BNP 4K, CXR with vascular congestion.  -- Pulmonary edema with increase in weight and anasarca, last echo in 2007  -- ECHO with an EF 60-65%, grade 2 DD, moderate LA dilation. Small to moderate pericardial effusion with no tamponade physiology. Of note, a small pericardial effusion was noted and a prior study from 2007  -- Net negative 16L overall and weight is down 4 lb in last 24 hrs .  BNP significantly improved.  Weight down from 205 to 186.  She says at home she is usually around 165-170lbs. -- Still with peripheral edema but improved.  She thinks her breathing is back to baseline and her lungs are clear -- Will continue diuresis with IV Lasix today and possibly change to PO tomorrow. Renal function and BP have been stable.  -- Continue strict I/Os and daily weights  -- Monitor renal function and electrolytes. Replete if needed.  2. Chest pain- atypical  -- Troponin neg x3  -- Lexiscan Myoview normal w/o evidence of ischemia. EF 63%.  3. R/O A-V dissociation- isorhythmic AV dissociation with sinus slowing vs junctional rhythm.  -- Now this has resolved. She is not on any negative ionotropic drugs. She is now back  in sinus so we will continue to monitor.  -- Trazadone has been discontinued.  4. Anemia with history of bleeding from around the ostomy bag, chronic     Catherine Margarita, MD  07/16/2013  9:06 AM

## 2013-07-16 NOTE — Progress Notes (Signed)
[name], [place], [place], [employer] [phone]

## 2013-07-17 LAB — GLUCOSE, CAPILLARY
GLUCOSE-CAPILLARY: 84 mg/dL (ref 70–99)
Glucose-Capillary: 67 mg/dL — ABNORMAL LOW (ref 70–99)
Glucose-Capillary: 69 mg/dL — ABNORMAL LOW (ref 70–99)
Glucose-Capillary: 82 mg/dL (ref 70–99)

## 2013-07-17 LAB — BASIC METABOLIC PANEL
BUN: 35 mg/dL — AB (ref 6–23)
CHLORIDE: 91 meq/L — AB (ref 96–112)
CO2: 27 mEq/L (ref 19–32)
Calcium: 11 mg/dL — ABNORMAL HIGH (ref 8.4–10.5)
Creatinine, Ser: 1.1 mg/dL (ref 0.50–1.10)
GFR calc non Af Amer: 53 mL/min — ABNORMAL LOW (ref >90)
GFR, EST AFRICAN AMERICAN: 61 mL/min — AB (ref >90)
Glucose, Bld: 111 mg/dL — ABNORMAL HIGH (ref 70–99)
Potassium: 4.3 mEq/L (ref 3.7–5.3)
Sodium: 135 mEq/L — ABNORMAL LOW (ref 137–147)

## 2013-07-17 LAB — CBC
HEMATOCRIT: 30.4 % — AB (ref 36.0–46.0)
HEMOGLOBIN: 9.4 g/dL — AB (ref 12.0–15.0)
MCH: 25.5 pg — ABNORMAL LOW (ref 26.0–34.0)
MCHC: 30.9 g/dL (ref 30.0–36.0)
MCV: 82.4 fL (ref 78.0–100.0)
Platelets: 495 10*3/uL — ABNORMAL HIGH (ref 150–400)
RBC: 3.69 MIL/uL — ABNORMAL LOW (ref 3.87–5.11)
RDW: 16.7 % — ABNORMAL HIGH (ref 11.5–15.5)
WBC: 14.1 10*3/uL — AB (ref 4.0–10.5)

## 2013-07-17 MED ORDER — LOSARTAN POTASSIUM 50 MG PO TABS: 50.0000 mg | ORAL_TABLET | Freq: Every day | ORAL | Status: DC

## 2013-07-17 MED ORDER — ISOSORB DINITRATE-HYDRALAZINE 20-37.5 MG PO TABS: 1.0000 | ORAL_TABLET | Freq: Two times a day (BID) | ORAL | Status: DC

## 2013-07-17 MED ORDER — FUROSEMIDE 40 MG PO TABS: 40.0000 mg | ORAL_TABLET | Freq: Two times a day (BID) | ORAL | Status: DC

## 2013-07-17 MED ORDER — POLYSACCHARIDE IRON COMPLEX 150 MG PO CAPS: 150.0000 mg | ORAL_CAPSULE | Freq: Two times a day (BID) | ORAL | Status: DC

## 2013-07-17 NOTE — Discharge Summary (Signed)
Physician Discharge Summary  [name]:[id number] DOB: 02-12-1950 DOA: 07/13/2013  PCP: [name], MD  Admit date: 07/13/2013 Discharge date: 07/17/2013  Time spent: >30 minutes  Recommendations for Outpatient Follow-up:  1. BMET to follow electrolytes and renal function 2. Reassess BP and adjust medications as needed 3. Please follow CBG's and diabetes; patient with hypoglycemia in 3 different episodes while inpatient. Discharge on levemir BID only' readjust hypoglycemic regimen as needed 4. Evaluate further mild calcium elevation seen on labs at discharge (appears not to be exactly new as per [facility] records)    BNP    Component Value Date/Time   PROBNP 933.6* 07/16/2013 0650   Filed Weights   07/16/13 0523 07/17/13 0552 07/17/13 0724  Weight: 84.732 kg (186 lb 12.8 oz) 83.87 kg (184 lb 14.4 oz) 84.55 kg (186 lb 6.4 oz)     Discharge Diagnoses:  Principal Problem:   Acute diastolic heart failure Active Problems:   CARCINOMA, LUNG, SQUAMOUS CELL   Type II or unspecified type diabetes mellitus with neurological manifestations, uncontrolled   HYPERTENSION   COPD   AV block- transient AVD   Unstable angina   Discharge Condition: stable and improved. No further episodes of CP at discharge; breathing at baseline. Will follow with PCP in 1 week and with cardiology as instructed  Diet recommendation: low sodium diet and low carbohydrates diet   History of present illness:  63 y.o. female, past medical history significant for hypertension diabetes mellitus COPD liver cirrhosis and non-small lung cancer presenting with few days history of worsening shortness of breath, swelling and low-grade fever her primary medical doctor increased her Lasix to 40 mg daily however she still continued to swell . No nausea or vomiting although complains of bouts of diarrhea times. She also complains of pain all over including her left knee which she injured on March of this year, chest  pain and abdominal pain . The patient was found to congestive heart failure clinically and per chest x-ray and BNP and I was called to admit. To note also that the patient had an EKG on admission that showed A-V dissociation that resolved on the second EKG. Home health nurse reports increase in weight from 177 pounds to 205 pounds in a few weeks.   Hospital Course:  1-acute on chronic resp failure: due to acute on chronic diastolic CHF exacerbation.  -continue oxygen supplementation (2L chronically)  -breathing is much better and now at baseline; patient is status post 18L off and weight at discharge around 184  -will discharge on adjusted lasix BID -Advise to follow daily weight, low sodium diet and strict fluid intake -repeat BNP 933 at discharge -continue controlling BP   2-accelerated HTN: resolved with NTG drip  -BP stable and well controlled now  -will discharge on cozaar and bidil  -lasix will also help with BP as well  -advise to follow low sodium diet at discharge   3-AV block- transient AVD: resolved spontaneously -no further abnormalities appreciated on telemetry -patient with negative cardiac work up.  4-presumed Unstable angina: s/p myoview w/o ischemic abnormalities. -no B-blocker due to COPD  -continue cozaar and bidil  -continue ASA  -no further CP during this admission -will follow with cardiology as an outpatient  5-chronic resp failure due to COPD: continue chronic oxygen supplementation and home inhalers  -no wheezing currently and good O2 sat on 2L  6-Type II or unspecified type diabetes mellitus with neurological manifestations: with 3 episodes of hypoglycemia while using  home hypoglycemic regimen. -will hold metformin and lispro insulin at discharge -will continue levemir BID -PCP to follow a readjust regimen as needed   7-anemia: Will continue niferex BID  -Hgb has remained stable (9.4 at discharge)   Procedures:  ECHO: 07/12/13  - Left ventricle:  The cavity size was normal. There was mild focal basal hypertrophy of the septum. Systolic function was normal. The estimated ejection fraction was in the range of 60% to 65%. Wall motion was normal; there were no regional wall motion abnormalities. Features are consistent with a pseudonormal left ventricular filling pattern, with concomitant abnormal relaxation and increased filling pressure (grade 2 diastolic dysfunction). - Left atrium: The atrium was moderately dilated. - Pericardium, extracardiac: A small to moderate pericardial effusion was identified circumferential to the heart. There was no evidence of hemodynamic compromise.    Myoview nuclear stress test: no ischemia   Consultations:  Cardiology   Discharge Exam: Filed Vitals:   07/17/13 0543  BP: 140/46  Pulse: 53  Temp: 97.8 F (36.6 C)  Resp: 18    General: Alert, awake, oriented x3, in no acute distress; afebrile and with continue improvement in her breathing  HEENT: No bruits, no goiter; no JVD  Heart: Regular rate; no rubs or gallops; 1+ edema bilaterally  Lungs: improved air movement, no crackles or wheezing on exam  Abdomen: Soft, nontender, nondistended, positive bowel sounds; left quadrant colostomy in place  Neuro: Grossly intact, nonfocal.   Discharge Instructions  Discharge Instructions   Diet - low sodium heart healthy    Complete by:  As directed      Discharge instructions    Complete by:  As directed   Arrange follow up with PCP in 1 week Take medications as prescribed Follow a low sodium diet (less than 2 grams daily) Check your wait on daily basis Follow with Dr. Claiborne Billings as instructed            Medication List    STOP taking these medications       HUMALOG KWIKPEN 100 UNIT/ML KiwkPen  Generic drug:  insulin lispro     metFORMIN 1000 MG tablet  Commonly known as:  GLUCOPHAGE     traZODone 100 MG tablet  Commonly known as:  DESYREL      TAKE these medications        albuterol-ipratropium 18-103 MCG/ACT inhaler  Commonly known as:  COMBIVENT  Inhale 2 puffs into the lungs every 4 (four) hours as needed for wheezing or shortness of breath.     diazepam 5 MG tablet  Commonly known as:  VALIUM  Take 1 tablet (5 mg total) by mouth 3 (three) times daily as needed for anxiety.     dicyclomine 10 MG capsule  Commonly known as:  BENTYL  Take 10 mg by mouth 3 (three) times daily.     esomeprazole 40 MG capsule  Commonly known as:  NEXIUM  Take 40 mg by mouth every evening.     Fluticasone-Salmeterol 250-50 MCG/DOSE Aepb  Commonly known as:  ADVAIR  Inhale 1 puff into the lungs every 12 (twelve) hours.     folic acid 1 MG tablet  Commonly known as:  FOLVITE  Take 1 tablet (1 mg total) by mouth every evening.     furosemide 40 MG tablet  Commonly known as:  LASIX  Take 1 tablet (40 mg total) by mouth 2 (two) times daily.     iron polysaccharides 150 MG capsule  Commonly known as:  NIFEREX  Take 1 capsule (150 mg total) by mouth 2 (two) times daily.     isosorbide-hydrALAZINE 20-37.5 MG per tablet  Commonly known as:  BIDIL  Take 1 tablet by mouth 2 (two) times daily.     LANTUS SOLOSTAR 100 UNIT/ML Solostar Pen  Generic drug:  Insulin Glargine  Inject 60 Units into the skin 2 (two) times daily.     losartan 50 MG tablet  Commonly known as:  COZAAR  Take 1 tablet (50 mg total) by mouth daily.     methocarbamol 500 MG tablet  Commonly known as:  ROBAXIN  Take 1 tablet (500 mg total) by mouth every 6 (six) hours as needed for muscle spasms.     metoCLOPramide 10 MG tablet  Commonly known as:  REGLAN  Take 1 tablet (10 mg total) by mouth 3 (three) times daily.     Oxycodone HCl 20 MG Tabs  Take 20 mg by mouth every 3 (three) hours as needed (pain).     promethazine 25 MG tablet  Commonly known as:  PHENERGAN  Take 25 mg by mouth every 6 (six) hours as needed for nausea or vomiting.     rifaximin 550 MG Tabs tablet  Commonly known as:   XIFAXAN  Take 550 mg by mouth 2 (two) times daily.       Allergies  Allergen Reactions  . Penicillins Anaphylaxis and Rash  . Acetaminophen Other (See Comments)    Cirrhosis of liver  . Codeine Phosphate Other (See Comments)    REACTION: Stomach cramps  . Hydrocodone-Acetaminophen Other (See Comments)    REACTION: hallucinations  . Hydrocodone-Acetaminophen Other (See Comments)  . Morphine Other (See Comments)    REACTION: Lowers BP  . Other Other (See Comments)    AGENT:  Per pt, cannot take blood thinners due to cirrhosis of the liver  . Cephalexin Swelling and Rash       Follow-up Information   Follow up with [facility]. (RN/PT)    Contact information:   [place] [address] [phone]       Follow up with Alysia Penna A, MD In 1 week.   Specialty:  Family Medicine   Contact information:   [place] [address] [phone]       Follow up with Troy Sine, MD. (office will contact you with appointment details)    Specialty:  Cardiology   Contact information:   [address] [place] [address] [phone]        The results of significant diagnostics from this hospitalization (including imaging, microbiology, ancillary and laboratory) are listed below for reference.    Significant Diagnostic Studies: Dg Chest 2 View  07/12/2013   CLINICAL DATA:  Shortness of breath, low-grade fever.  EXAM: CHEST  2 VIEW  COMPARISON:  Chest radiograph May 19, 2013.  FINDINGS: The cardiac silhouette remains moderately enlarged, mediastinal silhouette is nonsuspicious. Stable chronic interstitial changes. Surgical clips in left hilum, and projecting in mid mediastinum. Central pulmonary vasculature congestion, without pleural effusions or focal consolidation. No pneumothorax appears similar scarring in left lung base.  Soft tissue planes and included osseous structures are nonsuspicious. Surgical  clips in the right abdomen likely reflect cholecystectomy.  IMPRESSION: Stable cardiomegaly, central pulmonary vasculature congestion.   Electronically Signed   By: Elon Alas   On: 07/12/2013 23:54   Nm Myocar Multi W/spect W/wall Motion / Ef  07/14/2013   CLINICAL DATA:  63 year old with atypical chest pain.  EXAM: MYOCARDIAL IMAGING WITH SPECT (PHARMACOLOGIC-STRESS)  GATED LEFT VENTRICULAR WALL MOTION STUDY  LEFT VENTRICULAR EJECTION FRACTION  TECHNIQUE: Intravenous infusion of Lexiscan was performed under the supervision of the Cardiology staff. At peak effect of the drug, 10 mCi Tc-58m sestamibi was injected intravenously and standard myocardial SPECT imaging was performed. Quantitative gated imaging was also performed to evaluate left ventricular wall motion, and estimate left ventricular ejection fraction.  COMPARISON:  None.  FINDINGS: There was mild breast attenuation. There was subtle decreased uptake along the distal anterior wall portion/apical portion at both rest and stress consistent with breast attenuation artifact. There was no evidence of ischemia. Otherwise, homogeneous radiotracer uptake. Ejection fraction was 63% with normal wall motion abnormality.  IMPRESSION: Low risk nuclear stress test, pharmacologic with no evidence of ischemia. Normal ejection fraction of 63%.   Electronically Signed   By: Candee Furbish   On: 07/14/2013 14:39   Labs: Basic Metabolic Panel:  Recent Labs Lab 07/12/13 2232 07/14/13 0530 07/16/13 0650 07/17/13 0407  NA 138 139 139 135*  K 4.5 4.3 4.3 4.3  CL 99 99 96 91*  CO2 23 27 29 27   GLUCOSE 99 116* 89 111*  BUN 36* 32* 31* 35*  CREATININE 0.97 0.97 1.07 1.10  CALCIUM 10.7* 10.1 10.5 11.0*   Liver Function Tests:  Recent Labs Lab 07/12/13 2232  AST 22  ALT 17  ALKPHOS 107  BILITOT <0.2*  PROT 7.7  ALBUMIN 3.3*   CBC:  Recent Labs Lab 07/12/13 2232 07/17/13 0407  WBC 13.9* 14.1*  NEUTROABS 7.6  --   HGB 8.8* 9.4*  HCT  28.0* 30.4*  MCV 81.9 82.4  PLT 471* 495*   Cardiac Enzymes:  Recent Labs Lab 07/13/13 0730 07/13/13 1110 07/13/13 1853  TROPONINI <0.30 <0.30 <0.30   BNP: BNP (last 3 results)  Recent Labs  11/13/12 1334 07/12/13 2232 07/16/13 0650  PROBNP 67.0 4136.0* 933.6*   CBG:  Recent Labs Lab 07/16/13 1650 07/16/13 2133 07/17/13 0603 07/17/13 0636 07/17/13 0648  GLUCAP 75 140* 67* 69* 82    Signed:  Barton Dubois  [facility] 07/17/2013, 10:43 AM   **Disclaimer: This note may have been dictated with voice recognition software. Similar sounding words can inadvertently be transcribed and this note may contain transcription errors which may not have been corrected upon publication of note.**

## 2013-07-17 NOTE — Progress Notes (Signed)
SUBJECTIVE:  Breathing much better.  No chest pain   PHYSICAL EXAM Filed Vitals:   07/17/13 0543 07/17/13 0552 07/17/13 0724 07/17/13 0850  BP: 140/46     Pulse: 53     Temp: 97.8 F (36.6 C)     TempSrc: Oral     Resp: 18     Height:      Weight:  184 lb 14.4 oz (83.87 kg) 186 lb 6.4 oz (84.55 kg)   SpO2: 97%   98%   General:  No distress Lungs:  Few basilar crackles Heart:  RRR Abdomen:  Positive bowel sounds, no rebound no guarding Extremities:  Mild/trace edema   LABS:  Results for orders placed during the hospital encounter of 07/13/13 (from the past 24 hour(s))  GLUCOSE, CAPILLARY     Status: None   Collection Time    07/16/13 12:28 PM      Result Value Ref Range   Glucose-Capillary 94  70 - 99 mg/dL  GLUCOSE, CAPILLARY     Status: None   Collection Time    07/16/13  4:50 PM      Result Value Ref Range   Glucose-Capillary 75  70 - 99 mg/dL   Comment 1 Notify RN    GLUCOSE, CAPILLARY     Status: Abnormal   Collection Time    07/16/13  9:33 PM      Result Value Ref Range   Glucose-Capillary 140 (*) 70 - 99 mg/dL   Comment 1 Notify RN     Comment 2 Documented in Chart    BASIC METABOLIC PANEL     Status: Abnormal   Collection Time    07/17/13  4:07 AM      Result Value Ref Range   Sodium 135 (*) 137 - 147 mEq/L   Potassium 4.3  3.7 - 5.3 mEq/L   Chloride 91 (*) 96 - 112 mEq/L   CO2 27  19 - 32 mEq/L   Glucose, Bld 111 (*) 70 - 99 mg/dL   BUN 35 (*) 6 - 23 mg/dL   Creatinine, Ser 1.10  0.50 - 1.10 mg/dL   Calcium 11.0 (*) 8.4 - 10.5 mg/dL   GFR calc non Af Amer 53 (*) >90 mL/min   GFR calc Af Amer 61 (*) >90 mL/min  CBC     Status: Abnormal   Collection Time    07/17/13  4:07 AM      Result Value Ref Range   WBC 14.1 (*) 4.0 - 10.5 K/uL   RBC 3.69 (*) 3.87 - 5.11 MIL/uL   Hemoglobin 9.4 (*) 12.0 - 15.0 g/dL   HCT 30.4 (*) 36.0 - 46.0 %   MCV 82.4  78.0 - 100.0 fL   MCH 25.5 (*) 26.0 - 34.0 pg   MCHC 30.9  30.0 - 36.0 g/dL   RDW 16.7 (*) 11.5  - 15.5 %   Platelets 495 (*) 150 - 400 K/uL  GLUCOSE, CAPILLARY     Status: Abnormal   Collection Time    07/17/13  6:03 AM      Result Value Ref Range   Glucose-Capillary 67 (*) 70 - 99 mg/dL  GLUCOSE, CAPILLARY     Status: Abnormal   Collection Time    07/17/13  6:36 AM      Result Value Ref Range   Glucose-Capillary 69 (*) 70 - 99 mg/dL  GLUCOSE, CAPILLARY     Status: None   Collection Time    07/17/13  6:48 AM      Result Value Ref Range   Glucose-Capillary 82  70 - 99 mg/dL    Intake/Output Summary (Last 24 hours) at 07/17/13 0853 Last data filed at 07/17/13 0846  Gross per 24 hour  Intake   1680 ml  Output   3600 ml  Net  -1920 ml     ASSESSMENT AND PLAN:  ACUTE ON CHRONIC CHF:   Minus 18 liters thus far.   Weight down 9 lbs thus far.  Follow up with Dr. Claiborne Billings.   I agree with plans for discharge with 40 Lasix BID.  Discussed salt and fluid restriction.  She will need follow up BMET in one week.    CHEST PAIN:  Negative Lexiscan Myoview.  No further work up.    BRADYCARDIA:   Resolved.    ANEMIA:  Chronic and stable   Minus Breeding 07/17/2013 8:53 AM

## 2013-07-17 NOTE — Progress Notes (Signed)
Hypoglycemic Event  CBG: 67  Treatment:  4oz juice  Symptoms: none  Follow-up CBG: Time: CBG Result:   Possible Reasons for Event: medication changes  Comments/MD notified:    Tresa Endo  Remember to initiate Hypoglycemia Order Set & complete

## 2013-07-19 ENCOUNTER — Telehealth: Payer: Self-pay | Admitting: Cardiovascular Disease

## 2013-07-19 MED ORDER — HYDRALAZINE HCL 25 MG PO TABS
37.5000 mg | ORAL_TABLET | Freq: Two times a day (BID) | ORAL | Status: DC
Start: 2013-07-19 — End: 2013-10-13

## 2013-07-19 MED ORDER — ISOSORBIDE DINITRATE 20 MG PO TABS: 20.0000 mg | ORAL_TABLET | Freq: Two times a day (BID) | ORAL | Status: DC

## 2013-07-19 NOTE — Telephone Encounter (Signed)
Patient notified of Bidil being changed out to Hydralazine and Isosorbide DN.  Voiced understanding.

## 2013-07-19 NOTE — Telephone Encounter (Signed)
Please call,concerning her prescription. It need to be two separate prescription for Hydralazine and Isusorbid din>pharmacist said it can not be combined. Please call to [phone].

## 2013-07-19 NOTE — Telephone Encounter (Signed)
Per pharmacy insurance will not cover Bidil.  Cheaper alternative will be to split the med Hydralazine 25mg  1 1/2 bid and Isosorbide DN 20mg  bid.  Instructions given to pharmacy and patient notified.

## 2013-07-20 ENCOUNTER — Ambulatory Visit: Payer: Medicare Other | Admitting: Pulmonary Disease

## 2013-07-22 ENCOUNTER — Telehealth: Payer: Self-pay | Admitting: Pulmonary Disease

## 2013-07-22 ENCOUNTER — Telehealth: Payer: Self-pay | Admitting: Family Medicine

## 2013-07-22 NOTE — Telephone Encounter (Signed)
[name] from home health called to say she needs orders for a pt twice a week for 3 weeks  Pt said can call this number and leave a verbal order  [phone]

## 2013-07-22 NOTE — Telephone Encounter (Signed)
I left a voice message for [name] with home health, advising that [name] is out of the office until next week and I will forward this request, these orders should come from the primary care doctor.

## 2013-07-22 NOTE — Telephone Encounter (Signed)
Called spoke with pt. Pt cancelled appt for tomorrow. She is scheduled for 7/13 w/ KC. Nothing sooner. Nothing further needed

## 2013-07-26 NOTE — Telephone Encounter (Signed)
Please call in PT orders

## 2013-07-26 NOTE — Telephone Encounter (Signed)
Mickel Baas calling to fu on orders.  pls call so they can do tomorrow. Tues.  pls advise

## 2013-07-27 NOTE — Telephone Encounter (Signed)
I spoke with [name] and gave verbal orders for PT.

## 2013-07-29 ENCOUNTER — Encounter: Payer: Self-pay | Admitting: Family Medicine

## 2013-07-29 ENCOUNTER — Ambulatory Visit (INDEPENDENT_AMBULATORY_CARE_PROVIDER_SITE_OTHER): Payer: Medicare Other | Admitting: Family Medicine

## 2013-07-29 VITALS — BP 130/54 | HR 68 | Temp 98.6°F | Ht 60.0 in | Wt 184.0 lb

## 2013-07-29 DIAGNOSIS — I200 Unstable angina: Secondary | ICD-10-CM

## 2013-07-29 DIAGNOSIS — E1149 Type 2 diabetes mellitus with other diabetic neurological complication: Secondary | ICD-10-CM

## 2013-07-29 DIAGNOSIS — I509 Heart failure, unspecified: Secondary | ICD-10-CM

## 2013-07-29 DIAGNOSIS — K5791 Diverticulosis of intestine, part unspecified, without perforation or abscess with bleeding: Secondary | ICD-10-CM

## 2013-07-29 DIAGNOSIS — J449 Chronic obstructive pulmonary disease, unspecified: Secondary | ICD-10-CM

## 2013-07-29 DIAGNOSIS — I5031 Acute diastolic (congestive) heart failure: Secondary | ICD-10-CM

## 2013-07-29 DIAGNOSIS — I10 Essential (primary) hypertension: Secondary | ICD-10-CM

## 2013-07-29 DIAGNOSIS — J4489 Other specified chronic obstructive pulmonary disease: Secondary | ICD-10-CM

## 2013-07-29 DIAGNOSIS — K5731 Diverticulosis of large intestine without perforation or abscess with bleeding: Secondary | ICD-10-CM

## 2013-07-29 LAB — BASIC METABOLIC PANEL
BUN: 36 mg/dL — ABNORMAL HIGH (ref 6–23)
CHLORIDE: 100 meq/L (ref 96–112)
CO2: 26 mEq/L (ref 19–32)
Calcium: 10.5 mg/dL (ref 8.4–10.5)
Creatinine, Ser: 1.2 mg/dL (ref 0.4–1.2)
GFR: 47.55 mL/min — ABNORMAL LOW (ref >60.00)
GLUCOSE: 96 mg/dL (ref 70–99)
Potassium: 4.1 mEq/L (ref 3.5–5.1)
Sodium: 136 mEq/L (ref 135–145)

## 2013-07-29 LAB — CBC WITH DIFFERENTIAL/PLATELET
BASOS PCT: 0.9 % (ref 0.0–3.0)
Basophils Absolute: 0.1 10*3/uL (ref 0.0–0.1)
Eosinophils Absolute: 0.9 10*3/uL — ABNORMAL HIGH (ref 0.0–0.7)
Eosinophils Relative: 7.2 % — ABNORMAL HIGH (ref 0.0–5.0)
HCT: 30.6 % — ABNORMAL LOW (ref 36.0–46.0)
HEMOGLOBIN: 9.6 g/dL — AB (ref 12.0–15.0)
LYMPHS ABS: 3 10*3/uL (ref 0.7–4.0)
LYMPHS PCT: 24.4 % (ref 12.0–46.0)
MCHC: 31.3 g/dL (ref 30.0–36.0)
MCV: 83.5 fl (ref 78.0–100.0)
MONOS PCT: 15 % — AB (ref 3.0–12.0)
Monocytes Absolute: 1.9 10*3/uL — ABNORMAL HIGH (ref 0.1–1.0)
Neutro Abs: 6.5 10*3/uL (ref 1.4–7.7)
Neutrophils Relative %: 52.5 % (ref 43.0–77.0)
Platelets: 418 10*3/uL — ABNORMAL HIGH (ref 150.0–400.0)
RBC: 3.66 Mil/uL — AB (ref 3.87–5.11)
RDW: 17.4 % — ABNORMAL HIGH (ref 11.5–15.5)
WBC: 12.4 10*3/uL — ABNORMAL HIGH (ref 4.0–10.5)

## 2013-07-29 LAB — HEMOGLOBIN A1C: Hgb A1c MFr Bld: 6.8 % — ABNORMAL HIGH (ref 4.6–6.5)

## 2013-07-29 MED ORDER — DIAZEPAM 5 MG PO TABS: 5.0000 mg | ORAL_TABLET | Freq: Three times a day (TID) | ORAL | Status: DC | PRN

## 2013-07-29 MED ORDER — OXYCODONE HCL 20 MG PO TABS
20.0000 mg | ORAL_TABLET | ORAL | Status: DC | PRN
Start: 2013-07-29 — End: 2013-10-13

## 2013-07-29 MED ORDER — TRAZODONE HCL 100 MG PO TABS: 200.0000 mg | ORAL_TABLET | Freq: Every day | ORAL | Status: DC

## 2013-07-29 NOTE — Progress Notes (Signed)
   Subjective:    Patient ID: [name], female    DOB: [birth year], 64 y.o.   MRN: [id number]  HPI Here to follow up a hospital stay from 07-13-13 to 07-17-13 for an acute eopisode of diastolic heart failure. Her EF was preserved at 60-65%. Her cardiac stress test was negative for ischemia. She was diuresed and her meds were altered to deal with this. She has done well since then, she is able to get around with her walker. She is limiting her sodium intake. She has lost over 20 lbs since then, and she remains on a total of 80 mg a day of Lasix. She wears 2 liters of continuous oxygen.   Review of Systems  Constitutional: Negative.   Respiratory: Positive for shortness of breath. Negative for cough and wheezing.   Cardiovascular: Positive for leg swelling. Negative for chest pain and palpitations.       Objective:   Physical Exam  Constitutional: She is oriented to person, place, and time. She appears well-developed and well-nourished.  Neck: No thyromegaly present.  Cardiovascular: Normal rate, regular rhythm, normal heart sounds and intact distal pulses.   Pulmonary/Chest: Effort normal and breath sounds normal.  Lymphadenopathy:    She has no cervical adenopathy.  Neurological: She is alert and oriented to person, place, and time.          Assessment & Plan:  She is doing well at this time. Stay on current meds. Get labs today

## 2013-07-29 NOTE — Progress Notes (Signed)
Pre visit review using our clinic review tool, if applicable. No additional management support is needed unless otherwise documented below in the visit note. 

## 2013-08-06 ENCOUNTER — Telehealth: Payer: Self-pay | Admitting: Family Medicine

## 2013-08-06 NOTE — Telephone Encounter (Signed)
She should be taking Lasix 40 mg BID

## 2013-08-06 NOTE — Telephone Encounter (Signed)
[name] from [facility] call to verify the dosage pt should be taking for rx         Demographics [name] 63 year old female  [address]  [place] [address] [phone] (H)  Comm Pref: None [address] [place] [address] [phone] (H)   Advanced Directives 07/13/13 06/23/13 05/18/13  Advance Directive Patient does not have advance directive, Patient would not like information Patient does not have advance directive Patient does not have advance directive, Patient would not like information  Chief Complaint   Advice Only     Problem ListChronic CARCINOMA, LUNG, SQUAMOUS CELL   Type II or unspecified type diabetes mellitus with neurological manifestations, uncontrolled    DEPRESSION   HYPERTENSION   COPD    GERD    HEADACHE    COLONIC POLYPS, HX OF    Acute GI bleeding    Chronic respiratory failure    Cirrhosis of liver not due to alcohol    Thrombocytopenia    Anemia    Irritable bowel syndrome    Hx of cervical cancer    Unspecified deficiency anemia    HHNC (hyperglycemic hyperosmolar nonketotic coma)    AKI (acute kidney injury)    Splenomegaly    Pneumonia, organism unspecified    Hypercalcemia    Patellar fracture    Left patella fracture    Anxiety state, unspecified    GI bleed    Hyperkalemia    Acute diastolic heart failure    CHF (congestive heart failure)    Congestive heart failure    AV block- transient AVD    Unstable angina  Health MaintenanceHoldSoonDueLate       Topic Due Last Communication   Pap Smear  01/25/1970      Mammogram  09/27/2011      Zostavax  01/26/2012       Influenza Vaccine  08/28/2013       Colonoscopy  03/18/2021       Tetanus/tdap  04/22/2023     Reminders and ResultsDone     None     Care Team and CommunicationsReferring Provider    No referring provider set   PCPs Type  [name], MD General  Other Patient Care Team Members Relationship  None   Recipients of Past Communications  [name], MD 07/13/2013  [place] 07/17/2013  [place] 07/17/2013  [name], MD 05/19/2013  Augustin Schooling, MD 05/04/2013  [name], MD 04/21/2013  Augustin Schooling, MD 04/26/2013  [name], MD 11/14/2012  [name], MD 11/13/2012  [name], MD 11/07/2012  [name], MD 11/07/2012  [name], MD 11/06/2012  [name], MD 11/06/2012  [name], MD 03/17/2011  [name], MD 03/21/2011   Date of Birth1952-09-21Allergies   Penicillins  Anaphylaxis, Rash   Acetaminophen  Other (See Comments)   Codeine Phosphate  Other (See Comments)   Hydrocodone-acetaminophen  Other (See Comments)   Hydrocodone-acetaminophen  Other (See Comments)   Morphine  Other (See Comments)   Other  Other (See Comments)   Cephalexin  Swelling, Rash    Last Reviewed by [name], MD on 07/29/2013 at 12:38 PM: Review CompleteMedicationsLong-Term albuterol-ipratropium (COMBIVENT) 18-103 MCG/ACT inhaler    diazepam (VALIUM) 5 MG tablet    dicyclomine (BENTYL) 10 MG capsule    esomeprazole (NEXIUM) 40 MG capsule  Fluticasone-Salmeterol (ADVAIR) 250-50 MCG/DOSE AEPB    folic acid (FOLVITE) 1 MG tablet    furosemide (LASIX) 40 MG tablet   hydrALAZINE (APRESOLINE) 25 MG tablet    Insulin Glargine (LANTUS SOLOSTAR) 100 UNIT/ML Solostar Pen   iron polysaccharides (NIFEREX) 150 MG capsule   isosorbide dinitrate (ISORDIL) 20 MG tablet   losartan (COZAAR) 50 MG tablet    methocarbamol (ROBAXIN) 500 MG tablet   metoCLOPramide (REGLAN) 10 MG tablet    Oxycodone HCl 20 MG TABS    promethazine (PHENERGAN) 25 MG tablet    rifaximin (XIFAXAN) 550 MG TABS tablet    traZODone (DESYREL) 100 MG tablet     Preferred Pharmacies   [id number] [place] ([place]), [place] - [address] [phone] (Phone) [phone] (Fax)  [id number] [place], [place] [phone] (Phone) [phone] (Fax)   Immunizations/Injections   Influenza Split 10/29/2011  Influenza Whole 12/03/2006, 10/29/2002  Influenza, High Dose Seasonal PF 11/02/2012  Meningococcal Polysaccharide 03/05/2013  Pneumococcal Conjugate-13 04/12/2013  Pneumococcal Polysaccharide-23 01/29/2003  Tdap 04/21/2013  Significant History/Details  Smoking: Former Smoker (Quit Date:03/24/2002), 2 ppd, 70 pack-years  Smokeless Tobacco: Never Used  Alcohol: No  Comments: DPR signed on 12-10-2012. OK to disclose PHI to son [name] [phone] and daughter [name] [phone] daughter [name] [phone]  5 open orders  Preferred Language: English  Specialty CommentsShow AllReportNo comments regarding your specialty  Family CommentsNone                     [name] from [facility] called to clarify what dosage pt should be taking of rx   furosemide (LASIX) 40 MG tablet she said pt told her Dr Sarajane Jews told her to take it twice a day pt has a weight gain of 2 lbs    [name] cell phone number [phone]

## 2013-08-06 NOTE — Telephone Encounter (Signed)
I spoke with [name] at [facility] and gave below message.

## 2013-08-09 ENCOUNTER — Ambulatory Visit (INDEPENDENT_AMBULATORY_CARE_PROVIDER_SITE_OTHER): Payer: Medicare Other | Admitting: Pulmonary Disease

## 2013-08-09 ENCOUNTER — Encounter: Payer: Self-pay | Admitting: Pulmonary Disease

## 2013-08-09 ENCOUNTER — Telehealth: Payer: Self-pay | Admitting: Family Medicine

## 2013-08-09 VITALS — BP 130/70 | HR 84 | Temp 97.7°F | Ht 60.0 in | Wt 188.6 lb

## 2013-08-09 DIAGNOSIS — J449 Chronic obstructive pulmonary disease, unspecified: Secondary | ICD-10-CM

## 2013-08-09 DIAGNOSIS — J9610 Chronic respiratory failure, unspecified whether with hypoxia or hypercapnia: Secondary | ICD-10-CM

## 2013-08-09 DIAGNOSIS — J4489 Other specified chronic obstructive pulmonary disease: Secondary | ICD-10-CM

## 2013-08-09 DIAGNOSIS — R0902 Hypoxemia: Secondary | ICD-10-CM

## 2013-08-09 DIAGNOSIS — I200 Unstable angina: Secondary | ICD-10-CM

## 2013-08-09 DIAGNOSIS — J9611 Chronic respiratory failure with hypoxia: Secondary | ICD-10-CM

## 2013-08-09 NOTE — Telephone Encounter (Signed)
Per [name], okay to give the verbal order. I spoke with Mickel Baas and gave the verbal order.

## 2013-08-09 NOTE — Assessment & Plan Note (Signed)
The patient appears to be stable from a COPD standpoint on her current bronchodilator regimen. I have reminded her that her degree of COPD is mild, and is only contributing a little bit to her overall dyspnea on exertion.

## 2013-08-09 NOTE — Telephone Encounter (Signed)
Mickel Baas physical therapist from [facility] would like to extend pt Physical therapy for twice a wk for 4 wk. Ok to leave verbal on her cell

## 2013-08-09 NOTE — Assessment & Plan Note (Signed)
The patient's chronic respiratory failure is primarily due to to her chronic diastolic heart failure and underlying liver disease. Again, she has mild COPD. In the office today she maintained adequate saturations at rest, but did desaturate with walking around the office.

## 2013-08-09 NOTE — Patient Instructions (Addendum)
Stay on your current breathing medications Continue to stay on top of your weights/fluid balance, as well as cardiology followup. You do not need oxygen while at home at rest, but do need with sleep and when doing exertion.  See me again in 10mos.

## 2013-08-09 NOTE — Progress Notes (Signed)
   Subjective:    Patient ID: [name], female    DOB: [birth year], 63 y.o.   MRN: [id number]  HPI The patient comes in today for followup of her mild COPD. She has been in the hospital since the last visit, primarily related to chronic congestive heart failure. She had gained over 20 pounds of fluid in a short amount of time, but is much improved after aggressive diuresis. She has been staying on her bronchodilator regimen as well as her oxygen. She has a mild cough but is intermittent, and sometimes produce a small quantities of yellow mucus, but does not feel congested.   Review of Systems  Constitutional: Negative for fever and unexpected weight change.  HENT: Negative for congestion, dental problem, ear pain, nosebleeds, postnasal drip, rhinorrhea, sinus pressure, sneezing, sore throat and trouble swallowing.   Eyes: Negative for redness and itching.  Respiratory: Positive for cough, chest tightness and shortness of breath. Negative for wheezing.   Cardiovascular: Positive for leg swelling. Negative for palpitations.  Gastrointestinal: Negative for nausea and vomiting.  Genitourinary: Negative for dysuria.  Musculoskeletal: Negative for joint swelling.  Skin: Negative for rash.  Neurological: Negative for headaches.  Hematological: Does not bruise/bleed easily.  Psychiatric/Behavioral: Negative for dysphoric mood. The patient is not nervous/anxious.        Objective:   Physical Exam Frail-appearing female in no acute distress Nose without purulence or discharge noted Neck without lymphadenopathy or thyromegaly Chest totally clear to auscultation, with no wheezing Cardiac exam with regular rate and rhythm, 3/6 rumbling murmur. Lower extremities with edema noted, no cyanosis Alert and oriented, moves all 4 extremities.       Assessment & Plan:

## 2013-08-16 DIAGNOSIS — Z4789 Encounter for other orthopedic aftercare: Secondary | ICD-10-CM

## 2013-08-16 DIAGNOSIS — J449 Chronic obstructive pulmonary disease, unspecified: Secondary | ICD-10-CM

## 2013-08-16 DIAGNOSIS — I5021 Acute systolic (congestive) heart failure: Secondary | ICD-10-CM

## 2013-08-16 DIAGNOSIS — T8189XA Other complications of procedures, not elsewhere classified, initial encounter: Secondary | ICD-10-CM

## 2013-08-16 DIAGNOSIS — R269 Unspecified abnormalities of gait and mobility: Secondary | ICD-10-CM

## 2013-08-18 ENCOUNTER — Telehealth: Payer: Self-pay | Admitting: Family Medicine

## 2013-08-18 NOTE — Telephone Encounter (Signed)
Kenney Houseman is calling to report pt has gain 2 lbs since yesterday and pt heart rate is 56 at rest. Pt report she  is feeling sluggish and decline appt. Pt bp 134/50. Please advise

## 2013-08-18 NOTE — Telephone Encounter (Signed)
I spoke with Tonya and gave below advise.

## 2013-08-18 NOTE — Telephone Encounter (Signed)
Stay on 80 mg a day of Lasix for now. If she gains any more weight this week we will increase the Lasix.

## 2013-08-23 ENCOUNTER — Telehealth: Payer: Self-pay | Admitting: Family Medicine

## 2013-08-23 DIAGNOSIS — K921 Melena: Secondary | ICD-10-CM

## 2013-08-23 NOTE — Telephone Encounter (Signed)
Pt called to req a referral to see a GI Dr [name] . Pt said she is having some stomach issues and she has seen blood in her colostomy bag

## 2013-08-24 ENCOUNTER — Encounter: Payer: Self-pay | Admitting: Gastroenterology

## 2013-08-24 NOTE — Telephone Encounter (Signed)
S/w pt and she been notified of appt

## 2013-08-24 NOTE — Telephone Encounter (Signed)
Referral was done  

## 2013-08-25 ENCOUNTER — Telehealth: Payer: Self-pay

## 2013-08-25 NOTE — Telephone Encounter (Signed)
Increase the Lasix to 2 tablets (total of 80 mg) bid

## 2013-08-25 NOTE — Telephone Encounter (Signed)
I spoke with Anderson Malta with home health and gave new directions for Lasix, also updated medication list. Anderson Malta said that she was on way to pt's home and would give this information.

## 2013-08-25 NOTE — Telephone Encounter (Signed)
I spoke with

## 2013-08-25 NOTE — Telephone Encounter (Signed)
Pt has CHF and has had a total weight gain of 5lbs in 2 days.  Pls advise on what needs to be done.

## 2013-08-31 DIAGNOSIS — R269 Unspecified abnormalities of gait and mobility: Secondary | ICD-10-CM

## 2013-08-31 DIAGNOSIS — J449 Chronic obstructive pulmonary disease, unspecified: Secondary | ICD-10-CM

## 2013-08-31 DIAGNOSIS — Z4789 Encounter for other orthopedic aftercare: Secondary | ICD-10-CM

## 2013-08-31 DIAGNOSIS — T8620 Unspecified complication of heart transplant: Secondary | ICD-10-CM

## 2013-08-31 DIAGNOSIS — I5021 Acute systolic (congestive) heart failure: Secondary | ICD-10-CM

## 2013-09-01 ENCOUNTER — Telehealth: Payer: Self-pay | Admitting: Family Medicine

## 2013-09-01 NOTE — Telephone Encounter (Signed)
[name] called form home care, pt's weight has stabilized, need further directions on the Lasix? Per [name] advise pt to take Lasix 80 mg in the AM and 40 mg in the PM. I updated medication list and spoke with [name]. She will give this information to pt.

## 2013-09-02 ENCOUNTER — Inpatient Hospital Stay (HOSPITAL_COMMUNITY)
Admission: EM | Admit: 2013-09-02 | Discharge: 2013-09-08 | DRG: 243 | Disposition: A | Discharge status: Home Health | Payer: Medicare Other | Attending: Internal Medicine | Admitting: Internal Medicine

## 2013-09-02 ENCOUNTER — Encounter (HOSPITAL_COMMUNITY): Payer: Self-pay | Admitting: Emergency Medicine

## 2013-09-02 ENCOUNTER — Emergency Department (HOSPITAL_COMMUNITY): Payer: Medicare Other

## 2013-09-02 ENCOUNTER — Telehealth: Payer: Self-pay | Admitting: Family Medicine

## 2013-09-02 DIAGNOSIS — Z79899 Other long term (current) drug therapy: Secondary | ICD-10-CM

## 2013-09-02 DIAGNOSIS — J9610 Chronic respiratory failure, unspecified whether with hypoxia or hypercapnia: Secondary | ICD-10-CM | POA: Diagnosis present

## 2013-09-02 DIAGNOSIS — R001 Bradycardia, unspecified: Secondary | ICD-10-CM | POA: Diagnosis present

## 2013-09-02 DIAGNOSIS — F411 Generalized anxiety disorder: Secondary | ICD-10-CM

## 2013-09-02 DIAGNOSIS — E1165 Type 2 diabetes mellitus with hyperglycemia: Secondary | ICD-10-CM

## 2013-09-02 DIAGNOSIS — E871 Hypo-osmolality and hyponatremia: Secondary | ICD-10-CM | POA: Diagnosis present

## 2013-09-02 DIAGNOSIS — S82009A Unspecified fracture of unspecified patella, initial encounter for closed fracture: Secondary | ICD-10-CM | POA: Diagnosis present

## 2013-09-02 DIAGNOSIS — I2510 Atherosclerotic heart disease of native coronary artery without angina pectoris: Secondary | ICD-10-CM | POA: Diagnosis present

## 2013-09-02 DIAGNOSIS — N179 Acute kidney failure, unspecified: Secondary | ICD-10-CM | POA: Diagnosis present

## 2013-09-02 DIAGNOSIS — F3289 Other specified depressive episodes: Secondary | ICD-10-CM

## 2013-09-02 DIAGNOSIS — S82002A Unspecified fracture of left patella, initial encounter for closed fracture: Secondary | ICD-10-CM

## 2013-09-02 DIAGNOSIS — I4589 Other specified conduction disorders: Secondary | ICD-10-CM

## 2013-09-02 DIAGNOSIS — J4489 Other specified chronic obstructive pulmonary disease: Secondary | ICD-10-CM | POA: Diagnosis present

## 2013-09-02 DIAGNOSIS — G8929 Other chronic pain: Secondary | ICD-10-CM | POA: Diagnosis present

## 2013-09-02 DIAGNOSIS — N289 Disorder of kidney and ureter, unspecified: Secondary | ICD-10-CM

## 2013-09-02 DIAGNOSIS — IMO0002 Reserved for concepts with insufficient information to code with codable children: Secondary | ICD-10-CM | POA: Diagnosis present

## 2013-09-02 DIAGNOSIS — I509 Heart failure, unspecified: Secondary | ICD-10-CM | POA: Diagnosis present

## 2013-09-02 DIAGNOSIS — I495 Sick sinus syndrome: Secondary | ICD-10-CM | POA: Diagnosis not present

## 2013-09-02 DIAGNOSIS — E1140 Type 2 diabetes mellitus with diabetic neuropathy, unspecified: Secondary | ICD-10-CM | POA: Diagnosis present

## 2013-09-02 DIAGNOSIS — I5031 Acute diastolic (congestive) heart failure: Secondary | ICD-10-CM

## 2013-09-02 DIAGNOSIS — J439 Emphysema, unspecified: Secondary | ICD-10-CM | POA: Diagnosis present

## 2013-09-02 DIAGNOSIS — I950 Idiopathic hypotension: Secondary | ICD-10-CM

## 2013-09-02 DIAGNOSIS — Z87891 Personal history of nicotine dependence: Secondary | ICD-10-CM

## 2013-09-02 DIAGNOSIS — I5032 Chronic diastolic (congestive) heart failure: Secondary | ICD-10-CM | POA: Diagnosis present

## 2013-09-02 DIAGNOSIS — K7460 Unspecified cirrhosis of liver: Secondary | ICD-10-CM

## 2013-09-02 DIAGNOSIS — T502X5A Adverse effect of carbonic-anhydrase inhibitors, benzothiadiazides and other diuretics, initial encounter: Secondary | ICD-10-CM | POA: Diagnosis present

## 2013-09-02 DIAGNOSIS — E119 Type 2 diabetes mellitus without complications: Secondary | ICD-10-CM

## 2013-09-02 DIAGNOSIS — M549 Dorsalgia, unspecified: Secondary | ICD-10-CM | POA: Diagnosis present

## 2013-09-02 DIAGNOSIS — K219 Gastro-esophageal reflux disease without esophagitis: Secondary | ICD-10-CM | POA: Diagnosis present

## 2013-09-02 DIAGNOSIS — F329 Major depressive disorder, single episode, unspecified: Secondary | ICD-10-CM

## 2013-09-02 DIAGNOSIS — E785 Hyperlipidemia, unspecified: Secondary | ICD-10-CM | POA: Diagnosis present

## 2013-09-02 DIAGNOSIS — I5033 Acute on chronic diastolic (congestive) heart failure: Secondary | ICD-10-CM

## 2013-09-02 DIAGNOSIS — J449 Chronic obstructive pulmonary disease, unspecified: Secondary | ICD-10-CM | POA: Diagnosis present

## 2013-09-02 DIAGNOSIS — C3490 Malignant neoplasm of unspecified part of unspecified bronchus or lung: Secondary | ICD-10-CM | POA: Diagnosis present

## 2013-09-02 DIAGNOSIS — Z933 Colostomy status: Secondary | ICD-10-CM

## 2013-09-02 DIAGNOSIS — I959 Hypotension, unspecified: Secondary | ICD-10-CM | POA: Diagnosis present

## 2013-09-02 DIAGNOSIS — Z794 Long term (current) use of insulin: Secondary | ICD-10-CM

## 2013-09-02 DIAGNOSIS — I10 Essential (primary) hypertension: Secondary | ICD-10-CM | POA: Diagnosis present

## 2013-09-02 DIAGNOSIS — Z8541 Personal history of malignant neoplasm of cervix uteri: Secondary | ICD-10-CM

## 2013-09-02 DIAGNOSIS — Z9981 Dependence on supplemental oxygen: Secondary | ICD-10-CM

## 2013-09-02 DIAGNOSIS — R079 Chest pain, unspecified: Secondary | ICD-10-CM | POA: Diagnosis present

## 2013-09-02 LAB — BASIC METABOLIC PANEL
ANION GAP: 13 (ref 5–15)
BUN: 47 mg/dL — ABNORMAL HIGH (ref 6–23)
CALCIUM: 10.1 mg/dL (ref 8.4–10.5)
CO2: 27 mEq/L (ref 19–32)
CREATININE: 1.53 mg/dL — AB (ref 0.50–1.10)
Chloride: 88 mEq/L — ABNORMAL LOW (ref 96–112)
GFR calc Af Amer: 41 mL/min — ABNORMAL LOW (ref >90)
GFR calc non Af Amer: 36 mL/min — ABNORMAL LOW (ref >90)
Glucose, Bld: 58 mg/dL — ABNORMAL LOW (ref 70–99)
Potassium: 4.4 mEq/L (ref 3.7–5.3)
Sodium: 128 mEq/L — ABNORMAL LOW (ref 137–147)

## 2013-09-02 LAB — COMPREHENSIVE METABOLIC PANEL
ALBUMIN: 3.5 g/dL (ref 3.5–5.2)
ALT: 16 U/L (ref 0–35)
AST: 26 U/L (ref 0–37)
Alkaline Phosphatase: 87 U/L (ref 39–117)
Anion gap: 14 (ref 5–15)
BUN: 48 mg/dL — ABNORMAL HIGH (ref 6–23)
CALCIUM: 10.2 mg/dL (ref 8.4–10.5)
CO2: 27 mEq/L (ref 19–32)
CREATININE: 1.5 mg/dL — AB (ref 0.50–1.10)
Chloride: 88 mEq/L — ABNORMAL LOW (ref 96–112)
GFR calc Af Amer: 42 mL/min — ABNORMAL LOW (ref >90)
GFR calc non Af Amer: 36 mL/min — ABNORMAL LOW (ref >90)
Glucose, Bld: 56 mg/dL — ABNORMAL LOW (ref 70–99)
Potassium: 4.3 mEq/L (ref 3.7–5.3)
Sodium: 129 mEq/L — ABNORMAL LOW (ref 137–147)
Total Bilirubin: 0.2 mg/dL — ABNORMAL LOW (ref 0.3–1.2)
Total Protein: 7.5 g/dL (ref 6.0–8.3)

## 2013-09-02 LAB — CBC WITH DIFFERENTIAL/PLATELET
BASOS ABS: 0.1 10*3/uL (ref 0.0–0.1)
Basophils Relative: 1 % (ref 0–1)
EOS ABS: 0.9 10*3/uL — AB (ref 0.0–0.7)
EOS PCT: 8 % — AB (ref 0–5)
HEMATOCRIT: 28.1 % — AB (ref 36.0–46.0)
Hemoglobin: 9 g/dL — ABNORMAL LOW (ref 12.0–15.0)
LYMPHS PCT: 18 % (ref 12–46)
Lymphs Abs: 2 10*3/uL (ref 0.7–4.0)
MCH: 26.4 pg (ref 26.0–34.0)
MCHC: 32 g/dL (ref 30.0–36.0)
MCV: 82.4 fL (ref 78.0–100.0)
MONO ABS: 2.2 10*3/uL — AB (ref 0.1–1.0)
Monocytes Relative: 19 % — ABNORMAL HIGH (ref 3–12)
Neutro Abs: 6.3 10*3/uL (ref 1.7–7.7)
Neutrophils Relative %: 55 % (ref 43–77)
Platelets: 427 10*3/uL — ABNORMAL HIGH (ref 150–400)
RBC: 3.41 MIL/uL — ABNORMAL LOW (ref 3.87–5.11)
RDW: 16.6 % — AB (ref 11.5–15.5)
WBC: 11.6 10*3/uL — AB (ref 4.0–10.5)

## 2013-09-02 LAB — GLUCOSE, CAPILLARY
GLUCOSE-CAPILLARY: 61 mg/dL — AB (ref 70–99)
Glucose-Capillary: 76 mg/dL (ref 70–99)

## 2013-09-02 LAB — SEDIMENTATION RATE: SED RATE: 58 mm/h — AB (ref 0–22)

## 2013-09-02 LAB — PRO B NATRIURETIC PEPTIDE: Pro B Natriuretic peptide (BNP): 558 pg/mL — ABNORMAL HIGH (ref 0–125)

## 2013-09-02 LAB — TROPONIN I: Troponin I: <0.3 ng/mL (ref <0.30)

## 2013-09-02 MED ORDER — DIAZEPAM 5 MG PO TABS
5.0000 mg | ORAL_TABLET | Freq: Three times a day (TID) | ORAL | Status: DC | PRN
  Administered 2013-09-06: 5 mg via ORAL
  Filled 2013-09-02: qty 1

## 2013-09-02 MED ORDER — NITROGLYCERIN 2 % TD OINT
1.0000 [in_us] | TOPICAL_OINTMENT | Freq: Once | TRANSDERMAL | Status: AC
  Administered 2013-09-02: 1 [in_us] via TOPICAL
  Filled 2013-09-02: qty 1

## 2013-09-02 MED ORDER — ASPIRIN 81 MG PO CHEW
324.0000 mg | CHEWABLE_TABLET | Freq: Once | ORAL | Status: AC
  Administered 2013-09-02: 324 mg via ORAL
  Filled 2013-09-02: qty 4

## 2013-09-02 MED ORDER — INSULIN GLARGINE 100 UNIT/ML ~~LOC~~ SOLN
20.0000 [IU] | Freq: Two times a day (BID) | SUBCUTANEOUS | Status: DC
  Filled 2013-09-02: qty 0.2

## 2013-09-02 MED ORDER — PANTOPRAZOLE SODIUM 40 MG PO TBEC
40.0000 mg | DELAYED_RELEASE_TABLET | Freq: Every day | ORAL | Status: DC
  Administered 2013-09-03 – 2013-09-08 (×5): 40 mg via ORAL
  Filled 2013-09-02 (×6): qty 1

## 2013-09-02 MED ORDER — ISOSORBIDE DINITRATE 20 MG PO TABS
20.0000 mg | ORAL_TABLET | Freq: Two times a day (BID) | ORAL | Status: DC
  Administered 2013-09-02: 20 mg via ORAL
  Filled 2013-09-02 (×3): qty 1

## 2013-09-02 MED ORDER — IPRATROPIUM-ALBUTEROL 0.5-2.5 (3) MG/3ML IN SOLN: 3.0000 mL | RESPIRATORY_TRACT | Status: DC | PRN

## 2013-09-02 MED ORDER — METHOCARBAMOL 500 MG PO TABS
500.0000 mg | ORAL_TABLET | Freq: Four times a day (QID) | ORAL | Status: DC | PRN
  Filled 2013-09-02: qty 1

## 2013-09-02 MED ORDER — INSULIN ASPART 100 UNIT/ML ~~LOC~~ SOLN: 0.0000 [IU] | Freq: Every day | SUBCUTANEOUS | Status: DC

## 2013-09-02 MED ORDER — ONDANSETRON HCL 4 MG/2ML IJ SOLN
4.0000 mg | Freq: Four times a day (QID) | INTRAMUSCULAR | Status: DC | PRN
  Administered 2013-09-05: 4 mg via INTRAVENOUS
  Filled 2013-09-02: qty 2

## 2013-09-02 MED ORDER — INSULIN ASPART 100 UNIT/ML ~~LOC~~ SOLN
0.0000 [IU] | Freq: Three times a day (TID) | SUBCUTANEOUS | Status: DC
Start: 2013-09-03 — End: 2013-09-08
  Administered 2013-09-03: 2 [IU] via SUBCUTANEOUS
  Administered 2013-09-03: 3 [IU] via SUBCUTANEOUS
  Administered 2013-09-04: 5 [IU] via SUBCUTANEOUS
  Administered 2013-09-04: 1 [IU] via SUBCUTANEOUS
  Administered 2013-09-04: 3 [IU] via SUBCUTANEOUS
  Administered 2013-09-05 – 2013-09-06 (×5): 2 [IU] via SUBCUTANEOUS
  Administered 2013-09-06: 3 [IU] via SUBCUTANEOUS
  Administered 2013-09-08: 2 [IU] via SUBCUTANEOUS

## 2013-09-02 MED ORDER — FOLIC ACID 1 MG PO TABS
1.0000 mg | ORAL_TABLET | Freq: Every evening | ORAL | Status: DC
  Administered 2013-09-02 – 2013-09-08 (×7): 1 mg via ORAL
  Filled 2013-09-02 (×8): qty 1

## 2013-09-02 MED ORDER — MOMETASONE FURO-FORMOTEROL FUM 100-5 MCG/ACT IN AERO
2.0000 | INHALATION_SPRAY | Freq: Two times a day (BID) | RESPIRATORY_TRACT | Status: DC
  Administered 2013-09-03 – 2013-09-08 (×11): 2 via RESPIRATORY_TRACT
  Filled 2013-09-02: qty 8.8

## 2013-09-02 MED ORDER — FUROSEMIDE 40 MG PO TABS
40.0000 mg | ORAL_TABLET | Freq: Two times a day (BID) | ORAL | Status: DC
  Administered 2013-09-03: 40 mg via ORAL
  Filled 2013-09-02 (×2): qty 1

## 2013-09-02 MED ORDER — LOSARTAN POTASSIUM 50 MG PO TABS
50.0000 mg | ORAL_TABLET | Freq: Every day | ORAL | Status: DC
  Filled 2013-09-02: qty 1

## 2013-09-02 MED ORDER — DICYCLOMINE HCL 10 MG PO CAPS
10.0000 mg | ORAL_CAPSULE | Freq: Three times a day (TID) | ORAL | Status: DC
  Administered 2013-09-02 – 2013-09-08 (×16): 10 mg via ORAL
  Filled 2013-09-02 (×20): qty 1

## 2013-09-02 MED ORDER — IPRATROPIUM-ALBUTEROL 18-103 MCG/ACT IN AERO: 2.0000 | INHALATION_SPRAY | RESPIRATORY_TRACT | Status: DC | PRN

## 2013-09-02 MED ORDER — METOCLOPRAMIDE HCL 10 MG PO TABS
10.0000 mg | ORAL_TABLET | Freq: Three times a day (TID) | ORAL | Status: DC
  Administered 2013-09-02 – 2013-09-08 (×16): 10 mg via ORAL
  Filled 2013-09-02 (×21): qty 1

## 2013-09-02 MED ORDER — TRAZODONE HCL 100 MG PO TABS
200.0000 mg | ORAL_TABLET | Freq: Every day | ORAL | Status: DC
  Administered 2013-09-02: 200 mg via ORAL
  Filled 2013-09-02 (×2): qty 2

## 2013-09-02 MED ORDER — INSULIN GLARGINE 100 UNIT/ML SOLOSTAR PEN: 20.0000 [IU] | PEN_INJECTOR | Freq: Two times a day (BID) | SUBCUTANEOUS | Status: DC

## 2013-09-02 MED ORDER — POLYSACCHARIDE IRON COMPLEX 150 MG PO CAPS
150.0000 mg | ORAL_CAPSULE | Freq: Two times a day (BID) | ORAL | Status: DC
  Administered 2013-09-02 – 2013-09-08 (×10): 150 mg via ORAL
  Filled 2013-09-02 (×13): qty 1

## 2013-09-02 MED ORDER — PROMETHAZINE HCL 25 MG PO TABS: 25.0000 mg | ORAL_TABLET | Freq: Four times a day (QID) | ORAL | Status: DC | PRN

## 2013-09-02 MED ORDER — RIFAXIMIN 550 MG PO TABS
550.0000 mg | ORAL_TABLET | Freq: Two times a day (BID) | ORAL | Status: DC
  Administered 2013-09-02 – 2013-09-08 (×11): 550 mg via ORAL
  Filled 2013-09-02 (×14): qty 1

## 2013-09-02 MED ORDER — HYDRALAZINE HCL 25 MG PO TABS
37.5000 mg | ORAL_TABLET | Freq: Two times a day (BID) | ORAL | Status: DC
  Administered 2013-09-02: 37.5 mg via ORAL
  Filled 2013-09-02 (×3): qty 1.5

## 2013-09-02 MED ORDER — OXYCODONE HCL 5 MG PO TABS
20.0000 mg | ORAL_TABLET | ORAL | Status: DC | PRN
  Administered 2013-09-02 – 2013-09-03 (×7): 20 mg via ORAL
  Administered 2013-09-04 (×2): 10 mg via ORAL
  Filled 2013-09-02 (×10): qty 4

## 2013-09-02 NOTE — ED Notes (Signed)
Spoke with RN assigned to PT on floor. RN states her charge RN told her she cannot take the patient until she is chest pain free. MD Posey Pronto notified.

## 2013-09-02 NOTE — ED Notes (Signed)
182.2 

## 2013-09-02 NOTE — ED Notes (Signed)
Pt from home via [facility] with c/o chest pressure and increased SOB starting today.  Pt states she woke up and had gained 5 lbs since yesterday, with increased swelling in her abdomen and bilateral legs, hx of CHF.  Pt in NAD, A&O.

## 2013-09-02 NOTE — ED Notes (Signed)
MD informed of pt weight.

## 2013-09-02 NOTE — ED Provider Notes (Signed)
CSN: [id number]     Arrival date & time 09/02/13  1714 History   First MD Initiated Contact with Patient 09/02/13 1750     Chief Complaint  Patient presents with  . Chest Pain      HPI Pt was seen at 1750. Per EMS and pt, c/o gradual onset and worsening of persistent CP, SOB and peripheral edema for the past 1 week, worse today. Pt states she "gained 5 lbs since yesterday." Pt states her symptoms worsen when she lays down and when she ambulates. Pt describes her CP as "heaviness" which worsens when she exerts herself, lasts for approximately 30 minutes before resolving. Pt has not taken any meds for her symptoms. Denies palpitations, no cough, no abd pain, no N/V/D, no back pain, no calf/LE pain or unilateral swelling.    Past Medical History  Diagnosis Date  . Cirrhosis   . GERD (gastroesophageal reflux disease)   . Cervical disc syndrome   . Lung cancer     squamous cell  . Chronic respiratory failure   . Diverticulitis of colon   . Perforation of colon   . Arthritis   . IBS (irritable bowel syndrome)   . Chronic lower GI bleeding   . Overactive bladder   . Cervical cancer   . Thrombocytopenia     sees Dr. Julien Nordmann   . Splenomegaly   . Depression   . Hypertension   . Asthma   . Chronic headache disorder   . Blood transfusion without reported diagnosis   . Heart murmur   . Hyperlipidemia   . Lung cancer   . COPD (chronic obstructive pulmonary disease)     sees Dr. Gwenette Greet   . Anemia     sees Dr. Julien Nordmann, due to chronic disease and GI losses   . Diabetes mellitus     sees Dr. Cruzita Lederer   . Colostomy care   . Hypercalcemia    Past Surgical History  Procedure Laterality Date  . Appendectomy    . Cholecystectomy    . Colostomy    . Pneumonectomy    . Cervix removed    . Bowel resection    . Esophagogastroduodenoscopy  03/19/2011    Procedure: ESOPHAGOGASTRODUODENOSCOPY (EGD);  Surgeon: [name], MD;  Location: [facility];  Service: Endoscopy;  Laterality: N/A;   . Colonoscopy  03/19/2011    Procedure: COLONOSCOPY;  Surgeon: [name], MD;  Location: [facility];  Service: Endoscopy;  Laterality: N/A;  . Givens capsule study  03/20/2011    Procedure: GIVENS CAPSULE STUDY;  Surgeon: [name], MD;  Location: [facility];  Service: Endoscopy;  Laterality: N/A;  . Small bowel obstruction repair  March 2012  . Umbilical hernia repair  March 2012  . Splenectomy, total N/A 02/24/2013    Procedure: SPLENECTOMY;  Surgeon: Harl Bowie, MD;  Location: [facility];  Service: General;  Laterality: N/A;  . Orif patella fracture Left 04/21/2013    DR [name]  . Orif patella Left 04/21/2013    Procedure: OPEN REDUCTION INTERNAL (ORIF) FIXATION PATELLA;  Surgeon: Augustin Schooling, MD;  Location: [place];  Service: Orthopedics;  Laterality: Left;   Family History  Problem Relation Age of Onset  . Coronary artery disease    . Diabetes type II    . Anesthesia problems Neg Hx   . Hypotension Neg Hx   . Malignant hyperthermia Neg Hx   . Pseudochol deficiency Neg Hx   . Heart attack Mother   .  Diabetes type II Mother   . Cirrhosis Father    History  Substance Use Topics  . Smoking status: Former Smoker -- 2.00 packs/day for 35 years    Types: Cigarettes    Quit date: 03/24/2002  . Smokeless tobacco: Never Used  . Alcohol Use: No    Review of Systems ROS: Statement: All systems negative except as marked or noted in the HPI; Constitutional: Negative for fever and chills. ; ; Eyes: Negative for eye pain, redness and discharge. ; ; ENMT: Negative for ear pain, hoarseness, nasal congestion, sinus pressure and sore throat. ; ; Cardiovascular: Negative for palpitations, diaphoresis, +CP, dyspnea and peripheral edema. ; ; Respiratory: Negative for cough, wheezing and stridor. ; ; Gastrointestinal: Negative for nausea, vomiting, diarrhea, abdominal pain, blood in stool, hematemesis, jaundice and rectal bleeding. . ; ; Genitourinary: Negative for dysuria, flank  pain and hematuria. ; ; Musculoskeletal: Negative for back pain and neck pain. Negative for swelling and trauma.; ; Skin: Negative for pruritus, rash, abrasions, blisters, bruising and skin lesion.; ; Neuro: Negative for headache, lightheadedness and neck stiffness. Negative for weakness, altered level of consciousness , altered mental status, extremity weakness, paresthesias, involuntary movement, seizure and syncope.      Allergies  Acetaminophen; Morphine; Other; Penicillins; Codeine phosphate; Hydrocodone-acetaminophen; Cephalexin; and Hydrocodone-acetaminophen  Home Medications   Prior to Admission medications   Medication Sig Start Date End Date Taking? Authorizing Provider  albuterol-ipratropium (COMBIVENT) 18-103 MCG/ACT inhaler Inhale 2 puffs into the lungs every 4 (four) hours as needed for wheezing or shortness of breath.    Yes Historical Provider, MD  diazepam (VALIUM) 5 MG tablet Take 1 tablet (5 mg total) by mouth 3 (three) times daily as needed for anxiety. 07/29/13  Yes [name], MD  dicyclomine (BENTYL) 10 MG capsule Take 10 mg by mouth 3 (three) times daily.    Yes Historical Provider, MD  esomeprazole (NEXIUM) 40 MG capsule Take 40 mg by mouth every evening.   Yes Historical Provider, MD  Fluticasone-Salmeterol (ADVAIR) 250-50 MCG/DOSE AEPB Inhale 1 puff into the lungs every 12 (twelve) hours. 04/12/13  Yes [name], MD  folic acid (FOLVITE) 1 MG tablet Take 1 tablet (1 mg total) by mouth every evening. 04/12/13  Yes [name], MD  furosemide (LASIX) 40 MG tablet Take 40 mg by mouth 2 (two) times daily. Take 80 mg in the AM and 40 mg in the PM 07/17/13  Yes Barton Dubois, MD  hydrALAZINE (APRESOLINE) 25 MG tablet Take 1.5 tablets (37.5 mg total) by mouth 2 (two) times daily. 07/19/13  Yes Troy Sine, MD  Insulin Glargine (LANTUS SOLOSTAR) 100 UNIT/ML Solostar Pen Inject 40 Units into the skin 2 (two) times daily.    Yes Historical Provider, MD  iron polysaccharides  (NIFEREX) 150 MG capsule Take 1 capsule (150 mg total) by mouth 2 (two) times daily. 07/17/13  Yes Barton Dubois, MD  isosorbide dinitrate (ISORDIL) 20 MG tablet Take 1 tablet (20 mg total) by mouth 2 (two) times daily. 07/19/13  Yes Troy Sine, MD  losartan (COZAAR) 50 MG tablet Take 1 tablet (50 mg total) by mouth daily. 07/17/13  Yes Barton Dubois, MD  methocarbamol (ROBAXIN) 500 MG tablet Take 1 tablet (500 mg total) by mouth every 6 (six) hours as needed for muscle spasms. 05/03/13  Yes Ivan Anchors Love, PA-C  metoCLOPramide (REGLAN) 10 MG tablet Take 1 tablet (10 mg total) by mouth 3 (three) times daily. 04/12/13  Yes  [name], MD  Oxycodone HCl 20 MG TABS Take 1 tablet (20 mg total) by mouth every 3 (three) hours as needed (pain). 07/29/13  Yes [name], MD  promethazine (PHENERGAN) 25 MG tablet Take 25 mg by mouth every 6 (six) hours as needed for nausea or vomiting.    Yes Historical Provider, MD  rifaximin (XIFAXAN) 550 MG TABS tablet Take 550 mg by mouth 2 (two) times daily. 06/29/12  Yes [name], MD  traZODone (DESYREL) 100 MG tablet Take 2 tablets (200 mg total) by mouth at bedtime. 07/29/13  Yes [name], MD   BP 135/45  Resp 16  SpO2 99% Physical Exam 1755: Physical examination:  Nursing notes reviewed; Vital signs and O2 SAT reviewed;  Constitutional: Well developed, Well nourished, Well hydrated, In no acute distress; Head:  Normocephalic, atraumatic; Eyes: EOMI, PERRL, No scleral icterus; ENMT: Mouth and pharynx normal, Mucous membranes moist; Neck: Supple, Full range of motion, No lymphadenopathy; Cardiovascular: Regular rate and rhythm, No gallop; Respiratory: Breath sounds coarse & equal bilaterally, No wheezes.  Speaking full sentences with ease, Normal respiratory effort/excursion; Chest: Nontender, Movement normal; Abdomen: Soft, Nontender, Nondistended, Normal bowel sounds; Genitourinary: No CVA tenderness; Extremities: Pulses normal, No tenderness, +2 pedal edema  bilat without calf asymmetry.; Neuro: AA&Ox3, Major CN grossly intact.  Speech clear. No gross focal motor or sensory deficits in extremities.; Skin: Color normal, Warm, Dry.   ED Course  Procedures      EKG Interpretation None      MDM  MDM Reviewed: previous chart, nursing note and vitals Reviewed previous: labs and ECG Interpretation: labs, ECG and x-ray Total time providing critical care: 30-74 minutes. This excludes time spent performing separately reportable procedures and services. Consults: admitting MD   CRITICAL CARE Performed by: Alfonzo Feller Total critical care time: 35 Critical care time was exclusive of separately billable procedures and treating other patients. Critical care was necessary to treat or prevent imminent or life-threatening deterioration. Critical care was time spent personally by me on the following activities: development of treatment plan with patient and/or surrogate as well as nursing, discussions with consultants, evaluation of patient's response to treatment, examination of patient, obtaining history from patient or surrogate, ordering and performing treatments and interventions, ordering and review of laboratory studies, ordering and review of radiographic studies, pulse oximetry and re-evaluation of patient's condition.    Date: 09/02/2013  Rate: 68  Rhythm: normal sinus rhythm  QRS Axis: normal  Intervals: normal  ST/T Wave abnormalities: normal  Conduction Disutrbances:none  Narrative Interpretation:   Old EKG Reviewed: changes noted; previous EKG dated 07/12/2013 was junctional rhythm with NS TWA which are improved on today's EKG.   Results for orders placed during the hospital encounter of 09/02/13  CBC WITH DIFFERENTIAL      Result Value Ref Range   WBC 11.6 (*) 4.0 - 10.5 K/uL   RBC 3.41 (*) 3.87 - 5.11 MIL/uL   Hemoglobin 9.0 (*) 12.0 - 15.0 g/dL   HCT 28.1 (*) 36.0 - 46.0 %   MCV 82.4  78.0 - 100.0 fL   MCH 26.4  26.0 -  34.0 pg   MCHC 32.0  30.0 - 36.0 g/dL   RDW 16.6 (*) 11.5 - 15.5 %   Platelets 427 (*) 150 - 400 K/uL   Neutrophils Relative % 55  43 - 77 %   Neutro Abs 6.3  1.7 - 7.7 K/uL   Lymphocytes Relative 18  12 - 46 %  Lymphs Abs 2.0  0.7 - 4.0 K/uL   Monocytes Relative 19 (*) 3 - 12 %   Monocytes Absolute 2.2 (*) 0.1 - 1.0 K/uL   Eosinophils Relative 8 (*) 0 - 5 %   Eosinophils Absolute 0.9 (*) 0.0 - 0.7 K/uL   Basophils Relative 1  0 - 1 %   Basophils Absolute 0.1  0.0 - 0.1 K/uL  BASIC METABOLIC PANEL      Result Value Ref Range   Sodium 128 (*) 137 - 147 mEq/L   Potassium 4.4  3.7 - 5.3 mEq/L   Chloride 88 (*) 96 - 112 mEq/L   CO2 27  19 - 32 mEq/L   Glucose, Bld 58 (*) 70 - 99 mg/dL   BUN 47 (*) 6 - 23 mg/dL   Creatinine, Ser 1.53 (*) 0.50 - 1.10 mg/dL   Calcium 10.1  8.4 - 10.5 mg/dL   GFR calc non Af Amer 36 (*) >90 mL/min   GFR calc Af Amer 41 (*) >90 mL/min   Anion gap 13  5 - 15  TROPONIN I      Result Value Ref Range   Troponin I <0.30  <0.30 ng/mL  PRO B NATRIURETIC PEPTIDE      Result Value Ref Range   Pro B Natriuretic peptide (BNP) 558.0 (*) 0 - 125 pg/mL   Dg Chest 2 View 09/02/2013   CLINICAL DATA:  Chest pain  EXAM: CHEST  2 VIEW  COMPARISON:  07/12/2013  FINDINGS: Stable cardiomegaly with chronic vascular and interstitial prominence. Postop changes in left hilum/mediastinum. Parenchymal scarring in the left lower lobe. No new superimposed pneumonia, collapse or consolidation. No effusion or pneumothorax. Trachea midline. Prior cholecystectomy noted.  IMPRESSION: Stable cardiomegaly with vascular congestion and postop findings.   Electronically Signed   By: Daryll Brod M.D.   On: 09/02/2013 19:05    Results for [name], [name] (MRN [id number]) as of 09/02/2013 19:13  Ref. Range 07/14/2013 05:30 07/16/2013 06:50 07/17/2013 04:07 07/29/2013 12:39 09/02/2013 18:03  Sodium Latest Range: 136-145 mEq/L 139 139 135 (L) 136 128 (L)  BUN Latest Range: 7.0-26.0 mg/dL 32 (H) 31 (H)  35 (H) 36 (H) 47 (H)  Creatinine Latest Range: 0.50-1.10 mg/dL 0.97 1.07 1.10 1.2 1.53 (H)    Results for [name], [name] (MRN [id number]) as of 09/02/2013 19:13  Ref. Range 07/12/2013 22:32 07/17/2013 04:07 07/29/2013 12:39 09/02/2013 18:03  Hemoglobin Latest Range: 12.0-15.0 g/dL 8.8 (L) 9.4 (L) 9.6 (L) 9.0 (L)  HCT Latest Range: 36.0-46.0 % 28.0 (L) 30.4 (L) 30.6 (L) 28.1 (L)    1930:  Na lower than previous, BUN/Cr elevated; will continue judicious IVF. CP improved after ASA and ntg paste. Dx and testing d/w pt.  Questions answered.  Verb understanding, agreeable to admit.   T/C to [facility] Dr. Sloan Leiter, case discussed, including:  HPI, pertinent PM/SHx, VS/PE, dx testing, ED course and treatment:  Agreeable to admit, requests to write temporary orders, obtain observation tele bed to team 10.   Francine Graven, DO 09/04/13 1421

## 2013-09-02 NOTE — ED Notes (Signed)
Patient transported to X-ray 

## 2013-09-02 NOTE — Telephone Encounter (Signed)
[name] from [facility] would like for you to call her back regarding,Pt called and recorded that she has had a 5 pound weight gain within 24 hrs, [name] states that [name] had changed her lasix so she not sure if that has something to do with it or not.

## 2013-09-02 NOTE — ED Notes (Signed)
Attempted report 

## 2013-09-02 NOTE — H&P (Signed)
[facility] History and Physical  Patient: [name]  QQI:[id number]  DOB: 1950-12-20  DOS: the patient was seen and examined on 09/02/2013 PCP: [name], MD  Chief Complaint: Chest pain and shortness of breath  HPI: [name] is a 63 y.o. female with Past medical history of cirrhosis of the liver nor alcohol, GERD, squamous cell lung cancer, irritable bowel syndrome, chronic diastolic dysfunction, COPD mild, chronic respiratory failure on oxygen on exertion at home. The patient presented with complaints of 5 pound weight gain, chest pressure, shortness of breath and worsening leg swelling. She mentions that recently she has gained some weight and was asked to increase her Lasix from 120 mg daily dose to go to 160 mg daily dose for one week which she completed yesterday. Today when she woke up in the morning she found that her weight has increased from 182 pounds to 187 pounds. At that point she did not have any complaints of shortness of breath or chest pain more than her usual. After that as the day progressed she started having progressively worsening shortness of breath, progressively worsening leg swelling, chest pressure which was on and off lasting for few minutes and resolving on its own without any radiation. She also complains of dizziness and lightheadedness even at rest. She mentions yesterday when her home health nurse came she found that her blood pressure was something over 40, and today of family member checked her blood pressure in a dose 100/50. No recent change in her medication. She has been eating okay. She has chronic diarrhea and has a colostomy bag. She denies any active bleeding. She has been urinating adequately. She denies any fever chills or cough. She has irritable bowel syndrome and chronic abdominal pain and has been having some pain medication. She has been requesting Dilaudid for pain.  The patient is coming from home. And at her baseline  independent for most of her ADL.  Review of Systems: as mentioned in the history of present illness.  A Comprehensive review of the other systems is negative.  Past Medical History  Diagnosis Date  . Cirrhosis   . GERD (gastroesophageal reflux disease)   . Cervical disc syndrome   . Lung cancer     squamous cell  . Chronic respiratory failure   . Diverticulitis of colon   . Perforation of colon   . Arthritis   . IBS (irritable bowel syndrome)   . Chronic lower GI bleeding   . Overactive bladder   . Cervical cancer   . Thrombocytopenia     sees Dr. Julien Nordmann   . Splenomegaly   . Depression   . Hypertension   . Asthma   . Chronic headache disorder   . Blood transfusion without reported diagnosis   . Heart murmur   . Hyperlipidemia   . Lung cancer   . COPD (chronic obstructive pulmonary disease)     sees Dr. Gwenette Greet   . Anemia     sees Dr. Julien Nordmann, due to chronic disease and GI losses   . Diabetes mellitus     sees Dr. Cruzita Lederer   . Colostomy care   . Hypercalcemia    Past Surgical History  Procedure Laterality Date  . Appendectomy    . Cholecystectomy    . Colostomy    . Pneumonectomy    . Cervix removed    . Bowel resection    . Esophagogastroduodenoscopy  03/19/2011    Procedure: ESOPHAGOGASTRODUODENOSCOPY (EGD);  Surgeon: Sandy Salaam  [name], MD;  Location: [facility];  Service: Endoscopy;  Laterality: N/A;  . Colonoscopy  03/19/2011    Procedure: COLONOSCOPY;  Surgeon: [name], MD;  Location: [facility];  Service: Endoscopy;  Laterality: N/A;  . Givens capsule study  03/20/2011    Procedure: GIVENS CAPSULE STUDY;  Surgeon: [name], MD;  Location: [facility];  Service: Endoscopy;  Laterality: N/A;  . Small bowel obstruction repair  March 2012  . Umbilical hernia repair  March 2012  . Splenectomy, total N/A 02/24/2013    Procedure: SPLENECTOMY;  Surgeon: Harl Bowie, MD;  Location: [facility];  Service: General;  Laterality: N/A;  . Orif patella  fracture Left 04/21/2013    DR [name]  . Orif patella Left 04/21/2013    Procedure: OPEN REDUCTION INTERNAL (ORIF) FIXATION PATELLA;  Surgeon: Augustin Schooling, MD;  Location: [place];  Service: Orthopedics;  Laterality: Left;   Social History:  reports that she quit smoking about 11 years ago. Her smoking use included Cigarettes. She has a 70 pack-year smoking history. She has never used smokeless tobacco. She reports that she does not drink alcohol or use illicit drugs.  Allergies  Allergen Reactions  . Acetaminophen Other (See Comments)    Cirrhosis of liver  . Morphine Other (See Comments)    REACTION: Lowers BP  . Other Other (See Comments)    AGENT:  Per pt, cannot take blood thinners due to cirrhosis of the liver  . Penicillins Anaphylaxis and Rash  . Codeine Phosphate Other (See Comments)    REACTION: Stomach cramps  . Hydrocodone-Acetaminophen Other (See Comments)    REACTION: hallucinations  . Cephalexin Swelling and Rash  . Hydrocodone-Acetaminophen Other (See Comments)    unknown    Family History  Problem Relation Age of Onset  . Coronary artery disease    . Diabetes type II    . Anesthesia problems Neg Hx   . Hypotension Neg Hx   . Malignant hyperthermia Neg Hx   . Pseudochol deficiency Neg Hx   . Heart attack Mother   . Diabetes type II Mother   . Cirrhosis Father     Prior to Admission medications   Medication Sig Start Date End Date Taking? Authorizing Provider  albuterol-ipratropium (COMBIVENT) 18-103 MCG/ACT inhaler Inhale 2 puffs into the lungs every 4 (four) hours as needed for wheezing or shortness of breath.    Yes Historical Provider, MD  diazepam (VALIUM) 5 MG tablet Take 1 tablet (5 mg total) by mouth 3 (three) times daily as needed for anxiety. 07/29/13  Yes [name], MD  dicyclomine (BENTYL) 10 MG capsule Take 10 mg by mouth 3 (three) times daily.    Yes Historical Provider, MD  esomeprazole (NEXIUM) 40 MG capsule Take 40 mg by mouth every evening.    Yes Historical Provider, MD  Fluticasone-Salmeterol (ADVAIR) 250-50 MCG/DOSE AEPB Inhale 1 puff into the lungs every 12 (twelve) hours. 04/12/13  Yes [name], MD  folic acid (FOLVITE) 1 MG tablet Take 1 tablet (1 mg total) by mouth every evening. 04/12/13  Yes [name], MD  furosemide (LASIX) 40 MG tablet Take 40 mg by mouth 2 (two) times daily. Take 80 mg in the AM and 40 mg in the PM 07/17/13  Yes Barton Dubois, MD  hydrALAZINE (APRESOLINE) 25 MG tablet Take 1.5 tablets (37.5 mg total) by mouth 2 (two) times daily. 07/19/13  Yes Troy Sine, MD  Insulin Glargine (LANTUS SOLOSTAR) 100  UNIT/ML Solostar Pen Inject 40 Units into the skin 2 (two) times daily.    Yes Historical Provider, MD  iron polysaccharides (NIFEREX) 150 MG capsule Take 1 capsule (150 mg total) by mouth 2 (two) times daily. 07/17/13  Yes Barton Dubois, MD  isosorbide dinitrate (ISORDIL) 20 MG tablet Take 1 tablet (20 mg total) by mouth 2 (two) times daily. 07/19/13  Yes Troy Sine, MD  losartan (COZAAR) 50 MG tablet Take 1 tablet (50 mg total) by mouth daily. 07/17/13  Yes Barton Dubois, MD  methocarbamol (ROBAXIN) 500 MG tablet Take 1 tablet (500 mg total) by mouth every 6 (six) hours as needed for muscle spasms. 05/03/13  Yes Ivan Anchors Love, PA-C  metoCLOPramide (REGLAN) 10 MG tablet Take 1 tablet (10 mg total) by mouth 3 (three) times daily. 04/12/13  Yes [name], MD  Oxycodone HCl 20 MG TABS Take 1 tablet (20 mg total) by mouth every 3 (three) hours as needed (pain). 07/29/13  Yes [name], MD  promethazine (PHENERGAN) 25 MG tablet Take 25 mg by mouth every 6 (six) hours as needed for nausea or vomiting.    Yes Historical Provider, MD  rifaximin (XIFAXAN) 550 MG TABS tablet Take 550 mg by mouth 2 (two) times daily. 06/29/12  Yes [name], MD  traZODone (DESYREL) 100 MG tablet Take 2 tablets (200 mg total) by mouth at bedtime. 07/29/13  Yes [name], MD    Physical Exam: Filed Vitals:   09/02/13 1830  09/02/13 1921 09/02/13 1930 09/02/13 2049  BP: 118/38 123/51 123/48   Pulse: 66  60   Resp: _0 Weight:    82.611 kg (182 lb 2 oz)  SpO2: 99% 100% 100%     General: Alert, Awake and Oriented to Time, Place and Person. Appear in mild distress, appears anxious, with teary eyes Eyes: PERRL ENT: Oral Mucosa clear moist. Neck: Mild JVD Cardiovascular: S1 and S2 Present, aortic systolic Murmur, Peripheral Pulses Present Respiratory: Bilateral Air entry equal and Decreased, clear on Auscultation, no Crackles, no wheezes Abdomen: Bowel Sound Present, Soft and mild diffuse tender Skin: No Rash Extremities: Bilateral Pedal edema, no calf tenderness Neurologic: Grossly no focal neuro deficit.  Labs on Admission:  CBC:  Recent Labs Lab 09/02/13 1803  WBC 11.6*  NEUTROABS 6.3  HGB 9.0*  HCT 28.1*  MCV 82.4  PLT 427*    CMP     Component Value Date/Time   NA 128* 09/02/2013 1803   NA 138 06/23/2013 0806   K 4.4 09/02/2013 1803   K 4.7 06/23/2013 0806   CL 88* 09/02/2013 1803   CL 103 03/23/2012 0938   CO2 27 09/02/2013 1803   CO2 21* 06/23/2013 0806   GLUCOSE 58* 09/02/2013 1803   GLUCOSE 223* 06/23/2013 0806   GLUCOSE 319* 03/23/2012 0938   BUN 47* 09/02/2013 1803   BUN 21.7 06/23/2013 0806   CREATININE 1.53* 09/02/2013 1803   CREATININE 1.1 06/23/2013 0806   CALCIUM 10.1 09/02/2013 1803   CALCIUM 10.3 06/23/2013 0806   PROT 7.7 07/12/2013 2232   PROT 7.2 06/23/2013 0806   ALBUMIN 3.3* 07/12/2013 2232   ALBUMIN 3.2* 06/23/2013 0806   AST 22 07/12/2013 2232   AST 43* 06/23/2013 0806   ALT 17 07/12/2013 2232   ALT 49 06/23/2013 0806   ALKPHOS 107 07/12/2013 2232   ALKPHOS 138 06/23/2013 0806   BILITOT <0.2* 07/12/2013 2232   BILITOT 0.31 06/23/2013 7858  GFRNONAA 36* 09/02/2013 1803   GFRAA 41* 09/02/2013 1803    No results found for this basename: LIPASE, AMYLASE,  in the last 168 hours No results found for this basename: AMMONIA,  in the last 168 hours   Recent Labs Lab 09/02/13 1803   TROPONINI <0.30   BNP (last 3 results)  Recent Labs  07/12/13 2232 07/16/13 0650 09/02/13 1803  PROBNP 4136.0* 933.6* 558.0*    Radiological Exams on Admission: Dg Chest 2 View  09/02/2013   CLINICAL DATA:  Chest pain  EXAM: CHEST  2 VIEW  COMPARISON:  07/12/2013  FINDINGS: Stable cardiomegaly with chronic vascular and interstitial prominence. Postop changes in left hilum/mediastinum. Parenchymal scarring in the left lower lobe. No new superimposed pneumonia, collapse or consolidation. No effusion or pneumothorax. Trachea midline. Prior cholecystectomy noted.  IMPRESSION: Stable cardiomegaly with vascular congestion and postop findings.   Electronically Signed   By: Daryll Brod M.D.   On: 09/02/2013 19:05   EKG: Independently reviewed. normal sinus rhythm, nonspecific ST and T waves changes. Assessment/Plan Principal Problem:   Hyponatremia Active Problems:   CARCINOMA, LUNG, SQUAMOUS CELL   Type II or unspecified type diabetes mellitus with neurological manifestations, uncontrolled   HYPERTENSION   COPD   GERD   Chronic respiratory failure   Cirrhosis of liver not due to alcohol   AKI (acute kidney injury)   Left patella fracture   Chronic diastolic heart failure   1. Hyponatremia Chronic diastolic heart failure Atypical chest pain Acute kidney injury COPD with chronic respiratory failure.  Patient is presenting with complaints of chest pain, shortness of breath, weight gain. Her weight here in the hospital is 182 pounds, at the time of her discharge from her recent hospitalization for acute CHF her weight was 184 pounds. Yesterday her weight was 182 pounds. She has mild elevation of her proBNP but not significant as compared to her prior admissions. EKG does not show any evidence of acute abnormality and serial troponin is negative. She has elevation of BUN and creatinine with mild hyponatremia. A negative stress test in June 2015. She does not appear to have any  wheezing on examination nor crackles. Her saturations are 97-100% on room air although she request to remain on 2 L of oxygen as it makes her feel better. Her symptoms of chest pressure and shortness of breath started after she found out she has gained weight at present appears anxious with teary eyes.  With this finding it does not appear that she has acute worsening of her chronic CHF, chronic COPD. I would continue her home inhaler and resume her regular dose of Lasix 120 mg daily dosing. I will place TEDS stocking for pedal edema.  Her chest pain can be noncardiogenic as well, due to her recent negative stress test. Due to her extensive cardiac history we will follow serial troponins and monitor her on telemetry. Currently she has nitroglycerin paste  With her hyponatremia it appears more likely secondary to Lasix and over diuresis along with acute kidney injury therefore reducing the dose back to her baseline would likely improve her sodium. We will monitor her sodium every 6 hours. She does not have any neurological deficit.  2.Diabetes mellitus  Patient is persistently hyperglycemic therefore I will discontinue her long-acting Lantus,  at present and place her on sliding scale. Lantus can be resumed tomorrow .  3. cirrhosis of liver  No signs of decompensation continue current management.  4.left patella open wound  patient has  a wound in the step with yellow discoloration and surrounding induration with redness. Wound care consult in the morning check ESR  5. anxiety  present appears significantly anxious and may have evidence of depression. recommend followup with her PCP for further management when she is medically stable.  DVT Prophylaxis: mechanical compression, patient refuses blood thinners Nutrition: Cardiac diet   Code Status:  full  Disposition: Admitted to observation in telemetry unit.  Author: Berle Mull, MD [facility] Pager: [phone] 09/02/2013,  8:56 PM    If 7PM-7AM, please contact night-coverage [email or web address] Password [email or web address]  **Disclaimer: This note may have been dictated with voice recognition software. Similar sounding words can inadvertently be transcribed and this note may contain transcription errors which may not have been corrected upon publication of note.**

## 2013-09-02 NOTE — ED Notes (Signed)
Attempted report X2 

## 2013-09-03 DIAGNOSIS — E871 Hypo-osmolality and hyponatremia: Secondary | ICD-10-CM

## 2013-09-03 DIAGNOSIS — I959 Hypotension, unspecified: Secondary | ICD-10-CM | POA: Diagnosis present

## 2013-09-03 DIAGNOSIS — R001 Bradycardia, unspecified: Secondary | ICD-10-CM | POA: Diagnosis present

## 2013-09-03 LAB — CBC WITH DIFFERENTIAL/PLATELET
Basophils Absolute: 0.1 10*3/uL (ref 0.0–0.1)
Basophils Relative: 1 % (ref 0–1)
EOS PCT: 16 % — AB (ref 0–5)
Eosinophils Absolute: 1.7 10*3/uL — ABNORMAL HIGH (ref 0.0–0.7)
HCT: 27.3 % — ABNORMAL LOW (ref 36.0–46.0)
Hemoglobin: 8.7 g/dL — ABNORMAL LOW (ref 12.0–15.0)
Lymphocytes Relative: 30 % (ref 12–46)
Lymphs Abs: 3 10*3/uL (ref 0.7–4.0)
MCH: 26.4 pg (ref 26.0–34.0)
MCHC: 31.9 g/dL (ref 30.0–36.0)
MCV: 83 fL (ref 78.0–100.0)
Monocytes Absolute: 2 10*3/uL — ABNORMAL HIGH (ref 0.1–1.0)
Monocytes Relative: 19 % — ABNORMAL HIGH (ref 3–12)
Neutro Abs: 3.5 10*3/uL (ref 1.7–7.7)
Neutrophils Relative %: 34 % — ABNORMAL LOW (ref 43–77)
Platelets: 417 10*3/uL — ABNORMAL HIGH (ref 150–400)
RBC: 3.29 MIL/uL — ABNORMAL LOW (ref 3.87–5.11)
RDW: 16.4 % — AB (ref 11.5–15.5)
WBC: 10.3 10*3/uL (ref 4.0–10.5)

## 2013-09-03 LAB — GLUCOSE, CAPILLARY
Glucose-Capillary: 102 mg/dL — ABNORMAL HIGH (ref 70–99)
Glucose-Capillary: 111 mg/dL — ABNORMAL HIGH (ref 70–99)
Glucose-Capillary: 174 mg/dL — ABNORMAL HIGH (ref 70–99)
Glucose-Capillary: 205 mg/dL — ABNORMAL HIGH (ref 70–99)
Glucose-Capillary: 177 mg/dL — ABNORMAL HIGH (ref 70–99)

## 2013-09-03 LAB — COMPREHENSIVE METABOLIC PANEL
ALT: 15 U/L (ref 0–35)
AST: 24 U/L (ref 0–37)
Albumin: 3.2 g/dL — ABNORMAL LOW (ref 3.5–5.2)
Alkaline Phosphatase: 81 U/L (ref 39–117)
Anion gap: 13 (ref 5–15)
BUN: 42 mg/dL — ABNORMAL HIGH (ref 6–23)
CALCIUM: 9.8 mg/dL (ref 8.4–10.5)
CO2: 28 mEq/L (ref 19–32)
Chloride: 91 mEq/L — ABNORMAL LOW (ref 96–112)
Creatinine, Ser: 1.45 mg/dL — ABNORMAL HIGH (ref 0.50–1.10)
GFR calc Af Amer: 44 mL/min — ABNORMAL LOW (ref >90)
GFR calc non Af Amer: 38 mL/min — ABNORMAL LOW (ref >90)
Glucose, Bld: 96 mg/dL (ref 70–99)
Potassium: 4.6 mEq/L (ref 3.7–5.3)
Sodium: 132 mEq/L — ABNORMAL LOW (ref 137–147)
Total Bilirubin: 0.3 mg/dL (ref 0.3–1.2)
Total Protein: 6.9 g/dL (ref 6.0–8.3)

## 2013-09-03 LAB — PROTIME-INR
INR: 1.11 (ref 0.00–1.49)
Prothrombin Time: 14.3 seconds (ref 11.6–15.2)

## 2013-09-03 LAB — MRSA PCR SCREENING: MRSA BY PCR: NEGATIVE

## 2013-09-03 LAB — TROPONIN I

## 2013-09-03 MED ORDER — ADULT MULTIVITAMIN W/MINERALS CH
1.0000 | ORAL_TABLET | Freq: Every day | ORAL | Status: DC
  Administered 2013-09-03 – 2013-09-08 (×5): 1 via ORAL
  Filled 2013-09-03 (×6): qty 1

## 2013-09-03 MED ORDER — CETYLPYRIDINIUM CHLORIDE 0.05 % MT LIQD
7.0000 mL | Freq: Two times a day (BID) | OROMUCOSAL | Status: DC
  Administered 2013-09-03 – 2013-09-08 (×10): 7 mL via OROMUCOSAL

## 2013-09-03 MED ORDER — PRO-STAT SUGAR FREE PO LIQD
30.0000 mL | Freq: Two times a day (BID) | ORAL | Status: DC
  Administered 2013-09-03: 30 mL via ORAL
  Administered 2013-09-04: 09:00:00 via ORAL
  Administered 2013-09-04 – 2013-09-08 (×3): 30 mL via ORAL
  Filled 2013-09-03 (×13): qty 30

## 2013-09-03 NOTE — Progress Notes (Signed)
MD paged regarding notation of junctional rhythm with rates continuing in 30s-40s. Pt continues to deny CP/SOB. Junctional rhythm noted on June 2015 EKG. Will continue to monitor.   Latrelle Dodrill

## 2013-09-03 NOTE — Progress Notes (Signed)
07:50 Sinus arrhythmia and pauses noted. Pt HR fluctuating between 30s (nonsustained) and 70s. BP stable. Pt denied CP and SOB. Printouts made available in shadow chart. MD was made aware and medications updated by MD. Will continue to monitor.   Latrelle Dodrill

## 2013-09-03 NOTE — Progress Notes (Signed)
UR completed 

## 2013-09-03 NOTE — Progress Notes (Signed)
BP soft this AM and SR to SB w/ pauses 40s sometimes 30s, MD aware. BP now 90s/20s with one BP 86/22. Pt denies CP/SOB but states she has been tired during hospital admission. Pt drowsy, HR increased some when awakened NP aware. Order received to hold all BP meds today and for physician to be called prior to giving tonight's dose of lasix. BP increased to 96/22. Will continue to monitor.   [name]

## 2013-09-03 NOTE — Plan of Care (Signed)
Problem: Discharge Progression Outcomes Goal: Tolerates diet Outcome: Progressing Pt tolerates cardiac diet but has difficulty following fluid restrictions while in hospital.

## 2013-09-03 NOTE — Progress Notes (Addendum)
Dr. Thereasa Solo updated on pt's BP trend fluctuation with DBP 20s and fluctuating systolic pressure 998,33,ASN . Aware that pt denies CP/SOB/Dizziness and has been up in chair tolerating well. Medications adjusted and Ntg patch that was placed last night was removed per MD instruction.   [name]

## 2013-09-03 NOTE — Telephone Encounter (Signed)
Per [name] take Lasix 80 mg twice a day, until other wise notified.

## 2013-09-03 NOTE — Progress Notes (Addendum)
[name]  [name] [id number] DOB: Oct 07, 1950 DOA: 09/02/2013 PCP: [name], MD   Brief narrative: 63 year old female patient with multiple medical problems including non-alcoholic cirrhosis, known squamous cell lung cancer, chronic diastolic dysfunction, chronic respiratory failure due to COPD oxygen dependent. She presented to the hospital because of chest pain and shortness of breath. Based on her home scale she had gained 5 pounds. After notifying her physician Lasix dosage was increased for one week. Unfortunately after completing this dosage regimen her symptoms persisted. Since that time she has noted progressive shortness of breath, increasing lower extremity edema, chest pressure and dyspnea on exertion. Upon her home health nurse evaluation on the day of presentation her blood pressure was low at 100/50.  After presentation to the ER she was stable on her usual 2 L nasal cannula oxygen, and she was normotensive with a BP of 135/45. Her chest x-ray revealed stable cardiomegaly with vascular congestion. She also had a new finding of hyponatremia. And she appeared to have mild acute kidney injury noting her baseline renal function is 32/0.97 at presentation was 47/1.53. Initial EKG and cardiac enzymes were unremarkable.  HPI/Subjective: Awake complaining of generalized fatigue. Denies shortness of breath or chest pain at rest.  Assessment/Plan:     Isorhythmic AV dissociation -Noted after admission and suspect this is the cause the patient's current chest pain and respiratory symptoms -Was evaluated in June for same rhythm disturbance-cardiology determined patient's trazodone was the cause and with discontinuation of this medication the arrhythmia resolved -Unfortunately despite this medication being listed as discontinued on the discharge summary as well as the AVS the patient continued this medication after discharge -DC  Trazadone -Today heart rate dips into the 30s especially when asleep so we'll monitor closely but suspect as trazodone washes out of system arrhythmia will resolve spontaneously without need for pacemaker -If develops significant hemodynamic compromise related to rhythm consult cardiology    Acute kidney injury -Likely from recent increase in diuretic dosage in setting of chronic use of ARB and poor perfusion from above stated arrhythmia -Hold ARB    Chest pain  -Suspect secondary to above stated arrhythmia -Follow    Hypotension -Partially related to arrhythmia but possibly related to overdiuresis -We'll cautiously hold antihypertensive medications    Type II  diabetes mellitus -Current CBGs controlled -Continue SSI -due to renal dysfunction home Lantus on hold but will monitor closely    Chronic respiratory failure/ 02 dependent COPD /Chronic diastolic heart failure -Compensated from a respiratory standpoint but still could be volume overloaded secondary to third spacing as evidenced by continued peripheral edema    Left patella fracture -Wound care RN consulted    Hyponatremia -follow      HYPERTENSION -BP soft home meds on hold    GERD -Continue PPI    Cirrhosis of liver not due to alcohol -Continue Rifaxamin -Check ammonia level  DVT prophylaxis: SCDs Code Status: Full Family Communication: No family at bedside Disposition Plan/Expected LOS: Step down do to symptomatic transient but recurring bradycardia  Consultants: None  Procedures: None  Cultures: None  Antibiotics: None  Objective: Blood pressure 146/55, pulse 69, temperature 98.6 F (37 C), temperature source Oral, resp. rate 13, height 5' (1.524 m), weight 186 lb 4.6 oz (84.5 kg), SpO2 96.00%.  Intake/Output Summary (Last 24 hours) at 09/03/13 1344 Last data filed at 09/03/13 1300  Gross per 24 hour  Intake    420 ml  Output   1950 ml  Net  -1530 ml   Exam: Gen: No acute respiratory  distress Chest: Clear to auscultation bilaterally without wheezes, rhonchi or crackles, room air Cardiac: Mostly regular rate and rhythm but does have episodes of bradycardia with rates in the 30s consistent with isorhythmic A-V dissociation noted during June admission, S1-S2, no rubs murmurs or gallops, significant lower extremity peripheral edema 2-3+, no JVD Abdomen: Soft nontender nondistended without obvious hepatosplenomegaly, no ascites Extremities: Symmetrical in appearance without cyanosis, clubbing or effusion  Scheduled Meds:  Scheduled Meds: . antiseptic oral rinse  7 mL Mouth Rinse BID  . dicyclomine  10 mg Oral TID  . folic acid  1 mg Oral QPM  . furosemide  40 mg Oral BID  . hydrALAZINE  37.5 mg Oral BID  . insulin aspart  0-5 Units Subcutaneous QHS  . insulin aspart  0-9 Units Subcutaneous TID WC  . iron polysaccharides  150 mg Oral BID  . isosorbide dinitrate  20 mg Oral BID  . metoCLOPramide  10 mg Oral TID  . mometasone-formoterol  2 puff Inhalation BID  . pantoprazole  40 mg Oral Daily  . rifaximin  550 mg Oral BID   Data Reviewed: Basic Metabolic Panel:  Recent Labs Lab 09/02/13 1803 09/02/13 2208 09/03/13 0605  NA 128* 129* 132*  K 4.4 4.3 4.6  CL 88* 88* 91*  CO2 27 27 28   GLUCOSE 58* 56* 96  BUN 47* 48* 42*  CREATININE 1.53* 1.50* 1.45*  CALCIUM 10.1 10.2 9.8   Liver Function Tests:  Recent Labs Lab 09/02/13 2208 09/03/13 0605  AST 26 24  ALT 16 15  ALKPHOS 87 81  BILITOT 0.2* 0.3  PROT 7.5 6.9  ALBUMIN 3.5 3.2*   No results found for this basename: LIPASE, AMYLASE,  in the last 168 hours No results found for this basename: AMMONIA,  in the last 168 hours CBC:  Recent Labs Lab 09/02/13 1803 09/03/13 0605  WBC 11.6* 10.3  NEUTROABS 6.3 3.5  HGB 9.0* 8.7*  HCT 28.1* 27.3*  MCV 82.4 83.0  PLT 427* 417*   Cardiac Enzymes:  Recent Labs Lab 09/02/13 1803 09/03/13 0045  TROPONINI <0.30 <0.30   BNP (last 3 results)  Recent  Labs  07/12/13 2232 07/16/13 0650 09/02/13 1803  PROBNP 4136.0* 933.6* 558.0*   CBG:  Recent Labs Lab 09/02/13 2252 09/02/13 2320 09/03/13 0347 09/03/13 0836 09/03/13 1252  GLUCAP 61* 76 102* 111* 174*    Recent Results (from the past 240 hour(s))  MRSA PCR SCREENING     Status: None   Collection Time    09/02/13 10:33 PM      Result Value Ref Range Status   MRSA by PCR NEGATIVE  NEGATIVE Final   Comment:            The GeneXpert MRSA Assay (FDA     approved for NASAL specimens     only), is one component of a     comprehensive MRSA colonization     surveillance program. It is not     intended to diagnose MRSA     infection nor to guide or     monitor treatment for     MRSA infections.     Studies:  Recent x-ray studies have been reviewed in detail by the Attending Physician  Time spent :  21 mins  Erin Hearing, ANP [facility] Office  [phone] Pager [phone]   **If unable to reach the  above provider after paging please contact the Flow Manager @ [phone]  On-Call/Text Page:      [email or web address]      password [email or web address]  If 7PM-7AM, please contact night-coverage [email or web address] Password [email or web address] 09/03/2013, 1:44 PM   LOS: 1 day   I have personally examined this patient and reviewed the entire database. I have reviewed the above note, made any necessary editorial changes, and agree with its content.  Cherene Altes, MD [facility]

## 2013-09-03 NOTE — Telephone Encounter (Signed)
I spoke with pt and she is in the hospital right now. I updated medication list and left a voice message for [place] with home care.

## 2013-09-03 NOTE — Plan of Care (Signed)
Problem: Phase I Progression Outcomes Goal: Hemodynamically stable Outcome: Progressing Pt is A/O x4 and has stable V/S. However, we are carefully monitoring her for sleep apnea.

## 2013-09-03 NOTE — Progress Notes (Signed)
INITIAL NUTRITION ASSESSMENT  DOCUMENTATION CODES Per approved criteria  -Obesity Unspecified   INTERVENTION: - Prostat BID, each supplement provides 100 calories and 15 g of protein - Pt educated on the importance of incorporating high-protein foods into her diet. Teach-back method used. - Multivitamin with minerals daily  NUTRITION DIAGNOSIS: Increased nutrient needs related to protein as evidenced by wound on patella.   Goal: Pt to meet >/= 90% of their estimated nutrition needs   Monitor:  Weight trends, po intake, acceptance of supplements, labs, wound healing  Reason for Assessment: mst  63 y.o. female  Admitting Dx: <principal problem not specified>  ASSESSMENT: 63 y.o. female with Past medical history of cirrhosis of the liver nor alcohol, GERD, squamous cell lung cancer, irritable bowel syndrome, chronic diastolic dysfunction, COPD mild, chronic respiratory failure on oxygen on exertion at home.  The patient presented with complaints of 5 pound weight gain, chest pressure, shortness of breath and worsening leg swelling.  - Pt unsure of weight trends due to fluctuations in fluid retention. Usual dry body weight is reported as 177 lbs. Pt reports that she is eating well and has not lost her appetite. She ate 100% of her breakfast this morning and was just starting on lunch during RD visit.  - Pt encouraged to incorporate high protein foods into her diet. Educated on which foods were high in protein and importance for wound healing.  - Pt with no signs of significant fat and/or muscle wasting.  Height: Ht Readings from Last 1 Encounters:  09/02/13 5' (1.524 m)    Weight: Wt Readings from Last 1 Encounters:  09/03/13 186 lb 4.6 oz (84.5 kg)    Ideal Body Weight: 45.5 kg  % Ideal Body Weight: 186%  Wt Readings from Last 10 Encounters:  09/03/13 186 lb 4.6 oz (84.5 kg)  08/09/13 188 lb 9.6 oz (85.548 kg)  07/29/13 184 lb (83.462 kg)  07/17/13 186 lb 6.4 oz  (84.55 kg)  06/23/13 199 lb 11.2 oz (90.583 kg)  05/20/13 203 lb 14.8 oz (92.5 kg)  04/28/13 190 lb 0.6 oz (86.2 kg)  04/22/13 180 lb 8.9 oz (81.9 kg)  04/22/13 180 lb 8.9 oz (81.9 kg)  04/13/13 180 lb 11.2 oz (81.965 kg)    Usual Body Weight: 177 lb  % Usual Body Weight: 105%  BMI:  Body mass index is 36.38 kg/(m^2).  Estimated Nutritional Needs: Kcal: 1400-1600 Protein: 105-120 g Fluid: 1.6 L/day  Skin: open wound on left knee  Diet Order: Cardiac  EDUCATION NEEDS: -Education needs addressed   Intake/Output Summary (Last 24 hours) at 09/03/13 1417 Last data filed at 09/03/13 1300  Gross per 24 hour  Intake    420 ml  Output   1950 ml  Net  -1530 ml    Last BM: Pt with colostomy, bm today 8/7   Labs:   Recent Labs Lab 09/02/13 1803 09/02/13 2208 09/03/13 0605  NA 128* 129* 132*  K 4.4 4.3 4.6  CL 88* 88* 91*  CO2 27 27 28   BUN 47* 48* 42*  CREATININE 1.53* 1.50* 1.45*  CALCIUM 10.1 10.2 9.8  GLUCOSE 58* 56* 96    CBG (last 3)   Recent Labs  09/03/13 0347 09/03/13 0836 09/03/13 1252  GLUCAP 102* 111* 174*    Scheduled Meds: . antiseptic oral rinse  7 mL Mouth Rinse BID  . dicyclomine  10 mg Oral TID  . folic acid  1 mg Oral QPM  . furosemide  40  mg Oral BID  . insulin aspart  0-5 Units Subcutaneous QHS  . insulin aspart  0-9 Units Subcutaneous TID WC  . iron polysaccharides  150 mg Oral BID  . metoCLOPramide  10 mg Oral TID  . mometasone-formoterol  2 puff Inhalation BID  . pantoprazole  40 mg Oral Daily  . rifaximin  550 mg Oral BID    Continuous Infusions:   Past Medical History  Diagnosis Date  . Cirrhosis   . GERD (gastroesophageal reflux disease)   . Cervical disc syndrome   . Lung cancer     squamous cell  . Chronic respiratory failure   . Diverticulitis of colon   . Perforation of colon   . Arthritis   . IBS (irritable bowel syndrome)   . Chronic lower GI bleeding   . Overactive bladder   . Cervical cancer   .  Thrombocytopenia     sees Dr. Julien Nordmann   . Splenomegaly   . Depression   . Hypertension   . Asthma   . Chronic headache disorder   . Blood transfusion without reported diagnosis   . Heart murmur   . Hyperlipidemia   . Lung cancer   . COPD (chronic obstructive pulmonary disease)     sees Dr. Gwenette Greet   . Anemia     sees Dr. Julien Nordmann, due to chronic disease and GI losses   . Diabetes mellitus     sees Dr. Cruzita Lederer   . Colostomy care   . Hypercalcemia     Past Surgical History  Procedure Laterality Date  . Appendectomy    . Cholecystectomy    . Colostomy    . Pneumonectomy    . Cervix removed    . Bowel resection    . Esophagogastroduodenoscopy  03/19/2011    Procedure: ESOPHAGOGASTRODUODENOSCOPY (EGD);  Surgeon: [name], MD;  Location: [facility];  Service: Endoscopy;  Laterality: N/A;  . Colonoscopy  03/19/2011    Procedure: COLONOSCOPY;  Surgeon: [name], MD;  Location: [facility];  Service: Endoscopy;  Laterality: N/A;  . Givens capsule study  03/20/2011    Procedure: GIVENS CAPSULE STUDY;  Surgeon: [name], MD;  Location: [facility];  Service: Endoscopy;  Laterality: N/A;  . Small bowel obstruction repair  March 2012  . Umbilical hernia repair  March 2012  . Splenectomy, total N/A 02/24/2013    Procedure: SPLENECTOMY;  Surgeon: Harl Bowie, MD;  Location: [place];  Service: General;  Laterality: N/A;  . Orif patella fracture Left 04/21/2013    DR [name]  . Orif patella Left 04/21/2013    Procedure: OPEN REDUCTION INTERNAL (ORIF) FIXATION PATELLA;  Surgeon: Augustin Schooling, MD;  Location: [place];  Service: Orthopedics;  Laterality: Left;    Terrace Arabia RD, LDN

## 2013-09-04 DIAGNOSIS — I2510 Atherosclerotic heart disease of native coronary artery without angina pectoris: Secondary | ICD-10-CM | POA: Diagnosis present

## 2013-09-04 DIAGNOSIS — J449 Chronic obstructive pulmonary disease, unspecified: Secondary | ICD-10-CM | POA: Diagnosis present

## 2013-09-04 DIAGNOSIS — Z87891 Personal history of nicotine dependence: Secondary | ICD-10-CM | POA: Diagnosis not present

## 2013-09-04 DIAGNOSIS — I509 Heart failure, unspecified: Secondary | ICD-10-CM | POA: Diagnosis present

## 2013-09-04 DIAGNOSIS — I495 Sick sinus syndrome: Secondary | ICD-10-CM | POA: Diagnosis present

## 2013-09-04 DIAGNOSIS — S82009A Unspecified fracture of unspecified patella, initial encounter for closed fracture: Secondary | ICD-10-CM | POA: Diagnosis present

## 2013-09-04 DIAGNOSIS — I4589 Other specified conduction disorders: Secondary | ICD-10-CM | POA: Diagnosis present

## 2013-09-04 DIAGNOSIS — C3490 Malignant neoplasm of unspecified part of unspecified bronchus or lung: Secondary | ICD-10-CM | POA: Diagnosis present

## 2013-09-04 DIAGNOSIS — Z933 Colostomy status: Secondary | ICD-10-CM | POA: Diagnosis not present

## 2013-09-04 DIAGNOSIS — F3289 Other specified depressive episodes: Secondary | ICD-10-CM | POA: Diagnosis present

## 2013-09-04 DIAGNOSIS — I10 Essential (primary) hypertension: Secondary | ICD-10-CM | POA: Diagnosis present

## 2013-09-04 DIAGNOSIS — Z794 Long term (current) use of insulin: Secondary | ICD-10-CM | POA: Diagnosis not present

## 2013-09-04 DIAGNOSIS — K219 Gastro-esophageal reflux disease without esophagitis: Secondary | ICD-10-CM | POA: Diagnosis present

## 2013-09-04 DIAGNOSIS — Z8541 Personal history of malignant neoplasm of cervix uteri: Secondary | ICD-10-CM | POA: Diagnosis not present

## 2013-09-04 DIAGNOSIS — N179 Acute kidney failure, unspecified: Secondary | ICD-10-CM | POA: Diagnosis present

## 2013-09-04 DIAGNOSIS — E785 Hyperlipidemia, unspecified: Secondary | ICD-10-CM | POA: Diagnosis present

## 2013-09-04 DIAGNOSIS — I959 Hypotension, unspecified: Secondary | ICD-10-CM | POA: Diagnosis present

## 2013-09-04 DIAGNOSIS — Z79899 Other long term (current) drug therapy: Secondary | ICD-10-CM | POA: Diagnosis not present

## 2013-09-04 DIAGNOSIS — G8929 Other chronic pain: Secondary | ICD-10-CM | POA: Diagnosis present

## 2013-09-04 DIAGNOSIS — E871 Hypo-osmolality and hyponatremia: Secondary | ICD-10-CM | POA: Diagnosis present

## 2013-09-04 DIAGNOSIS — K7460 Unspecified cirrhosis of liver: Secondary | ICD-10-CM | POA: Diagnosis present

## 2013-09-04 DIAGNOSIS — M549 Dorsalgia, unspecified: Secondary | ICD-10-CM | POA: Diagnosis present

## 2013-09-04 DIAGNOSIS — Z9981 Dependence on supplemental oxygen: Secondary | ICD-10-CM | POA: Diagnosis not present

## 2013-09-04 DIAGNOSIS — F411 Generalized anxiety disorder: Secondary | ICD-10-CM | POA: Diagnosis present

## 2013-09-04 DIAGNOSIS — F329 Major depressive disorder, single episode, unspecified: Secondary | ICD-10-CM | POA: Diagnosis present

## 2013-09-04 DIAGNOSIS — I5032 Chronic diastolic (congestive) heart failure: Secondary | ICD-10-CM | POA: Diagnosis present

## 2013-09-04 DIAGNOSIS — J9610 Chronic respiratory failure, unspecified whether with hypoxia or hypercapnia: Secondary | ICD-10-CM | POA: Diagnosis present

## 2013-09-04 DIAGNOSIS — T502X5A Adverse effect of carbonic-anhydrase inhibitors, benzothiadiazides and other diuretics, initial encounter: Secondary | ICD-10-CM | POA: Diagnosis present

## 2013-09-04 LAB — CBC
HCT: 27.4 % — ABNORMAL LOW (ref 36.0–46.0)
Hemoglobin: 8.7 g/dL — ABNORMAL LOW (ref 12.0–15.0)
MCH: 26.2 pg (ref 26.0–34.0)
MCHC: 31.8 g/dL (ref 30.0–36.0)
MCV: 82.5 fL (ref 78.0–100.0)
PLATELETS: 439 10*3/uL — AB (ref 150–400)
RBC: 3.32 MIL/uL — ABNORMAL LOW (ref 3.87–5.11)
RDW: 16.6 % — ABNORMAL HIGH (ref 11.5–15.5)
WBC: 10.8 10*3/uL — AB (ref 4.0–10.5)

## 2013-09-04 LAB — COMPREHENSIVE METABOLIC PANEL
ALBUMIN: 3 g/dL — AB (ref 3.5–5.2)
ALK PHOS: 79 U/L (ref 39–117)
ALT: 16 U/L (ref 0–35)
ANION GAP: 12 (ref 5–15)
AST: 26 U/L (ref 0–37)
BUN: 48 mg/dL — AB (ref 6–23)
CO2: 26 mEq/L (ref 19–32)
Calcium: 9.5 mg/dL (ref 8.4–10.5)
Chloride: 90 mEq/L — ABNORMAL LOW (ref 96–112)
Creatinine, Ser: 1.51 mg/dL — ABNORMAL HIGH (ref 0.50–1.10)
GFR calc Af Amer: 42 mL/min — ABNORMAL LOW (ref >90)
GFR calc non Af Amer: 36 mL/min — ABNORMAL LOW (ref >90)
Glucose, Bld: 141 mg/dL — ABNORMAL HIGH (ref 70–99)
POTASSIUM: 5.2 meq/L (ref 3.7–5.3)
Sodium: 128 mEq/L — ABNORMAL LOW (ref 137–147)
TOTAL PROTEIN: 6.6 g/dL (ref 6.0–8.3)
Total Bilirubin: <0.2 mg/dL — ABNORMAL LOW (ref 0.3–1.2)

## 2013-09-04 LAB — GLUCOSE, CAPILLARY
GLUCOSE-CAPILLARY: 203 mg/dL — AB (ref 70–99)
Glucose-Capillary: 131 mg/dL — ABNORMAL HIGH (ref 70–99)
Glucose-Capillary: 248 mg/dL — ABNORMAL HIGH (ref 70–99)
Glucose-Capillary: 176 mg/dL — ABNORMAL HIGH (ref 70–99)

## 2013-09-04 LAB — TROPONIN I

## 2013-09-04 LAB — AMMONIA: Ammonia: 69 umol/L — ABNORMAL HIGH (ref 11–60)

## 2013-09-04 MED ORDER — ALBUTEROL SULFATE (2.5 MG/3ML) 0.083% IN NEBU
2.5000 mg | INHALATION_SOLUTION | Freq: Two times a day (BID) | RESPIRATORY_TRACT | Status: DC
  Administered 2013-09-05 – 2013-09-06 (×3): 2.5 mg via RESPIRATORY_TRACT
  Filled 2013-09-04 (×3): qty 3

## 2013-09-04 MED ORDER — OXYCODONE HCL 5 MG PO TABS
10.0000 mg | ORAL_TABLET | ORAL | Status: DC | PRN
  Administered 2013-09-04 – 2013-09-05 (×6): 10 mg via ORAL
  Filled 2013-09-04 (×5): qty 2

## 2013-09-04 MED ORDER — SODIUM CHLORIDE 0.9 % IV SOLN
INTRAVENOUS | Status: DC
  Administered 2013-09-04 – 2013-09-06 (×2): via INTRAVENOUS

## 2013-09-04 MED ORDER — ALBUTEROL SULFATE (2.5 MG/3ML) 0.083% IN NEBU
2.5000 mg | INHALATION_SOLUTION | Freq: Four times a day (QID) | RESPIRATORY_TRACT | Status: DC
  Administered 2013-09-04 (×3): 2.5 mg via RESPIRATORY_TRACT
  Filled 2013-09-04 (×3): qty 3

## 2013-09-04 MED ORDER — ALBUTEROL SULFATE (2.5 MG/3ML) 0.083% IN NEBU
2.5000 mg | INHALATION_SOLUTION | RESPIRATORY_TRACT | Status: DC | PRN
  Filled 2013-09-04: qty 3

## 2013-09-04 MED ORDER — LACTULOSE 10 GM/15ML PO SOLN
10.0000 g | Freq: Two times a day (BID) | ORAL | Status: DC
  Filled 2013-09-04 (×2): qty 15

## 2013-09-04 NOTE — Progress Notes (Signed)
UR Completed.  [name] Jane [phone] 09/04/2013  

## 2013-09-04 NOTE — Progress Notes (Signed)
NP with [place]. Notified of low BP 92/22 and HR upper 30's to low 40's. Patient is requesting pain medication for chronic pain issues to left leg. Half dose of regular pain med ordered this time only. Instead of oxycodone 20 mg, will give oxycodone 10 mg.

## 2013-09-04 NOTE — Progress Notes (Signed)
[name]  [name] PFX:[id number] DOB: [birth year] DOA: 09/02/2013 PCP: [name], MD   Brief narrative: 63 year old female patient with multiple medical problems including non-alcoholic cirrhosis, known squamous cell lung cancer, chronic diastolic dysfunction, and chronic respiratory failure due to oxygen dependent COPD. She presented to the hospital because of chest pain and shortness of breath. Based on her home scale she had gained 5 pounds. After notifying her physician Lasix dosage was increased for one week. Unfortunately after completing this dosage regimen her symptoms persisted. Since that time she has noted progressive shortness of breath, increasing lower extremity edema, chest pressure and dyspnea on exertion. Upon her home health nurse evaluation on the day of presentation her blood pressure was low at 100/50.  After presentation to the ER she was stable on her usual 2 L nasal cannula oxygen, and she was normotensive with a BP of 135/45. Her chest x-ray revealed stable cardiomegaly with vascular congestion. She also had a new finding of hyponatremia. She appeared to have mild acute kidney injury noting her baseline renal function is 32/0.97 at presentation was 47/1.53. Initial EKG and cardiac enzymes were unremarkable.  HPI/Subjective: Pt reports ongoing chronic back pain which is poorly controlled.  She does relate intermittent central pressure like chest pain, but states that she is currently chest pain free.    Assessment/Plan:  Isorhythmic AV dissociation -suspect this is the cause of her presenting SOB/lethargy -evaluated in June for same - Cardiology determined patient's trazodone was the cause and with discontinuation of this medication the arrhythmia reportedly resolved -unfortunately the patient continued this medication after discharge -DC Trazadone - last dose given was 200mg  on 8/6 night  -if rate not improved after  48-60 hrs (half life is ~8hrs) will consult Cardiology / EP  Acute kidney injury -baseline crt ~0.9 -Likely from recent increase in diuretic dosage in setting of chronic use of ARB and poor perfusion from above stated arrhythmia -Hold ARB - gently hydrate and follow   Chest pain  -troponin negative x2 - no acute EKG changes - repeat EKG this AM - recheck troponin   Hypotension -cont cautious hydration   Type II  diabetes mellitus -CBG reasonably controlled - no change in tx today   Chronic respiratory failure/ 02 dependent COPD / Chronic diastolic heart failure -Compensated from a respiratory standpoint   Left patella fracture -Wound care RN consulted  Hyponatremia -follow - due to liver disease     Hx of HYPERTENSION -BP soft - home meds on hold  GERD -Continue PPI  Cirrhosis of liver not due to alcohol -ammonia elevated - cont rifaximin - add low does lactulose short term if mental status becomes affected   DVT prophylaxis: SCDs Code Status: Full Family Communication: No family at bedside Disposition Plan/Expected LOS: SDU  Consultants: None  Procedures: None  Cultures: None  Antibiotics: None  Objective: Blood pressure 132/41, pulse 49, temperature 97.6 F (36.4 C), temperature source Oral, resp. rate 9, height 5' (1.524 m), weight 84.5 kg (186 lb 4.6 oz), SpO2 99.00%.  Intake/Output Summary (Last 24 hours) at 09/04/13 1052 Last data filed at 09/04/13 0800  Gross per 24 hour  Intake    850 ml  Output   2150 ml  Net  -1300 ml   Exam: Gen: No acute respiratory distress Chest: Clear to auscultation bilaterally without wheezes, rhonchi or crackles Cardiac: Mostly regular rate and rhythm but does have episodes of bradycardia with rates  in the 30s - no rubs murmurs or gallops Abdomen: Soft nontender nondistended without obvious hepatosplenomegaly, no ascites Extremities: Symmetrical in appearance without cyanosis, or clubbing - 2+ pedal edema    Scheduled Meds:  Scheduled Meds: . albuterol  2.5 mg Nebulization QID  . antiseptic oral rinse  7 mL Mouth Rinse BID  . dicyclomine  10 mg Oral TID  . feeding supplement (PRO-STAT SUGAR FREE 64)  30 mL Oral BID WC  . folic acid  1 mg Oral QPM  . insulin aspart  0-5 Units Subcutaneous QHS  . insulin aspart  0-9 Units Subcutaneous TID WC  . iron polysaccharides  150 mg Oral BID  . metoCLOPramide  10 mg Oral TID  . mometasone-formoterol  2 puff Inhalation BID  . multivitamin with minerals  1 tablet Oral Daily  . pantoprazole  40 mg Oral Daily  . rifaximin  550 mg Oral BID   Data Reviewed: Basic Metabolic Panel:  Recent Labs Lab 09/02/13 1803 09/02/13 2208 09/03/13 0605 09/04/13 0255  NA 128* 129* 132* 128*  K 4.4 4.3 4.6 5.2  CL 88* 88* 91* 90*  CO2 27 27 28 26   GLUCOSE 58* 56* 96 141*  BUN 47* 48* 42* 48*  CREATININE 1.53* 1.50* 1.45* 1.51*  CALCIUM 10.1 10.2 9.8 9.5   Liver Function Tests:  Recent Labs Lab 09/02/13 2208 09/03/13 0605 09/04/13 0255  AST 26 24 26   ALT 16 15 16   ALKPHOS 87 81 79  BILITOT 0.2* 0.3 <0.2*  PROT 7.5 6.9 6.6  ALBUMIN 3.5 3.2* 3.0*   No results found for this basename: LIPASE, AMYLASE,  in the last 168 hours  Recent Labs Lab 09/04/13 0255  AMMONIA 69*   CBC:  Recent Labs Lab 09/02/13 1803 09/03/13 0605 09/04/13 0255  WBC 11.6* 10.3 10.8*  NEUTROABS 6.3 3.5  --   HGB 9.0* 8.7* 8.7*  HCT 28.1* 27.3* 27.4*  MCV 82.4 83.0 82.5  PLT 427* 417* 439*   Cardiac Enzymes:  Recent Labs Lab 09/02/13 1803 09/03/13 0045  TROPONINI <0.30 <0.30   BNP (last 3 results)  Recent Labs  07/12/13 2232 07/16/13 0650 09/02/13 1803  PROBNP 4136.0* 933.6* 558.0*   CBG:  Recent Labs Lab 09/03/13 0836 09/03/13 1252 09/03/13 1655 09/03/13 2147 09/04/13 0755  GLUCAP 111* 174* 205* 177* 131*    Recent Results (from the past 240 hour(s))  MRSA PCR SCREENING     Status: None   Collection Time    09/02/13 10:33 PM       Result Value Ref Range Status   MRSA by PCR NEGATIVE  NEGATIVE Final   Comment:            The GeneXpert MRSA Assay (FDA     approved for NASAL specimens     only), is one component of a     comprehensive MRSA colonization     surveillance program. It is not     intended to diagnose MRSA     infection nor to guide or     monitor treatment for     MRSA infections.     Studies:  Recent x-ray studies have been reviewed in detail by the Attending Physician  Time spent :  11 mins  Cherene Altes, MD [facility] For Consults/Admissions - Flow Manager - [phone] Office  [phone] Pager [phone]  On-Call/Text Page:      [email or web address]      password [facility]  09/04/2013, 10:52 AM   LOS:  2 days

## 2013-09-05 LAB — COMPREHENSIVE METABOLIC PANEL
ALT: 16 U/L (ref 0–35)
AST: 23 U/L (ref 0–37)
Albumin: 3.3 g/dL — ABNORMAL LOW (ref 3.5–5.2)
Alkaline Phosphatase: 86 U/L (ref 39–117)
Anion gap: 13 (ref 5–15)
BUN: 52 mg/dL — ABNORMAL HIGH (ref 6–23)
CALCIUM: 9.9 mg/dL (ref 8.4–10.5)
CHLORIDE: 86 meq/L — AB (ref 96–112)
CO2: 25 mEq/L (ref 19–32)
Creatinine, Ser: 1.64 mg/dL — ABNORMAL HIGH (ref 0.50–1.10)
GFR calc Af Amer: 38 mL/min — ABNORMAL LOW (ref >90)
GFR, EST NON AFRICAN AMERICAN: 33 mL/min — AB (ref >90)
Glucose, Bld: 153 mg/dL — ABNORMAL HIGH (ref 70–99)
Potassium: 5.1 mEq/L (ref 3.7–5.3)
SODIUM: 124 meq/L — AB (ref 137–147)
Total Protein: 7.1 g/dL (ref 6.0–8.3)

## 2013-09-05 LAB — CBC
HEMATOCRIT: 27.9 % — AB (ref 36.0–46.0)
HEMOGLOBIN: 8.8 g/dL — AB (ref 12.0–15.0)
MCH: 25.8 pg — ABNORMAL LOW (ref 26.0–34.0)
MCHC: 31.5 g/dL (ref 30.0–36.0)
MCV: 81.8 fL (ref 78.0–100.0)
Platelets: 453 10*3/uL — ABNORMAL HIGH (ref 150–400)
RBC: 3.41 MIL/uL — AB (ref 3.87–5.11)
RDW: 16.5 % — ABNORMAL HIGH (ref 11.5–15.5)
WBC: 13.7 10*3/uL — AB (ref 4.0–10.5)

## 2013-09-05 LAB — GLUCOSE, CAPILLARY
Glucose-Capillary: 170 mg/dL — ABNORMAL HIGH (ref 70–99)
Glucose-Capillary: 168 mg/dL — ABNORMAL HIGH (ref 70–99)
Glucose-Capillary: 193 mg/dL — ABNORMAL HIGH (ref 70–99)
Glucose-Capillary: 180 mg/dL — ABNORMAL HIGH (ref 70–99)

## 2013-09-05 MED ORDER — OXYCODONE HCL 5 MG PO TABS
15.0000 mg | ORAL_TABLET | ORAL | Status: DC | PRN
  Administered 2013-09-05 – 2013-09-08 (×19): 15 mg via ORAL
  Filled 2013-09-05 (×19): qty 3

## 2013-09-05 MED ORDER — INSULIN GLARGINE 100 UNIT/ML ~~LOC~~ SOLN
10.0000 [IU] | Freq: Two times a day (BID) | SUBCUTANEOUS | Status: DC
  Administered 2013-09-05 – 2013-09-08 (×6): 10 [IU] via SUBCUTANEOUS
  Filled 2013-09-05 (×8): qty 0.1

## 2013-09-05 MED ORDER — ONDANSETRON HCL 4 MG PO TABS: 4.0000 mg | ORAL_TABLET | Freq: Three times a day (TID) | ORAL | Status: DC | PRN

## 2013-09-05 NOTE — Progress Notes (Signed)
[name]  [name] [id number]:[id number] DOB: 1950-05-18 DOA: 09/02/2013 PCP: [name], MD   Brief narrative: 63 year old female patient with multiple medical problems including non-alcoholic cirrhosis, known squamous cell lung cancer, chronic diastolic dysfunction, and chronic respiratory failure due to oxygen dependent COPD. She presented to the hospital because of chest pain and shortness of breath. Based on her home scale she had gained 5 pounds. After notifying her physician Lasix dosage was increased for one week. Unfortunately after completing this dosage regimen her symptoms persisted. Since that time she has noted progressive shortness of breath, increasing lower extremity edema, chest pressure and dyspnea on exertion. Upon her home health nurse evaluation on the day of presentation her blood pressure was low at 100/50.  After presentation to the ER she was stable on her usual 2 L nasal cannula oxygen, and she was normotensive with a BP of 135/45. Her chest x-ray revealed stable cardiomegaly with vascular congestion. She also had a new finding of hyponatremia. She appeared to have mild acute kidney injury noting her baseline renal function is 32/0.97 at presentation was 47/1.53. Initial EKG and cardiac enzymes were unremarkable.  HPI/Subjective: No new complaints today.  States she did not sleep well last night, and had some self limited nausea this morning.  Assessment/Plan:  Isorhythmic AV dissociation -suspect this is the cause of her presenting SOB/lethargy -evaluated in June for same - Cardiology determined patient's trazodone was the cause and with discontinuation of this medication the arrhythmia reportedly resolved -unfortunately the patient mistakenly continued this medication after discharge -stopped Trazadone - last dose given was 200mg  on 8/6 night  -if rate not improved by 8/10 AM will consult Cardiology / EP  Acute kidney  injury -baseline crt ~0.9 -Likely from recent increase in diuretic dosage in setting of chronic use of ARB and poor perfusion from above stated arrhythmia -Holding ARB - gently hydrate and follow   Chest pain  -troponin negative x3 - no acute EKG changes - no more cp reported   Hypotension -steadily improving with cautious hydration   Type II  diabetes mellitus -CBG trending upward - adjust tx and follow   Chronic respiratory failure/ 02 dependent COPD / Chronic diastolic heart failure -Compensated from a respiratory standpoint   Left patella fracture -Wound care RN consulted  Hyponatremia -follow -likely due to liver disease - holding diuretic at this time    Hx of HYPERTENSION -BP improving w/ volume expansion   GERD -Continue PPI  Cirrhosis of liver not due to alcohol -ammonia elevated - cont rifaximin - add low does lactulose short term if mental status becomes affected   DVT prophylaxis: SCDs Code Status: Full Family Communication: No family at bedside Disposition Plan/Expected LOS: SDU  Consultants: None  Procedures: None  Cultures: None  Antibiotics: None  Objective: Blood pressure 127/18, pulse 48, temperature 98.2 F (36.8 C), temperature source Oral, resp. rate 17, height 5' (1.524 m), weight 86.637 kg (191 lb), SpO2 96.00%.  Intake/Output Summary (Last 24 hours) at 09/05/13 1006 Last data filed at 09/05/13 0600  Gross per 24 hour  Intake   1260 ml  Output    400 ml  Net    860 ml   Exam: Gen: No acute respiratory distress Chest: Clear to auscultation bilaterally without wheezes, rhonchi or crackles Cardiac: bradycardic but regular - no appreciable M or rub  Abdomen: Soft nontender nondistended without obvious hepatosplenomegaly, no ascites Extremities: Symmetrical in appearance without  cyanosis, or clubbing - 1+ pedal edema   Scheduled Meds:  Scheduled Meds: . albuterol  2.5 mg Nebulization BID  . antiseptic oral rinse  7 mL Mouth Rinse  BID  . dicyclomine  10 mg Oral TID  . feeding supplement (PRO-STAT SUGAR FREE 64)  30 mL Oral BID WC  . folic acid  1 mg Oral QPM  . insulin aspart  0-5 Units Subcutaneous QHS  . insulin aspart  0-9 Units Subcutaneous TID WC  . iron polysaccharides  150 mg Oral BID  . metoCLOPramide  10 mg Oral TID  . mometasone-formoterol  2 puff Inhalation BID  . multivitamin with minerals  1 tablet Oral Daily  . pantoprazole  40 mg Oral Daily  . rifaximin  550 mg Oral BID   Data Reviewed: Basic Metabolic Panel:  Recent Labs Lab 09/02/13 1803 09/02/13 2208 09/03/13 0605 09/04/13 0255 09/05/13 0245  NA 128* 129* 132* 128* 124*  K 4.4 4.3 4.6 5.2 5.1  CL 88* 88* 91* 90* 86*  CO2 27 27 28 26 25   GLUCOSE 58* 56* 96 141* 153*  BUN 47* 48* 42* 48* 52*  CREATININE 1.53* 1.50* 1.45* 1.51* 1.64*  CALCIUM 10.1 10.2 9.8 9.5 9.9   Liver Function Tests:  Recent Labs Lab 09/02/13 2208 09/03/13 0605 09/04/13 0255 09/05/13 0245  AST 26 24 26 23   ALT 16 15 16 16   ALKPHOS 87 81 79 86  BILITOT 0.2* 0.3 <0.2* <0.2*  PROT 7.5 6.9 6.6 7.1  ALBUMIN 3.5 3.2* 3.0* 3.3*   No results found for this basename: LIPASE, AMYLASE,  in the last 168 hours  Recent Labs Lab 09/04/13 0255  AMMONIA 69*   CBC:  Recent Labs Lab 09/02/13 1803 09/03/13 0605 09/04/13 0255 09/05/13 0245  WBC 11.6* 10.3 10.8* 13.7*  NEUTROABS 6.3 3.5  --   --   HGB 9.0* 8.7* 8.7* 8.8*  HCT 28.1* 27.3* 27.4* 27.9*  MCV 82.4 83.0 82.5 81.8  PLT 427* 417* 439* 453*   Cardiac Enzymes:  Recent Labs Lab 09/02/13 1803 09/03/13 0045 09/04/13 1220  TROPONINI <0.30 <0.30 <0.30   BNP (last 3 results)  Recent Labs  07/12/13 2232 07/16/13 0650 09/02/13 1803  PROBNP 4136.0* 933.6* 558.0*   CBG:  Recent Labs Lab 09/04/13 0755 09/04/13 1118 09/04/13 1637 09/04/13 2204 09/05/13 0820  GLUCAP 131* 248* 203* 176* 170*    Recent Results (from the past 240 hour(s))  MRSA PCR SCREENING     Status: None   Collection  Time    09/02/13 10:33 PM      Result Value Ref Range Status   MRSA by PCR NEGATIVE  NEGATIVE Final   Comment:            The GeneXpert MRSA Assay (FDA     approved for NASAL specimens     only), is one component of a     comprehensive MRSA colonization     surveillance program. It is not     intended to diagnose MRSA     infection nor to guide or     monitor treatment for     MRSA infections.     Studies:  Recent x-ray studies have been reviewed in detail by the Attending Physician  Time spent :  21 mins  Cherene Altes, MD [facility] For Consults/Admissions - Flow Manager - [phone] Office  [phone] Pager [phone]  On-Call/Text Page:      [email or web address]      password  [email or web address]  09/05/2013, 10:06 AM   LOS: 3 days

## 2013-09-06 ENCOUNTER — Encounter (HOSPITAL_COMMUNITY): Payer: Self-pay | Admitting: Cardiology

## 2013-09-06 DIAGNOSIS — I5033 Acute on chronic diastolic (congestive) heart failure: Secondary | ICD-10-CM

## 2013-09-06 DIAGNOSIS — R001 Bradycardia, unspecified: Secondary | ICD-10-CM

## 2013-09-06 LAB — GLUCOSE, CAPILLARY
GLUCOSE-CAPILLARY: 156 mg/dL — AB (ref 70–99)
Glucose-Capillary: 156 mg/dL — ABNORMAL HIGH (ref 70–99)
Glucose-Capillary: 186 mg/dL — ABNORMAL HIGH (ref 70–99)
Glucose-Capillary: 157 mg/dL — ABNORMAL HIGH (ref 70–99)

## 2013-09-06 LAB — BASIC METABOLIC PANEL
Anion gap: 14 (ref 5–15)
BUN: 53 mg/dL — ABNORMAL HIGH (ref 6–23)
CO2: 21 mEq/L (ref 19–32)
Calcium: 9.5 mg/dL (ref 8.4–10.5)
Chloride: 84 mEq/L — ABNORMAL LOW (ref 96–112)
Creatinine, Ser: 1.48 mg/dL — ABNORMAL HIGH (ref 0.50–1.10)
GFR calc Af Amer: 43 mL/min — ABNORMAL LOW (ref >90)
GFR calc non Af Amer: 37 mL/min — ABNORMAL LOW (ref >90)
GLUCOSE: 138 mg/dL — AB (ref 70–99)
Potassium: 5.4 mEq/L — ABNORMAL HIGH (ref 3.7–5.3)
Sodium: 119 mEq/L — CL (ref 137–147)

## 2013-09-06 LAB — TSH: TSH: 1.13 u[IU]/mL (ref 0.350–4.500)

## 2013-09-06 MED ORDER — FUROSEMIDE 10 MG/ML IJ SOLN
80.0000 mg | Freq: Two times a day (BID) | INTRAMUSCULAR | Status: DC
  Administered 2013-09-06 – 2013-09-08 (×4): 80 mg via INTRAVENOUS
  Filled 2013-09-06 (×7): qty 8

## 2013-09-06 MED ORDER — FUROSEMIDE 10 MG/ML IJ SOLN
INTRAMUSCULAR | Status: AC
  Filled 2013-09-06: qty 4

## 2013-09-06 MED ORDER — FUROSEMIDE 10 MG/ML IJ SOLN
80.0000 mg | Freq: Two times a day (BID) | INTRAMUSCULAR | Status: DC
  Administered 2013-09-06: 80 mg via INTRAVENOUS
  Filled 2013-09-06: qty 8

## 2013-09-06 NOTE — Progress Notes (Signed)
CRITICAL VALUE ALERT  Critical value received: Na 119  Date of notification:  .09/06/2013  Time of notification:  05/27/13  Critical value read back: Na 119    Nurse who received alert:  Blair Hailey, RN  MD notified (1st page):  K.Schorr  Time of first page: [phone]  MD notified (2nd page):  Time of second page: [phone]  Responding MD:  K.Schorr   Time MD responded:  [phone]

## 2013-09-06 NOTE — Consult Note (Signed)
Primary cardiologist: TK  HPI: 63 year old female for evaluation of bradycardia and diastolic congestive heart failure. Patient also with history of home O2 dependent COPD, previous lung resection for non-small cell lung cancer, cirrhosis, diabetes and hypertension. Echocardiogram June 2015 showed normal LV function, grade 2 diastolic dysfunction, moderate left atrial enlargement, small to moderate pericardial effusion. Nuclear study June 2015 showed an ejection fraction 63% with no evidence of ischemia. She was admitted in June with complaints of worsening dyspnea on exertion, lower extremity edema and weight gain. She was noted also to have junctional bradycardia. She was seen by cardiology and diuresed. Recommendations included discontinuation of trazodone to improve bradycardia. Apparently this resolved. Following discharge she initially returned to her previous level of dyspnea on exertion but this has slowly worsened. She now also has orthopnea and noted increasing weight gain and pedal edema. Readmitted on August 6. Trazodone again felt to be the cause of bradycardia and this has been discontinued. However junctional bradycardia persists and cardiology asked to evaluate. Patient also with intermittent chest pain. The pain is sharp and lasts 30 seconds to 1 minute. Not exertional. Patient has had occasional dizziness but no syncope.  Medications Prior to Admission  Medication Sig Dispense Refill  . albuterol-ipratropium (COMBIVENT) 18-103 MCG/ACT inhaler Inhale 2 puffs into the lungs every 4 (four) hours as needed for wheezing or shortness of breath.       . diazepam (VALIUM) 5 MG tablet Take 1 tablet (5 mg total) by mouth 3 (three) times daily as needed for anxiety.  90 tablet  5  . dicyclomine (BENTYL) 10 MG capsule Take 10 mg by mouth 3 (three) times daily.       [name] esomeprazole (NEXIUM) 40 MG capsule Take 40 mg by mouth every evening.      . Fluticasone-Salmeterol (ADVAIR) 250-50 MCG/DOSE  AEPB Inhale 1 puff into the lungs every 12 (twelve) hours.  409 each  3  . folic acid (FOLVITE) 1 MG tablet Take 1 tablet (1 mg total) by mouth every evening.  90 tablet  3  . hydrALAZINE (APRESOLINE) 25 MG tablet Take 1.5 tablets (37.5 mg total) by mouth 2 (two) times daily.  90 tablet  3  . Insulin Glargine (LANTUS SOLOSTAR) 100 UNIT/ML Solostar Pen Inject 40 Units into the skin 2 (two) times daily.       . iron polysaccharides (NIFEREX) 150 MG capsule Take 1 capsule (150 mg total) by mouth 2 (two) times daily.  60 capsule  1  . isosorbide dinitrate (ISORDIL) 20 MG tablet Take 1 tablet (20 mg total) by mouth 2 (two) times daily.  60 tablet  3  . losartan (COZAAR) 50 MG tablet Take 1 tablet (50 mg total) by mouth daily.  30 tablet  1  . methocarbamol (ROBAXIN) 500 MG tablet Take 1 tablet (500 mg total) by mouth every 6 (six) hours as needed for muscle spasms.  60 tablet  0  . metoCLOPramide (REGLAN) 10 MG tablet Take 1 tablet (10 mg total) by mouth 3 (three) times daily.  270 tablet  3  . Oxycodone HCl 20 MG TABS Take 1 tablet (20 mg total) by mouth every 3 (three) hours as needed (pain).  240 tablet  0  . promethazine (PHENERGAN) 25 MG tablet Take 25 mg by mouth every 6 (six) hours as needed for nausea or vomiting.       . rifaximin (XIFAXAN) 550 MG TABS tablet Take 550 mg by mouth 2 (two) times daily.      [name]  traZODone (DESYREL) 100 MG tablet Take 2 tablets (200 mg total) by mouth at bedtime.  60 tablet  11  . furosemide (LASIX) 40 MG tablet Take 80 mg by mouth 2 (two) times daily.        Allergies  Allergen Reactions  . Acetaminophen Other (See Comments)    Cirrhosis of liver  . Morphine Other (See Comments)    REACTION: Lowers BP  . Other Other (See Comments)    AGENT:  Per pt, cannot take blood thinners due to cirrhosis of the liver  . Penicillins Anaphylaxis and Rash  . Codeine Phosphate Other (See Comments)    REACTION: Stomach cramps  . Hydrocodone-Acetaminophen Other (See  Comments)    REACTION: hallucinations  . Trazodone And Nefazodone     Cardiac arrythmia - DO NOT USE  . Cephalexin Swelling and Rash  . Hydrocodone-Acetaminophen Other (See Comments)    unknown    Past Medical History  Diagnosis Date  . Cirrhosis   . GERD (gastroesophageal reflux disease)   . Cervical disc syndrome   . Lung cancer     squamous cell  . Chronic respiratory failure   . Diverticulitis of colon   . Perforation of colon   . Arthritis   . IBS (irritable bowel syndrome)   . Chronic lower GI bleeding   . Overactive bladder   . Cervical cancer   . Thrombocytopenia     sees Dr. Julien Nordmann   . Splenomegaly   . Depression   . Hypertension   . Asthma   . Chronic headache disorder   . Blood transfusion without reported diagnosis   . Heart murmur   . Hyperlipidemia   . Lung cancer   . COPD (chronic obstructive pulmonary disease)     sees Dr. Gwenette Greet   . Anemia     sees Dr. Julien Nordmann, due to chronic disease and GI losses   . Diabetes mellitus     sees Dr. Cruzita Lederer   . Colostomy care   . Hypercalcemia     Past Surgical History  Procedure Laterality Date  . Appendectomy    . Cholecystectomy    . Colostomy    . Pneumonectomy    . Cervix removed    . Bowel resection    . Esophagogastroduodenoscopy  03/19/2011    Procedure: ESOPHAGOGASTRODUODENOSCOPY (EGD);  Surgeon: [name], MD;  Location: [facility];  Service: Endoscopy;  Laterality: N/A;  . Colonoscopy  03/19/2011    Procedure: COLONOSCOPY;  Surgeon: [name], MD;  Location: [facility];  Service: Endoscopy;  Laterality: N/A;  . Givens capsule study  03/20/2011    Procedure: GIVENS CAPSULE STUDY;  Surgeon: [name], MD;  Location: [facility];  Service: Endoscopy;  Laterality: N/A;  . Small bowel obstruction repair  March 2012  . Umbilical hernia repair  March 2012  . Splenectomy, total N/A 02/24/2013    Procedure: SPLENECTOMY;  Surgeon: Harl Bowie, MD;  Location: [facility];  Service:  General;  Laterality: N/A;  . Orif patella fracture Left 04/21/2013    DR [name]  . Orif patella Left 04/21/2013    Procedure: OPEN REDUCTION INTERNAL (ORIF) FIXATION PATELLA;  Surgeon: Augustin Schooling, MD;  Location: [facility];  Service: Orthopedics;  Laterality: Left;    History   Social History  . Marital Status: Divorced    Spouse Name: N/A    Number of Children: N/A  . Years of Education: N/A   Occupational History  . disabled  Social History Main Topics  . Smoking status: Former Smoker -- 2.00 packs/day for 35 years    Types: Cigarettes    Quit date: 03/24/2002  . Smokeless tobacco: Never Used  . Alcohol Use: No  . Drug Use: No  . Sexual Activity: No   Other Topics Concern  . Not on file   Social History Narrative   Regular exercise: a little   Caffeine use: 2 cups of coffee in am          Family History  Problem Relation Age of Onset  . Coronary artery disease    . Diabetes type II    . Anesthesia problems Neg Hx   . Hypotension Neg Hx   . Malignant hyperthermia Neg Hx   . Pseudochol deficiency Neg Hx   . Heart attack Mother   . Diabetes type II Mother   . Cirrhosis Father     ROS:  no fevers or chills, productive cough, hemoptysis, dysphasia, odynophagia, melena, hematochezia, dysuria, hematuria, rash, seizure activity, claudication. Remaining systems are negative.  Physical Exam:   Blood pressure 117/29, pulse 40, temperature 97.6 F (36.4 C), temperature source Oral, resp. rate 14, height 5' (1.524 m), weight 191 lb (86.637 kg), SpO2 95.00%.  General:  Well developed/well nourished in NAD Skin warm/dry Patient not depressed No peripheral clubbing Back-normal HEENT-normal/normal eyelids Neck supple/normal carotid upstroke bilaterally; no bruits; no JVD; no thyromegaly chest - Diminished BS throughout CV - RRR/normal S1 and S2; no murmurs, rubs or gallops;  PMI nondisplaced Abdomen -mild diffuse tenderness to palpation; colostomy, previous  abdominal surgery Ext-trace edema, no chords, 2+ DP Neuro-grossly nonfocal  ECG 09/02/2013-sinus rhythm with RV conduction delay. 09/04/2013-junctional rhythm at a rate of 44. 09/06/2013-junctional rhythm at a rate of 43.  Results for orders placed during the hospital encounter of 09/02/13 (from the past 48 hour(s))  GLUCOSE, CAPILLARY     Status: Abnormal   Collection Time    09/04/13  4:37 PM      Result Value Ref Range   Glucose-Capillary 203 (*) 70 - 99 mg/dL  GLUCOSE, CAPILLARY     Status: Abnormal   Collection Time    09/04/13 10:04 PM      Result Value Ref Range   Glucose-Capillary 176 (*) 70 - 99 mg/dL   Comment 1 Documented in Chart     Comment 2 Notify RN    CBC     Status: Abnormal   Collection Time    09/05/13  2:45 AM      Result Value Ref Range   WBC 13.7 (*) 4.0 - 10.5 K/uL   RBC 3.41 (*) 3.87 - 5.11 MIL/uL   Hemoglobin 8.8 (*) 12.0 - 15.0 g/dL   HCT 27.9 (*) 36.0 - 46.0 %   MCV 81.8  78.0 - 100.0 fL   MCH 25.8 (*) 26.0 - 34.0 pg   MCHC 31.5  30.0 - 36.0 g/dL   RDW 16.5 (*) 11.5 - 15.5 %   Platelets 453 (*) 150 - 400 K/uL  COMPREHENSIVE METABOLIC PANEL     Status: Abnormal   Collection Time    09/05/13  2:45 AM      Result Value Ref Range   Sodium 124 (*) 137 - 147 mEq/L   Potassium 5.1  3.7 - 5.3 mEq/L   Chloride 86 (*) 96 - 112 mEq/L   CO2 25  19 - 32 mEq/L   Glucose, Bld 153 (*) 70 - 99 mg/dL   BUN 52 (*)  6 - 23 mg/dL   Creatinine, Ser 1.64 (*) 0.50 - 1.10 mg/dL   Calcium 9.9  8.4 - 10.5 mg/dL   Total Protein 7.1  6.0 - 8.3 g/dL   Albumin 3.3 (*) 3.5 - 5.2 g/dL   AST 23  0 - 37 U/L   ALT 16  0 - 35 U/L   Alkaline Phosphatase 86  39 - 117 U/L   Total Bilirubin <0.2 (*) 0.3 - 1.2 mg/dL   GFR calc non Af Amer 33 (*) >90 mL/min   GFR calc Af Amer 38 (*) >90 mL/min   Comment: (NOTE)     The eGFR has been calculated using the CKD EPI equation.     This calculation has not been validated in all clinical situations.     eGFR's persistently <90 mL/min  signify possible Chronic Kidney     Disease.   Anion gap 13  5 - 15  GLUCOSE, CAPILLARY     Status: Abnormal   Collection Time    09/05/13  8:20 AM      Result Value Ref Range   Glucose-Capillary 170 (*) 70 - 99 mg/dL  GLUCOSE, CAPILLARY     Status: Abnormal   Collection Time    09/05/13 12:29 PM      Result Value Ref Range   Glucose-Capillary 168 (*) 70 - 99 mg/dL  GLUCOSE, CAPILLARY     Status: Abnormal   Collection Time    09/05/13  4:42 PM      Result Value Ref Range   Glucose-Capillary 193 (*) 70 - 99 mg/dL  GLUCOSE, CAPILLARY     Status: Abnormal   Collection Time    09/05/13  9:55 PM      Result Value Ref Range   Glucose-Capillary 180 (*) 70 - 99 mg/dL  BASIC METABOLIC PANEL     Status: Abnormal   Collection Time    09/06/13  2:40 AM      Result Value Ref Range   Sodium 119 (*) 137 - 147 mEq/L   Comment: CRITICAL RESULT CALLED TO, READ BACK BY AND VERIFIED WITH:     JAMES L,RN 09/06/13 0435 WAYK   Potassium 5.4 (*) 3.7 - 5.3 mEq/L   Chloride 84 (*) 96 - 112 mEq/L   CO2 21  19 - 32 mEq/L   Glucose, Bld 138 (*) 70 - 99 mg/dL   BUN 53 (*) 6 - 23 mg/dL   Creatinine, Ser 1.48 (*) 0.50 - 1.10 mg/dL   Calcium 9.5  8.4 - 10.5 mg/dL   GFR calc non Af Amer 37 (*) >90 mL/min   GFR calc Af Amer 43 (*) >90 mL/min   Comment: (NOTE)     The eGFR has been calculated using the CKD EPI equation.     This calculation has not been validated in all clinical situations.     eGFR's persistently <90 mL/min signify possible Chronic Kidney     Disease.   Anion gap 14  5 - 15  TSH     Status: None   Collection Time    09/06/13  2:40 AM      Result Value Ref Range   TSH 1.130  0.350 - 4.500 uIU/mL  GLUCOSE, CAPILLARY     Status: Abnormal   Collection Time    09/06/13  8:24 AM      Result Value Ref Range   Glucose-Capillary 156 (*) 70 - 99 mg/dL  GLUCOSE, CAPILLARY     Status: Abnormal  Collection Time    09/06/13 12:05 PM      Result Value Ref Range   Glucose-Capillary 186 (*)  70 - 99 mg/dL    No results found.  Assessment/Plan 1 junctional bradycardia-patient was in sinus rhythm at the time of admission. She has remained in junctional bradycardia over the past 48 hours. She is on no AV nodal blocking agents. This could certainly be contributing to her congestive heart failure due to loss of atrial contraction. I will review with electrophysiology. Question need for pacemaker. 2 acute on chronic diastolic congestive heart failure-the patient developed congestive heart failure in June which was the first time she had documented junctional rhythm. As above, loss of atrial contraction may be contributing to congestive heart failure. I will review with electrophysiology need for pacemaker. She is becoming hyponatremic with diuresis. Hold Lasix for now. Follow renal function and sodium.  3 acute kidney failure-would hold diuretics for now. 4 chest pain-symptoms are atypical. Troponin negative. Previous nuclear study negative. No further ischemia evaluation. 5 cirrhosis-appears relatively stable. Albumin 3.3. Coags normal. 6 COPD-management per primary care.  Kirk Ruths MD 09/06/2013, 2:04 PM

## 2013-09-06 NOTE — Care Management Note (Addendum)
    Page 1 of 2   09/08/2013     2:38:00 PM CARE MANAGEMENT NOTE 09/08/2013  Patient:  [name], [name]   Account Number:  [id number]  Date Initiated:  09/06/2013  Documentation initiated by:  Elissa Hefty  Subjective/Objective Assessment:   adm w hyponatremia     Action/Plan:   lives w fam, pcp dr s fry   Anticipated DC Date:  09/08/2013   Anticipated DC Plan:  [place]  CM consult      [facility] Choice  Resumption Of Svcs/PTA Provider   Choice offered to / List presented to:  C-1 Patient        [place] arranged  HH-1 RN      [place]   Status of service:  Completed, signed off Medicare Important Message given?  YES (If response is "NO", the following Medicare IM given date fields will be blank) Date Medicare IM given:  09/06/2013 Medicare IM given by:  Elissa Hefty Date Additional Medicare IM given:   Additional Medicare IM given by:    Discharge Disposition:  [place]  Per UR Regulation:  Reviewed for med. necessity/level of care/duration of stay  If discussed at [place] of Stay Meetings, dates discussed:   09/07/2013    Comments:  09/08/13- 1430- Marvetta Gibbons RN, BSN [phone] Pt for d/c today, PT recommendation for continued HH-PT- order in epic- in to speak with pt and daughter at bedside- per conversation pt is already active with [facility] through [facility]- and wants to continue services with them. Referral called into [facility] for continued HH-PT and orders faxed. Pt states that she has all needed DME that includes cane, RW, shower bench, 3n1, and home 02 (with [facility]). Daughter to bring home-02 tank for travel home. No further DM needs.

## 2013-09-06 NOTE — Progress Notes (Signed)
[name]  [name] DJS:[id number] DOB: [birth year] DOA: 09/02/2013 PCP: [name], MD   Brief narrative: 63 year old female patient with multiple medical problems including non-alcoholic cirrhosis, known squamous cell lung cancer, chronic diastolic dysfunction, and chronic respiratory failure due to oxygen dependent COPD. She presented to the hospital because of chest pain and shortness of breath. Based on her home scale she had gained 5 pounds. After notifying her physician Lasix dosage was increased for one week. Unfortunately after completing this dosage regimen her symptoms persisted. Since that time she has noted progressive shortness of breath, increasing lower extremity edema, chest pressure and dyspnea on exertion. Upon her home health nurse evaluation on the day of presentation her blood pressure was low at 100/50.  After presentation to the ER she was stable on her usual 2 L nasal cannula oxygen, and she was normotensive with a BP of 135/45. Her chest x-ray revealed stable cardiomegaly with vascular congestion. She also had a new finding of hyponatremia. She appeared to have mild acute kidney injury noting her baseline renal function is 32/0.97 at presentation was 47/1.53. Initial EKG and cardiac enzymes were unremarkable.  HPI/Subjective: Today feels tired and SOB - no CP  Assessment/Plan:  Junctional bradycardia -suspect this is the cause of her presenting SOB/lethargy -evaluated in June for same - Cardiology determined patient's trazodone was the cause and with discontinuation of this medication the arrhythmia reportedly resolved -unfortunately the patient mistakenly continued this medication after discharge -stopped Trazadone - last dose given was 200mg  on 8/6 night  -rate/rhythm has not improved- remains in [place]- Cardiology consulted 8/10 and plan on asking EP to evaluate for possible pacer  Hyponatremia -Na now down to 119 -in  setting of cirrhosis and new dyspnea will resume home Lasix and dc IVFs  Acute kidney injury -baseline crt ~0.9 -Likely from recent increase in diuretic dosage in setting of chronic use of ARB and poor perfusion from above stated arrhythmia -Holding ARB -still not at baseline - follow w/ resumption of lasix   Chest pain  -troponin negative x3 - no acute EKG changes - no more cp reported   Hypotension -steadily improved with cautious hydration   Type II  diabetes mellitus -CBG trending upward - adjust tx and follow   Chronic respiratory failure/ 02 dependent COPD / Chronic diastolic heart failure -Compensated from a respiratory standpoint although today more dyspnea which should resolve with resumption of Lasix  Left patella fracture -Wound care RN consulted  Hyponatremia -follow w/ resumption of lasix (had been on hold since admit) - likely due to liver disease + CHF     Hx of HYPERTENSION -BP improving w/ volume expansion   GERD -Continue PPI  Cirrhosis of liver not due to alcohol -ammonia elevated - cont rifaximin - add low does lactulose short term if mental status becomes affected   DVT prophylaxis: SCDs Code Status: Full Family Communication: No family at bedside Disposition Plan/Expected LOS: SDU  Consultants: Cardiology  Procedures: None  Cultures: None  Antibiotics: None  Objective: Blood pressure 117/29, pulse 40, temperature 97.6 F (36.4 C), temperature source Oral, resp. rate 14, height 5' (1.524 m), weight 191 lb (86.637 kg), SpO2 95.00%.  Intake/Output Summary (Last 24 hours) at 09/06/13 1427 Last data filed at 09/06/13 1318  Gross per 24 hour  Intake   2150 ml  Output   2175 ml  Net    -25 ml   Exam: Gen: No acute  respiratory distress Chest: A few bibasilar crackles, 2L Cardiac: bradycardic but regular c/w JR - no appreciable M or rub  Abdomen: Soft nontender nondistended without obvious hepatosplenomegaly, no ascites Extremities:  Symmetrical in appearance without cyanosis, or clubbing - 1-2+ pedal edema   Scheduled Meds:  Scheduled Meds: . albuterol  2.5 mg Nebulization BID  . antiseptic oral rinse  7 mL Mouth Rinse BID  . dicyclomine  10 mg Oral TID  . feeding supplement (PRO-STAT SUGAR FREE 64)  30 mL Oral BID WC  . folic acid  1 mg Oral QPM  . furosemide  80 mg Intravenous Q12H  . insulin aspart  0-5 Units Subcutaneous QHS  . insulin aspart  0-9 Units Subcutaneous TID WC  . insulin glargine  10 Units Subcutaneous BID  . iron polysaccharides  150 mg Oral BID  . metoCLOPramide  10 mg Oral TID  . mometasone-formoterol  2 puff Inhalation BID  . multivitamin with minerals  1 tablet Oral Daily  . pantoprazole  40 mg Oral Daily  . rifaximin  550 mg Oral BID   Data Reviewed: Basic Metabolic Panel:  Recent Labs Lab 09/02/13 2208 09/03/13 0605 09/04/13 0255 09/05/13 0245 09/06/13 0240  NA 129* 132* 128* 124* 119*  K 4.3 4.6 5.2 5.1 5.4*  CL 88* 91* 90* 86* 84*  CO2 27 28 26 25 21   GLUCOSE 56* 96 141* 153* 138*  BUN 48* 42* 48* 52* 53*  CREATININE 1.50* 1.45* 1.51* 1.64* 1.48*  CALCIUM 10.2 9.8 9.5 9.9 9.5   Liver Function Tests:  Recent Labs Lab 09/02/13 2208 09/03/13 0605 09/04/13 0255 09/05/13 0245  AST 26 24 26 23   ALT 16 15 16 16   ALKPHOS 87 81 79 86  BILITOT 0.2* 0.3 <0.2* <0.2*  PROT 7.5 6.9 6.6 7.1  ALBUMIN 3.5 3.2* 3.0* 3.3*   No results found for this basename: LIPASE, AMYLASE,  in the last 168 hours  Recent Labs Lab 09/04/13 0255  AMMONIA 69*   CBC:  Recent Labs Lab 09/02/13 1803 09/03/13 0605 09/04/13 0255 09/05/13 0245  WBC 11.6* 10.3 10.8* 13.7*  NEUTROABS 6.3 3.5  --   --   HGB 9.0* 8.7* 8.7* 8.8*  HCT 28.1* 27.3* 27.4* 27.9*  MCV 82.4 83.0 82.5 81.8  PLT 427* 417* 439* 453*   Cardiac Enzymes:  Recent Labs Lab 09/02/13 1803 09/03/13 0045 09/04/13 1220  TROPONINI <0.30 <0.30 <0.30   BNP (last 3 results)  Recent Labs  07/12/13 2232 07/16/13 0650  09/02/13 1803  PROBNP 4136.0* 933.6* 558.0*   CBG:  Recent Labs Lab 09/05/13 1229 09/05/13 1642 09/05/13 2155 09/06/13 0824 09/06/13 1205  GLUCAP 168* 193* 180* 156* 186*    Recent Results (from the past 240 hour(s))  MRSA PCR SCREENING     Status: None   Collection Time    09/02/13 10:33 PM      Result Value Ref Range Status   MRSA by PCR NEGATIVE  NEGATIVE Final   Comment:            The GeneXpert MRSA Assay (FDA     approved for NASAL specimens     only), is one component of a     comprehensive MRSA colonization     surveillance program. It is not     intended to diagnose MRSA     infection nor to guide or     monitor treatment for     MRSA infections.     Studies:  Recent  x-ray studies have been reviewed in detail by the Attending Physician  Time spent :  Smith, ANP [facility] For Consults/Admissions - Flow Manager - [phone] Office  [phone] Pager [phone]  On-Call/Text Page:      [email or web address]      password [facility]  09/06/2013, 2:27 PM   LOS: 4 days   I have personally examined this patient and reviewed the entire database. I have reviewed the above note, made any necessary editorial changes, and agree with its content.  Cherene Altes, MD [facility]

## 2013-09-06 NOTE — Progress Notes (Signed)
Pt's HR drops to ~39 while asleep. Up to 59 while awake... Blair Hailey, RN-BC

## 2013-09-07 ENCOUNTER — Encounter (HOSPITAL_COMMUNITY)
Admission: EM | Disposition: A | Discharge status: Home Health | Payer: Self-pay | Source: Home / Self Care | Attending: Internal Medicine

## 2013-09-07 DIAGNOSIS — I959 Hypotension, unspecified: Secondary | ICD-10-CM

## 2013-09-07 DIAGNOSIS — I498 Other specified cardiac arrhythmias: Secondary | ICD-10-CM

## 2013-09-07 DIAGNOSIS — I5032 Chronic diastolic (congestive) heart failure: Secondary | ICD-10-CM

## 2013-09-07 DIAGNOSIS — I509 Heart failure, unspecified: Secondary | ICD-10-CM

## 2013-09-07 DIAGNOSIS — E119 Type 2 diabetes mellitus without complications: Secondary | ICD-10-CM

## 2013-09-07 DIAGNOSIS — J9610 Chronic respiratory failure, unspecified whether with hypoxia or hypercapnia: Secondary | ICD-10-CM

## 2013-09-07 DIAGNOSIS — I495 Sick sinus syndrome: Secondary | ICD-10-CM

## 2013-09-07 DIAGNOSIS — K7460 Unspecified cirrhosis of liver: Secondary | ICD-10-CM

## 2013-09-07 DIAGNOSIS — R079 Chest pain, unspecified: Secondary | ICD-10-CM

## 2013-09-07 DIAGNOSIS — N179 Acute kidney failure, unspecified: Secondary | ICD-10-CM

## 2013-09-07 HISTORY — PX: PERMANENT PACEMAKER INSERTION: SHX5480

## 2013-09-07 LAB — GLUCOSE, CAPILLARY
GLUCOSE-CAPILLARY: 111 mg/dL — AB (ref 70–99)
Glucose-Capillary: 142 mg/dL — ABNORMAL HIGH (ref 70–99)
Glucose-Capillary: 152 mg/dL — ABNORMAL HIGH (ref 70–99)
Glucose-Capillary: 110 mg/dL — ABNORMAL HIGH (ref 70–99)

## 2013-09-07 LAB — BASIC METABOLIC PANEL
ANION GAP: 14 (ref 5–15)
BUN: 55 mg/dL — AB (ref 6–23)
CHLORIDE: 84 meq/L — AB (ref 96–112)
CO2: 23 meq/L (ref 19–32)
CREATININE: 1.51 mg/dL — AB (ref 0.50–1.10)
Calcium: 9.2 mg/dL (ref 8.4–10.5)
GFR calc non Af Amer: 36 mL/min — ABNORMAL LOW (ref >90)
GFR, EST AFRICAN AMERICAN: 42 mL/min — AB (ref >90)
Glucose, Bld: 144 mg/dL — ABNORMAL HIGH (ref 70–99)
Potassium: 5.1 mEq/L (ref 3.7–5.3)
Sodium: 121 mEq/L — CL (ref 137–147)

## 2013-09-07 LAB — URINALYSIS, ROUTINE W REFLEX MICROSCOPIC
Bilirubin Urine: NEGATIVE
Glucose, UA: NEGATIVE mg/dL
Hgb urine dipstick: NEGATIVE
Ketones, ur: NEGATIVE mg/dL
Leukocytes, UA: NEGATIVE
NITRITE: NEGATIVE
PH: 5 (ref 5.0–8.0)
PROTEIN: NEGATIVE mg/dL
Specific Gravity, Urine: 1.01 (ref 1.005–1.030)
Urobilinogen, UA: 0.2 mg/dL (ref 0.0–1.0)

## 2013-09-07 SURGERY — PERMANENT PACEMAKER INSERTION
Anesthesia: LOCAL

## 2013-09-07 MED ORDER — VANCOMYCIN HCL IN DEXTROSE 1-5 GM/200ML-% IV SOLN
1000.0000 mg | INTRAVENOUS | Status: DC
Start: 2013-09-07 — End: 2013-09-07
  Filled 2013-09-07: qty 200

## 2013-09-07 MED ORDER — FUROSEMIDE 80 MG PO TABS
80.0000 mg | ORAL_TABLET | Freq: Two times a day (BID) | ORAL | Status: DC
  Administered 2013-09-07 – 2013-09-08 (×3): 80 mg via ORAL
  Filled 2013-09-07 (×4): qty 1

## 2013-09-07 MED ORDER — LIDOCAINE HCL (PF) 1 % IJ SOLN
INTRAMUSCULAR | Status: AC
  Filled 2013-09-07: qty 30

## 2013-09-07 MED ORDER — CHLORHEXIDINE GLUCONATE 4 % EX LIQD
CUTANEOUS | Status: AC
  Filled 2013-09-07: qty 15

## 2013-09-07 MED ORDER — MIDAZOLAM HCL 5 MG/5ML IJ SOLN
INTRAMUSCULAR | Status: AC
  Filled 2013-09-07: qty 5

## 2013-09-07 MED ORDER — VANCOMYCIN HCL IN DEXTROSE 1-5 GM/200ML-% IV SOLN
1000.0000 mg | Freq: Two times a day (BID) | INTRAVENOUS | Status: AC
  Administered 2013-09-07: 1000 mg via INTRAVENOUS
  Filled 2013-09-07: qty 200

## 2013-09-07 MED ORDER — FENTANYL CITRATE 0.05 MG/ML IJ SOLN
INTRAMUSCULAR | Status: AC
  Filled 2013-09-07: qty 2

## 2013-09-07 MED ORDER — CHLORHEXIDINE GLUCONATE 4 % EX LIQD
60.0000 mL | Freq: Once | CUTANEOUS | Status: DC
Start: 2013-09-07 — End: 2013-09-07

## 2013-09-07 MED ORDER — SODIUM CHLORIDE 0.9 % IR SOLN
80.0000 mg | Status: DC
  Filled 2013-09-07: qty 2

## 2013-09-07 MED ORDER — ONDANSETRON HCL 4 MG/2ML IJ SOLN: 4.0000 mg | Freq: Four times a day (QID) | INTRAMUSCULAR | Status: DC | PRN

## 2013-09-07 MED ORDER — SODIUM CHLORIDE 0.9 % IV SOLN: INTRAVENOUS | Status: DC

## 2013-09-07 MED ORDER — HEPARIN (PORCINE) IN NACL 2-0.9 UNIT/ML-% IJ SOLN
INTRAMUSCULAR | Status: AC
  Filled 2013-09-07: qty 500

## 2013-09-07 NOTE — Progress Notes (Signed)
[name] - Stepdown / ICU Progress Note  [name] SEG:[id number] DOB: April 07, 1950 DOA: 09/02/2013 PCP: Catherine Morale, MD   Brief narrative: 63 year old female patient with multiple medical problems including non-alcoholic cirrhosis, known squamous cell lung cancer, chronic diastolic dysfunction, and chronic respiratory failure due to oxygen dependent COPD. She presented to the hospital because of chest pain and shortness of breath. Based on her home scale she had gained 5 pounds. After notifying her physician Lasix dosage was increased for one week. Unfortunately after completing this dosage regimen her symptoms persisted. Since that time she has noted progressive shortness of breath, increasing lower extremity edema, chest pressure and dyspnea on exertion. Upon her home health nurse evaluation on the day of presentation her blood pressure was low at 100/50.  After presentation to the ER she was stable on her usual 2 L nasal cannula oxygen, and she was normotensive with a BP of 135/45. Her chest x-ray revealed stable cardiomegaly with vascular congestion. She also had a new finding of hyponatremia. She appeared to have mild acute kidney injury noting her baseline renal function is 32/0.97 at presentation was 47/Berry.53. Initial EKG and cardiac enzymes were unremarkable.  HPI/Subjective: Remains SOB but less than yesterday- very anxious regarding upcoming PPM placement  Assessment/Plan:  Junctional bradycardia -suspect this is the cause of her presenting SOB/lethargy -evaluated in June for same - Cardiology determined patient's trazodone was the cause and with discontinuation of this medication the arrhythmia reportedly resolved -unfortunately the patient mistakenly continued this medication after discharge -stopped Trazadone - last dose given was 200mg  on 8/6 night  -8/10: rate/rhythm hd not improved- remained in [place]- Cardiology consulted as well as EP -8/11: For PPM placement  today  Hyponatremia -8/10: Na now down to 119 so diuretics resumed -Na up to 121 but still with edema so cont IV Lasix  Acute kidney injury -baseline crt ~0.9 -Likely from recent increase in diuretic dosage in setting of chronic use of ARB and poor perfusion from above stated arrhythmia -Holding ARB -still not at baseline - follow w/ resumption of lasix   Physical Deconditioning -PT/OT consult -lived alone preadmit so may need CIR  Chest pain  -troponin negative x3 - no acute EKG changes - no more cp reported   Hypotension -resolved with cautious hydration   Type II  diabetes mellitus -CBG trending upward - adjust tx and follow   Chronic respiratory failure/ 02 dependent COPD / Chronic diastolic heart failure -Compensated from a respiratory standpoint -mild dyspnea which should resolve with resumption of Lasix  Left patella fracture -Wound care RN consulted  Hyponatremia -follow w/ resumption of lasix (had been on hold since admit) - likely due to liver disease + CHF     Hx of HYPERTENSION -BP improving w/ volume expansion   GERD -Continue PPI  Cirrhosis of liver not due to alcohol -ammonia elevated - cont rifaximin - add low does lactulose short term if mental status becomes affected   DVT prophylaxis: SCDs Code Status: Full Family Communication: No family at bedside Disposition Plan/Expected LOS: Transfer to Telemetry-PT/OT evaluation pending- suspect will need CIR  Consultants: Cardiology  Procedures: PPM Implantation pending for 8/11  Cultures: None  Antibiotics: None  Objective: Blood pressure 157/34, pulse 55, temperature 98.4 F (36.9 C), temperature source Oral, resp. rate 18, height 5' (Berry.524 m), weight 202 lb (91.627 kg), SpO2 97.00%.  Intake/Output Summary (Last 24 hours) at 09/07/13 1218 Last data filed at 09/07/13 1030  Gross per 24  hour  Intake    880 ml  Output   4480 ml  Net  -3600 ml   Exam: Gen: No acute respiratory  distress Chest: CTA anteriorly, no dyspnea, 2L Cardiac: bradycardic but regular c/w JR - no appreciable M or rub  Abdomen: Soft nontender nondistended without obvious hepatosplenomegaly, no ascites Extremities: Symmetrical in appearance without cyanosis, or clubbing - Berry+ pedal edema   Scheduled Meds:  Scheduled Meds: . antiseptic oral rinse  7 mL Mouth Rinse BID  . chlorhexidine      . chlorhexidine      . dicyclomine  10 mg Oral TID  . feeding supplement (PRO-STAT SUGAR FREE 64)  30 mL Oral BID WC  . folic acid  Berry mg Oral QPM  . furosemide  80 mg Intravenous Q12H  . insulin aspart  0-5 Units Subcutaneous QHS  . insulin aspart  0-9 Units Subcutaneous TID WC  . insulin glargine  10 Units Subcutaneous BID  . iron polysaccharides  150 mg Oral BID  . metoCLOPramide  10 mg Oral TID  . mometasone-formoterol  2 puff Inhalation BID  . multivitamin with minerals  Berry tablet Oral Daily  . pantoprazole  40 mg Oral Daily  . rifaximin  550 mg Oral BID   Data Reviewed: Basic Metabolic Panel:  Recent Labs Lab 09/03/13 0605 09/04/13 0255 09/05/13 0245 09/06/13 0240 09/07/13 0223  NA 132* 128* 124* 119* 121*  K 4.6 5.2 5.Berry 5.4* 5.Berry  CL 91* 90* 86* 84* 84*  CO2 28 26 25 21 23   GLUCOSE 96 141* 153* 138* 144*  BUN 42* 48* 52* 53* 55*  CREATININE Berry.45* Berry.51* Berry.64* Berry.48* Berry.51*  CALCIUM 9.8 9.5 9.9 9.5 9.2   Liver Function Tests:  Recent Labs Lab 09/02/13 2208 09/03/13 0605 09/04/13 0255 09/05/13 0245  AST 26 24 26 23   ALT 16 15 16 16   ALKPHOS 87 81 79 86  BILITOT 0.2* 0.3 <0.2* <0.2*  PROT 7.5 6.9 6.6 7.Berry  ALBUMIN 3.5 3.2* 3.0* 3.3*   No results found for this basename: LIPASE, AMYLASE,  in the last 168 hours  Recent Labs Lab 09/04/13 0255  AMMONIA 69*   CBC:  Recent Labs Lab 09/02/13 1803 09/03/13 0605 09/04/13 0255 09/05/13 0245  WBC 11.6* 10.3 10.8* 13.7*  NEUTROABS 6.3 3.5  --   --   HGB 9.0* 8.7* 8.7* 8.8*  HCT 28.Berry* 27.3* 27.4* 27.9*  MCV 82.4 83.0 82.5  81.8  PLT 427* 417* 439* 453*   Cardiac Enzymes:  Recent Labs Lab 09/02/13 1803 09/03/13 0045 09/04/13 1220  TROPONINI <0.30 <0.30 <0.30   BNP (last 3 results)  Recent Labs  07/12/13 2232 07/16/13 0650 09/02/13 1803  PROBNP 4136.0* 933.6* 558.0*   CBG:  Recent Labs Lab 09/06/13 0824 09/06/13 1205 09/06/13 1734 09/06/13 2137 09/07/13 0747  GLUCAP 156* 186* 157* 156* 142*    Recent Results (from the past 240 hour(s))  MRSA PCR SCREENING     Status: None   Collection Time    09/02/13 10:33 PM      Result Value Ref Range Status   MRSA by PCR NEGATIVE  NEGATIVE Final   Comment:            The GeneXpert MRSA Assay (FDA     approved for NASAL specimens     only), is one component of a     comprehensive MRSA colonization     surveillance program. It is not     intended to  diagnose MRSA     infection nor to guide or     monitor treatment for     MRSA infections.     Studies:  Recent x-ray studies have been reviewed in detail by the Attending Physician  Time spent :  35 mins  Erin Hearing, ANP [facility] For Consults/Admissions - Flow Manager - [phone] Office  [phone] Pager [phone]  On-Call/Text Page:      [email or web address]      password [facility]  09/07/2013, 12:18 PM   LOS: 5 days   Examined Pt and discussed A/P with ANP Ebony Hail and agree with above plan.  Pt w/ Multiple complex medical problems, > 40 min spent in direct medical care

## 2013-09-07 NOTE — Progress Notes (Signed)
PT Cancellation Note  Patient Details Name: [name] MRN: [id number] DOB: 1950/12/13   Cancelled Treatment:    Reason Eval/Treat Not Completed: Patient not medically ready (pt s/p pacemaker placement and on bedrest until next date. Will eval tomorrow)   Lanetta Inch Beth 09/07/2013, 12:29 PM Elwyn Reach, [place]

## 2013-09-07 NOTE — CV Procedure (Signed)
SURGEON:  Cristopher Peru, MD     PREPROCEDURE DIAGNOSIS:  Symptomatic Bradycardia due to sinus node dysfunction    POSTPROCEDURE DIAGNOSIS:  Same as preprocedure diagnosis     PROCEDURES:   1. Pacemaker implantation.     INTRODUCTION: [name] is a 63 y.o. female  with a history of bradycardia who presents today for pacemaker implantation.  The patient reports intermittent episodes of dizziness over the past few months.  No reversible causes have been identified.  The patient therefore presents today for pacemaker implantation.     DESCRIPTION OF PROCEDURE:  Informed written consent was obtained, and   the patient was brought to the electrophysiology lab in a fasting state.  The patient required no sedation for the procedure today.  The patients left chest was prepped and draped in the usual sterile fashion by the EP lab staff. The skin overlying the left deltopectoral region was infiltrated with lidocaine for local analgesia.  A 4-cm incision was made over the left deltopectoral region.  A left subcutaneous pacemaker pocket was fashioned using a combination of sharp and blunt dissection. Electrocautery was required to assure hemostasis.     RA/RV Lead Placement: The left axillary vein was cannulated.  Through the left axillary vein, a St. Jude 1688 45 cm right atrial lead and a St. Jude 1688 52 cm right ventricular lead were advanced with fluoroscopic visualization into the right atrial appendage and right ventricular apical septal positions respectively.  Initial atrial lead P- waves measured 3 mV with impedance of 550 ohms and a threshold of 1.0 V at 0.5 msec.  Right ventricular lead R-waves measured  8 mV with an impedance of 580 ohms and a threshold of 1 V at 0.5 msec.  Both leads were secured to the pectoralis fascia using #2-0 silk over the suture sleeves.   Device Placement:  The leads were then connected to a St. Jude DDD pacemaker.  The pocket was irrigated with copious gentamicin  solution.  The pacemaker was then placed into the pocket.  The pocket was then closed in 2 layers with 2.0 Vicryl suture for the subcutaneous and subcuticular layers.  Steri-Strips and a sterile dressing were then applied.  There were no early apparent complications.     CONCLUSIONS:   1. Successful implantation of a St. Jude dual-chamber pacemaker for symptomatic bradycardia due to sinus node dysfunction.  2. No early apparent complications.           Cristopher Peru, MD 09/07/2013 3:40 PM

## 2013-09-07 NOTE — Consult Note (Signed)
ELECTROPHYSIOLOGY CONSULT NOTE    Patient ID: [name] MRN: [id number], DOB/AGE: 1950-11-28 63 y.o.  Admit date: 09/02/2013 Date of Consult: 09-07-13  Primary Physician: [name], MD Primary Cardiologist: Claiborne Billings  Reason for Consultation: symptomatic bradycardia  HPI:  [name] is a 63 y.o. female with a past medical history significant for COPD (on home O2), diastolic heart failure, previous lung resection for non-small cell lung cancer, cirrhosis, diabetes, and hypertension.  She has been previously been evaluated in June of this year for worsening dyspnea on exertion and lower extremity edema.  At that time, she was found to be in a junctional rhythm.  She was seen by cardiology, diureses, and her trazadone was discontinued to improve bradycardia which resolved.    She has had progressive dyspnea with exertion over the past few weeks and presented to the hospital on 09-02-13 for evaluation.  Her trazadone was again discontinued without significant improvement in heart rates and persistent junctional rhythm.  EP has been asked to evaluate for treatment options.   Echo 07-13-13 demonstrated EF 19-37%, grade 2 diastolic dysfunction, LA 48, small to moderate pericardial effusion.   She is on no other AV nodal blocking agents.  Electrolytes are normal, TSH normal.  Sodium 121 with renal insuffiencey.   She currently denies chest pain, dizziness.  She has not had frank syncope.  ROS is otherwise negative except as outlined above.   Past Medical History  Diagnosis Date  . Cirrhosis   . GERD (gastroesophageal reflux disease)   . Cervical disc syndrome   . Lung cancer     squamous cell  . Chronic respiratory failure   . Diverticulitis of colon   . Perforation of colon   . Arthritis   . IBS (irritable bowel syndrome)   . Chronic lower GI bleeding   . Overactive bladder   . Cervical cancer   . Thrombocytopenia     sees Dr. Julien Nordmann   . Splenomegaly   . Depression   .  Hypertension   . Asthma   . Chronic headache disorder   . Blood transfusion without reported diagnosis   . Heart murmur   . Hyperlipidemia   . COPD (chronic obstructive pulmonary disease)     sees Dr. Gwenette Greet   . Anemia     sees Dr. Julien Nordmann, due to chronic disease and GI losses   . Diabetes mellitus     sees Dr. Cruzita Lederer   . Colostomy care   . Hypercalcemia      Surgical History:  Past Surgical History  Procedure Laterality Date  . Appendectomy    . Cholecystectomy    . Colostomy    . Pneumonectomy    . Cervix removed    . Bowel resection    . Esophagogastroduodenoscopy  03/19/2011    Procedure: ESOPHAGOGASTRODUODENOSCOPY (EGD);  Surgeon: [name], MD;  Location: [facility];  Service: Endoscopy;  Laterality: N/A;  . Colonoscopy  03/19/2011    Procedure: COLONOSCOPY;  Surgeon: [name], MD;  Location: [facility];  Service: Endoscopy;  Laterality: N/A;  . Givens capsule study  03/20/2011    Procedure: GIVENS CAPSULE STUDY;  Surgeon: [name], MD;  Location: [facility];  Service: Endoscopy;  Laterality: N/A;  . Small bowel obstruction repair  March 2012  . Umbilical hernia repair  March 2012  . Splenectomy, total N/A 02/24/2013    Procedure: SPLENECTOMY;  Surgeon: Harl Bowie, MD;  Location: [facility];  Service: General;  Laterality: N/A;  . Orif patella fracture Left 04/21/2013    DR [name]  . Orif patella Left 04/21/2013    Procedure: OPEN REDUCTION INTERNAL (ORIF) FIXATION PATELLA;  Surgeon: Augustin Schooling, MD;  Location: [facility];  Service: Orthopedics;  Laterality: Left;     Prescriptions prior to admission  Medication Sig Dispense Refill  . albuterol-ipratropium (COMBIVENT) 18-103 MCG/ACT inhaler Inhale 2 puffs into the lungs every 4 (four) hours as needed for wheezing or shortness of breath.       . diazepam (VALIUM) 5 MG tablet Take 1 tablet (5 mg total) by mouth 3 (three) times daily as needed for anxiety.  90 tablet  5  . dicyclomine (BENTYL) 10  MG capsule Take 10 mg by mouth 3 (three) times daily.       [name] esomeprazole (NEXIUM) 40 MG capsule Take 40 mg by mouth every evening.      . Fluticasone-Salmeterol (ADVAIR) 250-50 MCG/DOSE AEPB Inhale 1 puff into the lungs every 12 (twelve) hours.  606 each  3  . folic acid (FOLVITE) 1 MG tablet Take 1 tablet (1 mg total) by mouth every evening.  90 tablet  3  . hydrALAZINE (APRESOLINE) 25 MG tablet Take 1.5 tablets (37.5 mg total) by mouth 2 (two) times daily.  90 tablet  3  . Insulin Glargine (LANTUS SOLOSTAR) 100 UNIT/ML Solostar Pen Inject 40 Units into the skin 2 (two) times daily.       . iron polysaccharides (NIFEREX) 150 MG capsule Take 1 capsule (150 mg total) by mouth 2 (two) times daily.  60 capsule  1  . isosorbide dinitrate (ISORDIL) 20 MG tablet Take 1 tablet (20 mg total) by mouth 2 (two) times daily.  60 tablet  3  . losartan (COZAAR) 50 MG tablet Take 1 tablet (50 mg total) by mouth daily.  30 tablet  1  . methocarbamol (ROBAXIN) 500 MG tablet Take 1 tablet (500 mg total) by mouth every 6 (six) hours as needed for muscle spasms.  60 tablet  0  . metoCLOPramide (REGLAN) 10 MG tablet Take 1 tablet (10 mg total) by mouth 3 (three) times daily.  270 tablet  3  . Oxycodone HCl 20 MG TABS Take 1 tablet (20 mg total) by mouth every 3 (three) hours as needed (pain).  240 tablet  0  . promethazine (PHENERGAN) 25 MG tablet Take 25 mg by mouth every 6 (six) hours as needed for nausea or vomiting.       . rifaximin (XIFAXAN) 550 MG TABS tablet Take 550 mg by mouth 2 (two) times daily.      . traZODone (DESYREL) 100 MG tablet Take 2 tablets (200 mg total) by mouth at bedtime.  60 tablet  11  . furosemide (LASIX) 40 MG tablet Take 80 mg by mouth 2 (two) times daily.        Inpatient Medications:  . antiseptic oral rinse  7 mL Mouth Rinse BID  . dicyclomine  10 mg Oral TID  . feeding supplement (PRO-STAT SUGAR FREE 64)  30 mL Oral BID WC  . folic acid  1 mg Oral QPM  . furosemide  80 mg  Intravenous Q12H  . insulin aspart  0-5 Units Subcutaneous QHS  . insulin aspart  0-9 Units Subcutaneous TID WC  . insulin glargine  10 Units Subcutaneous BID  . iron polysaccharides  150 mg Oral BID  . metoCLOPramide  10 mg Oral TID  . mometasone-formoterol  2 puff Inhalation  BID  . multivitamin with minerals  1 tablet Oral Daily  . pantoprazole  40 mg Oral Daily  . rifaximin  550 mg Oral BID    Allergies:  Allergies  Allergen Reactions  . Acetaminophen Other (See Comments)    Cirrhosis of liver  . Morphine Other (See Comments)    REACTION: Lowers BP  . Other Other (See Comments)    AGENT:  Per pt, cannot take blood thinners due to cirrhosis of the liver  . Penicillins Anaphylaxis and Rash  . Codeine Phosphate Other (See Comments)    REACTION: Stomach cramps  . Hydrocodone-Acetaminophen Other (See Comments)    REACTION: hallucinations  . Trazodone And Nefazodone     Cardiac arrythmia - DO NOT USE  . Cephalexin Swelling and Rash  . Hydrocodone-Acetaminophen Other (See Comments)    unknown    History   Social History  . Marital Status: Divorced    Spouse Name: N/A    Number of Children: N/A  . Years of Education: N/A   Occupational History  . disabled    Social History Main Topics  . Smoking status: Former Smoker -- 2.00 packs/day for 35 years    Types: Cigarettes    Quit date: 03/24/2002  . Smokeless tobacco: Never Used  . Alcohol Use: No  . Drug Use: No  . Sexual Activity: No   Other Topics Concern  . Not on file   Social History Narrative   Regular exercise: a little   Caffeine use: 2 cups of coffee in am           Family History  Problem Relation Age of Onset  . Coronary artery disease    . Diabetes type II    . Anesthesia problems Neg Hx   . Hypotension Neg Hx   . Malignant hyperthermia Neg Hx   . Pseudochol deficiency Neg Hx   . Heart attack Mother   . Diabetes type II Mother   . Cirrhosis Father     BP 157/34  Pulse 55  Temp(Src) 98.4  F (36.9 C) (Oral)  Resp 18  Ht 5' (1.524 m)  Wt 202 lb (91.627 kg)  BMI 39.45 kg/m2  SpO2 97%  Physical Exam: diskempt appearing middle aged woman, NAD HEENT: Unremarkable,[place], AT Neck:  6 JVD, no thyromegally Back:  No CVA tenderness Lungs:  Clear with no wheezes, rales, or rhonchi HEART:  Regular brady rhythm, no murmurs, no rubs, no clicks Abd:  soft, positive bowel sounds, no organomegally, no rebound, no guarding Ext:  2 plus pulses, no edema, no cyanosis, no clubbing Skin:  No rashes no nodules Neuro:  CN II through XII intact, motor grossly intact   Labs:   Lab Results  Component Value Date   WBC 13.7* 09/05/2013   HGB 8.8* 09/05/2013   HCT 27.9* 09/05/2013   MCV 81.8 09/05/2013   PLT 453* 09/05/2013    Recent Labs Lab 09/05/13 0245  09/07/13 0223  NA 124*  < > 121*  K 5.1  < > 5.1  CL 86*  < > 84*  CO2 25  < > 23  BUN 52*  < > 55*  CREATININE 1.64*  < > 1.51*  CALCIUM 9.9  < > 9.2  PROT 7.1  --   --   BILITOT <0.2*  --   --   ALKPHOS 86  --   --   ALT 16  --   --   AST 23  --   --  GLUCOSE 153*  < > 144*  < > = values in this interval not displayed.  Radiology/Studies: Dg Chest 2 View 09/02/2013   CLINICAL DATA:  Chest pain  EXAM: CHEST  2 VIEW  COMPARISON:  07/12/2013  FINDINGS: Stable cardiomegaly with chronic vascular and interstitial prominence. Postop changes in left hilum/mediastinum. Parenchymal scarring in the left lower lobe. No new superimposed pneumonia, collapse or consolidation. No effusion or pneumothorax. Trachea midline. Prior cholecystectomy noted.  IMPRESSION: Stable cardiomegaly with vascular congestion and postop findings.   Electronically Signed   By: Daryll Brod M.D.   On: 09/02/2013 19:05    EKG: junctional rhythm, rate 43, narrow QRS  TELEMETRY:  Junctional rhythm, rates 40-50  A/P 1. Symptomatic sinus node dysfunction. 2. Junctional bradycardia 3. COPD 4. Chronic diastolic heart failure, exacerbated by #1 and and #2. Rec: I have  discussed the treatment options with the patient she was to proceed with permanent pacemaker insertion. The risk, goals, benefits, and expectations of pacemaker insertion have been reviewed.  Cristopher Peru, M.D.

## 2013-09-08 ENCOUNTER — Inpatient Hospital Stay (HOSPITAL_COMMUNITY): Payer: Medicare Other

## 2013-09-08 DIAGNOSIS — I4589 Other specified conduction disorders: Secondary | ICD-10-CM

## 2013-09-08 DIAGNOSIS — I5031 Acute diastolic (congestive) heart failure: Secondary | ICD-10-CM

## 2013-09-08 LAB — BASIC METABOLIC PANEL WITH GFR
Anion gap: 12 (ref 5–15)
BUN: 49 mg/dL — ABNORMAL HIGH (ref 6–23)
CO2: 29 meq/L (ref 19–32)
Calcium: 9.5 mg/dL (ref 8.4–10.5)
Chloride: 91 meq/L — ABNORMAL LOW (ref 96–112)
Creatinine, Ser: 1.29 mg/dL — ABNORMAL HIGH (ref 0.50–1.10)
GFR calc Af Amer: 51 mL/min — ABNORMAL LOW
GFR calc non Af Amer: 44 mL/min — ABNORMAL LOW
Glucose, Bld: 104 mg/dL — ABNORMAL HIGH (ref 70–99)
Potassium: 4.3 meq/L (ref 3.7–5.3)
Sodium: 132 meq/L — ABNORMAL LOW (ref 137–147)

## 2013-09-08 LAB — GLUCOSE, CAPILLARY
Glucose-Capillary: 95 mg/dL (ref 70–99)
Glucose-Capillary: 146 mg/dL — ABNORMAL HIGH (ref 70–99)
Glucose-Capillary: 114 mg/dL — ABNORMAL HIGH (ref 70–99)

## 2013-09-08 LAB — MAGNESIUM: Magnesium: 1.9 mg/dL (ref 1.5–2.5)

## 2013-09-08 LAB — AMMONIA: Ammonia: 47 umol/L (ref 11–60)

## 2013-09-08 NOTE — Evaluation (Signed)
Occupational Therapy Evaluation Patient Details Name: [name] MRN: [id number] DOB: 03-05-1950 Today's Date: 09/08/2013    History of Present Illness 63 year old female patient with multiple medical problems including non-alcoholic cirrhosis, known squamous cell lung cancer, chronic diastolic dysfunction, and chronic respiratory failure due to oxygen dependent COPD. She presented to the hospital because of chest pain and shortness of breath. Based on her home scale she had gained 5 pounds. After notifying her physician Lasix dosage was increased for one week. Unfortunately after completing this dosage regimen her symptoms persisted. Since that time she has noted progressive shortness of breath, increasing lower extremity edema, chest pressure and dyspnea on exertion. Upon her home health nurse evaluation on the day of presentation her blood pressure was low at 100/50.   Clinical Impression   Pt is currently at a supervision level for most selfcare tasks.  She will have 24 hour supervision at discharge from family.  She is familiar and uses DME and AE for selfcare tasks.  No further OT needs at this time.  Pt was educated on pacemaker precautions as well.     Follow Up Recommendations  No OT follow up    Equipment Recommendations  None recommended by OT       Precautions / Restrictions Precautions Precautions: Fall Precaution Comments: O2 dependent, new pacemaker placement Restrictions Weight Bearing Restrictions: No      Mobility Bed Mobility               General bed mobility comments: Pt in recliner when OT arrived.   Transfers Overall transfer level: Needs assistance     Sit to Stand: Supervision              Balance Overall balance assessment: Needs assistance Sitting-balance support: Bilateral upper extremity supported Sitting balance-Leahy Scale: Normal     Standing balance support: Bilateral upper extremity supported Standing balance-Leahy Scale:  Fair                              ADL Overall ADL's : Needs assistance/impaired Eating/Feeding: Independent   Grooming: Wash/dry hands;Wash/dry face;Oral care;Supervision/safety   Upper Body Bathing: Set up;Sitting   Lower Body Bathing: Supervison/ safety;Sit to/from stand   Upper Body Dressing : Set up;Sitting   Lower Body Dressing: Minimal assistance;Sit to/from stand Lower Body Dressing Details (indicate cue type and reason): Pt reports using AE for LB selfcare. Toilet Transfer: Warehouse manager and Hygiene: Sit to/from stand;Supervision/safety       Functional mobility during ADLs: Supervision/safety General ADL Comments: Pt reports having a reacher, sockaide, and LH shoe horn which she used at home along with the elastic laces in her shoes.  Discussed using a LH sponge in the shower as well which pt will purchase.  No further acute OT needs. Pt has all needed DME.        Perception Perception Perception Tested?: No   Praxis Praxis Praxis tested?: Not tested    Pertinent Vitals/Pain Pain Assessment: 0-10 Pain Score: 0-No pain     Hand Dominance Right   Extremity/Trunk Assessment Upper Extremity Assessment Upper Extremity Assessment: LUE deficits/detail LUE Deficits / Details: Pt with decreased AROM shoulder flexion 0-50 degrees.  New pacemaker placement on 8/11 and pt has precautions which prevent further shoulder testing.  Full  AROMfor elbow flexion and grip however   Lower Extremity Assessment Lower Extremity Assessment: Defer to PT evaluation   Cervical / Trunk  Assessment Cervical / Trunk Assessment: Normal   Communication Communication Communication: No difficulties   Cognition Arousal/Alertness: Awake/alert Behavior During Therapy: WFL for tasks assessed/performed Overall Cognitive Status: Within Functional Limits for tasks assessed                                Home Living  Family/patient expects to be discharged to:: Private residence Living Arrangements: Children Available Help at Discharge: Available 24 hours/day Type of Home: House Home Access: Stairs to enter [employer] of Steps: 3 Entrance Stairs-Rails: None Home Layout: One level     Bathroom Shower/Tub: Tub/shower unit Shower/tub characteristics: Architectural technologist: Standard Bathroom Accessibility: Yes How Accessible: Accessible via walker [place] - single point;Walker - 2 wheels;Bedside commode;Tub bench          Prior Functioning/Environment Level of Independence: Needs assistance    ADL's / Homemaking Assistance Needed: pts granddaughter assists with LE bathing and dressing, meal prep and house keeping                                  End of Session Equipment Utilized During Treatment: Rolling walker Nurse Communication: Mobility status  Activity Tolerance: Patient tolerated treatment well Patient left: in chair;with call bell/phone within reach   Time: 1251-1334 OT Time Calculation (min): 43 min Charges:  OT General Charges $OT Visit: 1 Procedure OT Evaluation $Initial OT Evaluation Tier I: 1 Procedure OT Treatments $Self Care/Home Management : 23-37 mins G-Codes:    Martiza Speth OTR/L September 17, 2013, 4:31 PM

## 2013-09-08 NOTE — Discharge Summary (Addendum)
Physician Discharge Summary  [name] KZS:[id number] DOB: Nov 14, 1950 DOA: 09/02/2013  PCP: [name], MD  Admit date: 09/02/2013 Discharge date: 09/08/2013  Time spent: >45 minutes  Recommendations for Outpatient Follow-up:  1. F/u with Dr Cristopher Peru- the office will call patient for the appt 2. bmet in 1 wk to f/u on sodium level and renal function  Discharge Diagnoses:  Principal Problem:   Junctional Bradycardia Active Problems:   AKI (acute kidney injury)   Hyponatremia   Hypotension   Acute on chronic diastolic heart failure   Type II or unspecified type diabetes mellitus with neurological manifestations, uncontrolled   HYPERTENSION   COPD   GERD   Chronic respiratory failure   Cirrhosis of liver not due to alcohol   Left patella fracture   Chronic diastolic heart failure   Chest pain   Discharge Condition: stable  Diet recommendation: heart healthy, carb modified  Filed Weights   09/05/13 0022 09/07/13 0300 09/08/13 0447  Weight: 86.637 kg (191 lb) 91.627 kg (202 lb) 86.274 kg (190 lb 3.2 oz)    History of present illness:  63 year old female patient with multiple medical problems including non-alcoholic cirrhosis, known squamous cell lung cancer, chronic diastolic dysfunction, and chronic respiratory failure with oxygen dependent COPD. She presented to the hospital because of chest pain and shortness of breath. Based on her home scale she had gained 5 pounds. After notifying her physician, her Lasix dosage was increased for one week. Unfortunately after completing this dosage regimen her symptoms persisted. Since that time she has noted progressive shortness of breath, increasing lower extremity edema, chest pressure and dyspnea on exertion. Upon her home health nurse evaluation on the day of presentation her blood pressure was low at 100/50.  After presentation to the ER it was noted that she was stable on her usual 2 L nasal cannula oxygen, and she was  normotensive with a BP of 135/45.  Her chest x-ray revealed stable cardiomegaly with vascular congestion. She also had a new finding of hyponatremia. She appeared to have mild acute kidney injury noting her baseline renal function is 32/0.97 at presentation was 47/1.53. Initial EKG and cardiac enzymes were unremarkable. She was admitted in June with complaints of worsening dyspnea on exertion, lower extremity edema and weight gain. She was noted also to have junctional bradycardia. She was seen by cardiology and diuresed. Recommendations included discontinuation of trazodone to improve bradycardia.    Hospital Course:  Junctional bradycardia  -suspect this is the cause of her presenting SOB/lethargy  -evaluated in June for same - Cardiology determined patient's trazodone was the cause and with discontinuation of this medication the arrhythmia reportedly resolved  -unfortunately the patient mistakenly continued this medication after discharge  -stopped Trazadone - last dose given was 200mg  on 8/6 night  -8/10: rate/rhythm hd not improved- remained in [place]- Cardiology consulted as well as EP  -8/11: underwent pacemaker placement without complications - states her symptoms are much improved from admission and is anxious to be discharged home today  Hyponatremia  -sodium noted to be 126 on admission - initially thought to be due to over- diuresis and lasix held but sodium continued to drop- Nadir was 119 -8/10: Sodium 119-  diuretics resumed  -Na up to 132 on d/c today- resume home dose of Lasix today upon discharge - discharge weight 190 lbs  Acute kidney injury  -baseline crt 1.1- 1.2 -Likely from recent increase in diuretic dosage in setting of chronic use of ARB and  poor perfusion from above stated arrhythmia  - Cr improved to 1.29 today -  ARB was on hold will resume today  Physical Deconditioning  -PT/OT consult - HHPT recommended to be resumed on d/c  Chest pain  -troponin negative  x3 - no acute EKG changes - no more cp reported   Hypotension  -resolved with cautious hydration   Type II diabetes mellitus  -CBG stable  Chronic respiratory failure/ 02 dependent COPD / Chronic diastolic heart failure  -Compensated from a respiratory standpoint  - pulse ox 99% on 2 L O2  Left patella fracture  - outpt PT  Hyponatremia  -improved significantly w/ resumption of lasix (had been on hold since admit) - likely due to liver disease + CHF   Hx of HYPERTENSION  -BP improving w/ volume expansion   GERD  -Continue PPI   Cirrhosis of liver not due to alcohol  -ammonia elevated - cont rifaximin   Procedures:  8/11 - permanent pacemaker placed  Consultations:  Cardiology  Discharge Exam: Filed Vitals:   09/08/13 1426  BP: 133/51  Pulse: 84  Temp:   Resp: 19   Filed Weights   09/05/13 0022 09/07/13 0300 09/08/13 0447  Weight: 86.637 kg (191 lb) 91.627 kg (202 lb) 86.274 kg (190 lb 3.2 oz)   General: AAO x 3, no distress, sitting in chair eating Cardiovascular: RRR, no murmurs Respiratory: CTA b/l - 99% on 2 L O2  Discharge Instructions You were cared for by a hospitalist during your hospital stay. If you have any questions about your discharge medications or the care you received while you were in the hospital after you are discharged, you can call the unit and asked to speak with the hospitalist on call if the hospitalist that took care of you is not available. Once you are discharged, your primary care physician will handle any further medical issues. Please note that NO REFILLS for any discharge medications will be authorized once you are discharged, as it is imperative that you return to your primary care physician (or establish a relationship with a primary care physician if you do not have one) for your aftercare needs so that they can reassess your need for medications and monitor your lab values.      Discharge Instructions   Diet - low sodium  heart healthy    Complete by:  As directed   Carb modified     Increase activity slowly    Complete by:  As directed             Medication List         albuterol-ipratropium 18-103 MCG/ACT inhaler  Commonly known as:  COMBIVENT  Inhale 2 puffs into the lungs every 4 (four) hours as needed for wheezing or shortness of breath.     diazepam 5 MG tablet  Commonly known as:  VALIUM  Take 1 tablet (5 mg total) by mouth 3 (three) times daily as needed for anxiety.     dicyclomine 10 MG capsule  Commonly known as:  BENTYL  Take 10 mg by mouth 3 (three) times daily.     esomeprazole 40 MG capsule  Commonly known as:  NEXIUM  Take 40 mg by mouth every evening.     Fluticasone-Salmeterol 250-50 MCG/DOSE Aepb  Commonly known as:  ADVAIR  Inhale 1 puff into the lungs every 12 (twelve) hours.     folic acid 1 MG tablet  Commonly known as:  FOLVITE  Take 1  tablet (1 mg total) by mouth every evening.     furosemide 40 MG tablet  Commonly known as:  LASIX  Take 80 mg by mouth 2 (two) times daily.     hydrALAZINE 25 MG tablet  Commonly known as:  APRESOLINE  Take 1.5 tablets (37.5 mg total) by mouth 2 (two) times daily.     iron polysaccharides 150 MG capsule  Commonly known as:  NIFEREX  Take 1 capsule (150 mg total) by mouth 2 (two) times daily.     isosorbide dinitrate 20 MG tablet  Commonly known as:  ISORDIL  Take 1 tablet (20 mg total) by mouth 2 (two) times daily.     LANTUS SOLOSTAR 100 UNIT/ML Solostar Pen  Generic drug:  Insulin Glargine  Inject 40 Units into the skin 2 (two) times daily.     losartan 50 MG tablet  Commonly known as:  COZAAR  Take 1 tablet (50 mg total) by mouth daily.     methocarbamol 500 MG tablet  Commonly known as:  ROBAXIN  Take 1 tablet (500 mg total) by mouth every 6 (six) hours as needed for muscle spasms.     metoCLOPramide 10 MG tablet  Commonly known as:  REGLAN  Take 1 tablet (10 mg total) by mouth 3 (three) times daily.      Oxycodone HCl 20 MG Tabs  Take 1 tablet (20 mg total) by mouth every 3 (three) hours as needed (pain).     promethazine 25 MG tablet  Commonly known as:  PHENERGAN  Take 25 mg by mouth every 6 (six) hours as needed for nausea or vomiting.     rifaximin 550 MG Tabs tablet  Commonly known as:  XIFAXAN  Take 550 mg by mouth 2 (two) times daily.     traZODone 100 MG tablet  Commonly known as:  DESYREL  Take 2 tablets (200 mg total) by mouth at bedtime.       Allergies  Allergen Reactions  . Acetaminophen Other (See Comments)    Cirrhosis of liver  . Morphine Other (See Comments)    REACTION: Lowers BP  . Other Other (See Comments)    AGENT:  Per pt, cannot take blood thinners due to cirrhosis of the liver  . Penicillins Anaphylaxis and Rash  . Codeine Phosphate Other (See Comments)    REACTION: Stomach cramps  . Hydrocodone-Acetaminophen Other (See Comments)    REACTION: hallucinations  . Trazodone And Nefazodone     Cardiac arrythmia - DO NOT USE  . Cephalexin Swelling and Rash  . Hydrocodone-Acetaminophen Other (See Comments)    unknown   Follow-up Information   Follow up with [facility]. (resume HH-PT)    Contact information:   [place] [address] [phone]       Follow up with Cristopher Peru, MD. (his office will call you for a follow up appointment- please call them if they DO NOT call you)    Specialty:  Cardiology   Contact information:   [address] [place] [address] [phone]        The results of significant diagnostics from this hospitalization (including imaging, microbiology, ancillary and laboratory) are listed below for reference.    Significant Diagnostic Studies: Dg Chest 2 View  09/08/2013   CLINICAL DATA:  Post pacemaker with some chest pain  EXAM: CHEST  2 VIEW  COMPARISON:  09/02/2013  FINDINGS: Duo lead pacer has been placed with  generator over left thorax. No pneumothorax or  effusion. Stable mild left base atelectasis or scarring.  Stable mild cardiac enlargement with postoperative change in the left hilum.  IMPRESSION: No active cardiopulmonary disease.   Electronically Signed   By: Skipper Cliche M.D.   On: 09/08/2013 08:07   Dg Chest 2 View  09/02/2013   CLINICAL DATA:  Chest pain  EXAM: CHEST  2 VIEW  COMPARISON:  07/12/2013  FINDINGS: Stable cardiomegaly with chronic vascular and interstitial prominence. Postop changes in left hilum/mediastinum. Parenchymal scarring in the left lower lobe. No new superimposed pneumonia, collapse or consolidation. No effusion or pneumothorax. Trachea midline. Prior cholecystectomy noted.  IMPRESSION: Stable cardiomegaly with vascular congestion and postop findings.   Electronically Signed   By: Daryll Brod M.D.   On: 09/02/2013 19:05    Microbiology: Recent Results (from the past 240 hour(s))  MRSA PCR SCREENING     Status: None   Collection Time    09/02/13 10:33 PM      Result Value Ref Range Status   MRSA by PCR NEGATIVE  NEGATIVE Final   Comment:            The GeneXpert MRSA Assay (FDA     approved for NASAL specimens     only), is one component of a     comprehensive MRSA colonization     surveillance program. It is not     intended to diagnose MRSA     infection nor to guide or     monitor treatment for     MRSA infections.     Labs: Basic Metabolic Panel:  Recent Labs Lab 09/04/13 0255 09/05/13 0245 09/06/13 0240 09/07/13 0223 09/08/13 0323  NA 128* 124* 119* 121* 132*  K 5.2 5.1 5.4* 5.1 4.3  CL 90* 86* 84* 84* 91*  CO2 26 25 21 23 29   GLUCOSE 141* 153* 138* 144* 104*  BUN 48* 52* 53* 55* 49*  CREATININE 1.51* 1.64* 1.48* 1.51* 1.29*  CALCIUM 9.5 9.9 9.5 9.2 9.5  MG  --   --   --   --  1.9   Liver Function Tests:  Recent Labs Lab 09/02/13 2208 09/03/13 0605 09/04/13 0255 09/05/13 0245  AST 26 24 26 23   ALT 16 15 16 16   ALKPHOS 87 81 79 86  BILITOT 0.2* 0.3 <0.2* <0.2*  PROT 7.5 6.9  6.6 7.1  ALBUMIN 3.5 3.2* 3.0* 3.3*   No results found for this basename: LIPASE, AMYLASE,  in the last 168 hours  Recent Labs Lab 09/04/13 0255 09/08/13 0323  AMMONIA 69* 47   CBC:  Recent Labs Lab 09/02/13 1803 09/03/13 0605 09/04/13 0255 09/05/13 0245  WBC 11.6* 10.3 10.8* 13.7*  NEUTROABS 6.3 3.5  --   --   HGB 9.0* 8.7* 8.7* 8.8*  HCT 28.1* 27.3* 27.4* 27.9*  MCV 82.4 83.0 82.5 81.8  PLT 427* 417* 439* 453*   Cardiac Enzymes:  Recent Labs Lab 09/02/13 1803 09/03/13 0045 09/04/13 1220  TROPONINI <0.30 <0.30 <0.30   BNP: BNP (last 3 results)  Recent Labs  07/12/13 2232 07/16/13 0650 09/02/13 1803  PROBNP 4136.0* 933.6* 558.0*   CBG:  Recent Labs Lab 09/07/13 1248 09/07/13 1633 09/07/13 2153 09/08/13 0732 09/08/13 1149  GLUCAP 152* 110* 111* 95 146*       SignedDebbe Odea, MD  [facility] 09/08/2013, 2:39 PM

## 2013-09-08 NOTE — Progress Notes (Signed)
NUTRITION FOLLOW UP  Intervention:    Continue CHO-modified diet with Prostat BID with meals to maximize protein intake to support wound healing.  Nutrition Dx:   Increased nutrient needs related to wound as evidenced by estimated kcal and protein needs, ongoing.  Goal:   Intake to meet >90% of estimated nutrition needs, met.  Monitor:   PO intake, labs, weight trend, wound healing.  Assessment:   The patient presented with complaints of 5 pound weight gain, chest pressure, shortness of breath and worsening leg swelling.  Patient was educated by RD on 8/7 on ways to incorporate high protein foods into her diet to support increased protein needs for wound healing. She continues to eat 100% of meals. Also receiving Pro-stat BID with meals. Intake is adequate to meet nutrition needs.  Height: Ht Readings from Last 1 Encounters:  09/02/13 5' (1.524 m)    Weight Status:   Wt Readings from Last 1 Encounters:  09/08/13 190 lb 3.2 oz (86.274 kg)  09/03/13  186 lb 4.6 oz (84.5 kg)   Re-estimated needs:  Kcal: 1400-1600  Protein: 105-120 g  Fluid: 2 L/day  Skin: open wound on left knee  Diet Order: Carb Control   Intake/Output Summary (Last 24 hours) at 09/08/13 1326 Last data filed at 09/08/13 1152  Gross per 24 hour  Intake    240 ml  Output   6901 ml  Net  -6661 ml    Last BM: 8/10   Labs:   Recent Labs Lab 09/06/13 0240 09/07/13 0223 09/08/13 0323  NA 119* 121* 132*  K 5.4* 5.1 4.3  CL 84* 84* 91*  CO2 21 23 29   BUN 53* 55* 49*  CREATININE 1.48* 1.51* 1.29*  CALCIUM 9.5 9.2 9.5  MG  --   --  1.9  GLUCOSE 138* 144* 104*    CBG (last 3)   Recent Labs  09/07/13 2153 09/08/13 0732 09/08/13 1149  GLUCAP 111* 95 146*    Scheduled Meds: . antiseptic oral rinse  7 mL Mouth Rinse BID  . dicyclomine  10 mg Oral TID  . feeding supplement (PRO-STAT SUGAR FREE 64)  30 mL Oral BID WC  . folic acid  1 mg Oral QPM  . furosemide  80 mg Intravenous Q12H   . furosemide  80 mg Oral BID  . insulin aspart  0-5 Units Subcutaneous QHS  . insulin aspart  0-9 Units Subcutaneous TID WC  . insulin glargine  10 Units Subcutaneous BID  . iron polysaccharides  150 mg Oral BID  . metoCLOPramide  10 mg Oral TID  . mometasone-formoterol  2 puff Inhalation BID  . multivitamin with minerals  1 tablet Oral Daily  . pantoprazole  40 mg Oral Daily  . rifaximin  550 mg Oral BID    Continuous Infusions: . sodium chloride 10 mL/hr at 09/06/13 0908    Molli Barrows, RD, LDN, [place] Pager [phone] After Hours Pager [phone]

## 2013-09-08 NOTE — Evaluation (Signed)
Physical Therapy Evaluation Patient Details Name: [name] MRN: [id number] DOB: July 30, 1950 Today's Date: 09/08/2013   History of Present Illness  63 year old female patient with multiple medical problems including non-alcoholic cirrhosis, known squamous cell lung cancer, chronic diastolic dysfunction, and chronic respiratory failure due to oxygen dependent COPD. She presented to the hospital because of chest pain and shortness of breath. Based on her home scale she had gained 5 pounds. After notifying her physician Lasix dosage was increased for one week. Unfortunately after completing this dosage regimen her symptoms persisted. Since that time she has noted progressive shortness of breath, increasing lower extremity edema, chest pressure and dyspnea on exertion. Upon her home health nurse evaluation on the day of presentation her blood pressure was low at 100/50.  Clinical Impression  Pt doing very well overall for mobility.  Ambulates at very slow gait speed, however is very steady with use of RW and is able to keep sats in 90's on 2LO2 throughout all mobility.  Discussed stairs, however pt felt that she was comfortable with stairs and did not need to practice and was able to verbalize how to perform with use of RW.  Feel she will benefit from continued acute services to address deficits and also recommend HHPT for follow up.                                Follow Up Recommendations Home health PT    Equipment Recommendations  None recommended by PT    Recommendations for Other Services       Precautions / Restrictions Precautions Precautions: Fall Precaution Comments: O2 dependent, new pacemaker placement Restrictions Weight Bearing Restrictions: No      Mobility  Bed Mobility               General bed mobility comments: Pt in recliner when PT arrived.   Transfers Overall transfer level: Needs assistance Equipment used: Rolling walker (2 wheeled) Transfers: Sit  to/from Stand Sit to Stand: Supervision         General transfer comment: S for safety with min cues for hand placement and decreasing use of LUE from pacemaker placement restrictions (decreased WB through UEs)  Ambulation/Gait Ambulation/Gait assistance: Supervision;Modified independent (Device/Increase time) Ambulation Distance (Feet): 150 Feet Assistive device: Rolling walker (2 wheeled) Gait Pattern/deviations: Step-through pattern;Decreased stride length     General Gait Details: Pt with very decreased gait speed, however very safe and stable with use of RW.  Ambulated on 2L O2 with SaO2 remaining in 90's throughout.    Stairs Stairs:  (Discussed but did not perform)          Wheelchair Mobility    Modified Rankin (Stroke Patients Only)       Balance Overall balance assessment: Needs assistance Sitting-balance support: Feet supported;No upper extremity supported Sitting balance-Leahy Scale: Good     Standing balance support: During functional activity Standing balance-Leahy Scale: Fair                               Pertinent Vitals/Pain Pain Assessment: 0-10 Pain Score: 6  Pain Descriptors / Indicators: Aching Pain Intervention(s): Monitored during session (states not time for pain meds yet)    Home Living Family/patient expects to be discharged to:: Private residence Living Arrangements: Children Available Help at Discharge: Available 24 hours/day Type of Home: House Home Access: Stairs to enter Entrance  Stairs-Rails: None Entrance Stairs-Number of Steps: 3 Home Layout: One level Home Equipment: Cane - single point;Walker - 2 wheels;Bedside commode;Tub bench      Prior Function Level of Independence: Needs assistance      ADL's / Homemaking Assistance Needed: pts granddaughter assists with LE bathing and dressing, meal prep and house keeping        Hand Dominance        Extremity/Trunk Assessment               Lower  Extremity Assessment: Overall WFL for tasks assessed      Cervical / Trunk Assessment: Normal  Communication   Communication: No difficulties  Cognition Arousal/Alertness: Awake/alert Behavior During Therapy: WFL for tasks assessed/performed Overall Cognitive Status: Within Functional Limits for tasks assessed                      General Comments      Exercises        Assessment/Plan    PT Assessment Patient needs continued PT services  PT Diagnosis Generalized weakness;Difficulty walking   PT Problem List Decreased strength;Decreased activity tolerance;Decreased balance;Decreased mobility;Cardiopulmonary status limiting activity  PT Treatment Interventions DME instruction;Gait training;Stair training;Functional mobility training;Balance training;Therapeutic activities;Therapeutic exercise;Patient/family education   PT Goals (Current goals can be found in the Care Plan section) Acute Rehab PT Goals Patient Stated Goal: to return home PT Goal Formulation: With patient Time For Goal Achievement: 09/15/13 Potential to Achieve Goals: Good    Frequency Min 2X/week   Barriers to discharge        Co-evaluation               End of Session Equipment Utilized During Treatment: Oxygen Activity Tolerance: Patient tolerated treatment well Patient left: in chair;with call bell/phone within reach;with family/visitor present Nurse Communication: Mobility status         Time: 0712-1975 PT Time Calculation (min): 27 min   Charges:   PT Evaluation $Initial PT Evaluation Tier I: 1 Procedure PT Treatments $Gait Training: 23-37 mins   PT G Codes:          Denice Bors 09/08/2013, 12:12 PM

## 2013-09-08 NOTE — Progress Notes (Signed)
Patient ID: [name], female   DOB: [birth year], 63 y.o.   MRN: [id number]   Patient Name: [name] Date of Encounter: 09/08/2013     Active Problems:   Type II or unspecified type diabetes mellitus with neurological manifestations, uncontrolled   HYPERTENSION   COPD   GERD   Chronic respiratory failure   Cirrhosis of liver not due to alcohol   AKI (acute kidney injury)   Left patella fracture   Hyponatremia   Chronic diastolic heart failure   Chest pain   Isorhythmic AV dissociation   Hypotension   Acute on chronic diastolic heart failure   Bradycardia    SUBJECTIVE  "I feel better", denies chest pain or sob.  CURRENT MEDS . antiseptic oral rinse  7 mL Mouth Rinse BID  . dicyclomine  10 mg Oral TID  . feeding supplement (PRO-STAT SUGAR FREE 64)  30 mL Oral BID WC  . folic acid  1 mg Oral QPM  . furosemide  80 mg Intravenous Q12H  . furosemide  80 mg Oral BID  . insulin aspart  0-5 Units Subcutaneous QHS  . insulin aspart  0-9 Units Subcutaneous TID WC  . insulin glargine  10 Units Subcutaneous BID  . iron polysaccharides  150 mg Oral BID  . metoCLOPramide  10 mg Oral TID  . mometasone-formoterol  2 puff Inhalation BID  . multivitamin with minerals  1 tablet Oral Daily  . pantoprazole  40 mg Oral Daily  . rifaximin  550 mg Oral BID    OBJECTIVE  Filed Vitals:   09/07/13 2054 09/07/13 2100 09/08/13 0447 09/08/13 0904  BP:  126/43 117/39   Pulse:  62 66   Temp:  98.7 F (37.1 C) 99.1 F (37.3 C)   TempSrc:  Oral Oral   Resp:  18 18   Height:      Weight:   190 lb 3.2 oz (86.274 kg)   SpO2: 99%  98% 99%    Intake/Output Summary (Last 24 hours) at 09/08/13 0923 Last data filed at 09/08/13 0500  Gross per 24 hour  Intake    240 ml  Output   5951 ml  Net  -5711 ml   Filed Weights   09/05/13 0022 09/07/13 0300 09/08/13 0447  Weight: 191 lb (86.637 kg) 202 lb (91.627 kg) 190 lb 3.2 oz (86.274 kg)    PHYSICAL EXAM  General: Pleasant,  NAD. Neuro: Alert and oriented X 3. Moves all extremities spontaneously. Psych: Normal affect. HEENT:  Normal  Neck: Supple without bruits or JVD. Lungs:  Resp regular and unlabored, CTA. No hematoma. Heart: RRR no s3, s4, or murmurs. Abdomen: Soft, non-tender, non-distended, BS + x 4.  Extremities: No clubbing, cyanosis or edema. DP/PT/Radials 2+ and equal bilaterally.  Accessory Clinical Findings  CBC No results found for this basename: WBC, NEUTROABS, HGB, HCT, MCV, PLT,  in the last 72 hours Basic Metabolic Panel  Recent Labs  09/07/13 0223 09/08/13 0323  NA 121* 132*  K 5.1 4.3  CL 84* 91*  CO2 23 29  GLUCOSE 144* 104*  BUN 55* 49*  CREATININE 1.51* 1.29*  CALCIUM 9.2 9.5  MG  --  1.9   Liver Function Tests No results found for this basename: AST, ALT, ALKPHOS, BILITOT, PROT, ALBUMIN,  in the last 72 hours No results found for this basename: LIPASE, AMYLASE,  in the last 72 hours Cardiac Enzymes No results found for this basename: CKTOTAL, CKMB, CKMBINDEX, TROPONINI,  in the last 72 hours BNP No components found with this basename: POCBNP,  D-Dimer No results found for this basename: DDIMER,  in the last 72 hours Hemoglobin A1C No results found for this basename: HGBA1C,  in the last 72 hours Fasting Lipid Panel No results found for this basename: CHOL, HDL, LDLCALC, TRIG, CHOLHDL, LDLDIRECT,  in the last 72 hours Thyroid Function Tests  Recent Labs  09/06/13 0240  TSH 1.130    TELE nsr with atrial pacing   Radiology/Studies  Dg Chest 2 View  09/08/2013   CLINICAL DATA:  Post pacemaker with some chest pain  EXAM: CHEST  2 VIEW  COMPARISON:  09/02/2013  FINDINGS: Duo lead pacer has been placed with generator over left thorax. No pneumothorax or effusion. Stable mild left base atelectasis or scarring.  Stable mild cardiac enlargement with postoperative change in the left hilum.  IMPRESSION: No active cardiopulmonary disease.   Electronically Signed   By:  Skipper Cliche M.D.   On: 09/08/2013 08:07   Dg Chest 2 View  09/02/2013   CLINICAL DATA:  Chest pain  EXAM: CHEST  2 VIEW  COMPARISON:  07/12/2013  FINDINGS: Stable cardiomegaly with chronic vascular and interstitial prominence. Postop changes in left hilum/mediastinum. Parenchymal scarring in the left lower lobe. No new superimposed pneumonia, collapse or consolidation. No effusion or pneumothorax. Trachea midline. Prior cholecystectomy noted.  IMPRESSION: Stable cardiomegaly with vascular congestion and postop findings.   Electronically Signed   By: Daryll Brod M.D.   On: 09/02/2013 19:05    ASSESSMENT AND PLAN 1. Symptomatic sinus node dysfunction 2. S/p PPM with normal device function. 3. DM 4. CAD Rec: ok from my perspective for discharge home. She should feel much better. Our office will arrange followup.  Gregg Taylor,M.D.  Gregg Taylor,M.D.  09/08/2013 9:23 AM

## 2013-09-09 ENCOUNTER — Telehealth: Payer: Self-pay | Admitting: Family Medicine

## 2013-09-09 DIAGNOSIS — E1149 Type 2 diabetes mellitus with other diabetic neurological complication: Secondary | ICD-10-CM

## 2013-09-09 NOTE — Telephone Encounter (Signed)
Physical therapist Rose Fillers calling to resume physcial therapy for pt. Pt rushed to ed. Pacemaker fitted. And pt is home. They were treating.  Verbal will be good to resume. Pt has a wound on left knee from patela fracture in April and a wound from surgery from pacemaker. Also want orders to resume nursing orders for wound care w/ [facility]

## 2013-09-10 ENCOUNTER — Telehealth: Payer: Self-pay | Admitting: Internal Medicine

## 2013-09-10 NOTE — Telephone Encounter (Signed)
F/u    EMS is currently at the pt's home and waiting for nurse to call back concerning pt. Please call pt.

## 2013-09-10 NOTE — Telephone Encounter (Signed)
Follow Up    Calling in returning call from earlier. Please call back.

## 2013-09-10 NOTE — Telephone Encounter (Signed)
FYI only.

## 2013-09-10 NOTE — Telephone Encounter (Signed)
F/u    Pt's daughter returning a call from [place]. Please call pt

## 2013-09-10 NOTE — Telephone Encounter (Signed)
Left message for Esmond Camper to call back regarding patient

## 2013-09-10 NOTE — Telephone Encounter (Signed)
Spoke with patient's daughter who states she was advised to check patient's BP twice daily.  Daughter states today she checked patient's BP and was alarmed at how low one of the readings was. Daughter states EMS personnel advised their evaluation of patient's BP was 130/20 Rt arm and 132/52 Lt arm.  They advised that cardiologist be notified of the variation in readings.  Daughter states patient has no complaints; states she denies fatigue, dizziness, light-headedness or syncope. I reviewed patient's chart and it appears that typical BP is 681'E systolic and 75'T diastolic.  I advised patient's daughter to make certain patient is staying well-hydrated by drinking water and to continue current treatment and to keep f/u appointment with Dr. Lovena Le on 8/24.  I am sending message to Dr. Lovena Le and his primary nurse, Janan Halter, RN for additional advice and so they will be aware.  Daughter verbalized understanding and agreement.

## 2013-09-10 NOTE — Telephone Encounter (Signed)
Left message for call back.

## 2013-09-10 NOTE — Telephone Encounter (Signed)
Per Dr. Elease Hashimoto, okay to give verbal order for nurse to evaluate over the weekend and give wound care. I spoke with Roxy Manns from [place] and gave this verbal order, also Roxy Manns will fax over paperwork after the assessment to [name].

## 2013-09-10 NOTE — Telephone Encounter (Signed)
New [facility]   Pt had pacemaker placed 8/11. States pts BP was low this morning. Daughter is worried and requesting to speak to a nurse. Please call.

## 2013-09-16 ENCOUNTER — Other Ambulatory Visit: Payer: Self-pay | Admitting: *Deleted

## 2013-09-16 ENCOUNTER — Ambulatory Visit (HOSPITAL_COMMUNITY)
Admission: RE | Admit: 2013-09-16 | Discharge: 2013-09-16 | Disposition: A | Discharge status: Home / Self Care | Payer: Medicare Other | Source: Ambulatory Visit | Attending: Internal Medicine | Admitting: Internal Medicine

## 2013-09-16 ENCOUNTER — Ambulatory Visit: Payer: Medicare Other

## 2013-09-16 ENCOUNTER — Other Ambulatory Visit: Payer: Self-pay | Admitting: Medical Oncology

## 2013-09-16 ENCOUNTER — Other Ambulatory Visit (HOSPITAL_BASED_OUTPATIENT_CLINIC_OR_DEPARTMENT_OTHER): Payer: Medicare Other

## 2013-09-16 DIAGNOSIS — D649 Anemia, unspecified: Secondary | ICD-10-CM | POA: Insufficient documentation

## 2013-09-16 DIAGNOSIS — C3490 Malignant neoplasm of unspecified part of unspecified bronchus or lung: Secondary | ICD-10-CM

## 2013-09-16 LAB — CBC WITH DIFFERENTIAL/PLATELET
BASO%: 1.1 % (ref 0.0–2.0)
Basophils Absolute: 0.1 10*3/uL (ref 0.0–0.1)
EOS%: 11.5 % — ABNORMAL HIGH (ref 0.0–7.0)
Eosinophils Absolute: 1.5 10*3/uL — ABNORMAL HIGH (ref 0.0–0.5)
HEMATOCRIT: 21.5 % — AB (ref 34.8–46.6)
HGB: 6.9 g/dL — CL (ref 11.6–15.9)
LYMPH#: 2.7 10*3/uL (ref 0.9–3.3)
LYMPH%: 21.4 % (ref 14.0–49.7)
MCH: 26.5 pg (ref 25.1–34.0)
MCHC: 32.1 g/dL (ref 31.5–36.0)
MCV: 82.7 fL (ref 79.5–101.0)
MONO#: 2.6 10*3/uL — ABNORMAL HIGH (ref 0.1–0.9)
MONO%: 20.4 % — ABNORMAL HIGH (ref 0.0–14.0)
NEUT#: 5.8 10*3/uL (ref 1.5–6.5)
NEUT%: 45.6 % (ref 38.4–76.8)
NRBC: 0 % (ref 0–0)
Platelets: 426 10*3/uL — ABNORMAL HIGH (ref 145–400)
RBC: 2.6 10*6/uL — AB (ref 3.70–5.45)
RDW: 17.3 % — ABNORMAL HIGH (ref 11.2–14.5)
WBC: 12.6 10*3/uL — AB (ref 3.9–10.3)

## 2013-09-16 LAB — IRON AND TIBC CHCC
%SAT: 4 % — ABNORMAL LOW (ref 21–57)
IRON: 15 ug/dL — AB (ref 41–142)
TIBC: 413 ug/dL (ref 236–444)
UIBC: 398 ug/dL — ABNORMAL HIGH (ref 120–384)

## 2013-09-16 LAB — FERRITIN CHCC: Ferritin: 41 ng/ml (ref 9–269)

## 2013-09-16 LAB — HOLD TUBE, BLOOD BANK

## 2013-09-16 LAB — TECHNOLOGIST REVIEW

## 2013-09-16 NOTE — Progress Notes (Signed)
Har done an dpt notified of appt for blood transfusion Friday 09/17/13

## 2013-09-17 ENCOUNTER — Ambulatory Visit (HOSPITAL_BASED_OUTPATIENT_CLINIC_OR_DEPARTMENT_OTHER): Payer: Medicare Other

## 2013-09-17 ENCOUNTER — Telehealth: Payer: Self-pay | Admitting: Family Medicine

## 2013-09-17 VITALS — BP 174/67 | HR 70 | Temp 98.9°F | Resp 18

## 2013-09-17 DIAGNOSIS — D649 Anemia, unspecified: Secondary | ICD-10-CM | POA: Diagnosis not present

## 2013-09-17 LAB — PREPARE RBC (CROSSMATCH)

## 2013-09-17 MED ORDER — SODIUM CHLORIDE 0.9 % IV SOLN
250.0000 mL | Freq: Once | INTRAVENOUS | Status: AC
  Administered 2013-09-17: 250 mL via INTRAVENOUS

## 2013-09-17 MED ORDER — DIPHENHYDRAMINE HCL 25 MG PO CAPS
ORAL_CAPSULE | ORAL | Status: AC
  Filled 2013-09-17: qty 1

## 2013-09-17 MED ORDER — DIPHENHYDRAMINE HCL 25 MG PO CAPS
25.0000 mg | ORAL_CAPSULE | Freq: Once | ORAL | Status: AC
  Administered 2013-09-17: 25 mg via ORAL

## 2013-09-17 NOTE — Patient Instructions (Signed)

## 2013-09-17 NOTE — Telephone Encounter (Signed)
latonya physical therapy assistant is calling to let md know pt bp  Today is 118/40 and pt had pacemaker put in last week. Pt wrist bp machine is reading 114/43. Pt will be getting blood transfusion today 2 pints pt stated her hemoglobin was 6 . Please advise

## 2013-09-17 NOTE — Telephone Encounter (Signed)
Left detailed message on machine for [facility]

## 2013-09-17 NOTE — Telephone Encounter (Signed)
She should report this to her Cardiologist, Dr. Lovena Le

## 2013-09-18 LAB — TYPE AND SCREEN
ABO/RH(D): A POS
Antibody Screen: NEGATIVE
Unit division: 0
Unit division: 0

## 2013-09-20 ENCOUNTER — Ambulatory Visit (INDEPENDENT_AMBULATORY_CARE_PROVIDER_SITE_OTHER): Payer: Medicare Other | Admitting: *Deleted

## 2013-09-20 DIAGNOSIS — I4430 Unspecified atrioventricular block: Secondary | ICD-10-CM

## 2013-09-20 DIAGNOSIS — R001 Bradycardia, unspecified: Secondary | ICD-10-CM

## 2013-09-20 DIAGNOSIS — I498 Other specified cardiac arrhythmias: Secondary | ICD-10-CM

## 2013-09-20 LAB — MDC_IDC_ENUM_SESS_TYPE_INCLINIC
Battery Remaining Longevity: 104.4 mo
Battery Voltage: 3.04 V
Brady Statistic RA Percent Paced: 64 %
Date Time Interrogation Session: 20150824142519
Implantable Pulse Generator Model: 2240
Implantable Pulse Generator Serial Number: 7648211
Lead Channel Impedance Value: 562.5 Ohm
Lead Channel Pacing Threshold Amplitude: 0.5 V
Lead Channel Pacing Threshold Amplitude: 0.75 V
Lead Channel Pacing Threshold Amplitude: 0.75 V
Lead Channel Pacing Threshold Pulse Width: 0.4 ms
Lead Channel Pacing Threshold Pulse Width: 0.4 ms
Lead Channel Setting Pacing Amplitude: 3.5 V
Lead Channel Setting Sensing Sensitivity: 2 mV
MDC IDC MSMT LEADCHNL RA IMPEDANCE VALUE: 537.5 Ohm
MDC IDC MSMT LEADCHNL RA PACING THRESHOLD PULSEWIDTH: 0.4 ms
MDC IDC MSMT LEADCHNL RA SENSING INTR AMPL: 2.8 mV
MDC IDC MSMT LEADCHNL RV PACING THRESHOLD AMPLITUDE: 0.5 V
MDC IDC MSMT LEADCHNL RV PACING THRESHOLD PULSEWIDTH: 0.4 ms
MDC IDC MSMT LEADCHNL RV SENSING INTR AMPL: 12 mV
MDC IDC SET LEADCHNL RA PACING AMPLITUDE: 3.5 V
MDC IDC SET LEADCHNL RV PACING PULSEWIDTH: 0.4 ms
MDC IDC STAT BRADY RV PERCENT PACED: 0.51 %

## 2013-09-20 NOTE — Progress Notes (Signed)

## 2013-09-23 ENCOUNTER — Ambulatory Visit (HOSPITAL_BASED_OUTPATIENT_CLINIC_OR_DEPARTMENT_OTHER): Payer: Medicare Other | Admitting: Internal Medicine

## 2013-09-23 ENCOUNTER — Telehealth: Payer: Self-pay | Admitting: Internal Medicine

## 2013-09-23 ENCOUNTER — Other Ambulatory Visit: Payer: Self-pay | Admitting: Internal Medicine

## 2013-09-23 ENCOUNTER — Encounter: Payer: Self-pay | Admitting: Internal Medicine

## 2013-09-23 ENCOUNTER — Ambulatory Visit (HOSPITAL_BASED_OUTPATIENT_CLINIC_OR_DEPARTMENT_OTHER): Payer: Medicare Other

## 2013-09-23 VITALS — BP 119/40 | HR 60 | Temp 98.8°F | Resp 20 | Ht 60.0 in | Wt 185.1 lb

## 2013-09-23 DIAGNOSIS — C3410 Malignant neoplasm of upper lobe, unspecified bronchus or lung: Secondary | ICD-10-CM

## 2013-09-23 DIAGNOSIS — D509 Iron deficiency anemia, unspecified: Secondary | ICD-10-CM

## 2013-09-23 DIAGNOSIS — R5381 Other malaise: Secondary | ICD-10-CM

## 2013-09-23 DIAGNOSIS — R0989 Other specified symptoms and signs involving the circulatory and respiratory systems: Secondary | ICD-10-CM

## 2013-09-23 DIAGNOSIS — R0609 Other forms of dyspnea: Secondary | ICD-10-CM

## 2013-09-23 DIAGNOSIS — D500 Iron deficiency anemia secondary to blood loss (chronic): Secondary | ICD-10-CM

## 2013-09-23 DIAGNOSIS — R5383 Other fatigue: Secondary | ICD-10-CM

## 2013-09-23 LAB — CBC WITH DIFFERENTIAL/PLATELET
BASO%: 1.4 % (ref 0.0–2.0)
Basophils Absolute: 0.2 10*3/uL — ABNORMAL HIGH (ref 0.0–0.1)
EOS ABS: 1.1 10*3/uL — AB (ref 0.0–0.5)
EOS%: 8.9 % — ABNORMAL HIGH (ref 0.0–7.0)
HCT: 29.7 % — ABNORMAL LOW (ref 34.8–46.6)
HGB: 9.7 g/dL — ABNORMAL LOW (ref 11.6–15.9)
LYMPH%: 21 % (ref 14.0–49.7)
MCH: 27.5 pg (ref 25.1–34.0)
MCHC: 32.6 g/dL (ref 31.5–36.0)
MCV: 84.4 fL (ref 79.5–101.0)
MONO#: 2.7 10*3/uL — AB (ref 0.1–0.9)
MONO%: 21.9 % — AB (ref 0.0–14.0)
NEUT%: 46.8 % (ref 38.4–76.8)
NEUTROS ABS: 5.8 10*3/uL (ref 1.5–6.5)
Platelets: 426 10*3/uL — ABNORMAL HIGH (ref 145–400)
RBC: 3.52 10*6/uL — AB (ref 3.70–5.45)
RDW: 18.6 % — AB (ref 11.2–14.5)
WBC: 12.3 10*3/uL — AB (ref 3.9–10.3)
lymph#: 2.6 10*3/uL (ref 0.9–3.3)

## 2013-09-23 LAB — FERRITIN CHCC: FERRITIN: 52 ng/mL (ref 9–269)

## 2013-09-23 LAB — IRON AND TIBC CHCC
%SAT: 8 % — ABNORMAL LOW (ref 21–57)
IRON: 35 ug/dL — AB (ref 41–142)
TIBC: 413 ug/dL (ref 236–444)
UIBC: 378 ug/dL (ref 120–384)

## 2013-09-23 LAB — TECHNOLOGIST REVIEW

## 2013-09-23 NOTE — Telephone Encounter (Signed)
gv and printed appt sched and avs for pt for NOV...sent pt to lab

## 2013-09-23 NOTE — Telephone Encounter (Signed)
Left message on machine for patient to schedule a lab appointment to LDL Diabetic bundle Lipid ordered

## 2013-09-23 NOTE — Progress Notes (Signed)
Quick Note:  Call patient with the result. She has iron deficiency. Will arrange feraheme on 8/28 and next week ______

## 2013-09-23 NOTE — Progress Notes (Signed)
[place] [phone]   [phone]  OFFICE PROGRESS NOTE  [name], MD [place] [address]  DIAGNOSIS:  1) stage IB non-small cell lung cancer diagnosed in April 2004 status post left upper lobectomy by Dr. Roxan Hockey.  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  3) anemia of chronic disease plus/minus deficiency secondary to GI bleed.  4) hypercalcemia most likely secondary to excessive vitamin D intake.  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 Ninfa Linden.  CURRENT THERAPY:  Observation.  INTERVAL HISTORY: [name] 63 y.o. female returns to the clinic today for followup visit. She continues to complain of persistent fatigue and bleeding from the colostomy bag.  She continues to have increasing fatigue. She also has intermittent bleeding from the ostomy. She is currently taking  oral iron but not affect. She denied having any other significant complaints except for the fatigue. She denied and specifically no nausea or vomiting, no fever or chills. She denied having any significant weight loss or night sweats. She has no chest pain but continues to have shortness of breath with exertion. She has no other bleeding, bruises or ecchymosis. She had repeat CBC and iron study performed earlier today and she is here for evaluation and discussion of her lab results.  Her recent CBC few days ago showed the open her hemoglobin to 6.9. The patient received 2 units of PRBCs transfusion at the cancer center.  MEDICAL HISTORY: Past Medical History  Diagnosis Date  . Cirrhosis   . GERD (gastroesophageal reflux disease)   . Cervical disc syndrome   . Lung cancer     squamous cell  . Chronic respiratory failure   . Diverticulitis of colon   . Perforation of colon   . Arthritis   . IBS (irritable bowel syndrome)   . Chronic lower GI bleeding   . Overactive bladder     . Cervical cancer   . Thrombocytopenia     sees Dr. Julien Nordmann   . Splenomegaly   . Depression   . Hypertension   . Asthma   . Chronic headache disorder   . Blood transfusion without reported diagnosis   . Heart murmur   . Hyperlipidemia   . COPD (chronic obstructive pulmonary disease)     sees Dr. Gwenette Greet   . Anemia     sees Dr. Julien Nordmann, due to chronic disease and GI losses   . Diabetes mellitus     sees Dr. Cruzita Lederer   . Colostomy care   . Hypercalcemia     ALLERGIES:  is allergic to acetaminophen; morphine; other; penicillins; codeine phosphate; hydrocodone-acetaminophen; trazodone and nefazodone; cephalexin; and hydrocodone-acetaminophen.  MEDICATIONS:  Current Outpatient Prescriptions  Medication Sig Dispense Refill  . albuterol-ipratropium (COMBIVENT) 18-103 MCG/ACT inhaler Inhale 2 puffs into the lungs every 4 (four) hours as needed for wheezing or shortness of breath.       . diazepam (VALIUM) 5 MG tablet Take 1 tablet (5 mg total) by mouth 3 (three) times daily as needed for anxiety.  90 tablet  5  . dicyclomine (BENTYL) 10 MG capsule Take 10 mg by mouth 3 (three) times daily.       [name] esomeprazole (NEXIUM) 40 MG capsule Take 40 mg by mouth every evening.      . Fluticasone-Salmeterol (ADVAIR) 250-50 MCG/DOSE AEPB Inhale 1 puff into the lungs every 12 (twelve) hours.  180 each  3  .  folic acid (FOLVITE) 1 MG tablet Take 1 tablet (1 mg total) by mouth every evening.  90 tablet  3  . furosemide (LASIX) 40 MG tablet Take 80 mg by mouth 2 (two) times daily.      . hydrALAZINE (APRESOLINE) 25 MG tablet Take 1.5 tablets (37.5 mg total) by mouth 2 (two) times daily.  90 tablet  3  . Insulin Glargine (LANTUS SOLOSTAR) 100 UNIT/ML Solostar Pen Inject 40 Units into the skin 2 (two) times daily.       . iron polysaccharides (NIFEREX) 150 MG capsule Take 1 capsule (150 mg total) by mouth 2 (two) times daily.  60 capsule  1  . isosorbide dinitrate (ISORDIL) 20 MG tablet Take 1 tablet (20  mg total) by mouth 2 (two) times daily.  60 tablet  3  . losartan (COZAAR) 50 MG tablet Take 1 tablet (50 mg total) by mouth daily.  30 tablet  1  . methocarbamol (ROBAXIN) 500 MG tablet Take 1 tablet (500 mg total) by mouth every 6 (six) hours as needed for muscle spasms.  60 tablet  0  . metoCLOPramide (REGLAN) 10 MG tablet Take 1 tablet (10 mg total) by mouth 3 (three) times daily.  270 tablet  3  . Oxycodone HCl 20 MG TABS Take 1 tablet (20 mg total) by mouth every 3 (three) hours as needed (pain).  240 tablet  0  . promethazine (PHENERGAN) 25 MG tablet Take 25 mg by mouth every 6 (six) hours as needed for nausea or vomiting.       . rifaximin (XIFAXAN) 550 MG TABS tablet Take 550 mg by mouth 2 (two) times daily.       No current facility-administered medications for this visit.    SURGICAL HISTORY:  Past Surgical History  Procedure Laterality Date  . Appendectomy    . Cholecystectomy    . Colostomy    . Pneumonectomy    . Cervix removed    . Bowel resection    . Esophagogastroduodenoscopy  03/19/2011    Procedure: ESOPHAGOGASTRODUODENOSCOPY (EGD);  Surgeon: [name], MD;  Location: [facility];  Service: Endoscopy;  Laterality: N/A;  . Colonoscopy  03/19/2011    Procedure: COLONOSCOPY;  Surgeon: [name], MD;  Location: [facility];  Service: Endoscopy;  Laterality: N/A;  . Givens capsule study  03/20/2011    Procedure: GIVENS CAPSULE STUDY;  Surgeon: [name], MD;  Location: [facility];  Service: Endoscopy;  Laterality: N/A;  . Small bowel obstruction repair  March 2012  . Umbilical hernia repair  March 2012  . Splenectomy, total N/A 02/24/2013    Procedure: SPLENECTOMY;  Surgeon: Harl Bowie, MD;  Location: [facility];  Service: General;  Laterality: N/A;  . Orif patella fracture Left 04/21/2013    DR [name]  . Orif patella Left 04/21/2013    Procedure: OPEN REDUCTION INTERNAL (ORIF) FIXATION PATELLA;  Surgeon: Augustin Schooling, MD;  Location: [facility];   Service: Orthopedics;  Laterality: Left;    REVIEW OF SYSTEMS:  Constitutional: positive for fatigue Eyes: negative Ears, nose, mouth, throat, and face: negative Respiratory: positive for dyspnea on exertion Cardiovascular: negative Gastrointestinal: positive for Bleeding from the ostomy site. Genitourinary:negative Integument/breast: negative Hematologic/lymphatic: Anemia and low platelets Musculoskeletal:negative Neurological: negative Behavioral/Psych: negative Allergic/Immunologic: negative   PHYSICAL EXAMINATION: General appearance: alert, cooperative, fatigued and no distress Head: Normocephalic, without obvious abnormality, atraumatic Neck: no adenopathy and no JVD Lymph nodes: Cervical, supraclavicular, and axillary nodes normal. Resp:  clear to auscultation bilaterally Cardio: regular rate and rhythm, S1, S2 normal, no murmur, click, rub or gallop GI: soft, non-tender; bowel sounds normal; no masses,  no organomegaly Extremities: extremities normal, atraumatic, no cyanosis or edema Neurologic: Alert and oriented X 3, normal strength and tone. Normal symmetric reflexes. Normal coordination and gait  ECOG PERFORMANCE STATUS: 2 - Symptomatic, <50% confined to bed  Blood pressure 119/40, pulse 60, temperature 98.8 F (37.1 C), temperature source Oral, resp. rate 20, height 5' (1.524 m), weight 185 lb 1.6 oz (83.961 kg), SpO2 98.00%.  LABORATORY DATA: Lab Results  Component Value Date   WBC 12.6* 09/16/2013   HGB 6.9* 09/16/2013   HCT 21.5* 09/16/2013   MCV 82.7 09/16/2013   PLT 426* 09/16/2013      Chemistry      Component Value Date/Time   NA 132* 09/08/2013 0323   NA 138 06/23/2013 0806   K 4.3 09/08/2013 0323   K 4.7 06/23/2013 0806   CL 91* 09/08/2013 0323   CL 103 03/23/2012 0938   CO2 29 09/08/2013 0323   CO2 21* 06/23/2013 0806   BUN 49* 09/08/2013 0323   BUN 21.7 06/23/2013 0806   CREATININE 1.29* 09/08/2013 0323   CREATININE 1.1 06/23/2013 0806      Component  Value Date/Time   CALCIUM 9.5 09/08/2013 0323   CALCIUM 10.3 06/23/2013 0806   ALKPHOS 86 09/05/2013 0245   ALKPHOS 138 06/23/2013 0806   AST 23 09/05/2013 0245   AST 43* 06/23/2013 0806   ALT 16 09/05/2013 0245   ALT 49 06/23/2013 0806   BILITOT <0.2* 09/05/2013 0245   BILITOT 0.31 06/23/2013 0806       RADIOGRAPHIC STUDIES: BONE MARROW REPORT ASSESSMENT AND PLAN:  1) severe iron deficiency anemia secondary to gastrointestinal blood loss from the ostomy. The patient is currently on oral iron tablets but it is not effective. I will arrange for the patient to have Feraheme infusion 510 mg weekly x2 doses. I would see her back for follow up visit in 3 months after repeating CBC and iron study. She'll also continue her routine followup visit with her gastroenterologist for evaluation of her persistent GI bleed.  2) thrombocytopenia: Her thrombocytopenia has Completely resolved after the splenectomy.   3) history of stage IB non-small cell lung cancer diagnosed in 2014. Continue on observation.  She was advised to call immediately if she has any concerning symptoms in the interval. The patient voices understanding of current disease status and treatment options and is in agreement with the current care plan.  All questions were answered. The patient knows to call the clinic with any problems, questions or concerns. We can certainly see the patient much sooner if necessary.  Disclaimer: This note was dictated with voice recognition software. Similar sounding words can inadvertently be transcribed and may not be corrected upon review.

## 2013-09-24 ENCOUNTER — Ambulatory Visit (HOSPITAL_BASED_OUTPATIENT_CLINIC_OR_DEPARTMENT_OTHER): Payer: Medicare Other

## 2013-09-24 ENCOUNTER — Telehealth: Payer: Self-pay | Admitting: Medical Oncology

## 2013-09-24 DIAGNOSIS — D509 Iron deficiency anemia, unspecified: Secondary | ICD-10-CM

## 2013-09-24 MED ORDER — SODIUM CHLORIDE 0.9 % IV SOLN
Freq: Once | INTRAVENOUS | Status: AC
  Administered 2013-09-24: 15:00:00 via INTRAVENOUS

## 2013-09-24 MED ORDER — SODIUM CHLORIDE 0.9 % IJ SOLN
10.0000 mL | INTRAMUSCULAR | Status: DC | PRN
  Filled 2013-09-24: qty 10

## 2013-09-24 MED ORDER — SODIUM CHLORIDE 0.9 % IV SOLN
510.0000 mg | Freq: Once | INTRAVENOUS | Status: AC
  Administered 2013-09-24: 510 mg via INTRAVENOUS
  Filled 2013-09-24: qty 17

## 2013-09-24 MED ORDER — HEPARIN SOD (PORK) LOCK FLUSH 100 UNIT/ML IV SOLN
500.0000 [IU] | Freq: Once | INTRAVENOUS | Status: DC | PRN
  Filled 2013-09-24: qty 5

## 2013-09-24 MED ORDER — ALTEPLASE 2 MG IJ SOLR
2.0000 mg | Freq: Once | INTRAMUSCULAR | Status: DC | PRN
  Filled 2013-09-24: qty 2

## 2013-09-24 MED ORDER — SODIUM CHLORIDE 0.9 % IJ SOLN
3.0000 mL | Freq: Once | INTRAMUSCULAR | Status: DC | PRN
  Filled 2013-09-24: qty 10

## 2013-09-24 MED ORDER — HEPARIN SOD (PORK) LOCK FLUSH 100 UNIT/ML IV SOLN
250.0000 [IU] | Freq: Once | INTRAVENOUS | Status: DC | PRN
  Filled 2013-09-24: qty 5

## 2013-09-24 MED ORDER — FERUMOXYTOL INJECTION 510 MG/17 ML
510.0000 mg | Freq: Once | INTRAVENOUS | Status: DC
  Filled 2013-09-24: qty 17

## 2013-09-24 NOTE — Telephone Encounter (Signed)
Pt.notified

## 2013-09-24 NOTE — Telephone Encounter (Signed)
Message copied by Ardeen Garland on Fri Sep 24, 2013 12:24 PM ------      Message from: Curt Bears      Created: Thu Sep 23, 2013  5:28 PM       Call patient with the result. She has iron deficiency. Will arrange feraheme on 8/28 and next week ------

## 2013-09-24 NOTE — Patient Instructions (Signed)
Anemia, Nonspecific Anemia is a condition in which the concentration of red blood cells or hemoglobin in the blood is below normal. Hemoglobin is a substance in red blood cells that carries oxygen to the tissues of the body. Anemia results in not enough oxygen reaching these tissues.  CAUSES  Common causes of anemia include:   Excessive bleeding. Bleeding may be internal or external. This includes excessive bleeding from periods (in women) or from the intestine.   Poor nutrition.   Chronic kidney, thyroid, and liver disease.  Bone marrow disorders that decrease red blood cell production.  Cancer and treatments for cancer.  HIV, AIDS, and their treatments.  Spleen problems that increase red blood cell destruction.  Blood disorders.  Excess destruction of red blood cells due to infection, medicines, and autoimmune disorders. SIGNS AND SYMPTOMS   Minor weakness.   Dizziness.   Headache.  Palpitations.   Shortness of breath, especially with exercise.   Paleness.  Cold sensitivity.  Indigestion.  Nausea.  Difficulty sleeping.  Difficulty concentrating. Symptoms may occur suddenly or they may develop slowly.  DIAGNOSIS  Additional blood tests are often needed. These help your health care provider determine the best treatment. Your health care provider will check your stool for blood and look for other causes of blood loss.  TREATMENT  Treatment varies depending on the cause of the anemia. Treatment can include:   Supplements of iron, vitamin B12, or folic acid.   Hormone medicines.   A blood transfusion. This may be needed if blood loss is severe.   Hospitalization. This may be needed if there is significant continual blood loss.   Dietary changes.  Spleen removal. HOME CARE INSTRUCTIONS Keep all follow-up appointments. It often takes many weeks to correct anemia, and having your health care provider check on your condition and your response to  treatment is very important. SEEK IMMEDIATE MEDICAL CARE IF:   You develop extreme weakness, shortness of breath, or chest pain.   You become dizzy or have trouble concentrating.  You develop heavy vaginal bleeding.   You develop a rash.   You have bloody or black, tarry stools.   You faint.   You vomit up blood.   You vomit repeatedly.   You have abdominal pain.  You have a fever or persistent symptoms for more than 2-3 days.   You have a fever and your symptoms suddenly get worse.   You are dehydrated.  MAKE SURE YOU:  Understand these instructions.  Will watch your condition.  Will get help right away if you are not doing well or get worse. Document Released: 02/22/2004 Document Revised: 09/16/2012 Document Reviewed: 07/10/2012 ExitCare Patient Information 2015 [employer]. This information is not intended to replace advice given to you by your health care provider. Make sure you discuss any questions you have with your health care provider.  

## 2013-09-27 ENCOUNTER — Telehealth: Payer: Self-pay | Admitting: Gastroenterology

## 2013-09-27 NOTE — Telephone Encounter (Signed)
Patient seen by Dr [name] "years ago". She has had blood in her stool for "2 years and no one can find where it is coming from". She has had iron infusion due to anemia. States her oncologist has recommended she schedule an appointment.Appointment made with J.Zehr 09/30/13.

## 2013-09-29 ENCOUNTER — Ambulatory Visit (INDEPENDENT_AMBULATORY_CARE_PROVIDER_SITE_OTHER): Payer: Medicare Other | Admitting: Internal Medicine

## 2013-09-29 ENCOUNTER — Encounter: Payer: Self-pay | Admitting: Internal Medicine

## 2013-09-29 VITALS — BP 130/62 | HR 65 | Temp 98.6°F | Resp 12 | Wt 185.0 lb

## 2013-09-29 DIAGNOSIS — E1149 Type 2 diabetes mellitus with other diabetic neurological complication: Secondary | ICD-10-CM

## 2013-09-29 DIAGNOSIS — I200 Unstable angina: Secondary | ICD-10-CM

## 2013-09-29 NOTE — Patient Instructions (Signed)
Please return in 1.5 month with your sugar log.  - Stay off Metformin for now - decrease Lantus to 35 units 2x a day - decrease Humalog from 40-35-47 units to 45-35-40 units with B'fast-lunch-dinner, respectively  Please return in 1.5 months with your sugar log.

## 2013-09-29 NOTE — Progress Notes (Signed)
Patient ID: [name], female   DOB: 1950-09-15, 63 y.o.   MRN: [id number]  HPI: [name] is a 63 y.o.-year-old female, returns for f/u for DM2, dx 2004, insulin-dependent since 2006/7 - then off in 2009 - then restarted 08/2012, uncontrolled, with complications (PN, admission for [facility] on 11/14/2012). Last visit 10 mo ago.  She will need to have splenectomy for severe splenomegaly (02/24/2013) >> this was a difficult surgery. Had severe blood loss. Last transfusion was 1 week ago.  She broke her kneecap in 03/2013 >> had surgery. She is now finishing rehab.  She had a pacemaker placed for AVB. She now feels well.  Last hemoglobin A1c was: Lab Results  Component Value Date   HGBA1C 6.8* 07/29/2013   HGBA1C 6.9* 04/22/2013   HGBA1C 8.6* 11/14/2012   Pt is on a regimen of: Metformin 1000 mg po bid >> ran out 1 mo ago - Lantus 40 units 2x a day - pen  - Humalog 40-35-47 units with B-L-D- pens [place] $0 for her insulins.  Pt checks her sugars 4-6x a day and they are much improved: - am: 150-230 >> 101-260 >> 106-322 >>  87-104 - 2h after b'fast: 300-499 >> 311-402 - highest sugars of the day >> 275-377 >> 189-200 - before lunch: 247-480 >> 221-407 >> 182-381 >> 190-200 - 2h after lunch: n/c >> 160 x 1 check >> 246- 302 >> n/c - before dinner: 231-504 >> 83-328 - 130-448 >> 67, 140-150 - bedtime: 280-480 >> 104-435 >> 220-372 >> 104-105 No lows. Lowest sugar was 42, 45; she has hypoglycemia awareness at 70-80. Highest sugar was 300 x2.  - + CKD, last BUN/creatinine:  Lab Results  Component Value Date   BUN 49* 09/08/2013   CREATININE 1.29* 09/08/2013   - last eye exam was in 04/2012. No DR.  - + numbness and tingling in her feet.  She also has a history of Squamous cell lung cancer, hypertension, COPD, GERD, depression, anemia and thrombocytopenia, cirrhosis, IBS. She had a BM Bx since I last saw her >> no malignancy.  I reviewed pt's medications, allergies, PMH, social hx,  family hx and no changes required, except as mentioned above. She stopped Trazodone, changed the Lasix dose, started Hydralazine and Isosorbide.  ROS: Constitutional: no weight gain/loss, + fatigue, no subjective hyperthermia/hypothermia, + poor sleep Eyes: no blurry vision, no xerophthalmia ENT: no sore throat, no nodules palpated in throat, no dysphagia/odynophagia, no hoarseness Cardiovascular: no CP/SOB/palpitations/leg swelling Respiratory: no cough/SOB Gastrointestinal: + N/no V/D/C Musculoskeletal: no muscle/+ joint aches Skin: no rashes Neurological: no tremors/numbness/tingling/dizziness, + HA  PE: BP 130/62  Pulse 65  Temp(Src) 98.6 F (37 C) (Oral)  Resp 12  Wt 185 lb (83.915 kg)  SpO2 95% Wt Readings from Last 3 Encounters:  09/29/13 185 lb (83.915 kg)  09/23/13 185 lb 1.6 oz (83.961 kg)  09/08/13 190 lb 3.2 oz (86.274 kg)   Constitutional: obese, pale, in NAD, on 02 Eyes: PERRLA, EOMI, no exophthalmos ENT: moist mucous membranes, no thyromegaly, no cervical lymphadenopathy Cardiovascular: RRR, No MRG Respiratory: CTA B Gastrointestinal: abdomen soft, NT, ND, BS+ Musculoskeletal: no deformities, strength intact in all 4 Skin: moist, warm  ASSESSMENT: 1. DM2, insulin-dependent, uncontrolled, with complications - PN - h/o HHNK admission  PLAN:  1. Patient with long-standing, recently controlled diabetes, on basal-bolus insulin tx + oral antidiabetic regimen. She is having low CBGs recently. Highest sugars are still before lunch. - I suggested to:  Please return in  1.5 month with your sugar log.  - Stay off Metformin for now - decrease Lantus to 35 units 2x a day - decrease Humalog from 40-35-47 units to 45-35-40 units with B'fast-lunch-dinner, respectively  - continue checking sugars at different times of the day - check 4 times a day, rotating checks - she is doing a good job with this, and I gave her new logs - Return to clinic in 1.5 mo with sugar  log

## 2013-09-30 ENCOUNTER — Telehealth: Payer: Self-pay | Admitting: Family Medicine

## 2013-09-30 ENCOUNTER — Encounter: Payer: Self-pay | Admitting: *Deleted

## 2013-09-30 ENCOUNTER — Ambulatory Visit (INDEPENDENT_AMBULATORY_CARE_PROVIDER_SITE_OTHER): Payer: Medicare Other | Admitting: Gastroenterology

## 2013-09-30 VITALS — BP 130/50 | HR 84 | Ht 60.0 in | Wt 179.2 lb

## 2013-09-30 DIAGNOSIS — IMO0002 Reserved for concepts with insufficient information to code with codable children: Secondary | ICD-10-CM | POA: Diagnosis not present

## 2013-09-30 DIAGNOSIS — I200 Unstable angina: Secondary | ICD-10-CM

## 2013-09-30 DIAGNOSIS — K9401 Colostomy hemorrhage: Secondary | ICD-10-CM | POA: Insufficient documentation

## 2013-09-30 DIAGNOSIS — D500 Iron deficiency anemia secondary to blood loss (chronic): Secondary | ICD-10-CM | POA: Diagnosis not present

## 2013-09-30 MED ORDER — LOSARTAN POTASSIUM 50 MG PO TABS: 50.0000 mg | ORAL_TABLET | Freq: Every day | ORAL | Status: DC

## 2013-09-30 NOTE — Patient Instructions (Signed)
You have been scheduled for an appointment [facility]. Your appointment is on 10-01-2013 at 115. Please arrive 15 minutes early for registration. Make certain to bring a list of current medications, including any over the counter medications or vitamins. Also bring your co-pay if you have one as well as your insurance cards. [facility] is located at [address]. Should you need to reschedule your appointment, please contact them at [phone].

## 2013-09-30 NOTE — Progress Notes (Signed)
     09/30/2013 [name] [id number] [birth year]   History of Present Illness:  This is a 63 year old female who is known to Dr. [name]. She has multiple medical problems including perforated colon during colonoscopy due to acute diverticulitis infection requiring surgery and colostomy formation in approximately 2008, history of squamous cell lung cancer, COPD on oxygen, hyperlipidemia, hypertension, diabetes mellitus, and cirrhosis.  For the past couple of years she has been experiencing issues with bleeding from her ostomy bag. She underwent EGD in February 2013, which showed portal gastropathy with vascular ectasia. Colonoscopy in February 2013 via her ostomy site showed mild inflammation of the mucosa of the ostomy stump, but was otherwise normal. Wireless capsule endoscopy in February 2013 showed a few tiny red spots throughout the small bowel, but no definite sign of gastrointestinal bleeding. She had another EGD in March 2014 at [facility] which revealed portal hypertensive gastropathy; it was indicated that this could be a source of bleeding. She is here again today with ongoing issues of bleeding per her ostomy. She says that normally the blood is darker in color, but recently it has been more red in color. She believes that the bleeding is coming from the ostomy site itself. 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 She has required transfusion of packed red blood cells recently when she dropped from a hemoglobin of 8.8 g to 6.9 g within a week time. After transfusion her most recent hemoglobin was 9.7 g just one week ago. She is receiving oral and IV iron supplementation. She says that she had her spleen removed back in January because it was enlarged and they thought that it may help control of bleeding. The bleeding did improve or resolve for a while before returning again, however.   Current Medications, Allergies,  Past Medical History, Past Surgical History, Family History and Social History were reviewed in [employer] record.   Physical Exam: BP 130/50  Pulse 84  Ht 5' (1.524 m)  Wt 179 lb 4 oz (81.307 kg)  BMI 35.01 kg/m2 General: Well developed white female in no acute distress Head: Normocephalic and atraumatic Eyes:  Sclerae anicteric, conjunctiva pink  Ears: Normal auditory acuity Lungs: Clear throughout to auscultation, but breath sounds decreased. Heart: Regular rate and rhythm. Abdomen: Soft, obese, non-distended.  Large laparotomy incision noted on abdomen.  Previous cholecystectomy scar noted.  Ostomy noted on left abdomen and small amoutn of dark brown stool with some reddish color noted as well.  Normal bowel sounds.  Diffuse TTP. Musculoskeletal: Symmetrical with no gross deformities  Extremities: No edema  Neurological: Alert oriented x 4, grossly non-focal Psychological:  Alert and cooperative. Normal mood and affect  Assessment and Recommendations: -Bleeding per ostomy:  Several procedures since 2013 have shown no active GI bleeding source.  The patient thinks that the bleeding is coming from the peristomal area.  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  Will refer to [facility], particularly Dr. Leighton Ruff for further evaluation and second opinion.  Recent drop in Hgb, but now s/p transfusion with PRBC's.  Blood counts are monitored by oncology.  Transfusions and iron supplements per oncology.

## 2013-09-30 NOTE — Telephone Encounter (Signed)
Pt is needing new rx losartan (COZAAR) 50 MG tablet, pt states she is completely out and the pharmacy told her they sent over the request twice already. Send to [facility] on [address].

## 2013-09-30 NOTE — Telephone Encounter (Signed)
I sent script e-scribe and left a voice message for pt. 

## 2013-10-01 ENCOUNTER — Encounter (INDEPENDENT_AMBULATORY_CARE_PROVIDER_SITE_OTHER): Payer: Self-pay | Admitting: Surgery

## 2013-10-01 NOTE — Progress Notes (Signed)
Reviewed and agree with management. Ioma Chismar D. Juanell Saffo, M.D., FACG  

## 2013-10-04 ENCOUNTER — Encounter: Payer: Self-pay | Admitting: Internal Medicine

## 2013-10-05 ENCOUNTER — Other Ambulatory Visit: Payer: Self-pay

## 2013-10-05 ENCOUNTER — Telehealth: Payer: Self-pay | Admitting: *Deleted

## 2013-10-05 ENCOUNTER — Other Ambulatory Visit (INDEPENDENT_AMBULATORY_CARE_PROVIDER_SITE_OTHER): Payer: Medicare Other

## 2013-10-05 ENCOUNTER — Telehealth: Payer: Self-pay | Admitting: Family Medicine

## 2013-10-05 ENCOUNTER — Telehealth: Payer: Self-pay

## 2013-10-05 DIAGNOSIS — E1149 Type 2 diabetes mellitus with other diabetic neurological complication: Secondary | ICD-10-CM

## 2013-10-05 DIAGNOSIS — K2961 Other gastritis with bleeding: Secondary | ICD-10-CM

## 2013-10-05 LAB — LIPID PANEL
CHOL/HDL RATIO: 4
Cholesterol: 173 mg/dL (ref 0–200)
HDL: 45.1 mg/dL (ref >39.00)
LDL Cholesterol: 111 mg/dL — ABNORMAL HIGH (ref 0–99)
NONHDL: 127.9
Triglycerides: 86 mg/dL (ref 0.0–149.0)
VLDL: 17.2 mg/dL (ref 0.0–40.0)

## 2013-10-05 MED ORDER — FUROSEMIDE 40 MG PO TABS: 80.0000 mg | ORAL_TABLET | Freq: Two times a day (BID) | ORAL | Status: DC

## 2013-10-05 NOTE — Telephone Encounter (Signed)
Patient called about the insulin Dr. Cruzita Lederer lowered at her last visit, she said she's now having sugars in the high 200's to the mid to high 300's.   She said it's mainly in the afternoon through the nighttime.  CB# [phone]

## 2013-10-05 NOTE — Telephone Encounter (Signed)
Refill request for Ferrex 150 mg capsule take 1 po bid and send to [facility].

## 2013-10-05 NOTE — Telephone Encounter (Signed)
Please ask her to: - increase Lantus to 40 units in am and 35 at bedtime - increase Humalog from 45-35-40 units >> 45-40-40 with B'fast-lunch-dinner, respectively Please call in few days if sugars do not improve.

## 2013-10-05 NOTE — Telephone Encounter (Signed)
Please read note below and advise.  

## 2013-10-05 NOTE — Telephone Encounter (Signed)
I sent script e-scribe and spoke with pt. 

## 2013-10-05 NOTE — Telephone Encounter (Signed)
Message copied by Greggory Keen on Tue Oct 05, 2013 11:21 AM ------      Message from: Erskine Emery D      Created: Tue Oct 05, 2013  9:45 AM       I will schedule patient for upper endoscopy and plan to treat any vascular ectasia with the Lyndle Herrlich, please schedule EGD with possible APC at the hospital      ----- Message -----         From: Michael Boston, MD         Sent: 10/01/2013   4:58 PM           To: [name], MD            Saw patient in clinic today.  Has episodes of bleeding clots to her colostomy.  Complicated patient.  I see no evidence of bleeding source at the colostomy itself.  I suspected source is her portal venous gastropathy and she would benefit from upper endoscopy urgently when she gets these episodes.  ?Bleeding scan? Does she have portal hypertension =  Start propranolol?  TIPS?       ------

## 2013-10-05 NOTE — Telephone Encounter (Signed)
Pt needs new rx lasix 40 mg 4 times  a day (160 mg a day) call into [facility] [address]. Pt needs med today.

## 2013-10-06 NOTE — Telephone Encounter (Signed)
Called pt and advised her per Dr Arman Filter note. Pt understood. Pt will call on Friday to let me know how she's doing.

## 2013-10-07 MED ORDER — POLYSACCHARIDE IRON COMPLEX 150 MG PO CAPS: 150.0000 mg | ORAL_CAPSULE | Freq: Two times a day (BID) | ORAL | Status: DC

## 2013-10-07 NOTE — Telephone Encounter (Signed)
I have the patient scheduled

## 2013-10-07 NOTE — Telephone Encounter (Signed)
Patient is aware of the date for the EGD. She will call us if she has bleeding prior to the date. Instructions mailed to her home address.

## 2013-10-07 NOTE — Telephone Encounter (Signed)
Pt was on Niferex 150 mg capsule take 1 po bid. Refill request for Ferrex 150 mg take 1 po bid, this is much cheaper and more cost effective than the Niferex. Per [name] okay to change to the [facility] and same directions. I sent script e-scribe and spoke with pt.

## 2013-10-08 ENCOUNTER — Telehealth: Payer: Self-pay | Admitting: Internal Medicine

## 2013-10-08 NOTE — Telephone Encounter (Signed)
Returned pt's call. Pt stated that her blood sugar is running in the 200-300's in the afternoon and evening. Monday after lunch 308. Tuesday afternoon 323, Wed afternoon 341, last night it was 218 before bed and this AM it was 166. Please advise.

## 2013-10-08 NOTE — Telephone Encounter (Signed)
[name], please advise her to: - decrease Lantus to 40 units in am and 35 at bedtime - increase Humalog to 50-40-40 units with B'fast-lunch-dinner, respectively I don't understand this change, her sugars were improving at the last visit ~ 1 weeks ago.Marland KitchenMarland Kitchen

## 2013-10-08 NOTE — Telephone Encounter (Signed)
Patient stated that her Blood Sugars are still running in the 300, please on what to do.

## 2013-10-08 NOTE — Telephone Encounter (Signed)
Called pt and advised her per Dr Arman Filter note. Pt understood. Pt will let us know next week how her blood sugar levels are.

## 2013-10-13 ENCOUNTER — Ambulatory Visit (INDEPENDENT_AMBULATORY_CARE_PROVIDER_SITE_OTHER): Payer: Medicare Other | Admitting: Family Medicine

## 2013-10-13 ENCOUNTER — Encounter: Payer: Self-pay | Admitting: Family Medicine

## 2013-10-13 VITALS — BP 128/50 | HR 84 | Temp 98.7°F | Ht 60.0 in | Wt 190.0 lb

## 2013-10-13 DIAGNOSIS — E1149 Type 2 diabetes mellitus with other diabetic neurological complication: Secondary | ICD-10-CM

## 2013-10-13 DIAGNOSIS — Z23 Encounter for immunization: Secondary | ICD-10-CM

## 2013-10-13 DIAGNOSIS — I200 Unstable angina: Secondary | ICD-10-CM

## 2013-10-13 DIAGNOSIS — I5022 Chronic systolic (congestive) heart failure: Secondary | ICD-10-CM

## 2013-10-13 DIAGNOSIS — C3490 Malignant neoplasm of unspecified part of unspecified bronchus or lung: Secondary | ICD-10-CM

## 2013-10-13 DIAGNOSIS — I10 Essential (primary) hypertension: Secondary | ICD-10-CM

## 2013-10-13 DIAGNOSIS — K922 Gastrointestinal hemorrhage, unspecified: Secondary | ICD-10-CM

## 2013-10-13 DIAGNOSIS — E785 Hyperlipidemia, unspecified: Secondary | ICD-10-CM

## 2013-10-13 DIAGNOSIS — I509 Heart failure, unspecified: Secondary | ICD-10-CM

## 2013-10-13 MED ORDER — ATORVASTATIN CALCIUM 20 MG PO TABS: 20.0000 mg | ORAL_TABLET | Freq: Every day | ORAL | Status: DC

## 2013-10-13 MED ORDER — OXYCODONE HCL 20 MG PO TABS: 20.0000 mg | ORAL_TABLET | ORAL | Status: DC | PRN

## 2013-10-13 MED ORDER — HYDRALAZINE HCL 25 MG PO TABS: 37.5000 mg | ORAL_TABLET | Freq: Two times a day (BID) | ORAL | Status: DC

## 2013-10-13 MED ORDER — PROMETHAZINE HCL 25 MG PO TABS: 25.0000 mg | ORAL_TABLET | ORAL | Status: DC | PRN

## 2013-10-13 MED ORDER — ISOSORBIDE DINITRATE 20 MG PO TABS: 20.0000 mg | ORAL_TABLET | Freq: Two times a day (BID) | ORAL | Status: DC

## 2013-10-13 NOTE — Progress Notes (Signed)
   Subjective:    Patient ID: [name], female    DOB: [birth year], 63 y.o.   MRN: [id number]  HPI Here for follow up and for med refills. She is doing well in general although she is quite frustrated because no one can find the source of her GI bleeding. She is scheduled to have another upper endoscopy per Dr. [name] on 11-02-13. She had her pacemaker placed a few months ago and this seems to be working properly. She is now on continuous [place] oxygen at 2 liters. She sees Dr. Cruzita Lederer for her diabetes, and her last A1c on 07-29-13 was excellent at 6.8. Her labs a few days ago showed her LDL to be elevated at 111.    Review of Systems  Constitutional: Positive for fatigue.  Respiratory: Positive for shortness of breath. Negative for cough, choking, chest tightness and wheezing.   Cardiovascular: Negative.   Gastrointestinal: Positive for blood in stool. Negative for nausea, vomiting, abdominal pain, diarrhea, constipation, abdominal distention, anal bleeding and rectal pain.  Neurological: Negative.        Objective:   Physical Exam  Constitutional: She is oriented to person, place, and time.  Walks with her walker   Cardiovascular: Normal rate, regular rhythm, normal heart sounds and intact distal pulses.   Pulmonary/Chest: Effort normal. No respiratory distress. She has no rales.  Soft scattered wheezes   Neurological: She is alert and oriented to person, place, and time.          Assessment & Plan:  She seems to be fairly stable now but we are still trying to find the source of her GI bleeding. If no source is seen on her upcoming endoscopy, perhaps we could try getting a tagged RBC study. We will add Lipitor 20 mg daily to her regimen and will recheck lipids in 90 days. Given a flu shot.

## 2013-10-13 NOTE — Progress Notes (Signed)
Pre visit review using our clinic review tool, if applicable. No additional management support is needed unless otherwise documented below in the visit note. 

## 2013-10-14 ENCOUNTER — Telehealth: Payer: Self-pay | Admitting: Internal Medicine

## 2013-10-14 NOTE — Telephone Encounter (Signed)
Please read note below and advise.  

## 2013-10-14 NOTE — Telephone Encounter (Signed)
Is her Humalog expired? If not >> please advise her to:  - decrease Lantus to 40 units in am and 30 (instead of 35) at bedtime  - increase Humalog to 55-45-45 units with B'fast-lunch-dinner, respectively  Flus hot should be fine, Lipitor (I saw this was added) can cause slightly higher sugars.Marland KitchenMarland Kitchen

## 2013-10-14 NOTE — Telephone Encounter (Signed)
Called pt and advised her per Dr Arman Filter note. Pt said that her Humalog has not expired. She wrote down what she needed to change on her dosage of insulins. Pt understood and will call if she has any problems.

## 2013-10-14 NOTE — Telephone Encounter (Signed)
Patient states that ever since Dr. Cruzita Lederer has changed her medication decreased   Patients blood sugar reading   Yesterday: breakfast 98 lunch 380 Dinner 328 Bed time 341  Today:  Breakfast 172 10:30 am 443  Patient states she just cant get sugars in control   lantus 45 afternoon 35 bed time  humalog 50 am 40 noon 40 dinner  Could flu shot cause this?  Please advise   Thank You

## 2013-10-15 ENCOUNTER — Encounter (HOSPITAL_COMMUNITY): Payer: Self-pay | Admitting: Pharmacy Technician

## 2013-10-15 ENCOUNTER — Telehealth: Payer: Self-pay | Admitting: Family Medicine

## 2013-10-15 NOTE — Telephone Encounter (Signed)
Yes, per [name] to give verbal order. I did call and leave a voice message for [place] with home health.

## 2013-10-15 NOTE — Telephone Encounter (Signed)
[name] states they needs "hold" order for pt. She is leaving town this afternoon for 2 weeks and they would like to re-start on 11/08/13.  [name] states you can leave a message on voicemail if needed.

## 2013-10-19 ENCOUNTER — Telehealth: Payer: Self-pay | Admitting: Family Medicine

## 2013-10-19 NOTE — Telephone Encounter (Signed)
Katrina with [facility] calling re: promethazine 25mg  // metoclopramide 10mg .

## 2013-10-21 NOTE — Telephone Encounter (Signed)
I spoke with pharmacy and [name] is aware of the possible drug interaction, okay to dispense.

## 2013-10-22 ENCOUNTER — Encounter (HOSPITAL_COMMUNITY): Payer: Self-pay | Admitting: *Deleted

## 2013-10-25 ENCOUNTER — Telehealth: Payer: Self-pay | Admitting: Family Medicine

## 2013-10-25 NOTE — Telephone Encounter (Signed)
Pt went to ED last night bc she has been sleeping a lot. Pt states she just nods off easily and the past 2 days it has been worse  Pt is out of town in [place], [place] and her daughter took her last night.  Pt will not be back in [place] until the weekend.  They did blood work and told pt she has an infection.  They don't know what kind. They gave pt a Z-Pak and she wants to know if she should take it.

## 2013-10-25 NOTE — Telephone Encounter (Signed)
Per [name], pt should follow the advice that was given her by the ER physician. Pt will start on the antibiotic and follow up with [place] when she gets back home.

## 2013-10-29 ENCOUNTER — Telehealth: Payer: Self-pay | Admitting: Family Medicine

## 2013-10-29 NOTE — Telephone Encounter (Signed)
[name] from Home care called left a voice message, said pt is due for Ha1c. Can we fax over a order for this? Fax # [phone] and the phone number is [phone].

## 2013-11-01 NOTE — Telephone Encounter (Signed)
This was done.

## 2013-11-02 ENCOUNTER — Encounter (HOSPITAL_COMMUNITY): Payer: Medicare Other | Admitting: Anesthesiology

## 2013-11-02 ENCOUNTER — Ambulatory Visit: Payer: Medicare Other | Admitting: Gastroenterology

## 2013-11-02 ENCOUNTER — Encounter (HOSPITAL_COMMUNITY)
Admission: RE | Disposition: A | Discharge status: Home / Self Care | Payer: Self-pay | Source: Ambulatory Visit | Attending: Gastroenterology

## 2013-11-02 ENCOUNTER — Ambulatory Visit (HOSPITAL_COMMUNITY): Payer: Medicare Other | Admitting: Anesthesiology

## 2013-11-02 ENCOUNTER — Ambulatory Visit (HOSPITAL_COMMUNITY)
Admission: RE | Admit: 2013-11-02 | Discharge: 2013-11-02 | Disposition: A | Discharge status: Home / Self Care | Payer: Medicare Other | Source: Ambulatory Visit | Attending: Gastroenterology | Admitting: Gastroenterology

## 2013-11-02 ENCOUNTER — Other Ambulatory Visit: Payer: Self-pay

## 2013-11-02 ENCOUNTER — Encounter (HOSPITAL_COMMUNITY): Payer: Self-pay | Admitting: *Deleted

## 2013-11-02 DIAGNOSIS — J449 Chronic obstructive pulmonary disease, unspecified: Secondary | ICD-10-CM | POA: Insufficient documentation

## 2013-11-02 DIAGNOSIS — K921 Melena: Secondary | ICD-10-CM | POA: Diagnosis present

## 2013-11-02 DIAGNOSIS — E119 Type 2 diabetes mellitus without complications: Secondary | ICD-10-CM | POA: Diagnosis not present

## 2013-11-02 DIAGNOSIS — I10 Essential (primary) hypertension: Secondary | ICD-10-CM | POA: Diagnosis not present

## 2013-11-02 DIAGNOSIS — K7460 Unspecified cirrhosis of liver: Secondary | ICD-10-CM | POA: Insufficient documentation

## 2013-11-02 DIAGNOSIS — Z9981 Dependence on supplemental oxygen: Secondary | ICD-10-CM | POA: Insufficient documentation

## 2013-11-02 DIAGNOSIS — E785 Hyperlipidemia, unspecified: Secondary | ICD-10-CM | POA: Diagnosis not present

## 2013-11-02 DIAGNOSIS — Z85118 Personal history of other malignant neoplasm of bronchus and lung: Secondary | ICD-10-CM | POA: Insufficient documentation

## 2013-11-02 DIAGNOSIS — K2961 Other gastritis with bleeding: Secondary | ICD-10-CM

## 2013-11-02 HISTORY — PX: HOT HEMOSTASIS: SHX5433

## 2013-11-02 HISTORY — DX: Adverse effect of unspecified anesthetic, initial encounter: T41.45XA

## 2013-11-02 HISTORY — DX: Other complications of anesthesia, initial encounter: T88.59XA

## 2013-11-02 HISTORY — DX: Heart failure, unspecified: I50.9

## 2013-11-02 HISTORY — DX: Sleep apnea, unspecified: G47.30

## 2013-11-02 HISTORY — PX: ESOPHAGOGASTRODUODENOSCOPY (EGD) WITH PROPOFOL: SHX5813

## 2013-11-02 SURGERY — ESOPHAGOGASTRODUODENOSCOPY (EGD) WITH PROPOFOL
Anesthesia: Monitor Anesthesia Care

## 2013-11-02 MED ORDER — LIDOCAINE HCL (CARDIAC) 20 MG/ML IV SOLN
INTRAVENOUS | Status: AC
  Filled 2013-11-02: qty 5

## 2013-11-02 MED ORDER — PROPOFOL 10 MG/ML IV BOLUS
INTRAVENOUS | Status: AC
  Filled 2013-11-02: qty 20

## 2013-11-02 MED ORDER — LACTATED RINGERS IV SOLN
INTRAVENOUS | Status: DC
  Administered 2013-11-02: 1000 mL via INTRAVENOUS

## 2013-11-02 MED ORDER — LACTATED RINGERS IV SOLN
INTRAVENOUS | Status: DC | PRN
  Administered 2013-11-02: 08:00:00 via INTRAVENOUS

## 2013-11-02 MED ORDER — PHENYLEPHRINE 40 MCG/ML (10ML) SYRINGE FOR IV PUSH (FOR BLOOD PRESSURE SUPPORT)
PREFILLED_SYRINGE | INTRAVENOUS | Status: AC
  Filled 2013-11-02: qty 10

## 2013-11-02 MED ORDER — PROPOFOL 10 MG/ML IV EMUL
INTRAVENOUS | Status: DC | PRN
  Administered 2013-11-02: 40 mg via INTRAVENOUS

## 2013-11-02 MED ORDER — PROPOFOL INFUSION 10 MG/ML OPTIME
INTRAVENOUS | Status: DC | PRN
  Administered 2013-11-02: 140 ug/kg/min via INTRAVENOUS

## 2013-11-02 MED ORDER — SODIUM CHLORIDE 0.9 % IV SOLN: INTRAVENOUS | Status: DC

## 2013-11-02 SURGICAL SUPPLY — 15 items

## 2013-11-02 NOTE — Discharge Instructions (Signed)
Esophagogastroduodenoscopy Care After Refer to this sheet in the next few weeks. These instructions provide you with information on caring for yourself after your procedure. Your caregiver may also give you more specific instructions. Your treatment has been planned according to current medical practices, but problems sometimes occur. Call your caregiver if you have any problems or questions after your procedure.  HOME CARE INSTRUCTIONS  Do not eat or drink anything until the numbing medicine (local anesthetic) has worn off and your gag reflex has returned. You will know that the local anesthetic has worn off when you can swallow comfortably.  Do not drive for 12 hours after the procedure or as directed by your caregiver.  Only take medicines as directed by your caregiver. SEEK MEDICAL CARE IF:   You cannot stop coughing.  You are not urinating at all or less than usual. SEEK IMMEDIATE MEDICAL CARE IF:  You have difficulty swallowing.  You cannot eat or drink.  You have worsening throat or chest pain.  You have dizziness, lightheadedness, or you faint.  You have nausea or vomiting.  You have chills.  You have a fever.  You have severe abdominal pain.  You have black, tarry, or bloody stools. Document Released: 01/01/2012 Document Reviewed: 01/01/2012 [facility] Patient Information 2015 [place]. This information is not intended to replace advice given to you by your health care provider. Make sure you discuss any questions you have with your health care provider.   Monitored Anesthesia Care Monitored anesthesia care is an anesthesia service for a medical procedure. Anesthesia is the loss of the ability to feel pain. It is produced by medicines called anesthetics. It may affect a small area of your body (local anesthesia), a large area of your body (regional anesthesia), or your entire body (general anesthesia). The need for monitored anesthesia care depends your procedure,  your condition, and the potential need for regional or general anesthesia. It is often provided during procedures where:   General anesthesia may be needed if there are complications. This is because you need special care when you are under general anesthesia.   You will be under local or regional anesthesia. This is so that you are able to have higher levels of anesthesia if needed.   You will receive calming medicines (sedatives). This is especially the case if sedatives are given to put you in a semi-conscious state of relaxation (deep sedation). This is because the amount of sedative needed to produce this state can be hard to predict. Too much of a sedative can produce general anesthesia. Monitored anesthesia care is performed by one or more health care providers who have special training in all types of anesthesia. You will need to meet with these health care providers before your procedure. During this meeting, they will ask you about your medical history. They will also give you instructions to follow. (For example, you will need to stop eating and drinking before your procedure. You may also need to stop or change medicines you are taking.) During your procedure, your health care providers will stay with you. They will:   Watch your condition. This includes watching your blood pressure, breathing, and level of pain.   Diagnose and treat problems that occur.   Give medicines if they are needed. These may include calming medicines (sedatives) and anesthetics.   Make sure you are comfortable.  Having monitored anesthesia care does not necessarily mean that you will be under anesthesia. It does mean that your health care providers  will be able to manage anesthesia if you need it or if it occurs. It also means that you will be able to have a different type of anesthesia than you are having if you need it. When your procedure is complete, your health care providers will continue to watch  your condition. They will make sure any medicines wear off before you are allowed to go home.  Document Released: 10/10/2004 Document Revised: 05/31/2013 Document Reviewed: 02/26/2012 [facility] Patient Information 2015 [place], [place]. This information is not intended to replace advice given to you by your health care provider. Make sure you discuss any questions you have with your health care provider.

## 2013-11-02 NOTE — Anesthesia Postprocedure Evaluation (Signed)
  Anesthesia Post-op Note  Patient: [name]  Procedure(s) Performed: Procedure(s) (LRB): ESOPHAGOGASTRODUODENOSCOPY (EGD) WITH PROPOFOL (N/A) HOT HEMOSTASIS (ARGON PLASMA COAGULATION/BICAP) (N/A)  Patient Location: PACU  Anesthesia Type: MAC  Level of Consciousness: awake and alert   Airway and Oxygen Therapy: Patient Spontanous Breathing  Post-op Pain: mild  Post-op Assessment: Post-op Vital signs reviewed, Patient's Cardiovascular Status Stable, Respiratory Function Stable, Patent Airway and No signs of Nausea or vomiting  Last Vitals:  Filed Vitals:   11/02/13 0932  BP:   Pulse: 68  Temp: 36.6 C  Resp: 13    Post-op Vital Signs: stable   Complications: No apparent anesthesia complications

## 2013-11-02 NOTE — H&P (Signed)
  [name]  [id number]  01-Jul-1950  History of Present Illness: This is a 63 year old female who is known to Dr. [name]. She has multiple medical problems including perforated colon during colonoscopy due to acute diverticulitis infection requiring surgery and colostomy formation in approximately 2008, history of squamous cell lung cancer, COPD on oxygen, hyperlipidemia, hypertension, diabetes mellitus, and cirrhosis. For the past couple of years she has been experiencing issues with bleeding from her ostomy bag. She underwent EGD in February 2013, which showed portal gastropathy with vascular ectasia. Colonoscopy in February 2013 via her ostomy site showed mild inflammation of the mucosa of the ostomy stump, but was otherwise normal. Wireless capsule endoscopy in February 2013 showed a few tiny red spots throughout the small bowel, but no definite sign of gastrointestinal bleeding. She had another EGD in March 2014 at [facility] which revealed portal hypertensive gastropathy; it was indicated that this could be a source of bleeding. She is here again today with ongoing issues of bleeding per her ostomy. She says that normally the blood is darker in color, but recently it has been more red in color. She believes that the bleeding is coming from the ostomy site itself. 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 She has required transfusion of packed red blood cells recently when she dropped from a hemoglobin of 8.8 g to 6.9 g within a week time. After transfusion her most recent hemoglobin was 9.7 g just one week ago. She is receiving oral and IV iron supplementation. She says that she had her spleen removed back in January because it was enlarged and they thought that it may help control of bleeding. The bleeding did improve or resolve for a while before returning again, however.  Current Medications, Allergies, Past Medical History,  Past Surgical History, Family History and Social History were reviewed in [employer] record.  Physical Exam:  BP 130/50  Pulse 84  Ht 5' (1.524 m)  Wt 179 lb 4 oz (81.307 kg)  BMI 35.01 kg/m2  General: Well developed white female in no acute distress  Head: Normocephalic and atraumatic  Eyes: Sclerae anicteric, conjunctiva pink  Ears: Normal auditory acuity  Lungs: Clear throughout to auscultation, but breath sounds decreased.  Heart: Regular rate and rhythm.  Abdomen: Soft, obese, non-distended. Large laparotomy incision noted on abdomen. Previous cholecystectomy scar noted. Ostomy noted on left abdomen and small amoutn of dark brown stool with some reddish color noted as well. Normal bowel sounds. Diffuse TTP.  Musculoskeletal: Symmetrical with no gross deformities  Extremities: No edema  Neurological: Alert oriented x 4, grossly non-focal  Psychological: Alert and cooperative. Normal mood and affect  Assessment and Recommendations:  -Bleeding per ostomy: Several procedures since 2013 have shown no active GI bleeding source. The patient thinks that the bleeding is coming from the peristomal area. 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  Plan upper endoscopy to assess for portal hypertensive gastropathy or varices it may be amenable to treatment.

## 2013-11-02 NOTE — Op Note (Addendum)
[facility] [place], [address]   ENDOSCOPY PROCEDURE REPORT  PATIENT: [name], [name]  MR#: [id number] BIRTHDATE: 09/10/1950 , 62  yrs. old GENDER: female ENDOSCOPIST: [name], MD REFERRED BY: PROCEDURE DATE:  11/02/2013 PROCEDURE:  EGD, diagnostic ASA CLASS:     Class III INDICATIONS:  hematochezia. MEDICATIONS: Monitored anesthesia care TOPICAL ANESTHETIC:  DESCRIPTION OF PROCEDURE: After the risks benefits and alternatives of the procedure were thoroughly explained, informed consent was obtained.  The Pentax Gastroscope [id number] endoscope was introduced through the mouth and advanced to the third portion of the duodenum , Without limitations.  The instrument was slowly withdrawn as the mucosa was fully examined.      EXAM: The esophagus and gastroesophageal junction were completely normal in appearance.  The stomach was entered and closely examined.The antrum, angularis, and lesser curvature were well visualized, including a retroflexed view of the cardia and fundus. The stomach wall was normally distensable.  The scope passed easily through the pylorus into the duodenum.  Retroflexed views revealed no abnormalities.     The scope was then withdrawn from the patient and the procedure completed.  COMPLICATIONS: There were no immediate complications.  ENDOSCOPIC IMPRESSION: Normal appearing esophagus and GE junction, the stomach was well visualized and normal in appearance, normal appearing duodenum  RECOMMENDATIONS: Do   CTA  REPEAT EXAM:  eSigned:  [name], MD 11/03/2013 11:06 AM Revised: 11/03/2013 11:06 AM   JS:[id number] Loni Muse Sarajane Jews, MD and Michael Boston, MD

## 2013-11-02 NOTE — Anesthesia Preprocedure Evaluation (Addendum)
Anesthesia Evaluation  Patient identified by MRN, date of birth, ID band Patient awake    Reviewed: Allergy & Precautions, H&P , NPO status , Patient's Chart, lab work & pertinent test results  History of Anesthesia Complications (+) Family history of anesthesia reaction and history of anesthetic complications  Airway Mallampati: II TM Distance: >3 FB Neck ROM: Full    Dental  (+) Edentulous Upper, Partial Lower   Pulmonary asthma , sleep apnea and Oxygen sleep apnea , pneumonia -, resolved, COPD COPD inhaler, former smoker,  Home oxygen 2 L/min 24 hr/day breath sounds clear to auscultation No wheezing.  Decreased breath sounds left base.  + decreased breath sounds      Cardiovascular hypertension, Pt. on medications + angina +CHF + dysrhythmias + Valvular Problems/Murmurs Rhythm:Regular Rate:Normal     Neuro/Psych  Headaches, PSYCHIATRIC DISORDERS Anxiety Depression  Neuromuscular disease    GI/Hepatic GERD-  Medicated,(+) Cirrhosis -      ,   Endo/Other  diabetes, Insulin Dependent  Renal/GU Renal disease  negative genitourinary   Musculoskeletal  (+) Arthritis -,   Abdominal   Peds negative pediatric ROS (+)  Hematology  (+) anemia ,   Anesthesia Other Findings   Reproductive/Obstetrics negative OB ROS                         Anesthesia Physical Anesthesia Plan  ASA: IV  Anesthesia Plan: MAC   Post-op Pain Management:    Induction: Intravenous  Airway Management Planned:   Additional Equipment:   Intra-op Plan:   Post-operative Plan:   Informed Consent: I have reviewed the patients History and Physical, chart, labs and discussed the procedure including the risks, benefits and alternatives for the proposed anesthesia with the patient or authorized representative who has indicated his/her understanding and acceptance.   Dental advisory given  Plan Discussed with:  CRNA  Anesthesia Plan Comments: (She states her breathing is at baseline.)       Anesthesia Quick Evaluation

## 2013-11-02 NOTE — Transfer of Care (Signed)
Immediate Anesthesia Transfer of Care Note  Patient: [name]  Procedure(s) Performed: Procedure(s) with comments: ESOPHAGOGASTRODUODENOSCOPY (EGD) WITH PROPOFOL (N/A) - EGD with APC HOT HEMOSTASIS (ARGON PLASMA COAGULATION/BICAP) (N/A)  Patient Location: PACU  Anesthesia Type:MAC  Level of Consciousness: awake, alert  and oriented  Airway & Oxygen Therapy: Patient Spontanous Breathing and Patient connected to nasal cannula oxygen  Post-op Assessment: Report given to PACU RN and Post -op Vital signs reviewed and stable  Post vital signs: Reviewed and stable  Complications: No apparent anesthesia complications

## 2013-11-03 ENCOUNTER — Encounter (HOSPITAL_COMMUNITY): Payer: Self-pay | Admitting: Gastroenterology

## 2013-11-03 ENCOUNTER — Telehealth: Payer: Self-pay | Admitting: Family Medicine

## 2013-11-03 ENCOUNTER — Other Ambulatory Visit: Payer: Self-pay

## 2013-11-03 ENCOUNTER — Telehealth: Payer: Self-pay

## 2013-11-03 DIAGNOSIS — J449 Chronic obstructive pulmonary disease, unspecified: Secondary | ICD-10-CM

## 2013-11-03 DIAGNOSIS — R1030 Lower abdominal pain, unspecified: Secondary | ICD-10-CM

## 2013-11-03 DIAGNOSIS — T8189XA Other complications of procedures, not elsewhere classified, initial encounter: Secondary | ICD-10-CM

## 2013-11-03 DIAGNOSIS — I509 Heart failure, unspecified: Secondary | ICD-10-CM

## 2013-11-03 DIAGNOSIS — E119 Type 2 diabetes mellitus without complications: Secondary | ICD-10-CM

## 2013-11-03 DIAGNOSIS — R109 Unspecified abdominal pain: Secondary | ICD-10-CM

## 2013-11-03 NOTE — Telephone Encounter (Signed)
[name] would like a verbal order to resume home health services. [name] states you can leave the verbal on her voicemail if needed.

## 2013-11-03 NOTE — Telephone Encounter (Signed)
Per [name], okay to give verbal order. I did speak with Anderson [place] and gave verbal order.

## 2013-11-03 NOTE — Telephone Encounter (Signed)
Scheduled for the Ct on 11/26/13. Patient will arrange transportation for her labs prior to that date.Letter with appointment information mailed to the patient per her request.

## 2013-11-11 ENCOUNTER — Telehealth: Payer: Self-pay | Admitting: Family Medicine

## 2013-11-11 ENCOUNTER — Ambulatory Visit: Payer: Medicare Other | Admitting: Internal Medicine

## 2013-11-11 NOTE — Telephone Encounter (Signed)
Varney Biles from [facility] called to say that pt is very fatigue increased weakness, doing a lot of dozing    Varney Biles would like a call back phone umber [phone]

## 2013-11-12 NOTE — Telephone Encounter (Signed)
I tried to call Varney Biles and get more information, however no answer. Do you want to recommend anything based on the below information?

## 2013-11-12 NOTE — Telephone Encounter (Signed)
I spoke with [place] and gave below advice.

## 2013-11-12 NOTE — Telephone Encounter (Signed)
The main thing we worry about with [name] is her getting anemic again from GI blood loss. If the fatigue gets bad she needs to go to the ER to get checked

## 2013-11-16 ENCOUNTER — Telehealth: Payer: Self-pay | Admitting: Family Medicine

## 2013-11-16 NOTE — Telephone Encounter (Signed)
Varney Biles wants to confirm you received pt's lab results for hemoglobin 9.5 and 27.6 hematocrit?? Ok to leave message on voice mail.

## 2013-11-16 NOTE — Telephone Encounter (Signed)
No I did not receive these results until I got this message. Her Hgb is stable (she is usually in the 9's) so there is no eveidence of GI bleeding at this time

## 2013-11-17 ENCOUNTER — Telehealth: Payer: Self-pay | Admitting: Medical Oncology

## 2013-11-17 NOTE — Telephone Encounter (Signed)
[name] , home health nurse called . Pt with hgb 9.5, pt cannot stay awake , very fatigued, passing clots though her colostomy. PCP Dr Sharlene Motts ordered it and is aware of result and pt symptoms.and did not order anything. [name] wanted Mohamed to be aware of hgb, . Her PCP instructed [name] to tell pt to go to ED if symptoms worsen.

## 2013-11-17 NOTE — Telephone Encounter (Signed)
I left a voice message for [name] with the below information.

## 2013-11-22 ENCOUNTER — Ambulatory Visit (INDEPENDENT_AMBULATORY_CARE_PROVIDER_SITE_OTHER): Payer: Medicare Other | Admitting: Internal Medicine

## 2013-11-22 VITALS — BP 124/60 | HR 83 | Temp 98.1°F | Resp 12 | Wt 191.0 lb

## 2013-11-22 DIAGNOSIS — I200 Unstable angina: Secondary | ICD-10-CM

## 2013-11-22 DIAGNOSIS — E1140 Type 2 diabetes mellitus with diabetic neuropathy, unspecified: Secondary | ICD-10-CM

## 2013-11-22 DIAGNOSIS — IMO0002 Reserved for concepts with insufficient information to code with codable children: Secondary | ICD-10-CM

## 2013-11-22 DIAGNOSIS — E1165 Type 2 diabetes mellitus with hyperglycemia: Secondary | ICD-10-CM

## 2013-11-22 MED ORDER — "SYRINGE/NEEDLE (DISP) 25G X 1"" 1 ML MISC": Status: DC

## 2013-11-22 MED ORDER — INSULIN REGULAR HUMAN (CONC) 500 UNIT/ML ~~LOC~~ SOLN: SUBCUTANEOUS | Status: DC

## 2013-11-22 NOTE — Progress Notes (Signed)
Patient ID: [name], female   DOB: Sep 02, 1950, 63 y.o.   MRN: [id number]  HPI: [name] is a 63 y.o.-year-old female, returns for f/u for DM2, dx 2004, insulin-dependent since 2006/7 - then off in 2009 - then restarted 08/2012, uncontrolled, with complications (PN, admission for [facility] on 11/14/2012). Last visit 2  mo ago.  She had splenectomy for severe splenomegaly (02/24/2013) >> this was a difficult surgery. Had severe blood loss. She still has blood loss and no source is found. She will have a CT scan soon. Her Hb was 6.9 in 08/2013 >> increased to 9.7. She feels very fatigued >> sleeps throughout the day and does not sleep well at night. She also gets emotional >> cries in the office today.   Last hemoglobin A1c was: Lab Results  Component Value Date   HGBA1C 6.8 07/29/2013   HGBA1C 6.9 04/22/2013   HGBA1C 8.6 11/14/2012   Pt is on a regimen of: - Lantus 40-30 units - pen  - Humalog 45-35-40 >> 55-45-45 units with B-L-D- pens  Copay $0 for her insulins.  Pt checks her sugars 4-6x a day and they are much worse: - am: 150-230 >> 101-260 >> 106-322 >>  87-104 >> 95-180, but in last week, also 200s and even 310 and 320 - 2h after b'fast: 300-499 >> 311-402 - highest sugars of the day >> 275-377 >> 189-200 >> n/c - before lunch: 247-480 >> 221-407 >> 182-381 >> 190-200 >> 216-350 - 2h after lunch: n/c >> 160 x 1 check >> 246- 302 >> n/c - before dinner: 231-504 >> 83-328 - 130-448 >> 67, 140-150 >> 160-309, 401 - bedtime: 280-480 >> 104-435 >> 220-372 >> 104-105 >> 150-330, 416 No lows. Lowest sugar was 42, 45 - not recently; she has hypoglycemia awareness at 70-80. Highest sugar was 400.  - + CKD, last BUN/creatinine:  Lab Results  Component Value Date   BUN 49 09/08/2013   CREATININE 1.29 09/08/2013   - last eye exam was in 04/2012. No DR.  - + numbness and tingling in her feet.  She also has a history of Squamous cell lung cancer, hypertension, COPD, GERD, depression,  anemia and thrombocytopenia, cirrhosis, IBS. She had a BM Bx since I last saw her >> no malignancy.  I reviewed pt's medications, allergies, PMH, social hx, family hx and no changes required, except as mentioned above.  ROS: Constitutional: + weight gain, + fatigue, + subjective hypothermia, + poor sleep Eyes: no blurry vision, no xerophthalmia ENT: no sore throat, no nodules palpated in throat, no dysphagia/odynophagia, no hoarseness Cardiovascular: no CP/SOB/palpitations/leg swelling Respiratory: no cough/SOB Gastrointestinal: no N/no V/+ D/no C Musculoskeletal: + muscle/+ joint aches Skin: no rashes, + easy bruising, + itching, + hair loss Neurological: +  Tremors/no numbness/tingling/dizziness, + HA  PE: BP 124/60  Pulse 83  Temp(Src) 98.1 F (36.7 C) (Oral)  Resp 12  Wt 191 lb (86.637 kg)  SpO2 96% Wt Readings from Last 3 Encounters:  11/22/13 191 lb (86.637 kg)  10/13/13 190 lb (86.183 kg)  09/30/13 179 lb 4 oz (81.307 kg)   Constitutional: obese, pale, in NAD, on O2 Eyes: PERRLA, EOMI, no exophthalmos ENT: moist mucous membranes, no thyromegaly, no cervical lymphadenopathy Cardiovascular: RRR, No MRG Respiratory: CTA B Gastrointestinal: abdomen soft, NT, ND, BS+ Musculoskeletal: no deformities, strength intact in all 4 Skin: moist, warm  ASSESSMENT: 1. DM2, insulin-dependent, uncontrolled, with complications - PN - h/o HHNK admission  PLAN:  1. Patient with  long-standing, recently controlled diabetes, on basal-bolus insulin tx + oral antidiabetic regimen. Sugars are much worse per review of her log - unclear reason. She has higher sugars as the day goes by. - I suggested to start U500 insulin:  Patient Instructions  Please start U500 insulin as follows: - 0.20 mL (draw up to 20 units on the syringe) 30 min before breakfast - 0.12 mL (draw up to 12 units on the syringe) 30 min before lunch - 0.12 mL (draw up to 12 units on the syringe) 30 min before  lunch Please return in 1 month with your sugar log.  Please stop at the lab. - we will check a fructosamine level today - advised to get a new eye exam >> she is due - continue checking sugars at different times of the day - check 4 times a day, rotating checks- I gave her new logs - Return to clinic in 1 mo with sugar log  Office Visit on 11/22/2013  Component Date Value Ref Range Status  . Fructosamine 11/22/2013 360 190 - 270 umol/L Final   Calculated HbA1c is 7.7%. This appears worse than before, but I do not believe her previous HbA1c levels were very accurate 2/2 her anemia.

## 2013-11-22 NOTE — Patient Instructions (Signed)
Please start U500 insulin as follows: - 0.20 mL (draw up to 20 units on the syringe) 30 min before breakfast - 0.12 mL (draw up to 12 units on the syringe) 30 min before lunch - 0.12 mL (draw up to 12 units on the syringe) 30 min before lunch  Please return in 1 month with your sugar log.   Please stop at the lab.

## 2013-11-23 ENCOUNTER — Other Ambulatory Visit: Payer: Self-pay | Admitting: *Deleted

## 2013-11-23 ENCOUNTER — Telehealth: Payer: Self-pay | Admitting: Gastroenterology

## 2013-11-23 MED ORDER — PROMETHAZINE HCL 25 MG PO TABS: 25.0000 mg | ORAL_TABLET | ORAL | Status: DC | PRN

## 2013-11-23 MED ORDER — "INSULIN SYRINGE 29G X 1/2"" 1 ML MISC": Status: DC

## 2013-11-23 NOTE — Telephone Encounter (Signed)
Dr [name] this patient is wanting a script of [place]..... Does she need to be seen first

## 2013-11-23 NOTE — Telephone Encounter (Signed)
Okay to renew [employer]

## 2013-11-23 NOTE — Telephone Encounter (Signed)
Med refilled per Dr [name]

## 2013-11-24 ENCOUNTER — Encounter: Payer: Self-pay | Admitting: Internal Medicine

## 2013-11-24 LAB — FRUCTOSAMINE: FRUCTOSAMINE: 360 umol/L — AB (ref 190–270)

## 2013-11-25 ENCOUNTER — Other Ambulatory Visit (INDEPENDENT_AMBULATORY_CARE_PROVIDER_SITE_OTHER): Payer: Medicare Other

## 2013-11-25 ENCOUNTER — Telehealth: Payer: Self-pay | Admitting: *Deleted

## 2013-11-25 DIAGNOSIS — R109 Unspecified abdominal pain: Secondary | ICD-10-CM

## 2013-11-25 LAB — CREATININE, SERUM: Creatinine, Ser: 1.5 mg/dL — ABNORMAL HIGH (ref 0.4–1.2)

## 2013-11-25 NOTE — Telephone Encounter (Signed)
Pt called confused about the times of her insulin dosage based on Dr Arman Filter note. Clarified to pt that Dr Cruzita Lederer wants her to do 20 units before breakfast, 12 units before lunch and 12 units before dinner. Pt understood.

## 2013-11-26 ENCOUNTER — Ambulatory Visit (HOSPITAL_COMMUNITY): Payer: Medicare Other

## 2013-11-26 ENCOUNTER — Telehealth: Payer: Self-pay | Admitting: Internal Medicine

## 2013-11-26 NOTE — Telephone Encounter (Signed)
Nurse [facility] home health [phone] called and would like the correct dosage for patient medication.

## 2013-11-27 ENCOUNTER — Emergency Department (HOSPITAL_COMMUNITY)
Admission: EM | Admit: 2013-11-27 | Discharge: 2013-11-27 | Disposition: A | Discharge status: Home / Self Care | Payer: Medicare Other | Attending: Emergency Medicine | Admitting: Emergency Medicine

## 2013-11-27 ENCOUNTER — Emergency Department (HOSPITAL_COMMUNITY): Payer: Medicare Other

## 2013-11-27 DIAGNOSIS — E785 Hyperlipidemia, unspecified: Secondary | ICD-10-CM | POA: Diagnosis not present

## 2013-11-27 DIAGNOSIS — Z794 Long term (current) use of insulin: Secondary | ICD-10-CM | POA: Insufficient documentation

## 2013-11-27 DIAGNOSIS — Z8739 Personal history of other diseases of the musculoskeletal system and connective tissue: Secondary | ICD-10-CM | POA: Insufficient documentation

## 2013-11-27 DIAGNOSIS — R109 Unspecified abdominal pain: Secondary | ICD-10-CM | POA: Diagnosis present

## 2013-11-27 DIAGNOSIS — J449 Chronic obstructive pulmonary disease, unspecified: Secondary | ICD-10-CM | POA: Insufficient documentation

## 2013-11-27 DIAGNOSIS — K589 Irritable bowel syndrome without diarrhea: Secondary | ICD-10-CM | POA: Insufficient documentation

## 2013-11-27 DIAGNOSIS — Z9981 Dependence on supplemental oxygen: Secondary | ICD-10-CM | POA: Diagnosis not present

## 2013-11-27 DIAGNOSIS — Z8541 Personal history of malignant neoplasm of cervix uteri: Secondary | ICD-10-CM | POA: Diagnosis not present

## 2013-11-27 DIAGNOSIS — Z880 Allergy status to penicillin: Secondary | ICD-10-CM | POA: Insufficient documentation

## 2013-11-27 DIAGNOSIS — Z8659 Personal history of other mental and behavioral disorders: Secondary | ICD-10-CM | POA: Insufficient documentation

## 2013-11-27 DIAGNOSIS — Z85118 Personal history of other malignant neoplasm of bronchus and lung: Secondary | ICD-10-CM | POA: Diagnosis not present

## 2013-11-27 DIAGNOSIS — K219 Gastro-esophageal reflux disease without esophagitis: Secondary | ICD-10-CM | POA: Diagnosis not present

## 2013-11-27 DIAGNOSIS — Z433 Encounter for attention to colostomy: Secondary | ICD-10-CM | POA: Diagnosis not present

## 2013-11-27 DIAGNOSIS — R739 Hyperglycemia, unspecified: Secondary | ICD-10-CM

## 2013-11-27 DIAGNOSIS — R42 Dizziness and giddiness: Secondary | ICD-10-CM | POA: Diagnosis not present

## 2013-11-27 DIAGNOSIS — E1165 Type 2 diabetes mellitus with hyperglycemia: Secondary | ICD-10-CM | POA: Insufficient documentation

## 2013-11-27 DIAGNOSIS — Z87891 Personal history of nicotine dependence: Secondary | ICD-10-CM | POA: Diagnosis not present

## 2013-11-27 DIAGNOSIS — I10 Essential (primary) hypertension: Secondary | ICD-10-CM | POA: Insufficient documentation

## 2013-11-27 DIAGNOSIS — R200 Anesthesia of skin: Secondary | ICD-10-CM | POA: Diagnosis not present

## 2013-11-27 DIAGNOSIS — D696 Thrombocytopenia, unspecified: Secondary | ICD-10-CM | POA: Insufficient documentation

## 2013-11-27 DIAGNOSIS — R1084 Generalized abdominal pain: Secondary | ICD-10-CM | POA: Diagnosis not present

## 2013-11-27 DIAGNOSIS — Z79899 Other long term (current) drug therapy: Secondary | ICD-10-CM | POA: Insufficient documentation

## 2013-11-27 DIAGNOSIS — R011 Cardiac murmur, unspecified: Secondary | ICD-10-CM | POA: Insufficient documentation

## 2013-11-27 DIAGNOSIS — D649 Anemia, unspecified: Secondary | ICD-10-CM | POA: Insufficient documentation

## 2013-11-27 DIAGNOSIS — I509 Heart failure, unspecified: Secondary | ICD-10-CM | POA: Insufficient documentation

## 2013-11-27 LAB — CBC
HEMATOCRIT: 29.1 % — AB (ref 36.0–46.0)
Hemoglobin: 9.4 g/dL — ABNORMAL LOW (ref 12.0–15.0)
MCH: 27.6 pg (ref 26.0–34.0)
MCHC: 32.3 g/dL (ref 30.0–36.0)
MCV: 85.6 fL (ref 78.0–100.0)
Platelets: 466 10*3/uL — ABNORMAL HIGH (ref 150–400)
RBC: 3.4 MIL/uL — AB (ref 3.87–5.11)
RDW: 15.2 % (ref 11.5–15.5)
WBC: 13.9 10*3/uL — ABNORMAL HIGH (ref 4.0–10.5)

## 2013-11-27 LAB — COMPREHENSIVE METABOLIC PANEL
ALT: 18 U/L (ref 0–35)
AST: 25 U/L (ref 0–37)
Albumin: 3.5 g/dL (ref 3.5–5.2)
Alkaline Phosphatase: 109 U/L (ref 39–117)
Anion gap: 13 (ref 5–15)
BILIRUBIN TOTAL: 0.2 mg/dL — AB (ref 0.3–1.2)
BUN: 29 mg/dL — ABNORMAL HIGH (ref 6–23)
CHLORIDE: 96 meq/L (ref 96–112)
CO2: 28 mEq/L (ref 19–32)
Calcium: 10.6 mg/dL — ABNORMAL HIGH (ref 8.4–10.5)
Creatinine, Ser: 1.02 mg/dL (ref 0.50–1.10)
GFR calc non Af Amer: 58 mL/min — ABNORMAL LOW (ref >90)
GFR, EST AFRICAN AMERICAN: 67 mL/min — AB (ref >90)
GLUCOSE: 212 mg/dL — AB (ref 70–99)
Potassium: 3.8 mEq/L (ref 3.7–5.3)
Sodium: 137 mEq/L (ref 137–147)
Total Protein: 7.6 g/dL (ref 6.0–8.3)

## 2013-11-27 LAB — DIFFERENTIAL
BASOS PCT: 2 % — AB (ref 0–1)
EOS PCT: 6 % — AB (ref 0–5)
Lymphocytes Relative: 27 % (ref 12–46)
Monocytes Relative: 18 % — ABNORMAL HIGH (ref 3–12)
Neutrophils Relative %: 47 % (ref 43–77)

## 2013-11-27 LAB — URINALYSIS, ROUTINE W REFLEX MICROSCOPIC
Bilirubin Urine: NEGATIVE
GLUCOSE, UA: 100 mg/dL — AB
HGB URINE DIPSTICK: NEGATIVE
KETONES UR: NEGATIVE mg/dL
Nitrite: NEGATIVE
PROTEIN: NEGATIVE mg/dL
Specific Gravity, Urine: 1.007 (ref 1.005–1.030)
UROBILINOGEN UA: 0.2 mg/dL (ref 0.0–1.0)
pH: 7 (ref 5.0–8.0)

## 2013-11-27 LAB — CBG MONITORING, ED
Glucose-Capillary: 213 mg/dL — ABNORMAL HIGH (ref 70–99)
Glucose-Capillary: 55 mg/dL — ABNORMAL LOW (ref 70–99)
Glucose-Capillary: 119 mg/dL — ABNORMAL HIGH (ref 70–99)

## 2013-11-27 LAB — URINE MICROSCOPIC-ADD ON

## 2013-11-27 MED ORDER — HYDROMORPHONE HCL 1 MG/ML IJ SOLN
0.5000 mg | Freq: Once | INTRAMUSCULAR | Status: AC
  Administered 2013-11-27: 0.5 mg via INTRAVENOUS
  Filled 2013-11-27: qty 1

## 2013-11-27 MED ORDER — HYDROMORPHONE HCL 1 MG/ML IJ SOLN
1.0000 mg | Freq: Once | INTRAMUSCULAR | Status: AC
  Administered 2013-11-27: 1 mg via INTRAVENOUS
  Filled 2013-11-27: qty 1

## 2013-11-27 MED ORDER — ONDANSETRON HCL 4 MG/2ML IJ SOLN
4.0000 mg | Freq: Once | INTRAMUSCULAR | Status: AC
  Administered 2013-11-27: 4 mg via INTRAVENOUS
  Filled 2013-11-27: qty 2

## 2013-11-27 NOTE — ED Provider Notes (Signed)
CSN: [id number]     Arrival date & time 11/27/13  1617 History   First MD Initiated Contact with Patient 11/27/13 1627     Chief Complaint  Patient presents with  . Numbness  . Shortness of Breath  . Abdominal Pain     (Consider location/radiation/quality/duration/timing/severity/associated sxs/prior Treatment) Patient is a 63 y.o. female presenting with hyperglycemia. The history is provided by the patient. No language interpreter was used.  Hyperglycemia Severity:  Moderate Diabetes status:  Controlled with insulin Associated symptoms: abdominal pain and shortness of breath   Associated symptoms: no dysuria, no fever and no vomiting   Associated symptoms comment:  The patient arrives after finding her blood sugar elevated while at home today. She reports that she is working with her doctor on insulin dosing for better control, including a recent change to humulin R insulin earlier this week. She reports running in the 300's is not unusual but today, prior to her lunch insulin dose, it was 504. She took her medication, ate lunch and CBG was 445 on recheck. She then became lightheaded without syncope and decided to come to be evaluated. No recent illnesses or fever. She has abdominal pain but reports chronic abdominal pain that is no different today. What prompted her visit was the high sugar and lightheadedness. No vomiting.    Past Medical History  Diagnosis Date  . Cirrhosis   . GERD (gastroesophageal reflux disease)   . Cervical disc syndrome     trouble turnng neck at times  . Chronic respiratory failure   . Diverticulitis of colon   . Perforation of colon   . Arthritis   . IBS (irritable bowel syndrome)   . Chronic lower GI bleeding   . Overactive bladder   . Thrombocytopenia     sees Dr. Julien Nordmann   . Splenomegaly   . Depression   . Hypertension   . Asthma   . Chronic headache disorder   . Blood transfusion without reported diagnosis   . Heart murmur   .  Hyperlipidemia   . COPD (chronic obstructive pulmonary disease)     sees Dr. Gwenette Greet   . Anemia     sees Dr. Julien Nordmann, due to chronic disease and GI losses   . Diabetes mellitus     sees Dr. Cruzita Lederer   . Colostomy care   . Hypercalcemia   . 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 Cervical cancer many years ago  . CHF (congestive heart failure)   . History of home oxygen therapy april 2015    uses 2 liter oxygen all the time   . Sleep apnea 2012    mild, no cpap needed  . Complication of anesthesia     woke up during colonscopy and endoscopy in past  . Family history of anesthesia complication     daughter nausea/vomiting   Past Surgical History  Procedure Laterality Date  . Appendectomy    . Cholecystectomy    . Colostomy  11/06/2005  . Pneumonectomy  2004    upper left lobe removed  . Cervix removed    . Left colectomy  11/06/2005    Hartmann resection of sigmoid colon and end colostomy.  . Esophagogastroduodenoscopy  03/19/2011    Procedure: ESOPHAGOGASTRODUODENOSCOPY (EGD);  Surgeon: [name], MD;  Location: [facility];  Service: Endoscopy;  Laterality: N/A;  . Colonoscopy  03/19/2011    Procedure: COLONOSCOPY;  Surgeon: [name], MD;  Location:  [facility];  Service: Endoscopy;  Laterality: N/A;  . Givens capsule study  03/20/2011    Procedure: GIVENS CAPSULE STUDY;  Surgeon: [name], MD;  Location: [facility];  Service: Endoscopy;  Laterality: N/A;  . Small bowel obstruction repair  March 2012  . Umbilical hernia repair  March 2012  . Splenectomy, total N/A 02/24/2013    Procedure: SPLENECTOMY;  Surgeon: Harl Bowie, MD;  Location: [place];  Service: General;  Laterality: N/A;  . Orif patella fracture Left 04/21/2013    DR [name]  . Orif patella Left 04/21/2013    Procedure: OPEN REDUCTION INTERNAL (ORIF) FIXATION PATELLA;  Surgeon: Augustin Schooling, MD;  Location: [place];  Service: Orthopedics;  Laterality: Left;  .  Pacemaker insertion    . Esophagogastroduodenoscopy (egd) with propofol N/A 11/02/2013    Procedure: ESOPHAGOGASTRODUODENOSCOPY (EGD) WITH PROPOFOL;  Surgeon: [name], MD;  Location: [facility];  Service: Endoscopy;  Laterality: N/A;  EGD with APC  . Hot hemostasis N/A 11/02/2013    Procedure: HOT HEMOSTASIS (ARGON PLASMA COAGULATION/BICAP);  Surgeon: [name], MD;  Location: [facility];  Service: Endoscopy;  Laterality: N/A;   Family History  Problem Relation Age of Onset  . Coronary artery disease    . Diabetes type II    . Anesthesia problems Neg Hx   . Hypotension Neg Hx   . Malignant hyperthermia Neg Hx   . Pseudochol deficiency Neg Hx   . Heart attack Mother   . Diabetes type II Mother   . Cirrhosis Father    History  Substance Use Topics  . Smoking status: Former Smoker -- 2.00 packs/day for 35 years    Types: Cigarettes    Quit date: 03/24/2002  . Smokeless tobacco: Never Used  . Alcohol Use: No   OB History   Grav Para Term Preterm Abortions TAB SAB Ect Mult Living                 Review of Systems  Constitutional: Negative for fever and chills.  HENT: Negative.   Respiratory: Positive for shortness of breath. Negative for cough.   Cardiovascular: Negative.   Gastrointestinal: Positive for abdominal pain. Negative for vomiting.  Genitourinary: Negative.  Negative for dysuria.  Musculoskeletal: Negative.   Skin: Negative.   Neurological: Positive for light-headedness. Negative for syncope.      Allergies  Acetaminophen; Morphine; Other; Penicillins; Codeine phosphate; Hydrocodone-acetaminophen; Morphine and related; Trazodone and nefazodone; Cephalexin; and Hydrocodone-acetaminophen  Home Medications   Prior to Admission medications   Medication Sig Start Date End Date Taking? Authorizing Provider  albuterol-ipratropium (COMBIVENT) 18-103 MCG/ACT inhaler Inhale 2 puffs into the lungs every 4 (four) hours as needed for wheezing or shortness  of breath.    Yes Historical Provider, MD  atorvastatin (LIPITOR) 20 MG tablet Take 20 mg by mouth every morning.   Yes Historical Provider, MD  diazepam (VALIUM) 5 MG tablet Take 1 tablet (5 mg total) by mouth 3 (three) times daily as needed for anxiety. 07/29/13  Yes [name], MD  dicyclomine (BENTYL) 10 MG capsule Take 10 mg by mouth 3 (three) times daily.    Yes Historical Provider, MD  esomeprazole (NEXIUM) 40 MG capsule Take 40 mg by mouth every evening.   Yes Historical Provider, MD  Fluticasone-Salmeterol (ADVAIR) 250-50 MCG/DOSE AEPB Inhale 1 puff into the lungs every 12 (twelve) hours. 04/12/13  Yes [name], MD  folic acid (FOLVITE) 1 MG tablet Take 1 tablet (  1 mg total) by mouth every evening. 04/12/13  Yes [name], MD  furosemide (LASIX) 40 MG tablet Take 2 tablets (80 mg total) by mouth 2 (two) times daily. 10/05/13  Yes [name], MD  hydrALAZINE (APRESOLINE) 25 MG tablet Take 1.5 tablets (37.5 mg total) by mouth 2 (two) times daily. 10/13/13  Yes [name], MD  insulin regular human CONCENTRATED (HUMULIN R) 500 UNIT/ML SOLN injection Inject 60-100 Units into the skin 3 (three) times daily with meals. Inject under skin 100 units before b'fast, 60 units before lunch and 60 units before dinner 11/22/13  Yes Philemon Kingdom, MD  INSULIN SYRINGE 1CC/29G (B-D INSULIN SYRINGE) 29G X 1/2" 1 ML MISC Use to inject insulin 3 times daily. 11/23/13  Yes Philemon Kingdom, MD  iron polysaccharides (FERREX 150) 150 MG capsule Take 1 capsule (150 mg total) by mouth 2 (two) times daily. 10/07/13  Yes [name], MD  isosorbide dinitrate (ISORDIL) 20 MG tablet Take 1 tablet (20 mg total) by mouth 2 (two) times daily. 10/13/13  Yes [name], MD  losartan (COZAAR) 50 MG tablet Take 50 mg by mouth every morning.   Yes Historical Provider, MD  methocarbamol (ROBAXIN) 500 MG tablet Take 1 tablet (500 mg total) by mouth every 6 (six) hours as needed for muscle spasms. 05/03/13  Yes Ivan Anchors Love, PA-C  metoCLOPramide (REGLAN) 10 MG tablet Take 1 tablet (10 mg total) by mouth 3 (three) times daily. 04/12/13  Yes [name], MD  Oxycodone HCl 20 MG TABS Take 1 tablet (20 mg total) by mouth every 3 (three) hours as needed (pain). 10/13/13  Yes [name], MD  promethazine (PHENERGAN) 25 MG tablet Take 1 tablet (25 mg total) by mouth every 4 (four) hours as needed for nausea or vomiting. 11/23/13  Yes [name], MD  rifaximin (XIFAXAN) 550 MG TABS tablet Take 550 mg by mouth 2 (two) times daily. 06/29/12  Yes [name], MD  Syringe/Needle, Disp, 25G X 1" 1 ML MISC Use 3x a day 11/22/13  Yes Philemon Kingdom, MD   BP 133/53  Pulse 67  Temp(Src) 98.4 F (36.9 C) (Oral)  Resp 14  SpO2 98% Physical Exam  Constitutional: She is oriented to person, place, and time. She appears well-developed and well-nourished.  HENT:  Head: Normocephalic.  Mouth/Throat: Oropharynx is clear and moist.  Eyes: Conjunctivae are normal.  Neck: Normal range of motion. Neck supple.  Cardiovascular: Normal rate and regular rhythm.   Pulmonary/Chest: Effort normal and breath sounds normal.  Abdominal: Soft. Bowel sounds are normal. There is tenderness. There is no rebound and no guarding.  Generalized abdominal tenderness to soft, non-distended abdomen with positive BS sounds. Colostomy in place, site unremarkable, with non-melanic stool in bag.   Musculoskeletal: Normal range of motion.  Neurological: She is alert and oriented to person, place, and time.  Skin: Skin is warm and dry. No rash noted.  Psychiatric: She has a normal mood and affect.    ED Course  Procedures (including critical care time) Labs Review Labs Reviewed  CBC - Abnormal; Notable for the following:    WBC 13.9 (*)    RBC 3.40 (*)    Hemoglobin 9.4 (*)    HCT 29.1 (*)    Platelets 466 (*)    All other components within normal limits  COMPREHENSIVE METABOLIC PANEL - Abnormal; Notable for the following:     Glucose, Bld 212 (*)  BUN 29 (*)    Calcium 10.6 (*)    Total Bilirubin 0.2 (*)    GFR calc non Af Amer 58 (*)    GFR calc Af Amer 67 (*)    All other components within normal limits  CBG MONITORING, ED - Abnormal; Notable for the following:    Glucose-Capillary 213 (*)    All other components within normal limits  URINALYSIS, ROUTINE W REFLEX MICROSCOPIC  DIFFERENTIAL   Dg Abd Acute W/chest  11/27/2013   CLINICAL DATA:  Shortness of breath and weakness  EXAM: ACUTE ABDOMEN SERIES (ABDOMEN 2 VIEW & CHEST 1 VIEW)  COMPARISON:  09/08/2013  FINDINGS: Pacing device is again noted. The cardiac shadow is within normal limits. The lungs are well aerated. Postsurgical changes are again seen. No focal infiltrate is noted.  The abdomen shows a nonobstructive bowel gas pattern. No free air is seen. Fecal material is noted throughout the colon. Changes consistent with a peristomal hernia are again noted in the left lower quadrant. No acute bony abnormality is seen.  IMPRESSION: Chronic changes without acute abnormality.   Electronically Signed   By: Inez Catalina M.D.   On: 11/27/2013 20:13   Imaging Review No results found.   EKG Interpretation   Date/Time:  Saturday November 27 2013 16:29:20 EDT Ventricular Rate:  78 PR Interval:  171 QRS Duration: 106 QT Interval:  413 QTC Calculation: 470 R Axis:   -7 Text Interpretation:  Sinus rhythm Confirmed by WARD,  DO, KRISTEN ([id number])  on 11/27/2013 4:37:37 PM      MDM   Final diagnoses:  Abdominal pain    1. Hyperglycemia, improved 2. Chronic abdominal pain  The patient appears to be at her baseline. She has chronic abdominal pain that she endorses is the same as her usual. She states she has left arm numbness, also chronic, that seemed worse today but is without weakness or neurologic deficit on exam today. High blood sugar improves without intervention in the ED. Suspect fluctuations in CBG related to recent medication changes and will  require close follow up with her doctor for any needed adjustments. Pain is improved. She is stable for discharge home.     Dewaine Oats, PA-C 11/27/13 2111

## 2013-11-27 NOTE — ED Provider Notes (Signed)
Medical screening examination/treatment/procedure(s) were conducted as a shared visit with non-physician practitioner(s) and myself.  I personally evaluated the patient during the encounter.   EKG Interpretation   Date/Time:  Saturday November 27 2013 16:29:20 EDT Ventricular Rate:  78 PR Interval:  171 QRS Duration: 106 QT Interval:  413 QTC Calculation: 470 R Axis:   -7 Text Interpretation:  Sinus rhythm Confirmed by Shahram Alexopoulos,  DO, Nakari Bracknell ([id number])  on 11/27/2013 4:37:37 PM      Pt is a 63 y.o. F with IDDM who presents to ED with multiple complaints of hyperglycemia, chronic abdominal pain, chronic intermittent LUE numbness.  Exam unremarkable.  Pt well appearing.  Neuro intact.  Workup unremarkable.  Plan to dc home.  [place], DO 11/27/13 2246

## 2013-11-27 NOTE — ED Notes (Signed)
Pts CBG 55. Pt given orange juice and peanut butter and crackers.

## 2013-11-27 NOTE — Discharge Instructions (Signed)

## 2013-11-27 NOTE — ED Notes (Addendum)
PTAR called for elevated cbg of 495.  Pt recently had insulin dosage decreased from 60 units humulin to 20 units humulin.  Pt states she had been feeling numbness to L had x 3 days and lightheaded since 12 pm.  However, at 3 pm she attempted to stand up to go to the bathroom and she became too dizzy and had to sit back down.  At the same time, L arm increased in numbness.  Pt ao x 4, and L arm numbness is improving.  Pt also c/o abdominal pain that she is waiting for CT of abdomen for bleeding from colostomy bag.

## 2013-11-27 NOTE — ED Notes (Signed)
PA aware of pain

## 2013-11-29 ENCOUNTER — Encounter (HOSPITAL_COMMUNITY): Payer: Self-pay

## 2013-11-29 ENCOUNTER — Ambulatory Visit (HOSPITAL_COMMUNITY)
Admission: RE | Admit: 2013-11-29 | Discharge: 2013-11-29 | Disposition: A | Discharge status: Home / Self Care | Payer: Medicare Other | Source: Ambulatory Visit | Attending: Diagnostic Radiology | Admitting: Diagnostic Radiology

## 2013-11-29 DIAGNOSIS — R109 Unspecified abdominal pain: Secondary | ICD-10-CM

## 2013-11-29 DIAGNOSIS — K868 Other specified diseases of pancreas: Secondary | ICD-10-CM | POA: Insufficient documentation

## 2013-11-29 DIAGNOSIS — K9401 Colostomy hemorrhage: Secondary | ICD-10-CM | POA: Insufficient documentation

## 2013-11-29 DIAGNOSIS — K769 Liver disease, unspecified: Secondary | ICD-10-CM | POA: Diagnosis not present

## 2013-11-29 DIAGNOSIS — Y838 Other surgical procedures as the cause of abnormal reaction of the patient, or of later complication, without mention of misadventure at the time of the procedure: Secondary | ICD-10-CM | POA: Diagnosis not present

## 2013-11-29 DIAGNOSIS — R609 Edema, unspecified: Secondary | ICD-10-CM | POA: Insufficient documentation

## 2013-11-29 DIAGNOSIS — Z8589 Personal history of malignant neoplasm of other organs and systems: Secondary | ICD-10-CM | POA: Insufficient documentation

## 2013-11-29 DIAGNOSIS — Z85118 Personal history of other malignant neoplasm of bronchus and lung: Secondary | ICD-10-CM | POA: Insufficient documentation

## 2013-11-29 DIAGNOSIS — Z8541 Personal history of malignant neoplasm of cervix uteri: Secondary | ICD-10-CM | POA: Diagnosis not present

## 2013-11-29 MED ORDER — IOHEXOL 300 MG/ML  SOLN
100.0000 mL | Freq: Once | INTRAMUSCULAR | Status: AC | PRN
  Administered 2013-11-29: 100 mL via INTRAVENOUS

## 2013-11-30 ENCOUNTER — Telehealth: Payer: Self-pay | Admitting: *Deleted

## 2013-11-30 NOTE — Telephone Encounter (Signed)
I am not sure what is going on but let's make sure that she is taking the U500 insulin and not just regular U100 insulin. If she does, then we need to increase the doses: - 22 units before b'fast - 15 units before lunch - 15 units before dinner Please call us back Fri to see how the sugars changed.

## 2013-11-30 NOTE — Telephone Encounter (Signed)
Called pt. She stated that she is really eating well. She cannot understand why her sugars are so high. Yesterday, in the am 95, before lunch 191, after lunch 398 and 330 that evening. This am 260, 518 before lunch and 597 5 min ago (4:58 pm). Pt said she did 12 units before she ate her dinner. She just doesn't know what else to do. Be advised.

## 2013-12-01 NOTE — Telephone Encounter (Signed)
Called pt and asked if she was taking the U500. Pt stated she is. Advised pt to increase her insulin doses to: 22-15-15. Call back on Friday with her sugar readings. Pt understood.

## 2013-12-03 ENCOUNTER — Telehealth: Payer: Self-pay | Admitting: Gastroenterology

## 2013-12-03 NOTE — Telephone Encounter (Signed)
Pt is calling about her CT scan results and Insurance needs Prior Auth for her Doreene Nest med

## 2013-12-07 ENCOUNTER — Telehealth: Payer: Self-pay | Admitting: Family Medicine

## 2013-12-07 NOTE — Telephone Encounter (Signed)
Patient aware the imaging has not been reviewed by her provider.

## 2013-12-07 NOTE — Telephone Encounter (Signed)
[id number] - [place] ([address]), [place] - [place] is requesting re-fill on isosorbide dinitrate (ISORDIL) 20 MG tablet

## 2013-12-07 NOTE — Telephone Encounter (Signed)
Caller name: Myangel Call back number:[phone]   Reason for call:  Pt is calling about CT results. She also states she has had bleeding into her ostomy bag.

## 2013-12-09 ENCOUNTER — Other Ambulatory Visit (HOSPITAL_BASED_OUTPATIENT_CLINIC_OR_DEPARTMENT_OTHER): Payer: Medicare Other

## 2013-12-09 DIAGNOSIS — Z85118 Personal history of other malignant neoplasm of bronchus and lung: Secondary | ICD-10-CM

## 2013-12-09 DIAGNOSIS — D509 Iron deficiency anemia, unspecified: Secondary | ICD-10-CM

## 2013-12-09 LAB — CBC WITH DIFFERENTIAL/PLATELET
BASO%: 0.8 % (ref 0.0–2.0)
Basophils Absolute: 0.2 10*3/uL — ABNORMAL HIGH (ref 0.0–0.1)
EOS%: 2.9 % (ref 0.0–7.0)
Eosinophils Absolute: 0.5 10*3/uL (ref 0.0–0.5)
HCT: 25.9 % — ABNORMAL LOW (ref 34.8–46.6)
HGB: 8.2 g/dL — ABNORMAL LOW (ref 11.6–15.9)
LYMPH%: 13.6 % — AB (ref 14.0–49.7)
MCH: 26.6 pg (ref 25.1–34.0)
MCHC: 31.7 g/dL (ref 31.5–36.0)
MCV: 84.1 fL (ref 79.5–101.0)
MONO#: 2.5 10*3/uL — ABNORMAL HIGH (ref 0.1–0.9)
MONO%: 13.4 % (ref 0.0–14.0)
NEUT#: 12.8 10*3/uL — ABNORMAL HIGH (ref 1.5–6.5)
NEUT%: 69.3 % (ref 38.4–76.8)
PLATELETS: 549 10*3/uL — AB (ref 145–400)
RBC: 3.08 10*6/uL — AB (ref 3.70–5.45)
RDW: 16 % — ABNORMAL HIGH (ref 11.2–14.5)
WBC: 18.5 10*3/uL — ABNORMAL HIGH (ref 3.9–10.3)
lymph#: 2.5 10*3/uL (ref 0.9–3.3)

## 2013-12-09 LAB — IRON AND TIBC CHCC
%SAT: 6 % — ABNORMAL LOW (ref 21–57)
Iron: 24 ug/dL — ABNORMAL LOW (ref 41–142)
TIBC: 414 ug/dL (ref 236–444)
UIBC: 390 ug/dL — ABNORMAL HIGH (ref 120–384)

## 2013-12-09 LAB — TECHNOLOGIST REVIEW

## 2013-12-09 LAB — FERRITIN CHCC: FERRITIN: 17 ng/mL (ref 9–269)

## 2013-12-09 NOTE — Telephone Encounter (Signed)
I left a voice message for pt, the script was printed and given to pt on 10/13/13 during a office visit, she should have plenty of refills. Pt will contact the office if indeed she needs another script.

## 2013-12-10 ENCOUNTER — Telehealth: Payer: Self-pay | Admitting: Internal Medicine

## 2013-12-10 NOTE — Telephone Encounter (Signed)
Called pt back and advised her per Dr Arman Filter note. Pt understood.

## 2013-12-10 NOTE — Telephone Encounter (Signed)
  IIncrease the U500 doses: - 22 >> 25 units before b'fast - 15 >> 18 units before lunch - 15 >> 18 units before dinner Please call us back at the beginning of next week to see how the sugars changed.

## 2013-12-10 NOTE — Telephone Encounter (Signed)
Patient states advised by Dr. Cruzita Lederer to call to provide blood sugars   Today 268 am             530 pm   Sugars have been 300,400,500  Please advise Patient   Thank you   Call back: [phone]

## 2013-12-14 ENCOUNTER — Encounter: Payer: Self-pay | Admitting: Internal Medicine

## 2013-12-14 ENCOUNTER — Ambulatory Visit (INDEPENDENT_AMBULATORY_CARE_PROVIDER_SITE_OTHER): Payer: Medicare Other | Admitting: Internal Medicine

## 2013-12-14 VITALS — BP 152/50 | HR 88 | Ht 60.0 in | Wt 189.0 lb

## 2013-12-14 DIAGNOSIS — Z950 Presence of cardiac pacemaker: Secondary | ICD-10-CM

## 2013-12-14 DIAGNOSIS — I10 Essential (primary) hypertension: Secondary | ICD-10-CM

## 2013-12-14 DIAGNOSIS — I4430 Unspecified atrioventricular block: Secondary | ICD-10-CM

## 2013-12-14 DIAGNOSIS — I200 Unstable angina: Secondary | ICD-10-CM

## 2013-12-14 DIAGNOSIS — R001 Bradycardia, unspecified: Secondary | ICD-10-CM

## 2013-12-14 DIAGNOSIS — I5031 Acute diastolic (congestive) heart failure: Secondary | ICD-10-CM

## 2013-12-14 HISTORY — DX: Presence of cardiac pacemaker: Z95.0

## 2013-12-14 LAB — MDC_IDC_ENUM_SESS_TYPE_INCLINIC
Battery Remaining Longevity: 130.8 mo
Battery Voltage: 3.04 V
Brady Statistic RV Percent Paced: 0.93 %
Lead Channel Impedance Value: 512.5 Ohm
Lead Channel Pacing Threshold Amplitude: 0.5 V
Lead Channel Pacing Threshold Amplitude: 1 V
Lead Channel Pacing Threshold Pulse Width: 0.4 ms
Lead Channel Sensing Intrinsic Amplitude: 12 mV
Lead Channel Sensing Intrinsic Amplitude: 4.8 mV
Lead Channel Setting Pacing Amplitude: 2 V
Lead Channel Setting Pacing Amplitude: 2.5 V
Lead Channel Setting Pacing Pulse Width: 0.4 ms
MDC IDC MSMT LEADCHNL RA PACING THRESHOLD PULSEWIDTH: 0.4 ms
MDC IDC MSMT LEADCHNL RV IMPEDANCE VALUE: 525 Ohm
MDC IDC PG SERIAL: 7648211
MDC IDC SESS DTM: 20151117115149
MDC IDC SET LEADCHNL RV SENSING SENSITIVITY: 2 mV
MDC IDC STAT BRADY RA PERCENT PACED: 44 %

## 2013-12-14 NOTE — Progress Notes (Signed)
HPI [name] returns today for follow-up. She is a very pleasant 63 year old woman with a history of symptomatic sinus node dysfunction, status post permanent pacemaker insertion, also with hypertension. The patient has not been in the hospital since I last saw her. She does note ongoing problems with bleeding from her ostomy site. She is anemic. She has fatigue and tiredness, likely multifactorial, but at least in part related to her anemia. Allergies  Allergen Reactions  . Acetaminophen Other (See Comments)    Cirrhosis of liver  . Morphine Other (See Comments)    REACTION: Lowers BP  . Other Other (See Comments)    AGENT:  Per pt, cannot take blood thinners due to cirrhosis of the liver  . Penicillins Anaphylaxis and Rash  . Codeine Phosphate Other (See Comments)    REACTION: Stomach cramps  . Hydrocodone-Acetaminophen Other (See Comments)    REACTION: hallucinations  . Morphine And Related     Blood pressure drops   . Trazodone And Nefazodone     Cardiac arrythmia - DO NOT USE  . Cephalexin Swelling and Rash  . Hydrocodone-Acetaminophen Other (See Comments)    unknown     Current Outpatient Prescriptions  Medication Sig Dispense Refill  . albuterol-ipratropium (COMBIVENT) 18-103 MCG/ACT inhaler Inhale 2 puffs into the lungs every 4 (four) hours as needed for wheezing or shortness of breath.     [name] atorvastatin (LIPITOR) 20 MG tablet Take 20 mg by mouth every morning.    . diazepam (VALIUM) 5 MG tablet Take 1 tablet (5 mg total) by mouth 3 (three) times daily as needed for anxiety. 90 tablet 5  . dicyclomine (BENTYL) 10 MG capsule Take 10 mg by mouth 3 (three) times daily.     [name] esomeprazole (NEXIUM) 40 MG capsule Take 40 mg by mouth every evening.    . Fluticasone-Salmeterol (ADVAIR) 250-50 MCG/DOSE AEPB Inhale 1 puff into the lungs every 12 (twelve) hours. 096 each 3  . folic acid (FOLVITE) 1 MG tablet Take 1 tablet (1 mg total) by mouth every evening. 90 tablet 3    . furosemide (LASIX) 40 MG tablet Take 2 tablets (80 mg total) by mouth 2 (two) times daily. 120 tablet 3  . hydrALAZINE (APRESOLINE) 25 MG tablet Take 1.5 tablets (37.5 mg total) by mouth 2 (two) times daily. 270 tablet 3  . insulin regular human CONCENTRATED (HUMULIN R) 500 UNIT/ML SOLN injection Inject 60-100 Units into the skin 3 (three) times daily with meals. Inject under skin 100 units before b'fast, 60 units before lunch and 60 units before dinner    . INSULIN SYRINGE 1CC/29G (B-D INSULIN SYRINGE) 29G X 1/2" 1 ML MISC Use to inject insulin 3 times daily. 100 each 3  . iron polysaccharides (FERREX 150) 150 MG capsule Take 1 capsule (150 mg total) by mouth 2 (two) times daily. 60 capsule 3  . isosorbide dinitrate (ISORDIL) 20 MG tablet Take 1 tablet (20 mg total) by mouth 2 (two) times daily. 180 tablet 3  . losartan (COZAAR) 50 MG tablet Take 50 mg by mouth every morning.    . methocarbamol (ROBAXIN) 500 MG tablet Take 1 tablet (500 mg total) by mouth every 6 (six) hours as needed for muscle spasms. 60 tablet 0  . metoCLOPramide (REGLAN) 10 MG tablet Take 1 tablet (10 mg total) by mouth 3 (three) times daily. 270 tablet 3  . Oxycodone HCl 20 MG TABS Take 1 tablet (20 mg total) by mouth every  3 (three) hours as needed (pain). 240 tablet 0  . promethazine (PHENERGAN) 25 MG tablet Take 1 tablet (25 mg total) by mouth every 4 (four) hours as needed for nausea or vomiting. 120 tablet 2  . Syringe/Needle, Disp, 25G X 1" 1 ML MISC Use 3x a day 200 each 3  . rifaximin (XIFAXAN) 550 MG TABS tablet Take 550 mg by mouth 2 (two) times daily.     No current facility-administered medications for this visit.     Past Medical History  Diagnosis Date  . Cirrhosis   . GERD (gastroesophageal reflux disease)   . Cervical disc syndrome     trouble turnng neck at times  . Chronic respiratory failure   . Diverticulitis of colon   . Perforation of colon   . Arthritis   . IBS (irritable bowel syndrome)    . Chronic lower GI bleeding   . Overactive bladder   . Thrombocytopenia     sees Dr. Julien Nordmann   . Splenomegaly   . Depression   . Hypertension   . Asthma   . Chronic headache disorder   . Blood transfusion without reported diagnosis   . Heart murmur   . Hyperlipidemia   . COPD (chronic obstructive pulmonary disease)     sees Dr. Gwenette Greet   . Anemia     sees Dr. Julien Nordmann, due to chronic disease and GI losses   . Diabetes mellitus     sees Dr. Cruzita Lederer   . Colostomy care   . Hypercalcemia   . 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 Cervical cancer many years ago  . CHF (congestive heart failure)   . History of home oxygen therapy april 2015    uses 2 liter oxygen all the time   . Sleep apnea 2012    mild, no cpap needed  . Complication of anesthesia     woke up during colonscopy and endoscopy in past  . Family history of anesthesia complication     daughter nausea/vomiting    ROS:   All systems reviewed and negative except as noted in the HPI.   Past Surgical History  Procedure Laterality Date  . Appendectomy    . Cholecystectomy    . Colostomy  11/06/2005  . Pneumonectomy  2004    upper left lobe removed  . Cervix removed    . Left colectomy  11/06/2005    Hartmann resection of sigmoid colon and end colostomy.  . Esophagogastroduodenoscopy  03/19/2011    Procedure: ESOPHAGOGASTRODUODENOSCOPY (EGD);  Surgeon: [name], MD;  Location: [facility];  Service: Endoscopy;  Laterality: N/A;  . Colonoscopy  03/19/2011    Procedure: COLONOSCOPY;  Surgeon: [name], MD;  Location: [facility];  Service: Endoscopy;  Laterality: N/A;  . Givens capsule study  03/20/2011    Procedure: GIVENS CAPSULE STUDY;  Surgeon: [name], MD;  Location: [facility];  Service: Endoscopy;  Laterality: N/A;  . Small bowel obstruction repair  March 2012  . Umbilical hernia repair  March 2012  . Splenectomy, total N/A 02/24/2013    Procedure:  SPLENECTOMY;  Surgeon: Harl Bowie, MD;  Location: [place];  Service: General;  Laterality: N/A;  . Orif patella fracture Left 04/21/2013    DR [name]  . Orif patella Left 04/21/2013    Procedure: OPEN REDUCTION INTERNAL (ORIF) FIXATION PATELLA;  Surgeon: Augustin Schooling, MD;  Location: [place];  Service: Orthopedics;  Laterality:  Left;  . Pacemaker insertion    . Esophagogastroduodenoscopy (egd) with propofol N/A 11/02/2013    Procedure: ESOPHAGOGASTRODUODENOSCOPY (EGD) WITH PROPOFOL;  Surgeon: [name], MD;  Location: [facility];  Service: Endoscopy;  Laterality: N/A;  EGD with APC  . Hot hemostasis N/A 11/02/2013    Procedure: HOT HEMOSTASIS (ARGON PLASMA COAGULATION/BICAP);  Surgeon: [name], MD;  Location: [facility];  Service: Endoscopy;  Laterality: N/A;     Family History  Problem Relation Age of Onset  . Coronary artery disease    . Diabetes type II    . Anesthesia problems Neg Hx   . Hypotension Neg Hx   . Malignant hyperthermia Neg Hx   . Pseudochol deficiency Neg Hx   . Heart attack Mother   . Diabetes type II Mother   . Cirrhosis Father      History   Social History  . Marital Status: Divorced    Spouse Name: N/A    Number of Children: N/A  . Years of Education: N/A   Occupational History  . Disabled    Social History Main Topics  . Smoking status: Former Smoker -- 2.00 packs/day for 35 years    Types: Cigarettes    Quit date: 03/24/2002  . Smokeless tobacco: Never Used  . Alcohol Use: No  . Drug Use: No  . Sexual Activity: No   Other Topics Concern  . Not on file   Social History Narrative   Regular exercise: a little   Caffeine use: 2 cups of coffee in am           BP 152/50 mmHg  Pulse 88  Ht 5' (1.524 m)  Wt 189 lb (85.73 kg)  BMI 36.91 kg/m2  SpO2 99%  Physical Exam:  Chronically ill appearing NAD HEENT: Unremarkable Neck:  No JVD, no thyromegally Back:  No CVA tenderness Lungs:  Clear with no wheezes, rales, or  rhonchi. HEART:  Regular rate rhythm, no murmurs, no rubs, no clicks Abd:  soft, positive bowel sounds, no organomegally, no rebound, no guarding Ext:  2 plus pulses, no edema, no cyanosis, no clubbing Skin:  No rashes no nodules Neuro:  CN II through XII intact, motor grossly intact   DEVICE  Normal device function.  See PaceArt for details.   Assess/Plan:

## 2013-12-14 NOTE — Assessment & Plan Note (Signed)
Her [employer] dual-chamber pacemaker is working normally. We'll plan to recheck in several months. 

## 2013-12-14 NOTE — Patient Instructions (Addendum)
Your physician wants you to follow-up in: 9 months with Dr. Knox Saliva will receive a reminder letter in the mail two months in advance. If you don't receive a letter, please call our office to schedule the follow-up appointment.   Remote monitoring is used to monitor your Pacemaker or ICD from home. This monitoring reduces the number of office visits required to check your device to one time per year. It allows [place] to keep an eye on the functioning of your device to ensure it is working properly. You are scheduled for a device check from home on 03/16/14. You may send your transmission at any time that day. If you have a wireless device, the transmission will be sent automatically. After your physician reviews your transmission, you will receive a postcard with your next transmission date.

## 2013-12-14 NOTE — Assessment & Plan Note (Signed)
Her systolic blood pressure is elevated today. She states that she has been under increased stress. She denies medical or dietary compliance problems. She will undergo watchful waiting. If her blood pressure remains elevated, she will need an adjustment with her blood pressure medications.

## 2013-12-15 ENCOUNTER — Telehealth: Payer: Self-pay | Admitting: Family Medicine

## 2013-12-15 MED ORDER — LEVOFLOXACIN 500 MG PO TABS: 500.0000 mg | ORAL_TABLET | Freq: Every day | ORAL | Status: DC

## 2013-12-15 NOTE — Telephone Encounter (Signed)
Call in Levaquin 500 mg daily for 10 days  

## 2013-12-15 NOTE — Telephone Encounter (Signed)
Patient Information:  Caller Name: Daana  Phone: [phone]  Patient: [name]  Gender: Female  DOB: January 25, 1951  Age: 63 Years  PCP: Alysia Penna [facility])  Office Follow Up:  Does the office need to follow up with this patient?: Yes  Instructions For The Office: Please review.  Asking if antibiotic can be called into [facility]  [phone]. Contact patient at [phone] - let her know about Rx and will need to make appointment for Monday 11/23 today if she is to be seen.   Symptoms  Reason For Call & Symptoms: Fevers at intervals for 1 week.  Cough started yesterday 11/17, coughing up yellow mucus.  Recent exposure to relative with pheumonia.  Is just not feeling well at all today.  Saw cardiologist yesterday and was told lungs were clear but asked her to call Dr Sarajane Jews.  Is to see Dr Mckinley Jewel on 11/19 to discuss last CT Scan.  Feels congested, feels like she does when she needs antibiotic but unable to get to office without 3 days notice -- if makes appointment for Monday 11/23, will need to request transportation today to be able to get to office.  Reviewed Health History In EMR: Yes  Reviewed Medications In EMR: Yes  Reviewed Allergies In EMR: Yes  Reviewed Surgeries / Procedures: Yes  Date of Onset of Symptoms: 12/14/2013  Any Fever: Yes  Fever Taken: Oral  Fever Time Of Reading: 09:30:00  Fever Last Reading: 99.9  Guideline(s) Used:  Cough  Disposition Per Guideline:   Go to Office Now  Reason For Disposition Reached:   Increasing ankle swelling  Advice Given:  Coughing Spasms:  Drink warm fluids. Inhale warm mist (Reason: both relax the airway and loosen up the phlegm).  Prevent Dehydration:  Drink adequate liquids.  This will help soothe an irritated or dry throat and loosen up the phlegm.  Call Back If:  Difficulty breathing  You become worse.  Patient Refused Recommendation:  Patient Requests Prescription  Unable to get to office until  next week.

## 2013-12-15 NOTE — Telephone Encounter (Signed)
Pt is aware of Dr. Barbie Banner recommendations and rx sent to [facility].

## 2013-12-16 ENCOUNTER — Ambulatory Visit (HOSPITAL_BASED_OUTPATIENT_CLINIC_OR_DEPARTMENT_OTHER): Payer: Medicare Other | Admitting: Internal Medicine

## 2013-12-16 ENCOUNTER — Ambulatory Visit (HOSPITAL_BASED_OUTPATIENT_CLINIC_OR_DEPARTMENT_OTHER): Payer: Medicare Other

## 2013-12-16 ENCOUNTER — Encounter: Payer: Self-pay | Admitting: Internal Medicine

## 2013-12-16 ENCOUNTER — Telehealth: Payer: Self-pay | Admitting: Internal Medicine

## 2013-12-16 VITALS — BP 144/42 | HR 88 | Temp 98.6°F | Resp 17 | Ht 60.0 in | Wt 194.0 lb

## 2013-12-16 DIAGNOSIS — D696 Thrombocytopenia, unspecified: Secondary | ICD-10-CM

## 2013-12-16 DIAGNOSIS — R0602 Shortness of breath: Secondary | ICD-10-CM

## 2013-12-16 DIAGNOSIS — Z85118 Personal history of other malignant neoplasm of bronchus and lung: Secondary | ICD-10-CM

## 2013-12-16 DIAGNOSIS — D500 Iron deficiency anemia secondary to blood loss (chronic): Secondary | ICD-10-CM

## 2013-12-16 DIAGNOSIS — D509 Iron deficiency anemia, unspecified: Secondary | ICD-10-CM

## 2013-12-16 MED ORDER — FERUMOXYTOL INJECTION 510 MG/17 ML: 510.0000 mg | Freq: Once | INTRAVENOUS | Status: DC

## 2013-12-16 MED ORDER — SODIUM CHLORIDE 0.9 % IV SOLN
Freq: Once | INTRAVENOUS | Status: AC
  Administered 2013-12-16: 14:00:00 via INTRAVENOUS

## 2013-12-16 MED ORDER — HEPARIN SOD (PORK) LOCK FLUSH 100 UNIT/ML IV SOLN
500.0000 [IU] | Freq: Once | INTRAVENOUS | Status: DC | PRN
  Filled 2013-12-16: qty 5

## 2013-12-16 MED ORDER — SODIUM CHLORIDE 0.9 % IV SOLN
510.0000 mg | Freq: Once | INTRAVENOUS | Status: AC
  Administered 2013-12-16: 510 mg via INTRAVENOUS
  Filled 2013-12-16: qty 17

## 2013-12-16 NOTE — Progress Notes (Signed)
[place] Telephone:([phone]) [phone]   Fax:([phone]  OFFICE PROGRESS NOTE  Catherine Morale, MD [place] [address]  DIAGNOSIS:  1) stage IB non-small cell lung cancer diagnosed in April 2004 status post left upper lobectomy by Dr. Roxan Hockey.  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  3) anemia of chronic disease plus/minus deficiency secondary to GI bleed.  4) hypercalcemia most likely secondary to excessive vitamin D intake.  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 Ninfa Linden.  CURRENT THERAPY:  Feraheme infusion 510 mg IV weekly x 2 doses. First dose today 12/16/2013.  INTERVAL HISTORY: [name] 63 y.o. female returns to the clinic today for followup visit. She continues to complain of persistent fatigue and bleeding from the colostomy bag.  She continues to have increasing fatigue. She also has intermittent bleeding from the ostomy. She denied having any other significant complaints except for the fatigue. She denied and specifically no nausea or vomiting, no fever or chills. She denied having any significant weight loss or night sweats. She has no chest pain but continues to have shortness of breath with exertion. She has no other bleeding, bruises or ecchymosis. She had repeat CBC and iron study performed recently and she is here for evaluation and discussion of her lab results.    MEDICAL HISTORY: Past Medical History  Diagnosis Date  . Cirrhosis   . GERD (gastroesophageal reflux disease)   . Cervical disc syndrome     trouble turnng neck at times  . Chronic respiratory failure   . Diverticulitis of colon   . Perforation of colon   . Arthritis   . IBS (irritable bowel syndrome)   . Chronic lower GI bleeding   . Overactive bladder   . Thrombocytopenia     sees Dr. Julien Nordmann   . Splenomegaly   . Depression   . Hypertension   . Asthma   . Chronic headache  disorder   . Blood transfusion without reported diagnosis   . Heart murmur   . Hyperlipidemia   . COPD (chronic obstructive pulmonary disease)     sees Dr. Gwenette Greet   . Anemia     sees Dr. Julien Nordmann, due to chronic disease and GI losses   . Diabetes mellitus     sees Dr. Cruzita Lederer   . Colostomy care   . Hypercalcemia   . 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 Cervical cancer many years ago  . CHF (congestive heart failure)   . History of home oxygen therapy april 2015    uses 2 liter oxygen all the time   . Sleep apnea 2012    mild, no cpap needed  . Complication of anesthesia     woke up during colonscopy and endoscopy in past  . Family history of anesthesia complication     daughter nausea/vomiting  . Pacemaker 12/14/2013    ALLERGIES:  is allergic to acetaminophen; morphine; other; penicillins; codeine phosphate; hydrocodone-acetaminophen; morphine and related; trazodone and nefazodone; cephalexin; and hydrocodone-acetaminophen.  MEDICATIONS:  Current Outpatient Prescriptions  Medication Sig Dispense Refill  . albuterol-ipratropium (COMBIVENT) 18-103 MCG/ACT inhaler Inhale 2 puffs into the lungs every 4 (four) hours as needed for wheezing or shortness of breath.     [name] atorvastatin (LIPITOR) 20 MG tablet Take 20 mg by mouth every morning.    . diazepam (VALIUM) 5 MG tablet Take 1 tablet (  5 mg total) by mouth 3 (three) times daily as needed for anxiety. 90 tablet 5  . dicyclomine (BENTYL) 10 MG capsule Take 10 mg by mouth 3 (three) times daily.     [name] esomeprazole (NEXIUM) 40 MG capsule Take 40 mg by mouth every evening.    . Fluticasone-Salmeterol (ADVAIR) 250-50 MCG/DOSE AEPB Inhale 1 puff into the lungs every 12 (twelve) hours. 588 each 3  . folic acid (FOLVITE) 1 MG tablet Take 1 tablet (1 mg total) by mouth every evening. 90 tablet 3  . furosemide (LASIX) 40 MG tablet Take 2 tablets (80 mg total) by mouth 2 (two) times daily. 120 tablet 3  . hydrALAZINE  (APRESOLINE) 25 MG tablet Take 1.5 tablets (37.5 mg total) by mouth 2 (two) times daily. 270 tablet 3  . insulin regular human CONCENTRATED (HUMULIN R) 500 UNIT/ML SOLN injection Inject 60-100 Units into the skin 3 (three) times daily with meals. Inject under skin 100 units before b'fast, 60 units before lunch and 60 units before dinner    . INSULIN SYRINGE 1CC/29G (B-D INSULIN SYRINGE) 29G X 1/2" 1 ML MISC Use to inject insulin 3 times daily. 100 each 3  . iron polysaccharides (FERREX 150) 150 MG capsule Take 1 capsule (150 mg total) by mouth 2 (two) times daily. 60 capsule 3  . isosorbide dinitrate (ISORDIL) 20 MG tablet Take 1 tablet (20 mg total) by mouth 2 (two) times daily. 180 tablet 3  . levofloxacin (LEVAQUIN) 500 MG tablet Take 1 tablet (500 mg total) by mouth daily. 10 tablet 0  . losartan (COZAAR) 50 MG tablet Take 50 mg by mouth every morning.    . methocarbamol (ROBAXIN) 500 MG tablet Take 1 tablet (500 mg total) by mouth every 6 (six) hours as needed for muscle spasms. 60 tablet 0  . metoCLOPramide (REGLAN) 10 MG tablet Take 1 tablet (10 mg total) by mouth 3 (three) times daily. 270 tablet 3  . Oxycodone HCl 20 MG TABS Take 1 tablet (20 mg total) by mouth every 3 (three) hours as needed (pain). 240 tablet 0  . promethazine (PHENERGAN) 25 MG tablet Take 1 tablet (25 mg total) by mouth every 4 (four) hours as needed for nausea or vomiting. 120 tablet 2  . rifaximin (XIFAXAN) 550 MG TABS tablet Take 550 mg by mouth 2 (two) times daily.    . Syringe/Needle, Disp, 25G X 1" 1 ML MISC Use 3x a day 200 each 3   No current facility-administered medications for this visit.    SURGICAL HISTORY:  Past Surgical History  Procedure Laterality Date  . Appendectomy    . Cholecystectomy    . Colostomy  11/06/2005  . Pneumonectomy  2004    upper left lobe removed  . Cervix removed    . Left colectomy  11/06/2005    Hartmann resection of sigmoid colon and end colostomy.  .  Esophagogastroduodenoscopy  03/19/2011    Procedure: ESOPHAGOGASTRODUODENOSCOPY (EGD);  Surgeon: [name], MD;  Location: [facility];  Service: Endoscopy;  Laterality: N/A;  . Colonoscopy  03/19/2011    Procedure: COLONOSCOPY;  Surgeon: [name], MD;  Location: [facility];  Service: Endoscopy;  Laterality: N/A;  . Givens capsule study  03/20/2011    Procedure: GIVENS CAPSULE STUDY;  Surgeon: [name], MD;  Location: [facility];  Service: Endoscopy;  Laterality: N/A;  . Small bowel obstruction repair  March 2012  . Umbilical hernia repair  March 2012  . Splenectomy, total  N/A 02/24/2013    Procedure: SPLENECTOMY;  Surgeon: Harl Bowie, MD;  Location: [place];  Service: General;  Laterality: N/A;  . Orif patella fracture Left 04/21/2013    DR [name]  . Orif patella Left 04/21/2013    Procedure: OPEN REDUCTION INTERNAL (ORIF) FIXATION PATELLA;  Surgeon: Augustin Schooling, MD;  Location: [place];  Service: Orthopedics;  Laterality: Left;  . Pacemaker insertion    . Esophagogastroduodenoscopy (egd) with propofol N/A 11/02/2013    Procedure: ESOPHAGOGASTRODUODENOSCOPY (EGD) WITH PROPOFOL;  Surgeon: [name], MD;  Location: [facility];  Service: Endoscopy;  Laterality: N/A;  EGD with APC  . Hot hemostasis N/A 11/02/2013    Procedure: HOT HEMOSTASIS (ARGON PLASMA COAGULATION/BICAP);  Surgeon: [name], MD;  Location: [facility];  Service: Endoscopy;  Laterality: N/A;    REVIEW OF SYSTEMS:  Constitutional: positive for fatigue Eyes: negative Ears, nose, mouth, throat, and face: negative Respiratory: positive for dyspnea on exertion Cardiovascular: negative Gastrointestinal: positive for Bleeding from the ostomy site. Genitourinary:negative Integument/breast: negative Hematologic/lymphatic: Anemia and low platelets Musculoskeletal:negative Neurological: negative Behavioral/Psych: negative Allergic/Immunologic: negative   PHYSICAL EXAMINATION: General  appearance: alert, cooperative, fatigued and no distress Head: Normocephalic, without obvious abnormality, atraumatic Neck: no adenopathy and no JVD Lymph nodes: Cervical, supraclavicular, and axillary nodes normal. Resp: clear to auscultation bilaterally Cardio: regular rate and rhythm, S1, S2 normal, no murmur, click, rub or gallop GI: soft, non-tender; bowel sounds normal; no masses,  no organomegaly Extremities: extremities normal, atraumatic, no cyanosis or edema Neurologic: Alert and oriented X 3, normal strength and tone. Normal symmetric reflexes. Normal coordination and gait  ECOG PERFORMANCE STATUS: 2 - Symptomatic, <50% confined to bed  Blood pressure 144/42, pulse 88, temperature 98.6 F (37 C), temperature source Oral, resp. rate 17, height 5' (1.524 m), weight 194 lb (87.998 kg), SpO2 99 %, peak flow 2 L/min.  LABORATORY DATA: Lab Results  Component Value Date   WBC 18.5* 12/09/2013   HGB 8.2* 12/09/2013   HCT 25.9* 12/09/2013   MCV 84.1 12/09/2013   PLT 549* 12/09/2013      Chemistry      Component Value Date/Time   NA 137 11/27/2013 1651   NA 138 06/23/2013 0806   K 3.8 11/27/2013 1651   K 4.7 06/23/2013 0806   CL 96 11/27/2013 1651   CL 103 03/23/2012 0938   CO2 28 11/27/2013 1651   CO2 21* 06/23/2013 0806   BUN 29* 11/27/2013 1651   BUN 21.7 06/23/2013 0806   CREATININE 1.02 11/27/2013 1651   CREATININE 1.1 06/23/2013 0806      Component Value Date/Time   CALCIUM 10.6* 11/27/2013 1651   CALCIUM 10.3 06/23/2013 0806   ALKPHOS 109 11/27/2013 1651   ALKPHOS 138 06/23/2013 0806   AST 25 11/27/2013 1651   AST 43* 06/23/2013 0806   ALT 18 11/27/2013 1651   ALT 49 06/23/2013 0806   BILITOT 0.2* 11/27/2013 1651   BILITOT 0.31 06/23/2013 0806       RADIOGRAPHIC STUDIES: BONE MARROW REPORT ASSESSMENT AND PLAN:  1) severe iron deficiency anemia secondary to gastrointestinal blood loss from the ostomy. The patient is currently on oral iron tablets but  it is not effective. I will arrange for the patient to have Feraheme infusion 510 mg weekly x2 doses. I would see her back for follow up visit in 2 months after repeating CBC and iron study. She'll also continue her routine followup visit with her gastroenterologist for evaluation of her  persistent GI bleed.  2) Thrombocytopenia: Her thrombocytopenia has completely resolved after the splenectomy. She actually has thrombocytosis secondary to iron deficiency anemia.  3) History of stage IB non-small cell lung cancer diagnosed in 2014. Continue on observation.  She was advised to call immediately if she has any concerning symptoms in the interval. The patient voices understanding of current disease status and treatment options and is in agreement with the current care plan.  All questions were answered. The patient knows to call the clinic with any problems, questions or concerns. We can certainly see the patient much sooner if necessary.  Disclaimer: This note was dictated with voice recognition software. Similar sounding words can inadvertently be transcribed and may not be corrected upon review.

## 2013-12-16 NOTE — Patient Instructions (Signed)
Ferumoxytol injection What is this medicine? FERUMOXYTOL is an iron complex. Iron is used to make healthy red blood cells, which carry oxygen and nutrients throughout the body. This medicine is used to treat iron deficiency anemia in people with chronic kidney disease. This medicine may be used for other purposes; ask your health care provider or pharmacist if you have questions. What should I tell my health care provider before I take this medicine? They need to know if you have any of these conditions: -anemia not caused by low iron levels -high levels of iron in the blood -magnetic resonance imaging (MRI) test scheduled -an unusual or allergic reaction to iron, other medicines, foods, dyes, or preservatives -pregnant or trying to get pregnant -breast-feeding How should I use this medicine? This medicine is for infusion into a vein. It is given by a health care professional in a hospital or clinic setting. Talk to your pediatrician regarding the use of this medicine in children. Special care may be needed. Overdosage: If you think you've taken too much of this medicine contact a poison control center or emergency room at once. Overdosage: If you think you have taken too much of this medicine contact a poison control center or emergency room at once. NOTE: This medicine is only for you. Do not share this medicine with others. What if I miss a dose? It is important not to miss your dose. Call your doctor or health care professional if you are unable to keep an appointment. What may interact with this medicine? This medicine may interact with the following medications: -other iron products This list may not describe all possible interactions. Give your health care provider a list of all the medicines, herbs, non-prescription drugs, or dietary supplements you use. Also tell them if you smoke, drink alcohol, or use illegal drugs. Some items may interact with your medicine. What should I watch  for while using this medicine? Visit your doctor or healthcare professional regularly. Tell your doctor or healthcare professional if your symptoms do not start to get better or if they get worse. You may need blood work done while you are taking this medicine. You may need to follow a special diet. Talk to your doctor. Foods that contain iron include: whole grains/cereals, dried fruits, beans, or peas, leafy green vegetables, and organ meats (liver, kidney). What side effects may I notice from receiving this medicine? Side effects that you should report to your doctor or health care professional as soon as possible: -allergic reactions like skin rash, itching or hives, swelling of the face, lips, or tongue -breathing problems -changes in blood pressure -feeling faint or lightheaded, falls -fever or chills -flushing, sweating, or hot feelings -swelling of the ankles or feet Side effects that usually do not require medical attention (Report these to your doctor or health care professional if they continue or are bothersome.): -diarrhea -headache -nausea, vomiting -stomach pain This list may not describe all possible side effects. Call your doctor for medical advice about side effects. You may report side effects to FDA at 1-800-FDA-1088. Where should I keep my medicine? This drug is given in a hospital or clinic and will not be stored at home. NOTE: This sheet is a summary. It may not cover all possible information. If you have questions about this medicine, talk to your doctor, pharmacist, or health care provider.  2012, Elsevier/Gold Standard. (10/07/2007 9:48:25 PM) 

## 2013-12-16 NOTE — Telephone Encounter (Signed)
added pt appt.[name]..Cherylann Banas is printing and giving it to pt in lobby....waiting on tonya to add iron for today...Kenney Houseman will notify pt

## 2013-12-21 ENCOUNTER — Other Ambulatory Visit: Payer: Self-pay | Admitting: Internal Medicine

## 2013-12-21 NOTE — Telephone Encounter (Signed)
Please read note below and advise.  

## 2013-12-21 NOTE — Telephone Encounter (Signed)
[name], Please change the instructions to reflect the latest dose changes.

## 2013-12-21 NOTE — Telephone Encounter (Signed)
Patient stated that the higher dose is not working, her b/s is running Into the 300 to 440. She need a new prescription. Please call her

## 2013-12-21 NOTE — Telephone Encounter (Signed)
Patient would like her Humulin R called into her pharmacy   Pharmacy: [place]    Thank you

## 2013-12-21 NOTE — Telephone Encounter (Signed)
Pt requesting Humulin refill due to changes in insulin. Please guide to dosage.

## 2013-12-21 NOTE — Telephone Encounter (Signed)
Increase the U500 doses: - 22 >> 25 >> 28 units before b'fast - 15 >> 18 >> 22 units before lunch - 15 >> 18 >> 20 units before dinner Please call us back at the beginning of next week to see how the sugars changed. I need details about when her sugars are high to change the doses... She can also increase the doses by ~2 units if still has high sugars after this change and NO lows.

## 2013-12-21 NOTE — Telephone Encounter (Signed)
Called pt and advised her per Dr Arman Filter note. Pt understood. Pt has appt scheduled on Tues, Dec. 1. Be advised.

## 2013-12-22 ENCOUNTER — Other Ambulatory Visit: Payer: Self-pay | Admitting: *Deleted

## 2013-12-22 MED ORDER — INSULIN REGULAR HUMAN (CONC) 500 UNIT/ML ~~LOC~~ SOLN: 100.0000 [IU] | Freq: Three times a day (TID) | SUBCUTANEOUS | Status: DC

## 2013-12-22 MED ORDER — INSULIN REGULAR HUMAN (CONC) 500 UNIT/ML ~~LOC~~ SOLN: SUBCUTANEOUS | Status: DC

## 2013-12-24 ENCOUNTER — Ambulatory Visit (HOSPITAL_BASED_OUTPATIENT_CLINIC_OR_DEPARTMENT_OTHER): Payer: Medicare Other

## 2013-12-24 DIAGNOSIS — D509 Iron deficiency anemia, unspecified: Secondary | ICD-10-CM

## 2013-12-24 DIAGNOSIS — D500 Iron deficiency anemia secondary to blood loss (chronic): Secondary | ICD-10-CM

## 2013-12-24 MED ORDER — FERUMOXYTOL INJECTION 510 MG/17 ML: 510.0000 mg | Freq: Once | INTRAVENOUS | Status: DC

## 2013-12-24 MED ORDER — SODIUM CHLORIDE 0.9 % IV SOLN
510.0000 mg | Freq: Once | INTRAVENOUS | Status: AC
  Administered 2013-12-24: 510 mg via INTRAVENOUS
  Filled 2013-12-24: qty 17

## 2013-12-24 MED ORDER — ALTEPLASE 2 MG IJ SOLR
2.0000 mg | Freq: Once | INTRAMUSCULAR | Status: DC | PRN
  Filled 2013-12-24: qty 2

## 2013-12-24 MED ORDER — SODIUM CHLORIDE 0.9 % IV SOLN: Freq: Once | INTRAVENOUS | Status: DC

## 2013-12-24 NOTE — Patient Instructions (Signed)

## 2013-12-28 ENCOUNTER — Ambulatory Visit (INDEPENDENT_AMBULATORY_CARE_PROVIDER_SITE_OTHER): Payer: Medicare Other | Admitting: Internal Medicine

## 2013-12-28 ENCOUNTER — Encounter: Payer: Self-pay | Admitting: Internal Medicine

## 2013-12-28 VITALS — BP 121/58 | HR 100 | Temp 97.9°F | Wt 197.0 lb

## 2013-12-28 DIAGNOSIS — E1165 Type 2 diabetes mellitus with hyperglycemia: Secondary | ICD-10-CM

## 2013-12-28 DIAGNOSIS — I200 Unstable angina: Secondary | ICD-10-CM

## 2013-12-28 DIAGNOSIS — IMO0002 Reserved for concepts with insufficient information to code with codable children: Secondary | ICD-10-CM

## 2013-12-28 DIAGNOSIS — E1140 Type 2 diabetes mellitus with diabetic neuropathy, unspecified: Secondary | ICD-10-CM

## 2013-12-28 MED ORDER — METFORMIN HCL 500 MG PO TABS: 500.0000 mg | ORAL_TABLET | Freq: Two times a day (BID) | ORAL | Status: DC

## 2013-12-28 MED ORDER — INSULIN REGULAR HUMAN (CONC) 500 UNIT/ML ~~LOC~~ SOLN: SUBCUTANEOUS | Status: DC

## 2013-12-28 NOTE — Progress Notes (Signed)
Patient ID: [name], female   DOB: 1950/07/26, 63 y.o.   MRN: [id number]  HPI: [name] is a 63 y.o.-year-old female, returns for f/u for DM2, dx 2004, insulin-dependent since 2006/7 - then off in 2009 - then restarted 08/2012, uncontrolled, with complications (PN, admission for [facility] on 11/14/2012). Last visit 1  mo ago.  She still has rectal bleeding (clots) - off and on. She continues to feel very fatigued. She had 2 iron infusions since last visit with me.  Last hemoglobin A1c was: Office Visit on 11/22/2013  Component Date Value Ref Range Status  . Fructosamine - Calculated HbA1c is 7.7%. 11/22/2013 360 190 - 270 umol/L Final   Lab Results  Component Value Date   HGBA1C 6.8 07/29/2013   HGBA1C 6.9 04/22/2013   HGBA1C 8.6 11/14/2012   Pt was on a regimen of: - Lantus 40-30 units - pen  - Humalog 45-35-40 >> 55-45-45 units with B-L-D- pens  Copay $0 for her insulins.  She is now on U500 insulin: - 28-22-20 with B-L-D  Pt checks her sugars 4-6x a day and they are still high, but improved after last increase: - am: 150-230 >> 101-260 >> 106-322 >>  87-104 >> 95-180, but in last week, also 200s and even 310 and 320 >> 123-260, 312 - 2h after b'fast: 300-499 >> 311-402 - highest sugars of the day >> 275-377 >> 189-200 >> n/c - before lunch: 247-480 >> 221-407 >> 182-381 >> 190-200 >> 216-350 >> 119-404, 445 - 2h after lunch: n/c >> 160 x 1 check >> 246- 302 >> n/c - before dinner: 231-504 >> 83-328 - 130-448 >> 67, 140-150 >> 160-309, 401 >> 119, 273-445 - bedtime: 280-480 >> 104-435 >> 220-372 >> 104-105 >> 150-330, 416 >> 265-398 No lows; she has hypoglycemia awareness at 70-80. Highest sugar was 400s.  - + CKD, last BUN/creatinine:  Lab Results  Component Value Date   BUN 29* 11/27/2013   Lab Results  Component Value Date   CREATININE 1.02 11/27/2013   - last eye exam was in 04/2012. No DR.  - + numbness and tingling in her feet.  She also has a history of  Squamous cell lung cancer, hypertension, COPD, GERD, depression, anemia and thrombocytopenia, cirrhosis, IBS. She had a BM Bx  >> no malignancy. She had splenectomy for severe splenomegaly (02/24/2013) >> this was a difficult surgery. Had severe blood loss.   I reviewed pt's medications, allergies, PMH, social hx, family hx and no changes required, except as mentioned above.  ROS: Constitutional: + weight gain, + fatigue, no subjective hypothermia, + poor sleep, + nocturia Eyes: no blurry vision, no xerophthalmia ENT: no sore throat, no nodules palpated in throat, no dysphagia/odynophagia, no hoarseness, + decreased hearing Cardiovascular: no CP/SOB/palpitations/leg swelling Respiratory: + cough/no SOB Gastrointestinal: + N/no V/+ D/no C Musculoskeletal: + muscle/+ joint aches Skin: no rashes, + easy bruising, + itching, + hair loss Neurological: +  Tremors/no numbness/tingling/dizziness, + HA  PE: BP 121/58 mmHg  Pulse 100  Temp(Src) 97.9 F (36.6 C)  Wt 197 lb (89.359 kg)  SpO2 96% Body mass index is 38.47 kg/(m^2).  Wt Readings from Last 3 Encounters:  12/28/13 197 lb (89.359 kg)  12/16/13 194 lb (87.998 kg)  12/14/13 189 lb (85.73 kg)   Constitutional: obese, pale, in NAD, on O2 Eyes: PERRLA, EOMI, no exophthalmos ENT: moist mucous membranes, no thyromegaly, no cervical lymphadenopathy Cardiovascular: RRR, No MRG Respiratory: CTA B Gastrointestinal: abdomen soft, NT, ND,  BS+ Musculoskeletal: no deformities, strength intact in all 4 Skin: moist, warm  ASSESSMENT: 1. DM2, insulin-dependent, uncontrolled, with complications - PN - h/o HHNK admission  PLAN:  1. Patient with long-standing, uncontrolled diabetes, on U500 insulin tx. Sugars are still high per review of her log - unclear reason. She has higher sugars as the day goes by. - I suggested to increase U500 insulin and restart Metformin. Reviewed recent BUN/Cr and LFTs >> improved:  Patient Instructions  Please  increase U500 insulin as follows: - 0.28 >> 0.30 mL (draw up to 30 units on the syringe) 30 min before breakfast - 0.22 >> 0.25 mL (draw up to 25 units on the syringe) 30 min before lunch - 0.20 >> 0.22 mL (draw up to 22 units on the syringe) 30 min before dinner Start Metformin 500 mg 2x a day. Please let me know if the sugars are consistently <80 or >200. Please return in 1-1.5 months with your sugar log.   - we need to check fructosamine rather than HbA1c levels in the future (see HPI) - advised to get a new eye exam >> she is due - continue checking sugars at different times of the day - check 4 times a day, rotating checks- I gave her new logs - Return to clinic in 1.5 mo with sugar log

## 2013-12-28 NOTE — Progress Notes (Signed)
Pre visit review using our clinic review tool, if applicable. No additional management support is needed unless otherwise documented below in the visit note. 

## 2013-12-28 NOTE — Patient Instructions (Addendum)
Please increase U500 insulin as follows: - 0.28 >> 0.30 mL (draw up to 30 units on the syringe) 30 min before breakfast - 0.22 >> 0.25 mL (draw up to 25 units on the syringe) 30 min before lunch - 0.20 >> 0.22 mL (draw up to 22 units on the syringe) 30 min before dinner Start Metformin 500 mg 2x a day. Please let me know if the sugars are consistently <80 or >200. Please return in 1-1.5 months with your sugar log.

## 2013-12-30 ENCOUNTER — Telehealth: Payer: Self-pay | Admitting: Family Medicine

## 2013-12-30 ENCOUNTER — Telehealth: Payer: Self-pay | Admitting: Gastroenterology

## 2013-12-30 DIAGNOSIS — R1084 Generalized abdominal pain: Secondary | ICD-10-CM

## 2013-12-30 NOTE — Telephone Encounter (Signed)
Per [name], pt can schedule to see [place], however if she gets worse then she will need to call EMS and go to the ER. I spoke with pt and went over this information.

## 2013-12-30 NOTE — Telephone Encounter (Signed)
Pt would like referral to a different GI doctor. Pt states dr [name] cannot see her until Jan.don't even know when. Pt states she cannot wait that long.   This referral was made and pt has not even seen this doctor from the referral in oct. Pt is have extreme diarrhea w/ pain. Bleeding off and on from stoma. Pt would like someone w/ a good bedside manner. Pt needs appt asap,please. Pt was upset and crying over this matter.

## 2013-12-30 NOTE — Telephone Encounter (Signed)
Spoke with patient. She is concerned about her ongoing problems and lack of resolution for 3 years. Offered an appointment with PA but she declines. Encouraged her to consider it and to call back and ask for [facility].

## 2013-12-30 NOTE — Telephone Encounter (Signed)
Patient Information:  Caller Name: Jaquayla  Phone: [phone]  Patient: [name]  Gender: Female  DOB: May 23, 1950  Age: 63 Years  PCP: Alysia Penna [facility])  Office Follow Up:  Does the office need to follow up with this patient?: Yes  Instructions For The Office: Patient does not have transportation to office. She does not drive. Transportation requires 3 days notice.  She would like to schedule an appt for evaluation in 3 days. Care advice given. Unable to come to office today. Please contact.  RN Note:  Patient does not have transportation to office. She does not drive. Transportation requires 3 days notice.  She would like to schedule an appt for evaluation in 3 days. Care advice given. Unable to come to office today. Please contact.  Symptoms  Reason For Call & Symptoms: Patient has a history of CHF and COPD.  She reports that she had possible recent pneumonia on 12/15/13  and contacted the office and was called in [place] 500mg . She has completed the medication.  She reports swelling at feet/legs/hands that is ongoing but worse the last two weeks. . She is on Lasix 160mg  daily. Weight gain of 2lbs .  She states worsening short of breath last night 12/29/13. Cough productive yellow.  Slept elevated. Using advair with some relief.  Temperature 99.9 last night.  Afebrile today. Intermittent wheezing. None currently.  Reviewed Health History In EMR: Yes  Reviewed Medications In EMR: Yes  Reviewed Allergies In EMR: Yes  Reviewed Surgeries / Procedures: Yes  Date of Onset of Symptoms: 12/29/2013  Treatments Tried: Lasix. Advair.  Treatments Tried Worked: No  Guideline(s) Used:  Breathing Difficulty  Leg Swelling and Edema  Disposition Per Guideline:   Go to Office Now  Reason For Disposition Reached:   Mild difficulty breathing (e.g., minimal/no SOB at rest, SOB with walking, pulse < 100) of new onset or worse than normal  Advice Given:  General Care Advice  for Breathing Difficulty:  Find position of greatest comfort. For most patients the best position is semi-upright (e.g., sitting up in a comfortable chair or lying back against pillows).  Elevate head of bed (e.g., use pillows or place blocks under bed).  Avoid smoke or fume exposure.  Create a draft (e.g., use a fan directed at the face, or open a window).  Keep room temperature slightly on the cool side.  Limit activities or space activities apart during the day. Prioritize activities.  Use a humidifier.  Call Back If:  Severe difficulty breathing occurs  Fever more than 100.5 F (38.1 C)  You become worse.  Patient Refused Recommendation:  Patient Refused Appt, Patient Requests Appt At Later Date  Patient does not have transportation to office. She does not drive. Transportation requires 3 days notice.  She would like to schedule an appt for evaluation in 3 days. Care advice given. Unable to come to office today. Please contact.

## 2013-12-31 NOTE — Telephone Encounter (Signed)
See my note

## 2013-12-31 NOTE — Telephone Encounter (Signed)
Patient is aware 

## 2013-12-31 NOTE — Telephone Encounter (Signed)
I referred her to Dr. Earlean Shawl

## 2014-01-04 ENCOUNTER — Emergency Department (HOSPITAL_COMMUNITY): Payer: Medicare Other

## 2014-01-04 ENCOUNTER — Ambulatory Visit (INDEPENDENT_AMBULATORY_CARE_PROVIDER_SITE_OTHER): Payer: Medicare Other | Admitting: Family Medicine

## 2014-01-04 ENCOUNTER — Encounter (HOSPITAL_COMMUNITY): Payer: Self-pay | Admitting: *Deleted

## 2014-01-04 ENCOUNTER — Encounter: Payer: Self-pay | Admitting: Family Medicine

## 2014-01-04 ENCOUNTER — Inpatient Hospital Stay (HOSPITAL_COMMUNITY)
Admission: EM | Admit: 2014-01-04 | Discharge: 2014-01-10 | DRG: 392 | Disposition: A | Discharge status: Home / Self Care | Payer: Medicare Other | Attending: Family Medicine | Admitting: Family Medicine

## 2014-01-04 VITALS — BP 150/59 | HR 99 | Temp 99.9°F | Ht 60.0 in | Wt 203.0 lb

## 2014-01-04 DIAGNOSIS — R109 Unspecified abdominal pain: Principal | ICD-10-CM | POA: Diagnosis present

## 2014-01-04 DIAGNOSIS — K7460 Unspecified cirrhosis of liver: Secondary | ICD-10-CM | POA: Diagnosis present

## 2014-01-04 DIAGNOSIS — B9689 Other specified bacterial agents as the cause of diseases classified elsewhere: Secondary | ICD-10-CM | POA: Diagnosis present

## 2014-01-04 DIAGNOSIS — F329 Major depressive disorder, single episode, unspecified: Secondary | ICD-10-CM | POA: Diagnosis present

## 2014-01-04 DIAGNOSIS — J45909 Unspecified asthma, uncomplicated: Secondary | ICD-10-CM | POA: Diagnosis present

## 2014-01-04 DIAGNOSIS — R5081 Fever presenting with conditions classified elsewhere: Secondary | ICD-10-CM

## 2014-01-04 DIAGNOSIS — B955 Unspecified streptococcus as the cause of diseases classified elsewhere: Secondary | ICD-10-CM | POA: Insufficient documentation

## 2014-01-04 DIAGNOSIS — E1165 Type 2 diabetes mellitus with hyperglycemia: Secondary | ICD-10-CM | POA: Diagnosis present

## 2014-01-04 DIAGNOSIS — I200 Unstable angina: Secondary | ICD-10-CM

## 2014-01-04 DIAGNOSIS — K3189 Other diseases of stomach and duodenum: Secondary | ICD-10-CM | POA: Diagnosis present

## 2014-01-04 DIAGNOSIS — Z79899 Other long term (current) drug therapy: Secondary | ICD-10-CM

## 2014-01-04 DIAGNOSIS — E785 Hyperlipidemia, unspecified: Secondary | ICD-10-CM | POA: Diagnosis present

## 2014-01-04 DIAGNOSIS — Z79891 Long term (current) use of opiate analgesic: Secondary | ICD-10-CM

## 2014-01-04 DIAGNOSIS — G4733 Obstructive sleep apnea (adult) (pediatric): Secondary | ICD-10-CM | POA: Diagnosis present

## 2014-01-04 DIAGNOSIS — D649 Anemia, unspecified: Secondary | ICD-10-CM | POA: Diagnosis present

## 2014-01-04 DIAGNOSIS — Z950 Presence of cardiac pacemaker: Secondary | ICD-10-CM

## 2014-01-04 DIAGNOSIS — Z8249 Family history of ischemic heart disease and other diseases of the circulatory system: Secondary | ICD-10-CM

## 2014-01-04 DIAGNOSIS — D696 Thrombocytopenia, unspecified: Secondary | ICD-10-CM | POA: Diagnosis present

## 2014-01-04 DIAGNOSIS — D638 Anemia in other chronic diseases classified elsewhere: Secondary | ICD-10-CM | POA: Diagnosis present

## 2014-01-04 DIAGNOSIS — E1140 Type 2 diabetes mellitus with diabetic neuropathy, unspecified: Secondary | ICD-10-CM | POA: Diagnosis present

## 2014-01-04 DIAGNOSIS — Z880 Allergy status to penicillin: Secondary | ICD-10-CM

## 2014-01-04 DIAGNOSIS — F419 Anxiety disorder, unspecified: Secondary | ICD-10-CM | POA: Diagnosis present

## 2014-01-04 DIAGNOSIS — Z9981 Dependence on supplemental oxygen: Secondary | ICD-10-CM

## 2014-01-04 DIAGNOSIS — D519 Vitamin B12 deficiency anemia, unspecified: Secondary | ICD-10-CM | POA: Diagnosis not present

## 2014-01-04 DIAGNOSIS — I10 Essential (primary) hypertension: Secondary | ICD-10-CM | POA: Diagnosis present

## 2014-01-04 DIAGNOSIS — M1990 Unspecified osteoarthritis, unspecified site: Secondary | ICD-10-CM | POA: Diagnosis present

## 2014-01-04 DIAGNOSIS — Z885 Allergy status to narcotic agent status: Secondary | ICD-10-CM

## 2014-01-04 DIAGNOSIS — Z881 Allergy status to other antibiotic agents status: Secondary | ICD-10-CM

## 2014-01-04 DIAGNOSIS — J9610 Chronic respiratory failure, unspecified whether with hypoxia or hypercapnia: Secondary | ICD-10-CM | POA: Diagnosis present

## 2014-01-04 DIAGNOSIS — Z85118 Personal history of other malignant neoplasm of bronchus and lung: Secondary | ICD-10-CM

## 2014-01-04 DIAGNOSIS — J449 Chronic obstructive pulmonary disease, unspecified: Secondary | ICD-10-CM | POA: Diagnosis present

## 2014-01-04 DIAGNOSIS — Z9081 Acquired absence of spleen: Secondary | ICD-10-CM | POA: Diagnosis present

## 2014-01-04 DIAGNOSIS — I5032 Chronic diastolic (congestive) heart failure: Secondary | ICD-10-CM | POA: Diagnosis present

## 2014-01-04 DIAGNOSIS — Z8541 Personal history of malignant neoplasm of cervix uteri: Secondary | ICD-10-CM

## 2014-01-04 DIAGNOSIS — Z886 Allergy status to analgesic agent status: Secondary | ICD-10-CM

## 2014-01-04 DIAGNOSIS — Z833 Family history of diabetes mellitus: Secondary | ICD-10-CM

## 2014-01-04 DIAGNOSIS — Z90710 Acquired absence of both cervix and uterus: Secondary | ICD-10-CM

## 2014-01-04 DIAGNOSIS — J441 Chronic obstructive pulmonary disease with (acute) exacerbation: Secondary | ICD-10-CM

## 2014-01-04 DIAGNOSIS — K435 Parastomal hernia without obstruction or  gangrene: Secondary | ICD-10-CM | POA: Diagnosis present

## 2014-01-04 DIAGNOSIS — K219 Gastro-esophageal reflux disease without esophagitis: Secondary | ICD-10-CM | POA: Diagnosis present

## 2014-01-04 DIAGNOSIS — K7581 Nonalcoholic steatohepatitis (NASH): Secondary | ICD-10-CM | POA: Diagnosis present

## 2014-01-04 DIAGNOSIS — Z794 Long term (current) use of insulin: Secondary | ICD-10-CM

## 2014-01-04 DIAGNOSIS — R188 Other ascites: Secondary | ICD-10-CM

## 2014-01-04 DIAGNOSIS — R001 Bradycardia, unspecified: Secondary | ICD-10-CM | POA: Diagnosis present

## 2014-01-04 DIAGNOSIS — IMO0002 Reserved for concepts with insufficient information to code with codable children: Secondary | ICD-10-CM | POA: Diagnosis present

## 2014-01-04 DIAGNOSIS — Z87891 Personal history of nicotine dependence: Secondary | ICD-10-CM

## 2014-01-04 DIAGNOSIS — I5020 Unspecified systolic (congestive) heart failure: Secondary | ICD-10-CM

## 2014-01-04 DIAGNOSIS — R7881 Bacteremia: Secondary | ICD-10-CM | POA: Diagnosis present

## 2014-01-04 DIAGNOSIS — Z933 Colostomy status: Secondary | ICD-10-CM

## 2014-01-04 DIAGNOSIS — K589 Irritable bowel syndrome without diarrhea: Secondary | ICD-10-CM | POA: Diagnosis present

## 2014-01-04 DIAGNOSIS — N390 Urinary tract infection, site not specified: Secondary | ICD-10-CM | POA: Diagnosis present

## 2014-01-04 DIAGNOSIS — D500 Iron deficiency anemia secondary to blood loss (chronic): Secondary | ICD-10-CM

## 2014-01-04 DIAGNOSIS — K921 Melena: Secondary | ICD-10-CM | POA: Diagnosis present

## 2014-01-04 HISTORY — DX: Personal history of other medical treatment: Z92.89

## 2014-01-04 HISTORY — DX: Personal history of other diseases of the digestive system: Z87.19

## 2014-01-04 HISTORY — DX: Other pericardial effusion (noninflammatory): I31.39

## 2014-01-04 HISTORY — DX: Personal history of peptic ulcer disease: Z87.11

## 2014-01-04 HISTORY — DX: Unspecified chronic bronchitis: J42

## 2014-01-04 HISTORY — DX: Unspecified abdominal pain: R10.9

## 2014-01-04 HISTORY — DX: Other chronic pain: G89.29

## 2014-01-04 HISTORY — DX: Pericardial effusion (noninflammatory): I31.3

## 2014-01-04 HISTORY — DX: Migraine, unspecified, not intractable, without status migrainosus: G43.909

## 2014-01-04 HISTORY — DX: Dependence on supplemental oxygen: Z99.81

## 2014-01-04 HISTORY — DX: Pneumonia, unspecified organism: J18.9

## 2014-01-04 HISTORY — DX: Anemia in other chronic diseases classified elsewhere: D63.8

## 2014-01-04 HISTORY — DX: Type 2 diabetes mellitus without complications: E11.9

## 2014-01-04 LAB — PROTIME-INR
INR: 1.07 (ref 0.00–1.49)
Prothrombin Time: 14.1 seconds (ref 11.6–15.2)

## 2014-01-04 LAB — COMPREHENSIVE METABOLIC PANEL
ALBUMIN: 3.3 g/dL — AB (ref 3.5–5.2)
ALT: 35 U/L (ref 0–35)
AST: 45 U/L — AB (ref 0–37)
Alkaline Phosphatase: 124 U/L — ABNORMAL HIGH (ref 39–117)
Anion gap: 15 (ref 5–15)
BUN: 18 mg/dL (ref 6–23)
CO2: 26 mEq/L (ref 19–32)
CREATININE: 1.17 mg/dL — AB (ref 0.50–1.10)
Calcium: 11.5 mg/dL — ABNORMAL HIGH (ref 8.4–10.5)
Chloride: 96 mEq/L (ref 96–112)
GFR calc Af Amer: 57 mL/min — ABNORMAL LOW (ref >90)
GFR calc non Af Amer: 49 mL/min — ABNORMAL LOW (ref >90)
Glucose, Bld: 285 mg/dL — ABNORMAL HIGH (ref 70–99)
POTASSIUM: 3.9 meq/L (ref 3.7–5.3)
Sodium: 137 mEq/L (ref 137–147)
TOTAL PROTEIN: 6.9 g/dL (ref 6.0–8.3)
Total Bilirubin: 0.4 mg/dL (ref 0.3–1.2)

## 2014-01-04 LAB — I-STAT CG4 LACTIC ACID, ED
LACTIC ACID, VENOUS: 3.12 mmol/L — AB (ref 0.5–2.2)
Lactic Acid, Venous: 1.91 mmol/L (ref 0.5–2.2)

## 2014-01-04 LAB — CBC WITH DIFFERENTIAL/PLATELET
BASOS ABS: 0.1 10*3/uL (ref 0.0–0.1)
Basophils Relative: 1 % (ref 0–1)
Eosinophils Absolute: 1 10*3/uL — ABNORMAL HIGH (ref 0.0–0.7)
Eosinophils Relative: 7 % — ABNORMAL HIGH (ref 0–5)
HCT: 30.8 % — ABNORMAL LOW (ref 36.0–46.0)
Hemoglobin: 9.7 g/dL — ABNORMAL LOW (ref 12.0–15.0)
LYMPHS ABS: 3 10*3/uL (ref 0.7–4.0)
Lymphocytes Relative: 21 % (ref 12–46)
MCH: 28.4 pg (ref 26.0–34.0)
MCHC: 31.5 g/dL (ref 30.0–36.0)
MCV: 90.3 fL (ref 78.0–100.0)
MONOS PCT: 16 % — AB (ref 3–12)
Monocytes Absolute: 2.3 10*3/uL — ABNORMAL HIGH (ref 0.1–1.0)
Neutro Abs: 7.9 10*3/uL — ABNORMAL HIGH (ref 1.7–7.7)
Neutrophils Relative %: 55 % (ref 43–77)
PLATELETS: 387 10*3/uL (ref 150–400)
RBC: 3.41 MIL/uL — AB (ref 3.87–5.11)
RDW: 26.5 % — AB (ref 11.5–15.5)
WBC: 14.3 10*3/uL — AB (ref 4.0–10.5)

## 2014-01-04 LAB — URINALYSIS, ROUTINE W REFLEX MICROSCOPIC
Bilirubin Urine: NEGATIVE
Glucose, UA: 250 mg/dL — AB
Hgb urine dipstick: NEGATIVE
Ketones, ur: NEGATIVE mg/dL
NITRITE: NEGATIVE
Protein, ur: NEGATIVE mg/dL
SPECIFIC GRAVITY, URINE: 1.008 (ref 1.005–1.030)
Urobilinogen, UA: 0.2 mg/dL (ref 0.0–1.0)
pH: 6.5 (ref 5.0–8.0)

## 2014-01-04 LAB — URINE MICROSCOPIC-ADD ON

## 2014-01-04 LAB — SEDIMENTATION RATE: Sed Rate: 40 mm/hr — ABNORMAL HIGH (ref 0–22)

## 2014-01-04 LAB — TSH: TSH: 0.412 u[IU]/mL (ref 0.350–4.500)

## 2014-01-04 LAB — APTT: aPTT: 29 seconds (ref 24–37)

## 2014-01-04 LAB — PHOSPHORUS: Phosphorus: 2.7 mg/dL (ref 2.3–4.6)

## 2014-01-04 LAB — I-STAT TROPONIN, ED: Troponin i, poc: 0.01 ng/mL (ref 0.00–0.08)

## 2014-01-04 LAB — GLUCOSE, CAPILLARY: Glucose-Capillary: 176 mg/dL — ABNORMAL HIGH (ref 70–99)

## 2014-01-04 LAB — MAGNESIUM: MAGNESIUM: 1.9 mg/dL (ref 1.5–2.5)

## 2014-01-04 MED ORDER — IPRATROPIUM-ALBUTEROL 0.5-2.5 (3) MG/3ML IN SOLN: 3.0000 mL | RESPIRATORY_TRACT | Status: DC | PRN

## 2014-01-04 MED ORDER — SODIUM CHLORIDE 0.9 % IV SOLN
INTRAVENOUS | Status: AC
  Administered 2014-01-04: 22:00:00 via INTRAVENOUS

## 2014-01-04 MED ORDER — METRONIDAZOLE IN NACL 5-0.79 MG/ML-% IV SOLN
500.0000 mg | Freq: Once | INTRAVENOUS | Status: AC
  Administered 2014-01-04: 500 mg via INTRAVENOUS
  Filled 2014-01-04 (×2): qty 100

## 2014-01-04 MED ORDER — MOMETASONE FURO-FORMOTEROL FUM 100-5 MCG/ACT IN AERO
2.0000 | INHALATION_SPRAY | Freq: Two times a day (BID) | RESPIRATORY_TRACT | Status: DC
  Administered 2014-01-04 – 2014-01-10 (×12): 2 via RESPIRATORY_TRACT
  Filled 2014-01-04: qty 8.8

## 2014-01-04 MED ORDER — OXYCODONE HCL 5 MG PO TABS
20.0000 mg | ORAL_TABLET | ORAL | Status: DC | PRN
  Administered 2014-01-07 – 2014-01-10 (×20): 20 mg via ORAL
  Filled 2014-01-04 (×21): qty 4

## 2014-01-04 MED ORDER — DICYCLOMINE HCL 10 MG PO CAPS
10.0000 mg | ORAL_CAPSULE | Freq: Three times a day (TID) | ORAL | Status: DC
  Administered 2014-01-04 – 2014-01-10 (×17): 10 mg via ORAL
  Filled 2014-01-04 (×21): qty 1

## 2014-01-04 MED ORDER — LOSARTAN POTASSIUM 50 MG PO TABS
50.0000 mg | ORAL_TABLET | Freq: Every morning | ORAL | Status: DC
  Administered 2014-01-05 – 2014-01-10 (×6): 50 mg via ORAL
  Filled 2014-01-04 (×6): qty 1

## 2014-01-04 MED ORDER — HYDROMORPHONE HCL 1 MG/ML IJ SOLN
1.0000 mg | INTRAMUSCULAR | Status: DC | PRN
  Administered 2014-01-04: 1 mg via INTRAVENOUS
  Filled 2014-01-04: qty 1

## 2014-01-04 MED ORDER — METHOCARBAMOL 500 MG PO TABS
500.0000 mg | ORAL_TABLET | Freq: Four times a day (QID) | ORAL | Status: DC | PRN
  Administered 2014-01-10: 500 mg via ORAL
  Filled 2014-01-04: qty 1

## 2014-01-04 MED ORDER — HYDRALAZINE HCL 25 MG PO TABS
37.5000 mg | ORAL_TABLET | Freq: Two times a day (BID) | ORAL | Status: DC
  Administered 2014-01-04 – 2014-01-10 (×12): 37.5 mg via ORAL
  Filled 2014-01-04 (×13): qty 1.5

## 2014-01-04 MED ORDER — METOCLOPRAMIDE HCL 10 MG PO TABS
10.0000 mg | ORAL_TABLET | Freq: Three times a day (TID) | ORAL | Status: DC
  Administered 2014-01-04 – 2014-01-10 (×17): 10 mg via ORAL
  Filled 2014-01-04 (×21): qty 1

## 2014-01-04 MED ORDER — IOHEXOL 300 MG/ML  SOLN
25.0000 mL | Freq: Once | INTRAMUSCULAR | Status: AC | PRN
  Administered 2014-01-04: 25 mL via ORAL

## 2014-01-04 MED ORDER — DIAZEPAM 5 MG PO TABS
5.0000 mg | ORAL_TABLET | Freq: Three times a day (TID) | ORAL | Status: DC | PRN
  Administered 2014-01-05 – 2014-01-10 (×7): 5 mg via ORAL
  Filled 2014-01-04 (×7): qty 1

## 2014-01-04 MED ORDER — RIFAXIMIN 550 MG PO TABS
550.0000 mg | ORAL_TABLET | Freq: Two times a day (BID) | ORAL | Status: DC
  Administered 2014-01-04 – 2014-01-05 (×2): 550 mg via ORAL
  Filled 2014-01-04 (×3): qty 1

## 2014-01-04 MED ORDER — METRONIDAZOLE IN NACL 5-0.79 MG/ML-% IV SOLN
500.0000 mg | Freq: Three times a day (TID) | INTRAVENOUS | Status: DC
  Administered 2014-01-05 (×2): 500 mg via INTRAVENOUS
  Filled 2014-01-04 (×5): qty 100

## 2014-01-04 MED ORDER — LEVOFLOXACIN IN D5W 750 MG/150ML IV SOLN
750.0000 mg | INTRAVENOUS | Status: DC
  Filled 2014-01-04: qty 150

## 2014-01-04 MED ORDER — SODIUM CHLORIDE 0.9 % IV BOLUS (SEPSIS)
1000.0000 mL | Freq: Once | INTRAVENOUS | Status: AC
  Administered 2014-01-04: 1000 mL via INTRAVENOUS

## 2014-01-04 MED ORDER — PANTOPRAZOLE SODIUM 40 MG PO TBEC
40.0000 mg | DELAYED_RELEASE_TABLET | Freq: Every day | ORAL | Status: DC
  Administered 2014-01-04 – 2014-01-10 (×7): 40 mg via ORAL
  Filled 2014-01-04 (×7): qty 1

## 2014-01-04 MED ORDER — FOLIC ACID 1 MG PO TABS
1.0000 mg | ORAL_TABLET | Freq: Every evening | ORAL | Status: DC
  Administered 2014-01-04 – 2014-01-10 (×7): 1 mg via ORAL
  Filled 2014-01-04 (×7): qty 1

## 2014-01-04 MED ORDER — ENOXAPARIN SODIUM 40 MG/0.4ML ~~LOC~~ SOLN
40.0000 mg | SUBCUTANEOUS | Status: DC
  Filled 2014-01-04 (×7): qty 0.4

## 2014-01-04 MED ORDER — ONDANSETRON HCL 4 MG/2ML IJ SOLN
4.0000 mg | Freq: Three times a day (TID) | INTRAMUSCULAR | Status: AC | PRN
  Administered 2014-01-05: 4 mg via INTRAVENOUS
  Filled 2014-01-04: qty 2

## 2014-01-04 MED ORDER — LEVOFLOXACIN IN D5W 750 MG/150ML IV SOLN
750.0000 mg | Freq: Once | INTRAVENOUS | Status: AC
  Administered 2014-01-04: 750 mg via INTRAVENOUS
  Filled 2014-01-04: qty 150

## 2014-01-04 MED ORDER — FUROSEMIDE 80 MG PO TABS
80.0000 mg | ORAL_TABLET | Freq: Two times a day (BID) | ORAL | Status: DC
  Administered 2014-01-05 – 2014-01-10 (×11): 80 mg via ORAL
  Filled 2014-01-04 (×13): qty 1

## 2014-01-04 MED ORDER — ATORVASTATIN CALCIUM 20 MG PO TABS
20.0000 mg | ORAL_TABLET | Freq: Every morning | ORAL | Status: DC
  Administered 2014-01-05 – 2014-01-10 (×6): 20 mg via ORAL
  Filled 2014-01-04 (×6): qty 1

## 2014-01-04 MED ORDER — ISOSORBIDE DINITRATE 20 MG PO TABS
20.0000 mg | ORAL_TABLET | Freq: Two times a day (BID) | ORAL | Status: DC
  Administered 2014-01-04 – 2014-01-10 (×12): 20 mg via ORAL
  Filled 2014-01-04 (×15): qty 1

## 2014-01-04 MED ORDER — POLYSACCHARIDE IRON COMPLEX 150 MG PO CAPS
150.0000 mg | ORAL_CAPSULE | Freq: Two times a day (BID) | ORAL | Status: DC
  Administered 2014-01-04 – 2014-01-10 (×12): 150 mg via ORAL
  Filled 2014-01-04 (×13): qty 1

## 2014-01-04 MED ORDER — HYDROMORPHONE HCL 1 MG/ML IJ SOLN
1.0000 mg | Freq: Once | INTRAMUSCULAR | Status: AC
  Administered 2014-01-04: 1 mg via INTRAVENOUS
  Filled 2014-01-04: qty 1

## 2014-01-04 MED ORDER — IOHEXOL 300 MG/ML  SOLN
100.0000 mL | Freq: Once | INTRAMUSCULAR | Status: AC | PRN
  Administered 2014-01-04: 100 mL via INTRAVENOUS

## 2014-01-04 MED ORDER — PROMETHAZINE HCL 25 MG PO TABS
25.0000 mg | ORAL_TABLET | ORAL | Status: DC | PRN
  Administered 2014-01-05: 25 mg via ORAL
  Filled 2014-01-04: qty 1

## 2014-01-04 NOTE — Progress Notes (Signed)
New Admission Note:   Arrival Method: Via stretcher from ED Mental Orientation: Alert and oriented x4 Telemetry: Box #10-NSR Assessment: Completed Skin: See Docflowsheet IV: R FA Pain: 6 Tubes: LUQ Colostomy Safety Measures: Safety Fall Prevention Plan has been given, discussed. Admission: Completed [address] Orientation: Patient has been orientated to the room, unit and staff.  Family: N/A  Orders have been reviewed and implemented. Will continue to monitor the patient. Call light has been placed within reach and bed alarm has been activated.   [employer], RN-BC Phone number: [phone]

## 2014-01-04 NOTE — ED Notes (Signed)
MD at bedside. 

## 2014-01-04 NOTE — ED Notes (Signed)
Per Hess, PA pt did not meet Sepsis criteria until labs resulted, at that time abx was ordered, pt stable & remains on cardiac monitor

## 2014-01-04 NOTE — ED Provider Notes (Signed)
Patient received in sign out from PA Hess at shift change.  Briefly, 63 y.o. 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  She has been having some bleeding from her colostomy for the past 3 years without known cause.  + nausea without vomiting.  Subjective fever noted.  Lab work thus far remarkable for lactic acid 3.12, leukocytosis of 14.3.   U/a with many bacteria, culture pending.  CXR with vascular congestion vs viral/atypical infection.  Plan:  CT abdomen pelvis w/contrast pending for further evaluation.  Results for orders placed or performed during the hospital encounter of 01/04/14  CBC WITH DIFFERENTIAL  Result Value Ref Range   WBC 14.3 (H) 4.0 - 10.5 K/uL   RBC 3.41 (L) 3.87 - 5.11 MIL/uL   Hemoglobin 9.7 (L) 12.0 - 15.0 g/dL   HCT 30.8 (L) 36.0 - 46.0 %   MCV 90.3 78.0 - 100.0 fL   MCH 28.4 26.0 - 34.0 pg   MCHC 31.5 30.0 - 36.0 g/dL   RDW 26.5 (H) 11.5 - 15.5 %   Platelets 387 150 - 400 K/uL   Neutrophils Relative % 55 43 - 77 %   Lymphocytes Relative 21 12 - 46 %   Monocytes Relative 16 (H) 3 - 12 %   Eosinophils Relative 7 (H) 0 - 5 %   Basophils Relative 1 0 - 1 %   Neutro Abs 7.9 (H) 1.7 - 7.7 K/uL   Lymphs Abs 3.0 0.7 - 4.0 K/uL   Monocytes Absolute 2.3 (H) 0.1 - 1.0 K/uL   Eosinophils Absolute 1.0 (H) 0.0 - 0.7 K/uL   Basophils Absolute 0.1 0.0 - 0.1 K/uL   RBC Morphology HOWELL/JOLLY BODIES    Smear Review LARGE PLATELETS PRESENT   Comprehensive metabolic panel  Result Value Ref Range   Sodium 137 137 - 147 mEq/L   Potassium 3.9 3.7 - 5.3 mEq/L   Chloride 96 96 - 112 mEq/L   CO2 26 19 - 32 mEq/L   Glucose, Bld 285 (H) 70 - 99 mg/dL   BUN 18 6 - 23 mg/dL   Creatinine, Ser 1.17 (H) 0.50 - 1.10 mg/dL   Calcium 11.5 (H) 8.4 - 10.5 mg/dL   Total Protein 6.9 6.0 - 8.3 g/dL   Albumin 3.3 (L) 3.5 - 5.2 g/dL   AST 45 (H) 0 - 37 U/L   ALT 35 0 - 35 U/L   Alkaline Phosphatase 124  (H) 39 - 117 U/L   Total Bilirubin 0.4 0.3 - 1.2 mg/dL   GFR calc non Af Amer 49 (L) >90 mL/min   GFR calc Af Amer 57 (L) >90 mL/min   Anion gap 15 5 - 15  Urinalysis, Routine w reflex microscopic  Result Value Ref Range   Color, Urine YELLOW YELLOW   APPearance HAZY (A) CLEAR   Specific Gravity, Urine 1.008 1.005 - 1.030   pH 6.5 5.0 - 8.0   Glucose, UA 250 (A) NEGATIVE mg/dL   Hgb urine dipstick NEGATIVE NEGATIVE   Bilirubin Urine NEGATIVE NEGATIVE   Ketones, ur NEGATIVE NEGATIVE mg/dL   Protein, ur NEGATIVE NEGATIVE mg/dL   Urobilinogen, UA 0.2 0.0 - 1.0 mg/dL   Nitrite NEGATIVE NEGATIVE   Leukocytes, UA TRACE (A) NEGATIVE  Urine microscopic-add on  Result Value Ref Range   Squamous Epithelial / LPF RARE RARE   WBC, UA 3-6 <3 WBC/hpf   RBC / HPF 0-2 <3 RBC/hpf  Bacteria, UA MANY (A) RARE  I-Stat CG4 Lactic Acid, ED  Result Value Ref Range   Lactic Acid, Venous 3.12 (H) 0.5 - 2.2 mmol/L  I-stat troponin, ED  Result Value Ref Range   Troponin i, poc 0.01 0.00 - 0.08 ng/mL   Comment 3          I-Stat CG4 Lactic Acid, ED  Result Value Ref Range   Lactic Acid, Venous 1.91 0.5 - 2.2 mmol/L   Ct Abdomen Pelvis W Contrast  01/04/2014   CLINICAL DATA:  Abdominal pain.  Colostomy.  Fever.  Cirrhosis.  EXAM: CT ABDOMEN AND PELVIS WITH CONTRAST  TECHNIQUE: Multidetector CT imaging of the abdomen and pelvis was performed using the standard protocol following bolus administration of intravenous contrast.  CONTRAST:  151mL OMNIPAQUE IOHEXOL 300 MG/ML  SOLN  COMPARISON:  CT abdomen pelvis 11/29/2013  FINDINGS: Lung bases are clear aside from mild scarring. Cardiac enlargement. Pacemaker noted.  Cirrhotic liver with nodular contour of the capsule. The left lobe liver is enlarged and the liver is enlarged overall. Gallbladder is surgically absent. Spleen has been removed. Small regenerating splenules are stable. No varices are noted.  The kidneys show no obstruction or mass or stone. Pancreas  is negative for mass or edema. Common bile duct nondilated  Left colostomy. Large peristomal hernia containing bowel loops. No bowel obstruction.  No free fluid. No mass or adenopathy. No significant spinal abnormality.  IMPRESSION: Cirrhotic liver without ascites.  Large peristomal hernia on the left without bowel obstruction.   Electronically Signed   By: Franchot Gallo M.D.   On: 01/04/2014 16:39   Dg Chest Port 1 View  01/04/2014   CLINICAL DATA:  63 year old female with upper abdominal pain, nausea, weakness, fever. Initial encounter. History of partial left pneumonectomy due to lung cancer.  EXAM: PORTABLE CHEST - 1 VIEW  COMPARISON:  Chest radiographs 11/27/2013 and earlier.  FINDINGS: Portable AP semi upright view at 1434 hrs. Large body habitus. Lung with elevation of the left hemidiaphragm. Stable left chest cardiac pacemaker. Stable volume loss in the left Stable cardiac size and mediastinal contours. No pneumothorax. No pleural effusion or consolidation. Chronic but increased increased interstitial markings/vascular congestion. No overt edema.  IMPRESSION: Acute on chronic increased interstitial markings could relate to pulmonary vascular congestion or viral/atypical respiratory infection.   Electronically Signed   By: Lars Pinks M.D.   On: 01/04/2014 15:00    CT revealing cirrhotic liver without ascites as well as large peristomal hernia without obstruction.  On review of chart and on prior CT's, patient has hx of peristomal hernia intermittently in the past since placement of her colostomy.  On repeat exam, her abdomen is diffusely tender but not peritoneal.  Patient will need admission.  Case discussed with hospitalist, Dr. Maudie Mercury, who will admit to med-surg.  Temp admission orders placed.  GI has been consulted, Dr. Olevia Perches, will see patient in the morning.  Larene Pickett, PA-C 01/04/14 2030  Artis Delay, MD 01/08/14 [phone]

## 2014-01-04 NOTE — Progress Notes (Signed)
ANTIBIOTIC CONSULT NOTE - INITIAL  Pharmacy Consult for levaquin, flagyl  Indication: intra-abdominal infection  Allergies  Allergen Reactions  . Acetaminophen Other (See Comments)    Cirrhosis of liver  . Morphine Other (See Comments)    REACTION: Lowers BP  . Other Other (See Comments)    AGENT:  Per pt, cannot take blood thinners due to cirrhosis of the liver  . Penicillins Anaphylaxis and Rash  . Codeine Phosphate Other (See Comments)    REACTION: Stomach cramps  . Hydrocodone-Acetaminophen Other (See Comments)    REACTION: hallucinations  . Morphine And Related     Blood pressure drops   . Trazodone And Nefazodone     Cardiac arrythmia - DO NOT USE  . Cephalexin Swelling and Rash  . Hydrocodone-Acetaminophen Other (See Comments)    unknown    Patient Measurements: Height: 5' (152.4 cm) Weight: 202 lb (91.627 kg) IBW/kg (Calculated) : 45.5 Adjusted Body Weight:   Vital Signs: Temp: 98.1 F (36.7 C) (12/08 1427) Temp Source: Oral (12/08 1427) BP: 148/55 mmHg (12/08 1500) Pulse Rate: 92 (12/08 1500) Intake/Output from previous day:   Intake/Output from this shift:    Labs:  Recent Labs  01/04/14 1405  WBC 14.3*  HGB 9.7*  PLT 387  CREATININE 1.17*    Microbiology: Recent Results (from the past 720 hour(s))  TECHNOLOGIST REVIEW     Status: None   Collection Time: 12/09/13 10:36 AM  Result Value Ref Range Status   Technologist Review Few fragments and acanthocytes  Final    Medical History: Past Medical History  Diagnosis Date  . Cirrhosis   . GERD (gastroesophageal reflux disease)   . Cervical disc syndrome     trouble turnng neck at times  . Chronic respiratory failure   . Diverticulitis of colon   . Perforation of colon   . Arthritis   . IBS (irritable bowel syndrome)   . Chronic lower GI bleeding   . Overactive bladder   . Thrombocytopenia     sees Dr. Julien Nordmann   . Splenomegaly   . Depression   . Hypertension   . Asthma   . Chronic  headache disorder   . Blood transfusion without reported diagnosis   . Heart murmur   . Hyperlipidemia   . COPD (chronic obstructive pulmonary disease)     sees Dr. Gwenette Greet   . Anemia     sees Dr. Julien Nordmann, due to chronic disease and GI losses   . Diabetes mellitus     sees Dr. Cruzita Lederer   . Colostomy care   . Hypercalcemia   . 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 Cervical cancer many years ago  . CHF (congestive heart failure)   . History of home oxygen therapy april 2015    uses 2 liter oxygen all the time   . Sleep apnea 2012    mild, no cpap needed  . Complication of anesthesia     woke up during colonscopy and endoscopy in past  . Family history of anesthesia complication     daughter nausea/vomiting  . Pacemaker 12/14/2013    Medications:   (Not in a hospital admission)   Assessment: Admitted with weakness, fever, SOB. Has a history of iron deficiency anemia, NSCLC, colostomy due to diverticulitis with bowel perforation several years ago. Pt found to have bleeding from colostomy bag, initiating antibiotics for potential intra-abdominal infection. Scr 1.1, eCrCl 50-55 ml/min, WBC 14.3, LA 3.12.  Goal of Therapy:  Resolution of infection  Plan:  -Flagyl 500 mg IV q8h -Levaquin 750 mg IV q24h -Monitor renal function, cultures, clinical progress   Hughes Better, PharmD, BCPS Clinical Pharmacist Pager: [phone] 01/04/2014 3:32 PM

## 2014-01-04 NOTE — ED Notes (Signed)
Called to let CT know contrast finished.

## 2014-01-04 NOTE — ED Notes (Signed)
hospitalist at the bedside 

## 2014-01-04 NOTE — ED Notes (Signed)
Dinner tray ordered for pt

## 2014-01-04 NOTE — ED Notes (Signed)
Pt in from [place] via [facility], per EMS pt seen PCP today for weakness, fever & increased SOB, EMS reports pt c/o mod amt of bleeding from colostomy bag onset x 3 yrs worsening x3 days, pt hx of Cirrhosis, pt has colostomy bag to LLQ, pt reports intermittent fever x 1 wk, A&O x4, follows commands, -slurred speech

## 2014-01-04 NOTE — ED Notes (Signed)
Notified RN of CBG [id number]

## 2014-01-04 NOTE — Progress Notes (Signed)
Pre visit review using our clinic review tool, if applicable. No additional management support is needed unless otherwise documented below in the visit note. 

## 2014-01-04 NOTE — ED Provider Notes (Signed)
Medical screening examination/treatment/procedure(s) were conducted as a shared visit with non-physician practitioner(s) and myself.  I personally evaluated the patient during the encounter.   EKG Interpretation   Date/Time:  Tuesday January 04 2014 13:18:43 EST Ventricular Rate:  99 PR Interval:  173 QRS Duration: 97 QT Interval:  374 QTC Calculation: 480 R Axis:   -4 Text Interpretation:  Sinus rhythm No significant change was found  Confirmed by [facility]  MD, TREY ([id number]) on 01/04/2014 6:06:96 PM      63 year old female with complaint of fevers, shortness of breath, cough, and abdominal pain.  On exam, ill appearing, but nontoxic, not distressed, normal respiratory effort, normal perfusion, abdomen soft with generalized tenderness without any appreciable masses or hernias palpable, ostomy stoma with bright red blood in bag. Given antibiotics. She will need admission for further treatment.  Clinical Impression: 1. UTI (lower urinary tract infection)   2. Abdominal pain   3. Colostomy in place       Artis Delay, MD 01/04/14 1924

## 2014-01-04 NOTE — ED Provider Notes (Signed)
CSN: [id number]     Arrival date & time 01/04/14  1305 History   First MD Initiated Contact with Patient 01/04/14 1313     Chief Complaint  Patient presents with  . Weakness  . Fever     (Consider location/radiation/quality/duration/timing/severity/associated sxs/prior Treatment) HPI Comments: 63 year old female with an extensive past medical history presenting via EMS from her PCPs office with fever, weakness, increased shortness of breath and worsening bleeding from colostomy bag 1 week. Patient has had a colostomy bag since 2008 with bleeding over the past 3 years. Physicians have been unable to figure out the cause of the bleeding. It is noted from patient's office visit today that her PCP is concerned of a GI bleed. Patient reports low-grade fevers over the past few days between 99 and 100, today becoming 101 at her PCPs office. No medications given for fever as patient cannot take Tylenol or Motrin. Patient is complaining of 8 out of 10 diffuse abdominal pain, worse with any movement or palpation. Admits to nausea without vomiting. Denies any urinary complaints. Denies cough. She reports intermittent generalized chest pain over the past few weeks.  Patient is a 63 y.o. female presenting with weakness and fever. The history is provided by the patient.  Weakness Associated symptoms include a fever and weakness.  Fever   Past Medical History  Diagnosis Date  . Cirrhosis   . GERD (gastroesophageal reflux disease)   . Cervical disc syndrome     trouble turnng neck at times  . Chronic respiratory failure   . Diverticulitis of colon   . Perforation of colon   . Arthritis   . IBS (irritable bowel syndrome)   . Chronic lower GI bleeding   . Overactive bladder   . Thrombocytopenia     sees Dr. Julien Nordmann   . Splenomegaly   . Depression   . Hypertension   . Asthma   . Chronic headache disorder   . Blood transfusion without reported diagnosis   . Heart murmur   . Hyperlipidemia   .  COPD (chronic obstructive pulmonary disease)     sees Dr. Gwenette Greet   . Anemia     sees Dr. Julien Nordmann, due to chronic disease and GI losses   . Diabetes mellitus     sees Dr. Cruzita Lederer   . Colostomy care   . Hypercalcemia   . 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 Cervical cancer many years ago  . CHF (congestive heart failure)   . History of home oxygen therapy april 2015    uses 2 liter oxygen all the time   . Sleep apnea 2012    mild, no cpap needed  . Complication of anesthesia     woke up during colonscopy and endoscopy in past  . Family history of anesthesia complication     daughter nausea/vomiting  . Pacemaker 12/14/2013   Past Surgical History  Procedure Laterality Date  . Appendectomy    . Cholecystectomy    . Colostomy  11/06/2005  . Pneumonectomy  2004    upper left lobe removed  . Cervix removed    . Left colectomy  11/06/2005    Hartmann resection of sigmoid colon and end colostomy.  . Esophagogastroduodenoscopy  03/19/2011    Procedure: ESOPHAGOGASTRODUODENOSCOPY (EGD);  Surgeon: [name], MD;  Location: [facility];  Service: Endoscopy;  Laterality: N/A;  . Colonoscopy  03/19/2011    Procedure: COLONOSCOPY;  Surgeon: [name], MD;  Location: [facility];  Service: Endoscopy;  Laterality: N/A;  . Givens capsule study  03/20/2011    Procedure: GIVENS CAPSULE STUDY;  Surgeon: [name], MD;  Location: [facility];  Service: Endoscopy;  Laterality: N/A;  . Small bowel obstruction repair  March 2012  . Umbilical hernia repair  March 2012  . Splenectomy, total N/A 02/24/2013    Procedure: SPLENECTOMY;  Surgeon: Harl Bowie, MD;  Location: [place];  Service: General;  Laterality: N/A;  . Orif patella fracture Left 04/21/2013    DR [name]  . Orif patella Left 04/21/2013    Procedure: OPEN REDUCTION INTERNAL (ORIF) FIXATION PATELLA;  Surgeon: Augustin Schooling, MD;  Location: [place];  Service: Orthopedics;  Laterality: Left;  .  Pacemaker insertion    . Esophagogastroduodenoscopy (egd) with propofol N/A 11/02/2013    Procedure: ESOPHAGOGASTRODUODENOSCOPY (EGD) WITH PROPOFOL;  Surgeon: [name], MD;  Location: [facility];  Service: Endoscopy;  Laterality: N/A;  EGD with APC  . Hot hemostasis N/A 11/02/2013    Procedure: HOT HEMOSTASIS (ARGON PLASMA COAGULATION/BICAP);  Surgeon: [name], MD;  Location: [facility];  Service: Endoscopy;  Laterality: N/A;   Family History  Problem Relation Age of Onset  . Coronary artery disease    . Diabetes type II    . Anesthesia problems Neg Hx   . Hypotension Neg Hx   . Malignant hyperthermia Neg Hx   . Pseudochol deficiency Neg Hx   . Heart attack Mother   . Diabetes type II Mother   . Cirrhosis Father    History  Substance Use Topics  . Smoking status: Former Smoker -- 2.00 packs/day for 35 years    Types: Cigarettes    Quit date: 03/24/2002  . Smokeless tobacco: Never Used  . Alcohol Use: No   OB History    No data available     Review of Systems  Constitutional: Positive for fever.  Neurological: Positive for weakness.   10 Systems reviewed and are negative for acute change except as noted in the HPI.   Allergies  Acetaminophen; Morphine; Other; Penicillins; Codeine phosphate; Hydrocodone-acetaminophen; Morphine and related; Trazodone and nefazodone; Cephalexin; and Hydrocodone-acetaminophen  Home Medications   Prior to Admission medications   Medication Sig Start Date End Date Taking? Authorizing Provider  albuterol-ipratropium (COMBIVENT) 18-103 MCG/ACT inhaler Inhale 2 puffs into the lungs every 4 (four) hours as needed for wheezing or shortness of breath.     Historical Provider, MD  atorvastatin (LIPITOR) 20 MG tablet Take 20 mg by mouth every morning.    Historical Provider, MD  diazepam (VALIUM) 5 MG tablet Take 1 tablet (5 mg total) by mouth 3 (three) times daily as needed for anxiety. 07/29/13   [name], MD  dicyclomine  (BENTYL) 10 MG capsule Take 10 mg by mouth 3 (three) times daily.     Historical Provider, MD  esomeprazole (NEXIUM) 40 MG capsule Take 40 mg by mouth every evening.    Historical Provider, MD  Fluticasone-Salmeterol (ADVAIR) 250-50 MCG/DOSE AEPB Inhale 1 puff into the lungs every 12 (twelve) hours. 04/12/13   [name], MD  folic acid (FOLVITE) 1 MG tablet Take 1 tablet (1 mg total) by mouth every evening. 04/12/13   [name], MD  furosemide (LASIX) 40 MG tablet Take 2 tablets (80 mg total) by mouth 2 (two) times daily. 10/05/13   [name], MD  hydrALAZINE (APRESOLINE) 25 MG tablet Take 1.5 tablets (37.5 mg total)  by mouth 2 (two) times daily. 10/13/13   [name], MD  insulin regular human CONCENTRATED (HUMULIN R) 500 UNIT/ML SOLN injection Inject under skin 0.30 mL before b'fast, 0.25 mL before lunch and 0.22 mL before dinner 12/28/13   Philemon Kingdom, MD  INSULIN SYRINGE 1CC/29G (B-D INSULIN SYRINGE) 29G X 1/2" 1 ML MISC Use to inject insulin 3 times daily. 11/23/13   Philemon Kingdom, MD  iron polysaccharides (FERREX 150) 150 MG capsule Take 1 capsule (150 mg total) by mouth 2 (two) times daily. 10/07/13   [name], MD  isosorbide dinitrate (ISORDIL) 20 MG tablet Take 1 tablet (20 mg total) by mouth 2 (two) times daily. 10/13/13   [name], MD  levofloxacin (LEVAQUIN) 500 MG tablet Take 1 tablet (500 mg total) by mouth daily. Patient not taking: Reported on 01/04/2014 12/15/13   [name], MD  losartan (COZAAR) 50 MG tablet Take 50 mg by mouth every morning.    Historical Provider, MD  metFORMIN (GLUCOPHAGE) 500 MG tablet Take 1 tablet (500 mg total) by mouth 2 (two) times daily with a meal. 12/28/13   Philemon Kingdom, MD  methocarbamol (ROBAXIN) 500 MG tablet Take 1 tablet (500 mg total) by mouth every 6 (six) hours as needed for muscle spasms. 05/03/13   Bary Leriche, PA-C  metoCLOPramide (REGLAN) 10 MG tablet Take 1 tablet (10 mg total) by mouth 3 (three) times daily.  04/12/13   [name], MD  Oxycodone HCl 20 MG TABS Take 1 tablet (20 mg total) by mouth every 3 (three) hours as needed (pain). 10/13/13   [name], MD  promethazine (PHENERGAN) 25 MG tablet Take 1 tablet (25 mg total) by mouth every 4 (four) hours as needed for nausea or vomiting. 11/23/13   [name], MD  rifaximin (XIFAXAN) 550 MG TABS tablet Take 550 mg by mouth 2 (two) times daily. 06/29/12   [name], MD  Syringe/Needle, Disp, 25G X 1" 1 ML MISC Use 3x a day 11/22/13   Philemon Kingdom, MD   Temp(Src) 98.3 F (36.8 C) (Oral)  Ht 5' (1.524 m)  Wt 202 lb (91.627 kg)  BMI 39.45 kg/m2  SpO2 98% Physical Exam  Constitutional: She is oriented to person, place, and time. She appears well-developed and well-nourished.  Non-toxic appearance. She appears ill. No distress.  Appears fatigued.  HENT:  Head: Normocephalic and atraumatic.  Dry MM.  Eyes: Conjunctivae are normal.  Neck: Normal range of motion. Neck supple.  Cardiovascular: Normal rate, regular rhythm and normal heart sounds.   Pulmonary/Chest: Effort normal and breath sounds normal.  Abdominal: Soft. Bowel sounds are normal. There is no tenderness.    Diffuse abdominal tenderness with guarding. No rebound or rigidity.  Musculoskeletal: Normal range of motion. She exhibits no edema.  Neurological: She is alert and oriented to person, place, and time.  Skin: Skin is warm and dry. She is not diaphoretic. There is pallor.  Psychiatric: She has a normal mood and affect. Her behavior is normal.  Nursing note and vitals reviewed.   ED Course  Procedures (including critical care time) Labs Review Labs Reviewed  CULTURE, BLOOD (ROUTINE X 2)  CULTURE, BLOOD (ROUTINE X 2)  URINE CULTURE  CBC WITH DIFFERENTIAL  COMPREHENSIVE METABOLIC PANEL  URINALYSIS, ROUTINE W REFLEX MICROSCOPIC  CBG MONITORING, ED  I-STAT CG4 LACTIC ACID, ED  Randolm Idol, ED    Imaging Review Dg Chest Port 1 View  01/04/2014  CLINICAL DATA:  63 year old female with upper abdominal pain, nausea, weakness, fever. Initial encounter. History of partial left pneumonectomy due to lung cancer.  EXAM: PORTABLE CHEST - 1 VIEW  COMPARISON:  Chest radiographs 11/27/2013 and earlier.  FINDINGS: Portable AP semi upright view at 1434 hrs. Large body habitus. Lung with elevation of the left hemidiaphragm. Stable left chest cardiac pacemaker. Stable volume loss in the left Stable cardiac size and mediastinal contours. No pneumothorax. No pleural effusion or consolidation. Chronic but increased increased interstitial markings/vascular congestion. No overt edema.  IMPRESSION: Acute on chronic increased interstitial markings could relate to pulmonary vascular congestion or viral/atypical respiratory infection.   Electronically Signed   By: Lars Pinks M.D.   On: 01/04/2014 15:00     EKG Interpretation None      MDM   Final diagnoses:  Abdominal pain    Patient is nontoxic-appearing, she does appear fatigued and ill but in no apparent distress. Vital signs stable. Temperature 99.9, plan to obtain rectal temp. Abdomen is diffusely tender with guarding, blood noted in her colostomy bag which has been present for a while. Given patient's recent intermittent fevers, history of abdominal surgeries and diffuse abdominal pain, will obtain abdominal CT. Labs pending.  Leukocytosis of 14.3. Lactic acid 3.12. Broad-spectrum antibiotics for probable intra-abdominal infection started. Patient is penicillin allergic with a reported anaphylactic reaction, will start patient on Levaquin and Flagyl.  Chest x-ray showing acute on chronic increased and a social markings, possibly relating to pulmonary vascular congestion or viral/atypical respiratory infection. Patient is receiving Levaquin which will cover respiratory infection. Abdominal CT pending. Patient signed out to Quincy Carnes, PA-C at shift change. Plan to admit.  Discussed with attending Dr.  Johnney Killian who agrees with plan of care.   Carman Ching, PA-C 01/04/14 1614  Charlesetta Shanks, MD 01/04/14 [phone]

## 2014-01-04 NOTE — Progress Notes (Signed)
   Subjective:    Patient ID: [name], female    DOB: 1950-09-15, 63 y.o.   MRN: [id number]  HPI Here for one week of worsening sx including weakness, SOB, fever, headache, diarrhea, and lots of bleeding in the stools. She has known GI bleeding and her Hgb was down to 8.2 on 12-09-13. She is very weak today and can barely walk even using her walker.    Review of Systems  Constitutional: Positive for fever, chills, diaphoresis and fatigue.  HENT: Negative.   Eyes: Negative.   Respiratory: Positive for cough and shortness of breath. Negative for wheezing.   Cardiovascular: Negative.   Gastrointestinal: Positive for nausea, diarrhea and blood in stool. Negative for vomiting, abdominal pain and abdominal distention.  Genitourinary: Negative.        Objective:   Physical Exam  Constitutional: She is oriented to person, place, and time.  She appears quite ill, she is weak and pale   Cardiovascular: Normal rate, regular rhythm, normal heart sounds and intact distal pulses.   Pulmonary/Chest: Effort normal. No respiratory distress. She has no wheezes.  Scattered wheezes   Neurological: She is alert and oriented to person, place, and time.          Assessment & Plan:  She has known GI bleeding and she is probably severely anemic again. She has fever from some source which is not apparent today. We will call EMS to transport her to [facility] ER for further evaluation.

## 2014-01-04 NOTE — ED Notes (Signed)
PT has finished her contrast. Notified CT

## 2014-01-04 NOTE — ED Notes (Signed)
NOTIFIED ROBYN HESS ,PA-C IN PERSON FOR PATIENTS PANIC LAB RESULTS OF CG4+ LACTIC ACID =3.13mmol/L ,@14 :45 PM ,01/04/2014.

## 2014-01-04 NOTE — H&P (Addendum)
[name] is an 63 y.o. female.    Pcp:  [facility]  Chief Complaint: abdominal pain HPI: 63 yo female with cirrhosis, diverticulitis, IBS, chronic abdominal pain apparently c/o increase in abdominal pain, diffuse, sharp intermittent pain.  Slight fever yesterday and intermittently for the past 2 weeks 99.9 per pt.  1 episode of blood in stool in colostomy today.  Pt states that this has happened in the past.  Pt states that she has had extensive investigation which has been unfruitful. Pt had CT scan with parastomal hernia. Which has been there in the past.  Pt was noted to have hypercalcemia and anemia. Pt will be admitted for observation of her abdominal pain.   Past Medical History  Diagnosis Date  . Cirrhosis   . GERD (gastroesophageal reflux disease)   . Cervical disc syndrome     trouble turnng neck at times  . Chronic respiratory failure   . Diverticulitis of colon   . Perforation of colon   . Arthritis   . IBS (irritable bowel syndrome)   . Chronic lower GI bleeding   . Overactive bladder   . Thrombocytopenia     sees Dr. Julien Nordmann   . Splenomegaly   . Depression   . Hypertension   . Asthma   . Chronic headache disorder   . Blood transfusion without reported diagnosis   . Heart murmur   . Hyperlipidemia   . COPD (chronic obstructive pulmonary disease)     sees Dr. Gwenette Greet   . Anemia     sees Dr. Julien Nordmann, due to chronic disease and GI losses   . Diabetes mellitus     sees Dr. Cruzita Lederer   . Colostomy care   . Hypercalcemia   . 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 Cervical cancer many years ago  . CHF (congestive heart failure)   . History of home oxygen therapy april 2015    uses 2 liter oxygen all the time   . Sleep apnea 2012    mild, no cpap needed  . Complication of anesthesia     woke up during colonscopy and endoscopy in past  . Family history of anesthesia complication     daughter nausea/vomiting  .  Pacemaker 12/14/2013    Past Surgical History  Procedure Laterality Date  . Appendectomy    . Cholecystectomy    . Colostomy  11/06/2005  . Pneumonectomy  2004    upper left lobe removed  . Cervix removed    . Left colectomy  11/06/2005    Hartmann resection of sigmoid colon and end colostomy.  . Esophagogastroduodenoscopy  03/19/2011    Procedure: ESOPHAGOGASTRODUODENOSCOPY (EGD);  Surgeon: [name], MD;  Location: [facility];  Service: Endoscopy;  Laterality: N/A;  . Colonoscopy  03/19/2011    Procedure: COLONOSCOPY;  Surgeon: [name], MD;  Location: [facility];  Service: Endoscopy;  Laterality: N/A;  . Givens capsule study  03/20/2011    Procedure: GIVENS CAPSULE STUDY;  Surgeon: [name], MD;  Location: [facility];  Service: Endoscopy;  Laterality: N/A;  . Small bowel obstruction repair  March 2012  . Umbilical hernia repair  March 2012  . Splenectomy, total N/A 02/24/2013    Procedure: SPLENECTOMY;  Surgeon: Harl Bowie, MD;  Location: [facility];  Service: General;  Laterality: N/A;  . Orif patella fracture Left 04/21/2013    DR [name]  . Orif patella Left  04/21/2013    Procedure: OPEN REDUCTION INTERNAL (ORIF) FIXATION PATELLA;  Surgeon: Augustin Schooling, MD;  Location: [facility];  Service: Orthopedics;  Laterality: Left;  . Pacemaker insertion    . Esophagogastroduodenoscopy (egd) with propofol N/A 11/02/2013    Procedure: ESOPHAGOGASTRODUODENOSCOPY (EGD) WITH PROPOFOL;  Surgeon: [name], MD;  Location: [facility];  Service: Endoscopy;  Laterality: N/A;  EGD with APC  . Hot hemostasis N/A 11/02/2013    Procedure: HOT HEMOSTASIS (ARGON PLASMA COAGULATION/BICAP);  Surgeon: [name], MD;  Location: [facility];  Service: Endoscopy;  Laterality: N/A;    Family History  Problem Relation Age of Onset  . Coronary artery disease    . Diabetes type II    . Anesthesia problems Neg Hx   . Hypotension Neg Hx   . Malignant hyperthermia Neg Hx   .  Pseudochol deficiency Neg Hx   . Heart attack Mother   . Diabetes type II Mother   . Cirrhosis Father    Social History:  reports that she quit smoking about 11 years ago. Her smoking use included Cigarettes. She has a 70 pack-year smoking history. She has never used smokeless tobacco. She reports that she does not drink alcohol or use illicit drugs.  Allergies:  Allergies  Allergen Reactions  . Acetaminophen Other (See Comments)    Cirrhosis of liver  . Morphine Other (See Comments)    REACTION: Lowers BP  . Other Other (See Comments)    AGENT:  Per pt, cannot take blood thinners due to cirrhosis of the liver  . Penicillins Anaphylaxis and Rash  . Codeine Phosphate Other (See Comments)    REACTION: Stomach cramps  . Hydrocodone-Acetaminophen Other (See Comments)    REACTION: hallucinations  . Morphine And Related     Blood pressure drops   . Trazodone And Nefazodone     Cardiac arrythmia - DO NOT USE  . Cephalexin Swelling and Rash  . Hydrocodone-Acetaminophen Other (See Comments)    unknown     (Not in a hospital admission)  Results for orders placed or performed during the hospital encounter of 01/04/14 (from the past 48 hour(s))  CBC WITH DIFFERENTIAL     Status: Abnormal   Collection Time: 01/04/14  2:05 PM  Result Value Ref Range   WBC 14.3 (H) 4.0 - 10.5 K/uL   RBC 3.41 (L) 3.87 - 5.11 MIL/uL   Hemoglobin 9.7 (L) 12.0 - 15.0 g/dL   HCT 30.8 (L) 36.0 - 46.0 %   MCV 90.3 78.0 - 100.0 fL   MCH 28.4 26.0 - 34.0 pg   MCHC 31.5 30.0 - 36.0 g/dL   RDW 26.5 (H) 11.5 - 15.5 %   Platelets 387 150 - 400 K/uL   Neutrophils Relative % 55 43 - 77 %   Lymphocytes Relative 21 12 - 46 %   Monocytes Relative 16 (H) 3 - 12 %   Eosinophils Relative 7 (H) 0 - 5 %   Basophils Relative 1 0 - 1 %   Neutro Abs 7.9 (H) 1.7 - 7.7 K/uL   Lymphs Abs 3.0 0.7 - 4.0 K/uL   Monocytes Absolute 2.3 (H) 0.1 - 1.0 K/uL   Eosinophils Absolute 1.0 (H) 0.0 - 0.7 K/uL   Basophils Absolute 0.1  0.0 - 0.1 K/uL   RBC Morphology HOWELL/JOLLY BODIES     Comment: ACANTHOCYTES   Smear Review LARGE PLATELETS PRESENT   Comprehensive metabolic panel     Status: Abnormal   Collection  Time: 01/04/14  2:05 PM  Result Value Ref Range   Sodium 137 137 - 147 mEq/L   Potassium 3.9 3.7 - 5.3 mEq/L   Chloride 96 96 - 112 mEq/L   CO2 26 19 - 32 mEq/L   Glucose, Bld 285 (H) 70 - 99 mg/dL   BUN 18 6 - 23 mg/dL   Creatinine, Ser 1.17 (H) 0.50 - 1.10 mg/dL   Calcium 11.5 (H) 8.4 - 10.5 mg/dL   Total Protein 6.9 6.0 - 8.3 g/dL   Albumin 3.3 (L) 3.5 - 5.2 g/dL   AST 45 (H) 0 - 37 U/L   ALT 35 0 - 35 U/L   Alkaline Phosphatase 124 (H) 39 - 117 U/L   Total Bilirubin 0.4 0.3 - 1.2 mg/dL   GFR calc non Af Amer 49 (L) >90 mL/min   GFR calc Af Amer 57 (L) >90 mL/min    Comment: (NOTE) The eGFR has been calculated using the CKD EPI equation. This calculation has not been validated in all clinical situations. eGFR's persistently <90 mL/min signify possible Chronic Kidney Disease.    Anion gap 15 5 - 15  Urinalysis, Routine w reflex microscopic     Status: Abnormal   Collection Time: 01/04/14  2:15 PM  Result Value Ref Range   Color, Urine YELLOW YELLOW   APPearance HAZY (A) CLEAR   Specific Gravity, Urine 1.008 1.005 - 1.030   pH 6.5 5.0 - 8.0   Glucose, UA 250 (A) NEGATIVE mg/dL   Hgb urine dipstick NEGATIVE NEGATIVE   Bilirubin Urine NEGATIVE NEGATIVE   Ketones, ur NEGATIVE NEGATIVE mg/dL   Protein, ur NEGATIVE NEGATIVE mg/dL   Urobilinogen, UA 0.2 0.0 - 1.0 mg/dL   Nitrite NEGATIVE NEGATIVE   Leukocytes, UA TRACE (A) NEGATIVE  Urine microscopic-add on     Status: Abnormal   Collection Time: 01/04/14  2:15 PM  Result Value Ref Range   Squamous Epithelial / LPF RARE RARE   WBC, UA 3-6 <3 WBC/hpf   RBC / HPF 0-2 <3 RBC/hpf   Bacteria, UA MANY (A) RARE  I-stat troponin, ED     Status: None   Collection Time: 01/04/14  2:25 PM  Result Value Ref Range   Troponin i, poc 0.01 0.00 - 0.08  ng/mL   Comment 3            Comment: Due to the release kinetics of cTnI, a negative result within the first hours of the onset of symptoms does not rule out myocardial infarction with certainty. If myocardial infarction is still suspected, repeat the test at appropriate intervals.   I-Stat CG4 Lactic Acid, ED     Status: Abnormal   Collection Time: 01/04/14  2:28 PM  Result Value Ref Range   Lactic Acid, Venous 3.12 (H) 0.5 - 2.2 mmol/L  I-Stat CG4 Lactic Acid, ED     Status: None   Collection Time: 01/04/14  4:43 PM  Result Value Ref Range   Lactic Acid, Venous 1.91 0.5 - 2.2 mmol/L   Ct Abdomen Pelvis W Contrast  01/04/2014   CLINICAL DATA:  Abdominal pain.  Colostomy.  Fever.  Cirrhosis.  EXAM: CT ABDOMEN AND PELVIS WITH CONTRAST  TECHNIQUE: Multidetector CT imaging of the abdomen and pelvis was performed using the standard protocol following bolus administration of intravenous contrast.  CONTRAST:  129m OMNIPAQUE IOHEXOL 300 MG/ML  SOLN  COMPARISON:  CT abdomen pelvis 11/29/2013  FINDINGS: Lung bases are clear aside from mild scarring. Cardiac  enlargement. Pacemaker noted.  Cirrhotic liver with nodular contour of the capsule. The left lobe liver is enlarged and the liver is enlarged overall. Gallbladder is surgically absent. Spleen has been removed. Small regenerating splenules are stable. No varices are noted.  The kidneys show no obstruction or mass or stone. Pancreas is negative for mass or edema. Common bile duct nondilated  Left colostomy. Large peristomal hernia containing bowel loops. No bowel obstruction.  No free fluid. No mass or adenopathy. No significant spinal abnormality.  IMPRESSION: Cirrhotic liver without ascites.  Large peristomal hernia on the left without bowel obstruction.   Electronically Signed   By: Franchot Gallo M.D.   On: 01/04/2014 16:39   Dg Chest Port 1 View  01/04/2014   CLINICAL DATA:  63 year old female with upper abdominal pain, nausea, weakness, fever.  Initial encounter. History of partial left pneumonectomy due to lung cancer.  EXAM: PORTABLE CHEST - 1 VIEW  COMPARISON:  Chest radiographs 11/27/2013 and earlier.  FINDINGS: Portable AP semi upright view at 1434 hrs. Large body habitus. Lung with elevation of the left hemidiaphragm. Stable left chest cardiac pacemaker. Stable volume loss in the left Stable cardiac size and mediastinal contours. No pneumothorax. No pleural effusion or consolidation. Chronic but increased increased interstitial markings/vascular congestion. No overt edema.  IMPRESSION: Acute on chronic increased interstitial markings could relate to pulmonary vascular congestion or viral/atypical respiratory infection.   Electronically Signed   By: Lars Pinks M.D.   On: 01/04/2014 15:00    Review of Systems  Constitutional: Negative for fever, chills, weight loss, malaise/fatigue and diaphoresis.  HENT: Negative for congestion, ear discharge, ear pain, hearing loss, nosebleeds, sore throat and tinnitus.   Eyes: Negative for blurred vision, double vision, photophobia, pain, discharge and redness.  Respiratory: Negative for cough, hemoptysis, sputum production, shortness of breath, wheezing and stridor.   Cardiovascular: Negative for chest pain, palpitations, orthopnea, claudication, leg swelling and PND.  Gastrointestinal: Positive for nausea, abdominal pain, diarrhea and blood in stool. Negative for heartburn, vomiting, constipation and melena.       Chronic diarrhea  Genitourinary: Negative for dysuria, urgency, frequency, hematuria and flank pain.  Musculoskeletal: Negative for myalgias, back pain, joint pain, falls and neck pain.  Skin: Negative for itching and rash.  Neurological: Negative for dizziness, tingling, tremors, sensory change, speech change, focal weakness, seizures, loss of consciousness, weakness and headaches.  Endo/Heme/Allergies: Negative for environmental allergies and polydipsia. Does not bruise/bleed easily.   Psychiatric/Behavioral: Negative for depression, suicidal ideas, hallucinations, memory loss and substance abuse. The patient is not nervous/anxious and does not have insomnia.     Blood pressure 132/56, pulse 79, temperature 98.1 F (36.7 C), temperature source Oral, resp. rate 16, height 5' (1.524 m), weight 91.627 kg (202 lb), SpO2 97 %. Physical Exam  Constitutional: She is oriented to person, place, and time. She appears well-developed and well-nourished.  HENT:  Head: Normocephalic and atraumatic.  Eyes: Conjunctivae and EOM are normal. Pupils are equal, round, and reactive to light. Right eye exhibits no discharge. Left eye exhibits no discharge.  Neck: Normal range of motion. Neck supple. No JVD present. No tracheal deviation present. No thyromegaly present.  Cardiovascular: Normal rate and regular rhythm.  Exam reveals no gallop and no friction rub.   No murmur heard. Respiratory: Breath sounds normal. No stridor. No respiratory distress. She has no wheezes. She has no rales.  GI: Soft. Bowel sounds are normal. She exhibits no distension. There is no tenderness. There is no  rebound and no guarding.  + colostomy  Musculoskeletal: Normal range of motion. She exhibits no edema or tenderness.  Lymphadenopathy:    She has no cervical adenopathy.  Neurological: She is alert and oriented to person, place, and time. She has normal reflexes. She displays normal reflexes. No cranial nerve deficit. She exhibits normal muscle tone. Coordination normal.  Skin: Skin is warm and dry. No rash noted. No erythema. No pallor.  Psychiatric: She has a normal mood and affect. Her behavior is normal.     Assessment/Plan Abdominal pain secondary to ? Adhesions Dilaudid 31m iv q2h prn  Anemia Check cbc in am  Hypercalcemia Apparently not new,  Check mag, phos, esr, pth, pth rp, tsh, vitamin D Hydrate gently with normal saline, not too much due to hx of chf.   Dm2 fsbs ac and qhs, iss  Lactic  acidosis D/c metformin  CHF (EF 60-65%) Stable  Copd stable   Cailah Reach 01/04/2014, 7:46 PM

## 2014-01-05 ENCOUNTER — Encounter (HOSPITAL_COMMUNITY): Payer: Self-pay | Admitting: Physician Assistant

## 2014-01-05 DIAGNOSIS — K7460 Unspecified cirrhosis of liver: Secondary | ICD-10-CM | POA: Diagnosis present

## 2014-01-05 DIAGNOSIS — D638 Anemia in other chronic diseases classified elsewhere: Secondary | ICD-10-CM

## 2014-01-05 DIAGNOSIS — I509 Heart failure, unspecified: Secondary | ICD-10-CM | POA: Diagnosis not present

## 2014-01-05 DIAGNOSIS — F329 Major depressive disorder, single episode, unspecified: Secondary | ICD-10-CM | POA: Diagnosis present

## 2014-01-05 DIAGNOSIS — B9689 Other specified bacterial agents as the cause of diseases classified elsewhere: Secondary | ICD-10-CM | POA: Diagnosis present

## 2014-01-05 DIAGNOSIS — K589 Irritable bowel syndrome without diarrhea: Secondary | ICD-10-CM | POA: Diagnosis present

## 2014-01-05 DIAGNOSIS — K219 Gastro-esophageal reflux disease without esophagitis: Secondary | ICD-10-CM | POA: Diagnosis present

## 2014-01-05 DIAGNOSIS — Z794 Long term (current) use of insulin: Secondary | ICD-10-CM | POA: Diagnosis not present

## 2014-01-05 DIAGNOSIS — E1165 Type 2 diabetes mellitus with hyperglycemia: Secondary | ICD-10-CM

## 2014-01-05 DIAGNOSIS — R1031 Right lower quadrant pain: Secondary | ICD-10-CM

## 2014-01-05 DIAGNOSIS — M1990 Unspecified osteoarthritis, unspecified site: Secondary | ICD-10-CM | POA: Diagnosis present

## 2014-01-05 DIAGNOSIS — R7881 Bacteremia: Secondary | ICD-10-CM | POA: Diagnosis present

## 2014-01-05 DIAGNOSIS — Z833 Family history of diabetes mellitus: Secondary | ICD-10-CM | POA: Diagnosis not present

## 2014-01-05 DIAGNOSIS — K3189 Other diseases of stomach and duodenum: Secondary | ICD-10-CM | POA: Diagnosis present

## 2014-01-05 DIAGNOSIS — K435 Parastomal hernia without obstruction or  gangrene: Secondary | ICD-10-CM | POA: Diagnosis present

## 2014-01-05 DIAGNOSIS — E1140 Type 2 diabetes mellitus with diabetic neuropathy, unspecified: Secondary | ICD-10-CM | POA: Diagnosis present

## 2014-01-05 DIAGNOSIS — Z886 Allergy status to analgesic agent status: Secondary | ICD-10-CM | POA: Diagnosis not present

## 2014-01-05 DIAGNOSIS — I5032 Chronic diastolic (congestive) heart failure: Secondary | ICD-10-CM | POA: Diagnosis present

## 2014-01-05 DIAGNOSIS — D696 Thrombocytopenia, unspecified: Secondary | ICD-10-CM | POA: Diagnosis present

## 2014-01-05 DIAGNOSIS — Z950 Presence of cardiac pacemaker: Secondary | ICD-10-CM

## 2014-01-05 DIAGNOSIS — D519 Vitamin B12 deficiency anemia, unspecified: Secondary | ICD-10-CM

## 2014-01-05 DIAGNOSIS — Z79899 Other long term (current) drug therapy: Secondary | ICD-10-CM | POA: Diagnosis not present

## 2014-01-05 DIAGNOSIS — Z85118 Personal history of other malignant neoplasm of bronchus and lung: Secondary | ICD-10-CM | POA: Diagnosis not present

## 2014-01-05 DIAGNOSIS — G4733 Obstructive sleep apnea (adult) (pediatric): Secondary | ICD-10-CM | POA: Diagnosis present

## 2014-01-05 DIAGNOSIS — F419 Anxiety disorder, unspecified: Secondary | ICD-10-CM | POA: Diagnosis present

## 2014-01-05 DIAGNOSIS — Z8541 Personal history of malignant neoplasm of cervix uteri: Secondary | ICD-10-CM | POA: Diagnosis not present

## 2014-01-05 DIAGNOSIS — J9610 Chronic respiratory failure, unspecified whether with hypoxia or hypercapnia: Secondary | ICD-10-CM | POA: Diagnosis present

## 2014-01-05 DIAGNOSIS — Z885 Allergy status to narcotic agent status: Secondary | ICD-10-CM | POA: Diagnosis not present

## 2014-01-05 DIAGNOSIS — Z87891 Personal history of nicotine dependence: Secondary | ICD-10-CM | POA: Diagnosis not present

## 2014-01-05 DIAGNOSIS — R001 Bradycardia, unspecified: Secondary | ICD-10-CM | POA: Diagnosis present

## 2014-01-05 DIAGNOSIS — Z9081 Acquired absence of spleen: Secondary | ICD-10-CM | POA: Diagnosis present

## 2014-01-05 DIAGNOSIS — R1010 Upper abdominal pain, unspecified: Secondary | ICD-10-CM

## 2014-01-05 DIAGNOSIS — R109 Unspecified abdominal pain: Secondary | ICD-10-CM | POA: Diagnosis present

## 2014-01-05 DIAGNOSIS — J449 Chronic obstructive pulmonary disease, unspecified: Secondary | ICD-10-CM | POA: Diagnosis present

## 2014-01-05 DIAGNOSIS — N390 Urinary tract infection, site not specified: Secondary | ICD-10-CM | POA: Diagnosis present

## 2014-01-05 DIAGNOSIS — Z933 Colostomy status: Secondary | ICD-10-CM | POA: Diagnosis not present

## 2014-01-05 DIAGNOSIS — E785 Hyperlipidemia, unspecified: Secondary | ICD-10-CM | POA: Diagnosis present

## 2014-01-05 DIAGNOSIS — Z79891 Long term (current) use of opiate analgesic: Secondary | ICD-10-CM | POA: Diagnosis not present

## 2014-01-05 DIAGNOSIS — Z880 Allergy status to penicillin: Secondary | ICD-10-CM | POA: Diagnosis not present

## 2014-01-05 DIAGNOSIS — K921 Melena: Secondary | ICD-10-CM | POA: Diagnosis present

## 2014-01-05 DIAGNOSIS — K469 Unspecified abdominal hernia without obstruction or gangrene: Secondary | ICD-10-CM

## 2014-01-05 DIAGNOSIS — Z8249 Family history of ischemic heart disease and other diseases of the circulatory system: Secondary | ICD-10-CM | POA: Diagnosis not present

## 2014-01-05 DIAGNOSIS — B955 Unspecified streptococcus as the cause of diseases classified elsewhere: Secondary | ICD-10-CM | POA: Insufficient documentation

## 2014-01-05 DIAGNOSIS — K7581 Nonalcoholic steatohepatitis (NASH): Secondary | ICD-10-CM | POA: Diagnosis present

## 2014-01-05 DIAGNOSIS — I10 Essential (primary) hypertension: Secondary | ICD-10-CM | POA: Diagnosis present

## 2014-01-05 DIAGNOSIS — J45909 Unspecified asthma, uncomplicated: Secondary | ICD-10-CM | POA: Diagnosis present

## 2014-01-05 DIAGNOSIS — Z90710 Acquired absence of both cervix and uterus: Secondary | ICD-10-CM | POA: Diagnosis not present

## 2014-01-05 DIAGNOSIS — Z881 Allergy status to other antibiotic agents status: Secondary | ICD-10-CM | POA: Diagnosis not present

## 2014-01-05 DIAGNOSIS — Z9981 Dependence on supplemental oxygen: Secondary | ICD-10-CM | POA: Diagnosis not present

## 2014-01-05 LAB — COMPREHENSIVE METABOLIC PANEL
ALT: 28 U/L (ref 0–35)
AST: 35 U/L (ref 0–37)
Albumin: 2.7 g/dL — ABNORMAL LOW (ref 3.5–5.2)
Alkaline Phosphatase: 107 U/L (ref 39–117)
Anion gap: 12 (ref 5–15)
BUN: 15 mg/dL (ref 6–23)
CO2: 25 meq/L (ref 19–32)
Calcium: 10.5 mg/dL (ref 8.4–10.5)
Chloride: 106 mEq/L (ref 96–112)
Creatinine, Ser: 1.09 mg/dL (ref 0.50–1.10)
GFR, EST AFRICAN AMERICAN: 62 mL/min — AB (ref >90)
GFR, EST NON AFRICAN AMERICAN: 54 mL/min — AB (ref >90)
GLUCOSE: 233 mg/dL — AB (ref 70–99)
Potassium: 4.5 mEq/L (ref 3.7–5.3)
SODIUM: 143 meq/L (ref 137–147)
Total Bilirubin: 0.3 mg/dL (ref 0.3–1.2)
Total Protein: 6.2 g/dL (ref 6.0–8.3)

## 2014-01-05 LAB — CBC WITH DIFFERENTIAL/PLATELET
BASOS PCT: 2 % — AB (ref 0–1)
Basophils Absolute: 0.2 10*3/uL — ABNORMAL HIGH (ref 0.0–0.1)
EOS PCT: 11 % — AB (ref 0–5)
Eosinophils Absolute: 1.3 10*3/uL — ABNORMAL HIGH (ref 0.0–0.7)
HEMATOCRIT: 29.2 % — AB (ref 36.0–46.0)
HEMOGLOBIN: 9.2 g/dL — AB (ref 12.0–15.0)
LYMPHS PCT: 21 % (ref 12–46)
Lymphs Abs: 2.5 10*3/uL (ref 0.7–4.0)
MCH: 29.6 pg (ref 26.0–34.0)
MCHC: 31.5 g/dL (ref 30.0–36.0)
MCV: 93.9 fL (ref 78.0–100.0)
Monocytes Absolute: 2.3 10*3/uL — ABNORMAL HIGH (ref 0.1–1.0)
Monocytes Relative: 19 % — ABNORMAL HIGH (ref 3–12)
NEUTROS ABS: 5.8 10*3/uL (ref 1.7–7.7)
NEUTROS PCT: 47 % (ref 43–77)
Platelets: 378 10*3/uL (ref 150–400)
RBC: 3.11 MIL/uL — AB (ref 3.87–5.11)
RDW: 26.6 % — ABNORMAL HIGH (ref 11.5–15.5)
WBC: 12.1 10*3/uL — ABNORMAL HIGH (ref 4.0–10.5)

## 2014-01-05 LAB — HEMOGLOBIN A1C
Hgb A1c MFr Bld: 7.9 % — ABNORMAL HIGH (ref <5.7)
Mean Plasma Glucose: 180 mg/dL — ABNORMAL HIGH (ref <117)

## 2014-01-05 LAB — GLUCOSE, CAPILLARY
GLUCOSE-CAPILLARY: 288 mg/dL — AB (ref 70–99)
GLUCOSE-CAPILLARY: 262 mg/dL — AB (ref 70–99)
GLUCOSE-CAPILLARY: 297 mg/dL — AB (ref 70–99)
Glucose-Capillary: 180 mg/dL — ABNORMAL HIGH (ref 70–99)
Glucose-Capillary: 181 mg/dL — ABNORMAL HIGH (ref 70–99)
Glucose-Capillary: 205 mg/dL — ABNORMAL HIGH (ref 70–99)
Glucose-Capillary: 208 mg/dL — ABNORMAL HIGH (ref 70–99)

## 2014-01-05 LAB — PARATHYROID HORMONE, INTACT (NO CA): PTH: 128 pg/mL — AB (ref 14–64)

## 2014-01-05 LAB — VITAMIN D 25 HYDROXY (VIT D DEFICIENCY, FRACTURES): VIT D 25 HYDROXY: 13 ng/mL — AB (ref 30–100)

## 2014-01-05 MED ORDER — VANCOMYCIN HCL 10 G IV SOLR
1250.0000 mg | Freq: Two times a day (BID) | INTRAVENOUS | Status: DC
  Administered 2014-01-05 – 2014-01-07 (×4): 1250 mg via INTRAVENOUS
  Filled 2014-01-05 (×5): qty 1250

## 2014-01-05 MED ORDER — CETYLPYRIDINIUM CHLORIDE 0.05 % MT LIQD
7.0000 mL | Freq: Two times a day (BID) | OROMUCOSAL | Status: DC
  Administered 2014-01-05 – 2014-01-10 (×10): 7 mL via OROMUCOSAL

## 2014-01-05 MED ORDER — HYDROMORPHONE HCL 1 MG/ML IJ SOLN
1.0000 mg | INTRAMUSCULAR | Status: DC | PRN
  Administered 2014-01-05 – 2014-01-07 (×21): 1 mg via INTRAVENOUS
  Filled 2014-01-05 (×21): qty 1

## 2014-01-05 MED ORDER — VANCOMYCIN HCL IN DEXTROSE 750-5 MG/150ML-% IV SOLN
750.0000 mg | Freq: Once | INTRAVENOUS | Status: DC
  Filled 2014-01-05: qty 150

## 2014-01-05 NOTE — Plan of Care (Signed)
Problem: Phase I Progression Outcomes Goal: Voiding-avoid urinary catheter unless indicated Outcome: Completed/Met Date Met:  01/05/14     

## 2014-01-05 NOTE — Progress Notes (Signed)
[name] FWY:[id number] DOB: 09/18/1950 DOA: 01/04/2014 PCP: [name], MD  Brief narrative: 63 y/o ? 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 She was found to have gram positive cocci in chains on 2/2 blood cultures  history-As per Problem list Chart reviewed as below  Consultants:  Reviewed   Procedures: Gastroenterology   Antibiotics:  Rifaximin 12/8  Flagyl 12/8   Levaquin 12/8   Subjective  Alert oriented doesn't in no apparent distress tolerating clear liquids Very emotional and worried -reassured . States abdominal pain 5/10  No fever no chills Quality of pain is sharp with paroxysms that make it go up to 10/10. Patient unable to qualify pain any further    Objective    Interim History:Reviewed  Telemetry:    Objective: Filed Vitals:   01/04/14 2029 01/04/14 2046 01/05/14 0400 01/05/14 1013  BP: 136/44  149/46 139/60  Pulse: 88  87 76  Temp: 98.2 F (36.8 C)  98.3 F (36.8 C) 98.6 F (37 C)  TempSrc: Oral  Oral Oral  Resp: 18  17 18   Height: 5' (1.524 m)     Weight: 93.1 kg (205 lb 4 oz)     SpO2: 98% 97% 97% 98%    Intake/Output Summary (Last 24 hours) at 01/05/14 1412 Last data filed at 01/05/14 1239  Gross per 24 hour  Intake 290.83 ml  Output   2351 ml  Net -2060.17 ml    Exam:  General: Alert wasn't oriented no apparent distress Cardiovascular: S1-S2 no murmur rub or gallop Respiratory: Clinically clear Abdomen: Soft nontender ostomy intact but full of soft stool Skin no lower extremity edema Neuro intact  Data Reviewed: Basic Metabolic Panel:  Recent  Labs Lab 01/04/14 1405 01/04/14 2102 01/05/14 0525  NA 137  --  143  K 3.9  --  4.5  CL 96  --  106  CO2 26  --  25  GLUCOSE 285*  --  233*  BUN 18  --  15  CREATININE 1.17*  --  1.09  CALCIUM 11.5*  --  10.5  MG  --  1.9  --   PHOS  --  2.7  --    Liver Function Tests:  Recent Labs Lab 01/04/14 1405 01/05/14 0525  AST 45* 35  ALT 35 28  ALKPHOS 124* 107  BILITOT 0.4 0.3  PROT 6.9 6.2  ALBUMIN 3.3* 2.7*   No results for input(s): LIPASE, AMYLASE in the last 168 hours. No results for input(s): AMMONIA in the last 168 hours. CBC:  Recent Labs Lab 01/04/14 1405 01/05/14 0525  WBC 14.3* 12.1*  NEUTROABS 7.9* 5.8  HGB 9.7* 9.2*  HCT 30.8* 29.2*  MCV 90.3 93.9  PLT 387 378   Cardiac Enzymes: No results for input(s): CKTOTAL, CKMB, CKMBINDEX, TROPONINI in the last 168 hours. BNP: Invalid input(s): POCBNP CBG:  Recent Labs Lab 01/04/14 2025 01/05/14 0021 01/05/14 0400 01/05/14 0738 01/05/14 1153  GLUCAP 176* 181* 208* 180* 205*    Recent Results (from the past 240 hour(s))  Blood Culture (routine x 2)     Status: None (Preliminary result)   Collection Time: 01/04/14  2:05 PM  Result Value Ref Range Status   Specimen Description BLOOD RIGHT ANTECUBITAL  Final   Special Requests BOTTLES DRAWN AEROBIC AND ANAEROBIC 5CC  Final   Culture  Setup Time   Final    01/04/2014 17:59 Performed at [facility]    Culture   Final    [place] CHAINS Note: Gram Stain Report Called to,Read Back By and Verified With: CESE BOKIAGON 01/05/14 [id number] [place] Performed at [facility]    Report Status PENDING  Incomplete  Blood Culture (routine x 2)     Status: None (Preliminary result)   Collection Time: 01/04/14  2:15 PM  Result Value Ref Range Status   Specimen Description BLOOD LEFT ANTECUBITAL  Final   Special Requests BOTTLES DRAWN AEROBIC AND ANAEROBIC 5CC  Final   Culture  Setup Time   Final    01/04/2014 17:59 Performed at [facility]    Culture   Final    GRAM POSITIVE ORGANISM Note: Gram Stain Report Called to,Read Back By and Verified With: Virgilio Frees @ 66 ON [id number] BY [facility] Performed at [facility]    Report Status PENDING  Incomplete     Studies:              All Imaging reviewed and is as per above notation   Scheduled Meds: . antiseptic oral rinse  7 mL Mouth Rinse BID  . atorvastatin  20 mg Oral q morning - 10a  . dicyclomine  10 mg Oral TID  . enoxaparin (LOVENOX) injection  40 mg Subcutaneous Q24H  . folic acid  1 mg Oral QPM  . furosemide  80 mg Oral BID  . hydrALAZINE  37.5 mg Oral BID  . iron polysaccharides  150 mg Oral BID  . isosorbide dinitrate  20 mg Oral BID  . levofloxacin (LEVAQUIN) IV  750 mg Intravenous Q24H  . losartan  50 mg Oral q morning - 10a  . metoCLOPramide  10 mg Oral TID  . metronidazole  500 mg Intravenous Q8H  . mometasone-formoterol  2 puff Inhalation BID  . pantoprazole  40 mg Oral Daily  . rifaximin  550 mg Oral BID   Continuous Infusions:    Assessment/Plan:  1. Possible subacute upper GI bleed secondary to gastropathy-probably will need repeat endoscopy.  Gastroenterology to address. Last EGD 03/2012 confirmed presence of gastropathy however EGD 11/02/13 was negative.  Suspect a more durable plan may need to be in place to prevent this from occurring-Unclear if she is a candidate for a procedure such as TIPS or another type of arterial embolization? 2. Gram-positive bacteremia-continue current antibiotics Flagyl, Levaquin, rifaximin-will narrow once organism is more known Unclear source-urine is dirty but I do not suspect this as this was a she does have however a pacemaker, so I will inquire from infectious disease best course of action from ID-Dr. Megan Salon recommends a TTE/TEE and I will consult Cardiology regarding this to assist.  Much appreciate input 3. 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  For now patient to continue other medications  4. 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 No evidences of anasarca. Consider utilization of's Aldactone as per GI discretion. Rifaximin mean is a second line therapy and ideally the patient should be on lactulose prior to transition to this.  Strict ins and outs 5. Mild COPD-continue oxygen therapy, dulera 2 puffs twice a day ,  DuoNeb 3 mils every 4 when necessary, 6. Hypertension-continue losartan 50 mg every morning, hydralazine 37.5 twice a day, Lasix 80 twice a day, Imdur 20 twice a day.moderate controlled  7.  anxiety-continue diazepam 5 mg 3 times a day when necessary-will need CBT As Outpatient per primary physician  8.  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 History of iron deficiency anemia-monitor labs.    Code Status: full Family Communication:  None present Disposition Plan: inpatient   Verneita Griffes, MD  [facility] Pager [phone] 01/05/2014, 2:12 PM    LOS: 1 day

## 2014-01-05 NOTE — Consult Note (Signed)
[facility] Consult: 12:04 PM 01/05/2014  LOS: 1 day    Referring Provider: Dr Verlon Au  Primary Care Physician:  [name], MD Primary Gastroenterologist:  Dr. [name] since  03/2011.    Reason for Consultation:  Abdominal pain.    HPI: [name] is a 63 y.o. female.  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  Chronic anemia and bleeding per ostomy.  S/p multiple abdominal/pelvic surgeries including but not limited to 10/2005 left colectomy and end colostomy after colonoscopy perforation at time of acute diverticulitis, 05/2839 laparotomy/umbilical hernia repair for SBO, 01/2013 splenectomy for splenomegaly and attempt to reduce incidences of bleeding per ostomy.  S/p cholecystectomy. Has parastomal hernia.  08/2013 cardiac pacemaker for symptomatic bradycardia. S/p LUL pneumonectomy for squamous cell cancer. Type 2 IDDM.  Thrombocytopenia.  COPD.   GI related/impacting meds include Rifaximin, Bentyl, Nexium, Lasix 80 BID, Ferrex, Reglan tid, Oxycodone, Phenergan prn.   Hgb of 6.9 09/16/2013.  Transfused with PRBCs then and in late 2014, early 2015.   9.4 on 10/31.  8.2 on 11/12. Has been treated with oral and IV iron.     Workups for chronic, intermittent bleeding per ostomy and anemia include:  11/02/2013 EGD:   Normal.  02/2013 CT abdomen: peristomal hernia.  03/2012 Enteroscopy at [facility]: portal gastropathy, which was possible source of bleeding.  03/2011 Capsule endo:  Few tiny red spots throughout SB but no bleeding  03/2011 Colonoscopy via ostomy: mild inflammation of ostomy stump.  03/2011 EGD:  Portal gastropathy and gastric vascular ectasia.    Pt has felt the stoma was the source of  bleeding. At last GI office visit 09/2013, plan was to refer pt to [facility] for evaluation of peristomal hernia. She is followed by hematology.   Admitted 12/8 with worsening of chronic abdominal pain.  The pain is located at the stoma but radiates throughout the entire abdomen. Pain was less well controlled with her usual oxycodone. Here in the hospital it is redder controlled with IV Dilaudid. Over the hours of sleep between 12/7 and 12/8 she had larger than usual volume of bleeding into the ostomy.  She also reports nausea but no emesis. Generally she has bleeding into the ostomy at least a couple of times per week.. Temps of 99.9 in last 2 weeks.   Hgb 9.7.  WBCs 14.3. Glucose 280s. LFTs mild to moderately elevated but improved.  Urine with +++ bacteria, 3-6 WBCs, no nitrites, trace leukocytes. On Flagyl, Levaquin IV Blood clx and urine gram stains are both + for GPCs in chains.     Past Medical History  Diagnosis Date  . Cirrhosis   . GERD (gastroesophageal reflux disease)   . Cervical disc syndrome     trouble turnng neck at times  . Chronic respiratory failure   . Diverticulitis of colon   . Perforation of colon   . IBS (irritable bowel syndrome)   . Chronic lower GI bleeding   .  Overactive bladder   . Thrombocytopenia     sees Dr. Julien Nordmann   . Splenomegaly   . Depression   . Hypertension   . Asthma   . Heart murmur   . Hyperlipidemia   . COPD (chronic obstructive pulmonary disease)     sees Dr. Gwenette Greet   . Colostomy care   . Hypercalcemia   . CHF (congestive heart failure)   . Pacemaker 12/14/2013  . 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 Cervical cancer many years ago  . Complication of anesthesia     woke up during colonscopy and endoscopy in past  . History of blood transfusion "several"    "bleeding via ostomy" (01/04/2014)  . On home oxygen therapy     "2L; 24/7" (01/04/2014)  . Pneumonia "1-2 times"  . Chronic bronchitis "several  times"  . Sleep apnea 2012    mild, no cpap needed  . Type II diabetes mellitus     sees Dr. Cruzita Lederer   . Chronic disease anemia     sees Dr. Julien Nordmann, due to chronic disease and GI losses   . History of stomach ulcers   . Migraine     "last one was several years ago" (01/04/2014)  . Arthritis     "tailbone; hands; legs" (01/04/2014)  . Anxiety   . Pericardial effusion 2007, 2015.   [name] Chronic abdominal pain     IBS    Past Surgical History  Procedure Laterality Date  . Colostomy  11/06/2005  . Lung removal, partial Left 2004    upper lobe removed  . Left colectomy  11/06/2005    Hartmann resection of sigmoid colon and end colostomy.  . Esophagogastroduodenoscopy  03/19/2011    Procedure: ESOPHAGOGASTRODUODENOSCOPY (EGD);  Surgeon: [name], MD;  Location: [facility];  Service: Endoscopy;  Laterality: N/A;  . Colonoscopy  03/19/2011    Procedure: COLONOSCOPY;  Surgeon: [name], MD;  Location: [facility];  Service: Endoscopy;  Laterality: N/A;  . Givens capsule study  03/20/2011    Procedure: GIVENS CAPSULE STUDY;  Surgeon: [name], MD;  Location: [facility];  Service: Endoscopy;  Laterality: N/A;  . Small bowel obstruction repair  March 2012  . Umbilical hernia repair  March 2012  . Splenectomy, total N/A 02/24/2013    Procedure: SPLENECTOMY;  Surgeon: Harl Bowie, MD;  Location: [place];  Service: General;  Laterality: N/A;  . Orif patella Left 04/21/2013    Procedure: OPEN REDUCTION INTERNAL (ORIF) FIXATION PATELLA;  Surgeon: Augustin Schooling, MD;  Location: [place];  Service: Orthopedics;  Laterality: Left;  . Pacemaker insertion  2015  . Esophagogastroduodenoscopy (egd) with propofol N/A 11/02/2013    Procedure: ESOPHAGOGASTRODUODENOSCOPY (EGD) WITH PROPOFOL;  Surgeon: [name], MD;  Location: [facility];  Service: Endoscopy;  Laterality: N/A;  EGD with APC  . Hot hemostasis N/A 11/02/2013    Procedure: HOT HEMOSTASIS (ARGON PLASMA  COAGULATION/BICAP);  Surgeon: [name], MD;  Location: [facility];  Service: Endoscopy;  Laterality: N/A;  . Colon surgery    . Hernia repair    . Fracture surgery    . Abdominal hysterectomy    . Tubal ligation    . Tonsillectomy  1959  . Appendectomy  1962  . Cholecystectomy    . Cardiac catheterization  1967    Prior to Admission medications   Medication Sig Start Date End Date Taking? Authorizing Provider  albuterol-ipratropium (COMBIVENT)  18-103 MCG/ACT inhaler Inhale 2 puffs into the lungs every 4 (four) hours as needed for wheezing or shortness of breath.    Yes Historical Provider, MD  atorvastatin (LIPITOR) 20 MG tablet Take 20 mg by mouth every morning.   Yes Historical Provider, MD  diazepam (VALIUM) 5 MG tablet Take 1 tablet (5 mg total) by mouth 3 (three) times daily as needed for anxiety. 07/29/13  Yes [name], MD  dicyclomine (BENTYL) 10 MG capsule Take 10 mg by mouth 3 (three) times daily.    Yes Historical Provider, MD  esomeprazole (NEXIUM) 40 MG capsule Take 40 mg by mouth every evening.   Yes Historical Provider, MD  Fluticasone-Salmeterol (ADVAIR) 250-50 MCG/DOSE AEPB Inhale 1 puff into the lungs every 12 (twelve) hours. 04/12/13  Yes [name], MD  folic acid (FOLVITE) 1 MG tablet Take 1 tablet (1 mg total) by mouth every evening. 04/12/13  Yes [name], MD  furosemide (LASIX) 40 MG tablet Take 2 tablets (80 mg total) by mouth 2 (two) times daily. 10/05/13  Yes [name], MD  hydrALAZINE (APRESOLINE) 25 MG tablet Take 1.5 tablets (37.5 mg total) by mouth 2 (two) times daily. 10/13/13  Yes [name], MD  insulin regular human CONCENTRATED (HUMULIN R) 500 UNIT/ML SOLN injection Inject under skin 0.30 mL before b'fast, 0.25 mL before lunch and 0.22 mL before dinner 12/28/13  Yes Philemon Kingdom, MD  INSULIN SYRINGE 1CC/29G (B-D INSULIN SYRINGE) 29G X 1/2" 1 ML MISC Use to inject insulin 3 times daily. 11/23/13  Yes Philemon Kingdom, MD  iron  polysaccharides (FERREX 150) 150 MG capsule Take 1 capsule (150 mg total) by mouth 2 (two) times daily. 10/07/13  Yes [name], MD  isosorbide dinitrate (ISORDIL) 20 MG tablet Take 1 tablet (20 mg total) by mouth 2 (two) times daily. 10/13/13  Yes [name], MD  losartan (COZAAR) 50 MG tablet Take 50 mg by mouth every morning.   Yes Historical Provider, MD  metFORMIN (GLUCOPHAGE) 500 MG tablet Take 1 tablet (500 mg total) by mouth 2 (two) times daily with a meal. 12/28/13  Yes Philemon Kingdom, MD  methocarbamol (ROBAXIN) 500 MG tablet Take 1 tablet (500 mg total) by mouth every 6 (six) hours as needed for muscle spasms. 05/03/13  Yes Ivan Anchors Love, PA-C  metoCLOPramide (REGLAN) 10 MG tablet Take 1 tablet (10 mg total) by mouth 3 (three) times daily. 04/12/13  Yes [name], MD  Oxycodone HCl 20 MG TABS Take 1 tablet (20 mg total) by mouth every 3 (three) hours as needed (pain). 10/13/13  Yes [name], MD  promethazine (PHENERGAN) 25 MG tablet Take 1 tablet (25 mg total) by mouth every 4 (four) hours as needed for nausea or vomiting. 11/23/13  Yes [name], MD  Syringe/Needle, Disp, 25G X 1" 1 ML MISC Use 3x a day 11/22/13  Yes Philemon Kingdom, MD  levofloxacin (LEVAQUIN) 500 MG tablet Take 1 tablet (500 mg total) by mouth daily. Patient not taking: Reported on 01/04/2014 12/15/13   [name], MD  rifaximin (XIFAXAN) 550 MG TABS tablet Take 550 mg by mouth 2 (two) times daily. 06/29/12   [name], MD    Scheduled Meds: . antiseptic oral rinse  7 mL Mouth Rinse BID  . atorvastatin  20 mg Oral q morning - 10a  . dicyclomine  10 mg Oral TID  . enoxaparin (LOVENOX) injection  40 mg Subcutaneous  P50D  . folic acid  1 mg Oral QPM  . furosemide  80 mg Oral BID  . hydrALAZINE  37.5 mg Oral BID  . iron polysaccharides  150 mg Oral BID  . isosorbide dinitrate  20 mg Oral BID  . levofloxacin (LEVAQUIN) IV  750 mg Intravenous Q24H  . losartan  50 mg Oral q morning - 10a  .  metoCLOPramide  10 mg Oral TID  . metronidazole  500 mg Intravenous Q8H  . mometasone-formoterol  2 puff Inhalation BID  . pantoprazole  40 mg Oral Daily  . rifaximin  550 mg Oral BID   Infusions:   PRN Meds: diazepam, HYDROmorphone (DILAUDID) injection, ipratropium-albuterol, methocarbamol, oxyCODONE, promethazine   Allergies as of 01/04/2014 - Review Complete 01/04/2014  Allergen Reaction Noted  . Acetaminophen Other (See Comments) 06/23/2013  . Morphine Other (See Comments) 09/15/2006  . Other Other (See Comments) 02/10/2013  . Penicillins Anaphylaxis and Rash 06/03/2006  . Codeine phosphate Other (See Comments) 06/03/2006  . Hydrocodone-acetaminophen Other (See Comments) 06/03/2006  . Morphine and related  11/27/2013  . Trazodone and nefazodone  09/03/2013  . Cephalexin Swelling and Rash 06/03/2006  . Hydrocodone-acetaminophen Other (See Comments) 06/23/2013    Family History  Problem Relation Age of Onset  . Coronary artery disease    . Diabetes type II    . Anesthesia problems Neg Hx   . Hypotension Neg Hx   . Malignant hyperthermia Neg Hx   . Pseudochol deficiency Neg Hx   . Heart attack Mother   . Diabetes type II Mother   . Cirrhosis Father     History   Social History  . Marital Status: Divorced    Spouse Name: N/A    Number of Children: N/A  . Years of Education: N/A   Occupational History  . Disabled    Social History Main Topics  . Smoking status: Former Smoker -- 2.00 packs/day for 35 years    Types: Cigarettes    Quit date: 03/24/2002  . Smokeless tobacco: Never Used  . Alcohol Use: No  . Drug Use: No  . Sexual Activity: No   Other Topics Concern  . Not on file   Social History Narrative   Regular exercise: a little   Caffeine use: 2 cups of coffee in am          REVIEW OF SYSTEMS: Constitutional:  Some weakness, lacking energy. ENT:  No nose bleeds Pulm:  No cough or increased DOE CV:  No palpitations, occasional LE edema which  is more pronounced of late and less responsive to diuretic therapy.  GU:  No hematuria, no frequency.  No blood tinged urine GI:  AFP normal 08/2010.  Heme:  Per history of present illness   Transfusions:  Per history of present illness Neuro:  No headaches, + peripheral tingling/numbness Derm:  No itching, no rash or sores.  Endocrine:  No sweats or chills.  No polyuria or dysuria Immunization:  Flu, meningococcal, pneumococcal, T dap Vaccinations are all up-to-date. Travel:  None beyond local counties in last few months.    PHYSICAL EXAM: Vital signs in last 24 hours: Filed Vitals:   01/05/14 1013  BP: 139/60  Pulse: 76  Temp: 98.6 F (37 C)  Resp: 18   Wt Readings from Last 3 Encounters:  01/04/14 205 lb 4 oz (93.1 kg)  01/04/14 203 lb (92.08 kg)  12/28/13 197 lb (89.359 kg)   General: Chronically but not acutely ill appearing. Comfortable Head:  No swelling, no asymmetry, no signs of trauma  Eyes:  No conjunctival pallor, no icterus. EOMI. Ears:  No gross hearing deficits.  Nose:  No congestion or discharge. Mouth:  Clear, moist.  No sores, blood or exudates Neck:  No JVD, no thyromegaly, no masses Lungs:  Clear to A&P bilaterally. No dyspnea, no cough Heart: RRR, no MRG. S1/S2 audible Abdomen:  Large, nondistended. The stool in ostomy is red/pink tinged medium brown, soft to liquid texture. There is a peristomal hernia.   Rectal: Not performed.   Musc/Skeltl: No joint swelling, redness or contracture Extremities:  Mild, non pitting pedal edema.  Neurologic:  Alert and oriented 3. No tremor. No limb weakness or asymmetric strength. No tremor. Skin:  No telangiectasia, no sores, no rash Tattoos:  None Nodes:  No cervical adenopathy   Psych:  Pleasant, cooperative, not depressed or anxious.  Intake/Output from previous day: 12/08 0701 - 12/09 0700 In: 290.8 [I.V.:190.8; IV Piggyback:100] Out: 851 [Urine:850; Stool:1] Intake/Output this shift:    LAB  RESULTS:  Recent Labs  01/04/14 1405 01/05/14 0525  WBC 14.3* 12.1*  HGB 9.7* 9.2*  HCT 30.8* 29.2*  PLT 387 378   BMET Lab Results  Component Value Date   NA 143 01/05/2014   NA 137 01/04/2014   NA 137 11/27/2013   K 4.5 01/05/2014   K 3.9 01/04/2014   K 3.8 11/27/2013   CL 106 01/05/2014   CL 96 01/04/2014   CL 96 11/27/2013   CO2 25 01/05/2014   CO2 26 01/04/2014   CO2 28 11/27/2013   GLUCOSE 233* 01/05/2014   GLUCOSE 285* 01/04/2014   GLUCOSE 212* 11/27/2013   BUN 15 01/05/2014   BUN 18 01/04/2014   BUN 29* 11/27/2013   CREATININE 1.09 01/05/2014   CREATININE 1.17* 01/04/2014   CREATININE 1.02 11/27/2013   CALCIUM 10.5 01/05/2014   CALCIUM 11.5* 01/04/2014   CALCIUM 10.6* 11/27/2013   LFT  Recent Labs  01/04/14 1405 01/05/14 0525  PROT 6.9 6.2  ALBUMIN 3.3* 2.7*  AST 45* 35  ALT 35 28  ALKPHOS 124* 107  BILITOT 0.4 0.3   PT/INR Lab Results  Component Value Date   INR 1.07 01/04/2014   INR 1.11 09/03/2013   INR 1.05 05/19/2013    RADIOLOGY STUDIES: Ct Abdomen Pelvis W Contrast 01/04/2014     COMPARISON:  CT abdomen pelvis 11/29/2013  FINDINGS: Lung bases are clear aside from mild scarring. Cardiac enlargement. Pacemaker noted.  Cirrhotic liver with nodular contour of the capsule. The left lobe liver is enlarged and the liver is enlarged overall. Gallbladder is surgically absent. Spleen has been removed. Small regenerating splenules are stable. No varices are noted.  The kidneys show no obstruction or mass or stone. Pancreas is negative for mass or edema. Common bile duct nondilated  Left colostomy. Large peristomal hernia containing bowel loops. No bowel obstruction.  No free fluid. No mass or adenopathy. No significant spinal abnormality.  IMPRESSION: Cirrhotic liver without ascites.  Large peristomal hernia on the left without bowel obstruction.   Electronically Signed   By: Franchot Gallo M.D.   On: 01/04/2014 16:39   Dg Chest Port 1  View 01/04/2014     COMPARISON:  Chest radiographs 11/27/2013 and earlier.  FINDINGS: Portable AP semi upright view at 1434 hrs. Large body habitus. Lung with elevation of the left hemidiaphragm. Stable left chest cardiac pacemaker. Stable volume loss in the left Stable cardiac size and mediastinal contours. No pneumothorax. No  pleural effusion or consolidation. Chronic but increased increased interstitial markings/vascular congestion. No overt edema.  IMPRESSION: Acute on chronic increased interstitial markings could relate to pulmonary vascular congestion or viral/atypical respiratory infection.   Electronically Signed   By: Lars Pinks M.D.   On: 01/04/2014 15:00    ENDOSCOPIC STUDIES: Per HPI.    IMPRESSION:   *  Acute on chronic abdominal pain and hx IBS, leukocytosis, GPCs in chains on blood and urine gram stain, large peristomal hernia on CT.  Started on Levaquin, Flagyl.   *  Chronic intermittent anemia requiring periodic transfusion and bleeding per ostomy.  Extensive work up with findings of portal gastropathy, gastric angioectasia, peristomal hernia.   Bleeding accelerated along with abdominal pain in last 2 weeks.  No evidence of colitis, diverticulitis on CT.   *  Cirrhosis due to NASH and ? contribution of APAP induced liver injury.  *  IDDM.  Sugars not well controlled currently.   *  Increased interstitial pulmonary markings.  ? Respiratory infection.  EF in 06/2013: 60 to 01%, grade 2 diastolic dysfunction, small to moderated pericardial effusion.   *  S/p multiple abdominal and pelvic surgeries.    PLAN:     *  Per Dr Henrene Pastor.  Pt not eager for repeat colonoscopy.   *  Obtain records of surgical office visit with Dr. Johney Maine on 10/01/2013. Addendum:  Obtained records and Dr Johney Maine who did not feel bleeding was stomal in origin, he suspected bleeding from portal gastropathy and wondered about repeating the EGD.     Azucena Freed  01/05/2014, 12:04 PM Pager: [phone]  GI  ATTENDING  History, laboratories, prior endoscopy reports reviewed. X-rays reviewed. Patient personally seen and examined. Agree with history and physical as outlined above.  IMPRESSION/PLAN  1. Chronic anemia. Multifactorial. Not overwhelmingly different from baseline 2. Intermittent bleeding per ostomy. The vast majority of the time this is isolated streaks of blood or streaks of blood with normal-looking stool. More recently with clots but no stool. Based on this description, and a current contents in her ostomy bag, and the previous extensive negative workups, this is almost certainly a very distal process. Suspect stomal irritation at the most distal aspect of her colon. The patient is not in favor of relook colonoscopy, which I think is reasonable. 3. Chronic abdominal pain. Difficult to ascertain how much of this is related to the peristomal hernia, or adhesions, or some other process that has eluded diagnosis. I suspect some portion is related to the parastomal hernia and some more chronic and possibly related to adhesions. Not sure if surgery has weighed in on this question or not. Recommend surgical opinion while she is inpatient. 4. Gram-positive bacteremia. This explains recent problems with malaise and fevers. Possibly from the urine. This needs to be worked up by the primary service.  Discussed with patient impression and recommendations as outlined above. Will sign off, but are available as needed. Outpatient follow-up with Dr. [name] as needed  Docia Chuck. Geri Seminole., M.D. [facility] Division of Gastroenterology

## 2014-01-05 NOTE — Consult Note (Addendum)
CARDIOLOGY CONSULT NOTE   Patient ID: [name] MRN: [id number], DOB/AGE: 06/15/1950   Admit date: 01/04/2014 Date of Consult: 01/05/2014   Primary Physician: [name], MD Primary Cardiologist: Dr. Lovena Le  Pt. Profile  obese 63 year old Caucasian female with multiple comorbidities include HTN, HLD, DM, cirrhosis, chronic anemia secondary to GI bleed, COPD on home oxygen, obstructive sleep apnea, chronic diastolic heart failure with preserved EF, and history of symptomatic sinus nodal dysfunction status post St. Jude dual-chamber pacemaker in August 2015 present with bacteremia  Problem List  Past Medical History  Diagnosis Date  . Cirrhosis   . GERD (gastroesophageal reflux disease)   . Cervical disc syndrome     trouble turnng neck at times  . Chronic respiratory failure   . Diverticulitis of colon   . Perforation of colon   . IBS (irritable bowel syndrome)   . Chronic lower GI bleeding   . Overactive bladder   . Thrombocytopenia     sees Dr. Julien Nordmann   . Splenomegaly   . Depression   . Hypertension   . Asthma   . Heart murmur   . Hyperlipidemia   . COPD (chronic obstructive pulmonary disease)     sees Dr. Gwenette Greet   . Colostomy care   . Hypercalcemia   . CHF (congestive heart failure)   . Pacemaker 12/14/2013  . 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 Cervical cancer many years ago  . Complication of anesthesia     woke up during colonscopy and endoscopy in past  . History of blood transfusion "several"    "bleeding via ostomy" (01/04/2014)  . On home oxygen therapy     "2L; 24/7" (01/04/2014)  . Pneumonia "1-2 times"  . Chronic bronchitis "several times"  . Sleep apnea 2012    mild, no cpap needed  . Type II diabetes mellitus     sees Dr. Cruzita Lederer   . Chronic disease anemia     sees Dr. Julien Nordmann, due to chronic disease and GI losses   . History of stomach ulcers   . Migraine     "last one was several years ago" (01/04/2014)    . Arthritis     "tailbone; hands; legs" (01/04/2014)  . Anxiety   . Pericardial effusion 2007, 2015.   [name] Chronic abdominal pain     IBS    Past Surgical History  Procedure Laterality Date  . Colostomy  11/06/2005  . Lung removal, partial Left 2004    upper lobe removed  . Left colectomy  11/06/2005    Hartmann resection of sigmoid colon and end colostomy.  . Esophagogastroduodenoscopy  03/19/2011    Procedure: ESOPHAGOGASTRODUODENOSCOPY (EGD);  Surgeon: [name], MD;  Location: [facility];  Service: Endoscopy;  Laterality: N/A;  . Colonoscopy  03/19/2011    Procedure: COLONOSCOPY;  Surgeon: [name], MD;  Location: [facility];  Service: Endoscopy;  Laterality: N/A;  . Givens capsule study  03/20/2011    Procedure: GIVENS CAPSULE STUDY;  Surgeon: [name], MD;  Location: [facility];  Service: Endoscopy;  Laterality: N/A;  . Small bowel obstruction repair  March 2012  . Umbilical hernia repair  March 2012  . Splenectomy, total N/A 02/24/2013    Procedure: SPLENECTOMY;  Surgeon: Harl Bowie, MD;  Location: [facility];  Service: General;  Laterality: N/A;  . Orif patella Left 04/21/2013    Procedure: OPEN REDUCTION INTERNAL (ORIF) FIXATION PATELLA;  Surgeon:  Augustin Schooling, MD;  Location: [facility];  Service: Orthopedics;  Laterality: Left;  . Pacemaker insertion  2015  . Esophagogastroduodenoscopy (egd) with propofol N/A 11/02/2013    Procedure: ESOPHAGOGASTRODUODENOSCOPY (EGD) WITH PROPOFOL;  Surgeon: [name], MD;  Location: [facility];  Service: Endoscopy;  Laterality: N/A;  EGD with APC  . Hot hemostasis N/A 11/02/2013    Procedure: HOT HEMOSTASIS (ARGON PLASMA COAGULATION/BICAP);  Surgeon: [name], MD;  Location: [facility];  Service: Endoscopy;  Laterality: N/A;  . Colon surgery    . Hernia repair    . Fracture surgery    . Abdominal hysterectomy    . Tubal ligation    . Tonsillectomy  1959  . Appendectomy  1962  . Cholecystectomy    .  Cardiac catheterization  1967     Allergies  Allergies  Allergen Reactions  . Acetaminophen Other (See Comments)    Cirrhosis of liver  . Morphine Other (See Comments)    REACTION: Lowers BP  . Other Other (See Comments)    AGENT:  Per pt, cannot take blood thinners due to cirrhosis of the liver  . Penicillins Anaphylaxis and Rash  . Codeine Phosphate Other (See Comments)    REACTION: Stomach cramps  . Hydrocodone-Acetaminophen Other (See Comments)    REACTION: hallucinations  . Morphine And Related     Blood pressure drops   . Trazodone And Nefazodone     Cardiac arrythmia - DO NOT USE  . Cephalexin Swelling and Rash  . Hydrocodone-Acetaminophen Other (See Comments)    unknown    HPI   The patient is an obese 63 year old Caucasian female with multiple comorbidities include HTN, HLD, DM, cirrhosis, chronic anemia secondary to GI bleed, COPD on home oxygen, obstructive sleep apnea, chronic diastolic heart failure with preserved EF, and history of symptomatic sinus nodal dysfunction status post St. Jude dual-chamber pacemaker in August 2015. It appears, she had a perforated colon during colonoscopy in the setting of diverticulitis around 2007 and underwent colectomy and colostomy. She has noticed occasional blood in her colostomy bag for the past 3 years. She has underwent extensive GI workup which were unrevealing. Her last echocardiogram on 07/13/2013 showed EF 16-10%, grade 2 diastolic dysfunction, small to moderate pericardial effusion. After her St. Jude dual-chamber pacemaker placement in August 2015, she has been followed by Dr. Lovena Le. Her last follow-up was on 12/14/2013, at which time her pacemaker was functioning normally.   For the last 2 weeks, patient has been having intermittent fever along with increase in abdominal discomfort, fatigue and weakness. Her symptom was worsening which prompted the patient to seek medical attention at [facility] on 01/04/2014. On  arrival, significant laboratory finding include creatinine 1.17, glucose 285, alkaline phosphatase 124, white blood cell count 14.3, hemoglobin 9.7. Urinalysis was positive for many bacteria, however negative nitrite. Blood culture was obtained which came back positive for gram-positive cocci. Urine cultures currently pending. Initial lactic acid was 3.12. Patient was placed on emperic antibiotic. GI was consulted for anemia workup, however it appears patient already had extensive workup prior to this. CT of the abdomen shows large peristomal hernia without sign of bowel obstruction. Infectious disease has been consulted for bacteremia. Cardiology was consulted for endocarditis workup in this patient who had a recent pacemaker placement.   Inpatient Medications  . antiseptic oral rinse  7 mL Mouth Rinse BID  . atorvastatin  20 mg Oral q morning - 10a  . dicyclomine  10 mg  Oral TID  . enoxaparin (LOVENOX) injection  40 mg Subcutaneous Q24H  . folic acid  1 mg Oral QPM  . furosemide  80 mg Oral BID  . hydrALAZINE  37.5 mg Oral BID  . iron polysaccharides  150 mg Oral BID  . isosorbide dinitrate  20 mg Oral BID  . losartan  50 mg Oral q morning - 10a  . metoCLOPramide  10 mg Oral TID  . mometasone-formoterol  2 puff Inhalation BID  . pantoprazole  40 mg Oral Daily  . vancomycin  1,250 mg Intravenous Q12H  . [START ON 01/06/2014] vancomycin  750 mg Intravenous Once    Family History Family History  Problem Relation Age of Onset  . Coronary artery disease    . Diabetes type II    . Anesthesia problems Neg Hx   . Hypotension Neg Hx   . Malignant hyperthermia Neg Hx   . Pseudochol deficiency Neg Hx   . Heart attack Mother   . Diabetes type II Mother   . Cirrhosis Father      Social History History   Social History  . Marital Status: Divorced    Spouse Name: N/A    Number of Children: N/A  . Years of Education: N/A   Occupational History  . Disabled    Social History Main  Topics  . Smoking status: Former Smoker -- 2.00 packs/day for 35 years    Types: Cigarettes    Quit date: 03/24/2002  . Smokeless tobacco: Never Used  . Alcohol Use: No  . Drug Use: No  . Sexual Activity: No   Other Topics Concern  . Not on file   Social History Narrative   Regular exercise: a little   Caffeine use: 2 cups of coffee in am           Review of Systems  General:  No night sweats or weight changes. +chills, fever Cardiovascular:  No chest pain, orthopnea, palpitations, paroxysmal nocturnal dyspnea. +edema, dyspnea on exertion Dermatological: No rash, lesions/masses Respiratory: No cough, dyspnea Urologic: No hematuria, dysuria Abdominal:   No nausea, vomiting, diarrhea. Colostomy bad noted, some bleeding problem. Neurologic:  No visual changes, changes in mental status. +wkns All other systems reviewed and are otherwise negative except as noted above.  Physical Exam  Blood pressure 139/60, pulse 76, temperature 98.6 F (37 C), temperature source Oral, resp. rate 18, height 5' (1.524 m), weight 205 lb 4 oz (93.1 kg), SpO2 98 %.  General: Pleasant, NAD Psych: Normal affect. Neuro: Alert and oriented X 3. Moves all extremities spontaneously. HEENT: Normal  Neck: Supple without bruits or JVD.  Lungs:  Resp regular and unlabored, CTA.  Heart: RRR no s3, s4, or murmurs. Abdomen: Soft, non-distended, BS + x 4. Colostomy Extremities: No clubbing, cyanosis or edema. DP/PT/Radials 2+ and equal bilaterally.  Labs  No results for input(s): CKTOTAL, CKMB, TROPONINI in the last 72 hours. Lab Results  Component Value Date   WBC 12.1* 01/05/2014   HGB 9.2* 01/05/2014   HCT 29.2* 01/05/2014   MCV 93.9 01/05/2014   PLT 378 01/05/2014     Recent Labs Lab 01/05/14 0525  NA 143  K 4.5  CL 106  CO2 25  BUN 15  CREATININE 1.09  CALCIUM 10.5  PROT 6.2  BILITOT 0.3  ALKPHOS 107  ALT 28  AST 35  GLUCOSE 233*   Lab Results  Component Value Date   CHOL 173  10/05/2013   HDL 45.10 10/05/2013  LDLCALC 111* 10/05/2013   TRIG 86.0 10/05/2013   Lab Results  Component Value Date   DDIMER 2.21* 02/24/2013    Radiology/Studies  Ct Abdomen Pelvis W Contrast  01/04/2014   CLINICAL DATA:  Abdominal pain.  Colostomy.  Fever.  Cirrhosis.  EXAM: CT ABDOMEN AND PELVIS WITH CONTRAST  TECHNIQUE: Multidetector CT imaging of the abdomen and pelvis was performed using the standard protocol following bolus administration of intravenous contrast.  CONTRAST:  134mL OMNIPAQUE IOHEXOL 300 MG/ML  SOLN  COMPARISON:  CT abdomen pelvis 11/29/2013  FINDINGS: Lung bases are clear aside from mild scarring. Cardiac enlargement. Pacemaker noted.  Cirrhotic liver with nodular contour of the capsule. The left lobe liver is enlarged and the liver is enlarged overall. Gallbladder is surgically absent. Spleen has been removed. Small regenerating splenules are stable. No varices are noted.  The kidneys show no obstruction or mass or stone. Pancreas is negative for mass or edema. Common bile duct nondilated  Left colostomy. Large peristomal hernia containing bowel loops. No bowel obstruction.  No free fluid. No mass or adenopathy. No significant spinal abnormality.  IMPRESSION: Cirrhotic liver without ascites.  Large peristomal hernia on the left without bowel obstruction.   Electronically Signed   By: Franchot Gallo M.D.   On: 01/04/2014 16:39   Dg Chest Port 1 View  01/04/2014   CLINICAL DATA:  63 year old female with upper abdominal pain, nausea, weakness, fever. Initial encounter. History of partial left pneumonectomy due to lung cancer.  EXAM: PORTABLE CHEST - 1 VIEW  COMPARISON:  Chest radiographs 11/27/2013 and earlier.  FINDINGS: Portable AP semi upright view at 1434 hrs. Large body habitus. Lung with elevation of the left hemidiaphragm. Stable left chest cardiac pacemaker. Stable volume loss in the left Stable cardiac size and mediastinal contours. No pneumothorax. No pleural  effusion or consolidation. Chronic but increased increased interstitial markings/vascular congestion. No overt edema.  IMPRESSION: Acute on chronic increased interstitial markings could relate to pulmonary vascular congestion or viral/atypical respiratory infection.   Electronically Signed   By: Lars Pinks M.D.   On: 01/04/2014 15:00    ECG  Sinus tach with heart rate 99   ASSESSMENT AND PLAN  1. G+ Cocci Bacteremia:  - potential complication for TEE include she has history of anesthesia complication, notably waking up in the middle of colonoscopy and EGD despite sedation. She has been able to complete the procedures under propofol. Potentially will need to get anesthesiology Department involved to do TEE  - If positive for endocarditis, potentially will need EP to remove pacemaker  - Discussed with Dr. Lovena Le regarding this patient, her WBC is improving, afebrile since yesterday, clinically improving, no immediate indication for TEE today but may need to be arranged before discharge. Not a good candidate for TTE given body habitus. Will ask EP to see in AM.   - ID to manage bacteremia  2. Chronic anemia 2/2 GI bleed  - CT of abdomen noted large left peristomal hernia without bowel obstruction , h/o multiple GI workup here and [facility]  - GI following, may consider further workup, pending final decision  3. history of symptomatic sinus nodal junction status post St. Jude dual-chamber pacemaker in August 2015  4. Hypertension 5. Hyperlipidemia 6. DM  7. Cirrhosis 8. COPD on home oxygen 9. Obstructive sleep apnea 10. history of heart failure with preserved ejection fraction 11. H/o anesthesia complication with waking up during colonoscopy and endoscopy  Signed, MCALHANY,CHRISTOPHER, PA-C 01/05/2014, 5:42 PM   I  have personally seen and examined this patient with Almyra Deforest, PA-C. I agree with the assessment and plan as outlined above. She has a recently implanted pacemaker. Now with gram  positive bacteremia. UTI is likely source. No formal ID consult in place but question if TEE would be beneficial to exclude pacemaker lead vegetation. She is now afebrile and WBC count is improving. Will make NPO at midnight tonight and will have EP see in am to decide if we should proceed with TEE. Given gram positive bacteremia, there is high risk for pacemaker lead involvement. Cannot schedule TEE at this hour. If the decision is made to proceed with TEE, she will need involvement of anesthesia as she has had issues with sedation in the past. Continue antibiotics.   MCALHANY,CHRISTOPHER 01/05/2014 5:42 PM

## 2014-01-05 NOTE — Progress Notes (Signed)
CRITICAL VALUE ALERT  Critical value received: (+)Blood culture,anaerobic bottle growing gram (+) cocci in chains Date of notification:  01/05/14  Time of notification:  06:57 Critical value read back:Yes.    Nurse who received alert:  Daphene Calamity  MD notified (1st page):  Dr. Verlon Au  Time of first page:  0734  MD notified (2nd page):  Time of second page:  Responding MD:  Dr. Verlon Au  Time MD responded:  07:34

## 2014-01-05 NOTE — Progress Notes (Signed)
ANTIBIOTIC CONSULT NOTE - INITIAL  Pharmacy Consult for Vancomycin Indication: Rule Out Endocarditis  Allergies  Allergen Reactions  . Acetaminophen Other (See Comments)    Cirrhosis of liver  . Morphine Other (See Comments)    REACTION: Lowers BP  . Other Other (See Comments)    AGENT:  Per pt, cannot take blood thinners due to cirrhosis of the liver  . Penicillins Anaphylaxis and Rash  . Codeine Phosphate Other (See Comments)    REACTION: Stomach cramps  . Hydrocodone-Acetaminophen Other (See Comments)    REACTION: hallucinations  . Morphine And Related     Blood pressure drops   . Trazodone And Nefazodone     Cardiac arrythmia - DO NOT USE  . Cephalexin Swelling and Rash  . Hydrocodone-Acetaminophen Other (See Comments)    unknown    Patient Measurements: Height: 5' (152.4 cm) Weight: 205 lb 4 oz (93.1 kg) IBW/kg (Calculated) : 45.5 Adjusted Body Weight: 64.5 kg  Vital Signs: Temp: 98.6 F (37 C) (12/09 1013) Temp Source: Oral (12/09 1013) BP: 139/60 mmHg (12/09 1013) Pulse Rate: 76 (12/09 1013) Intake/Output from previous day: 12/08 0701 - 12/09 0700 In: 290.8 [I.V.:190.8; IV Piggyback:100] Out: 851 [Urine:850; Stool:1] Intake/Output from this shift: Total I/O In: -  Out: 1500 [Urine:1500]  Labs:  Recent Labs  01/04/14 1405 01/05/14 0525  WBC 14.3* 12.1*  HGB 9.7* 9.2*  PLT 387 378  CREATININE 1.17* 1.09   Estimated Creatinine Clearance: 55.2 mL/min (by C-G formula based on Cr of 1.09). No results for input(s): VANCOTROUGH, VANCOPEAK, VANCORANDOM, GENTTROUGH, GENTPEAK, GENTRANDOM, TOBRATROUGH, TOBRAPEAK, TOBRARND, AMIKACINPEAK, AMIKACINTROU, AMIKACIN in the last 72 hours.   Microbiology: Recent Results (from the past 720 hour(s))  TECHNOLOGIST REVIEW     Status: None   Collection Time: 12/09/13 10:36 AM  Result Value Ref Range Status   Technologist Review Few fragments and acanthocytes  Final  Blood Culture (routine x 2)     Status: None  (Preliminary result)   Collection Time: 01/04/14  2:05 PM  Result Value Ref Range Status   Specimen Description BLOOD RIGHT ANTECUBITAL  Final   Special Requests BOTTLES DRAWN AEROBIC AND ANAEROBIC 5CC  Final   Culture  Setup Time   Final    01/04/2014 17:59 Performed at [facility]    Culture   Final    [place] Note: Gram Stain Report Called to,Read Back By and Verified With: CESE BOKIAGON 01/05/14 [id number] [place] Performed at [facility]    Report Status PENDING  Incomplete  Blood Culture (routine x 2)     Status: None (Preliminary result)   Collection Time: 01/04/14  2:15 PM  Result Value Ref Range Status   Specimen Description BLOOD LEFT ANTECUBITAL  Final   Special Requests BOTTLES DRAWN AEROBIC AND ANAEROBIC 5CC  Final   Culture  Setup Time   Final    01/04/2014 17:59 Performed at [facility]    Culture   Final    GRAM POSITIVE ORGANISM Note: Gram Stain Report Called to,Read Back By and Verified With: Virgilio Frees @ 50 ON [id number] BY [facility] Performed at [facility]    Report Status PENDING  Incomplete    Medical History: Past Medical History  Diagnosis Date  . Cirrhosis   . GERD (gastroesophageal reflux disease)   . Cervical disc syndrome     trouble turnng neck at times  . Chronic respiratory failure   . Diverticulitis of colon   .  Perforation of colon   . IBS (irritable bowel syndrome)   . Chronic lower GI bleeding   . Overactive bladder   . Thrombocytopenia     sees Dr. Julien Nordmann   . Splenomegaly   . Depression   . Hypertension   . Asthma   . Heart murmur   . Hyperlipidemia   . COPD (chronic obstructive pulmonary disease)     sees Dr. Gwenette Greet   . Colostomy care   . Hypercalcemia   . CHF (congestive heart failure)   . Pacemaker 12/14/2013  . 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 Cervical cancer many years ago  . Complication of anesthesia     woke up during colonscopy and  endoscopy in past  . History of blood transfusion "several"    "bleeding via ostomy" (01/04/2014)  . On home oxygen therapy     "2L; 24/7" (01/04/2014)  . Pneumonia "1-2 times"  . Chronic bronchitis "several times"  . Sleep apnea 2012    mild, no cpap needed  . Type II diabetes mellitus     sees Dr. Cruzita Lederer   . Chronic disease anemia     sees Dr. Julien Nordmann, due to chronic disease and GI losses   . History of stomach ulcers   . Migraine     "last one was several years ago" (01/04/2014)  . Arthritis     "tailbone; hands; legs" (01/04/2014)  . Anxiety   . Pericardial effusion 2007, 2015.   [name] Chronic abdominal pain     IBS    Medications:  Prescriptions prior to admission  Medication Sig Dispense Refill Last Dose  . albuterol-ipratropium (COMBIVENT) 18-103 MCG/ACT inhaler Inhale 2 puffs into the lungs every 4 (four) hours as needed for wheezing or shortness of breath.    01/04/2014 at Unknown time  . atorvastatin (LIPITOR) 20 MG tablet Take 20 mg by mouth every morning.   01/04/2014 at Unknown time  . diazepam (VALIUM) 5 MG tablet Take 1 tablet (5 mg total) by mouth 3 (three) times daily as needed for anxiety. 90 tablet 5 01/04/2014 at Unknown time  . dicyclomine (BENTYL) 10 MG capsule Take 10 mg by mouth 3 (three) times daily.    01/04/2014 at Unknown time  . esomeprazole (NEXIUM) 40 MG capsule Take 40 mg by mouth every evening.   01/03/2014 at Unknown time  . Fluticasone-Salmeterol (ADVAIR) 250-50 MCG/DOSE AEPB Inhale 1 puff into the lungs every 12 (twelve) hours. 180 each 3 01/04/2014 at Unknown time  . folic acid (FOLVITE) 1 MG tablet Take 1 tablet (1 mg total) by mouth every evening. 90 tablet 3 01/03/2014 at Unknown time  . furosemide (LASIX) 40 MG tablet Take 2 tablets (80 mg total) by mouth 2 (two) times daily. 120 tablet 3 01/04/2014 at Unknown time  . hydrALAZINE (APRESOLINE) 25 MG tablet Take 1.5 tablets (37.5 mg total) by mouth 2 (two) times daily. 270 tablet 3 01/04/2014 at 7a  .  insulin regular human CONCENTRATED (HUMULIN R) 500 UNIT/ML SOLN injection Inject under skin 0.30 mL before b'fast, 0.25 mL before lunch and 0.22 mL before dinner 20 mL 3 01/04/2014 at Unknown time  . INSULIN SYRINGE 1CC/29G (B-D INSULIN SYRINGE) 29G X 1/2" 1 ML MISC Use to inject insulin 3 times daily. 100 each 3 01/04/2014 at Unknown time  . iron polysaccharides (FERREX 150) 150 MG capsule Take 1 capsule (150 mg total) by mouth 2 (two) times daily. 60 capsule 3 01/04/2014  at Unknown time  . isosorbide dinitrate (ISORDIL) 20 MG tablet Take 1 tablet (20 mg total) by mouth 2 (two) times daily. 180 tablet 3 01/04/2014 at Unknown time  . losartan (COZAAR) 50 MG tablet Take 50 mg by mouth every morning.   01/04/2014 at Unknown time  . metFORMIN (GLUCOPHAGE) 500 MG tablet Take 1 tablet (500 mg total) by mouth 2 (two) times daily with a meal. 90 tablet 1 01/04/2014 at Unknown time  . methocarbamol (ROBAXIN) 500 MG tablet Take 1 tablet (500 mg total) by mouth every 6 (six) hours as needed for muscle spasms. 60 tablet 0 01/04/2014 at Unknown time  . metoCLOPramide (REGLAN) 10 MG tablet Take 1 tablet (10 mg total) by mouth 3 (three) times daily. 270 tablet 3 01/04/2014 at Unknown time  . Oxycodone HCl 20 MG TABS Take 1 tablet (20 mg total) by mouth every 3 (three) hours as needed (pain). 240 tablet 0 01/04/2014 at Unknown time  . promethazine (PHENERGAN) 25 MG tablet Take 1 tablet (25 mg total) by mouth every 4 (four) hours as needed for nausea or vomiting. 120 tablet 2 01/04/2014 at Unknown time  . Syringe/Needle, Disp, 25G X 1" 1 ML MISC Use 3x a day 200 each 3 01/04/2014 at Unknown time  . levofloxacin (LEVAQUIN) 500 MG tablet Take 1 tablet (500 mg total) by mouth daily. (Patient not taking: Reported on 01/04/2014) 10 tablet 0 not started yet  . rifaximin (XIFAXAN) 550 MG TABS tablet Take 550 mg by mouth 2 (two) times daily.   Not Taking at Unknown time   Scheduled:  . antiseptic oral rinse  7 mL Mouth Rinse BID  .  atorvastatin  20 mg Oral q morning - 10a  . dicyclomine  10 mg Oral TID  . enoxaparin (LOVENOX) injection  40 mg Subcutaneous Q24H  . folic acid  1 mg Oral QPM  . furosemide  80 mg Oral BID  . hydrALAZINE  37.5 mg Oral BID  . iron polysaccharides  150 mg Oral BID  . isosorbide dinitrate  20 mg Oral BID  . losartan  50 mg Oral q morning - 10a  . metoCLOPramide  10 mg Oral TID  . mometasone-formoterol  2 puff Inhalation BID  . pantoprazole  40 mg Oral Daily   Assessment: 63yo female admitted for acute on chronic CHF exacerbation and abdominal pain was found to have GPC on 2/2 blood cultures. Pharmacy is consulted to dose vancomycin for rule out endocarditis while waiting for TTE/TEE. Pt is currently on Flagyl/Levaquin for possible intra-abdominal infection. Pt is afebrile, WBC 12.1, sCr 1.09 with CrCl ~ 61 mL/min.  Goal of Therapy:  Vancomycin trough level 15-20 mcg/ml  Plan:  Vancomycin 1250mg  IV load followed by 750mg  IV q12h Measure antibiotic drug levels at steady state Follow up culture results  Andrey Cota. Diona Foley, PharmD Clinical Pharmacist Pager [phone] 01/05/2014,4:09 PM

## 2014-01-05 NOTE — Progress Notes (Signed)
Removed and returned telemetry box 10 per orders.  CCMD notified.

## 2014-01-05 NOTE — Consult Note (Signed)
Reason for Consult:abd pain for three years Referring Physician: Dr Scarlette Shorts  [name] is an 63 y.o. female.  HPI: 59 yof with extensive psh.  She also has significant pmh including cirrhosis.  She has abdominal surgical history of left colectomy/hartmanns after colonoscopic perforation (I think Dr Zettie Pho), May 2012 had laparotomy for sbo, had splenectomy earlier this year by Dr Ninfa Linden for splenomegaly.  She also has cholecystectomy. Her ostomy is functional. She eats well at home and drinks just fine. She has constant abdominal pain that is only helped by dilaudid right now but this is no different per her report.  There are no associated symptoms or anything she can tell that brings it on. 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  She has extensive workup including upper and lower endoscopy as well as a capsule endoscopy with no definitive source of blood per stoma. She says several days ago she had blood all over her from her stoma. She is currently undergoing evaluation for gram positive bacteremia and consideration of tee.   Past Medical History  Diagnosis Date  . Cirrhosis   . GERD (gastroesophageal reflux disease)   . Cervical disc syndrome     trouble turnng neck at times  . Chronic respiratory failure   . Diverticulitis of colon   . Perforation of colon   . IBS (irritable bowel syndrome)   . Chronic lower GI bleeding   . Overactive bladder   . Thrombocytopenia     sees Dr. Julien Nordmann   . Splenomegaly   . Depression   . Hypertension   . Asthma   . Heart murmur   . Hyperlipidemia   . COPD (chronic obstructive pulmonary disease)     sees Dr. Gwenette Greet   . Colostomy care   . Hypercalcemia   . CHF (congestive heart failure)   . Pacemaker 12/14/2013  . 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 Cervical cancer many years ago  . Complication of anesthesia     woke up during colonscopy and endoscopy in past  . History of  blood transfusion "several"    "bleeding via ostomy" (01/04/2014)  . On home oxygen therapy     "2L; 24/7" (01/04/2014)  . Pneumonia "1-2 times"  . Chronic bronchitis "several times"  . Sleep apnea 2012    mild, no cpap needed  . Type II diabetes mellitus     sees Dr. Cruzita Lederer   . Chronic disease anemia     sees Dr. Julien Nordmann, due to chronic disease and GI losses   . History of stomach ulcers   . Migraine     "last one was several years ago" (01/04/2014)  . Arthritis     "tailbone; hands; legs" (01/04/2014)  . Anxiety   . Pericardial effusion 2007, 2015.   [name] Chronic abdominal pain     IBS    Past Surgical History  Procedure Laterality Date  . Colostomy  11/06/2005  . Lung removal, partial Left 2004    upper lobe removed  . Left colectomy  11/06/2005    Hartmann resection of sigmoid colon and end colostomy.  . Esophagogastroduodenoscopy  03/19/2011    Procedure: ESOPHAGOGASTRODUODENOSCOPY (EGD);  Surgeon: [name], MD;  Location: [facility];  Service: Endoscopy;  Laterality: N/A;  . Colonoscopy  03/19/2011    Procedure: COLONOSCOPY;  Surgeon: [name], MD;  Location: [facility];  Service: Endoscopy;  Laterality:  N/A;  . Givens capsule study  03/20/2011    Procedure: GIVENS CAPSULE STUDY;  Surgeon: [name], MD;  Location: [facility];  Service: Endoscopy;  Laterality: N/A;  . Small bowel obstruction repair  March 2012  . Umbilical hernia repair  March 2012  . Splenectomy, total N/A 02/24/2013    Procedure: SPLENECTOMY;  Surgeon: Harl Bowie, MD;  Location: [place];  Service: General;  Laterality: N/A;  . Orif patella Left 04/21/2013    Procedure: OPEN REDUCTION INTERNAL (ORIF) FIXATION PATELLA;  Surgeon: Augustin Schooling, MD;  Location: [place];  Service: Orthopedics;  Laterality: Left;  . Pacemaker insertion  2015  . Esophagogastroduodenoscopy (egd) with propofol N/A 11/02/2013    Procedure: ESOPHAGOGASTRODUODENOSCOPY (EGD) WITH PROPOFOL;  Surgeon: [name], MD;  Location: [facility];  Service: Endoscopy;  Laterality: N/A;  EGD with APC  . Hot hemostasis N/A 11/02/2013    Procedure: HOT HEMOSTASIS (ARGON PLASMA COAGULATION/BICAP);  Surgeon: [name], MD;  Location: [facility];  Service: Endoscopy;  Laterality: N/A;  . Colon surgery    . Hernia repair    . Fracture surgery    . Abdominal hysterectomy    . Tubal ligation    . Tonsillectomy  1959  . Appendectomy  1962  . Cholecystectomy    . Cardiac catheterization  1967    Family History  Problem Relation Age of Onset  . Coronary artery disease    . Diabetes type II    . Anesthesia problems Neg Hx   . Hypotension Neg Hx   . Malignant hyperthermia Neg Hx   . Pseudochol deficiency Neg Hx   . Heart attack Mother   . Diabetes type II Mother   . Cirrhosis Father     Social History:  reports that she quit smoking about 11 years ago. Her smoking use included Cigarettes. She has a 70 pack-year smoking history. She has never used smokeless tobacco. She reports that she does not drink alcohol or use illicit drugs.  Allergies:  Allergies  Allergen Reactions  . Acetaminophen Other (See Comments)    Cirrhosis of liver  . Morphine Other (See Comments)    REACTION: Lowers BP  . Other Other (See Comments)    AGENT:  Per pt, cannot take blood thinners due to cirrhosis of the liver  . Penicillins Anaphylaxis and Rash  . Codeine Phosphate Other (See Comments)    REACTION: Stomach cramps  . Hydrocodone-Acetaminophen Other (See Comments)    REACTION: hallucinations  . Morphine And Related     Blood pressure drops   . Trazodone And Nefazodone     Cardiac arrythmia - DO NOT USE  . Cephalexin Swelling and Rash  . Hydrocodone-Acetaminophen Other (See Comments)    unknown    Medications: I have reviewed the patient's current medications.  Results for orders placed or performed during the hospital encounter of 01/04/14 (from the past 48 hour(s))  Glucose, capillary     Status:  Abnormal   Collection Time: 01/04/14  1:21 PM  Result Value Ref Range   Glucose-Capillary 288 (H) 70 - 99 mg/dL   Comment 1 Notify RN   Blood Culture (routine x 2)     Status: None (Preliminary result)   Collection Time: 01/04/14  2:05 PM  Result Value Ref Range   Specimen Description BLOOD RIGHT ANTECUBITAL    Special Requests BOTTLES DRAWN AEROBIC AND ANAEROBIC 5CC    Culture  Setup Time  01/04/2014 17:59 Performed at [facility] [place] Note: Gram Stain Report Called to,Read Back By and Verified With: CESE BOKIAGON 01/05/14 [id number] [place] Performed at [facility]    Report Status PENDING   CBC WITH DIFFERENTIAL     Status: Abnormal   Collection Time: 01/04/14  2:05 PM  Result Value Ref Range   WBC 14.3 (H) 4.0 - 10.5 K/uL   RBC 3.41 (L) 3.87 - 5.11 MIL/uL   Hemoglobin 9.7 (L) 12.0 - 15.0 g/dL   HCT 30.8 (L) 36.0 - 46.0 %   MCV 90.3 78.0 - 100.0 fL   MCH 28.4 26.0 - 34.0 pg   MCHC 31.5 30.0 - 36.0 g/dL   RDW 26.5 (H) 11.5 - 15.5 %   Platelets 387 150 - 400 K/uL   Neutrophils Relative % 55 43 - 77 %   Lymphocytes Relative 21 12 - 46 %   Monocytes Relative 16 (H) 3 - 12 %   Eosinophils Relative 7 (H) 0 - 5 %   Basophils Relative 1 0 - 1 %   Neutro Abs 7.9 (H) 1.7 - 7.7 K/uL   Lymphs Abs 3.0 0.7 - 4.0 K/uL   Monocytes Absolute 2.3 (H) 0.1 - 1.0 K/uL   Eosinophils Absolute 1.0 (H) 0.0 - 0.7 K/uL   Basophils Absolute 0.1 0.0 - 0.1 K/uL   RBC Morphology HOWELL/JOLLY BODIES     Comment: ACANTHOCYTES   Smear Review LARGE PLATELETS PRESENT   Comprehensive metabolic panel     Status: Abnormal   Collection Time: 01/04/14  2:05 PM  Result Value Ref Range   Sodium 137 137 - 147 mEq/L   Potassium 3.9 3.7 - 5.3 mEq/L   Chloride 96 96 - 112 mEq/L   CO2 26 19 - 32 mEq/L   Glucose, Bld 285 (H) 70 - 99 mg/dL   BUN 18 6 - 23 mg/dL   Creatinine, Ser 1.17 (H) 0.50 - 1.10 mg/dL   Calcium 11.5 (H) 8.4 - 10.5 mg/dL   Total  Protein 6.9 6.0 - 8.3 g/dL   Albumin 3.3 (L) 3.5 - 5.2 g/dL   AST 45 (H) 0 - 37 U/L   ALT 35 0 - 35 U/L   Alkaline Phosphatase 124 (H) 39 - 117 U/L   Total Bilirubin 0.4 0.3 - 1.2 mg/dL   GFR calc non Af Amer 49 (L) >90 mL/min   GFR calc Af Amer 57 (L) >90 mL/min    Comment: (NOTE) The eGFR has been calculated using the CKD EPI equation. This calculation has not been validated in all clinical situations. eGFR's persistently <90 mL/min signify possible Chronic Kidney Disease.    Anion gap 15 5 - 15  Urine culture     Status: None (Preliminary result)   Collection Time: 01/04/14  2:10 PM  Result Value Ref Range   Specimen Description URINE, RANDOM    Special Requests NONE    Culture  Setup Time      01/04/2014 21:44 Performed at [place]      >=100,000 COLONIES/ML Performed at [place] Performed at [facility]    Report Status PENDING   Blood Culture (routine x 2)     Status: None (Preliminary result)   Collection Time: 01/04/14  2:15 PM  Result Value Ref Range  Specimen Description BLOOD LEFT ANTECUBITAL    Special Requests BOTTLES DRAWN AEROBIC AND ANAEROBIC 5CC    Culture  Setup Time      01/04/2014 17:59 Performed at [place] Note: Gram Stain Report Called to,Read Back By and Verified With: Virgilio Frees @ [phone] ON [id number] BY [facility] Performed at [facility]    Report Status PENDING   Urinalysis, Routine w reflex microscopic     Status: Abnormal   Collection Time: 01/04/14  2:15 PM  Result Value Ref Range   Color, Urine YELLOW YELLOW   APPearance HAZY (A) CLEAR   Specific Gravity, Urine 1.008 1.005 - 1.030   pH 6.5 5.0 - 8.0   Glucose, UA 250 (A) NEGATIVE mg/dL   Hgb urine dipstick NEGATIVE NEGATIVE   Bilirubin Urine NEGATIVE NEGATIVE   Ketones, ur NEGATIVE NEGATIVE mg/dL   Protein, ur NEGATIVE NEGATIVE mg/dL    Urobilinogen, UA 0.2 0.0 - 1.0 mg/dL   Nitrite NEGATIVE NEGATIVE   Leukocytes, UA TRACE (A) NEGATIVE  Urine microscopic-add on     Status: Abnormal   Collection Time: 01/04/14  2:15 PM  Result Value Ref Range   Squamous Epithelial / LPF RARE RARE   WBC, UA 3-6 <3 WBC/hpf   RBC / HPF 0-2 <3 RBC/hpf   Bacteria, UA MANY (A) RARE  I-stat troponin, ED     Status: None   Collection Time: 01/04/14  2:25 PM  Result Value Ref Range   Troponin i, poc 0.01 0.00 - 0.08 ng/mL   Comment 3            Comment: Due to the release kinetics of cTnI, a negative result within the first hours of the onset of symptoms does not rule out myocardial infarction with certainty. If myocardial infarction is still suspected, repeat the test at appropriate intervals.   I-Stat CG4 Lactic Acid, ED     Status: Abnormal   Collection Time: 01/04/14  2:28 PM  Result Value Ref Range   Lactic Acid, Venous 3.12 (H) 0.5 - 2.2 mmol/L  I-Stat CG4 Lactic Acid, ED     Status: None   Collection Time: 01/04/14  4:43 PM  Result Value Ref Range   Lactic Acid, Venous 1.91 0.5 - 2.2 mmol/L  Glucose, capillary     Status: Abnormal   Collection Time: 01/04/14  8:25 PM  Result Value Ref Range   Glucose-Capillary 176 (H) 70 - 99 mg/dL  Parathyroid hormone, intact (no Ca)     Status: Abnormal   Collection Time: 01/04/14  9:02 PM  Result Value Ref Range   PTH 128 (H) 14 - 64 pg/mL    Comment: (NOTE) Interpretive Guide:                             Intact PTH               Calcium                             ----------               ------- Normal Parathyroid           Normal                   Normal Hypoparathyroidism  Low or Low Normal        Low Hyperparathyroidism     Primary                 Normal or High           High     Secondary               High                     Normal or Low     Tertiary                High                     High Non-Parathyroid  Hypercalcemia              Low or Low Normal         High **Please note change in methodology. If re-baselining is needed, for patients who are being serially tested, please call customer service, within 3 days of collection, to request to add on the appropriate re-baselining test code for the previous methodology, at no charge.** Performed at [facility]   Magnesium     Status: None   Collection Time: 01/04/14  9:02 PM  Result Value Ref Range   Magnesium 1.9 1.5 - 2.5 mg/dL  Phosphorus     Status: None   Collection Time: 01/04/14  9:02 PM  Result Value Ref Range   Phosphorus 2.7 2.3 - 4.6 mg/dL  TSH     Status: None   Collection Time: 01/04/14  9:02 PM  Result Value Ref Range   TSH 0.412 0.350 - 4.500 uIU/mL  Vit D  25 hydroxy (routine osteoporosis monitoring)     Status: Abnormal   Collection Time: 01/04/14  9:02 PM  Result Value Ref Range   Vit D, 25-Hydroxy 13 (L) 30 - 100 ng/mL    Comment: (NOTE) ** Please note change in reference range(s). ** Vitamin D Status           25-OH Vitamin D       Deficiency                <20 ng/mL       Insufficiency         20 - 29 ng/mL       Optimal             > or = 30 ng/mL For 25-OH Vitamin D testing on patients on D2-supplementation and patients for whom quantitation of D2 and D3 fractions is required, the QuestAssureD 25-OH VIT D, (D2,D3), LC/MS/MS is recommended: order code [id number] (patients > 2 yrs). Performed at [facility]   Sedimentation rate     Status: Abnormal   Collection Time: 01/04/14  9:02 PM  Result Value Ref Range   Sed Rate 40 (H) 0 - 22 mm/hr  APTT     Status: None   Collection Time: 01/04/14  9:02 PM  Result Value Ref Range   aPTT 29 24 - 37 seconds  Protime-INR     Status: None   Collection Time: 01/04/14  9:02 PM  Result Value Ref Range   Prothrombin Time 14.1 11.6 - 15.2 seconds   INR 1.07 0.00 - 1.49  Hemoglobin A1c     Status: Abnormal   Collection Time: 01/04/14  9:02 PM  Result Value Ref Range   Hgb A1c MFr  Bld 7.9 (H) <5.7 %     Comment: (NOTE)                                                                       According to the ADA Clinical Practice Recommendations for 2011, when HbA1c is used as a screening test:  >=6.5%   Diagnostic of Diabetes Mellitus           (if abnormal result is confirmed) 5.7-6.4%   Increased risk of developing Diabetes Mellitus References:Diagnosis and Classification of Diabetes Mellitus,Diabetes EUMP,5361,44(RXVQM 1):S62-S69 and Standards of Medical Care in         Diabetes - 2011,Diabetes GQQP,6195,09 (Suppl 1):S11-S61.    Mean Plasma Glucose 180 (H) <117 mg/dL    Comment: Performed at [facility]  Glucose, capillary     Status: Abnormal   Collection Time: 01/05/14 12:21 AM  Result Value Ref Range   Glucose-Capillary 181 (H) 70 - 99 mg/dL  Glucose, capillary     Status: Abnormal   Collection Time: 01/05/14  4:00 AM  Result Value Ref Range   Glucose-Capillary 208 (H) 70 - 99 mg/dL  CBC with Differential     Status: Abnormal   Collection Time: 01/05/14  5:25 AM  Result Value Ref Range   WBC 12.1 (H) 4.0 - 10.5 K/uL    Comment: WHITE COUNT CONFIRMED ON SMEAR REPEATED TO VERIFY    RBC 3.11 (L) 3.87 - 5.11 MIL/uL   Hemoglobin 9.2 (L) 12.0 - 15.0 g/dL   HCT 29.2 (L) 36.0 - 46.0 %   MCV 93.9 78.0 - 100.0 fL   MCH 29.6 26.0 - 34.0 pg   MCHC 31.5 30.0 - 36.0 g/dL   RDW 26.6 (H) 11.5 - 15.5 %   Platelets 378 150 - 400 K/uL    Comment: PLATELET COUNT CONFIRMED BY SMEAR REPEATED TO VERIFY    Neutrophils Relative % 47 43 - 77 %   Lymphocytes Relative 21 12 - 46 %   Monocytes Relative 19 (H) 3 - 12 %   Eosinophils Relative 11 (H) 0 - 5 %   Basophils Relative 2 (H) 0 - 1 %   Neutro Abs 5.8 1.7 - 7.7 K/uL   Lymphs Abs 2.5 0.7 - 4.0 K/uL   Monocytes Absolute 2.3 (H) 0.1 - 1.0 K/uL   Eosinophils Absolute 1.3 (H) 0.0 - 0.7 K/uL   Basophils Absolute 0.2 (H) 0.0 - 0.1 K/uL   RBC Morphology HOWELL/JOLLY BODIES     Comment: PAPPENHEIMER BODIES   WBC Morphology ATYPICAL  LYMPHOCYTES   Comprehensive metabolic panel     Status: Abnormal   Collection Time: 01/05/14  5:25 AM  Result Value Ref Range   Sodium 143 137 - 147 mEq/L   Potassium 4.5 3.7 - 5.3 mEq/L   Chloride 106 96 - 112 mEq/L    Comment: DELTA CHECK NOTED   CO2 25 19 - 32 mEq/L   Glucose, Bld 233 (H) 70 - 99 mg/dL   BUN 15 6 - 23 mg/dL   Creatinine, Ser 1.09 0.50 - 1.10 mg/dL   Calcium 10.5 8.4 - 10.5 mg/dL   Total Protein 6.2 6.0 - 8.3 g/dL   Albumin 2.7 (L) 3.5 - 5.2 g/dL   AST  35 0 - 37 U/L   ALT 28 0 - 35 U/L   Alkaline Phosphatase 107 39 - 117 U/L   Total Bilirubin 0.3 0.3 - 1.2 mg/dL   GFR calc non Af Amer 54 (L) >90 mL/min   GFR calc Af Amer 62 (L) >90 mL/min    Comment: (NOTE) The eGFR has been calculated using the CKD EPI equation. This calculation has not been validated in all clinical situations. eGFR's persistently <90 mL/min signify possible Chronic Kidney Disease.    Anion gap 12 5 - 15  Glucose, capillary     Status: Abnormal   Collection Time: 01/05/14  7:38 AM  Result Value Ref Range   Glucose-Capillary 180 (H) 70 - 99 mg/dL  Glucose, capillary     Status: Abnormal   Collection Time: 01/05/14 11:53 AM  Result Value Ref Range   Glucose-Capillary 205 (H) 70 - 99 mg/dL  Glucose, capillary     Status: Abnormal   Collection Time: 01/05/14  6:08 PM  Result Value Ref Range   Glucose-Capillary 262 (H) 70 - 99 mg/dL    Ct Abdomen Pelvis W Contrast  01/04/2014   CLINICAL DATA:  Abdominal pain.  Colostomy.  Fever.  Cirrhosis.  EXAM: CT ABDOMEN AND PELVIS WITH CONTRAST  TECHNIQUE: Multidetector CT imaging of the abdomen and pelvis was performed using the standard protocol following bolus administration of intravenous contrast.  CONTRAST:  128m OMNIPAQUE IOHEXOL 300 MG/ML  SOLN  COMPARISON:  CT abdomen pelvis 11/29/2013  FINDINGS: Lung bases are clear aside from mild scarring. Cardiac enlargement. Pacemaker noted.  Cirrhotic liver with nodular contour of the capsule. The left  lobe liver is enlarged and the liver is enlarged overall. Gallbladder is surgically absent. Spleen has been removed. Small regenerating splenules are stable. No varices are noted.  The kidneys show no obstruction or mass or stone. Pancreas is negative for mass or edema. Common bile duct nondilated  Left colostomy. Large peristomal hernia containing bowel loops. No bowel obstruction.  No free fluid. No mass or adenopathy. No significant spinal abnormality.  IMPRESSION: Cirrhotic liver without ascites.  Large peristomal hernia on the left without bowel obstruction.   Electronically Signed   By: CFranchot GalloM.D.   On: 01/04/2014 16:39   Dg Chest Port 1 View  01/04/2014   CLINICAL DATA:  63year old female with upper abdominal pain, nausea, weakness, fever. Initial encounter. History of partial left pneumonectomy due to lung cancer.  EXAM: PORTABLE CHEST - 1 VIEW  COMPARISON:  Chest radiographs 11/27/2013 and earlier.  FINDINGS: Portable AP semi upright view at 1434 hrs. Large body habitus. Lung with elevation of the left hemidiaphragm. Stable left chest cardiac pacemaker. Stable volume loss in the left Stable cardiac size and mediastinal contours. No pneumothorax. No pleural effusion or consolidation. Chronic but increased increased interstitial markings/vascular congestion. No overt edema.  IMPRESSION: Acute on chronic increased interstitial markings could relate to pulmonary vascular congestion or viral/atypical respiratory infection.   Electronically Signed   By: LLars PinksM.D.   On: 01/04/2014 15:00    Review of Systems  Constitutional: Negative for fever and chills.  Gastrointestinal: Positive for abdominal pain and blood in stool. Negative for nausea, vomiting, diarrhea and constipation.   Blood pressure 149/54, pulse 77, temperature 97.6 F (36.4 C), temperature source Oral, resp. rate 18, height 5' (1.524 m), weight 205 lb 4 oz (93.1 kg), SpO2 100 %. Physical Exam  HENT:  Head: Normocephalic  and atraumatic.  Eyes: No scleral  icterus.  Cardiovascular: Normal rate, regular rhythm and normal heart sounds.   Respiratory: Effort normal and breath sounds normal. She has no wheezes.  GI: There is tenderness (mild thoughout). There is no rigidity, no rebound and no guarding. No hernia.      Assessment/Plan: Chronic abdominal pain  She has no urgent indication for operation. She should have other issues ironed out first and agree with continued evaluation for bleeding.  Her abdominal pain is likely multifactorial and may include the hernia.  I don't think I would recommend repair of this in her due to cormorbidities and don't know for sure this would help her pain issues.  I think best plan would be follow up as previously planned in our office once she is well.  Darean Rote 01/05/2014, 7:35 PM

## 2014-01-05 NOTE — Progress Notes (Signed)
CRITICAL VALUE ALERT  Critical value received:  Positive blood cultures, gram positive organisms in aerobic and anaerobic bottles  Date of notification:  12/915  Time of notification:  09:21  Critical value read back:Yes.    Nurse who received alert:  Virgilio Frees   MD notified (1st page):  Dr. Verlon Au

## 2014-01-05 NOTE — Progress Notes (Addendum)
Inpatient Diabetes Program Recommendations  AACE/ADA: New Consensus Statement on Inpatient Glycemic Control (2013)  Target Ranges:  Prepandial:   less than 140 mg/dL      Peak postprandial:   less than 180 mg/dL (1-2 hours)      Critically ill patients:  140 - 180 mg/dL   Results for [name], [name] (MRN [id number]) as of 01/05/2014 10:42  Ref. Range 01/04/2014 13:21 01/04/2014 20:25 01/05/2014 00:21 01/05/2014 04:00 01/05/2014 07:38  Glucose-Capillary Latest Range: 70-99 mg/dL 288 (H) 176 (H) 181 (H) 208 (H) 180 (H)   Diabetes history: DM2 Outpatient Diabetes medications: Metformin 500 mg BID, and              Humulin R U500 as follows (per Dr. Cruzita Lederer office note from 12/28/13): - 0.28 >> 0.30 mL (draw up to 30 units on the syringe) 30 min before breakfast - 0.22 >> 0.25 mL (draw up to 25 units on the syringe) 30 min before lunch - 0.20 >> 0.22 mL (draw up to 22 units on the syringe) 30 min before dinner Current orders for Inpatient glycemic control: CBGs Q4H but no insulin orders  Inpatient Diabetes Program Recommendations Correction (SSI): Please order Novolog correction scale Q4H.  01/05/14@13 :30-Spoke with patient about diabetes and home regimen for diabetes control. Patient reports that she is followed by Dr.Gherghe for diabetes management and currently she takes U500 and draws up to 30 unit mark on U100 syringe with breakfast, draws up to the 26 unit mark on U100 syringe with lunch, draws up to the 26 unit mark on U100 syringe with supper, and Metformin 500 mg BID as an outpatient for diabetes control.  According to the patient she has only been on U500 for about a month; prior to that she was taking Lantus and Humalog.  Inquired about knowledge about A1C and patient reports that she does not know her previous A1C. Discussed basic pathophysiology of DM Type 2, basic home care, importance of checking CBGs and maintaining good CBG control to prevent long-term and short-term complications.  Patient reports that her blood glucose has been running in the 300-400's mg/dl for a few months now. Patient reports that since starting the U500 her morning glucose is in the 200-300's mg/dl range and then her glucose ranges from 200-400's mg/dl during the remainder of the day. Patient reports that she rarely has a low glucose and states that she has not had any low glucose readings since she started on U500 insulin. Discussed A1C results (7.9% on 01/04/14) and unsure how A1C is in the 7% range given patients reported glycemic control with no reports of hypoglycemia.  Discussed impact of nutrition, exercise, stress, sickness, and medications on diabetes control.  Patient reports that she has been having issues with GI bleeding for over 3 years without an identified cause. She states that the pain has been worse lately and she has been upset and very emotional lately. During our conversation,patient became tearful and stated "I wish they could find out what is causing me to bleed so I will feel better and stop hurting". Requested patient complete PHQ-9 screening and patient scored 21.Provided emotional support and placed consult for Social Work and informed Caryl Pina, Therapist, sports regarding score. Patient verbalized understanding of information discussed and she states that she has no further questions at this time related to diabetes.   Thanks, Barnie Alderman, RN, MSN, CCRN, CDE Diabetes Coordinator Inpatient Diabetes Program [phone] (Team Pager) [phone] ([facility] office) [phone] [facility] office)

## 2014-01-06 ENCOUNTER — Encounter (HOSPITAL_COMMUNITY)
Admission: EM | Disposition: A | Discharge status: Home / Self Care | Payer: Self-pay | Source: Home / Self Care | Attending: Family Medicine

## 2014-01-06 ENCOUNTER — Inpatient Hospital Stay (HOSPITAL_COMMUNITY): Payer: Medicare Other

## 2014-01-06 ENCOUNTER — Encounter (HOSPITAL_COMMUNITY): Payer: Self-pay | Admitting: *Deleted

## 2014-01-06 ENCOUNTER — Inpatient Hospital Stay (HOSPITAL_COMMUNITY): Payer: Medicare Other | Admitting: Anesthesiology

## 2014-01-06 DIAGNOSIS — K5720 Diverticulitis of large intestine with perforation and abscess without bleeding: Secondary | ICD-10-CM

## 2014-01-06 DIAGNOSIS — K589 Irritable bowel syndrome without diarrhea: Secondary | ICD-10-CM

## 2014-01-06 DIAGNOSIS — K7460 Unspecified cirrhosis of liver: Secondary | ICD-10-CM

## 2014-01-06 DIAGNOSIS — E119 Type 2 diabetes mellitus without complications: Secondary | ICD-10-CM

## 2014-01-06 DIAGNOSIS — Z933 Colostomy status: Secondary | ICD-10-CM

## 2014-01-06 DIAGNOSIS — N390 Urinary tract infection, site not specified: Secondary | ICD-10-CM | POA: Insufficient documentation

## 2014-01-06 DIAGNOSIS — I509 Heart failure, unspecified: Secondary | ICD-10-CM

## 2014-01-06 DIAGNOSIS — B952 Enterococcus as the cause of diseases classified elsewhere: Secondary | ICD-10-CM

## 2014-01-06 DIAGNOSIS — T43625A Adverse effect of amphetamines, initial encounter: Secondary | ICD-10-CM

## 2014-01-06 DIAGNOSIS — R7881 Bacteremia: Secondary | ICD-10-CM

## 2014-01-06 HISTORY — PX: TEE WITHOUT CARDIOVERSION: SHX5443

## 2014-01-06 LAB — COMPREHENSIVE METABOLIC PANEL
ALT: 25 U/L (ref 0–35)
AST: 34 U/L (ref 0–37)
Albumin: 3 g/dL — ABNORMAL LOW (ref 3.5–5.2)
Alkaline Phosphatase: 111 U/L (ref 39–117)
Anion gap: 11 (ref 5–15)
BILIRUBIN TOTAL: 0.4 mg/dL (ref 0.3–1.2)
BUN: 13 mg/dL (ref 6–23)
CALCIUM: 10.8 mg/dL — AB (ref 8.4–10.5)
CHLORIDE: 96 meq/L (ref 96–112)
CO2: 29 mEq/L (ref 19–32)
Creatinine, Ser: 1.03 mg/dL (ref 0.50–1.10)
GFR calc Af Amer: 67 mL/min — ABNORMAL LOW (ref >90)
GFR calc non Af Amer: 57 mL/min — ABNORMAL LOW (ref >90)
Glucose, Bld: 237 mg/dL — ABNORMAL HIGH (ref 70–99)
Potassium: 3.8 mEq/L (ref 3.7–5.3)
SODIUM: 136 meq/L — AB (ref 137–147)
Total Protein: 6.5 g/dL (ref 6.0–8.3)

## 2014-01-06 LAB — CBC WITH DIFFERENTIAL/PLATELET
Basophils Absolute: 0.2 10*3/uL — ABNORMAL HIGH (ref 0.0–0.1)
Basophils Relative: 2 % — ABNORMAL HIGH (ref 0–1)
Eosinophils Absolute: 1.4 10*3/uL — ABNORMAL HIGH (ref 0.0–0.7)
Eosinophils Relative: 12 % — ABNORMAL HIGH (ref 0–5)
HCT: 31 % — ABNORMAL LOW (ref 36.0–46.0)
Hemoglobin: 9.9 g/dL — ABNORMAL LOW (ref 12.0–15.0)
LYMPHS PCT: 20 % (ref 12–46)
Lymphs Abs: 2.4 10*3/uL (ref 0.7–4.0)
MCH: 29.6 pg (ref 26.0–34.0)
MCHC: 31.9 g/dL (ref 30.0–36.0)
MCV: 92.5 fL (ref 78.0–100.0)
MONOS PCT: 20 % — AB (ref 3–12)
Monocytes Absolute: 2.4 10*3/uL — ABNORMAL HIGH (ref 0.1–1.0)
Neutro Abs: 5.4 10*3/uL (ref 1.7–7.7)
Neutrophils Relative %: 46 % (ref 43–77)
PLATELETS: 379 10*3/uL (ref 150–400)
RBC: 3.35 MIL/uL — AB (ref 3.87–5.11)
RDW: 26.4 % — ABNORMAL HIGH (ref 11.5–15.5)
WBC: 11.8 10*3/uL — AB (ref 4.0–10.5)

## 2014-01-06 LAB — GLUCOSE, CAPILLARY
GLUCOSE-CAPILLARY: 208 mg/dL — AB (ref 70–99)
GLUCOSE-CAPILLARY: 194 mg/dL — AB (ref 70–99)
GLUCOSE-CAPILLARY: 233 mg/dL — AB (ref 70–99)
Glucose-Capillary: 199 mg/dL — ABNORMAL HIGH (ref 70–99)
Glucose-Capillary: 229 mg/dL — ABNORMAL HIGH (ref 70–99)
Glucose-Capillary: 244 mg/dL — ABNORMAL HIGH (ref 70–99)
Glucose-Capillary: 251 mg/dL — ABNORMAL HIGH (ref 70–99)

## 2014-01-06 LAB — PROTIME-INR
INR: 1.11 (ref 0.00–1.49)
Prothrombin Time: 14.5 seconds (ref 11.6–15.2)

## 2014-01-06 LAB — HIV ANTIBODY (ROUTINE TESTING W REFLEX): HIV 1&2 Ab, 4th Generation: NONREACTIVE

## 2014-01-06 SURGERY — ECHOCARDIOGRAM, TRANSESOPHAGEAL
Anesthesia: Moderate Sedation

## 2014-01-06 SURGERY — ECHOCARDIOGRAM, TRANSESOPHAGEAL
Anesthesia: Monitor Anesthesia Care

## 2014-01-06 MED ORDER — INSULIN ASPART 100 UNIT/ML ~~LOC~~ SOLN
0.0000 [IU] | Freq: Three times a day (TID) | SUBCUTANEOUS | Status: DC
  Administered 2014-01-06: 2 [IU] via SUBCUTANEOUS
  Administered 2014-01-07: 3 [IU] via SUBCUTANEOUS
  Administered 2014-01-07 (×2): 2 [IU] via SUBCUTANEOUS
  Administered 2014-01-08: 7 [IU] via SUBCUTANEOUS
  Administered 2014-01-08: 2 [IU] via SUBCUTANEOUS
  Administered 2014-01-08: 5 [IU] via SUBCUTANEOUS

## 2014-01-06 MED ORDER — SODIUM CHLORIDE 0.9 % IV SOLN: INTRAVENOUS | Status: DC

## 2014-01-06 MED ORDER — INSULIN ASPART 100 UNIT/ML ~~LOC~~ SOLN
3.0000 [IU] | Freq: Three times a day (TID) | SUBCUTANEOUS | Status: DC
  Administered 2014-01-06 – 2014-01-10 (×12): 3 [IU] via SUBCUTANEOUS

## 2014-01-06 MED ORDER — MIDAZOLAM HCL 5 MG/5ML IJ SOLN
INTRAMUSCULAR | Status: DC | PRN
  Administered 2014-01-06 (×2): 1 mg via INTRAVENOUS

## 2014-01-06 MED ORDER — PROPOFOL 10 MG/ML IV BOLUS
INTRAVENOUS | Status: DC | PRN
  Administered 2014-01-06 (×2): 10 mg via INTRAVENOUS
  Administered 2014-01-06: 20 mg via INTRAVENOUS

## 2014-01-06 MED ORDER — LIDOCAINE HCL (CARDIAC) 20 MG/ML IV SOLN
INTRAVENOUS | Status: DC | PRN
  Administered 2014-01-06: 30 mg via INTRAVENOUS

## 2014-01-06 MED ORDER — LACTATED RINGERS IV SOLN
INTRAVENOUS | Status: DC | PRN
  Administered 2014-01-06: 11:00:00 via INTRAVENOUS

## 2014-01-06 NOTE — Plan of Care (Signed)
Problem: Phase I Progression Outcomes Goal: Pain controlled with appropriate interventions Outcome: Progressing     

## 2014-01-06 NOTE — Progress Notes (Signed)
  Echocardiogram Echocardiogram Transesophageal has been performed.  [name] 01/06/2014, 11:58 AM

## 2014-01-06 NOTE — Consult Note (Signed)
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  Stop flagyl/levaquin Check u/s of pacer pocket  Thank you so much for this interesting consult,   [name] (pager) [phone] [email or web address]  [name] is an 63 y.o. female.  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 She was also found to have hypercalcemia. On admission she was afebrile, she had WBC 14.3. She was started on levaquin and flagyl.  Her BCx from admission have grown GPC. UCx has grown enterococcus.  Had TEE that did not show vegetation.   Past Medical History  Diagnosis Date  . Cirrhosis   . GERD (gastroesophageal reflux disease)   . Cervical disc syndrome     trouble turnng neck at times  . Chronic respiratory failure   . Diverticulitis of colon   . Perforation of colon   . IBS (irritable bowel syndrome)   . Chronic lower GI bleeding   . Overactive bladder   . Thrombocytopenia     sees Dr. MJulien Nordmann  . Splenomegaly   . Depression   . Hypertension   . Asthma   . Heart murmur   . Hyperlipidemia   . COPD (chronic obstructive pulmonary disease)     sees Dr. CGwenette Greet  . Colostomy care   . Hypercalcemia   . CHF (congestive heart failure)   . Pacemaker 12/14/2013  . 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  Cervical cancer many years ago  . Complication of anesthesia     woke up during colonscopy and endoscopy in past  . History of blood transfusion "several"    "bleeding via ostomy" (01/04/2014)  . On home oxygen therapy     "2L; 24/7" (01/04/2014)  . Pneumonia "1-2 times"  . Chronic bronchitis "several times"  . Sleep apnea 2012    mild, no cpap needed  . Type II diabetes mellitus     sees Dr. GCruzita Lederer  . Chronic disease anemia     sees Dr. MJulien Nordmann due to chronic disease and GI losses   . History of stomach ulcers   . Migraine     "last one was several years ago" (01/04/2014)  . Arthritis     "tailbone; hands; legs" (01/04/2014)  . Anxiety   . Pericardial effusion 2007, 2015.   .Marland KitchenChronic abdominal pain     IBS    Past Surgical History  Procedure Laterality Date  . Colostomy  11/06/2005  . Lung removal, partial Left 2004    upper lobe removed  . Left colectomy  11/06/2005    Hartmann resection of sigmoid colon and end colostomy.  . Esophagogastroduodenoscopy  03/19/2011    Procedure: ESOPHAGOGASTRODUODENOSCOPY (EGD);  Surgeon: RInda Castle MD;  [facility];  Service: Endoscopy;  Laterality: N/A;  . Colonoscopy  03/19/2011    Procedure: COLONOSCOPY;  Surgeon: RHerbie Baltimore  Shaaron Adler, MD;  [facility];  Service: Endoscopy;  Laterality: N/A;  . Givens capsule study  03/20/2011    Procedure: GIVENS CAPSULE STUDY;  Surgeon: [name], MD;  Location: [facility];  Service: Endoscopy;  Laterality: N/A;  . Small bowel obstruction repair  March 2012  . Umbilical hernia repair  March 2012  . Splenectomy, total N/A 02/24/2013    Procedure: SPLENECTOMY;  Surgeon: Harl Bowie, MD;  Location: [facility];  Service: General;  Laterality: N/A;  . Orif patella Left 04/21/2013    Procedure: OPEN REDUCTION INTERNAL (ORIF) FIXATION PATELLA;  Surgeon: Augustin Schooling, MD;  Location: [facility];  Service: Orthopedics;  Laterality: Left;  . Pacemaker insertion  2015  .  Esophagogastroduodenoscopy (egd) with propofol N/A 11/02/2013    Procedure: ESOPHAGOGASTRODUODENOSCOPY (EGD) WITH PROPOFOL;  Surgeon: [name], MD;  Location: [facility];  Service: Endoscopy;  Laterality: N/A;  EGD with APC  . Hot hemostasis N/A 11/02/2013    Procedure: HOT HEMOSTASIS (ARGON PLASMA COAGULATION/BICAP);  Surgeon: [name], MD;  [facility];  Service: Endoscopy;  Laterality: N/A;  . Colon surgery    . Hernia repair    . Fracture surgery    . Abdominal hysterectomy    . Tubal ligation    . Tonsillectomy  1959  . Appendectomy  1962  . Cholecystectomy    . Cardiac catheterization  1967  . Permanent pacemaker insertion N/A 09/07/2013    Procedure: PERMANENT PACEMAKER INSERTION;  Surgeon: Evans Lance, MD;  Location: [facility] C[facility] LAB;  Service: Cardiovascular;  Laterality: N/A;     Allergies  Allergen Reactions  . Acetaminophen Other (See Comments)    Cirrhosis of liver  . Morphine Other (See Comments)    REACTION: Lowers BP  . Other Other (See Comments)    AGENT:  Per pt, cannot take blood thinners due to cirrhosis of the liver  . Penicillins Anaphylaxis and Rash  . Codeine Phosphate Other (See Comments)    REACTION: Stomach cramps  . Hydrocodone-Acetaminophen Other (See Comments)    REACTION: hallucinations  . Morphine And Related     Blood pressure drops   . Trazodone And Nefazodone     Cardiac arrythmia - DO NOT USE  . Cephalexin Swelling and Rash  . Hydrocodone-Acetaminophen Other (See Comments)    unknown    Medications:  Scheduled: . antiseptic oral rinse  7 mL Mouth Rinse BID  . atorvastatin  20 mg Oral q morning - 10a  . dicyclomine  10 mg Oral TID  . enoxaparin (LOVENOX) injection  40 mg Subcutaneous Q24H  . folic acid  1 mg Oral QPM  . furosemide  80 mg Oral BID  . hydrALAZINE  37.5 mg Oral BID  . insulin aspart  0-9 Units Subcutaneous TID WC  . insulin aspart  3 Units Subcutaneous TID WC  . iron polysaccharides  150 mg Oral  BID  . isosorbide dinitrate  20 mg Oral BID  . losartan  50 mg Oral q morning - 10a  . metoCLOPramide  10 mg Oral TID  . mometasone-formoterol  2 puff Inhalation BID  . pantoprazole  40 mg Oral Daily  . vancomycin  1,250 mg Intravenous Q12H    Abtx:  Anti-infectives    Start     Dose/Rate Route Frequency Ordered Stop   01/06/14 0500  vancomycin (VANCOCIN) IVPB 750 mg/150 ml premix  Status:  Discontinued     750 mg150 mL/hr over 60  Minutes Intravenous  Once 01/05/14 1623 01/06/14 1428   01/05/14 1645  vancomycin (VANCOCIN) 1,250 mg in sodium chloride 0.9 % 250 mL IVPB     1,250 mg166.7 mL/hr over 90 Minutes Intravenous Every 12 hours 01/05/14 1623     01/05/14 1600  levofloxacin (LEVAQUIN) IVPB 750 mg  Status:  Discontinued     750 mg100 mL/hr over 90 Minutes Intravenous Every 24 hours 01/04/14 1552 01/05/14 1514   01/05/14 0000  metroNIDAZOLE (FLAGYL) IVPB 500 mg  Status:  Discontinued     500 mg100 mL/hr over 60 Minutes Intravenous Every 8 hours 01/04/14 1552 01/05/14 1514   01/04/14 2200  rifaximin (XIFAXAN) tablet 550 mg  Status:  Discontinued     550 mg Oral 2 times daily 01/04/14 2027 01/05/14 1514   01/04/14 1530  levofloxacin (LEVAQUIN) IVPB 750 mg     750 mg100 mL/hr over 90 Minutes Intravenous  Once 01/04/14 1523 01/04/14 1758   01/04/14 1530  metroNIDAZOLE (FLAGYL) IVPB 500 mg     500 mg100 mL/hr over 60 Minutes Intravenous  Once 01/04/14 1523 01/04/14 1948      Total days of antibiotics 3 (vanco)          Social History:  reports that she quit smoking about 11 years ago. Her smoking use included Cigarettes. She has a 70 pack-year smoking history. She has never used smokeless tobacco. She reports that she does not drink alcohol or use illicit drugs.  Family History  Problem Relation Age of Onset  . Coronary artery disease    . Diabetes type II    . Anesthesia problems Neg Hx   . Hypotension Neg Hx   . Malignant hyperthermia Neg Hx   . Pseudochol deficiency Neg Hx     . Heart attack Mother   . Diabetes type II Mother   . Cirrhosis Father     General ROS: no dysuria, mild cloudiness, no heamturia, chronic back pain, +LE paresthiesias, + LE edema, see HPI.   Blood pressure 150/37, pulse 73, temperature 97.8 F (36.6 C), temperature source Oral, resp. rate 16, height 5' (1.524 m), weight 93.1 kg (205 lb 4 oz), SpO2 99 %. General appearance: alert, cooperative and no distress Eyes: negative findings: pupils equal, round, reactive to light and accomodation Throat: normal findings: oropharynx pink & moist without lesions or evidence of thrush Neck: no adenopathy and supple, symmetrical, trachea midline Lungs: clear to auscultation bilaterally Heart: regular rate and rhythm and pacer site is tender, nnon-fluctuant Abdomen: normal findings: bowel sounds normal and soft, non-tender Extremities: edema 2+ and no diabetic foot lesions. mild decrease in light touch   Results for orders placed or performed during the hospital encounter of 01/04/14 (from the past 48 hour(s))  I-Stat CG4 Lactic Acid, ED     Status: None   Collection Time: 01/04/14  4:43 PM  Result Value Ref Range   Lactic Acid, Venous 1.91 0.5 - 2.2 mmol/L  Glucose, capillary     Status: Abnormal   Collection Time: 01/04/14  8:25 PM  Result Value Ref Range   Glucose-Capillary 176 (H) 70 - 99 mg/dL  Parathyroid hormone, intact (no Ca)     Status: Abnormal   Collection Time: 01/04/14  9:02 PM  Result Value Ref Range   PTH 128 (H) 14 - 64 pg/mL    Comment: (NOTE) Interpretive Guide:  Intact PTH               Calcium                             ----------               ------- Normal Parathyroid           Normal                   Normal Hypoparathyroidism           Low or Low Normal        Low Hyperparathyroidism     Primary                 Normal or High           High     Secondary               High                     Normal or Low     Tertiary                 High                     High Non-Parathyroid  Hypercalcemia              Low or Low Normal        High **Please note change in methodology. If re-baselining is needed, for patients who are being serially tested, please call customer service, within 3 days of collection, to request to add on the appropriate re-baselining test code for the previous methodology, at no charge.** Performed at [facility]   Magnesium     Status: None   Collection Time: 01/04/14  9:02 PM  Result Value Ref Range   Magnesium 1.9 1.5 - 2.5 mg/dL  Phosphorus     Status: None   Collection Time: 01/04/14  9:02 PM  Result Value Ref Range   Phosphorus 2.7 2.3 - 4.6 mg/dL  TSH     Status: None   Collection Time: 01/04/14  9:02 PM  Result Value Ref Range   TSH 0.412 0.350 - 4.500 uIU/mL  Vit D  25 hydroxy (routine osteoporosis monitoring)     Status: Abnormal   Collection Time: 01/04/14  9:02 PM  Result Value Ref Range   Vit D, 25-Hydroxy 13 (L) 30 - 100 ng/mL    Comment: (NOTE) ** Please note change in reference range(s). ** Vitamin D Status           25-OH Vitamin D       Deficiency                <20 ng/mL       Insufficiency         20 - 29 ng/mL       Optimal             > or = 30 ng/mL For 25-OH Vitamin D testing on patients on D2-supplementation and patients for whom quantitation of D2 and D3 fractions is required, the QuestAssureD 25-OH VIT D, (D2,D3), LC/MS/MS is recommended: order code [id number] (patients > 2 yrs). Performed at [facility]   Sedimentation rate     Status: Abnormal   Collection Time: 01/04/14  9:02 PM  Result Value Ref  Range   Sed Rate 40 (H) 0 - 22 mm/hr  APTT     Status: None   Collection Time: 01/04/14  9:02 PM  Result Value Ref Range   aPTT 29 24 - 37 seconds  Protime-INR     Status: None   Collection Time: 01/04/14  9:02 PM  Result Value Ref Range   Prothrombin Time 14.1 11.6 - 15.2 seconds   INR 1.07 0.00 - 1.49  Hemoglobin A1c     Status: Abnormal    Collection Time: 01/04/14  9:02 PM  Result Value Ref Range   Hgb A1c MFr Bld 7.9 (H) <5.7 %    Comment: (NOTE)                                                                       According to the ADA Clinical Practice Recommendations for 2011, when HbA1c is used as a screening test:  >=6.5%   Diagnostic of Diabetes Mellitus           (if abnormal result is confirmed) 5.7-6.4%   Increased risk of developing Diabetes Mellitus References:Diagnosis and Classification of Diabetes Mellitus,Diabetes HALP,3790,24(OXBDZ 1):S62-S69 and Standards of Medical Care in         Diabetes - 2011,Diabetes Care,2011,34 (Suppl 1):S11-S61.    Mean Plasma Glucose 180 (H) <117 mg/dL    Comment: Performed at [facility]  Glucose, capillary     Status: Abnormal   Collection Time: 01/05/14 12:21 AM  Result Value Ref Range   Glucose-Capillary 181 (H) 70 - 99 mg/dL  Glucose, capillary     Status: Abnormal   Collection Time: 01/05/14  4:00 AM  Result Value Ref Range   Glucose-Capillary 208 (H) 70 - 99 mg/dL  CBC with Differential     Status: Abnormal   Collection Time: 01/05/14  5:25 AM  Result Value Ref Range   WBC 12.1 (H) 4.0 - 10.5 K/uL    Comment: WHITE COUNT CONFIRMED ON SMEAR REPEATED TO VERIFY    RBC 3.11 (L) 3.87 - 5.11 MIL/uL   Hemoglobin 9.2 (L) 12.0 - 15.0 g/dL   HCT 29.2 (L) 36.0 - 46.0 %   MCV 93.9 78.0 - 100.0 fL   MCH 29.6 26.0 - 34.0 pg   MCHC 31.5 30.0 - 36.0 g/dL   RDW 26.6 (H) 11.5 - 15.5 %   Platelets 378 150 - 400 K/uL    Comment: PLATELET COUNT CONFIRMED BY SMEAR REPEATED TO VERIFY    Neutrophils Relative % 47 43 - 77 %   Lymphocytes Relative 21 12 - 46 %   Monocytes Relative 19 (H) 3 - 12 %   Eosinophils Relative 11 (H) 0 - 5 %   Basophils Relative 2 (H) 0 - 1 %   Neutro Abs 5.8 1.7 - 7.7 K/uL   Lymphs Abs 2.5 0.7 - 4.0 K/uL   Monocytes Absolute 2.3 (H) 0.1 - 1.0 K/uL   Eosinophils Absolute 1.3 (H) 0.0 - 0.7 K/uL   Basophils Absolute 0.2 (H) 0.0 - 0.1 K/uL    RBC Morphology HOWELL/JOLLY BODIES     Comment: PAPPENHEIMER BODIES   WBC Morphology ATYPICAL LYMPHOCYTES   Comprehensive metabolic panel     Status: Abnormal   Collection Time:  01/05/14  5:25 AM  Result Value Ref Range   Sodium 143 137 - 147 mEq/L   Potassium 4.5 3.7 - 5.3 mEq/L   Chloride 106 96 - 112 mEq/L    Comment: DELTA CHECK NOTED   CO2 25 19 - 32 mEq/L   Glucose, Bld 233 (H) 70 - 99 mg/dL   BUN 15 6 - 23 mg/dL   Creatinine, Ser 1.09 0.50 - 1.10 mg/dL   Calcium 10.5 8.4 - 10.5 mg/dL   Total Protein 6.2 6.0 - 8.3 g/dL   Albumin 2.7 (L) 3.5 - 5.2 g/dL   AST 35 0 - 37 U/L   ALT 28 0 - 35 U/L   Alkaline Phosphatase 107 39 - 117 U/L   Total Bilirubin 0.3 0.3 - 1.2 mg/dL   GFR calc non Af Amer 54 (L) >90 mL/min   GFR calc Af Amer 62 (L) >90 mL/min    Comment: (NOTE) The eGFR has been calculated using the CKD EPI equation. This calculation has not been validated in all clinical situations. eGFR's persistently <90 mL/min signify possible Chronic Kidney Disease.    Anion gap 12 5 - 15  Glucose, capillary     Status: Abnormal   Collection Time: 01/05/14  7:38 AM  Result Value Ref Range   Glucose-Capillary 180 (H) 70 - 99 mg/dL  Glucose, capillary     Status: Abnormal   Collection Time: 01/05/14 11:53 AM  Result Value Ref Range   Glucose-Capillary 205 (H) 70 - 99 mg/dL  Glucose, capillary     Status: Abnormal   Collection Time: 01/05/14  6:08 PM  Result Value Ref Range   Glucose-Capillary 262 (H) 70 - 99 mg/dL  Glucose, capillary     Status: Abnormal   Collection Time: 01/05/14  8:38 PM  Result Value Ref Range   Glucose-Capillary 297 (H) 70 - 99 mg/dL  Glucose, capillary     Status: Abnormal   Collection Time: 01/06/14 12:19 AM  Result Value Ref Range   Glucose-Capillary 251 (H) 70 - 99 mg/dL  CBC with Differential     Status: Abnormal   Collection Time: 01/06/14  3:54 AM  Result Value Ref Range   WBC 11.8 (H) 4.0 - 10.5 K/uL   RBC 3.35 (L) 3.87 - 5.11 MIL/uL    Hemoglobin 9.9 (L) 12.0 - 15.0 g/dL   HCT 31.0 (L) 36.0 - 46.0 %   MCV 92.5 78.0 - 100.0 fL   MCH 29.6 26.0 - 34.0 pg   MCHC 31.9 30.0 - 36.0 g/dL   RDW 26.4 (H) 11.5 - 15.5 %   Platelets 379 150 - 400 K/uL    Comment: PLATELET COUNT CONFIRMED BY SMEAR LARGE PLATELETS PRESENT    Neutrophils Relative % 46 43 - 77 %   Lymphocytes Relative 20 12 - 46 %   Monocytes Relative 20 (H) 3 - 12 %   Eosinophils Relative 12 (H) 0 - 5 %   Basophils Relative 2 (H) 0 - 1 %   Neutro Abs 5.4 1.7 - 7.7 K/uL   Lymphs Abs 2.4 0.7 - 4.0 K/uL   Monocytes Absolute 2.4 (H) 0.1 - 1.0 K/uL   Eosinophils Absolute 1.4 (H) 0.0 - 0.7 K/uL   Basophils Absolute 0.2 (H) 0.0 - 0.1 K/uL   RBC Morphology HOWELL/JOLLY BODIES     Comment: POLYCHROMASIA PRESENT  Comprehensive metabolic panel     Status: Abnormal   Collection Time: 01/06/14  3:54 AM  Result Value Ref Range  Sodium 136 (L) 137 - 147 mEq/L    Comment: DELTA CHECK NOTED   Potassium 3.8 3.7 - 5.3 mEq/L   Chloride 96 96 - 112 mEq/L    Comment: DELTA CHECK NOTED   CO2 29 19 - 32 mEq/L   Glucose, Bld 237 (H) 70 - 99 mg/dL   BUN 13 6 - 23 mg/dL   Creatinine, Ser 1.03 0.50 - 1.10 mg/dL   Calcium 10.8 (H) 8.4 - 10.5 mg/dL   Total Protein 6.5 6.0 - 8.3 g/dL   Albumin 3.0 (L) 3.5 - 5.2 g/dL   AST 34 0 - 37 U/L   ALT 25 0 - 35 U/L   Alkaline Phosphatase 111 39 - 117 U/L   Total Bilirubin 0.4 0.3 - 1.2 mg/dL   GFR calc non Af Amer 57 (L) >90 mL/min   GFR calc Af Amer 67 (L) >90 mL/min    Comment: (NOTE) The eGFR has been calculated using the CKD EPI equation. This calculation has not been validated in all clinical situations. eGFR's persistently <90 mL/min signify possible Chronic Kidney Disease.    Anion gap 11 5 - 15  Protime-INR     Status: None   Collection Time: 01/06/14  3:54 AM  Result Value Ref Range   Prothrombin Time 14.5 11.6 - 15.2 seconds   INR 1.11 0.00 - 1.49  Glucose, capillary     Status: Abnormal   Collection Time: 01/06/14  4:59  AM  Result Value Ref Range   Glucose-Capillary 229 (H) 70 - 99 mg/dL  Glucose, capillary     Status: Abnormal   Collection Time: 01/06/14  7:29 AM  Result Value Ref Range   Glucose-Capillary 244 (H) 70 - 99 mg/dL  Glucose, capillary     Status: Abnormal   Collection Time: 01/06/14 12:17 PM  Result Value Ref Range   Glucose-Capillary 208 (H) 70 - 99 mg/dL      Component Value Date/Time   SDES BLOOD LEFT ANTECUBITAL 01/04/2014 1415   SPECREQUEST BOTTLES DRAWN AEROBIC AND ANAEROBIC 5CC 01/04/2014 1415   CULT  01/04/2014 1415    GRAM POSITIVE COCCI IN CHAINS Note: Gram Stain Report Called to,Read Back By and Verified With: Virgilio Frees @ [place] [id number] BY [facility] Performed at [place] PENDING 01/04/2014 1415   Ct Abdomen Pelvis W Contrast  01/04/2014   CLINICAL DATA:  Abdominal pain.  Colostomy.  Fever.  Cirrhosis.  EXAM: CT ABDOMEN AND PELVIS WITH CONTRAST  TECHNIQUE: Multidetector CT imaging of the abdomen and pelvis was performed using the standard protocol following bolus administration of intravenous contrast.  CONTRAST:  177m OMNIPAQUE IOHEXOL 300 MG/ML  SOLN  COMPARISON:  CT abdomen pelvis 11/29/2013  FINDINGS: Lung bases are clear aside from mild scarring. Cardiac enlargement. Pacemaker noted.  Cirrhotic liver with nodular contour of the capsule. The left lobe liver is enlarged and the liver is enlarged overall. Gallbladder is surgically absent. Spleen has been removed. Small regenerating splenules are stable. No varices are noted.  The kidneys show no obstruction or mass or stone. Pancreas is negative for mass or edema. Common bile duct nondilated  Left colostomy. Large peristomal hernia containing bowel loops. No bowel obstruction.  No free fluid. No mass or adenopathy. No significant spinal abnormality.  IMPRESSION: Cirrhotic liver without ascites.  Large peristomal hernia on the left without bowel obstruction.   Electronically Signed   By: CFranchot GalloM.D.    On: 01/04/2014 16:39  Recent Results (from the past 240 hour(s))  Blood Culture (routine x 2)     Status: None (Preliminary result)   Collection Time: 01/04/14  2:05 PM  Result Value Ref Range Status   Specimen Description BLOOD RIGHT ANTECUBITAL  Final   Special Requests BOTTLES DRAWN AEROBIC AND ANAEROBIC 5CC  Final   Culture  Setup Time   Final    01/04/2014 17:59 Performed at [facility]    Culture   Final    [place] IN CHAINS Note: Gram Stain Report Called to,Read Back By and Verified With: CESE BOKIAGON 01/05/14 [id number] Malcolm Performed at [facility]    Report Status PENDING  Incomplete  Urine culture     Status: None (Preliminary result)   Collection Time: 01/04/14  2:10 PM  Result Value Ref Range Status   Specimen Description URINE, RANDOM  Final   Special Requests NONE  Final   Culture  Setup Time   Final    01/04/2014 21:44 Performed at [place]   Final    >=100,000 COLONIES/ML Performed at [facility]    Culture   Final    ENTEROCOCCUS SPECIES Performed at [facility]    Report Status PENDING  Incomplete  Blood Culture (routine x 2)     Status: None (Preliminary result)   Collection Time: 01/04/14  2:15 PM  Result Value Ref Range Status   Specimen Description BLOOD LEFT ANTECUBITAL  Final   Special Requests BOTTLES DRAWN AEROBIC AND ANAEROBIC 5CC  Final   Culture  Setup Time   Final    01/04/2014 17:59 Performed at [facility]    Culture   Final    [place] CHAINS Note: Gram Stain Report Called to,Read Back By and Verified With: Virgilio Frees @ 54 [place] [id number] BY [facility] Performed at [facility]    Report Status PENDING  Incomplete      01/06/2014, 2:57 PM     LOS: 2 days

## 2014-01-06 NOTE — Plan of Care (Signed)
Problem: Phase I Progression Outcomes Goal: OOB as tolerated unless otherwise ordered Outcome: Progressing     

## 2014-01-06 NOTE — Consult Note (Signed)
Reason for Consult:Consideration for PM lead extraction  Referring Physician: Dr. Dionne Bucy is an 63 y.o. female.   HPI: The patient is a 63 year old woman with a history of symptomatic sinus node dysfunction, status post permanent pacemaker insertion several months ago, who was admitted to the hospital after atwo to three-week episode of fevers, abdominal pain, and blood in her ostomy. Subsequent blood cultures have demonstrated gram-positive cocci and she is referred for consideration for pacemaker system removal. The patient has improved very nicely on intravenous antibiotic therapy. She notes some persistent abdominal pain. Her past medical history is fairly extensive and noted below.  PMH: Past Medical History  Diagnosis Date  . Cirrhosis   . GERD (gastroesophageal reflux disease)   . Cervical disc syndrome     trouble turnng neck at times  . Chronic respiratory failure   . Diverticulitis of colon   . Perforation of colon   . IBS (irritable bowel syndrome)   . Chronic lower GI bleeding   . Overactive bladder   . Thrombocytopenia     sees Dr. Julien Nordmann   . Splenomegaly   . Depression   . Hypertension   . Asthma   . Heart murmur   . Hyperlipidemia   . COPD (chronic obstructive pulmonary disease)     sees Dr. Gwenette Greet   . Colostomy care   . Hypercalcemia   . CHF (congestive heart failure)   . Pacemaker 12/14/2013  . 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 Cervical cancer many years ago  . Complication of anesthesia     woke up during colonscopy and endoscopy in past  . History of blood transfusion "several"    "bleeding via ostomy" (01/04/2014)  . On home oxygen therapy     "2L; 24/7" (01/04/2014)  . Pneumonia "1-2 times"  . Chronic bronchitis "several times"  . Sleep apnea 2012    mild, no cpap needed  . Type II diabetes mellitus     sees Dr. Cruzita Lederer   . Chronic disease anemia     sees Dr. Julien Nordmann, due to chronic disease  and GI losses   . History of stomach ulcers   . Migraine     "last one was several years ago" (01/04/2014)  . Arthritis     "tailbone; hands; legs" (01/04/2014)  . Anxiety   . Pericardial effusion 2007, 2015.   [name] Chronic abdominal pain     IBS    PSHX: Past Surgical History  Procedure Laterality Date  . Colostomy  11/06/2005  . Lung removal, partial Left 2004    upper lobe removed  . Left colectomy  11/06/2005    Hartmann resection of sigmoid colon and end colostomy.  . Esophagogastroduodenoscopy  03/19/2011    Procedure: ESOPHAGOGASTRODUODENOSCOPY (EGD);  Surgeon: [name], MD;  Location: [facility];  Service: Endoscopy;  Laterality: N/A;  . Colonoscopy  03/19/2011    Procedure: COLONOSCOPY;  Surgeon: [name], MD;  Location: [facility];  Service: Endoscopy;  Laterality: N/A;  . Givens capsule study  03/20/2011    Procedure: GIVENS CAPSULE STUDY;  Surgeon: [name], MD;  Location: [facility];  Service: Endoscopy;  Laterality: N/A;  . Small bowel obstruction repair  March 2012  . Umbilical hernia repair  March 2012  . Splenectomy, total N/A 02/24/2013    Procedure: SPLENECTOMY;  Surgeon: Harl Bowie, MD;  Location: [facility];  Service: General;  Laterality: N/A;  . Orif patella Left 04/21/2013    Procedure: OPEN REDUCTION INTERNAL (ORIF) FIXATION PATELLA;  Surgeon: Augustin Schooling, MD;  Location: [facility];  Service: Orthopedics;  Laterality: Left;  . Pacemaker insertion  2015  . Esophagogastroduodenoscopy (egd) with propofol N/A 11/02/2013    Procedure: ESOPHAGOGASTRODUODENOSCOPY (EGD) WITH PROPOFOL;  Surgeon: [name], MD;  Location: [facility];  Service: Endoscopy;  Laterality: N/A;  EGD with APC  . Hot hemostasis N/A 11/02/2013    Procedure: HOT HEMOSTASIS (ARGON PLASMA COAGULATION/BICAP);  Surgeon: [name], MD;  Location: [facility];  Service: Endoscopy;  Laterality: N/A;  . Colon surgery    . Hernia repair    . Fracture surgery    .  Abdominal hysterectomy    . Tubal ligation    . Tonsillectomy  1959  . Appendectomy  1962  . Cholecystectomy    . Cardiac catheterization  1967    FAMHX: Family History  Problem Relation Age of Onset  . Coronary artery disease    . Diabetes type II    . Anesthesia problems Neg Hx   . Hypotension Neg Hx   . Malignant hyperthermia Neg Hx   . Pseudochol deficiency Neg Hx   . Heart attack Mother   . Diabetes type II Mother   . Cirrhosis Father     Social History:  reports that she quit smoking about 11 years ago. Her smoking use included Cigarettes. She has a 70 pack-year smoking history. She has never used smokeless tobacco. She reports that she does not drink alcohol or use illicit drugs.  Allergies:  Allergies  Allergen Reactions  . Acetaminophen Other (See Comments)    Cirrhosis of liver  . Morphine Other (See Comments)    REACTION: Lowers BP  . Other Other (See Comments)    AGENT:  Per pt, cannot take blood thinners due to cirrhosis of the liver  . Penicillins Anaphylaxis and Rash  . Codeine Phosphate Other (See Comments)    REACTION: Stomach cramps  . Hydrocodone-Acetaminophen Other (See Comments)    REACTION: hallucinations  . Morphine And Related     Blood pressure drops   . Trazodone And Nefazodone     Cardiac arrythmia - DO NOT USE  . Cephalexin Swelling and Rash  . Hydrocodone-Acetaminophen Other (See Comments)    unknown    Medications: Reviewed  Ct Abdomen Pelvis W Contrast  01/04/2014   CLINICAL DATA:  Abdominal pain.  Colostomy.  Fever.  Cirrhosis.  EXAM: CT ABDOMEN AND PELVIS WITH CONTRAST  TECHNIQUE: Multidetector CT imaging of the abdomen and pelvis was performed using the standard protocol following bolus administration of intravenous contrast.  CONTRAST:  167mL OMNIPAQUE IOHEXOL 300 MG/ML  SOLN  COMPARISON:  CT abdomen pelvis 11/29/2013  FINDINGS: Lung bases are clear aside from mild scarring. Cardiac enlargement. Pacemaker noted.  Cirrhotic liver  with nodular contour of the capsule. The left lobe liver is enlarged and the liver is enlarged overall. Gallbladder is surgically absent. Spleen has been removed. Small regenerating splenules are stable. No varices are noted.  The kidneys show no obstruction or mass or stone. Pancreas is negative for mass or edema. Common bile duct nondilated  Left colostomy. Large peristomal hernia containing bowel loops. No bowel obstruction.  No free fluid. No mass or adenopathy. No significant spinal abnormality.  IMPRESSION: Cirrhotic liver without ascites.  Large peristomal hernia on the left without bowel obstruction.   Electronically Signed   By: Franchot Gallo  M.D.   On: 01/04/2014 16:39   Dg Chest Port 1 View  01/04/2014   CLINICAL DATA:  63 year old female with upper abdominal pain, nausea, weakness, fever. Initial encounter. History of partial left pneumonectomy due to lung cancer.  EXAM: PORTABLE CHEST - 1 VIEW  COMPARISON:  Chest radiographs 11/27/2013 and earlier.  FINDINGS: Portable AP semi upright view at 1434 hrs. Large body habitus. Lung with elevation of the left hemidiaphragm. Stable left chest cardiac pacemaker. Stable volume loss in the left Stable cardiac size and mediastinal contours. No pneumothorax. No pleural effusion or consolidation. Chronic but increased increased interstitial markings/vascular congestion. No overt edema.  IMPRESSION: Acute on chronic increased interstitial markings could relate to pulmonary vascular congestion or viral/atypical respiratory infection.   Electronically Signed   By: Lars Pinks M.D.   On: 01/04/2014 15:00    ROS  As stated in the HPI and negative for all other systems.  Physical Exam  Vitals:Blood pressure 127/53, pulse 89, temperature 98.5 F (36.9 C), temperature source Oral, resp. rate 18, height 5' (1.524 m), weight 205 lb 4 oz (93.1 kg), SpO2 98 %.  Chronically ill appearing NAD HEENT: Unremarkable Neck:  No JVD, no thyromegally Lymphatics:  No  adenopathy Back:  No CVA tenderness Lungs:  Clear with no wheezes, rales, or rhonchi. HEART:  Regular rate rhythm, no murmurs, no rubs, no clicks Abd:  soft, positive bowel sounds, no organomegally, no rebound, no guarding, there is some tenderness to deep palpation, right greater than left Ext:  2 plus pulses, no edema, no cyanosis, no clubbing Skin:  No rashes no nodules Neuro:  CN II through XII intact, motor grossly intact  Assessment/Plan: 1. Gram-positive cocci bacteremia 2. Indwelling pacemaker secondary to symptomatic sinus node dysfunction 3. History of multiple GI procedures in the past with ostomy tube in place 4. Status post remote splenectomy 5. History of thrombocytopenia Discussion: The patient has been ill for several weeks, and I am concerned about subacute bacterial endocarditis. She has blood cultures growing gram-positive cocci. Final identification is pending. She has an indwelling permanent pacemaker which may well require extraction. It would be mandatory if extraction is carried out to understand the size of any vegetations on her lead. Depending on the type of organism, we might consider a long-term course of antibiotic therapy if there is no endocarditis on her pacemaker leads, and she continues to defervesce. I would appreciate infectious disease consultation for additional advice on the subject of mandatory extraction in the case of Gram-positive cocci bacteremia.  Carleene Overlie TaylorMD 01/06/2014, 8:43 AM

## 2014-01-06 NOTE — Transfer of Care (Signed)
Immediate Anesthesia Transfer of Care Note  Patient: [name]  Procedure(s) Performed: Procedure(s): TRANSESOPHAGEAL ECHOCARDIOGRAM (TEE) (N/A)  Patient Location: PACU and Endoscopy Unit  Anesthesia Type:MAC  Level of Consciousness: awake  Airway & Oxygen Therapy: Patient Spontanous Breathing and Patient connected to nasal cannula oxygen  Post-op Assessment: Report given to PACU RN and Post -op Vital signs reviewed and stable  Post vital signs: Reviewed and stable  Complications: No apparent anesthesia complications

## 2014-01-06 NOTE — Progress Notes (Signed)
[name] JQZ:[id number] DOB: [birth year] DOA: 01/04/2014 PCP: [name], MD  Brief narrative: 63 y/o ? 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 She was found to have gram positive cocci in chains on 2/2 blood cultures Cardiology consulted, TEE neg ID consult appreciated in advance  history-As per Problem list Chart reviewed as below  Consultants:  Reviewed   Procedures: Gastroenterology  Cardiology Gen surgery  Antibiotics:  Rifaximin 12/8-12/9  Flagyl 12/8 -12/9  Levaquin 12/8 -12/9  Vancomycin 12/9--   Subjective   States 9/10 pain but lying comfy in bed Relieved about no findings on TEE Eating and drinking No fever nor chills   Objective    Interim History:Reviewed  Telemetry:    Objective: Filed Vitals:   01/06/14 1026 01/06/14 1028 01/06/14 1200 01/06/14 1205  BP: 154/50  144/42 150/37  Pulse: 78  70 73  Temp:  97.8 F (36.6 C) 97.8 F (36.6 C)   TempSrc: Oral Oral Oral   Resp: 13   16  Height:      Weight:      SpO2: 100%  98% 99%    Intake/Output Summary (Last 24 hours) at 01/06/14 1408 Last data filed at 01/06/14 1157  Gross per 24 hour  Intake    930 ml  Output   3500 ml  Net  -2570 ml    Exam:  General: Alert wasn't oriented no apparent distress Cardiovascular: S1-S2 no murmur rub or gallop Respiratory: Clinically clear Abdomen: Soft nontender ostomy intact but full of soft stool Skin no lower extremity edema Neuro intact  Data Reviewed: Basic Metabolic Panel:  Recent Labs Lab 01/04/14 1405 01/04/14 2102 01/05/14 0525 01/06/14 0354  NA 137  --   143 136*  K 3.9  --  4.5 3.8  CL 96  --  106 96  CO2 26  --  25 29  GLUCOSE 285*  --  233* 237*  BUN 18  --  15 13  CREATININE 1.17*  --  1.09 1.03  CALCIUM 11.5*  --  10.5 10.8*  MG  --  1.9  --   --   PHOS  --  2.7  --   --    Liver Function Tests:  Recent Labs Lab 01/04/14 1405 01/05/14 0525 01/06/14 0354  AST 45* 35 34  ALT 35 28 25  ALKPHOS 124* 107 111  BILITOT 0.4 0.3 0.4  PROT 6.9 6.2 6.5  ALBUMIN 3.3* 2.7* 3.0*   No results for input(s): LIPASE, AMYLASE in the last 168 hours. No results for input(s): AMMONIA in the last 168 hours. CBC:  Recent Labs Lab 01/04/14 1405 01/05/14 0525 01/06/14 0354  WBC 14.3* 12.1* 11.8*  NEUTROABS 7.9* 5.8 5.4  HGB 9.7* 9.2* 9.9*  HCT 30.8* 29.2* 31.0*  MCV 90.3 93.9 92.5  PLT 387 378 379   Cardiac Enzymes: No results for input(s): CKTOTAL, CKMB, CKMBINDEX, TROPONINI in the last 168 hours. BNP: Invalid input(s): POCBNP CBG:  Recent Labs Lab 01/05/14 2038 01/06/14 0019 01/06/14 0459 01/06/14 0729 01/06/14 1217  GLUCAP 297* 251* 229* 244* 208*    Recent Results (from the past 240 hour(s))  Blood  Culture (routine x 2)     Status: None (Preliminary result)   Collection Time: 01/04/14  2:05 PM  Result Value Ref Range Status   Specimen Description BLOOD RIGHT ANTECUBITAL  Final   Special Requests BOTTLES DRAWN AEROBIC AND ANAEROBIC 5CC  Final   Culture  Setup Time   Final    01/04/2014 17:59 Performed at [facility]    Culture   Final    GRAM POSITIVE COCCI IN CHAINS Note: Gram Stain Report Called to,Read Back By and Verified With: CESE BOKIAGON 01/05/14 0657A Gray Performed at [facility]    Report Status PENDING  Incomplete  Urine culture     Status: None (Preliminary result)   Collection Time: 01/04/14  2:10 PM  Result Value Ref Range Status   Specimen Description URINE, RANDOM  Final   Special Requests NONE  Final   Culture  Setup Time   Final    01/04/2014 21:44 Performed at [facility]   Final    >=100,000 COLONIES/ML Performed at [facility]    Culture   Final    ENTEROCOCCUS SPECIES Performed at [facility]    Report Status PENDING  Incomplete  Blood Culture (routine x 2)     Status: None (Preliminary result)   Collection Time: 01/04/14  2:15 PM  Result Value Ref Range Status   Specimen Description BLOOD LEFT ANTECUBITAL  Final   Special Requests BOTTLES DRAWN AEROBIC AND ANAEROBIC 5CC  Final   Culture  Setup Time   Final    01/04/2014 17:59 Performed at [facility]    Culture   Final    [place] IN CHAINS Note: Gram Stain Report Called to,Read Back By and Verified With: Virgilio Frees @ 21 ON [id number] BY [facility] Performed at [facility]    Report Status PENDING  Incomplete     Studies:              All Imaging reviewed and is as per above notation   Scheduled Meds: . antiseptic oral rinse  7 mL Mouth Rinse BID  . atorvastatin  20 mg Oral q morning - 10a  . dicyclomine  10 mg Oral TID  . enoxaparin (LOVENOX) injection  40 mg Subcutaneous Q24H  . folic acid  1 mg Oral QPM  . furosemide  80 mg Oral BID  . hydrALAZINE  37.5 mg Oral BID  . iron polysaccharides  150 mg Oral BID  . isosorbide dinitrate  20 mg Oral BID  . losartan  50 mg Oral q morning - 10a  . metoCLOPramide  10 mg Oral TID  . mometasone-formoterol  2 puff Inhalation BID  . pantoprazole  40 mg Oral Daily  . vancomycin  1,250 mg Intravenous Q12H  . vancomycin  750 mg Intravenous Once   Continuous Infusions:    Assessment/Plan:  1. Possible subacute upper GI bleed secondary to gastropathy-Last EGD 03/2012 confirmed presence of gastropathy however EGD 11/02/13 was negative.  GI has signed off, Gen surg wishes to pursue stomal revision as OP.  Hb stable.  No further bleeding.  OP management.  I will forward to Dr. [name] her Reg GI MD  As FYI 2. Gram-positive bacteremia- antibiotics Flagyl, Levaquin,  rifaximin-Vancomycin under guidance Dr. Megan Salon ID who i telephone consulted.  Unclear source-urine is dirty but I do not suspect this as this was a "clean catch" specimen.  she does have however a  pacemaker-TEE done-Cardiology consulted-No overt Veggies on Valve.  Defer to Dr. Johnnye Sima ID re: formal input--she has poor venous access so might need PICC 3. 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 For now patient to continue other medications  4. 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 No evidences of anasarca.  Rifaximin mean is a second line therapy and ideally the patient should be on lactulose prior to transition to this.  Strict ins and outs 5. Mild COPD-continue oxygen therapy, dulera 2 puffs twice a day , DuoNeb 3 mils every 4 when necessary, 6. Hypertension-continue losartan 50 mg every morning, hydralazine 37.5 twice a day, Lasix 80 twice a day, Imdur 20 twice a day.moderate controlled -might need increase in medications 7.  anxiety-continue diazepam 5 mg 3 times a day when necessary-will need CBT As Outpatient per primary physician  8.  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 History of iron deficiency anemia-monitor labs.    Code Status: full Family Communication:  None present Disposition Plan: inpatient   Verneita Griffes, MD  [facility] Pager [phone] 01/06/2014, 2:08 PM    LOS: 2 days

## 2014-01-06 NOTE — Progress Notes (Signed)
[facility] Progress Note     Subjective: Pt emotional.  C/o chronic pain in abdomen especially around her ostomy.  No N/V, tolerating clears, but NPO for possible TEE today.  They thinks she has endocarditis and may need her pacemaker out.  Objective: Vital signs in last 24 hours: Temp:  [97.6 F (36.4 C)-98.6 F (37 C)] 98.5 F (36.9 C) (12/10 0523) Pulse Rate:  [75-89] 89 (12/10 0523) Resp:  [17-18] 18 (12/10 0523) BP: (127-149)/(53-60) 127/53 mmHg (12/10 0523) SpO2:  [96 %-100 %] 98 % (12/10 0739) Last BM Date: 01/05/14  Intake/Output from previous day: 12/09 0701 - 12/10 0700 In: 970 [P.O.:720; IV Piggyback:250] Out: 5000 [Urine:5000] Intake/Output this shift:    PE: Gen:  Alert, NAD, pleasant Abd: Soft, ND, mild tenderness of entire abdomen >around ostomy.  Large parastomal hernia.  Ostomy is patent and pink and flatus in bag.  Bag just changed and no stool yet.  No blood in bag.  +BS, no HSM, abdominal scars noted   Lab Results:   Recent Labs  01/05/14 0525 01/06/14 0354  WBC 12.1* 11.8*  HGB 9.2* 9.9*  HCT 29.2* 31.0*  PLT 378 379   BMET  Recent Labs  01/05/14 0525 01/06/14 0354  NA 143 136*  K 4.5 3.8  CL 106 96  CO2 25 29  GLUCOSE 233* 237*  BUN 15 13  CREATININE 1.09 1.03  CALCIUM 10.5 10.8*   PT/INR  Recent Labs  01/04/14 2102 01/06/14 0354  LABPROT 14.1 14.5  INR 1.07 1.11   CMP     Component Value Date/Time   NA 136* 01/06/2014 0354   NA 138 06/23/2013 0806   K 3.8 01/06/2014 0354   K 4.7 06/23/2013 0806   CL 96 01/06/2014 0354   CL 103 03/23/2012 0938   CO2 29 01/06/2014 0354   CO2 21* 06/23/2013 0806   GLUCOSE 237* 01/06/2014 0354   GLUCOSE 223* 06/23/2013 0806   GLUCOSE 319* 03/23/2012 0938   BUN 13 01/06/2014 0354   BUN 21.7 06/23/2013 0806   CREATININE 1.03 01/06/2014 0354   CREATININE 1.1 06/23/2013 0806   CALCIUM 10.8* 01/06/2014 0354   CALCIUM 10.3 06/23/2013 0806   PROT 6.5 01/06/2014 0354    PROT 7.2 06/23/2013 0806   ALBUMIN 3.0* 01/06/2014 0354   ALBUMIN 3.2* 06/23/2013 0806   AST 34 01/06/2014 0354   AST 43* 06/23/2013 0806   ALT 25 01/06/2014 0354   ALT 49 06/23/2013 0806   ALKPHOS 111 01/06/2014 0354   ALKPHOS 138 06/23/2013 0806   BILITOT 0.4 01/06/2014 0354   BILITOT 0.31 06/23/2013 0806   GFRNONAA 57* 01/06/2014 0354   GFRAA 67* 01/06/2014 0354   Lipase     Component Value Date/Time   LIPASE 36 03/08/2013 0110       Studies/Results: Ct Abdomen Pelvis W Contrast  01/04/2014   CLINICAL DATA:  Abdominal pain.  Colostomy.  Fever.  Cirrhosis.  EXAM: CT ABDOMEN AND PELVIS WITH CONTRAST  TECHNIQUE: Multidetector CT imaging of the abdomen and pelvis was performed using the standard protocol following bolus administration of intravenous contrast.  CONTRAST:  171mL OMNIPAQUE IOHEXOL 300 MG/ML  SOLN  COMPARISON:  CT abdomen pelvis 11/29/2013  FINDINGS: Lung bases are clear aside from mild scarring. Cardiac enlargement. Pacemaker noted.  Cirrhotic liver with nodular contour of the capsule. The left lobe liver is enlarged and the liver is enlarged overall. Gallbladder is surgically absent. Spleen has been removed. Small regenerating splenules are stable.  No varices are noted.  The kidneys show no obstruction or mass or stone. Pancreas is negative for mass or edema. Common bile duct nondilated  Left colostomy. Large peristomal hernia containing bowel loops. No bowel obstruction.  No free fluid. No mass or adenopathy. No significant spinal abnormality.  IMPRESSION: Cirrhotic liver without ascites.  Large peristomal hernia on the left without bowel obstruction.   Electronically Signed   By: Franchot Gallo M.D.   On: 01/04/2014 16:39   Dg Chest Port 1 View  01/04/2014   CLINICAL DATA:  63 year old female with upper abdominal pain, nausea, weakness, fever. Initial encounter. History of partial left pneumonectomy due to lung cancer.  EXAM: PORTABLE CHEST - 1 VIEW  COMPARISON:  Chest  radiographs 11/27/2013 and earlier.  FINDINGS: Portable AP semi upright view at 1434 hrs. Large body habitus. Lung with elevation of the left hemidiaphragm. Stable left chest cardiac pacemaker. Stable volume loss in the left Stable cardiac size and mediastinal contours. No pneumothorax. No pleural effusion or consolidation. Chronic but increased increased interstitial markings/vascular congestion. No overt edema.  IMPRESSION: Acute on chronic increased interstitial markings could relate to pulmonary vascular congestion or viral/atypical respiratory infection.   Electronically Signed   By: Lars Pinks M.D.   On: 01/04/2014 15:00    Anti-infectives: Anti-infectives    Start     Dose/Rate Route Frequency Ordered Stop   01/06/14 0500  vancomycin (VANCOCIN) IVPB 750 mg/150 ml premix     750 mg150 mL/hr over 60 Minutes Intravenous  Once 01/05/14 1623     01/05/14 1645  vancomycin (VANCOCIN) 1,250 mg in sodium chloride 0.9 % 250 mL IVPB     1,250 mg166.7 mL/hr over 90 Minutes Intravenous Every 12 hours 01/05/14 1623     01/05/14 1600  levofloxacin (LEVAQUIN) IVPB 750 mg  Status:  Discontinued     750 mg100 mL/hr over 90 Minutes Intravenous Every 24 hours 01/04/14 1552 01/05/14 1514   01/05/14 0000  metroNIDAZOLE (FLAGYL) IVPB 500 mg  Status:  Discontinued     500 mg100 mL/hr over 60 Minutes Intravenous Every 8 hours 01/04/14 1552 01/05/14 1514   01/04/14 2200  rifaximin (XIFAXAN) tablet 550 mg  Status:  Discontinued     550 mg Oral 2 times daily 01/04/14 2027 01/05/14 1514   01/04/14 1530  levofloxacin (LEVAQUIN) IVPB 750 mg     750 mg100 mL/hr over 90 Minutes Intravenous  Once 01/04/14 1523 01/04/14 1758   01/04/14 1530  metroNIDAZOLE (FLAGYL) IVPB 500 mg     500 mg100 mL/hr over 60 Minutes Intravenous  Once 01/04/14 1523 01/04/14 1948       Assessment/Plan Chronic abdominal pain Parastomal hernia  H/o Left colectomy/colostomy after perforation during colonoscopy ?subacute bacterial  endocarditis? Chronic GI bleeding  Plan: 1.  She has no urgent indication for operation.  2.  She should have other issues ironed out first and agree with continued evaluation for bleeding.  3.  Her abdominal pain is likely multifactorial and may include the hernia. She is not obstructed.  I don't think I would recommend repair of this in her due to cormorbidities and don't know for sure this would help her pain issues. I think best plan would be follow up as previously planned in our office once she is well. 4.  No bleeding since ostomy bag changed this morning, she says its been slowing down. 5.  Will sign off, follow up with [place] in the office.  Call with questions/concerns.   LOS:  2 days    Coralie Keens 01/06/2014, 9:03 AM Pager: [phone]

## 2014-01-06 NOTE — Anesthesia Preprocedure Evaluation (Addendum)
Anesthesia Evaluation    Airway Mallampati: II  TM Distance: >3 FB Neck ROM: Full    Dental  (+) Poor Dentition, Dental Advisory Given   Pulmonary asthma , pneumonia -, COPD COPD inhaler and oxygen dependent, former smoker,          Cardiovascular hypertension, +CHF + dysrhythmias Atrial Fibrillation + pacemaker     Neuro/Psych  Headaches,  Neuromuscular disease    GI/Hepatic   Endo/Other  diabetes, Insulin Dependent  Renal/GU      Musculoskeletal  (+) Arthritis -,   Abdominal   Peds  Hematology  (+) anemia ,   Anesthesia Other Findings   Reproductive/Obstetrics                            Anesthesia Physical Anesthesia Plan  ASA: III  Anesthesia Plan: MAC   Post-op Pain Management:    Induction:   Airway Management Planned: Nasal Cannula  Additional Equipment:   Intra-op Plan:   Post-operative Plan:   Informed Consent: I have reviewed the patients History and Physical, chart, labs and discussed the procedure including the risks, benefits and alternatives for the proposed anesthesia with the patient or authorized representative who has indicated his/her understanding and acceptance.   Dental advisory given  Plan Discussed with: CRNA, Anesthesiologist and Surgeon  Anesthesia Plan Comments:         Anesthesia Quick Evaluation

## 2014-01-06 NOTE — Anesthesia Postprocedure Evaluation (Signed)
  Anesthesia Post-op Note  Patient: [name]  Procedure(s) Performed: Procedure(s): TRANSESOPHAGEAL ECHOCARDIOGRAM (TEE) (N/A)  Patient Location: Endoscopy Unit  Anesthesia Type:MAC  Level of Consciousness: awake  Airway and Oxygen Therapy: Patient Spontanous Breathing  Post-op Pain: mild  Post-op Assessment: Post-op Vital signs reviewed  Post-op Vital Signs: Reviewed  Last Vitals:  Filed Vitals:   01/06/14 1205  BP: 150/37  Pulse: 73  Temp:   Resp: 16    Complications: No apparent anesthesia complications

## 2014-01-06 NOTE — Progress Notes (Signed)
Inpatient Diabetes Program Recommendations  AACE/ADA: New Consensus Statement on Inpatient Glycemic Control (2013)  Target Ranges:  Prepandial:   less than 140 mg/dL      Peak postprandial:   less than 180 mg/dL (1-2 hours)      Critically ill patients:  140 - 180 mg/dL  Results for OMAR, ORREGO (MRN [id number]) as of 01/06/2014 14:19  Ref. Range 01/05/2014 20:38 01/06/2014 00:19 01/06/2014 04:59 01/06/2014 07:29 01/06/2014 12:17  Glucose-Capillary Latest Range: 70-99 mg/dL 297 (H) 251 (H) 229 (H) 244 (H) 208 (H)   Inpatient Diabetes Program Recommendations Correction (SSI): Please order Novolog correction scale ACHS or Q4H. Pt takes U-500 insulin at home.  Will follow. Thank you  Raoul Pitch BSN, RN,CDE Inpatient Diabetes Coordinator [phone] (team pager)

## 2014-01-07 ENCOUNTER — Encounter (HOSPITAL_COMMUNITY): Payer: Self-pay | Admitting: Cardiology

## 2014-01-07 ENCOUNTER — Telehealth: Payer: Self-pay | Admitting: Internal Medicine

## 2014-01-07 ENCOUNTER — Inpatient Hospital Stay (HOSPITAL_COMMUNITY): Payer: Medicare Other

## 2014-01-07 LAB — GLUCOSE, CAPILLARY
Glucose-Capillary: 187 mg/dL — ABNORMAL HIGH (ref 70–99)
Glucose-Capillary: 187 mg/dL — ABNORMAL HIGH (ref 70–99)
Glucose-Capillary: 191 mg/dL — ABNORMAL HIGH (ref 70–99)
Glucose-Capillary: 239 mg/dL — ABNORMAL HIGH (ref 70–99)
Glucose-Capillary: 248 mg/dL — ABNORMAL HIGH (ref 70–99)
Glucose-Capillary: 303 mg/dL — ABNORMAL HIGH (ref 70–99)

## 2014-01-07 LAB — CBC WITH DIFFERENTIAL/PLATELET
BASOS PCT: 1 % (ref 0–1)
Basophils Absolute: 0.1 10*3/uL (ref 0.0–0.1)
EOS ABS: 1.3 10*3/uL — AB (ref 0.0–0.7)
EOS PCT: 12 % — AB (ref 0–5)
HEMATOCRIT: 33.3 % — AB (ref 36.0–46.0)
HEMOGLOBIN: 10.3 g/dL — AB (ref 12.0–15.0)
LYMPHS PCT: 22 % (ref 12–46)
Lymphs Abs: 2.4 10*3/uL (ref 0.7–4.0)
MCH: 28.6 pg (ref 26.0–34.0)
MCHC: 30.9 g/dL (ref 30.0–36.0)
MCV: 92.5 fL (ref 78.0–100.0)
Monocytes Absolute: 2.2 10*3/uL — ABNORMAL HIGH (ref 0.1–1.0)
Monocytes Relative: 20 % — ABNORMAL HIGH (ref 3–12)
NEUTROS ABS: 4.8 10*3/uL (ref 1.7–7.7)
Neutrophils Relative %: 45 % (ref 43–77)
Platelets: 379 10*3/uL (ref 150–400)
RBC: 3.6 MIL/uL — AB (ref 3.87–5.11)
RDW: 26.1 % — ABNORMAL HIGH (ref 11.5–15.5)
WBC: 10.8 10*3/uL — ABNORMAL HIGH (ref 4.0–10.5)

## 2014-01-07 LAB — CULTURE, BLOOD (ROUTINE X 2)

## 2014-01-07 LAB — VITAMIN D 1,25 DIHYDROXY
VITAMIN D 1, 25 (OH) TOTAL: 40 pg/mL (ref 18–72)
Vitamin D2 1, 25 (OH)2: <8 pg/mL
Vitamin D3 1, 25 (OH)2: 40 pg/mL

## 2014-01-07 LAB — URINE CULTURE

## 2014-01-07 MED ORDER — VANCOMYCIN HCL 10 G IV SOLR
1250.0000 mg | Freq: Two times a day (BID) | INTRAVENOUS | Status: DC
  Administered 2014-01-07: 1250 mg via INTRAVENOUS
  Filled 2014-01-07 (×2): qty 1250

## 2014-01-07 MED ORDER — POLYETHYLENE GLYCOL 3350 17 G PO PACK
17.0000 g | PACK | Freq: Every day | ORAL | Status: DC
  Administered 2014-01-07 – 2014-01-10 (×4): 17 g via ORAL
  Filled 2014-01-07 (×4): qty 1

## 2014-01-07 NOTE — Progress Notes (Signed)
Inpatient Diabetes Program Recommendations  AACE/ADA: New Consensus Statement on Inpatient Glycemic Control (2013)  Target Ranges:  Prepandial:   less than 140 mg/dL      Peak postprandial:   less than 180 mg/dL (1-2 hours)      Critically ill patients:  140 - 180 mg/dL   Results for KAVYA, HAAG (MRN [id number]) as of 01/07/2014 07:22  Ref. Range 01/06/2014 07:29 01/06/2014 12:17 01/06/2014 16:54 01/06/2014 20:00 01/06/2014 23:47 01/07/2014 04:11  Glucose-Capillary Latest Range: 70-99 mg/dL 244 (H) 208 (H) 194 (H) 233 (H) 199 (H) 191 (H)   Diabetes history: DM2 Outpatient Diabetes medications: Metformin 500 mg BID, Humulin R U500 (per patient) as follows  -draws up to 30 units on the syringe) 30 min before breakfast -draws up to 26 units on the syringe) 30 min before lunch  -draws up to 26 units on the syringe) 30 min before dinner Current orders for Inpatient glycemic control: Novolog 0-9 units TID with meals, CBGs Q4H  Inpatient Diabetes Program Recommendations Correction (SSI): CBGs are ordered Q4H and Novolog correction is ordered TID with meals. If patient eating well, please change CBG frequency to ACHS. Please increase Novolog correction scale to moderate and add Novolog bedtime correction scale.  Thanks, Barnie Alderman, RN, MSN, CCRN, CDE Diabetes Coordinator Inpatient Diabetes Program [phone] (Team Pager) [phone] ([facility] office) [phone] [facility] office)

## 2014-01-07 NOTE — Telephone Encounter (Signed)
Returned call to [employer], spoke with rep. Advised them that we have not received a fax from them. She researched and saw that the fax had not gone through. She is to refax order to [place].

## 2014-01-07 NOTE — Progress Notes (Signed)
Medicare Important Message given? YES  (If response is "NO", the following Medicare IM given date fields will be blank)  Date Medicare IM given:  01/07/2014 Medicare IM given by: Jonnie Finner

## 2014-01-07 NOTE — Progress Notes (Signed)
SUBJECTIVE: The patient is doing well today.  At this time, she denies chest pain, shortness of breath, or any new concerns.  TEE 01-06-14 demonstrated normal LV function, trace MR and TR, PFO; no vegetation on valves or pacemaker leads.   CURRENT MEDICATIONS: . antiseptic oral rinse  7 mL Mouth Rinse BID  . atorvastatin  20 mg Oral q morning - 10a  . dicyclomine  10 mg Oral TID  . enoxaparin (LOVENOX) injection  40 mg Subcutaneous Q24H  . folic acid  1 mg Oral QPM  . furosemide  80 mg Oral BID  . hydrALAZINE  37.5 mg Oral BID  . insulin aspart  0-9 Units Subcutaneous TID WC  . insulin aspart  3 Units Subcutaneous TID WC  . iron polysaccharides  150 mg Oral BID  . isosorbide dinitrate  20 mg Oral BID  . losartan  50 mg Oral q morning - 10a  . metoCLOPramide  10 mg Oral TID  . mometasone-formoterol  2 puff Inhalation BID  . pantoprazole  40 mg Oral Daily  . vancomycin  1,250 mg Intravenous Q12H      OBJECTIVE: Physical Exam: Filed Vitals:   01/06/14 1726 01/06/14 2001 01/06/14 2012 01/07/14 0413  BP: 147/57 112/55  119/52  Pulse: 71 72  68  Temp: 98.2 F (36.8 C) 97.8 F (36.6 C)  97.6 F (36.4 C)  TempSrc: Oral Oral  Oral  Resp: 18 18  18   Height:      Weight:  205 lb 4 oz (93.101 kg)    SpO2: 100% 100% 98% 97%    Intake/Output Summary (Last 24 hours) at 01/07/14 0749 Last data filed at 01/07/14 0616  Gross per 24 hour  Intake   1400 ml  Output   4400 ml  Net  -3000 ml    Telemetry reveals sinus rhythm  Physical Exam: Chronically ill appearing middle aged woman, NAD HEENT: Unremarkable,[place], AT Neck:  6 JVD, no thyromegally Back:  No CVA tenderness Lungs:  Clear with no wheezes, rales, or rhonchi HEART:  Regular rate rhythm, no murmurs, no rubs, no clicks Abd:  soft, positive bowel sounds, no organomegally, no rebound, no guarding Ext:  2 plus pulses, 2+ edema, no cyanosis, no clubbing Skin:  No rashes no nodules Neuro:  CN II through XII intact,  motor grossly intact   LABS: Basic Metabolic Panel:  Recent Labs  01/04/14 2102 01/05/14 0525 01/06/14 0354  NA  --  143 136*  K  --  4.5 3.8  CL  --  106 96  CO2  --  25 29  GLUCOSE  --  233* 237*  BUN  --  15 13  CREATININE  --  1.09 1.03  CALCIUM  --  10.5 10.8*  MG 1.9  --   --   PHOS 2.7  --   --    Liver Function Tests:  Recent Labs  01/05/14 0525 01/06/14 0354  AST 35 34  ALT 28 25  ALKPHOS 107 111  BILITOT 0.3 0.4  PROT 6.2 6.5  ALBUMIN 2.7* 3.0*  CBC:  Recent Labs  01/06/14 0354 01/07/14 0500  WBC 11.8* 10.8*  NEUTROABS 5.4 4.8  HGB 9.9* 10.3*  HCT 31.0* 33.3*  MCV 92.5 92.5  PLT 379 379   Hemoglobin A1C:  Recent Labs  01/04/14 2102  HGBA1C 7.9*   Thyroid Function Tests:  Recent Labs  01/04/14 2102  TSH 0.412    RADIOLOGY: Ct Abdomen Pelvis W  Contrast 01/04/2014   CLINICAL DATA:  Abdominal pain.  Colostomy.  Fever.  Cirrhosis.  EXAM: CT ABDOMEN AND PELVIS WITH CONTRAST  TECHNIQUE: Multidetector CT imaging of the abdomen and pelvis was performed using the standard protocol following bolus administration of intravenous contrast.  CONTRAST:  151mL OMNIPAQUE IOHEXOL 300 MG/ML  SOLN  COMPARISON:  CT abdomen pelvis 11/29/2013  FINDINGS: Lung bases are clear aside from mild scarring. Cardiac enlargement. Pacemaker noted.  Cirrhotic liver with nodular contour of the capsule. The left lobe liver is enlarged and the liver is enlarged overall. Gallbladder is surgically absent. Spleen has been removed. Small regenerating splenules are stable. No varices are noted.  The kidneys show no obstruction or mass or stone. Pancreas is negative for mass or edema. Common bile duct nondilated  Left colostomy. Large peristomal hernia containing bowel loops. No bowel obstruction.  No free fluid. No mass or adenopathy. No significant spinal abnormality.  IMPRESSION: Cirrhotic liver without ascites.  Large peristomal hernia on the left without bowel obstruction.    Electronically Signed   By: Franchot Gallo M.D.   On: 01/04/2014 16:39   Dg Chest Port 1 View 01/04/2014   CLINICAL DATA:  63 year old female with upper abdominal pain, nausea, weakness, fever. Initial encounter. History of partial left pneumonectomy due to lung cancer.  EXAM: PORTABLE CHEST - 1 VIEW  COMPARISON:  Chest radiographs 11/27/2013 and earlier.  FINDINGS: Portable AP semi upright view at 1434 hrs. Large body habitus. Lung with elevation of the left hemidiaphragm. Stable left chest cardiac pacemaker. Stable volume loss in the left Stable cardiac size and mediastinal contours. No pneumothorax. No pleural effusion or consolidation. Chronic but increased increased interstitial markings/vascular congestion. No overt edema.  IMPRESSION: Acute on chronic increased interstitial markings could relate to pulmonary vascular congestion or viral/atypical respiratory infection.   Electronically Signed   By: Lars Pinks M.D.   On: 01/04/2014 15:00    ASSESSMENT AND PLAN:  Active Problems:   Type 2 diabetes, uncontrolled, with neuropathy   Anemia   Hypercalcemia   Abdominal pain   Bacteremia   Cardiac pacemaker   UTI (lower urinary tract infection) Rec: As TEE negative for endocarditis, I would suggest continued anti-biotics, surveillance blood cultures and not proceeding with PPM extraction unless she has recurrent bacteremia. Please call if this is the case. Otherwise we will sign off.  Cristopher Peru, M.D.

## 2014-01-07 NOTE — Progress Notes (Addendum)
ANTIBIOTIC CONSULT NOTE - FOLLOW UP  Pharmacy Consult: Vancomycin Indication: Bacteremia  Afebrile  98.2 F (36.8 C) (Oral) , Lab Results  Component Value Date   WBC 10.8* 01/07/2014    Labs:  Recent Labs  01/05/14 0525 01/06/14 0354 01/07/14 0500  WBC 12.1* 11.8* 10.8*  HGB 9.2* 9.9* 10.3*  PLT 378 379 379  CREATININE 1.09 1.03  --     Estimated Creatinine Clearance: 58.4 mL/min (by C-G formula based on Cr of 1.03).  Vancomycin Trough drawn.  Unable to process due to excessive hemolysis of sample.   Current Medication[s] Include:   Antibiotic[s]: Anti-infectives    Start     Dose/Rate Route Frequency Ordered Stop   01/07/14 1700  vancomycin (VANCOCIN) 1,250 mg in sodium chloride 0.9 % 250 mL IVPB     1,250 mg166.7 mL/hr over 90 Minutes Intravenous Every 12 hours 01/07/14 1027     01/06/14 0500  vancomycin (VANCOCIN) IVPB 750 mg/150 ml premix  Status:  Discontinued     750 mg150 mL/hr over 60 Minutes Intravenous  Once 01/05/14 1623 01/06/14 1428   01/05/14 1645  vancomycin (VANCOCIN) 1,250 mg in sodium chloride 0.9 % 250 mL IVPB  Status:  Discontinued     1,250 mg166.7 mL/hr over 90 Minutes Intravenous Every 12 hours 01/05/14 1623 01/07/14 1027   01/05/14 1600  levofloxacin (LEVAQUIN) IVPB 750 mg  Status:  Discontinued     750 mg100 mL/hr over 90 Minutes Intravenous Every 24 hours 01/04/14 1552 01/05/14 1514   01/05/14 0000  metroNIDAZOLE (FLAGYL) IVPB 500 mg  Status:  Discontinued     500 mg100 mL/hr over 60 Minutes Intravenous Every 8 hours 01/04/14 1552 01/05/14 1514   01/04/14 2200  rifaximin (XIFAXAN) tablet 550 mg  Status:  Discontinued     550 mg Oral 2 times daily 01/04/14 2027 01/05/14 1514   01/04/14 1530  levofloxacin (LEVAQUIN) IVPB 750 mg     750 mg100 mL/hr over 90 Minutes Intravenous  Once 01/04/14 1523 01/04/14 1758   01/04/14 1530  metroNIDAZOLE (FLAGYL) IVPB 500 mg     500 mg100 mL/hr over 60 Minutes Intravenous  Once 01/04/14 1523 01/04/14 1948      Assessment:  Noted that ID recommends at least 1 month of Vancomycin.  Patient to continue as previously ordered.  Plan:  1. Will redraw Vancomycin trough prior to next dose.  Uilani Sanville, Craig Guess,  Pharm.D.   01/07/2014,6:30 PM

## 2014-01-07 NOTE — Telephone Encounter (Signed)
[employer] would like to follow up on orders    Please advise

## 2014-01-07 NOTE — Progress Notes (Signed)
INFECTIOUS DISEASE PROGRESS NOTE  ID: [name] is a 63 y.o. female with  Active Problems:   Type 2 diabetes, uncontrolled, with neuropathy   Anemia   Hypercalcemia   Abdominal pain   Bacteremia   Cardiac pacemaker   UTI (lower urinary tract infection)  Subjective: Upset, doesn't think she is ready to go home.  constipated  Abtx:  Anti-infectives    Start     Dose/Rate Route Frequency Ordered Stop   01/07/14 1700  vancomycin (VANCOCIN) 1,250 mg in sodium chloride 0.9 % 250 mL IVPB     1,250 mg166.7 mL/hr over 90 Minutes Intravenous Every 12 hours 01/07/14 1027     01/06/14 0500  vancomycin (VANCOCIN) IVPB 750 mg/150 ml premix  Status:  Discontinued     750 mg150 mL/hr over 60 Minutes Intravenous  Once 01/05/14 1623 01/06/14 1428   01/05/14 1645  vancomycin (VANCOCIN) 1,250 mg in sodium chloride 0.9 % 250 mL IVPB  Status:  Discontinued     1,250 mg166.7 mL/hr over 90 Minutes Intravenous Every 12 hours 01/05/14 1623 01/07/14 1027   01/05/14 1600  levofloxacin (LEVAQUIN) IVPB 750 mg  Status:  Discontinued     750 mg100 mL/hr over 90 Minutes Intravenous Every 24 hours 01/04/14 1552 01/05/14 1514   01/05/14 0000  metroNIDAZOLE (FLAGYL) IVPB 500 mg  Status:  Discontinued     500 mg100 mL/hr over 60 Minutes Intravenous Every 8 hours 01/04/14 1552 01/05/14 1514   01/04/14 2200  rifaximin (XIFAXAN) tablet 550 mg  Status:  Discontinued     550 mg Oral 2 times daily 01/04/14 2027 01/05/14 1514   01/04/14 1530  levofloxacin (LEVAQUIN) IVPB 750 mg     750 mg100 mL/hr over 90 Minutes Intravenous  Once 01/04/14 1523 01/04/14 1758   01/04/14 1530  metroNIDAZOLE (FLAGYL) IVPB 500 mg     500 mg100 mL/hr over 60 Minutes Intravenous  Once 01/04/14 1523 01/04/14 1948      Medications:  Scheduled: . antiseptic oral rinse  7 mL Mouth Rinse BID  . atorvastatin  20 mg Oral q morning - 10a  . dicyclomine  10 mg Oral TID  . enoxaparin (LOVENOX) injection  40 mg Subcutaneous Q24H  . folic  acid  1 mg Oral QPM  . furosemide  80 mg Oral BID  . hydrALAZINE  37.5 mg Oral BID  . insulin aspart  0-9 Units Subcutaneous TID WC  . insulin aspart  3 Units Subcutaneous TID WC  . iron polysaccharides  150 mg Oral BID  . isosorbide dinitrate  20 mg Oral BID  . losartan  50 mg Oral q morning - 10a  . metoCLOPramide  10 mg Oral TID  . mometasone-formoterol  2 puff Inhalation BID  . pantoprazole  40 mg Oral Daily  . vancomycin  1,250 mg Intravenous Q12H    Objective: Vital signs in last 24 hours: Temp:  [97.6 F (36.4 C)-98.2 F (36.8 C)] 97.6 F (36.4 C) (12/11 0413) Pulse Rate:  [64-72] 64 (12/11 0924) Resp:  [18] 18 (12/11 0924) BP: (112-147)/(50-57) 130/50 mmHg (12/11 0924) SpO2:  [97 %-100 %] 98 % (12/11 0924) Weight:  [93.101 kg (205 lb 4 oz)] 93.101 kg (205 lb 4 oz) (12/10 2001)   General appearance: alert and mild distress Resp: clear to auscultation bilaterally Cardio: regular rate and rhythm GI: normal findings: bowel sounds normal and soft, non-tender and abnormal findings:  distended  Lab Results  Recent Labs  01/05/14 0525 01/06/14 0354  01/07/14 0500  WBC 12.1* 11.8* 10.8*  HGB 9.2* 9.9* 10.3*  HCT 29.2* 31.0* 33.3*  NA 143 136*  --   K 4.5 3.8  --   CL 106 96  --   CO2 25 29  --   BUN 15 13  --   CREATININE 1.09 1.03  --    Liver Panel  Recent Labs  01/05/14 0525 01/06/14 0354  PROT 6.2 6.5  ALBUMIN 2.7* 3.0*  AST 35 34  ALT 28 25  ALKPHOS 107 111  BILITOT 0.3 0.4   Sedimentation Rate  Recent Labs  01/04/14 2102  ESRSEDRATE 40*   C-Reactive Protein No results for input(s): CRP in the last 72 hours.  Microbiology: Recent Results (from the past 240 hour(s))  Blood Culture (routine x 2)     Status: None   Collection Time: 01/04/14  2:05 PM  Result Value Ref Range Status   Specimen Description BLOOD RIGHT ANTECUBITAL  Final   Special Requests BOTTLES DRAWN AEROBIC AND ANAEROBIC 5CC  Final   Culture  Setup Time   Final     01/04/2014 17:59 Performed at [facility]    Culture   Final    VIRIDANS STREPTOCOCCUS Note: Gram Stain Report Called to,Read Back By and Verified With: CESE BOKIAGON 01/05/14 [id number] [place] Performed at [facility]    Report Status 01/07/2014 FINAL  Final   Organism ID, Bacteria VIRIDANS STREPTOCOCCUS  Final      Susceptibility   Viridans streptococcus - MIC (ETEST)*    PENICILLIN .094 SENSITIVE Sensitive     * VIRIDANS STREPTOCOCCUS  Urine culture     Status: None   Collection Time: 01/04/14  2:10 PM  Result Value Ref Range Status   Specimen Description URINE, RANDOM  Final   Special Requests NONE  Final   Culture  Setup Time   Final    01/04/2014 21:44 Performed at [place]   Final    >=100,000 COLONIES/ML Performed at [facility]    Culture   Final    ENTEROCOCCUS SPECIES Performed at [facility]    Report Status 01/07/2014 FINAL  Final   Organism ID, Bacteria ENTEROCOCCUS SPECIES  Final      Susceptibility   Enterococcus species - MIC*    AMPICILLIN <=2 SENSITIVE Sensitive     LEVOFLOXACIN >=8 RESISTANT Resistant     NITROFURANTOIN <=16 SENSITIVE Sensitive     VANCOMYCIN 1 SENSITIVE Sensitive     TETRACYCLINE >=16 RESISTANT Resistant     * ENTEROCOCCUS SPECIES  Blood Culture (routine x 2)     Status: None   Collection Time: 01/04/14  2:15 PM  Result Value Ref Range Status   Specimen Description BLOOD LEFT ANTECUBITAL  Final   Special Requests BOTTLES DRAWN AEROBIC AND ANAEROBIC 5CC  Final   Culture  Setup Time   Final    01/04/2014 17:59 Performed at [facility]    Culture   Final    VIRIDANS STREPTOCOCCUS Note: SUSCEPTIBILITIES PERFORMED ON PREVIOUS CULTURE WITHIN THE LAST 5 DAYS. Note: Gram Stain Report Called to,Read Back By and Verified With: Virgilio Frees @ [place] [id number] BY [facility] Performed at [facility]    Report Status 01/07/2014 FINAL  Final    Studies/Results: [place]  Chest  01/07/2014   CLINICAL DATA:  Bacteremia. Evaluate for abscess at pacer placement site.  EXAM: ULTRASOUND OF CHEST SOFT TISSUES  TECHNIQUE: Ultrasound examination of  the chest wall soft tissues was performed in the area of clinical concern.  COMPARISON:  01/04/2014  FINDINGS: No soft tissue mass or fluid collection identified in the area of concern.  IMPRESSION: Negative exam.  No abscess identified at pacer site.   Electronically Signed   By: Kerby Moors M.D.   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  Repeat BCx is no growth to date.  Would repeat BCx 1 week after completing anbx.  Her allergy hx from [facility] is tongue swelling with Keflex. Would like to change her to ceftriaxone to complete 1 month of anbx after she finishes tx for her UTI. However, given her allergy hx, will leave her on vanco for 1 month.  Suspect strep is from her gut.           [name] Infectious Diseases (pager) [phone] [email or web address] 01/07/2014, 2:35 PM  LOS: 3 days

## 2014-01-07 NOTE — Progress Notes (Signed)
Pt stated she passed two large, hard balls of stool. After stool was passed, a moderate amount of blood was noted pooling in the stoma. MD notified. Will continue to monitor.

## 2014-01-07 NOTE — Progress Notes (Signed)
[name] GDJ:[id number] DOB: 17-Nov-1950 DOA: 01/04/2014 PCP: [name], MD  Brief narrative: 63 y/o ? 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 She was found to have gram positive cocci in chains on 2/2 blood cultures Cardiology consulted, TEE neg ID consulted given 2/2 bottles Gr+ cocci-  history-As per Problem list Chart reviewed as below  Consultants:  Reviewed   Procedures: Gastroenterology  Cardiology Gen surgery  Antibiotics:  Rifaximin 12/8-12/9  Flagyl 12/8-12/9  Levaquin 12/8-12/9  Vancomycin 12/9--   Subjective   Looks fair Anxious however and tearful pain moderate Having hard stools as well No n/v No CP No blurred or double vision   Objective    Interim History:Reviewed  Telemetry:    Objective: Filed Vitals:   01/06/14 2001 01/06/14 2012 01/07/14 0413 01/07/14 0924  BP: 112/55  119/52 130/50  Pulse: 72  68 64  Temp: 97.8 F (36.6 C)  97.6 F (36.4 C)   TempSrc: Oral  Oral Oral  Resp: 18  18 18   Height:      Weight: 93.101 kg (205 lb 4 oz)     SpO2: 100% 98% 97% 98%    Intake/Output Summary (Last 24 hours) at 01/07/14 1452 Last data filed at 01/07/14 1021  Gross per 24 hour  Intake   1640 ml  Output   4851 ml  Net  -3211 ml    Exam:  General: Alert wasn't oriented no apparent distress Cardiovascular: S1-S2 no murmur rub or gallop Respiratory: Clinically clear Abdomen: Soft nontender ostomy intact.  No shifting dullness.   Skin no lower extremity edema Neuro intact  Data Reviewed: Basic Metabolic Panel:  Recent Labs Lab 01/04/14 1405  01/04/14 2102 01/05/14 0525 01/06/14 0354  NA 137  --  143 136*  K 3.9  --  4.5 3.8  CL 96  --  106 96  CO2 26  --  25 29  GLUCOSE 285*  --  233* 237*  BUN 18  --  15 13  CREATININE 1.17*  --  1.09 1.03  CALCIUM 11.5*  --  10.5 10.8*  MG  --  1.9  --   --   PHOS  --  2.7  --   --    Liver Function Tests:  Recent Labs Lab 01/04/14 1405 01/05/14 0525 01/06/14 0354  AST 45* 35 34  ALT 35 28 25  ALKPHOS 124* 107 111  BILITOT 0.4 0.3 0.4  PROT 6.9 6.2 6.5  ALBUMIN 3.3* 2.7* 3.0*   No results for input(s): LIPASE, AMYLASE in the last 168 hours. No results for input(s): AMMONIA in the last 168 hours. CBC:  Recent Labs Lab 01/04/14 1405 01/05/14 0525 01/06/14 0354 01/07/14 0500  WBC 14.3* 12.1* 11.8* 10.8*  NEUTROABS 7.9* 5.8 5.4 4.8  HGB 9.7* 9.2* 9.9* 10.3*  HCT 30.8* 29.2* 31.0* 33.3*  MCV 90.3 93.9 92.5 92.5  PLT 387 378 379 379   Cardiac Enzymes: No results for input(s): CKTOTAL, CKMB, CKMBINDEX, TROPONINI in the last 168 hours. BNP: Invalid input(s): POCBNP CBG:  Recent Labs Lab 01/06/14 2000 01/06/14 2347 01/07/14 0411 01/07/14 0745 01/07/14 1142  GLUCAP 233* 199* 191*  187* 187*    Recent Results (from the past 240 hour(s))  Blood Culture (routine x 2)     Status: None   Collection Time: 01/04/14  2:05 PM  Result Value Ref Range Status   Specimen Description BLOOD RIGHT ANTECUBITAL  Final   Special Requests BOTTLES DRAWN AEROBIC AND ANAEROBIC 5CC  Final   Culture  Setup Time   Final    01/04/2014 17:59 Performed at [facility]    Culture   Final    VIRIDANS STREPTOCOCCUS Note: Gram Stain Report Called to,Read Back By and Verified With: CESE BOKIAGON 01/05/14 0657A [place] Performed at [facility]    Report Status 01/07/2014 FINAL  Final   Organism ID, Bacteria VIRIDANS STREPTOCOCCUS  Final      Susceptibility   Viridans streptococcus - MIC (ETEST)*    PENICILLIN .094 SENSITIVE Sensitive     * VIRIDANS STREPTOCOCCUS   Urine culture     Status: None   Collection Time: 01/04/14  2:10 PM  Result Value Ref Range Status   Specimen Description URINE, RANDOM  Final   Special Requests NONE  Final   Culture  Setup Time   Final    01/04/2014 21:44 Performed at [facility]   Final    >=100,000 COLONIES/ML Performed at [facility]    Culture   Final    ENTEROCOCCUS SPECIES Performed at [facility]    Report Status 01/07/2014 FINAL  Final   Organism ID, Bacteria ENTEROCOCCUS SPECIES  Final      Susceptibility   Enterococcus species - MIC*    AMPICILLIN <=2 SENSITIVE Sensitive     LEVOFLOXACIN >=8 RESISTANT Resistant     NITROFURANTOIN <=16 SENSITIVE Sensitive     VANCOMYCIN 1 SENSITIVE Sensitive     TETRACYCLINE >=16 RESISTANT Resistant     * ENTEROCOCCUS SPECIES  Blood Culture (routine x 2)     Status: None   Collection Time: 01/04/14  2:15 PM  Result Value Ref Range Status   Specimen Description BLOOD LEFT ANTECUBITAL  Final   Special Requests BOTTLES DRAWN AEROBIC AND ANAEROBIC 5CC  Final   Culture  Setup Time   Final    01/04/2014 17:59 Performed at [facility]    Culture   Final    VIRIDANS STREPTOCOCCUS Note: SUSCEPTIBILITIES PERFORMED ON PREVIOUS CULTURE WITHIN THE LAST 5 DAYS. Note: Gram Stain Report Called to,Read Back By and Verified With: Virgilio Frees @ [place] [id number] BY [facility] Performed at [facility]    Report Status 01/07/2014 FINAL  Final     Studies:              All Imaging reviewed and is as per above notation   Scheduled Meds: . antiseptic oral rinse  7 mL Mouth Rinse BID  . atorvastatin  20 mg Oral q morning - 10a  . dicyclomine  10 mg Oral TID  . enoxaparin (LOVENOX) injection  40 mg Subcutaneous Q24H  . folic acid  1 mg Oral QPM  . furosemide  80 mg Oral BID  . hydrALAZINE  37.5 mg Oral BID  . insulin aspart  0-9 Units Subcutaneous TID WC  . insulin aspart  3 Units Subcutaneous TID WC  . iron  polysaccharides  150 mg Oral BID  . isosorbide dinitrate  20 mg Oral BID  . losartan  50 mg Oral q morning - 10a  . metoCLOPramide  10 mg Oral TID  .  mometasone-formoterol  2 puff Inhalation BID  . pantoprazole  40 mg Oral Daily  . vancomycin  1,250 mg Intravenous Q12H   Continuous Infusions:    Assessment/Plan:  1. Possible subacute upper GI bleed secondary to gastropathy-Last EGD 03/2012 confirmed presence of gastropathy however EGD 11/02/13 was negative.  GI has signed off, Gen surg wishes to pursue stomal revision as OP.  Hb stable.  No further bleeding.  OP management.  I will forward to Dr. [name] her Reg GI MD  As FYI--Advanced diet to full regular diet 2. Gram-positive bacteremia- antibiotics Flagyl, Levaquin, rifaximin-Vancomycin under guidance Dr. Megan Salon ID who i telephone consulted.  Unclear source-urine is dirty but I do not suspect this as this was a "clean catch" specimen.  she does have however a pacemaker-TEE done-Cardiology consulted-No overt Veggies on Valve.  PICC line to be placed today per Dr. Adline Potter 5 mor edays IV vancomycin, Follow up cultures in 1 week and close monitoring by PCP 3. 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 For now patient to continue other medications  4. 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 No evidences of anasarca.  Rifaximin mean is a second line therapy and ideally the patient should be on lactulose prior to transition to this.  Strict ins and outs 5. Mild COPD-continue oxygen therapy, dulera 2 puffs twice a day , DuoNeb 3 mils every 4 when necessary, 6. Hypertension-continue losartan 50 mg every morning, hydralazine 37.5 twice a day, Lasix 80 twice a day, Imdur 20 twice a day.moderate controlled -might need increase in medications 7.  anxiety-continue diazepam 5 mg 3 times a day when necessary-will  need CBT As Outpatient per primary physician  8.  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 History of iron deficiency anemia-monitor labs.    Code Status: full Family Communication:  None present Disposition Plan: inpatient   Verneita Griffes, MD  [facility] Pager [phone] 01/07/2014, 2:52 PM    LOS: 3 days

## 2014-01-08 LAB — CBC WITH DIFFERENTIAL/PLATELET
BASOS PCT: 2 % — AB (ref 0–1)
Basophils Absolute: 0.2 10*3/uL — ABNORMAL HIGH (ref 0.0–0.1)
EOS ABS: 1.2 10*3/uL — AB (ref 0.0–0.7)
EOS PCT: 12 % — AB (ref 0–5)
HCT: 32.2 % — ABNORMAL LOW (ref 36.0–46.0)
HEMOGLOBIN: 10 g/dL — AB (ref 12.0–15.0)
LYMPHS PCT: 22 % (ref 12–46)
Lymphs Abs: 2.1 10*3/uL (ref 0.7–4.0)
MCH: 29.4 pg (ref 26.0–34.0)
MCHC: 31.1 g/dL (ref 30.0–36.0)
MCV: 94.7 fL (ref 78.0–100.0)
Monocytes Absolute: 2 10*3/uL — ABNORMAL HIGH (ref 0.1–1.0)
Monocytes Relative: 21 % — ABNORMAL HIGH (ref 3–12)
Neutro Abs: 4.1 10*3/uL (ref 1.7–7.7)
Neutrophils Relative %: 43 % (ref 43–77)
Platelets: 361 10*3/uL (ref 150–400)
RBC: 3.4 MIL/uL — AB (ref 3.87–5.11)
RDW: 25.7 % — ABNORMAL HIGH (ref 11.5–15.5)
WBC: 9.6 10*3/uL (ref 4.0–10.5)

## 2014-01-08 LAB — COMPREHENSIVE METABOLIC PANEL
ALK PHOS: 109 U/L (ref 39–117)
ALT: 20 U/L (ref 0–35)
AST: 29 U/L (ref 0–37)
Albumin: 3.1 g/dL — ABNORMAL LOW (ref 3.5–5.2)
Anion gap: 15 (ref 5–15)
BILIRUBIN TOTAL: 0.3 mg/dL (ref 0.3–1.2)
BUN: 13 mg/dL (ref 6–23)
CHLORIDE: 96 meq/L (ref 96–112)
CO2: 25 meq/L (ref 19–32)
Calcium: 11 mg/dL — ABNORMAL HIGH (ref 8.4–10.5)
Creatinine, Ser: 1.1 mg/dL (ref 0.50–1.10)
GFR calc non Af Amer: 53 mL/min — ABNORMAL LOW (ref >90)
GFR, EST AFRICAN AMERICAN: 61 mL/min — AB (ref >90)
GLUCOSE: 211 mg/dL — AB (ref 70–99)
POTASSIUM: 4.1 meq/L (ref 3.7–5.3)
Sodium: 136 mEq/L — ABNORMAL LOW (ref 137–147)
TOTAL PROTEIN: 6.6 g/dL (ref 6.0–8.3)

## 2014-01-08 LAB — GLUCOSE, CAPILLARY
GLUCOSE-CAPILLARY: 190 mg/dL — AB (ref 70–99)
GLUCOSE-CAPILLARY: 263 mg/dL — AB (ref 70–99)
GLUCOSE-CAPILLARY: 345 mg/dL — AB (ref 70–99)
Glucose-Capillary: 184 mg/dL — ABNORMAL HIGH (ref 70–99)
Glucose-Capillary: 308 mg/dL — ABNORMAL HIGH (ref 70–99)

## 2014-01-08 LAB — PTH-RELATED PEPTIDE: PTH-RELATED PEPTIDE: 16 pg/mL (ref 14–27)

## 2014-01-08 LAB — VANCOMYCIN, TROUGH: VANCOMYCIN TR: 39.1 ug/mL — AB (ref 10.0–20.0)

## 2014-01-08 MED ORDER — INSULIN ASPART 100 UNIT/ML ~~LOC~~ SOLN
0.0000 [IU] | Freq: Every day | SUBCUTANEOUS | Status: DC
  Administered 2014-01-08: 4 [IU] via SUBCUTANEOUS
  Administered 2014-01-09: 3 [IU] via SUBCUTANEOUS

## 2014-01-08 MED ORDER — INSULIN ASPART 100 UNIT/ML ~~LOC~~ SOLN
0.0000 [IU] | Freq: Three times a day (TID) | SUBCUTANEOUS | Status: DC
  Administered 2014-01-09: 5 [IU] via SUBCUTANEOUS
  Administered 2014-01-09 (×2): 3 [IU] via SUBCUTANEOUS
  Administered 2014-01-10 (×2): 9 [IU] via SUBCUTANEOUS
  Administered 2014-01-10: 5 [IU] via SUBCUTANEOUS

## 2014-01-08 NOTE — Progress Notes (Signed)
[name] RJJ:[id number] DOB: [birth year] DOA: 01/04/2014 PCP: [name], MD  Brief narrative: 63 y/o ? 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 She was found to have gram positive cocci in chains on 2/2 blood cultures Cardiology consulted, TEE neg ID consulted given 2/2 bottles Gr+ cocci-  history-As per Problem list Chart reviewed as below  Consultants:  Reviewed   Procedures: Gastroenterology  Cardiology Gen surgery  Antibiotics:  Rifaximin 12/8-12/9  Flagyl 12/8-12/9  Levaquin 12/8-12/9  Vancomycin 12/9--   Subjective   Looks and feels better.  Tolerating diet somewhat.  Eating somewhat. Stool still hard but taking miralax Feels swollen in abdomen.      Objective    Interim History:Reviewed  Telemetry:    Objective: Filed Vitals:   01/07/14 1742 01/07/14 2002 01/08/14 0443 01/08/14 0816  BP: 126/52 106/36 109/67 111/41  Pulse: 98 60 65 64  Temp: 98.2 F (36.8 C) 97.9 F (36.6 C) 98.8 F (37.1 C)   TempSrc: Oral Axillary Oral   Resp: 18 16 16 18   Height:      Weight:      SpO2: 98% 96% 95% 94%    Intake/Output Summary (Last 24 hours) at 01/08/14 1447 Last data filed at 01/08/14 1300  Gross per 24 hour  Intake   1320 ml  Output   1800 ml  Net   -480 ml    Exam:  General: Alert wasn't oriented no apparent distress Cardiovascular: S1-S2 no murmur rub or gallop Respiratory: Clinically clear Abdomen: Soft nontender ostomy intact.  No shifting dullness.   Skin no lower extremity edema Neuro intact  Data Reviewed: Basic Metabolic Panel:  Recent Labs Lab  01/04/14 1405 01/04/14 2102 01/05/14 0525 01/06/14 0354 01/08/14 0510  NA 137  --  143 136* 136*  K 3.9  --  4.5 3.8 4.1  CL 96  --  106 96 96  CO2 26  --  25 29 25   GLUCOSE 285*  --  233* 237* 211*  BUN 18  --  15 13 13   CREATININE 1.17*  --  1.09 1.03 1.10  CALCIUM 11.5*  --  10.5 10.8* 11.0*  MG  --  1.9  --   --   --   PHOS  --  2.7  --   --   --    Liver Function Tests:  Recent Labs Lab 01/04/14 1405 01/05/14 0525 01/06/14 0354 01/08/14 0510  AST 45* 35 34 29  ALT 35 28 25 20   ALKPHOS 124* 107 111 109  BILITOT 0.4 0.3 0.4 0.3  PROT 6.9 6.2 6.5 6.6  ALBUMIN 3.3* 2.7* 3.0* 3.1*   No results for input(s): LIPASE, AMYLASE in the last 168 hours. No results for input(s): AMMONIA in the last 168 hours. CBC:  Recent Labs Lab 01/04/14 1405 01/05/14 0525 01/06/14 0354 01/07/14 0500 01/08/14 0510  WBC 14.3* 12.1* 11.8* 10.8* 9.6  NEUTROABS 7.9* 5.8 5.4 4.8 4.1  HGB 9.7* 9.2* 9.9* 10.3* 10.0*  HCT 30.8* 29.2* 31.0* 33.3* 32.2*  MCV 90.3 93.9 92.5 92.5 94.7  PLT 387 378 379 379 361   Cardiac Enzymes: No results for input(s):  CKTOTAL, CKMB, CKMBINDEX, TROPONINI in the last 168 hours. BNP: Invalid input(s): POCBNP CBG:  Recent Labs Lab 01/07/14 1954 01/07/14 2354 01/08/14 0421 01/08/14 0818 01/08/14 1201  GLUCAP 303* 248* 184* 190* 263*    Recent Results (from the past 240 hour(s))  Blood Culture (routine x 2)     Status: None   Collection Time: 01/04/14  2:05 PM  Result Value Ref Range Status   Specimen Description BLOOD RIGHT ANTECUBITAL  Final   Special Requests BOTTLES DRAWN AEROBIC AND ANAEROBIC 5CC  Final   Culture  Setup Time   Final    01/04/2014 17:59 Performed at [facility]    Culture   Final    VIRIDANS STREPTOCOCCUS Note: Gram Stain Report Called to,Read Back By and Verified With: CESE BOKIAGON 01/05/14 0657A [place] Performed at [facility]    Report Status 01/07/2014 FINAL  Final   Organism ID, Bacteria VIRIDANS  STREPTOCOCCUS  Final      Susceptibility   Viridans streptococcus - MIC (ETEST)*    PENICILLIN .094 SENSITIVE Sensitive     * VIRIDANS STREPTOCOCCUS  Urine culture     Status: None   Collection Time: 01/04/14  2:10 PM  Result Value Ref Range Status   Specimen Description URINE, RANDOM  Final   Special Requests NONE  Final   Culture  Setup Time   Final    01/04/2014 21:44 Performed at [place]   Final    >=100,000 COLONIES/ML Performed at [facility]    Culture   Final    ENTEROCOCCUS SPECIES Performed at [facility]    Report Status 01/07/2014 FINAL  Final   Organism ID, Bacteria ENTEROCOCCUS SPECIES  Final      Susceptibility   Enterococcus species - MIC*    AMPICILLIN <=2 SENSITIVE Sensitive     LEVOFLOXACIN >=8 RESISTANT Resistant     NITROFURANTOIN <=16 SENSITIVE Sensitive     VANCOMYCIN 1 SENSITIVE Sensitive     TETRACYCLINE >=16 RESISTANT Resistant     * ENTEROCOCCUS SPECIES  Blood Culture (routine x 2)     Status: None   Collection Time: 01/04/14  2:15 PM  Result Value Ref Range Status   Specimen Description BLOOD LEFT ANTECUBITAL  Final   Special Requests BOTTLES DRAWN AEROBIC AND ANAEROBIC 5CC  Final   Culture  Setup Time   Final    01/04/2014 17:59 Performed at [facility]    Culture   Final    VIRIDANS STREPTOCOCCUS Note: SUSCEPTIBILITIES PERFORMED ON PREVIOUS CULTURE WITHIN THE LAST 5 DAYS. Note: Gram Stain Report Called to,Read Back By and Verified With: Virgilio Frees @ [place] ON [phone] BY [facility] Performed at [facility]    Report Status 01/07/2014 FINAL  Final  Culture, blood (routine x 2)     Status: None (Preliminary result)   Collection Time: 01/06/14  5:00 PM  Result Value Ref Range Status   Specimen Description BLOOD RIGHT HAND  Final   Special Requests BOTTLES DRAWN AEROBIC ONLY [facility]  Final   Culture  Setup Time   Final    01/07/2014 00:40 Performed at [facility]     Culture   Final           BLOOD CULTURE RECEIVED NO GROWTH TO DATE CULTURE WILL BE HELD FOR 5 DAYS BEFORE ISSUING A FINAL NEGATIVE REPORT Performed at [facility]    Report Status PENDING  Incomplete  Studies:              All Imaging reviewed and is as per above notation   Scheduled Meds: . antiseptic oral rinse  7 mL Mouth Rinse BID  . atorvastatin  20 mg Oral q morning - 10a  . dicyclomine  10 mg Oral TID  . enoxaparin (LOVENOX) injection  40 mg Subcutaneous Q24H  . folic acid  1 mg Oral QPM  . furosemide  80 mg Oral BID  . hydrALAZINE  37.5 mg Oral BID  . insulin aspart  0-9 Units Subcutaneous TID WC  . insulin aspart  3 Units Subcutaneous TID WC  . iron polysaccharides  150 mg Oral BID  . isosorbide dinitrate  20 mg Oral BID  . losartan  50 mg Oral q morning - 10a  . metoCLOPramide  10 mg Oral TID  . mometasone-formoterol  2 puff Inhalation BID  . pantoprazole  40 mg Oral Daily  . polyethylene glycol  17 g Oral Daily   Continuous Infusions:    Assessment/Plan:  1. Possible subacute upper GI bleed secondary to gastropathy-Last EGD 03/2012 confirmed presence of gastropathy however EGD 11/02/13 was negative.  GI has signed off, Gen surg wishes to pursue stomal revision as OP.  Hb stable.  No further bleeding.  OP management.  I will forward to Dr. [name] her Reg GI MD  As FYI--Advanced diet to full regular diet 2. Gram-positive bacteremia- antibiotics Flagyl, Levaquin, rifaximin-Vancomycin under guidance Dr. Megan Salon ID who i telephone consulted.  Unclear source-urine is dirty but I do not suspect this as this was a "clean catch" specimen.  she does have however a pacemaker-TEE done-Cardiology consulted-No overt Veggies on Valve.  PICC line to be placed today per Dr. Adline Potter 5 weeks IV vancomycin, Follow up cultures in 1 week and close monitoring by PCP 3. 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 For now patient to  continue other medications  4. 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 No evidences of anasarca.  Rifaximin mean is a second line therapy and ideally the patient should be on lactulose prior to transition to this.  Strict ins and outs.  We will get US abdomen pelvis to r/o Ascites in am.  If none, I think we can let her go home on IV abx 5. Mild COPD-continue oxygen therapy, dulera 2 puffs twice a day , DuoNeb 3 mils every 4 when necessary, 6. Hypertension-continue losartan 50 mg every morning, hydralazine 37.5 twice a day, Lasix 80 twice a day, Imdur 20 twice a day.moderate controlled -might need increase in medications 7.  anxiety-continue diazepam 5 mg 3 times a day when necessary-will need CBT As Outpatient per primary physician  8.  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 History of iron deficiency anemia-monitor labs.    Code Status: full Family Communication:  None present Disposition Plan: inpatient   Verneita Griffes, MD  [facility] Pager [phone] 01/08/2014, 2:47 PM    LOS: 4 days

## 2014-01-08 NOTE — Progress Notes (Signed)
CRITICAL VALUE ALERT  Critical value received:  Vancomyacin Troph 39.1  Date of notification:  01/08/14  Time of notification:  8127  Critical value read back:Yes.    Nurse who received alert:  Shelbie Hutching  MD notified (1st page):  Narda Bonds, Pharm D  Time of first page:  [phone], dose adjusted

## 2014-01-08 NOTE — Progress Notes (Signed)
ANTIBIOTIC CONSULT NOTE - FOLLOW UP  Pharmacy Consult for Vancomycin  Indication: Bacteremia   Patient Measurements: Height: 5' (152.4 cm) Weight: 205 lb 4 oz (93.101 kg) IBW/kg (Calculated) : 45.5 Intake/Output from previous day: 12/11 0701 - 12/12 0700 In: 1880 [P.O.:1880] Out: 1801 [Urine:1800; Stool:1]  Labs:  Recent Labs  01/06/14 0354 01/07/14 0500 01/08/14 0510  WBC 11.8* 10.8* 9.6  HGB 9.9* 10.3* 10.0*  PLT 379 379 361  CREATININE 1.03  --  1.10   Estimated Creatinine Clearance: 54.7 mL/min (by C-G formula based on Cr of 1.1).  Recent Labs  01/08/14 0510  VANCOTROUGH 39.1*     Assessment: SUPRA-therapeutic vancomycin trough, drawn correctly   Goal of Therapy:  Vancomycin trough level 15-20 mcg/ml  Plan:  -Re-check vancomycin level 12/13 at 0500  Narda Bonds 01/08/2014,7:15 AM

## 2014-01-09 ENCOUNTER — Inpatient Hospital Stay (HOSPITAL_COMMUNITY): Payer: Medicare Other

## 2014-01-09 LAB — GLUCOSE, CAPILLARY
GLUCOSE-CAPILLARY: 229 mg/dL — AB (ref 70–99)
GLUCOSE-CAPILLARY: 227 mg/dL — AB (ref 70–99)
Glucose-Capillary: 256 mg/dL — ABNORMAL HIGH (ref 70–99)
Glucose-Capillary: 258 mg/dL — ABNORMAL HIGH (ref 70–99)

## 2014-01-09 LAB — VANCOMYCIN, RANDOM: Vancomycin Rm: 22 ug/mL

## 2014-01-09 MED ORDER — SODIUM CHLORIDE 0.9 % IJ SOLN: 10.0000 mL | Freq: Two times a day (BID) | INTRAMUSCULAR | Status: DC

## 2014-01-09 MED ORDER — SODIUM CHLORIDE 0.9 % IJ SOLN: 10.0000 mL | INTRAMUSCULAR | Status: DC | PRN

## 2014-01-09 MED ORDER — VANCOMYCIN HCL IN DEXTROSE 1-5 GM/200ML-% IV SOLN
1000.0000 mg | INTRAVENOUS | Status: DC
  Administered 2014-01-09 – 2014-01-10 (×2): 1000 mg via INTRAVENOUS
  Filled 2014-01-09 (×2): qty 200

## 2014-01-09 MED ORDER — SODIUM CHLORIDE 0.9 % IJ SOLN
10.0000 mL | INTRAMUSCULAR | Status: DC | PRN
  Administered 2014-01-10: 10 mL
  Filled 2014-01-09: qty 40

## 2014-01-09 MED ORDER — METFORMIN HCL 500 MG PO TABS
500.0000 mg | ORAL_TABLET | Freq: Two times a day (BID) | ORAL | Status: DC
  Administered 2014-01-09 – 2014-01-10 (×3): 500 mg via ORAL
  Filled 2014-01-09 (×4): qty 1

## 2014-01-09 NOTE — Progress Notes (Signed)
Peripherally Inserted Central Catheter/Midline Placement  The IV Nurse has discussed with the patient and/or persons authorized to consent for the patient, the purpose of this procedure and the potential benefits and risks involved with this procedure.  The benefits include less needle sticks, lab draws from the catheter and patient may be discharged home with the catheter.  Risks include, but not limited to, infection, bleeding, blood clot (thrombus formation), and puncture of an artery; nerve damage and irregular heat beat.  Alternatives to this procedure were also discussed.  PICC/Midline Placement Documentation  PICC / Midline Single Lumen 01/09/14 PICC Right Brachial 39 cm 0 cm (Active)  Indication for Insertion or Continuance of Line Home intravenous therapies (PICC only) 01/09/2014  2:49 PM  Exposed Catheter (cm) 1 cm 01/09/2014  2:49 PM  Site Assessment Clean;Dry;Intact 01/09/2014  2:49 PM  Line Status Flushed;Saline locked;Blood return noted 01/09/2014  2:49 PM  Dressing Change Due 01/16/14 01/09/2014  2:49 PM       [facility]ordan Payment 01/09/2014, 2:51 PM

## 2014-01-09 NOTE — Progress Notes (Signed)
[name] TDH:[id number] DOB: Jan 03, 1951 DOA: 01/04/2014 PCP: [name], MD  Brief narrative: 63 y/o ? 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 She was found to have gram positive cocci in chains on 2/2 blood cultures Cardiology consulted, TEE neg ID consulted given 2/2 bottles Gr+ cocci=Viridans Strep US abdomen ordered r/o Ascites as could have had Colonic transmigration causing bacteremia  history-As per Problem list Chart reviewed as below  Consultants:  Reviewed   Procedures: Gastroenterology  Cardiology Gen surgery  Antibiotics:  Rifaximin 12/8-12/9  Flagyl 12/8-12/9  Levaquin 12/8-12/9  Vancomycin 12/9--   Subjective   Looks and feels better.   Pain controlled No n/v Eating well PICC line placed    Objective    Interim History:Reviewed  Telemetry:    Objective: Filed Vitals:   01/08/14 2059 01/08/14 2150 01/09/14 0550 01/09/14 0727  BP: 127/42  115/43 133/50  Pulse: 63  61 68  Temp:  98.7 F (37.1 C) 98.9 F (37.2 C) 98.7 F (37.1 C)  TempSrc:    Axillary  Resp: 17  17 16   Height:      Weight:  89.359 kg (197 lb)    SpO2: 97%  92% 95%    Intake/Output Summary (Last 24 hours) at 01/09/14 1539 Last data filed at 01/09/14 0500  Gross per 24 hour  Intake    360 ml  Output   2201 ml  Net  -1841 ml    Exam:  General: Alert wasn't oriented no apparent distress Cardiovascular: S1-S2 no murmur rub or gallop Respiratory: Clinically clear Abdomen: Soft nontender ostomy intact.  No shifting dullness.   Skin no lower extremity edema Neuro intact  Data  Reviewed: Basic Metabolic Panel:  Recent Labs Lab 01/04/14 1405 01/04/14 2102 01/05/14 0525 01/06/14 0354 01/08/14 0510  NA 137  --  143 136* 136*  K 3.9  --  4.5 3.8 4.1  CL 96  --  106 96 96  CO2 26  --  25 29 25   GLUCOSE 285*  --  233* 237* 211*  BUN 18  --  15 13 13   CREATININE 1.17*  --  1.09 1.03 1.10  CALCIUM 11.5*  --  10.5 10.8* 11.0*  MG  --  1.9  --   --   --   PHOS  --  2.7  --   --   --    Liver Function Tests:  Recent Labs Lab 01/04/14 1405 01/05/14 0525 01/06/14 0354 01/08/14 0510  AST 45* 35 34 29  ALT 35 28 25 20   ALKPHOS 124* 107 111 109  BILITOT 0.4 0.3 0.4 0.3  PROT 6.9 6.2 6.5 6.6  ALBUMIN 3.3* 2.7* 3.0* 3.1*   No results for input(s): LIPASE, AMYLASE in the last 168 hours. No results for input(s): AMMONIA in the last 168 hours. CBC:  Recent Labs Lab 01/04/14 1405 01/05/14 0525 01/06/14 0354 01/07/14 0500 01/08/14 0510  WBC 14.3* 12.1* 11.8* 10.8* 9.6  NEUTROABS 7.9* 5.8 5.4 4.8 4.1  HGB 9.7* 9.2* 9.9* 10.3* 10.0*  HCT 30.8* 29.2* 31.0* 33.3* 32.2*  MCV 90.3 93.9 92.5 92.5 94.7  PLT 387 378 379 379  361   Cardiac Enzymes: No results for input(s): CKTOTAL, CKMB, CKMBINDEX, TROPONINI in the last 168 hours. BNP: Invalid input(s): POCBNP CBG:  Recent Labs Lab 01/08/14 1201 01/08/14 1748 01/08/14 2056 01/09/14 0726 01/09/14 1200  GLUCAP 263* 308* 345* 256* 229*    Recent Results (from the past 240 hour(s))  Blood Culture (routine x 2)     Status: None   Collection Time: 01/04/14  2:05 PM  Result Value Ref Range Status   Specimen Description BLOOD RIGHT ANTECUBITAL  Final   Special Requests BOTTLES DRAWN AEROBIC AND ANAEROBIC 5CC  Final   Culture  Setup Time   Final    01/04/2014 17:59 Performed at [facility]    Culture   Final    VIRIDANS STREPTOCOCCUS Note: Gram Stain Report Called to,Read Back By and Verified With: CESE BOKIAGON 01/05/14 0657A [place] Performed at [facility]    Report Status  01/07/2014 FINAL  Final   Organism ID, Bacteria VIRIDANS STREPTOCOCCUS  Final      Susceptibility   Viridans streptococcus - MIC (ETEST)*    PENICILLIN .094 SENSITIVE Sensitive     * VIRIDANS STREPTOCOCCUS  Urine culture     Status: None   Collection Time: 01/04/14  2:10 PM  Result Value Ref Range Status   Specimen Description URINE, RANDOM  Final   Special Requests NONE  Final   Culture  Setup Time   Final    01/04/2014 21:44 Performed at [facility]   Final    >=100,000 COLONIES/ML Performed at [facility]    Culture   Final    ENTEROCOCCUS SPECIES Performed at [facility]    Report Status 01/07/2014 FINAL  Final   Organism ID, Bacteria ENTEROCOCCUS SPECIES  Final      Susceptibility   Enterococcus species - MIC*    AMPICILLIN <=2 SENSITIVE Sensitive     LEVOFLOXACIN >=8 RESISTANT Resistant     NITROFURANTOIN <=16 SENSITIVE Sensitive     VANCOMYCIN 1 SENSITIVE Sensitive     TETRACYCLINE >=16 RESISTANT Resistant     * ENTEROCOCCUS SPECIES  Blood Culture (routine x 2)     Status: None   Collection Time: 01/04/14  2:15 PM  Result Value Ref Range Status   Specimen Description BLOOD LEFT ANTECUBITAL  Final   Special Requests BOTTLES DRAWN AEROBIC AND ANAEROBIC 5CC  Final   Culture  Setup Time   Final    01/04/2014 17:59 Performed at [facility]    Culture   Final    VIRIDANS STREPTOCOCCUS Note: SUSCEPTIBILITIES PERFORMED ON PREVIOUS CULTURE WITHIN THE LAST 5 DAYS. Note: Gram Stain Report Called to,Read Back By and Verified With: Virgilio Frees @ [place] [phone] BY [facility] Performed at [facility]    Report Status 01/07/2014 FINAL  Final  Culture, blood (routine x 2)     Status: None (Preliminary result)   Collection Time: 01/06/14  5:00 PM  Result Value Ref Range Status   Specimen Description BLOOD RIGHT HAND  Final   Special Requests BOTTLES DRAWN AEROBIC ONLY [facility]  Final   Culture  Setup Time   Final     01/07/2014 00:40 Performed at [facility]    Culture   Final           BLOOD CULTURE RECEIVED NO GROWTH TO DATE CULTURE WILL BE HELD FOR 5 DAYS BEFORE ISSUING A FINAL NEGATIVE REPORT Performed at [facility]  Report Status PENDING  Incomplete     Studies:              All Imaging reviewed and is as per above notation   Scheduled Meds: . antiseptic oral rinse  7 mL Mouth Rinse BID  . atorvastatin  20 mg Oral q morning - 10a  . dicyclomine  10 mg Oral TID  . enoxaparin (LOVENOX) injection  40 mg Subcutaneous Q24H  . folic acid  1 mg Oral QPM  . furosemide  80 mg Oral BID  . hydrALAZINE  37.5 mg Oral BID  . insulin aspart  0-5 Units Subcutaneous QHS  . insulin aspart  0-9 Units Subcutaneous TID WC  . insulin aspart  3 Units Subcutaneous TID WC  . iron polysaccharides  150 mg Oral BID  . isosorbide dinitrate  20 mg Oral BID  . losartan  50 mg Oral q morning - 10a  . metoCLOPramide  10 mg Oral TID  . mometasone-formoterol  2 puff Inhalation BID  . pantoprazole  40 mg Oral Daily  . polyethylene glycol  17 g Oral Daily  . vancomycin  1,000 mg Intravenous Q24H   Continuous Infusions:    Assessment/Plan:  1. Possible subacute upper GI bleed secondary to gastropathy-Last EGD 03/2012 confirmed presence of gastropathy however EGD 11/02/13 was negative.  GI has signed off, Gen surg wishes to pursue stomal revision as OP.  Hb stable.  No further bleeding.  OP management.  I will forward to Dr. [name] her Reg GI MD  As FYI--Advanced diet to full regular diet 2. Viridans Strep + Enterococcal Pyelo- antibiotics Flagyl, Levaquin, rifaximin-Vancomycin under guidance Dr. Megan Salon ID who i telephone consulted.  Unclear source-urine is dirty but I do not suspect this as this was a "clean catch" specimen.  she does have however a pacemaker-TEE done-Cardiology consulted-No overt Veggies on Valve.  PICC Dr. Adline Potter 5 weeks IV vancomycin, Follow up cultures in 1 week and close  monitoring by PCP 3. 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 For now patient to continue other medications  4. DM-continue metformin PTA med.  Would transition to Regular insulin on d/c-Not U 500 [might be patient prefers less volume for injection?  Will enquire in am].  suagrs 200-300's 5. 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 No evidences of anasarca.  Rifaximin mean is a second line therapy and ideally the patient should be on lactulose prior to transition to this.  Strict ins and outs.  We will get US abdomen pelvis to r/o Ascites in am.  If none, I think we can let her go home on IV abx.  Await [place] abd r/o Ascites Pending 6. Mild COPD-continue oxygen therapy, dulera 2 puffs twice a day , DuoNeb 3 mils every 4 when necessary, 7. Hypertension-continue losartan 50 mg every morning, hydralazine 37.5 twice a day, Lasix 80 twice a day, Imdur 20 twice a day.moderate controlled -controlled 8.  anxiety-continue diazepam 5 mg 3 times a day when necessary-will need CBT As Outpatient per primary physician  9.  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 History of iron deficiency anemia-stable at present.  NO further bleeding with stool during colostomy  Code Status: full Family Communication:  None present Disposition Plan: inpatient-likely d/c home in am 12/13 once US abdomen back   Verneita Griffes, MD  [facility] Pager [phone] 01/09/2014, 3:39 PM    LOS:  5 days

## 2014-01-09 NOTE — Progress Notes (Signed)
ANTIBIOTIC CONSULT NOTE - FOLLOW UP  Pharmacy Consult:  Vancomycin  Indication: Bacteremia  Patient Measurements: Height: 5' (152.4 cm) Weight: 197 lb (89.359 kg) IBW/kg (Calculated) : 45.5 Intake/Output from previous day: 12/12 0701 - 12/13 0700 In: 360 [P.O.:360] Out: 3001 [Urine:3000; Stool:1]  Labs:  Recent Labs  01/07/14 0500 01/08/14 0510  WBC 10.8* 9.6  HGB 10.3* 10.0*  PLT 379 361  CREATININE  --  1.10   Estimated Creatinine Clearance: 53.5 mL/min (by C-G formula based on Cr of 1.1).  Recent Labs  01/08/14 0510 01/09/14 0455  VANCOTROUGH 39.1*  --   VANCORANDOM  --  22.0      Assessment: 55 YOF continues on vancomycin for intra-abdominal infection, Strep viridans bacteremia and Enterococcal UTI.  Patient's renal function has been stable.  First vancomycin trough was supra-therapeutic and vancomycin was subsequently held.  Vancomycin random level this AM decreased but remained slightly above goal.  Flagyl 12/9 >> 12/9 Levaquin 12/19 >> 12/9  Vanc 12/9 >>   12/12 VT = 39.1 mcg/mL on 1250mg  q12 >> hold 12/13 VR = 22 mcg/mL >> resume 1gm q24  12/8 Blood Cx - Strep viridans (PCN sensitive) - allergic to PCN  12/8 UCx - Enterococcus (Vanc and ampicillin sensitive)    Goal of Therapy:  Vancomycin trough level 15-20 mcg/ml   Plan:  - Vanc 1gm IV Q24H - Monitor renal fxn, clinical progress, vanc trough at new Css - F/U resume metformin +/- long-acting insulin or resume home insulin U-500 for better glycemic control    Damia Bobrowski D. Mina Marble, PharmD, BCPS Pager:  [phone] 01/09/2014, 12:59 PM   -

## 2014-01-10 ENCOUNTER — Telehealth: Payer: Self-pay

## 2014-01-10 LAB — GLUCOSE, CAPILLARY
GLUCOSE-CAPILLARY: 398 mg/dL — AB (ref 70–99)
Glucose-Capillary: 254 mg/dL — ABNORMAL HIGH (ref 70–99)
Glucose-Capillary: 388 mg/dL — ABNORMAL HIGH (ref 70–99)

## 2014-01-10 MED ORDER — POLYETHYLENE GLYCOL 3350 17 G PO PACK: 17.0000 g | PACK | Freq: Every day | ORAL | Status: DC

## 2014-01-10 MED ORDER — VANCOMYCIN HCL IN DEXTROSE 1-5 GM/200ML-% IV SOLN: 1000.0000 mg | INTRAVENOUS | Status: DC

## 2014-01-10 MED ORDER — LACTULOSE ENCEPHALOPATHY 10 GM/15ML PO SOLN: 10.0000 g | Freq: Two times a day (BID) | ORAL | Status: DC | PRN

## 2014-01-10 NOTE — Telephone Encounter (Signed)
I have left message for the patient to call back   I spoke with Dr. Henrene Pastor who indicated that he would see the patient again. I will contact her for a follow-up appointment.      Beth, please schedule a follow-up appointment in the next 2-3 weeks   ----- Message -----    From: Nita Sells, MD    Sent: 01/10/2014 11:39 AM     To: Doreen Salvage, MD, [name], MD, *   Subject: Inpatient Notes                     Hi   This complex patient will need close follow-up with gastroenterology, general surgery and primary care physician.   Will need to be seen by PCP maybe in 1 week   Recommend interval follow-up with gastroenterology maybe about 2-3 weeks to determine medications and other things that need to be adjusted with regards to her GI issues   Will need revision of stoma sometime mid-January after antibiotics go through   [place] thank you

## 2014-01-10 NOTE — Discharge Summary (Signed)
Physician Discharge Summary  [name]:[id number] DOB: 1950/04/04 DOA: 01/04/2014  PCP: [name], MD  Admit date: 01/04/2014 Discharge date: 01/10/2014  Time spent: 45 minutes  Recommendations for Outpatient Follow-up:  1. Get vancomycin trough, peak, ESR, CBC, CRP reported as per home health nursing protocol and sent to primary care physician for monitoring. 2. Patient will need interval follow-up for revision of stoma once IV antibiotic courses through 3. I have discontinued patient's Xifaxan as there is no evidence this is superior to lactulose in people who have not had a relapse of encephalopathy. 4. Patient has been instructed that for minor bleeding she may be able to stay at home and get a CBC however if she has a large amount of leading per stoma, she will need to come back to the hospital 5. Her pain medications should be refilled by her primary care physician on a scheduled ongoing basis as per her contract 6. I would recommend psychiatric evaluation for psychiatric overlay 7. Consider outpatient workup of her hypercalcemia if not done already 8. Consider outpatient cardiology input in about one to 2 months   Discharge Diagnoses:  Active Problems:   Type 2 diabetes, uncontrolled, with neuropathy   Anemia   Hypercalcemia   Abdominal pain   Bacteremia   Cardiac pacemaker   UTI (lower urinary tract infection)   Discharge Condition: Fair  Diet recommendation: Heart healthy low-salt as tolerated  Filed Weights   01/06/14 2001 01/08/14 2150 01/09/14 1958  Weight: 93.101 kg (205 lb 4 oz) 89.359 kg (197 lb) 88.905 kg (196 lb)    History of present illness:  63 y/o ? 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 She was found to have gram positive cocci in chains on 2/2 blood cultures Cardiology consulted, TEE neg ID consulted given 2/2 bottles Gr+ cocci=Viridans Strep US abdomen ordered r/o Ascites as could have had Colonic transmigration causing bacteremia.    Hospital Course:  =  1. Possible subacute upper GI bleed secondary to gastropathy-Last EGD 03/2012 confirmed presence of gastropathy however EGD 11/02/13 was negative. GI saw the patient and signed off, Gen surg wishes to pursue stomal revision as OP which will happen once antibiotics have been completed in mid January. Hb stable. No further bleeding. OP management. I will forward to Dr. [name] her Reg GI MD As FYI--Advanced diet to full regular diet and patient tolerated the same  2. Viridans Strep + Enterococcal Pyelo- antibiotics Flagyl, Levaquin, rifaximin-Vancomycin under guidance Dr. Megan Salon ID who I telephone consulted. Unclear source-urine is dirty but I do not suspect this as this was a "clean catch" specimen. she does have however a pacemaker-TEE done-Cardiology consulted-No overt Veggies on Valve. It was recommended that patient get a PICC by Dr. Adline Potter 5 weeks IV vancomycin ending 02/08/13  Follow up cultures in 1 week and close monitoring by PCP-home health nurses to remove PICC line once course of antibiotics is complete unless this would be needed for surgery which is impending 3. 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 For now patient to continue other medications  4. DM-continue metformin PTA med. Would transition to Regular as per  PCP?  Sugars were 200 to 300s this hospital admission  5. 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 No evidences of anasarca. Rifaximin  mean is a second line therapy and ideally the patient should be on lactulose prior to transition to this--I changed her to lactulose. Strict ins and outs. We will get US abdomen pelvis to r/o Ascites in am-US abd neg 6. Mild COPD-continue oxygen therapy, dulera 2 puffs twice a day , DuoNeb 3 mils every 4 when necessary, 7. Hypertension-continue losartan 50 mg every morning, hydralazine 37.5 twice a day, Lasix 80 twice a day, Imdur 20 twice a day.moderate controlled -controlled 8. anxiety-continue diazepam 5 mg 3 times a day when necessary-will need CBT As Outpatient per primary physician 9. 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 History of iron deficiency anemia-stable at present. NO further bleeding with stool during colostomy 11. Stoma for elective revision-we'll forward to gastroenterologist Dr. [name] and [facility] surgery to make aware  Code Status: full  Consultants: Gastroenterology  Cardiology Gen surgery  Antibiotics:  Rifaximin 12/8-12/9  Flagyl 12/8-12/9  Levaquin 12/8-12/9  Vancomycin 12/8--02/08/14  Discharge Exam: Filed Vitals:   01/10/14 0942  BP: 123/48  Pulse: 64  Temp: 98.6 F (37 C)  Resp: 18    General: Alert pleasant oriented no apparent distress  Cardiovascular: S1-S2 no murmur rub or gallop  Respiratory: Clinically clear no added sound   Discharge Instructions You were cared for by a hospitalist during your hospital stay. If you have any questions about your discharge medications or the care you received while you were in the hospital after you are discharged, you can call the unit and asked to speak with the hospitalist on call if the hospitalist that took care of you is not available. Once you are discharged, your primary care physician will handle any further medical issues. Please note that NO REFILLS for any discharge medications will be authorized once you are discharged, as it is  imperative that you return to your primary care physician (or establish a relationship with a primary care physician if you do not have one) for your aftercare needs so that they can reassess your need for medications and monitor your lab values.  Discharge Instructions    Diet - low sodium heart healthy    Complete by:  As directed      Discharge instructions    Complete by:  As directed   Patient to complete 5 weeks of IV vancomycin for enterococci/strep viridans streptococci 02/08/13-home health will help you coordinate this and teach her how to give the medication. He will need to have labs drawn periodically and reported to your primary care physician. Consider asking her physician if you can transition off of U5 100 as this is probably expensive and if taken in the wrong dose can be dangerous Please follow-up with your general surgeon or call them within 2 weeks for reversion of your ostomy. If you experience large amounts of blood from the stoma, please let your regular physician now. I will also ask your regular physician or a gastroenterologist about how often to use your Reglan. I have replaced your Xifaxan with lactulose as there is no evidence that rifampicin is superior to that.     Increase activity slowly    Complete by:  As directed           Current Discharge Medication List    START  taking these medications   Details  lactulose, encephalopathy, (CHRONULAC) 10 GM/15ML SOLN Take 15 mLs (10 g total) by mouth 2 (two) times daily as needed. have at least one to 2 loose stools daily Qty: 2700 mL, Refills: 0    polyethylene glycol (MIRALAX / GLYCOLAX) packet Take 17 g by mouth daily. Qty: 14 each, Refills: 0    vancomycin (VANCOCIN) 1 GM/200ML SOLN Inject 200 mLs (1,000 mg total) into the vein daily. Qty: 4000 mL, Refills: 0      CONTINUE these medications which have NOT CHANGED   Details  albuterol-ipratropium (COMBIVENT) 18-103 MCG/ACT inhaler Inhale 2 puffs into the  lungs every 4 (four) hours as needed for wheezing or shortness of breath.     atorvastatin (LIPITOR) 20 MG tablet Take 20 mg by mouth every morning.    diazepam (VALIUM) 5 MG tablet Take 1 tablet (5 mg total) by mouth 3 (three) times daily as needed for anxiety. Qty: 90 tablet, Refills: 5    dicyclomine (BENTYL) 10 MG capsule Take 10 mg by mouth 3 (three) times daily.     esomeprazole (NEXIUM) 40 MG capsule Take 40 mg by mouth every evening.    Fluticasone-Salmeterol (ADVAIR) 250-50 MCG/DOSE AEPB Inhale 1 puff into the lungs every 12 (twelve) hours. Qty: 180 each, Refills: 3    folic acid (FOLVITE) 1 MG tablet Take 1 tablet (1 mg total) by mouth every evening. Qty: 90 tablet, Refills: 3    furosemide (LASIX) 40 MG tablet Take 2 tablets (80 mg total) by mouth 2 (two) times daily. Qty: 120 tablet, Refills: 3    hydrALAZINE (APRESOLINE) 25 MG tablet Take 1.5 tablets (37.5 mg total) by mouth 2 (two) times daily. Qty: 270 tablet, Refills: 3    insulin regular human CONCENTRATED (HUMULIN R) 500 UNIT/ML SOLN injection Inject under skin 0.30 mL before b'fast, 0.25 mL before lunch and 0.22 mL before dinner Qty: 20 mL, Refills: 3    INSULIN SYRINGE 1CC/29G (B-D INSULIN SYRINGE) 29G X 1/2" 1 ML MISC Use to inject insulin 3 times daily. Qty: 100 each, Refills: 3    iron polysaccharides (FERREX 150) 150 MG capsule Take 1 capsule (150 mg total) by mouth 2 (two) times daily. Qty: 60 capsule, Refills: 3    isosorbide dinitrate (ISORDIL) 20 MG tablet Take 1 tablet (20 mg total) by mouth 2 (two) times daily. Qty: 180 tablet, Refills: 3    losartan (COZAAR) 50 MG tablet Take 50 mg by mouth every morning.    metFORMIN (GLUCOPHAGE) 500 MG tablet Take 1 tablet (500 mg total) by mouth 2 (two) times daily with a meal. Qty: 90 tablet, Refills: 1    methocarbamol (ROBAXIN) 500 MG tablet Take 1 tablet (500 mg total) by mouth every 6 (six) hours as needed for muscle spasms. Qty: 60 tablet, Refills: 0     metoCLOPramide (REGLAN) 10 MG tablet Take 1 tablet (10 mg total) by mouth 3 (three) times daily. Qty: 270 tablet, Refills: 3    Oxycodone HCl 20 MG TABS Take 1 tablet (20 mg total) by mouth every 3 (three) hours as needed (pain). Qty: 240 tablet, Refills: 0    promethazine (PHENERGAN) 25 MG tablet Take 1 tablet (25 mg total) by mouth every 4 (four) hours as needed for nausea or vomiting. Qty: 120 tablet, Refills: 2    Syringe/Needle, Disp, 25G X 1" 1 ML MISC Use 3x a day Qty: 200 each, Refills: 3      STOP taking these medications  levofloxacin (LEVAQUIN) 500 MG tablet      rifaximin (XIFAXAN) 550 MG TABS tablet        Allergies  Allergen Reactions  . Acetaminophen Other (See Comments)    Cirrhosis of liver  . Morphine Other (See Comments)    REACTION: Lowers BP  . Other Other (See Comments)    AGENT:  Per pt, cannot take blood thinners due to cirrhosis of the liver  . Penicillins Anaphylaxis and Rash  . Codeine Phosphate Other (See Comments)    REACTION: Stomach cramps  . Hydrocodone-Acetaminophen Other (See Comments)    REACTION: hallucinations  . Morphine And Related     Blood pressure drops   . Trazodone And Nefazodone     Cardiac arrythmia - DO NOT USE  . Cephalexin Swelling and Rash  . Hydrocodone-Acetaminophen Other (See Comments)    unknown      The results of significant diagnostics from this hospitalization (including imaging, microbiology, ancillary and laboratory) are listed below for reference.    Significant Diagnostic Studies: [place] Chest  01/07/2014   CLINICAL DATA:  Bacteremia. Evaluate for abscess at pacer placement site.  EXAM: ULTRASOUND OF CHEST SOFT TISSUES  TECHNIQUE: Ultrasound examination of the chest wall soft tissues was performed in the area of clinical concern.  COMPARISON:  01/04/2014  FINDINGS: No soft tissue mass or fluid collection identified in the area of concern.  IMPRESSION: Negative exam.  No abscess identified at pacer site.    Electronically Signed   By: Kerby Moors M.D.   On: 01/07/2014 09:17   US Abdomen Complete  01/09/2014   CLINICAL DATA:  Abdominal pain and distension  EXAM: ULTRASOUND ABDOMEN COMPLETE  COMPARISON:  01/04/2014  FINDINGS: Gallbladder: Surgically removed.  Common bile duct: Diameter: 5.3 mm.  Liver: Nodularity to the liver is noted consistent with cirrhotic change. This is stable from the prior CT examination.  IVC: No abnormality visualized.  Pancreas: Visualized portion unremarkable.  Spleen: Surgically removed.  Right Kidney: Length: 10.6 cm. Echogenicity within normal limits. No mass or hydronephrosis visualized.  Left Kidney: Length: 11.2 cm. Echogenicity within normal limits. No mass or hydronephrosis visualized.  Abdominal aorta: No aneurysm visualized.  Other findings: None.  IMPRESSION: Postsurgical changes.  No acute abnormality is noted.  Cirrhotic change of the liver.   Electronically Signed   By: Inez Catalina M.D.   On: 01/09/2014 17:42   Ct Abdomen Pelvis W Contrast  01/04/2014   CLINICAL DATA:  Abdominal pain.  Colostomy.  Fever.  Cirrhosis.  EXAM: CT ABDOMEN AND PELVIS WITH CONTRAST  TECHNIQUE: Multidetector CT imaging of the abdomen and pelvis was performed using the standard protocol following bolus administration of intravenous contrast.  CONTRAST:  18m OMNIPAQUE IOHEXOL 300 MG/ML  SOLN  COMPARISON:  CT abdomen pelvis 11/29/2013  FINDINGS: Lung bases are clear aside from mild scarring. Cardiac enlargement. Pacemaker noted.  Cirrhotic liver with nodular contour of the capsule. The left lobe liver is enlarged and the liver is enlarged overall. Gallbladder is surgically absent. Spleen has been removed. Small regenerating splenules are stable. No varices are noted.  The kidneys show no obstruction or mass or stone. Pancreas is negative for mass or edema. Common bile duct nondilated  Left colostomy. Large peristomal hernia containing bowel loops. No bowel obstruction.  No free fluid. No mass  or adenopathy. No significant spinal abnormality.  IMPRESSION: Cirrhotic liver without ascites.  Large peristomal hernia on the left without bowel obstruction.   Electronically Signed   By:  Franchot Gallo M.D.   On: 01/04/2014 16:39   Dg Chest Port 1 View  01/09/2014   CLINICAL DATA:  Status post PICC line placement  EXAM: PORTABLE CHEST - 1 VIEW  COMPARISON:  None.  FINDINGS: A right-sided PICC line is now seen. The catheter tip is noted at the cavoatrial junction. The lungs are clear. The cardiac shadow is mildly enlarged. A pacing device is noted.  IMPRESSION: Status post PICC line in satisfactory position.   Electronically Signed   By: Inez Catalina M.D.   On: 01/09/2014 15:20   Dg Chest Port 1 View  01/04/2014   CLINICAL DATA:  63 year old female with upper abdominal pain, nausea, weakness, fever. Initial encounter. History of partial left pneumonectomy due to lung cancer.  EXAM: PORTABLE CHEST - 1 VIEW  COMPARISON:  Chest radiographs 11/27/2013 and earlier.  FINDINGS: Portable AP semi upright view at 1434 hrs. Large body habitus. Lung with elevation of the left hemidiaphragm. Stable left chest cardiac pacemaker. Stable volume loss in the left Stable cardiac size and mediastinal contours. No pneumothorax. No pleural effusion or consolidation. Chronic but increased increased interstitial markings/vascular congestion. No overt edema.  IMPRESSION: Acute on chronic increased interstitial markings could relate to pulmonary vascular congestion or viral/atypical respiratory infection.   Electronically Signed   By: Lars Pinks M.D.   On: 01/04/2014 15:00    Microbiology: Recent Results (from the past 240 hour(s))  Blood Culture (routine x 2)     Status: None   Collection Time: 01/04/14  2:05 PM  Result Value Ref Range Status   Specimen Description BLOOD RIGHT ANTECUBITAL  Final   Special Requests BOTTLES DRAWN AEROBIC AND ANAEROBIC 5CC  Final   Culture  Setup Time   Final    01/04/2014 17:59 Performed  at [facility]    Culture   Final    VIRIDANS STREPTOCOCCUS Note: Gram Stain Report Called to,Read Back By and Verified With: CESE BOKIAGON 01/05/14 0657A [place] Performed at [facility]    Report Status 01/07/2014 FINAL  Final   Organism ID, Bacteria VIRIDANS STREPTOCOCCUS  Final      Susceptibility   Viridans streptococcus - MIC (ETEST)*    PENICILLIN .094 SENSITIVE Sensitive     * VIRIDANS STREPTOCOCCUS  Urine culture     Status: None   Collection Time: 01/04/14  2:10 PM  Result Value Ref Range Status   Specimen Description URINE, RANDOM  Final   Special Requests NONE  Final   Culture  Setup Time   Final    01/04/2014 21:44 Performed at [place]   Final    >=100,000 COLONIES/ML Performed at [facility]    Culture   Final    ENTEROCOCCUS SPECIES Performed at [facility]    Report Status 01/07/2014 FINAL  Final   Organism ID, Bacteria ENTEROCOCCUS SPECIES  Final      Susceptibility   Enterococcus species - MIC*    AMPICILLIN <=2 SENSITIVE Sensitive     LEVOFLOXACIN >=8 RESISTANT Resistant     NITROFURANTOIN <=16 SENSITIVE Sensitive     VANCOMYCIN 1 SENSITIVE Sensitive     TETRACYCLINE >=16 RESISTANT Resistant     * ENTEROCOCCUS SPECIES  Blood Culture (routine x 2)     Status: None   Collection Time: 01/04/14  2:15 PM  Result Value Ref Range Status   Specimen Description BLOOD LEFT ANTECUBITAL  Final   Special Requests BOTTLES DRAWN AEROBIC  AND ANAEROBIC 5CC  Final   Culture  Setup Time   Final    01/04/2014 17:59 Performed at [facility]    Culture   Final    VIRIDANS STREPTOCOCCUS Note: SUSCEPTIBILITIES PERFORMED ON PREVIOUS CULTURE WITHIN THE LAST 5 DAYS. Note: Gram Stain Report Called to,Read Back By and Verified With: Virgilio Frees @ [phone] ON [phone] BY [facility] Performed at [facility]    Report Status 01/07/2014 FINAL  Final  Culture, blood (routine x 2)     Status: None  (Preliminary result)   Collection Time: 01/06/14  5:00 PM  Result Value Ref Range Status   Specimen Description BLOOD RIGHT HAND  Final   Special Requests BOTTLES DRAWN AEROBIC ONLY [place]  Final   Culture  Setup Time   Final    01/07/2014 00:40 Performed at [facility]    Culture   Final           BLOOD CULTURE RECEIVED NO GROWTH TO DATE CULTURE WILL BE HELD FOR 5 DAYS BEFORE ISSUING A FINAL NEGATIVE REPORT Performed at [facility]    Report Status PENDING  Incomplete     Labs: Basic Metabolic Panel:  Recent Labs Lab 01/04/14 1405 01/04/14 2102 01/05/14 0525 01/06/14 0354 01/08/14 0510  NA 137  --  143 136* 136*  K 3.9  --  4.5 3.8 4.1  CL 96  --  106 96 96  CO2 26  --  _0 GLUCOSE 285*  --  233* 237* 211*  BUN 18  --  _1 CREATININE 1.17*  --  1.09 1.03 1.10  CALCIUM 11.5*  --  10.5 10.8* 11.0*  MG  --  1.9  --   --   --   PHOS  --  2.7  --   --   --    Liver Function Tests:  Recent Labs Lab 01/04/14 1405 01/05/14 0525 01/06/14 0354 01/08/14 0510  AST 45* 35 34 29  ALT 35 _2 ALKPHOS 124* 107 111 109  BILITOT 0.4 0.3 0.4 0.3  PROT 6.9 6.2 6.5 6.6  ALBUMIN 3.3* 2.7* 3.0* 3.1*   No results for input(s): LIPASE, AMYLASE in the last 168 hours. No results for input(s): AMMONIA in the last 168 hours. CBC:  Recent Labs Lab 01/04/14 1405 01/05/14 0525 01/06/14 0354 01/07/14 0500 01/08/14 0510  WBC 14.3* 12.1* 11.8* 10.8* 9.6  NEUTROABS 7.9* 5.8 5.4 4.8 4.1  HGB 9.7* 9.2* 9.9* 10.3* 10.0*  HCT 30.8* 29.2* 31.0* 33.3* 32.2*  MCV 90.3 93.9 92.5 92.5 94.7  PLT 387 378 379 379 361   Cardiac Enzymes: No results for input(s): CKTOTAL, CKMB, CKMBINDEX, TROPONINI in the last 168 hours. BNP: BNP (last 3 results)  Recent Labs  07/12/13 2232 07/16/13 0650 09/02/13 1803  PROBNP 4136.0* 933.6* 558.0*   CBG:  Recent Labs Lab 01/09/14 0726 01/09/14 1200 01/09/14 1742 01/09/14 1958 01/10/14 0738  GLUCAP 256* 229*  227* 258* 254*       Signed:  Nita Sells  [facility] 01/10/2014, 11:26 AM

## 2014-01-10 NOTE — Progress Notes (Signed)
Discharge instructions and medications discussed with patient.  All questions answered.  

## 2014-01-10 NOTE — Care Management Note (Signed)
CARE MANAGEMENT NOTE 01/10/2014  Patient:  LIESEL, PECKENPAUGH   Account Number:  [id number]  Date Initiated:  01/10/2014  Documentation initiated by:  Shemaiah Round  Subjective/Objective Assessment:   CM following for progression and d/c planning.     Action/Plan:   01/10/2014 Met with pt who selected [facility] for HH need. [facility] notified and they use [facility] for IV antibiotics.   Anticipated DC Date:  01/10/2014   Anticipated DC Plan:  [place]         [facility] Choice  HOME HEALTH   Choice offered to / List presented to:  C-1 Patient        [place] arranged  HH-1 RN  [place]   Status of service:  Completed, signed off Medicare Important Message given?  YES (If response is "NO", the following Medicare IM given date fields will be blank) Date Medicare IM given:  01/10/2014 Medicare IM given by:  Evoleth Nordmeyer Date Additional Medicare IM given:   Additional Medicare IM given by:    Discharge Disposition:  [place]  Per UR Regulation:    If discussed at Long Length of Stay Meetings, dates discussed:    Comments:  01/10/2014 All info faxed to [facility] and to [facility] for IV antibiotics to be adm in the home. Jasmine Pang RN MPH, case manager, [phone]

## 2014-01-11 NOTE — Telephone Encounter (Signed)
Spoke with the patient. She says she is not at home, but will call to schedule the appointment as soon as she is. Aware that the appointment will be with an extender.

## 2014-01-12 ENCOUNTER — Ambulatory Visit: Payer: Medicare Other | Admitting: Family Medicine

## 2014-01-13 LAB — CULTURE, BLOOD (ROUTINE X 2): CULTURE: NO GROWTH

## 2014-01-19 ENCOUNTER — Telehealth: Payer: Self-pay | Admitting: Family Medicine

## 2014-01-19 NOTE — Telephone Encounter (Signed)
[facility] home [facility] wants orders for physical therapy, 2 X /  wk for 6 weeks,  working on strength and balance. Verbal is ok. pls leave message on number.

## 2014-01-19 NOTE — Telephone Encounter (Signed)
Can you look in chart to see who started pt on this?

## 2014-01-19 NOTE — Telephone Encounter (Signed)
I spoke Mickel Baas from [place] home [facility] and she will follow up the the below doctor regarding the Vancomycin.

## 2014-01-19 NOTE — Telephone Encounter (Signed)
We do not dose this, most like it is infectious disease. Can you look into this?

## 2014-01-19 NOTE — Telephone Encounter (Signed)
Her antibiotics in the hospital were managed by Dr. [name] (Infectious Disease) and he started the Vancomycin. I would defer orders regarding the Vancomycin dosing to Dr. Johnnye Sima

## 2014-01-19 NOTE — Telephone Encounter (Signed)
Patients Vanc trough is 14.5 and her creat is 1.17 and BUN is 18. Please call to OK dose for the week. They need the OK by 3PM.

## 2014-01-19 NOTE — Telephone Encounter (Signed)
Okay per [name] to give verbal order for the below request. I did speak with Mickel Baas at [facility]  [phone] and gave the verbal order.

## 2014-01-19 NOTE — Telephone Encounter (Signed)
Called [facility] and advised that Dr Sarajane Jews does not do dosing for her Vanc. She asked if he would agree to have Pharmacy to dose?

## 2014-01-25 NOTE — Telephone Encounter (Signed)
error 

## 2014-01-26 ENCOUNTER — Telehealth: Payer: Self-pay | Admitting: Family Medicine

## 2014-01-26 ENCOUNTER — Emergency Department (HOSPITAL_COMMUNITY): Payer: Medicare Other

## 2014-01-26 ENCOUNTER — Encounter (HOSPITAL_COMMUNITY): Payer: Self-pay | Admitting: Adult Health

## 2014-01-26 ENCOUNTER — Emergency Department (HOSPITAL_COMMUNITY)
Admission: EM | Admit: 2014-01-26 | Discharge: 2014-01-27 | Disposition: A | Discharge status: Home / Self Care | Payer: Medicare Other | Attending: Emergency Medicine | Admitting: Emergency Medicine

## 2014-01-26 DIAGNOSIS — K219 Gastro-esophageal reflux disease without esophagitis: Secondary | ICD-10-CM | POA: Diagnosis not present

## 2014-01-26 DIAGNOSIS — E785 Hyperlipidemia, unspecified: Secondary | ICD-10-CM | POA: Insufficient documentation

## 2014-01-26 DIAGNOSIS — G43909 Migraine, unspecified, not intractable, without status migrainosus: Secondary | ICD-10-CM | POA: Diagnosis not present

## 2014-01-26 DIAGNOSIS — E119 Type 2 diabetes mellitus without complications: Secondary | ICD-10-CM | POA: Insufficient documentation

## 2014-01-26 DIAGNOSIS — Z9981 Dependence on supplemental oxygen: Secondary | ICD-10-CM | POA: Diagnosis not present

## 2014-01-26 DIAGNOSIS — G8929 Other chronic pain: Secondary | ICD-10-CM | POA: Insufficient documentation

## 2014-01-26 DIAGNOSIS — I498 Other specified cardiac arrhythmias: Secondary | ICD-10-CM | POA: Insufficient documentation

## 2014-01-26 DIAGNOSIS — Z8541 Personal history of malignant neoplasm of cervix uteri: Secondary | ICD-10-CM | POA: Diagnosis not present

## 2014-01-26 DIAGNOSIS — R109 Unspecified abdominal pain: Secondary | ICD-10-CM | POA: Insufficient documentation

## 2014-01-26 DIAGNOSIS — Z86010 Personal history of colonic polyps: Secondary | ICD-10-CM | POA: Insufficient documentation

## 2014-01-26 DIAGNOSIS — J449 Chronic obstructive pulmonary disease, unspecified: Secondary | ICD-10-CM | POA: Diagnosis not present

## 2014-01-26 DIAGNOSIS — Z87891 Personal history of nicotine dependence: Secondary | ICD-10-CM | POA: Insufficient documentation

## 2014-01-26 DIAGNOSIS — R011 Cardiac murmur, unspecified: Secondary | ICD-10-CM | POA: Insufficient documentation

## 2014-01-26 DIAGNOSIS — Z880 Allergy status to penicillin: Secondary | ICD-10-CM | POA: Insufficient documentation

## 2014-01-26 DIAGNOSIS — I509 Heart failure, unspecified: Secondary | ICD-10-CM | POA: Diagnosis not present

## 2014-01-26 DIAGNOSIS — F329 Major depressive disorder, single episode, unspecified: Secondary | ICD-10-CM | POA: Insufficient documentation

## 2014-01-26 DIAGNOSIS — Z85118 Personal history of other malignant neoplasm of bronchus and lung: Secondary | ICD-10-CM | POA: Diagnosis not present

## 2014-01-26 DIAGNOSIS — Z8701 Personal history of pneumonia (recurrent): Secondary | ICD-10-CM | POA: Insufficient documentation

## 2014-01-26 DIAGNOSIS — M1990 Unspecified osteoarthritis, unspecified site: Secondary | ICD-10-CM | POA: Diagnosis not present

## 2014-01-26 DIAGNOSIS — F419 Anxiety disorder, unspecified: Secondary | ICD-10-CM | POA: Insufficient documentation

## 2014-01-26 DIAGNOSIS — I10 Essential (primary) hypertension: Secondary | ICD-10-CM | POA: Diagnosis not present

## 2014-01-26 LAB — COMPREHENSIVE METABOLIC PANEL
ALT: 43 U/L — AB (ref 0–35)
AST: 59 U/L — ABNORMAL HIGH (ref 0–37)
Albumin: 3.8 g/dL (ref 3.5–5.2)
Alkaline Phosphatase: 121 U/L — ABNORMAL HIGH (ref 39–117)
Anion gap: 10 (ref 5–15)
BUN: 25 mg/dL — ABNORMAL HIGH (ref 6–23)
CALCIUM: 11.4 mg/dL — AB (ref 8.4–10.5)
CHLORIDE: 102 meq/L (ref 96–112)
CO2: 27 mmol/L (ref 19–32)
CREATININE: 1.21 mg/dL — AB (ref 0.50–1.10)
GFR, EST AFRICAN AMERICAN: 54 mL/min — AB (ref >90)
GFR, EST NON AFRICAN AMERICAN: 47 mL/min — AB (ref >90)
GLUCOSE: 144 mg/dL — AB (ref 70–99)
Potassium: 3.8 mmol/L (ref 3.5–5.1)
Sodium: 139 mmol/L (ref 135–145)
Total Bilirubin: 0.5 mg/dL (ref 0.3–1.2)
Total Protein: 7.4 g/dL (ref 6.0–8.3)

## 2014-01-26 LAB — CBC WITH DIFFERENTIAL/PLATELET
BASOS ABS: 0.2 10*3/uL — AB (ref 0.0–0.1)
Basophils Relative: 1 % (ref 0–1)
EOS PCT: 12 % — AB (ref 0–5)
Eosinophils Absolute: 1.5 10*3/uL — ABNORMAL HIGH (ref 0.0–0.7)
HEMATOCRIT: 34.9 % — AB (ref 36.0–46.0)
HEMOGLOBIN: 11.3 g/dL — AB (ref 12.0–15.0)
LYMPHS ABS: 2.5 10*3/uL (ref 0.7–4.0)
LYMPHS PCT: 20 % (ref 12–46)
MCH: 29.9 pg (ref 26.0–34.0)
MCHC: 32.4 g/dL (ref 30.0–36.0)
MCV: 92.3 fL (ref 78.0–100.0)
MONO ABS: 2.1 10*3/uL — AB (ref 0.1–1.0)
MONOS PCT: 17 % — AB (ref 3–12)
NEUTROS ABS: 6.2 10*3/uL (ref 1.7–7.7)
Neutrophils Relative %: 50 % (ref 43–77)
Platelets: 365 10*3/uL (ref 150–400)
RBC: 3.78 MIL/uL — AB (ref 3.87–5.11)
RDW: 22.7 % — AB (ref 11.5–15.5)
WBC: 12.4 10*3/uL — AB (ref 4.0–10.5)

## 2014-01-26 LAB — I-STAT TROPONIN, ED: Troponin i, poc: 0 ng/mL (ref 0.00–0.08)

## 2014-01-26 LAB — LIPASE, BLOOD: LIPASE: 48 U/L (ref 11–59)

## 2014-01-26 MED ORDER — SODIUM CHLORIDE 0.9 % IV BOLUS (SEPSIS)
1000.0000 mL | Freq: Once | INTRAVENOUS | Status: AC
  Administered 2014-01-26: 1000 mL via INTRAVENOUS

## 2014-01-26 MED ORDER — SODIUM CHLORIDE 0.9 % IV BOLUS (SEPSIS)
500.0000 mL | Freq: Once | INTRAVENOUS | Status: AC
  Administered 2014-01-26: 500 mL via INTRAVENOUS

## 2014-01-26 MED ORDER — HYDROMORPHONE HCL 1 MG/ML IJ SOLN
1.0000 mg | Freq: Once | INTRAMUSCULAR | Status: AC
  Administered 2014-01-26: 1 mg via INTRAVENOUS
  Filled 2014-01-26: qty 1

## 2014-01-26 MED ORDER — ONDANSETRON HCL 4 MG/2ML IJ SOLN
4.0000 mg | Freq: Once | INTRAMUSCULAR | Status: AC
  Administered 2014-01-26: 4 mg via INTRAVENOUS
  Filled 2014-01-26: qty 2

## 2014-01-26 MED ORDER — IOHEXOL 300 MG/ML  SOLN
25.0000 mL | Freq: Once | INTRAMUSCULAR | Status: AC | PRN
  Administered 2014-01-26: 25 mL via ORAL

## 2014-01-26 MED ORDER — IOHEXOL 300 MG/ML  SOLN
100.0000 mL | Freq: Once | INTRAMUSCULAR | Status: AC | PRN
  Administered 2014-01-26: 100 mL via INTRAVENOUS

## 2014-01-26 NOTE — ED Notes (Signed)
PICC line patent per IV team, OK to use.

## 2014-01-26 NOTE — Telephone Encounter (Signed)
I spoke with pt and she is on the way now to [facility] ER. She spoke with a nurse from [facility] ( [facility] ) and was advised to go to the emergency room. Pt does not feel well and can hardly drink anything. I was calling pt to let her know about lab results and to see how she was feeling. I did give this information back to [name] and he is aware of pt's current status.

## 2014-01-26 NOTE — Telephone Encounter (Signed)
Spoke with a pharmacist that was dosing her Vanc and they had that dosing was to tem on 01/06 or 01/12 and they wanted clarification. I advised that they were to follow up with Dr Johnnye Sima, the ID that wrote the original rx.

## 2014-01-26 NOTE — Telephone Encounter (Signed)
Looks like a FYI 

## 2014-01-26 NOTE — ED Notes (Signed)
Pt would like to wait to have blood drawn-unable to pull from PICC due to PICC n ot flushing

## 2014-01-26 NOTE — ED Notes (Addendum)
Presents with upper abdominal pain, nausea, dizziness and increased SOB. Pain radiates into chest, began this AM. Pt is receiving IV antibiotics through a PICC line for "I have a blood infection"  O2 dependent at 2 liters. Endorses low grade fever at home. Pain is described as sharp.  Abdominal pain began three years ago and is associated with bleeding in ostomy bag.

## 2014-01-26 NOTE — ED Notes (Signed)
CT called and informed pt has finished her contrast.

## 2014-01-26 NOTE — Telephone Encounter (Signed)
Can you help [name] with this?

## 2014-01-26 NOTE — ED Notes (Signed)
Phlebotomy at the bedside  

## 2014-01-26 NOTE — Telephone Encounter (Signed)
Her recent labs show her creatinine has bumped to 1.36 and her potassium is 6.0. Please call her and ask how she feels. Is she drinking fluids? Is she vomiting? She looks to be dehydrated

## 2014-01-27 ENCOUNTER — Encounter: Payer: Self-pay | Admitting: Family Medicine

## 2014-01-27 DIAGNOSIS — R109 Unspecified abdominal pain: Secondary | ICD-10-CM | POA: Diagnosis not present

## 2014-01-27 MED ORDER — OXYCODONE HCL 5 MG PO TABS: 5.0000 mg | ORAL_TABLET | ORAL | Status: DC | PRN

## 2014-01-27 MED ORDER — HEPARIN SOD (PORK) LOCK FLUSH 100 UNIT/ML IV SOLN
500.0000 [IU] | Freq: Once | INTRAVENOUS | Status: AC
  Administered 2014-01-27: 500 [IU]
  Filled 2014-01-27: qty 5

## 2014-01-27 MED ORDER — PROMETHAZINE HCL 25 MG PO TABS: 25.0000 mg | ORAL_TABLET | Freq: Three times a day (TID) | ORAL | Status: DC | PRN

## 2014-01-27 MED ORDER — HYDROMORPHONE HCL 1 MG/ML IJ SOLN
1.0000 mg | Freq: Once | INTRAMUSCULAR | Status: AC
  Administered 2014-01-27: 1 mg via INTRAVENOUS
  Filled 2014-01-27: qty 1

## 2014-01-27 NOTE — ED Provider Notes (Signed)
CSN: [id number]     Arrival date & time 01/26/14  1701 History   First MD Initiated Contact with Patient 01/26/14 1919     Chief Complaint  Patient presents with  . Abdominal Pain     (Consider location/radiation/quality/duration/timing/severity/associated sxs/prior Treatment) HPI Patient presents to the emergency department with abdominal pain that is chronic for the patient.  Patient states that she has been dealing with this abdominal pain for many years.  She states that tonight it seemed worse and that is what brought her to the emergency department.  She states that her pain is diffuse in nature.  Patient states that nothing seems make her condition better, but palpation makes the pain worse.  The patient denies chest pain, shortness of breath, dysuria, weakness, dizziness, headache, blurred vision, back pain, neck pain, cough, runny nose, sore throat, fever, rash, diarrhea, nausea, vomiting, hematemesis, bloody stool or syncope.  The patient states she has bloody stools chronically that are intermittent, but they have been unable to locate a source Past Medical History  Diagnosis Date  . Cirrhosis   . GERD (gastroesophageal reflux disease)   . Cervical disc syndrome     trouble turnng neck at times  . Chronic respiratory failure   . Diverticulitis of colon   . Perforation of colon   . IBS (irritable bowel syndrome)   . Chronic lower GI bleeding   . Overactive bladder   . Thrombocytopenia     sees Dr. Julien Nordmann   . Splenomegaly   . Depression   . Hypertension   . Asthma   . Heart murmur   . Hyperlipidemia   . COPD (chronic obstructive pulmonary disease)     sees Dr. Gwenette Greet   . Colostomy care   . Hypercalcemia   . CHF (congestive heart failure)   . Pacemaker 12/14/2013  . 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 Cervical cancer many years ago  . Complication of anesthesia     woke up during colonscopy and endoscopy in past  . History of blood  transfusion "several"    "bleeding via ostomy" (01/04/2014)  . On home oxygen therapy     "2L; 24/7" (01/04/2014)  . Pneumonia "1-2 times"  . Chronic bronchitis "several times"  . Sleep apnea 2012    mild, no cpap needed  . Type II diabetes mellitus     sees Dr. Cruzita Lederer   . Chronic disease anemia     sees Dr. Julien Nordmann, due to chronic disease and GI losses   . History of stomach ulcers   . Migraine     "last one was several years ago" (01/04/2014)  . Arthritis     "tailbone; hands; legs" (01/04/2014)  . Anxiety   . Pericardial effusion 2007, 2015.   [name] Chronic abdominal pain     IBS   Past Surgical History  Procedure Laterality Date  . Colostomy  11/06/2005  . Lung removal, partial Left 2004    upper lobe removed  . Left colectomy  11/06/2005    Hartmann resection of sigmoid colon and end colostomy.  . Esophagogastroduodenoscopy  03/19/2011    Procedure: ESOPHAGOGASTRODUODENOSCOPY (EGD);  Surgeon: [name], MD;  Location: [facility];  Service: Endoscopy;  Laterality: N/A;  . Colonoscopy  03/19/2011    Procedure: COLONOSCOPY;  Surgeon: [name], MD;  Location: [facility];  Service: Endoscopy;  Laterality: N/A;  . Givens capsule study  03/20/2011    Procedure: GIVENS  CAPSULE STUDY;  Surgeon: [name], MD;  Location: [facility];  Service: Endoscopy;  Laterality: N/A;  . Small bowel obstruction repair  March 2012  . Umbilical hernia repair  March 2012  . Splenectomy, total N/A 02/24/2013    Procedure: SPLENECTOMY;  Surgeon: Harl Bowie, MD;  Location: [place];  Service: General;  Laterality: N/A;  . Orif patella Left 04/21/2013    Procedure: OPEN REDUCTION INTERNAL (ORIF) FIXATION PATELLA;  Surgeon: Augustin Schooling, MD;  Location: [place];  Service: Orthopedics;  Laterality: Left;  . Pacemaker insertion  2015  . Esophagogastroduodenoscopy (egd) with propofol N/A 11/02/2013    Procedure: ESOPHAGOGASTRODUODENOSCOPY (EGD) WITH PROPOFOL;  Surgeon: [name],  MD;  Location: [facility];  Service: Endoscopy;  Laterality: N/A;  EGD with APC  . Hot hemostasis N/A 11/02/2013    Procedure: HOT HEMOSTASIS (ARGON PLASMA COAGULATION/BICAP);  Surgeon: [name], MD;  Location: [facility];  Service: Endoscopy;  Laterality: N/A;  . Colon surgery    . Hernia repair    . Fracture surgery    . Abdominal hysterectomy    . Tubal ligation    . Tonsillectomy  1959  . Appendectomy  1962  . Cholecystectomy    . Cardiac catheterization  1967  . Permanent pacemaker insertion N/A 09/07/2013    Procedure: PERMANENT PACEMAKER INSERTION;  Surgeon: Evans Lance, MD;  Location[facility] C[facility] LAB;  Service: Cardiovascular;  Laterality: N/A;  . Tee without cardioversion N/A 01/06/2014    Procedure: TRANSESOPHAGEAL ECHOCARDIOGRAM (TEE);  Surgeon: Lelon Perla, MD;  Location: [facility] ENDOSCOPY;  Service: Cardiovascular;  Laterality: N/A;   Family History  Problem Relation Age of Onset  . Coronary artery disease    . Diabetes type II    . Anesthesia problems Neg Hx   . Hypotension Neg Hx   . Malignant hyperthermia Neg Hx   . Pseudochol deficiency Neg Hx   . Heart attack Mother   . Diabetes type II Mother   . Cirrhosis Father    History  Substance Use Topics  . Smoking status: Former Smoker -- 2.00 packs/day for 35 years    Types: Cigarettes    Quit date: 03/24/2002  . Smokeless tobacco: Never Used  . Alcohol Use: No   OB History    No data available     Review of Systems  All other systems negative except as documented in the HPI. All pertinent positives and negatives as reviewed in the HPI.  Allergies  Acetaminophen; Morphine; Morphine and related; Other; Penicillins; Trazodone and nefazodone; Codeine phosphate; Hydrocodone-acetaminophen; Cephalexin; and Hydrocodone-acetaminophen  Home Medications   Prior to Admission medications   Medication Sig Start Date End Date Taking? Authorizing Provider  albuterol-ipratropium (COMBIVENT) 18-103 MCG/ACT  inhaler Inhale 2 puffs into the lungs every 4 (four) hours as needed for wheezing or shortness of breath.    Yes Historical Provider, MD  atorvastatin (LIPITOR) 20 MG tablet Take 20 mg by mouth every morning.   Yes Historical Provider, MD  diazepam (VALIUM) 5 MG tablet Take 1 tablet (5 mg total) by mouth 3 (three) times daily as needed for anxiety. 07/29/13  Yes [name], MD  dicyclomine (BENTYL) 10 MG capsule Take 10 mg by mouth 3 (three) times daily.    Yes Historical Provider, MD  esomeprazole (NEXIUM) 40 MG capsule Take 40 mg by mouth every evening.   Yes Historical Provider, MD  Fluticasone-Salmeterol (ADVAIR) 250-50 MCG/DOSE AEPB Inhale 1 puff into the lungs  every 12 (twelve) hours. 04/12/13  Yes [name], MD  folic acid (FOLVITE) 1 MG tablet Take 1 tablet (1 mg total) by mouth every evening. 04/12/13  Yes [name], MD  furosemide (LASIX) 40 MG tablet Take 2 tablets (80 mg total) by mouth 2 (two) times daily. 10/05/13  Yes [name], MD  hydrALAZINE (APRESOLINE) 25 MG tablet Take 1.5 tablets (37.5 mg total) by mouth 2 (two) times daily. 10/13/13  Yes [name], MD  insulin regular human CONCENTRATED (HUMULIN R) 500 UNIT/ML SOLN injection Inject under skin 0.30 mL before b'fast, 0.25 mL before lunch and 0.22 mL before dinner Patient taking differently: Inject 26-30 Units into the skin 3 (three) times daily with meals. 30 units at breakfast, 26 units at lunch, 26 units at supper 12/28/13  Yes Philemon Kingdom, MD  iron polysaccharides (FERREX 150) 150 MG capsule Take 1 capsule (150 mg total) by mouth 2 (two) times daily. 10/07/13  Yes [name], MD  isosorbide dinitrate (ISORDIL) 20 MG tablet Take 1 tablet (20 mg total) by mouth 2 (two) times daily. 10/13/13  Yes [name], MD  losartan (COZAAR) 50 MG tablet Take 50 mg by mouth every morning.   Yes Historical Provider, MD  metFORMIN (GLUCOPHAGE) 500 MG tablet Take 1 tablet (500 mg total) by mouth 2 (two) times daily with a  meal. 12/28/13  Yes Philemon Kingdom, MD  metoCLOPramide (REGLAN) 10 MG tablet Take 1 tablet (10 mg total) by mouth 3 (three) times daily. 04/12/13  Yes [name], MD  Oxycodone HCl 20 MG TABS Take 1 tablet (20 mg total) by mouth every 3 (three) hours as needed (pain). 10/13/13  Yes [name], MD  OXYGEN Inhale 2 L into the lungs continuous.   Yes Historical Provider, MD  promethazine (PHENERGAN) 25 MG tablet Take 1 tablet (25 mg total) by mouth every 4 (four) hours as needed for nausea or vomiting. 11/23/13  Yes [name], MD  vancomycin (VANCOCIN) 1 GM/200ML SOLN Inject 200 mLs (1,000 mg total) into the vein daily. 01/10/14  Yes Nita Sells, MD  INSULIN SYRINGE 1CC/29G (B-D INSULIN SYRINGE) 29G X 1/2" 1 ML MISC Use to inject insulin 3 times daily. 11/23/13   Philemon Kingdom, MD  lactulose, encephalopathy, (CHRONULAC) 10 GM/15ML SOLN Take 15 mLs (10 g total) by mouth 2 (two) times daily as needed. have at least one to 2 loose stools daily 01/10/14   Nita Sells, MD  methocarbamol (ROBAXIN) 500 MG tablet Take 1 tablet (500 mg total) by mouth every 6 (six) hours as needed for muscle spasms. 05/03/13   Ivan Anchors Love, PA-C  polyethylene glycol (MIRALAX / GLYCOLAX) packet Take 17 g by mouth daily. 01/10/14   Nita Sells, MD   BP 159/51 mmHg  Pulse 73  Temp(Src) 98.1 F (36.7 C) (Oral)  Resp 14  SpO2 99% Physical Exam  Constitutional: She is oriented to person, place, and time. She appears well-developed and well-nourished. No distress.  HENT:  Head: Normocephalic and atraumatic.  Mouth/Throat: Oropharynx is clear and moist.  Eyes: Pupils are equal, round, and reactive to light.  Neck: Normal range of motion. Neck supple.  Cardiovascular: Normal rate, regular rhythm and normal heart sounds.  Exam reveals no gallop and no friction rub.   No murmur heard. Pulmonary/Chest: Effort normal and breath sounds normal. No respiratory distress.  Abdominal: Soft. Bowel  sounds are normal. She exhibits no distension. There is tenderness. There is  no rebound and no guarding.  Neurological: She is alert and oriented to person, place, and time. She exhibits normal muscle tone. Coordination normal.  Skin: Skin is warm and dry. No rash noted. No erythema.  Nursing note and vitals reviewed.   ED Course  Procedures (including critical care time) Labs Review Labs Reviewed  CBC WITH DIFFERENTIAL - Abnormal; Notable for the following:    WBC 12.4 (*)    RBC 3.78 (*)    Hemoglobin 11.3 (*)    HCT 34.9 (*)    RDW 22.7 (*)    Monocytes Relative 17 (*)    Monocytes Absolute 2.1 (*)    Eosinophils Relative 12 (*)    Eosinophils Absolute 1.5 (*)    Basophils Absolute 0.2 (*)    All other components within normal limits  COMPREHENSIVE METABOLIC PANEL - Abnormal; Notable for the following:    Glucose, Bld 144 (*)    BUN 25 (*)    Creatinine, Ser 1.21 (*)    Calcium 11.4 (*)    AST 59 (*)    ALT 43 (*)    Alkaline Phosphatase 121 (*)    GFR calc non Af Amer 47 (*)    GFR calc Af Amer 54 (*)    All other components within normal limits  LIPASE, BLOOD  I-STAT TROPOININ, ED    Imaging Review Ct Abdomen Pelvis W Contrast  01/27/2014   CLINICAL DATA:  Abdominal pain  EXAM: CT ABDOMEN AND PELVIS WITH CONTRAST  TECHNIQUE: Multidetector CT imaging of the abdomen and pelvis was performed using the standard protocol following bolus administration of intravenous contrast.  CONTRAST:  121mL OMNIPAQUE IOHEXOL 300 MG/ML  SOLN  COMPARISON:  01/04/2014  FINDINGS: BODY WALL: Peristomal hernia in the left lower quadrant containing nonobstructed small bowel.  LOWER CHEST: Unremarkable.  ABDOMEN/PELVIS:  Liver: Liver cirrhosis without focal abnormality. Portal vein is patent.  Biliary: Cholecystectomy.  No ductal enlargement.  Pancreas: 8 mm cystic structure in the superficial pancreatic body which appears benign. There is an additional cyst within the uncinate process measuring 1  cm.  Spleen: Splenectomy with residual splenule.  Adrenals: Unremarkable.  Kidneys and ureters: No hydronephrosis or stone.  Bladder: Unremarkable.  Reproductive: 2 cm cyst in the right adnexae which is stable from prior. Hysterectomy.  Bowel: Descending colostomy. No bowel obstruction or inflammatory wall thickening.  Retroperitoneum: Stable prominence of preaortic nodes at the level of the SMA.  Peritoneum: No ascites or pneumoperitoneum.  Vascular: No acute abnormality.  OSSEOUS: No acute abnormalities.  IMPRESSION: 1. No acute findings to explain abdominal pain. 2. Cirrhosis. 3. Left lower quadrant parastomal hernia. 4. Stable benign-appearing pancreatic cystic lesions. 5. 2 cm right adnexal cyst.   Electronically Signed   By: Jorje Guild M.D.   On: 01/27/2014 00:12     EKG Interpretation None      MDM   Final diagnoses:  None   Patient be referred back to her primary care Dr. told to return here as needed.  She is given pain control and antiemetics.  The patient is advised to return here as needed.  I feel that this patient's pain is related to her chronic abdominal issues   Brent General, PA-C 01/27/14 3662  Ernestina Patches, MD 01/29/14 [phone]

## 2014-01-27 NOTE — ED Notes (Signed)
Report to [place], rn. Pt care transferred

## 2014-01-27 NOTE — Discharge Instructions (Signed)
Return here as needed.  Your scans did not show any abnormality.  Follow-up with your primary care doctor

## 2014-02-03 ENCOUNTER — Other Ambulatory Visit (HOSPITAL_BASED_OUTPATIENT_CLINIC_OR_DEPARTMENT_OTHER): Payer: Medicare Other

## 2014-02-03 ENCOUNTER — Other Ambulatory Visit: Payer: Self-pay | Admitting: Family Medicine

## 2014-02-03 DIAGNOSIS — D500 Iron deficiency anemia secondary to blood loss (chronic): Secondary | ICD-10-CM

## 2014-02-03 LAB — CBC WITH DIFFERENTIAL/PLATELET
BASO%: 1 % (ref 0.0–2.0)
BASOS ABS: 0.2 10*3/uL — AB (ref 0.0–0.1)
EOS ABS: 1.6 10*3/uL — AB (ref 0.0–0.5)
EOS%: 10.4 % — ABNORMAL HIGH (ref 0.0–7.0)
HEMATOCRIT: 35.9 % (ref 34.8–46.6)
HEMOGLOBIN: 11.4 g/dL — AB (ref 11.6–15.9)
LYMPH%: 22.8 % (ref 14.0–49.7)
MCH: 29.5 pg (ref 25.1–34.0)
MCHC: 31.8 g/dL (ref 31.5–36.0)
MCV: 92.8 fL (ref 79.5–101.0)
MONO#: 2.5 10*3/uL — ABNORMAL HIGH (ref 0.1–0.9)
MONO%: 16 % — AB (ref 0.0–14.0)
NEUT%: 49.8 % (ref 38.4–76.8)
NEUTROS ABS: 7.7 10*3/uL — AB (ref 1.5–6.5)
NRBC: 0 % (ref 0–0)
Platelets: 307 10*3/uL (ref 145–400)
RBC: 3.87 10*6/uL (ref 3.70–5.45)
RDW: 22.1 % — ABNORMAL HIGH (ref 11.2–14.5)
WBC: 15.4 10*3/uL — AB (ref 3.9–10.3)
lymph#: 3.5 10*3/uL — ABNORMAL HIGH (ref 0.9–3.3)

## 2014-02-03 LAB — IRON AND TIBC CHCC
%SAT: 14 % — ABNORMAL LOW (ref 21–57)
Iron: 47 ug/dL (ref 41–142)
TIBC: 340 ug/dL (ref 236–444)
UIBC: 293 ug/dL (ref 120–384)

## 2014-02-03 LAB — FERRITIN CHCC: Ferritin: 94 ng/ml (ref 9–269)

## 2014-02-03 LAB — TECHNOLOGIST REVIEW

## 2014-02-08 ENCOUNTER — Other Ambulatory Visit: Payer: Self-pay | Admitting: Family Medicine

## 2014-02-08 NOTE — Telephone Encounter (Signed)
Call in Valium #[phone] with 5 rf, also Lasix #[phone] with 11 rf

## 2014-02-09 ENCOUNTER — Ambulatory Visit (INDEPENDENT_AMBULATORY_CARE_PROVIDER_SITE_OTHER): Payer: Medicare Other | Admitting: Pulmonary Disease

## 2014-02-09 ENCOUNTER — Encounter: Payer: Self-pay | Admitting: Pulmonary Disease

## 2014-02-09 ENCOUNTER — Telehealth: Payer: Self-pay | Admitting: Family Medicine

## 2014-02-09 VITALS — BP 130/72 | HR 63 | Temp 98.7°F | Ht 60.0 in | Wt 207.2 lb

## 2014-02-09 DIAGNOSIS — R7881 Bacteremia: Secondary | ICD-10-CM

## 2014-02-09 DIAGNOSIS — E119 Type 2 diabetes mellitus without complications: Secondary | ICD-10-CM

## 2014-02-09 DIAGNOSIS — B954 Other streptococcus as the cause of diseases classified elsewhere: Secondary | ICD-10-CM

## 2014-02-09 DIAGNOSIS — J438 Other emphysema: Secondary | ICD-10-CM

## 2014-02-09 DIAGNOSIS — I5032 Chronic diastolic (congestive) heart failure: Secondary | ICD-10-CM

## 2014-02-09 DIAGNOSIS — J9611 Chronic respiratory failure with hypoxia: Secondary | ICD-10-CM

## 2014-02-09 DIAGNOSIS — N390 Urinary tract infection, site not specified: Secondary | ICD-10-CM

## 2014-02-09 NOTE — Assessment & Plan Note (Signed)
Secondary to chronic diastolic heart failure and underlying cirrhosis.

## 2014-02-09 NOTE — Patient Instructions (Signed)
No change in your breathing meds for you mild copd. Work on [employer] loss Keep upcoming apptm with your cardiologist.  You have a lot of edema, and your weight is up 20 pounds from our last visit. followup with me again in one year

## 2014-02-09 NOTE — Telephone Encounter (Signed)
Pt called to ask for referral to see Infection Disease Doctor

## 2014-02-09 NOTE — Assessment & Plan Note (Signed)
The patient has minimal COPD by her pulmonary function studies in the past, and has clear lung fields on exam today. Advair is probably a little bit of overkill for her degree of obstruction, but will continue on this for now given all of her other issues. I have stressed to her the importance of aggressive weight loss, working on conditioning, and keeping her fluid balance controlled.

## 2014-02-09 NOTE — Progress Notes (Signed)
   Subjective:    Patient ID: [name], female    DOB: [birth year], 64 y.o.   MRN: [id number]  HPI The patient comes in today for follow-up of her mild COPD, and also chronic respiratory failure secondary to chronic diastolic heart failure and cirrhosis. She has been in the hospital recently with bacteremia, and required IV antibiotics at home for 5 weeks. She feels like her breathing is a little bit worse than her usual baseline, but she has gained 20 pounds since the last visit. Also noted significant worsening of her lower extremity edema. She has a recent upcoming appointment with cardiology for her increased fluid issues. She has a mild cough with minimal sputum production at this time. She has been staying on Advair.   Review of Systems  Constitutional: Negative for fever and unexpected weight change.  HENT: Positive for congestion. Negative for dental problem, ear pain, nosebleeds, postnasal drip, rhinorrhea, sinus pressure, sneezing, sore throat and trouble swallowing.   Eyes: Negative for redness and itching.  Respiratory: Positive for cough, chest tightness, shortness of breath and wheezing.   Cardiovascular: Positive for leg swelling. Negative for palpitations.  Gastrointestinal: Negative for nausea and vomiting.  Genitourinary: Negative for dysuria.  Musculoskeletal: Negative for joint swelling.  Skin: Negative for rash.  Neurological: Negative for headaches.  Hematological: Does not bruise/bleed easily.  Psychiatric/Behavioral: Negative for dysphoric mood. The patient is not nervous/anxious.        Objective:   Physical Exam Morbidly obese female in no acute distress Nose without purulence or discharge noted Neck without lymphadenopathy or thyromegaly Chest totally clear to auscultation Cardiac exam with regular rate and rhythm Lower extremities with 3+ edema, no cyanosis Alert and oriented, moves all 4 extremities.       Assessment & Plan:

## 2014-02-10 ENCOUNTER — Encounter: Payer: Self-pay | Admitting: Family Medicine

## 2014-02-10 ENCOUNTER — Ambulatory Visit: Payer: Medicare Other | Admitting: Internal Medicine

## 2014-02-10 ENCOUNTER — Ambulatory Visit (HOSPITAL_BASED_OUTPATIENT_CLINIC_OR_DEPARTMENT_OTHER): Payer: Medicare Other | Admitting: Physician Assistant

## 2014-02-10 ENCOUNTER — Telehealth: Payer: Self-pay | Admitting: Physician Assistant

## 2014-02-10 ENCOUNTER — Encounter: Payer: Self-pay | Admitting: Physician Assistant

## 2014-02-10 VITALS — BP 157/56 | HR 65 | Temp 98.1°F | Resp 20 | Ht 60.0 in | Wt 204.1 lb

## 2014-02-10 DIAGNOSIS — D638 Anemia in other chronic diseases classified elsewhere: Secondary | ICD-10-CM

## 2014-02-10 DIAGNOSIS — D509 Iron deficiency anemia, unspecified: Secondary | ICD-10-CM

## 2014-02-10 DIAGNOSIS — Z85118 Personal history of other malignant neoplasm of bronchus and lung: Secondary | ICD-10-CM

## 2014-02-10 DIAGNOSIS — N390 Urinary tract infection, site not specified: Secondary | ICD-10-CM

## 2014-02-10 NOTE — Progress Notes (Addendum)
[place] [phone]   [phone]  OFFICE PROGRESS NOTE  [name], MD [place] [address]  DIAGNOSIS:  1) stage IB non-small cell lung cancer diagnosed in April 2004 status post left upper lobectomy by Dr. Roxan Hockey.  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  3) anemia of chronic disease plus/minus deficiency secondary to GI bleed.  4) hypercalcemia most likely secondary to excessive vitamin D intake.  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 Ninfa Linden.  CURRENT THERAPY:  Feraheme infusion 510 mg IV weekly x 2 doses. First dose today 12/16/2013.  INTERVAL HISTORY: [name] 64 y.o. female returns to the clinic today for followup visit. She reports that she was discharged from the hospital around 01/04/2014 and is been on home IV antibiotics for approximately 5 weeks. She was discharged with a PICC line and states that her daughter has been flushing the PICC line for her. She received her last dose of IV antibiotics on 02/08/2014. The antibiotics have been furnished through [facility] home health. She is a bit frustrated as it is her understanding that she needs to see an infectious disease specialist to see if she will need further home IV antibiotics or whether the PICC line can be of hold. I have advised that she is to have her primary care physician refer her to infectious disease so that these determinations can be made. She continues to have some level of fatigue but overall does feel a bit better. She is scheduled to see gastroenterology on 03/01/2014 and her cardiologist on 03/02/2014. She did receive IV iron in November 2015 for her anemia.  She denied having any other significant complaints except for the fatigue. She denied and specifically no nausea or vomiting, no fever or chills. She denied having any significant weight loss or night sweats. She has  no chest pain but continues to have shortness of breath with exertion. She has no other bleeding, bruises or ecchymosis. She had repeat CBC and iron study performed recently and she is here for evaluation and discussion of her lab results.    MEDICAL HISTORY: Past Medical History  Diagnosis Date  . Cirrhosis   . GERD (gastroesophageal reflux disease)   . Cervical disc syndrome     trouble turnng neck at times  . Chronic respiratory failure   . Diverticulitis of colon   . Perforation of colon   . IBS (irritable bowel syndrome)   . Chronic lower GI bleeding   . Overactive bladder   . Thrombocytopenia     sees Dr. Julien Nordmann   . Splenomegaly   . Depression   . Hypertension   . Asthma   . Heart murmur   . Hyperlipidemia   . COPD (chronic obstructive pulmonary disease)     sees Dr. Gwenette Greet   . Colostomy care   . Hypercalcemia   . CHF (congestive heart failure)   . Pacemaker 12/14/2013  . 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 Cervical cancer many years ago  . Complication of anesthesia     woke up during colonscopy and endoscopy in past  . History of blood transfusion "several"    "bleeding via ostomy" (01/04/2014)  . On home oxygen therapy     "2L; 24/7" (01/04/2014)  . Pneumonia "1-2 times"  . Chronic bronchitis "several times"  . Sleep apnea 2012    mild,  no cpap needed  . Type II diabetes mellitus     sees Dr. Cruzita Lederer   . Chronic disease anemia     sees Dr. Julien Nordmann, due to chronic disease and GI losses   . History of stomach ulcers   . Migraine     "last one was several years ago" (01/04/2014)  . Arthritis     "tailbone; hands; legs" (01/04/2014)  . Anxiety   . Pericardial effusion 2007, 2015.   [name] Chronic abdominal pain     IBS    ALLERGIES:  is allergic to acetaminophen; morphine; morphine and related; other; penicillins; trazodone and nefazodone; codeine phosphate; hydrocodone-acetaminophen; cephalexin; and  hydrocodone-acetaminophen.  MEDICATIONS:  Current Outpatient Prescriptions  Medication Sig Dispense Refill  . albuterol-ipratropium (COMBIVENT) 18-103 MCG/ACT inhaler Inhale 2 puffs into the lungs every 4 (four) hours as needed for wheezing or shortness of breath.     [name] atorvastatin (LIPITOR) 20 MG tablet Take 20 mg by mouth every morning.    . diazepam (VALIUM) 5 MG tablet TAKE ONE TABLET BY MOUTH THREE TIMES DAILY AS NEEDED 90 tablet 5  . dicyclomine (BENTYL) 10 MG capsule Take 10 mg by mouth 3 (three) times daily.     [name] esomeprazole (NEXIUM) 40 MG capsule Take 40 mg by mouth every evening.    . Fluticasone-Salmeterol (ADVAIR) 250-50 MCG/DOSE AEPB Inhale 1 puff into the lungs every 12 (twelve) hours. 616 each 3  . folic acid (FOLVITE) 1 MG tablet Take 1 tablet (1 mg total) by mouth every evening. 90 tablet 3  . furosemide (LASIX) 40 MG tablet TAKE TWO TABLETS BY MOUTH TWICE DAILY 120 tablet 11  . hydrALAZINE (APRESOLINE) 25 MG tablet Take 1.5 tablets (37.5 mg total) by mouth 2 (two) times daily. 270 tablet 3  . insulin regular human CONCENTRATED (HUMULIN R) 500 UNIT/ML SOLN injection Inject under skin 0.30 mL before b'fast, 0.25 mL before lunch and 0.22 mL before dinner (Patient taking differently: Inject 26-30 Units into the skin 3 (three) times daily with meals. 30 units at breakfast, 26 units at lunch, 26 units at supper) 20 mL 3  . INSULIN SYRINGE 1CC/29G (B-D INSULIN SYRINGE) 29G X 1/2" 1 ML MISC Use to inject insulin 3 times daily. 100 each 3  . iron polysaccharides (FERREX 150) 150 MG capsule Take 1 capsule (150 mg total) by mouth 2 (two) times daily. 60 capsule 3  . isosorbide dinitrate (ISORDIL) 20 MG tablet Take 1 tablet (20 mg total) by mouth 2 (two) times daily. 180 tablet 3  . lactulose, encephalopathy, (CHRONULAC) 10 GM/15ML SOLN Take 15 mLs (10 g total) by mouth 2 (two) times daily as needed. have at least one to 2 loose stools daily 2700 mL 0  . losartan (COZAAR) 50 MG tablet  Take 50 mg by mouth every morning.    . metFORMIN (GLUCOPHAGE) 500 MG tablet Take 1 tablet (500 mg total) by mouth 2 (two) times daily with a meal. 90 tablet 1  . methocarbamol (ROBAXIN) 500 MG tablet Take 1 tablet (500 mg total) by mouth every 6 (six) hours as needed for muscle spasms. 60 tablet 0  . metoCLOPramide (REGLAN) 10 MG tablet Take 1 tablet (10 mg total) by mouth 3 (three) times daily. 270 tablet 3  . Oxycodone HCl 20 MG TABS Take 1 tablet (20 mg total) by mouth every 3 (three) hours as needed (pain). 240 tablet 0  . OXYGEN Inhale 2 L into the lungs continuous.    . polyethylene glycol (  MIRALAX / GLYCOLAX) packet Take 17 g by mouth daily. 14 each 0  . promethazine (PHENERGAN) 25 MG tablet Take 1 tablet (25 mg total) by mouth every 4 (four) hours as needed for nausea or vomiting. 120 tablet 2   No current facility-administered medications for this visit.    SURGICAL HISTORY:  Past Surgical History  Procedure Laterality Date  . Colostomy  11/06/2005  . Lung removal, partial Left 2004    upper lobe removed  . Left colectomy  11/06/2005    Hartmann resection of sigmoid colon and end colostomy.  . Esophagogastroduodenoscopy  03/19/2011    Procedure: ESOPHAGOGASTRODUODENOSCOPY (EGD);  Surgeon: [name], MD;  Location: [facility];  Service: Endoscopy;  Laterality: N/A;  . Colonoscopy  03/19/2011    Procedure: COLONOSCOPY;  Surgeon: [name], MD;  Location: [facility];  Service: Endoscopy;  Laterality: N/A;  . Givens capsule study  03/20/2011    Procedure: GIVENS CAPSULE STUDY;  Surgeon: [name], MD;  Location: [facility] ENDOS[facility];  Service: Endoscopy;  Laterality: N/A;  . Small bowel obstruction repair  March 2012  . Umbilical hernia repair  March 2012  . Splenectomy, total N/A 02/24/2013    Procedure: SPLENECTOMY;  Surgeon: Harl Bowie, MD;  Location: [facility];  Service: General;  Laterality: N/A;  . Orif patella Left 04/21/2013    Procedure: OPEN REDUCTION  INTERNAL (ORIF) FIXATION PATELLA;  Surgeon: Augustin Schooling, MD;  Location: [facility];  Service: Orthopedics;  Laterality: Left;  . Pacemaker insertion  2015  . Esophagogastroduodenoscopy (egd) with propofol N/A 11/02/2013    Procedure: ESOPHAGOGASTRODUODENOSCOPY (EGD) WITH PROPOFOL;  Surgeon: [name], MD;  Location: [facility];  Service: Endoscopy;  Laterality: N/A;  EGD with APC  . Hot hemostasis N/A 11/02/2013    Procedure: HOT HEMOSTASIS (ARGON PLASMA COAGULATION/BICAP);  Surgeon: [name], MD;  Location: [facility];  Service: Endoscopy;  Laterality: N/A;  . Colon surgery    . Hernia repair    . Fracture surgery    . Abdominal hysterectomy    . Tubal ligation    . Tonsillectomy  1959  . Appendectomy  1962  . Cholecystectomy    . Cardiac catheterization  1967  . Permanent pacemaker insertion N/A 09/07/2013    Procedure: PERMANENT PACEMAKER INSERTION;  Surgeon: Evans Lance, MD;  Location: [facility] CATH LAB;  Service: Cardiovascular;  Laterality: N/A;  . Tee without cardioversion N/A 01/06/2014    Procedure: TRANSESOPHAGEAL ECHOCARDIOGRAM (TEE);  Surgeon: Lelon Perla, MD;  Location: [facility] ENDOSCOPY;  Service: Cardiovascular;  Laterality: N/A;    REVIEW OF SYSTEMS:  Constitutional: positive for fatigue Eyes: negative Ears, nose, mouth, throat, and face: negative Respiratory: positive for dyspnea on exertion Cardiovascular: negative Gastrointestinal: positive for Bleeding from the ostomy site. Genitourinary:negative Integument/breast: negative Hematologic/lymphatic: Anemia  Musculoskeletal:negative Neurological: negative Behavioral/Psych: negative Allergic/Immunologic: negative   PHYSICAL EXAMINATION: General appearance: alert, cooperative, fatigued and no distress Head: Normocephalic, without obvious abnormality, atraumatic Neck: no adenopathy and no JVD Lymph nodes: Cervical, supraclavicular, and axillary nodes normal. Resp: clear to auscultation  bilaterally Cardio: regular rate and rhythm, S1, S2 normal, no murmur, click, rub or gallop GI: soft, non-tender; bowel sounds normal; no masses,  no organomegaly Extremities: extremities normal, atraumatic, no cyanosis or edema and PICC line right upper extremity, nontender, no evidence of infection Neurologic: Alert and oriented X 3, normal strength and tone. Normal symmetric reflexes. Normal coordination and gait  ECOG PERFORMANCE STATUS: 2 - Symptomatic, <50% confined  to bed  Blood pressure 157/56, pulse 65, temperature 98.1 F (36.7 C), temperature source Oral, resp. rate 20, height 5' (1.524 m), weight 204 lb 1.6 oz (92.579 kg), SpO2 99 %.  LABORATORY DATA: Lab Results  Component Value Date   WBC 15.4* 02/03/2014   HGB 11.4* 02/03/2014   HCT 35.9 02/03/2014   MCV 92.8 02/03/2014   PLT 307 02/03/2014      Chemistry      Component Value Date/Time   NA 139 01/26/2014 1935   NA 138 06/23/2013 0806   K 3.8 01/26/2014 1935   K 4.7 06/23/2013 0806   CL 102 01/26/2014 1935   CL 103 03/23/2012 0938   CO2 27 01/26/2014 1935   CO2 21* 06/23/2013 0806   BUN 25* 01/26/2014 1935   BUN 21.7 06/23/2013 0806   CREATININE 1.21* 01/26/2014 1935   CREATININE 1.1 06/23/2013 0806      Component Value Date/Time   CALCIUM 11.4* 01/26/2014 1935   CALCIUM 10.3 06/23/2013 0806   ALKPHOS 121* 01/26/2014 1935   ALKPHOS 138 06/23/2013 0806   AST 59* 01/26/2014 1935   AST 43* 06/23/2013 0806   ALT 43* 01/26/2014 1935   ALT 49 06/23/2013 0806   BILITOT 0.5 01/26/2014 1935   BILITOT 0.31 06/23/2013 0806       RADIOGRAPHIC STUDIES: BONE MARROW REPORT ASSESSMENT AND PLAN:  1) severe iron deficiency anemia secondary to gastrointestinal blood loss from the ostomy. The patient is currently on oral iron tablets but it is not effective. I will arrange for the patient to have Feraheme infusion 510 mg weekly x2 doses. Last given in November 2015. Hemoglobin today is 11.4 with serum iron of 4713  of 94 with a percent saturation of 14%. Patient was discussed with and also seen by Dr. Julien Nordmann. No further IV iron is needed at this time. She is advised to follow-up with the gastroenterologist as scheduled.   2) Thrombocytopenia: Resolved   3) History of stage IB non-small cell lung cancer diagnosed in 2004. Continue on observation.  The patient was further advised to contact her primary care physician for referral to infectious disease regarding any further need for IV antibiotics and the removal of her PICC line. She is also advised to follow-up with her cardiologist as scheduled  She was advised to call immediately if she has any concerning symptoms in the interval. The patient voices understanding of current disease status and treatment options and is in agreement with the current care plan.  All questions were answered. The patient knows to call the clinic with any problems, questions or concerns. We can certainly see the patient much sooner if necessary.  Carlton Adam, PA-C 02/10/2014  ADDENDUM: Hematology/Oncology Attending: I had a face to face encounter with the patient today. I recommended her care plan. This is a very pleasant 64 years old white female currently under evaluation for persistent anemia of chronic disease plus/minus iron deficiency secondary to GI bleed. She was recently treated with Feraheme infusion in November 2015. She had significant improvement in her hemoglobin and hematocrit as well as the iron study. Unfortunately the patient was recently diagnosed with urinary tract infection requiring hospitalization followed by home intravenous antibiotics. She completed her course of antibiotics recently and she still have the PICC line. She contacted her primary care physician for advice about the next step in her condition and he recommended for her to see infectious disease for evaluation but no appointment has been established yet. She was  a little bit very upset  and tearful at time with the current situation about her PICC line and urinary tract infection. I discussed the lab result with the patient today and recommended for her to continue on observation with repeat CBC and iron study in 3 months. Regarding the urinary tract infection, I strongly recommended for her to reach out again to [name] for evaluation or referral to infectious disease regarding the duration of intravenous iron infusion requirement for removal of the PICC line. She was advised to call immediately if she has any other concerning symptoms in the interval.  Disclaimer: This note was dictated with voice recognition software. Similar sounding words can inadvertently be transcribed and may not be corrected upon review. Eilleen Kempf., MD 02/10/2014

## 2014-02-10 NOTE — Telephone Encounter (Signed)
I called pt back and left a voice message about the referral

## 2014-02-10 NOTE — Patient Instructions (Signed)
Your iron studies have improved and are stable Follow up in 3 months with repeat iron studies Contact your primary physician for a referral to infectious disease regarding your PICC line and any further home IV antibiotics

## 2014-02-10 NOTE — Telephone Encounter (Signed)
I tried to reach pt by phone and it was a bad connection.

## 2014-02-10 NOTE — Telephone Encounter (Signed)
Gave avs & cal for April. °

## 2014-02-10 NOTE — Telephone Encounter (Signed)
Referral was done  

## 2014-02-15 ENCOUNTER — Telehealth: Payer: Self-pay | Admitting: Family Medicine

## 2014-02-15 ENCOUNTER — Telehealth: Payer: Self-pay | Admitting: Licensed Clinical Social Worker

## 2014-02-15 NOTE — Telephone Encounter (Signed)
Patient states she  has been without antibiotics for almost 2 weeks, she states that [facility] needs an order to pull picc or she will have to pay out of pocket for supplies to keep the line open. Her appointment is not until 03/03/14 to see Dr. Johnnye Sima. Please advise. Patient also wants to know if Dr. Johnnye Sima will like to see her sooner because she states she is still not feeling well.

## 2014-02-15 NOTE — Telephone Encounter (Signed)
Anderson Malta must call the infectious disease doctor for this order, per [name]. I did call the below number and leave this message.

## 2014-02-15 NOTE — Telephone Encounter (Signed)
She states patient received her last dose of medication on 02/08/14 and Anderson Malta wants to know if [name] wants the PICC line removed.  Ok to leave message on voicemail if needed.

## 2014-02-16 ENCOUNTER — Telehealth: Payer: Self-pay | Admitting: *Deleted

## 2014-02-16 NOTE — Telephone Encounter (Signed)
Please pull PIC, please schedule as soon as possible

## 2014-02-16 NOTE — Telephone Encounter (Signed)
Nurse Raquel Sarna called and advised that the patient was done with her IV medications on 02/08/14 and is not scheduled to see Dr Johnnye Sima until 2/4 57 and she wants to know what he wants to do. D/C PICC or continue to flush until her visit? Advised her will ask the doctor and get back to her asap. She gave [phone] as a call back.

## 2014-02-22 ENCOUNTER — Encounter: Payer: Self-pay | Admitting: Infectious Diseases

## 2014-02-22 ENCOUNTER — Ambulatory Visit (INDEPENDENT_AMBULATORY_CARE_PROVIDER_SITE_OTHER): Payer: Medicare Other | Admitting: Infectious Diseases

## 2014-02-22 ENCOUNTER — Other Ambulatory Visit: Payer: Self-pay | Admitting: Pediatrics

## 2014-02-22 VITALS — BP 181/69 | HR 70 | Temp 97.2°F | Wt 205.0 lb

## 2014-02-22 DIAGNOSIS — I10 Essential (primary) hypertension: Secondary | ICD-10-CM

## 2014-02-22 DIAGNOSIS — R7881 Bacteremia: Secondary | ICD-10-CM

## 2014-02-22 DIAGNOSIS — B954 Other streptococcus as the cause of diseases classified elsewhere: Secondary | ICD-10-CM

## 2014-02-22 DIAGNOSIS — B955 Unspecified streptococcus as the cause of diseases classified elsewhere: Secondary | ICD-10-CM

## 2014-02-22 DIAGNOSIS — M7989 Other specified soft tissue disorders: Secondary | ICD-10-CM

## 2014-02-22 NOTE — Assessment & Plan Note (Signed)
Will check doppler for DVT.

## 2014-02-22 NOTE — Progress Notes (Signed)
Per Dr Johnnye Sima 39 cm  Single lumen Peripherally Inserted Central Catheter  removed from right basilic . Swelling noted in arm about 2 cm below right antecubital. Patient states it is slightly painful. Physician notified.  No sutures present. Dressing was clean and dry . Area cleansed with chlorhexidine and petroleum dressing applied. Pt advised no heavy lifting with this arm, leave dressing for 24 hours and call the office if dressing becomes soaked with blood or sharp pain presents.  Pt tolerated procedure well and has been advised to go for right arm ultrasound to rule out DVT.   Laverle Patter, RN

## 2014-02-22 NOTE — Assessment & Plan Note (Signed)
Needs to be seen by PCP ASAP

## 2014-02-22 NOTE — Progress Notes (Signed)
   Subjective:    Patient ID: [name], female    DOB: Jun 05, 1950, 64 y.o.   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 She was also found to have hypercalcemia. On admission she was afebrile, she had WBC 14.3. She was started on levaquin and flagyl.  Her BCx from admission grew viridans strep. UCx grew enterococcus.  Had TEE that did not show vegetation.  She was d/c home on vancomycin for 1 month, due to hx of allergy to cephalosporin. She was seen in ED 12-31 for abd pain. She completed her anbx on 1-12. I received a call from RN on 1-20 and I asked that her PIC be removed. This has not been done (yet). Pt states home RN was not given RN to do this despite pt calling.  "I'm still not feeling too good". Still having GI pain, bleeding from her ostomy bag. Is making appt to see her GI doctor. States she has had bleeding for the last 3 years.   Has had "low grade fever for last 3 days": 99.4, 99.2 Has had no bleeding or d/c from the [facility] site. Does have some tenderness.  Review of Systems  Constitutional: Negative for fever, chills and appetite change.  Respiratory: Negative for shortness of breath.   Cardiovascular: Positive for leg swelling. Negative for chest pain.  Genitourinary: Negative for dyspareunia.  Neurological: Positive for headaches.       Objective:   Physical Exam  Constitutional: She appears well-developed and well-nourished.  HENT:  Mouth/Throat: No oropharyngeal exudate.  Eyes: EOM are normal. Pupils are equal, round, and reactive to light.  Neck: Neck supple.  Cardiovascular: Normal rate, regular rhythm and normal heart sounds.   Pulmonary/Chest: Effort normal and breath sounds normal.  Abdominal: Soft. Bowel sounds are normal.  She exhibits distension. There is no rebound and no guarding.    Musculoskeletal:       Arms: Lymphadenopathy:    She has no cervical adenopathy.          Assessment & Plan:

## 2014-02-22 NOTE — Assessment & Plan Note (Signed)
Will pull her pic.  Will repeat her BCx today.  She appears to be doing well with the exception to of continued issues with her ostomy. She has GI f/u.  Will check u/s of her RUE to eval for possible DVT.  rtc prn.

## 2014-02-23 ENCOUNTER — Ambulatory Visit
Admission: RE | Admit: 2014-02-23 | Discharge: 2014-02-23 | Disposition: A | Discharge status: Home / Self Care | Payer: Medicare Other | Source: Ambulatory Visit | Attending: Infectious Diseases | Admitting: Infectious Diseases

## 2014-02-23 DIAGNOSIS — M7989 Other specified soft tissue disorders: Secondary | ICD-10-CM

## 2014-02-24 ENCOUNTER — Telehealth: Payer: Self-pay | Admitting: Licensed Clinical Social Worker

## 2014-02-24 ENCOUNTER — Telehealth: Payer: Self-pay | Admitting: Family Medicine

## 2014-02-24 NOTE — Telephone Encounter (Signed)
Patient called for results of her doppler ultrasound from yesterday, negative  results given. Patient states that her arm is still hurting and a little swollen. She states that she has pain medication that she can take, I also advised her to place a warm compress for some relief. She agreed to this plan.

## 2014-02-24 NOTE — Telephone Encounter (Signed)
---  Caller states she is experiencing bleeding in her ostomy bag, going on off and on for 3 years, has had several test done in the past, cause is undetermined. Passing only blood this morning, now blood is mixed with stool, no complaints of distress. Caller is seeking a referral to a GI specialist, informed caller to caller office during regular office hours.  [place] Day - Client [place] [facility] Patient Name: [name] Gender: Female DOB: July 20, 1950 Age: 64 Y 35 D Return Phone Number: [phone] (Primary) Address: [address] City/State/Zip: [place] [address] Client [facility] [place] Day - Client Client Site [place] - Day Physician [place], [place] Type Call Call Type Triage / Clinical Relationship To Patient Self Appointment Disposition EMR Appointment Not Necessary Return Phone Number [phone] (Primary) Chief Complaint Blood In Stool Initial Comment Caller states she is experiencing bleeding in her ostomy bag. Info pasted into Epic No Nurse Assessment Nurse: Burt Ek, RN, Vonna Kotyk Date/Time (Eastern Time): 02/24/2014 5:03:13 PM Confirm and document reason for call. If symptomatic, describe symptoms.   Has the patient traveled out of the country within the last 30 days? ---Not Applicable Does the patient require triage? ---Declined Triage Guidelines Guideline Title Affirmed Question Affirmed Notes Nurse Date/Time ([place] Time) Disp. Time Eilene Ghazi Time) Disposition Final User 02/24/2014 5:01:11 PM Send To Clinical Follow Up Rod Can, RN, Dellis Filbert 02/24/2014 5:13:32 PM Clinical Call Yes Burt Ek, RN, Vonna Kotyk After Care Instructions Given Call Event Type User Date / Time Description

## 2014-02-25 NOTE — Telephone Encounter (Signed)
She already sees Dr. [name] in our GI office. Does she want to switch to a different group?

## 2014-02-28 LAB — CULTURE, BLOOD (SINGLE)
ORGANISM ID, BACTERIA: NO GROWTH
ORGANISM ID, BACTERIA: NO GROWTH

## 2014-02-28 NOTE — Telephone Encounter (Signed)
Pt has tried to get in to see Dr. Deatra Berry, however he is booked until the end of February. Can we check to see if anyone else in the group can see her any sooner?

## 2014-03-01 ENCOUNTER — Telehealth: Payer: Self-pay | Admitting: Internal Medicine

## 2014-03-01 ENCOUNTER — Ambulatory Visit (INDEPENDENT_AMBULATORY_CARE_PROVIDER_SITE_OTHER): Payer: Medicare Other | Admitting: Internal Medicine

## 2014-03-01 DIAGNOSIS — E1140 Type 2 diabetes mellitus with diabetic neuropathy, unspecified: Secondary | ICD-10-CM

## 2014-03-01 DIAGNOSIS — E1165 Type 2 diabetes mellitus with hyperglycemia: Secondary | ICD-10-CM

## 2014-03-01 DIAGNOSIS — IMO0002 Reserved for concepts with insufficient information to code with codable children: Secondary | ICD-10-CM

## 2014-03-01 MED ORDER — "INSULIN SYRINGE 29G X 1/2"" 1 ML MISC": Status: DC

## 2014-03-01 MED ORDER — INSULIN REGULAR HUMAN (CONC) 500 UNIT/ML ~~LOC~~ SOLN: SUBCUTANEOUS | Status: DC

## 2014-03-01 MED ORDER — METFORMIN HCL 500 MG PO TABS: 500.0000 mg | ORAL_TABLET | Freq: Two times a day (BID) | ORAL | Status: DC

## 2014-03-01 NOTE — Patient Instructions (Signed)
Please increase the U500 insulin as follows: - 33 units before breakfast - 33 units before lunch - 30 units before dinner  Please have a fructosamine checked tomorrow at the appoinment with [name].   Please return in 1.5 month with your sugar log.

## 2014-03-01 NOTE — Telephone Encounter (Signed)
Done

## 2014-03-01 NOTE — Progress Notes (Signed)
Patient ID: [name], female   DOB: December 03, 1950, 64 y.o.   MRN: [id number]  HPI: [name] is a 64 y.o.-year-old female, returns for f/u for DM2, dx 2004, insulin-dependent since 2006/7 - then off in 2009 - then restarted 08/2012, uncontrolled, with complications (PN, admission for [facility] on 11/14/2012). Last visit 2 mo ago. Pt was seen sooner than her appt time as she was dropped off much sooner. Since I saw her in lunch break >> no VS was performed and she did not fill the ROS form. Most of the visit was spent in counseling, discussing her current hospitalization and course of DM since last visit, reviewing sugars in her log and adjusting her U500 insulin doses.  She was admitted for sepsis 01/26/2014. She had a PICC line, now off. Her breathing is not well, even on oxygen. She will see Dr Sarajane Jews tomorrow.  Last hemoglobin A1c was: Office Visit on 11/22/2013  Component Date Value Ref Range Status  . Fructosamine - Calculated HbA1c is 7.7%. 11/22/2013 360 190 - 270 umol/L Final   Lab Results  Component Value Date   HGBA1C 6.8 07/29/2013   HGBA1C 6.9 04/22/2013   HGBA1C 8.6 11/14/2012   Pt was on a regimen of: - Lantus 40-30 units - pen  - Humalog 45-35-40 >> 55-45-45 units with B-L-D- pens  Copay $0 for her insulins.  She is now on U500 insulin: - 30-30-30 with B-L-D Also on Metformin 500 mg 2x a day.  Pt checks her sugars 4-6x a day and they are still high: - am: 95-180, but in last week, also 200s and even 310 and 320 >> 123-260, 312 >> 78, 85, 110-212, 291, 458 - 2h after b'fast: 300-499 >> 311-402 - highest sugars of the day >> 275-377 >> 189-200 >> n/c  - before lunch: 247-480 >> 221-407 >> 182-381 >> 190-200 >> 216-350 >> 119-404, 445 >> 284-390, 454 - 2h after lunch: n/c >> 160 x 1 check >> 246- 302 >> n/c  - before dinner: 231-504 >> 83-328 - 130-448 >> 67, 140-150 >> 160-309, 401 >> 119, 273-445 >> 130, 219-362, 447 - bedtime: 280-480 >> 104-435 >> 220-372 >> 104-105 >>  150-330, 416 >> 265-398 >> n/c No lows exc. 1x 78; she has hypoglycemia awareness at 70-80. Highest sugar was 500s.  - + CKD, last BUN/creatinine:  Lab Results  Component Value Date   BUN 25* 01/26/2014   Lab Results  Component Value Date   CREATININE 1.21* 01/26/2014   - last eye exam was in 04/2012. No DR.  - + numbness and tingling in her feet.  She also has a history of Squamous cell lung cancer, hypertension, COPD, GERD, depression, anemia and thrombocytopenia, cirrhosis, IBS. She had a BM Bx  >> no malignancy. She had splenectomy for severe splenomegaly (02/24/2013) >> this was a difficult surgery. Had severe blood loss.   I reviewed pt's medications, allergies, PMH, social hx, family hx and no changes required, except as mentioned above.  ROS: Not discussed  PE: No VS  Wt Readings from Last 3 Encounters:  02/22/14 205 lb (92.987 kg)  02/10/14 204 lb 1.6 oz (92.579 kg)  02/09/14 207 lb 3.2 oz (93.985 kg)   Constitutional: obese, pale, in NAD, on O2 Eyes: PERRLA, EOMI, no exophthalmos ENT: moist mucous membranes, no thyromegaly, no cervical lymphadenopathy Cardiovascular: RRR, No MRG Respiratory: rhonchi throughout pulm fields Gastrointestinal: abdomen soft, NT, ND, BS+ Musculoskeletal: no deformities, strength intact in all 4 Skin: moist,  warm  ASSESSMENT: 1. DM2, insulin-dependent, uncontrolled, with complications - PN - h/o HHNK admission  PLAN:  1. Patient with long-standing, uncontrolled diabetes, on U500 insulin tx. Sugars are still high per review of her log, despite increasing U500 dose. She has higher sugars as the day goes by, with best sugars in am. - I suggested to increase U500 insulin and restart Metformin. Reviewed recent BUN/Cr and LFTs >> improved:   Patient Instructions  Please increase the U500 insulin as follows: - 33 units before breakfast - 33 units before lunch - 30 units before dinner  Please have a fructosamine checked tomorrow at  the appoinment with [name].   Please return in 1.5 month with your sugar log.   - we need to check fructosamine - will order one for tomorrow - advised to get a new eye exam >> she is due - continue checking sugars at different times of the day - check 4 times a day, rotating checks- I gave her new logs - Return to clinic in 1.5 mo with sugar log  - time spent with the patient: 25 min, of which >50% was spent discussing her current hospitalization and course of DM since last visit, reviewing sugars in her log and adjusting her U500 insulin doses.

## 2014-03-01 NOTE — Telephone Encounter (Signed)
Patient called and would like a refill on her metformin and insulin needles   Thank you

## 2014-03-02 ENCOUNTER — Ambulatory Visit (INDEPENDENT_AMBULATORY_CARE_PROVIDER_SITE_OTHER): Payer: Medicare Other | Admitting: Family Medicine

## 2014-03-02 ENCOUNTER — Encounter: Payer: Self-pay | Admitting: Internal Medicine

## 2014-03-02 ENCOUNTER — Ambulatory Visit (INDEPENDENT_AMBULATORY_CARE_PROVIDER_SITE_OTHER): Payer: Medicare Other | Admitting: Internal Medicine

## 2014-03-02 ENCOUNTER — Encounter: Payer: Self-pay | Admitting: Family Medicine

## 2014-03-02 VITALS — BP 164/62 | HR 80 | Ht 60.0 in | Wt 205.0 lb

## 2014-03-02 VITALS — BP 174/57 | HR 64 | Temp 99.1°F | Ht 60.0 in | Wt 205.0 lb

## 2014-03-02 DIAGNOSIS — IMO0002 Reserved for concepts with insufficient information to code with codable children: Secondary | ICD-10-CM

## 2014-03-02 DIAGNOSIS — R001 Bradycardia, unspecified: Secondary | ICD-10-CM

## 2014-03-02 DIAGNOSIS — E1140 Type 2 diabetes mellitus with diabetic neuropathy, unspecified: Secondary | ICD-10-CM

## 2014-03-02 DIAGNOSIS — I10 Essential (primary) hypertension: Secondary | ICD-10-CM

## 2014-03-02 DIAGNOSIS — R071 Chest pain on breathing: Secondary | ICD-10-CM

## 2014-03-02 DIAGNOSIS — Z950 Presence of cardiac pacemaker: Secondary | ICD-10-CM

## 2014-03-02 DIAGNOSIS — J439 Emphysema, unspecified: Secondary | ICD-10-CM

## 2014-03-02 DIAGNOSIS — E1165 Type 2 diabetes mellitus with hyperglycemia: Secondary | ICD-10-CM

## 2014-03-02 DIAGNOSIS — I5032 Chronic diastolic (congestive) heart failure: Secondary | ICD-10-CM

## 2014-03-02 DIAGNOSIS — J209 Acute bronchitis, unspecified: Secondary | ICD-10-CM

## 2014-03-02 LAB — MDC_IDC_ENUM_SESS_TYPE_INCLINIC
Battery Remaining Longevity: 126 mo
Battery Voltage: 3.02 V
Brady Statistic RA Percent Paced: 68 %
Brady Statistic RV Percent Paced: 0.5 %
Date Time Interrogation Session: 20160203170025
Implantable Pulse Generator Model: 2240
Implantable Pulse Generator Serial Number: 7648211
Lead Channel Pacing Threshold Amplitude: 0.5 V
Lead Channel Pacing Threshold Amplitude: 0.75 V
Lead Channel Pacing Threshold Pulse Width: 0.4 ms
Lead Channel Pacing Threshold Pulse Width: 0.4 ms
Lead Channel Pacing Threshold Pulse Width: 0.4 ms
Lead Channel Setting Pacing Amplitude: 2.5 V
Lead Channel Setting Pacing Pulse Width: 0.4 ms
Lead Channel Setting Sensing Sensitivity: 2 mV
MDC IDC MSMT LEADCHNL RA IMPEDANCE VALUE: 487.5 Ohm
MDC IDC MSMT LEADCHNL RA PACING THRESHOLD AMPLITUDE: 0.5 V
MDC IDC MSMT LEADCHNL RA PACING THRESHOLD PULSEWIDTH: 0.4 ms
MDC IDC MSMT LEADCHNL RA SENSING INTR AMPL: 4.8 mV
MDC IDC MSMT LEADCHNL RV IMPEDANCE VALUE: 437.5 Ohm
MDC IDC MSMT LEADCHNL RV PACING THRESHOLD AMPLITUDE: 0.75 V
MDC IDC MSMT LEADCHNL RV SENSING INTR AMPL: 12 mV
MDC IDC SET LEADCHNL RA PACING AMPLITUDE: 2 V

## 2014-03-02 MED ORDER — AZITHROMYCIN 250 MG PO TABS: ORAL_TABLET | ORAL | Status: DC

## 2014-03-02 MED ORDER — ATORVASTATIN CALCIUM 20 MG PO TABS: 20.0000 mg | ORAL_TABLET | Freq: Every morning | ORAL | Status: DC

## 2014-03-02 MED ORDER — DIAZEPAM 5 MG PO TABS: 5.0000 mg | ORAL_TABLET | Freq: Three times a day (TID) | ORAL | Status: DC | PRN

## 2014-03-02 MED ORDER — IPRATROPIUM-ALBUTEROL 0.5-2.5 (3) MG/3ML IN SOLN
3.0000 mL | Freq: Once | RESPIRATORY_TRACT | Status: AC
  Administered 2014-03-02: 3 mL via RESPIRATORY_TRACT

## 2014-03-02 MED ORDER — HYDRALAZINE HCL 25 MG PO TABS: 37.5000 mg | ORAL_TABLET | Freq: Two times a day (BID) | ORAL | Status: DC

## 2014-03-02 MED ORDER — ESOMEPRAZOLE MAGNESIUM 40 MG PO CPDR: 40.0000 mg | DELAYED_RELEASE_CAPSULE | Freq: Every evening | ORAL | Status: DC

## 2014-03-02 MED ORDER — FLUCONAZOLE 150 MG PO TABS: 150.0000 mg | ORAL_TABLET | Freq: Once | ORAL | Status: DC

## 2014-03-02 MED ORDER — FUROSEMIDE 40 MG PO TABS: 80.0000 mg | ORAL_TABLET | Freq: Two times a day (BID) | ORAL | Status: DC

## 2014-03-02 MED ORDER — OXYCODONE HCL 20 MG PO TABS: 20.0000 mg | ORAL_TABLET | ORAL | Status: DC | PRN

## 2014-03-02 MED ORDER — FLUTICASONE-SALMETEROL 250-50 MCG/DOSE IN AEPB: 1.0000 | INHALATION_SPRAY | Freq: Two times a day (BID) | RESPIRATORY_TRACT | Status: AC

## 2014-03-02 MED ORDER — ALBUTEROL SULFATE (2.5 MG/3ML) 0.083% IN NEBU: 2.5000 mg | INHALATION_SOLUTION | RESPIRATORY_TRACT | Status: AC | PRN

## 2014-03-02 MED ORDER — LOSARTAN POTASSIUM 50 MG PO TABS: 50.0000 mg | ORAL_TABLET | Freq: Every morning | ORAL | Status: DC

## 2014-03-02 MED ORDER — LACTULOSE ENCEPHALOPATHY 10 GM/15ML PO SOLN: 10.0000 g | Freq: Two times a day (BID) | ORAL | Status: DC | PRN

## 2014-03-02 MED ORDER — METOLAZONE 2.5 MG PO TABS: 2.5000 mg | ORAL_TABLET | Freq: Every day | ORAL | Status: DC

## 2014-03-02 MED ORDER — POLYSACCHARIDE IRON COMPLEX 150 MG PO CAPS: 150.0000 mg | ORAL_CAPSULE | Freq: Two times a day (BID) | ORAL | Status: DC

## 2014-03-02 NOTE — Patient Instructions (Signed)
Your physician has recommended you make the following change in your medication:  1) Start metolazone 2.5 mg one tablet by mouth once daily  Your physician recommends that you return for lab work- Monday 03/07/14- BMP  Your physician recommends that you schedule a follow-up appointment: Tuesday 03/08/14 with the NP/ PA  Remote monitoring is used to monitor your Pacemaker of ICD from home. This monitoring reduces the number of office visits required to check your device to one time per year. It allows [place] to keep an eye on the functioning of your device to ensure it is working properly. You are scheduled for a device check from home on 06/01/14. You may send your transmission at any time that day. If you have a wireless device, the transmission will be sent automatically. After your physician reviews your transmission, you will receive a postcard with your next transmission date.  Your physician wants you to follow-up in: 9 months with Dr. Lovena Le. You will receive a reminder letter in the mail two months in advance. If you don't receive a letter, please call our office to schedule the follow-up appointment.

## 2014-03-02 NOTE — Progress Notes (Signed)
   Subjective:    Patient ID: Loistine Simas, female    DOB: 06-22-1950, 64 y.o.   MRN: [id number]  HPI Here for 2 days of chest congestion, SOB, fever, and coughing up green sputum. Wearing her usual oxygen at 2 liters. She saw Dr. Cruzita Lederer yesterday and she has a pacer check with Dr. Lovena Le later today.    Review of Systems  Constitutional: Positive for fever.  Respiratory: Positive for cough, chest tightness, shortness of breath and wheezing.   Cardiovascular: Positive for leg swelling. Negative for chest pain and palpitations.       Objective:   Physical Exam  Constitutional: She appears well-developed and well-nourished.  Very SOB with audible wheezes   Cardiovascular: Normal rate, regular rhythm, normal heart sounds and intact distal pulses.   Pulmonary/Chest: She has no rales.  Airflow is reduced and she has diffuse wheezes and rhonchi           Assessment & Plan:  She has a bronchitis with a flare of COPD. Given a nebulization treatment and this was very helpful to her. We will arrange for her to have her own nebulizer at home. Add a Zpack.

## 2014-03-02 NOTE — Assessment & Plan Note (Signed)
Her [employer] dual-chamber pacemaker is working normally. We'll recheck in several months.

## 2014-03-02 NOTE — Assessment & Plan Note (Signed)
Her symptoms have worsened. I have disussed the treatment options with the patient and her daughter. One option is admit for IV lasix. A second option is to add metolazone 2.5 mg 30 minutes before her morning lasix. Will have her return in 4 days for labs and 5 days to see one of our PA/NP's. If she does not improve, I have instructed her to go to the ER for IV diuresis.

## 2014-03-02 NOTE — Addendum Note (Signed)
Addended by: Aggie Hacker A on: 03/02/2014 12:48 PM   Modules accepted: Orders

## 2014-03-02 NOTE — Assessment & Plan Note (Signed)
The patient states that her blood sugars have been high. I've encouraged her to reduce her carbohydrate intake, and follow-up with her primary physician.

## 2014-03-02 NOTE — Assessment & Plan Note (Signed)
Currently, her chest pain is mostly pleuritic. Hopefully it will improve as her bronchitis and fluid overload improve.

## 2014-03-02 NOTE — Progress Notes (Signed)
Pre visit review using our clinic review tool, if applicable. No additional management support is needed unless otherwise documented below in the visit note. 

## 2014-03-02 NOTE — Assessment & Plan Note (Signed)
Blood pressure is elevated. Hopefully with fluid reduction, we can improve the blood pressure. She is instructed to maintain a low-sodium diet.

## 2014-03-02 NOTE — Progress Notes (Signed)
HPI [name] returns today for follow-up. She is a very pleasant 64 year old woman with a history of symptomatic sinus node dysfunction, status post permanent pacemaker insertion, also with hypertension. The patient has had worsening bronchitis. She was discharged from the hospital 2 months ago and initially did well but has begun acquiring peripheral edema and swelling and worsening Sob. No fever or chills. Allergies  Allergen Reactions  . Acetaminophen Other (See Comments)    Cirrhosis of liver  . Morphine Other (See Comments)    REACTION: Lowers BP  . Morphine And Related Other (See Comments)    Blood pressure drops   . Other Other (See Comments)    AGENT:  Per pt, CANNOT TAKE ANY FORM OF BLOOD THINNER, due to cirrhosis of the liver  . Penicillins Anaphylaxis and Rash  . Trazodone And Nefazodone Other (See Comments)    Cardiac arrythmia - DO NOT USE  . Codeine Phosphate Other (See Comments)    REACTION: Stomach cramps  . Hydrocodone-Acetaminophen Other (See Comments)    REACTION: hallucinations  . Cephalexin Swelling and Rash  . Hydrocodone-Acetaminophen Other (See Comments)    unknown     Current Outpatient Prescriptions  Medication Sig Dispense Refill  . albuterol (PROVENTIL) (2.5 MG/3ML) 0.083% nebulizer solution Take 3 mLs (2.5 mg total) by nebulization every 4 (four) hours as needed for wheezing or shortness of breath. 75 mL 12  . albuterol-ipratropium (COMBIVENT) 18-103 MCG/ACT inhaler Inhale 2 puffs into the lungs every 4 (four) hours as needed for wheezing or shortness of breath.     [name] atorvastatin (LIPITOR) 20 MG tablet Take 1 tablet (20 mg total) by mouth every morning. 90 tablet 3  . azithromycin (ZITHROMAX) 250 MG tablet As directed 6 tablet 0  . diazepam (VALIUM) 5 MG tablet Take 1 tablet (5 mg total) by mouth 3 (three) times daily as needed. 90 tablet 5  . dicyclomine (BENTYL) 10 MG capsule Take 10 mg by mouth 3 (three) times daily.     [name] esomeprazole  (NEXIUM) 40 MG capsule Take 1 capsule (40 mg total) by mouth every evening. 90 capsule 3  . fluconazole (DIFLUCAN) 150 MG tablet Take 1 tablet (150 mg total) by mouth once. 1 tablet 11  . Fluticasone-Salmeterol (ADVAIR) 250-50 MCG/DOSE AEPB Inhale 1 puff into the lungs every 12 (twelve) hours. 798 each 3  . folic acid (FOLVITE) 1 MG tablet Take 1 tablet (1 mg total) by mouth every evening. 90 tablet 3  . furosemide (LASIX) 40 MG tablet Take 2 tablets (80 mg total) by mouth 2 (two) times daily. 180 tablet 3  . hydrALAZINE (APRESOLINE) 25 MG tablet Take 1.5 tablets (37.5 mg total) by mouth 2 (two) times daily. 270 tablet 3  . insulin regular human CONCENTRATED (HUMULIN R) 500 UNIT/ML SOLN injection Inject under skin 0.33 mL before b'fast, 0.33 mL before lunch and 0.30 mL before dinner 40 mL 2  . INSULIN SYRINGE 1CC/29G (B-D INSULIN SYRINGE) 29G X 1/2" 1 ML MISC Use to inject insulin 3 times daily. 100 each 3  . iron polysaccharides (FERREX 150) 150 MG capsule Take 1 capsule (150 mg total) by mouth 2 (two) times daily. 180 capsule 3  . isosorbide dinitrate (ISORDIL) 20 MG tablet Take 1 tablet (20 mg total) by mouth 2 (two) times daily. 180 tablet 3  . lactulose, encephalopathy, (CHRONULAC) 10 GM/15ML SOLN Take 15 mLs (10 g total) by mouth 2 (two) times daily as needed. have at least one  to 2 loose stools daily 2700 mL 3  . losartan (COZAAR) 50 MG tablet Take 1 tablet (50 mg total) by mouth every morning. 90 tablet 3  . metFORMIN (GLUCOPHAGE) 500 MG tablet Take 1 tablet (500 mg total) by mouth 2 (two) times daily with a meal. 60 tablet 2  . methocarbamol (ROBAXIN) 500 MG tablet Take 1 tablet (500 mg total) by mouth every 6 (six) hours as needed for muscle spasms. 60 tablet 0  . metoCLOPramide (REGLAN) 10 MG tablet Take 1 tablet (10 mg total) by mouth 3 (three) times daily. 270 tablet 3  . Oxycodone HCl 20 MG TABS Take 1 tablet (20 mg total) by mouth every 3 (three) hours as needed (pain). 240 tablet 0  .  OXYGEN Inhale 2 L into the lungs continuous.    . polyethylene glycol (MIRALAX / GLYCOLAX) packet Take 17 g by mouth daily. 14 each 0  . promethazine (PHENERGAN) 25 MG tablet Take 1 tablet (25 mg total) by mouth every 4 (four) hours as needed for nausea or vomiting. (Patient not taking: Reported on 03/02/2014) 120 tablet 2   No current facility-administered medications for this visit.     Past Medical History  Diagnosis Date  . Cirrhosis   . GERD (gastroesophageal reflux disease)   . Cervical disc syndrome     trouble turnng neck at times  . Chronic respiratory failure   . Diverticulitis of colon   . Perforation of colon   . IBS (irritable bowel syndrome)   . Chronic lower GI bleeding   . Overactive bladder   . Thrombocytopenia     sees Dr. Julien Nordmann   . Splenomegaly   . Depression   . Hypertension   . Asthma   . Heart murmur   . Hyperlipidemia   . COPD (chronic obstructive pulmonary disease)     sees Dr. Gwenette Greet   . Colostomy care   . Hypercalcemia   . CHF (congestive heart failure)   . Pacemaker 12/14/2013  . 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 Cervical cancer many years ago  . Complication of anesthesia     woke up during colonscopy and endoscopy in past  . History of blood transfusion "several"    "bleeding via ostomy" (01/04/2014)  . On home oxygen therapy     "2L; 24/7" (01/04/2014)  . Pneumonia "1-2 times"  . Chronic bronchitis "several times"  . Sleep apnea 2012    mild, no cpap needed  . Type II diabetes mellitus     sees Dr. Cruzita Lederer   . Chronic disease anemia     sees Dr. Julien Nordmann, due to chronic disease and GI losses   . History of stomach ulcers   . Migraine     "last one was several years ago" (01/04/2014)  . Arthritis     "tailbone; hands; legs" (01/04/2014)  . Anxiety   . Pericardial effusion 2007, 2015.   [name] Chronic abdominal pain     IBS    ROS:   All systems reviewed and negative except as noted in the HPI.   Past  Surgical History  Procedure Laterality Date  . Colostomy  11/06/2005  . Lung removal, partial Left 2004    upper lobe removed  . Left colectomy  11/06/2005    Hartmann resection of sigmoid colon and end colostomy.  . Esophagogastroduodenoscopy  03/19/2011    Procedure: ESOPHAGOGASTRODUODENOSCOPY (EGD);  Surgeon: [name], MD;  Location: [facility];  Service: Endoscopy;  Laterality: N/A;  . Colonoscopy  03/19/2011    Procedure: COLONOSCOPY;  Surgeon: [name], MD;  Location: [facility] ENDOSCOPY;  Service: Endoscopy;  Laterality: N/A;  . Givens capsule study  03/20/2011    Procedure: GIVENS CAPSULE STUDY;  Surgeon: [name], MD;  Location: [facility] ENDOSCOPY;  Service: Endoscopy;  Laterality: N/A;  . Small bowel obstruction repair  March 2012  . Umbilical hernia repair  March 2012  . Splenectomy, total N/A 02/24/2013    Procedure: SPLENECTOMY;  Surgeon: Harl Bowie, MD;  Location: [facility];  Service: General;  Laterality: N/A;  . Orif patella Left 04/21/2013    Procedure: OPEN REDUCTION INTERNAL (ORIF) FIXATION PATELLA;  Surgeon: Augustin Schooling, MD;  Location: [place];  Service: Orthopedics;  Laterality: Left;  . Pacemaker insertion  2015  . Esophagogastroduodenoscopy (egd) with propofol N/A 11/02/2013    Procedure: ESOPHAGOGASTRODUODENOSCOPY (EGD) WITH PROPOFOL;  Surgeon: [name], MD;  Location: [facility];  Service: Endoscopy;  Laterality: N/A;  EGD with APC  . Hot hemostasis N/A 11/02/2013    Procedure: HOT HEMOSTASIS (ARGON PLASMA COAGULATION/BICAP);  Surgeon: [name], MD;  Location: [facility];  Service: Endoscopy;  Laterality: N/A;  . Colon surgery    . Hernia repair    . Fracture surgery    . Abdominal hysterectomy    . Tubal ligation    . Tonsillectomy  1959  . Appendectomy  1962  . Cholecystectomy    . Cardiac catheterization  1967  . Permanent pacemaker insertion N/A 09/07/2013    Procedure: PERMANENT PACEMAKER INSERTION;  Surgeon: Evans Lance,  MD;  Location[facility] CATH LAB;  Service: Cardiovascular;  Laterality: N/A;  . Tee without cardioversion N/A 01/06/2014    Procedure: TRANSESOPHAGEAL ECHOCARDIOGRAM (TEE);  Surgeon: Lelon Perla, MD;  Location[facility] ENDOSCOPY;  Service: Cardiovascular;  Laterality: N/A;     Family History  Problem Relation Age of Onset  . Coronary artery disease    . Diabetes type II    . Anesthesia problems Neg Hx   . Hypotension Neg Hx   . Malignant hyperthermia Neg Hx   . Pseudochol deficiency Neg Hx   . Heart attack Mother   . Diabetes type II Mother   . Cirrhosis Father      History   Social History  . Marital Status: Divorced    Spouse Name: N/A    Number of Children: N/A  . Years of Education: N/A   Occupational History  . Disabled    Social History Main Topics  . Smoking status: Former Smoker -- 2.00 packs/day for 35 years    Types: Cigarettes    Quit date: 03/24/2002  . Smokeless tobacco: Never Used  . Alcohol Use: No  . Drug Use: No  . Sexual Activity: No   Other Topics Concern  . Not on file   Social History Narrative   Regular exercise: a little   Caffeine use: 2 cups of coffee in am           There were no vitals taken for this visit.  Physical Exam:  Chronically ill appearing NAD HEENT: Unremarkable Neck:  7-8 cm JVD, no thyromegally Back:  No CVA tenderness Lungs:  Rales in the bases about 1/4 up bilaterally, no wheezes or rhonchi HEART:  Regular rate rhythm, 2/6 systolic murmur, no rubs, no clicks Abd:  soft, positive bowel sounds, no organomegally, no rebound, no guarding Ext:  2 plus  pulses, 3+ peripheral edema, no cyanosis, no clubbing Skin:  No rashes no nodules Neuro:  CN II through XII intact, motor grossly intact   DEVICE  Normal device function.  See PaceArt for details.   Assess/Plan:

## 2014-03-03 ENCOUNTER — Ambulatory Visit: Payer: Medicare Other | Admitting: Infectious Diseases

## 2014-03-03 ENCOUNTER — Telehealth: Payer: Self-pay | Admitting: Family Medicine

## 2014-03-03 ENCOUNTER — Encounter: Payer: Self-pay | Admitting: Internal Medicine

## 2014-03-03 DIAGNOSIS — J441 Chronic obstructive pulmonary disease with (acute) exacerbation: Secondary | ICD-10-CM

## 2014-03-03 NOTE — Telephone Encounter (Signed)
[name] from [facility] calling because physical therapist came out and pt has gained 10 pounds. [name] would like an  order to have a nurse go out and check on patient tomorrow. Verbal is ok

## 2014-03-03 NOTE — Telephone Encounter (Signed)
Patient states [facility] (p) [phone], needs an order for the nebulizer with tubes and mask sent to (f) [phone].

## 2014-03-04 ENCOUNTER — Encounter (HOSPITAL_COMMUNITY): Payer: Self-pay | Admitting: Emergency Medicine

## 2014-03-04 ENCOUNTER — Emergency Department (HOSPITAL_COMMUNITY): Payer: Medicare Other

## 2014-03-04 ENCOUNTER — Emergency Department (HOSPITAL_COMMUNITY)
Admission: EM | Admit: 2014-03-04 | Discharge: 2014-03-05 | Disposition: A | Discharge status: Home / Self Care | Payer: Medicare Other | Attending: Emergency Medicine | Admitting: Emergency Medicine

## 2014-03-04 ENCOUNTER — Telehealth: Payer: Self-pay | Admitting: Internal Medicine

## 2014-03-04 DIAGNOSIS — Z87891 Personal history of nicotine dependence: Secondary | ICD-10-CM | POA: Diagnosis not present

## 2014-03-04 DIAGNOSIS — Z79899 Other long term (current) drug therapy: Secondary | ICD-10-CM | POA: Diagnosis not present

## 2014-03-04 DIAGNOSIS — E785 Hyperlipidemia, unspecified: Secondary | ICD-10-CM | POA: Insufficient documentation

## 2014-03-04 DIAGNOSIS — J449 Chronic obstructive pulmonary disease, unspecified: Secondary | ICD-10-CM | POA: Insufficient documentation

## 2014-03-04 DIAGNOSIS — E119 Type 2 diabetes mellitus without complications: Secondary | ICD-10-CM | POA: Diagnosis not present

## 2014-03-04 DIAGNOSIS — Z880 Allergy status to penicillin: Secondary | ICD-10-CM | POA: Diagnosis not present

## 2014-03-04 DIAGNOSIS — R1084 Generalized abdominal pain: Secondary | ICD-10-CM | POA: Diagnosis not present

## 2014-03-04 DIAGNOSIS — K219 Gastro-esophageal reflux disease without esophagitis: Secondary | ICD-10-CM | POA: Diagnosis not present

## 2014-03-04 DIAGNOSIS — Z8701 Personal history of pneumonia (recurrent): Secondary | ICD-10-CM | POA: Diagnosis not present

## 2014-03-04 DIAGNOSIS — Z950 Presence of cardiac pacemaker: Secondary | ICD-10-CM | POA: Insufficient documentation

## 2014-03-04 DIAGNOSIS — R509 Fever, unspecified: Secondary | ICD-10-CM | POA: Diagnosis not present

## 2014-03-04 DIAGNOSIS — Z7951 Long term (current) use of inhaled steroids: Secondary | ICD-10-CM | POA: Diagnosis not present

## 2014-03-04 DIAGNOSIS — Z8541 Personal history of malignant neoplasm of cervix uteri: Secondary | ICD-10-CM | POA: Diagnosis not present

## 2014-03-04 DIAGNOSIS — G8929 Other chronic pain: Secondary | ICD-10-CM | POA: Insufficient documentation

## 2014-03-04 DIAGNOSIS — I10 Essential (primary) hypertension: Secondary | ICD-10-CM | POA: Insufficient documentation

## 2014-03-04 DIAGNOSIS — I509 Heart failure, unspecified: Secondary | ICD-10-CM | POA: Diagnosis not present

## 2014-03-04 DIAGNOSIS — D649 Anemia, unspecified: Secondary | ICD-10-CM | POA: Insufficient documentation

## 2014-03-04 DIAGNOSIS — R0602 Shortness of breath: Secondary | ICD-10-CM

## 2014-03-04 DIAGNOSIS — Z9981 Dependence on supplemental oxygen: Secondary | ICD-10-CM | POA: Insufficient documentation

## 2014-03-04 DIAGNOSIS — Z85118 Personal history of other malignant neoplasm of bronchus and lung: Secondary | ICD-10-CM | POA: Diagnosis not present

## 2014-03-04 DIAGNOSIS — Z794 Long term (current) use of insulin: Secondary | ICD-10-CM | POA: Diagnosis not present

## 2014-03-04 DIAGNOSIS — R109 Unspecified abdominal pain: Secondary | ICD-10-CM

## 2014-03-04 DIAGNOSIS — R011 Cardiac murmur, unspecified: Secondary | ICD-10-CM | POA: Diagnosis not present

## 2014-03-04 DIAGNOSIS — Z8659 Personal history of other mental and behavioral disorders: Secondary | ICD-10-CM | POA: Insufficient documentation

## 2014-03-04 LAB — BASIC METABOLIC PANEL
Anion gap: 14 (ref 5–15)
BUN: 37 mg/dL — ABNORMAL HIGH (ref 6–23)
CO2: 26 mmol/L (ref 19–32)
CREATININE: 1.55 mg/dL — AB (ref 0.50–1.10)
Calcium: 11 mg/dL — ABNORMAL HIGH (ref 8.4–10.5)
Chloride: 92 mmol/L — ABNORMAL LOW (ref 96–112)
GFR calc Af Amer: 40 mL/min — ABNORMAL LOW (ref >90)
GFR, EST NON AFRICAN AMERICAN: 35 mL/min — AB (ref >90)
GLUCOSE: 171 mg/dL — AB (ref 70–99)
POTASSIUM: 3.6 mmol/L (ref 3.5–5.1)
SODIUM: 132 mmol/L — AB (ref 135–145)

## 2014-03-04 LAB — CBC WITH DIFFERENTIAL/PLATELET
Basophils Absolute: 0.1 10*3/uL (ref 0.0–0.1)
Basophils Relative: 1 % (ref 0–1)
Eosinophils Absolute: 1.2 10*3/uL — ABNORMAL HIGH (ref 0.0–0.7)
Eosinophils Relative: 8 % — ABNORMAL HIGH (ref 0–5)
HCT: 33.8 % — ABNORMAL LOW (ref 36.0–46.0)
HEMOGLOBIN: 11.3 g/dL — AB (ref 12.0–15.0)
LYMPHS ABS: 3.4 10*3/uL (ref 0.7–4.0)
Lymphocytes Relative: 24 % (ref 12–46)
MCH: 30.1 pg (ref 26.0–34.0)
MCHC: 33.4 g/dL (ref 30.0–36.0)
MCV: 89.9 fL (ref 78.0–100.0)
MONO ABS: 2.6 10*3/uL — AB (ref 0.1–1.0)
Monocytes Relative: 18 % — ABNORMAL HIGH (ref 3–12)
NEUTROS ABS: 6.8 10*3/uL (ref 1.7–7.7)
Neutrophils Relative %: 49 % (ref 43–77)
Platelets: 355 10*3/uL (ref 150–400)
RBC: 3.76 MIL/uL — ABNORMAL LOW (ref 3.87–5.11)
RDW: 16.5 % — ABNORMAL HIGH (ref 11.5–15.5)
WBC: 14 10*3/uL — AB (ref 4.0–10.5)

## 2014-03-04 LAB — URINALYSIS, ROUTINE W REFLEX MICROSCOPIC
Bilirubin Urine: NEGATIVE
GLUCOSE, UA: NEGATIVE mg/dL
Hgb urine dipstick: NEGATIVE
Ketones, ur: NEGATIVE mg/dL
NITRITE: NEGATIVE
Protein, ur: NEGATIVE mg/dL
Specific Gravity, Urine: 1.008 (ref 1.005–1.030)
UROBILINOGEN UA: 0.2 mg/dL (ref 0.0–1.0)
pH: 6 (ref 5.0–8.0)

## 2014-03-04 LAB — HEPATIC FUNCTION PANEL
ALBUMIN: 3.4 g/dL — AB (ref 3.5–5.2)
ALT: 44 U/L — ABNORMAL HIGH (ref 0–35)
AST: 59 U/L — ABNORMAL HIGH (ref 0–37)
Alkaline Phosphatase: 138 U/L — ABNORMAL HIGH (ref 39–117)
BILIRUBIN TOTAL: 0.4 mg/dL (ref 0.3–1.2)
Bilirubin, Direct: <0.1 mg/dL (ref 0.0–0.5)
TOTAL PROTEIN: 7.5 g/dL (ref 6.0–8.3)

## 2014-03-04 LAB — BRAIN NATRIURETIC PEPTIDE: B Natriuretic Peptide: 35.8 pg/mL (ref 0.0–100.0)

## 2014-03-04 LAB — URINE MICROSCOPIC-ADD ON

## 2014-03-04 LAB — I-STAT CG4 LACTIC ACID, ED: Lactic Acid, Venous: 2.74 mmol/L (ref 0.5–2.0)

## 2014-03-04 LAB — I-STAT TROPONIN, ED: Troponin i, poc: 0.02 ng/mL (ref 0.00–0.08)

## 2014-03-04 MED ORDER — HYDROMORPHONE HCL 1 MG/ML IJ SOLN
1.0000 mg | Freq: Once | INTRAMUSCULAR | Status: AC
  Administered 2014-03-04: 1 mg via INTRAVENOUS
  Filled 2014-03-04: qty 1

## 2014-03-04 MED ORDER — ONDANSETRON HCL 4 MG/2ML IJ SOLN
4.0000 mg | Freq: Once | INTRAMUSCULAR | Status: AC
  Administered 2014-03-04: 4 mg via INTRAVENOUS
  Filled 2014-03-04: qty 2

## 2014-03-04 MED ORDER — IOHEXOL 300 MG/ML  SOLN
100.0000 mL | Freq: Once | INTRAMUSCULAR | Status: AC | PRN
  Administered 2014-03-04: 80 mL via INTRAVENOUS

## 2014-03-04 MED ORDER — IOHEXOL 300 MG/ML  SOLN
25.0000 mL | Freq: Once | INTRAMUSCULAR | Status: AC | PRN
  Administered 2014-03-04: 25 mL via ORAL

## 2014-03-04 MED ORDER — SODIUM CHLORIDE 0.9 % IV BOLUS (SEPSIS)
1000.0000 mL | Freq: Once | INTRAVENOUS | Status: AC
  Administered 2014-03-04: 1000 mL via INTRAVENOUS

## 2014-03-04 NOTE — ED Provider Notes (Signed)
CSN: [id number]     Arrival date & time 03/04/14  2033 History   First MD Initiated Contact with Patient 03/04/14 2042     Chief Complaint  Patient presents with  . Shortness of Breath    The patient said she has had increased SOB, she is normally SOB but it has gotten worse since 1100am.    . Abdominal Pain     (Consider location/radiation/quality/duration/timing/severity/associated sxs/prior Treatment) HPI 64 year old female with past history as below notable for CHFstatus post pacemaker, COPD, diabetes, diverticular disease, cirrhosis,left sided colectomy s/p ostomy, chronic abdominal pain who presents to ED for one week of progressively worsening shortness of breath, cough as well as 2 days of progressively worsening abdominal tightness and pain. Patient also states today at her facility she was found to have a temperature of 101.5. Patient states her pain is currently rated 8/10. Pain is diffuse and does not radiate. Patient also noted having diarrhea and her ostomy bag which is now bloody. She states she has had bloody output for the past one year. Patient reports she has had progressively worsening lower extremity edema during this time. She has seen her cardiologist over this past week and they have advised her to take an additional medication (patient does not recall name) in addition to her Lasix for her swelling. Patient also states she seen her PCP who has put her on a Z-Pak for acute on chronic bronchitis. Spit patient also notes last month she was admitted to the hospital and discharged on a picc line for antibiotics following UTI and bacteremia.   Past Medical History  Diagnosis Date  . Cirrhosis   . GERD (gastroesophageal reflux disease)   . Cervical disc syndrome     trouble turnng neck at times  . Chronic respiratory failure   . Diverticulitis of colon   . Perforation of colon   . IBS (irritable bowel syndrome)   . Chronic lower GI bleeding   . Overactive bladder   .  Thrombocytopenia     sees Dr. Julien Nordmann   . Splenomegaly   . Depression   . Hypertension   . Asthma   . Heart murmur   . Hyperlipidemia   . COPD (chronic obstructive pulmonary disease)     sees Dr. Gwenette Greet   . Colostomy care   . Hypercalcemia   . CHF (congestive heart failure)   . Pacemaker 12/14/2013  . 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 Cervical cancer many years ago  . Complication of anesthesia     woke up during colonscopy and endoscopy in past  . History of blood transfusion "several"    "bleeding via ostomy" (01/04/2014)  . On home oxygen therapy     "2L; 24/7" (01/04/2014)  . Pneumonia "1-2 times"  . Chronic bronchitis "several times"  . Sleep apnea 2012    mild, no cpap needed  . Type II diabetes mellitus     sees Dr. Cruzita Lederer   . Chronic disease anemia     sees Dr. Julien Nordmann, due to chronic disease and GI losses   . History of stomach ulcers   . Migraine     "last one was several years ago" (01/04/2014)  . Arthritis     "tailbone; hands; legs" (01/04/2014)  . Anxiety   . Pericardial effusion 2007, 2015.   [name] Chronic abdominal pain     IBS   Past Surgical History  Procedure Laterality Date  . Colostomy  11/06/2005  . Lung removal, partial Left 2004    upper lobe removed  . Left colectomy  11/06/2005    Hartmann resection of sigmoid colon and end colostomy.  . Esophagogastroduodenoscopy  03/19/2011    Procedure: ESOPHAGOGASTRODUODENOSCOPY (EGD);  Surgeon: [name], MD;  Location: [facility];  Service: Endoscopy;  Laterality: N/A;  . Colonoscopy  03/19/2011    Procedure: COLONOSCOPY;  Surgeon: [name], MD;  Location: [facility];  Service: Endoscopy;  Laterality: N/A;  . Givens capsule study  03/20/2011    Procedure: GIVENS CAPSULE STUDY;  Surgeon: [name], MD;  Location: [facility];  Service: Endoscopy;  Laterality: N/A;  . Small bowel obstruction repair  March 2012  . Umbilical hernia repair  March 2012  .  Splenectomy, total N/A 02/24/2013    Procedure: SPLENECTOMY;  Surgeon: Harl Bowie, MD;  Location: [facility];  Service: General;  Laterality: N/A;  . Orif patella Left 04/21/2013    Procedure: OPEN REDUCTION INTERNAL (ORIF) FIXATION PATELLA;  Surgeon: Augustin Schooling, MD;  Location: [facility];  Service: Orthopedics;  Laterality: Left;  . Pacemaker insertion  2015  . Esophagogastroduodenoscopy (egd) with propofol N/A 11/02/2013    Procedure: ESOPHAGOGASTRODUODENOSCOPY (EGD) WITH PROPOFOL;  Surgeon: [name], MD;  Location: [facility];  Service: Endoscopy;  Laterality: N/A;  EGD with APC  . Hot hemostasis N/A 11/02/2013    Procedure: HOT HEMOSTASIS (ARGON PLASMA COAGULATION/BICAP);  Surgeon: [name], MD;  Location: [facility];  Service: Endoscopy;  Laterality: N/A;  . Colon surgery    . Hernia repair    . Fracture surgery    . Abdominal hysterectomy    . Tubal ligation    . Tonsillectomy  1959  . Appendectomy  1962  . Cholecystectomy    . Cardiac catheterization  1967  . Permanent pacemaker insertion N/A 09/07/2013    Procedure: PERMANENT PACEMAKER INSERTION;  Surgeon: Evans Lance, MD;  Location: [facility] CATH LAB;  Service: Cardiovascular;  Laterality: N/A;  . Tee without cardioversion N/A 01/06/2014    Procedure: TRANSESOPHAGEAL ECHOCARDIOGRAM (TEE);  Surgeon: Lelon Perla, MD;  Location: [facility] ENDOSCOPY;  Service: Cardiovascular;  Laterality: N/A;   Family History  Problem Relation Age of Onset  . Coronary artery disease    . Diabetes type II    . Anesthesia problems Neg Hx   . Hypotension Neg Hx   . Malignant hyperthermia Neg Hx   . Pseudochol deficiency Neg Hx   . Heart attack Mother   . Diabetes type II Mother   . Cirrhosis Father    History  Substance Use Topics  . Smoking status: Former Smoker -- 2.00 packs/day for 35 years    Types: Cigarettes    Quit date: 03/24/2002  . Smokeless tobacco: Never Used  . Alcohol Use: No   OB History    No data available      Review of Systems  Constitutional: Positive for fever. Negative for chills.  HENT: Negative for congestion, rhinorrhea and sore throat.   Eyes: Negative for visual disturbance.  Respiratory: Positive for shortness of breath. Negative for cough.   Cardiovascular: Positive for leg swelling. Negative for chest pain and palpitations.  Gastrointestinal: Positive for abdominal pain. Negative for nausea, vomiting, diarrhea and constipation.  Genitourinary: Negative for dysuria, hematuria, vaginal bleeding and vaginal discharge.  Musculoskeletal: Negative for back pain and neck pain.  Skin: Negative for rash.  Neurological: Negative for weakness and headaches.  All other systems  reviewed and are negative.     Allergies  Acetaminophen; Morphine; Morphine and related; Other; Penicillins; Trazodone and nefazodone; Codeine phosphate; Hydrocodone-acetaminophen; Cephalexin; and Hydrocodone-acetaminophen  Home Medications   Prior to Admission medications   Medication Sig Start Date End Date Taking? Authorizing Provider  albuterol (PROVENTIL) (2.5 MG/3ML) 0.083% nebulizer solution Take 3 mLs (2.5 mg total) by nebulization every 4 (four) hours as needed for wheezing or shortness of breath. 03/02/14   [name], MD  albuterol-ipratropium (COMBIVENT) [phone] MCG/ACT inhaler Inhale 2 puffs into the lungs every 4 (four) hours as needed for wheezing or shortness of breath.     Historical Provider, MD  atorvastatin (LIPITOR) 20 MG tablet Take 1 tablet (20 mg total) by mouth every morning. 03/02/14   [name], MD  azithromycin [facility]) 250 MG tablet As directed 03/02/14   [name], MD  diazepam (VALIUM) 5 MG tablet Take 1 tablet (5 mg total) by mouth 3 (three) times daily as needed. 03/02/14   [name], MD  dicyclomine (BENTYL) 10 MG capsule Take 10 mg by mouth 3 (three) times daily.     Historical Provider, MD  esomeprazole (NEXIUM) 40 MG capsule Take 1 capsule (40 mg total) by mouth every  evening. 03/02/14   [name], MD  fluconazole (DIFLUCAN) 150 MG tablet Take 1 tablet (150 mg total) by mouth once. 03/02/14   [name], MD  Fluticasone-Salmeterol (ADVAIR) 250-50 MCG/DOSE AEPB Inhale 1 puff into the lungs every 12 (twelve) hours. 03/02/14   [name], MD  folic acid (FOLVITE) 1 MG tablet Take 1 tablet (1 mg total) by mouth every evening. 04/12/13   [name], MD  furosemide (LASIX) 40 MG tablet Take 2 tablets (80 mg total) by mouth 2 (two) times daily. 03/02/14   [name], MD  hydrALAZINE (APRESOLINE) 25 MG tablet Take 1.5 tablets (37.5 mg total) by mouth 2 (two) times daily. 03/02/14   [name], MD  insulin regular human CONCENTRATED (HUMULIN R) 500 UNIT/ML SOLN injection Inject under skin 0.33 mL before b'fast, 0.33 mL before lunch and 0.30 mL before dinner 03/01/14   Philemon Kingdom, MD  INSULIN SYRINGE 1CC/29G (B-D INSULIN SYRINGE) 29G X 1/2" 1 ML MISC Use to inject insulin 3 times daily. 03/01/14   Philemon Kingdom, MD  iron polysaccharides (FERREX 150) 150 MG capsule Take 1 capsule (150 mg total) by mouth 2 (two) times daily. 03/02/14   [name], MD  isosorbide dinitrate (ISORDIL) 20 MG tablet Take 1 tablet (20 mg total) by mouth 2 (two) times daily. 10/13/13   [name], MD  losartan (COZAAR) 50 MG tablet Take 1 tablet (50 mg total) by mouth every morning. 03/02/14   [name], MD  metFORMIN (GLUCOPHAGE) 500 MG tablet Take 1 tablet (500 mg total) by mouth 2 (two) times daily with a meal. 03/01/14   Philemon Kingdom, MD  methocarbamol (ROBAXIN) 500 MG tablet Take 1 tablet (500 mg total) by mouth every 6 (six) hours as needed for muscle spasms. 05/03/13   Bary Leriche, PA-C  metoCLOPramide (REGLAN) 10 MG tablet Take 1 tablet (10 mg total) by mouth 3 (three) times daily. 04/12/13   [name], MD  metolazone (ZAROXOLYN) 2.5 MG tablet Take 1 tablet (2.5 mg total) by mouth daily. 03/02/14   Evans Lance, MD  Oxycodone HCl 20 MG TABS Take 1 tablet (20 mg total)  by mouth every 3 (three) hours  as needed (pain). 03/02/14   [name], MD  OXYGEN Inhale 2 L into the lungs continuous.    Historical Provider, MD  promethazine (PHENERGAN) 25 MG tablet Take 1 tablet (25 mg total) by mouth every 4 (four) hours as needed for nausea or vomiting. 11/23/13   [name], MD   BP 136/75 mmHg  Pulse 7  Temp(Src) 98 F (36.7 C) (Oral)  Resp 16  Ht 5' (1.524 m)  Wt 197 lb (89.359 kg)  BMI 38.47 kg/m2  SpO2 100% Physical Exam  Constitutional: She is oriented to person, place, and time. She appears well-developed and well-nourished. No distress.  HENT:  Head: Normocephalic and atraumatic.  Eyes: Conjunctivae are normal.  Neck: Normal range of motion.  Cardiovascular: Normal rate, regular rhythm, normal heart sounds and intact distal pulses.   No murmur heard. Pulmonary/Chest: Effort normal and breath sounds normal. No respiratory distress. She has no wheezes. She has no rales. She exhibits no tenderness.  Abdominal: Soft. Bowel sounds are normal. She exhibits no distension, no ascites and no mass. There is generalized tenderness. There is no rigidity, no rebound, no guarding, no tenderness at McBurney's point and negative Murphy's sign.  Diffuse abdominal tenderness. Ostomy has small amount of blood in bag which she says is chronic.  Musculoskeletal: Normal range of motion.  Neurological: She is alert and oriented to person, place, and time. No cranial nerve deficit.  Skin: Skin is warm and dry.  Psychiatric: She has a normal mood and affect.  Nursing note and vitals reviewed.   ED Course  Procedures (including critical care time) Labs Review Labs Reviewed  CBC WITH DIFFERENTIAL/PLATELET - Abnormal; Notable for the following:    WBC 14.0 (*)    RBC 3.76 (*)    Hemoglobin 11.3 (*)    HCT 33.8 (*)    RDW 16.5 (*)    Monocytes Relative 18 (*)    Monocytes Absolute 2.6 (*)    Eosinophils Relative 8 (*)    Eosinophils Absolute 1.2 (*)    All other  components within normal limits  BASIC METABOLIC PANEL - Abnormal; Notable for the following:    Sodium 132 (*)    Chloride 92 (*)    Glucose, Bld 171 (*)    BUN 37 (*)    Creatinine, Ser 1.55 (*)    Calcium 11.0 (*)    GFR calc non Af Amer 35 (*)    GFR calc Af Amer 40 (*)    All other components within normal limits  HEPATIC FUNCTION PANEL - Abnormal; Notable for the following:    Albumin 3.4 (*)    AST 59 (*)    ALT 44 (*)    Alkaline Phosphatase 138 (*)    All other components within normal limits  URINALYSIS, ROUTINE W REFLEX MICROSCOPIC - Abnormal; Notable for the following:    Leukocytes, UA TRACE (*)    All other components within normal limits  I-STAT CG4 LACTIC ACID, ED - Abnormal; Notable for the following:    Lactic Acid, Venous 2.74 (*)    All other components within normal limits  URINE CULTURE  CULTURE, BLOOD (ROUTINE X 2)  CULTURE, BLOOD (ROUTINE X 2)  BRAIN NATRIURETIC PEPTIDE  URINE MICROSCOPIC-ADD ON  I-STAT TROPOININ, ED  I-STAT CG4 LACTIC ACID, ED    Imaging Review Ct Abdomen Pelvis W Contrast  03/04/2014   CLINICAL DATA:  Abdominal pain. Long history of abdominal pain, increasing in severity today. History of lung  and cervical cancer.  EXAM: CT ABDOMEN AND PELVIS WITH CONTRAST  TECHNIQUE: Multidetector CT imaging of the abdomen and pelvis was performed using the standard protocol following bolus administration of intravenous contrast.  CONTRAST:  71mL OMNIPAQUE IOHEXOL 300 MG/ML SOLN, 26mL OMNIPAQUE IOHEXOL 300 MG/ML SOLN  COMPARISON:  Most recent CT 01/26/2014  FINDINGS: Stable scarring at the lung bases. Pacemaker wires are partially included. There is no consolidation or pleural effusion.  Nodular cirrhotic hepatic morphology with no focal hepatic lesion. Portal vein is patent. Clips in the gallbladder fossa from cholecystectomy. There is no biliary ductal dilatation. The spleen surgically absent. Presumed splenosis in the left upper quadrant of the abdomen.  The adrenal glands are normal. Kidneys demonstrate symmetric enhancement and excretion. No hydronephrosis or focal renal abnormality.  8 mm cyst in the pancreatic body and 1 cm hypodensity in the uncinate process, unchanged from prior exam. No pancreatic inflammatory change.  There are no dilated or thickened bowel loops. Unchanged size in appearance of left lower quadrant parastomal hernia. No fluid in the hernia sac. Small amount of soft tissue thickening just medial to the ostomy is unchanged. The colon is redundant.  The urinary bladder is physiologically distended. Minimal increased size right adnexal cyst, 3.3 cm, previously 2.1 cm. There is a small adjacent tubular cystic structure in the midline measuring 3.0 cm.  The uterus is not seen, presumably surgically absent. Stapled off sigmoid colon contains enteric contrast.  No free air, free fluid, or intra-abdominal fluid collection. The abdominal aorta is normal in caliber with atherosclerosis. Small preaortic lymph nodes at the level of the SMA, unchanged.  No lytic or blastic osseous lesion.  No acute osseous abnormality  IMPRESSION: 1. No definite acute abnormality in the abdomen or pelvis. 2. Increased size of right adnexal cystic structure, currently 3.3 cm, previously 2.1 cm. This could represent the right ovary if the ovary remains in situ. If there has been prior oophorectomy, the etiology is uncertain. This could represent peritoneal inclusion cyst. Correlation with surgical history is recommended. Nonemergent pelvic ultrasound could be considered for further evaluation. 3. Stable chronic findings include cirrhotic hepatic morphology, benign-appearing pancreatic cystic lesions, and left lower quadrant parastomal hernia.   Electronically Signed   By: Jeb Levering M.D.   On: 03/04/2014 23:48   Dg Chest Portable 1 View  03/04/2014   CLINICAL DATA:  Acute onset of severe shortness of breath.  EXAM: PORTABLE CHEST - 1 VIEW  COMPARISON:  01/09/2014   FINDINGS: The cardiac silhouette, mediastinal and hilar contours are within normal limits and stable. The pacer wires are stable. The right-sided PICC line is been removed. There are chronic bronchitic type interstitial lung changes but no acute pulmonary findings. The bony thorax is intact.  IMPRESSION: Chronic lung changes but no acute overlying pulmonary process.   Electronically Signed   By: Kalman Jewels M.D.   On: 03/04/2014 21:38     EKG Interpretation None      MDM   Final diagnoses:  None   SUNSHINE MACKOWSKI is a 64 y.o. female with H&P as above. Patient has numerous comer bit it isn't presents today with shortness of breath, abdominal pain, increased leg swelling. On arrival she is hemodynamic stable and in no apparent distress. Crackles on exam. 2+ pitting edema bilaterally. Diffuse abdominal tenderness. Ostomy has small amount of blood in bag which she says is chronic. Patient will receive screening labs, chest x-ray, CT abdomen for further evaluation. Labs notable for an increasing  creatinine to 1.55 and otherwise unremarkable. Chest x-ray shows chronic changes and no signs of florid pulmonary edema. CT abdomen and pelvis shows no acute findings. On review of records patient saw cardiology 3 days ago for eval of CHF. She was started on metolazone and advised to seek care at the ED for diuresis if her symptoms worsened as she would likely need admission. Will discuss with cardiology for admission. Cardiology has seen patient and they feel patient is not showing signs of CHF exacerbation. Further note, cardiology does not see sign of patient having CHF to begin with. They advise discontinuing metolazone and recommend discharge home. Patient should follow up close with her PCP. Strict return precautions or been discussed and she was understanding. Stable for discharge.  Clinical Impression: 1. SOB (shortness of breath)   2. Chronic abdominal pain     Disposition: Discharge  Condition:  Good  I have discussed the results, Dx and Tx plan with the pt(& family if present). He/she/they expressed understanding and agree(s) with the plan. Discharge instructions discussed at great length. Strict return precautions discussed and pt &/or family have verbalized understanding of the instructions. No further questions at time of discharge.    New Prescriptions   No medications on file    Follow Up: [name], MD [place] [address] [phone]  Schedule an appointment as soon as possible for a visit in 3 days   [facility] [address] [id number] [place] [phone]  If symptoms worsen   Pt seen in conjunction with Dr. Mariea Clonts, MD  Kirstie Peri, [facility] Resident - PGY-2     Kirstie Peri, MD 03/05/14 0110  Mariea Clonts, MD 03/05/14 [phone]

## 2014-03-04 NOTE — ED Notes (Signed)
Dr Reather Converse given a copy of lactic acid results 2.74

## 2014-03-04 NOTE — ED Notes (Signed)
The patient said she has had increased SOB, she is normally SOB but it has gotten worse since 1100am.  The patient said she has had SOB, abdominal pain and bleeding in her ostomy for three years but her SOB and abdominal pain has gotten worse since 1100 this morning.  He went to her PCP and they diagnosed her with bronchitis.  She was given a Zpack and told to come to the ED if she got worse.

## 2014-03-04 NOTE — Telephone Encounter (Signed)
The order was put in Epic, also faxed the printed order to below number. The medication for nebulizer was sent to [facility] at [facility] on 03/02/14.

## 2014-03-04 NOTE — Telephone Encounter (Signed)
Per [name] okay to give verbal order and I did speak with Pamala Hurry about this.

## 2014-03-04 NOTE — Telephone Encounter (Signed)
She is down 8 pounds but now she is having severe cramps in her stomach.  She is being treated with a Z-pack for bronchitis since 03/02/14 also.  Has taken the Z-pack before and is wondering if the cramps are related to the Metolazone which is a new medication as of 2/3.  I discussed with Roderic Palau, NP will have her decrease her Metolazone to every other day until her follow up on Tues and if she does not get any better or her symptoms worsen she will go to the ER

## 2014-03-04 NOTE — ED Notes (Signed)
  Pt transported to ct 

## 2014-03-04 NOTE — Telephone Encounter (Signed)
Pt c/o medication issue: 1. Name of Medication: Metolazone 2.5mg  2. How are you currently taking this medication (dosage and times per day)? One pill a day.. 3. Are you having a reaction (difficulty breathing--STAT)?  No 4. What is your medication issue? Pt is having stomach pains and want to know if it's coming from the medication

## 2014-03-05 ENCOUNTER — Emergency Department (HOSPITAL_COMMUNITY): Payer: Medicare Other

## 2014-03-05 DIAGNOSIS — R0602 Shortness of breath: Secondary | ICD-10-CM | POA: Diagnosis not present

## 2014-03-05 LAB — I-STAT CG4 LACTIC ACID, ED: LACTIC ACID, VENOUS: 1.86 mmol/L (ref 0.5–2.0)

## 2014-03-05 MED ORDER — HYDROMORPHONE HCL 1 MG/ML IJ SOLN
1.0000 mg | Freq: Once | INTRAMUSCULAR | Status: AC
  Administered 2014-03-05: 1 mg via INTRAVENOUS
  Filled 2014-03-05: qty 1

## 2014-03-05 MED ORDER — FUROSEMIDE 10 MG/ML IJ SOLN
60.0000 mg | Freq: Once | INTRAMUSCULAR | Status: AC
  Administered 2014-03-05: 60 mg via INTRAVENOUS
  Filled 2014-03-05: qty 6

## 2014-03-05 NOTE — ED Notes (Signed)
Patient is alert and orientedx4.  Patient was explained discharge instructions and they understood them with no questions.  The patient's daughter in law, [name] is taking the patient home.

## 2014-03-05 NOTE — Consult Note (Signed)
CARDIOLOGY CONSULT NOTE  Patient ID: [name]  MRN: [id number]  DOB: 11-11-1950  Admit date: 03/04/2014 Date of Consult: 03/05/2014  Primary Physician: [name], MD Primary Electrophysiologist: Cristopher Peru  Reason for Consultation: Edema/SOB  History of Present Illness: [name] is a 64 y.o. female with a history of sinus node dysfunction s/p PPM, hypertension, chronic bronchitis, who presented to the ED with several complaints including abdominal pain, LE edema, and SOB.  She saw Dr. Lovena Le in clinic 2 days ago who was concerned that worsening edema was related to diastolic dysfunction, and metolazone was added to her lasix.  She lost 8 lbs over the last 2 days, but has developed severe abdominal cramping, and presented to the ED.  Denies chest pain/pressure.  In ED, CT A/P without acute abnormality.  Lung bases are clear without edema/effusion.  2v CXR with haziness at left base but otherwise clear.  BNP normal at 35.8  Past Medical History  Diagnosis Date  . Cirrhosis   . GERD (gastroesophageal reflux disease)   . Cervical disc syndrome     trouble turnng neck at times  . Chronic respiratory failure   . Diverticulitis of colon   . Perforation of colon   . IBS (irritable bowel syndrome)   . Chronic lower GI bleeding   . Overactive bladder   . Thrombocytopenia     sees Dr. Julien Nordmann   . Splenomegaly   . Depression   . Hypertension   . Asthma   . Heart murmur   . Hyperlipidemia   . COPD (chronic obstructive pulmonary disease)     sees Dr. Gwenette Greet   . Colostomy care   . Hypercalcemia   . CHF (congestive heart failure)   . Pacemaker 12/14/2013  . 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 Cervical cancer many years ago  . Complication of anesthesia     woke up during colonscopy and endoscopy in past  . History of blood transfusion "several"    "bleeding via ostomy" (01/04/2014)  . On home oxygen therapy     "2L; 24/7" (01/04/2014)  .  Pneumonia "1-2 times"  . Chronic bronchitis "several times"  . Sleep apnea 2012    mild, no cpap needed  . Type II diabetes mellitus     sees Dr. Cruzita Lederer   . Chronic disease anemia     sees Dr. Julien Nordmann, due to chronic disease and GI losses   . History of stomach ulcers   . Migraine     "last one was several years ago" (01/04/2014)  . Arthritis     "tailbone; hands; legs" (01/04/2014)  . Anxiety   . Pericardial effusion 2007, 2015.   [name] Chronic abdominal pain     IBS    Past Surgical History  Procedure Laterality Date  . Colostomy  11/06/2005  . Lung removal, partial Left 2004    upper lobe removed  . Left colectomy  11/06/2005    Hartmann resection of sigmoid colon and end colostomy.  . Esophagogastroduodenoscopy  03/19/2011    Procedure: ESOPHAGOGASTRODUODENOSCOPY (EGD);  Surgeon: [name], MD;  Location: [facility];  Service: Endoscopy;  Laterality: N/A;  . Colonoscopy  03/19/2011    Procedure: COLONOSCOPY;  Surgeon: [name], MD;  Location: [facility];  Service: Endoscopy;  Laterality: N/A;  . Givens capsule study  03/20/2011    Procedure: GIVENS CAPSULE STUDY;  Surgeon: [name], MD;  Location:  [facility];  Service: Endoscopy;  Laterality: N/A;  . Small bowel obstruction repair  March 2012  . Umbilical hernia repair  March 2012  . Splenectomy, total N/A 02/24/2013    Procedure: SPLENECTOMY;  Surgeon: Harl Bowie, MD;  Location: [place];  Service: General;  Laterality: N/A;  . Orif patella Left 04/21/2013    Procedure: OPEN REDUCTION INTERNAL (ORIF) FIXATION PATELLA;  Surgeon: Augustin Schooling, MD;  Location: [place];  Service: Orthopedics;  Laterality: Left;  . Pacemaker insertion  2015  . Esophagogastroduodenoscopy (egd) with propofol N/A 11/02/2013    Procedure: ESOPHAGOGASTRODUODENOSCOPY (EGD) WITH PROPOFOL;  Surgeon: [name], MD;  Location: [facility];  Service: Endoscopy;  Laterality: N/A;  EGD with APC  . Hot hemostasis N/A 11/02/2013     Procedure: HOT HEMOSTASIS (ARGON PLASMA COAGULATION/BICAP);  Surgeon: [name], MD;  Location: [facility];  Service: Endoscopy;  Laterality: N/A;  . Colon surgery    . Hernia repair    . Fracture surgery    . Abdominal hysterectomy    . Tubal ligation    . Tonsillectomy  1959  . Appendectomy  1962  . Cholecystectomy    . Cardiac catheterization  1967  . Permanent pacemaker insertion N/A 09/07/2013    Procedure: PERMANENT PACEMAKER INSERTION;  Surgeon: Evans Lance, MD;  Location: [facility] CATH LAB;  Service: Cardiovascular;  Laterality: N/A;  . Tee without cardioversion N/A 01/06/2014    Procedure: TRANSESOPHAGEAL ECHOCARDIOGRAM (TEE);  Surgeon: Lelon Perla, MD;  Location: [facility] ENDOSCOPY;  Service: Cardiovascular;  Laterality: N/A;     Home Meds: Prior to Admission medications   Medication Sig Start Date End Date Taking? Authorizing Provider  albuterol (PROVENTIL) (2.5 MG/3ML) 0.083% nebulizer solution Take 3 mLs (2.5 mg total) by nebulization every 4 (four) hours as needed for wheezing or shortness of breath. 03/02/14   [name], MD  albuterol-ipratropium (COMBIVENT) [phone] MCG/ACT inhaler Inhale 2 puffs into the lungs every 4 (four) hours as needed for wheezing or shortness of breath.     Historical Provider, MD  atorvastatin (LIPITOR) 20 MG tablet Take 1 tablet (20 mg total) by mouth every morning. 03/02/14   [name], MD  azithromycin [facility]) 250 MG tablet As directed 03/02/14   [name], MD  diazepam (VALIUM) 5 MG tablet Take 1 tablet (5 mg total) by mouth 3 (three) times daily as needed. 03/02/14   [name], MD  dicyclomine (BENTYL) 10 MG capsule Take 10 mg by mouth 3 (three) times daily.     Historical Provider, MD  esomeprazole (NEXIUM) 40 MG capsule Take 1 capsule (40 mg total) by mouth every evening. 03/02/14   [name], MD  fluconazole (DIFLUCAN) 150 MG tablet Take 1 tablet (150 mg total) by mouth once. 03/02/14   [name], MD    Fluticasone-Salmeterol (ADVAIR) 250-50 MCG/DOSE AEPB Inhale 1 puff into the lungs every 12 (twelve) hours. 03/02/14   [name], MD  folic acid (FOLVITE) 1 MG tablet Take 1 tablet (1 mg total) by mouth every evening. 04/12/13   [name], MD  furosemide (LASIX) 40 MG tablet Take 2 tablets (80 mg total) by mouth 2 (two) times daily. 03/02/14   [name], MD  hydrALAZINE (APRESOLINE) 25 MG tablet Take 1.5 tablets (37.5 mg total) by mouth 2 (two) times daily. 03/02/14   [name], MD  insulin regular human CONCENTRATED (HUMULIN R) 500 UNIT/ML SOLN injection Inject  under skin 0.33 mL before b'fast, 0.33 mL before lunch and 0.30 mL before dinner 03/01/14   Philemon Kingdom, MD  INSULIN SYRINGE 1CC/29G (B-D INSULIN SYRINGE) 29G X 1/2" 1 ML MISC Use to inject insulin 3 times daily. 03/01/14   Philemon Kingdom, MD  iron polysaccharides (FERREX 150) 150 MG capsule Take 1 capsule (150 mg total) by mouth 2 (two) times daily. 03/02/14   [name], MD  isosorbide dinitrate (ISORDIL) 20 MG tablet Take 1 tablet (20 mg total) by mouth 2 (two) times daily. 10/13/13   [name], MD  losartan (COZAAR) 50 MG tablet Take 1 tablet (50 mg total) by mouth every morning. 03/02/14   [name], MD  metFORMIN (GLUCOPHAGE) 500 MG tablet Take 1 tablet (500 mg total) by mouth 2 (two) times daily with a meal. 03/01/14   Philemon Kingdom, MD  methocarbamol (ROBAXIN) 500 MG tablet Take 1 tablet (500 mg total) by mouth every 6 (six) hours as needed for muscle spasms. 05/03/13   Bary Leriche, PA-C  metoCLOPramide (REGLAN) 10 MG tablet Take 1 tablet (10 mg total) by mouth 3 (three) times daily. 04/12/13   [name], MD  metolazone (ZAROXOLYN) 2.5 MG tablet Take 1 tablet (2.5 mg total) by mouth daily. 03/02/14   Evans Lance, MD  Oxycodone HCl 20 MG TABS Take 1 tablet (20 mg total) by mouth every 3 (three) hours as needed (pain). 03/02/14   [name], MD  OXYGEN Inhale 2 L into the lungs continuous.    Historical Provider,  MD  promethazine (PHENERGAN) 25 MG tablet Take 1 tablet (25 mg total) by mouth every 4 (four) hours as needed for nausea or vomiting. 11/23/13   [name], MD    Current Medications: .  HYDROmorphone (DILAUDID) injection  1 mg Intravenous Once     Allergies:    Allergies  Allergen Reactions  . Acetaminophen Other (See Comments)    Cirrhosis of liver  . Morphine Other (See Comments)    REACTION: Lowers BP  . Morphine And Related Other (See Comments)    Blood pressure drops   . Other Other (See Comments)    AGENT:  Per pt, CANNOT TAKE ANY FORM OF BLOOD THINNER, due to cirrhosis of the liver  . Penicillins Anaphylaxis and Rash  . Trazodone And Nefazodone Other (See Comments)    Cardiac arrythmia - DO NOT USE  . Codeine Phosphate Other (See Comments)    REACTION: Stomach cramps  . Hydrocodone-Acetaminophen Other (See Comments)    REACTION: hallucinations  . Cephalexin Swelling and Rash  . Hydrocodone-Acetaminophen Other (See Comments)    unknown    Social History:   The patient  reports that she quit smoking about 11 years ago. Her smoking use included Cigarettes. She has a 70 pack-year smoking history. She has never used smokeless tobacco. She reports that she does not drink alcohol or use illicit drugs.    Family History:   The patient's family history includes Cirrhosis in her father; Coronary artery disease in an other family member; Diabetes type II in her mother and another family member; Heart attack in her mother. There is no history of Anesthesia problems, Hypotension, Malignant hyperthermia, or Pseudochol deficiency.   ROS:  Please see the history of present illness. All other systems reviewed and negative.   Vital Signs: Blood pressure 147/57, pulse 92, temperature 100.3 F (37.9 C), temperature source Rectal, resp. rate 14, height 5' (1.524 m), weight  89.359 kg (197 lb), SpO2 96 %.   PHYSICAL EXAM: General:  Well nourished, well developed, in no acute  distress HEENT: normal Lymph: no adenopathy Neck: no JVD Endocrine:  No thryomegaly Vascular: No carotid bruits; FA pulses 2+ bilaterally without bruits Cardiac:  normal S1, S2; RRR; no murmur Lungs:  clear to auscultation bilaterally, no wheezing, rhonchi or rales Abd: diffuse tenderness, no evidence for acute abdomen Ext: 2+ bilateral pitting edema. Musculoskeletal:  No deformities, BUE and BLE strength normal and equal Skin: warm and dry Neuro:  CNs 2-12 intact, no focal abnormalities noted Psych:  Normal affect   EKG:  Sinus tachycardia with a PAC, poor R wave progression. QT prolongation. Similar to 12/8 ECG  Labs: No results for input(s): CKTOTAL, CKMB, TROPONINI in the last 72 hours. Lab Results  Component Value Date   WBC 14.0* 03/04/2014   HGB 11.3* 03/04/2014   HCT 33.8* 03/04/2014   MCV 89.9 03/04/2014   PLT 355 03/04/2014    Recent Labs Lab 03/04/14 2100 03/04/14 2149  NA 132*  --   K 3.6  --   CL 92*  --   CO2 26  --   BUN 37*  --   CREATININE 1.55*  --   CALCIUM 11.0*  --   PROT  --  7.5  BILITOT  --  0.4  ALKPHOS  --  138*  ALT  --  44*  AST  --  59*  GLUCOSE 171*  --     BNP    Component Value Date/Time   BNP 35.8 03/04/2014 2100    ProBNP    Component Value Date/Time   PROBNP 558.0* 09/02/2013 1803     Radiology/Studies:  Dg Chest 2 View  03/05/2014   CLINICAL DATA:  Increasing shortness of breath.  Abdominal pain.  EXAM: CHEST  2 VIEW  COMPARISON:  Chest radiograph 4 hr prior.  FINDINGS: Dual lead left-sided pacemaker in place. Lower lung volumes from prior leading to accentuation of the cardiac size. Prominent interstitial markings are again seen, however there is developing perivascular haziness in the left lung. Questionable small left pleural effusion. No confluent airspace disease.  IMPRESSION: Perivascular haziness in the left lung, may reflect developing pulmonary edema. Underlying chronic lung disease again seen.   Electronically  Signed   By: Jeb Levering M.D.   On: 03/05/2014 01:22   Ct Abdomen Pelvis W Contrast  03/04/2014   CLINICAL DATA:  Abdominal pain. Long history of abdominal pain, increasing in severity today. History of lung and cervical cancer.  EXAM: CT ABDOMEN AND PELVIS WITH CONTRAST  TECHNIQUE: Multidetector CT imaging of the abdomen and pelvis was performed using the standard protocol following bolus administration of intravenous contrast.  CONTRAST:  88mL OMNIPAQUE IOHEXOL 300 MG/ML SOLN, 73mL OMNIPAQUE IOHEXOL 300 MG/ML SOLN  COMPARISON:  Most recent CT 01/26/2014  FINDINGS: Stable scarring at the lung bases. Pacemaker wires are partially included. There is no consolidation or pleural effusion.  Nodular cirrhotic hepatic morphology with no focal hepatic lesion. Portal vein is patent. Clips in the gallbladder fossa from cholecystectomy. There is no biliary ductal dilatation. The spleen surgically absent. Presumed splenosis in the left upper quadrant of the abdomen. The adrenal glands are normal. Kidneys demonstrate symmetric enhancement and excretion. No hydronephrosis or focal renal abnormality.  8 mm cyst in the pancreatic body and 1 cm hypodensity in the uncinate process, unchanged from prior exam. No pancreatic inflammatory change.  There are no dilated or thickened bowel  loops. Unchanged size in appearance of left lower quadrant parastomal hernia. No fluid in the hernia sac. Small amount of soft tissue thickening just medial to the ostomy is unchanged. The colon is redundant.  The urinary bladder is physiologically distended. Minimal increased size right adnexal cyst, 3.3 cm, previously 2.1 cm. There is a small adjacent tubular cystic structure in the midline measuring 3.0 cm.  The uterus is not seen, presumably surgically absent. Stapled off sigmoid colon contains enteric contrast.  No free air, free fluid, or intra-abdominal fluid collection. The abdominal aorta is normal in caliber with atherosclerosis. Small  preaortic lymph nodes at the level of the SMA, unchanged.  No lytic or blastic osseous lesion.  No acute osseous abnormality  IMPRESSION: 1. No definite acute abnormality in the abdomen or pelvis. 2. Increased size of right adnexal cystic structure, currently 3.3 cm, previously 2.1 cm. This could represent the right ovary if the ovary remains in situ. If there has been prior oophorectomy, the etiology is uncertain. This could represent peritoneal inclusion cyst. Correlation with surgical history is recommended. Nonemergent pelvic ultrasound could be considered for further evaluation. 3. Stable chronic findings include cirrhotic hepatic morphology, benign-appearing pancreatic cystic lesions, and left lower quadrant parastomal hernia.   Electronically Signed   By: Jeb Levering M.D.   On: 03/04/2014 23:48   US Venous Img Upper Uni Right  02/23/2014   CLINICAL DATA:  64 year old female with right upper extremity pain and swelling. Had a right upper extremity PICC in that extremity which was removed yesterday, 02/22/2014. Evaluate for DVT.  EXAM: RIGHT UPPER EXTREMITY VENOUS DOPPLER ULTRASOUND  TECHNIQUE: Gray-scale sonography with graded compression, as well as color Doppler and duplex ultrasound were performed to evaluate the upper extremity deep venous system from the level of the subclavian vein and including the jugular, axillary, basilic, radial, ulnar and upper cephalic vein. Spectral Doppler was utilized to evaluate flow at rest and with distal augmentation maneuvers.  COMPARISON:  None.  FINDINGS: Contralateral Subclavian Vein: Respiratory phasicity is normal and symmetric with the symptomatic side. No evidence of thrombus. Normal compressibility.  Internal Jugular Vein: No evidence of thrombus. Normal compressibility, respiratory phasicity and response to augmentation.  Subclavian Vein: No evidence of thrombus. Normal compressibility, respiratory phasicity and response to augmentation.  Axillary Vein: No  evidence of thrombus. Normal compressibility, respiratory phasicity and response to augmentation.  Cephalic Vein: No evidence of thrombus. Normal compressibility, respiratory phasicity and response to augmentation.  Basilic Vein: No evidence of thrombus. Normal compressibility, respiratory phasicity and response to augmentation.  Brachial Veins: No evidence of thrombus. Normal compressibility, respiratory phasicity and response to augmentation.  Radial Veins: No evidence of thrombus. Normal compressibility, respiratory phasicity and response to augmentation.  Ulnar Veins: No evidence of thrombus. Normal compressibility, respiratory phasicity and response to augmentation.  Venous Reflux:  None visualized.  Other Findings:  None visualized.  IMPRESSION: No evidence of deep venous thrombosis.   Electronically Signed   By: Jacqulynn Cadet M.D.   On: 02/23/2014 15:41   Dg Chest Portable 1 View  03/04/2014   CLINICAL DATA:  Acute onset of severe shortness of breath.  EXAM: PORTABLE CHEST - 1 VIEW  COMPARISON:  01/09/2014  FINDINGS: The cardiac silhouette, mediastinal and hilar contours are within normal limits and stable. The pacer wires are stable. The right-sided PICC line is been removed. There are chronic bronchitic type interstitial lung changes but no acute pulmonary findings. The bony thorax is intact.  IMPRESSION: Chronic lung changes  but no acute overlying pulmonary process.   Electronically Signed   By: Kalman Jewels M.D.   On: 03/04/2014 21:38    ASSESSMENT AND PLAN:  1. SOB, Edema. - She appears euvolemic on my exam with a normal JVP.  BNP is normal.  She may be over diuresed and intravascularly dry, which would be the cause of her mild elevation in BUN/Cr.  Lungs are clear on exam and imaging. - She is stable at this point from a cardiac perspective.  Recommend discontinuing metolazone.  She is scheduled to follow up with outpatient labs on Monday.   Cleotis Lema 03/05/2014 1:35 AM

## 2014-03-06 LAB — URINE CULTURE: Colony Count: 9000

## 2014-03-07 ENCOUNTER — Other Ambulatory Visit (INDEPENDENT_AMBULATORY_CARE_PROVIDER_SITE_OTHER): Payer: Medicare Other | Admitting: *Deleted

## 2014-03-07 ENCOUNTER — Other Ambulatory Visit: Payer: Medicare Other

## 2014-03-07 DIAGNOSIS — I5032 Chronic diastolic (congestive) heart failure: Secondary | ICD-10-CM

## 2014-03-08 ENCOUNTER — Encounter: Payer: Self-pay | Admitting: Physician Assistant

## 2014-03-08 ENCOUNTER — Ambulatory Visit (INDEPENDENT_AMBULATORY_CARE_PROVIDER_SITE_OTHER): Payer: Medicare Other | Admitting: Physician Assistant

## 2014-03-08 VITALS — BP 165/60 | HR 113 | Ht 60.0 in | Wt 190.0 lb

## 2014-03-08 DIAGNOSIS — Z950 Presence of cardiac pacemaker: Secondary | ICD-10-CM

## 2014-03-08 DIAGNOSIS — N179 Acute kidney failure, unspecified: Secondary | ICD-10-CM

## 2014-03-08 DIAGNOSIS — I5032 Chronic diastolic (congestive) heart failure: Secondary | ICD-10-CM

## 2014-03-08 DIAGNOSIS — I10 Essential (primary) hypertension: Secondary | ICD-10-CM

## 2014-03-08 DIAGNOSIS — R1084 Generalized abdominal pain: Secondary | ICD-10-CM

## 2014-03-08 DIAGNOSIS — R079 Chest pain, unspecified: Secondary | ICD-10-CM

## 2014-03-08 DIAGNOSIS — J449 Chronic obstructive pulmonary disease, unspecified: Secondary | ICD-10-CM

## 2014-03-08 LAB — BASIC METABOLIC PANEL
BUN: 54 mg/dL — AB (ref 6–23)
CO2: 28 meq/L (ref 19–32)
CREATININE: 1.79 mg/dL — AB (ref 0.40–1.20)
Calcium: 10.9 mg/dL — ABNORMAL HIGH (ref 8.4–10.5)
Chloride: 91 mEq/L — ABNORMAL LOW (ref 96–112)
GFR: 30.49 mL/min — ABNORMAL LOW (ref >60.00)
Glucose, Bld: 219 mg/dL — ABNORMAL HIGH (ref 70–99)
Potassium: 3.4 mEq/L — ABNORMAL LOW (ref 3.5–5.1)
Sodium: 134 mEq/L — ABNORMAL LOW (ref 135–145)

## 2014-03-08 NOTE — Patient Instructions (Signed)
LAB WORK BMET TO BE DONE EITHER THIS Friday 2/12 OR Monday 2/15   FOLLOW UP WITH SCOTT WEAVER, PA IN 2 WEEKS SAME DAY DR. Lovena Le IS IN THE OFFICE  Your physician has recommended you make the following change in your medication:  1. HOLD COZAAR FOR 2 DAYS THEN RESUME 2. STOP METOLAZONE 3. DECREASE LASIX TO 80 MG DAILY FOR 2 DAYS THEN RESUME LASIX 41 MG TWICE DAILY   PER SCOTT WEAVER, PAC HE ADVISED FOR YOU TO CALL DR. Barbie Banner OFFICE TODAY TO GET AN APPT FOR THIS WEEK TO DISCUSS ABOUT YOUR ABDOMINAL PAIN AS WELL AS A MASS FOUND ON THE RIGHT OVARY ON THE CT YOU HAD WHILE IN THE ED RECENTLY

## 2014-03-08 NOTE — Progress Notes (Signed)
Cardiology Office Note   Date:  03/08/2014   ID:  [name], DOB 10/27/1950, MRN [id number]  PCP:  [name], MD  Cardiologist/Electrophysiologist:  Dr. Cristopher Peru   Chief Complaint  Patient presents with  . Hospitalization Follow-up    ED visit with LE edema, abdominal pain, SOB  . Congestive Heart Failure     History of Present Illness: [name] is a 64 y.o. female with a hx of symptomatic sinus node dysfunction s/p dual chamber PPM 08/2013, HTN, HL, DM, cirrhosis, chronic anemia 2/2 to GI bleed, COPD on home O2, OSA, diastolic HF.  She had a perforated colon in the past during colonoscopy in the setting of diverticulitis requiring colectomy and colostomy. She has noted occasional blood in her colostomy bag for 3 years.    Admitted with Gram + bacteremia in 12/2013.  There was concern that her pacemaker lead may need to be extracted.  Her TEE was neg for vegetation.  She was therefore treated with antibiotics.    Fu with Dr. Cristopher Peru 03/02/14.  She had worsening symptoms of volume excess and Metolazone was added to her regimen.  She was to return 03/08/2014 for FU evaluation.  She c/o chest pain thought to be related to volume overload.    Seen in ED 2/6 by the cardiology fellow with complaints of abdominal pain, LE edema, dyspnea.  She had lost 8 lbs since starting Metolazone, but developed severe abdominal cramping.  CXR demonstrated some haziness on the L base, but o/w clear.  BNP normal (35.8).  She appeared to be euvolemic.  BUN and Cr was elevated. Metolazone was DC'd.  FU as OP was recommended and she was DC'd to home.   She has multiple complaints.  She remains short of breath with minimal activity.  She is NYHA 3.  She sleeps on 2 pillows.  She was recently treated for bronchitis. Her breathing worsened with this.  It has improved with antibiotic Rx.  She notes increasing her pillows to 3 as well.  She occasionally awakens short of breath.  She feels that she  continues to hold onto fluid.  However, she is down 15 lbs since last week.  BMET yesterday shows pre-renal azotemia and worsening creatinine.  She continues to note exertional chest heaviness. She denies assoc radiation, nausea or diaphoresis.  She denies syncope. Her cough is productive of yellow sputum.  It is improved with antibiotic Rx.  She continues to have abdominal pain that is overall worse.  She tells me that she take narcotics for this pain.     Studies/Reports Reviewed Today:  TE-Echocardiogram 01/06/14 - EF 55% to 60%. Wall motion was normal. - Left atrium: The atrium was mildly dilated. No LA or LAA clot - Atrial septum: There was a patent foramen ovale. - Trace MR and TR - No valvular vegetation  Nuclear Stress Test 06/2013 Low risk nuclear stress test. No evidence of ischemia.  Normal ejection fraction of 63%.  2D Echocardiogram 07/13/13 - Mild focal basal hypertrophy of the septum. EF 60% to 65%.  Wall motion was normal. Grade 2 diastolic dysfunction. - Left atrium: The atrium was moderately dilated. - Pericardium, extracardiac: A small to moderate pericardial effusion circumferential to the heart. No evidence of hemodynamic compromise.  Abdominal CT 03/04/14 1. No definite acute abnormality in the abdomen or pelvis. 2. Increased size of right adnexal cystic structure, currently 3.3 cm, previously 2.1 cm. This could represent the right ovary if the  ovary remains in situ. If there has been prior oophorectomy, the etiology is uncertain. This could represent peritoneal inclusion cyst. Correlation with surgical history is recommended. Non-emergent pelvic ultrasound could be considered for further evaluation. 3. Stable chronic findings include cirrhotic hepatic morphology, benign-appearing pancreatic cystic lesions, and left lower quadrant parastomal hernia.   Past Medical History  Diagnosis Date  . Cirrhosis   . GERD (gastroesophageal reflux disease)   . Cervical disc  syndrome     trouble turnng neck at times  . Chronic respiratory failure   . Diverticulitis of colon   . Perforation of colon   . IBS (irritable bowel syndrome)   . Chronic lower GI bleeding   . Overactive bladder   . Thrombocytopenia     sees Dr. Julien Nordmann   . Splenomegaly   . Depression   . Hypertension   . Asthma   . Heart murmur   . Hyperlipidemia   . COPD (chronic obstructive pulmonary disease)     sees Dr. Gwenette Greet   . Colostomy care   . Hypercalcemia   . CHF (congestive heart failure)   . Pacemaker 12/14/2013  . 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 Cervical cancer many years ago  . Complication of anesthesia     woke up during colonscopy and endoscopy in past  . History of blood transfusion "several"    "bleeding via ostomy" (01/04/2014)  . On home oxygen therapy     "2L; 24/7" (01/04/2014)  . Pneumonia "1-2 times"  . Chronic bronchitis "several times"  . Sleep apnea 2012    mild, no cpap needed  . Type II diabetes mellitus     sees Dr. Cruzita Lederer   . Chronic disease anemia     sees Dr. Julien Nordmann, due to chronic disease and GI losses   . History of stomach ulcers   . Migraine     "last one was several years ago" (01/04/2014)  . Arthritis     "tailbone; hands; legs" (01/04/2014)  . Anxiety   . Pericardial effusion 2007, 2015.   [name] Chronic abdominal pain     IBS    Past Surgical History  Procedure Laterality Date  . Colostomy  11/06/2005  . Lung removal, partial Left 2004    upper lobe removed  . Left colectomy  11/06/2005    Hartmann resection of sigmoid colon and end colostomy.  . Esophagogastroduodenoscopy  03/19/2011    Procedure: ESOPHAGOGASTRODUODENOSCOPY (EGD);  Surgeon: [name], MD;  Location: [facility];  Service: Endoscopy;  Laterality: N/A;  . Colonoscopy  03/19/2011    Procedure: COLONOSCOPY;  Surgeon: [name], MD;  Location: [facility];  Service: Endoscopy;  Laterality: N/A;  . Givens capsule study  03/20/2011      Procedure: GIVENS CAPSULE STUDY;  Surgeon: [name], MD;  Location: [facility];  Service: Endoscopy;  Laterality: N/A;  . Small bowel obstruction repair  March 2012  . Umbilical hernia repair  March 2012  . Splenectomy, total N/A 02/24/2013    Procedure: SPLENECTOMY;  Surgeon: Harl Bowie, MD;  Location: [facility];  Service: General;  Laterality: N/A;  . Orif patella Left 04/21/2013    Procedure: OPEN REDUCTION INTERNAL (ORIF) FIXATION PATELLA;  Surgeon: Augustin Schooling, MD;  Location: [facility];  Service: Orthopedics;  Laterality: Left;  . Pacemaker insertion  2015  . Esophagogastroduodenoscopy (egd) with propofol N/A 11/02/2013    Procedure: ESOPHAGOGASTRODUODENOSCOPY (EGD) WITH PROPOFOL;  Surgeon:  [name], MD;  Location: [facility];  Service: Endoscopy;  Laterality: N/A;  EGD with APC  . Hot hemostasis N/A 11/02/2013    Procedure: HOT HEMOSTASIS (ARGON PLASMA COAGULATION/BICAP);  Surgeon: [name], MD;  Location: [facility];  Service: Endoscopy;  Laterality: N/A;  . Colon surgery    . Hernia repair    . Fracture surgery    . Abdominal hysterectomy    . Tubal ligation    . Tonsillectomy  1959  . Appendectomy  1962  . Cholecystectomy    . Cardiac catheterization  1967  . Permanent pacemaker insertion N/A 09/07/2013    Procedure: PERMANENT PACEMAKER INSERTION;  Surgeon: Evans Lance, MD;  Location[facility] C[facility] LAB;  Service: Cardiovascular;  Laterality: N/A;  . Tee without cardioversion N/A 01/06/2014    Procedure: TRANSESOPHAGEAL ECHOCARDIOGRAM (TEE);  Surgeon: Lelon Perla, MD;  Location: [facility] ENDOSCOPY;  Service: Cardiovascular;  Laterality: N/A;     Current Outpatient Prescriptions  Medication Sig Dispense Refill  . albuterol (PROVENTIL) (2.5 MG/3ML) 0.083% nebulizer solution Take 3 mLs (2.5 mg total) by nebulization every 4 (four) hours as needed for wheezing or shortness of breath. 75 mL 12  . albuterol-ipratropium (COMBIVENT) 18-103 MCG/ACT inhaler  Inhale 2 puffs into the lungs every 4 (four) hours as needed for wheezing or shortness of breath.     [name] atorvastatin (LIPITOR) 20 MG tablet Take 1 tablet (20 mg total) by mouth every morning. 90 tablet 3  . diazepam (VALIUM) 5 MG tablet Take 1 tablet (5 mg total) by mouth 3 (three) times daily as needed. 90 tablet 5  . dicyclomine (BENTYL) 10 MG capsule Take 10 mg by mouth 3 (three) times daily.     [name] esomeprazole (NEXIUM) 40 MG capsule Take 1 capsule (40 mg total) by mouth every evening. 90 capsule 3  . Fluticasone-Salmeterol (ADVAIR) 250-50 MCG/DOSE AEPB Inhale 1 puff into the lungs every 12 (twelve) hours. 161 each 3  . folic acid (FOLVITE) 1 MG tablet Take 1 tablet (1 mg total) by mouth every evening. 90 tablet 3  . furosemide (LASIX) 40 MG tablet Take 2 tablets (80 mg total) by mouth 2 (two) times daily. 180 tablet 3  . hydrALAZINE (APRESOLINE) 25 MG tablet Take 1.5 tablets (37.5 mg total) by mouth 2 (two) times daily. 270 tablet 3  . insulin regular human CONCENTRATED (HUMULIN R) 500 UNIT/ML SOLN injection Inject under skin 0.33 mL before b'fast, 0.33 mL before lunch and 0.30 mL before dinner 40 mL 2  . INSULIN SYRINGE 1CC/29G (B-D INSULIN SYRINGE) 29G X 1/2" 1 ML MISC Use to inject insulin 3 times daily. 100 each 3  . isosorbide dinitrate (ISORDIL) 20 MG tablet Take 1 tablet (20 mg total) by mouth 2 (two) times daily. 180 tablet 3  . losartan (COZAAR) 50 MG tablet Take 1 tablet (50 mg total) by mouth every morning. 90 tablet 3  . metFORMIN (GLUCOPHAGE) 500 MG tablet Take 1 tablet (500 mg total) by mouth 2 (two) times daily with a meal. 60 tablet 2  . methocarbamol (ROBAXIN) 500 MG tablet Take 1 tablet (500 mg total) by mouth every 6 (six) hours as needed for muscle spasms. 60 tablet 0  . metoCLOPramide (REGLAN) 10 MG tablet Take 1 tablet (10 mg total) by mouth 3 (three) times daily. 270 tablet 3  . metolazone (ZAROXOLYN) 2.5 MG tablet Take 1 tablet (2.5 mg total) by mouth daily. 30 tablet 2    . Oxycodone HCl 20  MG TABS Take 1 tablet (20 mg total) by mouth every 3 (three) hours as needed (pain). 240 tablet 0  . OXYGEN Inhale 2 L into the lungs continuous.    . promethazine (PHENERGAN) 25 MG tablet Take 1 tablet (25 mg total) by mouth every 4 (four) hours as needed for nausea or vomiting. 120 tablet 2   No current facility-administered medications for this visit.    Allergies:   Acetaminophen; Morphine; Morphine and related; Other; Penicillins; Trazodone and nefazodone; Codeine phosphate; Hydrocodone-acetaminophen; Cephalexin; and Hydrocodone-acetaminophen    Social History:  The patient  reports that she quit smoking about 11 years ago. Her smoking use included Cigarettes. She has a 70 pack-year smoking history. She has never used smokeless tobacco. She reports that she does not drink alcohol or use illicit drugs.   Family History:  The patient's family history includes Cirrhosis in her father; Coronary artery disease in an other family member; Diabetes type II in her mother and another family member; Heart attack in her mother and sister. There is no history of Anesthesia problems, Hypotension, Malignant hyperthermia, Pseudochol deficiency, or Stroke.    ROS:  Please see the history of present illness.   Otherwise, review of systems are positive for leg and foot pain, leg swelling, blood in stool (in colostomy bag), depression muscle pain, fatigue.   All other systems are reviewed and negative.    PHYSICAL EXAM: VS:  BP 165/60 mmHg  Pulse 113  Ht 5' (1.524 m)  Wt 190 lb (86.183 kg)  BMI 37.11 kg/m2    Wt Readings from Last 3 Encounters:  03/08/14 190 lb (86.183 kg)  03/04/14 197 lb (89.359 kg)  03/02/14 205 lb (92.987 kg)     GEN: Well nourished, well developed, in no acute distress HEENT: normal Neck: no JVD, no masses Cardiac:  Normal S1/S2, RRR; no murmur, no rubs or gallops, trace bilateral ankle edema  Respiratory:  clear to auscultation bilaterally, no wheezing,  rhonchi or rales. GI: soft, diffuse tenderness to palpation, non-distended  MS: no deformity or atrophy; diffuse tenderness bilateral LE Skin: warm and dry  Neuro:  CNs II-XII intact  Psych: Flat affect   EKG:  EKG is ordered today.  It demonstrates:   Sinus tachy, HR 113, leftward axis, NSSTTW changes, QTc 504 ms   Recent Labs: 09/02/2013: Pro B Natriuretic peptide (BNP) 558.0* 01/04/2014: Magnesium 1.9; TSH 0.412 03/04/2014: ALT 44*; B Natriuretic Peptide 35.8; Hemoglobin 11.3*; Platelets 355 03/07/2014: BUN 54*; Creatinine 1.79*; Potassium 3.4*; Sodium 134*    Recent Labs  01/08/14 0510 01/26/14 1935 03/04/14 2100 03/07/14 1542  K 4.1 3.8 3.6 3.4*  BUN 13 25* 37* 54*  CREATININE 1.10 1.21* 1.55* 1.79*    Lipid Panel    Component Value Date/Time   CHOL 173 10/05/2013 0939   TRIG 86.0 10/05/2013 0939   HDL 45.10 10/05/2013 0939   CHOLHDL 4 10/05/2013 0939   VLDL 17.2 10/05/2013 0939   LDLCALC 111* 10/05/2013 0939      ASSESSMENT AND PLAN:  1.  Chronic Diastolic CHF:  She has lost 15 lbs since last week. She has continued to take Metolazone despite being told to DC this when seen in the ED by the cardiology fellow.  Recent BMET indicates AKI with pre-renal azotemia. She is over-diuresed.  I explained this to the patient.      -  DC Metolazone.    -  Hold Cozaar x 2 days, then resume.    -  Decrease  Lasix to 80 mg QD x 2 days, then resume Lasix 80 mg BID    -  Increase dietary K+ over the next 2 days.    -  BMET in 3-5 days. 2.  Abdominal Pain:  This is a chronic symptom.  She had colectomy and colostomy done at some point in the past and has continued to have pain since as well as blood in her ostomy bag.  Recent Hgb stable.  She had a CT scan that demonstrated an enlarged R adnexal structure (3.3 cm).  She has not had an oophorectomy.  This likely is her R ovary.  I am not sure if this is contributing to her increased abdominal pain or not.  But, I have asked her to FU with  primary care to evaluate. 3.  Hypertension:  BP above target.  She likely needs her medications adjusted. I'm not sure how much her pain is causing her BP to be elevated.  I will hold off on adjusting anything as is currently intravascularly depleted.  I will likely adjust her BP medications at FU.  4.  COPD:  FU with Pulmonology as planned. 5.  Chest Pain:  Recent Myoview low risk.  Question if symptoms related to bronchitis or COPD.  At this point, I would not repeat her stress test. 6.  S/p Pacemaker:  FU with EP as planned.    Current medicines are reviewed at length with the patient today.   The following changes have been made:  As above.    Labs/ tests ordered today include:  Orders Placed This Encounter  Procedures  . Basic Metabolic Panel (BMET)  . EKG 12-Lead     Disposition:   FU with me in 2 weeks.   Signed, Versie Starks, MHS 03/08/2014 11:51 AM    [facility] [address], [place], [place]  [address] Phone: [phone]; Fax: [phone]

## 2014-03-11 LAB — CULTURE, BLOOD (ROUTINE X 2)
Culture: NO GROWTH
Culture: NO GROWTH

## 2014-03-14 ENCOUNTER — Telehealth: Payer: Self-pay | Admitting: *Deleted

## 2014-03-14 ENCOUNTER — Other Ambulatory Visit (INDEPENDENT_AMBULATORY_CARE_PROVIDER_SITE_OTHER): Payer: Medicare Other | Admitting: *Deleted

## 2014-03-14 ENCOUNTER — Other Ambulatory Visit (HOSPITAL_COMMUNITY): Admission: AD | Admit: 2014-03-14 | Payer: Medicare Other | Source: Ambulatory Visit | Admitting: Internal Medicine

## 2014-03-14 DIAGNOSIS — I10 Essential (primary) hypertension: Secondary | ICD-10-CM

## 2014-03-14 DIAGNOSIS — N179 Acute kidney failure, unspecified: Secondary | ICD-10-CM

## 2014-03-14 DIAGNOSIS — I5031 Acute diastolic (congestive) heart failure: Secondary | ICD-10-CM

## 2014-03-14 DIAGNOSIS — I5032 Chronic diastolic (congestive) heart failure: Secondary | ICD-10-CM

## 2014-03-14 LAB — BASIC METABOLIC PANEL
BUN: 65 mg/dL — ABNORMAL HIGH (ref 6–23)
CALCIUM: 11.9 mg/dL — AB (ref 8.4–10.5)
CO2: 30 mEq/L (ref 19–32)
Chloride: 95 mEq/L — ABNORMAL LOW (ref 96–112)
Creatinine, Ser: 1.78 mg/dL — ABNORMAL HIGH (ref 0.40–1.20)
GFR: 30.68 mL/min — AB (ref >60.00)
Glucose, Bld: 141 mg/dL — ABNORMAL HIGH (ref 70–99)
POTASSIUM: 3.3 meq/L — AB (ref 3.5–5.1)
SODIUM: 138 meq/L (ref 135–145)

## 2014-03-14 MED ORDER — POTASSIUM CHLORIDE CRYS ER 20 MEQ PO TBCR: 40.0000 meq | EXTENDED_RELEASE_TABLET | Freq: Once | ORAL | Status: DC

## 2014-03-14 MED ORDER — METFORMIN HCL 500 MG PO TABS: 500.0000 mg | ORAL_TABLET | Freq: Two times a day (BID) | ORAL | Status: DC

## 2014-03-14 NOTE — Telephone Encounter (Signed)
DPR on file to sw/ daughter Joslyn Devon. Lmptcb on both pt's # and daughter-in-law Delane Ginger. Leanne aware of lab results and med changes, bmet 2/18 w/read back x 2 from [place]. K+ 20 meq 2 tabs x 1 sent in to pharmacy. Important pt to STOP METOLAZONE if she is still taking this. Daughter Leanne aware to have pt stop lasix, losartan and metformin. Leanne aware to have pt ask pcp when ok to resume metformin, Leanne states pt has appt Wed w/PCP. I thanked leanne for her time and that I know she does not live close however; if she could try to get this message to pt that would be great. I stated if any questions can call the office # and s/w On-call provider if needing any clarification, leanne said thank you for all of our help with her mom.

## 2014-03-16 ENCOUNTER — Other Ambulatory Visit: Payer: Self-pay | Admitting: Family Medicine

## 2014-03-16 ENCOUNTER — Ambulatory Visit (INDEPENDENT_AMBULATORY_CARE_PROVIDER_SITE_OTHER): Payer: Medicare Other | Admitting: Family Medicine

## 2014-03-16 ENCOUNTER — Encounter: Payer: Self-pay | Admitting: Family Medicine

## 2014-03-16 VITALS — BP 166/59 | HR 64 | Temp 98.1°F

## 2014-03-16 DIAGNOSIS — IMO0002 Reserved for concepts with insufficient information to code with codable children: Secondary | ICD-10-CM

## 2014-03-16 DIAGNOSIS — R1084 Generalized abdominal pain: Secondary | ICD-10-CM

## 2014-03-16 DIAGNOSIS — I5022 Chronic systolic (congestive) heart failure: Secondary | ICD-10-CM

## 2014-03-16 DIAGNOSIS — E1140 Type 2 diabetes mellitus with diabetic neuropathy, unspecified: Secondary | ICD-10-CM

## 2014-03-16 DIAGNOSIS — N838 Other noninflammatory disorders of ovary, fallopian tube and broad ligament: Secondary | ICD-10-CM

## 2014-03-16 DIAGNOSIS — E1165 Type 2 diabetes mellitus with hyperglycemia: Secondary | ICD-10-CM

## 2014-03-16 DIAGNOSIS — N839 Noninflammatory disorder of ovary, fallopian tube and broad ligament, unspecified: Secondary | ICD-10-CM

## 2014-03-16 DIAGNOSIS — I10 Essential (primary) hypertension: Secondary | ICD-10-CM

## 2014-03-16 MED ORDER — POTASSIUM CHLORIDE ER 10 MEQ PO TBCR: 10.0000 meq | EXTENDED_RELEASE_TABLET | Freq: Every day | ORAL | Status: AC

## 2014-03-16 NOTE — Progress Notes (Signed)
   Subjective:    Patient ID: [name], female    DOB: 1950/07/12, 64 y.o.   MRN: [id number]  HPI Here to discuss multiple issues. She was in the ED last week and was put on Metolazone for excess fluid and she only took this for 2-3 days. However this caused her to over diurese and she became dehydrated. She saw the Cardiology office and they discontinued the Metolazone. They out her back on Lasix 80 mg bid and gave her 2 days of potassium pills. On 03-04-14 her potassium level was 3.3, BUN was 65, and creatinine was 1.78. She has been taking metformin along with her insulin per Dr. Arman Berry instructions. However I am concerned about her renal insufficiency and her labile hydration status. Her glucoses range from 15 to 400. Today she feels very weak and "just not good". No more SOB then usual. She does complain of constant diffuse abdominal pains. She still has frequent blood in the stools. She had an abdominal and pelvic CT scan on 03-04-14 showing that a mass on the right ovary has increased in size from 2.1 cm to 3.3 cm and this needs to be followed up. She is in the process of getting in to see Dr. Earlean Berry for GI care but she needs to get his office her medical records before they will schedule a visit.    Review of Systems  Constitutional: Positive for fatigue. Negative for fever, chills and diaphoresis.  Respiratory: Positive for shortness of breath. Negative for cough and wheezing.   Cardiovascular: Positive for leg swelling. Negative for chest pain and palpitations.  Gastrointestinal: Positive for abdominal pain, blood in stool and abdominal distention. Negative for nausea, vomiting, diarrhea and constipation.       Objective:   Physical Exam  Constitutional: She is oriented to person, place, and time.  In pain, weak, crying   Cardiovascular: Normal rate, regular rhythm, normal heart sounds and intact distal pulses.   Pulmonary/Chest: Effort normal and breath sounds normal.    Abdominal: Soft. Bowel sounds are normal. She exhibits no distension and no mass. There is no rebound and no guarding.  Diffusely tender   Neurological: She is alert and oriented to person, place, and time.          Assessment & Plan:  We will further evaluate the ovarian mass by getting an US.she will start on Klor-con 10 mEq daily and we will closely follow this, given her renal status. I advised her to completely stop the metformin, and hopefully her glucoses can be managed with insulin alone. Hopefully she can get in to see Dr. Earlean Berry soon.

## 2014-03-16 NOTE — Progress Notes (Signed)
Pre visit review using our clinic review tool, if applicable. No additional management support is needed unless otherwise documented below in the visit note. 

## 2014-03-17 ENCOUNTER — Other Ambulatory Visit (INDEPENDENT_AMBULATORY_CARE_PROVIDER_SITE_OTHER): Payer: Medicare Other | Admitting: *Deleted

## 2014-03-17 ENCOUNTER — Telehealth: Payer: Self-pay | Admitting: Physician Assistant

## 2014-03-17 DIAGNOSIS — N179 Acute kidney failure, unspecified: Secondary | ICD-10-CM

## 2014-03-17 DIAGNOSIS — I5031 Acute diastolic (congestive) heart failure: Secondary | ICD-10-CM

## 2014-03-17 LAB — BASIC METABOLIC PANEL
BUN: 39 mg/dL — ABNORMAL HIGH (ref 6–23)
CO2: 30 mEq/L (ref 19–32)
Calcium: 11.1 mg/dL — ABNORMAL HIGH (ref 8.4–10.5)
Chloride: 97 mEq/L (ref 96–112)
Creatinine, Ser: 1.36 mg/dL — ABNORMAL HIGH (ref 0.40–1.20)
GFR: 41.86 mL/min — AB (ref >60.00)
Glucose, Bld: 223 mg/dL — ABNORMAL HIGH (ref 70–99)
POTASSIUM: 3.7 meq/L (ref 3.5–5.1)
Sodium: 134 mEq/L — ABNORMAL LOW (ref 135–145)

## 2014-03-17 NOTE — Telephone Encounter (Signed)
PT WAS TOLD TO CALL IF WEIGHT GAIN OVER 2 LBS, GAINED 4 LBS SINCE YESTERDAY, FEELS WEAK, OFF AND ON CHEST PAIN SINCE YESTERDAY, PLS ADVISE

## 2014-03-17 NOTE — Telephone Encounter (Signed)
I returned pt's call from earlier this morning. Pt began to cry and say she did not feel well, has weight gain of 4 lb's since yesterday, chest pain on and off x 1 week on a pain scale 1-10 with 10 being the worst she states chest pain at an 8 and last about 1/2 hour. At this point I stated I will have a Triage nurse cb to further go over pt's symptoms. Pt said ok and thank you which I then hung up with pt. At this point Rosaria Ferries, [place] who is in our office today with Richardson Dopp, PA offered to s/w pt so that we would not have to give call to Triage. I thanked Rosaria Ferries, PA for her help in taking the call. Please see below notes from Rosaria Ferries, PA where she s/w pt and gave her recommendations to help the pt.  Julaine Hua, CMA (AAMA)  Notes from Rosaria Ferries, PA below:  Pt called the office saying she did not feel well, weight was up and she has occasional chest pain.   Called patient and discussed issues with her.   1) Weight up 4 lbs overnight: Weight is OK, she was a little dry on last office visit. Weight has been up and down, so the 4 lb increase is only about 2 net pounds. Reassured her that she is OK, continue current medications. Continue to track her weight, call us if she continues to trend up. Watch sodium in foods she eats.  2) occasional chest pain - comes with and without exertion, really seems to be originating in her abdomen. She has a great deal of abdominal pain and has blood in her colostomy bag. [name] is coordinating evaluation. Encouraged her to follow up on things she needs to do for the evaluation, do not see a need for further cardiac evaluation with no exertional symptoms, negative stress test in the last year, and atypical pain.  3) general malaise - She feels very bad all the time. This am, her CBG was 45, she has eaten, pt rechecked while on the phone and it was over 300. Encouraged her to follow Dr Barbie Banner instructions for [facility] management.   She is extremely  anxious about the mass in her abdomen and cries frequently on the phone. This is likely contributing to/causing some of her symptoms. Encouraged her to let [name] know.

## 2014-03-18 ENCOUNTER — Telehealth: Payer: Self-pay | Admitting: *Deleted

## 2014-03-18 DIAGNOSIS — I5032 Chronic diastolic (congestive) heart failure: Secondary | ICD-10-CM

## 2014-03-18 MED ORDER — LOSARTAN POTASSIUM 50 MG PO TABS: 50.0000 mg | ORAL_TABLET | Freq: Every day | ORAL | Status: DC

## 2014-03-18 MED ORDER — FUROSEMIDE 40 MG PO TABS: 40.0000 mg | ORAL_TABLET | Freq: Every day | ORAL | Status: DC

## 2014-03-18 NOTE — Telephone Encounter (Signed)
pt notified of lab reuslts and to resume lasix 40 mg daily, cozaar 50 mg starting on Sunday 03/20/14, bmet 2/25. Pt agreeable to plan with understanding to instructions.

## 2014-03-23 ENCOUNTER — Ambulatory Visit
Admission: RE | Admit: 2014-03-23 | Discharge: 2014-03-23 | Disposition: A | Discharge status: Home / Self Care | Payer: Medicare Other | Source: Ambulatory Visit | Attending: Family Medicine | Admitting: Family Medicine

## 2014-03-23 DIAGNOSIS — N838 Other noninflammatory disorders of ovary, fallopian tube and broad ligament: Secondary | ICD-10-CM

## 2014-03-24 ENCOUNTER — Other Ambulatory Visit (INDEPENDENT_AMBULATORY_CARE_PROVIDER_SITE_OTHER): Payer: Medicare Other | Admitting: *Deleted

## 2014-03-24 DIAGNOSIS — I5032 Chronic diastolic (congestive) heart failure: Secondary | ICD-10-CM

## 2014-03-24 LAB — BASIC METABOLIC PANEL
BUN: 27 mg/dL — ABNORMAL HIGH (ref 6–23)
CALCIUM: 10.6 mg/dL — AB (ref 8.4–10.5)
CO2: 28 mEq/L (ref 19–32)
Chloride: 97 mEq/L (ref 96–112)
Creatinine, Ser: 1.32 mg/dL — ABNORMAL HIGH (ref 0.40–1.20)
GFR: 43.32 mL/min — ABNORMAL LOW (ref >60.00)
GLUCOSE: 254 mg/dL — AB (ref 70–99)
Potassium: 4.2 mEq/L (ref 3.5–5.1)
Sodium: 132 mEq/L — ABNORMAL LOW (ref 135–145)

## 2014-03-25 NOTE — Addendum Note (Signed)
Addended by: Alysia Penna A on: 03/25/2014 09:01 AM   Modules accepted: Orders

## 2014-03-28 ENCOUNTER — Encounter: Payer: Self-pay | Admitting: Obstetrics & Gynecology

## 2014-03-28 ENCOUNTER — Ambulatory Visit (INDEPENDENT_AMBULATORY_CARE_PROVIDER_SITE_OTHER): Payer: Medicare Other | Admitting: Obstetrics & Gynecology

## 2014-03-28 VITALS — BP 138/58 | HR 64 | Resp 12 | Ht 60.0 in | Wt 211.4 lb

## 2014-03-28 DIAGNOSIS — Z124 Encounter for screening for malignant neoplasm of cervix: Secondary | ICD-10-CM | POA: Diagnosis not present

## 2014-03-28 DIAGNOSIS — N839 Noninflammatory disorder of ovary, fallopian tube and broad ligament, unspecified: Secondary | ICD-10-CM

## 2014-03-28 DIAGNOSIS — D3911 Neoplasm of uncertain behavior of right ovary: Secondary | ICD-10-CM | POA: Diagnosis not present

## 2014-03-28 DIAGNOSIS — N838 Other noninflammatory disorders of ovary, fallopian tube and broad ligament: Secondary | ICD-10-CM

## 2014-03-28 NOTE — Progress Notes (Signed)
64 y.o. [name] DivorcedCaucasianF here for new patient appt.  Pt referred from [name] due to complex adnexal mass noted on recent PUS obtained 03/23/14.  Complex adnexal mass measuring 5.0 x 4.4 x 7.4cm.  Left ovary was not clearly seen.  No ascites or other masses noted.    Pt has had four CT scans done in the past few months--11/29/13, 01/04/14, 01/26/14, and 03/04/14.  CT scan in early February showed right ovarian cyst measuring 3.3cm which was mildly increased in size on previous CT where measuring 2.1cm.  All of CT scans have been done with ED visits when she has been seen due to abdominal pain.  Pt with colostomy that was placed after she had a colon perforation that occurred with colonoscopy.  Pt reports she is not having any trouble with her colostomy at this time.    She is accompanied by her daughter and son-in-law.  Was told after PUS that she has ovarian cancer so very anxious about this and what are the next steps.   Reviewed medical hx with pt.  H/O DM2, stage 4, non alcoholic, H/O stage 1B lung cancer diagnosis 2004.    No LMP recorded. Patient has had a hysterectomy.          Sexually active:  The current method of family planning is status post hysterectomy.    Exercising:  Smoker:  Former smoker-h/o lung cancer  Health Maintenance: Pap:  Prior to hysterectomy.  Pt retorts due to cervical cancer.  Has not had a pap smear in years.  MMG:  8/11 Colonoscopy:  2015-h/o colostomy  Past Medical History  Diagnosis Date  . Cirrhosis   . GERD (gastroesophageal reflux disease)   . Cervical disc syndrome     trouble turnng neck at times  . Chronic respiratory failure   . Diverticulitis of colon   . Perforation of colon   . IBS (irritable bowel syndrome)   . Chronic lower GI bleeding   . Overactive bladder   . Thrombocytopenia     sees Dr. Julien Nordmann   . Splenomegaly   . Depression   . Hypertension   . Asthma   . Heart murmur   . Hyperlipidemia   . COPD (chronic obstructive  pulmonary disease)     sees Dr. Gwenette Greet   . Colostomy care   . Hypercalcemia   . CHF (congestive heart failure)   . Pacemaker 12/14/2013  . 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 Cervical cancer many years ago  . Complication of anesthesia     woke up during colonscopy and endoscopy in past  . History of blood transfusion "several"    "bleeding via ostomy" (01/04/2014)  . On home oxygen therapy     "2L; 24/7" (01/04/2014)  . Pneumonia "1-2 times"  . Chronic bronchitis "several times"  . Sleep apnea 2012    mild, no cpap needed  . Type II diabetes mellitus     sees Dr. Cruzita Lederer   . Chronic disease anemia     sees Dr. Julien Nordmann, due to chronic disease and GI losses   . History of stomach ulcers   . Migraine     "last one was several years ago" (01/04/2014)  . Arthritis     "tailbone; hands; legs" (01/04/2014)  . Anxiety   . Pericardial effusion 2007, 2015.   [name] Chronic abdominal pain     IBS  . Cirrhosis of liver     stage  4    Past Surgical History  Procedure Laterality Date  . Colostomy  11/06/2005  . Lung removal, partial Left 2004    upper lobe removed  . Left colectomy  11/06/2005    Hartmann resection of sigmoid colon and end colostomy.  . Esophagogastroduodenoscopy  03/19/2011    Procedure: ESOPHAGOGASTRODUODENOSCOPY (EGD);  Surgeon: [name], MD;  Location: [facility];  Service: Endoscopy;  Laterality: N/A;  . Colonoscopy  03/19/2011    Procedure: COLONOSCOPY;  Surgeon: [name], MD;  Location: [facility];  Service: Endoscopy;  Laterality: N/A;  . Givens capsule study  03/20/2011    Procedure: GIVENS CAPSULE STUDY;  Surgeon: [name], MD;  Location: [facility];  Service: Endoscopy;  Laterality: N/A;  . Small bowel obstruction repair  March 2012  . Umbilical hernia repair  March 2012  . Splenectomy, total N/A 02/24/2013    Procedure: SPLENECTOMY;  Surgeon: Harl Bowie, MD;  Location: [facility];  Service: General;   Laterality: N/A;  . Orif patella Left 04/21/2013    Procedure: OPEN REDUCTION INTERNAL (ORIF) FIXATION PATELLA;  Surgeon: Augustin Schooling, MD;  Location: [facility];  Service: Orthopedics;  Laterality: Left;  . Pacemaker insertion  2015  . Esophagogastroduodenoscopy (egd) with propofol N/A 11/02/2013    Procedure: ESOPHAGOGASTRODUODENOSCOPY (EGD) WITH PROPOFOL;  Surgeon: [name], MD;  Location: [facility];  Service: Endoscopy;  Laterality: N/A;  EGD with APC  . Hot hemostasis N/A 11/02/2013    Procedure: HOT HEMOSTASIS (ARGON PLASMA COAGULATION/BICAP);  Surgeon: [name], MD;  Location: [facility];  Service: Endoscopy;  Laterality: N/A;  . Colon surgery    . Hernia repair    . Fracture surgery    . Abdominal hysterectomy    . Tubal ligation    . Tonsillectomy  1959  . Appendectomy  1962  . Cholecystectomy    . Cardiac catheterization  1967  . Permanent pacemaker insertion N/A 09/07/2013    Procedure: PERMANENT PACEMAKER INSERTION;  Surgeon: Evans Lance, MD;  Location: [facility] C[facility] LAB;  Service: Cardiovascular;  Laterality: N/A;  . Tee without cardioversion N/A 01/06/2014    Procedure: TRANSESOPHAGEAL ECHOCARDIOGRAM (TEE);  Surgeon: Lelon Perla, MD;  Location: [facility] ENDOSCOPY;  Service: Cardiovascular;  Laterality: N/A;  . Colostomy  2007    Current Outpatient Prescriptions  Medication Sig Dispense Refill  . albuterol (PROVENTIL) (2.5 MG/3ML) 0.083% nebulizer solution Take 3 mLs (2.5 mg total) by nebulization every 4 (four) hours as needed for wheezing or shortness of breath. 75 mL 12  . albuterol-ipratropium (COMBIVENT) 18-103 MCG/ACT inhaler Inhale 2 puffs into the lungs every 4 (four) hours as needed for wheezing or shortness of breath.     [name] atorvastatin (LIPITOR) 20 MG tablet Take 1 tablet (20 mg total) by mouth every morning. 90 tablet 3  . diazepam (VALIUM) 5 MG tablet Take 1 tablet (5 mg total) by mouth 3 (three) times daily as needed. 90 tablet 5  . dicyclomine  (BENTYL) 10 MG capsule Take 10 mg by mouth 3 (three) times daily.     [name] esomeprazole (NEXIUM) 40 MG capsule Take 1 capsule (40 mg total) by mouth every evening. 90 capsule 3  . Fluticasone-Salmeterol (ADVAIR) 250-50 MCG/DOSE AEPB Inhale 1 puff into the lungs every 12 (twelve) hours. 416 each 3  . folic acid (FOLVITE) 1 MG tablet Take 1 tablet (1 mg total) by mouth every evening. 90 tablet 3  . furosemide (LASIX) 40 MG tablet Take  1 tablet (40 mg total) by mouth daily.    . hydrALAZINE (APRESOLINE) 25 MG tablet Take 1.5 tablets (37.5 mg total) by mouth 2 (two) times daily. 270 tablet 3  . insulin regular human CONCENTRATED (HUMULIN R) 500 UNIT/ML SOLN injection Inject under skin 0.33 mL before b'fast, 0.33 mL before lunch and 0.30 mL before dinner 40 mL 2  . INSULIN SYRINGE 1CC/29G (B-D INSULIN SYRINGE) 29G X 1/2" 1 ML MISC Use to inject insulin 3 times daily. 100 each 3  . isosorbide dinitrate (ISORDIL) 20 MG tablet Take 1 tablet (20 mg total) by mouth 2 (two) times daily. 180 tablet 3  . losartan (COZAAR) 50 MG tablet Take 1 tablet (50 mg total) by mouth daily. 30 tablet 11  . methocarbamol (ROBAXIN) 500 MG tablet Take 1 tablet (500 mg total) by mouth every 6 (six) hours as needed for muscle spasms. 60 tablet 0  . metoCLOPramide (REGLAN) 10 MG tablet Take 1 tablet (10 mg total) by mouth 3 (three) times daily. 270 tablet 3  . Oxycodone HCl 20 MG TABS Take 1 tablet (20 mg total) by mouth every 3 (three) hours as needed (pain). 240 tablet 0  . OXYGEN Inhale 2 L into the lungs continuous.    . potassium chloride (KLOR-CON 10) 10 MEQ tablet Take 1 tablet (10 mEq total) by mouth daily. 30 tablet 11  . promethazine (PHENERGAN) 25 MG tablet Take 1 tablet (25 mg total) by mouth every 4 (four) hours as needed for nausea or vomiting. 120 tablet 2   No current facility-administered medications for this visit.    Family History  Problem Relation Age of Onset  . Coronary artery disease    . Diabetes type  II    . Heart attack Mother   . Diabetes type II Mother   . Cirrhosis Father   . Heart attack Sister   . Pancreatic cancer Mother     secondary from surgery  . Cervical cancer Maternal Grandmother   . Hypertension Father   . Hypertension Sister   . Hypertension Son   . Hypertension Daughter     ROS:  Pertinent items are noted in HPI.  Otherwise, a comprehensive ROS was negative.  Exam:   General appearance: alert, cooperative and appears older than stated age Head: Normocephalic, without obvious abnormality, atraumatic Breasts: normal appearance, no masses or tenderness Heart: regular rate and rhythm Abdomen: soft, diffuse tenderness with R slightly> L, bowel sounds normal; no masses, coloscopy present with stool in bag Extremities: extremities normal, atraumatic, mild edema Skin: Skin color, texture, turgor normal. No rashes or lesions Lymph nodes:  No abnormal inguinal nodes palpated Neurologic: Grossly normal  Pelvic: External genitalia:  no lesions              Urethra:  normal appearing urethra with no masses, tenderness or lesions              Bartholins and Skenes: normal                 Vagina: normal appearing vagina with normal color and discharge, no lesions              Cervix: absent              Pap taken: Yes.   Bimanual Exam:  Uterus:  uterus absent              Adnexa: no mass, fullness, tenderness  Chaperone was present for exam.  A:  7cm complex right adnexa mass.  CT and ultrasound findings not classic for ovarian cancer H/O cirrhosis H/O IB lung cancer 2004, Left upper lobe removed Type 2 DM H/O TAH due to cervical cancer ? CHF  P:   Ca-125.  Pt aware I will most likely refer to gyn oncology for further management and/or monitoring, depending on results Pap obtained today  ~In excess of 60 minutes spent with patient >50% of time was in face to face discussion of prior imaging, examination today, ca-125, and further evaluation.  Daughter  present for all of discussion.  All questions answered.

## 2014-03-29 LAB — IPS PAP TEST WITH HPV

## 2014-03-29 LAB — CA 125: CA 125: 29 U/mL (ref <35)

## 2014-03-30 ENCOUNTER — Ambulatory Visit (INDEPENDENT_AMBULATORY_CARE_PROVIDER_SITE_OTHER): Payer: Medicare Other | Admitting: Physician Assistant

## 2014-03-30 ENCOUNTER — Encounter: Payer: Self-pay | Admitting: Physician Assistant

## 2014-03-30 ENCOUNTER — Telehealth: Payer: Self-pay | Admitting: *Deleted

## 2014-03-30 VITALS — BP 130/58 | HR 83 | Ht 60.0 in | Wt 210.0 lb

## 2014-03-30 DIAGNOSIS — I10 Essential (primary) hypertension: Secondary | ICD-10-CM

## 2014-03-30 DIAGNOSIS — R079 Chest pain, unspecified: Secondary | ICD-10-CM

## 2014-03-30 DIAGNOSIS — I5033 Acute on chronic diastolic (congestive) heart failure: Secondary | ICD-10-CM

## 2014-03-30 DIAGNOSIS — Z950 Presence of cardiac pacemaker: Secondary | ICD-10-CM

## 2014-03-30 DIAGNOSIS — R1084 Generalized abdominal pain: Secondary | ICD-10-CM

## 2014-03-30 DIAGNOSIS — J449 Chronic obstructive pulmonary disease, unspecified: Secondary | ICD-10-CM

## 2014-03-30 DIAGNOSIS — R0602 Shortness of breath: Secondary | ICD-10-CM

## 2014-03-30 LAB — BASIC METABOLIC PANEL
BUN: 33 mg/dL — AB (ref 6–23)
CHLORIDE: 100 meq/L (ref 96–112)
CO2: 27 mEq/L (ref 19–32)
Calcium: 10.9 mg/dL — ABNORMAL HIGH (ref 8.4–10.5)
Creatinine, Ser: 1.36 mg/dL — ABNORMAL HIGH (ref 0.40–1.20)
GFR: 41.85 mL/min — AB (ref >60.00)
GLUCOSE: 198 mg/dL — AB (ref 70–99)
POTASSIUM: 3.9 meq/L (ref 3.5–5.1)
SODIUM: 135 meq/L (ref 135–145)

## 2014-03-30 LAB — BRAIN NATRIURETIC PEPTIDE: PRO B NATRI PEPTIDE: 71 pg/mL (ref 0.0–100.0)

## 2014-03-30 MED ORDER — FUROSEMIDE 40 MG PO TABS: 80.0000 mg | ORAL_TABLET | Freq: Two times a day (BID) | ORAL | Status: DC

## 2014-03-30 NOTE — Telephone Encounter (Signed)
pt notified of lab results and to only take increased dose of lasix for 3 days and not 1 week as discussed at [place] today with PA. Pt states understanding to Plan of care

## 2014-03-30 NOTE — Progress Notes (Signed)
Cardiology Office Note   Date:  03/30/2014   ID:  LAVELLE BERLAND, DOB 1950-02-22, MRN [id number]  PCP:  [name], MD  Cardiologist/Electrophysiologist:  Dr. Cristopher Peru   Chief Complaint  Patient presents with  . Shortness of Breath  . Follow-up     History of Present Illness: [name] is a 64 y.o. female with a hx of symptomatic sinus node dysfunction s/p dual chamber PPM 08/2013, HTN, HL, DM, cirrhosis, chronic anemia 2/2 to GI bleed, COPD on home O2, OSA, diastolic HF.  She had a perforated colon in the past during colonoscopy in the setting of diverticulitis requiring colectomy and colostomy. She has noted occasional blood in her colostomy bag for 3 years.    Admitted with Gram + bacteremia in 12/2013.  There was concern that her pacemaker lead may need to be extracted.  Her TEE was neg for vegetation.  She was therefore treated with antibiotics.    FU with Dr. Cristopher Peru 03/02/14.  She had worsening symptoms of volume excess and Metolazone was added to her regimen.  She c/o chest pain thought to be related to volume overload.    Seen in ED 2/6 by the cardiology fellow with complaints of abdominal pain, LE edema, dyspnea.  She had lost 8 lbs since starting Metolazone, but developed severe abdominal cramping.  CXR demonstrated some haziness on the L base, but o/w clear.  BNP normal (35.8).  She appeared to be euvolemic.  BUN and Cr was elevated. Metolazone was DC'd.  FU as OP was recommended and she was DC'd to home.   I saw her 03/08/14.  She had multiple complaints.  She c/o DOE with minimal activity (NYHA 3).  Weight was down 15 lbs since the prior week.  BMET indicated pre-renal azotemia and worsening creatinine.   She was still taking Metolazone.  I asked her to stop this.  A recent CT demonstrated an enlarged R adnexal mass.  She FU with PCP and transvaginal US demonstrated 5 cm R adnexal mass concerning for malignancy.  She has seen gyn this week.  She returns for  FU.  She has chronic dyspnea without much change.  She has occasional chest pain.  This seems to be related to wheezing and is relieved with nebulizer use.  She denies syncope.   She sleeps on an incline chronically.  She notes worsening LE edema.      Studies/Reports Reviewed Today:  TE-Echocardiogram 01/06/14 - EF 55% to 60%. Wall motion was normal. - Left atrium: The atrium was mildly dilated. No LA or LAA clot - Atrial septum: There was a patent foramen ovale. - Trace MR and TR - No valvular vegetation  Nuclear Stress Test 06/2013 Low risk nuclear stress test. No evidence of ischemia.  Normal ejection fraction of 63%.  2D Echocardiogram 07/13/13 - Mild focal basal hypertrophy of the septum. EF 60% to 65%.  Wall motion was normal. Grade 2 diastolic dysfunction. - Left atrium: The atrium was moderately dilated. - Pericardium, extracardiac: A small to moderate pericardial effusion circumferential to the heart. No evidence of hemodynamic compromise.  Abdominal CT 03/04/14 1. No definite acute abnormality in the abdomen or pelvis. 2. Increased size of right adnexal cystic structure, currently 3.3 cm, previously 2.1 cm. This could represent the right ovary if the ovary remains in situ. If there has been prior oophorectomy, the etiology is uncertain. This could represent peritoneal inclusion cyst. Correlation with surgical history is recommended. Non-emergent pelvic ultrasound  could be considered for further evaluation. 3. Stable chronic findings include cirrhotic hepatic morphology, benign-appearing pancreatic cystic lesions, and left lower quadrant parastomal hernia.  Transvaginal US 03/23/14 IMPRESSION: Surgical absence of uterus. Nonvisualization of LEFT ovary. 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 Gynecologic evaluation recommended.    Past Medical History  Diagnosis Date  .  Cirrhosis   . GERD (gastroesophageal reflux disease)   . Cervical disc syndrome     trouble turnng neck at times  . Chronic respiratory failure   . Diverticulitis of colon   . Perforation of colon   . IBS (irritable bowel syndrome)   . Chronic lower GI bleeding   . Overactive bladder   . Thrombocytopenia     sees Dr. Julien Nordmann   . Splenomegaly   . Depression   . Hypertension   . Asthma   . Heart murmur   . Hyperlipidemia   . COPD (chronic obstructive pulmonary disease)     sees Dr. Gwenette Greet   . Colostomy care   . Hypercalcemia   . CHF (congestive heart failure)   . Pacemaker 12/14/2013  . 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 Cervical cancer many years ago  . Complication of anesthesia     woke up during colonscopy and endoscopy in past  . History of blood transfusion "several"    "bleeding via ostomy" (01/04/2014)  . On home oxygen therapy     "2L; 24/7" (01/04/2014)  . Pneumonia "1-2 times"  . Chronic bronchitis "several times"  . Sleep apnea 2012    mild, no cpap needed  . Type II diabetes mellitus     sees Dr. Cruzita Lederer   . Chronic disease anemia     sees Dr. Julien Nordmann, due to chronic disease and GI losses   . History of stomach ulcers   . Migraine     "last one was several years ago" (01/04/2014)  . Arthritis     "tailbone; hands; legs" (01/04/2014)  . Anxiety   . Pericardial effusion 2007, 2015.   [name] Chronic abdominal pain     IBS  . Cirrhosis of liver     stage 4    Past Surgical History  Procedure Laterality Date  . Colostomy  11/06/2005  . Lung removal, partial Left 2004    upper lobe removed  . Left colectomy  11/06/2005    Hartmann resection of sigmoid colon and end colostomy.  . Esophagogastroduodenoscopy  03/19/2011    Procedure: ESOPHAGOGASTRODUODENOSCOPY (EGD);  Surgeon: [name], MD;  Location: [facility];  Service: Endoscopy;  Laterality: N/A;  . Colonoscopy  03/19/2011    Procedure: COLONOSCOPY;  Surgeon: [name], MD;  Location: [facility];  Service: Endoscopy;  Laterality: N/A;  . Givens capsule study  03/20/2011    Procedure: GIVENS CAPSULE STUDY;  Surgeon: [name], MD;  Location: [facility];  Service: Endoscopy;  Laterality: N/A;  . Small bowel obstruction repair  March 2012  . Umbilical hernia repair  March 2012  . Splenectomy, total N/A 02/24/2013    Procedure: SPLENECTOMY;  Surgeon: Harl Bowie, MD;  Location: [facility];  Service: General;  Laterality: N/A;  . Orif patella Left 04/21/2013    Procedure: OPEN REDUCTION INTERNAL (ORIF) FIXATION PATELLA;  Surgeon: Augustin Schooling, MD;  Location: [facility];  Service: Orthopedics;  Laterality: Left;  . Pacemaker  insertion  2015  . Esophagogastroduodenoscopy (egd) with propofol N/A 11/02/2013    Procedure: ESOPHAGOGASTRODUODENOSCOPY (EGD) WITH PROPOFOL;  Surgeon: [name], MD;  Location: [facility];  Service: Endoscopy;  Laterality: N/A;  EGD with APC  . Hot hemostasis N/A 11/02/2013    Procedure: HOT HEMOSTASIS (ARGON PLASMA COAGULATION/BICAP);  Surgeon: [name], MD;  Location: [facility];  Service: Endoscopy;  Laterality: N/A;  . Colon surgery    . Hernia repair    . Fracture surgery    . Abdominal hysterectomy    . Tubal ligation    . Tonsillectomy  1959  . Appendectomy  1962  . Cholecystectomy    . Cardiac catheterization  1967  . Permanent pacemaker insertion N/A 09/07/2013    Procedure: PERMANENT PACEMAKER INSERTION;  Surgeon: Evans Lance, MD;  Location: [facility] CATH LAB;  Service: Cardiovascular;  Laterality: N/A;  . Tee without cardioversion N/A 01/06/2014    Procedure: TRANSESOPHAGEAL ECHOCARDIOGRAM (TEE);  Surgeon: Lelon Perla, MD;  Location: [facility] ENDOSCOPY;  Service: Cardiovascular;  Laterality: N/A;  . Colostomy  2007     Current Outpatient Prescriptions  Medication Sig Dispense Refill  . albuterol (PROVENTIL) (2.5 MG/3ML) 0.083% nebulizer solution Take 3 mLs (2.5 mg total) by nebulization every 4  (four) hours as needed for wheezing or shortness of breath. 75 mL 12  . albuterol-ipratropium (COMBIVENT) 18-103 MCG/ACT inhaler Inhale 2 puffs into the lungs every 4 (four) hours as needed for wheezing or shortness of breath.     [name] atorvastatin (LIPITOR) 20 MG tablet Take 1 tablet (20 mg total) by mouth every morning. 90 tablet 3  . diazepam (VALIUM) 5 MG tablet Take 1 tablet (5 mg total) by mouth 3 (three) times daily as needed. 90 tablet 5  . dicyclomine (BENTYL) 10 MG capsule Take 10 mg by mouth 3 (three) times daily.     [name] esomeprazole (NEXIUM) 40 MG capsule Take 1 capsule (40 mg total) by mouth every evening. 90 capsule 3  . Fluticasone-Salmeterol (ADVAIR) 250-50 MCG/DOSE AEPB Inhale 1 puff into the lungs every 12 (twelve) hours. 790 each 3  . folic acid (FOLVITE) 1 MG tablet Take 1 tablet (1 mg total) by mouth every evening. 90 tablet 3  . furosemide (LASIX) 40 MG tablet Take 1 tablet (40 mg total) by mouth daily. (Patient taking differently: Take 40 mg by mouth 2 (two) times daily. )    . hydrALAZINE (APRESOLINE) 25 MG tablet Take 1.5 tablets (37.5 mg total) by mouth 2 (two) times daily. 270 tablet 3  . insulin regular human CONCENTRATED (HUMULIN R) 500 UNIT/ML SOLN injection Inject under skin 0.33 mL before b'fast, 0.33 mL before lunch and 0.30 mL before dinner 40 mL 2  . INSULIN SYRINGE 1CC/29G (B-D INSULIN SYRINGE) 29G X 1/2" 1 ML MISC Use to inject insulin 3 times daily. 100 each 3  . isosorbide dinitrate (ISORDIL) 20 MG tablet Take 1 tablet (20 mg total) by mouth 2 (two) times daily. 180 tablet 3  . losartan (COZAAR) 50 MG tablet Take 1 tablet (50 mg total) by mouth daily. 30 tablet 11  . methocarbamol (ROBAXIN) 500 MG tablet Take 1 tablet (500 mg total) by mouth every 6 (six) hours as needed for muscle spasms. 60 tablet 0  . metoCLOPramide (REGLAN) 10 MG tablet Take 1 tablet (10 mg total) by mouth 3 (three) times daily. 270 tablet 3  . Oxycodone HCl 20 MG TABS Take 1 tablet (20 mg  total) by mouth every  3 (three) hours as needed (pain). 240 tablet 0  . OXYGEN Inhale 2 L into the lungs continuous.    . potassium chloride (KLOR-CON 10) 10 MEQ tablet Take 1 tablet (10 mEq total) by mouth daily. 30 tablet 11  . promethazine (PHENERGAN) 25 MG tablet Take 1 tablet (25 mg total) by mouth every 4 (four) hours as needed for nausea or vomiting. 120 tablet 2   No current facility-administered medications for this visit.    Allergies:   Acetaminophen; Morphine; Morphine and related; Other; Penicillins; Trazodone and nefazodone; Codeine phosphate; Hydrocodone-acetaminophen; Cephalexin; and Hydrocodone-acetaminophen    Social History:  The patient  reports that she quit smoking about 12 years ago. Her smoking use included Cigarettes. She has a 70 pack-year smoking history. She has never used smokeless tobacco. She reports that she does not drink alcohol or use illicit drugs.   Family History:  The patient's family history includes Cervical cancer in her maternal grandmother; Cirrhosis in her father; Coronary artery disease in an other family member; Diabetes type II in her mother and another family member; Heart attack in her mother and sister; Hypertension in her daughter, father, sister, and son; Pancreatic cancer in her mother. There is no history of Stroke.    ROS:  Please see the history of present illness.    Review of Systems  Constitution: Positive for malaise/fatigue.  HENT: Positive for headaches.   Gastrointestinal: Positive for abdominal pain, constipation, hematochezia and nausea.  Neurological: Positive for loss of balance.  Psychiatric/Behavioral: The patient is nervous/anxious.   All other systems reviewed and are negative.    PHYSICAL EXAM: VS:  BP 130/58 mmHg  Pulse 83  Ht 5' (1.524 m)  Wt 210 lb (95.255 kg)  BMI 41.01 kg/m2    Wt Readings from Last 3 Encounters:  03/30/14 210 lb (95.255 kg)  03/28/14 211 lb 6.4 oz (95.89 kg)  03/08/14 190 lb (86.183 kg)      GEN: chronically ill appearing female, in no acute distress HEENT: normal Neck: I cannot appreciate JVD, no masses Cardiac:  Normal S1/S2, RRR; no murmur, no rubs or gallops, 1+ bilateral LE edema  Respiratory:  clear to auscultation bilaterally, no wheezing, rhonchi or rales. GI: soft, diffuse tenderness to palpation  MS: no deformity or atrophy; diffuse tenderness bilateral LE Skin: warm and dry  Neuro:  CNs II-XII intact  Psych: Flat affect   EKG:  EKG is ordered today.  It demonstrates:   NSR, HR 83, normal axis, no ST changes   Recent Labs: 09/02/2013: Pro B Natriuretic peptide (BNP) 558.0* 01/04/2014: Magnesium 1.9; TSH 0.412 03/04/2014: ALT 44*; B Natriuretic Peptide 35.8; Hemoglobin 11.3*; Platelets 355 03/24/2014: BUN 27*; Creatinine 1.32*; Potassium 4.2; Sodium 132*    Recent Labs  03/07/14 1542 03/14/14 1321 03/17/14 0919 03/24/14 1116  K 3.4* 3.3* 3.7 4.2  BUN 54* 65* 39* 27*  CREATININE 1.79* 1.78* 1.36* 1.32*    Lipid Panel    Component Value Date/Time   CHOL 173 10/05/2013 0939   TRIG 86.0 10/05/2013 0939   HDL 45.10 10/05/2013 0939   CHOLHDL 4 10/05/2013 0939   VLDL 17.2 10/05/2013 0939   LDLCALC 111* 10/05/2013 0939      ASSESSMENT AND PLAN:  1.  Acute on Chronic Diastolic CHF:  Her weight is up and she has some increased LE edema.      -  Increase Lasix to 120 mg Twice daily     -  BMET, BNP today.  Repeat  BMET 1 week.   2.  Abdominal Pain:  This is a chronic symptom.  She had colectomy and colostomy done at some point in the past and has continued to have pain since as well as blood in her ostomy bag.  Recent Hgb stable.  She had a CT scan that demonstrated an enlarged R adnexal structure (3.3 cm).  She has not had an oophorectomy.  This likely is her R ovary.  Recent CEA not elevated.  She is followed by Gyn. 3.  Hypertension:  Controlled.  4.  COPD:  FU with Pulmonology as planned.  5.  Chest Pain:  Recent Myoview low risk.  Question if  symptoms related to bronchitis or COPD.  At this point, I would not repeat her stress test.  If she requires extensive surgery, will need to consider repeating stress myoview for risk stratification.  She cannot achieve 4 METs. 6.  S/p Pacemaker:  FU with EP as planned.      Current medicines are reviewed at length with the patient today.   The following changes have been made:  As above.    Labs/ tests ordered today include:   Orders Placed This Encounter  Procedures  . Basic Metabolic Panel (BMET)  . B Nat Peptide  . Basic Metabolic Panel (BMET)  . EKG 12-Lead    Disposition:   FU with me 3 mos.   Signed, Versie Starks, MHS 03/30/2014 12:06 PM    [facility] [place], [place], [place]  [address] Phone: [phone]; Fax: [phone]

## 2014-03-30 NOTE — Patient Instructions (Addendum)
Your physician recommends that you schedule a follow-up appointment in: [place], PAC SAME DAY DR. Lovena Le IS IN THE OFFICE  WHEN YOU GET HOME TODAY CALL us BACK TO LET us KNOW EXACTLY HOW MUCH LASIX YOU HAVE BEEN TAKING [phone]; THEN WE WILL GO OVER WHAT CHANGES NEED TO BE MADE WITH YOUR LASIX  INCREASE LASIX TO 120 MG TWICE DAILY FOR 1 WEEK THEN GO BACK TO LASIX 80 MG TWICE DAILY  LAB WORK TODAY; BMET, BNP  REPEAT BMET IN 1 WEEK

## 2014-04-01 ENCOUNTER — Telehealth: Payer: Self-pay | Admitting: *Deleted

## 2014-04-01 NOTE — Telephone Encounter (Signed)
Call to patient. Notified of appointment and instructions given. She is familiar with this location since her oncologist is there. Patient is agreeable to appointment date and time.  Routing to provider for final review. Patient agreeable to disposition. Will close encounter

## 2014-04-01 NOTE — Telephone Encounter (Signed)
Pt has questions regarding multiple follow up appointments. Pt is anxious for a return call. Pt saw Dr Sabra Heck last week.

## 2014-04-01 NOTE — Telephone Encounter (Signed)
Call to [facility], spoke with Maudie Mercury. Appointment scheduled with Dr Denman George for 04-11-14 at 32, arrive at 200 for registration.

## 2014-04-06 ENCOUNTER — Other Ambulatory Visit (INDEPENDENT_AMBULATORY_CARE_PROVIDER_SITE_OTHER): Payer: Medicare Other | Admitting: *Deleted

## 2014-04-06 ENCOUNTER — Telehealth: Payer: Self-pay | Admitting: *Deleted

## 2014-04-06 DIAGNOSIS — I509 Heart failure, unspecified: Secondary | ICD-10-CM | POA: Diagnosis not present

## 2014-04-06 DIAGNOSIS — R0602 Shortness of breath: Secondary | ICD-10-CM | POA: Diagnosis not present

## 2014-04-06 LAB — BASIC METABOLIC PANEL
BUN: 32 mg/dL — AB (ref 6–23)
CO2: 28 meq/L (ref 19–32)
Calcium: 10.9 mg/dL — ABNORMAL HIGH (ref 8.4–10.5)
Chloride: 103 mEq/L (ref 96–112)
Creatinine, Ser: 1.54 mg/dL — ABNORMAL HIGH (ref 0.40–1.20)
GFR: 36.26 mL/min — ABNORMAL LOW (ref >60.00)
GLUCOSE: 283 mg/dL — AB (ref 70–99)
POTASSIUM: 4.6 meq/L (ref 3.5–5.1)
SODIUM: 136 meq/L (ref 135–145)

## 2014-04-06 NOTE — Telephone Encounter (Signed)
pt notified about lab results with verbal understanding  

## 2014-04-08 ENCOUNTER — Telehealth: Payer: Self-pay | Admitting: Family Medicine

## 2014-04-08 NOTE — Telephone Encounter (Signed)
[facility], jennifer, called for a verbal  A1c for pt.   They need one every 90 days, and the last one listed is  01/04/14.

## 2014-04-08 NOTE — Telephone Encounter (Signed)
Per [name] okay to give verbal order and I did speak with Anderson Malta and gave the verbal.

## 2014-04-11 ENCOUNTER — Encounter (HOSPITAL_COMMUNITY): Payer: Self-pay

## 2014-04-11 ENCOUNTER — Emergency Department (HOSPITAL_COMMUNITY): Payer: Medicare Other

## 2014-04-11 ENCOUNTER — Telehealth: Payer: Self-pay | Admitting: Gynecologic Oncology

## 2014-04-11 ENCOUNTER — Telehealth: Payer: Self-pay | Admitting: *Deleted

## 2014-04-11 ENCOUNTER — Other Ambulatory Visit (HOSPITAL_COMMUNITY)
Admission: RE | Admit: 2014-04-11 | Discharge: 2014-04-11 | Disposition: A | Discharge status: Home / Self Care | Payer: Medicare Other | Source: Ambulatory Visit | Attending: Gynecologic Oncology | Admitting: Gynecologic Oncology

## 2014-04-11 ENCOUNTER — Telehealth: Payer: Self-pay | Admitting: Family Medicine

## 2014-04-11 ENCOUNTER — Other Ambulatory Visit: Payer: Self-pay

## 2014-04-11 ENCOUNTER — Inpatient Hospital Stay (HOSPITAL_COMMUNITY)
Admission: EM | Admit: 2014-04-11 | Discharge: 2014-04-21 | DRG: 442 | Disposition: A | Discharge status: Home Health | Payer: Medicare Other | Attending: Internal Medicine | Admitting: Internal Medicine

## 2014-04-11 ENCOUNTER — Ambulatory Visit (HOSPITAL_BASED_OUTPATIENT_CLINIC_OR_DEPARTMENT_OTHER): Payer: Medicare Other | Admitting: Gynecologic Oncology

## 2014-04-11 ENCOUNTER — Other Ambulatory Visit: Payer: Medicare Other

## 2014-04-11 ENCOUNTER — Encounter: Payer: Self-pay | Admitting: Gynecologic Oncology

## 2014-04-11 VITALS — BP 137/50 | HR 62 | Temp 98.4°F | Resp 18 | Ht 60.0 in | Wt 212.1 lb

## 2014-04-11 DIAGNOSIS — F339 Major depressive disorder, recurrent, unspecified: Secondary | ICD-10-CM | POA: Diagnosis present

## 2014-04-11 DIAGNOSIS — K7460 Unspecified cirrhosis of liver: Secondary | ICD-10-CM | POA: Diagnosis present

## 2014-04-11 DIAGNOSIS — N8320 Unspecified ovarian cysts: Secondary | ICD-10-CM

## 2014-04-11 DIAGNOSIS — I10 Essential (primary) hypertension: Secondary | ICD-10-CM | POA: Diagnosis present

## 2014-04-11 DIAGNOSIS — E1140 Type 2 diabetes mellitus with diabetic neuropathy, unspecified: Secondary | ICD-10-CM | POA: Diagnosis present

## 2014-04-11 DIAGNOSIS — I129 Hypertensive chronic kidney disease with stage 1 through stage 4 chronic kidney disease, or unspecified chronic kidney disease: Secondary | ICD-10-CM | POA: Diagnosis present

## 2014-04-11 DIAGNOSIS — R400 Somnolence: Secondary | ICD-10-CM

## 2014-04-11 DIAGNOSIS — A09 Infectious gastroenteritis and colitis, unspecified: Secondary | ICD-10-CM | POA: Diagnosis present

## 2014-04-11 DIAGNOSIS — R1084 Generalized abdominal pain: Secondary | ICD-10-CM

## 2014-04-11 DIAGNOSIS — K435 Parastomal hernia without obstruction or  gangrene: Secondary | ICD-10-CM | POA: Diagnosis not present

## 2014-04-11 DIAGNOSIS — K7290 Hepatic failure, unspecified without coma: Secondary | ICD-10-CM | POA: Diagnosis not present

## 2014-04-11 DIAGNOSIS — K7581 Nonalcoholic steatohepatitis (NASH): Secondary | ICD-10-CM | POA: Diagnosis present

## 2014-04-11 DIAGNOSIS — Z8541 Personal history of malignant neoplasm of cervix uteri: Secondary | ICD-10-CM

## 2014-04-11 DIAGNOSIS — Z950 Presence of cardiac pacemaker: Secondary | ICD-10-CM

## 2014-04-11 DIAGNOSIS — N390 Urinary tract infection, site not specified: Secondary | ICD-10-CM | POA: Diagnosis present

## 2014-04-11 DIAGNOSIS — Z87891 Personal history of nicotine dependence: Secondary | ICD-10-CM

## 2014-04-11 DIAGNOSIS — Z8049 Family history of malignant neoplasm of other genital organs: Secondary | ICD-10-CM

## 2014-04-11 DIAGNOSIS — Z9049 Acquired absence of other specified parts of digestive tract: Secondary | ICD-10-CM | POA: Diagnosis present

## 2014-04-11 DIAGNOSIS — R109 Unspecified abdominal pain: Secondary | ICD-10-CM

## 2014-04-11 DIAGNOSIS — Z86010 Personal history of colonic polyps: Secondary | ICD-10-CM

## 2014-04-11 DIAGNOSIS — Z880 Allergy status to penicillin: Secondary | ICD-10-CM

## 2014-04-11 DIAGNOSIS — Z85118 Personal history of other malignant neoplasm of bronchus and lung: Secondary | ICD-10-CM

## 2014-04-11 DIAGNOSIS — Z8249 Family history of ischemic heart disease and other diseases of the circulatory system: Secondary | ICD-10-CM

## 2014-04-11 DIAGNOSIS — K862 Cyst of pancreas: Secondary | ICD-10-CM | POA: Diagnosis present

## 2014-04-11 DIAGNOSIS — F419 Anxiety disorder, unspecified: Secondary | ICD-10-CM | POA: Diagnosis present

## 2014-04-11 DIAGNOSIS — E785 Hyperlipidemia, unspecified: Secondary | ICD-10-CM | POA: Diagnosis present

## 2014-04-11 DIAGNOSIS — Z902 Acquired absence of lung [part of]: Secondary | ICD-10-CM | POA: Diagnosis present

## 2014-04-11 DIAGNOSIS — Z90710 Acquired absence of both cervix and uterus: Secondary | ICD-10-CM

## 2014-04-11 DIAGNOSIS — N179 Acute kidney failure, unspecified: Secondary | ICD-10-CM | POA: Diagnosis present

## 2014-04-11 DIAGNOSIS — Z808 Family history of malignant neoplasm of other organs or systems: Secondary | ICD-10-CM

## 2014-04-11 DIAGNOSIS — Z9081 Acquired absence of spleen: Secondary | ICD-10-CM | POA: Diagnosis present

## 2014-04-11 DIAGNOSIS — Z9981 Dependence on supplemental oxygen: Secondary | ICD-10-CM

## 2014-04-11 DIAGNOSIS — K56600 Partial intestinal obstruction, unspecified as to cause: Secondary | ICD-10-CM | POA: Diagnosis not present

## 2014-04-11 DIAGNOSIS — N83201 Unspecified ovarian cyst, right side: Secondary | ICD-10-CM

## 2014-04-11 DIAGNOSIS — Z833 Family history of diabetes mellitus: Secondary | ICD-10-CM

## 2014-04-11 DIAGNOSIS — Z881 Allergy status to other antibiotic agents status: Secondary | ICD-10-CM

## 2014-04-11 DIAGNOSIS — E1165 Type 2 diabetes mellitus with hyperglycemia: Secondary | ICD-10-CM | POA: Diagnosis present

## 2014-04-11 DIAGNOSIS — E860 Dehydration: Secondary | ICD-10-CM | POA: Diagnosis present

## 2014-04-11 DIAGNOSIS — K589 Irritable bowel syndrome without diarrhea: Secondary | ICD-10-CM | POA: Diagnosis present

## 2014-04-11 DIAGNOSIS — D638 Anemia in other chronic diseases classified elsewhere: Secondary | ICD-10-CM | POA: Diagnosis present

## 2014-04-11 DIAGNOSIS — J9610 Chronic respiratory failure, unspecified whether with hypoxia or hypercapnia: Secondary | ICD-10-CM | POA: Diagnosis present

## 2014-04-11 DIAGNOSIS — N189 Chronic kidney disease, unspecified: Secondary | ICD-10-CM | POA: Diagnosis present

## 2014-04-11 DIAGNOSIS — C3490 Malignant neoplasm of unspecified part of unspecified bronchus or lung: Secondary | ICD-10-CM | POA: Diagnosis present

## 2014-04-11 DIAGNOSIS — D72829 Elevated white blood cell count, unspecified: Secondary | ICD-10-CM | POA: Diagnosis present

## 2014-04-11 DIAGNOSIS — K56609 Unspecified intestinal obstruction, unspecified as to partial versus complete obstruction: Secondary | ICD-10-CM

## 2014-04-11 DIAGNOSIS — I499 Cardiac arrhythmia, unspecified: Secondary | ICD-10-CM | POA: Diagnosis present

## 2014-04-11 DIAGNOSIS — J449 Chronic obstructive pulmonary disease, unspecified: Secondary | ICD-10-CM

## 2014-04-11 DIAGNOSIS — IMO0002 Reserved for concepts with insufficient information to code with codable children: Secondary | ICD-10-CM | POA: Diagnosis present

## 2014-04-11 DIAGNOSIS — Z888 Allergy status to other drugs, medicaments and biological substances status: Secondary | ICD-10-CM

## 2014-04-11 DIAGNOSIS — Z933 Colostomy status: Secondary | ICD-10-CM

## 2014-04-11 DIAGNOSIS — K219 Gastro-esophageal reflux disease without esophagitis: Secondary | ICD-10-CM | POA: Diagnosis present

## 2014-04-11 DIAGNOSIS — Z6841 Body Mass Index (BMI) 40.0 and over, adult: Secondary | ICD-10-CM

## 2014-04-11 DIAGNOSIS — J439 Emphysema, unspecified: Secondary | ICD-10-CM | POA: Diagnosis present

## 2014-04-11 DIAGNOSIS — N3281 Overactive bladder: Secondary | ICD-10-CM | POA: Diagnosis present

## 2014-04-11 DIAGNOSIS — K7682 Hepatic encephalopathy: Secondary | ICD-10-CM | POA: Diagnosis present

## 2014-04-11 DIAGNOSIS — E7220 Disorder of urea cycle metabolism, unspecified: Secondary | ICD-10-CM

## 2014-04-11 DIAGNOSIS — N83209 Unspecified ovarian cyst, unspecified side: Secondary | ICD-10-CM

## 2014-04-11 DIAGNOSIS — E871 Hypo-osmolality and hyponatremia: Secondary | ICD-10-CM | POA: Diagnosis present

## 2014-04-11 DIAGNOSIS — Z885 Allergy status to narcotic agent status: Secondary | ICD-10-CM

## 2014-04-11 LAB — I-STAT TROPONIN, ED: Troponin i, poc: 0.02 ng/mL (ref 0.00–0.08)

## 2014-04-11 MED ORDER — SODIUM CHLORIDE 0.9 % IV SOLN
1000.0000 mL | INTRAVENOUS | Status: DC
  Administered 2014-04-12: 1000 mL via INTRAVENOUS

## 2014-04-11 NOTE — Telephone Encounter (Signed)
Ammonia sent today secondary to somnolence. Lab called tonight and informed me of critically high value (90+, 3x upper limit of normal).  Informed Leanne that this is critically high level and may indicate severe liver failure. Patient needs to be seen and evaluated immediately for this. Recommended she be taken to an Emergency Room (ideally within [facility] network so that notes and labs available to them). She expressed understanding and will ensure her mom is taken to ER tonight.  Donaciano Eva, MD

## 2014-04-11 NOTE — Progress Notes (Signed)
Consult Note: Gyn-Onc  Consult was requested by Dr. Sabra Heck and Dr Sarajane Jews for the evaluation of [name] 64 y.o. female with a complex right (likely) ovarian mass.  CC:  Chief Complaint  Patient presents with  . Ovarian Cyst    Assessment/Plan:  [name]  is a 64 y.o.  year old with multiple complex medical comorbidities, poor performance status and a right ovarian cystic mass.  I performed a history, physical examination, and personally reviewed the patient's scan images including the CT scans from February, November and December 2015 and February 2016, and the [place] images from February 2016.  I have a very low suspicion that this ovarian mass is malignant. This cyst appears benign on ultrasound (albeit with complex features), there has been a very subtle increase in growth over the course of one year (I can identify the cyst on the [place] 2015 CT scan), CA-125 value is normal which is reassuring and I believe the discrepancy in size between the [place] and CT is that the [place] included the "separate tubular structure" as part of the dimensions of the cyst. I also do not believe that this cystic mass is causing the patient's they upper abdominal severe pains.  I explained to the patient and her daughter that the only way to definitively rule out malignancy is for surgical excision. However, this patient is not a surgical candidate. I discussed with the patient and her daughter that the risk for malignancy is lower than the risk for major perioperative morbidity and mortality. I'm particularly concerned about the high risk for operative mortality in a patient undergoing major surgery with hepatic cirrhosis, prolonged ventilator dependency and tracheostomy creation in a patient with underlying respiratory failure and oxygen dependency, injury to visceral structures (particularly bowel) given her multiple prior abdominal surgeries in the setting of a prior ruptured colon. She is a massive  parastomal hernia that would require revision of the time of surgery and given her poor nutritional status and performance status her chance of healing her wound is low. I discussed with the patient and her daughter that even if this was an ovarian cancer given that she is not an operative candidate, and surgery would not be part of her treatment. In the absence of findings that are very concerning for ovarian cancer I believe that close follow-up with a repeat ultrasound scan in 3 months is the most prudent approach. If she were to develop features on imaging that one more concerning for cancer we will contemplate biopsy of the mass and initiate subsequent medical management of a malignancy should one be discovered.  I am concerned about her altered mental status which the patient's daughter reports has been present for 1 week. She denies any change in medication including narcotics or benzodiazepine use. I have ordered a serum ammonia level and informed the patient and her daughter that she should return to see [name] this week for further evaluation of his altered mental status. I do not believe that the ultimate mental status is related to the ovarian cyst.   HPI: [name] is a 64 year old woman who is seen in consultation at the request of Dr. Sabra Heck and Dr Sarajane Jews for a 6-7 cm complex right ovarian cyst.  The patient has had multiple CT scans performed in the past 12 months largely due to follow-up after a splenectomy and chronic upper abdominal and lower pelvic pain. On one of the reports from the CT scan in every 2016 comment  was made regarding a 3.3 cm right adnexal cyst that had been previously 2.1 cm. They also noted an adjacent 3 cm tubular cystic structure in the midline. Of note the patient has a history of a prior hysterectomy for cervical cancer in her 39s.  To follow up the findings from the CT scan transvaginal ultrasounds performed on for every 24 2016 this confirmed the right adnexal  cystic mass and described it as a 5.0 x 4.4 x 7.4 cm cystic mass with multiple irregular septations and thickening. The left ovary was not identified. There was no free pelvic fluid. The uterus was surgically absent. A CA-125 was drawn on 03/28/2014 and was normal at 29 units per milliliter.  When I review the images back in every 2015 this cyst is visible on that scan, albeit slightly smaller in size.  [name] has multiple medical comorbidities. She has class III morbid obesity (BMI 41kg/m2). She has nonalcoholic cirrhosis stage IV. She also has respiratory failure and his oxygen dependent secondary to COPD and emphysema. She is a former smoker. She has a prior history of lung cancer treated surgically. 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 In 2007 she suffered a perforation of her colon during colonoscopy and required an emergent colostomy formation. She subsequently developed a parastomal hernia. Her colostomy has not been reversed due to her underlying poor general medical health and she is felt to not be a good surgical candidate. She also has a history of an irregular heartbeat, CHF and placement of a pacemaker. She has no family history for ovarian or breast cancer.   Interval History: In the past week she has been noted to have increasing somnolence, and falling asleep with conversation. Her pain in her mid and upper abdomen is persistent.  Current Meds:  Outpatient Encounter Prescriptions as of 04/11/2014  Medication Sig  . albuterol (PROVENTIL) (2.5 MG/3ML) 0.083% nebulizer solution Take 3 mLs (2.5 mg total) by nebulization every 4 (four) hours as needed for wheezing or shortness of breath.  [name] albuterol-ipratropium (COMBIVENT) 18-103 MCG/ACT inhaler Inhale 2 puffs into the lungs every 4 (four) hours as needed for wheezing or shortness of breath.   [name] atorvastatin (LIPITOR) 20 MG tablet Take 1 tablet (20 mg total) by mouth every  morning.  . diazepam (VALIUM) 5 MG tablet Take 1 tablet (5 mg total) by mouth 3 (three) times daily as needed.  . dicyclomine (BENTYL) 10 MG capsule Take 10 mg by mouth 3 (three) times daily.   [name] esomeprazole (NEXIUM) 40 MG capsule Take 1 capsule (40 mg total) by mouth every evening.  . Fluticasone-Salmeterol (ADVAIR) 250-50 MCG/DOSE AEPB Inhale 1 puff into the lungs every 12 (twelve) hours.  . folic acid (FOLVITE) 1 MG tablet Take 1 tablet (1 mg total) by mouth every evening.  . furosemide (LASIX) 40 MG tablet Take 2 tablets (80 mg total) by mouth 2 (two) times daily.  . hydrALAZINE (APRESOLINE) 25 MG tablet Take 1.5 tablets (37.5 mg total) by mouth 2 (two) times daily.  . insulin regular human CONCENTRATED (HUMULIN R) 500 UNIT/ML SOLN injection Inject under skin 0.33 mL before b'fast, 0.33 mL before lunch and 0.30 mL before dinner  . INSULIN SYRINGE 1CC/29G (B-D INSULIN SYRINGE) 29G X 1/2" 1 ML MISC Use to inject insulin 3 times daily.  . isosorbide dinitrate (ISORDIL) 20 MG tablet Take 1 tablet (20 mg total) by mouth 2 (two) times daily.  [name] losartan (COZAAR) 50  MG tablet Take 1 tablet (50 mg total) by mouth daily.  . metoCLOPramide (REGLAN) 10 MG tablet Take 1 tablet (10 mg total) by mouth 3 (three) times daily.  . metolazone (ZAROXOLYN) 2.5 MG tablet   . Oxycodone HCl 20 MG TABS Take 1 tablet (20 mg total) by mouth every 3 (three) hours as needed (pain).  . OXYGEN Inhale 2 L into the lungs continuous.  . potassium chloride (KLOR-CON 10) 10 MEQ tablet Take 1 tablet (10 mEq total) by mouth daily.  . promethazine (PHENERGAN) 25 MG tablet Take 1 tablet (25 mg total) by mouth every 4 (four) hours as needed for nausea or vomiting.  . [DISCONTINUED] KLOR-CON M20 20 MEQ tablet   . methocarbamol (ROBAXIN) 500 MG tablet Take 1 tablet (500 mg total) by mouth every 6 (six) hours as needed for muscle spasms. (Patient not taking: Reported on 04/11/2014)  . [DISCONTINUED] metFORMIN (GLUCOPHAGE) 500 MG tablet      Allergy:  Allergies  Allergen Reactions  . Acetaminophen Other (See Comments)    Cirrhosis of liver  . Morphine Other (See Comments)    REACTION: Lowers BP  . Morphine And Related Other (See Comments)    Blood pressure drops   . Other Other (See Comments)    AGENT:  Per pt, CANNOT TAKE ANY FORM OF BLOOD THINNER, due to cirrhosis of the liver  . Penicillins Anaphylaxis and Rash  . Trazodone And Nefazodone Other (See Comments)    Cardiac arrythmia - DO NOT USE  . Codeine Phosphate Other (See Comments)    REACTION: Stomach cramps  . Hydrocodone-Acetaminophen Other (See Comments)    REACTION: hallucinations  . Cephalexin Swelling and Rash  . Hydrocodone-Acetaminophen Other (See Comments)    unknown    Social Hx:   History   Social History  . Marital Status: Divorced    Spouse Name: N/A  . Number of Children: N/A  . Years of Education: N/A   Occupational History  . Disabled    Social History Main Topics  . Smoking status: Former Smoker -- 2.00 packs/day for 35 years    Types: Cigarettes    Quit date: 03/24/2002  . Smokeless tobacco: Never Used  . Alcohol Use: No  . Drug Use: No  . Sexual Activity: No   Other Topics Concern  . Not on file   Social History Narrative   Regular exercise: a little   Caffeine use: 2 cups of coffee in am          Past Surgical Hx:  Past Surgical History  Procedure Laterality Date  . Colostomy  11/06/2005  . Lung removal, partial Left 2004    upper lobe removed  . Left colectomy  11/06/2005    Hartmann resection of sigmoid colon and end colostomy.  . Esophagogastroduodenoscopy  03/19/2011    Procedure: ESOPHAGOGASTRODUODENOSCOPY (EGD);  Surgeon: [name], MD;  Location: [facility];  Service: Endoscopy;  Laterality: N/A;  . Colonoscopy  03/19/2011    Procedure: COLONOSCOPY;  Surgeon: [name], MD;  Location: [facility];  Service: Endoscopy;  Laterality: N/A;  . Givens capsule study  03/20/2011    Procedure:  GIVENS CAPSULE STUDY;  Surgeon: [name], MD;  Location: [facility];  Service: Endoscopy;  Laterality: N/A;  . Small bowel obstruction repair  March 2012  . Umbilical hernia repair  March 2012  . Splenectomy, total N/A 02/24/2013    Procedure: SPLENECTOMY;  Surgeon: Harl Bowie, MD;  Location: [place];  Service: General;  Laterality: N/A;  . Orif patella Left 04/21/2013    Procedure: OPEN REDUCTION INTERNAL (ORIF) FIXATION PATELLA;  Surgeon: Augustin Schooling, MD;  Location: [place];  Service: Orthopedics;  Laterality: Left;  . Pacemaker insertion  2015  . Esophagogastroduodenoscopy (egd) with propofol N/A 11/02/2013    Procedure: ESOPHAGOGASTRODUODENOSCOPY (EGD) WITH PROPOFOL;  Surgeon: [name], MD;  Location: [facility];  Service: Endoscopy;  Laterality: N/A;  EGD with APC  . Hot hemostasis N/A 11/02/2013    Procedure: HOT HEMOSTASIS (ARGON PLASMA COAGULATION/BICAP);  Surgeon: [name], MD;  Location: [facility];  Service: Endoscopy;  Laterality: N/A;  . Colon surgery    . Hernia repair    . Fracture surgery    . Abdominal hysterectomy    . Tubal ligation    . Tonsillectomy  1959  . Appendectomy  1962  . Cholecystectomy    . Cardiac catheterization  1967  . Permanent pacemaker insertion N/A 09/07/2013    Procedure: PERMANENT PACEMAKER INSERTION;  Surgeon: Evans Lance, MD;  Location: [facility] CATH LAB;  Service: Cardiovascular;  Laterality: N/A;  . Tee without cardioversion N/A 01/06/2014    Procedure: TRANSESOPHAGEAL ECHOCARDIOGRAM (TEE);  Surgeon: Lelon Perla, MD;  Location: [facility] ENDOSCOPY;  Service: Cardiovascular;  Laterality: N/A;  . Colostomy  2007    Past Medical Hx:  Past Medical History  Diagnosis Date  . Cirrhosis   . GERD (gastroesophageal reflux disease)   . Cervical disc syndrome     trouble turnng neck at times  . Chronic respiratory failure   . Diverticulitis of colon   . Perforation of colon   . IBS (irritable bowel syndrome)   . Chronic  lower GI bleeding   . Overactive bladder   . Thrombocytopenia     sees Dr. Julien Nordmann   . Splenomegaly   . Depression   . Hypertension   . Asthma   . Heart murmur   . Hyperlipidemia   . COPD (chronic obstructive pulmonary disease)     sees Dr. Gwenette Greet   . Colostomy care   . Hypercalcemia   . CHF (congestive heart failure)   . Pacemaker 12/14/2013  . 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 Cervical cancer many years ago  . Complication of anesthesia     woke up during colonscopy and endoscopy in past  . History of blood transfusion "several"    "bleeding via ostomy" (01/04/2014)  . On home oxygen therapy     "2L; 24/7" (01/04/2014)  . Pneumonia "1-2 times"  . Chronic bronchitis "several times"  . Sleep apnea 2012    mild, no cpap needed  . Type II diabetes mellitus     sees Dr. Cruzita Lederer   . Chronic disease anemia     sees Dr. Julien Nordmann, due to chronic disease and GI losses   . History of stomach ulcers   . Migraine     "last one was several years ago" (01/04/2014)  . Arthritis     "tailbone; hands; legs" (01/04/2014)  . Anxiety   . Pericardial effusion 2007, 2015.   [name] Chronic abdominal pain     IBS  . Cirrhosis of liver     stage 4    Past Gynecological History:  Cervical cancer s/p hysterectomy 40 years ago.  No LMP recorded. Patient has had a hysterectomy.  Family Hx:  Family History  Problem Relation Age of Onset  . Coronary artery disease    .  Diabetes type II    . Heart attack Mother   . Diabetes type II Mother   . Cirrhosis Father   . Heart attack Sister   . Pancreatic cancer Mother     secondary from surgery  . Cervical cancer Maternal Grandmother   . Hypertension Father   . Hypertension Sister   . Hypertension Son   . Hypertension Daughter   . Stroke Neg Hx     Review of Systems:  Constitutional  Feels fatigued, somnolent  ENT Normal appearing ears and nares bilaterally Skin/Breast  No rash, sores, jaundice, itching,  dryness Cardiovascular  No chest pain, + shortness of breath, +edema  Pulmonary  + cough + wheeze.  Gastro Intestinal  No nausea, vomitting, or diarrhoea. No bright red blood per rectum, + abdominal pain, no change in bowel movement, or constipation.  Genito Urinary  No frequency, urgency, dysuria, no vaginal bleeding Musculo Skeletal  + myalgia, arthralgia, joint swelling and pain  Neurologic  No weakness, numbness, change in gait,  Psychology  No depression, anxiety, insomnia.   Vitals:  Blood pressure 137/50, pulse 62, temperature 98.4 F (36.9 C), temperature source Oral, resp. rate 18, height 5' (1.524 m), weight 212 lb 1.6 oz (96.208 kg), SpO2 96 %.  Physical Exam: WD in NAD Neck  Supple NROM, without any enlargements.  Lymph Node Survey No cervical supraclavicular or inguinal adenopathy Cardiovascular  Pulse normal rate, regularity and rhythm. Systolic murmur Lungs  Expiratory wheezes and decreased breath sounds bilaterally. Patient on O2. Skin  No rash/lesions/breakdown  Psychiatry  Alert and oriented to persn, place, and time however falls asleep mid conversation and while she is talking. Rousable with touching her arm. Appears narcotized. Abdomen  Normoactive bowel sounds, abdomen soft, non-tender and obese with colostomy and large parastomal hernia. Stool in bag. Well healed vertical midline incisions. Back No CVA tenderness Genito Urinary  Vulva/vagina: Normal external female genitalia.  No lesions. No discharge or bleeding.  Bladder/urethra:  No lesions or masses, well supported bladder  Vagina: palpably normal  Cervix: surgically absent  Uterus: surgically absent   Adnexa: no palpabe masses though exam is limited by body habitus. Rectal  Good tone, no masses no cul de sac nodularity.  Extremities  No bilateral cyanosis, clubbing or edema.   Donaciano Eva, MD   04/11/2014, 4:02 PM

## 2014-04-11 NOTE — Patient Instructions (Signed)
Followup with [name] and Dr. Sabra Heck as scheduled with their office. Please call us in the future with any questions or concerns.

## 2014-04-11 NOTE — ED Notes (Signed)
Patient arrives via EMS from home with complaints of increased lethargy, abdominal pain and diarrhea.  She was seen by oncologist today and ammonia level was increased.  Currently difficult to awaken.

## 2014-04-11 NOTE — ED Provider Notes (Signed)
CSN: [id number]     Arrival date & time 04/11/14  2210 History   First MD Initiated Contact with Patient 04/11/14 2218     Chief Complaint  Patient presents with  . Fatigue    HPI Patient has history of multiple medical problems that include cirrhosis.  The patient has been having increasing lethargy and somnolence for over the last 7 days. Symptoms have been gradually increasing in severity.  The patient was at the oncologist's office for evaluation of another issue. They noticed that the patient was very somnolent. Laboratory tests were performed at the doctor's office. The family was called today and was told that her ammonia level was very high, 90, and that she should come to the emergency department.  The patient has had some complaints of diarrhea. She is also having abdominal pain in her upper abdomen that is worse than previously. She has not had any vomiting. No headaches or head injury. No fevers or chills. Past Medical History  Diagnosis Date  . Cirrhosis   . GERD (gastroesophageal reflux disease)   . Cervical disc syndrome     trouble turnng neck at times  . Chronic respiratory failure   . Diverticulitis of colon   . Perforation of colon   . IBS (irritable bowel syndrome)   . Chronic lower GI bleeding   . Overactive bladder   . Thrombocytopenia     sees Dr. Julien Nordmann   . Splenomegaly   . Depression   . Hypertension   . Asthma   . Heart murmur   . Hyperlipidemia   . COPD (chronic obstructive pulmonary disease)     sees Dr. Gwenette Greet   . Colostomy care   . Hypercalcemia   . CHF (congestive heart failure)   . Pacemaker 12/14/2013  . 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 Cervical cancer many years ago  . Complication of anesthesia     woke up during colonscopy and endoscopy in past  . History of blood transfusion "several"    "bleeding via ostomy" (01/04/2014)  . On home oxygen therapy     "2L; 24/7" (01/04/2014)  . Pneumonia "1-2 times"  .  Chronic bronchitis "several times"  . Sleep apnea 2012    mild, no cpap needed  . Type II diabetes mellitus     sees Dr. Cruzita Lederer   . Chronic disease anemia     sees Dr. Julien Nordmann, due to chronic disease and GI losses   . History of stomach ulcers   . Migraine     "last one was several years ago" (01/04/2014)  . Arthritis     "tailbone; hands; legs" (01/04/2014)  . Anxiety   . Pericardial effusion 2007, 2015.   [name] Chronic abdominal pain     IBS  . Cirrhosis of liver     stage 4   Past Surgical History  Procedure Laterality Date  . Colostomy  11/06/2005  . Lung removal, partial Left 2004    upper lobe removed  . Left colectomy  11/06/2005    Hartmann resection of sigmoid colon and end colostomy.  . Esophagogastroduodenoscopy  03/19/2011    Procedure: ESOPHAGOGASTRODUODENOSCOPY (EGD);  Surgeon: [name], MD;  Location: [facility];  Service: Endoscopy;  Laterality: N/A;  . Colonoscopy  03/19/2011    Procedure: COLONOSCOPY;  Surgeon: [name], MD;  Location: [facility];  Service: Endoscopy;  Laterality: N/A;  . Givens capsule study  03/20/2011  Procedure: GIVENS CAPSULE STUDY;  Surgeon: [name], MD;  Location: [facility];  Service: Endoscopy;  Laterality: N/A;  . Small bowel obstruction repair  March 2012  . Umbilical hernia repair  March 2012  . Splenectomy, total N/A 02/24/2013    Procedure: SPLENECTOMY;  Surgeon: Harl Bowie, MD;  Location: [place];  Service: General;  Laterality: N/A;  . Orif patella Left 04/21/2013    Procedure: OPEN REDUCTION INTERNAL (ORIF) FIXATION PATELLA;  Surgeon: Augustin Schooling, MD;  Location: [place];  Service: Orthopedics;  Laterality: Left;  . Pacemaker insertion  2015  . Esophagogastroduodenoscopy (egd) with propofol N/A 11/02/2013    Procedure: ESOPHAGOGASTRODUODENOSCOPY (EGD) WITH PROPOFOL;  Surgeon: [name], MD;  Location: [facility];  Service: Endoscopy;  Laterality: N/A;  EGD with APC  . Hot hemostasis N/A  11/02/2013    Procedure: HOT HEMOSTASIS (ARGON PLASMA COAGULATION/BICAP);  Surgeon: [name], MD;  Location: [facility];  Service: Endoscopy;  Laterality: N/A;  . Colon surgery    . Hernia repair    . Fracture surgery    . Abdominal hysterectomy    . Tubal ligation    . Tonsillectomy  1959  . Appendectomy  1962  . Cholecystectomy    . Cardiac catheterization  1967  . Permanent pacemaker insertion N/A 09/07/2013    Procedure: PERMANENT PACEMAKER INSERTION;  Surgeon: Evans Lance, MD;  Location: [facility] CATH LAB;  Service: Cardiovascular;  Laterality: N/A;  . Tee without cardioversion N/A 01/06/2014    Procedure: TRANSESOPHAGEAL ECHOCARDIOGRAM (TEE);  Surgeon: Lelon Perla, MD;  Location: [facility] ENDOSCOPY;  Service: Cardiovascular;  Laterality: N/A;  . Colostomy  2007   Family History  Problem Relation Age of Onset  . Coronary artery disease    . Diabetes type II    . Heart attack Mother   . Diabetes type II Mother   . Cirrhosis Father   . Heart attack Sister   . Pancreatic cancer Mother     secondary from surgery  . Cervical cancer Maternal Grandmother   . Hypertension Father   . Hypertension Sister   . Hypertension Son   . Hypertension Daughter   . Stroke Neg Hx    History  Substance Use Topics  . Smoking status: Former Smoker -- 2.00 packs/day for 35 years    Types: Cigarettes    Quit date: 03/24/2002  . Smokeless tobacco: Never Used  . Alcohol Use: No   OB History    Gravida Para Term Preterm AB TAB SAB Ectopic Multiple Living   6 4        4      Review of Systems  Constitutional: Negative for fever.  Respiratory: Negative for shortness of breath.   Cardiovascular: Negative for chest pain.  Gastrointestinal: Positive for abdominal pain.  Endocrine: Negative for polyuria.  All other systems reviewed and are negative.     Allergies  Acetaminophen; Morphine; Morphine and related; Other; Penicillins; Trazodone and nefazodone; Codeine phosphate;  Hydrocodone-acetaminophen; Cephalexin; and Hydrocodone-acetaminophen  Home Medications   Prior to Admission medications   Medication Sig Start Date End Date Taking? Authorizing Provider  albuterol (PROVENTIL) (2.5 MG/3ML) 0.083% nebulizer solution Take 3 mLs (2.5 mg total) by nebulization every 4 (four) hours as needed for wheezing or shortness of breath. 03/02/14   [name], MD  albuterol-ipratropium (COMBIVENT) [phone] MCG/ACT inhaler Inhale 2 puffs into the lungs every 4 (four) hours as needed for wheezing or shortness of breath.  Historical Provider, MD  atorvastatin (LIPITOR) 20 MG tablet Take 1 tablet (20 mg total) by mouth every morning. 03/02/14   [name], MD  diazepam (VALIUM) 5 MG tablet Take 1 tablet (5 mg total) by mouth 3 (three) times daily as needed. 03/02/14   [name], MD  dicyclomine (BENTYL) 10 MG capsule Take 10 mg by mouth 3 (three) times daily.     Historical Provider, MD  esomeprazole (NEXIUM) 40 MG capsule Take 1 capsule (40 mg total) by mouth every evening. 03/02/14   [name], MD  Fluticasone-Salmeterol (ADVAIR) 250-50 MCG/DOSE AEPB Inhale 1 puff into the lungs every 12 (twelve) hours. 03/02/14   [name], MD  folic acid (FOLVITE) 1 MG tablet Take 1 tablet (1 mg total) by mouth every evening. 04/12/13   [name], MD  furosemide (LASIX) 40 MG tablet Take 2 tablets (80 mg total) by mouth 2 (two) times daily. 03/30/14   Liliane Shi, PA-C  hydrALAZINE (APRESOLINE) 25 MG tablet Take 1.5 tablets (37.5 mg total) by mouth 2 (two) times daily. 03/02/14   [name], MD  insulin regular human CONCENTRATED (HUMULIN R) 500 UNIT/ML SOLN injection Inject under skin 0.33 mL before b'fast, 0.33 mL before lunch and 0.30 mL before dinner 03/01/14   Philemon Kingdom, MD  INSULIN SYRINGE 1CC/29G (B-D INSULIN SYRINGE) 29G X 1/2" 1 ML MISC Use to inject insulin 3 times daily. 03/01/14   Philemon Kingdom, MD  isosorbide dinitrate (ISORDIL) 20 MG tablet Take 1 tablet (20 mg  total) by mouth 2 (two) times daily. 10/13/13   [name], MD  losartan (COZAAR) 50 MG tablet Take 1 tablet (50 mg total) by mouth daily. 03/18/14   Liliane Shi, PA-C  methocarbamol (ROBAXIN) 500 MG tablet Take 1 tablet (500 mg total) by mouth every 6 (six) hours as needed for muscle spasms. Patient not taking: Reported on 04/11/2014 05/03/13   Ivan Anchors Love, PA-C  metoCLOPramide (REGLAN) 10 MG tablet Take 1 tablet (10 mg total) by mouth 3 (three) times daily. 04/12/13   [name], MD  metolazone (ZAROXOLYN) 2.5 MG tablet  03/02/14   Historical Provider, MD  Oxycodone HCl 20 MG TABS Take 1 tablet (20 mg total) by mouth every 3 (three) hours as needed (pain). 03/02/14   [name], MD  OXYGEN Inhale 2 L into the lungs continuous.    Historical Provider, MD  potassium chloride (KLOR-CON 10) 10 MEQ tablet Take 1 tablet (10 mEq total) by mouth daily. 03/16/14   [name], MD  promethazine (PHENERGAN) 25 MG tablet Take 1 tablet (25 mg total) by mouth every 4 (four) hours as needed for nausea or vomiting. 11/23/13   [name], MD   BP 132/46 mmHg  Pulse 59  Temp(Src) 98.8 F (37.1 C) (Oral)  Resp 13  SpO2 98% Physical Exam  Constitutional: She appears listless. No distress.  HENT:  Head: Normocephalic and atraumatic.  Right Ear: External ear normal.  Left Ear: External ear normal.  Mouth/Throat: No oropharyngeal exudate.  Eyes: Conjunctivae are normal. Right eye exhibits no discharge. Left eye exhibits no discharge. No scleral icterus.  Neck: Neck supple. No tracheal deviation present.  Cardiovascular: Normal rate, regular rhythm and intact distal pulses.   Pulmonary/Chest: Effort normal and breath sounds normal. No stridor. No respiratory distress. She has no wheezes. She has no rales.  Abdominal: Soft. Bowel sounds are normal. She exhibits no distension, no fluid  wave and no mass. There is tenderness in the right upper quadrant and epigastric area. There is no rebound and no  guarding. No hernia.  Musculoskeletal: She exhibits no edema or tenderness.  Edema noted in the ankles bilaterally  Neurological: She appears listless. She is disoriented. No cranial nerve deficit (no facial droop, extraocular movements intact, no slurred speech) or sensory deficit. She exhibits normal muscle tone. She displays no seizure activity. Coordination normal. GCS eye subscore is 4. GCS verbal subscore is 4. GCS motor subscore is 6.  Generalized weakness, slow to answer questions  Skin: Skin is warm and dry. No rash noted. She is not diaphoretic.  Psychiatric: She has a normal mood and affect.  Nursing note and vitals reviewed.   ED Course  Procedures (including critical care time) CRITICAL CARE Performed by: QIONG,EXB Total critical care time: 30 Critical care time was exclusive of separately billable procedures and treating other patients. Critical care was necessary to treat or prevent imminent or life-threatening deterioration. Critical care was time spent personally by me on the following activities: development of treatment plan with patient and/or surrogate as well as nursing, discussions with consultants, evaluation of patient's response to treatment, examination of patient, obtaining history from patient or surrogate, ordering and performing treatments and interventions, ordering and review of laboratory studies, ordering and review of radiographic studies, pulse oximetry and re-evaluation of patient's condition.  Labs Review Labs Reviewed  CBC WITH DIFFERENTIAL/PLATELET - Abnormal; Notable for the following:    WBC 12.8 (*)    RBC 3.21 (*)    Hemoglobin 9.2 (*)    HCT 29.3 (*)    Monocytes Relative 19 (*)    Eosinophils Relative 8 (*)    Monocytes Absolute 2.4 (*)    Eosinophils Absolute 1.0 (*)    All other components within normal limits  COMPREHENSIVE METABOLIC PANEL - Abnormal; Notable for the following:    Sodium 134 (*)    Glucose, Bld 219 (*)    BUN 32 (*)     Creatinine, Ser 1.38 (*)    Calcium 10.8 (*)    AST 54 (*)    Alkaline Phosphatase 121 (*)    GFR calc non Af Amer 40 (*)    GFR calc Af Amer 46 (*)    All other components within normal limits  AMMONIA - Abnormal; Notable for the following:    Ammonia 101 (*)    All other components within normal limits  LIPASE, BLOOD  APTT  PROTIME-INR  URINALYSIS, ROUTINE W REFLEX MICROSCOPIC  I-STAT TROPOININ, ED  TYPE AND SCREEN    Imaging Review Ct Head Wo Contrast  04/12/2014   CLINICAL DATA:  Progressive weakness and fatigue for 6 months. Elevated ammonia levels. Initial encounter.  EXAM: CT HEAD WITHOUT CONTRAST  TECHNIQUE: Contiguous axial images were obtained from the base of the skull through the vertex without intravenous contrast.  COMPARISON:  CT of the head performed 04/21/2013  FINDINGS: There is no evidence of acute infarction, mass lesion, or intra- or extra-axial hemorrhage on CT.  Prominence of the sulci suggests mild cortical volume loss. Mild cerebellar atrophy is noted.  The brainstem and fourth ventricle are within normal limits. The basal ganglia are unremarkable in appearance. The cerebral hemispheres demonstrate grossly normal gray-white differentiation. No mass effect or midline shift is seen.  There is no evidence of fracture; visualized osseous structures are unremarkable in appearance. The orbits are within normal limits. A mucus retention cyst or polyp is noted at  the right maxillary sinus. The remaining paranasal sinuses and mastoid air cells are well-aerated. No significant soft tissue abnormalities are seen.  IMPRESSION: 1. No acute intracranial pathology seen on CT. 2. Mild cortical volume loss noted. 3. Mucus retention cyst or polyp at the right maxillary sinus.   Electronically Signed   By: Garald Balding M.D.   On: 04/12/2014 00:17   Dg Abd Acute W/chest  04/11/2014   CLINICAL DATA:  Upper abdominal pain for 2 months, worse tonight.  EXAM: ACUTE ABDOMEN SERIES  (ABDOMEN 2 VIEW & CHEST 1 VIEW)  COMPARISON:  03/04/2014  FINDINGS: The upright view the chest demonstrates moderate unchanged cardiomegaly. Moderate vascular congestion is present, without alveolar edema or infiltrate. Supine and left lateral decubitus views of the abdomen are negative for obstruction or perforation.  IMPRESSION: Negative abdominal radiographs. Moderate vascular congestion. Unchanged cardiomegaly.   Electronically Signed   By: Andreas Newport M.D.   On: 04/11/2014 23:57     EKG Interpretation   Date/Time:  Monday April 11 2014 22:20:13 EDT Ventricular Rate:  60 PR Interval:  176 QRS Duration: 96 QT Interval:  445 QTC Calculation: 445 R Axis:   3 Text Interpretation:  Atrial-paced rhythm ATRIAL PACED RHYTHM new since  last tracing Confirmed by Cala Kruckenberg  MD-J, Dontasia Miranda ([id number]) on 04/11/2014 10:47:07  PM     Medications  0.9 %  sodium chloride infusion (not administered)  lactulose (CHRONULAC) 10 GM/15ML solution 30 g (not administered)    MDM   Final diagnoses:  Hyperammonemia  Hepatic cirrhosis, unspecified hepatic cirrhosis type   Pt presents with lethargy and altered mental status that has been progressively getting worse.  Ammonia level is elevated to 100.  Will start lactulose. Unclear what triggered this precipitation. CT scan last month did not show any ascites  Dorie Rank, MD 04/12/14 [phone]

## 2014-04-11 NOTE — ED Notes (Signed)
Bed: UD31 Expected date:  Expected time:  Means of arrival:  Comments: EMS 63yo lethargy; ammonia level 90 at MD office today

## 2014-04-11 NOTE — Telephone Encounter (Signed)
A voice message was left, pt was seen at GYN today and assistant noticed that pt was heavily sedated, family member was with pt today. Pt had labs drawn today. [name] is aware of this and will review lab results once they are back.

## 2014-04-11 NOTE — Telephone Encounter (Signed)
Left voicemail for Sunday Spillers, nurse for [name], stating that patient was very drowsy at today's MD visit with Dr. Denman George. Per patient's daughter, her mom has been more sedated the last month but especially over the past week. Told Sunday Spillers that Dr. Denman George ordered an ammonia level which was drawn today. Told Sunday Spillers to please call our office back with any questions regarding patient.

## 2014-04-12 ENCOUNTER — Inpatient Hospital Stay (HOSPITAL_COMMUNITY): Payer: Medicare Other

## 2014-04-12 ENCOUNTER — Ambulatory Visit: Payer: Medicare Other | Admitting: Internal Medicine

## 2014-04-12 ENCOUNTER — Encounter (HOSPITAL_COMMUNITY): Payer: Self-pay | Admitting: Radiology

## 2014-04-12 DIAGNOSIS — K7290 Hepatic failure, unspecified without coma: Principal | ICD-10-CM

## 2014-04-12 DIAGNOSIS — E1140 Type 2 diabetes mellitus with diabetic neuropathy, unspecified: Secondary | ICD-10-CM | POA: Diagnosis present

## 2014-04-12 DIAGNOSIS — I10 Essential (primary) hypertension: Secondary | ICD-10-CM | POA: Diagnosis not present

## 2014-04-12 DIAGNOSIS — K7030 Alcoholic cirrhosis of liver without ascites: Secondary | ICD-10-CM | POA: Diagnosis not present

## 2014-04-12 DIAGNOSIS — Z833 Family history of diabetes mellitus: Secondary | ICD-10-CM | POA: Diagnosis not present

## 2014-04-12 DIAGNOSIS — Z902 Acquired absence of lung [part of]: Secondary | ICD-10-CM | POA: Diagnosis present

## 2014-04-12 DIAGNOSIS — G9340 Encephalopathy, unspecified: Secondary | ICD-10-CM

## 2014-04-12 DIAGNOSIS — K219 Gastro-esophageal reflux disease without esophagitis: Secondary | ICD-10-CM | POA: Diagnosis present

## 2014-04-12 DIAGNOSIS — A09 Infectious gastroenteritis and colitis, unspecified: Secondary | ICD-10-CM | POA: Diagnosis not present

## 2014-04-12 DIAGNOSIS — K5660 Unspecified intestinal obstruction: Secondary | ICD-10-CM | POA: Diagnosis not present

## 2014-04-12 DIAGNOSIS — Z9049 Acquired absence of other specified parts of digestive tract: Secondary | ICD-10-CM | POA: Diagnosis present

## 2014-04-12 DIAGNOSIS — Z885 Allergy status to narcotic agent status: Secondary | ICD-10-CM | POA: Diagnosis not present

## 2014-04-12 DIAGNOSIS — Z881 Allergy status to other antibiotic agents status: Secondary | ICD-10-CM | POA: Diagnosis not present

## 2014-04-12 DIAGNOSIS — Z808 Family history of malignant neoplasm of other organs or systems: Secondary | ICD-10-CM | POA: Diagnosis not present

## 2014-04-12 DIAGNOSIS — E871 Hypo-osmolality and hyponatremia: Secondary | ICD-10-CM | POA: Diagnosis present

## 2014-04-12 DIAGNOSIS — K589 Irritable bowel syndrome without diarrhea: Secondary | ICD-10-CM | POA: Diagnosis present

## 2014-04-12 DIAGNOSIS — Z9081 Acquired absence of spleen: Secondary | ICD-10-CM | POA: Diagnosis present

## 2014-04-12 DIAGNOSIS — Z880 Allergy status to penicillin: Secondary | ICD-10-CM | POA: Diagnosis not present

## 2014-04-12 DIAGNOSIS — Z9981 Dependence on supplemental oxygen: Secondary | ICD-10-CM | POA: Diagnosis not present

## 2014-04-12 DIAGNOSIS — K435 Parastomal hernia without obstruction or  gangrene: Secondary | ICD-10-CM | POA: Diagnosis not present

## 2014-04-12 DIAGNOSIS — Z8249 Family history of ischemic heart disease and other diseases of the circulatory system: Secondary | ICD-10-CM | POA: Diagnosis not present

## 2014-04-12 DIAGNOSIS — E785 Hyperlipidemia, unspecified: Secondary | ICD-10-CM | POA: Diagnosis present

## 2014-04-12 DIAGNOSIS — Z933 Colostomy status: Secondary | ICD-10-CM | POA: Diagnosis not present

## 2014-04-12 DIAGNOSIS — Z8541 Personal history of malignant neoplasm of cervix uteri: Secondary | ICD-10-CM | POA: Diagnosis not present

## 2014-04-12 DIAGNOSIS — I129 Hypertensive chronic kidney disease with stage 1 through stage 4 chronic kidney disease, or unspecified chronic kidney disease: Secondary | ICD-10-CM | POA: Diagnosis present

## 2014-04-12 DIAGNOSIS — J449 Chronic obstructive pulmonary disease, unspecified: Secondary | ICD-10-CM | POA: Diagnosis present

## 2014-04-12 DIAGNOSIS — Z86010 Personal history of colonic polyps: Secondary | ICD-10-CM | POA: Diagnosis not present

## 2014-04-12 DIAGNOSIS — Z8049 Family history of malignant neoplasm of other genital organs: Secondary | ICD-10-CM | POA: Diagnosis not present

## 2014-04-12 DIAGNOSIS — Z87891 Personal history of nicotine dependence: Secondary | ICD-10-CM | POA: Diagnosis not present

## 2014-04-12 DIAGNOSIS — K7460 Unspecified cirrhosis of liver: Secondary | ICD-10-CM | POA: Diagnosis present

## 2014-04-12 DIAGNOSIS — Z888 Allergy status to other drugs, medicaments and biological substances status: Secondary | ICD-10-CM | POA: Diagnosis not present

## 2014-04-12 DIAGNOSIS — N179 Acute kidney failure, unspecified: Secondary | ICD-10-CM | POA: Diagnosis present

## 2014-04-12 DIAGNOSIS — C3490 Malignant neoplasm of unspecified part of unspecified bronchus or lung: Secondary | ICD-10-CM | POA: Diagnosis present

## 2014-04-12 DIAGNOSIS — K7581 Nonalcoholic steatohepatitis (NASH): Secondary | ICD-10-CM | POA: Diagnosis present

## 2014-04-12 DIAGNOSIS — Z950 Presence of cardiac pacemaker: Secondary | ICD-10-CM | POA: Diagnosis not present

## 2014-04-12 DIAGNOSIS — F339 Major depressive disorder, recurrent, unspecified: Secondary | ICD-10-CM | POA: Diagnosis present

## 2014-04-12 DIAGNOSIS — I499 Cardiac arrhythmia, unspecified: Secondary | ICD-10-CM | POA: Diagnosis present

## 2014-04-12 DIAGNOSIS — N8320 Unspecified ovarian cysts: Secondary | ICD-10-CM | POA: Diagnosis present

## 2014-04-12 DIAGNOSIS — D72829 Elevated white blood cell count, unspecified: Secondary | ICD-10-CM | POA: Diagnosis present

## 2014-04-12 DIAGNOSIS — K7682 Hepatic encephalopathy: Secondary | ICD-10-CM | POA: Diagnosis present

## 2014-04-12 DIAGNOSIS — K5669 Other intestinal obstruction: Secondary | ICD-10-CM | POA: Diagnosis not present

## 2014-04-12 DIAGNOSIS — R1084 Generalized abdominal pain: Secondary | ICD-10-CM

## 2014-04-12 DIAGNOSIS — K862 Cyst of pancreas: Secondary | ICD-10-CM | POA: Diagnosis present

## 2014-04-12 DIAGNOSIS — Z90710 Acquired absence of both cervix and uterus: Secondary | ICD-10-CM | POA: Diagnosis not present

## 2014-04-12 DIAGNOSIS — F419 Anxiety disorder, unspecified: Secondary | ICD-10-CM | POA: Diagnosis present

## 2014-04-12 DIAGNOSIS — E860 Dehydration: Secondary | ICD-10-CM | POA: Diagnosis present

## 2014-04-12 DIAGNOSIS — N3281 Overactive bladder: Secondary | ICD-10-CM | POA: Diagnosis present

## 2014-04-12 DIAGNOSIS — E1165 Type 2 diabetes mellitus with hyperglycemia: Secondary | ICD-10-CM | POA: Diagnosis present

## 2014-04-12 DIAGNOSIS — J9610 Chronic respiratory failure, unspecified whether with hypoxia or hypercapnia: Secondary | ICD-10-CM | POA: Diagnosis present

## 2014-04-12 DIAGNOSIS — N189 Chronic kidney disease, unspecified: Secondary | ICD-10-CM | POA: Diagnosis present

## 2014-04-12 DIAGNOSIS — D638 Anemia in other chronic diseases classified elsewhere: Secondary | ICD-10-CM | POA: Diagnosis present

## 2014-04-12 DIAGNOSIS — N390 Urinary tract infection, site not specified: Secondary | ICD-10-CM | POA: Diagnosis present

## 2014-04-12 LAB — COMPREHENSIVE METABOLIC PANEL
ALBUMIN: 3.8 g/dL (ref 3.5–5.2)
ALT: 33 U/L (ref 0–35)
ALT: 31 U/L (ref 0–35)
AST: 54 U/L — AB (ref 0–37)
AST: 52 U/L — ABNORMAL HIGH (ref 0–37)
Albumin: 3.4 g/dL — ABNORMAL LOW (ref 3.5–5.2)
Alkaline Phosphatase: 121 U/L — ABNORMAL HIGH (ref 39–117)
Alkaline Phosphatase: 107 U/L (ref 39–117)
Anion gap: 6 (ref 5–15)
Anion gap: 5 (ref 5–15)
BILIRUBIN TOTAL: 0.7 mg/dL (ref 0.3–1.2)
BUN: 32 mg/dL — ABNORMAL HIGH (ref 6–23)
BUN: 30 mg/dL — ABNORMAL HIGH (ref 6–23)
CALCIUM: 10.5 mg/dL (ref 8.4–10.5)
CO2: 24 mmol/L (ref 19–32)
CO2: 28 mmol/L (ref 19–32)
CREATININE: 1.38 mg/dL — AB (ref 0.50–1.10)
Calcium: 10.8 mg/dL — ABNORMAL HIGH (ref 8.4–10.5)
Chloride: 104 mmol/L (ref 96–112)
Chloride: 105 mmol/L (ref 96–112)
Creatinine, Ser: 1.26 mg/dL — ABNORMAL HIGH (ref 0.50–1.10)
GFR calc Af Amer: 46 mL/min — ABNORMAL LOW (ref >90)
GFR calc non Af Amer: 45 mL/min — ABNORMAL LOW (ref >90)
GFR, EST AFRICAN AMERICAN: 52 mL/min — AB (ref >90)
GFR, EST NON AFRICAN AMERICAN: 40 mL/min — AB (ref >90)
GLUCOSE: 69 mg/dL — AB (ref 70–99)
Glucose, Bld: 219 mg/dL — ABNORMAL HIGH (ref 70–99)
Potassium: 4.2 mmol/L (ref 3.5–5.1)
Potassium: 4.1 mmol/L (ref 3.5–5.1)
SODIUM: 138 mmol/L (ref 135–145)
Sodium: 134 mmol/L — ABNORMAL LOW (ref 135–145)
TOTAL PROTEIN: 6.9 g/dL (ref 6.0–8.3)
Total Bilirubin: 0.5 mg/dL (ref 0.3–1.2)
Total Protein: 7.6 g/dL (ref 6.0–8.3)

## 2014-04-12 LAB — TYPE AND SCREEN
ABO/RH(D): A POS
Antibody Screen: NEGATIVE

## 2014-04-12 LAB — GLUCOSE, CAPILLARY
GLUCOSE-CAPILLARY: 85 mg/dL (ref 70–99)
GLUCOSE-CAPILLARY: 201 mg/dL — AB (ref 70–99)
Glucose-Capillary: 105 mg/dL — ABNORMAL HIGH (ref 70–99)
Glucose-Capillary: 225 mg/dL — ABNORMAL HIGH (ref 70–99)
Glucose-Capillary: 296 mg/dL — ABNORMAL HIGH (ref 70–99)
Glucose-Capillary: 73 mg/dL (ref 70–99)

## 2014-04-12 LAB — URINE MICROSCOPIC-ADD ON

## 2014-04-12 LAB — CBC WITH DIFFERENTIAL/PLATELET
BASOS ABS: 0.3 10*3/uL — AB (ref 0.0–0.1)
Basophils Absolute: 0.1 10*3/uL (ref 0.0–0.1)
Basophils Relative: 1 % (ref 0–1)
Basophils Relative: 2 % — ABNORMAL HIGH (ref 0–1)
Eosinophils Absolute: 1 10*3/uL — ABNORMAL HIGH (ref 0.0–0.7)
Eosinophils Absolute: 1.1 10*3/uL — ABNORMAL HIGH (ref 0.0–0.7)
Eosinophils Relative: 8 % — ABNORMAL HIGH (ref 0–5)
Eosinophils Relative: 9 % — ABNORMAL HIGH (ref 0–5)
HCT: 27.3 % — ABNORMAL LOW (ref 36.0–46.0)
HEMATOCRIT: 29.3 % — AB (ref 36.0–46.0)
HEMOGLOBIN: 8.8 g/dL — AB (ref 12.0–15.0)
Hemoglobin: 9.2 g/dL — ABNORMAL LOW (ref 12.0–15.0)
LYMPHS ABS: 3.5 10*3/uL (ref 0.7–4.0)
Lymphocytes Relative: 22 % (ref 12–46)
Lymphocytes Relative: 28 % (ref 12–46)
Lymphs Abs: 2.8 10*3/uL (ref 0.7–4.0)
MCH: 28.7 pg (ref 26.0–34.0)
MCH: 29.4 pg (ref 26.0–34.0)
MCHC: 31.4 g/dL (ref 30.0–36.0)
MCHC: 32.2 g/dL (ref 30.0–36.0)
MCV: 91.3 fL (ref 78.0–100.0)
MCV: 91.3 fL (ref 78.0–100.0)
MONO ABS: 2.6 10*3/uL — AB (ref 0.1–1.0)
MONOS PCT: 21 % — AB (ref 3–12)
Monocytes Absolute: 2.4 10*3/uL — ABNORMAL HIGH (ref 0.1–1.0)
Monocytes Relative: 19 % — ABNORMAL HIGH (ref 3–12)
NEUTROS ABS: 6.5 10*3/uL (ref 1.7–7.7)
NEUTROS PCT: 50 % (ref 43–77)
Neutro Abs: 5 10*3/uL (ref 1.7–7.7)
Neutrophils Relative %: 40 % — ABNORMAL LOW (ref 43–77)
Platelets: ADEQUATE 10*3/uL (ref 150–400)
Platelets: 374 10*3/uL (ref 150–400)
RBC: 3.21 MIL/uL — ABNORMAL LOW (ref 3.87–5.11)
RBC: 2.99 MIL/uL — ABNORMAL LOW (ref 3.87–5.11)
RDW: 14.5 % (ref 11.5–15.5)
RDW: 14.7 % (ref 11.5–15.5)
WBC: 12.8 10*3/uL — ABNORMAL HIGH (ref 4.0–10.5)
WBC: 12.5 10*3/uL — AB (ref 4.0–10.5)

## 2014-04-12 LAB — URINALYSIS, ROUTINE W REFLEX MICROSCOPIC
BILIRUBIN URINE: NEGATIVE
GLUCOSE, UA: NEGATIVE mg/dL
KETONES UR: NEGATIVE mg/dL
Nitrite: NEGATIVE
PROTEIN: 30 mg/dL — AB
Specific Gravity, Urine: 1.013 (ref 1.005–1.030)
Urobilinogen, UA: 0.2 mg/dL (ref 0.0–1.0)
pH: 5.5 (ref 5.0–8.0)

## 2014-04-12 LAB — PROTIME-INR
INR: 1.05 (ref 0.00–1.49)
Prothrombin Time: 13.8 seconds (ref 11.6–15.2)

## 2014-04-12 LAB — VITAMIN B12: Vitamin B-12: 1165 pg/mL — ABNORMAL HIGH (ref 211–911)

## 2014-04-12 LAB — IRON AND TIBC
IRON: 69 ug/dL (ref 42–145)
Saturation Ratios: 18 % — ABNORMAL LOW (ref 20–55)
TIBC: 373 ug/dL (ref 250–470)
UIBC: 304 ug/dL (ref 125–400)

## 2014-04-12 LAB — FOLATE: Folate: >20 ng/mL

## 2014-04-12 LAB — AMMONIA
AMMONIA: 97 umol/L — AB (ref 11–32)
Ammonia: 101 umol/L — ABNORMAL HIGH (ref 11–32)
Ammonia: 59 umol/L — ABNORMAL HIGH (ref 11–32)
Ammonia: 56 umol/L — ABNORMAL HIGH (ref 11–32)

## 2014-04-12 LAB — RETICULOCYTES
RBC.: 2.97 MIL/uL — ABNORMAL LOW (ref 3.87–5.11)
Retic Count, Absolute: 53.5 10*3/uL (ref 19.0–186.0)
Retic Ct Pct: 1.8 % (ref 0.4–3.1)

## 2014-04-12 LAB — APTT: aPTT: 30 seconds (ref 24–37)

## 2014-04-12 LAB — FERRITIN: Ferritin: 27 ng/mL (ref 10–291)

## 2014-04-12 LAB — LIPASE, BLOOD: LIPASE: 42 U/L (ref 11–59)

## 2014-04-12 LAB — MRSA PCR SCREENING: MRSA BY PCR: NEGATIVE

## 2014-04-12 LAB — BRAIN NATRIURETIC PEPTIDE: B Natriuretic Peptide: 311.9 pg/mL — ABNORMAL HIGH (ref 0.0–100.0)

## 2014-04-12 MED ORDER — SODIUM CHLORIDE 0.9 % IV SOLN
INTRAVENOUS | Status: DC
  Administered 2014-04-12 – 2014-04-13 (×2): via INTRAVENOUS

## 2014-04-12 MED ORDER — SODIUM CHLORIDE 0.9 % IJ SOLN
3.0000 mL | Freq: Two times a day (BID) | INTRAMUSCULAR | Status: DC
  Administered 2014-04-12 – 2014-04-21 (×6): 3 mL via INTRAVENOUS

## 2014-04-12 MED ORDER — HEPARIN SODIUM (PORCINE) 5000 UNIT/ML IJ SOLN
5000.0000 [IU] | Freq: Three times a day (TID) | INTRAMUSCULAR | Status: DC
  Filled 2014-04-12 (×2): qty 1

## 2014-04-12 MED ORDER — ONDANSETRON HCL 4 MG PO TABS
4.0000 mg | ORAL_TABLET | Freq: Four times a day (QID) | ORAL | Status: DC | PRN
  Administered 2014-04-12 – 2014-04-13 (×2): 4 mg via ORAL
  Filled 2014-04-12 (×3): qty 1

## 2014-04-12 MED ORDER — ONDANSETRON HCL 4 MG/2ML IJ SOLN: 4.0000 mg | Freq: Four times a day (QID) | INTRAMUSCULAR | Status: DC | PRN

## 2014-04-12 MED ORDER — CIPROFLOXACIN IN D5W 400 MG/200ML IV SOLN
400.0000 mg | Freq: Two times a day (BID) | INTRAVENOUS | Status: DC
  Administered 2014-04-12 – 2014-04-14 (×5): 400 mg via INTRAVENOUS
  Filled 2014-04-12 (×5): qty 200

## 2014-04-12 MED ORDER — ALBUTEROL SULFATE (2.5 MG/3ML) 0.083% IN NEBU: 2.5000 mg | INHALATION_SOLUTION | RESPIRATORY_TRACT | Status: DC | PRN

## 2014-04-12 MED ORDER — ISOSORBIDE DINITRATE 20 MG PO TABS
20.0000 mg | ORAL_TABLET | Freq: Two times a day (BID) | ORAL | Status: DC
  Administered 2014-04-12 – 2014-04-21 (×20): 20 mg via ORAL
  Filled 2014-04-12 (×26): qty 1

## 2014-04-12 MED ORDER — IOHEXOL 300 MG/ML  SOLN
25.0000 mL | INTRAMUSCULAR | Status: AC
  Administered 2014-04-12 (×2): 25 mL via ORAL

## 2014-04-12 MED ORDER — RIFAXIMIN 550 MG PO TABS
550.0000 mg | ORAL_TABLET | Freq: Two times a day (BID) | ORAL | Status: DC
  Administered 2014-04-12 – 2014-04-21 (×19): 550 mg via ORAL
  Filled 2014-04-12 (×23): qty 1

## 2014-04-12 MED ORDER — POTASSIUM CHLORIDE ER 10 MEQ PO TBCR
10.0000 meq | EXTENDED_RELEASE_TABLET | Freq: Every day | ORAL | Status: DC
  Administered 2014-04-12 – 2014-04-21 (×10): 10 meq via ORAL
  Filled 2014-04-12 (×19): qty 1

## 2014-04-12 MED ORDER — PROMETHAZINE HCL 25 MG PO TABS: 25.0000 mg | ORAL_TABLET | ORAL | Status: DC | PRN

## 2014-04-12 MED ORDER — LOSARTAN POTASSIUM 50 MG PO TABS
50.0000 mg | ORAL_TABLET | Freq: Every day | ORAL | Status: DC
  Administered 2014-04-12 – 2014-04-21 (×10): 50 mg via ORAL
  Filled 2014-04-12 (×10): qty 1

## 2014-04-12 MED ORDER — OXYCODONE HCL 5 MG PO TABS
20.0000 mg | ORAL_TABLET | ORAL | Status: DC | PRN
  Administered 2014-04-12 – 2014-04-21 (×51): 20 mg via ORAL
  Filled 2014-04-12 (×53): qty 4

## 2014-04-12 MED ORDER — DEXTROSE-NACL 5-0.45 % IV SOLN
INTRAVENOUS | Status: AC
  Administered 2014-04-12: 03:00:00 via INTRAVENOUS

## 2014-04-12 MED ORDER — IPRATROPIUM-ALBUTEROL 0.5-2.5 (3) MG/3ML IN SOLN: 3.0000 mL | RESPIRATORY_TRACT | Status: DC | PRN

## 2014-04-12 MED ORDER — HYDRALAZINE HCL 25 MG PO TABS
37.5000 mg | ORAL_TABLET | Freq: Two times a day (BID) | ORAL | Status: DC
  Administered 2014-04-12 – 2014-04-21 (×20): 37.5 mg via ORAL
  Filled 2014-04-12 (×20): qty 2

## 2014-04-12 MED ORDER — LACTULOSE 10 GM/15ML PO SOLN
30.0000 g | Freq: Three times a day (TID) | ORAL | Status: DC
  Administered 2014-04-12 – 2014-04-14 (×9): 30 g via ORAL
  Filled 2014-04-12 (×9): qty 60

## 2014-04-12 MED ORDER — LACTULOSE 10 GM/15ML PO SOLN
30.0000 g | Freq: Three times a day (TID) | ORAL | Status: DC
  Filled 2014-04-12 (×3): qty 45

## 2014-04-12 MED ORDER — INSULIN ASPART 100 UNIT/ML ~~LOC~~ SOLN
0.0000 [IU] | SUBCUTANEOUS | Status: DC
  Administered 2014-04-12: 3 [IU] via SUBCUTANEOUS
  Administered 2014-04-12: 5 [IU] via SUBCUTANEOUS
  Administered 2014-04-13 (×2): 2 [IU] via SUBCUTANEOUS
  Administered 2014-04-13: 7 [IU] via SUBCUTANEOUS
  Administered 2014-04-13 (×3): 3 [IU] via SUBCUTANEOUS
  Administered 2014-04-14: 5 [IU] via SUBCUTANEOUS
  Administered 2014-04-14: 7 [IU] via SUBCUTANEOUS
  Administered 2014-04-14: 3 [IU] via SUBCUTANEOUS
  Administered 2014-04-14: 5 [IU] via SUBCUTANEOUS
  Administered 2014-04-14: 7 [IU] via SUBCUTANEOUS
  Administered 2014-04-14 – 2014-04-15 (×2): 5 [IU] via SUBCUTANEOUS
  Administered 2014-04-15: 3 [IU] via SUBCUTANEOUS
  Administered 2014-04-15: 7 [IU] via SUBCUTANEOUS
  Administered 2014-04-15: 3 [IU] via SUBCUTANEOUS
  Administered 2014-04-15: 2 [IU] via SUBCUTANEOUS
  Administered 2014-04-15 – 2014-04-16 (×2): 3 [IU] via SUBCUTANEOUS
  Administered 2014-04-16: 5 [IU] via SUBCUTANEOUS
  Administered 2014-04-16 (×2): 3 [IU] via SUBCUTANEOUS

## 2014-04-12 MED ORDER — FUROSEMIDE 40 MG PO TABS
80.0000 mg | ORAL_TABLET | Freq: Two times a day (BID) | ORAL | Status: DC
  Administered 2014-04-12 – 2014-04-14 (×5): 80 mg via ORAL
  Filled 2014-04-12 (×5): qty 2

## 2014-04-12 MED ORDER — PANTOPRAZOLE SODIUM 40 MG PO TBEC
80.0000 mg | DELAYED_RELEASE_TABLET | Freq: Every day | ORAL | Status: DC
  Administered 2014-04-12 – 2014-04-21 (×10): 80 mg via ORAL
  Filled 2014-04-12 (×10): qty 2

## 2014-04-12 MED ORDER — METOCLOPRAMIDE HCL 10 MG PO TABS
10.0000 mg | ORAL_TABLET | Freq: Three times a day (TID) | ORAL | Status: DC
  Administered 2014-04-12 – 2014-04-21 (×27): 10 mg via ORAL
  Filled 2014-04-12 (×27): qty 1

## 2014-04-12 MED ORDER — IPRATROPIUM-ALBUTEROL 18-103 MCG/ACT IN AERO: 2.0000 | INHALATION_SPRAY | RESPIRATORY_TRACT | Status: DC | PRN

## 2014-04-12 MED ORDER — MOMETASONE FURO-FORMOTEROL FUM 100-5 MCG/ACT IN AERO
2.0000 | INHALATION_SPRAY | Freq: Two times a day (BID) | RESPIRATORY_TRACT | Status: DC
  Administered 2014-04-12 – 2014-04-21 (×18): 2 via RESPIRATORY_TRACT
  Filled 2014-04-12 (×2): qty 8.8

## 2014-04-12 NOTE — Consult Note (Signed)
WOC ostomy consult note Pt with established ostomy, surgery performed at [facility] per records. Admitted with fatigue. Hx cirrhosis, pacemaker, CHF, lung CA, COPD.   Stoma type/location: LLQ, end colostomy Stomal assessment/size:  Peristomal assessment:  Treatment options for stomal/peristomal skin: pt uses paste at home will add barrier ring today instead since we do not use paste inpatient Output pasty, brown effluent Ostomy pouching: 1pc.convex CTF used at home with belt.  Continue with that pouching system while inpatient.  I have ordered supplies for use at the bedside and changed pouch today since it was leaking.  Education provided: pt with established stoma for many years, independent at home with her care.   Discussed POC with patient and bedside nurse.  Re consult if needed, will not follow at this time. Thanks  Keaghan Staton Kellogg, [place] [phone])

## 2014-04-12 NOTE — ED Notes (Signed)
Resting quietly with eye closed. Easily arousable. Verbally responsive. Resp even and unlabored. ABC's intact. IV saline lock patent and intact. Family at bedside.

## 2014-04-12 NOTE — Progress Notes (Signed)
Patient states that she is not suppose to take any blood thinners so she refused her heparin this am.  Will continue to monitor patient.

## 2014-04-12 NOTE — Progress Notes (Addendum)
Patient seen and examined, admitted by Dr. Ernestina Patches this morning  Briefly 64 year old female with multiple medical problems including Nash, diastolic CHF, colostomy after colon perforation, lung cancer, COPD presented with confusion progressively worsening over the past 6 months. Patient has a history of Karlene Lineman, followed by PCP however patient had not recently followed by GI.  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 I called gastroenterology consult, patient has been seen by Dr. [name] in the past  UTI: Follow urine cx, cont IV cipro  D/w patient's daugtet at bedside in detail, advance diet Will follow closely   Dereon Williamsen M.D. [facility] 04/12/2014, 1:21 PM  Pager: [phone]

## 2014-04-12 NOTE — Progress Notes (Signed)
CSW received consult "Per health history". CSW spoke with patient's RN, Sammuel Hines and confirmed that patient was admitted from home and plan is to return home at discharge.   No further CSW needs identified - CSW signing off.   Raynaldo Opitz, [facility] Clinical Social Worker cell #: [phone]

## 2014-04-12 NOTE — ED Notes (Signed)
Resting quietly with eye closed. Easily arousable. Verbally responsive. Resp even and unlabored. ABC's intact. Paced rhythm on monitor.

## 2014-04-12 NOTE — Consult Note (Signed)
Referring Provider: No ref. provider found Primary Care Physician:  [name], MD Primary Gastroenterologist:  Dr. [name]  Reason for Consultation:  NASH cirrhosis; HE; abdominal pain  HPI: [name] is a 64 y.o. female with significant past medical history of multiple medical problems including nonalcoholic cirrhosis, cardiac arrythmia s/p pacemaker, diastolic CHF, s/p colostomy for perforation during colonosocpy, lung cancer, COPD on home O2.  She presented to [facility] hospital on 3/14 wiith encephalopathy/confusion and abd pain. Patient apparently has had progressively worsening confusion over the past 6 months, but much worse over the past week or so.  Has been followed by the PCP.  Is established with [facility], but has not been seen in our office since 10/2013.  Apparently had an ammonia level performed as outpatient and was directed to come to the ED because it was elevated at 97.  Presented to the ER afebrile, hemodynamically stable. White blood cell count 12.8, hemoglobin 9.2, creatinine 1.38, sodium 134. Repeat ammonia level 101. Head CT and KUB with no acute findings.    She tells me that her abdomen has been very painful for about the past month and her appetite has been poor.  She was found to have a mass on her right ovary recently so is being evaluated for that.  I believe that she has some underlying chronic abdominal pain as well, however.  Had CT scan abdomen and pelvis without contrast this AM, which is still pending.  Just of note, she's had chronic bleeding issues via her ostomy for at least 3 years.  No bleeding currently.  Colonoscopy in February 2013 via her ostomy site showed mild inflammation of the mucosa of the ostomy stump, but was otherwise normal. Wireless capsule endoscopy in February 2013 showed a few tiny red spots throughout the small bowel, but no definite sign of gastrointestinal bleeding. She had another EGD in March 2014 at [facility] which revealed portal  hypertensive gastropathy; it was indicated that this could be a source of bleeding.  Then EGD in 10/2013 by Dr. [name] was normal.   Past Medical History  Diagnosis Date  . Cirrhosis   . GERD (gastroesophageal reflux disease)   . Cervical disc syndrome     trouble turnng neck at times  . Chronic respiratory failure   . Diverticulitis of colon   . Perforation of colon   . IBS (irritable bowel syndrome)   . Chronic lower GI bleeding   . Overactive bladder   . Thrombocytopenia     sees Dr. Julien Nordmann   . Splenomegaly   . Depression   . Hypertension   . Asthma   . Heart murmur   . Hyperlipidemia   . COPD (chronic obstructive pulmonary disease)     sees Dr. Gwenette Greet   . Colostomy care   . Hypercalcemia   . CHF (congestive heart failure)   . Pacemaker 12/14/2013  . 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 Cervical cancer many years ago  . Complication of anesthesia     woke up during colonscopy and endoscopy in past  . History of blood transfusion "several"    "bleeding via ostomy" (01/04/2014)  . On home oxygen therapy     "2L; 24/7" (01/04/2014)  . Pneumonia "1-2 times"  . Chronic bronchitis "several times"  . Sleep apnea 2012    mild, no cpap needed  . Type II diabetes mellitus     sees Dr. Cruzita Lederer   .  Chronic disease anemia     sees Dr. Julien Nordmann, due to chronic disease and GI losses   . History of stomach ulcers   . Migraine     "last one was several years ago" (01/04/2014)  . Arthritis     "tailbone; hands; legs" (01/04/2014)  . Anxiety   . Pericardial effusion 2007, 2015.   [name] Chronic abdominal pain     IBS  . Cirrhosis of liver     stage 4    Past Surgical History  Procedure Laterality Date  . Colostomy  11/06/2005  . Lung removal, partial Left 2004    upper lobe removed  . Left colectomy  11/06/2005    Hartmann resection of sigmoid colon and end colostomy.  . Esophagogastroduodenoscopy  03/19/2011    Procedure: ESOPHAGOGASTRODUODENOSCOPY  (EGD);  Surgeon: [name], MD;  Location: [facility];  Service: Endoscopy;  Laterality: N/A;  . Colonoscopy  03/19/2011    Procedure: COLONOSCOPY;  Surgeon: [name], MD;  Location: [facility] ENDOSCOPY;  Service: Endoscopy;  Laterality: N/A;  . Givens capsule study  03/20/2011    Procedure: GIVENS CAPSULE STUDY;  Surgeon: [name], MD;  Location: [facility] ENDOSCOPY;  Service: Endoscopy;  Laterality: N/A;  . Small bowel obstruction repair  March 2012  . Umbilical hernia repair  March 2012  . Splenectomy, total N/A 02/24/2013    Procedure: SPLENECTOMY;  Surgeon: Harl Bowie, MD;  Location: [place];  Service: General;  Laterality: N/A;  . Orif patella Left 04/21/2013    Procedure: OPEN REDUCTION INTERNAL (ORIF) FIXATION PATELLA;  Surgeon: Augustin Schooling, MD;  Location: [place];  Service: Orthopedics;  Laterality: Left;  . Pacemaker insertion  2015  . Esophagogastroduodenoscopy (egd) with propofol N/A 11/02/2013    Procedure: ESOPHAGOGASTRODUODENOSCOPY (EGD) WITH PROPOFOL;  Surgeon: [name], MD;  Location: [facility];  Service: Endoscopy;  Laterality: N/A;  EGD with APC  . Hot hemostasis N/A 11/02/2013    Procedure: HOT HEMOSTASIS (ARGON PLASMA COAGULATION/BICAP);  Surgeon: [name], MD;  Location: [facility];  Service: Endoscopy;  Laterality: N/A;  . Colon surgery    . Hernia repair    . Fracture surgery    . Abdominal hysterectomy    . Tubal ligation    . Tonsillectomy  1959  . Appendectomy  1962  . Cholecystectomy    . Cardiac catheterization  1967  . Permanent pacemaker insertion N/A 09/07/2013    Procedure: PERMANENT PACEMAKER INSERTION;  Surgeon: Evans Lance, MD;  Location: [facility] CATH LAB;  Service: Cardiovascular;  Laterality: N/A;  . Tee without cardioversion N/A 01/06/2014    Procedure: TRANSESOPHAGEAL ECHOCARDIOGRAM (TEE);  Surgeon: Lelon Perla, MD;  Location: [facility] ENDOSCOPY;  Service: Cardiovascular;  Laterality: N/A;  . Colostomy  2007    Prior to  Admission medications   Medication Sig Start Date End Date Taking? Authorizing Provider  atorvastatin (LIPITOR) 20 MG tablet Take 1 tablet (20 mg total) by mouth every morning. 03/02/14  Yes [name], MD  diazepam (VALIUM) 5 MG tablet Take 1 tablet (5 mg total) by mouth 3 (three) times daily as needed. Patient taking differently: Take 5 mg by mouth 3 (three) times daily as needed for anxiety.  03/02/14  Yes [name], MD  dicyclomine (BENTYL) 10 MG capsule Take 10 mg by mouth 3 (three) times daily.    Yes Historical Provider, MD  esomeprazole (NEXIUM) 40 MG capsule Take 1 capsule (40 mg total) by mouth every  evening. 03/02/14  Yes [name], MD  Fluticasone-Salmeterol (ADVAIR) 250-50 MCG/DOSE AEPB Inhale 1 puff into the lungs every 12 (twelve) hours. 03/02/14  Yes [name], MD  folic acid (FOLVITE) 1 MG tablet Take 1 tablet (1 mg total) by mouth every evening. 04/12/13  Yes [name], MD  furosemide (LASIX) 40 MG tablet Take 2 tablets (80 mg total) by mouth 2 (two) times daily. 03/30/14  Yes Liliane Shi, PA-C  hydrALAZINE (APRESOLINE) 25 MG tablet Take 1.5 tablets (37.5 mg total) by mouth 2 (two) times daily. 03/02/14  Yes [name], MD  isosorbide dinitrate (ISORDIL) 20 MG tablet Take 1 tablet (20 mg total) by mouth 2 (two) times daily. 10/13/13  Yes [name], MD  losartan (COZAAR) 50 MG tablet Take 1 tablet (50 mg total) by mouth daily. 03/18/14  Yes Scott Joylene Draft, PA-C  metoCLOPramide (REGLAN) 10 MG tablet Take 1 tablet (10 mg total) by mouth 3 (three) times daily. 04/12/13  Yes [name], MD  metolazone (ZAROXOLYN) 2.5 MG tablet Take 2.5 mg by mouth daily.  03/02/14  Yes Historical Provider, MD  Oxycodone HCl 20 MG TABS Take 1 tablet (20 mg total) by mouth every 3 (three) hours as needed (pain). 03/02/14  Yes [name], MD  potassium chloride (KLOR-CON 10) 10 MEQ tablet Take 1 tablet (10 mEq total) by mouth daily. 03/16/14  Yes [name], MD  promethazine (PHENERGAN) 25  MG tablet Take 1 tablet (25 mg total) by mouth every 4 (four) hours as needed for nausea or vomiting. 11/23/13  Yes [name], MD  albuterol (PROVENTIL) (2.5 MG/3ML) 0.083% nebulizer solution Take 3 mLs (2.5 mg total) by nebulization every 4 (four) hours as needed for wheezing or shortness of breath. 03/02/14   [name], MD  albuterol-ipratropium (COMBIVENT) [phone] MCG/ACT inhaler Inhale 2 puffs into the lungs every 4 (four) hours as needed for wheezing or shortness of breath.     Historical Provider, MD  insulin regular human CONCENTRATED (HUMULIN R) 500 UNIT/ML SOLN injection Inject under skin 0.33 mL before b'fast, 0.33 mL before lunch and 0.30 mL before dinner Patient taking differently: Inject 0.3-0.33 Units into the skin 3 (three) times daily with meals. Inject under skin 0.33 mL before b'fast, 0.33 mL before lunch and 0.30 mL before dinner 03/01/14   Philemon Kingdom, MD  INSULIN SYRINGE 1CC/29G (B-D INSULIN SYRINGE) 29G X 1/2" 1 ML MISC Use to inject insulin 3 times daily. 03/01/14   Philemon Kingdom, MD  methocarbamol (ROBAXIN) 500 MG tablet Take 1 tablet (500 mg total) by mouth every 6 (six) hours as needed for muscle spasms. Patient not taking: Reported on 04/12/2014 05/03/13   Ivan Anchors Love, PA-C  OXYGEN Inhale 2 L into the lungs continuous.    Historical Provider, MD    Current Facility-Administered Medications  Medication Dose Route Frequency Provider Last Rate Last Dose  . 0.9 %  sodium chloride infusion   Intravenous Continuous Deneise Lever, MD      . albuterol (PROVENTIL) (2.5 MG/3ML) 0.083% nebulizer solution 2.5 mg  2.5 mg Nebulization Q4H PRN Deneise Lever, MD      . ciprofloxacin (CIPRO) IVPB 400 mg  400 mg Intravenous Q12H Ripudeep Krystal Eaton, MD   400 mg at 04/12/14 0805  . dextrose 5 %-0.45 % sodium chloride infusion   Intravenous STAT Dorie Rank, MD 100 mL/hr at 04/12/14 0328    . furosemide (LASIX) tablet  80 mg  80 mg Oral BID Deneise Lever, MD   80 mg at 04/12/14 0802    . heparin injection 5,000 Units  5,000 Units Subcutaneous 3 times per day Deneise Lever, MD   5,000 Units at 04/12/14 0600  . hydrALAZINE (APRESOLINE) tablet 37.5 mg  37.5 mg Oral BID Deneise Lever, MD   37.5 mg at 04/12/14 1010  . insulin aspart (novoLOG) injection 0-9 Units  0-9 Units Subcutaneous 6 times per day Deneise Lever, MD   0 Units at 04/12/14 0400  . ipratropium-albuterol (DUONEB) 0.5-2.5 (3) MG/3ML nebulizer solution 3 mL  3 mL Nebulization Q4H PRN Deneise Lever, MD      . isosorbide dinitrate (ISORDIL) tablet 20 mg  20 mg Oral BID Deneise Lever, MD   20 mg at 04/12/14 1009  . lactulose (CHRONULAC) 10 GM/15ML solution 30 g  30 g Oral TID Deneise Lever, MD   30 g at 04/12/14 1009  . losartan (COZAAR) tablet 50 mg  50 mg Oral Daily Deneise Lever, MD   50 mg at 04/12/14 1011  . metoCLOPramide (REGLAN) tablet 10 mg  10 mg Oral TID Deneise Lever, MD   10 mg at 04/12/14 1010  . mometasone-formoterol (DULERA) 100-5 MCG/ACT inhaler 2 puff  2 puff Inhalation BID Deneise Lever, MD   2 puff at 04/12/14 [phone]  . ondansetron (ZOFRAN) tablet 4 mg  4 mg Oral Q6H PRN Deneise Lever, MD       Or  . ondansetron [facility]) injection 4 mg  4 mg Intravenous Q6H PRN Deneise Lever, MD      . oxyCODONE (Oxy IR/ROXICODONE) immediate release tablet 20 mg  20 mg Oral Q3H PRN Deneise Lever, MD   20 mg at 04/12/14 0802  . pantoprazole (PROTONIX) EC tablet 80 mg  80 mg Oral Q1200 Deneise Lever, MD      . potassium chloride (K-DUR) CR tablet 10 mEq  10 mEq Oral Daily Deneise Lever, MD   10 mEq at 04/12/14 1011  . promethazine (PHENERGAN) tablet 25 mg  25 mg Oral Q4H PRN Deneise Lever, MD      . rifaximin Doreene Nest) tablet 550 mg  550 mg Oral BID Laban Emperor Zehr, PA-C      . sodium chloride 0.9 % injection 3 mL  3 mL Intravenous Q12H Deneise Lever, MD   3 mL at 04/12/14 0245    Allergies as of 04/11/2014 - Review Complete 04/11/2014  Allergen Reaction Noted  . Acetaminophen Other (See  Comments) 06/23/2013  . Morphine Other (See Comments) 09/15/2006  . Morphine and related Other (See Comments) 11/27/2013  . Other Other (See Comments) 02/10/2013  . Penicillins Anaphylaxis and Rash 06/03/2006  . Trazodone and nefazodone Other (See Comments) 09/03/2013  . Codeine phosphate Other (See Comments) 06/03/2006  . Hydrocodone-acetaminophen Other (See Comments) 06/03/2006  . Cephalexin Swelling and Rash 06/03/2006  . Hydrocodone-acetaminophen Other (See Comments) 06/23/2013    Family History  Problem Relation Age of Onset  . Coronary artery disease    . Diabetes type II    . Heart attack Mother   . Diabetes type II Mother   . Cirrhosis Father   . Heart attack Sister   . Pancreatic cancer Mother     secondary from surgery  . Cervical cancer Maternal Grandmother   . Hypertension Father   . Hypertension Sister   . Hypertension Son   .  Hypertension Daughter   . Stroke Neg Hx     History   Social History  . Marital Status: Divorced    Spouse Name: N/A  . Number of Children: N/A  . Years of Education: N/A   Occupational History  . Disabled    Social History Main Topics  . Smoking status: Former Smoker -- 2.00 packs/day for 35 years    Types: Cigarettes    Quit date: 03/24/2002  . Smokeless tobacco: Never Used  . Alcohol Use: No  . Drug Use: No  . Sexual Activity: No   Other Topics Concern  . Not on file   Social History Narrative   Regular exercise: a little   Caffeine use: 2 cups of coffee in am          Review of Systems: Ten point ROS is O/W negative except as mentioned in HPI.  Physical Exam: Vital signs in last 24 hours: Temp:  [97.9 F (36.6 C)-98.8 F (37.1 C)] 97.9 F (36.6 C) (03/15 0517) Pulse Rate:  [59-69] 69 (03/15 0517) Resp:  [11-18] 16 (03/15 0517) BP: (105-145)/(36-57) 121/47 mmHg (03/15 1009) SpO2:  [94 %-100 %] 99 % (03/15 0807) Weight:  [209 lb 1.6 oz (94.847 kg)-212 lb 1.6 oz (96.208 kg)] 209 lb 1.6 oz (94.847 kg) (03/15  0239) Last BM Date: 04/12/14 General:  Alert, Well-developed, well-nourished, pleasant and cooperative in NAD Head:  Normocephalic and atraumatic. Eyes:  Sclera clear, no icterus.  Conjunctiva pink. Ears:  Normal auditory acuity. Mouth:  No deformity or lesions.   Lungs:  Clear throughout to auscultation.  No wheezes, crackles, or rhonchi.  Heart:  Regular rate and rhythm Abdomen:  Soft, BS present.  Diffuse moderate TTP.  Ostomy noted on left side with brown stool.  Rectal:  Deferred  Msk:  Symmetrical without gross deformities. Pulses:  Normal pulses noted. Extremities:  Without clubbing or edema. Neurologic:  Alert and  oriented x4;  grossly normal neurologically.  Has tremor, but negative for asterixis. Skin:  Intact without significant lesions or rashes. Psych:  Alert and cooperative. Tearful because of frustration from not being able to remember things.  Intake/Output from previous day: 03/14 0701 - 03/15 0700 In: 713.3 [P.O.:360; I.V.:353.3] Out: -  Intake/Output this shift: Total I/O In: -  Out: 900 [Urine:900]  Lab Results:  Recent Labs  04/11/14 2246 04/12/14 0710  WBC 12.8* 12.5*  HGB 9.2* 8.8*  HCT 29.3* 27.3*  PLT PLATELET CLUMPS NOTED ON SMEAR, COUNT APPEARS ADEQUATE 374   BMET  Recent Labs  04/11/14 2246 04/12/14 0710  NA 134* 138  K 4.2 4.1  CL 104 105  CO2 24 28  GLUCOSE 219* 69*  BUN 32* 30*  CREATININE 1.38* 1.26*  CALCIUM 10.8* 10.5   LFT  Recent Labs  04/12/14 0710  PROT 6.9  ALBUMIN 3.4*  AST 52*  ALT 31  ALKPHOS 107  BILITOT 0.5   PT/INR  Recent Labs  04/11/14 2252  LABPROT 13.8  INR 1.05   Studies/Results: Ct Head Wo Contrast  04/12/2014   CLINICAL DATA:  Progressive weakness and fatigue for 6 months. Elevated ammonia levels. Initial encounter.  EXAM: CT HEAD WITHOUT CONTRAST  TECHNIQUE: Contiguous axial images were obtained from the base of the skull through the vertex without intravenous contrast.  COMPARISON:  CT of  the head performed 04/21/2013  FINDINGS: There is no evidence of acute infarction, mass lesion, or intra- or extra-axial hemorrhage on CT.  Prominence of the sulci suggests  mild cortical volume loss. Mild cerebellar atrophy is noted.  The brainstem and fourth ventricle are within normal limits. The basal ganglia are unremarkable in appearance. The cerebral hemispheres demonstrate grossly normal gray-white differentiation. No mass effect or midline shift is seen.  There is no evidence of fracture; visualized osseous structures are unremarkable in appearance. The orbits are within normal limits. A mucus retention cyst or polyp is noted at the right maxillary sinus. The remaining paranasal sinuses and mastoid air cells are well-aerated. No significant soft tissue abnormalities are seen.  IMPRESSION: 1. No acute intracranial pathology seen on CT. 2. Mild cortical volume loss noted. 3. Mucus retention cyst or polyp at the right maxillary sinus.   Electronically Signed   By: Garald Balding M.D.   On: 04/12/2014 00:17   Dg Abd Acute W/chest  04/11/2014   CLINICAL DATA:  Upper abdominal pain for 2 months, worse tonight.  EXAM: ACUTE ABDOMEN SERIES (ABDOMEN 2 VIEW & CHEST 1 VIEW)  COMPARISON:  03/04/2014  FINDINGS: The upright view the chest demonstrates moderate unchanged cardiomegaly. Moderate vascular congestion is present, without alveolar edema or infiltrate. Supine and left lateral decubitus views of the abdomen are negative for obstruction or perforation.  IMPRESSION: Negative abdominal radiographs. Moderate vascular congestion. Unchanged cardiomegaly.   Electronically Signed   By: Andreas Newport M.D.   On: 04/11/2014 23:57    IMPRESSION:  -NASH cirrhosis with HE, which appears to be a new issue but she says that she thinks she was on xifaxan in the past.  Ammonia elevated at 101 with confusion on admission.  Improved with lactulose. -Abdominal pain:  Acute on chronic.  CT scan abdomen and pelvis without  contrast pending.  Not sure if she has ascites, but if CT shows this then she should have paracentesis to rule out SBP unless other obvious source of pain is found. -Chronic bleeding via ostomy:  Has been evaluated extensively in the past.  No overt bleeding currently.  PLAN: -Await results of CT scan. -Continue lactulose 30 grams TID for now and will add xifaxan 550 mg BID as well. -Will give clear liquids for now.  ZEHR, JESSICA D.  04/12/2014, 11:47 AM  Pager number [phone]  GI ATTENDING  History, laboratories, x-rays reviewed. Patient seen and examined. Agree with H&P as outlined above. Patient with hepatic cirrhosis. Appears to present with mild encephalopathy. Better with lactulose. Now adding Xifaxan. Complains of abdominal pain with nonspecific physical exam. CT scan (personally reviewed) negative for acute findings. Does have a history of chronic pain. If tolerating diet and until status good, can go home tomorrow. Would send her out on lactulose to achieve 3-4 bowel movements per day and Xifaxan 550 mg twice a day. Can follow-up with Dr. [name] as needed for her GI care. Please call for questions. Will sign off.  Docia Chuck. Geri Seminole., M.D. [facility] Division of Gastroenterology

## 2014-04-12 NOTE — H&P (Addendum)
Hospitalist Admission History and Physical  Patient name: [name] Medical record number: [id number] Date of birth: December 15, 1950 Age: 64 y.o. Gender: female  Primary Care Provider: [name], MD  Chief Complaint: encephalopathy, abd pain  History of Present Illness:This is a 64 y.o. year old female with significant past medical history of multiple medical problems including nonalcoholic cirrhotic disease, cardiac arrythmia s/p pacemaker, diastolic CHF, s/p colostomy, lung cancer, COPD  presenting with encephalopathy, abd pain. Per the family, patient has had progressively worsening confusion over the past 6 months. Has a baseline history of nonalcoholic cirrhotic disease. Has been followed by the PCP. Per the family, they are in the process of being established with a local GI doctor. However this has not yet been done. Per the family, with significantly worse confusion over the past week or so. No noted fever just chills. No noted nausea vomiting or diarrhea. Was seen by PCP for noted confusion and was redirected to the ER for further evaluation. Presented to the ER afebrile, hemodynamically stable. White blood cell count 12.8, hemoglobin 9.2, creatinine 1.38, sodium 134. Ammonia level 90-100. Head CT and KUB with no acute findings.  Assessment and Plan:  Active Problems:   Hepatic encephalopathy   Encephalopathy   1- Encephalopathy -hepatic in etiology -lactulose -trend ammonia -GI c/s in am   2-Abd Pain  -noted generalized abd pain on exam -CT abd and pelvis to correlate.  -AST minimally to mildly elevated @54 , ALP increased @ 121.  -follow  3-CKD  -CR at baseline -euvolemic on exam  -follow  4-Anemia -Likely anemia of chronic disease in setting of above -anemia panel  -hemoccult  -follow  5-Diastolic CHF -euvolemic to mildly dry on exam -BNP to correlate -gentle hydration -cont home regimen  6-Colostomy  -stable  7CAD/Cardiac  arrhythmia -stable -cont home regimen  -no reported CP -follow -tele bed  9-Leukocytosis -noted elevated WBC -no overt signs of infection thus far -f/u UA -treat as clinically indicated   62-[id number] -nonalcoholic in etiology per family -has not yet formally seen by GI  -treat as above -GI c/s in am    FEN/GI: NPO for now  Prophylaxis: sub q heparin  Disposition: pending further evaluation  Code Status:Full Code    Patient Active Problem List   Diagnosis Date Noted  . Hepatic encephalopathy 04/12/2014  . Encephalopathy 04/12/2014  . Arm swelling 02/22/2014  . UTI (lower urinary tract infection)   . Streptococcal bacteremia   . Cardiac pacemaker   . Abdominal pain 01/04/2014  . Pacemaker 12/14/2013  . Other and unspecified hyperlipidemia 10/13/2013  . Bleeding from colostomy stoma 09/30/2013  . Anemia due to chronic blood loss 09/30/2013  . Acute on chronic diastolic heart failure 25/63/8937  . Bradycardia 09/06/2013  . Junctional bradycardia 09/03/2013  . Hypotension 09/03/2013  . Hyponatremia 09/02/2013  . Chronic diastolic heart failure 34/28/7681  . Chest pain 09/02/2013  . AV block- transient AVD 07/14/2013  . Unstable angina 07/14/2013  . Acute diastolic heart failure 15/72/6203  . CHF (congestive heart failure) 07/13/2013  . Congestive heart failure 07/13/2013  . Hyperkalemia 05/20/2013  . GI bleed 05/19/2013  . Anxiety state, unspecified 05/03/2013  . Patellar fracture 04/21/2013  . Left patella fracture 04/21/2013  . Hypercalcemia 03/30/2013  . Pneumonia, organism unspecified 03/01/2013  . Splenomegaly 02/24/2013  . HHNC (hyperglycemic hyperosmolar nonketotic coma) 11/14/2012  . AKI (acute kidney injury) 11/14/2012  . Cirrhosis 04/14/2012  . Colitis 04/14/2012  . CAFL (chronic airflow limitation) 04/14/2012  .  H/O disease 04/14/2012  . History of cervical cancer 04/14/2012  . BP (high blood pressure) 04/14/2012  . Cancer of lung, upper lobe  04/14/2012  . Colostomy in place 04/14/2012  . H/O colostomy 04/14/2012  . Unspecified deficiency anemia 03/23/2012  . Acute GI bleeding 03/17/2011  . Chronic respiratory failure 03/17/2011  . Cirrhosis of liver not due to alcohol 03/17/2011  . Thrombocytopenia 03/17/2011  . Anemia 03/17/2011  . Irritable bowel syndrome 03/17/2011  . Hx of cervical cancer 03/17/2011  . CARCINOMA, LUNG, SQUAMOUS CELL 02/26/2007  . DEPRESSION 02/26/2007  . Essential hypertension 02/26/2007  . COPD with emphysema 02/26/2007  . Type 2 diabetes, uncontrolled, with neuropathy 09/15/2006  . GERD 09/15/2006  . HEADACHE 09/15/2006  . COLONIC POLYPS, HX OF 09/15/2006   Past Medical History: Past Medical History  Diagnosis Date  . Cirrhosis   . GERD (gastroesophageal reflux disease)   . Cervical disc syndrome     trouble turnng neck at times  . Chronic respiratory failure   . Diverticulitis of colon   . Perforation of colon   . IBS (irritable bowel syndrome)   . Chronic lower GI bleeding   . Overactive bladder   . Thrombocytopenia     sees Dr. Julien Nordmann   . Splenomegaly   . Depression   . Hypertension   . Asthma   . Heart murmur   . Hyperlipidemia   . COPD (chronic obstructive pulmonary disease)     sees Dr. Gwenette Greet   . Colostomy care   . Hypercalcemia   . CHF (congestive heart failure)   . Pacemaker 12/14/2013  . 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 Cervical cancer many years ago  . Complication of anesthesia     woke up during colonscopy and endoscopy in past  . History of blood transfusion "several"    "bleeding via ostomy" (01/04/2014)  . On home oxygen therapy     "2L; 24/7" (01/04/2014)  . Pneumonia "1-2 times"  . Chronic bronchitis "several times"  . Sleep apnea 2012    mild, no cpap needed  . Type II diabetes mellitus     sees Dr. Cruzita Lederer   . Chronic disease anemia     sees Dr. Julien Nordmann, due to chronic disease and GI losses   . History of stomach ulcers    . Migraine     "last one was several years ago" (01/04/2014)  . Arthritis     "tailbone; hands; legs" (01/04/2014)  . Anxiety   . Pericardial effusion 2007, 2015.   [name] Chronic abdominal pain     IBS  . Cirrhosis of liver     stage 4    Past Surgical History: Past Surgical History  Procedure Laterality Date  . Colostomy  11/06/2005  . Lung removal, partial Left 2004    upper lobe removed  . Left colectomy  11/06/2005    Hartmann resection of sigmoid colon and end colostomy.  . Esophagogastroduodenoscopy  03/19/2011    Procedure: ESOPHAGOGASTRODUODENOSCOPY (EGD);  Surgeon: [name], MD;  Location: [facility];  Service: Endoscopy;  Laterality: N/A;  . Colonoscopy  03/19/2011    Procedure: COLONOSCOPY;  Surgeon: [name], MD;  Location: [facility];  Service: Endoscopy;  Laterality: N/A;  . Givens capsule study  03/20/2011    Procedure: GIVENS CAPSULE STUDY;  Surgeon: [name], MD;  Location: [facility];  Service: Endoscopy;  Laterality: N/A;  . Small bowel obstruction repair  March 2012  . Umbilical hernia repair  March 2012  . Splenectomy, total N/A 02/24/2013    Procedure: SPLENECTOMY;  Surgeon: Harl Bowie, MD;  Location: [place];  Service: General;  Laterality: N/A;  . Orif patella Left 04/21/2013    Procedure: OPEN REDUCTION INTERNAL (ORIF) FIXATION PATELLA;  Surgeon: Augustin Schooling, MD;  Location: [place];  Service: Orthopedics;  Laterality: Left;  . Pacemaker insertion  2015  . Esophagogastroduodenoscopy (egd) with propofol N/A 11/02/2013    Procedure: ESOPHAGOGASTRODUODENOSCOPY (EGD) WITH PROPOFOL;  Surgeon: [name], MD;  Location: [facility];  Service: Endoscopy;  Laterality: N/A;  EGD with APC  . Hot hemostasis N/A 11/02/2013    Procedure: HOT HEMOSTASIS (ARGON PLASMA COAGULATION/BICAP);  Surgeon: [name], MD;  Location: [facility];  Service: Endoscopy;  Laterality: N/A;  . Colon surgery    . Hernia repair    . Fracture surgery     . Abdominal hysterectomy    . Tubal ligation    . Tonsillectomy  1959  . Appendectomy  1962  . Cholecystectomy    . Cardiac catheterization  1967  . Permanent pacemaker insertion N/A 09/07/2013    Procedure: PERMANENT PACEMAKER INSERTION;  Surgeon: Evans Lance, MD;  Location: [facility] CATH LAB;  Service: Cardiovascular;  Laterality: N/A;  . Tee without cardioversion N/A 01/06/2014    Procedure: TRANSESOPHAGEAL ECHOCARDIOGRAM (TEE);  Surgeon: Lelon Perla, MD;  Location[facility] ENDOSCOPY;  Service: Cardiovascular;  Laterality: N/A;  . Colostomy  2007    Social History: History   Social History  . Marital Status: Divorced    Spouse Name: N/A  . Number of Children: N/A  . Years of Education: N/A   Occupational History  . Disabled    Social History Main Topics  . Smoking status: Former Smoker -- 2.00 packs/day for 35 years    Types: Cigarettes    Quit date: 03/24/2002  . Smokeless tobacco: Never Used  . Alcohol Use: No  . Drug Use: No  . Sexual Activity: No   Other Topics Concern  . None   Social History Narrative   Regular exercise: a little   Caffeine use: 2 cups of coffee in am          Family History: Family History  Problem Relation Age of Onset  . Coronary artery disease    . Diabetes type II    . Heart attack Mother   . Diabetes type II Mother   . Cirrhosis Father   . Heart attack Sister   . Pancreatic cancer Mother     secondary from surgery  . Cervical cancer Maternal Grandmother   . Hypertension Father   . Hypertension Sister   . Hypertension Son   . Hypertension Daughter   . Stroke Neg Hx     Allergies: Allergies  Allergen Reactions  . Acetaminophen Other (See Comments)    Cirrhosis of liver  . Morphine Other (See Comments)    REACTION: Lowers BP  . Morphine And Related Other (See Comments)    Blood pressure drops   . Other Other (See Comments)    AGENT:  Per pt, CANNOT TAKE ANY FORM OF BLOOD THINNER, due to cirrhosis of the liver  .  Penicillins Anaphylaxis and Rash  . Trazodone And Nefazodone Other (See Comments)    Cardiac arrythmia - DO NOT USE  . Codeine Phosphate Other (See Comments)    REACTION: Stomach cramps  . Hydrocodone-Acetaminophen Other (See Comments)  REACTION: hallucinations  . Cephalexin Swelling and Rash  . Hydrocodone-Acetaminophen Other (See Comments)    unknown    Current Facility-Administered Medications  Medication Dose Route Frequency Provider Last Rate Last Dose  . 0.9 %  sodium chloride infusion  1,000 mL Intravenous Continuous Dorie Rank, MD 125 mL/hr at 04/12/14 0134 1,000 mL at 04/12/14 0134  . 0.9 %  sodium chloride infusion   Intravenous Continuous Deneise Lever, MD      . albuterol (PROVENTIL) (2.5 MG/3ML) 0.083% nebulizer solution 2.5 mg  2.5 mg Nebulization Q4H PRN Deneise Lever, MD      . albuterol-ipratropium (COMBIVENT) inhaler 2 puff  2 puff Inhalation Q4H PRN Deneise Lever, MD      . furosemide (LASIX) tablet 80 mg  80 mg Oral BID Deneise Lever, MD      . heparin injection 5,000 Units  5,000 Units Subcutaneous 3 times per day Deneise Lever, MD      . hydrALAZINE (APRESOLINE) tablet 37.5 mg  37.5 mg Oral BID Deneise Lever, MD      . isosorbide dinitrate (ISORDIL) tablet 20 mg  20 mg Oral BID Deneise Lever, MD      . lactulose (CHRONULAC) 10 GM/15ML solution 30 g  30 g Oral TID Deneise Lever, MD      . losartan (COZAAR) tablet 50 mg  50 mg Oral Daily Deneise Lever, MD      . metoCLOPramide (REGLAN) tablet 10 mg  10 mg Oral TID Deneise Lever, MD      . mometasone-formoterol [facility]) 100-5 MCG/ACT inhaler 2 puff  2 puff Inhalation BID Deneise Lever, MD      . ondansetron [facility]) tablet 4 mg  4 mg Oral Q6H PRN Deneise Lever, MD       Or  . ondansetron [facility]) injection 4 mg  4 mg Intravenous Q6H PRN Deneise Lever, MD      . Oxycodone HCl TABS 20 mg  20 mg Oral Q3H PRN Deneise Lever, MD      . pantoprazole (PROTONIX) EC tablet 80 mg  80 mg Oral Q1200  Deneise Lever, MD      . potassium chloride (K-DUR) CR tablet 10 mEq  10 mEq Oral Daily Deneise Lever, MD      . promethazine (PHENERGAN) tablet 25 mg  25 mg Oral Q4H PRN Deneise Lever, MD      . sodium chloride 0.9 % injection 3 mL  3 mL Intravenous Q12H Deneise Lever, MD       Review Of Systems: 12 point ROS negative except as noted above in HPI.  Physical Exam: Filed Vitals:   04/12/14 0201  BP: 117/57  Pulse: 61  Temp:   Resp: 18    General: cooperative and mildly confused, morbidly obese HEENT: PERRLA and extra ocular movement intact Heart: S1, S2 normal, no murmur, rub or gallop, regular rate and rhythm Lungs: clear to auscultation Abdomen: obese abdomen, + bowel sounds, + generalized TTP  Extremities: venous stasis dermatitis noted and obese LEs Skin:as above  Neurology: intermittently cooperative to exam  Labs and Imaging: Lab Results  Component Value Date/Time   NA 134* 04/11/2014 10:46 PM   NA 138 06/23/2013 08:06 AM   K 4.2 04/11/2014 10:46 PM   K 4.7 06/23/2013 08:06 AM   CL 104 04/11/2014 10:46 PM   CL 103 03/23/2012 09:38 AM   CO2 24 04/11/2014 10:46  PM   CO2 21* 06/23/2013 08:06 AM   BUN 32* 04/11/2014 10:46 PM   BUN 21.7 06/23/2013 08:06 AM   CREATININE 1.38* 04/11/2014 10:46 PM   CREATININE 1.1 06/23/2013 08:06 AM   GLUCOSE 219* 04/11/2014 10:46 PM   GLUCOSE 223* 06/23/2013 08:06 AM   GLUCOSE 319* 03/23/2012 09:38 AM   Lab Results  Component Value Date   WBC 12.8* 04/11/2014   HGB 9.2* 04/11/2014   HCT 29.3* 04/11/2014   MCV 91.3 04/11/2014   PLT  04/11/2014    PLATELET CLUMPS NOTED ON SMEAR, COUNT APPEARS ADEQUATE   Urinalysis    Component Value Date/Time   COLORURINE YELLOW 03/04/2014 2301   APPEARANCEUR CLEAR 03/04/2014 2301   LABSPEC 1.008 03/04/2014 2301   PHURINE 6.0 03/04/2014 2301   GLUCOSEU NEGATIVE 03/04/2014 2301   HGBUR NEGATIVE 03/04/2014 2301   BILIRUBINUR NEGATIVE 03/04/2014 2301   BILIRUBINUR n 11/02/2012 1118    KETONESUR NEGATIVE 03/04/2014 2301   PROTEINUR NEGATIVE 03/04/2014 2301   PROTEINUR n 11/02/2012 1118   UROBILINOGEN 0.2 03/04/2014 2301   UROBILINOGEN 0.2 11/02/2012 1118   NITRITE NEGATIVE 03/04/2014 2301   NITRITE n 11/02/2012 1118   LEUKOCYTESUR TRACE* 03/04/2014 2301       Ct Head Wo Contrast  04/12/2014   CLINICAL DATA:  Progressive weakness and fatigue for 6 months. Elevated ammonia levels. Initial encounter.  EXAM: CT HEAD WITHOUT CONTRAST  TECHNIQUE: Contiguous axial images were obtained from the base of the skull through the vertex without intravenous contrast.  COMPARISON:  CT of the head performed 04/21/2013  FINDINGS: There is no evidence of acute infarction, mass lesion, or intra- or extra-axial hemorrhage on CT.  Prominence of the sulci suggests mild cortical volume loss. Mild cerebellar atrophy is noted.  The brainstem and fourth ventricle are within normal limits. The basal ganglia are unremarkable in appearance. The cerebral hemispheres demonstrate grossly normal gray-white differentiation. No mass effect or midline shift is seen.  There is no evidence of fracture; visualized osseous structures are unremarkable in appearance. The orbits are within normal limits. A mucus retention cyst or polyp is noted at the right maxillary sinus. The remaining paranasal sinuses and mastoid air cells are well-aerated. No significant soft tissue abnormalities are seen.  IMPRESSION: 1. No acute intracranial pathology seen on CT. 2. Mild cortical volume loss noted. 3. Mucus retention cyst or polyp at the right maxillary sinus.   Electronically Signed   By: Garald Balding M.D.   On: 04/12/2014 00:17   Dg Abd Acute W/chest  04/11/2014   CLINICAL DATA:  Upper abdominal pain for 2 months, worse tonight.  EXAM: ACUTE ABDOMEN SERIES (ABDOMEN 2 VIEW & CHEST 1 VIEW)  COMPARISON:  03/04/2014  FINDINGS: The upright view the chest demonstrates moderate unchanged cardiomegaly. Moderate vascular congestion is  present, without alveolar edema or infiltrate. Supine and left lateral decubitus views of the abdomen are negative for obstruction or perforation.  IMPRESSION: Negative abdominal radiographs. Moderate vascular congestion. Unchanged cardiomegaly.   Electronically Signed   By: Andreas Newport M.D.   On: 04/11/2014 23:57           Shanda Howells MD  Pager: [phone]

## 2014-04-13 DIAGNOSIS — E1165 Type 2 diabetes mellitus with hyperglycemia: Secondary | ICD-10-CM

## 2014-04-13 DIAGNOSIS — K7460 Unspecified cirrhosis of liver: Secondary | ICD-10-CM

## 2014-04-13 DIAGNOSIS — E1140 Type 2 diabetes mellitus with diabetic neuropathy, unspecified: Secondary | ICD-10-CM

## 2014-04-13 DIAGNOSIS — I10 Essential (primary) hypertension: Secondary | ICD-10-CM

## 2014-04-13 LAB — CBC WITH DIFFERENTIAL/PLATELET
Basophils Absolute: 0.2 10*3/uL — ABNORMAL HIGH (ref 0.0–0.1)
Basophils Relative: 2 % — ABNORMAL HIGH (ref 0–1)
EOS ABS: 0.9 10*3/uL — AB (ref 0.0–0.7)
Eosinophils Relative: 8 % — ABNORMAL HIGH (ref 0–5)
HCT: 26.8 % — ABNORMAL LOW (ref 36.0–46.0)
Hemoglobin: 8.5 g/dL — ABNORMAL LOW (ref 12.0–15.0)
Lymphocytes Relative: 30 % (ref 12–46)
Lymphs Abs: 3.5 10*3/uL (ref 0.7–4.0)
MCH: 29.1 pg (ref 26.0–34.0)
MCHC: 31.7 g/dL (ref 30.0–36.0)
MCV: 91.8 fL (ref 78.0–100.0)
MONO ABS: 2.4 10*3/uL — AB (ref 0.1–1.0)
Monocytes Relative: 21 % — ABNORMAL HIGH (ref 3–12)
Neutro Abs: 4.5 10*3/uL (ref 1.7–7.7)
Neutrophils Relative %: 39 % — ABNORMAL LOW (ref 43–77)
PLATELETS: 360 10*3/uL (ref 150–400)
RBC: 2.92 MIL/uL — AB (ref 3.87–5.11)
RDW: 14.6 % (ref 11.5–15.5)
WBC: 11.5 10*3/uL — ABNORMAL HIGH (ref 4.0–10.5)

## 2014-04-13 LAB — COMPREHENSIVE METABOLIC PANEL
ALBUMIN: 3.4 g/dL — AB (ref 3.5–5.2)
ALK PHOS: 109 U/L (ref 39–117)
ALT: 31 U/L (ref 0–35)
AST: 50 U/L — AB (ref 0–37)
Anion gap: 11 (ref 5–15)
BUN: 23 mg/dL (ref 6–23)
CALCIUM: 10.2 mg/dL (ref 8.4–10.5)
CO2: 24 mmol/L (ref 19–32)
Chloride: 100 mmol/L (ref 96–112)
Creatinine, Ser: 1.33 mg/dL — ABNORMAL HIGH (ref 0.50–1.10)
GFR calc Af Amer: 49 mL/min — ABNORMAL LOW (ref >90)
GFR calc non Af Amer: 42 mL/min — ABNORMAL LOW (ref >90)
Glucose, Bld: 196 mg/dL — ABNORMAL HIGH (ref 70–99)
POTASSIUM: 4.8 mmol/L (ref 3.5–5.1)
Sodium: 135 mmol/L (ref 135–145)
Total Bilirubin: 0.7 mg/dL (ref 0.3–1.2)
Total Protein: 6.9 g/dL (ref 6.0–8.3)

## 2014-04-13 LAB — URINE CULTURE: Colony Count: 25000

## 2014-04-13 LAB — AMMONIA
AMMONIA: 77 umol/L — AB (ref 11–32)
AMMONIA: 45 umol/L — AB (ref 11–32)

## 2014-04-13 LAB — HEMOGLOBIN A1C
Hgb A1c MFr Bld: 9.6 % — ABNORMAL HIGH (ref 4.8–5.6)
Mean Plasma Glucose: 229 mg/dL

## 2014-04-13 LAB — GLUCOSE, CAPILLARY
GLUCOSE-CAPILLARY: 174 mg/dL — AB (ref 70–99)
Glucose-Capillary: 196 mg/dL — ABNORMAL HIGH (ref 70–99)
Glucose-Capillary: 236 mg/dL — ABNORMAL HIGH (ref 70–99)
Glucose-Capillary: 248 mg/dL — ABNORMAL HIGH (ref 70–99)
Glucose-Capillary: 305 mg/dL — ABNORMAL HIGH (ref 70–99)

## 2014-04-13 MED ORDER — METOLAZONE 2.5 MG PO TABS
2.5000 mg | ORAL_TABLET | Freq: Every day | ORAL | Status: DC
  Administered 2014-04-13: 2.5 mg via ORAL
  Filled 2014-04-13 (×2): qty 1

## 2014-04-13 MED ORDER — FOLIC ACID 1 MG PO TABS
1.0000 mg | ORAL_TABLET | Freq: Every evening | ORAL | Status: DC
  Administered 2014-04-13 – 2014-04-20 (×8): 1 mg via ORAL
  Filled 2014-04-13 (×8): qty 1

## 2014-04-13 MED ORDER — DICYCLOMINE HCL 10 MG PO CAPS
10.0000 mg | ORAL_CAPSULE | Freq: Three times a day (TID) | ORAL | Status: DC
  Administered 2014-04-13 – 2014-04-21 (×24): 10 mg via ORAL
  Filled 2014-04-13 (×24): qty 1

## 2014-04-13 MED ORDER — ATORVASTATIN CALCIUM 10 MG PO TABS
20.0000 mg | ORAL_TABLET | Freq: Every day | ORAL | Status: DC
  Administered 2014-04-13 – 2014-04-20 (×8): 20 mg via ORAL
  Filled 2014-04-13 (×8): qty 2

## 2014-04-13 MED ORDER — INSULIN DETEMIR 100 UNIT/ML ~~LOC~~ SOLN
10.0000 [IU] | Freq: Two times a day (BID) | SUBCUTANEOUS | Status: DC
  Administered 2014-04-13 – 2014-04-14 (×4): 10 [IU] via SUBCUTANEOUS
  Filled 2014-04-13 (×6): qty 0.1

## 2014-04-13 NOTE — Progress Notes (Signed)
Pt has only put out around 30-40 cc's of stool in her ostomy today.  Mainly just having gas.  MD made aware.  Will continue to monitor closely.

## 2014-04-13 NOTE — Progress Notes (Signed)
Thanks for the message.  I didn't feel she had ovarian cancer either.  Do you want me to repeat the ultrasound or will your office schedule it.  Either is ok.  She was not somnolent when she was in my office so that is a change.  Thank you.  Vinnie Level

## 2014-04-13 NOTE — Progress Notes (Signed)
[facility] PROGRESS NOTE  Assessment/Plan: Hepatic encephalopathy - Started on lactulose with improvement in his confusion. - GI was consulted they added rifaximin. - Still with active asterixis is only had 2 bowel movements. Confusion seems to be improved.  Cirrhosis of liver not due to alcohol: - Most likely due to [place], CT scan of the abdomen and pelvis showed stable liver cirrhosis. - Continue low-sodium diet. Restrict fluids.  Essential hypertension - No changes made to her regimen.  Type 2 diabetes, uncontrolled, with neuropathy - A1c of 9.6 blood glucose uncontrolled here in the hospital. - Start her on long-acting insulin will probably need for home.    Code Status: full Family Communication: none  Disposition Plan: inpatient   Consultants:  GI  Procedures:  CT head  Antibiotics:  rifaximin  HPI/Subjective: She relates she feels slightly better she does not remember why she is here in the hospital.  Objective: Filed Vitals:   04/12/14 2017 04/12/14 2034 04/13/14 0431 04/13/14 0835  BP: 131/35  133/47   Pulse: 64  74   Temp: 98.3 F (36.8 C)  98.8 F (37.1 C)   TempSrc: Oral  Oral   Resp: 14  19   Height:      Weight:      SpO2:  98% 98% 100%    Intake/Output Summary (Last 24 hours) at 04/13/14 1044 Last data filed at 04/13/14 0755  Gross per 24 hour  Intake 3469.99 ml  Output   4300 ml  Net -830.01 ml   Filed Weights   04/12/14 0239  Weight: 94.847 kg (209 lb 1.6 oz)    Exam:  General: Alert, awake, oriented x3, in no acute distress.  HEENT: No bruits, no goiter.  Heart: Regular rate and rhythm. Lungs: Good air movement, clear Abdomen: Soft, nontender, nondistended, positive bowel sounds.  Neuro: Grossly intact, nonfocal. Positive asterixis   Data Reviewed: Basic Metabolic Panel:  Recent Labs Lab 04/11/14 2246 04/12/14 0710 04/13/14 0520  NA 134* 138 135  K 4.2 4.1 4.8  CL 104 105 100  CO2 24 28 24   GLUCOSE  219* 69* 196*  BUN 32* 30* 23  CREATININE 1.38* 1.26* 1.33*  CALCIUM 10.8* 10.5 10.2   Liver Function Tests:  Recent Labs Lab 04/11/14 2246 04/12/14 0710 04/13/14 0520  AST 54* 52* 50*  ALT 33 31 31  ALKPHOS 121* 107 109  BILITOT 0.7 0.5 0.7  PROT 7.6 6.9 6.9  ALBUMIN 3.8 3.4* 3.4*    Recent Labs Lab 04/11/14 2246  LIPASE 42    Recent Labs Lab 04/11/14 1700 04/11/14 2246 04/12/14 0710 04/12/14 1828 04/13/14 0645  AMMONIA 97* 101* 59* 56* 77*   CBC:  Recent Labs Lab 04/11/14 2246 04/12/14 0710 04/13/14 0520  WBC 12.8* 12.5* 11.5*  NEUTROABS 6.5 5.0 4.5  HGB 9.2* 8.8* 8.5*  HCT 29.3* 27.3* 26.8*  MCV 91.3 91.3 91.8  PLT PLATELET CLUMPS NOTED ON SMEAR, COUNT APPEARS ADEQUATE 374 360   Cardiac Enzymes: No results for input(s): CKTOTAL, CKMB, CKMBINDEX, TROPONINI in the last 168 hours. BNP (last 3 results)  Recent Labs  03/04/14 2100 04/12/14 0710  BNP 35.8 311.9*    ProBNP (last 3 results)  Recent Labs  07/16/13 0650 09/02/13 1803 03/30/14 1230  PROBNP 933.6* 558.0* 71.0    CBG:  Recent Labs Lab 04/12/14 1126 04/12/14 1612 04/12/14 2015 04/12/14 2353 04/13/14 0347  GLUCAP 105* 201* 296* 225* 196*    Recent Results (from the past 240 hour(s))  MRSA PCR Screening     Status: None   Collection Time: 04/12/14  3:44 AM  Result Value Ref Range Status   MRSA by PCR NEGATIVE NEGATIVE Final    Comment:        The GeneXpert MRSA Assay (FDA approved for NASAL specimens only), is one component of a comprehensive MRSA colonization surveillance program. It is not intended to diagnose MRSA infection nor to guide or monitor treatment for MRSA infections.      Studies: Ct Abdomen Pelvis Wo Contrast  04/12/2014   CLINICAL DATA:  Generalized abdominal pain.  EXAM: CT ABDOMEN AND PELVIS WITHOUT CONTRAST  TECHNIQUE: Multidetector CT imaging of the abdomen and pelvis was performed following the standard protocol without IV contrast.   COMPARISON:  Radiographs obtained yesterday.  CT dated 03/04/2014.  FINDINGS: Cholecystectomy clips. Left lower quadrant colostomy. Surgical absence of a portion of the sigmoid colon. Small bowel to rectal anastomosis. No bowel dilatation. No enlarged lymph nodes.  A previously demonstrated 3.0 cm left inferior pelvic cyst associated with the left ovary currently measures 4.5 cm in maximum corresponding diameter. A previously demonstrated 3.3 cm right pelvic cyst associated with an the right ovary or ovarian remnant currently measures 4.9 cm in maximum diameter and has a moderately thickened internal septation or represents 2 adjacent cysts. The patient reportedly has had a right oophorectomy.  Surgically absent uterus. Atheromatous arterial calcifications. Surgically absent spleen. The caudate lobe and lateral segment of the left lobe of the liver are enlarged in the liver contours are mildly irregular. Surgically absent spleen. Unremarkable non contrasted appearance of the pancreas, adrenal glands and kidneys. No significant change in a 2 mm nodule in the right lower lobe on image 4. This has not changed significantly since 11/06/2012 and was not seen prior to that time. Mild lower thoracic spine degenerative changes.  IMPRESSION: 1. No acute abnormality. 2. Stable changes of cirrhosis of the liver. 3. Further enlargement in probable bilateral ovarian cysts despite a reported history of right oophorectomy. Hydrosalpinx is also a possibility. Further evaluation with elective pelvic ultrasound is recommended. 4. Stable 2 mm right lower lobe nodule.   Electronically Signed   By: Claudie Revering M.D.   On: 04/12/2014 12:58   Ct Head Wo Contrast  04/12/2014   CLINICAL DATA:  Progressive weakness and fatigue for 6 months. Elevated ammonia levels. Initial encounter.  EXAM: CT HEAD WITHOUT CONTRAST  TECHNIQUE: Contiguous axial images were obtained from the base of the skull through the vertex without intravenous contrast.   COMPARISON:  CT of the head performed 04/21/2013  FINDINGS: There is no evidence of acute infarction, mass lesion, or intra- or extra-axial hemorrhage on CT.  Prominence of the sulci suggests mild cortical volume loss. Mild cerebellar atrophy is noted.  The brainstem and fourth ventricle are within normal limits. The basal ganglia are unremarkable in appearance. The cerebral hemispheres demonstrate grossly normal gray-white differentiation. No mass effect or midline shift is seen.  There is no evidence of fracture; visualized osseous structures are unremarkable in appearance. The orbits are within normal limits. A mucus retention cyst or polyp is noted at the right maxillary sinus. The remaining paranasal sinuses and mastoid air cells are well-aerated. No significant soft tissue abnormalities are seen.  IMPRESSION: 1. No acute intracranial pathology seen on CT. 2. Mild cortical volume loss noted. 3. Mucus retention cyst or polyp at the right maxillary sinus.   Electronically Signed   By: Garald Balding M.D.   On:  04/12/2014 00:17   Dg Abd Acute W/chest  04/11/2014   CLINICAL DATA:  Upper abdominal pain for 2 months, worse tonight.  EXAM: ACUTE ABDOMEN SERIES (ABDOMEN 2 VIEW & CHEST 1 VIEW)  COMPARISON:  03/04/2014  FINDINGS: The upright view the chest demonstrates moderate unchanged cardiomegaly. Moderate vascular congestion is present, without alveolar edema or infiltrate. Supine and left lateral decubitus views of the abdomen are negative for obstruction or perforation.  IMPRESSION: Negative abdominal radiographs. Moderate vascular congestion. Unchanged cardiomegaly.   Electronically Signed   By: Andreas Newport M.D.   On: 04/11/2014 23:57    Scheduled Meds: . atorvastatin  20 mg Oral q morning - 10a  . ciprofloxacin  400 mg Intravenous Q12H  . dicyclomine  10 mg Oral TID  . folic acid  1 mg Oral QPM  . furosemide  80 mg Oral BID  . hydrALAZINE  37.5 mg Oral BID  . insulin aspart  0-9 Units  Subcutaneous 6 times per day  . insulin detemir  10 Units Subcutaneous BID  . isosorbide dinitrate  20 mg Oral BID  . lactulose  30 g Oral TID  . losartan  50 mg Oral Daily  . metoCLOPramide  10 mg Oral TID  . metolazone  2.5 mg Oral Daily  . mometasone-formoterol  2 puff Inhalation BID  . pantoprazole  80 mg Oral Q1200  . potassium chloride  10 mEq Oral Daily  . rifaximin  550 mg Oral BID  . sodium chloride  3 mL Intravenous Q12H   Continuous Infusions: . sodium chloride 50 mL/hr at 04/12/14 [place], ABRAHAM  [facility] Pager [phone]. If 7PM-7AM, please contact night-coverage at [email or web address], password [facility] 04/13/2014, 10:44 AM  LOS: 1 day

## 2014-04-14 DIAGNOSIS — R1032 Left lower quadrant pain: Secondary | ICD-10-CM

## 2014-04-14 LAB — CBC WITH DIFFERENTIAL/PLATELET
Basophils Absolute: 0.1 10*3/uL (ref 0.0–0.1)
Basophils Relative: 1 % (ref 0–1)
EOS ABS: 1 10*3/uL — AB (ref 0.0–0.7)
Eosinophils Relative: 7 % — ABNORMAL HIGH (ref 0–5)
HEMATOCRIT: 28.7 % — AB (ref 36.0–46.0)
Hemoglobin: 8.9 g/dL — ABNORMAL LOW (ref 12.0–15.0)
LYMPHS ABS: 2.9 10*3/uL (ref 0.7–4.0)
Lymphocytes Relative: 21 % (ref 12–46)
MCH: 28.5 pg (ref 26.0–34.0)
MCHC: 31 g/dL (ref 30.0–36.0)
MCV: 92 fL (ref 78.0–100.0)
Monocytes Absolute: 2.8 10*3/uL — ABNORMAL HIGH (ref 0.1–1.0)
Monocytes Relative: 20 % — ABNORMAL HIGH (ref 3–12)
Neutro Abs: 7.2 10*3/uL (ref 1.7–7.7)
Neutrophils Relative %: 51 % (ref 43–77)
PLATELETS: 373 10*3/uL (ref 150–400)
RBC: 3.12 MIL/uL — ABNORMAL LOW (ref 3.87–5.11)
RDW: 14.4 % (ref 11.5–15.5)
WBC: 14 10*3/uL — ABNORMAL HIGH (ref 4.0–10.5)

## 2014-04-14 LAB — COMPREHENSIVE METABOLIC PANEL
ALBUMIN: 3.6 g/dL (ref 3.5–5.2)
ALT: 29 U/L (ref 0–35)
AST: 40 U/L — AB (ref 0–37)
Alkaline Phosphatase: 114 U/L (ref 39–117)
Anion gap: 12 (ref 5–15)
BUN: 24 mg/dL — ABNORMAL HIGH (ref 6–23)
CHLORIDE: 94 mmol/L — AB (ref 96–112)
CO2: 26 mmol/L (ref 19–32)
Calcium: 10.5 mg/dL (ref 8.4–10.5)
Creatinine, Ser: 1.36 mg/dL — ABNORMAL HIGH (ref 0.50–1.10)
GFR calc Af Amer: 47 mL/min — ABNORMAL LOW (ref >90)
GFR calc non Af Amer: 41 mL/min — ABNORMAL LOW (ref >90)
Glucose, Bld: 251 mg/dL — ABNORMAL HIGH (ref 70–99)
POTASSIUM: 4.5 mmol/L (ref 3.5–5.1)
SODIUM: 132 mmol/L — AB (ref 135–145)
TOTAL PROTEIN: 7.4 g/dL (ref 6.0–8.3)
Total Bilirubin: 0.6 mg/dL (ref 0.3–1.2)

## 2014-04-14 LAB — GLUCOSE, CAPILLARY
GLUCOSE-CAPILLARY: 271 mg/dL — AB (ref 70–99)
GLUCOSE-CAPILLARY: 272 mg/dL — AB (ref 70–99)
GLUCOSE-CAPILLARY: 273 mg/dL — AB (ref 70–99)
GLUCOSE-CAPILLARY: 305 mg/dL — AB (ref 70–99)
Glucose-Capillary: 217 mg/dL — ABNORMAL HIGH (ref 70–99)
Glucose-Capillary: 313 mg/dL — ABNORMAL HIGH (ref 70–99)

## 2014-04-14 LAB — CLOSTRIDIUM DIFFICILE BY PCR: CDIFFPCR: NEGATIVE

## 2014-04-14 LAB — AMMONIA: Ammonia: 40 umol/L — ABNORMAL HIGH (ref 11–32)

## 2014-04-14 MED ORDER — METRONIDAZOLE 500 MG PO TABS
500.0000 mg | ORAL_TABLET | Freq: Three times a day (TID) | ORAL | Status: DC
  Administered 2014-04-14 – 2014-04-15 (×3): 500 mg via ORAL
  Filled 2014-04-14 (×3): qty 1

## 2014-04-14 MED ORDER — SODIUM CHLORIDE 0.9 % IV BOLUS (SEPSIS)
1000.0000 mL | Freq: Once | INTRAVENOUS | Status: AC
  Administered 2014-04-14: 1000 mL via INTRAVENOUS

## 2014-04-14 MED ORDER — SODIUM CHLORIDE 0.9 % IV SOLN
INTRAVENOUS | Status: AC
  Administered 2014-04-14 – 2014-04-15 (×2): via INTRAVENOUS

## 2014-04-14 NOTE — Progress Notes (Signed)
Inpatient Diabetes Program Recommendations  AACE/ADA: New Consensus Statement on Inpatient Glycemic Control (2013)  Target Ranges:  Prepandial:   less than 140 mg/dL      Peak postprandial:   less than 180 mg/dL (1-2 hours)      Critically ill patients:  140 - 180 mg/dL   Reason for Visit: Hyperglycemia  Diabetes history: DM2  Per Dr. Arman Filter (pt's endo) office note on 03/01/14: Pt was on a regimen of: - Lantus 40-30 units - pen  - Humalog 45-35-40 >> 55-45-45 units with B-L-D- pens  Copay $0 for her insulins.  She is now on U500 insulin: - 30-30-30 with B-L-D -- was increased to 33-33-30 on 03/01/14. Also on Metformin 500 mg 2x a day.   Inpatient Diabetes Program Recommendations Insulin - Basal: Increase Levemir to 30 units bid Insulin - Meal Coverage: Novolog 10 units tidwc for meal coverage insulin Diet: Add CHO mod med to soft diet  Note: Will continue to follow. Thank you. Lorenda Peck, RD, LDN, CDE Inpatient Diabetes Coordinator [phone]

## 2014-04-14 NOTE — Progress Notes (Signed)
[facility] PROGRESS NOTE  Assessment/Plan: Hepatic encephalopathy - Started on lactulose, her encephalopathy has resolved. - GI was consulted they added rifaximin. - Large amounts of stools.  Leukocytosis: - She was started empirically on ciprofloxacin on admission, she has had large amounts of output through her stoma. She has a mild leukocytosis with abdominal tenderness. Her CT scan shows enlarging ovarian cyst. - D/c Ciprofloxacin, check a C. difficile started on Flagyl empirically.  Cirrhosis of liver not due to alcohol: - Most likely due to [place], CT scan of the abdomen and pelvis showed stable liver cirrhosis. - Continue low-sodium diet. Restrict fluids.  Essential hypertension - No changes made to her regimen.  Type 2 diabetes, uncontrolled, with neuropathy - A1c of 9.6 blood glucose uncontrolled here in the hospital. - Start her on long-acting insulin will probably need for home.   Hyponatremia: - Etiology prerenal most likely she was on Lasix and metolazone, she is negative about 5 L. With large amounts of stool output through her stoma.- Start 1L of bolus of normal saline and recheck a basic metabolic panel in the morning. Code Status: full Family Communication: none  Disposition Plan: inpatient   Consultants:  GI  Procedures:  CT head  Antibiotics:  rifaximin  HPI/Subjective: Complaining of abdominal pain. He tolerated diet.  Objective: Filed Vitals:   04/13/14 0835 04/13/14 1330 04/13/14 2016 04/13/14 2152  BP:  136/40  134/45  Pulse:  64  69  Temp:  97.6 F (36.4 C)  97.3 F (36.3 C)  TempSrc:  Oral  Oral  Resp:  18  16  Height:      Weight:      SpO2: 100% 97% 98% 98%    Intake/Output Summary (Last 24 hours) at 04/14/14 0946 Last data filed at 04/14/14 0820  Gross per 24 hour  Intake   1720 ml  Output   7650 ml  Net  -5930 ml   Filed Weights   04/12/14 0239  Weight: 94.847 kg (209 lb 1.6 oz)    Exam:  General: Alert,  awake, oriented x3, in no acute distress.  HEENT: No bruits, no goiter.  Heart: Regular rate and rhythm. Lungs: Good air movement, clear Abdomen: Soft, mild left lower quadrant tenderness., nondistended, positive bowel sounds.  Neuro: Grossly intact, nonfocal. Positive asterixis   Data Reviewed: Basic Metabolic Panel:  Recent Labs Lab 04/11/14 2246 04/12/14 0710 04/13/14 0520 04/14/14 0600  NA 134* 138 135 132*  K 4.2 4.1 4.8 4.5  CL 104 105 100 94*  CO2 24 28 24 26   GLUCOSE 219* 69* 196* 251*  BUN 32* 30* 23 24*  CREATININE 1.38* 1.26* 1.33* 1.36*  CALCIUM 10.8* 10.5 10.2 10.5   Liver Function Tests:  Recent Labs Lab 04/11/14 2246 04/12/14 0710 04/13/14 0520 04/14/14 0600  AST 54* 52* 50* 40*  ALT 33 31 31 29   ALKPHOS 121* 107 109 114  BILITOT 0.7 0.5 0.7 0.6  PROT 7.6 6.9 6.9 7.4  ALBUMIN 3.8 3.4* 3.4* 3.6    Recent Labs Lab 04/11/14 2246  LIPASE 42    Recent Labs Lab 04/12/14 0710 04/12/14 1828 04/13/14 0645 04/13/14 1815 04/14/14 0600  AMMONIA 59* 56* 77* 45* 40*   CBC:  Recent Labs Lab 04/11/14 2246 04/12/14 0710 04/13/14 0520 04/14/14 0600  WBC 12.8* 12.5* 11.5* 14.0*  NEUTROABS 6.5 5.0 4.5 7.2  HGB 9.2* 8.8* 8.5* 8.9*  HCT 29.3* 27.3* 26.8* 28.7*  MCV 91.3 91.3 91.8 92.0  PLT PLATELET CLUMPS  NOTED ON SMEAR, COUNT APPEARS ADEQUATE 374 360 373   Cardiac Enzymes: No results for input(s): CKTOTAL, CKMB, CKMBINDEX, TROPONINI in the last 168 hours. BNP (last 3 results)  Recent Labs  03/04/14 2100 04/12/14 0710  BNP 35.8 311.9*    ProBNP (last 3 results)  Recent Labs  07/16/13 0650 09/02/13 1803 03/30/14 1230  PROBNP 933.6* 558.0* 71.0    CBG:  Recent Labs Lab 04/13/14 1634 04/13/14 1959 04/13/14 2357 04/14/14 0407 04/14/14 0725  GLUCAP 248* 305* 305* 271* 217*    Recent Results (from the past 240 hour(s))  Urine culture     Status: None   Collection Time: 04/12/14  2:02 AM  Result Value Ref Range Status    Specimen Description URINE, RANDOM  Final   Special Requests NONE  Final   Colony Count   Final    25,000 COLONIES/ML Performed at [facility]    Culture   Final    Multiple bacterial morphotypes present, none predominant. Suggest appropriate recollection if clinically indicated. Performed at [facility]    Report Status 04/13/2014 FINAL  Final  MRSA PCR Screening     Status: None   Collection Time: 04/12/14  3:44 AM  Result Value Ref Range Status   MRSA by PCR NEGATIVE NEGATIVE Final    Comment:        The GeneXpert MRSA Assay (FDA approved for NASAL specimens only), is one component of a comprehensive MRSA colonization surveillance program. It is not intended to diagnose MRSA infection nor to guide or monitor treatment for MRSA infections.      Studies: Ct Abdomen Pelvis Wo Contrast  04/12/2014   CLINICAL DATA:  Generalized abdominal pain.  EXAM: CT ABDOMEN AND PELVIS WITHOUT CONTRAST  TECHNIQUE: Multidetector CT imaging of the abdomen and pelvis was performed following the standard protocol without IV contrast.  COMPARISON:  Radiographs obtained yesterday.  CT dated 03/04/2014.  FINDINGS: Cholecystectomy clips. Left lower quadrant colostomy. Surgical absence of a portion of the sigmoid colon. Small bowel to rectal anastomosis. No bowel dilatation. No enlarged lymph nodes.  A previously demonstrated 3.0 cm left inferior pelvic cyst associated with the left ovary currently measures 4.5 cm in maximum corresponding diameter. A previously demonstrated 3.3 cm right pelvic cyst associated with an the right ovary or ovarian remnant currently measures 4.9 cm in maximum diameter and has a moderately thickened internal septation or represents 2 adjacent cysts. The patient reportedly has had a right oophorectomy.  Surgically absent uterus. Atheromatous arterial calcifications. Surgically absent spleen. The caudate lobe and lateral segment of the left lobe of the liver are  enlarged in the liver contours are mildly irregular. Surgically absent spleen. Unremarkable non contrasted appearance of the pancreas, adrenal glands and kidneys. No significant change in a 2 mm nodule in the right lower lobe on image 4. This has not changed significantly since 11/06/2012 and was not seen prior to that time. Mild lower thoracic spine degenerative changes.  IMPRESSION: 1. No acute abnormality. 2. Stable changes of cirrhosis of the liver. 3. Further enlargement in probable bilateral ovarian cysts despite a reported history of right oophorectomy. Hydrosalpinx is also a possibility. Further evaluation with elective pelvic ultrasound is recommended. 4. Stable 2 mm right lower lobe nodule.   Electronically Signed   By: Claudie Revering M.D.   On: 04/12/2014 12:58    Scheduled Meds: . atorvastatin  20 mg Oral q1800  . dicyclomine  10 mg Oral TID  . folic acid  1 mg Oral QPM  . hydrALAZINE  37.5 mg Oral BID  . insulin aspart  0-9 Units Subcutaneous 6 times per day  . insulin detemir  10 Units Subcutaneous BID  . isosorbide dinitrate  20 mg Oral BID  . lactulose  30 g Oral TID  . losartan  50 mg Oral Daily  . metoCLOPramide  10 mg Oral TID  . mometasone-formoterol  2 puff Inhalation BID  . pantoprazole  80 mg Oral Q1200  . potassium chloride  10 mEq Oral Daily  . rifaximin  550 mg Oral BID  . sodium chloride  1,000 mL Intravenous Once  . sodium chloride  3 mL Intravenous Q12H   Continuous Infusions: . sodium chloride 50 mL/hr at 04/13/14 [place], ABRAHAM  [facility] Pager [phone]. If 7PM-7AM, please contact night-coverage at [email or web address], password [facility] 04/14/2014, 9:46 AM  LOS: 2 days

## 2014-04-15 ENCOUNTER — Encounter (HOSPITAL_COMMUNITY): Payer: Self-pay | Admitting: Radiology

## 2014-04-15 ENCOUNTER — Ambulatory Visit (HOSPITAL_COMMUNITY): Payer: Medicare Other

## 2014-04-15 ENCOUNTER — Inpatient Hospital Stay (HOSPITAL_COMMUNITY): Payer: Medicare Other

## 2014-04-15 LAB — CBC WITH DIFFERENTIAL/PLATELET
Basophils Absolute: 0.1 10*3/uL (ref 0.0–0.1)
Basophils Relative: 1 % (ref 0–1)
EOS ABS: 1 10*3/uL — AB (ref 0.0–0.7)
Eosinophils Relative: 6 % — ABNORMAL HIGH (ref 0–5)
HCT: 29.3 % — ABNORMAL LOW (ref 36.0–46.0)
Hemoglobin: 9.1 g/dL — ABNORMAL LOW (ref 12.0–15.0)
LYMPHS ABS: 3.3 10*3/uL (ref 0.7–4.0)
Lymphocytes Relative: 19 % (ref 12–46)
MCH: 28.4 pg (ref 26.0–34.0)
MCHC: 31.1 g/dL (ref 30.0–36.0)
MCV: 91.6 fL (ref 78.0–100.0)
Monocytes Absolute: 3.3 10*3/uL — ABNORMAL HIGH (ref 0.1–1.0)
Monocytes Relative: 19 % — ABNORMAL HIGH (ref 3–12)
Neutro Abs: 9.6 10*3/uL — ABNORMAL HIGH (ref 1.7–7.7)
Neutrophils Relative %: 55 % (ref 43–77)
Platelets: 415 10*3/uL — ABNORMAL HIGH (ref 150–400)
RBC: 3.2 MIL/uL — AB (ref 3.87–5.11)
RDW: 14.4 % (ref 11.5–15.5)
WBC: 17.3 10*3/uL — ABNORMAL HIGH (ref 4.0–10.5)

## 2014-04-15 LAB — COMPREHENSIVE METABOLIC PANEL
ALBUMIN: 3.6 g/dL (ref 3.5–5.2)
ALT: 26 U/L (ref 0–35)
AST: 33 U/L (ref 0–37)
Alkaline Phosphatase: 114 U/L (ref 39–117)
Anion gap: 11 (ref 5–15)
BUN: 25 mg/dL — ABNORMAL HIGH (ref 6–23)
CALCIUM: 9.9 mg/dL (ref 8.4–10.5)
CO2: 26 mmol/L (ref 19–32)
Chloride: 91 mmol/L — ABNORMAL LOW (ref 96–112)
Creatinine, Ser: 1.45 mg/dL — ABNORMAL HIGH (ref 0.50–1.10)
GFR calc Af Amer: 44 mL/min — ABNORMAL LOW (ref >90)
GFR calc non Af Amer: 38 mL/min — ABNORMAL LOW (ref >90)
Glucose, Bld: 268 mg/dL — ABNORMAL HIGH (ref 70–99)
Potassium: 4.2 mmol/L (ref 3.5–5.1)
Sodium: 128 mmol/L — ABNORMAL LOW (ref 135–145)
TOTAL PROTEIN: 7.3 g/dL (ref 6.0–8.3)
Total Bilirubin: 0.5 mg/dL (ref 0.3–1.2)

## 2014-04-15 LAB — GLUCOSE, CAPILLARY
GLUCOSE-CAPILLARY: 302 mg/dL — AB (ref 70–99)
GLUCOSE-CAPILLARY: 218 mg/dL — AB (ref 70–99)
GLUCOSE-CAPILLARY: 210 mg/dL — AB (ref 70–99)
GLUCOSE-CAPILLARY: 183 mg/dL — AB (ref 70–99)
Glucose-Capillary: 286 mg/dL — ABNORMAL HIGH (ref 70–99)
Glucose-Capillary: 242 mg/dL — ABNORMAL HIGH (ref 70–99)

## 2014-04-15 MED ORDER — METRONIDAZOLE IN NACL 5-0.79 MG/ML-% IV SOLN
500.0000 mg | Freq: Three times a day (TID) | INTRAVENOUS | Status: DC
  Administered 2014-04-15 – 2014-04-19 (×11): 500 mg via INTRAVENOUS
  Filled 2014-04-15 (×11): qty 100

## 2014-04-15 MED ORDER — SODIUM CHLORIDE 0.9 % IV BOLUS (SEPSIS)
1000.0000 mL | Freq: Once | INTRAVENOUS | Status: AC
  Administered 2014-04-15: 1000 mL via INTRAVENOUS

## 2014-04-15 MED ORDER — IOHEXOL 300 MG/ML  SOLN
25.0000 mL | INTRAMUSCULAR | Status: AC
  Administered 2014-04-15 (×2): 25 mL via ORAL

## 2014-04-15 MED ORDER — LACTULOSE 10 GM/15ML PO SOLN
10.0000 g | Freq: Every day | ORAL | Status: DC
  Administered 2014-04-15 – 2014-04-18 (×4): 10 g via ORAL
  Filled 2014-04-15 (×5): qty 30

## 2014-04-15 MED ORDER — SODIUM CHLORIDE 0.9 % IV SOLN
INTRAVENOUS | Status: AC
  Administered 2014-04-16: 01:00:00 via INTRAVENOUS

## 2014-04-15 MED ORDER — INSULIN DETEMIR 100 UNIT/ML ~~LOC~~ SOLN
15.0000 [IU] | Freq: Two times a day (BID) | SUBCUTANEOUS | Status: DC
  Administered 2014-04-15 – 2014-04-16 (×4): 15 [IU] via SUBCUTANEOUS
  Filled 2014-04-15 (×5): qty 0.15

## 2014-04-15 MED ORDER — CIPROFLOXACIN IN D5W 400 MG/200ML IV SOLN
400.0000 mg | Freq: Two times a day (BID) | INTRAVENOUS | Status: DC
  Administered 2014-04-15 – 2014-04-18 (×8): 400 mg via INTRAVENOUS
  Filled 2014-04-15 (×8): qty 200

## 2014-04-15 NOTE — Progress Notes (Signed)
Results for [name], [name] (MRN [id number]) as of 04/15/2014 10:39  Ref. Range 04/14/2014 16:16 04/14/2014 20:07 04/15/2014 00:10 04/15/2014 04:25 04/15/2014 07:32  Glucose-Capillary Latest Range: 70-99 mg/dL 313 (H) 273 (H) 286 (H) 302 (H) 218 (H)   On U-500 insulin at home. Endo is Dr. Cruzita Lederer. Will need more basal for better glycemic control. Home meds: U-500 33-33-30 tid. (5x stronger)  Consider increasing Levemir to 30 units bid Add Novolog 10 units tidwc Change Novolog correction to moderate tidwc and hs instead of Q4H since pt is eating.  Thank you. Lorenda Peck, RD, LDN, CDE Inpatient Diabetes Coordinator [phone]

## 2014-04-15 NOTE — Progress Notes (Signed)
[facility] PROGRESS NOTE  Assessment/Plan: Hepatic encephalopathy: - Started on lactulose, her encephalopathy has resolved. - GI was consulted they added rifaximin. - Large amounts of stools.  Leukocytosis: - Leukocytosis continues to worsens, start IV  Flagyl and Cipro empirically for possible colitis, a CT abdomen and pelvis and pelvis ultrasound. She has mild left lower quadrant tenderness. - C. difficile negative.  Cirrhosis of liver not due to alcohol: - Most likely due to [place], CT scan of the abdomen and pelvis showed stable liver cirrhosis. - Continue low-sodium diet. Restrict fluids.  Essential hypertension: - No changes made to her regimen.  Type 2 diabetes, uncontrolled, with neuropathy - A1c of 9.6 blood glucose uncontrolled here in the hospital. - Increase long-acting insulin.   Hyponatremia: - Etiology prerenal most likely she was on Lasix and metolazone, she is negative about 3 L. With large amounts of stool output through her stoma. - Continue IV hydration aggressively, check a basic metabolic panel in the morning.  Code Status: full Family Communication: none  Disposition Plan: inpatient   Consultants:  GI  Procedures:  CT head  Antibiotics:  rifaximin  HPI/Subjective: Complaining of abdominal pain. She tolerated diet.  Objective: Filed Vitals:   04/14/14 1025 04/14/14 1451 04/14/14 2154 04/15/14 0418  BP: 139/40 126/45 124/39 126/46  Pulse:  73 69 60  Temp:  98.1 F (36.7 C) 99.2 F (37.3 C) 97.9 F (36.6 C)  TempSrc:  Oral Oral Oral  Resp:  16 16 16   Height:      Weight:    95.391 kg (210 lb 4.8 oz)  SpO2:  99% 99% 99%    Intake/Output Summary (Last 24 hours) at 04/15/14 0850 Last data filed at 04/15/14 0843  Gross per 24 hour  Intake 1616.67 ml  Output   5425 ml  Net -3808.33 ml   Filed Weights   04/12/14 0239 04/15/14 0418  Weight: 94.847 kg (209 lb 1.6 oz) 95.391 kg (210 lb 4.8 oz)    Exam:  General: Alert,  awake, oriented x3, in no acute distress.  HEENT: No bruits, no goiter.  Heart: Regular rate and rhythm. Lungs: Good air movement, clear Abdomen: Soft, mild left lower quadrant tenderness., nondistended, positive bowel sounds.  Neuro: Grossly intact, nonfocal. Positive asterixis   Data Reviewed: Basic Metabolic Panel:  Recent Labs Lab 04/11/14 2246 04/12/14 0710 04/13/14 0520 04/14/14 0600 04/15/14 0528  NA 134* 138 135 132* 128*  K 4.2 4.1 4.8 4.5 4.2  CL 104 105 100 94* 91*  CO2 24 28 24 26 26   GLUCOSE 219* 69* 196* 251* 268*  BUN 32* 30* 23 24* 25*  CREATININE 1.38* 1.26* 1.33* 1.36* 1.45*  CALCIUM 10.8* 10.5 10.2 10.5 9.9   Liver Function Tests:  Recent Labs Lab 04/11/14 2246 04/12/14 0710 04/13/14 0520 04/14/14 0600 04/15/14 0528  AST 54* 52* 50* 40* 33  ALT 33 31 31 29 26   ALKPHOS 121* 107 109 114 114  BILITOT 0.7 0.5 0.7 0.6 0.5  PROT 7.6 6.9 6.9 7.4 7.3  ALBUMIN 3.8 3.4* 3.4* 3.6 3.6    Recent Labs Lab 04/11/14 2246  LIPASE 42    Recent Labs Lab 04/12/14 0710 04/12/14 1828 04/13/14 0645 04/13/14 1815 04/14/14 0600  AMMONIA 59* 56* 77* 45* 40*   CBC:  Recent Labs Lab 04/11/14 2246 04/12/14 0710 04/13/14 0520 04/14/14 0600 04/15/14 0528  WBC 12.8* 12.5* 11.5* 14.0* 17.3*  NEUTROABS 6.5 5.0 4.5 7.2 9.6*  HGB 9.2* 8.8* 8.5* 8.9* 9.1*  HCT 29.3* 27.3* 26.8* 28.7* 29.3*  MCV 91.3 91.3 91.8 92.0 91.6  PLT PLATELET CLUMPS NOTED ON SMEAR, COUNT APPEARS ADEQUATE 374 360 373 415*   Cardiac Enzymes: No results for input(s): CKTOTAL, CKMB, CKMBINDEX, TROPONINI in the last 168 hours. BNP (last 3 results)  Recent Labs  03/04/14 2100 04/12/14 0710  BNP 35.8 311.9*    ProBNP (last 3 results)  Recent Labs  07/16/13 0650 09/02/13 1803 03/30/14 1230  PROBNP 933.6* 558.0* 71.0    CBG:  Recent Labs Lab 04/14/14 1616 04/14/14 2007 04/15/14 0010 04/15/14 0425 04/15/14 0732  GLUCAP 313* 273* 286* 302* 218*    Recent Results  (from the past 240 hour(s))  Urine culture     Status: None   Collection Time: 04/12/14  2:02 AM  Result Value Ref Range Status   Specimen Description URINE, RANDOM  Final   Special Requests NONE  Final   Colony Count   Final    25,000 COLONIES/ML Performed at [facility]    Culture   Final    Multiple bacterial morphotypes present, none predominant. Suggest appropriate recollection if clinically indicated. Performed at [facility]    Report Status 04/13/2014 FINAL  Final  MRSA PCR Screening     Status: None   Collection Time: 04/12/14  3:44 AM  Result Value Ref Range Status   MRSA by PCR NEGATIVE NEGATIVE Final    Comment:        The GeneXpert MRSA Assay (FDA approved for NASAL specimens only), is one component of a comprehensive MRSA colonization surveillance program. It is not intended to diagnose MRSA infection nor to guide or monitor treatment for MRSA infections.   Clostridium Difficile by PCR     Status: None   Collection Time: 04/14/14 11:00 AM  Result Value Ref Range Status   C difficile by pcr NEGATIVE NEGATIVE Final     Studies: No results found.  Scheduled Meds: . atorvastatin  20 mg Oral q1800  . dicyclomine  10 mg Oral TID  . folic acid  1 mg Oral QPM  . hydrALAZINE  37.5 mg Oral BID  . insulin aspart  0-9 Units Subcutaneous 6 times per day  . insulin detemir  10 Units Subcutaneous BID  . isosorbide dinitrate  20 mg Oral BID  . lactulose  30 g Oral TID  . losartan  50 mg Oral Daily  . metoCLOPramide  10 mg Oral TID  . metroNIDAZOLE  500 mg Oral 3 times per day  . mometasone-formoterol  2 puff Inhalation BID  . pantoprazole  80 mg Oral Q1200  . potassium chloride  10 mEq Oral Daily  . rifaximin  550 mg Oral BID  . sodium chloride  1,000 mL Intravenous Once  . sodium chloride  3 mL Intravenous Q12H   Continuous Infusions: . sodium chloride 50 mL/hr at 04/15/14 0626  . sodium chloride       Charlynne Cousins  [facility] Pager [phone]. If 7PM-7AM, please contact night-coverage at [email or web address], password [facility] 04/15/2014, 8:50 AM  LOS: 3 days

## 2014-04-16 DIAGNOSIS — A09 Infectious gastroenteritis and colitis, unspecified: Secondary | ICD-10-CM

## 2014-04-16 LAB — BASIC METABOLIC PANEL
Anion gap: 10 (ref 5–15)
BUN: 22 mg/dL (ref 6–23)
CO2: 27 mmol/L (ref 19–32)
Calcium: 9.5 mg/dL (ref 8.4–10.5)
Chloride: 92 mmol/L — ABNORMAL LOW (ref 96–112)
Creatinine, Ser: 1.25 mg/dL — ABNORMAL HIGH (ref 0.50–1.10)
GFR calc Af Amer: 52 mL/min — ABNORMAL LOW (ref >90)
GFR calc non Af Amer: 45 mL/min — ABNORMAL LOW (ref >90)
GLUCOSE: 204 mg/dL — AB (ref 70–99)
Potassium: 4 mmol/L (ref 3.5–5.1)
SODIUM: 129 mmol/L — AB (ref 135–145)

## 2014-04-16 LAB — GLUCOSE, CAPILLARY
GLUCOSE-CAPILLARY: 207 mg/dL — AB (ref 70–99)
GLUCOSE-CAPILLARY: 223 mg/dL — AB (ref 70–99)
GLUCOSE-CAPILLARY: 254 mg/dL — AB (ref 70–99)
Glucose-Capillary: 219 mg/dL — ABNORMAL HIGH (ref 70–99)
Glucose-Capillary: 244 mg/dL — ABNORMAL HIGH (ref 70–99)
Glucose-Capillary: 289 mg/dL — ABNORMAL HIGH (ref 70–99)

## 2014-04-16 LAB — CBC WITH DIFFERENTIAL/PLATELET
BASOS ABS: 0.2 10*3/uL — AB (ref 0.0–0.1)
Basophils Relative: 1 % (ref 0–1)
Eosinophils Absolute: 1.4 10*3/uL — ABNORMAL HIGH (ref 0.0–0.7)
Eosinophils Relative: 8 % — ABNORMAL HIGH (ref 0–5)
HCT: 28.5 % — ABNORMAL LOW (ref 36.0–46.0)
Hemoglobin: 9.2 g/dL — ABNORMAL LOW (ref 12.0–15.0)
LYMPHS ABS: 3.1 10*3/uL (ref 0.7–4.0)
Lymphocytes Relative: 18 % (ref 12–46)
MCH: 29.3 pg (ref 26.0–34.0)
MCHC: 32.3 g/dL (ref 30.0–36.0)
MCV: 90.8 fL (ref 78.0–100.0)
MONO ABS: 3.6 10*3/uL — AB (ref 0.1–1.0)
MONOS PCT: 21 % — AB (ref 3–12)
NEUTROS PCT: 52 % (ref 43–77)
Neutro Abs: 8.9 10*3/uL — ABNORMAL HIGH (ref 1.7–7.7)
PLATELETS: 374 10*3/uL (ref 150–400)
RBC: 3.14 MIL/uL — ABNORMAL LOW (ref 3.87–5.11)
RDW: 14.3 % (ref 11.5–15.5)
WBC: 17.2 10*3/uL — ABNORMAL HIGH (ref 4.0–10.5)

## 2014-04-16 MED ORDER — SODIUM CHLORIDE 0.9 % IV BOLUS (SEPSIS): 500.0000 mL | Freq: Once | INTRAVENOUS | Status: DC

## 2014-04-16 MED ORDER — SODIUM CHLORIDE 0.9 % IV BOLUS (SEPSIS)
1000.0000 mL | Freq: Once | INTRAVENOUS | Status: AC
  Administered 2014-04-16: 1000 mL via INTRAVENOUS

## 2014-04-16 MED ORDER — SODIUM CHLORIDE 0.9 % IV BOLUS (SEPSIS): 1000.0000 mL | Freq: Once | INTRAVENOUS | Status: DC

## 2014-04-16 MED ORDER — INSULIN ASPART 100 UNIT/ML ~~LOC~~ SOLN
0.0000 [IU] | Freq: Three times a day (TID) | SUBCUTANEOUS | Status: DC
  Administered 2014-04-16 – 2014-04-17 (×2): 3 [IU] via SUBCUTANEOUS
  Administered 2014-04-17: 5 [IU] via SUBCUTANEOUS
  Administered 2014-04-17: 3 [IU] via SUBCUTANEOUS
  Administered 2014-04-18 (×2): 5 [IU] via SUBCUTANEOUS

## 2014-04-16 MED ORDER — HYDROMORPHONE HCL 1 MG/ML IJ SOLN
1.0000 mg | INTRAMUSCULAR | Status: DC | PRN
  Administered 2014-04-16 – 2014-04-19 (×10): 1 mg via INTRAVENOUS
  Filled 2014-04-16 (×10): qty 1

## 2014-04-16 MED ORDER — MORPHINE SULFATE 2 MG/ML IJ SOLN: 2.0000 mg | INTRAMUSCULAR | Status: DC | PRN

## 2014-04-16 MED ORDER — SODIUM CHLORIDE 0.9 % IV SOLN
INTRAVENOUS | Status: AC
  Administered 2014-04-16 – 2014-04-17 (×2): via INTRAVENOUS

## 2014-04-16 NOTE — Progress Notes (Signed)
[facility] PROGRESS NOTE  Assessment/Plan: Hepatic encephalopathy: - Cont lactulose, her encephalopathy has resolved. - GI was consulted they added rifaximin. - Large amounts of stools output through stoma.  Leukocytosis due to infectious colitis: - started on IV  Flagyl and Cipro, CT abdomen and pelvis showed right sided colitis. Cont to have large amount of output through stoma. - - C. difficile negative. - start dilaudid.  Complex multi septated pelvic mass  - vaginal ultrasound confirmed looking back through record [name] has been following will defer management at this point. - patient with active infection as above.   Cirrhosis of liver not due to alcohol: - Most likely due to [place], CT scan of the abdomen and pelvis showed stable liver cirrhosis. - Continue low-sodium diet. Restrict fluids.  Essential hypertension: - No changes made to her regimen.  Type 2 diabetes, uncontrolled, with neuropathy - A1c of 9.6 blood glucose uncontrolled here in the hospital. - Increase long-acting insulin.   Hyponatremia: - Etiology prerenal, With large amounts of stool output through her stoma. - Continue IV hydration aggressively, cont to monitor sodium.  Code Status: full Family Communication: none  Disposition Plan: inpatient   Consultants:  GI  Procedures:  CT head  Antibiotics:  rifaximin  HPI/Subjective: Complaining of abdominal pain. She tolerated diet.  Objective: Filed Vitals:   04/15/14 2009 04/15/14 2015 04/16/14 0533 04/16/14 0902  BP: 122/46  133/47   Pulse: 66  65   Temp: 98.6 F (37 C)  98.1 F (36.7 C)   TempSrc: Oral  Oral   Resp: 16  18   Height:      Weight:   97.886 kg (215 lb 12.8 oz)   SpO2: 98% 98% 100% 100%    Intake/Output Summary (Last 24 hours) at 04/16/14 0916 Last data filed at 04/16/14 0840  Gross per 24 hour  Intake 3782.92 ml  Output   4700 ml  Net -917.08 ml   Filed Weights   04/12/14 0239 04/15/14 0418  04/16/14 0533  Weight: 94.847 kg (209 lb 1.6 oz) 95.391 kg (210 lb 4.8 oz) 97.886 kg (215 lb 12.8 oz)    Exam:  General: Alert, awake, oriented x3, in no acute distress.  HEENT: No bruits, no goiter.  Heart: Regular rate and rhythm. Lungs: Good air movement, clear Abdomen: Soft, mild left lower quadrant tenderness., nondistended, positive bowel sounds.  Neuro: Grossly intact, nonfocal. Positive asterixis   Data Reviewed: Basic Metabolic Panel:  Recent Labs Lab 04/12/14 0710 04/13/14 0520 04/14/14 0600 04/15/14 0528 04/16/14 0525  NA 138 135 132* 128* 129*  K 4.1 4.8 4.5 4.2 4.0  CL 105 100 94* 91* 92*  CO2 28 24 26 26 27   GLUCOSE 69* 196* 251* 268* 204*  BUN 30* 23 24* 25* 22  CREATININE 1.26* 1.33* 1.36* 1.45* 1.25*  CALCIUM 10.5 10.2 10.5 9.9 9.5   Liver Function Tests:  Recent Labs Lab 04/11/14 2246 04/12/14 0710 04/13/14 0520 04/14/14 0600 04/15/14 0528  AST 54* 52* 50* 40* 33  ALT 33 31 31 29 26   ALKPHOS 121* 107 109 114 114  BILITOT 0.7 0.5 0.7 0.6 0.5  PROT 7.6 6.9 6.9 7.4 7.3  ALBUMIN 3.8 3.4* 3.4* 3.6 3.6    Recent Labs Lab 04/11/14 2246  LIPASE 42    Recent Labs Lab 04/12/14 0710 04/12/14 1828 04/13/14 0645 04/13/14 1815 04/14/14 0600  AMMONIA 59* 56* 77* 45* 40*   CBC:  Recent Labs Lab 04/12/14 0710 04/13/14 0520 04/14/14  0600 04/15/14 0528 04/16/14 0525  WBC 12.5* 11.5* 14.0* 17.3* 17.2*  NEUTROABS 5.0 4.5 7.2 9.6* 8.9*  HGB 8.8* 8.5* 8.9* 9.1* 9.2*  HCT 27.3* 26.8* 28.7* 29.3* 28.5*  MCV 91.3 91.8 92.0 91.6 90.8  PLT 374 360 373 415* 374   Cardiac Enzymes: No results for input(s): CKTOTAL, CKMB, CKMBINDEX, TROPONINI in the last 168 hours. BNP (last 3 results)  Recent Labs  03/04/14 2100 04/12/14 0710  BNP 35.8 311.9*    ProBNP (last 3 results)  Recent Labs  07/16/13 0650 09/02/13 1803 03/30/14 1230  PROBNP 933.6* 558.0* 71.0    CBG:  Recent Labs Lab 04/15/14 1148 04/15/14 1558 04/15/14 2011  04/16/14 0015 04/16/14 0435  GLUCAP 210* 183* 242* 244* 207*    Recent Results (from the past 240 hour(s))  Urine culture     Status: None   Collection Time: 04/12/14  2:02 AM  Result Value Ref Range Status   Specimen Description URINE, RANDOM  Final   Special Requests NONE  Final   Colony Count   Final    25,000 COLONIES/ML Performed at [facility]    Culture   Final    Multiple bacterial morphotypes present, none predominant. Suggest appropriate recollection if clinically indicated. Performed at [facility]    Report Status 04/13/2014 FINAL  Final  MRSA PCR Screening     Status: None   Collection Time: 04/12/14  3:44 AM  Result Value Ref Range Status   MRSA by PCR NEGATIVE NEGATIVE Final    Comment:        The GeneXpert MRSA Assay (FDA approved for NASAL specimens only), is one component of a comprehensive MRSA colonization surveillance program. It is not intended to diagnose MRSA infection nor to guide or monitor treatment for MRSA infections.   Clostridium Difficile by PCR     Status: None   Collection Time: 04/14/14 11:00 AM  Result Value Ref Range Status   C difficile by pcr NEGATIVE NEGATIVE Final     Studies: Ct Abdomen Pelvis Wo Contrast  04/15/2014   CLINICAL DATA:  Abdominal pain acute. Leukocytosis. History of cervical cancer and lung cancer.  EXAM: CT ABDOMEN AND PELVIS WITHOUT CONTRAST  TECHNIQUE: Multidetector CT imaging of the abdomen and pelvis was performed following the standard protocol without IV contrast.  COMPARISON:  CT abdomen pelvis 01/26/2014, 03/04/2014  FINDINGS: Mild left lower lobe scarring or atelectasis. Lungs otherwise clear. Cardiac enlargement. Patient has a transvenous pacemaker.  The liver is enlarged. The liver has irregular contours consistent with cirrhosis. The spleen has been removed. No focal liver lesion. Gallbladder has been removed. Small hypodensity uncinate process of the pancreas better seen on the prior  contrast-enhanced study. No evidence of pancreatitis.  The kidneys show no obstruction or mass. No renal calculi. Urinary bladder normal.  Postop hysterectomy. Loculated cystic mass in the pelvis behind the bladder measuring 8 x 4 cm. This has slowly grown since the prior study of 01/26/2014. No enhancing soft tissue component. This could be loculated peritoneal fluid however cystic mass of the ovary is not excluded and further imaging evaluation is warranted.  Moderate thickening of the cecum and right colon suggestive of colitis. This was not present on the prior study. The left colon is not thickened. Negative for bowel obstruction.  Ventral hernia left lower quadrant containing and ostomy is well as small bowel loops.  IMPRESSION: Cirrhosis of the liver  Enlarging loculated cystic mass in the pelvis behind the bladder measuring  8 x 4 cm. Abscess not considered likely given lack of soft tissue component. This could be loculated peritoneal fluid or a cystic mass of the ovary. Pelvic ultrasound recommended for further evaluation  Moderate thickening of the cecum and right colon suggestive of colitis. This has developed since the prior study. Negative for bowel obstruction.   Electronically Signed   By: Franchot Gallo M.D.   On: 04/15/2014 14:17   US Transvaginal Non-ob  04/16/2014   CLINICAL DATA:  Adnexal mass.  EXAM: TRANSABDOMINAL AND TRANSVAGINAL ULTRASOUND OF PELVIS  TECHNIQUE: Both transabdominal and transvaginal ultrasound examinations of the pelvis were performed. Transabdominal technique was performed for global imaging of the pelvis including uterus, ovaries, adnexal regions, and pelvic cul-de-sac.  COMPARISON:  CT scans dated 04/15/2014 and 04/12/2014 and 03/04/2014 and 01/26/2014 and pelvic ultrasound dated 03/23/2014  FINDINGS: Uterus  Removed  Right ovary  Not visualized.  Left ovary  Not visualized.  Other findings  Complex right adnexal cystic mass measuring 9.2 x 7.8 x 4.4 cm, unchanged since  the prior ultrasound exam.  No free fluid.  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   Electronically Signed   By: Lorriane Shire M.D.   On: 04/16/2014 08:36   US Pelvis Complete  04/16/2014   CLINICAL DATA:  Adnexal mass.  EXAM: TRANSABDOMINAL AND TRANSVAGINAL ULTRASOUND OF PELVIS  TECHNIQUE: Both transabdominal and transvaginal ultrasound examinations of the pelvis were performed. Transabdominal technique was performed for global imaging of the pelvis including uterus, ovaries, adnexal regions, and pelvic cul-de-sac.  COMPARISON:  CT scans dated 04/15/2014 and 04/12/2014 and 03/04/2014 and 01/26/2014 and pelvic ultrasound dated 03/23/2014  FINDINGS: Uterus  Removed  Right ovary  Not visualized.  Left ovary  Not visualized.  Other findings  Complex right adnexal cystic mass measuring 9.2 x 7.8 x 4.4 cm, unchanged since the prior ultrasound exam.  No free fluid.  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   Electronically Signed   By: Lorriane Shire M.D.   On: 04/16/2014 08:36    Scheduled Meds: . atorvastatin  20 mg Oral q1800  . ciprofloxacin  400 mg Intravenous Q12H  . dicyclomine  10 mg Oral TID  . folic acid  1 mg Oral QPM  . hydrALAZINE  37.5 mg Oral BID  . insulin aspart  0-9 Units Subcutaneous 6 times per day  . insulin detemir  15 Units Subcutaneous BID  . isosorbide dinitrate  20 mg Oral BID  . lactulose  10 g Oral Daily  . losartan  50 mg Oral Daily  . metoCLOPramide  10 mg Oral TID  . metronidazole  500 mg Intravenous Q8H  . mometasone-formoterol  2 puff Inhalation BID  . pantoprazole  80 mg Oral Q1200  . potassium chloride  10 mEq Oral Daily  . rifaximin  550 mg Oral BID  . sodium chloride  500 mL Intravenous Once  . sodium chloride  3 mL Intravenous Q12H   Continuous Infusions: . sodium chloride        Charlynne Cousins  [facility] Pager [phone]. If 7PM-7AM, please contact night-coverage at [email or web address], password [facility] 04/16/2014, 9:16 AM  LOS: 4 days

## 2014-04-16 NOTE — Progress Notes (Signed)
Pt's R arm IV infiltrated during bolus infusion. RN and IVT attempted to restart with no success. MD paged 2 times in regards to what to do for IV access. No call back or new orders entered in EPIC. Will pass on to next shift to try. Callie Fielding RN

## 2014-04-17 LAB — BASIC METABOLIC PANEL
ANION GAP: 10 (ref 5–15)
BUN: 23 mg/dL (ref 6–23)
CALCIUM: 9.2 mg/dL (ref 8.4–10.5)
CO2: 23 mmol/L (ref 19–32)
CREATININE: 1.19 mg/dL — AB (ref 0.50–1.10)
Chloride: 98 mmol/L (ref 96–112)
GFR calc non Af Amer: 48 mL/min — ABNORMAL LOW (ref >90)
GFR, EST AFRICAN AMERICAN: 56 mL/min — AB (ref >90)
GLUCOSE: 230 mg/dL — AB (ref 70–99)
Potassium: 4.5 mmol/L (ref 3.5–5.1)
Sodium: 131 mmol/L — ABNORMAL LOW (ref 135–145)

## 2014-04-17 LAB — CBC WITH DIFFERENTIAL/PLATELET
Basophils Absolute: 0.1 10*3/uL (ref 0.0–0.1)
Basophils Relative: 1 % (ref 0–1)
Eosinophils Absolute: 1 10*3/uL — ABNORMAL HIGH (ref 0.0–0.7)
Eosinophils Relative: 7 % — ABNORMAL HIGH (ref 0–5)
HCT: 26.2 % — ABNORMAL LOW (ref 36.0–46.0)
HEMOGLOBIN: 8.5 g/dL — AB (ref 12.0–15.0)
LYMPHS PCT: 17 % (ref 12–46)
Lymphs Abs: 2.4 10*3/uL (ref 0.7–4.0)
MCH: 29.6 pg (ref 26.0–34.0)
MCHC: 32.4 g/dL (ref 30.0–36.0)
MCV: 91.3 fL (ref 78.0–100.0)
Monocytes Absolute: 3.5 10*3/uL — ABNORMAL HIGH (ref 0.1–1.0)
Monocytes Relative: 25 % — ABNORMAL HIGH (ref 3–12)
NEUTROS ABS: 7.1 10*3/uL (ref 1.7–7.7)
NEUTROS PCT: 51 % (ref 43–77)
Platelets: 377 10*3/uL (ref 150–400)
RBC: 2.87 MIL/uL — ABNORMAL LOW (ref 3.87–5.11)
RDW: 14.6 % (ref 11.5–15.5)
WBC: 14 10*3/uL — ABNORMAL HIGH (ref 4.0–10.5)

## 2014-04-17 LAB — GLUCOSE, CAPILLARY
Glucose-Capillary: 211 mg/dL — ABNORMAL HIGH (ref 70–99)
Glucose-Capillary: 240 mg/dL — ABNORMAL HIGH (ref 70–99)
Glucose-Capillary: 272 mg/dL — ABNORMAL HIGH (ref 70–99)
Glucose-Capillary: 262 mg/dL — ABNORMAL HIGH (ref 70–99)

## 2014-04-17 LAB — CLOSTRIDIUM DIFFICILE BY PCR: CDIFFPCR: NEGATIVE

## 2014-04-17 LAB — OCCULT BLOOD X 1 CARD TO LAB, STOOL: FECAL OCCULT BLD: POSITIVE — AB

## 2014-04-17 MED ORDER — INSULIN DETEMIR 100 UNIT/ML ~~LOC~~ SOLN
25.0000 [IU] | Freq: Two times a day (BID) | SUBCUTANEOUS | Status: DC
  Administered 2014-04-17 – 2014-04-18 (×3): 25 [IU] via SUBCUTANEOUS
  Filled 2014-04-17 (×5): qty 0.25

## 2014-04-17 NOTE — Progress Notes (Signed)
[facility] PROGRESS NOTE  Interval history: 64 year old female with past medical history cirrhosis not alcohol induced on lactulose, with a complex pelvic mass followed by her PCP as an outpatient (they have not determine what it is) comes into the hospital for acute hepatic encephalopathy. She was started on IV fluids lactulose and rifaximin and her encephalopathy resolved. After this she developed leukocytosis with right lower quadrant pain a CT scan of the abdomen and pelvis was repeated that showed a mass on a ride side colitis, she was started empirically on IV Cipro and Flagyl. During this time she also developed high output from her stoma and became dehydrated. She was treated with aggressive hydration. It is documented that should pedal 14 L on 04/16/2014, this is not possible she is not on any diuretic therapy. The fact that her hyponatremia hypochloremia and acute renal failure improving points otherwise.  Assessment/Plan: Hepatic encephalopathy: - Cont lactulose and rifaximin, her encephalopathy has resolved. - GI was consulted they added rifaximin. - Large amounts of stools output through stoma. - try to keep leanne daughter informed daily [phone].  Leukocytosis due to infectious colitis: - Started on IV  Flagyl and Cipro, CT abdomen and pelvis showed right sided colitis. Cont to have large amount of output through stoma. - repeat c. Dif. - start dilaudid pain controlled.  Complex multi septated pelvic mass  - Vaginal ultrasound confirmed looking back through record [name] and Gyn/oncologist has been following will defer management at this point. - patient with active infection as above.   Cirrhosis of liver not due to alcohol: - Most likely due to [place], CT scan of the abdomen and pelvis showed stable liver cirrhosis. - Continue low-sodium diet. Restrict fluids.  Essential hypertension: - No changes made to her regimen.  Type 2 diabetes, uncontrolled, with  neuropathy - A1c of 9.6 blood glucose uncontrolled here in the hospital. - Increase long-acting insulin 25 units.   Hyponatremia: - Etiology prerenal, With large amounts of stool output through her stoma. - Continue IV hydration aggressively, cont to monitor sodium.  Code Status: full Family Communication: none  Disposition Plan: inpatient   Consultants:  GI  Procedures:  CT head  Antibiotics:  rifaximin  HPI/Subjective: Right lower quadrant abdominal pain some what improved.  Objective: Filed Vitals:   04/16/14 1336 04/16/14 1953 04/16/14 2118 04/17/14 0354  BP: 145/48  136/51 138/44  Pulse: 64 62 64 61  Temp: 97.8 F (36.6 C)  98.1 F (36.7 C) 97.9 F (36.6 C)  TempSrc: Oral  Oral Oral  Resp: 16 16 16 16   Height:      Weight:    101.152 kg (223 lb)  SpO2: 100% 97% 99% 98%    Intake/Output Summary (Last 24 hours) at 04/17/14 0730 Last data filed at 04/17/14 0600  Gross per 24 hour  Intake   4560 ml  Output  14550 ml  Net  -9990 ml   Filed Weights   04/15/14 0418 04/16/14 0533 04/17/14 0354  Weight: 95.391 kg (210 lb 4.8 oz) 97.886 kg (215 lb 12.8 oz) 101.152 kg (223 lb)    Exam:  General: Alert, awake, oriented x3, in no acute distress.  HEENT: No bruits, no goiter.  Heart: Regular rate and rhythm. Lungs: Good air movement, clear Abdomen: Soft, mild right lower quadrant tenderness., nondistended, positive bowel sounds.  Neuro: Grossly intact, nonfocal. Positive asterixis   Data Reviewed: Basic Metabolic Panel:  Recent Labs Lab 04/13/14 0520 04/14/14 0600 04/15/14 0528 04/16/14 0525  04/17/14 0542  NA 135 132* 128* 129* 131*  K 4.8 4.5 4.2 4.0 4.5  CL 100 94* 91* 92* 98  CO2 24 26 26 27 23   GLUCOSE 196* 251* 268* 204* 230*  BUN 23 24* 25* 22 23  CREATININE 1.33* 1.36* 1.45* 1.25* 1.19*  CALCIUM 10.2 10.5 9.9 9.5 9.2   Liver Function Tests:  Recent Labs Lab 04/11/14 2246 04/12/14 0710 04/13/14 0520 04/14/14 0600 04/15/14 0528    AST 54* 52* 50* 40* 33  ALT 33 31 31 29 26   ALKPHOS 121* 107 109 114 114  BILITOT 0.7 0.5 0.7 0.6 0.5  PROT 7.6 6.9 6.9 7.4 7.3  ALBUMIN 3.8 3.4* 3.4* 3.6 3.6    Recent Labs Lab 04/11/14 2246  LIPASE 42    Recent Labs Lab 04/12/14 0710 04/12/14 1828 04/13/14 0645 04/13/14 1815 04/14/14 0600  AMMONIA 59* 56* 77* 45* 40*   CBC:  Recent Labs Lab 04/12/14 0710 04/13/14 0520 04/14/14 0600 04/15/14 0528 04/16/14 0525  WBC 12.5* 11.5* 14.0* 17.3* 17.2*  NEUTROABS 5.0 4.5 7.2 9.6* 8.9*  HGB 8.8* 8.5* 8.9* 9.1* 9.2*  HCT 27.3* 26.8* 28.7* 29.3* 28.5*  MCV 91.3 91.8 92.0 91.6 90.8  PLT 374 360 373 415* 374   Cardiac Enzymes: No results for input(s): CKTOTAL, CKMB, CKMBINDEX, TROPONINI in the last 168 hours. BNP (last 3 results)  Recent Labs  03/04/14 2100 04/12/14 0710  BNP 35.8 311.9*    ProBNP (last 3 results)  Recent Labs  07/16/13 0650 09/02/13 1803 03/30/14 1230  PROBNP 933.6* 558.0* 71.0    CBG:  Recent Labs Lab 04/16/14 0435 04/16/14 0745 04/16/14 1143 04/16/14 1701 04/16/14 2154  GLUCAP 207* 219* 289* 223* 254*    Recent Results (from the past 240 hour(s))  Urine culture     Status: None   Collection Time: 04/12/14  2:02 AM  Result Value Ref Range Status   Specimen Description URINE, RANDOM  Final   Special Requests NONE  Final   Colony Count   Final    25,000 COLONIES/ML Performed at [facility]    Culture   Final    Multiple bacterial morphotypes present, none predominant. Suggest appropriate recollection if clinically indicated. Performed at [facility]    Report Status 04/13/2014 FINAL  Final  MRSA PCR Screening     Status: None   Collection Time: 04/12/14  3:44 AM  Result Value Ref Range Status   MRSA by PCR NEGATIVE NEGATIVE Final    Comment:        The GeneXpert MRSA Assay (FDA approved for NASAL specimens only), is one component of a comprehensive MRSA colonization surveillance program. It is  not intended to diagnose MRSA infection nor to guide or monitor treatment for MRSA infections.   Clostridium Difficile by PCR     Status: None   Collection Time: 04/14/14 11:00 AM  Result Value Ref Range Status   C difficile by pcr NEGATIVE NEGATIVE Final     Studies: Ct Abdomen Pelvis Wo Contrast  04/15/2014   CLINICAL DATA:  Abdominal pain acute. Leukocytosis. History of cervical cancer and lung cancer.  EXAM: CT ABDOMEN AND PELVIS WITHOUT CONTRAST  TECHNIQUE: Multidetector CT imaging of the abdomen and pelvis was performed following the standard protocol without IV contrast.  COMPARISON:  CT abdomen pelvis 01/26/2014, 03/04/2014  FINDINGS: Mild left lower lobe scarring or atelectasis. Lungs otherwise clear. Cardiac enlargement. Patient has a transvenous pacemaker.  The liver is enlarged. The liver has  irregular contours consistent with cirrhosis. The spleen has been removed. No focal liver lesion. Gallbladder has been removed. Small hypodensity uncinate process of the pancreas better seen on the prior contrast-enhanced study. No evidence of pancreatitis.  The kidneys show no obstruction or mass. No renal calculi. Urinary bladder normal.  Postop hysterectomy. Loculated cystic mass in the pelvis behind the bladder measuring 8 x 4 cm. This has slowly grown since the prior study of 01/26/2014. No enhancing soft tissue component. This could be loculated peritoneal fluid however cystic mass of the ovary is not excluded and further imaging evaluation is warranted.  Moderate thickening of the cecum and right colon suggestive of colitis. This was not present on the prior study. The left colon is not thickened. Negative for bowel obstruction.  Ventral hernia left lower quadrant containing and ostomy is well as small bowel loops.  IMPRESSION: Cirrhosis of the liver  Enlarging loculated cystic mass in the pelvis behind the bladder measuring 8 x 4 cm. Abscess not considered likely given lack of soft tissue  component. This could be loculated peritoneal fluid or a cystic mass of the ovary. Pelvic ultrasound recommended for further evaluation  Moderate thickening of the cecum and right colon suggestive of colitis. This has developed since the prior study. Negative for bowel obstruction.   Electronically Signed   By: Franchot Gallo M.D.   On: 04/15/2014 14:17   US Transvaginal Non-ob  04/16/2014   CLINICAL DATA:  Adnexal mass.  EXAM: TRANSABDOMINAL AND TRANSVAGINAL ULTRASOUND OF PELVIS  TECHNIQUE: Both transabdominal and transvaginal ultrasound examinations of the pelvis were performed. Transabdominal technique was performed for global imaging of the pelvis including uterus, ovaries, adnexal regions, and pelvic cul-de-sac.  COMPARISON:  CT scans dated 04/15/2014 and 04/12/2014 and 03/04/2014 and 01/26/2014 and pelvic ultrasound dated 03/23/2014  FINDINGS: Uterus  Removed  Right ovary  Not visualized.  Left ovary  Not visualized.  Other findings  Complex right adnexal cystic mass measuring 9.2 x 7.8 x 4.4 cm, unchanged since the prior ultrasound exam.  No free fluid.  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   Electronically Signed   By: Lorriane Shire M.D.   On: 04/16/2014 08:36   US Pelvis Complete  04/16/2014   CLINICAL DATA:  Adnexal mass.  EXAM: TRANSABDOMINAL AND TRANSVAGINAL ULTRASOUND OF PELVIS  TECHNIQUE: Both transabdominal and transvaginal ultrasound examinations of the pelvis were performed. Transabdominal technique was performed for global imaging of the pelvis including uterus, ovaries, adnexal regions, and pelvic cul-de-sac.  COMPARISON:  CT scans dated 04/15/2014 and 04/12/2014 and 03/04/2014 and 01/26/2014 and pelvic ultrasound dated 03/23/2014  FINDINGS: Uterus  Removed  Right ovary  Not visualized.  Left ovary  Not visualized.  Other findings  Complex right adnexal cystic mass measuring 9.2 x 7.8 x 4.4 cm,  unchanged since the prior ultrasound exam.  No free fluid.  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   Electronically Signed   By: Lorriane Shire M.D.   On: 04/16/2014 08:36    Scheduled Meds: . atorvastatin  20 mg Oral q1800  . ciprofloxacin  400 mg Intravenous Q12H  . dicyclomine  10 mg Oral TID  . folic acid  1 mg Oral QPM  . hydrALAZINE  37.5 mg Oral BID  . insulin aspart  0-9 Units Subcutaneous TID WC  . insulin detemir  15 Units  Subcutaneous BID  . isosorbide dinitrate  20 mg Oral BID  . lactulose  10 g Oral Daily  . losartan  50 mg Oral Daily  . metoCLOPramide  10 mg Oral TID  . metronidazole  500 mg Intravenous Q8H  . mometasone-formoterol  2 puff Inhalation BID  . pantoprazole  80 mg Oral Q1200  . potassium chloride  10 mEq Oral Daily  . rifaximin  550 mg Oral BID  . sodium chloride  1,000 mL Intravenous Once  . sodium chloride  500 mL Intravenous Once  . sodium chloride  3 mL Intravenous Q12H   Continuous Infusions: . sodium chloride 125 mL/hr at 04/17/14 [place], ABRAHAM  [facility] Pager [phone]. If 7PM-7AM, please contact night-coverage at [email or web address], password [facility] 04/17/2014, 7:30 AM  LOS: 5 days

## 2014-04-18 DIAGNOSIS — E871 Hypo-osmolality and hyponatremia: Secondary | ICD-10-CM

## 2014-04-18 LAB — CBC
HEMATOCRIT: 29 % — AB (ref 36.0–46.0)
HEMOGLOBIN: 9.2 g/dL — AB (ref 12.0–15.0)
MCH: 28.9 pg (ref 26.0–34.0)
MCHC: 31.7 g/dL (ref 30.0–36.0)
MCV: 91.2 fL (ref 78.0–100.0)
Platelets: 359 10*3/uL (ref 150–400)
RBC: 3.18 MIL/uL — ABNORMAL LOW (ref 3.87–5.11)
RDW: 14.8 % (ref 11.5–15.5)
WBC: 17.6 10*3/uL — ABNORMAL HIGH (ref 4.0–10.5)

## 2014-04-18 LAB — COMPREHENSIVE METABOLIC PANEL
ALBUMIN: 3.3 g/dL — AB (ref 3.5–5.2)
ALT: 22 U/L (ref 0–35)
ANION GAP: 7 (ref 5–15)
AST: 27 U/L (ref 0–37)
Alkaline Phosphatase: 106 U/L (ref 39–117)
BILIRUBIN TOTAL: 0.4 mg/dL (ref 0.3–1.2)
BUN: 27 mg/dL — AB (ref 6–23)
CHLORIDE: 99 mmol/L (ref 96–112)
CO2: 22 mmol/L (ref 19–32)
CREATININE: 1.23 mg/dL — AB (ref 0.50–1.10)
Calcium: 9.3 mg/dL (ref 8.4–10.5)
GFR calc Af Amer: 53 mL/min — ABNORMAL LOW (ref >90)
GFR calc non Af Amer: 46 mL/min — ABNORMAL LOW (ref >90)
Glucose, Bld: 222 mg/dL — ABNORMAL HIGH (ref 70–99)
Potassium: 4.7 mmol/L (ref 3.5–5.1)
Sodium: 128 mmol/L — ABNORMAL LOW (ref 135–145)
TOTAL PROTEIN: 7 g/dL (ref 6.0–8.3)

## 2014-04-18 LAB — GLUCOSE, CAPILLARY
Glucose-Capillary: 259 mg/dL — ABNORMAL HIGH (ref 70–99)
Glucose-Capillary: 286 mg/dL — ABNORMAL HIGH (ref 70–99)
Glucose-Capillary: 249 mg/dL — ABNORMAL HIGH (ref 70–99)
Glucose-Capillary: 269 mg/dL — ABNORMAL HIGH (ref 70–99)

## 2014-04-18 MED ORDER — FUROSEMIDE 10 MG/ML IJ SOLN
40.0000 mg | Freq: Once | INTRAMUSCULAR | Status: AC
  Administered 2014-04-18: 40 mg via INTRAVENOUS
  Filled 2014-04-18: qty 4

## 2014-04-18 MED ORDER — FUROSEMIDE 40 MG PO TABS
40.0000 mg | ORAL_TABLET | Freq: Two times a day (BID) | ORAL | Status: DC
  Administered 2014-04-19 – 2014-04-21 (×5): 40 mg via ORAL
  Filled 2014-04-18 (×5): qty 1

## 2014-04-18 MED ORDER — HEPARIN SODIUM (PORCINE) 5000 UNIT/ML IJ SOLN
5000.0000 [IU] | Freq: Three times a day (TID) | INTRAMUSCULAR | Status: DC
  Filled 2014-04-18 (×4): qty 1

## 2014-04-18 MED ORDER — INSULIN ASPART 100 UNIT/ML ~~LOC~~ SOLN
0.0000 [IU] | Freq: Three times a day (TID) | SUBCUTANEOUS | Status: DC
  Administered 2014-04-18: 5 [IU] via SUBCUTANEOUS
  Administered 2014-04-19: 3 [IU] via SUBCUTANEOUS
  Administered 2014-04-19 – 2014-04-20 (×2): 5 [IU] via SUBCUTANEOUS
  Administered 2014-04-20: 2 [IU] via SUBCUTANEOUS
  Administered 2014-04-21: 3 [IU] via SUBCUTANEOUS

## 2014-04-18 MED ORDER — INSULIN DETEMIR 100 UNIT/ML ~~LOC~~ SOLN
30.0000 [IU] | Freq: Two times a day (BID) | SUBCUTANEOUS | Status: DC
  Administered 2014-04-18 – 2014-04-21 (×6): 30 [IU] via SUBCUTANEOUS
  Filled 2014-04-18 (×7): qty 0.3

## 2014-04-18 MED ORDER — INSULIN ASPART 100 UNIT/ML ~~LOC~~ SOLN
10.0000 [IU] | Freq: Three times a day (TID) | SUBCUTANEOUS | Status: DC
  Administered 2014-04-18 – 2014-04-21 (×5): 10 [IU] via SUBCUTANEOUS

## 2014-04-18 MED ORDER — INSULIN ASPART 100 UNIT/ML ~~LOC~~ SOLN
0.0000 [IU] | Freq: Every day | SUBCUTANEOUS | Status: DC
  Administered 2014-04-18 – 2014-04-19 (×2): 3 [IU] via SUBCUTANEOUS

## 2014-04-18 MED ORDER — SODIUM CHLORIDE 0.9 % IV SOLN: INTRAVENOUS | Status: DC

## 2014-04-18 NOTE — Progress Notes (Signed)
[facility] PROGRESS NOTE  [name] OHY:[id number] DOB: 25-Nov-1950 DOA: 04/11/2014 PCP: [name], MD   brief narrative 64 year old female with history of nonalcoholic cirrhosis of liver, history of colostomy, recently than a complex pelvic mass (following with GYN as outpatient) admitted to the hospital with acute hepatic encephalopathy. She was started on IV hydration, lactulose and rifaximin following which the encephalopathy resolved hospital course prolonged due to development of right-sided colitis, high output from the stoma and dehydration.   Assessment/Plan: Acute infectious colitis with high stomal output On empiric IV Cipro and Flagyl. Patient continues to have large stomal output. (documented 600 cc past 24 hrs) . C. difficile negative. Has persistent leucocytosis. Remains afebrile. If leukocytosis not improved and continues to have high stomal output will need repeat CT scan of the abdomen and pelvis and surgery consult tomorrow. -Supportive care with pain control and antiemetics. -Monitor stomal output closely.  Hepatic encephalopathy Resolved. Continue lactulose and rifaximin. GI consult appreciated.  Complex multiseptated pelvic mass She was recently evaluated by outpatient GYN Dr. Everitt Amber ( 04/11/2014) and thought this was likely an ovarian cyst and less likely to be a malignancy. She recommended of repeat follow-up ultrasound in 3 months.  Hyponatremia Possibly related to high stomal output. Patient was hydrated aggressively during hospitalization. Now has bilateral leg edema and mild right basilar crackles. Will order dose IV Lasix and follow with repeat sodium in the morning. Patient was also on high-dose Lasix at home. Will resume her on po  Lasix 40 mg twice daily from tomorrow.  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 Increase Levemir to 30 units twice a day and add NovoLog 10 units 3 times a day for meal coverage.  S  Nonalcoholic cirrhosis of liver Stable on CT scan of abdomen and pelvis. Continue low sodium diet with fluid restriction. Follow-up with GI as outpatient  Essential hypertension Continue home medications.  DVT prophylaxis: sq heparin  Code Status:full code Family Communication: will notify daughter Disposition Plan: Continue inpatient care. Discharge prolonged due to ongoing leukocytosis, high stomal output, abdominal pains and hyponatremia   Consultants:  [facility]  Procedures:  CT abd and pelvis on 3/18   [place] abd and transvaginal  Antibiotics:  IV cipro and flagyl 3/18--  HPI/Subjective: Patient seen and examined. Still has off-and-on abdominal pain  Objective: Filed Vitals:   04/18/14 0538  BP: 154/51  Pulse: 64  Temp: 97.8 F (36.6 C)  Resp: 20    Intake/Output Summary (Last 24 hours) at 04/18/14 1419 Last data filed at 04/18/14 1208  Gross per 24 hour  Intake   1020 ml  Output   5150 ml  Net  -4130 ml   Filed Weights   04/16/14 0533 04/17/14 0354 04/18/14 0538  Weight: 97.886 kg (215 lb 12.8 oz) 101.152 kg (223 lb) 101.515 kg (223 lb 12.8 oz)    Exam:   General:  Elderly female lying in bed in no distress  HEENT: Pallor, moist oral mucosa, supple neck  Chest: Fine right basilar crackles, no rhonchi or wheeze  CVS: Normal S1 and S2, no murmurs rub or gallop  GI: Soft, nondistended, colostomy with liquid stool output, right lower quadrant and epigastric tenderness to palpation,  Musculoskeletal, warm, 1+ pitting edema  CNS: Alert and oriented  Data Reviewed: Basic Metabolic Panel:  Recent Labs Lab 04/14/14 0600 04/15/14 0528 04/16/14 0525 04/17/14 0542 04/18/14 0704  NA 132* 128* 129* 131* 128*  K 4.5 4.2 4.0  4.5 4.7  CL 94* 91* 92* 98 99  CO2 26 26 27 23 22   GLUCOSE 251* 268* 204* 230* 222*  BUN 24* 25* 22 23 27*  CREATININE 1.36* 1.45* 1.25* 1.19* 1.23*  CALCIUM 10.5 9.9 9.5 9.2 9.3   Liver Function Tests:  Recent  Labs Lab 04/12/14 0710 04/13/14 0520 04/14/14 0600 04/15/14 0528 04/18/14 0704  AST 52* 50* 40* 33 27  ALT 31 31 29 26 22   ALKPHOS 107 109 114 114 106  BILITOT 0.5 0.7 0.6 0.5 0.4  PROT 6.9 6.9 7.4 7.3 7.0  ALBUMIN 3.4* 3.4* 3.6 3.6 3.3*    Recent Labs Lab 04/11/14 2246  LIPASE 42    Recent Labs Lab 04/12/14 0710 04/12/14 1828 04/13/14 0645 04/13/14 1815 04/14/14 0600  AMMONIA 59* 56* 77* 45* 40*   CBC:  Recent Labs Lab 04/13/14 0520 04/14/14 0600 04/15/14 0528 04/16/14 0525 04/17/14 0542 04/18/14 0704  WBC 11.5* 14.0* 17.3* 17.2* 14.0* 17.6*  NEUTROABS 4.5 7.2 9.6* 8.9* 7.1  --   HGB 8.5* 8.9* 9.1* 9.2* 8.5* 9.2*  HCT 26.8* 28.7* 29.3* 28.5* 26.2* 29.0*  MCV 91.8 92.0 91.6 90.8 91.3 91.2  PLT 360 373 415* 374 377 359   Cardiac Enzymes: No results for input(s): CKTOTAL, CKMB, CKMBINDEX, TROPONINI in the last 168 hours. BNP (last 3 results)  Recent Labs  03/04/14 2100 04/12/14 0710  BNP 35.8 311.9*    ProBNP (last 3 results)  Recent Labs  07/16/13 0650 09/02/13 1803 03/30/14 1230  PROBNP 933.6* 558.0* 71.0    CBG:  Recent Labs Lab 04/17/14 1134 04/17/14 1638 04/17/14 2107 04/18/14 0727 04/18/14 1133  GLUCAP 240* 272* 262* 259* 286*    Recent Results (from the past 240 hour(s))  Urine culture     Status: None   Collection Time: 04/12/14  2:02 AM  Result Value Ref Range Status   Specimen Description URINE, RANDOM  Final   Special Requests NONE  Final   Colony Count   Final    25,000 COLONIES/ML Performed at [facility]    Culture   Final    Multiple bacterial morphotypes present, none predominant. Suggest appropriate recollection if clinically indicated. Performed at [facility]    Report Status 04/13/2014 FINAL  Final  MRSA PCR Screening     Status: None   Collection Time: 04/12/14  3:44 AM  Result Value Ref Range Status   MRSA by PCR NEGATIVE NEGATIVE Final    Comment:        The GeneXpert MRSA Assay  (FDA approved for NASAL specimens only), is one component of a comprehensive MRSA colonization surveillance program. It is not intended to diagnose MRSA infection nor to guide or monitor treatment for MRSA infections.   Clostridium Difficile by PCR     Status: None   Collection Time: 04/14/14 11:00 AM  Result Value Ref Range Status   C difficile by pcr NEGATIVE NEGATIVE Final  Clostridium Difficile by PCR     Status: None   Collection Time: 04/17/14  8:19 AM  Result Value Ref Range Status   C difficile by pcr NEGATIVE NEGATIVE Final     Studies: No results found.  Scheduled Meds: . atorvastatin  20 mg Oral q1800  . ciprofloxacin  400 mg Intravenous Q12H  . dicyclomine  10 mg Oral TID  . folic acid  1 mg Oral QPM  . hydrALAZINE  37.5 mg Oral BID  . insulin aspart  0-9 Units Subcutaneous TID WC  .  insulin detemir  25 Units Subcutaneous BID  . isosorbide dinitrate  20 mg Oral BID  . lactulose  10 g Oral Daily  . losartan  50 mg Oral Daily  . metoCLOPramide  10 mg Oral TID  . metronidazole  500 mg Intravenous Q8H  . mometasone-formoterol  2 puff Inhalation BID  . pantoprazole  80 mg Oral Q1200  . potassium chloride  10 mEq Oral Daily  . rifaximin  550 mg Oral BID  . sodium chloride  1,000 mL Intravenous Once  . sodium chloride  500 mL Intravenous Once  . sodium chloride  3 mL Intravenous Q12H   Continuous Infusions:     Time spent: 25 minutes    Kishana Battey, [place]  [facility] Pager [phone] If 7PM-7AM, please contact night-coverage at [email or web address], password [facility] 04/18/2014, 2:19 PM  LOS: 6 days

## 2014-04-18 NOTE — Progress Notes (Signed)
Inpatient Diabetes Program Recommendations  AACE/ADA: New Consensus Statement on Inpatient Glycemic Control (2013)  Target Ranges:  Prepandial:   less than 140 mg/dL      Peak postprandial:   less than 180 mg/dL (1-2 hours)      Critically ill patients:  140 - 180 mg/dL   Reason for Assessment:  Results for [name], [name] (MRN [id number]) as of 04/18/2014 13:02  Ref. Range 04/17/2014 11:34 04/17/2014 16:38 04/17/2014 21:07 04/18/2014 07:27 04/18/2014 11:33  Glucose-Capillary Latest Range: 70-99 mg/dL 240 (H) 272 (H) 262 (H) 259 (H) 286 (H)    Diabetes history: Type 2 diabetes Outpatient Diabetes medications: U500 insulin:  33 units breakfast (equivalent to 165 units U100 insulin)             33 units with lunch (equivalent to 165 units U100 insulin)                                                                               30 units with supper (equivalent to 150 units U100 insulin) Current orders for Inpatient glycemic control:   Inpatient Diabetes Program Recommendations Insulin - Basal: Increase Levemir to 30 units bid Insulin - Meal Coverage: Novolog 10 units tidwc for meal coverage insulin Diet: Add CHO mod med to soft diet  May also consider increasing Novolog correction to resistant.  Thanks, Adah Perl, RN, BC-ADM Inpatient Diabetes Coordinator Pager [phone]

## 2014-04-19 ENCOUNTER — Encounter (HOSPITAL_COMMUNITY): Payer: Self-pay | Admitting: Surgery

## 2014-04-19 ENCOUNTER — Inpatient Hospital Stay (HOSPITAL_COMMUNITY): Payer: Medicare Other

## 2014-04-19 LAB — BASIC METABOLIC PANEL
ANION GAP: 9 (ref 5–15)
BUN: 28 mg/dL — ABNORMAL HIGH (ref 6–23)
CO2: 24 mmol/L (ref 19–32)
Calcium: 9.7 mg/dL (ref 8.4–10.5)
Chloride: 102 mmol/L (ref 96–112)
Creatinine, Ser: 1.22 mg/dL — ABNORMAL HIGH (ref 0.50–1.10)
GFR calc Af Amer: 54 mL/min — ABNORMAL LOW (ref >90)
GFR calc non Af Amer: 46 mL/min — ABNORMAL LOW (ref >90)
Glucose, Bld: 204 mg/dL — ABNORMAL HIGH (ref 70–99)
Potassium: 4.5 mmol/L (ref 3.5–5.1)
Sodium: 135 mmol/L (ref 135–145)

## 2014-04-19 LAB — CBC
HCT: 26.9 % — ABNORMAL LOW (ref 36.0–46.0)
HEMOGLOBIN: 8.6 g/dL — AB (ref 12.0–15.0)
MCH: 29.2 pg (ref 26.0–34.0)
MCHC: 32 g/dL (ref 30.0–36.0)
MCV: 91.2 fL (ref 78.0–100.0)
Platelets: 362 10*3/uL (ref 150–400)
RBC: 2.95 MIL/uL — AB (ref 3.87–5.11)
RDW: 15.1 % (ref 11.5–15.5)
WBC: 16.9 10*3/uL — ABNORMAL HIGH (ref 4.0–10.5)

## 2014-04-19 LAB — GLUCOSE, CAPILLARY
GLUCOSE-CAPILLARY: 112 mg/dL — AB (ref 70–99)
Glucose-Capillary: 176 mg/dL — ABNORMAL HIGH (ref 70–99)
Glucose-Capillary: 226 mg/dL — ABNORMAL HIGH (ref 70–99)
Glucose-Capillary: 251 mg/dL — ABNORMAL HIGH (ref 70–99)

## 2014-04-19 MED ORDER — IOHEXOL 300 MG/ML  SOLN
100.0000 mL | Freq: Once | INTRAMUSCULAR | Status: AC | PRN
  Administered 2014-04-19: 100 mL via INTRAVENOUS

## 2014-04-19 MED ORDER — POLYETHYLENE GLYCOL 3350 17 G PO PACK: 17.0000 g | PACK | Freq: Two times a day (BID) | ORAL | Status: DC | PRN

## 2014-04-19 MED ORDER — LORAZEPAM 2 MG/ML IJ SOLN
0.5000 mg | Freq: Four times a day (QID) | INTRAMUSCULAR | Status: DC | PRN
Start: 2014-04-19 — End: 2014-04-21
  Administered 2014-04-19: 0.5 mg via INTRAVENOUS
  Filled 2014-04-19: qty 1

## 2014-04-19 MED ORDER — LIP MEDEX EX OINT
1.0000 "application " | TOPICAL_OINTMENT | Freq: Two times a day (BID) | CUTANEOUS | Status: DC
  Administered 2014-04-20 – 2014-04-21 (×3): 1 via TOPICAL
  Filled 2014-04-19: qty 7

## 2014-04-19 MED ORDER — IOHEXOL 300 MG/ML  SOLN
25.0000 mL | INTRAMUSCULAR | Status: AC
  Administered 2014-04-19 (×2): 25 mL via ORAL

## 2014-04-19 MED ORDER — ALPRAZOLAM 0.25 MG PO TABS: 0.2500 mg | ORAL_TABLET | Freq: Two times a day (BID) | ORAL | Status: DC | PRN

## 2014-04-19 MED ORDER — PEG-KCL-NACL-NASULF-NA ASC-C 100 G PO SOLR
0.5000 | Freq: Once | ORAL | Status: AC
  Administered 2014-04-20: 100 g via ORAL
  Filled 2014-04-19: qty 1

## 2014-04-19 MED ORDER — PHENOL 1.4 % MT LIQD
2.0000 | OROMUCOSAL | Status: DC | PRN
  Filled 2014-04-19: qty 177

## 2014-04-19 MED ORDER — PEG-KCL-NACL-NASULF-NA ASC-C 100 G PO SOLR: 1.0000 | Freq: Once | ORAL | Status: DC

## 2014-04-19 MED ORDER — PEG-KCL-NACL-NASULF-NA ASC-C 100 G PO SOLR
0.5000 | Freq: Once | ORAL | Status: DC
  Filled 2014-04-19: qty 1

## 2014-04-19 MED ORDER — MENTHOL 3 MG MT LOZG
1.0000 | LOZENGE | OROMUCOSAL | Status: DC | PRN
  Filled 2014-04-19: qty 9

## 2014-04-19 MED ORDER — HYDROMORPHONE HCL 2 MG/ML IJ SOLN
2.0000 mg | INTRAMUSCULAR | Status: DC | PRN
  Administered 2014-04-19 – 2014-04-20 (×9): 2 mg via INTRAVENOUS
  Filled 2014-04-19 (×9): qty 1

## 2014-04-19 MED ORDER — MAGIC MOUTHWASH
15.0000 mL | Freq: Four times a day (QID) | ORAL | Status: DC | PRN
  Filled 2014-04-19: qty 15

## 2014-04-19 MED ORDER — BISACODYL 10 MG RE SUPP: 10.0000 mg | Freq: Two times a day (BID) | RECTAL | Status: DC | PRN

## 2014-04-19 MED ORDER — FUROSEMIDE 10 MG/ML IJ SOLN
40.0000 mg | Freq: Once | INTRAMUSCULAR | Status: AC
  Administered 2014-04-19: 40 mg via INTRAVENOUS
  Filled 2014-04-19: qty 4

## 2014-04-19 MED ORDER — VANCOMYCIN HCL 10 G IV SOLR
1250.0000 mg | INTRAVENOUS | Status: DC
  Administered 2014-04-19 – 2014-04-20 (×2): 1250 mg via INTRAVENOUS
  Filled 2014-04-19 (×2): qty 1250

## 2014-04-19 MED ORDER — SODIUM CHLORIDE 0.9 % IV SOLN
1.0000 g | Freq: Three times a day (TID) | INTRAVENOUS | Status: DC
  Administered 2014-04-19 – 2014-04-20 (×3): 1 g via INTRAVENOUS
  Filled 2014-04-19 (×4): qty 1

## 2014-04-19 MED ORDER — LACTATED RINGERS IV BOLUS (SEPSIS): 1000.0000 mL | Freq: Three times a day (TID) | INTRAVENOUS | Status: DC | PRN

## 2014-04-19 MED ORDER — DEXTROSE 5 % IV SOLN: 1.0000 g | Freq: Two times a day (BID) | INTRAVENOUS | Status: DC

## 2014-04-19 NOTE — Progress Notes (Addendum)
[facility] PROGRESS NOTE  Interval history: 64 year old female with history of nonalcoholic cirrhosis of liver, history of colostomy, recently than a complex pelvic mass (following with GYN as outpatient) admitted to the hospital with acute hepatic encephalopathy. She was started on IV hydration, lactulose and rifaximin following which the encephalopathy resolved hospital course prolonged due to development of right-sided colitis, high output from the stoma and dehydration.   Assessment/Plan: Hepatic encephalopathy: - Cont lactulose and rifaximin, her encephalopathy has resolved. - GI was consulted they added rifaximin. - Large amounts of stools output through stoma. - try to keep leanne daughter informed daily [phone].  Leukocytosis due to infectious colitis: - Started on IV  Flagyl and Cipro, CT abdomen and pelvis showed right sided colitis. Cont to have large amount of output through stoma. - repeat c. Dif. - Pain not controlled increase dilauidid, change antibiotic regimen to flagyl and meropenem. - repeat CT scna of abdomen and pelvis. - consult surgery.  Complex multi septated pelvic mass  - Vaginal ultrasound confirmed pelvic mass, She was recently evaluated by outpatient GYN Dr. Everitt Amber ( 04/11/2014) and thought this was likely an ovarian cyst and less likely to be a malignancy   Cirrhosis of liver not due to alcohol: - Most likely due to [place], CT scan of the abdomen and pelvis showed stable liver cirrhosis. - Continue low-sodium diet. Restrict fluids.  Essential hypertension: - No changes made to her regimen.  Type 2 diabetes, uncontrolled, with neuropathy - A1c of 9.6 blood glucose uncontrolled here in the hospital. - Increase long-acting insulin 25 units.   Hyponatremia: - Etiology prerenal, With large amounts of stool output through her stoma. - Held IV hydration start lasix.  Code Status: full Family Communication: none  Disposition Plan:  inpatient   Consultants:  GI  Procedures:  CT head  Antibiotics:  rifaximin  HPI/Subjective: Right lower quadrant abdominal pain not improved.  Objective: Filed Vitals:   04/18/14 0829 04/18/14 1458 04/18/14 2140 04/19/14 0426  BP:  140/45 130/40 134/42  Pulse:  60 64 64  Temp:  98.2 F (36.8 C) 98.4 F (36.9 C) 98.1 F (36.7 C)  TempSrc:  Oral Oral Oral  Resp:  20 18 18   Height:      Weight:    101.288 kg (223 lb 4.8 oz)  SpO2: 100% 100% 100% 99%    Intake/Output Summary (Last 24 hours) at 04/19/14 0824 Last data filed at 04/19/14 0411  Gross per 24 hour  Intake   1180 ml  Output   3225 ml  Net  -2045 ml   Filed Weights   04/17/14 0354 04/18/14 0538 04/19/14 0426  Weight: 101.152 kg (223 lb) 101.515 kg (223 lb 12.8 oz) 101.288 kg (223 lb 4.8 oz)    Exam:  General: Alert, awake, oriented x3, in no acute distress.  HEENT: No bruits, no goiter.  Heart: Regular rate and rhythm. Lungs: Good air movement, clear Abdomen: Soft, mild right lower quadrant tenderness., nondistended, positive bowel sounds.  Neuro: Grossly intact, nonfocal. Positive asterixis   Data Reviewed: Basic Metabolic Panel:  Recent Labs Lab 04/15/14 0528 04/16/14 0525 04/17/14 0542 04/18/14 0704 04/19/14 0420  NA 128* 129* 131* 128* 135  K 4.2 4.0 4.5 4.7 4.5  CL 91* 92* 98 99 102  CO2 26 27 23 22 24   GLUCOSE 268* 204* 230* 222* 204*  BUN 25* 22 23 27* 28*  CREATININE 1.45* 1.25* 1.19* 1.23* 1.22*  CALCIUM 9.9 9.5 9.2 9.3 9.7  Liver Function Tests:  Recent Labs Lab 04/13/14 0520 04/14/14 0600 04/15/14 0528 04/18/14 0704  AST 50* 40* 33 27  ALT 31 29 26 22   ALKPHOS 109 114 114 106  BILITOT 0.7 0.6 0.5 0.4  PROT 6.9 7.4 7.3 7.0  ALBUMIN 3.4* 3.6 3.6 3.3*   No results for input(s): LIPASE, AMYLASE in the last 168 hours.  Recent Labs Lab 04/12/14 1828 04/13/14 0645 04/13/14 1815 04/14/14 0600  AMMONIA 56* 77* 45* 40*   CBC:  Recent Labs Lab 04/13/14 0520  04/14/14 0600 04/15/14 0528 04/16/14 0525 04/17/14 0542 04/18/14 0704 04/19/14 0420  WBC 11.5* 14.0* 17.3* 17.2* 14.0* 17.6* 16.9*  NEUTROABS 4.5 7.2 9.6* 8.9* 7.1  --   --   HGB 8.5* 8.9* 9.1* 9.2* 8.5* 9.2* 8.6*  HCT 26.8* 28.7* 29.3* 28.5* 26.2* 29.0* 26.9*  MCV 91.8 92.0 91.6 90.8 91.3 91.2 91.2  PLT 360 373 415* 374 377 359 362   Cardiac Enzymes: No results for input(s): CKTOTAL, CKMB, CKMBINDEX, TROPONINI in the last 168 hours. BNP (last 3 results)  Recent Labs  03/04/14 2100 04/12/14 0710  BNP 35.8 311.9*    ProBNP (last 3 results)  Recent Labs  07/16/13 0650 09/02/13 1803 03/30/14 1230  PROBNP 933.6* 558.0* 71.0    CBG:  Recent Labs Lab 04/18/14 0727 04/18/14 1133 04/18/14 1625 04/18/14 2142 04/19/14 0728  GLUCAP 259* 286* 249* 269* 176*    Recent Results (from the past 240 hour(s))  Urine culture     Status: None   Collection Time: 04/12/14  2:02 AM  Result Value Ref Range Status   Specimen Description URINE, RANDOM  Final   Special Requests NONE  Final   Colony Count   Final    25,000 COLONIES/ML Performed at [facility]    Culture   Final    Multiple bacterial morphotypes present, none predominant. Suggest appropriate recollection if clinically indicated. Performed at [facility]    Report Status 04/13/2014 FINAL  Final  MRSA PCR Screening     Status: None   Collection Time: 04/12/14  3:44 AM  Result Value Ref Range Status   MRSA by PCR NEGATIVE NEGATIVE Final    Comment:        The GeneXpert MRSA Assay (FDA approved for NASAL specimens only), is one component of a comprehensive MRSA colonization surveillance program. It is not intended to diagnose MRSA infection nor to guide or monitor treatment for MRSA infections.   Clostridium Difficile by PCR     Status: None   Collection Time: 04/14/14 11:00 AM  Result Value Ref Range Status   C difficile by pcr NEGATIVE NEGATIVE Final  Clostridium Difficile by PCR      Status: None   Collection Time: 04/17/14  8:19 AM  Result Value Ref Range Status   C difficile by pcr NEGATIVE NEGATIVE Final     Studies: No results found.  Scheduled Meds: . atorvastatin  20 mg Oral q1800  . ceFEPime (MAXIPIME) IV  1 g Intravenous Q12H  . ciprofloxacin  400 mg Intravenous Q12H  . dicyclomine  10 mg Oral TID  . folic acid  1 mg Oral QPM  . furosemide  40 mg Oral BID  . heparin subcutaneous  5,000 Units Subcutaneous 3 times per day  . hydrALAZINE  37.5 mg Oral BID  . insulin aspart  0-15 Units Subcutaneous TID WC  . insulin aspart  0-5 Units Subcutaneous QHS  . insulin aspart  10 Units Subcutaneous  TID WC  . insulin detemir  30 Units Subcutaneous BID  . isosorbide dinitrate  20 mg Oral BID  . lactulose  10 g Oral Daily  . losartan  50 mg Oral Daily  . metoCLOPramide  10 mg Oral TID  . metronidazole  500 mg Intravenous Q8H  . mometasone-formoterol  2 puff Inhalation BID  . pantoprazole  80 mg Oral Q1200  . potassium chloride  10 mEq Oral Daily  . rifaximin  550 mg Oral BID  . sodium chloride  1,000 mL Intravenous Once  . sodium chloride  500 mL Intravenous Once  . sodium chloride  3 mL Intravenous Q12H   Continuous Infusions:     Charlynne Cousins  [facility] Pager [phone]. If 7PM-7AM, please contact night-coverage at [email or web address], password [facility] 04/19/2014, 8:24 AM  LOS: 7 days

## 2014-04-19 NOTE — Progress Notes (Addendum)
[facility] Progress Note  Subjective:    [name] is a 64 y.o. female with significant past medical history of multiple medical problems including nonalcoholic cirrhosis, cardiac arrythmia s/p pacemaker, diastolic CHF, s/p colostomy for perforation during colonosocpy, lung cancer (stage IB non-small cell lung cancer diagnosed in April 2004 status post left upper lobectomy by Dr. 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  She presented to [facility] hospital on 3/14 wiith encephalopathy/confusion and abd pain. Patient apparently has had progressively worsening confusion over the past 6 months, but much worse over the past week or so. Has been followed by the PCP. Is established with [facility], but has not been seen in our office since 10/2013. Apparently had an ammonia level performed as outpatient and was directed to come to the ED because it was elevated at 97.Presented to the ER afebrile, hemodynamically stable. White blood cell count 12.8, hemoglobin 9.2, creatinine 1.38, sodium 134. Repeat ammonia level 101. Head CT and KUB with no acute findings. Pt reports that she has had abd pain x 1 month, but feels it has been getting worse on the right side for the past week. She has been having increased output via ostomy. She is nauseous but has not vomited. Abd CT in Feb showed no definite acute abnormality in the abdomen or pelvis. She was noted to have increased size or right adnexal cystic structure, abd stable findings of cirrhosis and benign appearing pancreatic cystic lesions, and LLQ parastomal hernia.Repeat CT on 3/15 showed further enlargement of possible ovarian cysts despite a reported hx of right oophorectomy. Hydrosalpinx a possibility. Stable 2 mm right lower lobe nodule. Repeat CT on 3/18 showed a loculated cystic mass in the pelvis behind the bladder measuring 8x4 cm.  No enhancing soft tissue component.This couldbe loculated peritoneal fluid however cystic mass of the ovary is not excluded. There was also moderate thickening of the cecum and right colon suggestive of colitis, which was not on the prior study. Left colon not thickened. 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 Pts encephalopathy has resolved, but she is now having increased output through ostomy. C Diff negative.  Colonoscopy in February 2013 via her ostomy site showed mild inflammation of the mucosa of the ostomy stump, but was otherwise normal. Wireless capsule endoscopy in February 2013 showed a few tiny red spots throughout the small bowel, but no definite sign of gastrointestinal bleeding. She had another EGD in March 2014 at [facility] which revealed portal hypertensive gastropathy; it was indicated that this could be a source of bleeding. Then EGD in 10/2013 by Dr. [name] was normal. Pt currently on IV vanc and meropenem. On xifaxan for HE. On lactulose.  Objective:  Vital signs in last 24 hours: Temp:  [98.1 F (36.7 C)-98.4 F (36.9 C)] 98.1 F (36.7 C) (03/22 0426) Pulse Rate:  [60-64] 64 (03/22 0426) Resp:  [18-20] 18 (03/22 0426) BP: (130-140)/(40-45) 134/42 mmHg (03/22 0426) SpO2:  [98 %-100 %] 98 % (03/22 0840) Weight:  [223 lb 4.8 oz (101.288 kg)] 223 lb 4.8 oz (101.288 kg) (03/22 0426) Last BM Date: 04/19/14 General:   Alert,  Well-developed, oriented female   in NAD Heart:  Regular rate and rhythm; no murmurs Pulm;lungs clear Abdomen:  Soft, RLQ tenderness,nondistended. Normal bowel sounds, without guarding, and without rebound.  Ostomy  LLQ with liquid brown stool Extremities:  Without edema. Neurologic:  Alert and  oriented x4;  grossly normal neurologically. Psych:  Alert and cooperative. Normal mood and affect.  Intake/Output from previous day: 03/21 0701 - 03/22 0700 In: 1180  [P.O.:480; IV Piggyback:700] Out: 3225 [Urine:3100; Stool:125] Intake/Output this shift:  STUDIES: images for CT Abdomen Pelvis Wo Contrast     Study Result     CLINICAL DATA: Abdominal pain acute. Leukocytosis. History of cervical cancer and lung cancer.  EXAM: CT ABDOMEN AND PELVIS WITHOUT CONTRAST  TECHNIQUE: Multidetector CT imaging of the abdomen and pelvis was performed following the standard protocol without IV contrast.  COMPARISON: CT abdomen pelvis 01/26/2014, 03/04/2014  FINDINGS: Mild left lower lobe scarring or atelectasis. Lungs otherwise clear. Cardiac enlargement. Patient has a transvenous pacemaker.  The liver is enlarged. The liver has irregular contours consistent with cirrhosis. The spleen has been removed. No focal liver lesion. Gallbladder has been removed. Small hypodensity uncinate process of the pancreas better seen on the prior contrast-enhanced study. No evidence of pancreatitis.  The kidneys show no obstruction or mass. No renal calculi. Urinary bladder normal.  Postop hysterectomy. Loculated cystic mass in the pelvis behind the bladder measuring 8 x 4 cm. This has slowly grown since the prior study of 01/26/2014. No enhancing soft tissue component. This could be loculated peritoneal fluid however cystic mass of the ovary is not excluded and further imaging evaluation is warranted.  Moderate thickening of the cecum and right colon suggestive of colitis. This was not present on the prior study. The left colon is not thickened. Negative for bowel obstruction.  Ventral hernia left lower quadrant containing and ostomy is well as small bowel loops.  IMPRESSION: Cirrhosis of the liver  Enlarging loculated cystic mass in the pelvis behind the bladder measuring 8 x 4 cm. Abscess not considered likely given lack of soft tissue component. This could be loculated peritoneal fluid or a cystic mass of the ovary. Pelvic ultrasound  recommended for further evaluation  Moderate thickening of the cecum and right colon suggestive of colitis. This has developed since the prior study. Negative for bowel obstruction.   Electronically Signed  By: Franchot Gallo M.D.  On: 04/15/2014 14:17   CLINICAL DATA: Adnexal mass.  EXAM: TRANSABDOMINAL AND TRANSVAGINAL ULTRASOUND OF PELVIS  TECHNIQUE: Both transabdominal and transvaginal ultrasound examinations of the pelvis were performed. Transabdominal technique was performed for global imaging of the pelvis including uterus, ovaries, adnexal regions, and pelvic cul-de-sac.  COMPARISON: CT scans dated 04/15/2014 and 04/12/2014 and 03/04/2014 and 01/26/2014 and pelvic ultrasound dated 03/23/2014  FINDINGS: Uterus  Removed  Right ovary  Not visualized.  Left ovary  Not visualized.  Other findings  Complex right adnexal cystic mass measuring 9.2 x 7.8 x 4.4 cm, unchanged since the prior ultrasound exam.  No free fluid.  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   Electronically Signed  By: Lorriane Shire M.D.  On: 04/16/2014 08:36  Lab Results:  Recent Labs  04/17/14 0542 04/18/14 0704 04/19/14 0420  WBC 14.0* 17.6* 16.9*  HGB 8.5* 9.2* 8.6*  HCT 26.2* 29.0* 26.9*  PLT 377 359 362   BMET  Recent Labs  04/17/14 0542 04/18/14 0704 04/19/14 0420  NA 131* 128* 135  K 4.5 4.7 4.5  CL 98 99 102  CO2 23 22 24   GLUCOSE 230* 222* 204*  BUN 23 27* 28*  CREATININE 1.19* 1.23* 1.22*  CALCIUM 9.2 9.3 9.7   LFT  Recent Labs  04/18/14 0704  PROT 7.0  ALBUMIN 3.3*  AST 27  ALT 22  ALKPHOS 106  BILITOT 0.4   04/17/14 C Diff Negative  ASSESSMENT/PLAN:   -NASH cirrhosis with HE, which appears to be a new issue but she says that she thinks she was on xifaxan in the past. Ammonia elevated at 101 with confusion on admission,  most recent ammonia on 3/17 was 40. Improved with lactulose. -Chronic bleeding via ostomy: Has been evaluated extensively in the past. No overt bleeding currently. -Abd pain. Appears to be a chronic problem but has been worse the past week, now associated with diarrhea and colitis on CT. C diff neg. Add florastor. Will review with Dr Ardis Hughs re ? Colonoscopy via ostomy.Pt sya she does not want a colonoscopy unless " I can be totally out because I woke up during my last colonoscopy". Will hold lactulose for now.   LOS: 7 days   Hvozdovic, Deloris Ping 04/19/2014, Pager [phone]  Add- reviewed with Dr Ardis Hughs. Will plan on colonoscopy tomorrow.   ________________________________________________________________________  [facility] MD note:  I personally examined the patient, reviewed the data and agree with the assessment and plan described above.  She's had increase in stool output, pain, elevated WBC, abnormal right colon on CT.  Unclear etiology. C. Diff has been negative twice now so that is unlikely.  Her HE has cleared up, perhaps lactulose (which was new for her this admit I believe) has been contributing to the loose stools. She actually says today her stools are back to normal.  Has chronic abd pains, probably worse now.  Will plan on colonoscopy via ostomy tomorrow with MAC sedation to evaluated right colon.  Not sure she needs merepenum however empiric oral vanc for potential C. Diff is still reasonable given elevating WBC, abnormal R colon on CT.   Owens Loffler, MD [facility] Pager [phone]

## 2014-04-19 NOTE — Progress Notes (Signed)
ANTIBIOTIC CONSULT NOTE - INITIAL  Pharmacy Consult for Vancomycin Indication: Intra-abdominal infection  Allergies  Allergen Reactions  . Acetaminophen Other (See Comments)    Cirrhosis of liver  . Morphine Other (See Comments)    REACTION: Lowers BP  . Morphine And Related Other (See Comments)    Blood pressure drops   . Other Other (See Comments)    AGENT:  Per pt, CANNOT TAKE ANY FORM OF BLOOD THINNER, due to cirrhosis of the liver  . Penicillins Anaphylaxis and Rash  . Trazodone And Nefazodone Other (See Comments)    Cardiac arrythmia - DO NOT USE  . Codeine Phosphate Other (See Comments)    REACTION: Stomach cramps  . Hydrocodone-Acetaminophen Other (See Comments)    REACTION: hallucinations  . Cephalexin Swelling and Rash  . Hydrocodone-Acetaminophen Other (See Comments)    unknown    Patient Measurements: Height: 5' (152.4 cm) Weight: 223 lb 4.8 oz (101.288 kg) IBW/kg (Calculated) : 45.5  Vital Signs: Temp: 98.1 F (36.7 C) (03/22 0426) Temp Source: Oral (03/22 0426) BP: 134/42 mmHg (03/22 0426) Pulse Rate: 64 (03/22 0426) Intake/Output from previous day: 03/21 0701 - 03/22 0700 In: 1180 [P.O.:480; IV Piggyback:700] Out: 3225 [Urine:3100; Stool:125]  Labs:  Recent Labs  04/17/14 0542 04/18/14 0704 04/19/14 0420  WBC 14.0* 17.6* 16.9*  HGB 8.5* 9.2* 8.6*  PLT 377 359 362  CREATININE 1.19* 1.23* 1.22*   Estimated Creatinine Clearance: 51.2 mL/min (by C-G formula based on Cr of 1.22). No results for input(s): VANCOTROUGH, VANCOPEAK, VANCORANDOM, GENTTROUGH, GENTPEAK, GENTRANDOM, TOBRATROUGH, TOBRAPEAK, TOBRARND, AMIKACINPEAK, AMIKACINTROU, AMIKACIN in the last 72 hours.    Assessment: 70 yoF admitted 3/14 with encephalopathy and abdominal pain.  Extensive PMH includes nonalcoholic cirrhosis, arrythmia s/p pacemaker, diastolic CHF, colostomy, lung cancer s/p lobectomy, COPD, thrombocytopenia and anemia, and GIB. She was initially started on cipro/flagyl  for suspected infectious colitis.  Cdiff has been negative, but she continues to have large amounts of stool output through stoma.  Antibiotics are broadened today; MD to dose meropenem (d/t PCN and Ceph allergies) and Pharmacy is consulted to dose Vancomycin.  Anti-infectives: 3/15 >> Rifaximin >> 3/15 >> Cipro >> 3/22 3/17 >> Flagyl >> 3/22 3/22 >> Vanc >>   3/22 >> Meropenem >>    Micro: 3/15 MRSA: neg 3/15 urine: 25k, Multiple morphotypes, none predominant 3/17 Cdiff: negative 3/20 Cdiff: negative   Today, 04/19/2014:  Day # 8 cipro and Day # 6 Flagyl, now Day #1 Vanc/Meropenem  Tmax: afebrile  WBCs: remain elevated, 16.9  Renal: SCr 1.22, CrCl ~ 51 ml/min   Goal of Therapy:  Vancomycin trough level 15-20 mcg/ml Appropriate abx dosing, eradication of infection.  Plan:   Vancomycin 1250 mg IV q24h.  Measure Vanc trough at steady state.  Follow up renal fxn, culture results, and clinical course.   Gretta Arab PharmD, BCPS Pager [phone] 04/19/2014 10:07 AM

## 2014-04-19 NOTE — Consult Note (Signed)
Reason for Consult: abdominal pain, colitis Referring Physician: Dr. Sandi Mariscal   HPI: [name] is a 64 year old female with a complicated PMH of COPD, perforated colon following a colonoscopy requiring a Hartmann resection of sigmoid colon and end colostomy by Dr. Deon Pilling 11/06/2005, NASH, cardiac arrhythmia s/p pacemaker, dCHF, lung cancer s/p LUL lobectomy, home 02, thrombocytopenia, anemia, diabetes mellitus, splenomegaly 01/2013, admitted on 04/11/14 with encephalopathy.  CT of A/P did not show colitis.  A repeat CT of A/P on 04/15/14 moderate thickening of the cecum and right colon suggestive of colitis and enlarging loculated pelvic mass.  The patient was start on cipro/flayl and initially improved.  According to primary team, she acutely worsened yesterday and therefore we have been asked to evaluate for further evaluation.  The patient reports chronic abdominal pain x3 years.  Location is RUQ and RLQ.  No aggravating or alleviating factors.  She has had a cholecystectomy, appendectomy and hysterectomy.  Over the past few months, she reports increased pain, but not much different in the last week.  She reports "low grade fever, 99.2."  She denies chills or sweats.  She reports a poor appetite, but has not had any weight loss.  She reports intermittent bleeding through her stoma, which at times she reports "large clots" and other times frank bleeding.  She reports nausea, but no vomiting.  She reports increased ostomy output which she attributes to lactulose.   C Diff PCR is negative.  FOBT positive x1.  WBC is down today at 16.9.  Primaxin was started on GI has been following and recommends a colonoscopy.    Off note, she was seen by Dr. Everitt Amber on 04/11/14 for a ovarian mass which she felt was benign appearing, however, the only way to diagnose would be through excision is by excision.  She felt that the patient was not a surgical candidate.  Past Medical History  Diagnosis Date  .  Cirrhosis   . GERD (gastroesophageal reflux disease)   . Cervical disc syndrome     trouble turnng neck at times  . Chronic respiratory failure   . Diverticulitis of colon   . Perforation of colon   . IBS (irritable bowel syndrome)   . Chronic lower GI bleeding   . Overactive bladder   . Thrombocytopenia     sees Dr. Julien Nordmann   . Splenomegaly   . Depression   . Hypertension   . Asthma   . Heart murmur   . Hyperlipidemia   . COPD (chronic obstructive pulmonary disease)     sees Dr. Gwenette Greet   . Colostomy care   . Hypercalcemia   . CHF (congestive heart failure)   . Pacemaker 12/14/2013  . 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 Cervical cancer many years ago  . Complication of anesthesia     woke up during colonscopy and endoscopy in past  . History of blood transfusion "several"    "bleeding via ostomy" (01/04/2014)  . On home oxygen therapy     "2L; 24/7" (01/04/2014)  . Pneumonia "1-2 times"  . Chronic bronchitis "several times"  . Sleep apnea 2012    mild, no cpap needed  . Type II diabetes mellitus     sees Dr. Cruzita Lederer   . Chronic disease anemia     sees Dr. Julien Nordmann, due to chronic disease and GI losses   . History of stomach ulcers   . Migraine     "  last one was several years ago" (01/04/2014)  . Arthritis     "tailbone; hands; legs" (01/04/2014)  . Anxiety   . Pericardial effusion 2007, 2015.   [name] Chronic abdominal pain     IBS  . Cirrhosis of liver     stage 4    Past Surgical History  Procedure Laterality Date  . Colostomy  11/06/2005  . Lung removal, partial Left 2004    upper lobe removed  . Left colectomy  11/06/2005    Hartmann resection of sigmoid colon and end colostomy.  . Esophagogastroduodenoscopy  03/19/2011    Procedure: ESOPHAGOGASTRODUODENOSCOPY (EGD);  Surgeon: [name], MD;  Location: [facility];  Service: Endoscopy;  Laterality: N/A;  . Colonoscopy  03/19/2011    Procedure: COLONOSCOPY;  Surgeon: [name], MD;  Location: [facility];  Service: Endoscopy;  Laterality: N/A;  . Givens capsule study  03/20/2011    Procedure: GIVENS CAPSULE STUDY;  Surgeon: [name], MD;  Location: [facility];  Service: Endoscopy;  Laterality: N/A;  . Small bowel obstruction repair  March 2012  . Umbilical hernia repair  March 2012  . Splenectomy, total N/A 02/24/2013    Procedure: SPLENECTOMY;  Surgeon: Harl Bowie, MD;  Location: [place];  Service: General;  Laterality: N/A;  . Orif patella Left 04/21/2013    Procedure: OPEN REDUCTION INTERNAL (ORIF) FIXATION PATELLA;  Surgeon: Augustin Schooling, MD;  Location: [place];  Service: Orthopedics;  Laterality: Left;  . Pacemaker insertion  2015  . Esophagogastroduodenoscopy (egd) with propofol N/A 11/02/2013    Procedure: ESOPHAGOGASTRODUODENOSCOPY (EGD) WITH PROPOFOL;  Surgeon: [name], MD;  Location: [facility];  Service: Endoscopy;  Laterality: N/A;  EGD with APC  . Hot hemostasis N/A 11/02/2013    Procedure: HOT HEMOSTASIS (ARGON PLASMA COAGULATION/BICAP);  Surgeon: [name], MD;  Location: [facility];  Service: Endoscopy;  Laterality: N/A;  . Colon surgery    . Hernia repair    . Fracture surgery    . Abdominal hysterectomy    . Tubal ligation    . Tonsillectomy  1959  . Appendectomy  1962  . Cholecystectomy    . Cardiac catheterization  1967  . Permanent pacemaker insertion N/A 09/07/2013    Procedure: PERMANENT PACEMAKER INSERTION;  Surgeon: Evans Lance, MD;  Location: [facility];  Service: Cardiovascular;  Laterality: N/A;  . Tee without cardioversion N/A 01/06/2014    Procedure: TRANSESOPHAGEAL ECHOCARDIOGRAM (TEE);  Surgeon: Lelon Perla, MD;  Location: [facility] ENDOSCOPY;  Service: Cardiovascular;  Laterality: N/A;  . Colostomy  11/06/2005    OPERATIVE REPORT    Family History  Problem Relation Age of Onset  . Coronary artery disease    . Diabetes type II    . Heart attack Mother   . Diabetes type II Mother   .  Cirrhosis Father   . Heart attack Sister   . Pancreatic cancer Mother     secondary from surgery  . Cervical cancer Maternal Grandmother   . Hypertension Father   . Hypertension Sister   . Hypertension Son   . Hypertension Daughter   . Stroke Neg Hx     Social History:  reports that she quit smoking about 12 years ago. Her smoking use included Cigarettes. She has a 70 pack-year smoking history. She has never used smokeless tobacco. She reports that she does not drink alcohol or use illicit drugs.  Allergies:  Allergies  Allergen Reactions  . Acetaminophen  Other (See Comments)    Cirrhosis of liver  . Morphine Other (See Comments)    REACTION: Lowers BP  . Morphine And Related Other (See Comments)    Blood pressure drops   . Other Other (See Comments)    AGENT:  Per pt, CANNOT TAKE ANY FORM OF BLOOD THINNER, due to cirrhosis of the liver  . Penicillins Anaphylaxis and Rash  . Trazodone And Nefazodone Other (See Comments)    Cardiac arrythmia - DO NOT USE  . Codeine Phosphate Other (See Comments)    REACTION: Stomach cramps  . Hydrocodone-Acetaminophen Other (See Comments)    REACTION: hallucinations  . Cephalexin Swelling and Rash  . Hydrocodone-Acetaminophen Other (See Comments)    unknown    Medications:  Scheduled Meds: . atorvastatin  20 mg Oral q1800  . dicyclomine  10 mg Oral TID  . folic acid  1 mg Oral QPM  . furosemide  40 mg Oral BID  . heparin subcutaneous  5,000 Units Subcutaneous 3 times per day  . hydrALAZINE  37.5 mg Oral BID  . insulin aspart  0-15 Units Subcutaneous TID WC  . insulin aspart  0-5 Units Subcutaneous QHS  . insulin aspart  10 Units Subcutaneous TID WC  . insulin detemir  30 Units Subcutaneous BID  . iohexol  25 mL Oral Q1 Hr x 2  . isosorbide dinitrate  20 mg Oral BID  . losartan  50 mg Oral Daily  . meropenem (MERREM) IV  1 g Intravenous Q8H  . metoCLOPramide  10 mg Oral TID  . mometasone-formoterol  2 puff Inhalation BID  .  pantoprazole  80 mg Oral Q1200  . peg 3350 powder  0.5 kit Oral Once   And  . [START ON 04/20/2014] peg 3350 powder  0.5 kit Oral Once  . potassium chloride  10 mEq Oral Daily  . rifaximin  550 mg Oral BID  . sodium chloride  1,000 mL Intravenous Once  . sodium chloride  500 mL Intravenous Once  . sodium chloride  3 mL Intravenous Q12H  . vancomycin  1,250 mg Intravenous Q24H   Continuous Infusions:  PRN Meds:.albuterol, HYDROmorphone (DILAUDID) injection, ipratropium-albuterol, ondansetron **OR** ondansetron (ZOFRAN) IV, oxyCODONE, promethazine   Results for orders placed or performed during the hospital encounter of 04/11/14 (from the past 48 hour(s))  Glucose, capillary     Status: Abnormal   Collection Time: 04/17/14 11:34 AM  Result Value Ref Range   Glucose-Capillary 240 (H) 70 - 99 mg/dL  Occult blood card to lab, stool     Status: Abnormal   Collection Time: 04/17/14  4:22 PM  Result Value Ref Range   Fecal Occult Bld POSITIVE (A) NEGATIVE  Glucose, capillary     Status: Abnormal   Collection Time: 04/17/14  4:38 PM  Result Value Ref Range   Glucose-Capillary 272 (H) 70 - 99 mg/dL  Glucose, capillary     Status: Abnormal   Collection Time: 04/17/14  9:07 PM  Result Value Ref Range   Glucose-Capillary 262 (H) 70 - 99 mg/dL  CBC     Status: Abnormal   Collection Time: 04/18/14  7:04 AM  Result Value Ref Range   WBC 17.6 (H) 4.0 - 10.5 K/uL    Comment: REPEATED TO VERIFY WHITE COUNT CONFIRMED ON SMEAR    RBC 3.18 (L) 3.87 - 5.11 MIL/uL   Hemoglobin 9.2 (L) 12.0 - 15.0 g/dL   HCT 29.0 (L) 36.0 - 46.0 %   MCV 91.2  78.0 - 100.0 fL   MCH 28.9 26.0 - 34.0 pg   MCHC 31.7 30.0 - 36.0 g/dL   RDW 14.8 11.5 - 15.5 %   Platelets 359 150 - 400 K/uL    Comment: SPECIMEN CHECKED FOR CLOTS REPEATED TO VERIFY PLATELET COUNT CONFIRMED BY SMEAR LARGE PLATELETS PRESENT   Comprehensive metabolic panel     Status: Abnormal   Collection Time: 04/18/14  7:04 AM  Result Value Ref  Range   Sodium 128 (L) 135 - 145 mmol/L   Potassium 4.7 3.5 - 5.1 mmol/L   Chloride 99 96 - 112 mmol/L   CO2 22 19 - 32 mmol/L   Glucose, Bld 222 (H) 70 - 99 mg/dL   BUN 27 (H) 6 - 23 mg/dL   Creatinine, Ser 1.23 (H) 0.50 - 1.10 mg/dL   Calcium 9.3 8.4 - 10.5 mg/dL   Total Protein 7.0 6.0 - 8.3 g/dL   Albumin 3.3 (L) 3.5 - 5.2 g/dL   AST 27 0 - 37 U/L   ALT 22 0 - 35 U/L   Alkaline Phosphatase 106 39 - 117 U/L   Total Bilirubin 0.4 0.3 - 1.2 mg/dL   GFR calc non Af Amer 46 (L) >90 mL/min   GFR calc Af Amer 53 (L) >90 mL/min    Comment: (NOTE) The eGFR has been calculated using the CKD EPI equation. This calculation has not been validated in all clinical situations. eGFR's persistently <90 mL/min signify possible Chronic Kidney Disease.    Anion gap 7 5 - 15  Glucose, capillary     Status: Abnormal   Collection Time: 04/18/14  7:27 AM  Result Value Ref Range   Glucose-Capillary 259 (H) 70 - 99 mg/dL  Glucose, capillary     Status: Abnormal   Collection Time: 04/18/14 11:33 AM  Result Value Ref Range   Glucose-Capillary 286 (H) 70 - 99 mg/dL  Glucose, capillary     Status: Abnormal   Collection Time: 04/18/14  4:25 PM  Result Value Ref Range   Glucose-Capillary 249 (H) 70 - 99 mg/dL  Glucose, capillary     Status: Abnormal   Collection Time: 04/18/14  9:42 PM  Result Value Ref Range   Glucose-Capillary 269 (H) 70 - 99 mg/dL   Comment 1 Notify RN    Comment 2 Document in Chart   CBC     Status: Abnormal   Collection Time: 04/19/14  4:20 AM  Result Value Ref Range   WBC 16.9 (H) 4.0 - 10.5 K/uL    Comment: WHITE COUNT CONFIRMED ON SMEAR   RBC 2.95 (L) 3.87 - 5.11 MIL/uL   Hemoglobin 8.6 (L) 12.0 - 15.0 g/dL   HCT 26.9 (L) 36.0 - 46.0 %   MCV 91.2 78.0 - 100.0 fL   MCH 29.2 26.0 - 34.0 pg   MCHC 32.0 30.0 - 36.0 g/dL   RDW 15.1 11.5 - 15.5 %   Platelets 362 150 - 400 K/uL    Comment: PLATELET COUNT CONFIRMED BY SMEAR  Basic metabolic panel     Status: Abnormal    Collection Time: 04/19/14  4:20 AM  Result Value Ref Range   Sodium 135 135 - 145 mmol/L    Comment: DELTA CHECK NOTED REPEATED TO VERIFY    Potassium 4.5 3.5 - 5.1 mmol/L   Chloride 102 96 - 112 mmol/L   CO2 24 19 - 32 mmol/L   Glucose, Bld 204 (H) 70 - 99 mg/dL   BUN 28 (  H) 6 - 23 mg/dL   Creatinine, Ser 1.22 (H) 0.50 - 1.10 mg/dL   Calcium 9.7 8.4 - 10.5 mg/dL   GFR calc non Af Amer 46 (L) >90 mL/min   GFR calc Af Amer 54 (L) >90 mL/min    Comment: (NOTE) The eGFR has been calculated using the CKD EPI equation. This calculation has not been validated in all clinical situations. eGFR's persistently <90 mL/min signify possible Chronic Kidney Disease.    Anion gap 9 5 - 15  Glucose, capillary     Status: Abnormal   Collection Time: 04/19/14  7:28 AM  Result Value Ref Range   Glucose-Capillary 176 (H) 70 - 99 mg/dL    No results found.  Review of Systems  All other systems reviewed and are negative.  Blood pressure 134/42, pulse 64, temperature 98.1 F (36.7 C), temperature source Oral, resp. rate 18, height 5' (1.524 m), weight 101.288 kg (223 lb 4.8 oz), SpO2 98 %. Physical Exam  Constitutional: She is oriented to person, place, and time. She appears well-developed and well-nourished. No distress.  Cardiovascular: Normal rate, regular rhythm and intact distal pulses.  Exam reveals no gallop and no friction rub.   No murmur heard. Respiratory: Effort normal and breath sounds normal. No respiratory distress. She has no wheezes. She has no rales. She exhibits no tenderness.  GI: Soft. Bowel sounds are normal. She exhibits no distension.  Parastomal hernia, soft, tender.  Stoma is pink and viable, functional.  She exhibits general tenderness without peritonitis.  No focal tenderness.   Musculoskeletal:  +2 BLE pitting edema  Neurological: She is alert and oriented to person, place, and time.  Skin: Skin is warm and dry. No rash noted. She is not diaphoretic. No erythema. No  pallor.  Psychiatric: She has a normal mood and affect. Her behavior is normal. Judgment and thought content normal.    Assessment/Plan: Acute on chronic abdominal pain Colitis -recommend a repeat CT scan -agree with antibiotics -agree with colonoscopy -would allow clears only -does not appear toxic, non surgical abdomen.  Will follow along Parastomal hernia -no obstruction -not a surgical candidate Pelvic mass -followed by Dr. Denman George.  Recommended [place] in 2 months NASH/cirrhosis DM COPD Encephalopathy  HTN  Pauleen Goleman, [place] ANP-BC 04/19/2014, 11:20 AM

## 2014-04-20 ENCOUNTER — Encounter (HOSPITAL_COMMUNITY)
Admission: EM | Disposition: A | Discharge status: Home Health | Payer: Self-pay | Source: Home / Self Care | Attending: Internal Medicine

## 2014-04-20 ENCOUNTER — Inpatient Hospital Stay (HOSPITAL_COMMUNITY): Payer: Medicare Other

## 2014-04-20 DIAGNOSIS — K5669 Other intestinal obstruction: Secondary | ICD-10-CM

## 2014-04-20 DIAGNOSIS — K56600 Partial intestinal obstruction, unspecified as to cause: Secondary | ICD-10-CM | POA: Diagnosis not present

## 2014-04-20 DIAGNOSIS — R1084 Generalized abdominal pain: Secondary | ICD-10-CM | POA: Diagnosis present

## 2014-04-20 DIAGNOSIS — D72829 Elevated white blood cell count, unspecified: Secondary | ICD-10-CM

## 2014-04-20 LAB — BASIC METABOLIC PANEL
Anion gap: 12 (ref 5–15)
BUN: 27 mg/dL — ABNORMAL HIGH (ref 6–23)
CO2: 25 mmol/L (ref 19–32)
Calcium: 9.9 mg/dL (ref 8.4–10.5)
Chloride: 100 mmol/L (ref 96–112)
Creatinine, Ser: 1.17 mg/dL — ABNORMAL HIGH (ref 0.50–1.10)
GFR calc Af Amer: 57 mL/min — ABNORMAL LOW (ref >90)
GFR calc non Af Amer: 49 mL/min — ABNORMAL LOW (ref >90)
GLUCOSE: 146 mg/dL — AB (ref 70–99)
Potassium: 4.4 mmol/L (ref 3.5–5.1)
Sodium: 137 mmol/L (ref 135–145)

## 2014-04-20 LAB — GLUCOSE, CAPILLARY
GLUCOSE-CAPILLARY: 144 mg/dL — AB (ref 70–99)
GLUCOSE-CAPILLARY: 121 mg/dL — AB (ref 70–99)
Glucose-Capillary: 214 mg/dL — ABNORMAL HIGH (ref 70–99)
Glucose-Capillary: 150 mg/dL — ABNORMAL HIGH (ref 70–99)

## 2014-04-20 LAB — CBC
HEMATOCRIT: 30.2 % — AB (ref 36.0–46.0)
Hemoglobin: 9.5 g/dL — ABNORMAL LOW (ref 12.0–15.0)
MCH: 28.6 pg (ref 26.0–34.0)
MCHC: 31.5 g/dL (ref 30.0–36.0)
MCV: 91 fL (ref 78.0–100.0)
PLATELETS: 390 10*3/uL (ref 150–400)
RBC: 3.32 MIL/uL — ABNORMAL LOW (ref 3.87–5.11)
RDW: 15.2 % (ref 11.5–15.5)
WBC: 17.5 10*3/uL — ABNORMAL HIGH (ref 4.0–10.5)

## 2014-04-20 SURGERY — COLONOSCOPY WITH PROPOFOL
Anesthesia: Monitor Anesthesia Care

## 2014-04-20 MED ORDER — SACCHAROMYCES BOULARDII 250 MG PO CAPS
250.0000 mg | ORAL_CAPSULE | Freq: Two times a day (BID) | ORAL | Status: DC
  Administered 2014-04-20 – 2014-04-21 (×3): 250 mg via ORAL
  Filled 2014-04-20 (×3): qty 1

## 2014-04-20 MED ORDER — SODIUM CHLORIDE 0.9 % IV SOLN
INTRAVENOUS | Status: DC
  Administered 2014-04-20: 04:00:00 via INTRAVENOUS

## 2014-04-20 NOTE — Progress Notes (Signed)
Progress Note   Subjective  Having large amounts of liquid brown ostomy output now. No abdominal pain.  Wants to eat. No nausea   Objective   Vital signs in last 24 hours: Temp:  [97.6 F (36.4 C)-98.4 F (36.9 C)] 98.2 F (36.8 C) (03/23 0429) Pulse Rate:  [62-68] 65 (03/23 0429) Resp:  [18] 18 (03/23 0429) BP: (133-162)/(42-58) 140/58 mmHg (03/23 0429) SpO2:  [96 %-100 %] 99 % (03/23 0429) Weight:  [224 lb 4.8 oz (101.742 kg)] 224 lb 4.8 oz (101.742 kg) (03/23 0429) Last BM Date: 04/19/14 General:    white female in NAD Abdomen:  Soft, nontender and nondistended. Normal bowel sounds. Extremities:  Without edema. Neurologic:  Alert and oriented,  grossly normal neurologically. Psych:  Cooperative. Normal mood and affect.  Intake/Output from previous day: 03/22 0701 - 03/23 0700 In: 2595 [P.O.:2240; I.V.:55; IV Piggyback:300] Out: 4100 [Urine:3500; Stool:600] Intake/Output this shift: Total I/O In: 0  Out: 725 [Urine:200; Stool:525]  Lab Results:  Recent Labs  04/18/14 0704 04/19/14 0420 04/20/14 0536  WBC 17.6* 16.9* 17.5*  HGB 9.2* 8.6* 9.5*  HCT 29.0* 26.9* 30.2*  PLT 359 362 390   BMET  Recent Labs  04/18/14 0704 04/19/14 0420 04/20/14 0536  NA 128* 135 137  K 4.7 4.5 4.4  CL 99 102 100  CO2 22 24 25   GLUCOSE 222* 204* 146*  BUN 27* 28* 27*  CREATININE 1.23* 1.22* 1.17*  CALCIUM 9.3 9.7 9.9   LFT  Recent Labs  04/18/14 0704  PROT 7.0  ALBUMIN 3.3*  AST 27  ALT 22  ALKPHOS 106  BILITOT 0.4   Studies/Results: Ct Abdomen Pelvis W Contrast  04/19/2014   CLINICAL DATA:  Colitis eval for perforated colon Cirrhosis, hernia repair, splecectomy, hyst GB appy, LUL removed. Lung ca dx 2004. Cervical ca. Known pelvic mass.  EXAM: CT ABDOMEN AND PELVIS WITH CONTRAST  TECHNIQUE: Multidetector CT imaging of the abdomen and pelvis was performed using the standard protocol following bolus administration of intravenous contrast.  CONTRAST:  111mL  OMNIPAQUE IOHEXOL 300 MG/ML  SOLN  COMPARISON:  04/15/2014, 04/12/2014 and 03/04/2014  FINDINGS: Lung bases are within normal. Pacer leads are present over the heart which is mildly enlarged and unchanged.  Abdominal images demonstrate evidence of a previous cholecystectomy and splenectomy. Mild nodular contour of the liver compatible with history of cirrhosis. Stable 1 cm cystic focus over the pancreatic head and stable 9 mm cystic focus over the pancreatic body. Adrenal glands are unremarkable. Appendix is not seen. There is calcified plaque over the abdominal aorta and iliac arteries.  Kidneys are normal in size without significant hydronephrosis or nephrolithiasis. The ureters are unremarkable.  There are 2 small stable hernias over the epigastric region with the larger just left of midline containing a small amount of fluid. There are postsurgical changes of the abdominal wall with diastases of the rectus abdominus muscles unchanged. Colostomy over the left lower quadrant is unchanged.  There are several contrast filled dilated small bowel loops which can be followed to the anterior midline of the mid abdomen as it is difficult to follow the small bowel beyond this point as contrast is not present distal to this point. The colostomy defect in the left lower quadrant does contained a few loops of small bowel. There may be developing small bowel obstruction due to adhesions over the anterior mid abdomen versus related to the colostomy site. No free peritoneal air. There has been interval resolution  of the previously noted moderate wall thickening involving the cecum and ascending colon.  There is subcutaneous edema over the abdominal wall over the lower abdomen/pelvis. No change in patient's known pelvic mass thought to be ovarian origin. Evidence of a Hartmann's pouch. Bladder is unremarkable. Remainder the exam is unchanged.  IMPRESSION: Resolution of previously noted significant wall thickening of the  ascending colon/cecum. New dilated small bowel loops measuring up to 5 cm in diameter which can be followed to the anterior midline of the mid abdomen as it is difficult to follow the small bowel beyond this point and no significant contrast seen distal to this point. This likely represents developing small-bowel obstruction which may be due to adhesions over the anterior midline of the mid abdomen although may be related to patient's colostomy which contains several small bowel loops herniating through the colostomy defect.  Stable known pelvic mass thought to be ovarian in origin as described on previous CT and ultrasound.  Two small stable pancreatic cystic foci. Recommend followup MRI one year. This recommendation follows ACR consensus guidelines: Managing Incidental Findings on Abdominal CT: White Paper of the ACR Incidental Findings Committee. J Am Coll Radiol 2010;7:754-773  Multiple other chronic stable findings as described.  These results will be called to the ordering clinician or representative by the Radiologist Assistant, and communication documented in the PACS or zVision Dashboard.   Electronically Signed   By: Marin Olp M.D.   On: 04/19/2014 17:03       Assessment / Plan:   5. 64 year old female with diarrhea and colitis on non-contrast CTscan 04/15/14. We had scheduled her for a colonoscopy today but cancelled secondary to inadequate prep. After our team saw patient yesterday she was seen by surgery who ordered repeat CTscan WITH contrast. A partial SBO was seen and right sided colitis was no longer present. Not sure if she had infectious process which resolved or if "colitis" was never really present. Given CTscan findings we don't feel colonoscopy is warranted. As far as pSBO, she is poor operative candidate, surgery following. Plan is for mobilization, bowel regimen. Of note, she is having copious amounts of ostomy output now (from bowel prep)  2. Hepatic encephalopathy, resolved.  Lactulose stopped because of diarrhea, now on xifaxan.   3. Leukocytosis, WBC 17. Etiology?     LOS: 8 days   Tye Savoy  04/20/2014, 9:36 AM   ________________________________________________________________________  Velora Heckler GI MD note:  I personally examined the patient, reviewed the data and agree with the assessment and plan described above.  I'm surprised that she continued to prep for colonoscopy despite Ct suggesting PSBO last night.  She had a lot of nausea, discomfort but managed to keep the prep down with good otomy output starting this AM.  The 'colitis' suggested on 3/18 CT has resolved and I do not plan to perform colonoscopy at this point.  She still has abd pain, always has abdominal pains.  They are probably worse than usual, I suspect from recent acute illness, resolving pSBO. She is written for her usual chronic narcotics already (oxy 20mg  every 3 hours as needed) and also some breakthough IV dilaudid. Would try to wean her off the OV in next 24 hours or so. She needs to get up and around, ambulate as best as possible.  Would change her abx to PO only (currently on vancomycin IV).    Will follow along.     Owens Loffler, MD [facility] Pager [phone]

## 2014-04-20 NOTE — Progress Notes (Addendum)
[facility] PROGRESS NOTE  [name] TGY:[id number] DOB: 1950-12-19 DOA: 04/11/2014 PCP: [name], MD  brief narrative 64 year old female with history of nonalcoholic cirrhosis of liver, history of colostomy, recently than a complex pelvic mass (following with GYN as outpatient) admitted to the hospital with acute hepatic encephalopathy. She was started on IV hydration, lactulose and rifaximin following which the encephalopathy resolved hospital course prolonged due to development of right-sided colitis, high output from the stoma and dehydration.   Assessment/Plan: Acute infectious colitis with high stomal output Patient was placed on empiric IV Cipro and Flagyl. continues to have large stomal output. (Possibly with bowel prep over the past 24 hours) . Repeat C. difficile negative. Has unexplained persistent leucocytosis. Remains afebrile. Repeat abdominal CT done which shows resolution of the colitis previously but now shows new dilated small bowel loops suggestive of partial small bowel obstruction. -Supportive care with pain control and antiemetics. -placed on IV vancomycin for surgery, but given resolution of colitis and c-diff negative , i would discontinue it and monitor off abx for now.  -GI was consulted to evaluate for colonoscopy given her ongoing colitis. Her given resolution of findings on repeat CT, GI do not plan to perform colonoscopy.  ? Partial SBO As seen on follow-up abdominal CT on 3/22. No abdominal distention on exam or associated nausea or vomiting. Surprisingly she did have good stomal output with bowel prep done yesterday.  serial abd exam. Follow up with repeat abd xray.  Leukocytosis Persistent due to ? Colitis vs other intraabdominal process. Will monitor off abx.  Hepatic encephalopathy Resolved. Discontinued lactulose by GI given high stomal output. Continue rifaximin. GI consult appreciated.  Complex multiseptated pelvic mass She was  recently evaluated by outpatient GYN Dr. Everitt Amber ( 04/11/2014) and thought this was likely an ovarian cyst and less likely to be a malignancy. She recommended of repeat follow-up ultrasound in 3 months.  Hyponatremia Possibly related to high stomal output. Resumed home dose Lasix at a lower dose and even bilaterally edema. Sodium level now stable.   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 Blood glucose better controlled after Levemir dose increased to 30 units twice a day and added NovoLog 10 units 3 times a day for meal coverage.   Nonalcoholic cirrhosis of liver Stable on CT scan of abdomen and pelvis. Continue low sodium diet with fluid restriction. Follow-up with GI as outpatient  Essential hypertension Continue home medications.  Pancreatic cysts 2 small stable. Exophytic foci seen incidentally on abdominal CT and recommend follow-up MRI in one year.  DVT prophylaxis: sq heparin  Code Status:full code Family Communication: daughter at bedside Disposition Plan: Continue inpatient care. Discharge prolonged due to ongoing leukocytosis, high stomal output, abdominal pain and PSBO   Consultants:  [facility]  Procedures:  CT abd and pelvis on 3/18  [place] abd and transvaginal  Antibiotics:  IV cipro and flagyl 3/18--  HPI/Subjective: Patient seen and examined. He has ongoing right lower quadrant and supraumbilical pain. Afebrile.  Objective: Filed Vitals:   04/20/14 1420  BP: 144/52  Pulse: 69  Temp: 98.3 F (36.8 C)  Resp: 16    Intake/Output Summary (Last 24 hours) at 04/20/14 1439 Last data filed at 04/20/14 1421  Gross per 24 hour  Intake   2585 ml  Output   5300 ml  Net  -2715 ml   Filed Weights   04/18/14 0538 04/19/14 0426 04/20/14 0429  Weight: 101.515 kg (223 lb 12.8  oz) 101.288 kg (223 lb 4.8 oz) 101.742 kg (224 lb 4.8 oz)    Exam:   General: Elderly female lying in bed in no distress  HEENT: moist oral  mucosa,   Chest: clear b/l,   CVS: Normal S1 and S2, no murmurs   GI: Soft, nondistended, colostomy with liquid stool output, right lower quadrant and epigastric tenderness to palpation,  Musculoskeletal, warm, 1+ pitting edema B/L  CNS: Alert and oriented  Data Reviewed: Basic Metabolic Panel:  Recent Labs Lab 04/16/14 0525 04/17/14 0542 04/18/14 0704 04/19/14 0420 04/20/14 0536  NA 129* 131* 128* 135 137  K 4.0 4.5 4.7 4.5 4.4  CL 92* 98 99 102 100  CO2 27 23 22 24 25   GLUCOSE 204* 230* 222* 204* 146*  BUN 22 23 27* 28* 27*  CREATININE 1.25* 1.19* 1.23* 1.22* 1.17*  CALCIUM 9.5 9.2 9.3 9.7 9.9   Liver Function Tests:  Recent Labs Lab 04/14/14 0600 04/15/14 0528 04/18/14 0704  AST 40* 33 27  ALT 29 26 22   ALKPHOS 114 114 106  BILITOT 0.6 0.5 0.4  PROT 7.4 7.3 7.0  ALBUMIN 3.6 3.6 3.3*   No results for input(s): LIPASE, AMYLASE in the last 168 hours.  Recent Labs Lab 04/13/14 1815 04/14/14 0600  AMMONIA 45* 40*   CBC:  Recent Labs Lab 04/14/14 0600 04/15/14 0528 04/16/14 0525 04/17/14 0542 04/18/14 0704 04/19/14 0420 04/20/14 0536  WBC 14.0* 17.3* 17.2* 14.0* 17.6* 16.9* 17.5*  NEUTROABS 7.2 9.6* 8.9* 7.1  --   --   --   HGB 8.9* 9.1* 9.2* 8.5* 9.2* 8.6* 9.5*  HCT 28.7* 29.3* 28.5* 26.2* 29.0* 26.9* 30.2*  MCV 92.0 91.6 90.8 91.3 91.2 91.2 91.0  PLT 373 415* 374 377 359 362 390   Cardiac Enzymes: No results for input(s): CKTOTAL, CKMB, CKMBINDEX, TROPONINI in the last 168 hours. BNP (last 3 results)  Recent Labs  03/04/14 2100 04/12/14 0710  BNP 35.8 311.9*    ProBNP (last 3 results)  Recent Labs  07/16/13 0650 09/02/13 1803 03/30/14 1230  PROBNP 933.6* 558.0* 71.0    CBG:  Recent Labs Lab 04/19/14 1129 04/19/14 1652 04/19/14 2142 04/20/14 0747 04/20/14 1142  GLUCAP 226* 112* 251* 144* 214*    Recent Results (from the past 240 hour(s))  Urine culture     Status: None   Collection Time: 04/12/14  2:02 AM  Result  Value Ref Range Status   Specimen Description URINE, RANDOM  Final   Special Requests NONE  Final   Colony Count   Final    25,000 COLONIES/ML Performed at [facility]    Culture   Final    Multiple bacterial morphotypes present, none predominant. Suggest appropriate recollection if clinically indicated. Performed at [facility]    Report Status 04/13/2014 FINAL  Final  MRSA PCR Screening     Status: None   Collection Time: 04/12/14  3:44 AM  Result Value Ref Range Status   MRSA by PCR NEGATIVE NEGATIVE Final    Comment:        The GeneXpert MRSA Assay (FDA approved for NASAL specimens only), is one component of a comprehensive MRSA colonization surveillance program. It is not intended to diagnose MRSA infection nor to guide or monitor treatment for MRSA infections.   Clostridium Difficile by PCR     Status: None   Collection Time: 04/14/14 11:00 AM  Result Value Ref Range Status   C difficile by pcr NEGATIVE NEGATIVE  Final  Clostridium Difficile by PCR     Status: None   Collection Time: 04/17/14  8:19 AM  Result Value Ref Range Status   C difficile by pcr NEGATIVE NEGATIVE Final     Studies: Ct Abdomen Pelvis W Contrast  04/19/2014   CLINICAL DATA:  Colitis eval for perforated colon Cirrhosis, hernia repair, splecectomy, hyst GB appy, LUL removed. Lung ca dx 2004. Cervical ca. Known pelvic mass.  EXAM: CT ABDOMEN AND PELVIS WITH CONTRAST  TECHNIQUE: Multidetector CT imaging of the abdomen and pelvis was performed using the standard protocol following bolus administration of intravenous contrast.  CONTRAST:  125mL OMNIPAQUE IOHEXOL 300 MG/ML  SOLN  COMPARISON:  04/15/2014, 04/12/2014 and 03/04/2014  FINDINGS: Lung bases are within normal. Pacer leads are present over the heart which is mildly enlarged and unchanged.  Abdominal images demonstrate evidence of a previous cholecystectomy and splenectomy. Mild nodular contour of the liver compatible with history  of cirrhosis. Stable 1 cm cystic focus over the pancreatic head and stable 9 mm cystic focus over the pancreatic body. Adrenal glands are unremarkable. Appendix is not seen. There is calcified plaque over the abdominal aorta and iliac arteries.  Kidneys are normal in size without significant hydronephrosis or nephrolithiasis. The ureters are unremarkable.  There are 2 small stable hernias over the epigastric region with the larger just left of midline containing a small amount of fluid. There are postsurgical changes of the abdominal wall with diastases of the rectus abdominus muscles unchanged. Colostomy over the left lower quadrant is unchanged.  There are several contrast filled dilated small bowel loops which can be followed to the anterior midline of the mid abdomen as it is difficult to follow the small bowel beyond this point as contrast is not present distal to this point. The colostomy defect in the left lower quadrant does contained a few loops of small bowel. There may be developing small bowel obstruction due to adhesions over the anterior mid abdomen versus related to the colostomy site. No free peritoneal air. There has been interval resolution of the previously noted moderate wall thickening involving the cecum and ascending colon.  There is subcutaneous edema over the abdominal wall over the lower abdomen/pelvis. No change in patient's known pelvic mass thought to be ovarian origin. Evidence of a Hartmann's pouch. Bladder is unremarkable. Remainder the exam is unchanged.  IMPRESSION: Resolution of previously noted significant wall thickening of the ascending colon/cecum. New dilated small bowel loops measuring up to 5 cm in diameter which can be followed to the anterior midline of the mid abdomen as it is difficult to follow the small bowel beyond this point and no significant contrast seen distal to this point. This likely represents developing small-bowel obstruction which may be due to adhesions  over the anterior midline of the mid abdomen although may be related to patient's colostomy which contains several small bowel loops herniating through the colostomy defect.  Stable known pelvic mass thought to be ovarian in origin as described on previous CT and ultrasound.  Two small stable pancreatic cystic foci. Recommend followup MRI one year. This recommendation follows ACR consensus guidelines: Managing Incidental Findings on Abdominal CT: White Paper of the ACR Incidental Findings Committee. J Am Coll Radiol 2010;7:754-773  Multiple other chronic stable findings as described.  These results will be called to the ordering clinician or representative by the Radiologist Assistant, and communication documented in the PACS or zVision Dashboard.   Electronically Signed   By: Quillian Quince  Derrel Nip M.D.   On: 04/19/2014 17:03    Scheduled Meds: . atorvastatin  20 mg Oral q1800  . dicyclomine  10 mg Oral TID  . folic acid  1 mg Oral QPM  . furosemide  40 mg Oral BID  . heparin subcutaneous  5,000 Units Subcutaneous 3 times per day  . hydrALAZINE  37.5 mg Oral BID  . insulin aspart  0-15 Units Subcutaneous TID WC  . insulin aspart  0-5 Units Subcutaneous QHS  . insulin aspart  10 Units Subcutaneous TID WC  . insulin detemir  30 Units Subcutaneous BID  . isosorbide dinitrate  20 mg Oral BID  . lip balm  1 application Topical BID  . losartan  50 mg Oral Daily  . metoCLOPramide  10 mg Oral TID  . mometasone-formoterol  2 puff Inhalation BID  . pantoprazole  80 mg Oral Q1200  . potassium chloride  10 mEq Oral Daily  . rifaximin  550 mg Oral BID  . saccharomyces boulardii  250 mg Oral BID  . sodium chloride  1,000 mL Intravenous Once  . sodium chloride  500 mL Intravenous Once  . sodium chloride  3 mL Intravenous Q12H   Continuous Infusions: . sodium chloride 20 mL/hr at 04/20/14 0415      Time spent: 25 minutes    Royale Swamy, [place]  [facility] Pager [phone]. If 7PM-7AM, please  contact night-coverage at [email or web address], password [facility] 04/20/2014, 2:39 PM  LOS: 8 days

## 2014-04-20 NOTE — Progress Notes (Signed)
Subjective: She got a bowel prep yesterday for colonoscopy and didn't get sick, nothing initially and then it opened up with good liquid output since.  They just emptied the bag again with a good deal of liquid stool.  She still complains of tenderness and some discomfort.    Objective: Vital signs in last 24 hours: Temp:  [97.6 F (36.4 C)-98.4 F (36.9 C)] 98.2 F (36.8 C) (03/23 0429) Pulse Rate:  [62-68] 65 (03/23 0429) Resp:  [18] 18 (03/23 0429) BP: (133-162)/(42-58) 140/58 mmHg (03/23 0429) SpO2:  [96 %-100 %] 99 % (03/23 0429) Weight:  [101.742 kg (224 lb 4.8 oz)] 101.742 kg (224 lb 4.8 oz) (03/23 0429) Last BM Date: 04/19/14 2240 PO recorded:  Diet - Carb Modified 600 stool yesterday and 525 in stool recorded this AM Afebrile, VSS WBC is up but stable Creatinine is stable CT scan yesterday show resolution of thickened ascending colon/cecum.  New SB loop dilatation, up to 5 CM with possible SBO.  Parastomal hernia with some bowel in defect, but not defined obstruction. Intake/Output from previous day: 03/22 0701 - 03/23 0700 In: 2595 [P.O.:2240; I.V.:55; IV Piggyback:300] Out: 4100 [Urine:3500; Stool:600] Intake/Output this shift: Total I/O In: -  Out: 200 [Urine:200]  General appearance: alert, cooperative, no distress and still some abdominal discomfort, wants to eat, up in chair.  Ostomy bag full again. Resp: clear to auscultation bilaterally GI: up in chair still complains she is tender.  + BS, good ostomy output.  Lab Results:   Recent Labs  04/19/14 0420 04/20/14 0536  WBC 16.9* 17.5*  HGB 8.6* 9.5*  HCT 26.9* 30.2*  PLT 362 390    BMET  Recent Labs  04/19/14 0420 04/20/14 0536  NA 135 137  K 4.5 4.4  CL 102 100  CO2 24 25  GLUCOSE 204* 146*  BUN 28* 27*  CREATININE 1.22* 1.17*  CALCIUM 9.7 9.9   PT/INR No results for input(s): LABPROT, INR in the last 72 hours.   Recent Labs Lab 04/14/14 0600 04/15/14 0528 04/18/14 0704  AST 40*  33 27  ALT _0 ALKPHOS 114 114 106  BILITOT 0.6 0.5 0.4  PROT 7.4 7.3 7.0  ALBUMIN 3.6 3.6 3.3*     Lipase     Component Value Date/Time   LIPASE 42 04/11/2014 2246     Studies/Results: Ct Abdomen Pelvis W Contrast  04/19/2014   CLINICAL DATA:  Colitis eval for perforated colon Cirrhosis, hernia repair, splecectomy, hyst GB appy, LUL removed. Lung ca dx 2004. Cervical ca. Known pelvic mass.  EXAM: CT ABDOMEN AND PELVIS WITH CONTRAST  TECHNIQUE: Multidetector CT imaging of the abdomen and pelvis was performed using the standard protocol following bolus administration of intravenous contrast.  CONTRAST:  168m OMNIPAQUE IOHEXOL 300 MG/ML  SOLN  COMPARISON:  04/15/2014, 04/12/2014 and 03/04/2014  FINDINGS: Lung bases are within normal. Pacer leads are present over the heart which is mildly enlarged and unchanged.  Abdominal images demonstrate evidence of a previous cholecystectomy and splenectomy. Mild nodular contour of the liver compatible with history of cirrhosis. Stable 1 cm cystic focus over the pancreatic head and stable 9 mm cystic focus over the pancreatic body. Adrenal glands are unremarkable. Appendix is not seen. There is calcified plaque over the abdominal aorta and iliac arteries.  Kidneys are normal in size without significant hydronephrosis or nephrolithiasis. The ureters are unremarkable.  There are 2 small stable hernias over the epigastric region with the larger just left of  midline containing a small amount of fluid. There are postsurgical changes of the abdominal wall with diastases of the rectus abdominus muscles unchanged. Colostomy over the left lower quadrant is unchanged.  There are several contrast filled dilated small bowel loops which can be followed to the anterior midline of the mid abdomen as it is difficult to follow the small bowel beyond this point as contrast is not present distal to this point. The colostomy defect in the left lower quadrant does contained a  few loops of small bowel. There may be developing small bowel obstruction due to adhesions over the anterior mid abdomen versus related to the colostomy site. No free peritoneal air. There has been interval resolution of the previously noted moderate wall thickening involving the cecum and ascending colon.  There is subcutaneous edema over the abdominal wall over the lower abdomen/pelvis. No change in patient's known pelvic mass thought to be ovarian origin. Evidence of a Hartmann's pouch. Bladder is unremarkable. Remainder the exam is unchanged.  IMPRESSION: Resolution of previously noted significant wall thickening of the ascending colon/cecum. New dilated small bowel loops measuring up to 5 cm in diameter which can be followed to the anterior midline of the mid abdomen as it is difficult to follow the small bowel beyond this point and no significant contrast seen distal to this point. This likely represents developing small-bowel obstruction which may be due to adhesions over the anterior midline of the mid abdomen although may be related to patient's colostomy which contains several small bowel loops herniating through the colostomy defect.  Stable known pelvic mass thought to be ovarian in origin as described on previous CT and ultrasound.  Two small stable pancreatic cystic foci. Recommend followup MRI one year. This recommendation follows ACR consensus guidelines: Managing Incidental Findings on Abdominal CT: White Paper of the ACR Incidental Findings Committee. J Am Coll Radiol 2010;7:754-773  Multiple other chronic stable findings as described.  These results will be called to the ordering clinician or representative by the Radiologist Assistant, and communication documented in the PACS or zVision Dashboard.   Electronically Signed   By: Marin Olp M.D.   On: 04/19/2014 17:03    Medications: . atorvastatin  20 mg Oral q1800  . dicyclomine  10 mg Oral TID  . folic acid  1 mg Oral QPM  . furosemide   40 mg Oral BID  . heparin subcutaneous  5,000 Units Subcutaneous 3 times per day  . hydrALAZINE  37.5 mg Oral BID  . insulin aspart  0-15 Units Subcutaneous TID WC  . insulin aspart  0-5 Units Subcutaneous QHS  . insulin aspart  10 Units Subcutaneous TID WC  . insulin detemir  30 Units Subcutaneous BID  . isosorbide dinitrate  20 mg Oral BID  . lip balm  1 application Topical BID  . losartan  50 mg Oral Daily  . metoCLOPramide  10 mg Oral TID  . mometasone-formoterol  2 puff Inhalation BID  . pantoprazole  80 mg Oral Q1200  . peg 3350 powder  0.5 kit Oral Once  . potassium chloride  10 mEq Oral Daily  . rifaximin  550 mg Oral BID  . sodium chloride  1,000 mL Intravenous Once  . sodium chloride  500 mL Intravenous Once  . sodium chloride  3 mL Intravenous Q12H  . 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 Denman George, Gyn. NASH/Cirrhosis Encephalopathy Hypertension  AODM COPD DVT:  Heparin/SCD   Plan:  She is no longer obstructed and is asking about breakfast.  I will defer to Medicine.      LOS: 8 days    Eilis Chestnutt 04/20/2014

## 2014-04-21 DIAGNOSIS — K7460 Unspecified cirrhosis of liver: Secondary | ICD-10-CM | POA: Diagnosis present

## 2014-04-21 LAB — GLUCOSE, CAPILLARY
GLUCOSE-CAPILLARY: 115 mg/dL — AB (ref 70–99)
GLUCOSE-CAPILLARY: 169 mg/dL — AB (ref 70–99)

## 2014-04-21 LAB — CBC
HCT: 29.6 % — ABNORMAL LOW (ref 36.0–46.0)
Hemoglobin: 9.4 g/dL — ABNORMAL LOW (ref 12.0–15.0)
MCH: 28.7 pg (ref 26.0–34.0)
MCHC: 31.8 g/dL (ref 30.0–36.0)
MCV: 90.5 fL (ref 78.0–100.0)
PLATELETS: 380 10*3/uL (ref 150–400)
RBC: 3.27 MIL/uL — ABNORMAL LOW (ref 3.87–5.11)
RDW: 15.3 % (ref 11.5–15.5)
WBC: 14.3 10*3/uL — ABNORMAL HIGH (ref 4.0–10.5)

## 2014-04-21 MED ORDER — POLYETHYLENE GLYCOL 3350 17 G PO PACK: 17.0000 g | PACK | Freq: Two times a day (BID) | ORAL | Status: DC | PRN

## 2014-04-21 MED ORDER — SACCHAROMYCES BOULARDII 250 MG PO CAPS: 250.0000 mg | ORAL_CAPSULE | Freq: Two times a day (BID) | ORAL | Status: DC

## 2014-04-21 MED ORDER — LACTULOSE 10 G PO PACK: 10.0000 g | PACK | Freq: Two times a day (BID) | ORAL | Status: DC | PRN

## 2014-04-21 MED ORDER — RIFAXIMIN 550 MG PO TABS
550.0000 mg | ORAL_TABLET | Freq: Two times a day (BID) | ORAL | Status: DC
Start: 2014-04-21 — End: 2014-06-14

## 2014-04-21 NOTE — Progress Notes (Signed)
Patient ID: [name], female   DOB: 12-Mar-1950, 65 y.o.   MRN: [id number]     [place]      [address] [place], [place] [address]    Phone: [phone] FAX: [phone]     Subjective: Tearful when discussing her pain meds.  Ate all breakfast.  No n/v.  Persistent pain.   Objective:  Vital signs:  Filed Vitals:   04/20/14 2057 04/20/14 2137 04/21/14 0501 04/21/14 0848  BP:  156/53 144/49   Pulse:  95 64   Temp:  97.7 F (36.5 C) 97.8 F (36.6 C)   TempSrc:  Oral Oral   Resp:  16 16   Height:      Weight:   97.523 kg (215 lb)   SpO2: 98% 100% 96% 99%    Last BM Date: 04/21/14  Intake/Output   Yesterday:  03/23 0701 - 03/24 0700 In: 480 [P.O.:480] Out: 6200 [Urine:4500; Stool:1700] This shift:  Total I/O In: 480 [P.O.:480] Out: 500 [Urine:500]  Physical Exam: General: Pt awake/alert/oriented x4 in no acute distress  Abdomen: Soft.  Nondistended. Obese, no pain with deep pressure upon auscultation, but pain with palpation.  No evidence of peritonitis.  No incarcerated hernias. Stoma is pink and viable, functioning.     Problem List:   Active Problems:   Squamous cell lung cancer   Type 2 diabetes, uncontrolled, with neuropathy   Major depressive disorder, recurrent episode   Essential hypertension   COPD with emphysema   GERD   Cirrhosis of liver not due to alcohol   Colostomy in place   Hepatic encephalopathy   Encephalopathy   Infectious colitis   Abdominal pain, generalized   Partial small bowel obstruction   Leucocytosis    Results:   Labs: Results for orders placed or performed during the hospital encounter of 04/11/14 (from the past 48 hour(s))  Glucose, capillary     Status: Abnormal   Collection Time: 04/19/14 11:29 AM  Result Value Ref Range   Glucose-Capillary 226 (H) 70 - 99 mg/dL  Glucose, capillary     Status: Abnormal   Collection Time: 04/19/14  4:52 PM  Result Value  Ref Range   Glucose-Capillary 112 (H) 70 - 99 mg/dL  Glucose, capillary     Status: Abnormal   Collection Time: 04/19/14  9:42 PM  Result Value Ref Range   Glucose-Capillary 251 (H) 70 - 99 mg/dL  CBC     Status: Abnormal   Collection Time: 04/20/14  5:36 AM  Result Value Ref Range   WBC 17.5 (H) 4.0 - 10.5 K/uL    Comment: WHITE COUNT CONFIRMED ON SMEAR   RBC 3.32 (L) 3.87 - 5.11 MIL/uL   Hemoglobin 9.5 (L) 12.0 - 15.0 g/dL   HCT 30.2 (L) 36.0 - 46.0 %   MCV 91.0 78.0 - 100.0 fL   MCH 28.6 26.0 - 34.0 pg   MCHC 31.5 30.0 - 36.0 g/dL   RDW 15.2 11.5 - 15.5 %   Platelets 390 150 - 400 K/uL    Comment: LARGE PLATELETS PRESENT PLATELET COUNT CONFIRMED BY SMEAR   Basic metabolic panel     Status: Abnormal   Collection Time: 04/20/14  5:36 AM  Result Value Ref Range   Sodium 137 135 - 145 mmol/L   Potassium 4.4 3.5 - 5.1 mmol/L   Chloride 100 96 - 112 mmol/L   CO2 25 19 - 32 mmol/L   Glucose, Bld  146 (H) 70 - 99 mg/dL   BUN 27 (H) 6 - 23 mg/dL   Creatinine, Ser 1.17 (H) 0.50 - 1.10 mg/dL   Calcium 9.9 8.4 - 10.5 mg/dL   GFR calc non Af Amer 49 (L) >90 mL/min   GFR calc Af Amer 57 (L) >90 mL/min    Comment: (NOTE) The eGFR has been calculated using the CKD EPI equation. This calculation has not been validated in all clinical situations. eGFR's persistently <90 mL/min signify possible Chronic Kidney Disease.    Anion gap 12 5 - 15  Glucose, capillary     Status: Abnormal   Collection Time: 04/20/14  7:47 AM  Result Value Ref Range   Glucose-Capillary 144 (H) 70 - 99 mg/dL  Glucose, capillary     Status: Abnormal   Collection Time: 04/20/14 11:42 AM  Result Value Ref Range   Glucose-Capillary 214 (H) 70 - 99 mg/dL  Glucose, capillary     Status: Abnormal   Collection Time: 04/20/14  4:59 PM  Result Value Ref Range   Glucose-Capillary 121 (H) 70 - 99 mg/dL  Glucose, capillary     Status: Abnormal   Collection Time: 04/20/14  9:36 PM  Result Value Ref Range    Glucose-Capillary 150 (H) 70 - 99 mg/dL  CBC     Status: Abnormal   Collection Time: 04/21/14  5:33 AM  Result Value Ref Range   WBC 14.3 (H) 4.0 - 10.5 K/uL    Comment: WHITE COUNT CONFIRMED ON SMEAR   RBC 3.27 (L) 3.87 - 5.11 MIL/uL   Hemoglobin 9.4 (L) 12.0 - 15.0 g/dL   HCT 29.6 (L) 36.0 - 46.0 %   MCV 90.5 78.0 - 100.0 fL   MCH 28.7 26.0 - 34.0 pg   MCHC 31.8 30.0 - 36.0 g/dL   RDW 15.3 11.5 - 15.5 %   Platelets 380 150 - 400 K/uL    Comment: LARGE PLATELETS PRESENT PLATELET COUNT CONFIRMED BY SMEAR   Glucose, capillary     Status: Abnormal   Collection Time: 04/21/14  7:24 AM  Result Value Ref Range   Glucose-Capillary 115 (H) 70 - 99 mg/dL    Imaging / Studies: Ct Abdomen Pelvis W Contrast  04/19/2014   CLINICAL DATA:  Colitis eval for perforated colon Cirrhosis, hernia repair, splecectomy, hyst GB appy, LUL removed. Lung ca dx 2004. Cervical ca. Known pelvic mass.  EXAM: CT ABDOMEN AND PELVIS WITH CONTRAST  TECHNIQUE: Multidetector CT imaging of the abdomen and pelvis was performed using the standard protocol following bolus administration of intravenous contrast.  CONTRAST:  165m OMNIPAQUE IOHEXOL 300 MG/ML  SOLN  COMPARISON:  04/15/2014, 04/12/2014 and 03/04/2014  FINDINGS: Lung bases are within normal. Pacer leads are present over the heart which is mildly enlarged and unchanged.  Abdominal images demonstrate evidence of a previous cholecystectomy and splenectomy. Mild nodular contour of the liver compatible with history of cirrhosis. Stable 1 cm cystic focus over the pancreatic head and stable 9 mm cystic focus over the pancreatic body. Adrenal glands are unremarkable. Appendix is not seen. There is calcified plaque over the abdominal aorta and iliac arteries.  Kidneys are normal in size without significant hydronephrosis or nephrolithiasis. The ureters are unremarkable.  There are 2 small stable hernias over the epigastric region with the larger just left of midline containing  a small amount of fluid. There are postsurgical changes of the abdominal wall with diastases of the rectus abdominus muscles unchanged. Colostomy over the left lower  quadrant is unchanged.  There are several contrast filled dilated small bowel loops which can be followed to the anterior midline of the mid abdomen as it is difficult to follow the small bowel beyond this point as contrast is not present distal to this point. The colostomy defect in the left lower quadrant does contained a few loops of small bowel. There may be developing small bowel obstruction due to adhesions over the anterior mid abdomen versus related to the colostomy site. No free peritoneal air. There has been interval resolution of the previously noted moderate wall thickening involving the cecum and ascending colon.  There is subcutaneous edema over the abdominal wall over the lower abdomen/pelvis. No change in patient's known pelvic mass thought to be ovarian origin. Evidence of a Hartmann's pouch. Bladder is unremarkable. Remainder the exam is unchanged.  IMPRESSION: Resolution of previously noted significant wall thickening of the ascending colon/cecum. New dilated small bowel loops measuring up to 5 cm in diameter which can be followed to the anterior midline of the mid abdomen as it is difficult to follow the small bowel beyond this point and no significant contrast seen distal to this point. This likely represents developing small-bowel obstruction which may be due to adhesions over the anterior midline of the mid abdomen although may be related to patient's colostomy which contains several small bowel loops herniating through the colostomy defect.  Stable known pelvic mass thought to be ovarian in origin as described on previous CT and ultrasound.  Two small stable pancreatic cystic foci. Recommend followup MRI one year. This recommendation follows ACR consensus guidelines: Managing Incidental Findings on Abdominal CT: White Paper of  the ACR Incidental Findings Committee. J Am Coll Radiol 2010;7:754-773  Multiple other chronic stable findings as described.  These results will be called to the ordering clinician or representative by the Radiologist Assistant, and communication documented in the PACS or zVision Dashboard.   Electronically Signed   By: Marin Olp M.D.   On: 04/19/2014 17:03   Dg Abd Acute W/chest  04/20/2014   CLINICAL DATA:  Abdominal pain extending from the upper abdomen to the left lower quadrant. Prior diagnosis of small bowel obstruction  EXAM: ACUTE ABDOMEN SERIES (ABDOMEN 2 VIEW & CHEST 1 VIEW)  COMPARISON:  CT 04/19/2014  FINDINGS: Left pacer is in place with leads in the right atrium and right ventricle, unchanged. Chronic interstitial prominence throughout the lungs without confluent airspace opacity or effusion. Heart is normal size.  Gas within mildly prominent bowel loops centrally in the abdomen, some which appear to represent colon. There likely are mildly dilated small bowel loops as well, similar to recent CT. No free air. No organomegaly. Prior cholecystectomy. No acute bony abnormality or suspicious calcification.  IMPRESSION: Mildly prominent mid abdominal bowel loops, likely both large and small bowel. Overall small bowel dilatation likely similar to recent CT.   Electronically Signed   By: Rolm Baptise M.D.   On: 04/20/2014 15:51    Medications / Allergies:  Scheduled Meds: . atorvastatin  20 mg Oral q1800  . dicyclomine  10 mg Oral TID  . folic acid  1 mg Oral QPM  . furosemide  40 mg Oral BID  . heparin subcutaneous  5,000 Units Subcutaneous 3 times per day  . hydrALAZINE  37.5 mg Oral BID  . insulin aspart  0-15 Units Subcutaneous TID WC  . insulin aspart  0-5 Units Subcutaneous QHS  . insulin aspart  10 Units Subcutaneous TID WC  .  insulin detemir  30 Units Subcutaneous BID  . isosorbide dinitrate  20 mg Oral BID  . lip balm  1 application Topical BID  . losartan  50 mg Oral Daily   . metoCLOPramide  10 mg Oral TID  . mometasone-formoterol  2 puff Inhalation BID  . pantoprazole  80 mg Oral Q1200  . potassium chloride  10 mEq Oral Daily  . rifaximin  550 mg Oral BID  . saccharomyces boulardii  250 mg Oral BID  . sodium chloride  1,000 mL Intravenous Once  . sodium chloride  500 mL Intravenous Once  . sodium chloride  3 mL Intravenous Q12H   Continuous Infusions: . sodium chloride 20 mL/hr at 04/20/14 0415   PRN Meds:.albuterol, bisacodyl, HYDROmorphone (DILAUDID) injection, ipratropium-albuterol, lactated ringers, LORazepam, magic mouthwash, menthol-cetylpyridinium, ondansetron **OR** ondansetron (ZOFRAN) IV, oxyCODONE, phenol, polyethylene glycol, promethazine  Antibiotics: Anti-infectives    Start     Dose/Rate Route Frequency Ordered Stop   04/19/14 1000  ceFEPIme (MAXIPIME) 1 g in dextrose 5 % 50 mL IVPB  Status:  Discontinued     1 g 100 mL/hr over 30 Minutes Intravenous Every 12 hours 04/19/14 0821 04/19/14 0830   04/19/14 1000  vancomycin (VANCOCIN) 1,250 mg in sodium chloride 0.9 % 250 mL IVPB  Status:  Discontinued     1,250 mg 166.7 mL/hr over 90 Minutes Intravenous Every 24 hours 04/19/14 0903 04/20/14 1304   04/19/14 0930  meropenem (MERREM) 1 g in sodium chloride 0.9 % 100 mL IVPB  Status:  Discontinued     1 g 200 mL/hr over 30 Minutes Intravenous Every 8 hours 04/19/14 0830 04/20/14 0747   04/15/14 1000  ciprofloxacin (CIPRO) IVPB 400 mg  Status:  Discontinued     400 mg 200 mL/hr over 60 Minutes Intravenous Every 12 hours 04/15/14 0858 04/19/14 0847   04/15/14 0930  metroNIDAZOLE (FLAGYL) IVPB 500 mg  Status:  Discontinued     500 mg 100 mL/hr over 60 Minutes Intravenous Every 8 hours 04/15/14 0858 04/19/14 0847   04/14/14 1200  metroNIDAZOLE (FLAGYL) tablet 500 mg  Status:  Discontinued     500 mg Oral 3 times per day 04/14/14 0947 04/15/14 0858   04/12/14 1200  rifaximin (XIFAXAN) tablet 550 mg     550 mg Oral 2 times daily 04/12/14 1040      04/12/14 0800  ciprofloxacin (CIPRO) IVPB 400 mg  Status:  Discontinued     400 mg 200 mL/hr over 60 Minutes Intravenous Every 12 hours 04/12/14 0738 04/14/14 0945        Assessment/Plan Acute on chronic abdominal pain Colitis PSBO Clinically does not appear obstructed, high ostomy output.  Tolerating PO.  C/o pain, but ate all her breakfast.   Would recommend minimizing narcotic use and utilizing non narcotic therapies to control pain, but this is difficult as the patient may have some dependence issues.  Will defer to medicine team.  Surgery is signing off.  Please call [facility] with questions or concerns.   Erby Pian, [facility] Surgery Pager [phone])   04/21/2014 9:50 AM

## 2014-04-21 NOTE — Progress Notes (Signed)
    Progress Note   Subjective  Diarrhea resolved. No nausea. Chronic abdominal pain   Objective   Vital signs in last 24 hours: Temp:  [97.7 F (36.5 C)-98.3 F (36.8 C)] 97.8 F (36.6 C) (03/24 0501) Pulse Rate:  [64-95] 64 (03/24 0501) Resp:  [16] 16 (03/24 0501) BP: (144-156)/(49-53) 144/49 mmHg (03/24 0501) SpO2:  [96 %-100 %] 99 % (03/24 0848) Weight:  [215 lb (97.523 kg)] 215 lb (97.523 kg) (03/24 0501) Last BM Date: 04/21/14 General:    white female in NAD Abdomen:  Soft, nontender and nondistended. Normal bowel sounds. Approx 133ml liquid brown stool in ostomy Neurologic:  Alert and oriented,  grossly normal neurologically. Psych:  Cooperative. Normal mood and affect.  Lab Results:  Recent Labs  04/19/14 0420 04/20/14 0536 04/21/14 0533  WBC 16.9* 17.5* 14.3*  HGB 8.6* 9.5* 9.4*  HCT 26.9* 30.2* 29.6*  PLT 362 390 380   BMET  Recent Labs  04/19/14 0420 04/20/14 0536  NA 135 137  K 4.5 4.4  CL 102 100  CO2 24 25  GLUCOSE 204* 146*  BUN 28* 27*  CREATININE 1.22* 1.17*  CALCIUM 9.7 9.9      Assessment / Plan:   53. 64 year old female with diarrhea and "colitis" on non-contrast CTscan. Repeat CT with contrast didn't show colitis. Colonoscopy was therefore cancelled. CTscan also suggested partial SBO. No nausea today. She has active bowel sounds and ostomy output so obstructive process resolving. WBC improving, down to 14.   2. 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 Resolved. On Xifaxan.     LOS: 9 days   Tye Savoy  04/21/2014, 9:07 AM   ________________________________________________________________________  Velora Heckler GI MD note:  I personally examined the patient, reviewed the data and agree with the assessment and plan described above.  She is OK to d/c from my perspective. Her encephalopathy has resolved, nothing serious on CT scans, her abd pain is probably just exacerbation of chronic symptoms.  She should call for  follow up with Dr. [name]. She should stay on BID xifaxin for HE prophylaxis and also lactulose 25ml once or twice daily, indefinitely.   Please call or page with any further questions or concerns.    Owens Loffler, MD [facility] Gastroenterology Pager [phone]

## 2014-04-21 NOTE — Discharge Instructions (Signed)
Hepatic Encephalopathy °Hepatic encephalopathy is a syndrome. This is a set of symptoms that occur together. It is seen mostly in patients with damage to the liver known as cirrhosis. This is where normal liver tissue has been replaced by scar tissue.  °Symptoms of the syndrome include: °· Changes in personality. °· Mental impairment. °· A depressed level of consciousness. °These changes occur because toxins build up in the bloodstream. The build up occurs because the scarred liver cannot rid toxins from the body. The most important of these toxins is ammonia. Toxins can cause abnormal behavior and confusion. Toxins in the blood stream can impair your ability to take care of yourself or others. Some people become very sleepy and cannot be woken easily. In severe cases, the patient lapses into a coma.  °CAUSES  °There are many things that can cause liver damage that can lead to buildup of toxins. These include: °· Diseases that cause cirrhosis of the liver. °· Long-term alcohol use with progressive liver damage. °· Hepatitis B or C with ongoing infection and liver damage. °· Patients without cirrhosis who have undergone shunt surgery. °· Kidney failure. °· Bleeding in the stomach or intestines. °· Infection. °· Constipation. °· Medications that act upon the central nervous system. °· Diuretic therapy. °· Excessive dietary protein. °SYMPTOMS  °Symptoms of this syndrome are categorized or "staged" based on severity.  °· Stage 0. Minimal hepatic encephalopathy. No detectable changes in personality or behavior. Minimal changes in memory, concentration, mental function, and physical ability. °· Stage 1. Some lack of awareness. Shortened attention span. Problems with addition or subtraction. Possible problems with sleeping or a reversal of the normal sleep pattern. Euphoria, depression, or irritability may be present. Mild confusion. Slowing of mental ability. Tremors may be detected. °· Stage 2. Lethargy or apathy.  Disoriented. Strange behavior. Slurred speech. Obvious tremors. Drowsiness, unable to perform mental tasks. Personality changes, and confusion about time. °· Stage 3. Very sleepy but can be aroused. Unable to perform mental tasks, cannot keep track of time and place, marked confusion, amnesia, occasional fits of rage, speech cannot be understood. °· Stage 4. Coma with or without response to painful stimuli. °DIAGNOSIS  °In mild cases, a careful history and physical exam may lead your caregiver to consider possible mild hepatic encephalopathy as the cause of symptoms. The diagnosis is clearer in more severe cases. An elevated blood ammonia level is the classic blood test abnormality in patients with this syndrome. Other tests can be helpful to rule out other diseases.  °TREATMENT  °· Medications are often used to lower the ammonia level in the blood. This usually leads to improvement. °· Diets containing vegetable proteins are better than diets rich in animal protein, especially proteins derived from red meats. Eating well-cooked chicken and fish in addition to vegetable protein should be discussed with your caregiver. Malnourished patients are encouraged to add liquid nutritional supplements to their diet. °· Antibiotics are sometimes used to try to lessen the volume of bacteria in the intestines that produce ammonia. °· Moderate to severe cases of this syndrome usually require a hospital stay and medicine that is given directly into a vein (intravenously). °HOME CARE INSTRUCTIONS  °The goal at home is to avoid things that can make the condition worse and lead to a buildup of ammonia in the blood. °· Eat a well balanced diet. Your caregiver can help you with suggestions on this. °· Talk to your caregiver before taking vitamin supplements. Large doses of vitamins and minerals,   especially vitamin A, iron, or copper, can worsen liver damage. °· A low salt diet, water restriction, or diuretic medicine may be needed to  reduce fluid retention. °· Avoid alcohol and acetaminophen as well as any over-the-counter medications that contain acetaminophen (check labels). Only take over-the-counter or prescription medicines for pain, discomfort, or fever as directed by your caregiver. °· Avoid drugs that are toxic to the liver. Review your medications (both prescription and non-prescription) with your caregiver to make sure those you are taking will not be harmful. °· Blood tests may be needed. Follow your caregiver's advice regarding the timing of these. °· With this condition you play a critical role in maintaining your own good health. The failure to follow your caregiver's advice and these instructions may result in permanent disability or death. °SEEK MEDICAL CARE IF:  °· You have increasing fatigue or weakness. °· You develop increasing swelling of the abdomen, hands, feet, legs or face. °· You develop loss of appetite. °· You are feeling sick to your stomach (nausea) and vomiting. °· You develop jaundice. This is a yellow discoloration of the skin. °· You develop worsening problems with concentration, confusion, and/or problems with sleep. °SEEK IMMEDIATE MEDICAL CARE IF:  °· You vomit bright red blood or a coffee ground-looking material. °· You have blood in your stools. Or the stools turn black and tarry. °· You have a fever. °· You develop easy bruising or bleeding. °· You have a return of slurred speech, change in behavior, or confusion. °MAKE SURE YOU:  °· Understand these instructions. °· Will watch your condition. °· Will get help right away if you are not doing well or get worse. °Document Released: 03/26/2006 Document Revised: 04/08/2011 Document Reviewed: 12/31/2006 °[employer]® Patient Information ©2015 [employer]. This information is not intended to replace advice given to you by your health care provider. Make sure you discuss any questions you have with your health care provider. ° °

## 2014-04-21 NOTE — Evaluation (Signed)
Physical Therapy One Time Evaluation Patient Details Name: [name] MRN: [id number] DOB: Feb 16, 1950 Today's Date: 04/21/2014   History of Present Illness  65 year old female with history of nonalcoholic cirrhosis of liver, uncontrolled diabetes mellitus, COPD with chronic respiratory failure on home O2, history of colostomy, recently found complex pelvic mass (following with GYN as outpatient) admitted 04/11/14 with acute hepatic encephalopathy, with hospital stay complicated by colitis and PSBO.  Clinical Impression  Patient evaluated by Physical Therapy with no further acute PT needs identified. All education has been completed and the patient has no further questions.  Pt reports generalized weakness as she has not ambulated since admission so agreeable to HHPT upon d/c.  She states she was recently discharged from [facility] however then was admitted to hospital.  PT is signing off. Thank you for this referral.     Follow Up Recommendations Home health PT    Equipment Recommendations  None recommended by PT    Recommendations for Other Services       Precautions / Restrictions Precautions Precaution Comments: chronic O2      Mobility  Bed Mobility               General bed mobility comments: pt up in recliner on arrival  Transfers Overall transfer level: Needs assistance Equipment used: Rolling walker (2 wheeled) Transfers: Sit to/from Stand Sit to Stand: Min guard;Supervision            Ambulation/Gait Ambulation/Gait assistance: Min guard Ambulation Distance (Feet): 160 Feet Assistive device: Rolling walker (2 wheeled) Gait Pattern/deviations: Step-through pattern     General Gait Details: initially shaky extremities and pt reports generalized weakness due to not ambulating since admission however able to tolerate good distance with RW remaining on 2L O2 [place]  Stairs            Wheelchair Mobility    Modified Rankin (Stroke Patients Only)        Balance                                             Pertinent Vitals/Pain Pain Assessment: No/denies pain    Home Living Family/patient expects to be discharged to:: Private residence Living Arrangements: Children Available Help at Discharge: Available 24 hours/day (daughter) Type of Home: House Home Access: Stairs to enter Entrance Stairs-Rails: None Entrance Stairs-Number of Steps: 3 Home Layout: One level Home Equipment: Cane - single point;Walker - 2 wheels;Bedside commode;Tub bench      Prior Function Level of Independence: Independent with assistive device(s)         Comments: uses RW for all mobility     Hand Dominance        Extremity/Trunk Assessment               Lower Extremity Assessment: Generalized weakness         Communication   Communication: No difficulties  Cognition Arousal/Alertness: Awake/alert Behavior During Therapy: WFL for tasks assessed/performed Overall Cognitive Status: Within Functional Limits for tasks assessed                      General Comments      Exercises        Assessment/Plan    PT Assessment All further PT needs can be met in the next venue of care  PT Diagnosis Generalized weakness   PT  Problem List Decreased activity tolerance;Decreased mobility;Decreased strength  PT Treatment Interventions     PT Goals (Current goals can be found in the Care Plan section) Acute Rehab PT Goals PT Goal Formulation: All assessment and education complete, DC therapy    Frequency     Barriers to discharge        Co-evaluation               End of Session Equipment Utilized During Treatment: Gait belt;Oxygen Activity Tolerance: Patient tolerated treatment well Patient left: in chair;with call bell/phone within reach Nurse Communication: Mobility status         Time: 1222-4114 PT Time Calculation (min) (ACUTE ONLY): 16 min   Charges:   PT Evaluation $Initial PT  Evaluation Tier I: 1 Procedure     PT G Codes:        Harjit Leider,KATHrine E 04/21/2014, 2:13 PM Carmelia Bake, PT, DPT 04/21/2014 Pager: [phone]

## 2014-04-21 NOTE — Discharge Summary (Addendum)
Physician Discharge Summary  ARIEONNA MEDINE URK:[id number] DOB: 10-08-1950 DOA: 04/11/2014  PCP: [name], MD  Admit date: 04/11/2014 Discharge date: 04/21/2014  Time spent: 35 minutes  Recommendations for Outpatient Follow-up:  1. Discharge home with resumption of home health PT, RN and nursing assistance 2. Follow-up with PCP in one week. Recheck CBC during outpatient follow-up 3. Follow-up with Dr. [name] in 4-6 weeks. She should be on rifaximin twice a day and lactulose bid ( titrate to at least 2 BM daily), indefinitely.  Discharge Diagnoses:  Principal Problem:   Hepatic encephalopathy  Active Problems:     Infectious colitis   Abdominal pain, generalized   Partial small bowel obstruction   Leucocytosis   Dehydration and hyponatremia   Non-alcoholic cirrhosis   Type 2 diabetes, uncontrolled, with neuropathy   Major depressive disorder, recurrent episode   Essential hypertension   COPD with emphysema   GERD   Colostomy in place     Squamous cell lung cancer      Discharge Condition: Fair  Diet recommendation: Diabetic  Filed Weights   04/19/14 0426 04/20/14 0429 04/21/14 0501  Weight: 101.288 kg (223 lb 4.8 oz) 101.742 kg (224 lb 4.8 oz) 97.523 kg (215 lb)    History of present illness:  64 year old female with history of nonalcoholic cirrhosis of liver, uncontrolled diabetes mellitus, COPD with chronic respiratory failure on home O2, history of colostomy, recently than a complex pelvic mass (following with GYN as outpatient) admitted to the hospital with acute hepatic encephalopathy. She was started on IV hydration, lactulose and rifaximin following which the encephalopathy resolved hospital course prolonged due to development of right-sided colitis, high output from the stoma and dehydration.   Hospital Course:   Hepatic encephalopathy Likely from constipation contributed by regular use of narcotics. Asian placed on scheduled lactulose with high stomal  output. Started on scheduled rifaximin as per GI recommendation. Her hepatic encephalopathy resolved within 2 days after hospitalization. GI recommends indefinite use of twice a day rifaximin and lactulose once or twice a day for hepatic encephalopathy prophylaxis. (Patient instructed to titrate lactulose to at least 2 loose bowel movements daily). -Added daily MiraLAX for constipation as well.  Acute infectious colitis with high stomal output Patient was placed on empiric IV Cipro and Flagyl. Repeat C. difficile negative. Had unexplained persistent leucocytosis despite adequate antibiotic coverage. Remains afebrile. Repeat abdominal CT done on 3/22 showed resolution of the colitis. Continued  supportive care with pain control and antiemetics. -Given resolution of colitis and c-diff negative , antibiotic discontinued. Leukocytosis now improving. -GI was consulted given ongoing colitis for need of colonoscopy. However given resolution of colitis on repeat CT scan, GI recommended this was not necessary.   Partial SBO As seen on follow-up abdominal CT on 3/22. No abdominal distention on exam or associated nausea or vomiting. Now resolved on serial abdominal exam and follow-up x-ray.  Leukocytosis Persistent due to ? Colitis . Now improving off antibiotics. Follow-up as outpatient.  Hepatic encephalopathy Resolved. Discontinued lactulose by GI given high stomal output. Continue rifaximin. GI consult appreciated.  Complex multiseptated pelvic mass She was recently evaluated by outpatient GYN Dr. Everitt Amber ( 04/11/2014) and thought this was likely an ovarian cyst and less likely to be a malignancy. She recommended of repeat follow-up ultrasound in 3 months.  Hyponatremia Possibly related to high stomal output. Resumed home dose Lasix upon discharge.. Sodium level now stable.   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 Levemir  dose increased and added pre-meal NovoLog  while in the hospital. Resume home dose 2-500 insulin upon discharge  Nonalcoholic cirrhosis of liver Stable on CT scan of abdomen and pelvis. Continue low sodium diet with fluid restriction. Follow-up with GI (Dr. [name]) as outpatient.  Essential hypertension Continue home medications.  Pancreatic cysts 2 small cyst noted incidentally on CT scan. stable. Exophytic foci seen incidentally on abdominal CT and recommend follow-up MRI in one year.  COPD with chronic respiratory failure Continue home inhaler and nebulizer. On 2 L O2 via nasal cannula.  Family Communication: Called daughter and left a message Disposition Plan:  Home with home health   Consultants:  Parkman surgery  Procedures:  CT abd and pelvis on 3/18  [place] abd and transvaginal  Antibiotics:  IV cipro and flagyl 3/18--3/23    Discharge Exam: Filed Vitals:   04/21/14 0501  BP: 144/49  Pulse: 64  Temp: 97.8 F (36.6 C)  Resp: 16     General: Elderly female lying in bed in no distress  HEENT: No pallor, no icterus, moist oral mucosa, neck supple  Chest: clear b/l, no added sounds  CVS: Normal S1 and S2, no murmurs   GI: Soft, nondistended, colostomy with liquid stool output, right lower quadrant and epigastric tenderness to palpation,  Musculoskeletal, warm, 1+ pitting edema B/L  CNS: Alert and oriented  Discharge Instructions    Current Discharge Medication List    START taking these medications   Details  lactulose (CEPHULAC) 10 G packet Take 1 packet (10 g total) by mouth 2 (two) times daily between meals as needed (titrate to at least 2 BMs daily). Qty: 60 each, Refills: 0    polyethylene glycol (MIRALAX / GLYCOLAX) packet Take 17 g by mouth every 12 (twelve) hours as needed for moderate constipation or severe constipation. Qty: 14 each, Refills: 0    rifaximin (XIFAXAN) 550 MG TABS tablet Take 1 tablet (550 mg total) by mouth 2 (two) times daily. Qty: 60  tablet, Refills: 0    saccharomyces boulardii (FLORASTOR) 250 MG capsule Take 1 capsule (250 mg total) by mouth 2 (two) times daily. Qty: 60 capsule, Refills: 0      CONTINUE these medications which have NOT CHANGED   Details  atorvastatin (LIPITOR) 20 MG tablet Take 1 tablet (20 mg total) by mouth every morning. Qty: 90 tablet, Refills: 3    diazepam (VALIUM) 5 MG tablet Take 1 tablet (5 mg total) by mouth 3 (three) times daily as needed. Qty: 90 tablet, Refills: 5    dicyclomine (BENTYL) 10 MG capsule Take 10 mg by mouth 3 (three) times daily.     Fluticasone-Salmeterol (ADVAIR) 250-50 MCG/DOSE AEPB Inhale 1 puff into the lungs every 12 (twelve) hours. Qty: 180 each, Refills: 3    folic acid (FOLVITE) 1 MG tablet Take 1 tablet (1 mg total) by mouth every evening. Qty: 90 tablet, Refills: 3    furosemide (LASIX) 40 MG tablet Take 2 tablets (80 mg total) by mouth 2 (two) times daily.    hydrALAZINE (APRESOLINE) 25 MG tablet Take 1.5 tablets (37.5 mg total) by mouth 2 (two) times daily. Qty: 270 tablet, Refills: 3    isosorbide dinitrate (ISORDIL) 20 MG tablet Take 1 tablet (20 mg total) by mouth 2 (two) times daily. Qty: 180 tablet, Refills: 3    losartan (COZAAR) 50 MG tablet Take 1 tablet (50 mg total) by mouth daily. Qty: 30 tablet, Refills: 11   Associated Diagnoses:  Chronic diastolic heart failure    metoCLOPramide (REGLAN) 10 MG tablet Take 1 tablet (10 mg total) by mouth 3 (three) times daily. Qty: 270 tablet, Refills: 3    metolazone (ZAROXOLYN) 2.5 MG tablet Take 2.5 mg by mouth daily.     Oxycodone HCl 20 MG TABS Take 1 tablet (20 mg total) by mouth every 3 (three) hours as needed (pain). Qty: 240 tablet, Refills: 0    potassium chloride (KLOR-CON 10) 10 MEQ tablet Take 1 tablet (10 mEq total) by mouth daily. Qty: 30 tablet, Refills: 11    promethazine (PHENERGAN) 25 MG tablet Take 1 tablet (25 mg total) by mouth every 4 (four) hours as needed for nausea or  vomiting. Qty: 120 tablet, Refills: 2    albuterol (PROVENTIL) (2.5 MG/3ML) 0.083% nebulizer solution Take 3 mLs (2.5 mg total) by nebulization every 4 (four) hours as needed for wheezing or shortness of breath. Qty: 75 mL, Refills: 12    albuterol-ipratropium (COMBIVENT) 18-103 MCG/ACT inhaler Inhale 2 puffs into the lungs every 4 (four) hours as needed for wheezing or shortness of breath.     insulin regular human CONCENTRATED (HUMULIN R) 500 UNIT/ML SOLN injection Inject under skin 0.33 mL before b'fast, 0.33 mL before lunch and 0.30 mL before dinner Qty: 40 mL, Refills: 2    INSULIN SYRINGE 1CC/29G (B-D INSULIN SYRINGE) 29G X 1/2" 1 ML MISC Use to inject insulin 3 times daily. Qty: 100 each, Refills: 3    methocarbamol (ROBAXIN) 500 MG tablet Take 1 tablet (500 mg total) by mouth every 6 (six) hours as needed for muscle spasms. Qty: 60 tablet, Refills: 0    OXYGEN Inhale 2 L into the lungs continuous.      STOP taking these medications     esomeprazole (NEXIUM) 40 MG capsule        Allergies  Allergen Reactions  . Acetaminophen Other (See Comments)    Cirrhosis of liver  . Morphine Other (See Comments)    REACTION: Lowers BP  . Morphine And Related Other (See Comments)    Blood pressure drops   . Other Other (See Comments)    AGENT:  Per pt, CANNOT TAKE ANY FORM OF BLOOD THINNER, due to cirrhosis of the liver  . Penicillins Anaphylaxis and Rash  . Trazodone And Nefazodone Other (See Comments)    Cardiac arrythmia - DO NOT USE  . Codeine Phosphate Other (See Comments)    REACTION: Stomach cramps  . Hydrocodone-Acetaminophen Other (See Comments)    REACTION: hallucinations  . Cephalexin Swelling and Rash  . Hydrocodone-Acetaminophen Other (See Comments)    unknown   Follow-up Information    Follow up with FRY,STEPHEN A, MD. Schedule an appointment as soon as possible for a visit in 1 week.   Specialty:  Family Medicine   Why:  for post hospitalization follow up    Contact information:   [place] [phone]        The results of significant diagnostics from this hospitalization (including imaging, microbiology, ancillary and laboratory) are listed below for reference.    Significant Diagnostic Studies: Ct Abdomen Pelvis Wo Contrast  04/15/2014   CLINICAL DATA:  Abdominal pain acute. Leukocytosis. History of cervical cancer and lung cancer.  EXAM: CT ABDOMEN AND PELVIS WITHOUT CONTRAST  TECHNIQUE: Multidetector CT imaging of the abdomen and pelvis was performed following the standard protocol without IV contrast.  COMPARISON:  CT abdomen pelvis 01/26/2014, 03/04/2014  FINDINGS: Mild left lower  lobe scarring or atelectasis. Lungs otherwise clear. Cardiac enlargement. Patient has a transvenous pacemaker.  The liver is enlarged. The liver has irregular contours consistent with cirrhosis. The spleen has been removed. No focal liver lesion. Gallbladder has been removed. Small hypodensity uncinate process of the pancreas better seen on the prior contrast-enhanced study. No evidence of pancreatitis.  The kidneys show no obstruction or mass. No renal calculi. Urinary bladder normal.  Postop hysterectomy. Loculated cystic mass in the pelvis behind the bladder measuring 8 x 4 cm. This has slowly grown since the prior study of 01/26/2014. No enhancing soft tissue component. This could be loculated peritoneal fluid however cystic mass of the ovary is not excluded and further imaging evaluation is warranted.  Moderate thickening of the cecum and right colon suggestive of colitis. This was not present on the prior study. The left colon is not thickened. Negative for bowel obstruction.  Ventral hernia left lower quadrant containing and ostomy is well as small bowel loops.  IMPRESSION: Cirrhosis of the liver  Enlarging loculated cystic mass in the pelvis behind the bladder measuring 8 x 4 cm. Abscess not considered likely given lack of soft  tissue component. This could be loculated peritoneal fluid or a cystic mass of the ovary. Pelvic ultrasound recommended for further evaluation  Moderate thickening of the cecum and right colon suggestive of colitis. This has developed since the prior study. Negative for bowel obstruction.   Electronically Signed   By: Franchot Gallo M.D.   On: 04/15/2014 14:17   Ct Abdomen Pelvis Wo Contrast  04/12/2014   CLINICAL DATA:  Generalized abdominal pain.  EXAM: CT ABDOMEN AND PELVIS WITHOUT CONTRAST  TECHNIQUE: Multidetector CT imaging of the abdomen and pelvis was performed following the standard protocol without IV contrast.  COMPARISON:  Radiographs obtained yesterday.  CT dated 03/04/2014.  FINDINGS: Cholecystectomy clips. Left lower quadrant colostomy. Surgical absence of a portion of the sigmoid colon. Small bowel to rectal anastomosis. No bowel dilatation. No enlarged lymph nodes.  A previously demonstrated 3.0 cm left inferior pelvic cyst associated with the left ovary currently measures 4.5 cm in maximum corresponding diameter. A previously demonstrated 3.3 cm right pelvic cyst associated with an the right ovary or ovarian remnant currently measures 4.9 cm in maximum diameter and has a moderately thickened internal septation or represents 2 adjacent cysts. The patient reportedly has had a right oophorectomy.  Surgically absent uterus. Atheromatous arterial calcifications. Surgically absent spleen. The caudate lobe and lateral segment of the left lobe of the liver are enlarged in the liver contours are mildly irregular. Surgically absent spleen. Unremarkable non contrasted appearance of the pancreas, adrenal glands and kidneys. No significant change in a 2 mm nodule in the right lower lobe on image 4. This has not changed significantly since 11/06/2012 and was not seen prior to that time. Mild lower thoracic spine degenerative changes.  IMPRESSION: 1. No acute abnormality. 2. Stable changes of cirrhosis of the  liver. 3. Further enlargement in probable bilateral ovarian cysts despite a reported history of right oophorectomy. Hydrosalpinx is also a possibility. Further evaluation with elective pelvic ultrasound is recommended. 4. Stable 2 mm right lower lobe nodule.   Electronically Signed   By: Claudie Revering M.D.   On: 04/12/2014 12:58   Ct Head Wo Contrast  04/12/2014   CLINICAL DATA:  Progressive weakness and fatigue for 6 months. Elevated ammonia levels. Initial encounter.  EXAM: CT HEAD WITHOUT CONTRAST  TECHNIQUE: Contiguous axial images were obtained from  the base of the skull through the vertex without intravenous contrast.  COMPARISON:  CT of the head performed 04/21/2013  FINDINGS: There is no evidence of acute infarction, mass lesion, or intra- or extra-axial hemorrhage on CT.  Prominence of the sulci suggests mild cortical volume loss. Mild cerebellar atrophy is noted.  The brainstem and fourth ventricle are within normal limits. The basal ganglia are unremarkable in appearance. The cerebral hemispheres demonstrate grossly normal gray-white differentiation. No mass effect or midline shift is seen.  There is no evidence of fracture; visualized osseous structures are unremarkable in appearance. The orbits are within normal limits. A mucus retention cyst or polyp is noted at the right maxillary sinus. The remaining paranasal sinuses and mastoid air cells are well-aerated. No significant soft tissue abnormalities are seen.  IMPRESSION: 1. No acute intracranial pathology seen on CT. 2. Mild cortical volume loss noted. 3. Mucus retention cyst or polyp at the right maxillary sinus.   Electronically Signed   By: Garald Balding M.D.   On: 04/12/2014 00:17   US Transvaginal Non-ob  04/16/2014   CLINICAL DATA:  Adnexal mass.  EXAM: TRANSABDOMINAL AND TRANSVAGINAL ULTRASOUND OF PELVIS  TECHNIQUE: Both transabdominal and transvaginal ultrasound examinations of the pelvis were performed. Transabdominal technique was  performed for global imaging of the pelvis including uterus, ovaries, adnexal regions, and pelvic cul-de-sac.  COMPARISON:  CT scans dated 04/15/2014 and 04/12/2014 and 03/04/2014 and 01/26/2014 and pelvic ultrasound dated 03/23/2014  FINDINGS: Uterus  Removed  Right ovary  Not visualized.  Left ovary  Not visualized.  Other findings  Complex right adnexal cystic mass measuring 9.2 x 7.8 x 4.4 cm, unchanged since the prior ultrasound exam.  No free fluid.  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   Electronically Signed   By: Lorriane Shire M.D.   On: 04/16/2014 08:36   US Transvaginal Non-ob  03/23/2014   CLINICAL DATA:  RIGHT ovarian mass 3.3 cm diameter on recent CT, past history of cervical cancer, lung cancer, hysterectomy  EXAM: TRANSABDOMINAL AND TRANSVAGINAL ULTRASOUND OF PELVIS  TECHNIQUE: Both transabdominal and transvaginal ultrasound examinations of the pelvis were performed. Transabdominal technique was performed for global imaging of the pelvis including uterus, ovaries, adnexal regions, and pelvic cul-de-sac. It was necessary to proceed with endovaginal exam following the transabdominal exam to visualize the ovaries.  COMPARISON:  CT abdomen and pelvis 03/04/2014  FINDINGS: Uterus  Surgically absent  Endometrium  N/A  Right ovary  Normal appearing RIGHT ovary not identified. In the RIGHT adnexa, a complex cystic mass with multiple irregular septations and thickening is identified measuring 5.0 x 4.4 x 7.4 cm in size. 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  Left ovary  Not identified on either transabdominal or endovaginal imaging question related to obscuration by bowel, surgical absence or atrophy. LEFT ovary was not identified definitively on preceding CT.  Other findings  No free pelvic fluid. Visualized portion of urinary bladder unremarkable.  IMPRESSION: Surgical absence of uterus.   Nonvisualization of LEFT ovary.  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  Gynecologic evaluation recommended.  These results will be called to the ordering clinician or representative by the Radiologist Assistant, and communication documented in the PACS or zVision Dashboard.   Electronically Signed   By: Lavonia Dana M.D.   On: 03/23/2014 17:00  US Pelvis Complete  04/16/2014   CLINICAL DATA:  Adnexal mass.  EXAM: TRANSABDOMINAL AND TRANSVAGINAL ULTRASOUND OF PELVIS  TECHNIQUE: Both transabdominal and transvaginal ultrasound examinations of the pelvis were performed. Transabdominal technique was performed for global imaging of the pelvis including uterus, ovaries, adnexal regions, and pelvic cul-de-sac.  COMPARISON:  CT scans dated 04/15/2014 and 04/12/2014 and 03/04/2014 and 01/26/2014 and pelvic ultrasound dated 03/23/2014  FINDINGS: Uterus  Removed  Right ovary  Not visualized.  Left ovary  Not visualized.  Other findings  Complex right adnexal cystic mass measuring 9.2 x 7.8 x 4.4 cm, unchanged since the prior ultrasound exam.  No free fluid.  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   Electronically Signed   By: Lorriane Shire M.D.   On: 04/16/2014 08:36   US Pelvis Complete  03/23/2014   CLINICAL DATA:  RIGHT ovarian mass 3.3 cm diameter on recent CT, past history of cervical cancer, lung cancer, hysterectomy  EXAM: TRANSABDOMINAL AND TRANSVAGINAL ULTRASOUND OF PELVIS  TECHNIQUE: Both transabdominal and transvaginal ultrasound examinations of the pelvis were performed. Transabdominal technique was performed for global imaging of the pelvis including uterus, ovaries, adnexal regions, and pelvic cul-de-sac. It was necessary to proceed with endovaginal exam following the transabdominal exam to visualize the ovaries.   COMPARISON:  CT abdomen and pelvis 03/04/2014  FINDINGS: Uterus  Surgically absent  Endometrium  N/A  Right ovary  Normal appearing RIGHT ovary not identified. In the RIGHT adnexa, a complex cystic mass with multiple irregular septations and thickening is identified measuring 5.0 x 4.4 x 7.4 cm in size. 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  Left ovary  Not identified on either transabdominal or endovaginal imaging question related to obscuration by bowel, surgical absence or atrophy. LEFT ovary was not identified definitively on preceding CT.  Other findings  No free pelvic fluid. Visualized portion of urinary bladder unremarkable.  IMPRESSION: Surgical absence of uterus.  Nonvisualization of LEFT ovary.  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  Gynecologic evaluation recommended.  These results will be called to the ordering clinician or representative by the Radiologist Assistant, and communication documented in the PACS or zVision Dashboard.   Electronically Signed   By: Lavonia Dana M.D.   On: 03/23/2014 17:00   Ct Abdomen Pelvis W Contrast  04/19/2014   CLINICAL DATA:  Colitis eval for perforated colon Cirrhosis, hernia repair, splecectomy, hyst GB appy, LUL removed. Lung ca dx 2004. Cervical ca. Known pelvic mass.  EXAM: CT ABDOMEN AND PELVIS WITH CONTRAST  TECHNIQUE: Multidetector CT imaging of the abdomen and pelvis was performed using the standard protocol following bolus administration of intravenous contrast.  CONTRAST:  183mL OMNIPAQUE IOHEXOL 300 MG/ML  SOLN  COMPARISON:  04/15/2014, 04/12/2014 and 03/04/2014  FINDINGS: Lung bases are within normal. Pacer leads are present over the heart which is mildly enlarged and unchanged.  Abdominal images demonstrate evidence of a previous cholecystectomy and splenectomy. Mild nodular contour of the liver compatible with history of cirrhosis. Stable  1 cm cystic focus over the pancreatic head and stable 9 mm cystic focus over the pancreatic body. Adrenal glands are unremarkable. Appendix is not seen. There is calcified plaque over the abdominal aorta and iliac arteries.  Kidneys are normal in size without significant hydronephrosis or nephrolithiasis. The ureters are unremarkable.  There are  2 small stable hernias over the epigastric region with the larger just left of midline containing a small amount of fluid. There are postsurgical changes of the abdominal wall with diastases of the rectus abdominus muscles unchanged. Colostomy over the left lower quadrant is unchanged.  There are several contrast filled dilated small bowel loops which can be followed to the anterior midline of the mid abdomen as it is difficult to follow the small bowel beyond this point as contrast is not present distal to this point. The colostomy defect in the left lower quadrant does contained a few loops of small bowel. There may be developing small bowel obstruction due to adhesions over the anterior mid abdomen versus related to the colostomy site. No free peritoneal air. There has been interval resolution of the previously noted moderate wall thickening involving the cecum and ascending colon.  There is subcutaneous edema over the abdominal wall over the lower abdomen/pelvis. No change in patient's known pelvic mass thought to be ovarian origin. Evidence of a Hartmann's pouch. Bladder is unremarkable. Remainder the exam is unchanged.  IMPRESSION: Resolution of previously noted significant wall thickening of the ascending colon/cecum. New dilated small bowel loops measuring up to 5 cm in diameter which can be followed to the anterior midline of the mid abdomen as it is difficult to follow the small bowel beyond this point and no significant contrast seen distal to this point. This likely represents developing small-bowel obstruction which may be due to adhesions over the anterior  midline of the mid abdomen although may be related to patient's colostomy which contains several small bowel loops herniating through the colostomy defect.  Stable known pelvic mass thought to be ovarian in origin as described on previous CT and ultrasound.  Two small stable pancreatic cystic foci. Recommend followup MRI one year. This recommendation follows ACR consensus guidelines: Managing Incidental Findings on Abdominal CT: White Paper of the ACR Incidental Findings Committee. J Am Coll Radiol 2010;7:754-773  Multiple other chronic stable findings as described.  These results will be called to the ordering clinician or representative by the Radiologist Assistant, and communication documented in the PACS or zVision Dashboard.   Electronically Signed   By: Marin Olp M.D.   On: 04/19/2014 17:03   Dg Abd Acute W/chest  04/20/2014   CLINICAL DATA:  Abdominal pain extending from the upper abdomen to the left lower quadrant. Prior diagnosis of small bowel obstruction  EXAM: ACUTE ABDOMEN SERIES (ABDOMEN 2 VIEW & CHEST 1 VIEW)  COMPARISON:  CT 04/19/2014  FINDINGS: Left pacer is in place with leads in the right atrium and right ventricle, unchanged. Chronic interstitial prominence throughout the lungs without confluent airspace opacity or effusion. Heart is normal size.  Gas within mildly prominent bowel loops centrally in the abdomen, some which appear to represent colon. There likely are mildly dilated small bowel loops as well, similar to recent CT. No free air. No organomegaly. Prior cholecystectomy. No acute bony abnormality or suspicious calcification.  IMPRESSION: Mildly prominent mid abdominal bowel loops, likely both large and small bowel. Overall small bowel dilatation likely similar to recent CT.   Electronically Signed   By: Rolm Baptise M.D.   On: 04/20/2014 15:51   Dg Abd Acute W/chest  04/11/2014   CLINICAL DATA:  Upper abdominal pain for 2 months, worse tonight.  EXAM: ACUTE ABDOMEN SERIES  (ABDOMEN 2 VIEW & CHEST 1 VIEW)  COMPARISON:  03/04/2014  FINDINGS: The upright view the chest demonstrates moderate unchanged cardiomegaly.  Moderate vascular congestion is present, without alveolar edema or infiltrate. Supine and left lateral decubitus views of the abdomen are negative for obstruction or perforation.  IMPRESSION: Negative abdominal radiographs. Moderate vascular congestion. Unchanged cardiomegaly.   Electronically Signed   By: Andreas Newport M.D.   On: 04/11/2014 23:57    Microbiology: Recent Results (from the past 240 hour(s))  Urine culture     Status: None   Collection Time: 04/12/14  2:02 AM  Result Value Ref Range Status   Specimen Description URINE, RANDOM  Final   Special Requests NONE  Final   Colony Count   Final    25,000 COLONIES/ML Performed at [facility]    Culture   Final    Multiple bacterial morphotypes present, none predominant. Suggest appropriate recollection if clinically indicated. Performed at [facility]    Report Status 04/13/2014 FINAL  Final  MRSA PCR Screening     Status: None   Collection Time: 04/12/14  3:44 AM  Result Value Ref Range Status   MRSA by PCR NEGATIVE NEGATIVE Final    Comment:        The GeneXpert MRSA Assay (FDA approved for NASAL specimens only), is one component of a comprehensive MRSA colonization surveillance program. It is not intended to diagnose MRSA infection nor to guide or monitor treatment for MRSA infections.   Clostridium Difficile by PCR     Status: None   Collection Time: 04/14/14 11:00 AM  Result Value Ref Range Status   C difficile by pcr NEGATIVE NEGATIVE Final  Clostridium Difficile by PCR     Status: None   Collection Time: 04/17/14  8:19 AM  Result Value Ref Range Status   C difficile by pcr NEGATIVE NEGATIVE Final     Labs: Basic Metabolic Panel:  Recent Labs Lab 04/16/14 0525 04/17/14 0542 04/18/14 0704 04/19/14 0420 04/20/14 0536  NA 129* 131* 128* 135 137   K 4.0 4.5 4.7 4.5 4.4  CL 92* 98 99 102 100  CO2 27 23 22 24 25   GLUCOSE 204* 230* 222* 204* 146*  BUN 22 23 27* 28* 27*  CREATININE 1.25* 1.19* 1.23* 1.22* 1.17*  CALCIUM 9.5 9.2 9.3 9.7 9.9   Liver Function Tests:  Recent Labs Lab 04/15/14 0528 04/18/14 0704  AST 33 27  ALT 26 22  ALKPHOS 114 106  BILITOT 0.5 0.4  PROT 7.3 7.0  ALBUMIN 3.6 3.3*   No results for input(s): LIPASE, AMYLASE in the last 168 hours. No results for input(s): AMMONIA in the last 168 hours. CBC:  Recent Labs Lab 04/15/14 0528 04/16/14 0525 04/17/14 0542 04/18/14 0704 04/19/14 0420 04/20/14 0536 04/21/14 0533  WBC 17.3* 17.2* 14.0* 17.6* 16.9* 17.5* 14.3*  NEUTROABS 9.6* 8.9* 7.1  --   --   --   --   HGB 9.1* 9.2* 8.5* 9.2* 8.6* 9.5* 9.4*  HCT 29.3* 28.5* 26.2* 29.0* 26.9* 30.2* 29.6*  MCV 91.6 90.8 91.3 91.2 91.2 91.0 90.5  PLT 415* 374 377 359 362 390 380   Cardiac Enzymes: No results for input(s): CKTOTAL, CKMB, CKMBINDEX, TROPONINI in the last 168 hours. BNP: BNP (last 3 results)  Recent Labs  03/04/14 2100 04/12/14 0710  BNP 35.8 311.9*    ProBNP (last 3 results)  Recent Labs  07/16/13 0650 09/02/13 1803 03/30/14 1230  PROBNP 933.6* 558.0* 71.0    CBG:  Recent Labs Lab 04/20/14 1142 04/20/14 1659 04/20/14 2136 04/21/14 0724 04/21/14 1156  GLUCAP 214* 121* 150*  115* 169*       Signed:  Elida Harbin  [facility] 04/21/2014, 12:42 PM

## 2014-04-21 NOTE — Progress Notes (Signed)
Spoke with pt who is active with [facility] with:HHRN, HHPT and HHNA.  Pt states, "that is will not go to a SNF.  I am going home with [facility]".   Will need and order for resumption of HHRN, HHPT and HHNA please. Referral given to  [place], [place] [phone] with [facility], Fax [phone].

## 2014-04-22 NOTE — Progress Notes (Signed)
[facility] received phone call from Joetta Manners [phone] requesting assistance with preauthorization for Xifaxin for patient.  EDCM placed call to Dr. Clementeen Graham for assist.  Per Levada Dy, preauthorization form sent to unit and unit RN speaking to Dr. Glean Salen.

## 2014-04-22 NOTE — Progress Notes (Signed)
Pt called hospital back requesting help in getting Xifaxan filled at her pharmacy.  Pt stated that the pharmacy told her she needed authorization in order to get medication.  CN called [facility] who faxed authorization request form.  CN called [phone] and spoke with representative to get med authorization.  Representative was able to assist with a one time authorization. CN called patient back and informed her that she has received authorization and could pick her prescription up.    [name] A

## 2014-04-25 ENCOUNTER — Telehealth: Payer: Self-pay | Admitting: Gastroenterology

## 2014-04-25 NOTE — Telephone Encounter (Signed)
Discharge summary states "Follow-up with Dr. [name] in 4-6 weeks. She should be on rifaximin twice a day and lactulose bid ( titrate to at least 2 BM daily), indefinitely"

## 2014-04-26 NOTE — Telephone Encounter (Signed)
Appointment scheduled for the patient. She has a 30 day supply of the Xifaxan and then her insurance will require a PA before she can obtain any refills. PA to [facility] submitted.

## 2014-04-28 ENCOUNTER — Ambulatory Visit (INDEPENDENT_AMBULATORY_CARE_PROVIDER_SITE_OTHER): Payer: Medicare Other | Admitting: Family Medicine

## 2014-04-28 ENCOUNTER — Other Ambulatory Visit: Payer: Self-pay | Admitting: Family Medicine

## 2014-04-28 ENCOUNTER — Encounter: Payer: Self-pay | Admitting: Family Medicine

## 2014-04-28 ENCOUNTER — Telehealth: Payer: Self-pay

## 2014-04-28 VITALS — BP 140/60 | HR 77 | Temp 98.6°F | Wt 218.0 lb

## 2014-04-28 DIAGNOSIS — A09 Infectious gastroenteritis and colitis, unspecified: Secondary | ICD-10-CM

## 2014-04-28 DIAGNOSIS — I10 Essential (primary) hypertension: Secondary | ICD-10-CM | POA: Diagnosis not present

## 2014-04-28 DIAGNOSIS — D72829 Elevated white blood cell count, unspecified: Secondary | ICD-10-CM

## 2014-04-28 DIAGNOSIS — E1140 Type 2 diabetes mellitus with diabetic neuropathy, unspecified: Secondary | ICD-10-CM | POA: Diagnosis not present

## 2014-04-28 DIAGNOSIS — IMO0002 Reserved for concepts with insufficient information to code with codable children: Secondary | ICD-10-CM

## 2014-04-28 DIAGNOSIS — K7290 Hepatic failure, unspecified without coma: Secondary | ICD-10-CM | POA: Diagnosis not present

## 2014-04-28 DIAGNOSIS — R600 Localized edema: Secondary | ICD-10-CM

## 2014-04-28 DIAGNOSIS — E1165 Type 2 diabetes mellitus with hyperglycemia: Secondary | ICD-10-CM

## 2014-04-28 DIAGNOSIS — K7682 Hepatic encephalopathy: Secondary | ICD-10-CM

## 2014-04-28 DIAGNOSIS — B373 Candidiasis of vulva and vagina: Secondary | ICD-10-CM

## 2014-04-28 DIAGNOSIS — B3731 Acute candidiasis of vulva and vagina: Secondary | ICD-10-CM

## 2014-04-28 LAB — CBC WITH DIFFERENTIAL/PLATELET
BASOS PCT: 0.7 % (ref 0.0–3.0)
Basophils Absolute: 0.1 10*3/uL (ref 0.0–0.1)
Eosinophils Absolute: 0.7 10*3/uL (ref 0.0–0.7)
Eosinophils Relative: 4 % (ref 0.0–5.0)
HCT: 28.3 % — ABNORMAL LOW (ref 36.0–46.0)
Hemoglobin: 9.3 g/dL — ABNORMAL LOW (ref 12.0–15.0)
Lymphocytes Relative: 18.9 % (ref 12.0–46.0)
Lymphs Abs: 3.4 10*3/uL (ref 0.7–4.0)
MCHC: 32.9 g/dL (ref 30.0–36.0)
MCV: 86.6 fl (ref 78.0–100.0)
Monocytes Absolute: 2.9 10*3/uL — ABNORMAL HIGH (ref 0.1–1.0)
Monocytes Relative: 16.2 % — ABNORMAL HIGH (ref 3.0–12.0)
NEUTROS PCT: 60.2 % (ref 43.0–77.0)
Neutro Abs: 10.9 10*3/uL — ABNORMAL HIGH (ref 1.4–7.7)
Platelets: 393 10*3/uL (ref 150.0–400.0)
RBC: 3.27 Mil/uL — AB (ref 3.87–5.11)
RDW: 15.8 % — ABNORMAL HIGH (ref 11.5–15.5)

## 2014-04-28 MED ORDER — FLUCONAZOLE 150 MG PO TABS
150.0000 mg | ORAL_TABLET | Freq: Once | ORAL | Status: DC
Start: 2014-04-28 — End: 2014-05-23

## 2014-04-28 NOTE — Telephone Encounter (Signed)
Monitor closely for any fever.  If none, repeat CBC by early next week.  Follow up promptly for any fever or new symptoms.

## 2014-04-28 NOTE — Progress Notes (Signed)
Subjective:    Patient ID: [name], female    DOB: Jan 28, 1951, 64 y.o.   MRN: [id number]  HPI Patient seen for hospital follow-up in the absence of her primary physician. She has a long complicated medical history with remote history of lung cancer, hypertension, poorly controlled type 2 diabetes, COPD (oxygen dependent), GERD, diastolic heart failure, history of AV block with current pacemaker, and history of nonalcoholic cirrhosis. She was admitted on 04/11/2014 and discharged on 3 /24/2016. She was admitted with confusion and hepatic encephalopathy.  Hepatic encephalopathy. Felt some contribution from constipation related to chronic opioid use. She was placed on combination of lactulose and rifaximin per GI and symptoms were improved within a couple of days. Recommend that she stay on this indefinitely. She has follow-up with GI scheduled  Concern for acute infectious colitis. Patient had negative C. difficile. Treated empirically with Cipro and Flagyl. Repeat CT scan 3/22 showed resolution of colitis. She had persistent leukocytosis felt secondary to colitis. Discharge white blood count 14.3 thousand with recommended CBC at follow-up  Patient has complex multiseptated pelvic mass followed by GYN. She has scheduled follow-up in a couple months. Uncontrolled type 2 diabetes with last A1c 9.6%. She is followed by endocrinology regarding that.  No hypoglycemia since discharge.  Was apparently given Levemir in hospital but does not take as outpatient.  Pruritic rash vaginal area. Minimal discharge. No dysuria. Did not try any creams.  Bilateral leg edema which is chronic. Worsened since hospitalization. Chronic dyspnea is stable. She takes high-dose furosemide 80 mg twice daily. No support hose use. Symptoms worse after dependency  Past Medical History  Diagnosis Date  . Cirrhosis   . GERD (gastroesophageal reflux disease)   . Cervical disc syndrome     trouble turnng neck at times    . Chronic respiratory failure   . Diverticulitis of colon   . Perforation of colon   . IBS (irritable bowel syndrome)   . Chronic lower GI bleeding   . Overactive bladder   . Thrombocytopenia     sees Dr. Julien Nordmann   . Splenomegaly   . Depression   . Hypertension   . Asthma   . Heart murmur   . Hyperlipidemia   . COPD (chronic obstructive pulmonary disease)     sees Dr. Gwenette Greet   . Colostomy care   . Hypercalcemia   . CHF (congestive heart failure)   . Pacemaker 12/14/2013  . 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 Cervical cancer many years ago  . Complication of anesthesia     woke up during colonscopy and endoscopy in past  . History of blood transfusion "several"    "bleeding via ostomy" (01/04/2014)  . On home oxygen therapy     "2L; 24/7" (01/04/2014)  . Pneumonia "1-2 times"  . Chronic bronchitis "several times"  . Sleep apnea 2012    mild, no cpap needed  . Type II diabetes mellitus     sees Dr. Cruzita Lederer   . Chronic disease anemia     sees Dr. Julien Nordmann, due to chronic disease and GI losses   . History of stomach ulcers   . Migraine     "last one was several years ago" (01/04/2014)  . Arthritis     "tailbone; hands; legs" (01/04/2014)  . Anxiety   . Pericardial effusion 2007, 2015.   [name] Chronic abdominal pain     IBS  . Cirrhosis of liver  stage 4   Past Surgical History  Procedure Laterality Date  . Colostomy  11/06/2005  . Lung removal, partial Left 2004    upper lobe removed  . Left colectomy  11/06/2005    Hartmann resection of sigmoid colon and end colostomy.  . Esophagogastroduodenoscopy  03/19/2011    Procedure: ESOPHAGOGASTRODUODENOSCOPY (EGD);  Surgeon: [name], MD;  Location: [facility];  Service: Endoscopy;  Laterality: N/A;  . Colonoscopy  03/19/2011    Procedure: COLONOSCOPY;  Surgeon: [name], MD;  Location: [facility];  Service: Endoscopy;  Laterality: N/A;  . Givens capsule study  03/20/2011     Procedure: GIVENS CAPSULE STUDY;  Surgeon: [name], MD;  Location: [facility];  Service: Endoscopy;  Laterality: N/A;  . Small bowel obstruction repair  March 2012  . Umbilical hernia repair  March 2012  . Splenectomy, total N/A 02/24/2013    Procedure: SPLENECTOMY;  Surgeon: Harl Bowie, MD;  Location: [facility];  Service: General;  Laterality: N/A;  . Orif patella Left 04/21/2013    Procedure: OPEN REDUCTION INTERNAL (ORIF) FIXATION PATELLA;  Surgeon: Augustin Schooling, MD;  Location: [place];  Service: Orthopedics;  Laterality: Left;  . Pacemaker insertion  2015  . Esophagogastroduodenoscopy (egd) with propofol N/A 11/02/2013    Procedure: ESOPHAGOGASTRODUODENOSCOPY (EGD) WITH PROPOFOL;  Surgeon: [name], MD;  Location: [facility];  Service: Endoscopy;  Laterality: N/A;  EGD with APC  . Hot hemostasis N/A 11/02/2013    Procedure: HOT HEMOSTASIS (ARGON PLASMA COAGULATION/BICAP);  Surgeon: [name], MD;  Location: [facility];  Service: Endoscopy;  Laterality: N/A;  . Colon surgery    . Hernia repair    . Fracture surgery    . Abdominal hysterectomy    . Tubal ligation    . Tonsillectomy  1959  . Appendectomy  1962  . Cholecystectomy    . Cardiac catheterization  1967  . Permanent pacemaker insertion N/A 09/07/2013    Procedure: PERMANENT PACEMAKER INSERTION;  Surgeon: Evans Lance, MD;  Location: [facility] CATH LAB;  Service: Cardiovascular;  Laterality: N/A;  . Tee without cardioversion N/A 01/06/2014    Procedure: TRANSESOPHAGEAL ECHOCARDIOGRAM (TEE);  Surgeon: Lelon Perla, MD;  Location: [facility] ENDOSCOPY;  Service: Cardiovascular;  Laterality: N/A;  . Colostomy  11/06/2005    OPERATIVE REPORT    reports that she quit smoking about 12 years ago. Her smoking use included Cigarettes. She has a 70 pack-year smoking history. She has never used smokeless tobacco. She reports that she does not drink alcohol or use illicit drugs. family history includes Cervical cancer in  her maternal grandmother; Cirrhosis in her father; Coronary artery disease in an other family member; Diabetes type II in her mother and another family member; Heart attack in her mother and sister; Hypertension in her daughter, father, sister, and son; Pancreatic cancer in her mother. There is no history of Stroke. Allergies  Allergen Reactions  . Acetaminophen Other (See Comments)    Cirrhosis of liver  . Morphine Other (See Comments)    REACTION: Lowers BP  . Morphine And Related Other (See Comments)    Blood pressure drops   . Other Other (See Comments)    AGENT:  Per pt, CANNOT TAKE ANY FORM OF BLOOD THINNER, due to cirrhosis of the liver  . Penicillins Anaphylaxis and Rash  . Trazodone And Nefazodone Other (See Comments)    Cardiac arrythmia - DO NOT USE  . Codeine Phosphate Other (See Comments)  REACTION: Stomach cramps  . Hydrocodone-Acetaminophen Other (See Comments)    REACTION: hallucinations  . Cephalexin Swelling and Rash  . Hydrocodone-Acetaminophen Other (See Comments)    unknown        Review of Systems  Constitutional: Positive for fatigue. Negative for fever and chills.  Respiratory: Positive for shortness of breath. Negative for cough.   Cardiovascular: Positive for leg swelling. Negative for chest pain.  Gastrointestinal: Negative for nausea, vomiting and abdominal pain.  Endocrine: Negative for polyuria.  Genitourinary: Negative for dysuria and vaginal discharge.  Neurological: Negative for dizziness.  Psychiatric/Behavioral: Negative for confusion.       Objective:   Physical Exam  Constitutional: She is oriented to person, place, and time. She appears well-developed and well-nourished.  Neck: Neck supple.  Cardiovascular: Normal rate and regular rhythm.   Pulmonary/Chest: Effort normal and breath sounds normal. No respiratory distress. She has no wheezes. She has no rales.  Abdominal: Soft. There is no tenderness.  Musculoskeletal: She exhibits  edema.  She has moderate nonpitting edema legs bilaterally  Neurological: She is alert and oriented to person, place, and time.  Skin:  Patient has some nonspecific mild erythema labia majora bilaterally. No visible discharge  Psychiatric: She has a normal mood and affect. Her behavior is normal.          Assessment & Plan:  #1 recent hepatic encephalopathy symptomatically stable on rifaximin and lactulose. Continue follow-up with GI scheduled #2 possible recent acute infectious colitis symptomatically stable. Persistent leukocytosis during hospitalization. Recheck CBC.  No fever or progressive abdominal symptoms. #3 history of complex multiseptated pelvic mass followed by GYN #4 uncontrolled type 2 diabetes. Patient currently stable with home regimen of regular insulin 3 times daily and followed by endocrinology #5 vaginal pruritus. Presumptive diagnosis of yeast vaginitis. Fluconazole 150 mg 1 dose #6 chronic bilateral leg edema likely multi factorial and related to inactivity, diastolic heart failure, venous stasis. Continue furosemide. Consider compression stockings with prescription written

## 2014-04-28 NOTE — Telephone Encounter (Signed)
Pt is aware.  

## 2014-04-28 NOTE — Progress Notes (Signed)
Pre visit review using our clinic review tool, if applicable. No additional management support is needed unless otherwise documented below in the visit note. 

## 2014-04-28 NOTE — Telephone Encounter (Signed)
Critical lab WBC 18.1

## 2014-05-02 ENCOUNTER — Ambulatory Visit: Payer: Medicare Other | Admitting: Family Medicine

## 2014-05-02 ENCOUNTER — Other Ambulatory Visit (INDEPENDENT_AMBULATORY_CARE_PROVIDER_SITE_OTHER): Payer: Medicare Other

## 2014-05-02 DIAGNOSIS — D72829 Elevated white blood cell count, unspecified: Secondary | ICD-10-CM | POA: Diagnosis not present

## 2014-05-02 LAB — CBC WITH DIFFERENTIAL/PLATELET
BASOS ABS: 0.1 10*3/uL (ref 0.0–0.1)
BASOS PCT: 0.9 % (ref 0.0–3.0)
EOS ABS: 0.4 10*3/uL (ref 0.0–0.7)
EOS PCT: 3.3 % (ref 0.0–5.0)
HCT: 26.3 % — ABNORMAL LOW (ref 36.0–46.0)
Hemoglobin: 8.6 g/dL — ABNORMAL LOW (ref 12.0–15.0)
LYMPHS PCT: 15.6 % (ref 12.0–46.0)
Lymphs Abs: 1.9 10*3/uL (ref 0.7–4.0)
MCHC: 32.9 g/dL (ref 30.0–36.0)
MCV: 85.8 fl (ref 78.0–100.0)
Monocytes Absolute: 1.7 10*3/uL — ABNORMAL HIGH (ref 0.1–1.0)
Monocytes Relative: 13.8 % — ABNORMAL HIGH (ref 3.0–12.0)
NEUTROS PCT: 66.4 % (ref 43.0–77.0)
Neutro Abs: 8.2 10*3/uL — ABNORMAL HIGH (ref 1.4–7.7)
Platelets: 427 10*3/uL — ABNORMAL HIGH (ref 150.0–400.0)
RBC: 3.06 Mil/uL — AB (ref 3.87–5.11)
RDW: 16.5 % — ABNORMAL HIGH (ref 11.5–15.5)
WBC: 12.3 10*3/uL — AB (ref 4.0–10.5)

## 2014-05-09 ENCOUNTER — Telehealth: Payer: Self-pay | Admitting: Gastroenterology

## 2014-05-09 NOTE — Telephone Encounter (Signed)
New PA sent to [facility].Spoke with the patient. Her medicaid covers services after paid for by her primary insurance.

## 2014-05-09 NOTE — Telephone Encounter (Signed)
Patient was denied [employer]. I think it looks like you done her PA

## 2014-05-10 ENCOUNTER — Other Ambulatory Visit: Payer: Self-pay | Admitting: Family Medicine

## 2014-05-11 ENCOUNTER — Encounter: Payer: Self-pay | Admitting: Gastroenterology

## 2014-05-11 ENCOUNTER — Ambulatory Visit (INDEPENDENT_AMBULATORY_CARE_PROVIDER_SITE_OTHER): Payer: Medicare Other | Admitting: Gastroenterology

## 2014-05-11 VITALS — BP 144/30 | HR 64 | Temp 99.1°F | Ht 60.0 in | Wt 226.4 lb

## 2014-05-11 DIAGNOSIS — K7469 Other cirrhosis of liver: Secondary | ICD-10-CM

## 2014-05-11 DIAGNOSIS — K7460 Unspecified cirrhosis of liver: Secondary | ICD-10-CM | POA: Diagnosis not present

## 2014-05-11 NOTE — Assessment & Plan Note (Signed)
The patient's weakness may be related to anemia.  She's not grossly encephalopathic but she does complain of confusion.  She's having trouble pain for xifaxan.  Lactulose twice a day causes diarrhea.  Note weight gain since discharge raising the question of ascites.  Recommendations #1 abdominal ultrasound to assess for ascites; continue Lasix #2 repeat CBC #3 I will speak with the insurance company regarding coverage for xifaxan.  Continue lactulose 1-2 times a day

## 2014-05-11 NOTE — Progress Notes (Signed)
_                                                                                                                History of Present Illness:  [name] was hospitalized last month with hepatic encephalopathy and weakness.  At one point she was having diarrhea in a CT scan suggested colitis.  Symptoms subsided and follow-up CT scan was negative for bowel wall edema.  Her main complaint is difficulty breathing and weakness.  She has gained 15 pounds since discharge.  She continues to have intermittent bleeding or ostomy.   Past Medical History  Diagnosis Date  . Cirrhosis   . GERD (gastroesophageal reflux disease)   . Cervical disc syndrome     trouble turnng neck at times  . Chronic respiratory failure   . Diverticulitis of colon   . Perforation of colon   . IBS (irritable bowel syndrome)   . Chronic lower GI bleeding   . Overactive bladder   . Thrombocytopenia     sees Dr. Julien Nordmann   . Splenomegaly   . Depression   . Hypertension   . Asthma   . Heart murmur   . Hyperlipidemia   . COPD (chronic obstructive pulmonary disease)     sees Dr. Gwenette Greet   . Colostomy care   . Hypercalcemia   . CHF (congestive heart failure)   . Pacemaker 12/14/2013  . 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 Cervical cancer many years ago  . Complication of anesthesia     woke up during colonscopy and endoscopy in past  . History of blood transfusion "several"    "bleeding via ostomy" (01/04/2014)  . On home oxygen therapy     "2L; 24/7" (01/04/2014)  . Pneumonia "1-2 times"  . Chronic bronchitis "several times"  . Sleep apnea 2012    mild, no cpap needed  . Type II diabetes mellitus     sees Dr. Cruzita Lederer   . Chronic disease anemia     sees Dr. Julien Nordmann, due to chronic disease and GI losses   . History of stomach ulcers   . Migraine     "last one was several years ago" (01/04/2014)  . Arthritis     "tailbone; hands; legs" (01/04/2014)  . Anxiety   .  Pericardial effusion 2007, 2015.   [name] Chronic abdominal pain     IBS  . Cirrhosis of liver     stage 4   Past Surgical History  Procedure Laterality Date  . Colostomy  11/06/2005  . Lung removal, partial Left 2004    upper lobe removed  . Left colectomy  11/06/2005    Hartmann resection of sigmoid colon and end colostomy.  . Esophagogastroduodenoscopy  03/19/2011    Procedure: ESOPHAGOGASTRODUODENOSCOPY (EGD);  Surgeon: [name], MD;  Location: [facility];  Service: Endoscopy;  Laterality: N/A;  . Colonoscopy  03/19/2011    Procedure: COLONOSCOPY;  Surgeon: [name], MD;  Location:  [facility];  Service: Endoscopy;  Laterality: N/A;  . Givens capsule study  03/20/2011    Procedure: GIVENS CAPSULE STUDY;  Surgeon: [name], MD;  Location: [facility];  Service: Endoscopy;  Laterality: N/A;  . Small bowel obstruction repair  March 2012  . Umbilical hernia repair  March 2012  . Splenectomy, total N/A 02/24/2013    Procedure: SPLENECTOMY;  Surgeon: Harl Bowie, MD;  Location: [place];  Service: General;  Laterality: N/A;  . Orif patella Left 04/21/2013    Procedure: OPEN REDUCTION INTERNAL (ORIF) FIXATION PATELLA;  Surgeon: Augustin Schooling, MD;  Location: [place];  Service: Orthopedics;  Laterality: Left;  . Pacemaker insertion  2015  . Esophagogastroduodenoscopy (egd) with propofol N/A 11/02/2013    Procedure: ESOPHAGOGASTRODUODENOSCOPY (EGD) WITH PROPOFOL;  Surgeon: [name], MD;  Location: [facility];  Service: Endoscopy;  Laterality: N/A;  EGD with APC  . Hot hemostasis N/A 11/02/2013    Procedure: HOT HEMOSTASIS (ARGON PLASMA COAGULATION/BICAP);  Surgeon: [name], MD;  Location: [facility];  Service: Endoscopy;  Laterality: N/A;  . Colon surgery    . Hernia repair    . Fracture surgery    . Abdominal hysterectomy    . Tubal ligation    . Tonsillectomy  1959  . Appendectomy  1962  . Cholecystectomy    . Cardiac catheterization  1967  .  Permanent pacemaker insertion N/A 09/07/2013    Procedure: PERMANENT PACEMAKER INSERTION;  Surgeon: Evans Lance, MD;  Location: [facility];  Service: Cardiovascular;  Laterality: N/A;  . Tee without cardioversion N/A 01/06/2014    Procedure: TRANSESOPHAGEAL ECHOCARDIOGRAM (TEE);  Surgeon: Lelon Perla, MD;  Location: [facility];  Service: Cardiovascular;  Laterality: N/A;  . Colostomy  11/06/2005    OPERATIVE REPORT   family history includes Cervical cancer in her maternal grandmother; Cirrhosis in her father; Coronary artery disease in an other family member; Diabetes type II in her mother and another family member; Heart attack in her mother and sister; Hypertension in her daughter, father, sister, and son; Pancreatic cancer in her mother. There is no history of Stroke. Current Outpatient Prescriptions  Medication Sig Dispense Refill  . albuterol (PROVENTIL) (2.5 MG/3ML) 0.083% nebulizer solution Take 3 mLs (2.5 mg total) by nebulization every 4 (four) hours as needed for wheezing or shortness of breath. 75 mL 12  . albuterol-ipratropium (COMBIVENT) 18-103 MCG/ACT inhaler Inhale 2 puffs into the lungs every 4 (four) hours as needed for wheezing or shortness of breath.     [name] atorvastatin (LIPITOR) 20 MG tablet Take 1 tablet (20 mg total) by mouth every morning. 90 tablet 3  . diazepam (VALIUM) 5 MG tablet Take 1 tablet (5 mg total) by mouth 3 (three) times daily as needed. (Patient taking differently: Take 5 mg by mouth 3 (three) times daily as needed for anxiety. ) 90 tablet 5  . dicyclomine (BENTYL) 10 MG capsule Take 10 mg by mouth 3 (three) times daily.     [name] esomeprazole (NEXIUM) 40 MG capsule Take 40 mg by mouth daily.     . fluconazole (DIFLUCAN) 150 MG tablet Take 1 tablet (150 mg total) by mouth once. 1 tablet 0  . Fluticasone-Salmeterol (ADVAIR) 250-50 MCG/DOSE AEPB Inhale 1 puff into the lungs every 12 (twelve) hours. 761 each 3  . folic acid (FOLVITE) 1 MG tablet TAKE ONE  TABLET BY MOUTH ONCE DAILY IN THE EVENING 90 tablet 0  . furosemide (LASIX) 40 MG  tablet Take 2 tablets (80 mg total) by mouth 2 (two) times daily.    . hydrALAZINE (APRESOLINE) 25 MG tablet Take 1.5 tablets (37.5 mg total) by mouth 2 (two) times daily. 270 tablet 3  . insulin regular human CONCENTRATED (HUMULIN R) 500 UNIT/ML SOLN injection Inject under skin 0.33 mL before b'fast, 0.33 mL before lunch and 0.30 mL before dinner (Patient taking differently: Inject 0.3-0.33 Units into the skin 3 (three) times daily with meals. Inject under skin 0.33 mL before b'fast, 0.33 mL before lunch and 0.30 mL before dinner) 40 mL 2  . INSULIN SYRINGE 1CC/29G (B-D INSULIN SYRINGE) 29G X 1/2" 1 ML MISC Use to inject insulin 3 times daily. 100 each 3  . isosorbide dinitrate (ISORDIL) 20 MG tablet Take 1 tablet (20 mg total) by mouth 2 (two) times daily. 180 tablet 3  . lactulose (CEPHULAC) 10 G packet Take 1 packet (10 g total) by mouth 2 (two) times daily between meals as needed (titrate to at least 2 BMs daily). 60 each 0  . losartan (COZAAR) 50 MG tablet Take 1 tablet (50 mg total) by mouth daily. 30 tablet 11  . methocarbamol (ROBAXIN) 500 MG tablet Take 1 tablet (500 mg total) by mouth every 6 (six) hours as needed for muscle spasms. 60 tablet 0  . metoCLOPramide (REGLAN) 10 MG tablet Take 1 tablet (10 mg total) by mouth 3 (three) times daily. 270 tablet 3  . metolazone (ZAROXOLYN) 2.5 MG tablet Take 2.5 mg by mouth daily.     . Oxycodone HCl 20 MG TABS Take 1 tablet (20 mg total) by mouth every 3 (three) hours as needed (pain). 240 tablet 0  . OXYGEN Inhale 2 L into the lungs continuous.    . polyethylene glycol (MIRALAX / GLYCOLAX) packet Take 17 g by mouth every 12 (twelve) hours as needed for moderate constipation or severe constipation. 14 each 0  . potassium chloride (KLOR-CON 10) 10 MEQ tablet Take 1 tablet (10 mEq total) by mouth daily. 30 tablet 11  . promethazine (PHENERGAN) 25 MG tablet Take 1 tablet  (25 mg total) by mouth every 4 (four) hours as needed for nausea or vomiting. 120 tablet 2  . rifaximin (XIFAXAN) 550 MG TABS tablet Take 1 tablet (550 mg total) by mouth 2 (two) times daily. 60 tablet 0  . saccharomyces boulardii (FLORASTOR) 250 MG capsule Take 1 capsule (250 mg total) by mouth 2 (two) times daily. 60 capsule 0   No current facility-administered medications for this visit.   Allergies as of 05/11/2014 - Review Complete 05/11/2014  Allergen Reaction Noted  . Acetaminophen Other (See Comments) 06/23/2013  . Morphine Other (See Comments) 09/15/2006  . Morphine and related Other (See Comments) 11/27/2013  . Other Other (See Comments) 02/10/2013  . Penicillins Anaphylaxis and Rash 06/03/2006  . Trazodone and nefazodone Other (See Comments) 09/03/2013  . Codeine phosphate Other (See Comments) 06/03/2006  . Hydrocodone-acetaminophen Other (See Comments) 06/03/2006  . Cephalexin Swelling and Rash 06/03/2006  . Hydrocodone-acetaminophen Other (See Comments) 06/23/2013    reports that she quit smoking about 12 years ago. Her smoking use included Cigarettes. She has a 70 pack-year smoking history. She has never used smokeless tobacco. She reports that she does not drink alcohol or use illicit drugs.   Review of Systems: Pertinent positive and negative review of systems were noted in the above HPI section. All other review of systems were otherwise negative.  Vital signs were reviewed in today's medical record  Physical Exam: General: Chronically ill-appearing female his abdomen while sitting in a wheelchair Skin: anicteric Head: Normocephalic and atraumatic Eyes:  sclerae anicteric, EOMI Ears: Normal auditory acuity Mouth: No deformity or lesions Neck: Supple, no masses or thyromegaly Lungs: Clear throughout to auscultation Heart: Regular rate and rhythm; no murmurs, rubs or bruits Abdomen: Protuberant, obese without obvious masses Rectal:deferred Musculoskeletal:  Symmetrical with no gross deformities  Skin: No lesions on visible extremities Pulses:  Normal pulses noted Extremities: No clubbing, cyanosis, edema or deformities noted Neurological: Alert oriented x 4, grossly nonfocal Cervical Nodes:  No significant cervical adenopathy Inguinal Nodes: No significant inguinal adenopathy Psychological:  Alert and cooperative. Normal mood and affect  See Assessment and Plan under Problem List

## 2014-05-11 NOTE — Patient Instructions (Signed)
You have been scheduled for an abdominal ultrasound at [facility] (1st floor of hospital) on 05/18/2014 at 8am. Please arrive 15 minutes prior to your appointment for registration. Make certain not to have anything to eat or drink 6 hours prior to your appointment. Should you need to reschedule your appointment, please contact radiology at [phone]. This test typically takes about 30 minutes to perform.

## 2014-05-11 NOTE — Assessment & Plan Note (Signed)
This is a continuous problem which is probably due to peristomal bleeding.  Bleeding very likely contributes to anemia as well.  She has been worked up extensively in the past.  Recommendations #1 check CBC

## 2014-05-12 ENCOUNTER — Telehealth: Payer: Self-pay | Admitting: *Deleted

## 2014-05-12 NOTE — Telephone Encounter (Signed)
RECVD LETTER FROM [employer] THAT PTS XIFAXIAN HAS BEEN APPROVED FROM 05/09/2014-05/09/2015   LETTER SENT TO BE SCANNED IN  CALLED PATIENT, SHE IS AWARE THAT XIFAXIAN IS APPROVED AND TO CONTACT HER PHARMACY

## 2014-05-13 ENCOUNTER — Other Ambulatory Visit (HOSPITAL_BASED_OUTPATIENT_CLINIC_OR_DEPARTMENT_OTHER): Payer: Medicare Other

## 2014-05-13 DIAGNOSIS — D500 Iron deficiency anemia secondary to blood loss (chronic): Secondary | ICD-10-CM

## 2014-05-13 DIAGNOSIS — D509 Iron deficiency anemia, unspecified: Secondary | ICD-10-CM

## 2014-05-13 LAB — CBC WITH DIFFERENTIAL/PLATELET
BASO%: 1.3 % (ref 0.0–2.0)
BASOS ABS: 0.2 10*3/uL — AB (ref 0.0–0.1)
EOS%: 6.7 % (ref 0.0–7.0)
Eosinophils Absolute: 1 10*3/uL — ABNORMAL HIGH (ref 0.0–0.5)
HCT: 22.7 % — ABNORMAL LOW (ref 34.8–46.6)
HGB: 7.2 g/dL — ABNORMAL LOW (ref 11.6–15.9)
LYMPH%: 17.1 % (ref 14.0–49.7)
MCH: 26.7 pg (ref 25.1–34.0)
MCHC: 31.5 g/dL (ref 31.5–36.0)
MCV: 84.7 fL (ref 79.5–101.0)
MONO#: 2.8 10*3/uL — ABNORMAL HIGH (ref 0.1–0.9)
MONO%: 18.9 % — AB (ref 0.0–14.0)
NEUT%: 56 % (ref 38.4–76.8)
NEUTROS ABS: 8.3 10*3/uL — AB (ref 1.5–6.5)
PLATELETS: 433 10*3/uL — AB (ref 145–400)
RBC: 2.68 10*6/uL — AB (ref 3.70–5.45)
RDW: 16.3 % — AB (ref 11.2–14.5)
WBC: 14.8 10*3/uL — ABNORMAL HIGH (ref 3.9–10.3)
lymph#: 2.5 10*3/uL (ref 0.9–3.3)

## 2014-05-13 LAB — COMPREHENSIVE METABOLIC PANEL (CC13)
ALBUMIN: 3.5 g/dL (ref 3.5–5.0)
ALT: 32 U/L (ref 0–55)
ANION GAP: 15 meq/L — AB (ref 3–11)
AST: 42 U/L — ABNORMAL HIGH (ref 5–34)
Alkaline Phosphatase: 115 U/L (ref 40–150)
BUN: 45 mg/dL — ABNORMAL HIGH (ref 7.0–26.0)
CALCIUM: 8.4 mg/dL (ref 8.4–10.4)
CO2: 19 meq/L — AB (ref 22–29)
Chloride: 96 mEq/L — ABNORMAL LOW (ref 98–109)
Creatinine: 1.4 mg/dL — ABNORMAL HIGH (ref 0.6–1.1)
EGFR: 40 mL/min/{1.73_m2} — AB (ref >90)
GLUCOSE: 133 mg/dL (ref 70–140)
POTASSIUM: 5.2 meq/L — AB (ref 3.5–5.1)
SODIUM: 131 meq/L — AB (ref 136–145)
TOTAL PROTEIN: 7.4 g/dL (ref 6.4–8.3)
Total Bilirubin: 0.29 mg/dL (ref 0.20–1.20)

## 2014-05-13 LAB — IRON AND TIBC CHCC
%SAT: 3 % — AB (ref 21–57)
IRON: 12 ug/dL — AB (ref 41–142)
TIBC: 433 ug/dL (ref 236–444)
UIBC: 421 ug/dL — AB (ref 120–384)

## 2014-05-13 LAB — FERRITIN CHCC: FERRITIN: 24 ng/mL (ref 9–269)

## 2014-05-15 ENCOUNTER — Telehealth: Payer: Self-pay | Admitting: Internal Medicine

## 2014-05-15 ENCOUNTER — Inpatient Hospital Stay (HOSPITAL_COMMUNITY)
Admission: EM | Admit: 2014-05-15 | Discharge: 2014-05-23 | DRG: 291 | Disposition: A | Discharge status: Skilled Nursing Facility | Payer: Medicare Other | Attending: Internal Medicine | Admitting: Internal Medicine

## 2014-05-15 ENCOUNTER — Other Ambulatory Visit: Payer: Self-pay

## 2014-05-15 ENCOUNTER — Encounter (HOSPITAL_COMMUNITY): Payer: Self-pay | Admitting: Emergency Medicine

## 2014-05-15 ENCOUNTER — Emergency Department (HOSPITAL_COMMUNITY): Payer: Medicare Other

## 2014-05-15 DIAGNOSIS — Q211 Atrial septal defect: Secondary | ICD-10-CM

## 2014-05-15 DIAGNOSIS — J9600 Acute respiratory failure, unspecified whether with hypoxia or hypercapnia: Secondary | ICD-10-CM | POA: Diagnosis present

## 2014-05-15 DIAGNOSIS — R109 Unspecified abdominal pain: Secondary | ICD-10-CM

## 2014-05-15 DIAGNOSIS — I952 Hypotension due to drugs: Secondary | ICD-10-CM | POA: Diagnosis present

## 2014-05-15 DIAGNOSIS — E1165 Type 2 diabetes mellitus with hyperglycemia: Secondary | ICD-10-CM | POA: Diagnosis present

## 2014-05-15 DIAGNOSIS — D500 Iron deficiency anemia secondary to blood loss (chronic): Secondary | ICD-10-CM | POA: Insufficient documentation

## 2014-05-15 DIAGNOSIS — R627 Adult failure to thrive: Secondary | ICD-10-CM | POA: Diagnosis present

## 2014-05-15 DIAGNOSIS — E7220 Disorder of urea cycle metabolism, unspecified: Secondary | ICD-10-CM | POA: Diagnosis not present

## 2014-05-15 DIAGNOSIS — M1388 Other specified arthritis, other site: Secondary | ICD-10-CM | POA: Diagnosis present

## 2014-05-15 DIAGNOSIS — Z902 Acquired absence of lung [part of]: Secondary | ICD-10-CM | POA: Diagnosis present

## 2014-05-15 DIAGNOSIS — M13849 Other specified arthritis, unspecified hand: Secondary | ICD-10-CM | POA: Diagnosis present

## 2014-05-15 DIAGNOSIS — K9401 Colostomy hemorrhage: Secondary | ICD-10-CM | POA: Diagnosis present

## 2014-05-15 DIAGNOSIS — J449 Chronic obstructive pulmonary disease, unspecified: Secondary | ICD-10-CM | POA: Diagnosis present

## 2014-05-15 DIAGNOSIS — Z933 Colostomy status: Secondary | ICD-10-CM

## 2014-05-15 DIAGNOSIS — E8770 Fluid overload, unspecified: Secondary | ICD-10-CM

## 2014-05-15 DIAGNOSIS — E1140 Type 2 diabetes mellitus with diabetic neuropathy, unspecified: Secondary | ICD-10-CM | POA: Diagnosis present

## 2014-05-15 DIAGNOSIS — Z950 Presence of cardiac pacemaker: Secondary | ICD-10-CM

## 2014-05-15 DIAGNOSIS — D638 Anemia in other chronic diseases classified elsewhere: Secondary | ICD-10-CM | POA: Diagnosis present

## 2014-05-15 DIAGNOSIS — E785 Hyperlipidemia, unspecified: Secondary | ICD-10-CM | POA: Diagnosis present

## 2014-05-15 DIAGNOSIS — T40605A Adverse effect of unspecified narcotics, initial encounter: Secondary | ICD-10-CM | POA: Diagnosis not present

## 2014-05-15 DIAGNOSIS — E871 Hypo-osmolality and hyponatremia: Secondary | ICD-10-CM | POA: Diagnosis present

## 2014-05-15 DIAGNOSIS — G8929 Other chronic pain: Secondary | ICD-10-CM | POA: Diagnosis present

## 2014-05-15 DIAGNOSIS — G4733 Obstructive sleep apnea (adult) (pediatric): Secondary | ICD-10-CM | POA: Diagnosis present

## 2014-05-15 DIAGNOSIS — J45909 Unspecified asthma, uncomplicated: Secondary | ICD-10-CM | POA: Diagnosis present

## 2014-05-15 DIAGNOSIS — I509 Heart failure, unspecified: Secondary | ICD-10-CM

## 2014-05-15 DIAGNOSIS — W19XXXA Unspecified fall, initial encounter: Secondary | ICD-10-CM | POA: Diagnosis present

## 2014-05-15 DIAGNOSIS — Z881 Allergy status to other antibiotic agents status: Secondary | ICD-10-CM

## 2014-05-15 DIAGNOSIS — Z6837 Body mass index (BMI) 37.0-37.9, adult: Secondary | ICD-10-CM

## 2014-05-15 DIAGNOSIS — K7290 Hepatic failure, unspecified without coma: Secondary | ICD-10-CM | POA: Diagnosis not present

## 2014-05-15 DIAGNOSIS — K3189 Other diseases of stomach and duodenum: Secondary | ICD-10-CM | POA: Diagnosis present

## 2014-05-15 DIAGNOSIS — E876 Hypokalemia: Secondary | ICD-10-CM | POA: Diagnosis not present

## 2014-05-15 DIAGNOSIS — Y833 Surgical operation with formation of external stoma as the cause of abnormal reaction of the patient, or of later complication, without mention of misadventure at the time of the procedure: Secondary | ICD-10-CM | POA: Diagnosis present

## 2014-05-15 DIAGNOSIS — N189 Chronic kidney disease, unspecified: Secondary | ICD-10-CM | POA: Diagnosis present

## 2014-05-15 DIAGNOSIS — K5909 Other constipation: Secondary | ICD-10-CM | POA: Diagnosis not present

## 2014-05-15 DIAGNOSIS — Z87891 Personal history of nicotine dependence: Secondary | ICD-10-CM

## 2014-05-15 DIAGNOSIS — Z85118 Personal history of other malignant neoplasm of bronchus and lung: Secondary | ICD-10-CM

## 2014-05-15 DIAGNOSIS — IMO0002 Reserved for concepts with insufficient information to code with codable children: Secondary | ICD-10-CM | POA: Diagnosis present

## 2014-05-15 DIAGNOSIS — I5033 Acute on chronic diastolic (congestive) heart failure: Secondary | ICD-10-CM | POA: Diagnosis not present

## 2014-05-15 DIAGNOSIS — E662 Morbid (severe) obesity with alveolar hypoventilation: Secondary | ICD-10-CM | POA: Diagnosis present

## 2014-05-15 DIAGNOSIS — R0902 Hypoxemia: Secondary | ICD-10-CM

## 2014-05-15 DIAGNOSIS — N179 Acute kidney failure, unspecified: Secondary | ICD-10-CM | POA: Diagnosis present

## 2014-05-15 DIAGNOSIS — R0602 Shortness of breath: Secondary | ICD-10-CM | POA: Diagnosis present

## 2014-05-15 DIAGNOSIS — Z8541 Personal history of malignant neoplasm of cervix uteri: Secondary | ICD-10-CM

## 2014-05-15 DIAGNOSIS — Z8249 Family history of ischemic heart disease and other diseases of the circulatory system: Secondary | ICD-10-CM

## 2014-05-15 DIAGNOSIS — I130 Hypertensive heart and chronic kidney disease with heart failure and stage 1 through stage 4 chronic kidney disease, or unspecified chronic kidney disease: Secondary | ICD-10-CM | POA: Diagnosis present

## 2014-05-15 DIAGNOSIS — Z9981 Dependence on supplemental oxygen: Secondary | ICD-10-CM

## 2014-05-15 DIAGNOSIS — N183 Chronic kidney disease, stage 3 (moderate): Secondary | ICD-10-CM | POA: Diagnosis present

## 2014-05-15 DIAGNOSIS — Z794 Long term (current) use of insulin: Secondary | ICD-10-CM

## 2014-05-15 DIAGNOSIS — K7460 Unspecified cirrhosis of liver: Secondary | ICD-10-CM | POA: Diagnosis present

## 2014-05-15 DIAGNOSIS — R601 Generalized edema: Secondary | ICD-10-CM | POA: Diagnosis present

## 2014-05-15 DIAGNOSIS — J9621 Acute and chronic respiratory failure with hypoxia: Secondary | ICD-10-CM | POA: Diagnosis present

## 2014-05-15 DIAGNOSIS — J189 Pneumonia, unspecified organism: Secondary | ICD-10-CM | POA: Diagnosis present

## 2014-05-15 DIAGNOSIS — Z833 Family history of diabetes mellitus: Secondary | ICD-10-CM

## 2014-05-15 DIAGNOSIS — K219 Gastro-esophageal reflux disease without esophagitis: Secondary | ICD-10-CM | POA: Diagnosis present

## 2014-05-15 LAB — HEPATIC FUNCTION PANEL
ALK PHOS: 113 U/L (ref 39–117)
ALT: 29 U/L (ref 0–35)
AST: 40 U/L — ABNORMAL HIGH (ref 0–37)
Albumin: 3.7 g/dL (ref 3.5–5.2)
Bilirubin, Direct: <0.1 mg/dL (ref 0.0–0.5)
Total Bilirubin: 0.4 mg/dL (ref 0.3–1.2)
Total Protein: 7.8 g/dL (ref 6.0–8.3)

## 2014-05-15 LAB — CBC
HEMATOCRIT: 22.1 % — AB (ref 36.0–46.0)
HEMOGLOBIN: 7.1 g/dL — AB (ref 12.0–15.0)
MCH: 26.9 pg (ref 26.0–34.0)
MCHC: 32.1 g/dL (ref 30.0–36.0)
MCV: 83.7 fL (ref 78.0–100.0)
Platelets: 513 10*3/uL — ABNORMAL HIGH (ref 150–400)
RBC: 2.64 MIL/uL — ABNORMAL LOW (ref 3.87–5.11)
RDW: 16.1 % — AB (ref 11.5–15.5)
WBC: 16 10*3/uL — AB (ref 4.0–10.5)

## 2014-05-15 LAB — BASIC METABOLIC PANEL
ANION GAP: 11 (ref 5–15)
BUN: 43 mg/dL — AB (ref 6–23)
CALCIUM: 9.9 mg/dL (ref 8.4–10.5)
CHLORIDE: 96 mmol/L (ref 96–112)
CO2: 22 mmol/L (ref 19–32)
CREATININE: 1.51 mg/dL — AB (ref 0.50–1.10)
GFR calc non Af Amer: 36 mL/min — ABNORMAL LOW (ref >90)
GFR, EST AFRICAN AMERICAN: 42 mL/min — AB (ref >90)
GLUCOSE: 82 mg/dL (ref 70–99)
Potassium: 4.8 mmol/L (ref 3.5–5.1)
Sodium: 129 mmol/L — ABNORMAL LOW (ref 135–145)

## 2014-05-15 LAB — I-STAT TROPONIN, ED: Troponin i, poc: 0.04 ng/mL (ref 0.00–0.08)

## 2014-05-15 LAB — I-STAT CG4 LACTIC ACID, ED: LACTIC ACID, VENOUS: 1.39 mmol/L (ref 0.5–2.0)

## 2014-05-15 MED ORDER — ONDANSETRON HCL 4 MG/2ML IJ SOLN
4.0000 mg | Freq: Once | INTRAMUSCULAR | Status: AC
  Administered 2014-05-16: 4 mg via INTRAVENOUS
  Filled 2014-05-15: qty 2

## 2014-05-15 MED ORDER — HYDROMORPHONE HCL 1 MG/ML IJ SOLN
1.0000 mg | Freq: Once | INTRAMUSCULAR | Status: DC
  Filled 2014-05-15 (×2): qty 1

## 2014-05-15 NOTE — ED Notes (Signed)
Pt arrives via EMS from home with c/o [facility] onset today. 2L O2 at home, increased to 4L by EMS today. Generalized weakness. Was seen by PCP on Thursday, who expressed concerns r/t fluid overload in bilateral legs and abdomen. Pacemaker, chronic abdominal pain.

## 2014-05-15 NOTE — ED Provider Notes (Signed)
CSN: [id number]     Arrival date & time 05/15/14  2228 History   This chart was scribed for Orpah Greek, MD by Eustaquio Maize, ED Scribe. This patient was seen in room A06C/A06C and the patient's care was started at 10:59 PM.    Chief Complaint  Patient presents with  . Shortness of Breath  . Edema   The history is provided by the patient. No language interpreter was used.     HPI Comments: [name] is a 64 y.o. female brought in by ambulance, with hx COPD and CHF on 2L O2 at home who presents to the Emergency Department complaining of increased shortness of breath that began last night. Pt states that she would wake up gasping for air last night. Pt had to use 4 pillows to be able to breathe while sleeping. Pt also mentions becoming short of breath with palpitations upon walking to the bathroom. She mentions that she has been coughing as well. Pt was put on 4L O2 by EMS. Her O2 saturation in ED is 100% on 3L. Pt also complains of chronic abdominal pain that began 3 years ago, worsening in the last couple of days. She saw PCP, Dr. [name], on Thursday 4/14 (3 days ago for these symptoms. Dr. [name] believes pt has fluid overload in bilateral legs and abdomen. Pt has appointment for ultrasound this week. She denies chest pain, nausea, vomiting, or any other symptoms. Husband reports pt just got over a colon infection and has a colostomy bag in place.   Past Medical History  Diagnosis Date  . Cirrhosis   . GERD (gastroesophageal reflux disease)   . Cervical disc syndrome     trouble turnng neck at times  . Chronic respiratory failure   . Diverticulitis of colon   . Perforation of colon   . IBS (irritable bowel syndrome)   . Chronic lower GI bleeding   . Overactive bladder   . Thrombocytopenia     sees Dr. Julien Nordmann   . Splenomegaly   . Depression   . Hypertension   . Asthma   . Heart murmur   . Hyperlipidemia   . COPD (chronic obstructive pulmonary disease)     sees Dr.  Gwenette Greet   . Colostomy care   . Hypercalcemia   . CHF (congestive heart failure)   . Pacemaker 12/14/2013  . 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 Cervical cancer many years ago  . Complication of anesthesia     woke up during colonscopy and endoscopy in past  . History of blood transfusion "several"    "bleeding via ostomy" (01/04/2014)  . On home oxygen therapy     "2L; 24/7" (01/04/2014)  . Pneumonia "1-2 times"  . Chronic bronchitis "several times"  . Sleep apnea 2012    mild, no cpap needed  . Type II diabetes mellitus     sees Dr. Cruzita Lederer   . Chronic disease anemia     sees Dr. Julien Nordmann, due to chronic disease and GI losses   . History of stomach ulcers   . Migraine     "last one was several years ago" (01/04/2014)  . Arthritis     "tailbone; hands; legs" (01/04/2014)  . Anxiety   . Pericardial effusion 2007, 2015.   [name] Chronic abdominal pain     IBS  . Cirrhosis of liver     stage 4   Past Surgical History  Procedure Laterality  Date  . Colostomy  11/06/2005  . Lung removal, partial Left 2004    upper lobe removed  . Left colectomy  11/06/2005    Hartmann resection of sigmoid colon and end colostomy.  . Esophagogastroduodenoscopy  03/19/2011    Procedure: ESOPHAGOGASTRODUODENOSCOPY (EGD);  Surgeon: [name], MD;  Location: [facility];  Service: Endoscopy;  Laterality: N/A;  . Colonoscopy  03/19/2011    Procedure: COLONOSCOPY;  Surgeon: [name], MD;  Location: [facility];  Service: Endoscopy;  Laterality: N/A;  . Givens capsule study  03/20/2011    Procedure: GIVENS CAPSULE STUDY;  Surgeon: [name], MD;  Location: [facility];  Service: Endoscopy;  Laterality: N/A;  . Small bowel obstruction repair  March 2012  . Umbilical hernia repair  March 2012  . Splenectomy, total N/A 02/24/2013    Procedure: SPLENECTOMY;  Surgeon: Harl Bowie, MD;  Location: [facility];  Service: General;  Laterality: N/A;  . Orif patella Left  04/21/2013    Procedure: OPEN REDUCTION INTERNAL (ORIF) FIXATION PATELLA;  Surgeon: Augustin Schooling, MD;  Location: [facility];  Service: Orthopedics;  Laterality: Left;  . Pacemaker insertion  2015  . Esophagogastroduodenoscopy (egd) with propofol N/A 11/02/2013    Procedure: ESOPHAGOGASTRODUODENOSCOPY (EGD) WITH PROPOFOL;  Surgeon: [name], MD;  Location: [facility];  Service: Endoscopy;  Laterality: N/A;  EGD with APC  . Hot hemostasis N/A 11/02/2013    Procedure: HOT HEMOSTASIS (ARGON PLASMA COAGULATION/BICAP);  Surgeon: [name], MD;  Location: [facility];  Service: Endoscopy;  Laterality: N/A;  . Colon surgery    . Hernia repair    . Fracture surgery    . Abdominal hysterectomy    . Tubal ligation    . Tonsillectomy  1959  . Appendectomy  1962  . Cholecystectomy    . Cardiac catheterization  1967  . Permanent pacemaker insertion N/A 09/07/2013    Procedure: PERMANENT PACEMAKER INSERTION;  Surgeon: Evans Lance, MD;  Location: [facility] CATH LAB;  Service: Cardiovascular;  Laterality: N/A;  . Tee without cardioversion N/A 01/06/2014    Procedure: TRANSESOPHAGEAL ECHOCARDIOGRAM (TEE);  Surgeon: Lelon Perla, MD;  Location: [facility] ENDOSCOPY;  Service: Cardiovascular;  Laterality: N/A;  . Colostomy  11/06/2005    OPERATIVE REPORT   Family History  Problem Relation Age of Onset  . Coronary artery disease    . Diabetes type II    . Heart attack Mother   . Diabetes type II Mother   . Cirrhosis Father   . Heart attack Sister   . Pancreatic cancer Mother     secondary from surgery  . Cervical cancer Maternal Grandmother   . Hypertension Father   . Hypertension Sister   . Hypertension Son   . Hypertension Daughter   . Stroke Neg Hx    History  Substance Use Topics  . Smoking status: Former Smoker -- 2.00 packs/day for 35 years    Types: Cigarettes    Quit date: 03/24/2002  . Smokeless tobacco: Never Used  . Alcohol Use: No   OB History    Gravida Para Term Preterm AB  TAB SAB Ectopic Multiple Living   6 4        4      Review of Systems  Respiratory: Positive for cough and shortness of breath.   Cardiovascular: Positive for palpitations and leg swelling. Negative for chest pain.  Gastrointestinal: Positive for abdominal distention. Negative for nausea and vomiting.  All other systems reviewed  and are negative.     Allergies  Acetaminophen; Morphine; Morphine and related; Other; Penicillins; Trazodone and nefazodone; Codeine phosphate; Hydrocodone-acetaminophen; Cephalexin; and Hydrocodone-acetaminophen  Home Medications   Prior to Admission medications   Medication Sig Start Date End Date Taking? Authorizing Provider  albuterol (PROVENTIL) (2.5 MG/3ML) 0.083% nebulizer solution Take 3 mLs (2.5 mg total) by nebulization every 4 (four) hours as needed for wheezing or shortness of breath. 03/02/14  Yes [name], MD  albuterol-ipratropium (COMBIVENT) 18-103 MCG/ACT inhaler Inhale 2 puffs into the lungs every 4 (four) hours as needed for wheezing or shortness of breath.    Yes Historical Provider, MD  atorvastatin (LIPITOR) 20 MG tablet Take 1 tablet (20 mg total) by mouth every morning. 03/02/14  Yes [name], MD  diazepam (VALIUM) 5 MG tablet Take 1 tablet (5 mg total) by mouth 3 (three) times daily as needed. Patient taking differently: Take 5 mg by mouth 3 (three) times daily as needed for anxiety.  03/02/14  Yes [name], MD  dicyclomine (BENTYL) 10 MG capsule Take 10 mg by mouth 3 (three) times daily.    Yes Historical Provider, MD  esomeprazole (NEXIUM) 40 MG capsule Take 40 mg by mouth daily.  03/02/14  Yes Historical Provider, MD  Fluticasone-Salmeterol (ADVAIR) 250-50 MCG/DOSE AEPB Inhale 1 puff into the lungs every 12 (twelve) hours. 03/02/14  Yes [name], MD  folic acid (FOLVITE) 1 MG tablet TAKE ONE TABLET BY MOUTH ONCE DAILY IN THE EVENING 05/10/14  Yes [name], MD  furosemide (LASIX) 40 MG tablet Take 2 tablets (80 mg total)  by mouth 2 (two) times daily. 03/30/14  Yes Liliane Shi, PA-C  hydrALAZINE (APRESOLINE) 25 MG tablet Take 1.5 tablets (37.5 mg total) by mouth 2 (two) times daily. 03/02/14  Yes [name], MD  insulin regular human CONCENTRATED (HUMULIN R) 500 UNIT/ML SOLN injection Inject under skin 0.33 mL before b'fast, 0.33 mL before lunch and 0.30 mL before dinner Patient taking differently: Inject 0.3-0.33 Units into the skin 3 (three) times daily with meals. Inject under skin 0.33 mL before b'fast, 0.33 mL before lunch and 0.30 mL before dinner 03/01/14  Yes Philemon Kingdom, MD  INSULIN SYRINGE 1CC/29G (B-D INSULIN SYRINGE) 29G X 1/2" 1 ML MISC Use to inject insulin 3 times daily. 03/01/14  Yes Philemon Kingdom, MD  isosorbide dinitrate (ISORDIL) 20 MG tablet Take 1 tablet (20 mg total) by mouth 2 (two) times daily. 10/13/13  Yes [name], MD  lactulose (CEPHULAC) 10 G packet Take 1 packet (10 g total) by mouth 2 (two) times daily between meals as needed (titrate to at least 2 BMs daily). 04/21/14  Yes Nishant Dhungel, MD  losartan (COZAAR) 50 MG tablet Take 1 tablet (50 mg total) by mouth daily. 03/18/14  Yes Scott Joylene Draft, PA-C  methocarbamol (ROBAXIN) 500 MG tablet Take 1 tablet (500 mg total) by mouth every 6 (six) hours as needed for muscle spasms. 05/03/13  Yes Ivan Anchors Love, PA-C  metoCLOPramide (REGLAN) 10 MG tablet Take 1 tablet (10 mg total) by mouth 3 (three) times daily. 04/12/13  Yes [name], MD  metolazone (ZAROXOLYN) 2.5 MG tablet Take 2.5 mg by mouth daily.  03/02/14  Yes Historical Provider, MD  Oxycodone HCl 20 MG TABS Take 1 tablet (20 mg total) by mouth every 3 (three) hours as needed (pain). 03/02/14  Yes [name], MD  OXYGEN Inhale 2 L into the lungs  continuous.   Yes Historical Provider, MD  polyethylene glycol (MIRALAX / GLYCOLAX) packet Take 17 g by mouth every 12 (twelve) hours as needed for moderate constipation or severe constipation. 04/21/14  Yes Nishant Dhungel, MD  potassium  chloride (KLOR-CON 10) 10 MEQ tablet Take 1 tablet (10 mEq total) by mouth daily. 03/16/14  Yes [name], MD  promethazine (PHENERGAN) 25 MG tablet Take 1 tablet (25 mg total) by mouth every 4 (four) hours as needed for nausea or vomiting. 11/23/13  Yes [name], MD  rifaximin (XIFAXAN) 550 MG TABS tablet Take 1 tablet (550 mg total) by mouth 2 (two) times daily. 04/21/14  Yes Nishant Dhungel, MD  saccharomyces boulardii (FLORASTOR) 250 MG capsule Take 1 capsule (250 mg total) by mouth 2 (two) times daily. 04/21/14  Yes Nishant Dhungel, MD  fluconazole (DIFLUCAN) 150 MG tablet Take 1 tablet (150 mg total) by mouth once. Patient not taking: Reported on 05/15/2014 04/28/14   Eulas Post, MD   Triage Vitals: BP 142/32 mmHg  Pulse 64  Temp(Src) 99.9 F (37.7 C) (Oral)  Resp 22  SpO2 100%   Physical Exam  Constitutional: She is oriented to person, place, and time. She appears well-developed and well-nourished. No distress.  HENT:  Head: Normocephalic and atraumatic.  Right Ear: Hearing normal.  Left Ear: Hearing normal.  Nose: Nose normal.  Mouth/Throat: Oropharynx is clear and moist and mucous membranes are normal.  Eyes: Conjunctivae and EOM are normal. Pupils are equal, round, and reactive to light.  Neck: Normal range of motion. Neck supple.  Cardiovascular: Normal rate, regular rhythm, S1 normal and S2 normal.  Exam reveals gallop. Exam reveals no friction rub.   No murmur heard. S3 gallop.   Pulmonary/Chest: Effort normal. Tachypnea noted. No respiratory distress. She has rales. She exhibits no tenderness.  Breath sounds diminished. Diffuse rales at bases bilaterally.   Abdominal: Soft. Normal appearance and bowel sounds are normal. There is no hepatosplenomegaly. There is no tenderness. There is no rebound, no guarding, no tenderness at McBurney's point and negative Murphy's sign. No hernia.  Musculoskeletal: Normal range of motion. She exhibits edema (3+ pitting edema  bilateral lower extremities. ).  Neurological: She is alert and oriented to person, place, and time. She has normal strength. No cranial nerve deficit or sensory deficit. Coordination normal. GCS eye subscore is 4. GCS verbal subscore is 5. GCS motor subscore is 6.  Skin: Skin is warm, dry and intact. No rash noted. No cyanosis.  Psychiatric: She has a normal mood and affect. Her speech is normal and behavior is normal. Thought content normal.  Nursing note and vitals reviewed.   ED Course  Procedures (including critical care time)  Angiocath insertion Performed by: POLLINA, CHRISTOPHER J.  Consent: Verbal consent obtained. Risks and benefits: risks, benefits and alternatives were discussed Time out: Immediately prior to procedure a "time out" was called to verify the correct patient, procedure, equipment, support staff and site/side marked as required.  Preparation: Patient was prepped and draped in the usual sterile fashion.  Vein Location: right brachial  Ultrasound Guided  Gauge: 20  Normal blood return and flush without difficulty Patient tolerance: Patient tolerated the procedure well with no immediate complications.  Angiocath insertion Performed by: Orpah Greek  Consent: Verbal consent obtained. Risks and benefits: risks, benefits and alternatives were discussed Time out: Immediately prior to procedure a "time out" was called to verify the correct patient, procedure, equipment, support staff and site/side marked  as required.  Preparation: Patient was prepped and draped in the usual sterile fashion.  Vein Location: left brachial  Ultrasound Guided  Gauge: 20  Normal blood return and flush without difficulty Patient tolerance: Patient tolerated the procedure well with no immediate complications.      DIAGNOSTIC STUDIES: Oxygen Saturation is 100% on [place], normal by my interpretation.    COORDINATION OF CARE: 11:02 PM-Discussed treatment plan which  includes CXR and pain medication with pt at bedside and pt agreed to plan.   Labs Review Labs Reviewed  BASIC METABOLIC PANEL - Abnormal; Notable for the following:    Sodium 129 (*)    BUN 43 (*)    Creatinine, Ser 1.51 (*)    GFR calc non Af Amer 36 (*)    GFR calc Af Amer 42 (*)    All other components within normal limits  CBC - Abnormal; Notable for the following:    WBC 16.0 (*)    RBC 2.64 (*)    Hemoglobin 7.1 (*)    HCT 22.1 (*)    RDW 16.1 (*)    Platelets 513 (*)    All other components within normal limits  BRAIN NATRIURETIC PEPTIDE - Abnormal; Notable for the following:    B Natriuretic Peptide 918.6 (*)    All other components within normal limits  HEPATIC FUNCTION PANEL - Abnormal; Notable for the following:    AST 40 (*)    All other components within normal limits  AMMONIA - Abnormal; Notable for the following:    Ammonia 89 (*)    All other components within normal limits  I-STAT ARTERIAL BLOOD GAS, ED - Abnormal; Notable for the following:    pO2, Arterial 112.0 (*)    All other components within normal limits  CULTURE, BLOOD (ROUTINE X 2)  CULTURE, BLOOD (ROUTINE X 2)  I-STAT TROPOININ, ED  I-STAT CG4 LACTIC ACID, ED    Imaging Review Dg Chest Portable 1 View  05/15/2014   CLINICAL DATA:  Shortness of breath  EXAM: PORTABLE CHEST - 1 VIEW  COMPARISON:  04/20/2014, 04/21/2013  FINDINGS: Hypoaeration with interstitial and vascular crowding. Central vascular congestion. Aortic atherosclerosis. Enlarged cardiac silhouette. Mild retrocardiac opacity is similar to prior and increased from 2015. Small left pleural effusion not excluded. Underpenetration and osteopenia limits osseous assessment. No overt interval change. Left chest wall battery pack with unchanged lead tip positions.  IMPRESSION: Central vascular congestion and interstitial prominence may reflect chronic change or atypical/viral infection versus pulmonary edema if acute.  Mild left lower lobe  airspace opacity is nonspecific; atelectasis, scarring, or pneumonia. Recommend attention on follow-up with PA and lateral views in 3-4 weeks.   Electronically Signed   By: Carlos Levering M.D.   On: 05/15/2014 23:32      EKG Interpretation   Date/Time:  Sunday May 15 2014 22:38:03 EDT Ventricular Rate:  64 PR Interval:  149 QRS Duration: 92 QT Interval:  407 QTC Calculation: 420 R Axis:   4 Text Interpretation:  Sinus rhythm Low voltage, precordial leads No  significant change since last tracing Confirmed by POLLINA  MD,  CHRISTOPHER [phone]) on 05/16/2014 2:06:50 AM      MDM   Final diagnoses:  Abdominal pain  CAP (community acquired pneumonia)  Acute on chronic congestive heart failure, unspecified congestive heart failure type  Iron deficiency anemia due to chronic blood loss  Hyperammonemia   Patient with history of COPD and CHF presents to the ER for evaluation of  shortness of breath. Patient reports symptoms are chronic for several days, but much worse over the last 24 hours. She is visibly dyspneic and cannot complete sentences because of her dyspnea. Patient is not hypoxic, however, required increased O2 flow rate. She does report some cough. There is no chest pain associated with symptoms. Chest x-ray shows edema as well as possible pneumonia. Patient treated with Lasix and Levaquin. Of note, she has a penicillin and cephalosporin allergy.  Patient also complaining of abdominal pain. She has a history of cirrhosis. She did see her GI doctor this past week and was told that she had increased fluid in her abdomen. She is scheduled for an outpatient ultrasound that has yet to occur. Patient does have diffuse abdominal tenderness. There is therefore concern for SBP. Pharmacy consultation was obtained. Levaquin was recommended for coverage for community-acquired pneumonia as well as SBP coverage. Patient also has a history of diverticulitis, status post rupture and colostomy. She  therefore underwent CT scan of abdomen. No acute colitis, diverticulitis, obstruction. There is also no significant ascites noted.  Patient was anemic today. This was also noted by Dr. [name], her gastroenterologist, at visit earlier this week. She has had ongoing issues of chronic blood loss secondary to peristomal bleeding.  I personally performed the services described in this documentation, which was scribed in my presence. The recorded information has been reviewed and is accurate.       Orpah Greek, MD 05/16/14 [phone]

## 2014-05-15 NOTE — Telephone Encounter (Signed)
Pt calls tonight stating she is having increased difficulty breathing. She is on 2L O2 via nasal cannula at baseline, but breathing worse.  Very difficult to ambulate due to dyspnea. I advised she call 911 to be evaluated in the ED. She voiced understanding

## 2014-05-16 ENCOUNTER — Encounter (HOSPITAL_COMMUNITY): Payer: Self-pay | Admitting: General Practice

## 2014-05-16 ENCOUNTER — Emergency Department (HOSPITAL_COMMUNITY): Payer: Medicare Other

## 2014-05-16 DIAGNOSIS — E8771 Transfusion associated circulatory overload: Secondary | ICD-10-CM | POA: Diagnosis not present

## 2014-05-16 DIAGNOSIS — R0602 Shortness of breath: Secondary | ICD-10-CM | POA: Diagnosis not present

## 2014-05-16 DIAGNOSIS — E785 Hyperlipidemia, unspecified: Secondary | ICD-10-CM | POA: Diagnosis present

## 2014-05-16 DIAGNOSIS — I952 Hypotension due to drugs: Secondary | ICD-10-CM | POA: Diagnosis present

## 2014-05-16 DIAGNOSIS — Z950 Presence of cardiac pacemaker: Secondary | ICD-10-CM | POA: Diagnosis not present

## 2014-05-16 DIAGNOSIS — K7460 Unspecified cirrhosis of liver: Secondary | ICD-10-CM | POA: Diagnosis present

## 2014-05-16 DIAGNOSIS — I5031 Acute diastolic (congestive) heart failure: Secondary | ICD-10-CM | POA: Diagnosis not present

## 2014-05-16 DIAGNOSIS — I5033 Acute on chronic diastolic (congestive) heart failure: Secondary | ICD-10-CM

## 2014-05-16 DIAGNOSIS — M1388 Other specified arthritis, other site: Secondary | ICD-10-CM | POA: Diagnosis present

## 2014-05-16 DIAGNOSIS — E662 Morbid (severe) obesity with alveolar hypoventilation: Secondary | ICD-10-CM | POA: Diagnosis present

## 2014-05-16 DIAGNOSIS — Z902 Acquired absence of lung [part of]: Secondary | ICD-10-CM | POA: Diagnosis present

## 2014-05-16 DIAGNOSIS — Z85118 Personal history of other malignant neoplasm of bronchus and lung: Secondary | ICD-10-CM | POA: Diagnosis not present

## 2014-05-16 DIAGNOSIS — K7290 Hepatic failure, unspecified without coma: Secondary | ICD-10-CM | POA: Diagnosis not present

## 2014-05-16 DIAGNOSIS — N179 Acute kidney failure, unspecified: Secondary | ICD-10-CM | POA: Diagnosis present

## 2014-05-16 DIAGNOSIS — W19XXXA Unspecified fall, initial encounter: Secondary | ICD-10-CM | POA: Diagnosis present

## 2014-05-16 DIAGNOSIS — J9620 Acute and chronic respiratory failure, unspecified whether with hypoxia or hypercapnia: Secondary | ICD-10-CM | POA: Diagnosis not present

## 2014-05-16 DIAGNOSIS — K5909 Other constipation: Secondary | ICD-10-CM | POA: Diagnosis not present

## 2014-05-16 DIAGNOSIS — E1140 Type 2 diabetes mellitus with diabetic neuropathy, unspecified: Secondary | ICD-10-CM | POA: Diagnosis present

## 2014-05-16 DIAGNOSIS — D500 Iron deficiency anemia secondary to blood loss (chronic): Secondary | ICD-10-CM | POA: Insufficient documentation

## 2014-05-16 DIAGNOSIS — J189 Pneumonia, unspecified organism: Secondary | ICD-10-CM | POA: Diagnosis present

## 2014-05-16 DIAGNOSIS — I130 Hypertensive heart and chronic kidney disease with heart failure and stage 1 through stage 4 chronic kidney disease, or unspecified chronic kidney disease: Secondary | ICD-10-CM | POA: Diagnosis present

## 2014-05-16 DIAGNOSIS — J449 Chronic obstructive pulmonary disease, unspecified: Secondary | ICD-10-CM | POA: Diagnosis present

## 2014-05-16 DIAGNOSIS — Z9981 Dependence on supplemental oxygen: Secondary | ICD-10-CM | POA: Diagnosis not present

## 2014-05-16 DIAGNOSIS — K7469 Other cirrhosis of liver: Secondary | ICD-10-CM | POA: Diagnosis not present

## 2014-05-16 DIAGNOSIS — E871 Hypo-osmolality and hyponatremia: Secondary | ICD-10-CM | POA: Diagnosis present

## 2014-05-16 DIAGNOSIS — E8770 Fluid overload, unspecified: Secondary | ICD-10-CM

## 2014-05-16 DIAGNOSIS — R627 Adult failure to thrive: Secondary | ICD-10-CM | POA: Diagnosis present

## 2014-05-16 DIAGNOSIS — E876 Hypokalemia: Secondary | ICD-10-CM | POA: Diagnosis not present

## 2014-05-16 DIAGNOSIS — M13849 Other specified arthritis, unspecified hand: Secondary | ICD-10-CM | POA: Diagnosis present

## 2014-05-16 DIAGNOSIS — R601 Generalized edema: Secondary | ICD-10-CM | POA: Diagnosis present

## 2014-05-16 DIAGNOSIS — J45909 Unspecified asthma, uncomplicated: Secondary | ICD-10-CM | POA: Diagnosis present

## 2014-05-16 DIAGNOSIS — Q211 Atrial septal defect: Secondary | ICD-10-CM | POA: Diagnosis not present

## 2014-05-16 DIAGNOSIS — Z881 Allergy status to other antibiotic agents status: Secondary | ICD-10-CM | POA: Diagnosis not present

## 2014-05-16 DIAGNOSIS — G4733 Obstructive sleep apnea (adult) (pediatric): Secondary | ICD-10-CM | POA: Diagnosis present

## 2014-05-16 DIAGNOSIS — E1165 Type 2 diabetes mellitus with hyperglycemia: Secondary | ICD-10-CM | POA: Diagnosis present

## 2014-05-16 DIAGNOSIS — G8929 Other chronic pain: Secondary | ICD-10-CM | POA: Diagnosis present

## 2014-05-16 DIAGNOSIS — E7220 Disorder of urea cycle metabolism, unspecified: Secondary | ICD-10-CM | POA: Diagnosis present

## 2014-05-16 DIAGNOSIS — Z6837 Body mass index (BMI) 37.0-37.9, adult: Secondary | ICD-10-CM | POA: Diagnosis not present

## 2014-05-16 DIAGNOSIS — N183 Chronic kidney disease, stage 3 (moderate): Secondary | ICD-10-CM | POA: Diagnosis not present

## 2014-05-16 DIAGNOSIS — Z833 Family history of diabetes mellitus: Secondary | ICD-10-CM | POA: Diagnosis not present

## 2014-05-16 DIAGNOSIS — D638 Anemia in other chronic diseases classified elsewhere: Secondary | ICD-10-CM | POA: Diagnosis present

## 2014-05-16 DIAGNOSIS — N189 Chronic kidney disease, unspecified: Secondary | ICD-10-CM | POA: Diagnosis present

## 2014-05-16 DIAGNOSIS — I509 Heart failure, unspecified: Secondary | ICD-10-CM | POA: Diagnosis not present

## 2014-05-16 DIAGNOSIS — Z794 Long term (current) use of insulin: Secondary | ICD-10-CM | POA: Diagnosis not present

## 2014-05-16 DIAGNOSIS — Z87891 Personal history of nicotine dependence: Secondary | ICD-10-CM | POA: Diagnosis not present

## 2014-05-16 DIAGNOSIS — K9401 Colostomy hemorrhage: Secondary | ICD-10-CM | POA: Diagnosis present

## 2014-05-16 DIAGNOSIS — J9600 Acute respiratory failure, unspecified whether with hypoxia or hypercapnia: Secondary | ICD-10-CM | POA: Diagnosis present

## 2014-05-16 DIAGNOSIS — T40605A Adverse effect of unspecified narcotics, initial encounter: Secondary | ICD-10-CM | POA: Diagnosis not present

## 2014-05-16 DIAGNOSIS — Y833 Surgical operation with formation of external stoma as the cause of abnormal reaction of the patient, or of later complication, without mention of misadventure at the time of the procedure: Secondary | ICD-10-CM | POA: Diagnosis present

## 2014-05-16 DIAGNOSIS — J9601 Acute respiratory failure with hypoxia: Secondary | ICD-10-CM | POA: Diagnosis not present

## 2014-05-16 DIAGNOSIS — K3189 Other diseases of stomach and duodenum: Secondary | ICD-10-CM | POA: Diagnosis present

## 2014-05-16 DIAGNOSIS — Z933 Colostomy status: Secondary | ICD-10-CM | POA: Diagnosis not present

## 2014-05-16 DIAGNOSIS — K219 Gastro-esophageal reflux disease without esophagitis: Secondary | ICD-10-CM | POA: Diagnosis present

## 2014-05-16 DIAGNOSIS — J9621 Acute and chronic respiratory failure with hypoxia: Secondary | ICD-10-CM | POA: Diagnosis present

## 2014-05-16 DIAGNOSIS — Z8541 Personal history of malignant neoplasm of cervix uteri: Secondary | ICD-10-CM | POA: Diagnosis not present

## 2014-05-16 DIAGNOSIS — Z8249 Family history of ischemic heart disease and other diseases of the circulatory system: Secondary | ICD-10-CM | POA: Diagnosis not present

## 2014-05-16 LAB — URINALYSIS, ROUTINE W REFLEX MICROSCOPIC
Bilirubin Urine: NEGATIVE
GLUCOSE, UA: NEGATIVE mg/dL
Hgb urine dipstick: NEGATIVE
KETONES UR: NEGATIVE mg/dL
Leukocytes, UA: NEGATIVE
Nitrite: NEGATIVE
Protein, ur: NEGATIVE mg/dL
SPECIFIC GRAVITY, URINE: 1.007 (ref 1.005–1.030)
Urobilinogen, UA: 0.2 mg/dL (ref 0.0–1.0)
pH: 5 (ref 5.0–8.0)

## 2014-05-16 LAB — I-STAT ARTERIAL BLOOD GAS, ED
Acid-base deficit: 1 mmol/L (ref 0.0–2.0)
Bicarbonate: 23.7 mEq/L (ref 20.0–24.0)
O2 Saturation: 98 %
PO2 ART: 112 mmHg — AB (ref 80.0–100.0)
TCO2: 25 mmol/L (ref 0–100)
pCO2 arterial: 42 mmHg (ref 35.0–45.0)
pH, Arterial: 7.363 (ref 7.350–7.450)

## 2014-05-16 LAB — GLUCOSE, CAPILLARY
GLUCOSE-CAPILLARY: 96 mg/dL (ref 70–99)
GLUCOSE-CAPILLARY: 254 mg/dL — AB (ref 70–99)
Glucose-Capillary: 41 mg/dL — CL (ref 70–99)
Glucose-Capillary: 104 mg/dL — ABNORMAL HIGH (ref 70–99)

## 2014-05-16 LAB — AMMONIA: Ammonia: 89 umol/L — ABNORMAL HIGH (ref 11–32)

## 2014-05-16 LAB — MRSA PCR SCREENING: MRSA BY PCR: NEGATIVE

## 2014-05-16 LAB — PREPARE RBC (CROSSMATCH)

## 2014-05-16 LAB — BRAIN NATRIURETIC PEPTIDE: B Natriuretic Peptide: 918.6 pg/mL — ABNORMAL HIGH (ref 0.0–100.0)

## 2014-05-16 MED ORDER — LEVOFLOXACIN IN D5W 750 MG/150ML IV SOLN
750.0000 mg | Freq: Once | INTRAVENOUS | Status: AC
  Administered 2014-05-16: 750 mg via INTRAVENOUS
  Filled 2014-05-16: qty 150

## 2014-05-16 MED ORDER — HYDROMORPHONE HCL 1 MG/ML IJ SOLN
1.0000 mg | Freq: Once | INTRAMUSCULAR | Status: AC
  Administered 2014-05-16: 1 mg via INTRAMUSCULAR

## 2014-05-16 MED ORDER — ALBUTEROL SULFATE (2.5 MG/3ML) 0.083% IN NEBU: 2.5000 mg | INHALATION_SOLUTION | Freq: Four times a day (QID) | RESPIRATORY_TRACT | Status: DC

## 2014-05-16 MED ORDER — INSULIN ASPART 100 UNIT/ML ~~LOC~~ SOLN
0.0000 [IU] | SUBCUTANEOUS | Status: DC
  Administered 2014-05-16: 5 [IU] via SUBCUTANEOUS
  Administered 2014-05-16: 9 [IU] via SUBCUTANEOUS
  Administered 2014-05-17: 7 [IU] via SUBCUTANEOUS

## 2014-05-16 MED ORDER — HYDROMORPHONE HCL 1 MG/ML IJ SOLN
1.0000 mg | Freq: Once | INTRAMUSCULAR | Status: AC
  Administered 2014-05-16: 1 mg via INTRAVENOUS

## 2014-05-16 MED ORDER — HYDROMORPHONE HCL 1 MG/ML IJ SOLN
1.0000 mg | Freq: Once | INTRAMUSCULAR | Status: AC
  Administered 2014-05-16: 1 mg via INTRAVENOUS
  Filled 2014-05-16: qty 1

## 2014-05-16 MED ORDER — LEVOFLOXACIN IN D5W 750 MG/150ML IV SOLN
750.0000 mg | INTRAVENOUS | Status: DC
  Administered 2014-05-18: 750 mg via INTRAVENOUS
  Filled 2014-05-16: qty 150

## 2014-05-16 MED ORDER — OXYCODONE HCL 5 MG PO TABS
20.0000 mg | ORAL_TABLET | ORAL | Status: DC | PRN
  Administered 2014-05-16 – 2014-05-23 (×35): 20 mg via ORAL
  Filled 2014-05-16 (×49): qty 4

## 2014-05-16 MED ORDER — SPIRONOLACTONE 50 MG PO TABS
50.0000 mg | ORAL_TABLET | Freq: Every day | ORAL | Status: DC
  Filled 2014-05-16: qty 1

## 2014-05-16 MED ORDER — SODIUM CHLORIDE 0.9 % IJ SOLN
3.0000 mL | Freq: Two times a day (BID) | INTRAMUSCULAR | Status: DC
Start: 2014-05-16 — End: 2014-05-23
  Administered 2014-05-16 – 2014-05-23 (×15): 3 mL via INTRAVENOUS

## 2014-05-16 MED ORDER — HYDROMORPHONE HCL 1 MG/ML IJ SOLN
INTRAMUSCULAR | Status: AC
  Filled 2014-05-16: qty 1

## 2014-05-16 MED ORDER — METOLAZONE 2.5 MG PO TABS
2.5000 mg | ORAL_TABLET | Freq: Two times a day (BID) | ORAL | Status: AC
  Administered 2014-05-16 (×2): 2.5 mg via ORAL
  Filled 2014-05-16 (×2): qty 1

## 2014-05-16 MED ORDER — FUROSEMIDE 10 MG/ML IJ SOLN: 40.0000 mg | Freq: Two times a day (BID) | INTRAMUSCULAR | Status: DC

## 2014-05-16 MED ORDER — METHYLPREDNISOLONE SODIUM SUCC 125 MG IJ SOLR
125.0000 mg | Freq: Once | INTRAMUSCULAR | Status: AC
  Administered 2014-05-16: 125 mg via INTRAVENOUS
  Filled 2014-05-16: qty 2

## 2014-05-16 MED ORDER — SODIUM CHLORIDE 0.9 % IV SOLN
250.0000 mL | INTRAVENOUS | Status: DC | PRN
  Administered 2014-05-17: 250 mL via INTRAVENOUS

## 2014-05-16 MED ORDER — ONDANSETRON HCL 4 MG/2ML IJ SOLN
4.0000 mg | Freq: Four times a day (QID) | INTRAMUSCULAR | Status: DC | PRN
  Administered 2014-05-21: 4 mg via INTRAVENOUS
  Filled 2014-05-16: qty 2

## 2014-05-16 MED ORDER — FUROSEMIDE 10 MG/ML IJ SOLN
80.0000 mg | Freq: Once | INTRAMUSCULAR | Status: AC
  Administered 2014-05-16: 80 mg via INTRAVENOUS
  Filled 2014-05-16: qty 8

## 2014-05-16 MED ORDER — LACTULOSE 10 GM/15ML PO SOLN
10.0000 g | Freq: Two times a day (BID) | ORAL | Status: DC | PRN
  Filled 2014-05-16: qty 15

## 2014-05-16 MED ORDER — IOHEXOL 300 MG/ML  SOLN: 25.0000 mL | INTRAMUSCULAR | Status: AC

## 2014-05-16 MED ORDER — IPRATROPIUM-ALBUTEROL 0.5-2.5 (3) MG/3ML IN SOLN
3.0000 mL | Freq: Once | RESPIRATORY_TRACT | Status: AC
  Administered 2014-05-16: 3 mL via RESPIRATORY_TRACT
  Filled 2014-05-16: qty 3

## 2014-05-16 MED ORDER — HYDROMORPHONE HCL 1 MG/ML IJ SOLN
0.5000 mg | INTRAMUSCULAR | Status: DC | PRN
  Administered 2014-05-16 – 2014-05-19 (×2): 0.5 mg via INTRAVENOUS
  Filled 2014-05-16 (×2): qty 1

## 2014-05-16 MED ORDER — LACTULOSE 10 G PO PACK: 10.0000 g | PACK | Freq: Two times a day (BID) | ORAL | Status: DC | PRN

## 2014-05-16 MED ORDER — FUROSEMIDE 10 MG/ML IJ SOLN
80.0000 mg | Freq: Two times a day (BID) | INTRAMUSCULAR | Status: AC
  Administered 2014-05-16 – 2014-05-17 (×4): 80 mg via INTRAVENOUS
  Filled 2014-05-16 (×6): qty 8

## 2014-05-16 MED ORDER — IPRATROPIUM-ALBUTEROL 0.5-2.5 (3) MG/3ML IN SOLN
3.0000 mL | RESPIRATORY_TRACT | Status: DC
  Administered 2014-05-16 – 2014-05-18 (×11): 3 mL via RESPIRATORY_TRACT
  Filled 2014-05-16 (×11): qty 3

## 2014-05-16 MED ORDER — SODIUM CHLORIDE 0.9 % IV SOLN
Freq: Once | INTRAVENOUS | Status: AC
  Administered 2014-05-16: 09:00:00 via INTRAVENOUS

## 2014-05-16 MED ORDER — DEXTROSE 50 % IV SOLN
25.0000 mL | INTRAVENOUS | Status: DC | PRN
  Administered 2014-05-16: 50 mL via INTRAVENOUS
  Filled 2014-05-16: qty 50

## 2014-05-16 MED ORDER — SODIUM CHLORIDE 0.9 % IJ SOLN: 3.0000 mL | INTRAMUSCULAR | Status: DC | PRN

## 2014-05-16 NOTE — Progress Notes (Signed)
Utilization review completed. Felipe Paluch, RN, BSN. 

## 2014-05-16 NOTE — Consult Note (Signed)
CARDIOLOGY CONSULT NOTE  Patient ID: RASHEEDAH REIS, MRN: [id number], DOB/AGE: 07-17-1950 64 y.o. Admit date: 05/15/2014 Date of Consult: 05/16/2014  Primary Physician: [name], MD Primary Cardiologist: Dr Lovena Le Referring Physician: Dr Erlinda Hong  Chief Complaint: Shortness of Breath Reason for Consultation: CHF  HPI: 64 year old woman, home oxygen dependent, normally on 2 L per nasal cannula 24 hours per day, who presented with worsening shortness of breath. The patient has multiple comorbid medical conditions including chronic abdominal pain, type 2 diabetes, and cirrhosis. The patient is being treated for community-acquired pneumonia. She has acute kidney. Acute diastolic heart failure is thought to be contributing to her symptoms and we are asked to see her in consultation.  The patient progressive abdominal and leg swelling over the last 2 weeks. She has shortness of breath with orthopnea. She denies cough, fever, or chills. She avoids salt completely. She has been taking her Lasix 120 mg twice daily without fail. She denies chest pain or pressure.  A transesophageal echocardiogram in December 2015 showed normal LV systolic function without significant valvular disease. This study was performed because of bacteremia and there was no evidence of endocarditis on her valves or her pacemaker lead.  Medical History:  Past Medical History  Diagnosis Date  . Cirrhosis   . GERD (gastroesophageal reflux disease)   . Cervical disc syndrome     trouble turnng neck at times  . Chronic respiratory failure   . Diverticulitis of colon   . Perforation of colon   . IBS (irritable bowel syndrome)   . Chronic lower GI bleeding   . Overactive bladder   . Thrombocytopenia     sees Dr. Julien Nordmann   . Splenomegaly   . Depression   . Hypertension   . Asthma   . Heart murmur   . Hyperlipidemia   . COPD (chronic obstructive pulmonary disease)     sees Dr. Gwenette Greet   . Colostomy care   .  Hypercalcemia   . CHF (congestive heart failure)   . Pacemaker 12/14/2013  . 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 Cervical cancer many years ago  . Complication of anesthesia     woke up during colonscopy and endoscopy in past  . History of blood transfusion "several"    "bleeding via ostomy" (01/04/2014)  . On home oxygen therapy     "2L; 24/7" (01/04/2014)  . Pneumonia "1-2 times"  . Chronic bronchitis "several times"  . Sleep apnea 2012    mild, no cpap needed  . Type II diabetes mellitus     sees Dr. Cruzita Lederer   . Chronic disease anemia     sees Dr. Julien Nordmann, due to chronic disease and GI losses   . History of stomach ulcers   . Migraine     "last one was several years ago" (01/04/2014)  . Arthritis     "tailbone; hands; legs" (01/04/2014)  . Anxiety   . Pericardial effusion 2007, 2015.   [name] Chronic abdominal pain     IBS  . Cirrhosis of liver     stage 4      Surgical History:  Past Surgical History  Procedure Laterality Date  . Colostomy  11/06/2005  . Lung removal, partial Left 2004    upper lobe removed  . Left colectomy  11/06/2005    Hartmann resection of sigmoid colon and end colostomy.  . Esophagogastroduodenoscopy  03/19/2011    Procedure: ESOPHAGOGASTRODUODENOSCOPY (EGD);  Surgeon:  [name], MD;  Location: [facility];  Service: Endoscopy;  Laterality: N/A;  . Colonoscopy  03/19/2011    Procedure: COLONOSCOPY;  Surgeon: [name], MD;  Location: [facility];  Service: Endoscopy;  Laterality: N/A;  . Givens capsule study  03/20/2011    Procedure: GIVENS CAPSULE STUDY;  Surgeon: [name], MD;  Location: [facility];  Service: Endoscopy;  Laterality: N/A;  . Small bowel obstruction repair  March 2012  . Umbilical hernia repair  March 2012  . Splenectomy, total N/A 02/24/2013    Procedure: SPLENECTOMY;  Surgeon: Harl Bowie, MD;  Location: [place];  Service: General;  Laterality: N/A;  . Orif patella Left 04/21/2013      Procedure: OPEN REDUCTION INTERNAL (ORIF) FIXATION PATELLA;  Surgeon: Augustin Schooling, MD;  Location: [place];  Service: Orthopedics;  Laterality: Left;  . Pacemaker insertion  2015  . Esophagogastroduodenoscopy (egd) with propofol N/A 11/02/2013    Procedure: ESOPHAGOGASTRODUODENOSCOPY (EGD) WITH PROPOFOL;  Surgeon: [name], MD;  Location: [facility];  Service: Endoscopy;  Laterality: N/A;  EGD with APC  . Hot hemostasis N/A 11/02/2013    Procedure: HOT HEMOSTASIS (ARGON PLASMA COAGULATION/BICAP);  Surgeon: [name], MD;  Location: [facility];  Service: Endoscopy;  Laterality: N/A;  . Colon surgery    . Hernia repair    . Fracture surgery    . Abdominal hysterectomy    . Tubal ligation    . Tonsillectomy  1959  . Appendectomy  1962  . Cholecystectomy    . Cardiac catheterization  1967  . Permanent pacemaker insertion N/A 09/07/2013    Procedure: PERMANENT PACEMAKER INSERTION;  Surgeon: Evans Lance, MD;  Location: [facility];  Service: Cardiovascular;  Laterality: N/A;  . Tee without cardioversion N/A 01/06/2014    Procedure: TRANSESOPHAGEAL ECHOCARDIOGRAM (TEE);  Surgeon: Lelon Perla, MD;  Location: [facility] ENDOSCOPY;  Service: Cardiovascular;  Laterality: N/A;  . Colostomy  11/06/2005    OPERATIVE REPORT     Home Meds: Prior to Admission medications   Medication Sig Start Date End Date Taking? Authorizing Provider  albuterol (PROVENTIL) (2.5 MG/3ML) 0.083% nebulizer solution Take 3 mLs (2.5 mg total) by nebulization every 4 (four) hours as needed for wheezing or shortness of breath. 03/02/14  Yes [name], MD  albuterol-ipratropium (COMBIVENT) 18-103 MCG/ACT inhaler Inhale 2 puffs into the lungs every 4 (four) hours as needed for wheezing or shortness of breath.    Yes Historical Provider, MD  atorvastatin (LIPITOR) 20 MG tablet Take 1 tablet (20 mg total) by mouth every morning. 03/02/14  Yes [name], MD  diazepam (VALIUM) 5 MG tablet Take 1 tablet (5 mg  total) by mouth 3 (three) times daily as needed. Patient taking differently: Take 5 mg by mouth 3 (three) times daily as needed for anxiety.  03/02/14  Yes [name], MD  dicyclomine (BENTYL) 10 MG capsule Take 10 mg by mouth 3 (three) times daily.    Yes Historical Provider, MD  esomeprazole (NEXIUM) 40 MG capsule Take 40 mg by mouth daily.  03/02/14  Yes Historical Provider, MD  Fluticasone-Salmeterol (ADVAIR) 250-50 MCG/DOSE AEPB Inhale 1 puff into the lungs every 12 (twelve) hours. 03/02/14  Yes [name], MD  folic acid (FOLVITE) 1 MG tablet TAKE ONE TABLET BY MOUTH ONCE DAILY IN THE EVENING 05/10/14  Yes [name], MD  furosemide (LASIX) 40 MG tablet Take 2 tablets (80 mg total) by mouth  2 (two) times daily. 03/30/14  Yes Liliane Shi, PA-C  hydrALAZINE (APRESOLINE) 25 MG tablet Take 1.5 tablets (37.5 mg total) by mouth 2 (two) times daily. 03/02/14  Yes [name], MD  insulin regular human CONCENTRATED (HUMULIN R) 500 UNIT/ML SOLN injection Inject under skin 0.33 mL before b'fast, 0.33 mL before lunch and 0.30 mL before dinner Patient taking differently: Inject 0.3-0.33 Units into the skin 3 (three) times daily with meals. Inject under skin 0.33 mL before b'fast, 0.33 mL before lunch and 0.30 mL before dinner 03/01/14  Yes Philemon Kingdom, MD  INSULIN SYRINGE 1CC/29G (B-D INSULIN SYRINGE) 29G X 1/2" 1 ML MISC Use to inject insulin 3 times daily. 03/01/14  Yes Philemon Kingdom, MD  isosorbide dinitrate (ISORDIL) 20 MG tablet Take 1 tablet (20 mg total) by mouth 2 (two) times daily. 10/13/13  Yes [name], MD  lactulose (CEPHULAC) 10 G packet Take 1 packet (10 g total) by mouth 2 (two) times daily between meals as needed (titrate to at least 2 BMs daily). 04/21/14  Yes Nishant Dhungel, MD  losartan (COZAAR) 50 MG tablet Take 1 tablet (50 mg total) by mouth daily. 03/18/14  Yes Scott Joylene Draft, PA-C  methocarbamol (ROBAXIN) 500 MG tablet Take 1 tablet (500 mg total) by mouth every 6 (six)  hours as needed for muscle spasms. 05/03/13  Yes Ivan Anchors Love, PA-C  metoCLOPramide (REGLAN) 10 MG tablet Take 1 tablet (10 mg total) by mouth 3 (three) times daily. 04/12/13  Yes [name], MD  metolazone (ZAROXOLYN) 2.5 MG tablet Take 2.5 mg by mouth daily.  03/02/14  Yes Historical Provider, MD  Oxycodone HCl 20 MG TABS Take 1 tablet (20 mg total) by mouth every 3 (three) hours as needed (pain). 03/02/14  Yes [name], MD  OXYGEN Inhale 2 L into the lungs continuous.   Yes Historical Provider, MD  polyethylene glycol (MIRALAX / GLYCOLAX) packet Take 17 g by mouth every 12 (twelve) hours as needed for moderate constipation or severe constipation. 04/21/14  Yes Nishant Dhungel, MD  potassium chloride (KLOR-CON 10) 10 MEQ tablet Take 1 tablet (10 mEq total) by mouth daily. 03/16/14  Yes [name], MD  promethazine (PHENERGAN) 25 MG tablet Take 1 tablet (25 mg total) by mouth every 4 (four) hours as needed for nausea or vomiting. 11/23/13  Yes [name], MD  rifaximin (XIFAXAN) 550 MG TABS tablet Take 1 tablet (550 mg total) by mouth 2 (two) times daily. 04/21/14  Yes Nishant Dhungel, MD  saccharomyces boulardii (FLORASTOR) 250 MG capsule Take 1 capsule (250 mg total) by mouth 2 (two) times daily. 04/21/14  Yes Nishant Dhungel, MD  fluconazole (DIFLUCAN) 150 MG tablet Take 1 tablet (150 mg total) by mouth once. Patient not taking: Reported on 05/15/2014 04/28/14   Eulas Post, MD    Inpatient Medications:  . furosemide  80 mg Intravenous Q12H  . insulin aspart  0-9 Units Subcutaneous 6 times per day  . ipratropium-albuterol  3 mL Nebulization Q4H  . [START ON 05/18/2014] levofloxacin (LEVAQUIN) IV  750 mg Intravenous Q48H  . metolazone  2.5 mg Oral BID  . sodium chloride  3 mL Intravenous Q12H      Allergies:  Allergies  Allergen Reactions  . Acetaminophen Other (See Comments)    Cirrhosis of liver  . Morphine Other (See Comments)    REACTION: Lowers BP  . Morphine And  Related Other (See Comments)  Blood pressure drops   . Other Other (See Comments)    AGENT:  Per pt, CANNOT TAKE ANY FORM OF BLOOD THINNER, due to cirrhosis of the liver  . Penicillins Anaphylaxis and Rash  . Trazodone And Nefazodone Other (See Comments)    Cardiac arrythmia - DO NOT USE  . Codeine Phosphate Other (See Comments)    REACTION: Stomach cramps  . Hydrocodone-Acetaminophen Other (See Comments)    REACTION: hallucinations  . Cephalexin Swelling and Rash  . Hydrocodone-Acetaminophen Other (See Comments)    unknown    History   Social History  . Marital Status: Divorced    Spouse Name: N/A  . Number of Children: N/A  . Years of Education: N/A   Occupational History  . Disabled    Social History Main Topics  . Smoking status: Former Smoker -- 2.00 packs/day for 35 years    Types: Cigarettes    Quit date: 03/24/2002  . Smokeless tobacco: Never Used  . Alcohol Use: No  . Drug Use: No  . Sexual Activity: No   Other Topics Concern  . Not on file   Social History Narrative   Regular exercise: a little   Caffeine use: 2 cups of coffee in am           Family History  Problem Relation Age of Onset  . Coronary artery disease    . Diabetes type II    . Heart attack Mother   . Diabetes type II Mother   . Cirrhosis Father   . Heart attack Sister   . Pancreatic cancer Mother     secondary from surgery  . Cervical cancer Maternal Grandmother   . Hypertension Father   . Hypertension Sister   . Hypertension Son   . Hypertension Daughter   . Stroke Neg Hx      Review of Systems: General: negative for chills, fever, night sweats or weight changes.  ENT: negative for rhinorrhea or epistaxis Cardiovascular: see HPI Dermatological: negative for rash Respiratory: negative for cough or wheezing GI: positive for chronic abdominal pain GU: no hematuria, urgency, or frequency Neurologic: negative for visual changes, syncope, headache, or dizziness Heme: no  easy bruising or bleeding Endo: negative for excessive thirst, thyroid disorder, or flushing Musculoskeletal: negative for joint pain or swelling, negative for myalgias  All other systems reviewed and are otherwise negative except as noted above.  Physical Exam: Blood pressure 132/56, pulse 68, temperature 98.2 F (36.8 C), temperature source Oral, resp. rate 11, weight 235 lb 14.3 oz (107 kg), SpO2 100 %. Pt is alert and oriented, WD, pleasant obese woman in no distress on 4L O2 per [place] HEENT: normal Neck: JVP normal. Carotid upstrokes normal without bruits. No thyromegaly. Lungs: equal expansion, clear bilaterally CV: Apex is discrete and nondisplaced, RRR without murmur or gallop Abd: soft, diffuse tenderness, distended Back: no CVA tenderness Ext: trace edema        DP/PT pulses intact and = Skin: warm and dry without rash Neuro: CNII-XII intact             Strength intact = bilaterally    Labs: No results for input(s): CKTOTAL, CKMB, TROPONINI in the last 72 hours. Lab Results  Component Value Date   WBC 16.0* 05/15/2014   HGB 7.1* 05/15/2014   HCT 22.1* 05/15/2014   MCV 83.7 05/15/2014   PLT 513* 05/15/2014    Recent Labs Lab 05/15/14 2255 05/15/14 2322  NA 129*  --   K 4.8  --  CL 96  --   CO2 22  --   BUN 43*  --   CREATININE 1.51*  --   CALCIUM 9.9  --   PROT  --  7.8  BILITOT  --  0.4  ALKPHOS  --  113  ALT  --  29  AST  --  40*  GLUCOSE 82  --    Lab Results  Component Value Date   CHOL 173 10/05/2013   HDL 45.10 10/05/2013   LDLCALC 111* 10/05/2013   TRIG 86.0 10/05/2013   Lab Results  Component Value Date   DDIMER 2.21* 02/24/2013    Radiology/Studies:  Ct Abdomen Pelvis Wo Contrast  05/16/2014   CLINICAL DATA:  Generalized weakness, shortness of breath  EXAM: CT ABDOMEN AND PELVIS WITHOUT CONTRAST  TECHNIQUE: Multidetector CT imaging of the abdomen and pelvis was performed following the standard protocol without IV contrast.  COMPARISON:   04/20/2014 radiograph 04/19/2014 CT  FINDINGS: Small right pleural effusion. Mild bibasilar airspace opacities, predominantly linear and lobular subpleural on the left partially imaged cardiac leads within the right atrium and ventricle.  Organ evaluation is limited in the absence of intravenous contrast. Within this limitation, cirrhotic liver morphology. Cholecystectomy. No intrahepatic biliary ductal dilatation mild extrahepatic biliary ductal prominence is nonspecific.  Absent spleen. Nodular soft tissue density left upper quadrant presumably reflects residual splenules or regenerative splenic tissue. No appreciable abnormality of the pancreas. Bilateral adrenal gland nodularity without a dominant/measurable nodule. Symmetric renal size. Mild bilateral perinephric fat stranding. No hydroureteronephrosis.  Hartmann pouch. Left lower quadrant colostomy. The small bowel loops are of normal caliber. Anterior abdominal wall adhesions. Parastomal hernia containing loops of small bowel without evidence for obstruction or incarceration.  Hysterectomy. 8.8 x 4.3 cm lobulated mass or complex fluid within the operative bed is similar to slightly increased from the prior. Thin walled bladder.  Advanced atherosclerotic disease of the abdominal aorta and branch vessels.  Diffuse anasarca.  Mild multilevel degenerative changes.  IMPRESSION: Left lower quadrant colostomy. Small bowel containing parastomal hernia. No bowel obstruction.  8.8 x 4.3 cm fluid collection or mass within the hysterectomy bed, similar to slightly increased since the prior. Per prior report, this is previously thought to be of ovarian origin.  Cirrhosis. Mild extrahepatic biliary ductal dilatation is nonspecific post cholecystectomy. Correlate with LFTs and ERCP if warranted.  Small right pleural effusion. Mild bibasilar airspace opacities; atelectasis and/or pneumonia. Small right pleural effusion.   Electronically Signed   By: Carlos Levering M.D.    On: 05/16/2014 05:39   Ct Abdomen Pelvis W Contrast  04/19/2014   CLINICAL DATA:  Colitis eval for perforated colon Cirrhosis, hernia repair, splecectomy, hyst GB appy, LUL removed. Lung ca dx 2004. Cervical ca. Known pelvic mass.  EXAM: CT ABDOMEN AND PELVIS WITH CONTRAST  TECHNIQUE: Multidetector CT imaging of the abdomen and pelvis was performed using the standard protocol following bolus administration of intravenous contrast.  CONTRAST:  116mL OMNIPAQUE IOHEXOL 300 MG/ML  SOLN  COMPARISON:  04/15/2014, 04/12/2014 and 03/04/2014  FINDINGS: Lung bases are within normal. Pacer leads are present over the heart which is mildly enlarged and unchanged.  Abdominal images demonstrate evidence of a previous cholecystectomy and splenectomy. Mild nodular contour of the liver compatible with history of cirrhosis. Stable 1 cm cystic focus over the pancreatic head and stable 9 mm cystic focus over the pancreatic body. Adrenal glands are unremarkable. Appendix is not seen. There is calcified plaque over the abdominal aorta and iliac  arteries.  Kidneys are normal in size without significant hydronephrosis or nephrolithiasis. The ureters are unremarkable.  There are 2 small stable hernias over the epigastric region with the larger just left of midline containing a small amount of fluid. There are postsurgical changes of the abdominal wall with diastases of the rectus abdominus muscles unchanged. Colostomy over the left lower quadrant is unchanged.  There are several contrast filled dilated small bowel loops which can be followed to the anterior midline of the mid abdomen as it is difficult to follow the small bowel beyond this point as contrast is not present distal to this point. The colostomy defect in the left lower quadrant does contained a few loops of small bowel. There may be developing small bowel obstruction due to adhesions over the anterior mid abdomen versus related to the colostomy site. No free peritoneal air.  There has been interval resolution of the previously noted moderate wall thickening involving the cecum and ascending colon.  There is subcutaneous edema over the abdominal wall over the lower abdomen/pelvis. No change in patient's known pelvic mass thought to be ovarian origin. Evidence of a Hartmann's pouch. Bladder is unremarkable. Remainder the exam is unchanged.  IMPRESSION: Resolution of previously noted significant wall thickening of the ascending colon/cecum. New dilated small bowel loops measuring up to 5 cm in diameter which can be followed to the anterior midline of the mid abdomen as it is difficult to follow the small bowel beyond this point and no significant contrast seen distal to this point. This likely represents developing small-bowel obstruction which may be due to adhesions over the anterior midline of the mid abdomen although may be related to patient's colostomy which contains several small bowel loops herniating through the colostomy defect.  Stable known pelvic mass thought to be ovarian in origin as described on previous CT and ultrasound.  Two small stable pancreatic cystic foci. Recommend followup MRI one year. This recommendation follows ACR consensus guidelines: Managing Incidental Findings on Abdominal CT: White Paper of the ACR Incidental Findings Committee. J Am Coll Radiol 2010;7:754-773  Multiple other chronic stable findings as described.  These results will be called to the ordering clinician or representative by the Radiologist Assistant, and communication documented in the PACS or zVision Dashboard.   Electronically Signed   By: Marin Olp M.D.   On: 04/19/2014 17:03   Dg Chest Portable 1 View  05/15/2014   CLINICAL DATA:  Shortness of breath  EXAM: PORTABLE CHEST - 1 VIEW  COMPARISON:  04/20/2014, 04/21/2013  FINDINGS: Hypoaeration with interstitial and vascular crowding. Central vascular congestion. Aortic atherosclerosis. Enlarged cardiac silhouette. Mild retrocardiac  opacity is similar to prior and increased from 2015. Small left pleural effusion not excluded. Underpenetration and osteopenia limits osseous assessment. No overt interval change. Left chest wall battery pack with unchanged lead tip positions.  IMPRESSION: Central vascular congestion and interstitial prominence may reflect chronic change or atypical/viral infection versus pulmonary edema if acute.  Mild left lower lobe airspace opacity is nonspecific; atelectasis, scarring, or pneumonia. Recommend attention on follow-up with PA and lateral views in 3-4 weeks.   Electronically Signed   By: Carlos Levering M.D.   On: 05/15/2014 23:32   Dg Abd Acute W/chest  04/20/2014   CLINICAL DATA:  Abdominal pain extending from the upper abdomen to the left lower quadrant. Prior diagnosis of small bowel obstruction  EXAM: ACUTE ABDOMEN SERIES (ABDOMEN 2 VIEW & CHEST 1 VIEW)  COMPARISON:  CT 04/19/2014  FINDINGS: Left  pacer is in place with leads in the right atrium and right ventricle, unchanged. Chronic interstitial prominence throughout the lungs without confluent airspace opacity or effusion. Heart is normal size.  Gas within mildly prominent bowel loops centrally in the abdomen, some which appear to represent colon. There likely are mildly dilated small bowel loops as well, similar to recent CT. No free air. No organomegaly. Prior cholecystectomy. No acute bony abnormality or suspicious calcification.  IMPRESSION: Mildly prominent mid abdominal bowel loops, likely both large and small bowel. Overall small bowel dilatation likely similar to recent CT.   Electronically Signed   By: Rolm Baptise M.D.   On: 04/20/2014 15:51   EKG: sinus rhythm 64 bpm, low voltage, nonspecific ST/T-wave abnormalities  TEE 01/06/2014: Study Conclusions  - Left ventricle: Systolic function was normal. The estimated ejection fraction was in the range of 55% to 60%. Wall motion was normal; there were no regional wall motion  abnormalities. - Aortic valve: No evidence of vegetation. - Mitral valve: No evidence of vegetation. - Left atrium: The atrium was mildly dilated. No evidence of thrombus in the atrial cavity or appendage. - Atrial septum: There was a patent foramen ovale. - Tricuspid valve: No evidence of vegetation. - Pulmonic valve: No evidence of vegetation.  Impressions:  - Normal LV function; mild LAE; trace MR and TR; patent foramen ovale noted. There was no evidence of a vegetation on valves or pacemaker leads.  Cardiac Studies: TEE: Study Conclusions  - Left ventricle: Systolic function was normal. The estimated ejection fraction was in the range of 55% to 60%. Wall motion was normal; there were no regional wall motion abnormalities. - Aortic valve: No evidence of vegetation. - Mitral valve: No evidence of vegetation. - Left atrium: The atrium was mildly dilated. No evidence of thrombus in the atrial cavity or appendage. - Atrial septum: There was a patent foramen ovale. - Tricuspid valve: No evidence of vegetation. - Pulmonic valve: No evidence of vegetation.  Impressions:  - Normal LV function; mild LAE; trace MR and TR; patent foramen ovale noted. There was no evidence of a vegetation on valves or pacemaker leads.  ASSESSMENT AND PLAN:  1. Acute on chronic diastolic heart failure 2. Acute on chronic respiratory failure, now clinically improving 3. Acute kidney injury on background of CK D stage III 4. Uncontrolled diabetes with complications 5. 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 Chronic abdominal pain 7. Chronic gastrointestinal blood loss  This patient is very complex. I suspect her recent decompensation is multifactorial, clearly with diastolic heart failure playing a significant role. I would continue IV diuresis with furosemide. However, she will need close follow-up of her renal function with another metabolic panel tomorrow  morning. Her respiratory failure is improving as she has gone from BiPAP now down to 4 L of oxygen by nasal cannula.the patient just had a transesophageal echocardiogram 4 months ago showing normal LV systolic function and no significant valvular disease. I do not think an echocardiogram needs to be repeated. The patient is receiving metolazone and will need to be very careful with this as it can certainly exacerbate her renal dysfunction and cause hypokalemia. I would recommend switching her to spironolactone tomorrow to be given along with daily furosemide. The cardiology service will follow with you. thx  Signed, Sherren Mocha MD, [facility] 05/16/2014, 5:55 PM

## 2014-05-16 NOTE — Progress Notes (Signed)
ANTIBIOTIC CONSULT NOTE - INITIAL  Pharmacy Consult for Levaquin Indication: pneumonia and CAP  Allergies  Allergen Reactions  . Acetaminophen Other (See Comments)    Cirrhosis of liver  . Morphine Other (See Comments)    REACTION: Lowers BP  . Morphine And Related Other (See Comments)    Blood pressure drops   . Other Other (See Comments)    AGENT:  Per pt, CANNOT TAKE ANY FORM OF BLOOD THINNER, due to cirrhosis of the liver  . Penicillins Anaphylaxis and Rash  . Trazodone And Nefazodone Other (See Comments)    Cardiac arrythmia - DO NOT USE  . Codeine Phosphate Other (See Comments)    REACTION: Stomach cramps  . Hydrocodone-Acetaminophen Other (See Comments)    REACTION: hallucinations  . Cephalexin Swelling and Rash  . Hydrocodone-Acetaminophen Other (See Comments)    unknown    Patient Measurements: Weight: 235 lb 14.3 oz (107 kg)  Vital Signs: Temp: 97.7 F (36.5 C) (04/18 1246) Temp Source: Axillary (04/18 1246) BP: 125/39 mmHg (04/18 1231) Pulse Rate: 63 (04/18 1246) Intake/Output from previous day: 04/17 0701 - 04/18 0700 In: -  Out: 600 [Urine:600] Intake/Output from this shift: Total I/O In: -  Out: 200 [Urine:200]  Labs:  Recent Labs  05/15/14 2255  WBC 16.0*  HGB 7.1*  PLT 513*  CREATININE 1.51*   Estimated Creatinine Clearance: 42.7 mL/min (by C-G formula based on Cr of 1.51). No results for input(s): VANCOTROUGH, VANCOPEAK, VANCORANDOM, GENTTROUGH, GENTPEAK, GENTRANDOM, TOBRATROUGH, TOBRAPEAK, TOBRARND, AMIKACINPEAK, AMIKACINTROU, AMIKACIN in the last 72 hours.   Microbiology: Recent Results (from the past 720 hour(s))  Clostridium Difficile by PCR     Status: None   Collection Time: 04/17/14  8:19 AM  Result Value Ref Range Status   C difficile by pcr NEGATIVE NEGATIVE Final  MRSA PCR Screening     Status: None   Collection Time: 05/16/14  8:28 AM  Result Value Ref Range Status   MRSA by PCR NEGATIVE NEGATIVE Final    Comment:         The GeneXpert MRSA Assay (FDA approved for NASAL specimens only), is one component of a comprehensive MRSA colonization surveillance program. It is not intended to diagnose MRSA infection nor to guide or monitor treatment for MRSA infections.     Medical History: Past Medical History  Diagnosis Date  . Cirrhosis   . GERD (gastroesophageal reflux disease)   . Cervical disc syndrome     trouble turnng neck at times  . Chronic respiratory failure   . Diverticulitis of colon   . Perforation of colon   . IBS (irritable bowel syndrome)   . Chronic lower GI bleeding   . Overactive bladder   . Thrombocytopenia     sees Dr. Julien Nordmann   . Splenomegaly   . Depression   . Hypertension   . Asthma   . Heart murmur   . Hyperlipidemia   . COPD (chronic obstructive pulmonary disease)     sees Dr. Gwenette Greet   . Colostomy care   . Hypercalcemia   . CHF (congestive heart failure)   . Pacemaker 12/14/2013  . 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 Cervical cancer many years ago  . Complication of anesthesia     woke up during colonscopy and endoscopy in past  . History of blood transfusion "several"    "bleeding via ostomy" (01/04/2014)  . On home oxygen therapy     "2L;  24/7" (01/04/2014)  . Pneumonia "1-2 times"  . Chronic bronchitis "several times"  . Sleep apnea 2012    mild, no cpap needed  . Type II diabetes mellitus     sees Dr. Cruzita Lederer   . Chronic disease anemia     sees Dr. Julien Nordmann, due to chronic disease and GI losses   . History of stomach ulcers   . Migraine     "last one was several years ago" (01/04/2014)  . Arthritis     "tailbone; hands; legs" (01/04/2014)  . Anxiety   . Pericardial effusion 2007, 2015.   [name] Chronic abdominal pain     IBS  . Cirrhosis of liver     stage 4    Medications:  Prescriptions prior to admission  Medication Sig Dispense Refill Last Dose  . albuterol (PROVENTIL) (2.5 MG/3ML) 0.083% nebulizer solution Take 3 mLs  (2.5 mg total) by nebulization every 4 (four) hours as needed for wheezing or shortness of breath. 75 mL 12 05/15/2014 at Unknown time  . albuterol-ipratropium (COMBIVENT) 18-103 MCG/ACT inhaler Inhale 2 puffs into the lungs every 4 (four) hours as needed for wheezing or shortness of breath.    05/15/2014 at Unknown time  . atorvastatin (LIPITOR) 20 MG tablet Take 1 tablet (20 mg total) by mouth every morning. 90 tablet 3 05/15/2014 at Unknown time  . diazepam (VALIUM) 5 MG tablet Take 1 tablet (5 mg total) by mouth 3 (three) times daily as needed. (Patient taking differently: Take 5 mg by mouth 3 (three) times daily as needed for anxiety. ) 90 tablet 5 05/15/2014 at Unknown time  . dicyclomine (BENTYL) 10 MG capsule Take 10 mg by mouth 3 (three) times daily.    05/15/2014 at Unknown time  . esomeprazole (NEXIUM) 40 MG capsule Take 40 mg by mouth daily.    05/15/2014 at Unknown time  . Fluticasone-Salmeterol (ADVAIR) 250-50 MCG/DOSE AEPB Inhale 1 puff into the lungs every 12 (twelve) hours. 180 each 3 05/15/2014 at Unknown time  . folic acid (FOLVITE) 1 MG tablet TAKE ONE TABLET BY MOUTH ONCE DAILY IN THE EVENING 90 tablet 0 05/15/2014 at Unknown time  . furosemide (LASIX) 40 MG tablet Take 2 tablets (80 mg total) by mouth 2 (two) times daily.   05/15/2014 at Unknown time  . hydrALAZINE (APRESOLINE) 25 MG tablet Take 1.5 tablets (37.5 mg total) by mouth 2 (two) times daily. 270 tablet 3 05/15/2014 at Unknown time  . insulin regular human CONCENTRATED (HUMULIN R) 500 UNIT/ML SOLN injection Inject under skin 0.33 mL before b'fast, 0.33 mL before lunch and 0.30 mL before dinner (Patient taking differently: Inject 0.3-0.33 Units into the skin 3 (three) times daily with meals. Inject under skin 0.33 mL before b'fast, 0.33 mL before lunch and 0.30 mL before dinner) 40 mL 2 05/15/2014 at Unknown time  . INSULIN SYRINGE 1CC/29G (B-D INSULIN SYRINGE) 29G X 1/2" 1 ML MISC Use to inject insulin 3 times daily. 100 each 3  05/15/2014 at Unknown time  . isosorbide dinitrate (ISORDIL) 20 MG tablet Take 1 tablet (20 mg total) by mouth 2 (two) times daily. 180 tablet 3 05/15/2014 at Unknown time  . lactulose (CEPHULAC) 10 G packet Take 1 packet (10 g total) by mouth 2 (two) times daily between meals as needed (titrate to at least 2 BMs daily). 60 each 0 05/15/2014 at Unknown time  . losartan (COZAAR) 50 MG tablet Take 1 tablet (50 mg total) by mouth daily. 30 tablet 11 05/15/2014 at  Unknown time  . methocarbamol (ROBAXIN) 500 MG tablet Take 1 tablet (500 mg total) by mouth every 6 (six) hours as needed for muscle spasms. 60 tablet 0 Past Month at Unknown time  . metoCLOPramide (REGLAN) 10 MG tablet Take 1 tablet (10 mg total) by mouth 3 (three) times daily. 270 tablet 3 05/15/2014 at Unknown time  . metolazone (ZAROXOLYN) 2.5 MG tablet Take 2.5 mg by mouth daily.    05/15/2014 at Unknown time  . Oxycodone HCl 20 MG TABS Take 1 tablet (20 mg total) by mouth every 3 (three) hours as needed (pain). 240 tablet 0 05/15/2014 at Unknown time  . OXYGEN Inhale 2 L into the lungs continuous.   05/15/2014 at Unknown time  . polyethylene glycol (MIRALAX / GLYCOLAX) packet Take 17 g by mouth every 12 (twelve) hours as needed for moderate constipation or severe constipation. 14 each 0 05/13/2014  . potassium chloride (KLOR-CON 10) 10 MEQ tablet Take 1 tablet (10 mEq total) by mouth daily. 30 tablet 11 05/15/2014 at Unknown time  . promethazine (PHENERGAN) 25 MG tablet Take 1 tablet (25 mg total) by mouth every 4 (four) hours as needed for nausea or vomiting. 120 tablet 2 05/14/2014 at Unknown time  . rifaximin (XIFAXAN) 550 MG TABS tablet Take 1 tablet (550 mg total) by mouth 2 (two) times daily. 60 tablet 0 05/15/2014 at Unknown time  . saccharomyces boulardii (FLORASTOR) 250 MG capsule Take 1 capsule (250 mg total) by mouth 2 (two) times daily. 60 capsule 0 05/15/2014 at Unknown time  . fluconazole (DIFLUCAN) 150 MG tablet Take 1 tablet (150 mg  total) by mouth once. (Patient not taking: Reported on 05/15/2014) 1 tablet 0 Taking   Assessment: 64 y.o. female presents with SOB. Pt on Day #1 of Levaquin for CAP. Afeb. WBC elevated to 16. SCr 1.51, est CrCL 42 ml/min. Bld cx pending.  Goal of Therapy:  Resolution of infection  Plan:  Levaquin 750mg  IV q48h Will f/u renal function, pt's clinical condition, and micro data  Sherlon Handing, PharmD, BCPS Clinical pharmacist, pager [phone] 05/16/2014,1:33 PM

## 2014-05-16 NOTE — Consult Note (Signed)
Referring Provider: [facility] Primary Care Physician:  [name], MD Primary Gastroenterologist:  Dr. [name]  Reason for Consultation:     Cirrhosis  HPI: [name] is a 64 y.o. female  with a past medical history significant for nonalcoholic cirrhosis (cirrhosis diagnosed in 2012 and attributed to acetaminophen liver injury), IBS, cardiac arrhythmia status post pacemaker, diastolic CHF, status post colostomy for perforation during colonoscopy, lung cancerstage IB non-small cell lung cancer diagnosed in April 2004 status post left upper lobectomy by Dr. Roxan Hockey) , and COPD on home oxygen. She also has a history of chronic anemia and bleeding per ostomy. 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  She had previously been admitted to [facility] in mid March with encephalopathy and confusion along with abdominal pain. She had a CT that showed colitis and had C. difficile negative diarrhea. She had been scheduled for a colonoscopy but the case was canceled secondary to inadequate Prep after seen by GI she was seen by surgery who ordered a repeat CT scan with contrast. A partial small bowel obstruction was seen in right sided colitis was no longer present. Her encephalopathy resolved on Xifaxan and she was discharged home. She was seen in follow-up on March 31 by medicine and it was felt her chronic bilateral leg edema was likely multifactorial and related to any tablet 80, venous stasis, and diastolic heart failure. She was advised to continue furosemide and use compression stockings. Her encephalopathy  was stable on Xifaxan and lactulose. She had no recurrent symptoms of her colitis. She was seen by Dr. [name] on April 13 and her main complaint is difficulty breathing and weakness she had gained 15 pounds since discharge. She stated she was continuing to have intermittent bleeding via ostomy. An abdominal ultrasound was ordered to assess for ascites however it is not scheduled for the 20th.  Patient presented to the ER on April 17 with complaints of worsening shortness of breath 2 days. She complained of dyspnea on exertion, orthopnea, and increasing edema. She had wheezing and nonproductive cough. Chest x-ray in the emergency room revealed bilateral opacities as well as CHF. She was given 80 mg of IV Lasix in the ER and placed on Levaquin. She was placed on BiPAP which has provided some improvement in her breathing.Pt remains anemic. Patient states she fell last week but landed on her buttocks. She says she didn't hurt anything but the following day passed a large dark clot through her ostomy. Has a unit of packed red blood cells ordered this morning for symptomatic anemia.  Workups for chronic, intermittent bleeding per ostomy and anemia include:  11/02/2013 EGD: Normal.  02/2013 CT abdomen: peristomal hernia.  03/2012 Enteroscopy at [facility]: portal gastropathy, which was possible source of bleeding.  03/2011 Capsule endo: Few tiny red spots throughout SB but no bleeding  03/2011 Colonoscopy via ostomy: mild inflammation of ostomy stump.  03/2011 EGD: Portal gastropathy and gastric vascular ectasia.    Past Medical History  Diagnosis Date  . Cirrhosis   . GERD (gastroesophageal reflux disease)   . Cervical disc syndrome     trouble turnng neck at times  . Chronic respiratory failure   . Diverticulitis of  colon   . Perforation of colon   . IBS (irritable bowel syndrome)   . Chronic lower GI bleeding   . Overactive bladder   . Thrombocytopenia     sees Dr. Julien Nordmann   . Splenomegaly   .  Depression   . Hypertension   . Asthma   . Heart murmur   . Hyperlipidemia   . COPD (chronic obstructive pulmonary disease)     sees Dr. Gwenette Greet   . Colostomy care   . Hypercalcemia   . CHF (congestive heart failure)   . Pacemaker 12/14/2013  . 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 Cervical cancer many years ago  . Complication of anesthesia     woke up during colonscopy and endoscopy in past  . History of blood transfusion "several"    "bleeding via ostomy" (01/04/2014)  . On home oxygen therapy     "2L; 24/7" (01/04/2014)  . Pneumonia "1-2 times"  . Chronic bronchitis "several times"  . Sleep apnea 2012    mild, no cpap needed  . Type II diabetes mellitus     sees Dr. Cruzita Lederer   . Chronic disease anemia     sees Dr. Julien Nordmann, due to chronic disease and GI losses   . History of stomach ulcers   . Migraine     "last one was several years ago" (01/04/2014)  . Arthritis     "tailbone; hands; legs" (01/04/2014)  . Anxiety   . Pericardial effusion 2007, 2015.   [name] Chronic abdominal pain     IBS  . Cirrhosis of liver     stage 4    Past Surgical History  Procedure Laterality Date  . Colostomy  11/06/2005  . Lung removal, partial Left 2004    upper lobe removed  . Left colectomy  11/06/2005    Hartmann resection of sigmoid colon and end colostomy.  . Esophagogastroduodenoscopy  03/19/2011    Procedure: ESOPHAGOGASTRODUODENOSCOPY (EGD);  Surgeon: [name], MD;  Location: [facility];  Service: Endoscopy;  Laterality: N/A;  . Colonoscopy  03/19/2011    Procedure: COLONOSCOPY;  Surgeon: [name], MD;  Location: [facility] ENDOSCOPY;  Service: Endoscopy;  Laterality: N/A;  . Givens capsule study  03/20/2011    Procedure: GIVENS CAPSULE STUDY;  Surgeon: [name], MD;  Location: [facility] ENDOSCOPY;  Service: Endoscopy;  Laterality: N/A;  . Small bowel obstruction repair  March 2012  . Umbilical hernia repair  March 2012  . Splenectomy, total N/A  02/24/2013    Procedure: SPLENECTOMY;  Surgeon: Harl Bowie, MD;  Location: [facility];  Service: General;  Laterality: N/A;  . Orif patella Left 04/21/2013    Procedure: OPEN REDUCTION INTERNAL (ORIF) FIXATION PATELLA;  Surgeon: Augustin Schooling, MD;  Location: [facility];  Service: Orthopedics;  Laterality: Left;  . Pacemaker insertion  2015  . Esophagogastroduodenoscopy (egd) with propofol N/A 11/02/2013    Procedure: ESOPHAGOGASTRODUODENOSCOPY (EGD) WITH PROPOFOL;  Surgeon: [name], MD;  Location: [facility];  Service: Endoscopy;  Laterality: N/A;  EGD with APC  . Hot hemostasis N/A 11/02/2013    Procedure: HOT HEMOSTASIS (ARGON PLASMA COAGULATION/BICAP);  Surgeon: [name], MD;  Location: [facility];  Service: Endoscopy;  Laterality: N/A;  . Colon surgery    . Hernia repair    . Fracture surgery    . Abdominal hysterectomy    . Tubal ligation    . Tonsillectomy  1959  .  Appendectomy  1962  . Cholecystectomy    . Cardiac catheterization  1967  . Permanent pacemaker insertion N/A 09/07/2013    Procedure: PERMANENT PACEMAKER INSERTION;  Surgeon: Evans Lance, MD;  Location: [facility] CATH LAB;  Service: Cardiovascular;  Laterality: N/A;  . Tee without cardioversion N/A 01/06/2014    Procedure: TRANSESOPHAGEAL ECHOCARDIOGRAM (TEE);  Surgeon: Lelon Perla, MD;  Location: [facility] ENDOSCOPY;  Service: Cardiovascular;  Laterality: N/A;  . Colostomy  11/06/2005    OPERATIVE REPORT    Prior to Admission medications   Medication Sig Start Date End Date Taking? Authorizing Provider  albuterol (PROVENTIL) (2.5 MG/3ML) 0.083% nebulizer solution Take 3 mLs (2.5 mg total) by nebulization every 4 (four) hours as needed for wheezing or shortness of breath. 03/02/14  Yes [name], MD  albuterol-ipratropium (COMBIVENT) 18-103 MCG/ACT inhaler Inhale 2 puffs into the lungs every 4 (four) hours as needed for wheezing or shortness of breath.    Yes Historical Provider, MD  atorvastatin (LIPITOR) 20  MG tablet Take 1 tablet (20 mg total) by mouth every morning. 03/02/14  Yes [name], MD  diazepam (VALIUM) 5 MG tablet Take 1 tablet (5 mg total) by mouth 3 (three) times daily as needed. Patient taking differently: Take 5 mg by mouth 3 (three) times daily as needed for anxiety.  03/02/14  Yes [name], MD  dicyclomine (BENTYL) 10 MG capsule Take 10 mg by mouth 3 (three) times daily.    Yes Historical Provider, MD  esomeprazole (NEXIUM) 40 MG capsule Take 40 mg by mouth daily.  03/02/14  Yes Historical Provider, MD  Fluticasone-Salmeterol (ADVAIR) 250-50 MCG/DOSE AEPB Inhale 1 puff into the lungs every 12 (twelve) hours. 03/02/14  Yes [name], MD  folic acid (FOLVITE) 1 MG tablet TAKE ONE TABLET BY MOUTH ONCE DAILY IN THE EVENING 05/10/14  Yes [name], MD  furosemide (LASIX) 40 MG tablet Take 2 tablets (80 mg total) by mouth 2 (two) times daily. 03/30/14  Yes Liliane Shi, PA-C  hydrALAZINE (APRESOLINE) 25 MG tablet Take 1.5 tablets (37.5 mg total) by mouth 2 (two) times daily. 03/02/14  Yes [name], MD  insulin regular human CONCENTRATED (HUMULIN R) 500 UNIT/ML SOLN injection Inject under skin 0.33 mL before b'fast, 0.33 mL before lunch and 0.30 mL before dinner Patient taking differently: Inject 0.3-0.33 Units into the skin 3 (three) times daily with meals. Inject under skin 0.33 mL before b'fast, 0.33 mL before lunch and 0.30 mL before dinner 03/01/14  Yes Philemon Kingdom, MD  INSULIN SYRINGE 1CC/29G (B-D INSULIN SYRINGE) 29G X 1/2" 1 ML MISC Use to inject insulin 3 times daily. 03/01/14  Yes Philemon Kingdom, MD  isosorbide dinitrate (ISORDIL) 20 MG tablet Take 1 tablet (20 mg total) by mouth 2 (two) times daily. 10/13/13  Yes [name], MD  lactulose (CEPHULAC) 10 G packet Take 1 packet (10 g total) by mouth 2 (two) times daily between meals as needed (titrate to at least 2 BMs daily). 04/21/14  Yes Nishant Dhungel, MD  losartan (COZAAR) 50 MG tablet Take 1 tablet (50 mg total) by  mouth daily. 03/18/14  Yes Scott Joylene Draft, PA-C  methocarbamol (ROBAXIN) 500 MG tablet Take 1 tablet (500 mg total) by mouth every 6 (six) hours as needed for muscle spasms. 05/03/13  Yes Ivan Anchors Love, PA-C  metoCLOPramide (REGLAN) 10 MG tablet Take 1 tablet (10 mg total) by mouth 3 (three) times daily. 04/12/13  Yes  [name], MD  metolazone (ZAROXOLYN) 2.5 MG tablet Take 2.5 mg by mouth daily.  03/02/14  Yes Historical Provider, MD  Oxycodone HCl 20 MG TABS Take 1 tablet (20 mg total) by mouth every 3 (three) hours as needed (pain). 03/02/14  Yes [name], MD  OXYGEN Inhale 2 L into the lungs continuous.   Yes Historical Provider, MD  polyethylene glycol (MIRALAX / GLYCOLAX) packet Take 17 g by mouth every 12 (twelve) hours as needed for moderate constipation or severe constipation. 04/21/14  Yes Nishant Dhungel, MD  potassium chloride (KLOR-CON 10) 10 MEQ tablet Take 1 tablet (10 mEq total) by mouth daily. 03/16/14  Yes [name], MD  promethazine (PHENERGAN) 25 MG tablet Take 1 tablet (25 mg total) by mouth every 4 (four) hours as needed for nausea or vomiting. 11/23/13  Yes [name], MD  rifaximin (XIFAXAN) 550 MG TABS tablet Take 1 tablet (550 mg total) by mouth 2 (two) times daily. 04/21/14  Yes Nishant Dhungel, MD  saccharomyces boulardii (FLORASTOR) 250 MG capsule Take 1 capsule (250 mg total) by mouth 2 (two) times daily. 04/21/14  Yes Nishant Dhungel, MD  fluconazole (DIFLUCAN) 150 MG tablet Take 1 tablet (150 mg total) by mouth once. Patient not taking: Reported on 05/15/2014 04/28/14   Eulas Post, MD    Current Facility-Administered Medications  Medication Dose Route Frequency Provider Last Rate Last Dose  . 0.9 %  sodium chloride infusion  250 mL Intravenous PRN Theressa Millard, MD      . dextrose 50 % solution 25-50 mL  25-50 mL Intravenous PRN Florencia Reasons, MD   50 mL at 05/16/14 0850  . furosemide (LASIX) injection 80 mg  80 mg Intravenous Q12H Florencia Reasons, MD   Stopped at  05/16/14 1030  . HYDROmorphone (DILAUDID) injection 0.5 mg  0.5 mg Intravenous Q4H PRN Florencia Reasons, MD   0.5 mg at 05/16/14 1030  . insulin aspart (novoLOG) injection 0-9 Units  0-9 Units Subcutaneous 6 times per day Theressa Millard, MD   0 Units at 05/16/14 0800  . ipratropium-albuterol (DUONEB) 0.5-2.5 (3) MG/3ML nebulizer solution 3 mL  3 mL Nebulization Q4H Harvette C Jenkins, MD      . lactulose (CHRONULAC) 10 GM/15ML solution 10 g  10 g Oral BID PRN Florencia Reasons, MD      . Derrill Memo ON 05/18/2014] levofloxacin (LEVAQUIN) IVPB 750 mg  750 mg Intravenous Q48H Florencia Reasons, MD      . metolazone (ZAROXOLYN) tablet 2.5 mg  2.5 mg Oral BID Theressa Millard, MD   2.5 mg at 05/16/14 1030  . ondansetron (ZOFRAN) injection 4 mg  4 mg Intravenous Q6H PRN Harvette C Jenkins, MD      . sodium chloride 0.9 % injection 3 mL  3 mL Intravenous Q12H Theressa Millard, MD   3 mL at 05/16/14 1032  . sodium chloride 0.9 % injection 3 mL  3 mL Intravenous PRN Theressa Millard, MD        Allergies as of 05/15/2014 - Review Complete 05/15/2014  Allergen Reaction Noted  . Acetaminophen Other (See Comments) 06/23/2013  . Morphine Other (See Comments) 09/15/2006  . Morphine and related Other (See Comments) 11/27/2013  . Other Other (See Comments) 02/10/2013  . Penicillins Anaphylaxis and Rash 06/03/2006  . Trazodone and nefazodone Other (See Comments) 09/03/2013  . Codeine phosphate Other (See Comments) 06/03/2006  . Hydrocodone-acetaminophen Other (See Comments) 06/03/2006  . Cephalexin Swelling  and Rash 06/03/2006  . Hydrocodone-acetaminophen Other (See Comments) 06/23/2013    Family History  Problem Relation Age of Onset  . Coronary artery disease    . Diabetes type II    . Heart attack Mother   . Diabetes type II Mother   . Cirrhosis Father   . Heart attack Sister   . Pancreatic cancer Mother     secondary from surgery  . Cervical cancer Maternal Grandmother   . Hypertension Father   . Hypertension Sister    . Hypertension Son   . Hypertension Daughter   . Stroke Neg Hx     History   Social History  . Marital Status: Divorced    Spouse Name: N/A  . Number of Children: N/A  . Years of Education: N/A   Occupational History  . Disabled    Social History Main Topics  . Smoking status: Former Smoker -- 2.00 packs/day for 35 years    Types: Cigarettes    Quit date: 03/24/2002  . Smokeless tobacco: Never Used  . Alcohol Use: No  . Drug Use: No  . Sexual Activity: No   Other Topics Concern  . Not on file   Social History Narrative   Regular exercise: a little   Caffeine use: 2 cups of coffee in am          Review of Systems: Gen: Admits to weight gain, fever, chills, generalized weakness. CV: admits to increased edema in lower extremities, anasarca, orthopnea. Resp: Admits to dyspnea at rest as well as dyspnea on exertion. Admits to nonproductive cough and wheezing GI: Admits to intermittent bleeding from stoma GU : Denies urinary burning, blood in urine, urinary frequency, urinary hesitancy, nocturnal urination, and urinary incontinence. MS: Denies joint pain Derm: Denies rash Psych: Denies depression Heme: Denies bruisingand enlarged lymph nodes. Neuro:  Denies any headaches,  paresthesias.   Physical Exam: Vital signs in last 24 hours: Temp:  [99.9 F (37.7 C)] 99.9 F (37.7 C) (04/17 2237) Pulse Rate:  [60-71] 65 (04/18 1000) Resp:  [13-26] 19 (04/18 1000) BP: (95-154)/(26-103) 124/41 mmHg (04/18 1000) SpO2:  [96 %-100 %] 100 % (04/18 1000) Weight:  [230 lb 8 oz (104.554 kg)-235 lb 14.3 oz (107 kg)] 235 lb 14.3 oz (107 kg) (04/18 0819)   General:   Alert,  obese female with) on, cooperative, asking for pain meds. Head:  Normocephalic and atraumatic. Eyes:  Sclera clear, no icterus  Conjunctiva pink. Ears:  Normal auditory acuity. Nose:  No deformity, discharge,  or lesions. Mouth:  No deformity or lesions.   Neck:  Supple; no masses or thyromegaly. Lungs:  Decreased breath sounds throughout  Heart:  Regular rate and rhythm, no murmurs Abdomen: Soft, BS present. Diffuse moderate TTP. Ostomy noted on left side with brown stool.    Rectal:  Deferred  Msk:  Symmetrical without gross deformities. . Pulses:  Normal pulses noted. Extremities: 2-3+ edema LE Neurologic:Alert and  oriented x4;  grossly normal neurologically. Skin:  Intact without significant lesions or rashes.. Psych:  Alert and cooperative. Normal mood and affect.  Intake/Output from previous day: 04/17 0701 - 04/18 0700 In: -  Out: 600 [Urine:600] Intake/Output this shift:    Lab Results:  Recent Labs  05/13/14 1132 05/15/14 2255  WBC 14.8* 16.0*  HGB 7.2* 7.1*  HCT 22.7* 22.1*  PLT 433* 513*   BMET  Recent Labs  05/13/14 1132 05/15/14 2255  NA 131* 129*  K 5.2* 4.8  CL  --  96  CO2 19* 22  GLUCOSE 133 82  BUN 45.0* 43*  CREATININE 1.4* 1.51*  CALCIUM 8.4 9.9   LFT  Recent Labs  05/15/14 2322  PROT 7.8  ALBUMIN 3.7  AST 40*  ALT 29  ALKPHOS 113  BILITOT 0.4  BILIDIR <0.1  IBILI NOT CALCULATED   Ammonia on 05/15/2014  89 Ferritin on April 15 24 Iron 12, TIBC 433, U IBC 421, percent saturation 3.  Studies/Results: Ct Abdomen Pelvis Wo Contrast  05/16/2014   CLINICAL DATA:  Generalized weakness, shortness of breath  EXAM: CT ABDOMEN AND PELVIS WITHOUT CONTRAST  TECHNIQUE: Multidetector CT imaging of the abdomen and pelvis was performed following the standard protocol without IV contrast.  COMPARISON:  04/20/2014 radiograph 04/19/2014 CT  FINDINGS: Small right pleural effusion. Mild bibasilar airspace opacities, predominantly linear and lobular subpleural on the left partially imaged cardiac leads within the right atrium and ventricle.  Organ evaluation is limited in the absence of intravenous contrast. Within this limitation, cirrhotic liver morphology. Cholecystectomy. No intrahepatic biliary ductal dilatation mild extrahepatic biliary ductal  prominence is nonspecific.  Absent spleen. Nodular soft tissue density left upper quadrant presumably reflects residual splenules or regenerative splenic tissue. No appreciable abnormality of the pancreas. Bilateral adrenal gland nodularity without a dominant/measurable nodule. Symmetric renal size. Mild bilateral perinephric fat stranding. No hydroureteronephrosis.  Hartmann pouch. Left lower quadrant colostomy. The small bowel loops are of normal caliber. Anterior abdominal wall adhesions. Parastomal hernia containing loops of small bowel without evidence for obstruction or incarceration.  Hysterectomy. 8.8 x 4.3 cm lobulated mass or complex fluid within the operative bed is similar to slightly increased from the prior. Thin walled bladder.  Advanced atherosclerotic disease of the abdominal aorta and branch vessels.  Diffuse anasarca.  Mild multilevel degenerative changes.  IMPRESSION: Left lower quadrant colostomy. Small bowel containing parastomal hernia. No bowel obstruction.  8.8 x 4.3 cm fluid collection or mass within the hysterectomy bed, similar to slightly increased since the prior. Per prior report, this is previously thought to be of ovarian origin.  Cirrhosis. Mild extrahepatic biliary ductal dilatation is nonspecific post cholecystectomy. Correlate with LFTs and ERCP if warranted.  Small right pleural effusion. Mild bibasilar airspace opacities; atelectasis and/or pneumonia. Small right pleural effusion.   Electronically Signed   By: Carlos Levering M.D.   On: 05/16/2014 05:39   Dg Chest Portable 1 View  05/15/2014   CLINICAL DATA:  Shortness of breath  EXAM: PORTABLE CHEST - 1 VIEW  COMPARISON:  04/20/2014, 04/21/2013  FINDINGS: Hypoaeration with interstitial and vascular crowding. Central vascular congestion. Aortic atherosclerosis. Enlarged cardiac silhouette. Mild retrocardiac opacity is similar to prior and increased from 2015. Small left pleural effusion not excluded. Underpenetration and  osteopenia limits osseous assessment. No overt interval change. Left chest wall battery pack with unchanged lead tip positions.  IMPRESSION: Central vascular congestion and interstitial prominence may reflect chronic change or atypical/viral infection versus pulmonary edema if acute.  Mild left lower lobe airspace opacity is nonspecific; atelectasis, scarring, or pneumonia. Recommend attention on follow-up with PA and lateral views in 3-4 weeks.   Electronically Signed   By: Carlos Levering M.D.   On: 05/15/2014 23:32           IMPRESSION/PLAN: #1. Cirrhosis. Patient had been on Chronulac and Xifaxan last admission. Follow ammonia levels and monitor LFTs. If ammonia remains high patient develops confusion while still not taking by mouth, consider lactulose enema. Restart by mouth lactulose and Xifaxan when taking  po.  #2. Community-acquired pneumonia. Currently on IV Levaquin and nebulizer treatments as well as BiPAP  #3. Diastolic CHF. On IV Lasix every 12 hours. Monitor electrolytes.  #4 chronic intermittent anemia requiring periodic transfusions and bleeding per ostomy. Extensive workup with findings of portal gastropathy, gastric angiomas ectasia, peristomal hernia. Will review with attending and S2 any further workup at this time.   Mical Brun, Deloris Ping 05/16/2014,  Pager [phone]

## 2014-05-16 NOTE — ED Notes (Signed)
Second [place] IV infiltrated, MD notified.

## 2014-05-16 NOTE — Progress Notes (Addendum)
PROGRESS NOTE  CHRISA HASSAN GGY:[id number] DOB: 1950-11-27 DOA: 05/15/2014 PCP: [name], MD  HPI/Recap of past 24 hours:  Currently on bipap, reported feeling better, asking for dilaudid for chronic ab pain, reported has been on oxycodone 20mg  1-2 tabs q3hrs prn at home for the last year for chronic ab pain. Poor IV access, may need piccline , patient agree with picc if needed.  Assessment/Plan: Principal Problem:   Acute respiratory failure Active Problems:   Type 2 diabetes, uncontrolled, with neuropathy   Non-alcoholic cirrhosis   CAP (community acquired pneumonia)   SOB (shortness of breath)   Acute diastolic CHF (congestive heart failure)   Cirrhosis   Hypotension due to drugs   Congestive heart disease   Iron deficiency anemia due to chronic blood loss  1. Acute respiratory failure/SOB (shortness of breath)- Due to #3, and #2 improved on BIPAP/nebs/abx/lasix, lung exam with good aeration, no wheezing this am, does still has few crackles at bases, wean off bipap, continue home o2 2liter 24/7. restart diet. Keep in stepdown for one more day     2. Acute on chronic diastolic CHF (congestive heart failure) (h/o bradycardia, sinus node dysfunction s/p pacemaker 08/2013) Acute CHF Protocol initially on admission given lasix 40bid, changed to IV Lasix 80 mg q 12 hrs on 4/18, review outpatient cards clinic note, patient lasix dose was increased to 120bid recently, cards consulted for chf management. Monitor Electrolytes, echo pending  3. Hyponatremia: likely from volume overload, treat chf.  4. AKI, cr baseline from 1.19 to 1.45, today 1.51, from chf? Will check ua, renal dosing meds.   5. CAP (community acquired pneumonia) IV Levaquin, sputum  culture Albuterol Nebs O2    6. Type 2 diabetes, uncontrolled, with neuropathy SSI coverage PRN HbA1C pending     7. Non-alcoholic cirrhosis/Cirrhosis/elevated ammonia level ammonia elevated at 89, Resume rifaximin and lactulose when taking PO Monitor LFTs                         GI consulted    8. 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    9. Right ovarian mass: was recently evaluated by [facility] in 03/2014, determined less likely malignancy inaddition to poor surgical candidate, recommend repeat ultrasound in 56months.       10. FTT on home oxygen, reported recent fall x1 due to weakness, will get PT eval once stablizd.   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, requiring blood transfusion  Code Status: full  Family Communication: patient,   Disposition Plan: remain in stepdown   Consultants:  GI  cardiology  Procedures:  bipap  Antibiotics:  levaquin   Objective: BP 121/65 mmHg  Pulse 68  Temp(Src) 99.9 F (37.7 C) (Oral)  Resp 16  Wt 104.554 kg (230 lb 8 oz)  SpO2 100%  Intake/Output Summary (Last 24 hours) at 05/16/14 0828 Last data filed at 05/16/14 0149  Gross per 24 hour  Intake      0 ml  Output    600 ml  Net   -600 ml   Filed Weights   05/16/14 0152  Weight: 104.554 kg (230 lb 8 oz)    Exam:   General:  Obese, on bipap, NAD  Cardiovascular: RRR  Respiratory: CTABL, few crackles at bases,  no wheezing, no rhonchi  Abdomen: Soft/ND/NT, positive BS, +colostomy, peristomal  hernia, small ventral hernia, all nontender. No visible blood in colostomy bag during exam today  Musculoskeletal: 1-2+ pitting edema bilateral lower extremity  Neuro: aaox3  Data Reviewed: Basic Metabolic Panel:  Recent Labs Lab 05/13/14 1132 05/15/14 2255  NA 131* 129*  K 5.2* 4.8  CL  --  96  CO2 19* 22  GLUCOSE 133 82  BUN 45.0* 43*  CREATININE 1.4* 1.51*  CALCIUM 8.4 9.9   Liver Function Tests:  Recent Labs Lab 05/13/14 1132 05/15/14 2322  AST 42* 40*  ALT 32 29  ALKPHOS 115 113  BILITOT 0.29 0.4  PROT 7.4 7.8  ALBUMIN 3.5 3.7   No results for input(s): LIPASE, AMYLASE in the last 168 hours.  Recent Labs Lab 05/15/14 2237  AMMONIA 89*   CBC:  Recent Labs Lab 05/13/14 1132 05/15/14 2255  WBC 14.8* 16.0*  NEUTROABS 8.3*  --   HGB 7.2* 7.1*  HCT 22.7* 22.1*  MCV 84.7 83.7  PLT 433* 513*   Cardiac Enzymes:   No results for input(s): CKTOTAL, CKMB, CKMBINDEX, TROPONINI in the last 168 hours. BNP (last 3 results)  Recent Labs  03/04/14 2100 04/12/14 0710 05/15/14 2322  BNP 35.8 311.9* 918.6*    ProBNP (last 3 results)  Recent Labs  07/16/13 0650 09/02/13 1803 03/30/14 1230  PROBNP 933.6* 558.0* 71.0    CBG:  Recent Labs Lab 05/16/14 0820  GLUCAP 41*    No results found for this or any previous visit (from the past 240 hour(s)).   Studies: Dg Chest Portable 1 View  05/15/2014   CLINICAL DATA:  Shortness of breath  EXAM: PORTABLE CHEST - 1 VIEW  COMPARISON:  04/20/2014, 04/21/2013  FINDINGS: Hypoaeration with interstitial and vascular crowding. Central vascular congestion. Aortic atherosclerosis. Enlarged cardiac silhouette. Mild retrocardiac opacity is similar to prior and increased from 2015. Small left pleural effusion not excluded. Underpenetration and osteopenia limits osseous assessment. No overt interval change. Left chest wall battery pack with unchanged lead tip positions.  IMPRESSION: Central vascular congestion and  interstitial prominence may reflect chronic change or atypical/viral infection versus pulmonary edema if acute.  Mild left lower lobe airspace opacity is nonspecific; atelectasis, scarring, or pneumonia. Recommend attention on follow-up with PA and lateral views in 3-4 weeks.   Electronically Signed   By: Carlos Levering M.D.   On: 05/15/2014 23:32    Scheduled Meds: . sodium chloride   Intravenous Once  . furosemide  80 mg Intravenous Q12H  . insulin aspart  0-9 Units Subcutaneous 6 times per day  . ipratropium-albuterol  3 mL Nebulization Q4H  . [START ON 05/18/2014] levofloxacin (LEVAQUIN) IV  750 mg Intravenous Q48H  . metolazone  2.5 mg Oral BID  . sodium chloride  3 mL Intravenous Q12H    Continuous Infusions:    Time spent: >24mins from 10:30 am to 11:20  Meeyah Ovitt MD, PhD  [facility] Pager [phone]. If 7PM-7AM, please contact night-coverage at [email or web address], password [facility] 05/16/2014, 8:28 AM  LOS: 0 days

## 2014-05-16 NOTE — H&P (Signed)
[facility] Admission History and Physical       [name] HGD:[id number] DOB: November 12, 1950 DOA: 05/15/2014  Referring physician:  PCP: [name], MD  Specialists:   Chief Complaint: Worsening SOB and Swelling  HPI: [name] is a 64 y.o. female with a history of NASH Cirrhosis, COPD, Chronic Diastolic CHF, Chronic Respiratory Failure, DM2, HTN, Chronic ABD Pain who presented to the ED with complaints of worsening SOB x 2 days.  She also reports that she has had increased edema and orthopnea and DOE.[name]  She has also had chest congestion with nonproductive cough and chest congestion and wheezing. She was evaluated in the ED and was found to have bilateral opacities on chest X-ray as well as finding of CHF.   Her NCO2 had been increased to 4 liters from 3 liters (that she wears at home) due to hypoxia, and in the ED she was administered 80 mg of IV Lasix x 1.   She was also placed on IV Levaquin for CAP, and administered Nebs.   Her breathing remained labored, and she was placed on BiPAP and had improvement in her breathing, and was referred for medical admisson.       Review of Systems:  Constitutional: No Weight Loss, +Weight Gain, Night Sweats, Fevers, Chills, Dizziness, Light Headedness, Fatigue, +Generalized Weakness HEENT: No Headaches, Difficulty Swallowing,Tooth/Dental Problems,Sore Throat,  No Sneezing, Rhinitis, Ear Ache, Nasal Congestion, or Post Nasal Drip,  Cardio-vascular:  No Chest pain, +Orthopnea, +PND, +Edema in Lower Extremities, +Anasarca, Dizziness, Palpitations  Resp: +Dyspnea, +DOE, No Productive Cough, +Non-Productive Cough, No Hemoptysis, +Wheezing.    GI: No Heartburn, Indigestion, +Abdominal Pain, Nausea, Vomiting, Diarrhea, Constipation, Hematemesis, Hematochezia, Melena, Change in Bowel Habits,  Loss of Appetite  GU: No Dysuria, No Change in Color of Urine, No Urgency or Urinary Frequency, No Flank pain.  Musculoskeletal: No Joint Pain or  Swelling, No Decreased Range of Motion, No Back Pain.  Neurologic: No Syncope, No Seizures, Muscle Weakness, Paresthesia, Vision Disturbance or Loss, No Diplopia, No Vertigo, No Difficulty Walking,  Skin: No Rash or Lesions. Psych: No Change in Mood or Affect, No Depression or Anxiety, No Memory loss, No Confusion, or Hallucinations   Past Medical History  Diagnosis Date  . Cirrhosis   . GERD (gastroesophageal reflux disease)   . Cervical disc syndrome     trouble turnng neck at times  . Chronic respiratory failure   . Diverticulitis of colon   . Perforation of colon   . IBS (irritable bowel syndrome)   . Chronic lower GI bleeding   . Overactive bladder   . Thrombocytopenia     sees Dr. Julien Nordmann   . Splenomegaly   . Depression   . Hypertension   . Asthma   . Heart murmur   . Hyperlipidemia   . COPD (chronic obstructive pulmonary disease)     sees Dr. Gwenette Greet   . Colostomy care   . Hypercalcemia   . CHF (congestive heart failure)   . Pacemaker 12/14/2013  . 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 Cervical cancer many years ago  . Complication of anesthesia     woke up during colonscopy and endoscopy in past  . History of blood transfusion "several"    "bleeding via ostomy" (01/04/2014)  . On home oxygen therapy     "2L; 24/7" (01/04/2014)  . Pneumonia "1-2 times"  . Chronic bronchitis "several times"  . Sleep apnea 2012  mild, no cpap needed  . Type II diabetes mellitus     sees Dr. Cruzita Lederer   . Chronic disease anemia     sees Dr. Julien Nordmann, due to chronic disease and GI losses   . History of stomach ulcers   . Migraine     "last one was several years ago" (01/04/2014)  . Arthritis     "tailbone; hands; legs" (01/04/2014)  . Anxiety   . Pericardial effusion 2007, 2015.   [name] Chronic abdominal pain     IBS  . Cirrhosis of liver     stage 4     Past Surgical History  Procedure Laterality Date  . Colostomy  11/06/2005  . Lung removal, partial  Left 2004    upper lobe removed  . Left colectomy  11/06/2005    Hartmann resection of sigmoid colon and end colostomy.  . Esophagogastroduodenoscopy  03/19/2011    Procedure: ESOPHAGOGASTRODUODENOSCOPY (EGD);  Surgeon: [name], MD;  Location: [facility];  Service: Endoscopy;  Laterality: N/A;  . Colonoscopy  03/19/2011    Procedure: COLONOSCOPY;  Surgeon: [name], MD;  Location: [facility];  Service: Endoscopy;  Laterality: N/A;  . Givens capsule study  03/20/2011    Procedure: GIVENS CAPSULE STUDY;  Surgeon: [name], MD;  Location: [facility];  Service: Endoscopy;  Laterality: N/A;  . Small bowel obstruction repair  March 2012  . Umbilical hernia repair  March 2012  . Splenectomy, total N/A 02/24/2013    Procedure: SPLENECTOMY;  Surgeon: Harl Bowie, MD;  Location: [place];  Service: General;  Laterality: N/A;  . Orif patella Left 04/21/2013    Procedure: OPEN REDUCTION INTERNAL (ORIF) FIXATION PATELLA;  Surgeon: Augustin Schooling, MD;  Location: [place];  Service: Orthopedics;  Laterality: Left;  . Pacemaker insertion  2015  . Esophagogastroduodenoscopy (egd) with propofol N/A 11/02/2013    Procedure: ESOPHAGOGASTRODUODENOSCOPY (EGD) WITH PROPOFOL;  Surgeon: [name], MD;  Location: [facility];  Service: Endoscopy;  Laterality: N/A;  EGD with APC  . Hot hemostasis N/A 11/02/2013    Procedure: HOT HEMOSTASIS (ARGON PLASMA COAGULATION/BICAP);  Surgeon: [name], MD;  Location: [facility];  Service: Endoscopy;  Laterality: N/A;  . Colon surgery    . Hernia repair    . Fracture surgery    . Abdominal hysterectomy    . Tubal ligation    . Tonsillectomy  1959  . Appendectomy  1962  . Cholecystectomy    . Cardiac catheterization  1967  . Permanent pacemaker insertion N/A 09/07/2013    Procedure: PERMANENT PACEMAKER INSERTION;  Surgeon: Evans Lance, MD;  Location: [facility] CATH LAB;  Service: Cardiovascular;  Laterality: N/A;  . Tee without  cardioversion N/A 01/06/2014    Procedure: TRANSESOPHAGEAL ECHOCARDIOGRAM (TEE);  Surgeon: Lelon Perla, MD;  Location: [facility] END[facility];  Service: Cardiovascular;  Laterality: N/A;  . Colostomy  11/06/2005    OPERATIVE REPORT      Prior to Admission medications   Medication Sig Start Date End Date Taking? Authorizing Provider  albuterol (PROVENTIL) (2.5 MG/3ML) 0.083% nebulizer solution Take 3 mLs (2.5 mg total) by nebulization every 4 (four) hours as needed for wheezing or shortness of breath. 03/02/14  Yes [name], MD  albuterol-ipratropium (COMBIVENT) 18-103 MCG/ACT inhaler Inhale 2 puffs into the lungs every 4 (four) hours as needed for wheezing or shortness of breath.    Yes Historical Provider, MD  atorvastatin (LIPITOR) 20 MG tablet Take 1 tablet (20  mg total) by mouth every morning. 03/02/14  Yes [name], MD  diazepam (VALIUM) 5 MG tablet Take 1 tablet (5 mg total) by mouth 3 (three) times daily as needed. Patient taking differently: Take 5 mg by mouth 3 (three) times daily as needed for anxiety.  03/02/14  Yes [name], MD  dicyclomine (BENTYL) 10 MG capsule Take 10 mg by mouth 3 (three) times daily.    Yes Historical Provider, MD  esomeprazole (NEXIUM) 40 MG capsule Take 40 mg by mouth daily.  03/02/14  Yes Historical Provider, MD  Fluticasone-Salmeterol (ADVAIR) 250-50 MCG/DOSE AEPB Inhale 1 puff into the lungs every 12 (twelve) hours. 03/02/14  Yes [name], MD  folic acid (FOLVITE) 1 MG tablet TAKE ONE TABLET BY MOUTH ONCE DAILY IN THE EVENING 05/10/14  Yes [name], MD  furosemide (LASIX) 40 MG tablet Take 2 tablets (80 mg total) by mouth 2 (two) times daily. 03/30/14  Yes Liliane Shi, PA-C  hydrALAZINE (APRESOLINE) 25 MG tablet Take 1.5 tablets (37.5 mg total) by mouth 2 (two) times daily. 03/02/14  Yes [name], MD  insulin regular human CONCENTRATED (HUMULIN R) 500 UNIT/ML SOLN injection Inject under skin 0.33 mL before b'fast, 0.33 mL before lunch and 0.30  mL before dinner Patient taking differently: Inject 0.3-0.33 Units into the skin 3 (three) times daily with meals. Inject under skin 0.33 mL before b'fast, 0.33 mL before lunch and 0.30 mL before dinner 03/01/14  Yes Philemon Kingdom, MD  INSULIN SYRINGE 1CC/29G (B-D INSULIN SYRINGE) 29G X 1/2" 1 ML MISC Use to inject insulin 3 times daily. 03/01/14  Yes Philemon Kingdom, MD  isosorbide dinitrate (ISORDIL) 20 MG tablet Take 1 tablet (20 mg total) by mouth 2 (two) times daily. 10/13/13  Yes [name], MD  lactulose (CEPHULAC) 10 G packet Take 1 packet (10 g total) by mouth 2 (two) times daily between meals as needed (titrate to at least 2 BMs daily). 04/21/14  Yes Nishant Dhungel, MD  losartan (COZAAR) 50 MG tablet Take 1 tablet (50 mg total) by mouth daily. 03/18/14  Yes Scott Joylene Draft, PA-C  methocarbamol (ROBAXIN) 500 MG tablet Take 1 tablet (500 mg total) by mouth every 6 (six) hours as needed for muscle spasms. 05/03/13  Yes Ivan Anchors Love, PA-C  metoCLOPramide (REGLAN) 10 MG tablet Take 1 tablet (10 mg total) by mouth 3 (three) times daily. 04/12/13  Yes [name], MD  metolazone (ZAROXOLYN) 2.5 MG tablet Take 2.5 mg by mouth daily.  03/02/14  Yes Historical Provider, MD  Oxycodone HCl 20 MG TABS Take 1 tablet (20 mg total) by mouth every 3 (three) hours as needed (pain). 03/02/14  Yes [name], MD  OXYGEN Inhale 2 L into the lungs continuous.   Yes Historical Provider, MD  polyethylene glycol (MIRALAX / GLYCOLAX) packet Take 17 g by mouth every 12 (twelve) hours as needed for moderate constipation or severe constipation. 04/21/14  Yes Nishant Dhungel, MD  potassium chloride (KLOR-CON 10) 10 MEQ tablet Take 1 tablet (10 mEq total) by mouth daily. 03/16/14  Yes [name], MD  promethazine (PHENERGAN) 25 MG tablet Take 1 tablet (25 mg total) by mouth every 4 (four) hours as needed for nausea or vomiting. 11/23/13  Yes [name], MD  rifaximin (XIFAXAN) 550 MG TABS tablet Take 1 tablet (550 mg  total) by mouth 2 (two) times daily. 04/21/14  Yes Nishant Dhungel, MD  saccharomyces boulardii (  FLORASTOR) 250 MG capsule Take 1 capsule (250 mg total) by mouth 2 (two) times daily. 04/21/14  Yes Nishant Dhungel, MD  fluconazole (DIFLUCAN) 150 MG tablet Take 1 tablet (150 mg total) by mouth once. Patient not taking: Reported on 05/15/2014 04/28/14   Eulas Post, MD     Allergies  Allergen Reactions  . Acetaminophen Other (See Comments)    Cirrhosis of liver  . Morphine Other (See Comments)    REACTION: Lowers BP  . Morphine And Related Other (See Comments)    Blood pressure drops   . Other Other (See Comments)    AGENT:  Per pt, CANNOT TAKE ANY FORM OF BLOOD THINNER, due to cirrhosis of the liver  . Penicillins Anaphylaxis and Rash  . Trazodone And Nefazodone Other (See Comments)    Cardiac arrythmia - DO NOT USE  . Codeine Phosphate Other (See Comments)    REACTION: Stomach cramps  . Hydrocodone-Acetaminophen Other (See Comments)    REACTION: hallucinations  . Cephalexin Swelling and Rash  . Hydrocodone-Acetaminophen Other (See Comments)    unknown    Social History:  reports that she quit smoking about 12 years ago. Her smoking use included Cigarettes. She has a 70 pack-year smoking history. She has never used smokeless tobacco. She reports that she does not drink alcohol or use illicit drugs.    Family History  Problem Relation Age of Onset  . Coronary artery disease    . Diabetes type II    . Heart attack Mother   . Diabetes type II Mother   . Cirrhosis Father   . Heart attack Sister   . Pancreatic cancer Mother     secondary from surgery  . Cervical cancer Maternal Grandmother   . Hypertension Father   . Hypertension Sister   . Hypertension Son   . Hypertension Daughter   . Stroke Neg Hx        Physical Exam:  GEN:  Pleasant Obese and Edematous 64 y.o. Caucasian  female examined and in acute distress; cooperative with exam Filed Vitals:   05/16/14 0500  05/16/14 0515 05/16/14 0600 05/16/14 0638  BP: 114/36 116/32 116/103 95/30  Pulse: 65 69 65 60  Temp:      TempSrc:      Resp:    17  Weight:      SpO2: 97% 97% 96% 100%   Blood pressure 95/30, pulse 60, temperature 99.9 F (37.7 C), temperature source Oral, resp. rate 17, weight 104.554 kg (230 lb 8 oz), SpO2 100 %. PSYCH: She is alert and oriented x4; does not appear anxious does not appear depressed; affect is normal HEENT: Normocephalic and Atraumatic, Mucous membranes pink; PERRLA; EOM intact; Fundi:  Benign;  No scleral icterus, Nares: Patent, Oropharynx: Clear,     Neck:  FROM, No Cervical Lymphadenopathy nor Thyromegaly or Carotid Bruit; No JVD; Breasts:: Not examined CHEST WALL: No tenderness CHEST: Tachypneic,  Decreased Breath Sounds,   HEART: Regular rate and rhythm; no murmurs rubs or gallops BACK: No kyphosis or scoliosis; No CVA tenderness ABDOMEN: Positive Bowel Sounds, Obese, Soft and Distended , Tender in Lower Quadrants, No Rebound or Guarding; No Masses, No Organomegaly. Rectal Exam: Not done EXTREMITIES: No Cyanosis, Clubbing, 2-3+ Edema; No Ulcerations. Genitalia: not examined PULSES: 2+ and symmetric SKIN: Normal hydration no rash or ulceration CNS:  Alert and Oriented x 4, No Focal Deficits Vascular: pulses palpable throughout   Labs on Admission:  Basic Metabolic Panel:  Recent Labs Lab 05/13/14  1132 05/15/14 2255  NA 131* 129*  K 5.2* 4.8  CL  --  96  CO2 19* 22  GLUCOSE 133 82  BUN 45.0* 43*  CREATININE 1.4* 1.51*  CALCIUM 8.4 9.9   Liver Function Tests:  Recent Labs Lab 05/13/14 1132 05/15/14 2322  AST 42* 40*  ALT 32 29  ALKPHOS 115 113  BILITOT 0.29 0.4  PROT 7.4 7.8  ALBUMIN 3.5 3.7   No results for input(s): LIPASE, AMYLASE in the last 168 hours.  Recent Labs Lab 05/15/14 2237  AMMONIA 89*   CBC:  Recent Labs Lab 05/13/14 1132 05/15/14 2255  WBC 14.8* 16.0*  NEUTROABS 8.3*  --   HGB 7.2* 7.1*  HCT 22.7* 22.1*    MCV 84.7 83.7  PLT 433* 513*   Cardiac Enzymes: No results for input(s): CKTOTAL, CKMB, CKMBINDEX, TROPONINI in the last 168 hours.  BNP (last 3 results)  Recent Labs  03/04/14 2100 04/12/14 0710 05/15/14 2322  BNP 35.8 311.9* 918.6*    ProBNP (last 3 results)  Recent Labs  07/16/13 0650 09/02/13 1803 03/30/14 1230  PROBNP 933.6* 558.0* 71.0    CBG: No results for input(s): GLUCAP in the last 168 hours.  Radiological Exams on Admission: Ct Abdomen Pelvis Wo Contrast  05/16/2014   CLINICAL DATA:  Generalized weakness, shortness of breath  EXAM: CT ABDOMEN AND PELVIS WITHOUT CONTRAST  TECHNIQUE: Multidetector CT imaging of the abdomen and pelvis was performed following the standard protocol without IV contrast.  COMPARISON:  04/20/2014 radiograph 04/19/2014 CT  FINDINGS: Small right pleural effusion. Mild bibasilar airspace opacities, predominantly linear and lobular subpleural on the left partially imaged cardiac leads within the right atrium and ventricle.  Organ evaluation is limited in the absence of intravenous contrast. Within this limitation, cirrhotic liver morphology. Cholecystectomy. No intrahepatic biliary ductal dilatation mild extrahepatic biliary ductal prominence is nonspecific.  Absent spleen. Nodular soft tissue density left upper quadrant presumably reflects residual splenules or regenerative splenic tissue. No appreciable abnormality of the pancreas. Bilateral adrenal gland nodularity without a dominant/measurable nodule. Symmetric renal size. Mild bilateral perinephric fat stranding. No hydroureteronephrosis.  Hartmann pouch. Left lower quadrant colostomy. The small bowel loops are of normal caliber. Anterior abdominal wall adhesions. Parastomal hernia containing loops of small bowel without evidence for obstruction or incarceration.  Hysterectomy. 8.8 x 4.3 cm lobulated mass or complex fluid within the operative bed is similar to slightly increased from the prior.  Thin walled bladder.  Advanced atherosclerotic disease of the abdominal aorta and branch vessels.  Diffuse anasarca.  Mild multilevel degenerative changes.  IMPRESSION: Left lower quadrant colostomy. Small bowel containing parastomal hernia. No bowel obstruction.  8.8 x 4.3 cm fluid collection or mass within the hysterectomy bed, similar to slightly increased since the prior. Per prior report, this is previously thought to be of ovarian origin.  Cirrhosis. Mild extrahepatic biliary ductal dilatation is nonspecific post cholecystectomy. Correlate with LFTs and ERCP if warranted.  Small right pleural effusion. Mild bibasilar airspace opacities; atelectasis and/or pneumonia. Small right pleural effusion.   Electronically Signed   By: Carlos Levering M.D.   On: 05/16/2014 05:39   Dg Chest Portable 1 View  05/15/2014   CLINICAL DATA:  Shortness of breath  EXAM: PORTABLE CHEST - 1 VIEW  COMPARISON:  04/20/2014, 04/21/2013  FINDINGS: Hypoaeration with interstitial and vascular crowding. Central vascular congestion. Aortic atherosclerosis. Enlarged cardiac silhouette. Mild retrocardiac opacity is similar to prior and increased from 2015. Small left pleural effusion not excluded.  Underpenetration and osteopenia limits osseous assessment. No overt interval change. Left chest wall battery pack with unchanged lead tip positions.  IMPRESSION: Central vascular congestion and interstitial prominence may reflect chronic change or atypical/viral infection versus pulmonary edema if acute.  Mild left lower lobe airspace opacity is nonspecific; atelectasis, scarring, or pneumonia. Recommend attention on follow-up with PA and lateral views in 3-4 weeks.   Electronically Signed   By: Carlos Levering M.D.   On: 05/15/2014 23:32     EKG: Independently reviewed.    Assessment/Plan:   64 y.o. female with  Principal Problem:   1.   Acute respiratory failure/SOB (shortness of breath)- Due to #3, and #2   BIPAP   Monitor O2  sats   NPO while on BIPAP      Active Problems:   2.   CAP (community acquired pneumonia)   IV Levaquin   Albuterol Nebs   O2     3.   Acute diastolic CHF (congestive heart failure)   Acute CHF Protocol    Diurese with IV Lasix  40 mg q 12 hrs   Monitor Electrolytes       4.   Type 2 diabetes, uncontrolled, with neuropathy   SSI coverage PRN   Check HbA1C in AM        5.   Non-alcoholic cirrhosis/Cirrhosis   Monitor NH3 Levels    Resume Chronulac when taking PO   Monitor LFTs     6.   Hypotension due to drugs- Transient, due to Initial  IV Lasix given   Hold BP Meds if SBP < 100   Monitor BPs        7.   DVT Prophylaxis   SCDs         Code Status:     FULL CODE       Family Communication:    No Family Present    Disposition Plan:    Inpatient  Status        Time spent: [facility] Pager [phone]   If [place] Please Contact the Day Rounding Team MD for [facility]  If 7PM-7AM, Please Contact Night-Floor Coverage  [email or web address] Password [email or web address] 05/16/2014, 6:48 AM     ADDENDUM:   Patient was seen and examined on 05/16/2014

## 2014-05-16 NOTE — Progress Notes (Signed)
Pt currently off bipap, no increased wob or respiratory distress noted or voiced by pt.  Pt remains on 4lnc, HR 69, rr12, spo2 98%.  Bipap remains in room on standby.  RT will continue to monitor and assess as needed.

## 2014-05-17 ENCOUNTER — Ambulatory Visit: Payer: Medicare Other | Admitting: Internal Medicine

## 2014-05-17 DIAGNOSIS — E1140 Type 2 diabetes mellitus with diabetic neuropathy, unspecified: Secondary | ICD-10-CM

## 2014-05-17 DIAGNOSIS — I509 Heart failure, unspecified: Secondary | ICD-10-CM

## 2014-05-17 DIAGNOSIS — E1165 Type 2 diabetes mellitus with hyperglycemia: Secondary | ICD-10-CM

## 2014-05-17 DIAGNOSIS — K7460 Unspecified cirrhosis of liver: Secondary | ICD-10-CM

## 2014-05-17 DIAGNOSIS — E7220 Disorder of urea cycle metabolism, unspecified: Secondary | ICD-10-CM

## 2014-05-17 DIAGNOSIS — I5031 Acute diastolic (congestive) heart failure: Secondary | ICD-10-CM

## 2014-05-17 LAB — COMPREHENSIVE METABOLIC PANEL
ALBUMIN: 3.3 g/dL — AB (ref 3.5–5.2)
ALK PHOS: 102 U/L (ref 39–117)
ALT: 26 U/L (ref 0–35)
AST: 32 U/L (ref 0–37)
Anion gap: 15 (ref 5–15)
BUN: 54 mg/dL — ABNORMAL HIGH (ref 6–23)
CO2: 22 mmol/L (ref 19–32)
Calcium: 9.7 mg/dL (ref 8.4–10.5)
Chloride: 94 mmol/L — ABNORMAL LOW (ref 96–112)
Creatinine, Ser: 1.67 mg/dL — ABNORMAL HIGH (ref 0.50–1.10)
GFR calc Af Amer: 37 mL/min — ABNORMAL LOW (ref >90)
GFR calc non Af Amer: 32 mL/min — ABNORMAL LOW (ref >90)
GLUCOSE: 261 mg/dL — AB (ref 70–99)
Potassium: 4.8 mmol/L (ref 3.5–5.1)
SODIUM: 131 mmol/L — AB (ref 135–145)
TOTAL PROTEIN: 7.2 g/dL (ref 6.0–8.3)
Total Bilirubin: 0.5 mg/dL (ref 0.3–1.2)

## 2014-05-17 LAB — GLUCOSE, CAPILLARY
GLUCOSE-CAPILLARY: 245 mg/dL — AB (ref 70–99)
GLUCOSE-CAPILLARY: 239 mg/dL — AB (ref 70–99)
Glucose-Capillary: 198 mg/dL — ABNORMAL HIGH (ref 70–99)
Glucose-Capillary: 220 mg/dL — ABNORMAL HIGH (ref 70–99)
Glucose-Capillary: 301 mg/dL — ABNORMAL HIGH (ref 70–99)
Glucose-Capillary: 355 mg/dL — ABNORMAL HIGH (ref 70–99)

## 2014-05-17 LAB — TYPE AND SCREEN
ABO/RH(D): A POS
Antibody Screen: NEGATIVE
UNIT DIVISION: 0

## 2014-05-17 LAB — CBC
HCT: 23.6 % — ABNORMAL LOW (ref 36.0–46.0)
Hemoglobin: 7.9 g/dL — ABNORMAL LOW (ref 12.0–15.0)
MCH: 28.5 pg (ref 26.0–34.0)
MCHC: 33.5 g/dL (ref 30.0–36.0)
MCV: 85.2 fL (ref 78.0–100.0)
PLATELETS: 448 10*3/uL — AB (ref 150–400)
RBC: 2.77 MIL/uL — ABNORMAL LOW (ref 3.87–5.11)
RDW: 16.1 % — ABNORMAL HIGH (ref 11.5–15.5)
WBC: 11.4 10*3/uL — ABNORMAL HIGH (ref 4.0–10.5)

## 2014-05-17 LAB — MAGNESIUM: Magnesium: 1.8 mg/dL (ref 1.5–2.5)

## 2014-05-17 LAB — AMMONIA: Ammonia: 104 umol/L — ABNORMAL HIGH (ref 11–32)

## 2014-05-17 MED ORDER — INSULIN GLARGINE 100 UNIT/ML ~~LOC~~ SOLN
10.0000 [IU] | Freq: Every day | SUBCUTANEOUS | Status: DC
  Administered 2014-05-17 – 2014-05-22 (×6): 10 [IU] via SUBCUTANEOUS
  Filled 2014-05-17 (×8): qty 0.1

## 2014-05-17 MED ORDER — INSULIN ASPART 100 UNIT/ML ~~LOC~~ SOLN
0.0000 [IU] | Freq: Three times a day (TID) | SUBCUTANEOUS | Status: DC
Start: 2014-05-17 — End: 2014-05-23
  Administered 2014-05-17: 3 [IU] via SUBCUTANEOUS
  Administered 2014-05-17: 2 [IU] via SUBCUTANEOUS
  Administered 2014-05-17: 3 [IU] via SUBCUTANEOUS
  Administered 2014-05-18 (×3): 2 [IU] via SUBCUTANEOUS
  Administered 2014-05-19: 3 [IU] via SUBCUTANEOUS
  Administered 2014-05-19: 2 [IU] via SUBCUTANEOUS
  Administered 2014-05-19 – 2014-05-20 (×3): 1 [IU] via SUBCUTANEOUS
  Administered 2014-05-20 – 2014-05-21 (×2): 2 [IU] via SUBCUTANEOUS
  Administered 2014-05-21 (×2): 1 [IU] via SUBCUTANEOUS
  Administered 2014-05-22 (×2): 2 [IU] via SUBCUTANEOUS
  Administered 2014-05-22: 1 [IU] via SUBCUTANEOUS
  Administered 2014-05-23: 2 [IU] via SUBCUTANEOUS
  Administered 2014-05-23: 1 [IU] via SUBCUTANEOUS

## 2014-05-17 MED ORDER — SPIRONOLACTONE 12.5 MG HALF TABLET
12.5000 mg | ORAL_TABLET | Freq: Two times a day (BID) | ORAL | Status: DC
  Administered 2014-05-17 – 2014-05-23 (×13): 12.5 mg via ORAL
  Filled 2014-05-17 (×17): qty 1

## 2014-05-17 MED ORDER — LACTULOSE 10 GM/15ML PO SOLN
10.0000 g | Freq: Two times a day (BID) | ORAL | Status: DC
  Administered 2014-05-17 – 2014-05-23 (×12): 10 g via ORAL
  Filled 2014-05-17 (×14): qty 15

## 2014-05-17 MED ORDER — RIFAXIMIN 550 MG PO TABS
550.0000 mg | ORAL_TABLET | Freq: Two times a day (BID) | ORAL | Status: DC
  Administered 2014-05-17 – 2014-05-23 (×13): 550 mg via ORAL
  Filled 2014-05-17 (×14): qty 1

## 2014-05-17 NOTE — Progress Notes (Signed)
Inpatient Diabetes Program Recommendations  AACE/ADA: New Consensus Statement on Inpatient Glycemic Control (2013)  Target Ranges:  Prepandial:   less than 140 mg/dL      Peak postprandial:   less than 180 mg/dL (1-2 hours)      Critically ill patients:  140 - 180 mg/dL    Diabetes history: DM 2 Outpatient Diabetes medications: U-500 33-33-30 with meals Current orders for Inpatient glycemic control: Novolog 0-9 TID, Now Lantus 10 units  Inpatient Diabetes Program Recommendations Correction (SSI): Patient is on concentrated U-500 TID with meals at home. If patient has home insulin please order U-500 30 units TID, If patient does not have U-500 insulin please increase correction to resistant scale Novolog 0-20 units TID and will possibly need meal coverage as well.   Thanks,  Tama Headings RN, MSN, [facility] Inpatient Diabetes Coordinator Team Pager [phone]

## 2014-05-17 NOTE — Progress Notes (Signed)
PROGRESS NOTE  [name] FAO:[id number] DOB: 1950-04-07 DOA: 05/15/2014 PCP: [name], MD  HPI/Recap of past 24 hours:  Feeling better, on nasal cannula, no cough, no chest pain. Ab pain much better on oral oxycodone.  Poor IV access, may need piccline , patient agree with picc if needed.  Assessment/Plan: Principal Problem:   Acute respiratory failure Active Problems:   Type 2 diabetes, uncontrolled, with neuropathy   Non-alcoholic cirrhosis   CAP (community acquired pneumonia)   SOB (shortness of breath)   Acute diastolic CHF (congestive heart failure)   Cirrhosis   Hypotension due to drugs   Congestive heart disease   Iron deficiency anemia due to chronic blood loss   Hyperammonemia   Hypervolemia   Acute on chronic diastolic CHF (congestive heart failure), NYHA class 4  1. Acute respiratory failure/SOB (shortness of breath)- Due to #3, and #2 improved off  BIPAP/nebs/abx/lasix, lung exam with good aeration, no wheezing this am, does still has few crackles at bases, continue  o2 supplement, currently 4liter( on 2liter at home). Keep in stepdown for one more day     2. Acute on chronic diastolic CHF (congestive heart failure) (h/o bradycardia, sinus node dysfunction s/p pacemaker 08/2013) Acute CHF Protocol currently on lasix '80mg'$  bid, spironolectone per cards recommendation Monitor Electrolytes, echo adequate LVEF, +diastolic dysfunction  3. CAP (community acquired pneumonia) IV Levaquin, sputum culture Albuterol Nebs O2  4. AKI, continue worsening of renal function. ua unremarkable, good urine output, close monitor, renal dosing meds.   5. Hyponatremia: likely from volume overload, treat chf.     6. Type 2  diabetes, uncontrolled, with neuropathy SSI coverage PRN HbA1C 9.6 in 03/2014                         Started basal insulin, continue adjust dose     7. Non-alcoholic cirrhosis/Cirrhosis/elevated ammonia level ammonia elevated,  rifaximin and lactulose restarted Monitor LFTs, mental status                         GI consulted    8. Acute of chronic blood loss anemia:  secondary to known chronic blood loss from colostomy , hemodilution for volume overload also contribute, due to sob,  prbc x1units along with lasix. hgb improved.    9. Right ovarian mass: was recently evaluated by [facility] in 03/2014, determined less likely malignancy inaddition to poor surgical candidate, recommend repeat ultrasound in 63month.       10. FTT on home oxygen, reported recent fall x1 due to weakness, will get PT eval once stablizd.   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, requiring blood transfusion  Code Status: full  Family Communication: patient  Disposition Plan: remain in stepdown   Consultants:  GI  cardiology  Procedures:  bipap briefly  Antibiotics:  levaquin   Objective: BP 141/46 mmHg  Pulse 67  Temp(Src) 97 F (36.1 C) (Oral)  Resp 16  Wt 108.6 kg (239 lb 6.7 oz)  SpO2 100%  Intake/Output Summary (Last 24 hours) at 05/17/14 1928 Last data filed at 05/17/14 1900  Gross per 24 hour  Intake    940 ml  Output   2450 ml  Net  -1510 ml   Filed Weights   05/16/14 0152 05/16/14 0819 05/17/14 0500  Weight: 104.554 kg (230 lb 8 oz) 107 kg (235 lb 14.3 oz) 108.6 kg (  239 lb 6.7 oz)    Exam:   General:  Obese, on nasal cannula, NAD  Cardiovascular: RRR  Respiratory: CTABL, few crackles at bases, no wheezing, no rhonchi  Abdomen:  Soft/ND/NT, positive BS, +colostomy, peristomal hernia, small ventral hernia, all nontender. No visible blood in colostomy bag during exam today  Musculoskeletal: less pitting edema bilateral lower extremity  Neuro: aaox3  Data Reviewed: Basic Metabolic Panel:  Recent Labs Lab 05/13/14 1132 05/15/14 2255 05/17/14 0337  NA 131* 129* 131*  K 5.2* 4.8 4.8  CL  --  96 94*  CO2 19* 22 22  GLUCOSE 133 82 261*  BUN 45.0* 43* 54*  CREATININE 1.4* 1.51* 1.67*  CALCIUM 8.4 9.9 9.7  MG  --   --  1.8   Liver Function Tests:  Recent Labs Lab 05/13/14 1132 05/15/14 2322 05/17/14 0337  AST 42* 40* 32  ALT 32 29 26  ALKPHOS 115 113 102  BILITOT 0.29 0.4 0.5  PROT 7.4 7.8 7.2  ALBUMIN 3.5 3.7 3.3*   No results for input(s): LIPASE, AMYLASE in the last 168 hours.  Recent Labs Lab 05/15/14 2237 05/17/14 0437  AMMONIA 89* 104*   CBC:  Recent Labs Lab 05/13/14 1132 05/15/14 2255 05/17/14 0337  WBC 14.8* 16.0* 11.4*  NEUTROABS 8.3*  --   --   HGB 7.2* 7.1* 7.9*  HCT 22.7* 22.1* 23.6*  MCV 84.7 83.7 85.2  PLT 433* 513* 448*   Cardiac Enzymes:   No results for input(s): CKTOTAL, CKMB, CKMBINDEX, TROPONINI in the last 168 hours. BNP (last 3 results)  Recent Labs  03/04/14 2100 04/12/14 0710 05/15/14 2322  BNP 35.8 311.9* 918.6*    ProBNP (last 3 results)  Recent Labs  07/16/13 0650 09/02/13 1803 03/30/14 1230  PROBNP 933.6* 558.0* 71.0    CBG:  Recent Labs Lab 05/17/14 0025 05/17/14 0434 05/17/14 0737 05/17/14 1317 05/17/14 1558  GLUCAP 301* 245* 239* 220* 198*    Recent Results (from the past 240 hour(s))  Culture, blood (routine x 2)     Status: None (Preliminary result)   Collection Time: 05/15/14 10:57 PM  Result Value Ref Range Status   Specimen Description BLOOD RIGHT HAND  Final   Special Requests BOTTLES DRAWN AEROBIC ONLY 2CC  Final   Culture   Final           BLOOD CULTURE RECEIVED NO GROWTH TO DATE CULTURE WILL BE HELD FOR 5 DAYS  BEFORE ISSUING A FINAL NEGATIVE REPORT Performed at [facility]    Report Status PENDING  Incomplete  Culture, blood (routine x 2)     Status: None (Preliminary result)   Collection Time: 05/16/14 12:20 AM  Result Value Ref Range Status   Specimen Description BLOOD RIGHT FOREARM  Final   Special Requests BOTTLES DRAWN AEROBIC AND ANAEROBIC B 3CC R 5CC  Final   Culture   Final           BLOOD CULTURE RECEIVED NO GROWTH TO DATE CULTURE WILL BE HELD FOR 5 DAYS BEFORE ISSUING A FINAL NEGATIVE REPORT Performed at [facility]    Report Status PENDING  Incomplete  MRSA PCR Screening     Status: None   Collection Time: 05/16/14  8:28 AM  Result Value Ref Range Status   MRSA by PCR NEGATIVE NEGATIVE Final    Comment:        The GeneXpert MRSA Assay (FDA approved for NASAL specimens only), is one component of a comprehensive  MRSA colonization surveillance program. It is not intended to diagnose MRSA infection nor to guide or monitor treatment for MRSA infections.      Studies: Ct Abdomen Pelvis Wo Contrast  05/16/2014   CLINICAL DATA:  Generalized weakness, shortness of breath  EXAM: CT ABDOMEN AND PELVIS WITHOUT CONTRAST  TECHNIQUE: Multidetector CT imaging of the abdomen and pelvis was performed following the standard protocol without IV contrast.  COMPARISON:  04/20/2014 radiograph 04/19/2014 CT  FINDINGS: Small right pleural effusion. Mild bibasilar airspace opacities, predominantly linear and lobular subpleural on the left partially imaged cardiac leads within the right atrium and ventricle.  Organ evaluation is limited in the absence of intravenous contrast. Within this limitation, cirrhotic liver morphology. Cholecystectomy. No intrahepatic biliary ductal dilatation mild extrahepatic biliary ductal prominence is nonspecific.  Absent spleen. Nodular soft tissue density left upper quadrant presumably reflects residual splenules or regenerative splenic tissue. No  appreciable abnormality of the pancreas. Bilateral adrenal gland nodularity without a dominant/measurable nodule. Symmetric renal size. Mild bilateral perinephric fat stranding. No hydroureteronephrosis.  Hartmann pouch. Left lower quadrant colostomy. The small bowel loops are of normal caliber. Anterior abdominal wall adhesions. Parastomal hernia containing loops of small bowel without evidence for obstruction or incarceration.  Hysterectomy. 8.8 x 4.3 cm lobulated mass or complex fluid within the operative bed is similar to slightly increased from the prior. Thin walled bladder.  Advanced atherosclerotic disease of the abdominal aorta and branch vessels.  Diffuse anasarca.  Mild multilevel degenerative changes.  IMPRESSION: Left lower quadrant colostomy. Small bowel containing parastomal hernia. No bowel obstruction.  8.8 x 4.3 cm fluid collection or mass within the hysterectomy bed, similar to slightly increased since the prior. Per prior report, this is previously thought to be of ovarian origin.  Cirrhosis. Mild extrahepatic biliary ductal dilatation is nonspecific post cholecystectomy. Correlate with LFTs and ERCP if warranted.  Small right pleural effusion. Mild bibasilar airspace opacities; atelectasis and/or pneumonia. Small right pleural effusion.   Electronically Signed   By: Carlos Levering M.D.   On: 05/16/2014 05:39    Scheduled Meds: . furosemide  80 mg Intravenous Q12H  . insulin aspart  0-9 Units Subcutaneous TID WC  . insulin glargine  10 Units Subcutaneous QHS  . ipratropium-albuterol  3 mL Nebulization Q4H  . lactulose  10 g Oral BID  . [START ON 05/18/2014] levofloxacin (LEVAQUIN) IV  750 mg Intravenous Q48H  . rifaximin  550 mg Oral BID  . sodium chloride  3 mL Intravenous Q12H  . spironolactone  12.5 mg Oral BID    Continuous Infusions:    Time spent: >73mns   Sayyid Harewood MD, PhD  [facility] Pager [phone] If 7PM-7AM, please contact night-coverage at [email or web address],  password [facility]4/19/2016, 7:28 PM  LOS: 1 day

## 2014-05-17 NOTE — Progress Notes (Signed)
Subjective:  Breathing better; mild shortness of breath with limited activity  Objective:   Vital Signs : Filed Vitals:   05/17/14 0331 05/17/14 0400 05/17/14 0500 05/17/14 0800  BP:    128/57  Pulse:   60 60  Temp:  97.5 F (36.4 C)  98.2 F (36.8 C)  TempSrc:  Oral    Resp:   10 10  Weight:   239 lb 6.7 oz (108.6 kg)   SpO2: 97%  98% 98%    Intake/Output from previous day:  Intake/Output Summary (Last 24 hours) at 05/17/14 0913 Last data filed at 05/17/14 0500  Gross per 24 hour  Intake 693.33 ml  Output   1600 ml  Net -906.67 ml    I/O since admission: -1506.7  Wt Readings from Last 3 Encounters:  05/17/14 239 lb 6.7 oz (108.6 kg)  05/11/14 226 lb 6 oz (102.683 kg)  04/28/14 218 lb (98.884 kg)    Medications: . furosemide  80 mg Intravenous Q12H  . insulin aspart  0-9 Units Subcutaneous TID WC  . insulin glargine  10 Units Subcutaneous QHS  . ipratropium-albuterol  3 mL Nebulization Q4H  . lactulose  10 g Oral BID  . [START ON 05/18/2014] levofloxacin (LEVAQUIN) IV  750 mg Intravenous Q48H  . rifaximin  550 mg Oral BID  . sodium chloride  3 mL Intravenous Q12H  . spironolactone  50 mg Oral Daily      Physical Exam:   General appearance: alert, cooperative and no distress Neck: no adenopathy, no carotid bruit and supple, symmetrical, trachea midline Lungs: no rales or wheezes Heart: regular rate and rhythm and 1/6 sem; no s3, no rub Abdomen: central adiposity; nontender; BS +; LLQ colostomy Extremities: Trace - 1+ ankle edema Pulses: 2+ and symmetric Skin: Skin color, texture, turgor normal. No rashes or lesions Neurologic: Grossly normal   Rate: 64  Rhythm: normal sinus rhythm  ECG (independently read by me): NSR at 64 without significant STT changes   Lab Results:  BMP Latest Ref Rng 05/17/2014 05/15/2014 05/13/2014  Glucose 70 - 99 mg/dL 261(H) 82 133  BUN 6 - 23 mg/dL 54(H) 43(H) 45.0(H)  Creatinine 0.50 - 1.10 mg/dL 1.67(H) 1.51(H) 1.4(H)   Sodium 135 - 145 mmol/L 131(L) 129(L) 131(L)  Potassium 3.5 - 5.1 mmol/L 4.8 4.8 5.2(H)  Chloride 96 - 112 mmol/L 94(L) 96 -  CO2 19 - 32 mmol/L 22 22 19(L)  Calcium 8.4 - 10.5 mg/dL 9.7 9.9 8.4     CBC Latest Ref Rng 05/17/2014 05/15/2014 05/13/2014  WBC 4.0 - 10.5 K/uL 11.4(H) 16.0(H) 14.8(H)  Hemoglobin 12.0 - 15.0 g/dL 7.9(L) 7.1(L) 7.2(L)  Hematocrit 36.0 - 46.0 % 23.6(L) 22.1(L) 22.7(L)  Platelets 150 - 400 K/uL 448(H) 513(H) 433(H)     No results for input(s): TROPONINI in the last 72 hours.  Invalid input(s): CK, MB  Hepatic Function Panel  Recent Labs  05/15/14 2322 05/17/14 0337  PROT 7.8 7.2  ALBUMIN 3.7 3.3*  AST 40* 32  ALT 29 26  ALKPHOS 113 102  BILITOT 0.4 0.5  BILIDIR <0.1  --   IBILI NOT CALCULATED  --    No results for input(s): INR in the last 72 hours. BNP (last 3 results)  Recent Labs  03/04/14 2100 04/12/14 0710 05/15/14 2322  BNP 35.8 311.9* 918.6*    ProBNP (last 3 results)  Recent Labs  07/16/13 0650 09/02/13 1803 03/30/14 1230  PROBNP 933.6* 558.0* 71.0     Lipid  Panel     Component Value Date/Time   CHOL 173 10/05/2013 0939   TRIG 86.0 10/05/2013 0939   HDL 45.10 10/05/2013 0939   CHOLHDL 4 10/05/2013 0939   VLDL 17.2 10/05/2013 0939   LDLCALC 111* 10/05/2013 0939      Imaging:  Ct Abdomen Pelvis Wo Contrast  05/16/2014   CLINICAL DATA:  Generalized weakness, shortness of breath  EXAM: CT ABDOMEN AND PELVIS WITHOUT CONTRAST  TECHNIQUE: Multidetector CT imaging of the abdomen and pelvis was performed following the standard protocol without IV contrast.  COMPARISON:  04/20/2014 radiograph 04/19/2014 CT  FINDINGS: Small right pleural effusion. Mild bibasilar airspace opacities, predominantly linear and lobular subpleural on the left partially imaged cardiac leads within the right atrium and ventricle.  Organ evaluation is limited in the absence of intravenous contrast. Within this limitation, cirrhotic liver morphology.  Cholecystectomy. No intrahepatic biliary ductal dilatation mild extrahepatic biliary ductal prominence is nonspecific.  Absent spleen. Nodular soft tissue density left upper quadrant presumably reflects residual splenules or regenerative splenic tissue. No appreciable abnormality of the pancreas. Bilateral adrenal gland nodularity without a dominant/measurable nodule. Symmetric renal size. Mild bilateral perinephric fat stranding. No hydroureteronephrosis.  Hartmann pouch. Left lower quadrant colostomy. The small bowel loops are of normal caliber. Anterior abdominal wall adhesions. Parastomal hernia containing loops of small bowel without evidence for obstruction or incarceration.  Hysterectomy. 8.8 x 4.3 cm lobulated mass or complex fluid within the operative bed is similar to slightly increased from the prior. Thin walled bladder.  Advanced atherosclerotic disease of the abdominal aorta and branch vessels.  Diffuse anasarca.  Mild multilevel degenerative changes.  IMPRESSION: Left lower quadrant colostomy. Small bowel containing parastomal hernia. No bowel obstruction.  8.8 x 4.3 cm fluid collection or mass within the hysterectomy bed, similar to slightly increased since the prior. Per prior report, this is previously thought to be of ovarian origin.  Cirrhosis. Mild extrahepatic biliary ductal dilatation is nonspecific post cholecystectomy. Correlate with LFTs and ERCP if warranted.  Small right pleural effusion. Mild bibasilar airspace opacities; atelectasis and/or pneumonia. Small right pleural effusion.   Electronically Signed   By: Carlos Levering M.D.   On: 05/16/2014 05:39   Dg Chest Portable 1 View  05/15/2014   CLINICAL DATA:  Shortness of breath  EXAM: PORTABLE CHEST - 1 VIEW  COMPARISON:  04/20/2014, 04/21/2013  FINDINGS: Hypoaeration with interstitial and vascular crowding. Central vascular congestion. Aortic atherosclerosis. Enlarged cardiac silhouette. Mild retrocardiac opacity is similar to  prior and increased from 2015. Small left pleural effusion not excluded. Underpenetration and osteopenia limits osseous assessment. No overt interval change. Left chest wall battery pack with unchanged lead tip positions.  IMPRESSION: Central vascular congestion and interstitial prominence may reflect chronic change or atypical/viral infection versus pulmonary edema if acute.  Mild left lower lobe airspace opacity is nonspecific; atelectasis, scarring, or pneumonia. Recommend attention on follow-up with PA and lateral views in 3-4 weeks.   Electronically Signed   By: Carlos Levering M.D.   On: 05/15/2014 23:32    TEE 01/06/2014: Study Conclusions  - Left ventricle: Systolic function was normal. The estimated ejection fraction was in the range of 55% to 60%. Wall motion was normal; there were no regional wall motion abnormalities. - Aortic valve: No evidence of vegetation. - Mitral valve: No evidence of vegetation. - Left atrium: The atrium was mildly dilated. No evidence of thrombus in the atrial cavity or appendage. - Atrial septum: There was a patent foramen ovale. -  Tricuspid valve: No evidence of vegetation. - Pulmonic valve: No evidence of vegetation.  Impressions:  - Normal LV function; mild LAE; trace MR and TR; patent foramen ovale noted. There was no evidence of a vegetation on valves or pacemaker leads.  Assessment/Plan:   Principal Problem:   Acute respiratory failure Active Problems:   Type 2 diabetes, uncontrolled, with neuropathy   Non-alcoholic cirrhosis   CAP (community acquired pneumonia)   SOB (shortness of breath)   Acute diastolic CHF (congestive heart failure)   Cirrhosis   Hypotension due to drugs   Congestive heart disease   Iron deficiency anemia due to chronic blood loss   Hyperammonemia   Hypervolemia   Acute on chronic diastolic CHF (congestive heart failure), NYHA class 4   1. Acute on chronic diastolic heart failure 2. Acute on  chronic respiratory failure, now clinically improving 3. Acute kidney injury on background of CK D stage III 4. Uncontrolled diabetes with complications 5. 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 Chronic abdominal pain 7. Chronic gastrointestinal blood loss  BNP elevated c/w acute on chronic diastolic heart failure. Diuresing well with iv furosemide with -1506. Cr increased to 1.67. Would change spironolactone to 12.5 mg bid rather than 50 mg daily today and then possibly titrate to 25 mg bid. No longer on metolazone. Will need to f/u renal fxn and serum K closely. Hyponatremia contributed by CHF. Will follow.  Troy Sine, MD, [facility] 05/17/2014, 9:13 AM

## 2014-05-17 NOTE — Progress Notes (Signed)
  Echocardiogram 2D Echocardiogram has been performed.  [name] 05/17/2014, 12:13 PM

## 2014-05-17 NOTE — Progress Notes (Signed)
Chart reviewed Acute issues now more related to acute heart failure and possible PNA rather than acutely decompensated cirrhosis. Will need GI follow-up with Dr. [name] after discharge GI consult service available with questions or concerns.  Please call with questions.

## 2014-05-18 ENCOUNTER — Ambulatory Visit (HOSPITAL_COMMUNITY): Admission: RE | Admit: 2014-05-18 | Payer: Medicare Other | Source: Ambulatory Visit

## 2014-05-18 DIAGNOSIS — D500 Iron deficiency anemia secondary to blood loss (chronic): Secondary | ICD-10-CM

## 2014-05-18 DIAGNOSIS — J9601 Acute respiratory failure with hypoxia: Secondary | ICD-10-CM

## 2014-05-18 DIAGNOSIS — J9600 Acute respiratory failure, unspecified whether with hypoxia or hypercapnia: Secondary | ICD-10-CM

## 2014-05-18 DIAGNOSIS — K7469 Other cirrhosis of liver: Secondary | ICD-10-CM

## 2014-05-18 LAB — BASIC METABOLIC PANEL
Anion gap: 11 (ref 5–15)
BUN: 62 mg/dL — AB (ref 6–23)
CO2: 28 mmol/L (ref 19–32)
Calcium: 9.8 mg/dL (ref 8.4–10.5)
Chloride: 89 mmol/L — ABNORMAL LOW (ref 96–112)
Creatinine, Ser: 1.57 mg/dL — ABNORMAL HIGH (ref 0.50–1.10)
GFR calc Af Amer: 40 mL/min — ABNORMAL LOW (ref >90)
GFR, EST NON AFRICAN AMERICAN: 34 mL/min — AB (ref >90)
Glucose, Bld: 161 mg/dL — ABNORMAL HIGH (ref 70–99)
Potassium: 4.3 mmol/L (ref 3.5–5.1)
Sodium: 128 mmol/L — ABNORMAL LOW (ref 135–145)

## 2014-05-18 LAB — GLUCOSE, CAPILLARY
GLUCOSE-CAPILLARY: 266 mg/dL — AB (ref 70–99)
GLUCOSE-CAPILLARY: 183 mg/dL — AB (ref 70–99)
Glucose-Capillary: 156 mg/dL — ABNORMAL HIGH (ref 70–99)
Glucose-Capillary: 161 mg/dL — ABNORMAL HIGH (ref 70–99)
Glucose-Capillary: 214 mg/dL — ABNORMAL HIGH (ref 70–99)

## 2014-05-18 LAB — HEMOGLOBIN A1C
HEMOGLOBIN A1C: 7.6 % — AB (ref 4.8–5.6)
MEAN PLASMA GLUCOSE: 171 mg/dL

## 2014-05-18 LAB — AMMONIA: AMMONIA: 38 umol/L — AB (ref 11–32)

## 2014-05-18 MED ORDER — IPRATROPIUM-ALBUTEROL 0.5-2.5 (3) MG/3ML IN SOLN: 3.0000 mL | RESPIRATORY_TRACT | Status: DC | PRN

## 2014-05-18 MED ORDER — IPRATROPIUM-ALBUTEROL 0.5-2.5 (3) MG/3ML IN SOLN
3.0000 mL | Freq: Three times a day (TID) | RESPIRATORY_TRACT | Status: DC
  Administered 2014-05-18 – 2014-05-19 (×5): 3 mL via RESPIRATORY_TRACT
  Filled 2014-05-18 (×5): qty 3

## 2014-05-18 NOTE — Clinical Social Work Placement (Signed)
   CLINICAL SOCIAL WORK PLACEMENT  NOTE  Date:  05/18/2014  Patient Details  Name: KERIGAN NARVAEZ MRN: [id number] Date of Birth: 1950-07-16  Clinical Social Work is seeking post-discharge placement for this patient at the [place] level of care (*CSW will initial, date and re-position this form in  chart as items are completed):  Yes   Patient/family provided with [facility] Department's list of facilities offering this level of care within the geographic area requested by the patient (or if unable, by the patient's family).  Yes   Patient/family informed of their freedom to choose among providers that offer the needed level of care, that participate in Medicare, Medicaid or managed care program needed by the patient, have an available bed and are willing to accept the patient.  Yes   Patient/family informed of [facility]'s ownership interest in [facility] and [facility], as well as of the fact that they are under no obligation to receive care at these facilities.  PASRR submitted to EDS on 05/18/14     PASRR number received on 05/18/14     Existing PASRR number confirmed on 05/18/14     FL2 transmitted to all facilities in geographic area requested by pt/family on 05/18/14     FL2 transmitted to all facilities within larger geographic area on       Patient informed that his/her managed care company has contracts with or will negotiate with certain facilities, including the following:            Patient/family informed of bed offers received.  Patient chooses bed at       Physician recommends and patient chooses bed at      Patient to be transferred to   on  .  Patient to be transferred to facility by       Patient family notified on   of transfer.  Name of family member notified:        PHYSICIAN       Additional Comment:    _______________________________________________ Lilly Cove, LCSW 05/18/2014, 1:36  PM

## 2014-05-18 NOTE — Progress Notes (Addendum)
PROGRESS NOTE  [name] BMW:[id number] DOB: 1950-05-26 DOA: 05/15/2014 PCP: [name], MD  HPI/Recap of past 24 hours: -breathing starting to improve  Assessment/Plan:  1. Acute respiratory failure/SOB (shortness of breath)- Due to #3, and #2 and underlying COPD -improving, off  BIPAP                        -nebs/abx/lasix                        -wean O2   2. Acute on chronic diastolic CHF (congestive heart failure) (h/o bradycardia, sinus node dysfunction s/p pacemaker) -continue IV lasix, negative >4L, also on aldactone                        -Cards follwing                        -ECHO  adequate LVEF, +diastolic dysfunction  3. CAP (community acquired pneumonia) IV Levaquin, sputum culture -nebs, wean O2  4. AKI,         -due to cardiorenal syndrome        -improving, bmet daily  5. COPD/chronci resp failure       -on 2L home O2   6. Hyponatremia: likely from volume overload, treat chf.      -worsened, treat CHF   6. Type 2 diabetes, uncontrolled, with neuropathy -SSI, HbA1C 9.6 in 03/2014                        - cotninue lantus   7. Non-alcoholic cirrhosis/Cirrhosis/elevated ammonia level -ammonia elevated,  rifaximin and lactulose restarted   -mental status normal                        - GI consult appreciated    8. 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   9. Right ovarian mass: was recently evaluated by gyne onc in 03/2014, determined less likely malignancy inaddition to poor surgical candidate, recommend repeat  ultrasound in 57month.      10. FTT on home oxygen, reported recent fall x1 due to weakness, will get PT eval once stablizd.   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, requiring blood transfusion  Code Status: full  Family Communication: patient  Disposition Plan: Tx to tele   Consultants:  GI  cardiology  Procedures:  bipap briefly  Antibiotics:  levaquin   Objective: BP 126/39 mmHg  Pulse 67  Temp(Src) 98.1 F (36.7 C) (Oral)  Resp 15  Wt 98.3 kg (216 lb 11.4 oz)  SpO2 100%  Intake/Output Summary (Last 24 hours) at 05/18/14 0734 Last data filed at 05/18/14 0230  Gross per 24 hour  Intake    940 ml  Output   3650 ml  Net  -2710 ml   Filed Weights   05/16/14 0819 05/17/14 0500 05/18/14 0500  Weight: 107 kg (235 lb 14.3 oz) 108.6 kg (239 lb 6.7 oz) 98.3 kg (  216 lb 11.4 oz)    Exam:   General:  Obese, on nasal cannula, AAOx3  Cardiovascular: RRR  Respiratory: CTABL, few crackles at bases, no wheezing, no rhonchi  Abdomen: Soft/ND/NT, positive BS, +colostomy, peristomal hernia, small ventral hernia, all nontender. No visible blood in colostomy bag during exam today  Musculoskeletal: 1 plus edema  Neuro: aaox3  Data Reviewed: Basic Metabolic Panel:  Recent Labs Lab 05/13/14 1132 05/15/14 2255 05/17/14 0337 05/18/14 0250  NA 131* 129* 131* 128*  K 5.2* 4.8 4.8 4.3  CL  --  96 94* 89*  CO2 19* '22 22 28  '$ GLUCOSE 133 82 261* 161*  BUN 45.0* 43* 54* 62*  CREATININE 1.4* 1.51* 1.67* 1.57*  CALCIUM 8.4 9.9 9.7 9.8  MG  --   --  1.8  --    Liver Function Tests:  Recent Labs Lab 05/13/14 1132 05/15/14 2322 05/17/14 0337  AST 42* 40* 32  ALT 32 29 26  ALKPHOS 115 113 102  BILITOT 0.29 0.4 0.5  PROT 7.4 7.8 7.2  ALBUMIN 3.5 3.7 3.3*   No results for input(s): LIPASE, AMYLASE in the last 168 hours.  Recent Labs Lab  05/15/14 2237 05/17/14 0437 05/18/14 0253  AMMONIA 89* 104* 38*   CBC:  Recent Labs Lab 05/13/14 1132 05/15/14 2255 05/17/14 0337  WBC 14.8* 16.0* 11.4*  NEUTROABS 8.3*  --   --   HGB 7.2* 7.1* 7.9*  HCT 22.7* 22.1* 23.6*  MCV 84.7 83.7 85.2  PLT 433* 513* 448*   Cardiac Enzymes:   No results for input(s): CKTOTAL, CKMB, CKMBINDEX, TROPONINI in the last 168 hours. BNP (last 3 results)  Recent Labs  03/04/14 2100 04/12/14 0710 05/15/14 2322  BNP 35.8 311.9* 918.6*    ProBNP (last 3 results)  Recent Labs  07/16/13 0650 09/02/13 1803 03/30/14 1230  PROBNP 933.6* 558.0* 71.0    CBG:  Recent Labs Lab 05/17/14 0434 05/17/14 0737 05/17/14 1317 05/17/14 1558 05/17/14 2203  GLUCAP 245* 239* 220* 198* 266*    Recent Results (from the past 240 hour(s))  Culture, blood (routine x 2)     Status: None (Preliminary result)   Collection Time: 05/15/14 10:57 PM  Result Value Ref Range Status   Specimen Description BLOOD RIGHT HAND  Final   Special Requests BOTTLES DRAWN AEROBIC ONLY 2CC  Final   Culture   Final           BLOOD CULTURE RECEIVED NO GROWTH TO DATE CULTURE WILL BE HELD FOR 5 DAYS BEFORE ISSUING A FINAL NEGATIVE REPORT Performed at [facility]    Report Status PENDING  Incomplete  Culture, blood (routine x 2)     Status: None (Preliminary result)   Collection Time: 05/16/14 12:20 AM  Result Value Ref Range Status   Specimen Description BLOOD RIGHT FOREARM  Final   Special Requests BOTTLES DRAWN AEROBIC AND ANAEROBIC B 3CC R 5CC  Final   Culture   Final           BLOOD CULTURE RECEIVED NO GROWTH TO DATE CULTURE WILL BE HELD FOR 5 DAYS BEFORE ISSUING A FINAL NEGATIVE REPORT Performed at [facility]    Report Status PENDING  Incomplete  MRSA PCR Screening     Status: None   Collection Time: 05/16/14  8:28 AM  Result Value Ref Range Status   MRSA by PCR NEGATIVE NEGATIVE Final    Comment:        The GeneXpert MRSA Assay  (  FDA approved for NASAL specimens only), is one component of a comprehensive MRSA colonization surveillance program. It is not intended to diagnose MRSA infection nor to guide or monitor treatment for MRSA infections.      Studies: No results found.  Scheduled Meds: . insulin aspart  0-9 Units Subcutaneous TID WC  . insulin glargine  10 Units Subcutaneous QHS  . ipratropium-albuterol  3 mL Nebulization Q4H  . lactulose  10 g Oral BID  . levofloxacin (LEVAQUIN) IV  750 mg Intravenous Q48H  . rifaximin  550 mg Oral BID  . sodium chloride  3 mL Intravenous Q12H  . spironolactone  12.5 mg Oral BID    Continuous Infusions:    Time spent: >57mns   Olivia Royse MD  [facility] Pager [phone] If 7PM-7AM, please contact night-coverage at [email or web address], password [facility]4/20/2016, 7:34 AM  LOS: 2 days

## 2014-05-18 NOTE — Clinical Social Work Placement (Signed)
   CLINICAL SOCIAL WORK PLACEMENT  NOTE  Date:  05/18/2014  Patient Details  Name: KONNER WARRIOR MRN: [id number] Date of Birth: 09-Apr-1950  Clinical Social Work is seeking post-discharge placement for this patient at the [place] level of care (*CSW will initial, date and re-position this form in  chart as items are completed):  Yes   Patient/family provided with [place] Work Department's list of facilities offering this level of care within the geographic area requested by the patient (or if unable, by the patient's family).  Yes   Patient/family informed of their freedom to choose among providers that offer the needed level of care, that participate in Medicare, Medicaid or managed care program needed by the patient, have an available bed and are willing to accept the patient.  Yes   Patient/family informed of [place]'s ownership interest in [facility] and [facility], as well as of the fact that they are under no obligation to receive care at these facilities.  PASRR submitted to EDS on 05/18/14     PASRR number received on 05/18/14     Existing PASRR number confirmed on 05/18/14     FL2 transmitted to all facilities in geographic area requested by pt/family on 05/18/14     FL2 transmitted to all facilities within larger geographic area on       Patient informed that his/her managed care company has contracts with or will negotiate with certain facilities, including the following:            Patient/family informed of bed offers received.  Patient chooses bed at       Physician recommends and patient chooses bed at      Patient to be transferred to   on  .  Patient to be transferred to facility by       Patient family notified on   of transfer.  Name of family member notified:        PHYSICIAN       Additional Comment:    _______________________________________________ Lilly Cove, LCSW 05/18/2014, 1:36  PM

## 2014-05-18 NOTE — Clinical Social Work Placement (Signed)
   CLINICAL SOCIAL WORK PLACEMENT  NOTE  Date:  05/18/2014  Patient Details  Name: [name] MRN: [id number] Date of Birth: [birth year]  Clinical Social Work is seeking post-discharge placement for this patient at the [place] level of care (*CSW will initial, date and re-position this form in  chart as items are completed):  Yes   Patient/family provided with [place] Work Department's list of facilities offering this level of care within the geographic area requested by the patient (or if unable, by the patient's family).  Yes   Patient/family informed of their freedom to choose among providers that offer the needed level of care, that participate in Medicare, Medicaid or managed care program needed by the patient, have an available bed and are willing to accept the patient.  Yes   Patient/family informed of [place]'s ownership interest in [facility] and [facility], as well as of the fact that they are under no obligation to receive care at these facilities.  PASRR submitted to EDS on 05/18/14     PASRR number received on 05/18/14     Existing PASRR number confirmed on 05/18/14     FL2 transmitted to all facilities in geographic area requested by pt/family on 05/18/14     FL2 transmitted to all facilities within larger geographic area on       Patient informed that his/her managed care company has contracts with or will negotiate with certain facilities, including the following:            Patient/family informed of bed offers received.  Patient chooses bed at       Physician recommends and patient chooses bed at      Patient to be transferred to   on  .  Patient to be transferred to facility by       Patient family notified on   of transfer.  Name of family member notified:        PHYSICIAN       Additional Comment:    _______________________________________________ Lilly Cove, LCSW 05/18/2014, 1:40  PM

## 2014-05-18 NOTE — Clinical Social Work Placement (Signed)
   CLINICAL SOCIAL WORK PLACEMENT  NOTE  Date:  05/18/2014  Patient Details  Name: [name] MRN: [id number] Date of Birth: 1951/01/01  Clinical Social Work is seeking post-discharge placement for this patient at the [place] level of care (*CSW will initial, date and re-position this form in  chart as items are completed):  Yes   Patient/family provided with [place] Work Department's list of facilities offering this level of care within the geographic area requested by the patient (or if unable, by the patient's family).  Yes   Patient/family informed of their freedom to choose among providers that offer the needed level of care, that participate in Medicare, Medicaid or managed care program needed by the patient, have an available bed and are willing to accept the patient.  Yes   Patient/family informed of [place]'s ownership interest in [facility] and [facility], as well as of the fact that they are under no obligation to receive care at these facilities.  PASRR submitted to EDS on 05/18/14     PASRR number received on 05/18/14     Existing PASRR number confirmed on 05/18/14     FL2 transmitted to all facilities in geographic area requested by pt/family on 05/18/14     FL2 transmitted to all facilities within larger geographic area on       Patient informed that his/her managed care company has contracts with or will negotiate with certain facilities, including the following:            Patient/family informed of bed offers received.  Patient chooses bed at       Physician recommends and patient chooses bed at      Patient to be transferred to   on  .  Patient to be transferred to facility by       Patient family notified on   of transfer.  Name of family member notified:        PHYSICIAN       Additional Comment:    _______________________________________________ Lilly Cove, LCSW 05/18/2014, 1:42  PM

## 2014-05-18 NOTE — Clinical Social Work Placement (Signed)
  CLINICAL SOCIAL WORK PLACEMENT  NOTE  Date:  05/18/2014  Patient Details  Name: LAURIELLE SELMON MRN: [id number] Date of Birth: 1950/10/17  Clinical Social Work is seeking post-discharge placement for this patient at the Skilled Level (SNF) level of care (*CSW will initial, date and re-position this form in  chart as items are completed):    05/18/2014   Patient/family provided with [place] Work Department's list of facilities offering this level of care within the geographic area requested by the patient (or if unable, by the patient's family).    05/18/2014   Patient/family informed of their freedom to choose among providers that offer the needed level of care, that participate in Medicare, Medicaid or managed care program needed by the patient, have an available bed and are willing to accept the patient.     05/18/2014 Patient/family informed of [place]'s ownership interest in [facility] and [facility], as well as of the fact that they are under no obligation to receive care at these facilities.  PASRR submitted to EDS on     05/18/2014  PASRR number received on     05/18/2014  Existing PASRR number confirmed on   05/18/2014    FL2 transmitted to all facilities in geographic area requested by pt/family on     05/18/2014  FL2 transmitted to all facilities within larger geographic area on       Patient informed that his/her managed care company has contracts with or will negotiate with certain facilities, including the following:            Patient/family informed of bed offers received.  Patient chooses bed at       Physician recommends and patient chooses bed at      Patient to be transferred to   on  .  Patient to be transferred to facility by       Patient family notified on   of transfer.  Name of family member notified:        PHYSICIAN       Additional Comment:  Patient faxed out to [facility]  _______________________________________________ Lilly Cove, LCSW 05/18/2014, 1:29 PM

## 2014-05-18 NOTE — Clinical Documentation Improvement (Signed)
MD's, NP's, and PA's  Noted patient's GFR 34/ 36 and creatinine 1.86 also documentation of "Cardiorenal syndrome",  please provide stage of "CKD" if appropriate for this admission. Thank you    Possible Clinical Conditions?   CKD Stage I - GFR > OR = 90 CKD Stage II - GFR 60-80 CKD Stage III - GFR 30-59 CKD Stage IV - GFR 15-29 CKD Stage V - GFR < 15 Other condition Cannot Clinically determine    Risk Factors: Acute Diastolic HF, PNA, Acute Kidney injury, DM II uncontrolled w neuropathy  Diagnostics: CBC x3   Treatment: D5 NS @ 50 ml/ NS IV 10 ml  Thank You, Ree Kida ,RN Clinical Documentation Specialist:  [phone]  [place] Information Management

## 2014-05-18 NOTE — Progress Notes (Signed)
Subjective:  Breathing better; less shortness of breath  Objective:   Vital Signs : Filed Vitals:   05/18/14 0408 05/18/14 0500 05/18/14 0748 05/18/14 0800  BP: 126/39   128/52  Pulse: 67   68  Temp: 98.1 F (36.7 C)   98.2 F (36.8 C)  TempSrc: Oral   Oral  Resp: 15   10  Weight:  216 lb 11.4 oz (98.3 kg)    SpO2: 100%  98% 99%    Intake/Output from previous day:  Intake/Output Summary (Last 24 hours) at 05/18/14 0936 Last data filed at 05/18/14 0230  Gross per 24 hour  Intake    460 ml  Output   3250 ml  Net  -2790 ml    I/O since admission: -4216  Wt Readings from Last 3 Encounters:  05/18/14 216 lb 11.4 oz (98.3 kg)  05/11/14 226 lb 6 oz (102.683 kg)  04/28/14 218 lb (98.884 kg)    Medications: . insulin aspart  0-9 Units Subcutaneous TID WC  . insulin glargine  10 Units Subcutaneous QHS  . ipratropium-albuterol  3 mL Nebulization TID  . lactulose  10 g Oral BID  . levofloxacin (LEVAQUIN) IV  750 mg Intravenous Q48H  . rifaximin  550 mg Oral BID  . sodium chloride  3 mL Intravenous Q12H  . spironolactone  12.5 mg Oral BID      Physical Exam:   General appearance: alert, cooperative and no distress Neck: no adenopathy, no carotid bruit and supple, symmetrical, trachea midline Lungs: no rales or wheezes Heart: regular rate and rhythm and 1/6 sem; no s3, no rub Abdomen: central adiposity; nontender; BS +; LLQ colostomy Extremities: Trace ankle edema, improving daily Pulses: 2+ and symmetric Skin: Skin color, texture, turgor normal. No rashes or lesions Neurologic: Grossly normal   Rate: 67  Rhythm: normal sinus rhythm with PAC's and PVC's  ECG (independently read by me): NSR at 64 without significant STT changes   Lab Results:  BMP Latest Ref Rng 05/18/2014 05/17/2014 05/15/2014  Glucose 70 - 99 mg/dL 161(H) 261(H) 82  BUN 6 - 23 mg/dL 62(H) 54(H) 43(H)  Creatinine 0.50 - 1.10 mg/dL 1.57(H) 1.67(H) 1.51(H)  Sodium 135 - 145 mmol/L 128(L)  131(L) 129(L)  Potassium 3.5 - 5.1 mmol/L 4.3 4.8 4.8  Chloride 96 - 112 mmol/L 89(L) 94(L) 96  CO2 19 - 32 mmol/L '28 22 22  '$ Calcium 8.4 - 10.5 mg/dL 9.8 9.7 9.9     CBC Latest Ref Rng 05/17/2014 05/15/2014 05/13/2014  WBC 4.0 - 10.5 K/uL 11.4(H) 16.0(H) 14.8(H)  Hemoglobin 12.0 - 15.0 g/dL 7.9(L) 7.1(L) 7.2(L)  Hematocrit 36.0 - 46.0 % 23.6(L) 22.1(L) 22.7(L)  Platelets 150 - 400 K/uL 448(H) 513(H) 433(H)     No results for input(s): TROPONINI in the last 72 hours.  Invalid input(s): CK, MB  Hepatic Function Panel  Recent Labs  05/15/14 2322 05/17/14 0337  PROT 7.8 7.2  ALBUMIN 3.7 3.3*  AST 40* 32  ALT 29 26  ALKPHOS 113 102  BILITOT 0.4 0.5  BILIDIR <0.1  --   IBILI NOT CALCULATED  --    No results for input(s): INR in the last 72 hours. BNP (last 3 results)  Recent Labs  03/04/14 2100 04/12/14 0710 05/15/14 2322  BNP 35.8 311.9* 918.6*    ProBNP (last 3 results)  Recent Labs  07/16/13 0650 09/02/13 1803 03/30/14 1230  PROBNP 933.6* 558.0* 71.0     Lipid Panel     Component  Value Date/Time   CHOL 173 10/05/2013 0939   TRIG 86.0 10/05/2013 0939   HDL 45.10 10/05/2013 0939   CHOLHDL 4 10/05/2013 0939   VLDL 17.2 10/05/2013 0939   LDLCALC 111* 10/05/2013 0939      Imaging:  No results found.  TEE 01/06/2014: Study Conclusions  - Left ventricle: Systolic function was normal. The estimated ejection fraction was in the range of 55% to 60%. Wall motion was normal; there were no regional wall motion abnormalities. - Aortic valve: No evidence of vegetation. - Mitral valve: No evidence of vegetation. - Left atrium: The atrium was mildly dilated. No evidence of thrombus in the atrial cavity or appendage. - Atrial septum: There was a patent foramen ovale. - Tricuspid valve: No evidence of vegetation. - Pulmonic valve: No evidence of vegetation.  Impressions:  - Normal LV function; mild LAE; trace MR and TR; patent foramen ovale  noted. There was no evidence of a vegetation on valves or pacemaker leads.  Assessment/Plan:   Principal Problem:   Acute respiratory failure Active Problems:   Type 2 diabetes, uncontrolled, with neuropathy   Non-alcoholic cirrhosis   CAP (community acquired pneumonia)   SOB (shortness of breath)   Acute diastolic CHF (congestive heart failure)   Cirrhosis   Hypotension due to drugs   Congestive heart disease   Iron deficiency anemia due to chronic blood loss   Hyperammonemia   Hypervolemia   Acute on chronic diastolic CHF (congestive heart failure), NYHA class 4   1. Acute on chronic diastolic heart failure 2. Acute on chronic respiratory failure/COPD, now clinically improving 3. Acute kidney injury on background of CK D stage III 4. Hypokalemia: resolved 5. Hyponatremia: 128 6. Anemia: 7.9/23.6 today, very slightly improved 7. Uncontrolled diabetes with complications 8. Nonalcoholic liver cirrhosis with elevated ammonia levels 9. Chronic abdominal pain 10. Chronic gastrointestinal blood loss  BNP elevated c/w acute on chronic diastolic heart failure.EF 55-60% on TEE 12/2013. Marked diuresis yesterday with -2710 and net -4216 since admission. Cr is slightly better today at 1.57 improved from 1.67 yesterday. Tolerated initiation of spironolactone 12.5 mg bid; would keep at present dose today and depending on renal fxn may be able to further titrate to 25 mg bid tomorrow. No longer on metolazone. Will need to f/u renal fxn and serum K closely with aldosterone blockade. Hyponatremia contributed by CHF. Will follow up BNP tomorrow.  Troy Sine, MD, [facility] 05/18/2014, 9:36 AM

## 2014-05-18 NOTE — H&P (Signed)
Scheduled Meds: . insulin aspart  0-9 Units Subcutaneous TID WC  . insulin glargine  10 Units Subcutaneous QHS  . ipratropium-albuterol  3 mL Nebulization TID  . lactulose  10 g Oral BID  . levofloxacin (LEVAQUIN) IV  750 mg Intravenous Q48H  . rifaximin  550 mg Oral BID  . sodium chloride  3 mL Intravenous Q12H  . spironolactone  12.5 mg Oral BID   Continuous Infusions:  PRN Meds:.sodium chloride, dextrose, HYDROmorphone (DILAUDID) injection, ipratropium-albuterol, lactulose, ondansetron (ZOFRAN) IV, oxyCODONE, sodium chloride

## 2014-05-18 NOTE — Evaluation (Signed)
Physical Therapy Evaluation Patient Details Name: [name] MRN: [id number] DOB: May 09, 1950 Today's Date: 05/18/2014   History of Present Illness  64 year old woman, home oxygen dependent, normally on 2 L per nasal cannula 24 hours per day, who presented with worsening shortness of breath. The patient has multiple comorbid medical conditions including chronic abdominal pain, type 2 diabetes, and cirrhosis. The patient is being treated for community-acquired pneumonia. She has acute kidney. Acute diastolic heart failure is thought to be contributing to her symptoms.  Clinical Impression  Pt admitted with above diagnosis. Pt currently with functional limitations due to the deficits listed below (see PT Problem List). Pt would benefit from SNF for therapy prior to d/c home as she has been progressively weak for past months with pt admitting she fell on steps recently.   Will follow acutely.  Pt will benefit from skilled PT to increase their independence and safety with mobility to allow discharge to the venue listed below.      Follow Up Recommendations SNF;Supervision/Assistance - 24 hour    Equipment Recommendations  None recommended by PT    Recommendations for Other Services       Precautions / Restrictions Precautions Precautions: Fall Restrictions Weight Bearing Restrictions: No      Mobility  Bed Mobility Overal bed mobility: Needs Assistance Bed Mobility: Supine to Sit     Supine to sit: Min assist        Transfers Overall transfer level: Needs assistance Equipment used: Rolling walker (2 wheeled) Transfers: Sit to/from Stand Sit to Stand: Min assist            Ambulation/Gait Ambulation/Gait assistance: Min assist;+2 safety/equipment Ambulation Distance (Feet): 15 Feet Assistive device: Rolling walker (2 wheeled) Gait Pattern/deviations: Step-through pattern;Decreased stride length;Trunk flexed;Wide base of support   Gait velocity interpretation:  Below normal speed for age/gender General Gait Details: Pt needed cues to stay close to RW and to sequence steps and RW.  Poor endurance wtih DOE 3/4.   Stairs            Wheelchair Mobility    Modified Rankin (Stroke Patients Only)       Balance Overall balance assessment: Needs assistance;History of Falls Sitting-balance support: No upper extremity supported;Feet supported Sitting balance-Leahy Scale: Fair     Standing balance support: Bilateral upper extremity supported;During functional activity Standing balance-Leahy Scale: Poor Standing balance comment: Pt requires UE support.                              Pertinent Vitals/Pain Pain Assessment: No/denies pain  HR 63-65 bpm, O2 on 4L 91-97%; 128/52 BP.      Home Living Family/patient expects to be discharged to:: Private residence Living Arrangements: Children;Other relatives Available Help at Discharge: Available 24 hours/day (daughter) Type of Home: House Home Access: Stairs to enter Entrance Stairs-Rails: None Entrance Stairs-Number of Steps: 3 Home Layout: One level Home Equipment: Cane - single point;Walker - 2 wheels;Tub bench;Bedside commode (2L homeO2)      Prior Function Level of Independence: Independent with assistive device(s)         Comments: uses RW for all mobility     Hand Dominance   Dominant Hand: Right    Extremity/Trunk Assessment   Upper Extremity Assessment: Defer to OT evaluation           Lower Extremity Assessment: Generalized weakness      Cervical / Trunk Assessment: Normal  Communication  Communication: No difficulties  Cognition Arousal/Alertness: Awake/alert Behavior During Therapy: WFL for tasks assessed/performed Overall Cognitive Status: Within Functional Limits for tasks assessed                      General Comments      Exercises General Exercises - Lower Extremity Ankle Circles/Pumps: AROM;10 reps;Seated Long Arc Quad:  AROM;Both;10 reps;Seated      Assessment/Plan    PT Assessment Patient needs continued PT services  PT Diagnosis Generalized weakness   PT Problem List Decreased mobility;Decreased activity tolerance;Decreased balance;Decreased knowledge of use of DME;Decreased safety awareness;Decreased knowledge of precautions;Decreased strength  PT Treatment Interventions DME instruction;Gait training;Functional mobility training;Therapeutic activities;Therapeutic exercise;Balance training;Patient/family education   PT Goals (Current goals can be found in the Care Plan section) Acute Rehab PT Goals Patient Stated Goal: to go home after some therapy PT Goal Formulation: With patient Time For Goal Achievement: 06/01/14 Potential to Achieve Goals: Good    Frequency Min 3X/week   Barriers to discharge        Co-evaluation               End of Session Equipment Utilized During Treatment: Gait belt Activity Tolerance: Patient limited by fatigue Patient left: in chair;with call bell/phone within reach Nurse Communication: Mobility status         Time: 0942-1006 PT Time Calculation (min) (ACUTE ONLY): 24 min   Charges:   PT Evaluation $Initial PT Evaluation Tier I: 1 Procedure PT Treatments $Gait Training: 8-22 mins   PT G CodesDenice Paradise 2014/06/02, 1:56 PM [facility] Acute Rehabilitation [phone] (pager)

## 2014-05-19 ENCOUNTER — Inpatient Hospital Stay (HOSPITAL_COMMUNITY): Payer: Medicare Other

## 2014-05-19 DIAGNOSIS — J189 Pneumonia, unspecified organism: Secondary | ICD-10-CM

## 2014-05-19 LAB — BASIC METABOLIC PANEL
Anion gap: 9 (ref 5–15)
BUN: 58 mg/dL — ABNORMAL HIGH (ref 6–23)
CO2: 30 mmol/L (ref 19–32)
Calcium: 9.6 mg/dL (ref 8.4–10.5)
Chloride: 90 mmol/L — ABNORMAL LOW (ref 96–112)
Creatinine, Ser: 1.3 mg/dL — ABNORMAL HIGH (ref 0.50–1.10)
GFR calc Af Amer: 50 mL/min — ABNORMAL LOW (ref >90)
GFR calc non Af Amer: 43 mL/min — ABNORMAL LOW (ref >90)
GLUCOSE: 126 mg/dL — AB (ref 70–99)
Potassium: 4.9 mmol/L (ref 3.5–5.1)
SODIUM: 129 mmol/L — AB (ref 135–145)

## 2014-05-19 LAB — CBC
HCT: 25.7 % — ABNORMAL LOW (ref 36.0–46.0)
Hemoglobin: 8.5 g/dL — ABNORMAL LOW (ref 12.0–15.0)
MCH: 28.4 pg (ref 26.0–34.0)
MCHC: 33.1 g/dL (ref 30.0–36.0)
MCV: 86 fL (ref 78.0–100.0)
Platelets: 486 10*3/uL — ABNORMAL HIGH (ref 150–400)
RBC: 2.99 MIL/uL — ABNORMAL LOW (ref 3.87–5.11)
RDW: 16.8 % — ABNORMAL HIGH (ref 11.5–15.5)
WBC: 15.3 10*3/uL — ABNORMAL HIGH (ref 4.0–10.5)

## 2014-05-19 LAB — GLUCOSE, CAPILLARY
GLUCOSE-CAPILLARY: 153 mg/dL — AB (ref 70–99)
GLUCOSE-CAPILLARY: 218 mg/dL — AB (ref 70–99)
Glucose-Capillary: 134 mg/dL — ABNORMAL HIGH (ref 70–99)
Glucose-Capillary: 230 mg/dL — ABNORMAL HIGH (ref 70–99)

## 2014-05-19 LAB — AMMONIA: AMMONIA: 94 umol/L — AB (ref 11–32)

## 2014-05-19 MED ORDER — FUROSEMIDE 10 MG/ML IJ SOLN
40.0000 mg | Freq: Two times a day (BID) | INTRAMUSCULAR | Status: DC
  Administered 2014-05-19 – 2014-05-22 (×7): 40 mg via INTRAVENOUS
  Filled 2014-05-19 (×8): qty 4

## 2014-05-19 MED ORDER — POLYETHYLENE GLYCOL 3350 17 G PO PACK
17.0000 g | PACK | Freq: Two times a day (BID) | ORAL | Status: DC
  Administered 2014-05-19 – 2014-05-20 (×3): 17 g via ORAL
  Filled 2014-05-19 (×6): qty 1

## 2014-05-19 MED ORDER — FUROSEMIDE 10 MG/ML IJ SOLN
INTRAMUSCULAR | Status: AC
  Filled 2014-05-19: qty 4

## 2014-05-19 MED ORDER — SENNOSIDES-DOCUSATE SODIUM 8.6-50 MG PO TABS
1.0000 | ORAL_TABLET | Freq: Two times a day (BID) | ORAL | Status: DC
  Administered 2014-05-19 – 2014-05-20 (×3): 1 via ORAL
  Filled 2014-05-19 (×6): qty 1

## 2014-05-19 MED ORDER — LEVOFLOXACIN 750 MG PO TABS
750.0000 mg | ORAL_TABLET | ORAL | Status: DC
  Administered 2014-05-20: 750 mg via ORAL
  Filled 2014-05-19: qty 1

## 2014-05-19 NOTE — Clinical Social Work Placement (Signed)
   CLINICAL SOCIAL WORK PLACEMENT  NOTE  Date:  05/19/2014  Patient Details  Name: [name] MRN: [id number] Date of Birth: 11-Jan-1951  Clinical Social Work is seeking post-discharge placement for this patient at the [facility] level of care (*CSW will initial, date and re-position this form in  chart as items are completed):  Yes   Patient/family provided with [facility]'s list of facilities offering this level of care within the geographic area requested by the patient (or if unable, by the patient's family).  Yes   Patient/family informed of their freedom to choose among providers that offer the needed level of care, that participate in Medicare, Medicaid or managed care program needed by the patient, have an available bed and are willing to accept the patient.  Yes   Patient/family informed of 's ownership interest in [facility] and [facility], as well as of the fact that they are under no obligation to receive care at these facilities.  PASRR submitted to EDS on 05/18/14     PASRR number received on 05/18/14     Existing PASRR number confirmed on 05/18/14     FL2 transmitted to all facilities in geographic area requested by pt/family on 05/18/14     FL2 transmitted to all facilities within larger geographic area on       Patient informed that his/her managed care company has contracts with or will negotiate with certain facilities, including the following:            Patient/family informed of bed offers received.  Patient chooses bed at       Physician recommends and patient chooses bed at      Patient to be transferred to   on  .  Patient to be transferred to facility by       Patient family notified on   of transfer.  Name of family member notified:        PHYSICIAN       Additional Comment:    _______________________________________________ Rigoberto Noel, LCSW 05/19/2014, 7:43  AM

## 2014-05-19 NOTE — Progress Notes (Signed)
PROGRESS NOTE  [name] VHQ:[id number] DOB: 1950-11-30 DOA: 05/15/2014 PCP: [name], MD  HPI/Recap of past 24 hours: -breathing starting to improve  Assessment/Plan:  1. Acute respiratory failure/SOB (shortness of breath)- Due to #3, and #2 and underlying COPD -improving, off  BIPAP                        -nebs/abx/lasix                        -wean O2down to 2L   2. Acute on chronic diastolic CHF (congestive heart failure) (h/o bradycardia, sinus node dysfunction s/p pacemaker) -lasix per Cards, negative 6.5L, also on aldactone                        -Cards follwing                        -ECHO  adequate LVEF, +diastolic dysfunction  3. CAP (community acquired pneumonia) -on IV Levaquin change to PO, sputum culture -nebs, wean O2  4. AKI,         -due to cardiorenal syndrome        -improving, bmet daily  5. COPD/chronic resp failure       -on 2L home O2   6. Hyponatremia: likely from volume overload, treat chf.      -worsened, treat CHF   6. Type 2 diabetes, uncontrolled, with neuropathy -SSI, HbA1C 9.6 in 03/2014                        - cotninue lantus   7. Non-alcoholic cirrhosis/Cirrhosis/elevated ammonia level -ammonia elevated,  rifaximin and lactulose restarted   -mental status normal                        - GI consult appreciated    8. 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   9. Right ovarian mass: was recently evaluated by gyne onc in 03/2014, determined less likely malignancy inaddition to poor surgical  candidate, recommend repeat ultrasound in 77month.      10. FTT on home oxygen, reported recent fall x1 due to weakness, will get PT eval once stablizd.     11. 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, requiring blood transfusion  Code Status: full  Family Communication: patient  Disposition Plan: Tx to tele   Consultants:  GI  cardiology  Procedures:  bipap briefly  Antibiotics:  levaquin   Objective: BP 124/36 mmHg  Pulse 60  Temp(Src) 98.6 F (37 C) (Oral)  Resp 12  Wt 97.9 kg (215 lb 13.3 oz)  SpO2 100%  Intake/Output Summary (Last 24 hours) at 05/19/14 0746 Last data filed at 05/19/14 0600  Gross per 24 hour  Intake   1090 ml  Output   3550 ml  Net  -2460 ml   Filed Weights   05/17/14 0500 05/18/14 0500  05/19/14 0600  Weight: 108.6 kg (239 lb 6.7 oz) 98.3 kg (216 lb 11.4 oz) 97.9 kg (215 lb 13.3 oz)    Exam:   General:  Obese, on nasal cannula, AAOx3  Cardiovascular: RRR  Respiratory: CTABL,  no wheezing, no rhonchi  Abdomen: Soft/ND/NT, positive BS, +colostomy, peristomal hernia, small ventral hernia, all nontender. No visible blood in colostomy bag during exam today  Musculoskeletal: 1 plus edema  Neuro: aaox3  Data Reviewed: Basic Metabolic Panel:  Recent Labs Lab 05/13/14 1132 05/15/14 2255 05/17/14 0337 05/18/14 0250 05/19/14 0516  NA 131* 129* 131* 128* 129*  K 5.2* 4.8 4.8 4.3 4.9  CL  --  96 94* 89* 90*  CO2 19* '22 22 28 30  '$ GLUCOSE 133 82 261* 161* 126*  BUN 45.0* 43* 54* 62* 58*  CREATININE 1.4* 1.51* 1.67* 1.57* 1.30*  CALCIUM 8.4 9.9 9.7 9.8 9.6  MG  --   --  1.8  --   --    Liver Function Tests:  Recent Labs Lab 05/13/14 1132 05/15/14 2322 05/17/14 0337  AST 42* 40* 32  ALT 32 29 26  ALKPHOS 115 113 102  BILITOT 0.29 0.4 0.5  PROT 7.4  7.8 7.2  ALBUMIN 3.5 3.7 3.3*   No results for input(s): LIPASE, AMYLASE in the last 168 hours.  Recent Labs Lab 05/15/14 2237 05/17/14 0437 05/18/14 0253  AMMONIA 89* 104* 38*   CBC:  Recent Labs Lab 05/13/14 1132 05/15/14 2255 05/17/14 0337 05/19/14 0516  WBC 14.8* 16.0* 11.4* 15.3*  NEUTROABS 8.3*  --   --   --   HGB 7.2* 7.1* 7.9* 8.5*  HCT 22.7* 22.1* 23.6* 25.7*  MCV 84.7 83.7 85.2 86.0  PLT 433* 513* 448* 486*   Cardiac Enzymes:   No results for input(s): CKTOTAL, CKMB, CKMBINDEX, TROPONINI in the last 168 hours. BNP (last 3 results)  Recent Labs  03/04/14 2100 04/12/14 0710 05/15/14 2322  BNP 35.8 311.9* 918.6*    ProBNP (last 3 results)  Recent Labs  07/16/13 0650 09/02/13 1803 03/30/14 1230  PROBNP 933.6* 558.0* 71.0    CBG:  Recent Labs Lab 05/17/14 2203 05/18/14 0802 05/18/14 1227 05/18/14 1721 05/18/14 2145  GLUCAP 266* 156* 161* 183* 214*    Recent Results (from the past 240 hour(s))  Culture, blood (routine x 2)     Status: None (Preliminary result)   Collection Time: 05/15/14 10:57 PM  Result Value Ref Range Status   Specimen Description BLOOD RIGHT HAND  Final   Special Requests BOTTLES DRAWN AEROBIC ONLY 2CC  Final   Culture   Final           BLOOD CULTURE RECEIVED NO GROWTH TO DATE CULTURE WILL BE HELD FOR 5 DAYS BEFORE ISSUING A FINAL NEGATIVE REPORT Performed at [facility]    Report Status PENDING  Incomplete  Culture, blood (routine x 2)     Status: None (Preliminary result)   Collection Time: 05/16/14 12:20 AM  Result Value Ref Range Status   Specimen Description BLOOD RIGHT FOREARM  Final   Special Requests BOTTLES DRAWN AEROBIC AND ANAEROBIC B 3CC R 5CC  Final   Culture   Final           BLOOD CULTURE RECEIVED NO GROWTH TO DATE CULTURE WILL BE HELD FOR 5 DAYS BEFORE ISSUING A FINAL NEGATIVE REPORT Performed at [facility]    Report Status PENDING  Incomplete  MRSA PCR Screening     Status:  None   Collection Time: 05/16/14  8:28 AM  Result Value Ref Range Status   MRSA by PCR NEGATIVE NEGATIVE Final    Comment:        The GeneXpert MRSA Assay (FDA approved for NASAL specimens only), is one component of a comprehensive MRSA colonization surveillance program. It is not intended to diagnose MRSA infection nor to guide or monitor treatment for MRSA infections.      Studies: No results found.  Scheduled Meds: . insulin aspart  0-9 Units Subcutaneous TID WC  . insulin glargine  10 Units Subcutaneous QHS  . ipratropium-albuterol  3 mL Nebulization TID  . lactulose  10 g Oral BID  . levofloxacin  500 mg Oral Daily  . polyethylene glycol  17 g Oral BID  . rifaximin  550 mg Oral BID  . senna-docusate  1 tablet Oral BID  . sodium chloride  3 mL Intravenous Q12H  . spironolactone  12.5 mg Oral BID    Continuous Infusions:    Time spent: >42mns   Meriam Chojnowski MD  [facility] Pager [phone] If 7PM-7AM, please contact night-coverage at [email or web address], password [email or web address] [email or web address]4/21/2016, 7:46 AM  LOS: 3 days

## 2014-05-19 NOTE — Consult Note (Signed)
   [facility] CM Inpatient Consult   05/19/2014  [name] 1950/06/30 [id number]   Patient evaluated for [facility] services. She was active with [facility] last year and was discharged. Discussed [facility] Care Management again. She states the plan is for her to go to SNF at discharge. She wants Probation officer to come back at later time to discuss further.   [name], MSN-Ed, RN,BSN [facility] Liaison [phone]

## 2014-05-19 NOTE — Progress Notes (Signed)
Patient: [name] / Admit Date: 05/15/2014 / Date of Encounter: 05/19/2014, 12:01 PM   Subjective: SOB/LEE improving but not yet to baseline. Gets dyspneic easily with moving about in the room and transferring to chair. Thinks her baseline weight is maybe around 205.   Objective: Telemetry: NSR, occasional PVCs Physical Exam: Blood pressure 135/47, pulse 68, temperature 97.7 F (36.5 C), temperature source Oral, resp. rate 14, weight 215 lb 13.3 oz (97.9 kg), SpO2 99 %. General: Well developed, well nourished WF in no acute distress. Head: Normocephalic, atraumatic, sclera non-icteric, no xanthomas, nares are without discharge. Neck: JVP not elevated. Lungs: Clear bilaterally to auscultation without wheezes, rales, or rhonchi. Breathing is unlabored. Heart: RRR S1 S2 without murmurs, rubs, or gallops.  Abdomen: Soft, non-tender, non-distended. Colostomy bag. Extremities: No clubbing or cyanosis. 1+ BLE edema. Distal pedal pulses are 2+ and equal bilaterally. Neuro: Alert and oriented X 3. Moves all extremities spontaneously. Psych:  Responds to questions appropriately with a normal affect.   Intake/Output Summary (Last 24 hours) at 05/19/14 1201 Last data filed at 05/19/14 1015  Gross per 24 hour  Intake    790 ml  Output   4020 ml  Net  -3230 ml    Inpatient Medications:  . insulin aspart  0-9 Units Subcutaneous TID WC  . insulin glargine  10 Units Subcutaneous QHS  . ipratropium-albuterol  3 mL Nebulization TID  . lactulose  10 g Oral BID  . [START ON 05/20/2014] levofloxacin  750 mg Oral Q48H  . polyethylene glycol  17 g Oral BID  . rifaximin  550 mg Oral BID  . senna-docusate  1 tablet Oral BID  . sodium chloride  3 mL Intravenous Q12H  . spironolactone  12.5 mg Oral BID   Infusions:    Labs:  Recent Labs  05/17/14 0337 05/18/14 0250 05/19/14 0516  NA 131* 128* 129*  K 4.8 4.3 4.9  CL 94* 89* 90*  CO2 '22 28 30  '$ GLUCOSE 261* 161* 126*  BUN 54* 62* 58*    CREATININE 1.67* 1.57* 1.30*  CALCIUM 9.7 9.8 9.6  MG 1.8  --   --     Recent Labs  05/17/14 0337  AST 32  ALT 26  ALKPHOS 102  BILITOT 0.5  PROT 7.2  ALBUMIN 3.3*    Recent Labs  05/17/14 0337 05/19/14 0516  WBC 11.4* 15.3*  HGB 7.9* 8.5*  HCT 23.6* 25.7*  MCV 85.2 86.0  PLT 448* 486*   No results for input(s): CKTOTAL, CKMB, TROPONINI in the last 72 hours. Invalid input(s): POCBNP  Recent Labs  05/17/14 0337  HGBA1C 7.6*     Radiology/Studies:  Ct Abdomen Pelvis Wo Contrast  05/16/2014   CLINICAL DATA:  Generalized weakness, shortness of breath  EXAM: CT ABDOMEN AND PELVIS WITHOUT CONTRAST  TECHNIQUE: Multidetector CT imaging of the abdomen and pelvis was performed following the standard protocol without IV contrast.  COMPARISON:  04/20/2014 radiograph 04/19/2014 CT  FINDINGS: Small right pleural effusion. Mild bibasilar airspace opacities, predominantly linear and lobular subpleural on the left partially imaged cardiac leads within the right atrium and ventricle.  Organ evaluation is limited in the absence of intravenous contrast. Within this limitation, cirrhotic liver morphology. Cholecystectomy. No intrahepatic biliary ductal dilatation mild extrahepatic biliary ductal prominence is nonspecific.  Absent spleen. Nodular soft tissue density left upper quadrant presumably reflects residual splenules or regenerative splenic tissue. No appreciable abnormality of the pancreas. Bilateral adrenal gland nodularity without a dominant/measurable nodule.  Symmetric renal size. Mild bilateral perinephric fat stranding. No hydroureteronephrosis.  Hartmann pouch. Left lower quadrant colostomy. The small bowel loops are of normal caliber. Anterior abdominal wall adhesions. Parastomal hernia containing loops of small bowel without evidence for obstruction or incarceration.  Hysterectomy. 8.8 x 4.3 cm lobulated mass or complex fluid within the operative bed is similar to slightly increased  from the prior. Thin walled bladder.  Advanced atherosclerotic disease of the abdominal aorta and branch vessels.  Diffuse anasarca.  Mild multilevel degenerative changes.  IMPRESSION: Left lower quadrant colostomy. Small bowel containing parastomal hernia. No bowel obstruction.  8.8 x 4.3 cm fluid collection or mass within the hysterectomy bed, similar to slightly increased since the prior. Per prior report, this is previously thought to be of ovarian origin.  Cirrhosis. Mild extrahepatic biliary ductal dilatation is nonspecific post cholecystectomy. Correlate with LFTs and ERCP if warranted.  Small right pleural effusion. Mild bibasilar airspace opacities; atelectasis and/or pneumonia. Small right pleural effusion.   Electronically Signed   By: Carlos Levering M.D.   On: 05/16/2014 05:39   Dg Chest 2 View  05/19/2014   CLINICAL DATA:  Shortness of breath and hypoxia  EXAM: CHEST  2 VIEW  COMPARISON:  Chest x-ray of May 15, 2014 and portable chest x-ray of March 04, 2014.  FINDINGS: The lungs are borderline hypoinflated. There are persistent coarse lung markings at the left lung base laterally. The interstitial markings elsewhere are mildly increased though stable. The cardiac silhouette is enlarged. The central pulmonary vascularity is engorged and there is mild cephalization of the vascular pattern. The permanent pacemaker is unchanged in position. The bony thorax exhibits no acute abnormality.  IMPRESSION: CHF with mild pulmonary interstitial edema not significantly changed from the study of May 15, 2014. Persistent increased density in the left lower lobe posterior laterally is consistent with atelectasis or pneumonia. This finding is new since February of 2016.   Electronically Signed   By: David  Martinique M.D.   On: 05/19/2014 09:18   Ct Abdomen Pelvis W Contrast  04/19/2014   CLINICAL DATA:  Colitis eval for perforated colon Cirrhosis, hernia repair, splecectomy, hyst GB appy, LUL removed. Lung ca  dx 2004. Cervical ca. Known pelvic mass.  EXAM: CT ABDOMEN AND PELVIS WITH CONTRAST  TECHNIQUE: Multidetector CT imaging of the abdomen and pelvis was performed using the standard protocol following bolus administration of intravenous contrast.  CONTRAST:  110m OMNIPAQUE IOHEXOL 300 MG/ML  SOLN  COMPARISON:  04/15/2014, 04/12/2014 and 03/04/2014  FINDINGS: Lung bases are within normal. Pacer leads are present over the heart which is mildly enlarged and unchanged.  Abdominal images demonstrate evidence of a previous cholecystectomy and splenectomy. Mild nodular contour of the liver compatible with history of cirrhosis. Stable 1 cm cystic focus over the pancreatic head and stable 9 mm cystic focus over the pancreatic body. Adrenal glands are unremarkable. Appendix is not seen. There is calcified plaque over the abdominal aorta and iliac arteries.  Kidneys are normal in size without significant hydronephrosis or nephrolithiasis. The ureters are unremarkable.  There are 2 small stable hernias over the epigastric region with the larger just left of midline containing a small amount of fluid. There are postsurgical changes of the abdominal wall with diastases of the rectus abdominus muscles unchanged. Colostomy over the left lower quadrant is unchanged.  There are several contrast filled dilated small bowel loops which can be followed to the anterior midline of the mid abdomen as it is difficult to  follow the small bowel beyond this point as contrast is not present distal to this point. The colostomy defect in the left lower quadrant does contained a few loops of small bowel. There may be developing small bowel obstruction due to adhesions over the anterior mid abdomen versus related to the colostomy site. No free peritoneal air. There has been interval resolution of the previously noted moderate wall thickening involving the cecum and ascending colon.  There is subcutaneous edema over the abdominal wall over the lower  abdomen/pelvis. No change in patient's known pelvic mass thought to be ovarian origin. Evidence of a Hartmann's pouch. Bladder is unremarkable. Remainder the exam is unchanged.  IMPRESSION: Resolution of previously noted significant wall thickening of the ascending colon/cecum. New dilated small bowel loops measuring up to 5 cm in diameter which can be followed to the anterior midline of the mid abdomen as it is difficult to follow the small bowel beyond this point and no significant contrast seen distal to this point. This likely represents developing small-bowel obstruction which may be due to adhesions over the anterior midline of the mid abdomen although may be related to patient's colostomy which contains several small bowel loops herniating through the colostomy defect.  Stable known pelvic mass thought to be ovarian in origin as described on previous CT and ultrasound.  Two small stable pancreatic cystic foci. Recommend followup MRI one year. This recommendation follows ACR consensus guidelines: Managing Incidental Findings on Abdominal CT: White Paper of the ACR Incidental Findings Committee. J Am Coll Radiol 2010;7:754-773  Multiple other chronic stable findings as described.  These results will be called to the ordering clinician or representative by the Radiologist Assistant, and communication documented in the PACS or zVision Dashboard.   Electronically Signed   By: Marin Olp M.D.   On: 04/19/2014 17:03   Dg Chest Portable 1 View  05/15/2014   CLINICAL DATA:  Shortness of breath  EXAM: PORTABLE CHEST - 1 VIEW  COMPARISON:  04/20/2014, 04/21/2013  FINDINGS: Hypoaeration with interstitial and vascular crowding. Central vascular congestion. Aortic atherosclerosis. Enlarged cardiac silhouette. Mild retrocardiac opacity is similar to prior and increased from 2015. Small left pleural effusion not excluded. Underpenetration and osteopenia limits osseous assessment. No overt interval change. Left chest  wall battery pack with unchanged lead tip positions.  IMPRESSION: Central vascular congestion and interstitial prominence may reflect chronic change or atypical/viral infection versus pulmonary edema if acute.  Mild left lower lobe airspace opacity is nonspecific; atelectasis, scarring, or pneumonia. Recommend attention on follow-up with PA and lateral views in 3-4 weeks.   Electronically Signed   By: Carlos Levering M.D.   On: 05/15/2014 23:32   Dg Abd Acute W/chest  04/20/2014   CLINICAL DATA:  Abdominal pain extending from the upper abdomen to the left lower quadrant. Prior diagnosis of small bowel obstruction  EXAM: ACUTE ABDOMEN SERIES (ABDOMEN 2 VIEW & CHEST 1 VIEW)  COMPARISON:  CT 04/19/2014  FINDINGS: Left pacer is in place with leads in the right atrium and right ventricle, unchanged. Chronic interstitial prominence throughout the lungs without confluent airspace opacity or effusion. Heart is normal size.  Gas within mildly prominent bowel loops centrally in the abdomen, some which appear to represent colon. There likely are mildly dilated small bowel loops as well, similar to recent CT. No free air. No organomegaly. Prior cholecystectomy. No acute bony abnormality or suspicious calcification.  IMPRESSION: Mildly prominent mid abdominal bowel loops, likely both large and small bowel. Overall small  bowel dilatation likely similar to recent CT.   Electronically Signed   By: Rolm Baptise M.D.   On: 04/20/2014 15:51     Assessment and Plan  53F with COPD/chronic respiratory failure on home O2, h/o lung cancer, cervical cancer, OSA, DM (uncontrolled K1S 7.6), nonalcoholic liver cirrhosis, chronic abdominal pain, chronic GI blood loss (colostomy) admitted with CAP, acute diastolic CHF, AKI on CKD stage III, hypokalemia, hyponatremia, acute on chronic anemia. Also of note had right ovarian mass evaluated in 03/2014 deemed less likely malignancy in addition to poor surgical candidate, planned f/u.  1.  Acute on chronic diastolic CHF - 0.1U, weights inconsistent but about -15lb since admission. Baseline weight said to be around 205. - on Lasix '80mg'$  PO BID at home - last dose of IV Lasix here was 4/19 I think because the order ended after 4 doses when it was first written. She has since been started on spironolactone.  She did continue to diurese well yesterday. She still appears volume overloaded and I suspect she'll need more than just low dose spironolactone on board. Will resume Lasix '40mg'$  IV BID and follow renal function, sodium and volume status - need to watch K with spironolactone - 4.9 today  2. AKI on CKD stage III - creatinine improving, sodium marginally better than yesterday, may be due in part to CHF and lung process - follow daily  3. Nonalcoholic liver cirrhosis - most recent albumin 3.3 - treatment of hyperammonemia per IM  Signed, Melina Copa PA-C Pager: [phone]   Personally seen and examined. Agree with above. More fluid to go! IV lasix resumed. Watch K  RRR  Candee Furbish, MD

## 2014-05-19 NOTE — Clinical Social Work Note (Signed)
Clinical Social Work Assessment  Patient Details  Name: [name] MRN: [id number] Date of Birth: 12/01/1950  Date of referral:  05/18/14               Reason for consult:  Facility Placement                Permission sought to share information with:  Facility Sport and exercise psychologist, Family Supports Permission granted to share information::  Yes, Verbal Permission Granted  Name::     Youth worker::  [facility]  Relationship::  son  Contact Information:     Housing/Transportation Living arrangements for the past 2 months:  [place] of Information:  Patient Patient Interpreter Needed:  None Criminal Activity/Legal Involvement Pertinent to Current Situation/Hospitalization:  No - Comment as needed Significant Relationships:  Adult Children Lives with:  Adult Children Do you feel safe going back to the place where you live?  Yes Need for family participation in patient care:  Yes (Comment)  Care giving concerns:  Pt normally lives at home with her son and dtr in law- pt states she has plenty of support at home   Facilities manager / plan:  CSW spoke with pt concerning PT recommendation for SNF- pt is agreeable to SNF placement for short term rehab and is hopeful that she will only need rehab for a short period of time.  Employment status:  Retired Forensic scientist:  Medicare PT Recommendations:  [place] / Referral to community resources:  [place]  Patient/Family's Response to care:  Patient is agreeable to going to [facility] - pt states that she has been there in the past and that the facility is right be her home which allows her family to visit often and provide support  Patient/Family's Understanding of and Emotional Response to Diagnosis, Current Treatment, and Prognosis:  Pt understands the severity of her condition upon admission and expressed relief that she could breath now-  pt was scared by needing a bipap when she came to the hospital.  Emotional Assessment Appearance:  Disheveled, Appears older than stated age Attitude/Demeanor/Rapport:    Affect (typically observed):  Appropriate, Pleasant Orientation:  Oriented to Self, Oriented to Place, Oriented to  Time, Oriented to Situation Alcohol / Substance use:  Not Applicable Psych involvement (Current and /or in the community):  No (Comment)  Discharge Needs  Concerns to be addressed:  No discharge needs identified Readmission within the last 30 days:  Yes Current discharge risk:  None Barriers to Discharge:  Continued Medical Work up   [employer], LCSW 05/19/2014, 1:56 PM

## 2014-05-20 ENCOUNTER — Ambulatory Visit: Payer: Medicare Other | Admitting: Internal Medicine

## 2014-05-20 DIAGNOSIS — E8771 Transfusion associated circulatory overload: Secondary | ICD-10-CM

## 2014-05-20 LAB — BASIC METABOLIC PANEL
Anion gap: 12 (ref 5–15)
BUN: 48 mg/dL — AB (ref 6–23)
CO2: 29 mmol/L (ref 19–32)
Calcium: 9.5 mg/dL (ref 8.4–10.5)
Chloride: 86 mmol/L — ABNORMAL LOW (ref 96–112)
Creatinine, Ser: 1.13 mg/dL — ABNORMAL HIGH (ref 0.50–1.10)
GFR calc Af Amer: 59 mL/min — ABNORMAL LOW (ref >90)
GFR calc non Af Amer: 51 mL/min — ABNORMAL LOW (ref >90)
GLUCOSE: 128 mg/dL — AB (ref 70–99)
Potassium: 4.6 mmol/L (ref 3.5–5.1)
Sodium: 127 mmol/L — ABNORMAL LOW (ref 135–145)

## 2014-05-20 LAB — CBC
HCT: 24.7 % — ABNORMAL LOW (ref 36.0–46.0)
Hemoglobin: 8.1 g/dL — ABNORMAL LOW (ref 12.0–15.0)
MCH: 28 pg (ref 26.0–34.0)
MCHC: 32.8 g/dL (ref 30.0–36.0)
MCV: 85.5 fL (ref 78.0–100.0)
Platelets: 413 10*3/uL — ABNORMAL HIGH (ref 150–400)
RBC: 2.89 MIL/uL — ABNORMAL LOW (ref 3.87–5.11)
RDW: 16.3 % — AB (ref 11.5–15.5)
WBC: 12.8 10*3/uL — ABNORMAL HIGH (ref 4.0–10.5)

## 2014-05-20 LAB — GLUCOSE, CAPILLARY
GLUCOSE-CAPILLARY: 131 mg/dL — AB (ref 70–99)
GLUCOSE-CAPILLARY: 168 mg/dL — AB (ref 70–99)
Glucose-Capillary: 173 mg/dL — ABNORMAL HIGH (ref 70–99)
Glucose-Capillary: 145 mg/dL — ABNORMAL HIGH (ref 70–99)

## 2014-05-20 LAB — AMMONIA: Ammonia: 62 umol/L — ABNORMAL HIGH (ref 11–32)

## 2014-05-20 MED ORDER — HYDRALAZINE HCL 25 MG PO TABS
25.0000 mg | ORAL_TABLET | Freq: Two times a day (BID) | ORAL | Status: DC
  Administered 2014-05-20 – 2014-05-23 (×7): 25 mg via ORAL
  Filled 2014-05-20 (×8): qty 1

## 2014-05-20 MED ORDER — LEVOFLOXACIN 500 MG PO TABS
500.0000 mg | ORAL_TABLET | Freq: Every day | ORAL | Status: DC
  Administered 2014-05-21: 500 mg via ORAL
  Filled 2014-05-20 (×2): qty 1

## 2014-05-20 NOTE — Progress Notes (Signed)
Attempted to call report. RN in contact room.  RN to call back.

## 2014-05-20 NOTE — Progress Notes (Signed)
Patient arrived to the unit. Patient A/O, VSS, paced on the monitor. POC discussed with the patient, patient currently OOB in chair. No concerns/complaints at this time. afleming RN

## 2014-05-20 NOTE — Progress Notes (Signed)
Utilization review completed.  

## 2014-05-20 NOTE — Progress Notes (Signed)
Patient Name: KEARRA CALKIN Date of Encounter: 05/20/2014   Principal Problem:   Acute respiratory failure Active Problems:   CAP (community acquired pneumonia)   Acute on chronic diastolic CHF (congestive heart failure), NYHA class 4   Cirrhosis   Hyperammonemia   Hypervolemia   Hypotension due to drugs   Type 2 diabetes, uncontrolled, with neuropathy   Non-alcoholic cirrhosis   SOB (shortness of breath)   Iron deficiency anemia due to chronic blood loss  SUBJECTIVE  Breathing improving, though not back to baseline.  CURRENT MEDS . furosemide  40 mg Intravenous BID  . insulin aspart  0-9 Units Subcutaneous TID WC  . insulin glargine  10 Units Subcutaneous QHS  . lactulose  10 g Oral BID  . levofloxacin  750 mg Oral Q48H  . polyethylene glycol  17 g Oral BID  . rifaximin  550 mg Oral BID  . senna-docusate  1 tablet Oral BID  . sodium chloride  3 mL Intravenous Q12H  . spironolactone  12.5 mg Oral BID    OBJECTIVE  Filed Vitals:   05/20/14 0748 05/20/14 0800 05/20/14 1106 05/20/14 1154  BP:  162/71  165/54  Pulse:  63  66  Temp:    98.3 F (36.8 C)  TempSrc:    Oral  Resp:  8  12  Weight: 216 lb 0.8 oz (98 kg)     SpO2:  100% 95% 98%    Intake/Output Summary (Last 24 hours) at 05/20/14 1225 Last data filed at 05/20/14 1211  Gross per 24 hour  Intake    440 ml  Output   7865 ml  Net  -7425 ml   Filed Weights   05/20/14 0405 05/20/14 0744 05/20/14 0748  Weight: 225 lb 5 oz (102.2 kg) 220 lb 14.4 oz (100.2 kg) 216 lb 0.8 oz (98 kg)    PHYSICAL EXAM  General: Pleasant, NAD. Neuro: Alert and oriented X 3. Moves all extremities spontaneously. Psych: Normal affect. HEENT:  Normal  Neck: Supple without bruits.  Difficult to assess jvp 2/2 girth. Lungs:  Resp regular and unlabored, CTA. Heart: RRR no s3, s4, or murmurs. Abdomen: firm, diffusely tender, protuberant, BS + x 4.  Extremities: No clubbing, cyanosis.  2+ bilat LE edema to knees.  DP/PT/Radials 1+ and equal bilaterally.  Accessory Clinical Findings  CBC  Recent Labs  05/19/14 0516 05/20/14 0345  WBC 15.3* 12.8*  HGB 8.5* 8.1*  HCT 25.7* 24.7*  MCV 86.0 85.5  PLT 486* 449*   Basic Metabolic Panel  Recent Labs  05/19/14 0516 05/20/14 0345  NA 129* 127*  K 4.9 4.6  CL 90* 86*  CO2 30 29  GLUCOSE 126* 128*  BUN 58* 48*  CREATININE 1.30* 1.13*  CALCIUM 9.6 9.5   TELE  A paced.  Radiology/Studies  Dg Chest 2 View  05/19/2014   CLINICAL DATA:  Shortness of breath and hypoxia  EXAM: CHEST  2 VIEW  COMPARISON:  Chest x-ray of May 15, 2014 and portable chest x-ray of March 04, 2014.  FINDINGS: The lungs are borderline hypoinflated. There are persistent coarse lung markings at the left lung base laterally. The interstitial markings elsewhere are mildly increased though stable. The cardiac silhouette is enlarged. The central pulmonary vascularity is engorged and there is mild cephalization of the vascular pattern. The permanent pacemaker is unchanged in position. The bony thorax exhibits no acute abnormality.  IMPRESSION: CHF with mild pulmonary interstitial edema not significantly changed from the study  of May 15, 2014. Persistent increased density in the left lower lobe posterior laterally is consistent with atelectasis or pneumonia. This finding is new since February of 2016.   Electronically Signed   By: David  Martinique M.D.   On: 05/19/2014 09:18    ASSESSMENT AND PLAN  76F with COPD/chronic respiratory failure on home O2, h/o lung cancer, cervical cancer, OSA, DM (uncontrolled R8X 7.6), nonalcoholic liver cirrhosis, chronic abdominal pain, chronic GI blood loss (colostomy) admitted with CAP, acute diastolic CHF, AKI on CKD stage III, hypokalemia, hyponatremia, acute on chronic anemia. Also of note had right ovarian mass evaluated in 03/2014 deemed less likely malignancy in addition to poor surgical candidate, planned f/u.  1. Acute on chronic  diastolic CHF - minus 15 L since admission.  Wt down ~ 20 lbs if accurate.  Dry wt  ~ 205 (was 205 between Jan and Feb this year). - Still with volume overload.  Wt 216 this AM. - Renal fxn stable.   - Cont IV diuresis. - BP poorly controlled.  HR stable (a paced). - Resume hydralazine - was on 37.5 mg bid @ home.  Will start with 25 bid.  2. AKI on CKD stage III - creat improved. - Na down. - Follow with diuresis.  3. Nonalcoholic liver cirrhosis - most recent albumin 3.3 - treatment of hyperammonemia per IM  4.  CAP - Abx per IM.  5.  DM II - Glucose stable. - Insulin mgmt per IM.  6.  Hyponatremia - Relatively stable. - Follow.  7. Normocytic anemia - stable.  Signed, Murray Hodgkins NP    Patient seen and examined. Agree with assessment and plan. I/O since admission -15,026; peak weight 239-->216, but question accuracy. Breathing better. LE edema persists, but improving. Renal fxn better. Will f/u bnp in am.   Troy Sine, MD, [facility] 05/20/2014 2:44 PM

## 2014-05-20 NOTE — Progress Notes (Signed)
Physical Therapy Treatment Patient Details Name: [name] MRN: [id number] DOB: 11-Aug-1950 Today's Date: 05/20/2014    History of Present Illness 64 year old woman, home oxygen dependent, normally on 2 L per nasal cannula 24 hours per day, who presented with worsening shortness of breath. The patient has multiple comorbid medical conditions including chronic abdominal pain, type 2 diabetes, and cirrhosis. The patient is being treated for community-acquired pneumonia. She has acute kidney. Acute diastolic heart failure is thought to be contributing to her symptoms and we are asked to see her in consultation.    PT Comments    Pt with excellent progression today able to ambulate in hallway and maintain sats on 2L with 95%. Pt encouraged to continue mobility with nursing and to continue HEP. Pt with right thigh pain persisting from recent fall of which pt states she never got imaging for. Will continue to follow to progress independence and mobility.   Follow Up Recommendations  SNF;Supervision/Assistance - 24 hour     Equipment Recommendations       Recommendations for Other Services       Precautions / Restrictions Precautions Precautions: Fall Restrictions Weight Bearing Restrictions: No    Mobility  Bed Mobility               General bed mobility comments: pt in chair on arrival and returned to chair  Transfers     Transfers: Sit to/from Stand Sit to Stand: Supervision         General transfer comment: cues for safety  Ambulation/Gait Ambulation/Gait assistance: Supervision Ambulation Distance (Feet): 100 Feet Assistive device: Rolling walker (2 wheeled) Gait Pattern/deviations: Step-through pattern;Decreased stride length   Gait velocity interpretation: Below normal speed for age/gender General Gait Details: cues for posture and to stay in RW   Stairs            Wheelchair Mobility    Modified Rankin (Stroke Patients Only)        Balance                                    Cognition Arousal/Alertness: Awake/alert Behavior During Therapy: WFL for tasks assessed/performed Overall Cognitive Status: Within Functional Limits for tasks assessed                      Exercises General Exercises - Lower Extremity Long Arc Quad: AROM;Both;15 reps;Seated Hip Flexion/Marching: AROM;Seated;Both;10 reps    General Comments        Pertinent Vitals/Pain Pain Assessment: 0-10 Pain Score: 4  Pain Location: right thigh from previous    Home Living                      Prior Function            PT Goals (current goals can now be found in the care plan section) Progress towards PT goals: Progressing toward goals    Frequency       PT Plan Current plan remains appropriate    Co-evaluation             End of Session Equipment Utilized During Treatment: Oxygen Activity Tolerance: Patient tolerated treatment well Patient left: in chair;with call bell/phone within reach     Time: 1025-1055 PT Time Calculation (min) (ACUTE ONLY): 30 min  Charges:  $Gait Training: 8-22 mins $Therapeutic Exercise: 8-22 mins  G CodesMelford Aase Jun 02, 2014, 11:11 AM Elwyn Reach, [place]

## 2014-05-20 NOTE — Progress Notes (Signed)
PROGRESS NOTE  TRISTYN PHARRIS JKK:[id number] DOB: 05-29-1950 DOA: 05/15/2014 PCP: [name], MD  HPI/Recap of past 24 hours: -breathing starting to improve  Assessment/Plan:  1. Acute respiratory failure/SOB (shortness of breath)- Due to #3, and #2 and underlying COPD -improving, off  BIPAP                        -nebs/abx/lasix                        -wean O2down to 2L   2. Acute on chronic diastolic CHF (congestive heart failure) (h/o bradycardia, sinus node dysfunction s/p pacemaker) -IV lasix per Cards, negative 15L, also on aldactone                        -Cards follwing                        -ECHO  adequate LVEF, +diastolic dysfunction  3. CAP (community acquired pneumonia) -Levaquin changed to PO, sputum culture -nebs, wean O2  4. AKI,         -due to cardiorenal syndrome        -improving, bmet daily  5. COPD/chronic resp failure       -on 2L home O2   6. Hyponatremia: likely from volume overload, treat chf.      -worsened, treat CHF      -diuretics contributing too   6. Type 2 diabetes, uncontrolled, with neuropathy -SSI, HbA1C 9.6 in 03/2014                        - cotninue lantus   7. Non-alcoholic cirrhosis/Cirrhosis/elevated ammonia level -ammonia elevated,  rifaximin and lactulose restarted   -mental status normal                        - GI consult appreciated    8. 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   9. Right ovarian mass: was recently evaluated by gyne onc in 03/2014, determined less likely malignancy  inaddition to poor surgical candidate, recommend repeat ultrasound in 35month.      10. FTT on home oxygen, reported recent fall x1 due to weakness, will get PT eval once stablized.    11. 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, requiring blood transfusion  Ambulate, PT  Code Status: full  Family Communication: patient  Disposition Plan: Tx to tele   Consultants:  GI  cardiology  Procedures:  bipap briefly  Antibiotics:  levaquin   Objective: BP 165/54 mmHg  Pulse 66  Temp(Src) 98.3 F (36.8 C) (Oral)  Resp 12  Wt 98 kg (216 lb 0.8 oz)  SpO2 98%  Intake/Output Summary (Last 24 hours) at 05/20/14 1320 Last data filed at 05/20/14 1211  Gross per 24 hour  Intake    440 ml  Output   7065 ml  Net  -6625  ml   Filed Weights   05/20/14 0405 05/20/14 0744 05/20/14 0748  Weight: 102.2 kg (225 lb 5 oz) 100.2 kg (220 lb 14.4 oz) 98 kg (216 lb 0.8 oz)    Exam:   General:  Obese, on nasal cannula, AAOx3  Cardiovascular: RRR  Respiratory: CTABL,  no wheezing, no rhonchi  Abdomen: Soft/ND/NT, positive BS, +colostomy, peristomal hernia, small ventral hernia, all nontender. No visible blood in colostomy bag during exam today  Musculoskeletal: 1 plus edema  Neuro: aaox3  Data Reviewed: Basic Metabolic Panel:  Recent Labs Lab 05/15/14 2255 05/17/14 0337 05/18/14 0250 05/19/14 0516 05/20/14 0345  NA 129* 131* 128* 129* 127*  K 4.8 4.8 4.3 4.9 4.6  CL 96 94* 89* 90* 86*  CO2 '22 22 28 30 29  '$ GLUCOSE 82 261* 161* 126* 128*  BUN 43* 54* 62* 58* 48*  CREATININE 1.51* 1.67* 1.57* 1.30* 1.13*  CALCIUM 9.9 9.7 9.8 9.6 9.5  MG  --  1.8  --   --   --    Liver Function Tests:  Recent Labs Lab 05/15/14 2322 05/17/14 0337  AST 40* 32  ALT 29 26  ALKPHOS 113 102  BILITOT 0.4 0.5    PROT 7.8 7.2  ALBUMIN 3.7 3.3*   No results for input(s): LIPASE, AMYLASE in the last 168 hours.  Recent Labs Lab 05/15/14 2237 05/17/14 0437 05/18/14 0253 05/19/14 0516 05/20/14 0345  AMMONIA 89* 104* 38* 94* 62*   CBC:  Recent Labs Lab 05/15/14 2255 05/17/14 0337 05/19/14 0516 05/20/14 0345  WBC 16.0* 11.4* 15.3* 12.8*  HGB 7.1* 7.9* 8.5* 8.1*  HCT 22.1* 23.6* 25.7* 24.7*  MCV 83.7 85.2 86.0 85.5  PLT 513* 448* 486* 413*   Cardiac Enzymes:   No results for input(s): CKTOTAL, CKMB, CKMBINDEX, TROPONINI in the last 168 hours. BNP (last 3 results)  Recent Labs  03/04/14 2100 04/12/14 0710 05/15/14 2322  BNP 35.8 311.9* 918.6*    ProBNP (last 3 results)  Recent Labs  07/16/13 0650 09/02/13 1803 03/30/14 1230  PROBNP 933.6* 558.0* 71.0    CBG:  Recent Labs Lab 05/19/14 1243 05/19/14 1653 05/19/14 2123 05/20/14 0756 05/20/14 1221  GLUCAP 153* 218* 230* 131* 173*    Recent Results (from the past 240 hour(s))  Culture, blood (routine x 2)     Status: None (Preliminary result)   Collection Time: 05/15/14 10:57 PM  Result Value Ref Range Status   Specimen Description BLOOD RIGHT HAND  Final   Special Requests BOTTLES DRAWN AEROBIC ONLY 2CC  Final   Culture   Final           BLOOD CULTURE RECEIVED NO GROWTH TO DATE CULTURE WILL BE HELD FOR 5 DAYS BEFORE ISSUING A FINAL NEGATIVE REPORT Performed at [facility]    Report Status PENDING  Incomplete  Culture, blood (routine x 2)     Status: None (Preliminary result)   Collection Time: 05/16/14 12:20 AM  Result Value Ref Range Status   Specimen Description BLOOD RIGHT FOREARM  Final   Special Requests BOTTLES DRAWN AEROBIC AND ANAEROBIC B 3CC R 5CC  Final   Culture   Final           BLOOD CULTURE RECEIVED NO GROWTH TO DATE CULTURE WILL BE HELD FOR 5 DAYS BEFORE ISSUING A FINAL NEGATIVE REPORT Performed at [facility]    Report Status PENDING  Incomplete  MRSA PCR Screening      Status: None  Collection Time: 05/16/14  8:28 AM  Result Value Ref Range Status   MRSA by PCR NEGATIVE NEGATIVE Final    Comment:        The GeneXpert MRSA Assay (FDA approved for NASAL specimens only), is one component of a comprehensive MRSA colonization surveillance program. It is not intended to diagnose MRSA infection nor to guide or monitor treatment for MRSA infections.      Studies: Dg Chest 2 View  05/19/2014   CLINICAL DATA:  Shortness of breath and hypoxia  EXAM: CHEST  2 VIEW  COMPARISON:  Chest x-ray of May 15, 2014 and portable chest x-ray of March 04, 2014.  FINDINGS: The lungs are borderline hypoinflated. There are persistent coarse lung markings at the left lung base laterally. The interstitial markings elsewhere are mildly increased though stable. The cardiac silhouette is enlarged. The central pulmonary vascularity is engorged and there is mild cephalization of the vascular pattern. The permanent pacemaker is unchanged in position. The bony thorax exhibits no acute abnormality.  IMPRESSION: CHF with mild pulmonary interstitial edema not significantly changed from the study of May 15, 2014. Persistent increased density in the left lower lobe posterior laterally is consistent with atelectasis or pneumonia. This finding is new since February of 2016.   Electronically Signed   By: David  Martinique M.D.   On: 05/19/2014 09:18    Scheduled Meds: . furosemide  40 mg Intravenous BID  . hydrALAZINE  25 mg Oral BID  . insulin aspart  0-9 Units Subcutaneous TID WC  . insulin glargine  10 Units Subcutaneous QHS  . lactulose  10 g Oral BID  . levofloxacin  750 mg Oral Q48H  . polyethylene glycol  17 g Oral BID  . rifaximin  550 mg Oral BID  . senna-docusate  1 tablet Oral BID  . sodium chloride  3 mL Intravenous Q12H  . spironolactone  12.5 mg Oral BID    Continuous Infusions:    Time spent: >56mns   Yael Angerer MD  [facility] Pager [phone] If  7PM-7AM, please contact night-coverage at [email or web address], password [email or web address]4/22/2016, 1:20 PM  LOS: 4 days

## 2014-05-21 LAB — GLUCOSE, CAPILLARY
GLUCOSE-CAPILLARY: 137 mg/dL — AB (ref 70–99)
Glucose-Capillary: 122 mg/dL — ABNORMAL HIGH (ref 70–99)
Glucose-Capillary: 175 mg/dL — ABNORMAL HIGH (ref 70–99)
Glucose-Capillary: 150 mg/dL — ABNORMAL HIGH (ref 70–99)
Glucose-Capillary: 150 mg/dL — ABNORMAL HIGH (ref 70–99)

## 2014-05-21 LAB — BASIC METABOLIC PANEL
ANION GAP: 11 (ref 5–15)
BUN: 44 mg/dL — ABNORMAL HIGH (ref 6–23)
CHLORIDE: 88 mmol/L — AB (ref 96–112)
CO2: 29 mmol/L (ref 19–32)
Calcium: 9.9 mg/dL (ref 8.4–10.5)
Creatinine, Ser: 1.13 mg/dL — ABNORMAL HIGH (ref 0.50–1.10)
GFR calc Af Amer: 59 mL/min — ABNORMAL LOW (ref >90)
GFR calc non Af Amer: 51 mL/min — ABNORMAL LOW (ref >90)
Glucose, Bld: 129 mg/dL — ABNORMAL HIGH (ref 70–99)
POTASSIUM: 4.2 mmol/L (ref 3.5–5.1)
SODIUM: 128 mmol/L — AB (ref 135–145)

## 2014-05-21 LAB — AMMONIA: Ammonia: 56 umol/L — ABNORMAL HIGH (ref 11–32)

## 2014-05-21 LAB — BRAIN NATRIURETIC PEPTIDE: B Natriuretic Peptide: 120.7 pg/mL — ABNORMAL HIGH (ref 0.0–100.0)

## 2014-05-21 MED ORDER — POLYETHYLENE GLYCOL 3350 17 G PO PACK
17.0000 g | PACK | Freq: Every day | ORAL | Status: DC | PRN
  Filled 2014-05-21: qty 1

## 2014-05-21 MED ORDER — SENNOSIDES-DOCUSATE SODIUM 8.6-50 MG PO TABS
1.0000 | ORAL_TABLET | Freq: Every evening | ORAL | Status: DC | PRN
  Filled 2014-05-21: qty 1

## 2014-05-21 NOTE — Progress Notes (Signed)
Subjective:  C/o nausea that started this am.  Does not feel well.  Says breathing is better today.  Remains edematous despite significant diuresis.  Objective:  Vital Signs in the last 24 hours: BP 169/58 mmHg  Pulse 66  Temp(Src) 98.6 F (37 C) (Oral)  Resp 17  Ht '5\' 3"'$  (1.6 m)  Wt 95.709 kg (211 lb)  BMI 37.39 kg/m2  SpO2 99%  Physical Exam: Morbidly obese white female sitting up at site of bed in no acute distress  Lungs: Reduced breath sounds  Cardiac:  Regular rhythm, normal S1 and S2, no S3, 1 to 2/6 systolic murmur at aortic valve Abdomen:  Distended, nontender,  no masses Extremities:  2+ edema present  Intake/Output from previous day: 04/22 0701 - 04/23 0700 In: 440 [P.O.:440] Out: 5230 [Urine:5200; Stool:30] Weight Filed Weights   05/20/14 0744 05/20/14 0748 05/21/14 0522  Weight: 100.2 kg (220 lb 14.4 oz) 98 kg (216 lb 0.8 oz) 95.709 kg (211 lb)    Lab Results: Basic Metabolic Panel:  Recent Labs  05/20/14 0345 05/21/14 0427  NA 127* 128*  K 4.6 4.2  CL 86* 88*  CO2 29 29  GLUCOSE 128* 129*  BUN 48* 44*  CREATININE 1.13* 1.13*    CBC:  Recent Labs  05/19/14 0516 05/20/14 0345  WBC 15.3* 12.8*  HGB 8.5* 8.1*  HCT 25.7* 24.7*  MCV 86.0 85.5  PLT 486* 413*    BNP    Component Value Date/Time   BNP 120.7* 05/21/2014 0427    PROTIME: Lab Results  Component Value Date   INR 1.05 04/11/2014   INR 1.11 01/06/2014   INR 1.07 01/04/2014    Telemetry: Atrial paced  Assessment/Plan:  1.  Acute on chronic diastolic heart failure continues to diurese but still volume overloaded 2.  Nausea of uncertain etiology 3.  COPD/obesity hypoventilation syndrome on home O2 4.  Nonalcoholic cirrhosis with chronic abdominal pain and nausea 5.  Anemia  Recommendations:  Her BNP is down from previous but remains volume overloaded.  Continue intravenous diuresis for the time being.  Suspect that her dry weight is somewhat around 205 pounds.       Kerry Hough  MD [facility]  05/21/2014, 11:08 AM

## 2014-05-21 NOTE — Progress Notes (Signed)
PROGRESS NOTE  [name] PJK:[id number] DOB: 1950-07-02 DOA: 05/15/2014 PCP: [name], MD  HPI/Recap of past 24 hours: -  Assessment/Plan:  1. Acute respiratory failure/SOB (shortness of breath)- Due to #3, and #2 and underlying COPD -improved, required BIPAP initially                        -nebs/abx/lasix                        -wean O2down to 2L   2. Acute on chronic diastolic CHF (congestive heart failure) (h/o bradycardia, sinus node dysfunction s/p pacemaker) -cotninue IV lasix per Cards, negative 19L, also on aldactone                        -Cards follwing                        -ECHO  adequate LVEF, +diastolic dysfunction  3. CAP (community acquired pneumonia) -Levaquin changed to PO, sputum culture negative -nebs, wean O2  4. AKI,         -due to cardiorenal syndrome        -improving, bmet daily  5. COPD/chronic resp failure       -on 2L home O2   6. Hyponatremia: likely from volume overload, treat chf.      -worsened, treat CHF      -diuretics contributing too   6. Type 2 diabetes, uncontrolled, with neuropathy -SSI, HbA1C 9.6 in 03/2014                        - cotninue lantus   7. Non-alcoholic cirrhosis/Cirrhosis/elevated ammonia level -ammonia elevated,  rifaximin and lactulose restarted   -mental status normal                        - GI consult appreciated    8. 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   9. Right ovarian mass: was recently evaluated by gyne onc in 03/2014, determined less likely malignancy  inaddition to poor surgical candidate, recommend repeat ultrasound in 68month.      10. FTT on home oxygen, reported recent fall x1 due to weakness, will get PT eval once stablized.    11. 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2 kg (220 lb 14.4 oz) 98 kg (216 lb 0.8 oz) 95.709 kg (211 lb)    Exam:   General:  Obese, on nasal cannula, AAOx3  Cardiovascular: RRR  Respiratory: CTABL,  no wheezing, no rhonchi  Abdomen: Soft/ND/NT, positive BS, +colostomy, peristomal hernia, small ventral hernia, all nontender. No visible blood in colostomy bag during exam today  Musculoskeletal: tarce edema  Neuro: aaox3  Data Reviewed: Basic Metabolic Panel:  Recent Labs Lab 05/17/14 0337 05/18/14 0250 05/19/14 0516 05/20/14 0345 05/21/14 0427  NA 131* 128* 129* 127* 128*  K 4.8 4.3 4.9 4.6 4.2  CL 94* 89* 90* 86* 88*  CO2 '22 28 30 29 29  '$ GLUCOSE 261* 161* 126* 128* 129*  BUN 54* 62* 58* 48* 44*  CREATININE 1.67* 1.57* 1.30* 1.13* 1.13*  CALCIUM 9.7 9.8 9.6 9.5 9.9  MG 1.8  --   --   --   --    Liver Function Tests:  Recent Labs Lab 05/15/14 2322 05/17/14 0337  AST 40* 32  ALT 29  26  ALKPHOS 113 102  BILITOT 0.4 0.5  PROT 7.8 7.2  ALBUMIN 3.7 3.3*   No results for input(s): LIPASE, AMYLASE in the last 168 hours.  Recent Labs Lab 05/17/14 0437 05/18/14 0253 05/19/14 0516 05/20/14 0345 05/21/14 0427  AMMONIA 104* 38* 94* 62* 56*   CBC:  Recent Labs Lab 05/15/14 2255 05/17/14 0337 05/19/14 0516 05/20/14 0345  WBC 16.0* 11.4* 15.3* 12.8*  HGB 7.1* 7.9* 8.5* 8.1*  HCT 22.1* 23.6* 25.7* 24.7*  MCV 83.7 85.2 86.0 85.5  PLT 513* 448* 486* 413*   Cardiac Enzymes:   No results for input(s): CKTOTAL, CKMB, CKMBINDEX, TROPONINI in the last 168 hours. BNP (last 3 results)  Recent Labs  04/12/14 0710 05/15/14 2322 05/21/14 0427  BNP 311.9* 918.6* 120.7*    ProBNP (last 3 results)  Recent Labs  07/16/13 0650 09/02/13 1803 03/30/14 1230  PROBNP 933.6* 558.0* 71.0    CBG:  Recent Labs Lab 05/20/14 1635 05/20/14 2053 05/21/14 0615 05/21/14 0646 05/21/14 1118  GLUCAP 145* 168* 122* 137* 175*    Recent Results (from the past 240 hour(s))  Culture, blood (routine x 2)     Status: None (Preliminary result)   Collection Time: 05/15/14 10:57 PM  Result Value Ref Range Status   Specimen Description BLOOD RIGHT HAND  Final   Special Requests BOTTLES DRAWN AEROBIC ONLY 2CC  Final   Culture   Final           BLOOD CULTURE RECEIVED NO GROWTH TO DATE CULTURE WILL BE HELD FOR 5 DAYS BEFORE ISSUING A FINAL NEGATIVE REPORT Performed at [facility]    Report Status PENDING  Incomplete  Culture, blood (routine x 2)     Status: None (Preliminary result)   Collection Time: 05/16/14 12:20 AM  Result Value Ref Range Status   Specimen Description BLOOD RIGHT FOREARM  Final   Special Requests BOTTLES DRAWN AEROBIC AND ANAEROBIC B 3CC R 5CC  Final   Culture   Final           BLOOD CULTURE RECEIVED NO GROWTH TO DATE CULTURE WILL BE HELD FOR 5 DAYS BEFORE ISSUING A FINAL NEGATIVE REPORT Performed at [facility]    Report Status  PENDING  Incomplete  MRSA PCR Screening     Status: None  Collection Time: 05/16/14  8:28 AM  Result Value Ref Range Status   MRSA by PCR NEGATIVE NEGATIVE Final    Comment:        The GeneXpert MRSA Assay (FDA approved for NASAL specimens only), is one component of a comprehensive MRSA colonization surveillance program. It is not intended to diagnose MRSA infection nor to guide or monitor treatment for MRSA infections.      Studies: No results found.  Scheduled Meds: . furosemide  40 mg Intravenous BID  . hydrALAZINE  25 mg Oral BID  . insulin aspart  0-9 Units Subcutaneous TID WC  . insulin glargine  10 Units Subcutaneous QHS  . lactulose  10 g Oral BID  . levofloxacin  500 mg Oral Daily  . rifaximin  550 mg Oral BID  . sodium chloride  3 mL Intravenous Q12H  . spironolactone  12.5 mg Oral BID    Continuous Infusions:    Time spent: >69mns   Andreina Outten MD  [facility] Pager [phone] If 7PM-7AM, please contact night-coverage at [email or web address], password [email or web address]4/23/2016, 1:59 PM  LOS: 5 days

## 2014-05-22 LAB — GLUCOSE, CAPILLARY
GLUCOSE-CAPILLARY: 138 mg/dL — AB (ref 70–99)
Glucose-Capillary: 169 mg/dL — ABNORMAL HIGH (ref 70–99)
Glucose-Capillary: 169 mg/dL — ABNORMAL HIGH (ref 70–99)
Glucose-Capillary: 176 mg/dL — ABNORMAL HIGH (ref 70–99)

## 2014-05-22 LAB — BASIC METABOLIC PANEL
Anion gap: 10 (ref 5–15)
BUN: 47 mg/dL — AB (ref 6–23)
CHLORIDE: 86 mmol/L — AB (ref 96–112)
CO2: 30 mmol/L (ref 19–32)
CREATININE: 1.34 mg/dL — AB (ref 0.50–1.10)
Calcium: 9.5 mg/dL (ref 8.4–10.5)
GFR calc Af Amer: 48 mL/min — ABNORMAL LOW (ref >90)
GFR calc non Af Amer: 41 mL/min — ABNORMAL LOW (ref >90)
Glucose, Bld: 169 mg/dL — ABNORMAL HIGH (ref 70–99)
Potassium: 4.4 mmol/L (ref 3.5–5.1)
Sodium: 126 mmol/L — ABNORMAL LOW (ref 135–145)

## 2014-05-22 LAB — CULTURE, BLOOD (ROUTINE X 2)
Culture: NO GROWTH
Culture: NO GROWTH

## 2014-05-22 LAB — AMMONIA: Ammonia: 51 umol/L — ABNORMAL HIGH (ref 11–32)

## 2014-05-22 MED ORDER — FUROSEMIDE 40 MG PO TABS
60.0000 mg | ORAL_TABLET | Freq: Two times a day (BID) | ORAL | Status: DC
  Administered 2014-05-22 – 2014-05-23 (×2): 60 mg via ORAL
  Filled 2014-05-22 (×4): qty 1

## 2014-05-22 NOTE — Progress Notes (Signed)
Subjective:  Less nausea today.  Feeling somewhat better.  Says that she is going out to rehabilitation tomorrow.  Weight continues to be up and her renal function is slightly worse today.  Objective:  Vital Signs in the last 24 hours: BP 128/47 mmHg  Pulse 65  Temp(Src) 98.7 F (37.1 C) (Oral)  Resp 18  Ht '5\' 3"'$  (1.6 m)  Wt 95.6 kg (210 lb 12.2 oz)  BMI 37.34 kg/m2  SpO2 97%  Physical Exam: Morbidly obese white female sitting up at the bedside in a chair in no acute distress  Lungs: Reduced breath sounds  Cardiac:  Regular rhythm, normal S1 and S2, no S3, 1 to 2/6 systolic murmur at aortic valve Abdomen:  Distended, nontender,  no masses Extremities:  2+ edema present  Intake/Output from previous day: 04/23 0701 - 04/24 0700 In: 1120 [P.O.:1120] Out: 3725 [Urine:3725] Weight Filed Weights   05/20/14 0748 05/21/14 0522 05/22/14 0446  Weight: 98 kg (216 lb 0.8 oz) 95.709 kg (211 lb) 95.6 kg (210 lb 12.2 oz)    Lab Results: Basic Metabolic Panel:  Recent Labs  05/21/14 0427 05/22/14 0410  NA 128* 126*  K 4.2 4.4  CL 88* 86*  CO2 29 30  GLUCOSE 129* 169*  BUN 44* 47*  CREATININE 1.13* 1.34*    CBC:  Recent Labs  05/20/14 0345  WBC 12.8*  HGB 8.1*  HCT 24.7*  MCV 85.5  PLT 413*    BNP    Component Value Date/Time   BNP 120.7* 05/21/2014 0427    PROTIME: Lab Results  Component Value Date   INR 1.05 04/11/2014   INR 1.11 01/06/2014   INR 1.07 01/04/2014    Telemetry: Atrial paced  Assessment/Plan:  1.  Acute on chronic diastolic heart failure continues to diurese but still volume overloaded 2.  Nausea of uncertain etiology 3.  COPD/obesity hypoventilation syndrome on home O2 4.  Nonalcoholic cirrhosis with chronic abdominal pain and nausea 5.  Anemia may be contributing somewhat to dyspnea.  Recommendations:  Okay for her to change to by mouth diuretics.      Kerry Hough  MD [facility]  05/22/2014, 11:23 AM

## 2014-05-22 NOTE — Progress Notes (Addendum)
PROGRESS NOTE  [name] ZCH:[id number] DOB: [birth year] DOA: 05/15/2014 PCP: [name], MD  HPI/Recap of past 24 hours: -feels good, breathing at baseline, +bm  Assessment/Plan:  1. Acute respiratory failure/SOB (shortness of breath)- Due to #3, and #2 and underlying COPD -improved, required BIPAP initially                        -improved with nebs/abx/lasix                        -wean O2down to 2L   2. Acute on chronic diastolic CHF (congestive heart failure) (h/o bradycardia, sinus node dysfunction s/p pacemaker) -negative 21L, weight down 20lbs, also on aldactone                        -Cards follwing, change to PO diuretics today, creatinine starting to trend up                         -ECHO  adequate LVEF, +diastolic dysfunction  3. CAP (community acquired pneumonia) -Levaquin changed to PO, sputum culture negative -nebs, wean O2                         -completed 7days, stop levaquin  4. AKI,        -due to cardiorenal syndrome        -now trending up, change lasix to PO  5. COPD/chronic resp failure       -on 2L home O2   6. Hyponatremia: likely from volume overload, treat chf.      -worsened, treat CHF      -diuretics contributing too   6. Type 2 diabetes, uncontrolled, with neuropathy -SSI, HbA1C 9.6 in 03/2014                        - cotninue lantus   7. Non-alcoholic cirrhosis/Cirrhosis/elevated ammonia level -ammonia elevated,  rifaximin and lactulose restarted   -mental status normal                        - GI consult appreciated    8. 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   9. Right ovarian mass: was recently evaluated by gyne onc in 03/2014, determined less likely malignancy inaddition to poor surgical candidate, recommend repeat ultrasound in 81month.      10. FTT on home oxygen, reported recent fall x1 due to weakness, will get PT eval once stablized.    11. 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, requiring blood transfusion  Ambulate, PT  Code Status: full  Family Communication: patient  Disposition Plan: SNF Monday    Consultants:  GI  cardiology  Procedures:  bipap briefly  Antibiotics:  levaquin   Objective: BP 128/47 mmHg  Pulse 65  Temp(Src) 98.7 F (37.1 C) (Oral)  Resp 18  Ht  $'5\' 3"'s$  (1.6 m)  Wt 95.6 kg (210 lb 12.2 oz)  BMI 37.34 kg/m2  SpO2 97%  Intake/Output Summary (Last 24 hours) at 05/22/14 0844 Last data filed at 05/22/14 9485  Gross per 24 hour  Intake   1000 ml  Output   4175 ml  Net  -3175 ml   Filed Weights   05/20/14 0748 05/21/14 0522 05/22/14 0446  Weight: 98 kg (216 lb 0.8 oz) 95.709 kg (211 lb) 95.6 kg (210 lb 12.2 oz)    Exam:   General:  Obese, on nasal cannula, AAOx3  Cardiovascular: RRR  Respiratory: CTABL,  no wheezing, no rhonchi  Abdomen: Soft/ND/NT, positive BS, +colostomy, peristomal hernia, small ventral hernia, all nontender. colostomy  Musculoskeletal: tarce edema  Neuro: aaox3  Data Reviewed: Basic Metabolic Panel:  Recent Labs Lab 05/17/14 0337 05/18/14 0250 05/19/14 0516 05/20/14 0345 05/21/14 0427 05/22/14 0410  NA 131* 128* 129* 127* 128* 126*  K 4.8 4.3 4.9 4.6 4.2 4.4  CL 94* 89* 90* 86* 88* 86*  CO2 '22 28 30 29 29 30  '$ GLUCOSE 261* 161* 126* 128* 129* 169*  BUN 54* 62* 58* 48* 44* 47*  CREATININE 1.67* 1.57*  1.30* 1.13* 1.13* 1.34*  CALCIUM 9.7 9.8 9.6 9.5 9.9 9.5  MG 1.8  --   --   --   --   --    Liver Function Tests:  Recent Labs Lab 05/15/14 2322 05/17/14 0337  AST 40* 32  ALT 29 26  ALKPHOS 113 102  BILITOT 0.4 0.5  PROT 7.8 7.2  ALBUMIN 3.7 3.3*   No results for input(s): LIPASE, AMYLASE in the last 168 hours.  Recent Labs Lab 05/18/14 0253 05/19/14 0516 05/20/14 0345 05/21/14 0427 05/22/14 0410  AMMONIA 38* 94* 62* 56* 51*   CBC:  Recent Labs Lab 05/15/14 2255 05/17/14 0337 05/19/14 0516 05/20/14 0345  WBC 16.0* 11.4* 15.3* 12.8*  HGB 7.1* 7.9* 8.5* 8.1*  HCT 22.1* 23.6* 25.7* 24.7*  MCV 83.7 85.2 86.0 85.5  PLT 513* 448* 486* 413*   Cardiac Enzymes:   No results for input(s): CKTOTAL, CKMB, CKMBINDEX, TROPONINI in the last 168 hours. BNP (last 3 results)  Recent Labs  04/12/14 0710 05/15/14 2322 05/21/14 0427  BNP 311.9* 918.6* 120.7*    ProBNP (last 3 results)  Recent Labs  07/16/13 0650 09/02/13 1803 03/30/14 1230  PROBNP 933.6* 558.0* 71.0    CBG:  Recent Labs Lab 05/21/14 0646 05/21/14 1118 05/21/14 1616 05/21/14 2134 05/22/14 0609  GLUCAP 137* 175* 150* 150* 169*    Recent Results (from the past 240 hour(s))  Culture, blood (routine x 2)     Status: None   Collection Time: 05/15/14 10:57 PM  Result Value Ref Range Status   Specimen Description BLOOD RIGHT HAND  Final   Special Requests BOTTLES DRAWN AEROBIC ONLY 2CC  Final   Culture   Final    NO GROWTH 5 DAYS Note: Culture results may be compromised due to an inadequate volume of blood received in culture bottles. Performed at [facility]    Report Status 05/22/2014 FINAL  Final  Culture, blood (routine x 2)     Status: None   Collection Time: 05/16/14 12:20 AM  Result Value Ref Range Status   Specimen Description BLOOD RIGHT FOREARM  Final   Special Requests BOTTLES DRAWN AEROBIC AND ANAEROBIC B 3CC R 5CC  Final   Culture   Final    NO GROWTH 5  DAYS Performed  at [facility]    Report Status 05/22/2014 FINAL  Final  MRSA PCR Screening     Status: None   Collection Time: 05/16/14  8:28 AM  Result Value Ref Range Status   MRSA by PCR NEGATIVE NEGATIVE Final    Comment:        The GeneXpert MRSA Assay (FDA approved for NASAL specimens only), is one component of a comprehensive MRSA colonization surveillance program. It is not intended to diagnose MRSA infection nor to guide or monitor treatment for MRSA infections.      Studies: No results found.  Scheduled Meds: . furosemide  40 mg Intravenous BID  . hydrALAZINE  25 mg Oral BID  . insulin aspart  0-9 Units Subcutaneous TID WC  . insulin glargine  10 Units Subcutaneous QHS  . lactulose  10 g Oral BID  . levofloxacin  500 mg Oral Daily  . rifaximin  550 mg Oral BID  . sodium chloride  3 mL Intravenous Q12H  . spironolactone  12.5 mg Oral BID    Continuous Infusions:    Time spent: >47mns   Arriyana Rodell MD  [facility] Pager [phone] If 7PM-7AM, please contact night-coverage at [email or web address], password [email or web address]4/24/2016, 8:44 AM  LOS: 6 days

## 2014-05-23 ENCOUNTER — Telehealth: Payer: Self-pay | Admitting: Internal Medicine

## 2014-05-23 LAB — GLUCOSE, CAPILLARY
GLUCOSE-CAPILLARY: 143 mg/dL — AB (ref 70–99)
Glucose-Capillary: 169 mg/dL — ABNORMAL HIGH (ref 70–99)

## 2014-05-23 LAB — BASIC METABOLIC PANEL
Anion gap: 10 (ref 5–15)
BUN: 42 mg/dL — ABNORMAL HIGH (ref 6–23)
CO2: 28 mmol/L (ref 19–32)
CREATININE: 1.28 mg/dL — AB (ref 0.50–1.10)
Calcium: 9.6 mg/dL (ref 8.4–10.5)
Chloride: 88 mmol/L — ABNORMAL LOW (ref 96–112)
GFR calc Af Amer: 51 mL/min — ABNORMAL LOW (ref >90)
GFR calc non Af Amer: 44 mL/min — ABNORMAL LOW (ref >90)
Glucose, Bld: 151 mg/dL — ABNORMAL HIGH (ref 70–99)
POTASSIUM: 4.2 mmol/L (ref 3.5–5.1)
SODIUM: 126 mmol/L — AB (ref 135–145)

## 2014-05-23 LAB — AMMONIA: Ammonia: 40 umol/L — ABNORMAL HIGH (ref 11–32)

## 2014-05-23 MED ORDER — INSULIN GLARGINE 100 UNIT/ML ~~LOC~~ SOLN: 10.0000 [IU] | Freq: Every day | SUBCUTANEOUS | Status: DC

## 2014-05-23 MED ORDER — SPIRONOLACTONE 25 MG PO TABS: 12.5000 mg | ORAL_TABLET | Freq: Two times a day (BID) | ORAL | Status: DC

## 2014-05-23 MED ORDER — HYDRALAZINE HCL 25 MG PO TABS
25.0000 mg | ORAL_TABLET | Freq: Two times a day (BID) | ORAL | Status: DC
Start: 2014-05-23 — End: 2014-06-29

## 2014-05-23 MED ORDER — DICYCLOMINE HCL 10 MG PO CAPS: 10.0000 mg | ORAL_CAPSULE | Freq: Three times a day (TID) | ORAL | Status: DC | PRN

## 2014-05-23 MED ORDER — OXYCODONE HCL 20 MG PO TABS: 20.0000 mg | ORAL_TABLET | ORAL | Status: DC | PRN

## 2014-05-23 NOTE — Progress Notes (Signed)
05/23/2014 2:14 PM Pt. D/C to [facility] via [facility]. [place]

## 2014-05-23 NOTE — Clinical Social Work Note (Signed)
Patient will discharge to [facility] Anticipated discharge date:05/23/14 Family notified: pt to notify daughter Transportation by [facility]- scheduled for 2pm  CSW signing off.  Domenica Reamer, [place] Social Worker [phone]

## 2014-05-23 NOTE — Progress Notes (Signed)
Patient: [name] / Admit Date: 05/15/2014 / Date of Encounter: 05/23/2014, 10:38 AM   Subjective: No chest pain. SOB improving. LEE persists but improving, and softer. Weight back near baseline of 205.  Objective: Telemetry: NSR, occasional V pacing, occasional AV pacing Physical Exam: Blood pressure 120/48, pulse 64, temperature 98 F (36.7 C), temperature source Oral, resp. rate 18, height '5\' 3"'$  (1.6 m), weight 205 lb 6.4 oz (93.169 kg), SpO2 100 %. General: Well developed, well nourished WF in no acute distress. Head: Normocephalic, atraumatic, sclera non-icteric, no xanthomas, nares are without discharge. Neck: JVP not elevated. Lungs: Clear bilaterally to auscultation without wheezes, rales, or rhonchi. Breathing is unlabored. Heart: RRR S1 S2 without murmurs, rubs, or gallops.  Abdomen: Soft, non-tender, non-distended. Colostomy bag. Extremities: No clubbing or cyanosis. Trace BLE edema.  Neuro: Alert and oriented X 3. Moves all extremities spontaneously. Psych: Responds to questions appropriately with a normal affect.   Intake/Output Summary (Last 24 hours) at 05/23/14 1038 Last data filed at 05/23/14 0554  Gross per 24 hour  Intake    582 ml  Output   3150 ml  Net  -2568 ml    Inpatient Medications:  . furosemide  60 mg Oral BID  . hydrALAZINE  25 mg Oral BID  . insulin aspart  0-9 Units Subcutaneous TID WC  . insulin glargine  10 Units Subcutaneous QHS  . lactulose  10 g Oral BID  . rifaximin  550 mg Oral BID  . sodium chloride  3 mL Intravenous Q12H  . spironolactone  12.5 mg Oral BID   Infusions:    Labs:  Recent Labs  05/22/14 0410 05/23/14 0510  NA 126* 126*  K 4.4 4.2  CL 86* 88*  CO2 30 28  GLUCOSE 169* 151*  BUN 47* 42*  CREATININE 1.34* 1.28*  CALCIUM 9.5 9.6   No results for input(s): AST, ALT, ALKPHOS, BILITOT, PROT, ALBUMIN in the last 72 hours. No results for input(s): WBC, NEUTROABS, HGB, HCT, MCV, PLT in the last 72 hours. No  results for input(s): CKTOTAL, CKMB, TROPONINI in the last 72 hours. Invalid input(s): POCBNP No results for input(s): HGBA1C in the last 72 hours.   Radiology/Studies:  Ct Abdomen Pelvis Wo Contrast  05/16/2014   CLINICAL DATA:  Generalized weakness, shortness of breath  EXAM: CT ABDOMEN AND PELVIS WITHOUT CONTRAST  TECHNIQUE: Multidetector CT imaging of the abdomen and pelvis was performed following the standard protocol without IV contrast.  COMPARISON:  04/20/2014 radiograph 04/19/2014 CT  FINDINGS: Small right pleural effusion. Mild bibasilar airspace opacities, predominantly linear and lobular subpleural on the left partially imaged cardiac leads within the right atrium and ventricle.  Organ evaluation is limited in the absence of intravenous contrast. Within this limitation, cirrhotic liver morphology. Cholecystectomy. No intrahepatic biliary ductal dilatation mild extrahepatic biliary ductal prominence is nonspecific.  Absent spleen. Nodular soft tissue density left upper quadrant presumably reflects residual splenules or regenerative splenic tissue. No appreciable abnormality of the pancreas. Bilateral adrenal gland nodularity without a dominant/measurable nodule. Symmetric renal size. Mild bilateral perinephric fat stranding. No hydroureteronephrosis.  Hartmann pouch. Left lower quadrant colostomy. The small bowel loops are of normal caliber. Anterior abdominal wall adhesions. Parastomal hernia containing loops of small bowel without evidence for obstruction or incarceration.  Hysterectomy. 8.8 x 4.3 cm lobulated mass or complex fluid within the operative bed is similar to slightly increased from the prior. Thin walled bladder.  Advanced atherosclerotic disease of the abdominal aorta and branch  vessels.  Diffuse anasarca.  Mild multilevel degenerative changes.  IMPRESSION: Left lower quadrant colostomy. Small bowel containing parastomal hernia. No bowel obstruction.  8.8 x 4.3 cm fluid collection or  mass within the hysterectomy bed, similar to slightly increased since the prior. Per prior report, this is previously thought to be of ovarian origin.  Cirrhosis. Mild extrahepatic biliary ductal dilatation is nonspecific post cholecystectomy. Correlate with LFTs and ERCP if warranted.  Small right pleural effusion. Mild bibasilar airspace opacities; atelectasis and/or pneumonia. Small right pleural effusion.   Electronically Signed   By: Carlos Levering M.D.   On: 05/16/2014 05:39   Dg Chest 2 View  05/19/2014   CLINICAL DATA:  Shortness of breath and hypoxia  EXAM: CHEST  2 VIEW  COMPARISON:  Chest x-ray of May 15, 2014 and portable chest x-ray of March 04, 2014.  FINDINGS: The lungs are borderline hypoinflated. There are persistent coarse lung markings at the left lung base laterally. The interstitial markings elsewhere are mildly increased though stable. The cardiac silhouette is enlarged. The central pulmonary vascularity is engorged and there is mild cephalization of the vascular pattern. The permanent pacemaker is unchanged in position. The bony thorax exhibits no acute abnormality.  IMPRESSION: CHF with mild pulmonary interstitial edema not significantly changed from the study of May 15, 2014. Persistent increased density in the left lower lobe posterior laterally is consistent with atelectasis or pneumonia. This finding is new since February of 2016.   Electronically Signed   By: David  Martinique M.D.   On: 05/19/2014 09:18   Dg Chest Portable 1 View  05/15/2014   CLINICAL DATA:  Shortness of breath  EXAM: PORTABLE CHEST - 1 VIEW  COMPARISON:  04/20/2014, 04/21/2013  FINDINGS: Hypoaeration with interstitial and vascular crowding. Central vascular congestion. Aortic atherosclerosis. Enlarged cardiac silhouette. Mild retrocardiac opacity is similar to prior and increased from 2015. Small left pleural effusion not excluded. Underpenetration and osteopenia limits osseous assessment. No overt interval  change. Left chest wall battery pack with unchanged lead tip positions.  IMPRESSION: Central vascular congestion and interstitial prominence may reflect chronic change or atypical/viral infection versus pulmonary edema if acute.  Mild left lower lobe airspace opacity is nonspecific; atelectasis, scarring, or pneumonia. Recommend attention on follow-up with PA and lateral views in 3-4 weeks.   Electronically Signed   By: Carlos Levering M.D.   On: 05/15/2014 23:32     Assessment and Plan  30F with COPD/chronic respiratory failure on home O2, h/o lung cancer, sinus node dysfunction s/p PPM 08/2013, cervical cancer, OSA, DM (uncontrolled H4R 7.6), nonalcoholic liver cirrhosis, chronic abdominal pain, chronic GI blood loss (colostomy) admitted with CAP, acute diastolic CHF, AKI on CKD stage III, hypokalemia, hyponatremia, acute on chronic anemia. Also of note had right ovarian mass evaluated in 03/2014 deemed less likely malignancy in addition to poor surgical candidate, planned f/u.  1. Acute on chronic diastolic CHF - now back to baseline weight of 205, net -24L - continue current dose of Lasix, spironolactone - potassium stable on spironolactone - will discuss with MD whether there is any role for tolvaptan given persistent hyponatremia? - tentative f/u appointment scheduled with Kerin Ransom PA-C 05/31/14 at 8:30am (tried to schedule with Richardson Dopp for continuity but his next appointment is either tomorrow or too far out)  2. AKI on CKD stage III; hyponatremia - low sodium level may be due to combination of CHF and lung disease - creatinine, K generally stable - would continue to hold  ARB given recent AKI  3. 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 CAP 5. COPD with chronic respiratory failure on home O2 6. Normocytic anemia in setting of chronic GI blood los from colostomy 7. Diabetes mellitus    Signed, Melina Copa PA-C Pager: [phone]  Agree with assessment and plan. For DC to rehab today with OV in 1 week for cardiology f/u.   Troy Sine, MD, [facility] 05/23/2014 5:02 PM

## 2014-05-23 NOTE — Progress Notes (Signed)
Medicare Important Message given? YES  (If response is "NO", the following Medicare IM given date fields will be blank)  Date Medicare IM given: 05/23/14 Medicare IM given by:  Dahlia Client [employer]

## 2014-05-23 NOTE — Care Management Note (Signed)
    Page 1 of 1   05/23/2014     1:44:37 PM CARE MANAGEMENT NOTE 05/23/2014  Patient:  [name], [name]   Account Number:  [id number]  Date Initiated:  05/17/2014  Documentation initiated by:  MAYO,HENRIETTA  Subjective/Objective Assessment:   dx resp failure; lives with family, active with [facility]    PCP  Alysia Penna     Action/Plan:   Anticipated DC Date:  05/23/2014   Anticipated DC Plan:  [place]  CM consult      Choice offered to / List presented to:             Status of service:  Completed, signed off Medicare Important Message given?  YES (If response is "NO", the following Medicare IM given date fields will be blank) Date Medicare IM given:  05/23/2014 Medicare IM given by:  Marvetta Gibbons Date Additional Medicare IM given:   Additional Medicare IM given by:    Discharge Disposition:  [facility]  Per UR Regulation:  Reviewed for med. necessity/level of care/duration of stay  If discussed at [place] of Stay Meetings, dates discussed:    Comments:  05/23/14- 71- Shenise Wolgamott Laurena Bering, BSN [phone] Pt lives at home with family- plan is to go to [facility] for rehab- baseline pt has home 02 with [facility]

## 2014-05-23 NOTE — Progress Notes (Signed)
  Pharmacy Discharge Medication Therapy Review   Total Number of meds on admission: 20 (polypharmacy > 10 meds)  Indications for all medications: '[x]'$  Yes       '[]'$  No  Adherence Review  '[]'$  Excellent (no doses missed/week)     '[x]'$  Good (no more than 1 dose missed/week)     '[]'$  Partial (2-3 doses missed/week)     '[]'$  Poor (>3 doses missed/week)  Total number of high risk medications: 5 (Anticoagulants, Dual antiplatelets, oral Antihyperglycemic agents, Insulins, Antipsychotics, Anti-Seizure meds, Inhalers, HF/ACS meds, Antibiotics and HIV medications)   Assessment: (Medication related problems)  Intervention  YES NO  Explanation   Indications      Medication without noted indication '[]'$  '[x]'$     Indication without noted medication '[]'$  '[x]'$     Duplicate therapy '[x]'$  '[]'$  Bentyl and Robaxin for muscle spasms - patient reports using Bentyl TID and Robaxin just as needed. She was not aware these have the same indication.  Efficacy      Suboptimal drug or dose selection '[]'$  '[x]'$    Insufficient dose/duration '[]'$  '[x]'$    Failure to receive therapy  (Rx not filled) '[]'$  '[x]'$     Safety      Adverse drug event '[]'$  '[x]'$     Drug interaction '[]'$  '[x]'$     Excessive dose/duration '[]'$  '[x]'$    High-risk medications '[x]'$  '[]'$  Patient on U-500 insulin PTA (0.3 mL TID = 500 units daily) confirmed by pharmacy and outpatient endocrinology notes. Patient reports compliance with U-500 insulin but that CBGs were in the 200-300s at home. Here, patient is on Lantus 10 units daily + SSI and CBGs have all been controlled <200. Of note, A1c has improved from 9.6>>7.6 from March to April 2016.  Compliance     Underuse '[x]'$  '[]'$  Patient reports adherence to insulin but no explanation as to CBG control here on Lantus 10 units daily + SSI while PTA dose of U-500 insulin is equivalent to ~500 units daily (which patient reported high CBGs on). Assuming some level of non-adherence?   Overuse '[]'$  '[x]'$    Other pertinent pharmacist counseling '[x]'$  '[]'$   Reviewed all medications with patient - discussed inhalers in detail - difference between maintenance inhaler and rescue inhaler, proper inhalation technique, and rinsing mouth after Advair use.     Total number of new medications upon discharge: 2  Time:  Time spent preparing for discharge counseling: 60 minutes Time spent counseling patient: 15 minutes   PLAN:  Patient to be discharged on Lantus 10 units daily. Her CBGs this admit have remained controlled on Lantus 10 units daily + meal time SSI despite this dose being dramatically lower than her U-500 PTA dose of ~0.3 mL TID (equivalent to ~500 units of insulin per day). Patient's endocrinologist, Dr. Cruzita Lederer, has been notified of this dose change. Patient was unsure of the timing of her next endocrine appointment.  Discussed duplicate Robaxin and Bentyl for muscle spasms. Patient will continue to use Bentyl scheduled, has not needed Robaxin too much. She was unaware these medications are both used to treat her muscle spasms.   Ademide Schaberg E. Demetrick Eichenberger, Pharm.D Clinical Pharmacy Resident Pager: [phone] 05/23/2014 11:01 AM

## 2014-05-23 NOTE — Telephone Encounter (Signed)
New message    tcm appt on  5.3.2016 with Kerin Ransom. Per Melina Copa

## 2014-05-23 NOTE — Discharge Summary (Addendum)
Physician Discharge Summary  [name]:[id number] DOB: [birth year] DOA: 05/15/2014  PCP: [name], MD  Admit date: 05/15/2014 Discharge date: 05/23/2014  Time spent: 45 minutes  Recommendations for Outpatient Follow-up:  1. Kerin Ransom with [facility]  5/3, check Bmet at FU  2. Fu with GYN for Right Ovarian Mass  Discharge Diagnoses:  Principal Problem:   Acute respiratory failure   Acute on chronic diastolic CHF (congestive heart failure), NYHA class 4   Type 2 diabetes, uncontrolled, with neuropathy   Non-alcoholic cirrhosis   CAP (community acquired pneumonia)   SOB (shortness of breath)   Cirrhosis   Hypotension due to drugs   Iron deficiency anemia due to chronic blood loss   Hyperammonemia   Hypervolemia   Obesity   CKD 3   Right ovarian mass  Discharge Condition: stable  Diet recommendation: low sodium  Filed Weights   05/21/14 0522 05/22/14 0446 05/23/14 0243  Weight: 95.709 kg (211 lb) 95.6 kg (210 lb 12.2 oz) 93.169 kg (205 lb 6.4 oz)    History of present illness:  Chief Complaint: Worsening SOB and Swelling  HPI: [name] is a 64 y.o. female with a history of NASH Cirrhosis, COPD, Chronic Diastolic CHF, Chronic Respiratory Failure, DM2, HTN, Chronic ABD Pain who presented to the ED with complaints of worsening SOB x 2 days. She also reported that she had increased edema and orthopnea. She also had chest congestion with nonproductive cough and chest congestion and wheezing  Hospital Course:  Acute respiratory failure/SOB (shortness of breath)- Due to #3, and #2 and underlying COPD -improved, required BIPAP initially  -improved with nebs/abx/lasix  -weaned O2down to [facility] O2  2. Acute on chronic diastolic CHF (congestive heart failure) (h/o bradycardia, sinus node dysfunction s/p pacemaker) -Diuresed with  IV lasix, negative 21L, weight down 20lbs, also on aldactone  -Followed by Cardiology, changed to PO diuretics yetserday, creatinine stable at baseline  -ECHO adequate LVEF, +diastolic dysfunction                        -FU with Cards 5/3  3. CAP (community acquired pneumonia)                         -improved, was on Iv levaquin -sputum culture negative          -completed 7days, stop levaquin  4. AKI,  -due to cardiorenal syndrome  -improved, from creatinine of 1.6 on admission to 1.2 at discharge        -now on PO lasix  5. COPD/chronic resp failure  -stable, on 2L home O2  6. Hyponatremia: likely from volume overload, treat chf.  -due to CHF, diuretics  -stable and asymptomatic   6. Type 2 diabetes, uncontrolled, with neuropathy -SSI, HbA1C 9.6 in 03/2014  - cotninue lantus   7. Non-alcoholic cirrhosis/Cirrhosis/elevated ammonia level -ammonia elevated initially,  rifaximin and lactulose restarted  -mental status normal  - Was seen by GI earlier this admission, medically managed   8. Acute of chronic blood loss anemia:   -secondary to known chronic blood loss from colostomy , CKD,  hemodilution for volume overload also contributing, -received 1 unit PRBC this admission, hb stable since   9. Right ovarian mass: was recently evaluated by gyne onc in 03/2014, determined less likely malignancy inaddition to poor surgical candidate, recommend repeat ultrasound in 58month.   10. Constipation due to narcotics -improved on miralax and  senokot   Consultations:  Cardiology  Discharge Exam: Filed Vitals:   05/23/14 0940  BP: 120/48  Pulse:    Temp:   Resp:     General: AAOx3 Cardiovascular: S1S2/RRR Respiratory: CTAB  Discharge Instructions   Discharge Instructions    Diet - low sodium heart healthy    Complete by:  As directed      Diet Carb Modified    Complete by:  As directed      Increase activity slowly    Complete by:  As directed           Current Discharge Medication List    START taking these medications   Details  insulin glargine (LANTUS) 100 UNIT/ML injection Inject 0.1 mLs (10 Units total) into the skin at bedtime.    spironolactone (ALDACTONE) 25 MG tablet Take 0.5 tablets (12.5 mg total) by mouth 2 (two) times daily.      CONTINUE these medications which have CHANGED   Details  dicyclomine (BENTYL) 10 MG capsule Take 1 capsule (10 mg total) by mouth 3 (three) times daily as needed for spasms.    hydrALAZINE (APRESOLINE) 25 MG tablet Take 1 tablet (25 mg total) by mouth 2 (two) times daily.    Oxycodone HCl 20 MG TABS Take 1 tablet (20 mg total) by mouth every 3 (three) hours as needed (pain). Qty: 30 tablet, Refills: 0      CONTINUE these medications which have NOT CHANGED   Details  albuterol (PROVENTIL) (2.5 MG/3ML) 0.083% nebulizer solution Take 3 mLs (2.5 mg total) by nebulization every 4 (four) hours as needed for wheezing or shortness of breath. Qty: 75 mL, Refills: 12    albuterol-ipratropium (COMBIVENT) 18-103 MCG/ACT inhaler Inhale 2 puffs into the lungs every 4 (four) hours as needed for wheezing or shortness of breath.     atorvastatin (LIPITOR) 20 MG tablet Take 1 tablet (20 mg total) by mouth every morning. Qty: 90 tablet, Refills: 3    esomeprazole (NEXIUM) 40 MG capsule Take 40 mg by mouth daily.     Fluticasone-Salmeterol (ADVAIR) 250-50 MCG/DOSE AEPB Inhale 1 puff into the lungs every 12 (twelve) hours. Qty: 180 each, Refills: 3    folic acid (FOLVITE) 1 MG tablet TAKE ONE TABLET BY MOUTH ONCE DAILY IN THE EVENING Qty: 90 tablet, Refills: 0    furosemide (LASIX)  40 MG tablet Take 2 tablets (80 mg total) by mouth 2 (two) times daily.    isosorbide dinitrate (ISORDIL) 20 MG tablet Take 1 tablet (20 mg total) by mouth 2 (two) times daily. Qty: 180 tablet, Refills: 3    lactulose (CEPHULAC) 10 G packet Take 1 packet (10 g total) by mouth 2 (two) times daily between meals as needed (titrate to at least 2 BMs daily). Qty: 60 each, Refills: 0    methocarbamol (ROBAXIN) 500 MG tablet Take 1 tablet (500 mg total) by mouth every 6 (six) hours as needed for muscle spasms. Qty: 60 tablet, Refills: 0    metoCLOPramide (REGLAN) 10 MG tablet Take 1 tablet (10 mg total) by mouth 3 (three) times daily. Qty: 270 tablet, Refills: 3    OXYGEN Inhale 2 L into the lungs continuous.    polyethylene glycol (MIRALAX / GLYCOLAX) packet Take 17 g by mouth every 12 (twelve) hours as needed for moderate constipation or severe constipation. Qty: 14 each, Refills: 0    potassium chloride (KLOR-CON 10) 10 MEQ tablet Take 1 tablet (10 mEq total) by mouth daily. Qty: 30  tablet, Refills: 11    promethazine (PHENERGAN) 25 MG tablet Take 1 tablet (25 mg total) by mouth every 4 (four) hours as needed for nausea or vomiting. Qty: 120 tablet, Refills: 2    rifaximin (XIFAXAN) 550 MG TABS tablet Take 1 tablet (550 mg total) by mouth 2 (two) times daily. Qty: 60 tablet, Refills: 0    saccharomyces boulardii (FLORASTOR) 250 MG capsule Take 1 capsule (250 mg total) by mouth 2 (two) times daily. Qty: 60 capsule, Refills: 0      STOP taking these medications     diazepam (VALIUM) 5 MG tablet      insulin regular human CONCENTRATED (HUMULIN R) 500 UNIT/ML SOLN injection      INSULIN SYRINGE 1CC/29G (B-D INSULIN SYRINGE) 29G X 1/2" 1 ML MISC      losartan (COZAAR) 50 MG tablet      metolazone (ZAROXOLYN) 2.5 MG tablet      fluconazole (DIFLUCAN) 150 MG tablet        Allergies  Allergen Reactions  . Acetaminophen Other (See Comments)    Cirrhosis of liver  . Morphine  Other (See Comments)    REACTION: Lowers BP  . Morphine And Related Other (See Comments)    Blood pressure drops   . Other Other (See Comments)    AGENT:  Per pt, CANNOT TAKE ANY FORM OF BLOOD THINNER, due to cirrhosis of the liver  . Penicillins Anaphylaxis and Rash  . Trazodone And Nefazodone Other (See Comments)    Cardiac arrythmia - DO NOT USE  . Codeine Phosphate Other (See Comments)    REACTION: Stomach cramps  . Hydrocodone-Acetaminophen Other (See Comments)    REACTION: hallucinations  . Cephalexin Swelling and Rash  . Hydrocodone-Acetaminophen Other (See Comments)    unknown   Follow-up Information    Follow up with FRY,STEPHEN A, MD In 1 week.   Specialty:  Family Medicine   Contact information:   [place] [address] [phone]       Follow up with Erlene Quan, PA-C.   Specialty:  Cardiology   Why:  [facility] - 05/31/14 at 8:30am   Contact information:   [place] [address] [phone]        The results of significant diagnostics from this hospitalization (including imaging, microbiology, ancillary and laboratory) are listed below for reference.    Significant Diagnostic Studies: Ct Abdomen Pelvis Wo Contrast  05/16/2014   CLINICAL DATA:  Generalized weakness, shortness of breath  EXAM: CT ABDOMEN AND PELVIS WITHOUT CONTRAST  TECHNIQUE: Multidetector CT imaging of the abdomen and pelvis was performed following the standard protocol without IV contrast.  COMPARISON:  04/20/2014 radiograph 04/19/2014 CT  FINDINGS: Small right pleural effusion. Mild bibasilar airspace opacities, predominantly linear and lobular subpleural on the left partially imaged cardiac leads within the right atrium and ventricle.  Organ evaluation is limited in the absence of intravenous contrast. Within this limitation, cirrhotic liver morphology. Cholecystectomy. No intrahepatic biliary ductal dilatation mild extrahepatic biliary  ductal prominence is nonspecific.  Absent spleen. Nodular soft tissue density left upper quadrant presumably reflects residual splenules or regenerative splenic tissue. No appreciable abnormality of the pancreas. Bilateral adrenal gland nodularity without a dominant/measurable nodule. Symmetric renal size. Mild bilateral perinephric fat stranding. No hydroureteronephrosis.  Hartmann pouch. Left lower quadrant colostomy. The small bowel loops are of normal caliber. Anterior abdominal wall adhesions. Parastomal hernia containing loops of small bowel without evidence for obstruction or  incarceration.  Hysterectomy. 8.8 x 4.3 cm lobulated mass or complex fluid within the operative bed is similar to slightly increased from the prior. Thin walled bladder.  Advanced atherosclerotic disease of the abdominal aorta and branch vessels.  Diffuse anasarca.  Mild multilevel degenerative changes.  IMPRESSION: Left lower quadrant colostomy. Small bowel containing parastomal hernia. No bowel obstruction.  8.8 x 4.3 cm fluid collection or mass within the hysterectomy bed, similar to slightly increased since the prior. Per prior report, this is previously thought to be of ovarian origin.  Cirrhosis. Mild extrahepatic biliary ductal dilatation is nonspecific post cholecystectomy. Correlate with LFTs and ERCP if warranted.  Small right pleural effusion. Mild bibasilar airspace opacities; atelectasis and/or pneumonia. Small right pleural effusion.   Electronically Signed   By: Carlos Levering M.D.   On: 05/16/2014 05:39   Dg Chest 2 View  05/19/2014   CLINICAL DATA:  Shortness of breath and hypoxia  EXAM: CHEST  2 VIEW  COMPARISON:  Chest x-ray of May 15, 2014 and portable chest x-ray of March 04, 2014.  FINDINGS: The lungs are borderline hypoinflated. There are persistent coarse lung markings at the left lung base laterally. The interstitial markings elsewhere are mildly increased though stable. The cardiac silhouette is  enlarged. The central pulmonary vascularity is engorged and there is mild cephalization of the vascular pattern. The permanent pacemaker is unchanged in position. The bony thorax exhibits no acute abnormality.  IMPRESSION: CHF with mild pulmonary interstitial edema not significantly changed from the study of May 15, 2014. Persistent increased density in the left lower lobe posterior laterally is consistent with atelectasis or pneumonia. This finding is new since February of 2016.   Electronically Signed   By: David  Martinique M.D.   On: 05/19/2014 09:18   Dg Chest Portable 1 View  05/15/2014   CLINICAL DATA:  Shortness of breath  EXAM: PORTABLE CHEST - 1 VIEW  COMPARISON:  04/20/2014, 04/21/2013  FINDINGS: Hypoaeration with interstitial and vascular crowding. Central vascular congestion. Aortic atherosclerosis. Enlarged cardiac silhouette. Mild retrocardiac opacity is similar to prior and increased from 2015. Small left pleural effusion not excluded. Underpenetration and osteopenia limits osseous assessment. No overt interval change. Left chest wall battery pack with unchanged lead tip positions.  IMPRESSION: Central vascular congestion and interstitial prominence may reflect chronic change or atypical/viral infection versus pulmonary edema if acute.  Mild left lower lobe airspace opacity is nonspecific; atelectasis, scarring, or pneumonia. Recommend attention on follow-up with PA and lateral views in 3-4 weeks.   Electronically Signed   By: Carlos Levering M.D.   On: 05/15/2014 23:32    Microbiology: Recent Results (from the past 240 hour(s))  Culture, blood (routine x 2)     Status: None   Collection Time: 05/15/14 10:57 PM  Result Value Ref Range Status   Specimen Description BLOOD RIGHT HAND  Final   Special Requests BOTTLES DRAWN AEROBIC ONLY 2CC  Final   Culture   Final    NO GROWTH 5 DAYS Note: Culture results may be compromised due to an inadequate volume of blood received in culture  bottles. Performed at [facility]    Report Status 05/22/2014 FINAL  Final  Culture, blood (routine x 2)     Status: None   Collection Time: 05/16/14 12:20 AM  Result Value Ref Range Status   Specimen Description BLOOD RIGHT FOREARM  Final   Special Requests BOTTLES DRAWN AEROBIC AND ANAEROBIC B 3CC R 5CC  Final   Culture  Final    NO GROWTH 5 DAYS Performed at [facility]    Report Status 05/22/2014 FINAL  Final  MRSA PCR Screening     Status: None   Collection Time: 05/16/14  8:28 AM  Result Value Ref Range Status   MRSA by PCR NEGATIVE NEGATIVE Final    Comment:        The GeneXpert MRSA Assay (FDA approved for NASAL specimens only), is one component of a comprehensive MRSA colonization surveillance program. It is not intended to diagnose MRSA infection nor to guide or monitor treatment for MRSA infections.      Labs: Basic Metabolic Panel:  Recent Labs Lab 05/17/14 0337  05/19/14 0516 05/20/14 0345 05/21/14 0427 05/22/14 0410 05/23/14 0510  NA 131*  < > 129* 127* 128* 126* 126*  K 4.8  < > 4.9 4.6 4.2 4.4 4.2  CL 94*  < > 90* 86* 88* 86* 88*  CO2 22  < > '30 29 29 30 28  '$ GLUCOSE 261*  < > 126* 128* 129* 169* 151*  BUN 54*  < > 58* 48* 44* 47* 42*  CREATININE 1.67*  < > 1.30* 1.13* 1.13* 1.34* 1.28*  CALCIUM 9.7  < > 9.6 9.5 9.9 9.5 9.6  MG 1.8  --   --   --   --   --   --   < > = values in this interval not displayed. Liver Function Tests:  Recent Labs Lab 05/17/14 0337  AST 32  ALT 26  ALKPHOS 102  BILITOT 0.5  PROT 7.2  ALBUMIN 3.3*   No results for input(s): LIPASE, AMYLASE in the last 168 hours.  Recent Labs Lab 05/19/14 0516 05/20/14 0345 05/21/14 0427 05/22/14 0410 05/23/14 0510  AMMONIA 94* 62* 56* 51* 40*   CBC:  Recent Labs Lab 05/17/14 0337 05/19/14 0516 05/20/14 0345  WBC 11.4* 15.3* 12.8*  HGB 7.9* 8.5* 8.1*  HCT 23.6* 25.7* 24.7*  MCV 85.2 86.0 85.5  PLT 448* 486* 413*   Cardiac Enzymes: No  results for input(s): CKTOTAL, CKMB, CKMBINDEX, TROPONINI in the last 168 hours. BNP: BNP (last 3 results)  Recent Labs  04/12/14 0710 05/15/14 2322 05/21/14 0427  BNP 311.9* 918.6* 120.7*    ProBNP (last 3 results)  Recent Labs  07/16/13 0650 09/02/13 1803 03/30/14 1230  PROBNP 933.6* 558.0* 71.0    CBG:  Recent Labs Lab 05/22/14 1120 05/22/14 1636 05/22/14 2045 05/23/14 0602 05/23/14 1120  GLUCAP 169* 138* 176* 143* 169*       Signed:  Tessah Patchen  [facility] 05/23/2014, 1:04 PM

## 2014-05-24 NOTE — Telephone Encounter (Signed)
Pt is in a skill facility, spoke with pt she is recovering well. Patient contacted regarding discharge from [facility] on 05/23/14.  Patient understands to follow up with provider Arlester Marker on 05/31/14 at 8:30 AM at [address]. Patient understands discharge instructions? Yes Patient understands medications and regiment? Yes Patient understands to bring all medications to this visit?Yes  Pt states she is ben taken in the skill facility well.

## 2014-05-27 DIAGNOSIS — I509 Heart failure, unspecified: Secondary | ICD-10-CM | POA: Diagnosis not present

## 2014-05-30 ENCOUNTER — Ambulatory Visit: Payer: Medicare Other | Admitting: Family Medicine

## 2014-05-31 ENCOUNTER — Encounter: Payer: Medicare Other | Admitting: Cardiology

## 2014-06-01 ENCOUNTER — Encounter: Payer: Medicare Other | Admitting: *Deleted

## 2014-06-01 ENCOUNTER — Telehealth: Payer: Self-pay | Admitting: Cardiology

## 2014-06-01 NOTE — Telephone Encounter (Signed)
LMOVM reminding pt to send remote transmission.   

## 2014-06-07 ENCOUNTER — Encounter: Payer: Self-pay | Admitting: Cardiology

## 2014-06-14 ENCOUNTER — Telehealth: Payer: Self-pay | Admitting: Gastroenterology

## 2014-06-14 ENCOUNTER — Other Ambulatory Visit: Payer: Self-pay | Admitting: Internal Medicine

## 2014-06-14 MED ORDER — RIFAXIMIN 550 MG PO TABS: 550.0000 mg | ORAL_TABLET | Freq: Two times a day (BID) | ORAL | Status: AC

## 2014-06-14 NOTE — Telephone Encounter (Signed)
Informed patient that med was sent in to [facility]

## 2014-06-20 ENCOUNTER — Ambulatory Visit (INDEPENDENT_AMBULATORY_CARE_PROVIDER_SITE_OTHER): Payer: Medicare Other | Admitting: Physician Assistant

## 2014-06-20 ENCOUNTER — Telehealth: Payer: Self-pay | Admitting: Physician Assistant

## 2014-06-20 ENCOUNTER — Other Ambulatory Visit: Payer: Self-pay | Admitting: Physician Assistant

## 2014-06-20 ENCOUNTER — Encounter: Payer: Self-pay | Admitting: Physician Assistant

## 2014-06-20 VITALS — BP 140/45 | HR 60 | Ht 63.0 in | Wt 194.0 lb

## 2014-06-20 DIAGNOSIS — I10 Essential (primary) hypertension: Secondary | ICD-10-CM

## 2014-06-20 DIAGNOSIS — I5032 Chronic diastolic (congestive) heart failure: Secondary | ICD-10-CM

## 2014-06-20 DIAGNOSIS — N949 Unspecified condition associated with female genital organs and menstrual cycle: Secondary | ICD-10-CM

## 2014-06-20 DIAGNOSIS — N189 Chronic kidney disease, unspecified: Secondary | ICD-10-CM

## 2014-06-20 DIAGNOSIS — K7460 Unspecified cirrhosis of liver: Secondary | ICD-10-CM

## 2014-06-20 DIAGNOSIS — Z950 Presence of cardiac pacemaker: Secondary | ICD-10-CM

## 2014-06-20 DIAGNOSIS — E871 Hypo-osmolality and hyponatremia: Secondary | ICD-10-CM | POA: Diagnosis not present

## 2014-06-20 DIAGNOSIS — N9489 Other specified conditions associated with female genital organs and menstrual cycle: Secondary | ICD-10-CM

## 2014-06-20 DIAGNOSIS — J439 Emphysema, unspecified: Secondary | ICD-10-CM

## 2014-06-20 LAB — BASIC METABOLIC PANEL
BUN: 27 mg/dL — ABNORMAL HIGH (ref 6–23)
CALCIUM: 10.7 mg/dL — AB (ref 8.4–10.5)
CO2: 27 meq/L (ref 19–32)
Chloride: 85 mEq/L — ABNORMAL LOW (ref 96–112)
Creatinine, Ser: 1.25 mg/dL — ABNORMAL HIGH (ref 0.40–1.20)
GFR: 46.1 mL/min — ABNORMAL LOW (ref >60.00)
Glucose, Bld: 250 mg/dL — ABNORMAL HIGH (ref 70–99)
POTASSIUM: 4.6 meq/L (ref 3.5–5.1)
Sodium: 119 mEq/L — CL (ref 135–145)

## 2014-06-20 NOTE — Patient Instructions (Signed)
Medication Instructions:  Your physician recommends that you continue on your current medications as directed. Please refer to the Current Medication list given to you today.   Labwork: TODAY BMET  Testing/Procedures: NONE  Follow-Up: YOU HAVE A FOLLOW UP WITH DR. Lovena Le 07/21/14 @ 11:45  Any Other Special Instructions Will Be Listed Below (If Applicable).

## 2014-06-20 NOTE — Telephone Encounter (Signed)
Paged by [facility] lab regarding critical lab value of Na 119. Patient has h/o HF and on lasix '80mg'$  BID. She has chronic hyponatremia with Na around 126. Will recheck a lab in a week to reassess her hyponatremia.   Hilbert Corrigan PA Pager: [phone]

## 2014-06-20 NOTE — Progress Notes (Signed)
Cardiology Office Note   Date:  06/20/2014   ID:  [name], DOB [birth year], MRN [id number]  Patient Care Team: [name], MD as PCP - General Liliane Shi, PA-C as Physician Assistant (Cardiology) Everitt Amber, MD as Consulting Physician (Gynecologic Oncology) Evans Lance, MD as Consulting Physician (Cardiology) [name], MD as Consulting Physician (Gastroenterology) Philemon Kingdom, MD as Consulting Physician (Endocrinology) Kathee Delton, MD as Consulting Physician (Pulmonary Disease)    Chief Complaint  Patient presents with  . Congestive Heart Failure     History of Present Illness: [name] is a 64 y.o. female with a hx of symptomatic sinus node dysfunction s/p dual chamber PPM 08/2013, HTN, HL, DM, non-alcoholic cirrhosis, chronic anemia 2/2 to GI bleed, COPD on home O2, OSA, diastolic HF.  She had a perforated colon in the past during colonoscopy in the setting of diverticulitis requiring colectomy and colostomy. She has noted occasional blood in her colostomy bag for 3 years.    Admitted with Gram + bacteremia in 12/2013.  There was concern that her pacemaker lead may need to be extracted.  Her TEE was neg for vegetation.  She was therefore treated with antibiotics.    I saw her last in 03/2014.   A recent CT demonstrated an enlarged R adnexal mass.  She FU with PCP and transvaginal US demonstrated 5 cm R adnexal mass concerning for malignancy.  She was evaluated by GYN oncology in 3/16. Right ovarian mass was felt to be less likely malignancy. She was felt to be a poor surgical candidate. Follow-up ultrasound has been recommended in June 2016.  Admitted 4/17-4/25 with acute on chronic diastolic CHF in the setting of community acquired pneumonia complicated by acute kidney injury on chronic kidney disease stage III, worsening anemia, hyponatremia. She was diuresed.  Net diuresis was -24 L.  Echocardiogram continued to demonstrate normal LV function.   Creatinine improved prior to DC.  She did require transfusion with 1 unit PRBCs.   She returns for FU.  She is currently residing at [facility]. She is set to be discharged soon. She is doing much better. Breathing is overall stable. She remains on chronic O2. She denies chest discomfort. She denies syncope. She sleeping on 2 pillows. She denies PND. LE edema is much improved.   Studies/Reports Reviewed Today:  Echo 05/17/14 - Mildconcentric hypertrophy. EF 60% to 65%. Wall motion was normal; LV diastolic function parameters normal. - Aortic valve: Trileaflet; normal thickness leaflets. There was noregurgitation. - Aortic root: The aortic root was normal in size. - Mitral valve: Structurally normal valve. - Left atrium: The atrium was mildly dilated. - Right ventricle: Systolic function was normal. - Right atrium: The atrium was normal in size. - Tricuspid valve: There was mild regurgitation. - Pulmonic valve: Structurally normal valve. - PA peak pressure: 37 mm Hg (S). - Pericardium, extracardiac: A mild pericardial effusion wasidentified posterior to the heart. Features were not consistentwith tamponade physiology.  TE-Echocardiogram 01/06/14 - EF 55% to 60%. Wall motion was normal. - Left atrium: The atrium was mildly dilated. No LA or LAA clot - Atrial septum: There was a patent foramen ovale. - Trace MR and TR - No valvular vegetation  Nuclear Stress Test 06/2013 Low risk nuclear stress test. No evidence of ischemia.  Normal ejection fraction of 63%.  2D Echocardiogram 07/13/13 - Mild focal basal hypertrophy of the septum. EF 60% to 65%.  Wall motion was normal. Grade  2 diastolic dysfunction. - Left atrium: The atrium was moderately dilated. - Pericardium, extracardiac: A small to moderate pericardial effusion circumferential to the heart. No evidence of hemodynamic compromise.   Past Medical History  Diagnosis Date  . Cirrhosis   . GERD (gastroesophageal  reflux disease)   . Cervical disc syndrome     trouble turnng neck at times  . Chronic respiratory failure   . Diverticulitis of colon   . Perforation of colon   . IBS (irritable bowel syndrome)   . Chronic lower GI bleeding   . Overactive bladder   . Thrombocytopenia     sees Dr. Julien Nordmann   . Splenomegaly   . Depression   . Hypertension   . Asthma   . Heart murmur   . Hyperlipidemia   . COPD (chronic obstructive pulmonary disease)     sees Dr. Gwenette Greet   . Colostomy care   . Hypercalcemia   . CHF (congestive heart failure)   . Pacemaker 12/14/2013  . 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 Cervical cancer many years ago  . Complication of anesthesia     woke up during colonscopy and endoscopy in past  . History of blood transfusion "several"    "bleeding via ostomy" (01/04/2014)  . On home oxygen therapy     "2L; 24/7" (01/04/2014)  . Pneumonia "1-2 times"  . Chronic bronchitis "several times"  . Sleep apnea 2012    mild, no cpap needed  . Type II diabetes mellitus     sees Dr. Cruzita Lederer   . Chronic disease anemia     sees Dr. Julien Nordmann, due to chronic disease and GI losses   . History of stomach ulcers   . Migraine     "last one was several years ago" (01/04/2014)  . Arthritis     "tailbone; hands; legs" (01/04/2014)  . Anxiety   . Pericardial effusion 2007, 2015.   [name] Chronic abdominal pain     IBS  . Cirrhosis of liver     stage 4  . 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  Patient has mild copd.        Past Surgical History  Procedure Laterality Date  . Colostomy  11/06/2005  . Lung removal, partial Left 2004    upper lobe removed  . Left colectomy  11/06/2005    Hartmann resection of sigmoid colon and end colostomy.  . Esophagogastroduodenoscopy  03/19/2011    Procedure: ESOPHAGOGASTRODUODENOSCOPY (EGD);  Surgeon:  [name], MD;  Location: [facility];  Service: Endoscopy;  Laterality: N/A;  . Colonoscopy  03/19/2011    Procedure: COLONOSCOPY;  Surgeon: [name], MD;  Location: [facility];  Service: Endoscopy;  Laterality: N/A;  . Givens capsule study  03/20/2011    Procedure: GIVENS CAPSULE STUDY;  Surgeon: [name], MD;  Location: [facility];  Service: Endoscopy;  Laterality: N/A;  . Small bowel obstruction repair  March 2012  . Umbilical hernia repair  March 2012  . Splenectomy, total N/A 02/24/2013    Procedure: SPLENECTOMY;  Surgeon: Harl Bowie, MD;  Location: [place];  Service: General;  Laterality: N/A;  . Orif patella Left 04/21/2013    Procedure: OPEN REDUCTION INTERNAL (ORIF) FIXATION PATELLA;  Surgeon: Augustin Schooling, MD;  Location: [place];  Service: Orthopedics;  Laterality:  Left;  . Pacemaker insertion  2015  . Esophagogastroduodenoscopy (egd) with propofol N/A 11/02/2013    Procedure: ESOPHAGOGASTRODUODENOSCOPY (EGD) WITH PROPOFOL;  Surgeon: [name], MD;  Location: [facility];  Service: Endoscopy;  Laterality: N/A;  EGD with APC  . Hot hemostasis N/A 11/02/2013    Procedure: HOT HEMOSTASIS (ARGON PLASMA COAGULATION/BICAP);  Surgeon: [name], MD;  Location: [facility];  Service: Endoscopy;  Laterality: N/A;  . Colon surgery    . Hernia repair    . Fracture surgery    . Abdominal hysterectomy    . Tubal ligation    . Tonsillectomy  1959  . Appendectomy  1962  . Cholecystectomy    . Cardiac catheterization  1967  . Permanent pacemaker insertion N/A 09/07/2013    Procedure: PERMANENT PACEMAKER INSERTION;  Surgeon: Evans Lance, MD;  Location: [facility] C[facility] LAB;  Service: Cardiovascular;  Laterality: N/A;  . Tee without cardioversion N/A 01/06/2014    Procedure: TRANSESOPHAGEAL ECHOCARDIOGRAM (TEE);  Surgeon: Lelon Perla, MD;  Location: [facility]COPY;  Service: Cardiovascular;  Laterality: N/A;  . Colostomy  11/06/2005    OPERATIVE REPORT      Current Outpatient Prescriptions  Medication Sig Dispense Refill  . albuterol (PROVENTIL) (2.5 MG/3ML) 0.083% nebulizer solution Take 3 mLs (2.5 mg total) by nebulization every 4 (four) hours as needed for wheezing or shortness of breath. 75 mL 12  . albuterol-ipratropium (COMBIVENT) 18-103 MCG/ACT inhaler Inhale 2 puffs into the lungs every 4 (four) hours as needed for wheezing or shortness of breath.     [name] atorvastatin (LIPITOR) 20 MG tablet Take 1 tablet (20 mg total) by mouth every morning. 90 tablet 3  . dicyclomine (BENTYL) 10 MG capsule Take 1 capsule (10 mg total) by mouth 3 (three) times daily as needed for spasms.    [name] esomeprazole (NEXIUM) 40 MG capsule Take 40 mg by mouth daily.     . Fluticasone-Salmeterol (ADVAIR) 250-50 MCG/DOSE AEPB Inhale 1 puff into the lungs every 12 (twelve) hours. 831 each 3  . folic acid (FOLVITE) 1 MG tablet TAKE ONE TABLET BY MOUTH ONCE DAILY IN THE EVENING 90 tablet 0  . furosemide (LASIX) 40 MG tablet Take 2 tablets (80 mg total) by mouth 2 (two) times daily.    . hydrALAZINE (APRESOLINE) 25 MG tablet Take 1 tablet (25 mg total) by mouth 2 (two) times daily.    . insulin glargine (LANTUS) 100 UNIT/ML injection Inject 0.1 mLs (10 Units total) into the skin at bedtime.    . isosorbide dinitrate (ISORDIL) 20 MG tablet Take 1 tablet (20 mg total) by mouth 2 (two) times daily. 180 tablet 3  . lactulose (CEPHULAC) 10 G packet Take 1 packet (10 g total) by mouth 2 (two) times daily between meals as needed (titrate to at least 2 BMs daily). 60 each 0  . methocarbamol (ROBAXIN) 500 MG tablet Take 1 tablet (500 mg total) by mouth every 6 (six) hours as needed for muscle spasms. 60 tablet 0  . metoCLOPramide (REGLAN) 10 MG tablet Take 1 tablet (10 mg total) by mouth 3 (three) times daily. 270 tablet 3  . Oxycodone HCl 20 MG TABS Take 1 tablet (20 mg total) by mouth every 3 (three) hours as needed (pain). 30 tablet 0  . OXYGEN Inhale 2 L into the lungs  continuous.    . polyethylene glycol (MIRALAX / GLYCOLAX) packet Take 17 g by mouth every 12 (twelve) hours as needed for moderate constipation or  severe constipation. 14 each 0  . potassium chloride (KLOR-CON 10) 10 MEQ tablet Take 1 tablet (10 mEq total) by mouth daily. 30 tablet 11  . promethazine (PHENERGAN) 25 MG tablet Take 1 tablet (25 mg total) by mouth every 4 (four) hours as needed for nausea or vomiting. 120 tablet 2  . rifaximin (XIFAXAN) 550 MG TABS tablet Take 1 tablet (550 mg total) by mouth 2 (two) times daily. 60 tablet 3  . saccharomyces boulardii (FLORASTOR) 250 MG capsule Take 1 capsule (250 mg total) by mouth 2 (two) times daily. 60 capsule 0  . spironolactone (ALDACTONE) 25 MG tablet Take 0.5 tablets (12.5 mg total) by mouth 2 (two) times daily.     No current facility-administered medications for this visit.    Allergies:   Acetaminophen; Morphine; Morphine and related; Other; Penicillins; Trazodone and nefazodone; Codeine phosphate; Hydrocodone-acetaminophen; Cephalexin; and Hydrocodone-acetaminophen    Social History:  The patient  reports that she quit smoking about 12 years ago. Her smoking use included Cigarettes. She has a 70 pack-year smoking history. She has never used smokeless tobacco. She reports that she does not drink alcohol or use illicit drugs.   Family History:  The patient's family history includes Cervical cancer in her maternal grandmother; Cirrhosis in her father; Coronary artery disease in an other family member; Diabetes type II in her mother and another family member; Heart attack in her mother and sister; Hypertension in her daughter, father, sister, and son; Pancreatic cancer in her mother. There is no history of Stroke.    ROS:  Please see the history of present illness.    Review of Systems  Constitution: Positive for malaise/fatigue.  HENT: Positive for headaches.   Cardiovascular: Positive for chest pain, dyspnea on exertion, leg swelling,  orthopnea and paroxysmal nocturnal dyspnea.  Respiratory: Positive for cough.   Hematologic/Lymphatic: Positive for bleeding problem.  Musculoskeletal: Positive for back pain and joint pain.  Gastrointestinal: Positive for abdominal pain.  Neurological: Positive for loss of balance.  Psychiatric/Behavioral: The patient is nervous/anxious.   All other systems reviewed and are negative.    PHYSICAL EXAM: VS:  BP 140/45 mmHg  Pulse 60  Ht '5\' 3"'$  (1.6 m)  Wt 194 lb (87.998 kg)  BMI 34.37 kg/m2    Wt Readings from Last 3 Encounters:  06/20/14 194 lb (87.998 kg)  05/23/14 205 lb 6.4 oz (93.169 kg)  05/11/14 226 lb 6 oz (102.683 kg)     GEN: chronically ill appearing female, in no acute distress HEENT: normal Neck: I cannot appreciate JVD, no masses Cardiac:  Normal S1/S2, RRR; no murmur, no rubs or gallops, trace bilateral LE edema   Respiratory:  clear to auscultation bilaterally, no wheezing, rhonchi or rales. GI: soft, diffuse tenderness to palpation  MS: no deformity or atrophy; diffuse tenderness bilateral LE Skin: warm and dry  Neuro:  CNs II-XII intact  Psych: Flat affect   EKG:  EKG is ordered today.  It demonstrates:   Atrial paced, HR 60, no change from prior tracing   Recent Labs: 01/04/2014: TSH 0.412 03/30/2014: Pro B Natriuretic peptide (BNP) 71.0 05/17/2014: ALT 26; Magnesium 1.8 05/20/2014: Hemoglobin 8.1*; Platelets 413* 05/21/2014: B Natriuretic Peptide 120.7* 05/23/2014: BUN 42*; Creatinine 1.28*; Potassium 4.2; Sodium 126*    Recent Labs  05/20/14 0345 05/21/14 0427 05/22/14 0410 05/23/14 0510  K 4.6 4.2 4.4 4.2  BUN 48* 44* 47* 42*  CREATININE 1.13* 1.13* 1.34* 1.28*    Lipid Panel    Component  Value Date/Time   CHOL 173 10/05/2013 0939   TRIG 86.0 10/05/2013 0939   HDL 45.10 10/05/2013 0939   CHOLHDL 4 10/05/2013 0939   VLDL 17.2 10/05/2013 0939   LDLCALC 111* 10/05/2013 0939      ASSESSMENT AND PLAN:  Chronic diastolic CHF (congestive  heart failure):  Volume appears stable. Continue current therapy. Check repeat BMET today.  Hyponatremia:  Check BMET today.    CKD (chronic kidney disease), unspecified stage:  Check BMET today.     Pacemaker:  Follow up with EP as planned.    Essential hypertension:  Controlled.  Pulmonary emphysema, unspecified emphysema type:  Follow-up with pulmonology as planned.     Non-alcoholic cirrhosis:  Follow-up with gastroenterology as planned.  Adnexal mass:  Follow-up with GYN oncology as planned.         Current medicines are reviewed at length with the patient today.  Any concerns are as outlined above.  The following changes have been made:    None     Labs/ tests ordered today include:   Orders Placed This Encounter  Procedures  . Basic Metabolic Panel (BMET)  . EKG 12-Lead    Disposition:   FU me or Dr. Cristopher Peru in 1 month.    Signed, Versie Starks, MHS 06/20/2014 5:31 PM    [facility] [place], [place], [place]  [address] Phone: [phone]; Fax: [phone]

## 2014-06-21 ENCOUNTER — Other Ambulatory Visit: Payer: Self-pay

## 2014-06-21 ENCOUNTER — Telehealth: Payer: Self-pay | Admitting: *Deleted

## 2014-06-21 DIAGNOSIS — I5031 Acute diastolic (congestive) heart failure: Secondary | ICD-10-CM

## 2014-06-21 DIAGNOSIS — Z1231 Encounter for screening mammogram for malignant neoplasm of breast: Secondary | ICD-10-CM

## 2014-06-21 NOTE — Telephone Encounter (Signed)
Pt notified of lab results and to limit fluid intake to no more than 1200 cc's a day and will have repeat bmet 06/28/14 . Pt agreeable to plan of care.

## 2014-06-28 ENCOUNTER — Telehealth: Payer: Self-pay | Admitting: Family Medicine

## 2014-06-28 ENCOUNTER — Other Ambulatory Visit (INDEPENDENT_AMBULATORY_CARE_PROVIDER_SITE_OTHER): Payer: Medicare Other | Admitting: *Deleted

## 2014-06-28 ENCOUNTER — Telehealth: Payer: Self-pay | Admitting: *Deleted

## 2014-06-28 DIAGNOSIS — I5031 Acute diastolic (congestive) heart failure: Secondary | ICD-10-CM

## 2014-06-28 LAB — BASIC METABOLIC PANEL
BUN: 47 mg/dL — ABNORMAL HIGH (ref 6–23)
CO2: 21 meq/L (ref 19–32)
CREATININE: 1.36 mg/dL — AB (ref 0.40–1.20)
Calcium: 10.2 mg/dL (ref 8.4–10.5)
Chloride: 97 mEq/L (ref 96–112)
GFR: 41.82 mL/min — ABNORMAL LOW (ref >60.00)
GLUCOSE: 243 mg/dL — AB (ref 70–99)
POTASSIUM: 4.7 meq/L (ref 3.5–5.1)
SODIUM: 127 meq/L — AB (ref 135–145)

## 2014-06-28 NOTE — Telephone Encounter (Signed)
Noted Appointment scheduled for 06/29/14

## 2014-06-28 NOTE — Telephone Encounter (Signed)
Per [name] okay to give verbal orders. I spoke with Zacarias Pontes and gave verbal orders for PT.

## 2014-06-28 NOTE — Telephone Encounter (Signed)
[name] Patient Name: [name] DOB: 03-19-1950 Initial Comment caller states she just got out of the hospital and is in rehab - she has gained 5 lbs of fluid overnight - is taking lasix Nurse Assessment Nurse: Marcelline Deist, RN, Kermit Balo Date/Time (Eastern Time): 06/28/2014 9:18:01 AM Confirm and document reason for call. If symptomatic, describe symptoms. ---Caller states she just got out of the hospital and has finished rehab. She has gained 5 lbs of fluid overnight, is taking Lasix 40 mg twice a day. She was told weigh herself every day. She is also watching her fluid & sodium intake. She had so much fluid, ended up in CHF & pneumonia, the reason for hospitalization. The fluid is in her feet & ankles. She gets slightly SOB with exertion. Has the patient traveled out of the country within the last 30 days? ---Not Applicable Does the patient require triage? ---Yes Related visit to physician within the last 2 weeks? ---No Does the PT have any chronic conditions? (i.e. diabetes, asthma, etc.) ---Yes List chronic conditions. ---CHF, asthma, COPD, stage 4 cirrhosis of liver (non-drinking), pacemaker Guidelines Guideline Title Affirmed Question Affirmed Notes Heart Failure Post-Hospitalization Followup Call [1] MILD difficulty breathing (e.g., minimal/no SOB at rest, SOB with walking, pulse <100) AND [2] worse than when discharged from hospital Final Disposition User See Physician within [place], RN, Kermit Balo Comments Caller states she is trying to do everything she was told to do & is just anxious about things worsening as far as what put her in the hospital to begin with. Had a follow-up scheduled for next week, however nurse was able to schedule her for tomorrow morning as she needs transportation to the office. Caller also mentioned that she has diabetes & that her  blood glucose levels have been either on the low side or elevated. She will speak with her endocrinologist soon.

## 2014-06-28 NOTE — Telephone Encounter (Signed)
Lmptcb to go over lab results with verbal

## 2014-06-28 NOTE — Telephone Encounter (Signed)
Needs order for physical therapy 2/wk for 6 weeks Verbal is ok/ ok to leave message

## 2014-06-29 ENCOUNTER — Encounter: Payer: Self-pay | Admitting: Family Medicine

## 2014-06-29 ENCOUNTER — Ambulatory Visit (INDEPENDENT_AMBULATORY_CARE_PROVIDER_SITE_OTHER): Payer: Medicare Other | Admitting: Family Medicine

## 2014-06-29 VITALS — BP 119/50 | HR 61 | Temp 99.4°F | Ht 63.0 in | Wt 203.0 lb

## 2014-06-29 DIAGNOSIS — K745 Biliary cirrhosis, unspecified: Secondary | ICD-10-CM

## 2014-06-29 DIAGNOSIS — E1140 Type 2 diabetes mellitus with diabetic neuropathy, unspecified: Secondary | ICD-10-CM

## 2014-06-29 DIAGNOSIS — J209 Acute bronchitis, unspecified: Secondary | ICD-10-CM | POA: Diagnosis not present

## 2014-06-29 DIAGNOSIS — IMO0002 Reserved for concepts with insufficient information to code with codable children: Secondary | ICD-10-CM

## 2014-06-29 DIAGNOSIS — J438 Other emphysema: Secondary | ICD-10-CM

## 2014-06-29 DIAGNOSIS — I5033 Acute on chronic diastolic (congestive) heart failure: Secondary | ICD-10-CM

## 2014-06-29 DIAGNOSIS — E1165 Type 2 diabetes mellitus with hyperglycemia: Secondary | ICD-10-CM

## 2014-06-29 DIAGNOSIS — I10 Essential (primary) hypertension: Secondary | ICD-10-CM

## 2014-06-29 LAB — CBC WITH DIFFERENTIAL/PLATELET
Basophils Absolute: 0.1 10*3/uL (ref 0.0–0.1)
Basophils Relative: 0.5 % (ref 0.0–3.0)
EOS PCT: 5.4 % — AB (ref 0.0–5.0)
Eosinophils Absolute: 0.7 10*3/uL (ref 0.0–0.7)
HCT: 27.9 % — ABNORMAL LOW (ref 36.0–46.0)
Hemoglobin: 9.1 g/dL — ABNORMAL LOW (ref 12.0–15.0)
Lymphocytes Relative: 22.4 % (ref 12.0–46.0)
Lymphs Abs: 3 10*3/uL (ref 0.7–4.0)
MCHC: 32.6 g/dL (ref 30.0–36.0)
MCV: 82.5 fl (ref 78.0–100.0)
MONO ABS: 2.3 10*3/uL — AB (ref 0.1–1.0)
Monocytes Relative: 17.1 % — ABNORMAL HIGH (ref 3.0–12.0)
NEUTROS ABS: 7.4 10*3/uL (ref 1.4–7.7)
NEUTROS PCT: 54.6 % (ref 43.0–77.0)
Platelets: 412 10*3/uL — ABNORMAL HIGH (ref 150.0–400.0)
RBC: 3.39 Mil/uL — ABNORMAL LOW (ref 3.87–5.11)
RDW: 18.1 % — ABNORMAL HIGH (ref 11.5–15.5)
WBC: 13.5 10*3/uL — AB (ref 4.0–10.5)

## 2014-06-29 LAB — BASIC METABOLIC PANEL
BUN: 41 mg/dL — AB (ref 6–23)
CHLORIDE: 97 meq/L (ref 96–112)
CO2: 25 mEq/L (ref 19–32)
Calcium: 10.7 mg/dL — ABNORMAL HIGH (ref 8.4–10.5)
Creatinine, Ser: 1.3 mg/dL — ABNORMAL HIGH (ref 0.40–1.20)
GFR: 44.05 mL/min — AB (ref >60.00)
Glucose, Bld: 293 mg/dL — ABNORMAL HIGH (ref 70–99)
Potassium: 4.8 mEq/L (ref 3.5–5.1)
Sodium: 128 mEq/L — ABNORMAL LOW (ref 135–145)

## 2014-06-29 MED ORDER — AZITHROMYCIN 250 MG PO TABS: ORAL_TABLET | ORAL | Status: DC

## 2014-06-29 MED ORDER — OXYCODONE HCL 20 MG PO TABS: 20.0000 mg | ORAL_TABLET | ORAL | Status: AC | PRN

## 2014-06-29 MED ORDER — LACTULOSE 10 GM/15ML PO SOLN: 10.0000 g | Freq: Three times a day (TID) | ORAL | Status: AC

## 2014-06-29 MED ORDER — FUROSEMIDE 40 MG PO TABS: 80.0000 mg | ORAL_TABLET | Freq: Two times a day (BID) | ORAL | Status: DC

## 2014-06-29 MED ORDER — OXYCODONE HCL 20 MG PO TABS: 20.0000 mg | ORAL_TABLET | ORAL | Status: DC | PRN

## 2014-06-29 MED ORDER — SPIRONOLACTONE 25 MG PO TABS: 12.5000 mg | ORAL_TABLET | Freq: Two times a day (BID) | ORAL | Status: DC

## 2014-06-29 MED ORDER — HYDRALAZINE HCL 25 MG PO TABS: 25.0000 mg | ORAL_TABLET | Freq: Two times a day (BID) | ORAL | Status: AC

## 2014-06-29 NOTE — Progress Notes (Signed)
   Subjective:    Patient ID: [name], female    DOB: August 22, 1950, 64 y.o.   MRN: [id number]  HPI Here wit her daughter over concerns about increased swelling in her legs, weight gain, a dry cough, and some fever. The cough started yesterday. She went home from the adult day care last week, and she saw Nicki Reaper weaver in the Cardiology office last week. At that time she felt good and her edema was under good control. The family also has noticed some periods of decreased mental status and confusion at home and they ask to check her ammonia level.   Review of Systems  Constitutional: Positive for fever and fatigue.  Respiratory: Positive for cough and shortness of breath. Negative for wheezing.   Cardiovascular: Positive for leg swelling. Negative for chest pain and palpitations.  Psychiatric/Behavioral: Positive for confusion. Negative for hallucinations, behavioral problems and agitation.       Objective:   Physical Exam  Constitutional: She is oriented to person, place, and time.  Alert, walks with her walker  Cardiovascular: Normal rate, regular rhythm, normal heart sounds and intact distal pulses.   Pulmonary/Chest: Effort normal and breath sounds normal. No respiratory distress. She has no wheezes. She has no rales.  Musculoskeletal:  3+ edema in both lower legs   Neurological: She is alert and oriented to person, place, and time.          Assessment & Plan:  She has an early bronchitis so we will treat this with a Zpack. She had been on Spironolactone when she was DC'd from the hospital but this was left off her list when she came home from the rehab facility. I think this has caused the fluid buildup. We will start her back on this, also check a BMET today. Check an ammonia level today.

## 2014-06-29 NOTE — Progress Notes (Signed)
..  lb

## 2014-06-29 NOTE — Progress Notes (Signed)
Pre visit review using our clinic review tool, if applicable. No additional management support is needed unless otherwise documented below in the visit note. 

## 2014-06-30 ENCOUNTER — Telehealth: Payer: Self-pay | Admitting: Family Medicine

## 2014-06-30 LAB — AMMONIA: Ammonia: 80 umol/L — ABNORMAL HIGH (ref 16–53)

## 2014-06-30 NOTE — Telephone Encounter (Signed)
pt notified of lab results. Pt saw PCP 6/1 for diabetes f/u. She has appt 6/6 w/PCP and will get bmet then. Pt agreeable to plan of care.

## 2014-06-30 NOTE — Telephone Encounter (Signed)
I left a voice message for pt to return my call. I need to know if she wants to make the change and if so what pharmacy should I send in script?

## 2014-06-30 NOTE — Telephone Encounter (Signed)
Stop Advair and switch to Symbicort 160-4.5 to take 2 puffs bid. Call in #one with 11 rf

## 2014-06-30 NOTE — Telephone Encounter (Signed)
PA for Advair was denied.  Patient's plan requires patient to try and fail Symbicort for at least 30 days first.

## 2014-07-01 ENCOUNTER — Telehealth: Payer: Self-pay | Admitting: Family Medicine

## 2014-07-01 MED ORDER — BUDESONIDE-FORMOTEROL FUMARATE 160-4.5 MCG/ACT IN AERO: 2.0000 | INHALATION_SPRAY | Freq: Two times a day (BID) | RESPIRATORY_TRACT | Status: DC

## 2014-07-01 NOTE — Addendum Note (Signed)
Addended by: Aggie Hacker A on: 07/01/2014 09:06 AM   Modules accepted: Medications

## 2014-07-01 NOTE — Telephone Encounter (Signed)
I spoke with [name] and gave verbal order.

## 2014-07-01 NOTE — Telephone Encounter (Signed)
I spoke with pt and sent script e-scribe to [facility] on [address].

## 2014-07-01 NOTE — Telephone Encounter (Signed)
Consult a Education officer, museum as above

## 2014-07-01 NOTE — Telephone Encounter (Signed)
Patient submitted an appeal and it was approved.

## 2014-07-01 NOTE — Telephone Encounter (Signed)
Esther from [employer] called requesting a Environmental consultant for financial reasons. [name] did occupational therapy yesterday and it was an evaluation only. That means that she doesn't need Esther's services any longer. [name] needs a verbal order for Education officer, museum. For further questions please call her at [phone].

## 2014-07-04 ENCOUNTER — Ambulatory Visit (INDEPENDENT_AMBULATORY_CARE_PROVIDER_SITE_OTHER): Payer: Medicare Other | Admitting: Family Medicine

## 2014-07-04 ENCOUNTER — Encounter: Payer: Self-pay | Admitting: Family Medicine

## 2014-07-04 VITALS — BP 136/42 | HR 70 | Temp 98.8°F | Wt 208.4 lb

## 2014-07-04 DIAGNOSIS — K7460 Unspecified cirrhosis of liver: Secondary | ICD-10-CM | POA: Diagnosis not present

## 2014-07-04 DIAGNOSIS — J439 Emphysema, unspecified: Secondary | ICD-10-CM

## 2014-07-04 DIAGNOSIS — I10 Essential (primary) hypertension: Secondary | ICD-10-CM | POA: Diagnosis not present

## 2014-07-04 DIAGNOSIS — R609 Edema, unspecified: Secondary | ICD-10-CM | POA: Diagnosis not present

## 2014-07-04 MED ORDER — FUROSEMIDE 40 MG PO TABS: 120.0000 mg | ORAL_TABLET | Freq: Two times a day (BID) | ORAL | Status: AC

## 2014-07-04 NOTE — Progress Notes (Signed)
   Subjective:    Patient ID: [name], female    DOB: [birth year], 64 y.o.   MRN: [id number]  HPI Here to recheck fluid retention. When we saw her last week she had been gaining weight and was having more swelling in the legs and feet. We started her back on Spironolactone in addition to the Lasix 80 mg bid. We also increased her lactulose since her ammonia level had risen. She feels about the same but now her abdomen has been swelling as well. She has gained 5 lbs in te last week.    Review of Systems  Constitutional: Negative.   Respiratory: Negative.   Cardiovascular: Positive for leg swelling. Negative for chest pain and palpitations.       Objective:   Physical Exam  Constitutional: She appears well-developed and well-nourished.  Cardiovascular: Normal rate, regular rhythm, normal heart sounds and intact distal pulses.   Pulmonary/Chest: Effort normal and breath sounds normal.  Abdominal: She exhibits distension. There is no tenderness.  Musculoskeletal:  3+ edema in both legs   Lymphadenopathy:    She has no cervical adenopathy.          Assessment & Plan:  She is still retaining fluid despite the recent changes. We will increase the Lasix to 120 mg (3 pills) bid. Recheck one week

## 2014-07-04 NOTE — Progress Notes (Signed)
Pre visit review using our clinic review tool, if applicable. No additional management support is needed unless otherwise documented below in the visit note. 

## 2014-07-07 ENCOUNTER — Encounter: Payer: Self-pay | Admitting: Internal Medicine

## 2014-07-07 ENCOUNTER — Ambulatory Visit (INDEPENDENT_AMBULATORY_CARE_PROVIDER_SITE_OTHER): Payer: Medicare Other | Admitting: Internal Medicine

## 2014-07-07 VITALS — BP 118/50 | HR 65 | Ht 63.0 in | Wt 212.0 lb

## 2014-07-07 DIAGNOSIS — Z950 Presence of cardiac pacemaker: Secondary | ICD-10-CM

## 2014-07-07 DIAGNOSIS — R0602 Shortness of breath: Secondary | ICD-10-CM

## 2014-07-07 DIAGNOSIS — R001 Bradycardia, unspecified: Secondary | ICD-10-CM | POA: Diagnosis not present

## 2014-07-07 LAB — CUP PACEART INCLINIC DEVICE CHECK
Battery Remaining Longevity: 121.2 mo
Battery Voltage: 3.02 V
Brady Statistic RV Percent Paced: 1.5 %
Date Time Interrogation Session: 20160609145316
Lead Channel Impedance Value: 412.5 Ohm
Lead Channel Impedance Value: 512.5 Ohm
Lead Channel Pacing Threshold Amplitude: 0.75 V
Lead Channel Pacing Threshold Pulse Width: 0.4 ms
Lead Channel Sensing Intrinsic Amplitude: 10.2 mV
Lead Channel Setting Pacing Amplitude: 1.375
Lead Channel Setting Pacing Pulse Width: 0.4 ms
MDC IDC MSMT LEADCHNL RA PACING THRESHOLD AMPLITUDE: 0.375 V
MDC IDC MSMT LEADCHNL RA SENSING INTR AMPL: 3 mV
MDC IDC MSMT LEADCHNL RV PACING THRESHOLD PULSEWIDTH: 0.4 ms
MDC IDC PG SERIAL: 7648211
MDC IDC SET LEADCHNL RV PACING AMPLITUDE: 2.5 V
MDC IDC SET LEADCHNL RV SENSING SENSITIVITY: 2 mV
MDC IDC STAT BRADY RA PERCENT PACED: 76 %

## 2014-07-07 NOTE — Progress Notes (Signed)
HPI [name] returns today for follow-up. She is a very pleasant 64 year old woman with a history of symptomatic sinus node dysfunction, status post permanent pacemaker insertion, also with hypertension. No fever or chills. No syncope. She has been bothered by abdominal distention and peripheral edema. She has liver failure. She remains on chronic oxygen therapy. Allergies  Allergen Reactions  . Acetaminophen Other (See Comments)    Cirrhosis of liver  . Morphine Other (See Comments)    REACTION: Lowers BP  . Morphine And Related Other (See Comments)    Blood pressure drops   . Other Other (See Comments)    AGENT:  Per pt, CANNOT TAKE ANY FORM OF BLOOD THINNER, due to cirrhosis of the liver  . Penicillins Anaphylaxis and Rash  . Trazodone And Nefazodone Other (See Comments)    Cardiac arrythmia - DO NOT USE  . Codeine Phosphate Other (See Comments)    REACTION: Stomach cramps  . Hydrocodone-Acetaminophen Other (See Comments)    REACTION: hallucinations  . Cephalexin Swelling and Rash  . Hydrocodone-Acetaminophen Other (See Comments)    unknown     Current Outpatient Prescriptions  Medication Sig Dispense Refill  . albuterol (PROVENTIL) (2.5 MG/3ML) 0.083% nebulizer solution Take 3 mLs (2.5 mg total) by nebulization every 4 (four) hours as needed for wheezing or shortness of breath. 75 mL 12  . albuterol-ipratropium (COMBIVENT) 18-103 MCG/ACT inhaler Inhale 2 puffs into the lungs every 4 (four) hours as needed for wheezing or shortness of breath.     [name] atorvastatin (LIPITOR) 20 MG tablet Take 1 tablet (20 mg total) by mouth every morning. 90 tablet 3  . dicyclomine (BENTYL) 10 MG capsule Take 1 capsule (10 mg total) by mouth 3 (three) times daily as needed for spasms.    [name] esomeprazole (NEXIUM) 40 MG capsule Take 40 mg by mouth daily.     . Fluticasone-Salmeterol (ADVAIR) 250-50 MCG/DOSE AEPB Inhale 1 puff into the lungs every 12 (twelve) hours. 893 each 3  . folic acid  (FOLVITE) 1 MG tablet TAKE ONE TABLET BY MOUTH ONCE DAILY IN THE EVENING 90 tablet 0  . furosemide (LASIX) 40 MG tablet Take 3 tablets (120 mg total) by mouth 2 (two) times daily. 180 tablet 11  . HUMULIN R 500 UNIT/ML SOLN injection Inject into the skin as directed.     . hydrALAZINE (APRESOLINE) 25 MG tablet Take 1 tablet (25 mg total) by mouth 2 (two) times daily. 60 tablet 11  . isosorbide dinitrate (ISORDIL) 20 MG tablet Take 1 tablet (20 mg total) by mouth 2 (two) times daily. 180 tablet 3  . lactulose (CHRONULAC) 10 GM/15ML solution Take 15 mLs (10 g total) by mouth 3 (three) times daily. (Patient taking differently: Take 10 g by mouth 4 (four) times daily. ) 1892 mL 11  . methocarbamol (ROBAXIN) 500 MG tablet Take 1 tablet (500 mg total) by mouth every 6 (six) hours as needed for muscle spasms. 60 tablet 0  . metoCLOPramide (REGLAN) 10 MG tablet Take 1 tablet (10 mg total) by mouth 3 (three) times daily. 270 tablet 3  . Oxycodone HCl 20 MG TABS Take 1 tablet (20 mg total) by mouth every 3 (three) hours as needed (pain). 240 tablet 0  . OXYGEN Inhale 2 L into the lungs continuous.    . polyethylene glycol (MIRALAX / GLYCOLAX) packet Take 17 g by mouth every 12 (twelve) hours as needed for moderate constipation or severe constipation. 14 each 0  .  potassium chloride (KLOR-CON 10) 10 MEQ tablet Take 1 tablet (10 mEq total) by mouth daily. 30 tablet 11  . promethazine (PHENERGAN) 25 MG tablet Take 1 tablet (25 mg total) by mouth every 4 (four) hours as needed for nausea or vomiting. 120 tablet 2  . rifaximin (XIFAXAN) 550 MG TABS tablet Take 1 tablet (550 mg total) by mouth 2 (two) times daily. 60 tablet 3  . saccharomyces boulardii (FLORASTOR) 250 MG capsule Take 1 capsule (250 mg total) by mouth 2 (two) times daily. 60 capsule 0  . spironolactone (ALDACTONE) 25 MG tablet Take 0.5 tablets (12.5 mg total) by mouth 2 (two) times daily. 30 tablet 11   No current facility-administered medications  for this visit.     Past Medical History  Diagnosis Date  . Cirrhosis   . GERD (gastroesophageal reflux disease)   . Cervical disc syndrome     trouble turnng neck at times  . Chronic respiratory failure   . Diverticulitis of colon   . Perforation of colon   . IBS (irritable bowel syndrome)   . Chronic lower GI bleeding   . Overactive bladder   . Thrombocytopenia     sees Dr. Julien Nordmann   . Splenomegaly   . Depression   . Hypertension   . Asthma   . Heart murmur   . Hyperlipidemia   . COPD (chronic obstructive pulmonary disease)     sees Dr. Gwenette Greet   . Colostomy care   . Hypercalcemia   . CHF (congestive heart failure)   . Pacemaker 12/14/2013  . 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 Cervical cancer many years ago  . Complication of anesthesia     woke up during colonscopy and endoscopy in past  . History of blood transfusion "several"    "bleeding via ostomy" (01/04/2014)  . On home oxygen therapy     "2L; 24/7" (01/04/2014)  . Pneumonia "1-2 times"  . Chronic bronchitis "several times"  . Sleep apnea 2012    mild, no cpap needed  . Type II diabetes mellitus     sees Dr. Cruzita Lederer   . Chronic disease anemia     sees Dr. Julien Nordmann, due to chronic disease and GI losses   . History of stomach ulcers   . Migraine     "last one was several years ago" (01/04/2014)  . Arthritis     "tailbone; hands; legs" (01/04/2014)  . Anxiety   . Pericardial effusion 2007, 2015.   [name] Chronic abdominal pain     IBS  . Cirrhosis of liver     stage 4  . 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  Patient has mild copd.        ROS:   All systems reviewed and negative except as noted in the HPI.   Past Surgical History  Procedure Laterality Date  . Colostomy  11/06/2005  . Lung removal, partial Left 2004    upper lobe removed  . Left  colectomy  11/06/2005    Hartmann resection of sigmoid colon and end colostomy.  . Esophagogastroduodenoscopy  03/19/2011    Procedure: ESOPHAGOGASTRODUODENOSCOPY (EGD);  Surgeon: [name], MD;  Location: [facility];  Service: Endoscopy;  Laterality: N/A;  . Colonoscopy  03/19/2011    Procedure: COLONOSCOPY;  Surgeon: [name], MD;  Location:  [facility];  Service: Endoscopy;  Laterality: N/A;  . Givens capsule study  03/20/2011    Procedure: GIVENS CAPSULE STUDY;  Surgeon: [name], MD;  Location: [facility];  Service: Endoscopy;  Laterality: N/A;  . Small bowel obstruction repair  March 2012  . Umbilical hernia repair  March 2012  . Splenectomy, total N/A 02/24/2013    Procedure: SPLENECTOMY;  Surgeon: Harl Bowie, MD;  Location: [facility];  Service: General;  Laterality: N/A;  . Orif patella Left 04/21/2013    Procedure: OPEN REDUCTION INTERNAL (ORIF) FIXATION PATELLA;  Surgeon: Augustin Schooling, MD;  Location: [facility];  Service: Orthopedics;  Laterality: Left;  . Pacemaker insertion  2015  . Esophagogastroduodenoscopy (egd) with propofol N/A 11/02/2013    Procedure: ESOPHAGOGASTRODUODENOSCOPY (EGD) WITH PROPOFOL;  Surgeon: [name], MD;  Location: [facility];  Service: Endoscopy;  Laterality: N/A;  EGD with APC  . Hot hemostasis N/A 11/02/2013    Procedure: HOT HEMOSTASIS (ARGON PLASMA COAGULATION/BICAP);  Surgeon: [name], MD;  Location: [facility];  Service: Endoscopy;  Laterality: N/A;  . Colon surgery    . Hernia repair    . Fracture surgery    . Abdominal hysterectomy    . Tubal ligation    . Tonsillectomy  1959  . Appendectomy  1962  . Cholecystectomy    . Cardiac catheterization  1967  . Permanent pacemaker insertion N/A 09/07/2013    Procedure: PERMANENT PACEMAKER INSERTION;  Surgeon: Evans Lance, MD;  Location: [facility] CATH LAB;  Service: Cardiovascular;  Laterality: N/A;  . Tee without cardioversion N/A 01/06/2014    Procedure:  TRANSESOPHAGEAL ECHOCARDIOGRAM (TEE);  Surgeon: Lelon Perla, MD;  Location: [facility] ENDOSCOPY;  Service: Cardiovascular;  Laterality: N/A;  . Colostomy  11/06/2005    OPERATIVE REPORT     Family History  Problem Relation Age of Onset  . Coronary artery disease    . Diabetes type II    . Heart attack Mother   . Diabetes type II Mother   . Cirrhosis Father   . Heart attack Sister   . Pancreatic cancer Mother     secondary from surgery  . Cervical cancer Maternal Grandmother   . Hypertension Father   . Hypertension Sister   . Hypertension Son   . Hypertension Daughter   . Stroke Neg Hx      History   Social History  . Marital Status: Divorced    Spouse Name: N/A  . Number of Children: N/A  . Years of Education: N/A   Occupational History  . Disabled    Social History Main Topics  . Smoking status: Former Smoker -- 2.00 packs/day for 35 years    Types: Cigarettes    Quit date: 03/24/2002  . Smokeless tobacco: Never Used  . Alcohol Use: No  . Drug Use: No  . Sexual Activity: No   Other Topics Concern  . Not on file   Social History Narrative   Regular exercise: a little   Caffeine use: 2 cups of coffee in am           BP 118/50 mmHg  Pulse 65  Ht '5\' 3"'$  (1.6 m)  Wt 212 lb (96.163 kg)  BMI 37.56 kg/m2  SpO2 97%  Physical Exam:  Chronically ill appearing wearing nasal cannula, NAD HEENT: Unremarkable Neck:  7-8 cm JVD, no thyromegally Back:  No CVA tenderness Lungs:  Rales in the bases about 1/4 up bilaterally, no wheezes or rhonchi  HEART:  Regular rate rhythm, 2/6 systolic murmur, no rubs, no clicks Abd:  soft, positive bowel sounds, no organomegally, no rebound, no guarding Ext:  2 plus pulses, 3+ peripheral edema, no cyanosis, no clubbing Skin:  No rashes no nodules Neuro:  CN II through XII intact, motor grossly intact   DEVICE  Normal device function.  See PaceArt for details.   Assess/Plan:

## 2014-07-07 NOTE — Assessment & Plan Note (Signed)
Her [employer] DDD PM is working normally. Will recheck in several months.  

## 2014-07-07 NOTE — Assessment & Plan Note (Signed)
She is now pacing most of the time in the atrium.

## 2014-07-07 NOTE — Patient Instructions (Signed)
Medication Instructions:  Your physician recommends that you continue on your current medications as directed. Please refer to the Current Medication list given to you today.   Labwork: None ordered  Testing/Procedures: None ordered  Follow-Up: Your physician wants you to follow-up in: 12 months with Dr Knox Saliva will receive a reminder letter in the mail two months in advance. If you don't receive a letter, please call our office to schedule the follow-up appointment.  Remote monitoring is used to monitor your Pacemaker of ICD from home. This monitoring reduces the number of office visits required to check your device to one time per year. It allows [place] to keep an eye on the functioning of your device to ensure it is working properly. You are scheduled for a device check from home on 10/06/14. You may send your transmission at any time that day. If you have a wireless device, the transmission will be sent automatically. After your physician reviews your transmission, you will receive a postcard with your next transmission date.    Any Other Special Instructions Will Be Listed Below (If Applicable).

## 2014-07-07 NOTE — Assessment & Plan Note (Signed)
This problem is multifactorial. She has copd and abdominal distention which will affect her pulmonary mechanics. She is strongly encouraged to reduce her sodium intake.

## 2014-07-21 ENCOUNTER — Ambulatory Visit: Payer: Medicare Other | Admitting: Internal Medicine

## 2014-07-26 ENCOUNTER — Telehealth: Payer: Self-pay | Admitting: Family Medicine

## 2014-07-26 ENCOUNTER — Ambulatory Visit (INDEPENDENT_AMBULATORY_CARE_PROVIDER_SITE_OTHER): Payer: Medicare Other | Admitting: Family Medicine

## 2014-07-26 ENCOUNTER — Encounter: Payer: Self-pay | Admitting: Family Medicine

## 2014-07-26 ENCOUNTER — Other Ambulatory Visit: Payer: Self-pay

## 2014-07-26 ENCOUNTER — Other Ambulatory Visit: Payer: Self-pay | Admitting: *Deleted

## 2014-07-26 VITALS — BP 139/45 | HR 60 | Temp 98.2°F

## 2014-07-26 DIAGNOSIS — I10 Essential (primary) hypertension: Secondary | ICD-10-CM

## 2014-07-26 DIAGNOSIS — J439 Emphysema, unspecified: Secondary | ICD-10-CM

## 2014-07-26 DIAGNOSIS — J018 Other acute sinusitis: Secondary | ICD-10-CM | POA: Diagnosis not present

## 2014-07-26 DIAGNOSIS — K7469 Other cirrhosis of liver: Secondary | ICD-10-CM | POA: Diagnosis not present

## 2014-07-26 LAB — AMMONIA: Ammonia: 51 umol/L (ref 16–53)

## 2014-07-26 MED ORDER — METOCLOPRAMIDE HCL 10 MG PO TABS: 10.0000 mg | ORAL_TABLET | Freq: Three times a day (TID) | ORAL | Status: AC

## 2014-07-26 MED ORDER — ISOSORBIDE DINITRATE 20 MG PO TABS: 20.0000 mg | ORAL_TABLET | Freq: Two times a day (BID) | ORAL | Status: AC

## 2014-07-26 MED ORDER — AZITHROMYCIN 250 MG PO TABS: ORAL_TABLET | ORAL | Status: DC

## 2014-07-26 NOTE — Progress Notes (Signed)
Pre visit review using our clinic review tool, if applicable. No additional management support is needed unless otherwise documented below in the visit note. Pt was unsteady, did not weigh.

## 2014-07-26 NOTE — Progress Notes (Signed)
   Subjective:    Patient ID: [name], female    DOB: 08/02/1950, 64 y.o.   MRN: [id number]  HPI Here for 2 days of sinus pressure, PND, chest tightness, weakness, and coughing up green sputum. She was so weak this morning that she feel out of the bed onto the floor. No significant injuries, there was no LOC. She felt very SOB when she woke up but this improved throughout the day. When we found her ammonia level to be up to 80 a few weeks ago we asked her to increase her Lactulose to QID and she has done so.    Review of Systems  Constitutional: Positive for fatigue. Negative for fever, chills and diaphoresis.  HENT: Positive for congestion, postnasal drip and sinus pressure.   Eyes: Negative.   Respiratory: Positive for cough, chest tightness, shortness of breath and wheezing.   Cardiovascular: Negative.   Neurological: Negative.        Objective:   Physical Exam  Constitutional: She is oriented to person, place, and time. She appears well-developed and well-nourished.  HENT:  Right Ear: External ear normal.  Left Ear: External ear normal.  Nose: Nose normal.  Mouth/Throat: Oropharynx is clear and moist.  Eyes: Conjunctivae are normal.  Neck: No thyromegaly present.  Cardiovascular: Normal rate, regular rhythm, normal heart sounds and intact distal pulses.   Pulmonary/Chest: Effort normal and breath sounds normal. No respiratory distress. She has no wheezes. She has no rales.  Lymphadenopathy:    She has no cervical adenopathy.  Neurological: She is alert and oriented to person, place, and time.          Assessment & Plan:  She seems to have a sinusitis so we will treat her with a Zpack. Check labs today. Her COPD seems to be stable. We need to see if her ammonia level is coming down.

## 2014-07-26 NOTE — Telephone Encounter (Signed)
Patient has appointment to see MD Sarajane Jews today at [facility]

## 2014-07-26 NOTE — Telephone Encounter (Signed)
Patient Name: [name]  DOB: 1950/03/05    Initial Comment Caller states, Starr with [facility], patient is coughing up plegm, hoarse, sore throat, has COPD, began having shortness of breath last night, using oxygen at 3 instead of normal 2   Nurse Assessment  Nurse: Julien Girt, RN, Almyra Free Date/Time Eilene Ghazi Time): 07/26/2014 11:32:19 AM  Confirm and document reason for call. If symptomatic, describe symptoms. ---Caller states she Starr with [facility], this patient is coughing up mucus hoarse, she has a sore throat, COPD with increased shortness of breath throught out the night and has increased her nasal O2 from 2- 2.5 lpm. O2 sat is 97%. No fever.  Has the patient traveled out of the country within the last 30 days? ---Not Applicable  Does the patient require triage? ---Yes  Related visit to physician within the last 2 weeks? ---N/A  Does the PT have any chronic conditions? (i.e. diabetes, asthma, etc.) ---Yes  List chronic conditions. ---COPD, Htn, Type 2 Diabetes, Anemia, Cirrhosis of the Liver     Guidelines    Guideline Title Affirmed Question Affirmed Notes  Cough - Chronic [1] Increasing difficulty breathing AND [2] always has some difficulty breathing    Final Disposition User   Go to ED Now (or PCP triage) Julien Girt, RN, Almyra Free    Comments  Patient refuses ED at this time, she is asking for an appt instead. Advised that I will fax this note and notify the nurse and she can expect a call back.

## 2014-07-27 LAB — BASIC METABOLIC PANEL
BUN: 68 mg/dL — ABNORMAL HIGH (ref 6–23)
CHLORIDE: 102 meq/L (ref 96–112)
CO2: 24 mEq/L (ref 19–32)
Calcium: 11.9 mg/dL — ABNORMAL HIGH (ref 8.4–10.5)
Creatinine, Ser: 1.28 mg/dL — ABNORMAL HIGH (ref 0.40–1.20)
GFR: 44.84 mL/min — ABNORMAL LOW (ref >60.00)
Glucose, Bld: 180 mg/dL — ABNORMAL HIGH (ref 70–99)
POTASSIUM: 5.4 meq/L — AB (ref 3.5–5.1)
Sodium: 133 mEq/L — ABNORMAL LOW (ref 135–145)

## 2014-07-27 LAB — CBC WITH DIFFERENTIAL/PLATELET
Basophils Absolute: 0 10*3/uL (ref 0.0–0.1)
Basophils Relative: 0.3 % (ref 0.0–3.0)
Eosinophils Absolute: 0.3 10*3/uL (ref 0.0–0.7)
Eosinophils Relative: 2.2 % (ref 0.0–5.0)
HEMATOCRIT: 24 % — AB (ref 36.0–46.0)
Hemoglobin: 7.8 g/dL — CL (ref 12.0–15.0)
Lymphocytes Relative: 29.3 % (ref 12.0–46.0)
Lymphs Abs: 3.4 10*3/uL (ref 0.7–4.0)
MCHC: 32.2 g/dL (ref 30.0–36.0)
MCV: 80.2 fl (ref 78.0–100.0)
MONOS PCT: 17.1 % — AB (ref 3.0–12.0)
Monocytes Absolute: 2 10*3/uL — ABNORMAL HIGH (ref 0.1–1.0)
NEUTROS ABS: 5.9 10*3/uL (ref 1.4–7.7)
NEUTROS PCT: 51.1 % (ref 43.0–77.0)
Platelets: 420 10*3/uL — ABNORMAL HIGH (ref 150.0–400.0)
RBC: 3 Mil/uL — ABNORMAL LOW (ref 3.87–5.11)
RDW: 18.3 % — ABNORMAL HIGH (ref 11.5–15.5)
WBC: 11.6 10*3/uL — ABNORMAL HIGH (ref 4.0–10.5)

## 2014-07-27 LAB — HEPATIC FUNCTION PANEL
ALT: 30 U/L (ref 0–35)
AST: 31 U/L (ref 0–37)
Albumin: 3.9 g/dL (ref 3.5–5.2)
Alkaline Phosphatase: 93 U/L (ref 39–117)
BILIRUBIN DIRECT: 0 mg/dL (ref 0.0–0.3)
BILIRUBIN TOTAL: 0.3 mg/dL (ref 0.2–1.2)
TOTAL PROTEIN: 7.9 g/dL (ref 6.0–8.3)

## 2014-08-03 ENCOUNTER — Other Ambulatory Visit: Payer: Self-pay | Admitting: Family Medicine

## 2014-08-03 MED ORDER — POLYETHYLENE GLYCOL 3350 17 GM/SCOOP PO POWD: 17.0000 g | Freq: Two times a day (BID) | ORAL | Status: AC | PRN

## 2014-08-03 NOTE — Telephone Encounter (Signed)
Refill request for Miralax and send to [facility].

## 2014-08-03 NOTE — Telephone Encounter (Signed)
Per [name] okay to refill and I did send script e-scribe.

## 2014-08-04 ENCOUNTER — Telehealth: Payer: Self-pay | Admitting: Family Medicine

## 2014-08-04 NOTE — Telephone Encounter (Signed)
Mickel Baas from [facility] is requesting to continue Physical therapy for pt  twice a wk for 2 wks and would like rx for seated rollator walker fax to [phone]

## 2014-08-08 NOTE — Telephone Encounter (Signed)
I spoke with Mickel Baas, gave verbal order for PT and faxed script for walker to below number.

## 2014-08-08 NOTE — Telephone Encounter (Signed)
Please give them the Okay to continue PT. The rx was written for a walker

## 2014-08-09 ENCOUNTER — Telehealth: Payer: Self-pay | Admitting: Family Medicine

## 2014-08-09 ENCOUNTER — Telehealth: Payer: Self-pay | Admitting: *Deleted

## 2014-08-09 MED ORDER — HUMULIN R U-500 (CONCENTRATED) 500 UNIT/ML ~~LOC~~ SOLN: SUBCUTANEOUS | Status: DC

## 2014-08-09 MED ORDER — DOXYCYCLINE HYCLATE 100 MG PO TABS: 100.0000 mg | ORAL_TABLET | Freq: Two times a day (BID) | ORAL | Status: DC

## 2014-08-09 NOTE — Telephone Encounter (Signed)
Refill sent.

## 2014-08-09 NOTE — Telephone Encounter (Signed)
Call in Doxycycline 100 mg bid for 10 days  

## 2014-08-09 NOTE — Telephone Encounter (Signed)
I sent script e-scribe and spoke with pt. 

## 2014-08-09 NOTE — Telephone Encounter (Signed)
Please advise 

## 2014-08-09 NOTE — Telephone Encounter (Signed)
Pt needs refill on humulin  Please send to [facility] on [address]   [phone]

## 2014-08-09 NOTE — Telephone Encounter (Signed)
[facility] - Client [place] Call Center  Patient Name: [name]  DOB: 07-02-1950    Initial Comment Caller states keeps having low grade fever and feeling sick, went into the office 2 weeks ago and was given ABX    Nurse Assessment  Nurse: Wynetta Emery, RN, Baker Janus Date/Time ([place] Time): 08/09/2014 8:58:41 AM  Confirm and document reason for call. If symptomatic, describe symptoms. ---[name] is having a low grade fever, abd and head hurts does not feel well wants to know if she can have antibiotics called in will make appt with MD next week and make arrangements for transportation.  Has the patient traveled out of the country within the last 30 days? ---No  Does the patient require triage? ---Yes  Related visit to physician within the last 2 weeks? ---No  Does the PT have any chronic conditions? (i.e. diabetes, asthma, etc.) ---Unknown     Guidelines    Guideline Title Affirmed Question Affirmed Notes  Sinus Pain or Congestion [1] SEVERE headache AND [2] fever    Final Disposition User   Go to ED Now (or PCP triage) Wynetta Emery, RN, Baker Janus    Comments  ATTN: has sinus pain pressure, abd pain headache low grade fever can't take NSAIDs/tylenol d/t cirrohosis of liver. requesting z pak called and will make appt for next week since transportation has to be done in advance.  ATTN: also, has new pharmacy she wants medications to go to. listed above. please call and let her know if medication will be called in.   Disagree/Comply: Comply

## 2014-08-17 ENCOUNTER — Telehealth: Payer: Self-pay | Admitting: Internal Medicine

## 2014-08-17 NOTE — Telephone Encounter (Signed)
pt called to sched appt...done....pt ok and ware of new d.t

## 2014-08-23 ENCOUNTER — Encounter: Payer: Self-pay | Admitting: Family Medicine

## 2014-08-23 ENCOUNTER — Ambulatory Visit (INDEPENDENT_AMBULATORY_CARE_PROVIDER_SITE_OTHER): Payer: Medicare Other | Admitting: Family Medicine

## 2014-08-23 VITALS — BP 136/52 | HR 66 | Temp 97.7°F | Ht 63.0 in | Wt 201.0 lb

## 2014-08-23 DIAGNOSIS — K7682 Hepatic encephalopathy: Secondary | ICD-10-CM

## 2014-08-23 DIAGNOSIS — M25562 Pain in left knee: Secondary | ICD-10-CM | POA: Diagnosis not present

## 2014-08-23 DIAGNOSIS — K7290 Hepatic failure, unspecified without coma: Secondary | ICD-10-CM

## 2014-08-23 DIAGNOSIS — J439 Emphysema, unspecified: Secondary | ICD-10-CM

## 2014-08-23 DIAGNOSIS — E1140 Type 2 diabetes mellitus with diabetic neuropathy, unspecified: Secondary | ICD-10-CM

## 2014-08-23 DIAGNOSIS — IMO0002 Reserved for concepts with insufficient information to code with codable children: Secondary | ICD-10-CM

## 2014-08-23 DIAGNOSIS — E1165 Type 2 diabetes mellitus with hyperglycemia: Secondary | ICD-10-CM

## 2014-08-23 DIAGNOSIS — I10 Essential (primary) hypertension: Secondary | ICD-10-CM

## 2014-08-23 DIAGNOSIS — D500 Iron deficiency anemia secondary to blood loss (chronic): Secondary | ICD-10-CM

## 2014-08-23 DIAGNOSIS — I5031 Acute diastolic (congestive) heart failure: Secondary | ICD-10-CM

## 2014-08-23 DIAGNOSIS — F331 Major depressive disorder, recurrent, moderate: Secondary | ICD-10-CM

## 2014-08-23 LAB — BASIC METABOLIC PANEL
BUN: 47 mg/dL — AB (ref 6–23)
CALCIUM: 11.2 mg/dL — AB (ref 8.4–10.5)
CO2: 24 mEq/L (ref 19–32)
Chloride: 103 mEq/L (ref 96–112)
Creatinine, Ser: 1.14 mg/dL (ref 0.40–1.20)
GFR: 51.24 mL/min — AB (ref >60.00)
Glucose, Bld: 159 mg/dL — ABNORMAL HIGH (ref 70–99)
Potassium: 4.8 mEq/L (ref 3.5–5.1)
Sodium: 135 mEq/L (ref 135–145)

## 2014-08-23 LAB — CBC WITH DIFFERENTIAL/PLATELET
Basophils Absolute: 0 10*3/uL (ref 0.0–0.1)
Basophils Relative: 0.2 % (ref 0.0–3.0)
Eosinophils Absolute: 0.7 10*3/uL (ref 0.0–0.7)
Eosinophils Relative: 4.5 % (ref 0.0–5.0)
HCT: 26.2 % — ABNORMAL LOW (ref 36.0–46.0)
LYMPHS PCT: 25.4 % (ref 12.0–46.0)
Lymphs Abs: 3.8 10*3/uL (ref 0.7–4.0)
MCHC: 32 g/dL (ref 30.0–36.0)
MCV: 79.2 fl (ref 78.0–100.0)
Monocytes Absolute: 2.6 10*3/uL — ABNORMAL HIGH (ref 0.1–1.0)
Monocytes Relative: 17.1 % — ABNORMAL HIGH (ref 3.0–12.0)
Neutro Abs: 7.9 10*3/uL — ABNORMAL HIGH (ref 1.4–7.7)
Neutrophils Relative %: 52.8 % (ref 43.0–77.0)
PLATELETS: 392 10*3/uL (ref 150.0–400.0)
RBC: 3.31 Mil/uL — ABNORMAL LOW (ref 3.87–5.11)
RDW: 17.7 % — AB (ref 11.5–15.5)
WBC: 14.9 10*3/uL — ABNORMAL HIGH (ref 4.0–10.5)

## 2014-08-23 NOTE — Progress Notes (Signed)
Pre visit review using our clinic review tool, if applicable. No additional management support is needed unless otherwise documented below in the visit note. 

## 2014-08-23 NOTE — Progress Notes (Signed)
   Subjective:    Patient ID: [name], female    DOB: 1950-06-04, 64 y.o.   MRN: [id number]  HPI Here to follow up many issues. Her BP and diabetes have been stable. We did labs at her visit here last month and her anemia was stable with a Hgb of 7.8. Her renal function was stable with a creatinine of 1.28. She feels very weak and has had trouble walking around, even with her walker. She fell yesterday in her home when she felt dizzy, and she feel on both knees. Of course she had fractured the left patella in March 2015. Since the fall yesterday the left knee has been very painful.    Review of Systems  Constitutional: Positive for fatigue. Negative for fever.  Respiratory: Positive for shortness of breath and wheezing. Negative for cough.   Cardiovascular: Positive for leg swelling. Negative for chest pain and palpitations.  Gastrointestinal: Negative.   Neurological: Positive for dizziness. Negative for tremors, seizures, syncope, facial asymmetry, speech difficulty, numbness and headaches.       Objective:   Physical Exam  Constitutional: She is oriented to person, place, and time.  Very weak but alert  Neck: No thyromegaly present.  Cardiovascular: Normal rate, regular rhythm, normal heart sounds and intact distal pulses.   Pulmonary/Chest: Effort normal. No respiratory distress. She has no rales.  Soft scattered wheezes   Musculoskeletal:  In pain, the left knee including the patella is very tender and swollen. ROM is reduced   Lymphadenopathy:    She has no cervical adenopathy.  Neurological: She is alert and oriented to person, place, and time.          Assessment & Plan:  Her TN is stable. The diabetes seems to be stable. We will get a CBC today to check the anemia. Get a BMET to check her renal status. She has injured the left knee so she can use Oxycodone for pain. I will have her see Dr. Veverly Fells ASAP.

## 2014-08-29 ENCOUNTER — Telehealth: Payer: Self-pay | Admitting: Family Medicine

## 2014-08-29 NOTE — Telephone Encounter (Signed)
Millie from [facility] call to ask for a verbal order for fup visit for [facility]    [phone]

## 2014-08-29 NOTE — Telephone Encounter (Signed)
Per [name] okay and I did speak with Millie, gave verbal for below order.

## 2014-08-30 ENCOUNTER — Telehealth: Payer: Self-pay | Admitting: Family Medicine

## 2014-08-30 NOTE — Telephone Encounter (Signed)
Per [name] okay to give verbal orders for PT, script was wrote for manual wheelchair and faxed to below number.

## 2014-08-30 NOTE — Telephone Encounter (Signed)
Lori from [facility] call to say they need orders for physical therapy for  2 times a week for 8 weeks . Also is requesting a script for a manuel wheel chair  Fax to [phone]  She said can be verbal orders can leave a message.    [phone]

## 2014-08-31 IMAGING — CR DG ABDOMEN ACUTE W/ 1V CHEST
4 series · 4 of 4 positions shown · non-contrast
Comparison: DG PELVIS 1-2 VIEWS dated 04/21/2013; DG CHEST 2 VIEW
dated 04/21/2013

CLINICAL DATA: GI bleed with bloody output and colostomy bag.

EXAM:
ACUTE ABDOMEN SERIES (ABDOMEN 2 VIEW & CHEST 1 VIEW)

[w chest pa]
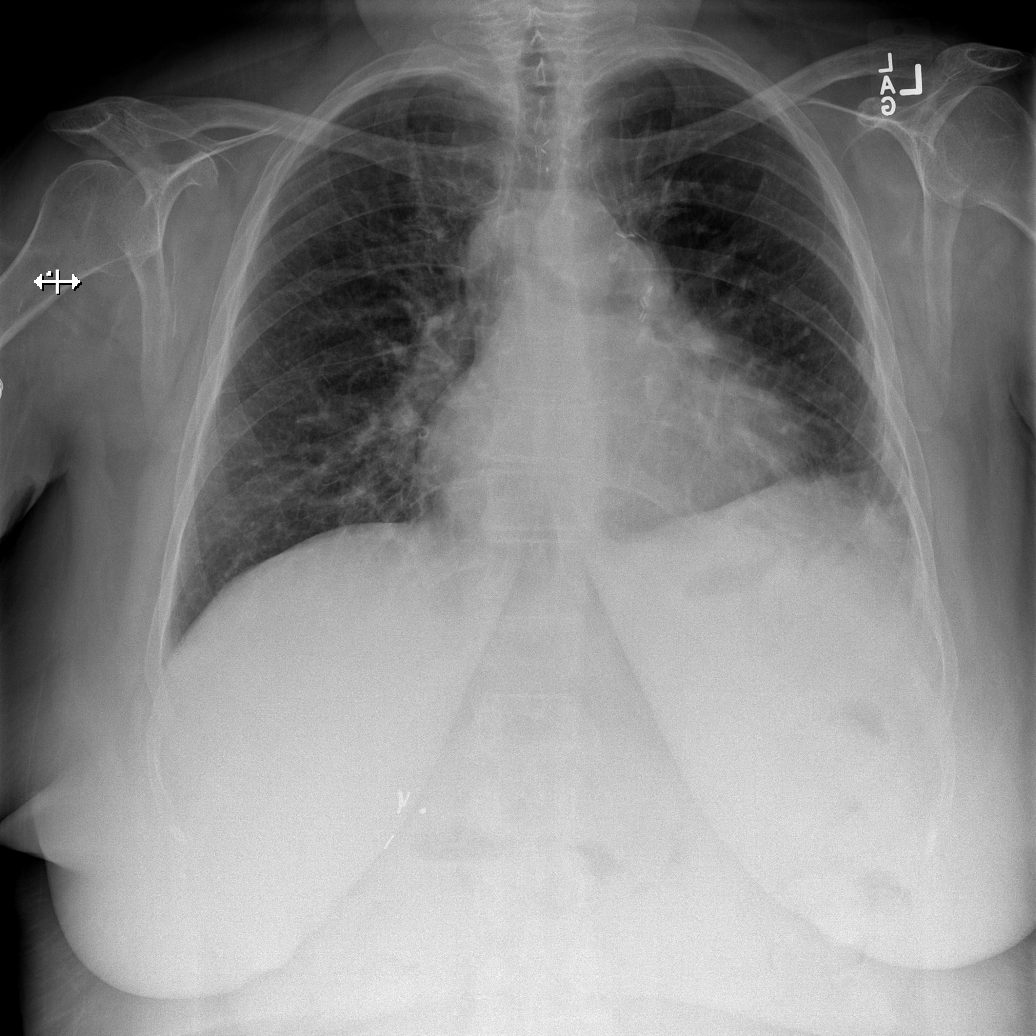

[w abdomen upright]
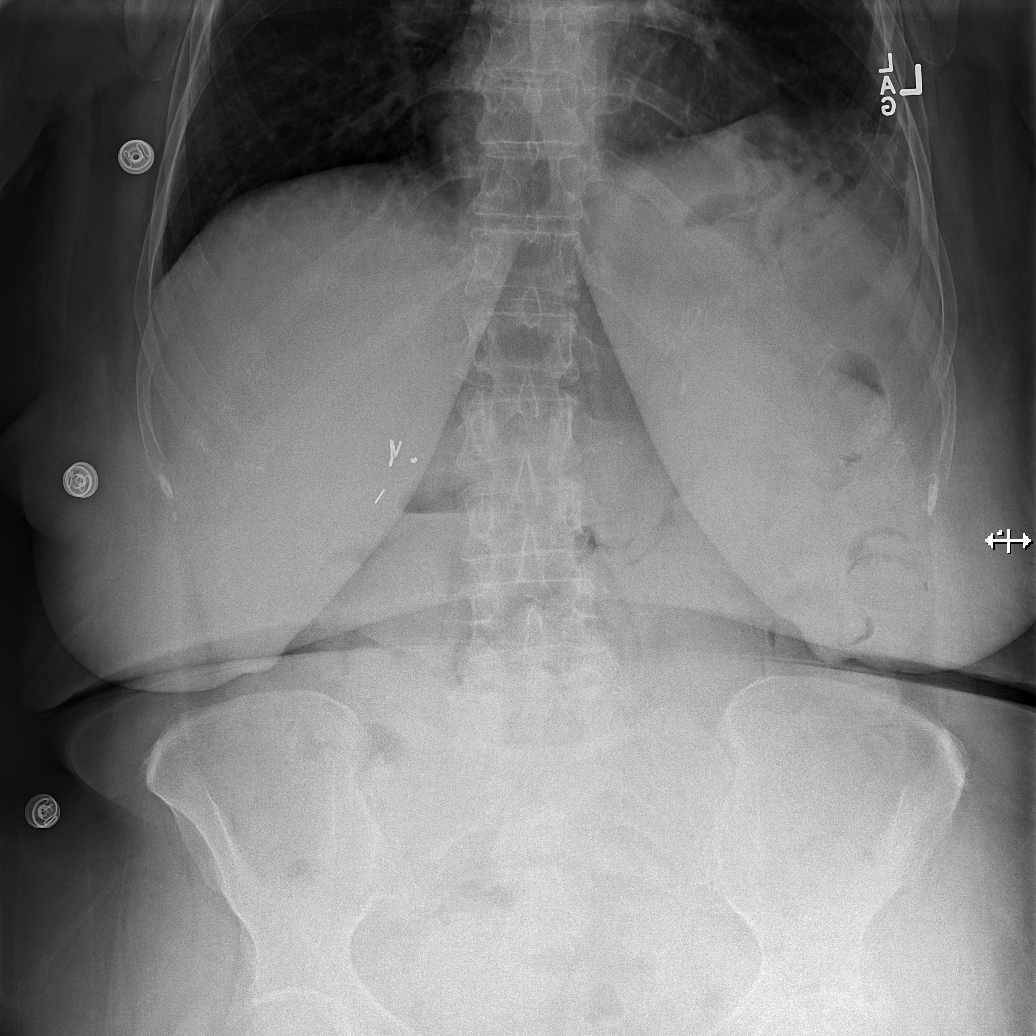

[t abdomen supine (1 of 2)]
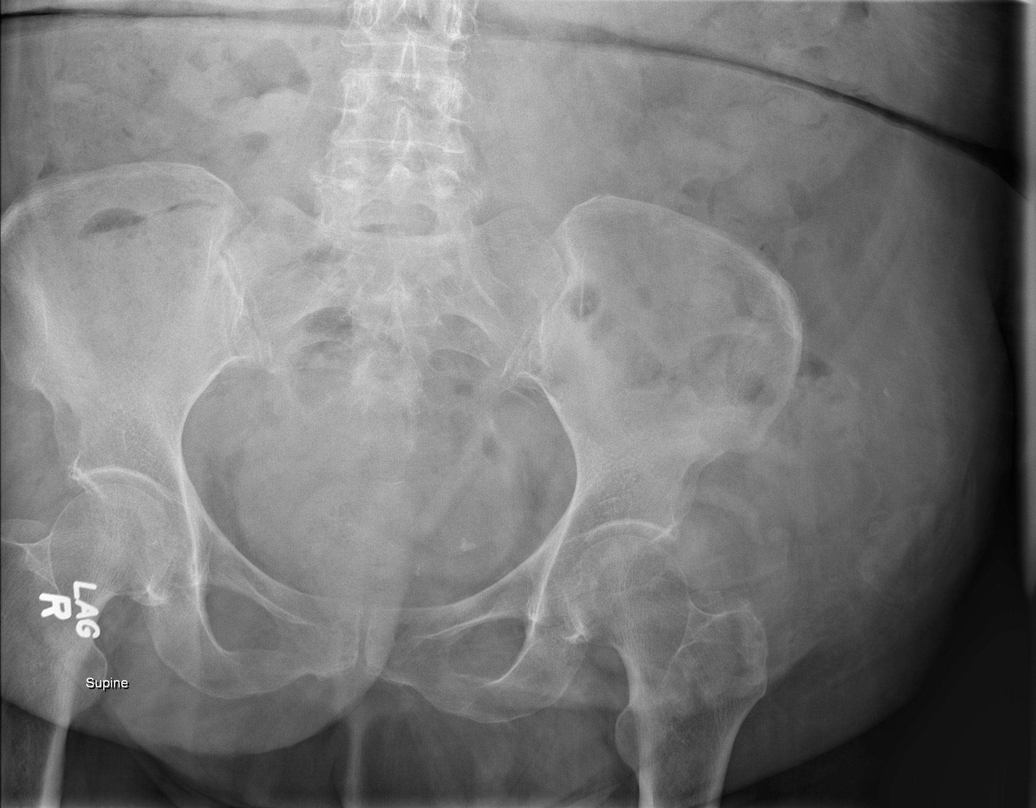

[t abdomen supine (2 of 2)]
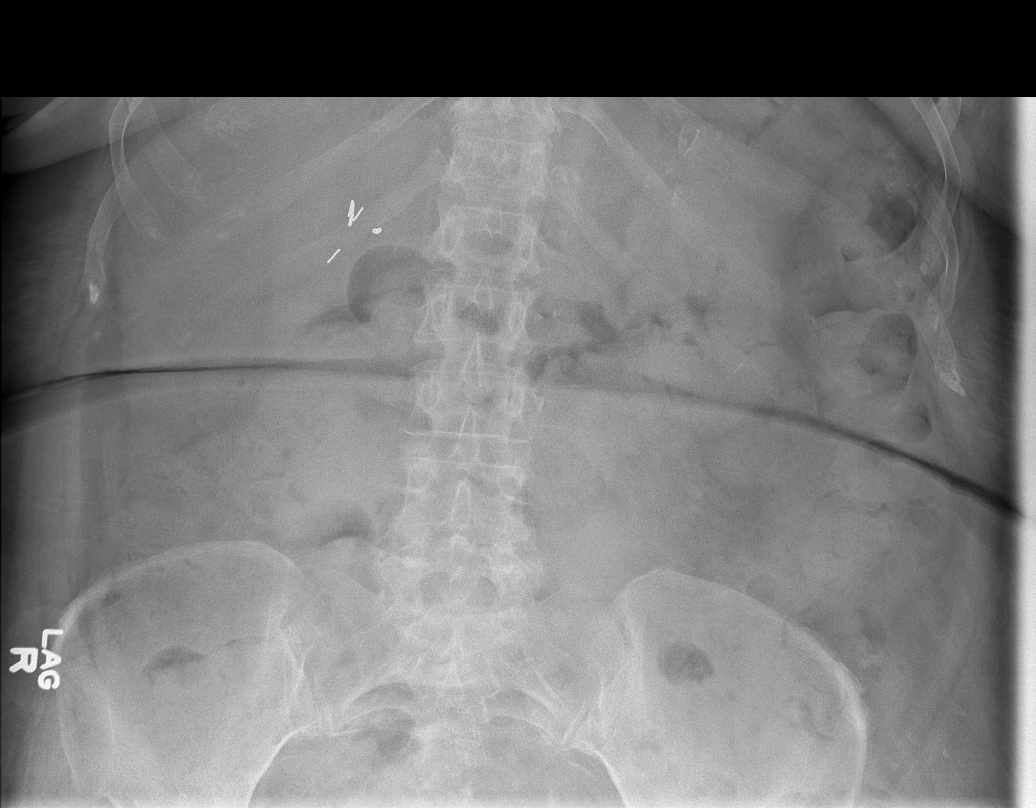

[4 of 4 positions shown; findings below may reference images not displayed]

FINDINGS: Shallow inspiration with elevation of left hemidiaphragm. Heart size
and pulmonary vascularity are normal for technique. Interstitial
fibrosis in the lungs. Postoperative changes in the mediastinum. No
change since prior chest.

Surgical clips in the right upper quadrant. Stool-filled colon. No
small or large bowel distention. No radiopaque stones identified.
Degenerative changes in the spine.
IMPRESSION: Negative abdominal radiographs.  No acute cardiopulmonary disease.

## 2014-09-01 NOTE — Progress Notes (Signed)
   Subjective:    Patient ID: [name], female    DOB: 09/21/1950, 64 y.o.   MRN: [id number]  HPI    Review of Systems     Objective:   Physical Exam        Assessment & Plan:  This patient has a mobility limitation that significantly impairs her ability to participate in many activities of daily living such as toileting, feeding, dressing, and bathing in customary locations in the home, and this deficit cannot be resolved adequately with using a cane or walker, and her home provides adequate access between rooms for a wheelchair.

## 2014-09-02 ENCOUNTER — Telehealth: Payer: Self-pay | Admitting: Internal Medicine

## 2014-09-02 NOTE — Telephone Encounter (Signed)
returned call and s.w. pt and confirmed appt....pt ok and aware °

## 2014-09-07 ENCOUNTER — Telehealth: Payer: Self-pay | Admitting: Family Medicine

## 2014-09-07 NOTE — Telephone Encounter (Signed)
I spoke with pt and she has enough medication to last about 5 days. I explained that [name] is out of the office until 09/12/14, will forward this note. Pt will call us back if she finds the script.

## 2014-09-07 NOTE — Telephone Encounter (Signed)
Patient called for RX refill on oxycodone.

## 2014-09-07 NOTE — Telephone Encounter (Signed)
Pt called back and she found script.

## 2014-09-08 ENCOUNTER — Other Ambulatory Visit: Payer: Self-pay | Admitting: Internal Medicine

## 2014-09-08 ENCOUNTER — Ambulatory Visit (HOSPITAL_BASED_OUTPATIENT_CLINIC_OR_DEPARTMENT_OTHER): Payer: Medicare Other

## 2014-09-08 ENCOUNTER — Ambulatory Visit (HOSPITAL_BASED_OUTPATIENT_CLINIC_OR_DEPARTMENT_OTHER): Payer: Medicare Other | Admitting: Internal Medicine

## 2014-09-08 ENCOUNTER — Encounter: Payer: Self-pay | Admitting: Internal Medicine

## 2014-09-08 ENCOUNTER — Telehealth: Payer: Self-pay | Admitting: *Deleted

## 2014-09-08 ENCOUNTER — Telehealth: Payer: Self-pay | Admitting: Internal Medicine

## 2014-09-08 VITALS — BP 173/35 | HR 64 | Temp 99.2°F | Resp 17 | Ht 63.0 in | Wt 201.5 lb

## 2014-09-08 DIAGNOSIS — Z85118 Personal history of other malignant neoplasm of bronchus and lung: Secondary | ICD-10-CM

## 2014-09-08 DIAGNOSIS — D509 Iron deficiency anemia, unspecified: Secondary | ICD-10-CM | POA: Diagnosis present

## 2014-09-08 LAB — CBC WITH DIFFERENTIAL/PLATELET
BASO%: 0.3 % (ref 0.0–2.0)
Basophils Absolute: 0.1 10*3/uL (ref 0.0–0.1)
EOS ABS: 0.3 10*3/uL (ref 0.0–0.5)
EOS%: 1.9 % (ref 0.0–7.0)
HEMATOCRIT: 23.3 % — AB (ref 34.8–46.6)
HGB: 7.4 g/dL — ABNORMAL LOW (ref 11.6–15.9)
LYMPH%: 20.1 % (ref 14.0–49.7)
MCH: 24.5 pg — ABNORMAL LOW (ref 25.1–34.0)
MCHC: 31.8 g/dL (ref 31.5–36.0)
MCV: 77.2 fL — AB (ref 79.5–101.0)
MONO#: 2.9 10*3/uL — ABNORMAL HIGH (ref 0.1–0.9)
MONO%: 19.5 % — AB (ref 0.0–14.0)
NEUT#: 8.7 10*3/uL — ABNORMAL HIGH (ref 1.5–6.5)
NEUT%: 58.2 % (ref 38.4–76.8)
PLATELETS: 472 10*3/uL — AB (ref 145–400)
RBC: 3.02 10*6/uL — ABNORMAL LOW (ref 3.70–5.45)
RDW: 16.5 % — ABNORMAL HIGH (ref 11.2–14.5)
WBC: 15 10*3/uL — ABNORMAL HIGH (ref 3.9–10.3)
lymph#: 3 10*3/uL (ref 0.9–3.3)

## 2014-09-08 LAB — IRON AND TIBC CHCC
%SAT: 4 % — ABNORMAL LOW (ref 21–57)
IRON: 18 ug/dL — AB (ref 41–142)
TIBC: 450 ug/dL — ABNORMAL HIGH (ref 236–444)
UIBC: 432 ug/dL — AB (ref 120–384)

## 2014-09-08 LAB — FERRITIN CHCC: Ferritin: 16 ng/ml (ref 9–269)

## 2014-09-08 LAB — TECHNOLOGIST REVIEW

## 2014-09-08 NOTE — Telephone Encounter (Signed)
-----   Message from Curt Bears, MD sent at 09/08/2014  3:55 PM EDT ----- Call patient with the result and we will arrange for Feraheme infusion next week

## 2014-09-08 NOTE — Telephone Encounter (Signed)
per pof to sch tp appt-gave pt copy of avs °

## 2014-09-08 NOTE — Progress Notes (Signed)
Quick Note:  Call patient with the result and we will arrange for Feraheme infusion next week ______

## 2014-09-08 NOTE — Progress Notes (Signed)
[name] [phone]   [phone]  OFFICE PROGRESS NOTE  [name], MD [place] [address]  DIAGNOSIS:  1) stage IB non-small cell lung cancer diagnosed in April 2004 status post left upper lobectomy by Dr. Roxan Hockey.  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  3) anemia of chronic disease plus/minus deficiency secondary to GI bleed.  4) hypercalcemia most likely secondary to excessive vitamin D intake.  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 Ninfa Linden.  CURRENT THERAPY:  Feraheme infusion 510 mg IV weekly x 2 doses. First dose today 12/16/2013.  INTERVAL HISTORY: [name] 64 y.o. female returns to the clinic today for followup visit. She continues to have increasing fatigue. She also has intermittent bleeding from the ostomy. She has been in and out of the hospital for the last 2 months for congestive heart failure, pneumonia as well as deconditioning. She also is present several weeks at a skilled nursing facility. She missed her previous appointment for the lab work and visit. She denied and specifically no nausea or vomiting, no fever or chills. She denied having any significant weight loss or night sweats. She has no chest pain but continues to have shortness of breath with exertion. She has no other bleeding, bruises or ecchymosis.   MEDICAL HISTORY: Past Medical History  Diagnosis Date  . Cirrhosis   . GERD (gastroesophageal reflux disease)   . Cervical disc syndrome     trouble turnng neck at times  . Chronic respiratory failure   . Diverticulitis of colon   . Perforation of colon   . IBS (irritable bowel syndrome)   . Chronic lower GI bleeding   . Overactive bladder   . Thrombocytopenia     sees Dr. Julien Nordmann   . Splenomegaly   . Depression   . Hypertension   . Asthma   . Heart murmur   . Hyperlipidemia   . COPD (chronic  obstructive pulmonary disease)     sees Dr. Gwenette Greet   . Colostomy care   . Hypercalcemia   . CHF (congestive heart failure)   . Pacemaker 12/14/2013  . 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 Cervical cancer many years ago  . Complication of anesthesia     woke up during colonscopy and endoscopy in past  . History of blood transfusion "several"    "bleeding via ostomy" (01/04/2014)  . On home oxygen therapy     "2L; 24/7" (01/04/2014)  . Pneumonia "1-2 times"  . Chronic bronchitis "several times"  . Sleep apnea 2012    mild, no cpap needed  . Type II diabetes mellitus     sees Dr. Cruzita Lederer   . Chronic disease anemia     sees Dr. Julien Nordmann, due to chronic disease and GI losses   . History of stomach ulcers   . Migraine     "last one was several years ago" (01/04/2014)  . Arthritis     "tailbone; hands; legs" (01/04/2014)  . Anxiety   . Pericardial effusion 2007, 2015.   [name] Chronic abdominal pain     IBS  . Cirrhosis of liver     stage 4  . 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  Patient has mild copd.  ALLERGIES:  is allergic to acetaminophen; morphine; morphine and related; other; penicillins; trazodone and nefazodone; codeine phosphate; hydrocodone-acetaminophen; cephalexin; and hydrocodone-acetaminophen.  MEDICATIONS:  Current Outpatient Prescriptions  Medication Sig Dispense Refill  . albuterol (PROVENTIL) (2.5 MG/3ML) 0.083% nebulizer solution Take 3 mLs (2.5 mg total) by nebulization every 4 (four) hours as needed for wheezing or shortness of breath. 75 mL 12  . albuterol-ipratropium (COMBIVENT) 18-103 MCG/ACT inhaler Inhale 2 puffs into the lungs every 4 (four) hours as needed for wheezing or shortness of breath.     [name] atorvastatin (LIPITOR) 20 MG tablet Take 1 tablet (20 mg total) by mouth every morning. 90 tablet 3  .  azithromycin (ZITHROMAX) 250 MG tablet As directed 6 tablet 0  . diazepam (VALIUM) 5 MG tablet     . dicyclomine (BENTYL) 10 MG capsule Take 1 capsule (10 mg total) by mouth 3 (three) times daily as needed for spasms.    [name] doxycycline (VIBRA-TABS) 100 MG tablet Take 1 tablet (100 mg total) by mouth 2 (two) times daily. 20 tablet 0  . esomeprazole (NEXIUM) 40 MG capsule Take 40 mg by mouth daily.     . Fluticasone-Salmeterol (ADVAIR) 250-50 MCG/DOSE AEPB Inhale 1 puff into the lungs every 12 (twelve) hours. 237 each 3  . folic acid (FOLVITE) 1 MG tablet TAKE ONE TABLET BY MOUTH ONCE DAILY IN THE EVENING 90 tablet 0  . furosemide (LASIX) 40 MG tablet Take 3 tablets (120 mg total) by mouth 2 (two) times daily. 180 tablet 11  . HUMULIN R 500 UNIT/ML injection Inject 33 units before breakfast, 33 units before lunch and 20 units before dinner daily as instructed. 20 mL 2  . hydrALAZINE (APRESOLINE) 25 MG tablet Take 1 tablet (25 mg total) by mouth 2 (two) times daily. 60 tablet 11  . isosorbide dinitrate (ISORDIL) 20 MG tablet Take 1 tablet (20 mg total) by mouth 2 (two) times daily. 180 tablet 3  . lactulose (CHRONULAC) 10 GM/15ML solution Take 15 mLs (10 g total) by mouth 3 (three) times daily. (Patient taking differently: Take 10 g by mouth 4 (four) times daily. ) 1892 mL 11  . losartan (COZAAR) 50 MG tablet     . methocarbamol (ROBAXIN) 500 MG tablet Take 1 tablet (500 mg total) by mouth every 6 (six) hours as needed for muscle spasms. 60 tablet 0  . metoCLOPramide (REGLAN) 10 MG tablet Take 1 tablet (10 mg total) by mouth 3 (three) times daily. 270 tablet 1  . Oxycodone HCl 20 MG TABS Take 1 tablet (20 mg total) by mouth every 3 (three) hours as needed (pain). 240 tablet 0  . OXYGEN Inhale 2 L into the lungs continuous.    . polyethylene glycol powder (GLYCOLAX/MIRALAX) powder Take 17 g by mouth 2 (two) times daily as needed. 3350 g 3  . potassium chloride (KLOR-CON 10) 10 MEQ tablet Take 1 tablet  (10 mEq total) by mouth daily. 30 tablet 11  . promethazine (PHENERGAN) 25 MG tablet Take 1 tablet (25 mg total) by mouth every 4 (four) hours as needed for nausea or vomiting. 120 tablet 2  . RELION INSULIN SYR .3CC/29G 29G X 1/2" 0.3 ML MISC     . rifaximin (XIFAXAN) 550 MG TABS tablet Take 1 tablet (550 mg total) by mouth 2 (two) times daily. 60 tablet 3  . saccharomyces boulardii (FLORASTOR) 250 MG capsule Take 1 capsule (250 mg total) by mouth 2 (two) times daily. 60 capsule 0  . spironolactone (ALDACTONE)  25 MG tablet Take 0.5 tablets (12.5 mg total) by mouth 2 (two) times daily. 30 tablet 11   No current facility-administered medications for this visit.    SURGICAL HISTORY:  Past Surgical History  Procedure Laterality Date  . Colostomy  11/06/2005  . Lung removal, partial Left 2004    upper lobe removed  . Left colectomy  11/06/2005    Hartmann resection of sigmoid colon and end colostomy.  . Esophagogastroduodenoscopy  03/19/2011    Procedure: ESOPHAGOGASTRODUODENOSCOPY (EGD);  Surgeon: [name], MD;  Location: [facility];  Service: Endoscopy;  Laterality: N/A;  . Colonoscopy  03/19/2011    Procedure: COLONOSCOPY;  Surgeon: [name], MD;  Location: [facility];  Service: Endoscopy;  Laterality: N/A;  . Givens capsule study  03/20/2011    Procedure: GIVENS CAPSULE STUDY;  Surgeon: [name], MD;  Location: [facility];  Service: Endoscopy;  Laterality: N/A;  . Small bowel obstruction repair  March 2012  . Umbilical hernia repair  March 2012  . Splenectomy, total N/A 02/24/2013    Procedure: SPLENECTOMY;  Surgeon: Harl Bowie, MD;  Location: [place];  Service: General;  Laterality: N/A;  . Orif patella Left 04/21/2013    Procedure: OPEN REDUCTION INTERNAL (ORIF) FIXATION PATELLA;  Surgeon: Augustin Schooling, MD;  Location: [place];  Service: Orthopedics;  Laterality: Left;  . Pacemaker insertion  2015  . Esophagogastroduodenoscopy (egd) with propofol N/A  11/02/2013    Procedure: ESOPHAGOGASTRODUODENOSCOPY (EGD) WITH PROPOFOL;  Surgeon: [name], MD;  Location: [facility];  Service: Endoscopy;  Laterality: N/A;  EGD with APC  . Hot hemostasis N/A 11/02/2013    Procedure: HOT HEMOSTASIS (ARGON PLASMA COAGULATION/BICAP);  Surgeon: [name], MD;  Location: [facility];  Service: Endoscopy;  Laterality: N/A;  . Colon surgery    . Hernia repair    . Fracture surgery    . Abdominal hysterectomy    . Tubal ligation    . Tonsillectomy  1959  . Appendectomy  1962  . Cholecystectomy    . Cardiac catheterization  1967  . Permanent pacemaker insertion N/A 09/07/2013    Procedure: PERMANENT PACEMAKER INSERTION;  Surgeon: Evans Lance, MD;  Location: [facility] CATH LAB;  Service: Cardiovascular;  Laterality: N/A;  . Tee without cardioversion N/A 01/06/2014    Procedure: TRANSESOPHAGEAL ECHOCARDIOGRAM (TEE);  Surgeon: Lelon Perla, MD;  Location: [facility] ENDOSCOPY;  Service: Cardiovascular;  Laterality: N/A;  . Colostomy  11/06/2005    OPERATIVE REPORT    REVIEW OF SYSTEMS:  Constitutional: positive for fatigue Eyes: negative Ears, nose, mouth, throat, and face: negative Respiratory: positive for dyspnea on exertion Cardiovascular: negative Gastrointestinal: positive for Bleeding from the ostomy site. Genitourinary:negative Integument/breast: negative Hematologic/lymphatic: Anemia and low platelets Musculoskeletal:negative Neurological: negative Behavioral/Psych: negative Allergic/Immunologic: negative   PHYSICAL EXAMINATION: General appearance: alert, cooperative, fatigued and no distress Head: Normocephalic, without obvious abnormality, atraumatic Neck: no adenopathy and no JVD Lymph nodes: Cervical, supraclavicular, and axillary nodes normal. Resp: clear to auscultation bilaterally Cardio: regular rate and rhythm, S1, S2 normal, no murmur, click, rub or gallop GI: soft, non-tender; bowel sounds normal; no masses,  no  organomegaly Extremities: extremities normal, atraumatic, no cyanosis or edema Neurologic: Alert and oriented X 3, normal strength and tone. Normal symmetric reflexes. Normal coordination and gait  ECOG PERFORMANCE STATUS: 2 - Symptomatic, <50% confined to bed  Blood pressure 173/35, pulse 64, temperature 99.2 F (37.3 C), temperature source Oral, resp. rate 17, height '5\' 3"'$  (  1.6 m), weight 201 lb 8 oz (91.4 kg), SpO2 97 %.  LABORATORY DATA: Lab Results  Component Value Date   WBC 14.9* 08/23/2014   HGB 8.4 Repeated and verified X2.* 08/23/2014   HCT 26.2 Repeated and verified X2.* 08/23/2014   MCV 79.2 08/23/2014   PLT 392.0 08/23/2014      Chemistry      Component Value Date/Time   NA 135 08/23/2014 1154   NA 131* 05/13/2014 1132   K 4.8 08/23/2014 1154   K 5.2* 05/13/2014 1132   CL 103 08/23/2014 1154   CL 103 03/23/2012 0938   CO2 24 08/23/2014 1154   CO2 19* 05/13/2014 1132   BUN 47* 08/23/2014 1154   BUN 45.0* 05/13/2014 1132   CREATININE 1.14 08/23/2014 1154   CREATININE 1.4* 05/13/2014 1132      Component Value Date/Time   CALCIUM 11.2* 08/23/2014 1154   CALCIUM 8.4 05/13/2014 1132   ALKPHOS 93 07/26/2014 1709   ALKPHOS 115 05/13/2014 1132   AST 31 07/26/2014 1709   AST 42* 05/13/2014 1132   ALT 30 07/26/2014 1709   ALT 32 05/13/2014 1132   BILITOT 0.3 07/26/2014 1709   BILITOT 0.29 05/13/2014 1132       RADIOGRAPHIC STUDIES: BONE MARROW REPORT ASSESSMENT AND PLAN:  1) severe iron deficiency anemia secondary to gastrointestinal blood loss from the ostomy. She status post 2 doses of Feraheme infusion, last dose was given 12/23/2013.  I will send the patient to the lab today for repeat CBC, iron study and ferritin. If no significant abnormalities, she will come back for follow-up visit in 3 months with repeat lab work.  2) Thrombocytopenia: Her thrombocytopenia has completely resolved after the splenectomy. She actually has thrombocytosis secondary to  iron deficiency anemia.  3) History of stage IB non-small cell lung cancer diagnosed in 2014. Continue on observation.  She was advised to call immediately if she has any concerning symptoms in the interval. The patient voices understanding of current disease status and treatment options and is in agreement with the current care plan.  All questions were answered. The patient knows to call the clinic with any problems, questions or concerns. We can certainly see the patient much sooner if necessary.  Disclaimer: This note was dictated with voice recognition software. Similar sounding words can inadvertently be transcribed and may not be corrected upon review.

## 2014-09-08 NOTE — Telephone Encounter (Signed)
Notified pt of lab results and upcoming infusion. To expect call from scheduling for appt date and time Pt verbalized understanding. No further concerns

## 2014-09-09 ENCOUNTER — Telehealth: Payer: Self-pay | Admitting: Internal Medicine

## 2014-09-09 DIAGNOSIS — E1142 Type 2 diabetes mellitus with diabetic polyneuropathy: Secondary | ICD-10-CM | POA: Diagnosis not present

## 2014-09-09 NOTE — Telephone Encounter (Signed)
Called patient and she is aware of her iron appointments

## 2014-09-12 ENCOUNTER — Telehealth: Payer: Self-pay | Admitting: Internal Medicine

## 2014-09-12 MED ORDER — HUMULIN R U-500 (CONCENTRATED) 500 UNIT/ML ~~LOC~~ SOLN: SUBCUTANEOUS | Status: DC

## 2014-09-12 NOTE — Telephone Encounter (Signed)
Corrected pt's Humulin R dose. Resent to pharmacy. Advised pt that she needs to come in for her appt on Aug. 30th. Pt voiced understanding.

## 2014-09-12 NOTE — Telephone Encounter (Signed)
Patient stated that the pharmacy told her to call you concerning her medication, the dosage is not correct, please advise

## 2014-09-16 ENCOUNTER — Ambulatory Visit (HOSPITAL_BASED_OUTPATIENT_CLINIC_OR_DEPARTMENT_OTHER): Payer: Medicare Other

## 2014-09-16 VITALS — BP 143/33 | HR 60 | Temp 99.3°F | Resp 18

## 2014-09-16 DIAGNOSIS — D500 Iron deficiency anemia secondary to blood loss (chronic): Secondary | ICD-10-CM | POA: Diagnosis not present

## 2014-09-16 DIAGNOSIS — D509 Iron deficiency anemia, unspecified: Secondary | ICD-10-CM

## 2014-09-16 DIAGNOSIS — K922 Gastrointestinal hemorrhage, unspecified: Secondary | ICD-10-CM | POA: Diagnosis not present

## 2014-09-16 MED ORDER — SODIUM CHLORIDE 0.9 % IV SOLN
Freq: Once | INTRAVENOUS | Status: AC
  Administered 2014-09-16: 12:00:00 via INTRAVENOUS

## 2014-09-16 MED ORDER — SODIUM CHLORIDE 0.9 % IV SOLN
510.0000 mg | Freq: Once | INTRAVENOUS | Status: AC
  Administered 2014-09-16: 510 mg via INTRAVENOUS
  Filled 2014-09-16: qty 17

## 2014-09-16 NOTE — Patient Instructions (Signed)

## 2014-09-22 ENCOUNTER — Telehealth: Payer: Self-pay

## 2014-09-22 NOTE — Telephone Encounter (Signed)
The pt called and is hoping to clarify if she has a referral in place for her iron transfusions.  She stated she was confused on if the referral was placed and she should call, or not.

## 2014-09-22 NOTE — Telephone Encounter (Signed)
Pt has an appt on file for iron transfussion will call the pt

## 2014-09-23 ENCOUNTER — Ambulatory Visit (HOSPITAL_BASED_OUTPATIENT_CLINIC_OR_DEPARTMENT_OTHER): Payer: Medicare Other

## 2014-09-23 VITALS — BP 131/45 | HR 86 | Temp 99.0°F | Resp 18

## 2014-09-23 DIAGNOSIS — K922 Gastrointestinal hemorrhage, unspecified: Secondary | ICD-10-CM

## 2014-09-23 DIAGNOSIS — D500 Iron deficiency anemia secondary to blood loss (chronic): Secondary | ICD-10-CM | POA: Diagnosis present

## 2014-09-23 DIAGNOSIS — D509 Iron deficiency anemia, unspecified: Secondary | ICD-10-CM

## 2014-09-23 MED ORDER — SODIUM CHLORIDE 0.9 % IV SOLN
Freq: Once | INTRAVENOUS | Status: AC
  Administered 2014-09-23: 11:00:00 via INTRAVENOUS

## 2014-09-23 MED ORDER — SODIUM CHLORIDE 0.9 % IV SOLN
510.0000 mg | Freq: Once | INTRAVENOUS | Status: AC
  Administered 2014-09-23: 510 mg via INTRAVENOUS
  Filled 2014-09-23: qty 17

## 2014-09-23 NOTE — Telephone Encounter (Signed)
Neoma Laming has already spoken with pt.

## 2014-09-23 NOTE — Patient Instructions (Signed)

## 2014-09-26 ENCOUNTER — Other Ambulatory Visit: Payer: Self-pay | Admitting: Medical Oncology

## 2014-09-26 NOTE — Progress Notes (Unsigned)
erroneous

## 2014-09-27 ENCOUNTER — Other Ambulatory Visit: Payer: Self-pay | Admitting: Internal Medicine

## 2014-09-27 ENCOUNTER — Encounter: Payer: Self-pay | Admitting: Internal Medicine

## 2014-09-27 ENCOUNTER — Ambulatory Visit (INDEPENDENT_AMBULATORY_CARE_PROVIDER_SITE_OTHER): Payer: Medicare Other | Admitting: Internal Medicine

## 2014-09-27 VITALS — BP 130/62 | HR 64 | Temp 98.3°F | Resp 12 | Wt 204.0 lb

## 2014-09-27 DIAGNOSIS — IMO0002 Reserved for concepts with insufficient information to code with codable children: Secondary | ICD-10-CM

## 2014-09-27 DIAGNOSIS — E1140 Type 2 diabetes mellitus with diabetic neuropathy, unspecified: Secondary | ICD-10-CM | POA: Diagnosis not present

## 2014-09-27 DIAGNOSIS — E1165 Type 2 diabetes mellitus with hyperglycemia: Secondary | ICD-10-CM

## 2014-09-27 NOTE — Patient Instructions (Addendum)
Please change U500 insulin doses as follows:  U500 Insulin Before breakfast Before lunch Before dinner  Smaller meal '30 28 20  '$ Larger meal 35 33 25   Please inject the insulin 30 min before meals.  Please return in 1.5 months with your sugar log.   Please stop at the lab.

## 2014-09-27 NOTE — Progress Notes (Signed)
Patient ID: [name], female   DOB: [birth year], 64 y.o.   MRN: [id number]  HPI: [name] is a 64 y.o.-year-old female, returns for f/u for DM2, dx 2004, insulin-dependent since 2006/7 - then off in 2009 - then restarted 08/2012, uncontrolled, with complications (PN, admission for [facility] on 11/14/2012). Last visit 6 mo ago.   She feels poorly: weak, SOB, has RLQ AP, low grade fevers. She sleeps poorly, on 3 pillows, on high O2 flow.   She was admitted 03/2014 (hyperammoniemia) and 04/2014 (acute on chronic CHF).  Last hemoglobin A1c was: Lab Results  Component Value Date   HGBA1C 7.6* 05/17/2014   HGBA1C 9.6* 04/12/2014   HGBA1C 7.9* 01/04/2014   11/22/2013: Fructosamine 360 - Calculated HbA1c is 7.7%. Lab Results  Component Value Date   HGBA1C 6.8 07/29/2013   HGBA1C 6.9 04/22/2013   HGBA1C 8.6 11/14/2012   Pt was on a regimen of: - Lantus 40-30 units - pen  - Humalog 45-35-40 >> 55-45-45 units with B-L-D- pens  Copay $0 for her insulins. She was taken off Metformin 500 mg 2x a day.  She is now on U500 insulin: - 33-33-30 with B-L-D  Pt checks her sugars 4-6x a day and they are still very fluctuating: - am: 200s and even 310 and 320 >> 123-260, 312 >> 78, 85, 110-212, 291, 458 >> 40-150, 233, 251 - 2h after b'fast: 311-402 - highest sugars of the day >> 275-377 >> 189-200 >> n/c >> 172 - before lunch: 182-381 >> 190-200 >> 216-350 >> 119-404, 445 >> 284-390, 454 >> 68, 151-343 - 2h after lunch: n/c >> 160 x 1 check >> 246- 302 >> n/c  >> n/c - before dinner: 67, 140-150 >> 160-309, 401 >> 119, 273-445 >> 130, 219-362, 447 >> 65-181, 205, 394 - bedtime: 280-480 >> 104-435 >> 220-372 >> 104-105 >> 150-330, 416 >> 265-398 >> n/c >> 81-182, 311 No lows exc. 1x 78; she has hypoglycemia awareness at 70-80. Highest sugar was 500s.  - + CKD, last BUN/creatinine:  Lab Results  Component Value Date   BUN 47* 08/23/2014   Lab Results  Component Value Date   CREATININE  1.14 08/23/2014   - last eye exam was in 04/2012. No DR.  - + numbness and tingling in her feet.  She also has a history of Squamous cell lung cancer, hypertension, COPD, GERD, depression, anemia and thrombocytopenia, cirrhosis, IBS. She had a BM Bx  >> no malignancy. She had splenectomy for severe splenomegaly (02/24/2013) >> this was a difficult surgery. Had severe blood loss.   I reviewed pt's medications, allergies, PMH, social hx, family hx, and changes were documented in the history of present illness. Otherwise, unchanged from my initial visit note.  ROS: Constitutional: + weight gain, + fatigue, + hot flushes, + poor sleep, + nocturia Eyes: + blurry vision, no xerophthalmia ENT: + sore throat, no nodules palpated in throat, no dysphagia/odynophagia, no hoarseness, + tinnitus, + hypoacusis Cardiovascular: + CP/+ SOB/no palpitations/+ leg swelling Respiratory: no cough/+ SOB Gastrointestinal: no N/V/+ D/no C/heartburn Musculoskeletal: + muscle aches/+ joint aches Skin: no rashes, + itching, + easy bruising, + hair loss Neurological: + tremors/no numbness/tingling/dizziness, + HA   PE: BP 130/62 mmHg  Pulse 64  Temp(Src) 98.3 F (36.8 C) (Oral)  Resp 12  Wt 204 lb (92.534 kg)  SpO2 97%Body mass index is 36.15 kg/(m^2). Wt Readings from Last 3 Encounters:  09/27/14 204 lb (92.534 kg)  09/08/14 201 lb 8  oz (91.4 kg)  08/23/14 201 lb (91.173 kg)   Constitutional: obese, pale, in NAD, on O2 Eyes: PERRLA, EOMI, no exophthalmos ENT: moist mucous membranes, no thyromegaly, no cervical lymphadenopathy Cardiovascular: RRR, No MRG Respiratory: CTA B Gastrointestinal: abdomen soft, NT, ND, BS+ Musculoskeletal: no deformities, strength intact in all 4 Skin: moist, warm  ASSESSMENT: 1. DM2, insulin-dependent, uncontrolled, with complications - PN - h/o HHNK admission  PLAN:  1. Patient with long-standing, uncontrolled diabetes, on U500 insulin tx. Sugars are very fluctuating  per review of her log. Will give her different doses depending on the size of the meal and I highlighted the doses she should start with: - I suggested to  Patient Instructions   Please change U500 insulin doses as follows:  U500 Insulin Before breakfast Before lunch Before dinner  Smaller meal '30 28 20  '$ Larger meal 35 33 25   Please inject the insulin 30 min before meals.  Please return in 1.5 months with your sugar log.   Please stop at the lab.  - we need to check fructosamine - advised to get a new eye exam >> she is due - continue checking sugars at different times of the day - check 4 times a day, rotating checks- I gave her new logs - Return to clinic in 1.5 mo with sugar log  Orders Only on 09/27/2014  Component Date Value Ref Range Status  . Fructosamine 09/27/2014 232  190 - 270 umol/L Final   Calculated hemoglobin A1c from fructosamine is excellent, at 5.55%!

## 2014-09-28 ENCOUNTER — Ambulatory Visit (INDEPENDENT_AMBULATORY_CARE_PROVIDER_SITE_OTHER): Payer: Medicare Other | Admitting: Family Medicine

## 2014-09-28 ENCOUNTER — Encounter: Payer: Self-pay | Admitting: Family Medicine

## 2014-09-28 VITALS — BP 189/56 | HR 85 | Temp 99.7°F | Ht 63.0 in | Wt 204.0 lb

## 2014-09-28 DIAGNOSIS — D500 Iron deficiency anemia secondary to blood loss (chronic): Secondary | ICD-10-CM | POA: Diagnosis not present

## 2014-09-28 DIAGNOSIS — I5031 Acute diastolic (congestive) heart failure: Secondary | ICD-10-CM

## 2014-09-28 DIAGNOSIS — J439 Emphysema, unspecified: Secondary | ICD-10-CM | POA: Diagnosis not present

## 2014-09-28 DIAGNOSIS — K529 Noninfective gastroenteritis and colitis, unspecified: Secondary | ICD-10-CM

## 2014-09-28 MED ORDER — METRONIDAZOLE 500 MG PO TABS: 500.0000 mg | ORAL_TABLET | Freq: Three times a day (TID) | ORAL | Status: DC

## 2014-09-28 MED ORDER — CIPROFLOXACIN HCL 500 MG PO TABS: 500.0000 mg | ORAL_TABLET | Freq: Two times a day (BID) | ORAL | Status: DC

## 2014-09-28 NOTE — Progress Notes (Signed)
   Subjective:    Patient ID: [name], female    DOB: 17-Mar-1950, 64 y.o.   MRN: [id number]  HPI Here for 4 days of low grade fevers, RLQ abdominal pains, and SOB. No coughing or chest pain. No nausea or vomiting. Using her oxygen continuously at 2 liters during the day and at 3 liters at night. She has chronic diarrhea and often has some bright red blood in the stool (which is her baseline). She had labs 2 weeks ago showing a chronically elevated WBC at 15 (no left shift) and chronic anemia with a Hgb of 7.4. She is not eating much food but she is drinking plenty of fluids.    Review of Systems  Constitutional: Positive for fever and fatigue.  HENT: Negative.   Eyes: Negative.   Respiratory: Positive for shortness of breath. Negative for cough and wheezing.   Cardiovascular: Positive for leg swelling. Negative for chest pain and palpitations.  Gastrointestinal: Positive for abdominal pain, diarrhea and blood in stool. Negative for nausea, vomiting, constipation, abdominal distention and rectal pain.  Genitourinary: Negative.   Neurological: Negative.        Objective:   Physical Exam  Constitutional: She is oriented to person, place, and time.  She is frail and weak but at her baseline, walks slowly with her walker. Alert.   Eyes: Conjunctivae are normal. No scleral icterus.  Neck: Neck supple. No thyromegaly present.  Cardiovascular: Normal rate, regular rhythm, normal heart sounds and intact distal pulses.   Pulmonary/Chest: Effort normal and breath sounds normal. No respiratory distress. She has no wheezes. She has no rales.  Abdominal: Soft. Bowel sounds are normal. She exhibits no distension and no mass. There is no rebound and no guarding.  Moderately tender in the RLQ  Musculoskeletal:  2+ edema in both legs and feet  Lymphadenopathy:    She has no cervical adenopathy.  Neurological: She is alert and oriented to person, place, and time.          Assessment &  Plan:  Probable enteritis. The source of her fever appears to be abdominal. No evidence of pneumonia or CHF. Treat with Cipro and Flagyl for 10 days. She will follow up with [place] in one week. if she gets any worse she knows to call EMS to take her to the ED.

## 2014-09-28 NOTE — Progress Notes (Signed)
Pre visit review using our clinic review tool, if applicable. No additional management support is needed unless otherwise documented below in the visit note. 

## 2014-09-29 LAB — FRUCTOSAMINE: FRUCTOSAMINE: 232 umol/L (ref 190–270)

## 2014-10-03 ENCOUNTER — Inpatient Hospital Stay (HOSPITAL_COMMUNITY)
Admission: EM | Admit: 2014-10-03 | Discharge: 2014-10-09 | DRG: 760 | Disposition: A | Discharge status: Home Health | Payer: Medicare Other | Attending: Internal Medicine | Admitting: Internal Medicine

## 2014-10-03 ENCOUNTER — Emergency Department (HOSPITAL_COMMUNITY): Payer: Medicare Other

## 2014-10-03 ENCOUNTER — Encounter (HOSPITAL_COMMUNITY): Payer: Self-pay | Admitting: *Deleted

## 2014-10-03 DIAGNOSIS — Z9981 Dependence on supplemental oxygen: Secondary | ICD-10-CM

## 2014-10-03 DIAGNOSIS — D638 Anemia in other chronic diseases classified elsewhere: Secondary | ICD-10-CM | POA: Diagnosis present

## 2014-10-03 DIAGNOSIS — Z87891 Personal history of nicotine dependence: Secondary | ICD-10-CM | POA: Diagnosis not present

## 2014-10-03 DIAGNOSIS — R1900 Intra-abdominal and pelvic swelling, mass and lump, unspecified site: Secondary | ICD-10-CM

## 2014-10-03 DIAGNOSIS — K589 Irritable bowel syndrome without diarrhea: Secondary | ICD-10-CM | POA: Diagnosis present

## 2014-10-03 DIAGNOSIS — F419 Anxiety disorder, unspecified: Secondary | ICD-10-CM | POA: Diagnosis present

## 2014-10-03 DIAGNOSIS — I10 Essential (primary) hypertension: Secondary | ICD-10-CM | POA: Diagnosis present

## 2014-10-03 DIAGNOSIS — Z7951 Long term (current) use of inhaled steroids: Secondary | ICD-10-CM

## 2014-10-03 DIAGNOSIS — Z902 Acquired absence of lung [part of]: Secondary | ICD-10-CM | POA: Diagnosis present

## 2014-10-03 DIAGNOSIS — Z950 Presence of cardiac pacemaker: Secondary | ICD-10-CM

## 2014-10-03 DIAGNOSIS — N838 Other noninflammatory disorders of ovary, fallopian tube and broad ligament: Principal | ICD-10-CM | POA: Diagnosis present

## 2014-10-03 DIAGNOSIS — Z8711 Personal history of peptic ulcer disease: Secondary | ICD-10-CM

## 2014-10-03 DIAGNOSIS — I2510 Atherosclerotic heart disease of native coronary artery without angina pectoris: Secondary | ICD-10-CM | POA: Diagnosis present

## 2014-10-03 DIAGNOSIS — Z880 Allergy status to penicillin: Secondary | ICD-10-CM

## 2014-10-03 DIAGNOSIS — K219 Gastro-esophageal reflux disease without esophagitis: Secondary | ICD-10-CM | POA: Diagnosis present

## 2014-10-03 DIAGNOSIS — T50905S Adverse effect of unspecified drugs, medicaments and biological substances, sequela: Secondary | ICD-10-CM | POA: Diagnosis not present

## 2014-10-03 DIAGNOSIS — E041 Nontoxic single thyroid nodule: Secondary | ICD-10-CM | POA: Diagnosis present

## 2014-10-03 DIAGNOSIS — Z794 Long term (current) use of insulin: Secondary | ICD-10-CM

## 2014-10-03 DIAGNOSIS — Z79891 Long term (current) use of opiate analgesic: Secondary | ICD-10-CM

## 2014-10-03 DIAGNOSIS — E785 Hyperlipidemia, unspecified: Secondary | ICD-10-CM | POA: Diagnosis present

## 2014-10-03 DIAGNOSIS — I5032 Chronic diastolic (congestive) heart failure: Secondary | ICD-10-CM | POA: Diagnosis present

## 2014-10-03 DIAGNOSIS — Z8249 Family history of ischemic heart disease and other diseases of the circulatory system: Secondary | ICD-10-CM

## 2014-10-03 DIAGNOSIS — D500 Iron deficiency anemia secondary to blood loss (chronic): Secondary | ICD-10-CM | POA: Diagnosis present

## 2014-10-03 DIAGNOSIS — Z9049 Acquired absence of other specified parts of digestive tract: Secondary | ICD-10-CM | POA: Diagnosis present

## 2014-10-03 DIAGNOSIS — R1084 Generalized abdominal pain: Secondary | ICD-10-CM | POA: Diagnosis present

## 2014-10-03 DIAGNOSIS — Z833 Family history of diabetes mellitus: Secondary | ICD-10-CM

## 2014-10-03 DIAGNOSIS — N179 Acute kidney failure, unspecified: Secondary | ICD-10-CM | POA: Diagnosis present

## 2014-10-03 DIAGNOSIS — Z800 Family history of malignant neoplasm of digestive organs: Secondary | ICD-10-CM | POA: Diagnosis not present

## 2014-10-03 DIAGNOSIS — Z8541 Personal history of malignant neoplasm of cervix uteri: Secondary | ICD-10-CM | POA: Diagnosis not present

## 2014-10-03 DIAGNOSIS — J449 Chronic obstructive pulmonary disease, unspecified: Secondary | ICD-10-CM | POA: Diagnosis present

## 2014-10-03 DIAGNOSIS — Z85118 Personal history of other malignant neoplasm of bronchus and lung: Secondary | ICD-10-CM

## 2014-10-03 DIAGNOSIS — J45909 Unspecified asthma, uncomplicated: Secondary | ICD-10-CM | POA: Diagnosis present

## 2014-10-03 DIAGNOSIS — J9610 Chronic respiratory failure, unspecified whether with hypoxia or hypercapnia: Secondary | ICD-10-CM | POA: Diagnosis present

## 2014-10-03 DIAGNOSIS — E875 Hyperkalemia: Secondary | ICD-10-CM | POA: Diagnosis present

## 2014-10-03 DIAGNOSIS — R071 Chest pain on breathing: Secondary | ICD-10-CM

## 2014-10-03 DIAGNOSIS — Z886 Allergy status to analgesic agent status: Secondary | ICD-10-CM | POA: Diagnosis not present

## 2014-10-03 DIAGNOSIS — Z885 Allergy status to narcotic agent status: Secondary | ICD-10-CM | POA: Diagnosis not present

## 2014-10-03 DIAGNOSIS — Z7289 Other problems related to lifestyle: Secondary | ICD-10-CM | POA: Diagnosis not present

## 2014-10-03 DIAGNOSIS — E1140 Type 2 diabetes mellitus with diabetic neuropathy, unspecified: Secondary | ICD-10-CM

## 2014-10-03 DIAGNOSIS — E119 Type 2 diabetes mellitus without complications: Secondary | ICD-10-CM | POA: Diagnosis present

## 2014-10-03 DIAGNOSIS — E0590 Thyrotoxicosis, unspecified without thyrotoxic crisis or storm: Secondary | ICD-10-CM | POA: Diagnosis present

## 2014-10-03 DIAGNOSIS — K922 Gastrointestinal hemorrhage, unspecified: Secondary | ICD-10-CM | POA: Diagnosis not present

## 2014-10-03 DIAGNOSIS — E7220 Disorder of urea cycle metabolism, unspecified: Secondary | ICD-10-CM | POA: Diagnosis present

## 2014-10-03 DIAGNOSIS — R079 Chest pain, unspecified: Secondary | ICD-10-CM | POA: Diagnosis present

## 2014-10-03 DIAGNOSIS — K435 Parastomal hernia without obstruction or  gangrene: Secondary | ICD-10-CM | POA: Diagnosis present

## 2014-10-03 DIAGNOSIS — K9409 Other complications of colostomy: Secondary | ICD-10-CM | POA: Diagnosis present

## 2014-10-03 DIAGNOSIS — R1031 Right lower quadrant pain: Secondary | ICD-10-CM | POA: Diagnosis not present

## 2014-10-03 DIAGNOSIS — K717 Toxic liver disease with fibrosis and cirrhosis of liver: Secondary | ICD-10-CM | POA: Diagnosis present

## 2014-10-03 DIAGNOSIS — D696 Thrombocytopenia, unspecified: Secondary | ICD-10-CM | POA: Diagnosis present

## 2014-10-03 DIAGNOSIS — IMO0002 Reserved for concepts with insufficient information to code with codable children: Secondary | ICD-10-CM

## 2014-10-03 DIAGNOSIS — Z8049 Family history of malignant neoplasm of other genital organs: Secondary | ICD-10-CM | POA: Diagnosis not present

## 2014-10-03 DIAGNOSIS — E1165 Type 2 diabetes mellitus with hyperglycemia: Secondary | ICD-10-CM

## 2014-10-03 DIAGNOSIS — E871 Hypo-osmolality and hyponatremia: Secondary | ICD-10-CM | POA: Diagnosis present

## 2014-10-03 LAB — TYPE AND SCREEN
ABO/RH(D): A POS
Antibody Screen: NEGATIVE

## 2014-10-03 LAB — URINALYSIS, ROUTINE W REFLEX MICROSCOPIC
BILIRUBIN URINE: NEGATIVE
Glucose, UA: NEGATIVE mg/dL
Hgb urine dipstick: NEGATIVE
Ketones, ur: NEGATIVE mg/dL
LEUKOCYTES UA: NEGATIVE
NITRITE: NEGATIVE
PH: 5.5 (ref 5.0–8.0)
Protein, ur: NEGATIVE mg/dL
SPECIFIC GRAVITY, URINE: 1.013 (ref 1.005–1.030)
UROBILINOGEN UA: 0.2 mg/dL (ref 0.0–1.0)

## 2014-10-03 LAB — GLUCOSE, CAPILLARY
GLUCOSE-CAPILLARY: 30 mg/dL — AB (ref 65–99)
Glucose-Capillary: 30 mg/dL — CL (ref 65–99)
Glucose-Capillary: 236 mg/dL — ABNORMAL HIGH (ref 65–99)

## 2014-10-03 LAB — I-STAT TROPONIN, ED
TROPONIN I, POC: 0.07 ng/mL (ref 0.00–0.08)
Troponin i, poc: 0.1 ng/mL (ref 0.00–0.08)

## 2014-10-03 LAB — CBC
HEMATOCRIT: 30.3 % — AB (ref 36.0–46.0)
HEMOGLOBIN: 9.5 g/dL — AB (ref 12.0–15.0)
MCH: 27.7 pg (ref 26.0–34.0)
MCHC: 31.4 g/dL (ref 30.0–36.0)
MCV: 88.3 fL (ref 78.0–100.0)
Platelets: 419 10*3/uL — ABNORMAL HIGH (ref 150–400)
RBC: 3.43 MIL/uL — AB (ref 3.87–5.11)
RDW: 26.7 % — AB (ref 11.5–15.5)
WBC: 17.7 10*3/uL — AB (ref 4.0–10.5)

## 2014-10-03 LAB — LIPASE, BLOOD: LIPASE: 60 U/L — AB (ref 22–51)

## 2014-10-03 LAB — COMPREHENSIVE METABOLIC PANEL
ALT: 39 U/L (ref 14–54)
ANION GAP: 8 (ref 5–15)
AST: 39 U/L (ref 15–41)
Albumin: 3.1 g/dL — ABNORMAL LOW (ref 3.5–5.0)
Alkaline Phosphatase: 60 U/L (ref 38–126)
BILIRUBIN TOTAL: 0.3 mg/dL (ref 0.3–1.2)
BUN: 49 mg/dL — AB (ref 6–20)
CHLORIDE: 105 mmol/L (ref 101–111)
CO2: 22 mmol/L (ref 22–32)
Calcium: 11.5 mg/dL — ABNORMAL HIGH (ref 8.9–10.3)
Creatinine, Ser: 1.47 mg/dL — ABNORMAL HIGH (ref 0.44–1.00)
GFR calc Af Amer: 43 mL/min — ABNORMAL LOW (ref >60)
GFR, EST NON AFRICAN AMERICAN: 37 mL/min — AB (ref >60)
Glucose, Bld: 76 mg/dL (ref 65–99)
POTASSIUM: 5.3 mmol/L — AB (ref 3.5–5.1)
Sodium: 135 mmol/L (ref 135–145)
TOTAL PROTEIN: 6 g/dL — AB (ref 6.5–8.1)

## 2014-10-03 LAB — PROTIME-INR
INR: 1.1 (ref 0.00–1.49)
PROTHROMBIN TIME: 14.4 s (ref 11.6–15.2)

## 2014-10-03 LAB — I-STAT CG4 LACTIC ACID, ED
LACTIC ACID, VENOUS: 2 mmol/L (ref 0.5–2.0)
LACTIC ACID, VENOUS: 1.02 mmol/L (ref 0.5–2.0)

## 2014-10-03 LAB — AMMONIA: AMMONIA: 79 umol/L — AB (ref 9–35)

## 2014-10-03 LAB — APTT: aPTT: 27 seconds (ref 24–37)

## 2014-10-03 MED ORDER — INSULIN GLARGINE 100 UNIT/ML ~~LOC~~ SOLN: 35.0000 [IU] | Freq: Every day | SUBCUTANEOUS | Status: DC

## 2014-10-03 MED ORDER — DICYCLOMINE HCL 10 MG PO CAPS
10.0000 mg | ORAL_CAPSULE | Freq: Three times a day (TID) | ORAL | Status: DC | PRN
  Administered 2014-10-06: 10 mg via ORAL
  Filled 2014-10-03: qty 1

## 2014-10-03 MED ORDER — INSULIN ASPART 100 UNIT/ML ~~LOC~~ SOLN: 0.0000 [IU] | Freq: Three times a day (TID) | SUBCUTANEOUS | Status: DC

## 2014-10-03 MED ORDER — INSULIN ASPART 100 UNIT/ML ~~LOC~~ SOLN: 4.0000 [IU] | Freq: Three times a day (TID) | SUBCUTANEOUS | Status: DC

## 2014-10-03 MED ORDER — INSULIN ASPART 100 UNIT/ML ~~LOC~~ SOLN
6.0000 [IU] | Freq: Three times a day (TID) | SUBCUTANEOUS | Status: DC
  Administered 2014-10-04 – 2014-10-09 (×16): 6 [IU] via SUBCUTANEOUS

## 2014-10-03 MED ORDER — FOLIC ACID 1 MG PO TABS
1.0000 mg | ORAL_TABLET | Freq: Every day | ORAL | Status: DC
  Administered 2014-10-04 – 2014-10-09 (×6): 1 mg via ORAL
  Filled 2014-10-03 (×6): qty 1

## 2014-10-03 MED ORDER — IPRATROPIUM-ALBUTEROL 0.5-2.5 (3) MG/3ML IN SOLN: 3.0000 mL | RESPIRATORY_TRACT | Status: DC | PRN

## 2014-10-03 MED ORDER — FUROSEMIDE 40 MG PO TABS
120.0000 mg | ORAL_TABLET | Freq: Two times a day (BID) | ORAL | Status: DC
  Administered 2014-10-03 – 2014-10-05 (×4): 120 mg via ORAL
  Filled 2014-10-03 (×8): qty 1

## 2014-10-03 MED ORDER — LOSARTAN POTASSIUM 50 MG PO TABS
50.0000 mg | ORAL_TABLET | Freq: Every day | ORAL | Status: DC
  Administered 2014-10-04 – 2014-10-08 (×5): 50 mg via ORAL
  Filled 2014-10-03 (×5): qty 1

## 2014-10-03 MED ORDER — INSULIN ASPART 100 UNIT/ML ~~LOC~~ SOLN
0.0000 [IU] | Freq: Three times a day (TID) | SUBCUTANEOUS | Status: DC
  Administered 2014-10-04: 2 [IU] via SUBCUTANEOUS
  Administered 2014-10-04 (×2): 5 [IU] via SUBCUTANEOUS
  Administered 2014-10-05 – 2014-10-06 (×4): 3 [IU] via SUBCUTANEOUS
  Administered 2014-10-06: 2 [IU] via SUBCUTANEOUS
  Administered 2014-10-06: 3 [IU] via SUBCUTANEOUS
  Administered 2014-10-07: 2 [IU] via SUBCUTANEOUS
  Administered 2014-10-07 – 2014-10-08 (×5): 3 [IU] via SUBCUTANEOUS
  Administered 2014-10-09: 2 [IU] via SUBCUTANEOUS

## 2014-10-03 MED ORDER — PANTOPRAZOLE SODIUM 40 MG PO TBEC
80.0000 mg | DELAYED_RELEASE_TABLET | Freq: Every day | ORAL | Status: DC
  Administered 2014-10-04 – 2014-10-08 (×5): 80 mg via ORAL
  Filled 2014-10-03 (×5): qty 2

## 2014-10-03 MED ORDER — POLYETHYLENE GLYCOL 3350 17 G PO PACK: 17.0000 g | PACK | Freq: Two times a day (BID) | ORAL | Status: DC | PRN

## 2014-10-03 MED ORDER — HYDROMORPHONE HCL 1 MG/ML IJ SOLN
1.0000 mg | INTRAMUSCULAR | Status: DC | PRN
  Administered 2014-10-03 – 2014-10-05 (×16): 1 mg via INTRAVENOUS
  Filled 2014-10-03 (×16): qty 1

## 2014-10-03 MED ORDER — ALBUTEROL SULFATE (2.5 MG/3ML) 0.083% IN NEBU: 2.5000 mg | INHALATION_SOLUTION | RESPIRATORY_TRACT | Status: DC | PRN

## 2014-10-03 MED ORDER — SODIUM CHLORIDE 0.9 % IV BOLUS (SEPSIS)
1000.0000 mL | Freq: Once | INTRAVENOUS | Status: AC
Start: 2014-10-03 — End: 2014-10-03
  Administered 2014-10-03: 1000 mL via INTRAVENOUS

## 2014-10-03 MED ORDER — POLYETHYLENE GLYCOL 3350 17 GM/SCOOP PO POWD
17.0000 g | Freq: Two times a day (BID) | ORAL | Status: DC | PRN
  Filled 2014-10-03: qty 255

## 2014-10-03 MED ORDER — FUROSEMIDE 80 MG PO TABS: 120.0000 mg | ORAL_TABLET | Freq: Two times a day (BID) | ORAL | Status: DC

## 2014-10-03 MED ORDER — RIFAXIMIN 550 MG PO TABS
550.0000 mg | ORAL_TABLET | Freq: Two times a day (BID) | ORAL | Status: DC
  Administered 2014-10-03 – 2014-10-09 (×12): 550 mg via ORAL
  Filled 2014-10-03 (×14): qty 1

## 2014-10-03 MED ORDER — HYDRALAZINE HCL 25 MG PO TABS
25.0000 mg | ORAL_TABLET | Freq: Two times a day (BID) | ORAL | Status: DC
  Administered 2014-10-03 – 2014-10-09 (×12): 25 mg via ORAL
  Filled 2014-10-03 (×12): qty 1

## 2014-10-03 MED ORDER — HYDROMORPHONE HCL 1 MG/ML IJ SOLN
1.0000 mg | Freq: Once | INTRAMUSCULAR | Status: AC
  Administered 2014-10-03: 1 mg via INTRAVENOUS
  Filled 2014-10-03: qty 1

## 2014-10-03 MED ORDER — INSULIN GLARGINE 100 UNIT/ML ~~LOC~~ SOLN: 20.0000 [IU] | Freq: Every day | SUBCUTANEOUS | Status: DC

## 2014-10-03 MED ORDER — ONDANSETRON HCL 4 MG PO TABS: 4.0000 mg | ORAL_TABLET | Freq: Four times a day (QID) | ORAL | Status: DC | PRN

## 2014-10-03 MED ORDER — ONDANSETRON HCL 4 MG/2ML IJ SOLN
4.0000 mg | Freq: Once | INTRAMUSCULAR | Status: AC
  Administered 2014-10-03: 4 mg via INTRAVENOUS
  Filled 2014-10-03: qty 2

## 2014-10-03 MED ORDER — IPRATROPIUM-ALBUTEROL 18-103 MCG/ACT IN AERO: 2.0000 | INHALATION_SPRAY | RESPIRATORY_TRACT | Status: DC | PRN

## 2014-10-03 MED ORDER — IOHEXOL 350 MG/ML SOLN
100.0000 mL | Freq: Once | INTRAVENOUS | Status: AC | PRN
  Administered 2014-10-03: 100 mL via INTRAVENOUS

## 2014-10-03 MED ORDER — ATORVASTATIN CALCIUM 20 MG PO TABS
20.0000 mg | ORAL_TABLET | Freq: Every morning | ORAL | Status: DC
  Administered 2014-10-04 – 2014-10-09 (×6): 20 mg via ORAL
  Filled 2014-10-03 (×6): qty 1

## 2014-10-03 MED ORDER — ONDANSETRON HCL 4 MG/2ML IJ SOLN
4.0000 mg | Freq: Four times a day (QID) | INTRAMUSCULAR | Status: DC | PRN
  Administered 2014-10-04 – 2014-10-05 (×3): 4 mg via INTRAVENOUS
  Filled 2014-10-03 (×3): qty 2

## 2014-10-03 MED ORDER — METOCLOPRAMIDE HCL 10 MG PO TABS
10.0000 mg | ORAL_TABLET | Freq: Three times a day (TID) | ORAL | Status: DC
  Administered 2014-10-03 – 2014-10-09 (×17): 10 mg via ORAL
  Filled 2014-10-03 (×17): qty 1

## 2014-10-03 MED ORDER — INSULIN GLARGINE 100 UNIT/ML ~~LOC~~ SOLN
10.0000 [IU] | Freq: Every day | SUBCUTANEOUS | Status: DC
  Administered 2014-10-03: 10 [IU] via SUBCUTANEOUS
  Filled 2014-10-03 (×2): qty 0.1

## 2014-10-03 MED ORDER — MOMETASONE FURO-FORMOTEROL FUM 100-5 MCG/ACT IN AERO
2.0000 | INHALATION_SPRAY | Freq: Two times a day (BID) | RESPIRATORY_TRACT | Status: DC
  Administered 2014-10-03 – 2014-10-09 (×8): 2 via RESPIRATORY_TRACT
  Filled 2014-10-03 (×2): qty 8.8

## 2014-10-03 MED ORDER — PROMETHAZINE HCL 25 MG PO TABS: 25.0000 mg | ORAL_TABLET | ORAL | Status: DC | PRN

## 2014-10-03 MED ORDER — ISOSORBIDE DINITRATE 20 MG PO TABS
20.0000 mg | ORAL_TABLET | Freq: Two times a day (BID) | ORAL | Status: DC
  Administered 2014-10-03 – 2014-10-09 (×12): 20 mg via ORAL
  Filled 2014-10-03 (×3): qty 1
  Filled 2014-10-03: qty 2
  Filled 2014-10-03 (×2): qty 1
  Filled 2014-10-03: qty 2
  Filled 2014-10-03 (×2): qty 1
  Filled 2014-10-03 (×5): qty 2
  Filled 2014-10-03 (×2): qty 1
  Filled 2014-10-03: qty 2
  Filled 2014-10-03 (×3): qty 1
  Filled 2014-10-03: qty 2
  Filled 2014-10-03: qty 1
  Filled 2014-10-03 (×3): qty 2

## 2014-10-03 MED ORDER — DIAZEPAM 5 MG PO TABS
5.0000 mg | ORAL_TABLET | Freq: Four times a day (QID) | ORAL | Status: DC | PRN
  Administered 2014-10-06 – 2014-10-08 (×3): 5 mg via ORAL
  Filled 2014-10-03 (×3): qty 1

## 2014-10-03 MED ORDER — LACTULOSE 10 GM/15ML PO SOLN
10.0000 g | Freq: Four times a day (QID) | ORAL | Status: DC
  Administered 2014-10-03 – 2014-10-04 (×2): 10 g via ORAL
  Filled 2014-10-03 (×2): qty 15

## 2014-10-03 NOTE — ED Notes (Signed)
Phlebotomy at the bedside  

## 2014-10-03 NOTE — ED Notes (Signed)
Dr. Gardner at bedside 

## 2014-10-03 NOTE — ED Notes (Signed)
Attempted to call report

## 2014-10-03 NOTE — ED Notes (Signed)
Pt presents via [facility] with c/o worsening RLQ abdominal pain and chest pain since yesterday.  Per pt report pt was started on Cipro and Flagyl prophylactic by MD for stomach bug on Sep 1st.  Pt with extensive hx: uterine mass, cirrhosis of liver, colostomy, PPM, Afib, CHF, PNA, lobectomy, lung cx.  Pt also reports blood in colostomy which she reports is normal sometimes for her.  Pt CP worse with deep breath and movement.  Pt chronically on 2 L home O2.  Lung sounds clear.  Pt received '4mg'$  Zofran en route.  BP-149/89 P-70 NSR O2-99% 2L, EKG unremarkable.  Pt a x 4, NAD.  Pt reports family concern that her ammonia level is elevated, per pt she has been more forgetful.

## 2014-10-03 NOTE — ED Notes (Signed)
Dr. Gardner, hospitalist, at the bedside.  

## 2014-10-03 NOTE — ED Provider Notes (Signed)
She reports 1 week history of CSN: [id number]     Arrival date & time 10/03/14  1535 History   First MD Initiated Contact with Patient 10/03/14 1558     Chief Complaint  Patient presents with  . Abdominal Pain  . Chest Pain     (Consider location/radiation/quality/duration/timing/severity/associated sxs/prior Treatment) HPI 64 year old female who presents with abdominal pain and chest pain. She has an extensive medical history. She has multiple abdominal surgeries in the past including cholecystectomy, appendectomy, hernia repair, left colectomy with colostomy, and abdominal hysterectomy. She also has a history of non-small cell lung carcinoma status post left upper lobectomy and COPD on 2 L home oxygen. She also has known abdominal hernia and right ovarian mass, deemed not operable. She also has a history of drug induced liver cirrhosis, diabetes, and pacemaker.  She reports 1 week history of right lower quadrant abdominal pain, with low-grade fevers. She was seen by her primary care physician on August 31, and started on Cipro and Flagyl. She has had persistent low-grade fevers and worsening abdominal pain with nausea. Denies any diarrhea, increased or decreased ostomy output, vomiting. Over the course of the past day she has had cough productive of brown sputum, with development of a pleuritic left-sided chest pain that seems worse when she is more active. She has had dyspnea on exertion during this time as well,  and extreme fatigue with intermittent episodes of confusion.  Past Medical History  Diagnosis Date  . Cirrhosis   . GERD (gastroesophageal reflux disease)   . Cervical disc syndrome     trouble turnng neck at times  . Chronic respiratory failure   . Diverticulitis of colon   . Perforation of colon   . IBS (irritable bowel syndrome)   . Chronic lower GI bleeding   . Overactive bladder   . Thrombocytopenia     sees Dr. Julien Nordmann   . Splenomegaly   . Depression   . Hypertension    . Asthma   . Heart murmur   . Hyperlipidemia   . COPD (chronic obstructive pulmonary disease)     sees Dr. Gwenette Greet   . Colostomy care   . Hypercalcemia   . CHF (congestive heart failure)   . Pacemaker 12/14/2013  . 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 Cervical cancer many years ago  . Complication of anesthesia     woke up during colonscopy and endoscopy in past  . History of blood transfusion "several"    "bleeding via ostomy" (01/04/2014)  . On home oxygen therapy     "2L; 24/7" (01/04/2014)  . Pneumonia "1-2 times"  . Chronic bronchitis "several times"  . Sleep apnea 2012    mild, no cpap needed  . Type II diabetes mellitus     sees Dr. Cruzita Lederer   . Chronic disease anemia     sees Dr. Julien Nordmann, due to chronic disease and GI losses   . History of stomach ulcers   . Migraine     "last one was several years ago" (01/04/2014)  . Arthritis     "tailbone; hands; legs" (01/04/2014)  . Anxiety   . Pericardial effusion 2007, 2015.   [name] Chronic abdominal pain     IBS  . Cirrhosis of liver     stage 4  . 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  Patient has mild copd.       Past Surgical History  Procedure Laterality Date  . Colostomy  11/06/2005  . Lung removal, partial Left 2004    upper lobe removed  . Left colectomy  11/06/2005    Hartmann resection of sigmoid colon and end colostomy.  . Esophagogastroduodenoscopy  03/19/2011    Procedure: ESOPHAGOGASTRODUODENOSCOPY (EGD);  Surgeon: [name], MD;  Location: [facility];  Service: Endoscopy;  Laterality: N/A;  . Colonoscopy  03/19/2011    Procedure: COLONOSCOPY;  Surgeon: [name], MD;  Location: [facility];  Service: Endoscopy;  Laterality: N/A;  . Givens capsule study  03/20/2011    Procedure: GIVENS CAPSULE STUDY;  Surgeon: [name], MD;  Location: [facility]  ENDOSCOPY;  Service: Endoscopy;  Laterality: N/A;  . Small bowel obstruction repair  March 2012  . Umbilical hernia repair  March 2012  . Splenectomy, total N/A 02/24/2013    Procedure: SPLENECTOMY;  Surgeon: Harl Bowie, MD;  Location: [place];  Service: General;  Laterality: N/A;  . Orif patella Left 04/21/2013    Procedure: OPEN REDUCTION INTERNAL (ORIF) FIXATION PATELLA;  Surgeon: Augustin Schooling, MD;  Location: [place];  Service: Orthopedics;  Laterality: Left;  . Pacemaker insertion  2015  . Esophagogastroduodenoscopy (egd) with propofol N/A 11/02/2013    Procedure: ESOPHAGOGASTRODUODENOSCOPY (EGD) WITH PROPOFOL;  Surgeon: [name], MD;  Location: [facility];  Service: Endoscopy;  Laterality: N/A;  EGD with APC  . Hot hemostasis N/A 11/02/2013    Procedure: HOT HEMOSTASIS (ARGON PLASMA COAGULATION/BICAP);  Surgeon: [name], MD;  Location: [facility];  Service: Endoscopy;  Laterality: N/A;  . Colon surgery    . Hernia repair    . Fracture surgery    . Abdominal hysterectomy    . Tubal ligation    . Tonsillectomy  1959  . Appendectomy  1962  . Cholecystectomy    . Cardiac catheterization  1967  . Permanent pacemaker insertion N/A 09/07/2013    Procedure: PERMANENT PACEMAKER INSERTION;  Surgeon: Evans Lance, MD;  Location: [facility] CATH LAB;  Service: Cardiovascular;  Laterality: N/A;  . Tee without cardioversion N/A 01/06/2014    Procedure: TRANSESOPHAGEAL ECHOCARDIOGRAM (TEE);  Surgeon: Lelon Perla, MD;  Location: [facility] ENDOSCOPY;  Service: Cardiovascular;  Laterality: N/A;  . Colostomy  11/06/2005    OPERATIVE REPORT   Family History  Problem Relation Age of Onset  . Coronary artery disease    . Diabetes type II    . Heart attack Mother   . Diabetes type II Mother   . Cirrhosis Father   . Heart attack Sister   . Pancreatic cancer Mother     secondary from surgery  . Cervical cancer Maternal Grandmother   . Hypertension Father   . Hypertension Sister   .  Hypertension Son   . Hypertension Daughter   . Stroke Neg Hx    Social History  Substance Use Topics  . Smoking status: Former Smoker -- 2.00 packs/day for 35 years    Types: Cigarettes    Quit date: 03/24/2002  . Smokeless tobacco: Never Used  . Alcohol Use: No   OB History    Gravida Para Term Preterm AB TAB SAB Ectopic Multiple Living   '6 4        4     '$ Review of Systems 10/14 systems reviewed and are negative other than those stated in the HPI  Allergies  Acetaminophen; Morphine;  Morphine and related; Other; Penicillins; Trazodone and nefazodone; Codeine phosphate; Hydrocodone-acetaminophen; Cephalexin; and Hydrocodone-acetaminophen  Home Medications   Prior to Admission medications   Medication Sig Start Date End Date Taking? Authorizing Provider  albuterol (PROVENTIL) (2.5 MG/3ML) 0.083% nebulizer solution Take 3 mLs (2.5 mg total) by nebulization every 4 (four) hours as needed for wheezing or shortness of breath. 03/02/14  Yes [name], MD  albuterol-ipratropium (COMBIVENT) 18-103 MCG/ACT inhaler Inhale 2 puffs into the lungs every 4 (four) hours as needed for wheezing or shortness of breath.    Yes Historical Provider, MD  atorvastatin (LIPITOR) 20 MG tablet Take 1 tablet (20 mg total) by mouth every morning. 03/02/14  Yes [name], MD  ciprofloxacin (CIPRO) 500 MG tablet Take 1 tablet (500 mg total) by mouth 2 (two) times daily. 09/28/14  Yes [name], MD  diazepam (VALIUM) 5 MG tablet Take 5 mg by mouth every 6 (six) hours as needed for anxiety.  08/15/14  Yes Historical Provider, MD  dicyclomine (BENTYL) 10 MG capsule Take 1 capsule (10 mg total) by mouth 3 (three) times daily as needed for spasms. 05/23/14  Yes Domenic Polite, MD  esomeprazole (NEXIUM) 40 MG capsule Take 40 mg by mouth at bedtime.  03/02/14  Yes Historical Provider, MD  Fluticasone-Salmeterol (ADVAIR) 250-50 MCG/DOSE AEPB Inhale 1 puff into the lungs every 12 (twelve) hours. 03/02/14  Yes [name], MD  folic acid (FOLVITE) 1 MG tablet TAKE ONE TABLET BY MOUTH ONCE DAILY IN THE EVENING 05/10/14  Yes [name], MD  furosemide (LASIX) 40 MG tablet Take 3 tablets (120 mg total) by mouth 2 (two) times daily. 07/04/14  Yes [name], MD  HUMULIN R 500 UNIT/ML injection Inject 33 units before breakfast, 33 units before lunch and 30units before dinner daily as instructed. Patient taking differently: Inject 28-35 Units into the skin 3 (three) times daily with meals. Inject 35 units before breakfast, 28 units before lunch and 35 units before dinner daily as instructed. 09/12/14  Yes Philemon Kingdom, MD  hydrALAZINE (APRESOLINE) 25 MG tablet Take 1 tablet (25 mg total) by mouth 2 (two) times daily. 06/29/14  Yes [name], MD  isosorbide dinitrate (ISORDIL) 20 MG tablet Take 1 tablet (20 mg total) by mouth 2 (two) times daily. 07/26/14  Yes Troy Sine, MD  lactulose (CHRONULAC) 10 GM/15ML solution Take 15 mLs (10 g total) by mouth 3 (three) times daily. Patient taking differently: Take 10 g by mouth 4 (four) times daily.  06/29/14  Yes [name], MD  losartan (COZAAR) 50 MG tablet Take 50 mg by mouth daily.  08/15/14  Yes Historical Provider, MD  metoCLOPramide (REGLAN) 10 MG tablet Take 1 tablet (10 mg total) by mouth 3 (three) times daily. 07/26/14  Yes [name], MD  metroNIDAZOLE (FLAGYL) 500 MG tablet Take 1 tablet (500 mg total) by mouth 3 (three) times daily. 09/28/14  Yes [name], MD  Oxycodone HCl 20 MG TABS Take 1 tablet (20 mg total) by mouth every 3 (three) hours as needed (pain). 06/29/14  Yes [name], MD  polyethylene glycol powder (GLYCOLAX/MIRALAX) powder Take 17 g by mouth 2 (two) times daily as needed. Patient taking differently: Take 17 g by mouth 2 (two) times daily as needed for mild constipation.  08/03/14  Yes [name], MD  potassium chloride (KLOR-CON 10) 10 MEQ tablet Take 1 tablet (10 mEq total) by mouth daily.  03/16/14  Yes [name], MD   promethazine (PHENERGAN) 25 MG tablet Take 1 tablet (25 mg total) by mouth every 4 (four) hours as needed for nausea or vomiting. 11/23/13  Yes [name], MD  rifaximin (XIFAXAN) 550 MG TABS tablet Take 1 tablet (550 mg total) by mouth 2 (two) times daily. 06/14/14  Yes [name], MD  spironolactone (ALDACTONE) 25 MG tablet Take 0.5 tablets (12.5 mg total) by mouth 2 (two) times daily. 06/29/14  Yes [name], MD  OXYGEN Inhale 2 L into the lungs continuous.    Historical Provider, MD  Geiger .3CC/29G 29G X 1/2" 0.3 ML MISC  08/16/14   Historical Provider, MD   BP 138/37 mmHg  Pulse 60  Temp(Src) 97.9 F (36.6 C) (Oral)  Resp 15  Ht 5' (1.524 m)  Wt 205 lb (92.987 kg)  BMI 40.04 kg/m2  SpO2 99% Physical Exam Physical Exam  Nursing note and vitals reviewed. Constitutional: Chronically ill appearing, non-toxic, and in no acute distress Head: Normocephalic and atraumatic.  Mouth/Throat: Oropharynx is clear and dry.  Neck: Normal range of motion. Neck supple.  Cardiovascular: Normal rate and regular rhythm.  No edema. Pulmonary/Chest: Effort normal and breath sounds normal. No conversational dyspnea.  Abdominal: Soft. Obese. Colostomy in LLQ, draining blood and light brown stool. Exquisitely tender diffusely, worst in the RLQ. No guarding, reported rebound. Musculoskeletal: No deformities. Neurological: Alert, no facial droop, fluent speech, moves all extremities symmetrically Skin: Skin is warm and dry.  Psychiatric: Cooperative  ED Course  Procedures (including critical care time) Labs Review Labs Reviewed  LIPASE, BLOOD - Abnormal; Notable for the following:    Lipase 60 (*)    All other components within normal limits  COMPREHENSIVE METABOLIC PANEL - Abnormal; Notable for the following:    Potassium 5.3 (*)    BUN 49 (*)    Creatinine, Ser 1.47 (*)    Calcium 11.5 (*)    Total Protein 6.0 (*)    Albumin 3.1 (*)    GFR calc non Af Amer 37 (*)     GFR calc Af Amer 43 (*)    All other components within normal limits  CBC - Abnormal; Notable for the following:    WBC 17.7 (*)    RBC 3.43 (*)    Hemoglobin 9.5 (*)    HCT 30.3 (*)    RDW 26.7 (*)    Platelets 419 (*)    All other components within normal limits  AMMONIA - Abnormal; Notable for the following:    Ammonia 79 (*)    All other components within normal limits  GLUCOSE, CAPILLARY - Abnormal; Notable for the following:    Glucose-Capillary 30 (*)    All other components within normal limits  GLUCOSE, CAPILLARY - Abnormal; Notable for the following:    Glucose-Capillary 30 (*)    All other components within normal limits  GLUCOSE, CAPILLARY - Abnormal; Notable for the following:    Glucose-Capillary 236 (*)    All other components within normal limits  I-STAT TROPOININ, ED - Abnormal; Notable for the following:    Troponin i, poc 0.10 (*)    All other components within normal limits  MRSA PCR SCREENING  URINALYSIS, ROUTINE W REFLEX MICROSCOPIC (NOT AT [facility])  PROTIME-INR  APTT  TROPONIN I  TROPONIN I  TROPONIN I  I-STAT CG4 LACTIC ACID, ED  I-STAT CG4 LACTIC ACID, ED  I-STAT TROPOININ, ED  TYPE AND SCREEN  Imaging Review Dg Chest 2 View  10/03/2014   CLINICAL DATA:  Increased short of breath  EXAM: CHEST  2 VIEW  COMPARISON:  Radiograph 05/19/2014  FINDINGS: LEFT-sided pacemaker overlies normal cardiac silhouette. No effusion, infiltrate, or pneumothorax. No acute osseous abnormality.  IMPRESSION: No acute cardiopulmonary process.   Electronically Signed   By: Suzy Bouchard M.D.   On: 10/03/2014 16:55   Ct Angio Chest Pe W/cm &/or Wo Cm  10/03/2014   CLINICAL DATA:  Right lower quadrant pain. IBS on going and has worsened. Multiple abdominal surgeries. Right lower quadrant pain, fever. Pleuritic chest pain and shortness of breath. History of lung cancer.  EXAM: CT ANGIOGRAPHY CHEST  CT ABDOMEN AND PELVIS WITH CONTRAST  TECHNIQUE: Multidetector CT imaging of the  chest was performed using the standard protocol during bolus administration of intravenous contrast. Multiplanar CT image reconstructions and MIPs were obtained to evaluate the vascular anatomy. Multidetector CT imaging of the abdomen and pelvis was performed using the standard protocol during bolus administration of intravenous contrast.  CONTRAST:  80 cc OMNIPAQUE IOHEXOL 350 MG/ML SOLN  COMPARISON:  None.  FINDINGS: CT CHEST FINDINGS  Heart: Left-sided pacemaker leads to the right atrium and right ventricle. Heart size is normal. No significant coronary artery calcifications. No pericardial effusion.  Vascular structures: Pulmonary arteries are well opacified. There is no acute pulmonary embolus. There is atherosclerotic calcification of the thoracic aorta. No aneurysm.  Mediastinum/thyroid: Posterior to the right lobe of the thyroid there is a low-attenuation mass which measures 2.8 x 3.0 cm. Findings favor a thyroid cyst. Further evaluation with ultrasound is recommended however.  Lungs/Airways: Patient motion artifact degrades the lung windows. No focal consolidations or pleural effusions. Mild scarring in the left lung base.  Chest wall/osseous: Visualized osseous structures have a normal appearance.  CT ABDOMEN AND PELVIS FINDINGS  Upper abdomen: No focal abnormality identified within the liver, adrenal glands, or kidneys. The spleen is absent. Small splenules are identified in the left upper quadrant. Lung the dorsum of the pancreas there is a 1.4 cm low-attenuation lesion like a representing a cyst. Within the pancreatic tail there is a 1.1 cm cystic lesion. Status post cholecystectomy.  Gastrointestinal tract: The stomach has a normal appearance. No evidence for bowel obstruction. Patient has a Hartmann's pouch. Left lower quadrant colostomy. There is a parastomal hernia which contains small bowel. No evidence for associated obstruction. The appendix is surgically absent.  Pelvis: The uterus is surgically  absent. Within the pelvis there is a multiloculated low-attenuation structure which measures 5.3 x 9.7 cm. Collection has increased since previous exam, raising the question of ovarian or peritoneal process. The ovaries are not well seen. There is no free pelvic fluid.  Retroperitoneum: There is dense atherosclerotic calcification of the abdominal aorta. No aneurysm.  Abdominal wall: Small para midline fat containing hernias. Moderate left lower quadrant parastomal small bowel containing hernia.  Osseous structures: Unremarkable.  Review of the MIP images confirms the above findings.  IMPRESSION: 1. Technically adequate exam showing no pulmonary embolus. 2. 3.0 cm low-attenuation lesion posterior to the right thyroid lobe. Thyroid ultrasound is recommended. 3. Absent spleen.  Left upper quadrant splenules. 4. Pancreatic cystic lesions. 5. Hartmann's pouch and left colostomy. Parastomal hernia contains nondilated loops of small bowel. 6. Small anterior wall fat containing hernias. 7. Multiloculated low-attenuation lesion within the pelvis posterior to the bladder suspicious for developing ovarian or peritoneal mass. Less likely similar findings could be seen with abscess. Further evaluation with  ultrasound is recommended. If ultrasound cannot be performed pelvic MRI with contrast is recommended.   Electronically Signed   By: Nolon Nations M.D.   On: 10/03/2014 18:43   Ct Abdomen Pelvis W Contrast  10/03/2014   CLINICAL DATA:  Right lower quadrant pain. IBS on going and has worsened. Multiple abdominal surgeries. Right lower quadrant pain, fever. Pleuritic chest pain and shortness of breath. History of lung cancer.  EXAM: CT ANGIOGRAPHY CHEST  CT ABDOMEN AND PELVIS WITH CONTRAST  TECHNIQUE: Multidetector CT imaging of the chest was performed using the standard protocol during bolus administration of intravenous contrast. Multiplanar CT image reconstructions and MIPs were obtained to evaluate the vascular anatomy.  Multidetector CT imaging of the abdomen and pelvis was performed using the standard protocol during bolus administration of intravenous contrast.  CONTRAST:  80 cc OMNIPAQUE IOHEXOL 350 MG/ML SOLN  COMPARISON:  None.  FINDINGS: CT CHEST FINDINGS  Heart: Left-sided pacemaker leads to the right atrium and right ventricle. Heart size is normal. No significant coronary artery calcifications. No pericardial effusion.  Vascular structures: Pulmonary arteries are well opacified. There is no acute pulmonary embolus. There is atherosclerotic calcification of the thoracic aorta. No aneurysm.  Mediastinum/thyroid: Posterior to the right lobe of the thyroid there is a low-attenuation mass which measures 2.8 x 3.0 cm. Findings favor a thyroid cyst. Further evaluation with ultrasound is recommended however.  Lungs/Airways: Patient motion artifact degrades the lung windows. No focal consolidations or pleural effusions. Mild scarring in the left lung base.  Chest wall/osseous: Visualized osseous structures have a normal appearance.  CT ABDOMEN AND PELVIS FINDINGS  Upper abdomen: No focal abnormality identified within the liver, adrenal glands, or kidneys. The spleen is absent. Small splenules are identified in the left upper quadrant. Lung the dorsum of the pancreas there is a 1.4 cm low-attenuation lesion like a representing a cyst. Within the pancreatic tail there is a 1.1 cm cystic lesion. Status post cholecystectomy.  Gastrointestinal tract: The stomach has a normal appearance. No evidence for bowel obstruction. Patient has a Hartmann's pouch. Left lower quadrant colostomy. There is a parastomal hernia which contains small bowel. No evidence for associated obstruction. The appendix is surgically absent.  Pelvis: The uterus is surgically absent. Within the pelvis there is a multiloculated low-attenuation structure which measures 5.3 x 9.7 cm. Collection has increased since previous exam, raising the question of ovarian or  peritoneal process. The ovaries are not well seen. There is no free pelvic fluid.  Retroperitoneum: There is dense atherosclerotic calcification of the abdominal aorta. No aneurysm.  Abdominal wall: Small para midline fat containing hernias. Moderate left lower quadrant parastomal small bowel containing hernia.  Osseous structures: Unremarkable.  Review of the MIP images confirms the above findings.  IMPRESSION: 1. Technically adequate exam showing no pulmonary embolus. 2. 3.0 cm low-attenuation lesion posterior to the right thyroid lobe. Thyroid ultrasound is recommended. 3. Absent spleen.  Left upper quadrant splenules. 4. Pancreatic cystic lesions. 5. Hartmann's pouch and left colostomy. Parastomal hernia contains nondilated loops of small bowel. 6. Small anterior wall fat containing hernias. 7. Multiloculated low-attenuation lesion within the pelvis posterior to the bladder suspicious for developing ovarian or peritoneal mass. Less likely similar findings could be seen with abscess. Further evaluation with ultrasound is recommended. If ultrasound cannot be performed pelvic MRI with contrast is recommended.   Electronically Signed   By: Nolon Nations M.D.   On: 10/03/2014 18:43   I have personally reviewed and evaluated these images  and lab results as part of my medical decision-making.   EKG Interpretation   Date/Time:  Monday October 03 2014 15:36:28 EDT Ventricular Rate:  77 PR Interval:  110 QRS Duration: 110 QT Interval:  372 QTC Calculation: 421 R Axis:     Text Interpretation:  A-V dual-paced complexes w/ some inhibition ST  depression in interior leads Confirmed by LIU MD, DANA [id number] on 10/03/2014  4:36:01 PM      MDM   Final diagnoses:  Pelvic mass    64 year old female with multiple medical problems and abdominal surgeries who presents with abdominal pain, low-grade fevers, and chest pain. She is chronically ill-appearing on presentation, but is nontoxic and in no acute  distress. She is afebrile, and vital signs are stable. She has a colostomy in the left lower quadrant draining brown stool with bright red blood, which she reports is not unusual for her. She has a diffusely tender abdomen, worse in the right lower quadrant. Cardiopulmonary exam is unremarkable.   There is concern for acute intra-abdominal process given her exam and history, and CT abdomen pelvis was performed. This shows enlarging mass within her pelvis, that seems consistent with known ovarian mass, likely causing her symptoms today. No other acute infectious or acute surgical intra-abdominal processes noted. Given her prior history of malignancy, with pleuritic chest pain, and mild troponin leak, there was also concern for possible PE. CTA of her chest was also performed showing no acute thoracic processes. In regards to her troponin leak at 0.10, initial EKG noted mild ST depression in the inferior leads. She has active GI bleed, felt not a good candidate for heparin or aspirin.   This is discussed with [facility] hospitalist, and admitted for ongoing management.   Forde Dandy, MD 10/04/14 [phone]

## 2014-10-03 NOTE — H&P (Addendum)
[facility] History and Physical  [name] ZDG:[id number] DOB: [birth year] DOA: 10/03/2014  Referring physician: EDP PCP: [name], MD   Chief Complaint: Abdominal pain   HPI: [name] is a 64 y.o. female with numerous medical problems including multiple abdominal surgeries, pelvic mass, ongoing GIB bloody output from ostomy since earlier this year, CAD.  Patient presents to the ED with 1 week history of abdominal pain.  Seen by PCP who put her on cipro/flagyl.  Pain has since worsened and now she has productive cough with brown sputum and pleuritic left sided chest pain.  Review of Systems: Systems reviewed.  As above, otherwise negative  Past Medical History  Diagnosis Date  . Cirrhosis   . GERD (gastroesophageal reflux disease)   . Cervical disc syndrome     trouble turnng neck at times  . Chronic respiratory failure   . Diverticulitis of colon   . Perforation of colon   . IBS (irritable bowel syndrome)   . Chronic lower GI bleeding   . Overactive bladder   . Thrombocytopenia     sees Dr. Julien Nordmann   . Splenomegaly   . Depression   . Hypertension   . Asthma   . Heart murmur   . Hyperlipidemia   . COPD (chronic obstructive pulmonary disease)     sees Dr. Gwenette Greet   . Colostomy care   . Hypercalcemia   . CHF (congestive heart failure)   . Pacemaker 12/14/2013  . 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 Cervical cancer many years ago  . Complication of anesthesia     woke up during colonscopy and endoscopy in past  . History of blood transfusion "several"    "bleeding via ostomy" (01/04/2014)  . On home oxygen therapy     "2L; 24/7" (01/04/2014)  . Pneumonia "1-2 times"  . Chronic bronchitis "several times"  . Sleep apnea 2012    mild, no cpap needed  . Type II diabetes mellitus     sees Dr. Cruzita Lederer   . Chronic disease anemia     sees Dr. Julien Nordmann, due to chronic disease and GI losses   . History of stomach ulcers   .  Migraine     "last one was several years ago" (01/04/2014)  . Arthritis     "tailbone; hands; legs" (01/04/2014)  . Anxiety   . Pericardial effusion 2007, 2015.   [name] Chronic abdominal pain     IBS  . Cirrhosis of liver     stage 4  . 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  Patient has mild copd.       Past Surgical History  Procedure Laterality Date  . Colostomy  11/06/2005  . Lung removal, partial Left 2004    upper lobe removed  . Left colectomy  11/06/2005    Hartmann resection of sigmoid colon and end colostomy.  . Esophagogastroduodenoscopy  03/19/2011    Procedure: ESOPHAGOGASTRODUODENOSCOPY (EGD);  Surgeon: [name], MD;  Location: [facility];  Service: Endoscopy;  Laterality: N/A;  . Colonoscopy  03/19/2011    Procedure: COLONOSCOPY;  Surgeon: [name], MD;  Location: [facility];  Service: Endoscopy;  Laterality: N/A;  . Givens capsule study  03/20/2011    Procedure: GIVENS CAPSULE STUDY;  Surgeon: [name], MD;  Location: [facility];  Service: Endoscopy;  Laterality: N/A;  . Small bowel obstruction repair  March 2012  . Umbilical hernia repair  March 2012  . Splenectomy, total N/A 02/24/2013    Procedure: SPLENECTOMY;  Surgeon: Harl Bowie, MD;  Location: [place];  Service: General;  Laterality: N/A;  . Orif patella Left 04/21/2013    Procedure: OPEN REDUCTION INTERNAL (ORIF) FIXATION PATELLA;  Surgeon: Augustin Schooling, MD;  Location: [place];  Service: Orthopedics;  Laterality: Left;  . Pacemaker insertion  2015  . Esophagogastroduodenoscopy (egd) with propofol N/A 11/02/2013    Procedure: ESOPHAGOGASTRODUODENOSCOPY (EGD) WITH PROPOFOL;  Surgeon: [name], MD;  Location: [facility];  Service: Endoscopy;  Laterality: N/A;  EGD with APC  . Hot hemostasis N/A 11/02/2013    Procedure: HOT HEMOSTASIS (ARGON PLASMA  COAGULATION/BICAP);  Surgeon: [name], MD;  Location: [facility];  Service: Endoscopy;  Laterality: N/A;  . Colon surgery    . Hernia repair    . Fracture surgery    . Abdominal hysterectomy    . Tubal ligation    . Tonsillectomy  1959  . Appendectomy  1962  . Cholecystectomy    . Cardiac catheterization  1967  . Permanent pacemaker insertion N/A 09/07/2013    Procedure: PERMANENT PACEMAKER INSERTION;  Surgeon: Evans Lance, MD;  Location: [facility];  Service: Cardiovascular;  Laterality: N/A;  . Tee without cardioversion N/A 01/06/2014    Procedure: TRANSESOPHAGEAL ECHOCARDIOGRAM (TEE);  Surgeon: Lelon Perla, MD;  Location: [facility] ENDOS[facility];  Service: Cardiovascular;  Laterality: N/A;  . Colostomy  11/06/2005    OPERATIVE REPORT   Social History:  reports that she quit smoking about 12 years ago. Her smoking use included Cigarettes. She has a 70 pack-year smoking history. She has never used smokeless tobacco. She reports that she does not drink alcohol or use illicit drugs.  Allergies  Allergen Reactions  . Acetaminophen Other (See Comments)    Cirrhosis of liver  . Morphine Other (See Comments)    REACTION: Lowers BP  . Morphine And Related Other (See Comments)    Blood pressure drops   . Other Other (See Comments)    AGENT:  Per pt, CANNOT TAKE ANY FORM OF BLOOD THINNER, due to cirrhosis of the liver  . Penicillins Anaphylaxis and Rash  . Trazodone And Nefazodone Other (See Comments)    Cardiac arrythmia - DO NOT USE  . Codeine Phosphate Other (See Comments)    REACTION: Stomach cramps  . Hydrocodone-Acetaminophen Other (See Comments)    REACTION: hallucinations  . Cephalexin Swelling and Rash  . Hydrocodone-Acetaminophen Other (See Comments)    Can't have because of Tylenol bc of cirrhosis    Family History  Problem Relation Age of Onset  . Coronary artery disease    . Diabetes type II    . Heart attack Mother   . Diabetes type II Mother   . Cirrhosis  Father   . Heart attack Sister   . Pancreatic cancer Mother     secondary from surgery  . Cervical cancer Maternal Grandmother   . Hypertension Father   . Hypertension Sister   . Hypertension Son   . Hypertension Daughter   . Stroke Neg Hx      Prior to Admission medications   Medication Sig Start Date End Date Taking? Authorizing Provider  albuterol (PROVENTIL) (2.5 MG/3ML) 0.083% nebulizer solution Take 3 mLs (2.5 mg total) by nebulization every 4 (four) hours as needed for wheezing or  shortness of breath. 03/02/14  Yes [name], MD  albuterol-ipratropium (COMBIVENT) 18-103 MCG/ACT inhaler Inhale 2 puffs into the lungs every 4 (four) hours as needed for wheezing or shortness of breath.    Yes Historical Provider, MD  atorvastatin (LIPITOR) 20 MG tablet Take 1 tablet (20 mg total) by mouth every morning. 03/02/14  Yes [name], MD  ciprofloxacin (CIPRO) 500 MG tablet Take 1 tablet (500 mg total) by mouth 2 (two) times daily. 09/28/14  Yes [name], MD  diazepam (VALIUM) 5 MG tablet Take 5 mg by mouth every 6 (six) hours as needed for anxiety.  08/15/14  Yes Historical Provider, MD  dicyclomine (BENTYL) 10 MG capsule Take 1 capsule (10 mg total) by mouth 3 (three) times daily as needed for spasms. 05/23/14  Yes Domenic Polite, MD  esomeprazole (NEXIUM) 40 MG capsule Take 40 mg by mouth at bedtime.  03/02/14  Yes Historical Provider, MD  Fluticasone-Salmeterol (ADVAIR) 250-50 MCG/DOSE AEPB Inhale 1 puff into the lungs every 12 (twelve) hours. 03/02/14  Yes [name], MD  folic acid (FOLVITE) 1 MG tablet TAKE ONE TABLET BY MOUTH ONCE DAILY IN THE EVENING 05/10/14  Yes [name], MD  furosemide (LASIX) 40 MG tablet Take 3 tablets (120 mg total) by mouth 2 (two) times daily. 07/04/14  Yes [name], MD  HUMULIN R 500 UNIT/ML injection Inject 33 units before breakfast, 33 units before lunch and 30units before dinner daily as instructed. Patient taking differently: Inject 28-35 Units  into the skin 3 (three) times daily with meals. Inject 35 units before breakfast, 28 units before lunch and 35 units before dinner daily as instructed. 09/12/14  Yes Philemon Kingdom, MD  hydrALAZINE (APRESOLINE) 25 MG tablet Take 1 tablet (25 mg total) by mouth 2 (two) times daily. 06/29/14  Yes [name], MD  isosorbide dinitrate (ISORDIL) 20 MG tablet Take 1 tablet (20 mg total) by mouth 2 (two) times daily. 07/26/14  Yes Troy Sine, MD  lactulose (CHRONULAC) 10 GM/15ML solution Take 15 mLs (10 g total) by mouth 3 (three) times daily. Patient taking differently: Take 10 g by mouth 4 (four) times daily.  06/29/14  Yes [name], MD  losartan (COZAAR) 50 MG tablet Take 50 mg by mouth daily.  08/15/14  Yes Historical Provider, MD  metoCLOPramide (REGLAN) 10 MG tablet Take 1 tablet (10 mg total) by mouth 3 (three) times daily. 07/26/14  Yes [name], MD  metroNIDAZOLE (FLAGYL) 500 MG tablet Take 1 tablet (500 mg total) by mouth 3 (three) times daily. 09/28/14  Yes [name], MD  Oxycodone HCl 20 MG TABS Take 1 tablet (20 mg total) by mouth every 3 (three) hours as needed (pain). 06/29/14  Yes [name], MD  polyethylene glycol powder (GLYCOLAX/MIRALAX) powder Take 17 g by mouth 2 (two) times daily as needed. Patient taking differently: Take 17 g by mouth 2 (two) times daily as needed for mild constipation.  08/03/14  Yes [name], MD  potassium chloride (KLOR-CON 10) 10 MEQ tablet Take 1 tablet (10 mEq total) by mouth daily. 03/16/14  Yes [name], MD  promethazine (PHENERGAN) 25 MG tablet Take 1 tablet (25 mg total) by mouth every 4 (four) hours as needed for nausea or vomiting. 11/23/13  Yes [name], MD  rifaximin (XIFAXAN) 550 MG TABS tablet Take 1 tablet (550 mg total) by mouth 2 (two) times daily. 06/14/14  Yes [name], MD  spironolactone (ALDACTONE) 25 MG tablet Take 0.5 tablets (12.5 mg total) by mouth 2 (two) times daily. 06/29/14  Yes [name], MD  OXYGEN  Inhale 2 L into the lungs continuous.    Historical Provider, MD  [place] .3CC/29G 29G X 1/2" 0.3 ML MISC  08/16/14   Historical Provider, MD   Physical Exam: Filed Vitals:   10/03/14 1945  BP: 122/32  Pulse: 64  Temp:   Resp: 17    BP 122/32 mmHg  Pulse 64  Temp(Src) 98.8 F (37.1 C) (Oral)  Resp 17  Ht 5' (1.524 m)  Wt 92.987 kg (205 lb)  BMI 40.04 kg/m2  SpO2 97%  General Appearance:    Alert, oriented, no distress, appears stated age  Head:    Normocephalic, atraumatic  Eyes:    PERRL, EOMI, sclera non-icteric        Nose:   Nares without drainage or epistaxis. Mucosa, turbinates normal  Throat:   Moist mucous membranes. Oropharynx without erythema or exudate.  Neck:   Supple. No carotid bruits.  No thyromegaly.  No lymphadenopathy.   Back:     No CVA tenderness, no spinal tenderness  Lungs:     Clear to auscultation bilaterally, without wheezes, rhonchi or rales  Chest wall:    No tenderness to palpitation  Heart:    Regular rate and rhythm without murmurs, gallops, rubs  Abdomen:     Soft, non-tender, nondistended, normal bowel sounds, no organomegaly  Genitalia:    deferred  Rectal:    deferred  Extremities:   No clubbing, cyanosis or edema.  Pulses:   2+ and symmetric all extremities  Skin:   Skin color, texture, turgor normal, no rashes or lesions  Lymph nodes:   Cervical, supraclavicular, and axillary nodes normal  Neurologic:   CNII-XII intact. Normal strength, sensation and reflexes      throughout    Labs on Admission:  Basic Metabolic Panel:  Recent Labs Lab 10/03/14 1608  NA 135  K 5.3*  CL 105  CO2 22  GLUCOSE 76  BUN 49*  CREATININE 1.47*  CALCIUM 11.5*   Liver Function Tests:  Recent Labs Lab 10/03/14 1608  AST 39  ALT 39  ALKPHOS 60  BILITOT 0.3  PROT 6.0*  ALBUMIN 3.1*    Recent Labs Lab 10/03/14 1608  LIPASE 60*    Recent Labs Lab 10/03/14 1602  AMMONIA 79*   CBC:  Recent Labs Lab 10/03/14 1608  WBC  17.7*  HGB 9.5*  HCT 30.3*  MCV 88.3  PLT 419*   Cardiac Enzymes: No results for input(s): CKTOTAL, CKMB, CKMBINDEX, TROPONINI in the last 168 hours.  BNP (last 3 results)  Recent Labs  03/30/14 1230  PROBNP 71.0   CBG: No results for input(s): GLUCAP in the last 168 hours.  Radiological Exams on Admission: Dg Chest 2 View  10/03/2014   CLINICAL DATA:  Increased short of breath  EXAM: CHEST  2 VIEW  COMPARISON:  Radiograph 05/19/2014  FINDINGS: LEFT-sided pacemaker overlies normal cardiac silhouette. No effusion, infiltrate, or pneumothorax. No acute osseous abnormality.  IMPRESSION: No acute cardiopulmonary process.   Electronically Signed   By: Suzy Bouchard M.D.   On: 10/03/2014 16:55   Ct Angio Chest Pe W/cm &/or Wo Cm  10/03/2014   CLINICAL DATA:  Right lower quadrant pain. IBS on going and has worsened. Multiple abdominal surgeries. Right lower quadrant pain, fever. Pleuritic chest pain  and shortness of breath. History of lung cancer.  EXAM: CT ANGIOGRAPHY CHEST  CT ABDOMEN AND PELVIS WITH CONTRAST  TECHNIQUE: Multidetector CT imaging of the chest was performed using the standard protocol during bolus administration of intravenous contrast. Multiplanar CT image reconstructions and MIPs were obtained to evaluate the vascular anatomy. Multidetector CT imaging of the abdomen and pelvis was performed using the standard protocol during bolus administration of intravenous contrast.  CONTRAST:  80 cc OMNIPAQUE IOHEXOL 350 MG/ML SOLN  COMPARISON:  None.  FINDINGS: CT CHEST FINDINGS  Heart: Left-sided pacemaker leads to the right atrium and right ventricle. Heart size is normal. No significant coronary artery calcifications. No pericardial effusion.  Vascular structures: Pulmonary arteries are well opacified. There is no acute pulmonary embolus. There is atherosclerotic calcification of the thoracic aorta. No aneurysm.  Mediastinum/thyroid: Posterior to the right lobe of the thyroid there is a  low-attenuation mass which measures 2.8 x 3.0 cm. Findings favor a thyroid cyst. Further evaluation with ultrasound is recommended however.  Lungs/Airways: Patient motion artifact degrades the lung windows. No focal consolidations or pleural effusions. Mild scarring in the left lung base.  Chest wall/osseous: Visualized osseous structures have a normal appearance.  CT ABDOMEN AND PELVIS FINDINGS  Upper abdomen: No focal abnormality identified within the liver, adrenal glands, or kidneys. The spleen is absent. Small splenules are identified in the left upper quadrant. Lung the dorsum of the pancreas there is a 1.4 cm low-attenuation lesion like a representing a cyst. Within the pancreatic tail there is a 1.1 cm cystic lesion. Status post cholecystectomy.  Gastrointestinal tract: The stomach has a normal appearance. No evidence for bowel obstruction. Patient has a Hartmann's pouch. Left lower quadrant colostomy. There is a parastomal hernia which contains small bowel. No evidence for associated obstruction. The appendix is surgically absent.  Pelvis: The uterus is surgically absent. Within the pelvis there is a multiloculated low-attenuation structure which measures 5.3 x 9.7 cm. Collection has increased since previous exam, raising the question of ovarian or peritoneal process. The ovaries are not well seen. There is no free pelvic fluid.  Retroperitoneum: There is dense atherosclerotic calcification of the abdominal aorta. No aneurysm.  Abdominal wall: Small para midline fat containing hernias. Moderate left lower quadrant parastomal small bowel containing hernia.  Osseous structures: Unremarkable.  Review of the MIP images confirms the above findings.  IMPRESSION: 1. Technically adequate exam showing no pulmonary embolus. 2. 3.0 cm low-attenuation lesion posterior to the right thyroid lobe. Thyroid ultrasound is recommended. 3. Absent spleen.  Left upper quadrant splenules. 4. Pancreatic cystic lesions. 5.  Hartmann's pouch and left colostomy. Parastomal hernia contains nondilated loops of small bowel. 6. Small anterior wall fat containing hernias. 7. Multiloculated low-attenuation lesion within the pelvis posterior to the bladder suspicious for developing ovarian or peritoneal mass. Less likely similar findings could be seen with abscess. Further evaluation with ultrasound is recommended. If ultrasound cannot be performed pelvic MRI with contrast is recommended.   Electronically Signed   By: Nolon Nations M.D.   On: 10/03/2014 18:43   Ct Abdomen Pelvis W Contrast  10/03/2014   CLINICAL DATA:  Right lower quadrant pain. IBS on going and has worsened. Multiple abdominal surgeries. Right lower quadrant pain, fever. Pleuritic chest pain and shortness of breath. History of lung cancer.  EXAM: CT ANGIOGRAPHY CHEST  CT ABDOMEN AND PELVIS WITH CONTRAST  TECHNIQUE: Multidetector CT imaging of the chest was performed using the standard protocol during bolus administration of intravenous contrast.  Multiplanar CT image reconstructions and MIPs were obtained to evaluate the vascular anatomy. Multidetector CT imaging of the abdomen and pelvis was performed using the standard protocol during bolus administration of intravenous contrast.  CONTRAST:  80 cc OMNIPAQUE IOHEXOL 350 MG/ML SOLN  COMPARISON:  None.  FINDINGS: CT CHEST FINDINGS  Heart: Left-sided pacemaker leads to the right atrium and right ventricle. Heart size is normal. No significant coronary artery calcifications. No pericardial effusion.  Vascular structures: Pulmonary arteries are well opacified. There is no acute pulmonary embolus. There is atherosclerotic calcification of the thoracic aorta. No aneurysm.  Mediastinum/thyroid: Posterior to the right lobe of the thyroid there is a low-attenuation mass which measures 2.8 x 3.0 cm. Findings favor a thyroid cyst. Further evaluation with ultrasound is recommended however.  Lungs/Airways: Patient motion artifact  degrades the lung windows. No focal consolidations or pleural effusions. Mild scarring in the left lung base.  Chest wall/osseous: Visualized osseous structures have a normal appearance.  CT ABDOMEN AND PELVIS FINDINGS  Upper abdomen: No focal abnormality identified within the liver, adrenal glands, or kidneys. The spleen is absent. Small splenules are identified in the left upper quadrant. Lung the dorsum of the pancreas there is a 1.4 cm low-attenuation lesion like a representing a cyst. Within the pancreatic tail there is a 1.1 cm cystic lesion. Status post cholecystectomy.  Gastrointestinal tract: The stomach has a normal appearance. No evidence for bowel obstruction. Patient has a Hartmann's pouch. Left lower quadrant colostomy. There is a parastomal hernia which contains small bowel. No evidence for associated obstruction. The appendix is surgically absent.  Pelvis: The uterus is surgically absent. Within the pelvis there is a multiloculated low-attenuation structure which measures 5.3 x 9.7 cm. Collection has increased since previous exam, raising the question of ovarian or peritoneal process. The ovaries are not well seen. There is no free pelvic fluid.  Retroperitoneum: There is dense atherosclerotic calcification of the abdominal aorta. No aneurysm.  Abdominal wall: Small para midline fat containing hernias. Moderate left lower quadrant parastomal small bowel containing hernia.  Osseous structures: Unremarkable.  Review of the MIP images confirms the above findings.  IMPRESSION: 1. Technically adequate exam showing no pulmonary embolus. 2. 3.0 cm low-attenuation lesion posterior to the right thyroid lobe. Thyroid ultrasound is recommended. 3. Absent spleen.  Left upper quadrant splenules. 4. Pancreatic cystic lesions. 5. Hartmann's pouch and left colostomy. Parastomal hernia contains nondilated loops of small bowel. 6. Small anterior wall fat containing hernias. 7. Multiloculated low-attenuation lesion  within the pelvis posterior to the bladder suspicious for developing ovarian or peritoneal mass. Less likely similar findings could be seen with abscess. Further evaluation with ultrasound is recommended. If ultrasound cannot be performed pelvic MRI with contrast is recommended.   Electronically Signed   By: Nolon Nations M.D.   On: 10/03/2014 18:43    EKG: Independently reviewed.  Assessment/Plan Principal Problem:   Abdominal pain, generalized Active Problems:   Chest pain   Anemia due to chronic blood loss   Hyperammonemia   Chronic diastolic CHF (congestive heart failure), NYHA class 4   Chronic GI bleeding   Pelvic mass in female   1. Abdominal pain, generalized - 1. Concerning that this could be related to her growing pelvic mass 2. Will treat with narcotics 3. [place] of mass is pending 4. May wish to biopsy to see if palliative radiation is a possibility 5. May wish palliative care consult for symptom management. 2. Chest pain - with slight troponin elevation of  0.1 1. Will trend troponins but 2. Given chronic and ongoing GIB patient is not a candidate for anticoagulation at this time including ASA, plavix, heparin, etc.  Thus it is unlikely that acute PCI would be possible on this patient. 3. If trops trend up may wish to give cards a call but I doubt they will have anything else to add here. 4. As such, will go ahead and let her eat since a heart cath is almost certainly not in the works for this patient this evening. 3. Chronic GIB - 1. Ongoing BRB output from ostomy including today 2. She is actually seeing heme-onc for this and getting epogyn injections 3. Does not seem to be anything that GI can do about it (see prior GI notes). 4. Chronic diastolic CHF - 1. Holding potassium and spironolactone due to mild hyperkalemia today 2. Repeat BMP in AM 3. Continue lasix 5. DM - 1. Hold home insulin 2. Putting patient on lantus 10 units QHS 3. And SSI AC/HS moderate  dose 4. With 6 units mealtime coverage 5. May need to up this significantly though, looks like she takes 98 units of humulin R a day at home. 6. Hyperammonemia - continue lactulose scheduled 7. Thyroid nodule - may wish to get [place] as outpatient but at this point this is lower on the list of her problems    Code Status: Full Code  Family Communication: No family in room Disposition Plan: Admit to inpatient   Time spent: 70 min  GARDNER, JARED M. [facility] Pager [phone]  If 7AM-7PM, please contact the day team taking care of the patient [email or web address] Password [email or web address] 10/03/2014, 8:34 PM

## 2014-10-04 ENCOUNTER — Inpatient Hospital Stay (HOSPITAL_COMMUNITY): Payer: Medicare Other

## 2014-10-04 LAB — GLUCOSE, CAPILLARY
Glucose-Capillary: 134 mg/dL — ABNORMAL HIGH (ref 65–99)
Glucose-Capillary: 127 mg/dL — ABNORMAL HIGH (ref 65–99)
Glucose-Capillary: 228 mg/dL — ABNORMAL HIGH (ref 65–99)
Glucose-Capillary: 226 mg/dL — ABNORMAL HIGH (ref 65–99)
Glucose-Capillary: 196 mg/dL — ABNORMAL HIGH (ref 65–99)

## 2014-10-04 LAB — TROPONIN I
TROPONIN I: 0.04 ng/mL — AB (ref <0.031)
Troponin I: 0.06 ng/mL — ABNORMAL HIGH (ref <0.031)
Troponin I: 0.05 ng/mL — ABNORMAL HIGH (ref <0.031)

## 2014-10-04 LAB — MRSA PCR SCREENING: MRSA BY PCR: NEGATIVE

## 2014-10-04 MED ORDER — CETYLPYRIDINIUM CHLORIDE 0.05 % MT LIQD
7.0000 mL | Freq: Two times a day (BID) | OROMUCOSAL | Status: DC
  Administered 2014-10-04 – 2014-10-09 (×11): 7 mL via OROMUCOSAL

## 2014-10-04 MED ORDER — INSULIN GLARGINE 100 UNIT/ML ~~LOC~~ SOLN
20.0000 [IU] | Freq: Every day | SUBCUTANEOUS | Status: DC
  Administered 2014-10-04 – 2014-10-08 (×5): 20 [IU] via SUBCUTANEOUS
  Filled 2014-10-04 (×6): qty 0.2

## 2014-10-04 MED ORDER — LACTULOSE 10 GM/15ML PO SOLN
10.0000 g | Freq: Two times a day (BID) | ORAL | Status: DC
  Administered 2014-10-05 – 2014-10-09 (×9): 10 g via ORAL
  Filled 2014-10-04 (×9): qty 15

## 2014-10-04 NOTE — Progress Notes (Signed)
4+ pitting edema noted in both lower extremities; indentation does not resolve after 2 minutes. Spoke with Dr. Alcario Drought, admitting MD, re: VTE prophylaxis. Per MD, patient does not meet criteria for SCD's, TED hose or pharmaceutical anticoagulation at this time, related to her chronic lower extremity edema and chronic bleeding from colostomy. Also discussed possibility of passive ROM per physical therapy during the day. Dr. Alcario Drought stated would likely not prevent VTE in this patient, but that it was fine to order for the patient.  Patient has numerous risk factors for VTE. Will continue to monitor closely.

## 2014-10-04 NOTE — Progress Notes (Signed)
Utilization review completed. Eusebia Grulke, RN, BSN. 

## 2014-10-04 NOTE — Progress Notes (Signed)
PROGRESS NOTE  [name] RKY:[id number] DOB: 03/13/1950 DOA: 10/03/2014 PCP: [name], MD  Assessment/Plan: 1. Abdominal pain, generalized - 1. Concerning that this could be related to her growing pelvic mass- was seen by Dr. Denman George in the past 2. Patient requesting IV dilaudid 3. [place] of mass is pending 4. May wish to biopsy to see if palliative radiation is a possibility  2. Chest pain - with slight troponin elevation of 0.1 1. Trending down  2. Given chronic and ongoing GIB patient is not a candidate for anticoagulation at this time including ASA, plavix, heparin, etc. Thus it is unlikely that acute PCI would be possible on this patient.  3. Chronic GIB - 1. She is actually seeing heme-onc for this and getting epogyn injections 2. Does not seem to be anything that GI can do about it (see prior GI notes).  4. Chronic diastolic CHF - 1. Holding potassium and spironolactone due to mild hyperkalemia today 2. Repeat BMP in AM 3. Continue lasix  5. DM - 1. And SSI AC/HS moderate dose 2. With 6 units mealtime coverage 3. Increase lantus  6. Hyperammonemia - continue lactulose scheduled  7. Thyroid nodule - check TSH -U/S as outpatient   Code Status: full Family Communication:  Disposition Plan:    Consultants:    Procedures:      HPI/Subjective: C/o right sided pain  Objective: Filed Vitals:   10/04/14 1137  BP: 148/47  Pulse: 63  Temp:   Resp: 13    Intake/Output Summary (Last 24 hours) at 10/04/14 1231 Last data filed at 10/04/14 1119  Gross per 24 hour  Intake    120 ml  Output   3700 ml  Net  -3580 ml   Filed Weights   10/03/14 1543 10/04/14 0436  Weight: 92.987 kg (205 lb) 90.81 kg (200 lb 3.2 oz)    Exam:   General:  Awake, NAD  Cardiovascular: rrr  Respiratory: clear  Abdomen: +BS, soft  Musculoskeletal: no edema   Data Reviewed: Basic Metabolic Panel:  Recent Labs Lab 10/03/14 1608  NA 135  K 5.3*  CL 105    CO2 22  GLUCOSE 76  BUN 49*  CREATININE 1.47*  CALCIUM 11.5*   Liver Function Tests:  Recent Labs Lab 10/03/14 1608  AST 39  ALT 39  ALKPHOS 60  BILITOT 0.3  PROT 6.0*  ALBUMIN 3.1*    Recent Labs Lab 10/03/14 1608  LIPASE 60*    Recent Labs Lab 10/03/14 1602  AMMONIA 79*   CBC:  Recent Labs Lab 10/03/14 1608  WBC 17.7*  HGB 9.5*  HCT 30.3*  MCV 88.3  PLT 419*   Cardiac Enzymes:  Recent Labs Lab 10/04/14 0136 10/04/14 0741  TROPONINI 0.06* 0.05*   BNP (last 3 results)  Recent Labs  04/12/14 0710 05/15/14 2322 05/21/14 0427  BNP 311.9* 918.6* 120.7*    ProBNP (last 3 results)  Recent Labs  03/30/14 1230  PROBNP 71.0    CBG:  Recent Labs Lab 10/03/14 2123 10/03/14 2327 10/04/14 0433 10/04/14 0712 10/04/14 1116  GLUCAP 30* 236* 134* 127* 228*    Recent Results (from the past 240 hour(s))  MRSA PCR Screening     Status: None   Collection Time: 10/03/14 10:48 PM  Result Value Ref Range Status   MRSA by PCR NEGATIVE NEGATIVE Final    Comment:        The GeneXpert MRSA Assay (FDA approved for NASAL specimens only), is one component  of a comprehensive MRSA colonization surveillance program. It is not intended to diagnose MRSA infection nor to guide or monitor treatment for MRSA infections.      Studies: Dg Chest 2 View  10/03/2014   CLINICAL DATA:  Increased short of breath  EXAM: CHEST  2 VIEW  COMPARISON:  Radiograph 05/19/2014  FINDINGS: LEFT-sided pacemaker overlies normal cardiac silhouette. No effusion, infiltrate, or pneumothorax. No acute osseous abnormality.  IMPRESSION: No acute cardiopulmonary process.   Electronically Signed   By: Suzy Bouchard M.D.   On: 10/03/2014 16:55   Ct Angio Chest Pe W/cm &/or Wo Cm  10/03/2014   CLINICAL DATA:  Right lower quadrant pain. IBS on going and has worsened. Multiple abdominal surgeries. Right lower quadrant pain, fever. Pleuritic chest pain and shortness of breath. History  of lung cancer.  EXAM: CT ANGIOGRAPHY CHEST  CT ABDOMEN AND PELVIS WITH CONTRAST  TECHNIQUE: Multidetector CT imaging of the chest was performed using the standard protocol during bolus administration of intravenous contrast. Multiplanar CT image reconstructions and MIPs were obtained to evaluate the vascular anatomy. Multidetector CT imaging of the abdomen and pelvis was performed using the standard protocol during bolus administration of intravenous contrast.  CONTRAST:  80 cc OMNIPAQUE IOHEXOL 350 MG/ML SOLN  COMPARISON:  None.  FINDINGS: CT CHEST FINDINGS  Heart: Left-sided pacemaker leads to the right atrium and right ventricle. Heart size is normal. No significant coronary artery calcifications. No pericardial effusion.  Vascular structures: Pulmonary arteries are well opacified. There is no acute pulmonary embolus. There is atherosclerotic calcification of the thoracic aorta. No aneurysm.  Mediastinum/thyroid: Posterior to the right lobe of the thyroid there is a low-attenuation mass which measures 2.8 x 3.0 cm. Findings favor a thyroid cyst. Further evaluation with ultrasound is recommended however.  Lungs/Airways: Patient motion artifact degrades the lung windows. No focal consolidations or pleural effusions. Mild scarring in the left lung base.  Chest wall/osseous: Visualized osseous structures have a normal appearance.  CT ABDOMEN AND PELVIS FINDINGS  Upper abdomen: No focal abnormality identified within the liver, adrenal glands, or kidneys. The spleen is absent. Small splenules are identified in the left upper quadrant. Lung the dorsum of the pancreas there is a 1.4 cm low-attenuation lesion like a representing a cyst. Within the pancreatic tail there is a 1.1 cm cystic lesion. Status post cholecystectomy.  Gastrointestinal tract: The stomach has a normal appearance. No evidence for bowel obstruction. Patient has a Hartmann's pouch. Left lower quadrant colostomy. There is a parastomal hernia which  contains small bowel. No evidence for associated obstruction. The appendix is surgically absent.  Pelvis: The uterus is surgically absent. Within the pelvis there is a multiloculated low-attenuation structure which measures 5.3 x 9.7 cm. Collection has increased since previous exam, raising the question of ovarian or peritoneal process. The ovaries are not well seen. There is no free pelvic fluid.  Retroperitoneum: There is dense atherosclerotic calcification of the abdominal aorta. No aneurysm.  Abdominal wall: Small para midline fat containing hernias. Moderate left lower quadrant parastomal small bowel containing hernia.  Osseous structures: Unremarkable.  Review of the MIP images confirms the above findings.  IMPRESSION: 1. Technically adequate exam showing no pulmonary embolus. 2. 3.0 cm low-attenuation lesion posterior to the right thyroid lobe. Thyroid ultrasound is recommended. 3. Absent spleen.  Left upper quadrant splenules. 4. Pancreatic cystic lesions. 5. Hartmann's pouch and left colostomy. Parastomal hernia contains nondilated loops of small bowel. 6. Small anterior wall fat containing hernias. 7. Multiloculated  low-attenuation lesion within the pelvis posterior to the bladder suspicious for developing ovarian or peritoneal mass. Less likely similar findings could be seen with abscess. Further evaluation with ultrasound is recommended. If ultrasound cannot be performed pelvic MRI with contrast is recommended.   Electronically Signed   By: Nolon Nations M.D.   On: 10/03/2014 18:43   Ct Abdomen Pelvis W Contrast  10/03/2014   CLINICAL DATA:  Right lower quadrant pain. IBS on going and has worsened. Multiple abdominal surgeries. Right lower quadrant pain, fever. Pleuritic chest pain and shortness of breath. History of lung cancer.  EXAM: CT ANGIOGRAPHY CHEST  CT ABDOMEN AND PELVIS WITH CONTRAST  TECHNIQUE: Multidetector CT imaging of the chest was performed using the standard protocol during bolus  administration of intravenous contrast. Multiplanar CT image reconstructions and MIPs were obtained to evaluate the vascular anatomy. Multidetector CT imaging of the abdomen and pelvis was performed using the standard protocol during bolus administration of intravenous contrast.  CONTRAST:  80 cc OMNIPAQUE IOHEXOL 350 MG/ML SOLN  COMPARISON:  None.  FINDINGS: CT CHEST FINDINGS  Heart: Left-sided pacemaker leads to the right atrium and right ventricle. Heart size is normal. No significant coronary artery calcifications. No pericardial effusion.  Vascular structures: Pulmonary arteries are well opacified. There is no acute pulmonary embolus. There is atherosclerotic calcification of the thoracic aorta. No aneurysm.  Mediastinum/thyroid: Posterior to the right lobe of the thyroid there is a low-attenuation mass which measures 2.8 x 3.0 cm. Findings favor a thyroid cyst. Further evaluation with ultrasound is recommended however.  Lungs/Airways: Patient motion artifact degrades the lung windows. No focal consolidations or pleural effusions. Mild scarring in the left lung base.  Chest wall/osseous: Visualized osseous structures have a normal appearance.  CT ABDOMEN AND PELVIS FINDINGS  Upper abdomen: No focal abnormality identified within the liver, adrenal glands, or kidneys. The spleen is absent. Small splenules are identified in the left upper quadrant. Lung the dorsum of the pancreas there is a 1.4 cm low-attenuation lesion like a representing a cyst. Within the pancreatic tail there is a 1.1 cm cystic lesion. Status post cholecystectomy.  Gastrointestinal tract: The stomach has a normal appearance. No evidence for bowel obstruction. Patient has a Hartmann's pouch. Left lower quadrant colostomy. There is a parastomal hernia which contains small bowel. No evidence for associated obstruction. The appendix is surgically absent.  Pelvis: The uterus is surgically absent. Within the pelvis there is a multiloculated  low-attenuation structure which measures 5.3 x 9.7 cm. Collection has increased since previous exam, raising the question of ovarian or peritoneal process. The ovaries are not well seen. There is no free pelvic fluid.  Retroperitoneum: There is dense atherosclerotic calcification of the abdominal aorta. No aneurysm.  Abdominal wall: Small para midline fat containing hernias. Moderate left lower quadrant parastomal small bowel containing hernia.  Osseous structures: Unremarkable.  Review of the MIP images confirms the above findings.  IMPRESSION: 1. Technically adequate exam showing no pulmonary embolus. 2. 3.0 cm low-attenuation lesion posterior to the right thyroid lobe. Thyroid ultrasound is recommended. 3. Absent spleen.  Left upper quadrant splenules. 4. Pancreatic cystic lesions. 5. Hartmann's pouch and left colostomy. Parastomal hernia contains nondilated loops of small bowel. 6. Small anterior wall fat containing hernias. 7. Multiloculated low-attenuation lesion within the pelvis posterior to the bladder suspicious for developing ovarian or peritoneal mass. Less likely similar findings could be seen with abscess. Further evaluation with ultrasound is recommended. If ultrasound cannot be performed pelvic MRI with contrast  is recommended.   Electronically Signed   By: Nolon Nations M.D.   On: 10/03/2014 18:43    Scheduled Meds: . antiseptic oral rinse  7 mL Mouth Rinse BID  . atorvastatin  20 mg Oral q morning - 44I  . folic acid  1 mg Oral Daily  . furosemide  120 mg Oral BID  . hydrALAZINE  25 mg Oral BID  . insulin aspart  0-15 Units Subcutaneous TID WC  . insulin aspart  6 Units Subcutaneous TID WC  . insulin glargine  10 Units Subcutaneous QHS  . isosorbide dinitrate  20 mg Oral BID  . lactulose  10 g Oral BID  . losartan  50 mg Oral Daily  . metoCLOPramide  10 mg Oral TID  . mometasone-formoterol  2 puff Inhalation BID  . pantoprazole  80 mg Oral Q1200  . rifaximin  550 mg Oral BID    Continuous Infusions:  Antibiotics Given (last 72 hours)    Date/Time Action Medication Dose   10/03/14 2250 Given   rifaximin (XIFAXAN) tablet 550 mg 550 mg   10/04/14 0950 Given   rifaximin (XIFAXAN) tablet 550 mg 550 mg      Principal Problem:   Abdominal pain, generalized Active Problems:   Chest pain   Anemia due to chronic blood loss   Hyperammonemia   Chronic diastolic CHF (congestive heart failure), NYHA class 4   Chronic GI bleeding   Pelvic mass in female    Time spent: 25 min    [name] Deborha Moseley  [facility] Pager [phone] If 7PM-7AM, please contact night-coverage at [email or web address], password [facility] 10/04/2014, 12:31 PM  LOS: 1 day

## 2014-10-04 NOTE — Consult Note (Signed)
   [facility] CM Inpatient Consult   10/04/2014  [name] 02-Jul-1950 [id number] Referral received to assess for [facility] Care Management services for community support and care/disease management needs.  Came by to meet with the patient however patient was in an on unit procedure.  Will continue to follow for needs.  Spoke with inpatient RNCM regarding referral as well. For questions, please contact; Natividad Brood, RN BSN [facility] Liaison  [phone] business mobile phone

## 2014-10-04 NOTE — Evaluation (Signed)
Physical Therapy Evaluation Patient Details Name: [name] MRN: [id number] DOB: 11-May-1950 Today's Date: 10/04/2014   History of Present Illness  [name] is a 64 y.o. female with numerous medical problems including multiple abdominal surgeries, pelvic mass, ongoing GIB bloody output from ostomy since earlier this year, CAD. Patient presents to the ED with 1 week history of abdominal pain.  Clinical Impression  Patient presents with decreased independence and safety with mobility due to deficits listed in PT problem list.  She will benefit from skilled PT in the acute setting to allow return home with family assist and HHPT services resumed as was active with HHPT prior to this admission.    Follow Up Recommendations Home health PT;Supervision for mobility/OOB    Equipment Recommendations  Wheelchair (measurements PT);Wheelchair cushion (measurements PT) (reports may already have wheelchair on order from [place])    Recommendations for Other Services       Precautions / Restrictions Precautions Precautions: Fall Precaution Comments: reports 3 falls in 6 months      Mobility  Bed Mobility Overal bed mobility: Needs Assistance Bed Mobility: Rolling;Sidelying to Sit Rolling: Min assist Sidelying to sit: Min assist       General bed mobility comments: use of rail for rolling and HOB about 30 degrees, (reports sleeps on four pillows at home,) pulled up to sit with hand hold assist  Transfers Overall transfer level: Needs assistance Equipment used: Rolling walker (2 wheeled) Transfers: Sit to/from Stand Sit to Stand: Min guard         General transfer comment: assist for safety  Ambulation/Gait Ambulation/Gait assistance: Min assist Ambulation Distance (Feet): 10 Feet Assistive device: Rolling walker (2 wheeled) Gait Pattern/deviations: Step-through pattern;Decreased stride length;Trunk flexed     General Gait Details: ambualted around bed to sit in  chair, limited by weakness, length of O2 tubing, and c/o abdominal pain  Stairs            Wheelchair Mobility    Modified Rankin (Stroke Patients Only)       Balance Overall balance assessment: Needs assistance   Sitting balance-Leahy Scale: Good     Standing balance support: Bilateral upper extremity supported Standing balance-Leahy Scale: Poor Standing balance comment: UE support on walker for balance                             Pertinent Vitals/Pain Pain Assessment: 0-10 Pain Score: 7  Pain Location: abdomen, on right side Pain Descriptors / Indicators: Aching Pain Intervention(s): Monitored during session;Limited activity within patient's tolerance    Home Living Family/patient expects to be discharged to:: Private residence Living Arrangements: Children;Other relatives (grandchildren) Available Help at Discharge: Family;Available 24 hours/day Type of Home: House Home Access: Stairs to enter Entrance Stairs-Rails: None Entrance Stairs-Number of Steps: 3 Home Layout: One level Home Equipment: Walker - 4 wheels;Walker - 2 wheels;Bedside commode;Hand held shower head;Tub bench Additional Comments: reports has had wheelchair ordered, but not sure she will get it due to already getting a four wheeled walker    Prior Function Level of Independence: Independent with assistive device(s)         Comments: able to walk with walker in the home, but difficulty on stairs     Hand Dominance   Dominant Hand: Right    Extremity/Trunk Assessment   Upper Extremity Assessment: RUE deficits/detail;LUE deficits/detail RUE Deficits / Details: Shoulder AROM limited to about 95, AAROM about 120, strength grossly  3+/5 grip, 4-/5 elbow flexion, shoulder strength NT     LUE Deficits / Details: Shoulder AROM limited to about 95, AAROM about 120, strength grossly 3+/5 grip, 3+/5 elbow flexion, shoulder strength NT   Lower Extremity Assessment: RLE  deficits/detail;LLE deficits/detail RLE Deficits / Details: AROM grossly WFL, strength hip flexion 3+/5, knee extension 4/5, anlkle DF 4/5 LLE Deficits / Details: AROM grossly WFL, strength hip flexion 3/5, knee extension 4-/5, anlkle DF 4/5  Cervical / Trunk Assessment: Kyphotic  Communication   Communication: No difficulties  Cognition Arousal/Alertness: Awake/alert Behavior During Therapy: Anxious Overall Cognitive Status: Within Functional Limits for tasks assessed (reports mixing up her words today)                      General Comments General comments (skin integrity, edema, etc.): Patient reports pain in tailbone, pillows placed in chair for comfort    Exercises        Assessment/Plan    PT Assessment Patient needs continued PT services  PT Diagnosis Generalized weakness;Abnormality of gait   PT Problem List Decreased strength;Pain;Decreased activity tolerance;Decreased balance;Decreased mobility;Decreased knowledge of precautions;Decreased knowledge of use of DME  PT Treatment Interventions DME instruction;Balance training;Gait training;Stair training;Functional mobility training;Therapeutic activities;Therapeutic exercise;Patient/family education   PT Goals (Current goals can be found in the Care Plan section) Acute Rehab PT Goals Patient Stated Goal: To return to independent PT Goal Formulation: With patient Time For Goal Achievement: 10/18/14 Potential to Achieve Goals: Good    Frequency Min 3X/week   Barriers to discharge        Co-evaluation               End of Session Equipment Utilized During Treatment: Gait belt Activity Tolerance: Patient limited by fatigue Patient left: in chair;with call bell/phone within reach           Time: 0926-1010 PT Time Calculation (min) (ACUTE ONLY): 44 min   Charges:   PT Evaluation $Initial PT Evaluation Tier I: 1 Procedure PT Treatments $Gait Training: 8-22 mins $Therapeutic Activity: 8-22  mins   PT G Codes:        [name],[name] 2014/10/07, 10:38 AM  [name], [name] 2014-10-07

## 2014-10-05 ENCOUNTER — Inpatient Hospital Stay (HOSPITAL_COMMUNITY): Payer: Medicare Other

## 2014-10-05 ENCOUNTER — Encounter: Payer: Self-pay | Admitting: *Deleted

## 2014-10-05 LAB — TSH: TSH: <0.01 u[IU]/mL — ABNORMAL LOW (ref 0.350–4.500)

## 2014-10-05 LAB — CBC
HCT: 31.1 % — ABNORMAL LOW (ref 36.0–46.0)
Hemoglobin: 9.9 g/dL — ABNORMAL LOW (ref 12.0–15.0)
MCH: 27.9 pg (ref 26.0–34.0)
MCHC: 31.8 g/dL (ref 30.0–36.0)
MCV: 87.6 fL (ref 78.0–100.0)
PLATELETS: 378 10*3/uL (ref 150–400)
RBC: 3.55 MIL/uL — AB (ref 3.87–5.11)
RDW: 26.6 % — AB (ref 11.5–15.5)
WBC: 14.7 10*3/uL — ABNORMAL HIGH (ref 4.0–10.5)

## 2014-10-05 LAB — CA 125: CA 125: 26.5 U/mL (ref 0.0–38.1)

## 2014-10-05 LAB — BASIC METABOLIC PANEL
Anion gap: 10 (ref 5–15)
BUN: 47 mg/dL — AB (ref 6–20)
CO2: 24 mmol/L (ref 22–32)
CREATININE: 1.46 mg/dL — AB (ref 0.44–1.00)
Calcium: 11.2 mg/dL — ABNORMAL HIGH (ref 8.9–10.3)
Chloride: 100 mmol/L — ABNORMAL LOW (ref 101–111)
GFR calc Af Amer: 43 mL/min — ABNORMAL LOW (ref >60)
GFR, EST NON AFRICAN AMERICAN: 37 mL/min — AB (ref >60)
GLUCOSE: 205 mg/dL — AB (ref 65–99)
POTASSIUM: 5.2 mmol/L — AB (ref 3.5–5.1)
SODIUM: 134 mmol/L — AB (ref 135–145)

## 2014-10-05 LAB — GLUCOSE, CAPILLARY
GLUCOSE-CAPILLARY: 158 mg/dL — AB (ref 65–99)
GLUCOSE-CAPILLARY: 165 mg/dL — AB (ref 65–99)
Glucose-Capillary: 147 mg/dL — ABNORMAL HIGH (ref 65–99)
Glucose-Capillary: 153 mg/dL — ABNORMAL HIGH (ref 65–99)
Glucose-Capillary: 192 mg/dL — ABNORMAL HIGH (ref 65–99)

## 2014-10-05 LAB — T4, FREE: FREE T4: 1.37 ng/dL — AB (ref 0.61–1.12)

## 2014-10-05 MED ORDER — OXYCODONE HCL 20 MG PO TABS: 20.0000 mg | ORAL_TABLET | ORAL | Status: DC | PRN

## 2014-10-05 MED ORDER — OXYCODONE HCL 5 MG PO TABS
20.0000 mg | ORAL_TABLET | ORAL | Status: DC | PRN
  Administered 2014-10-05 – 2014-10-09 (×21): 20 mg via ORAL
  Filled 2014-10-05 (×22): qty 4

## 2014-10-05 MED ORDER — HYDROMORPHONE HCL 1 MG/ML IJ SOLN
1.0000 mg | INTRAMUSCULAR | Status: DC | PRN
  Administered 2014-10-05 – 2014-10-08 (×14): 1 mg via INTRAVENOUS
  Filled 2014-10-05 (×14): qty 1

## 2014-10-05 MED ORDER — SODIUM CHLORIDE 0.9 % IV SOLN
INTRAVENOUS | Status: DC
  Administered 2014-10-05 (×2): via INTRAVENOUS

## 2014-10-05 NOTE — Progress Notes (Signed)
Physical Therapy Treatment Patient Details Name: [name] MRN: [id number] DOB: Oct 20, 1950 Today's Date: 10/05/2014    History of Present Illness [name] is a 64 y.o. female with numerous medical problems including multiple abdominal surgeries, pelvic mass, ongoing GIB bloody output from ostomy since earlier this year, CAD. Patient presents to the ED with 1 week history of abdominal pain.    PT Comments    Pt ambulated out of room into hallway today and was very encouraged by this. Min guard A first 100' with RW after which she was very fatigued and required seated rest break, min A for return to room. PT will continue to follow.   Follow Up Recommendations  Home health PT;Supervision for mobility/OOB     Equipment Recommendations  Wheelchair (measurements PT);Wheelchair cushion (measurements PT)    Recommendations for Other Services       Precautions / Restrictions Precautions Precautions: Fall Precaution Comments: reports 3 falls in 6 months, in part due to dizziness, in part due to left knee weakness Restrictions Weight Bearing Restrictions: No    Mobility  Bed Mobility               General bed mobility comments: pt received in chair  Transfers Overall transfer level: Needs assistance Equipment used: Rolling walker (2 wheeled) Transfers: Sit to/from Stand Sit to Stand: Min guard         General transfer comment: practiced standing from recliner, straight chair, and BSC, pt needs increased time but not physical assist  Ambulation/Gait Ambulation/Gait assistance: Min assist;Min guard Ambulation Distance (Feet): 200 Feet (120', seated rest, 80') Assistive device: Rolling walker (2 wheeled) Gait Pattern/deviations: Step-through pattern;Trunk flexed;Shuffle Gait velocity: decreased Gait velocity interpretation: <1.8 ft/sec, indicative of risk for recurrent falls General Gait Details: first 100' pt ambulated well, was able to converse, good  posture. Became very fatigued after that and began to tremble. Seated rest break x3 mins. More fatigued second ambulation with increased truk flexion and shuffling. O2 sats 98% on 3L O2, HR 86 bpm   Stairs            Wheelchair Mobility    Modified Rankin (Stroke Patients Only)       Balance Overall balance assessment: Needs assistance Sitting-balance support: No upper extremity supported Sitting balance-Leahy Scale: Good     Standing balance support: Single extremity supported Standing balance-Leahy Scale: Poor Standing balance comment: pt can stand for short period without UE support but very insecure, requests "please hold onto me". When standing at sink to wash hands, props forearms on counter for stability and avoids reaching                    Cognition Arousal/Alertness: Awake/alert Behavior During Therapy: WFL for tasks assessed/performed Overall Cognitive Status: Within Functional Limits for tasks assessed                      Exercises Other Exercises Other Exercises: AROM at ankle, knee, and hip bilaterally in sitting before beginning ambualtion    General Comments General comments (skin integrity, edema, etc.): pt brushed teeth in chair after session      Pertinent Vitals/Pain Pain Assessment: 0-10 Pain Score: 8  Pain Location: abdomen Pain Intervention(s): Monitored during session;Limited activity within patient's tolerance    Home Living                      Prior Function  PT Goals (current goals can now be found in the care plan section) Acute Rehab PT Goals Patient Stated Goal: To return to independent PT Goal Formulation: With patient Time For Goal Achievement: 10/18/14 Potential to Achieve Goals: Good Progress towards PT goals: Progressing toward goals    Frequency  Min 3X/week    PT Plan Current plan remains appropriate    Co-evaluation             End of Session Equipment Utilized During  Treatment: Gait belt;Oxygen Activity Tolerance: Patient limited by fatigue Patient left: in chair;with call bell/phone within reach     Time: 0845-0926 PT Time Calculation (min) (ACUTE ONLY): 41 min  Charges:  $Gait Training: 23-37 mins $Therapeutic Activity: 8-22 mins                    G Codes:     Leighton [name], PT  Acute Rehab Services  [phone]  Leighton [name] 10/05/2014, 9:38 AM

## 2014-10-05 NOTE — Progress Notes (Signed)
PROGRESS NOTE  [name] JOA:[id number] DOB: [birth year] DOA: 10/03/2014 PCP: [name], MD  Assessment/Plan: Abdominal pain, right side was seen by Dr. Denman George in the past for pelvic mass- had CT scan, U/S- spoke with Dr. Denman George she does not think this is related or cancerous with normal CA 125 Resume home pain meds with PRN dilaudid -appears to be chronic component when compared to previous admissions asking for dilaudid by name  Chest pain - resolved given chronic and ongoing GIB patient is not a candidate for anticoagulation at this time including ASA, plavix, heparin, etc. Thus it is unlikely that acute PCI would be possible on this patient.  Chronic GIB - She is actually seeing heme-onc for this and getting epogyn injections Does not seem to be anything that GI can do about it (see prior GI notes).  AKI -gentle IVF -renal U/S Hold lasix for now  DM -  SSI AC/HS moderate dose With 6 units mealtime coverage Increase lantus  Hyperammonemia - continue lactulose scheduled  Thyroid nodule -  TSH low- check free t4 and t3 -U/S?   Code Status: full Family Communication:  Disposition Plan:    Consultants:    Procedures:      HPI/Subjective: C/o right sided pain  Objective: Filed Vitals:   10/05/14 1136  BP: 120/56  Pulse: 86  Temp: 97.6 F (36.4 C)  Resp: 16    Intake/Output Summary (Last 24 hours) at 10/05/14 1336 Last data filed at 10/05/14 1300  Gross per 24 hour  Intake 1043.75 ml  Output   3850 ml  Net -2806.25 ml   Filed Weights   10/03/14 1543 10/04/14 0436  Weight: 92.987 kg (205 lb) 90.81 kg (200 lb 3.2 oz)    Exam:   General:  Awake, NAD  Cardiovascular: rrr  Respiratory: clear  Abdomen: +BS, soft  Musculoskeletal: no edema   Data Reviewed: Basic Metabolic Panel:  Recent Labs Lab 10/03/14 1608 10/05/14 0116  NA 135 134*  K 5.3* 5.2*  CL 105 100*  CO2 22 24  GLUCOSE 76 205*  BUN 49* 47*  CREATININE 1.47*  1.46*  CALCIUM 11.5* 11.2*   Liver Function Tests:  Recent Labs Lab 10/03/14 1608  AST 39  ALT 39  ALKPHOS 60  BILITOT 0.3  PROT 6.0*  ALBUMIN 3.1*    Recent Labs Lab 10/03/14 1608  LIPASE 60*    Recent Labs Lab 10/03/14 1602  AMMONIA 79*   CBC:  Recent Labs Lab 10/03/14 1608 10/05/14 0116  WBC 17.7* 14.7*  HGB 9.5* 9.9*  HCT 30.3* 31.1*  MCV 88.3 87.6  PLT 419* 378   Cardiac Enzymes:  Recent Labs Lab 10/04/14 0136 10/04/14 0741 10/04/14 1621  TROPONINI 0.06* 0.05* 0.04*   BNP (last 3 results)  Recent Labs  04/12/14 0710 05/15/14 2322 05/21/14 0427  BNP 311.9* 918.6* 120.7*    ProBNP (last 3 results)  Recent Labs  03/30/14 1230  PROBNP 71.0    CBG:  Recent Labs Lab 10/04/14 1606 10/04/14 2048 10/05/14 0417 10/05/14 0735 10/05/14 1138  GLUCAP 226* 196* 147* 153* 192*    Recent Results (from the past 240 hour(s))  MRSA PCR Screening     Status: None   Collection Time: 10/03/14 10:48 PM  Result Value Ref Range Status   MRSA by PCR NEGATIVE NEGATIVE Final    Comment:        The GeneXpert MRSA Assay (FDA approved for NASAL specimens only), is one component of a comprehensive  MRSA colonization surveillance program. It is not intended to diagnose MRSA infection nor to guide or monitor treatment for MRSA infections.      Studies: Dg Chest 2 View  10/03/2014   CLINICAL DATA:  Increased short of breath  EXAM: CHEST  2 VIEW  COMPARISON:  Radiograph 05/19/2014  FINDINGS: LEFT-sided pacemaker overlies normal cardiac silhouette. No effusion, infiltrate, or pneumothorax. No acute osseous abnormality.  IMPRESSION: No acute cardiopulmonary process.   Electronically Signed   By: Suzy Bouchard M.D.   On: 10/03/2014 16:55   Ct Angio Chest Pe W/cm &/or Wo Cm  10/03/2014   CLINICAL DATA:  Right lower quadrant pain. IBS on going and has worsened. Multiple abdominal surgeries. Right lower quadrant pain, fever. Pleuritic chest pain and  shortness of breath. History of lung cancer.  EXAM: CT ANGIOGRAPHY CHEST  CT ABDOMEN AND PELVIS WITH CONTRAST  TECHNIQUE: Multidetector CT imaging of the chest was performed using the standard protocol during bolus administration of intravenous contrast. Multiplanar CT image reconstructions and MIPs were obtained to evaluate the vascular anatomy. Multidetector CT imaging of the abdomen and pelvis was performed using the standard protocol during bolus administration of intravenous contrast.  CONTRAST:  80 cc OMNIPAQUE IOHEXOL 350 MG/ML SOLN  COMPARISON:  None.  FINDINGS: CT CHEST FINDINGS  Heart: Left-sided pacemaker leads to the right atrium and right ventricle. Heart size is normal. No significant coronary artery calcifications. No pericardial effusion.  Vascular structures: Pulmonary arteries are well opacified. There is no acute pulmonary embolus. There is atherosclerotic calcification of the thoracic aorta. No aneurysm.  Mediastinum/thyroid: Posterior to the right lobe of the thyroid there is a low-attenuation mass which measures 2.8 x 3.0 cm. Findings favor a thyroid cyst. Further evaluation with ultrasound is recommended however.  Lungs/Airways: Patient motion artifact degrades the lung windows. No focal consolidations or pleural effusions. Mild scarring in the left lung base.  Chest wall/osseous: Visualized osseous structures have a normal appearance.  CT ABDOMEN AND PELVIS FINDINGS  Upper abdomen: No focal abnormality identified within the liver, adrenal glands, or kidneys. The spleen is absent. Small splenules are identified in the left upper quadrant. Lung the dorsum of the pancreas there is a 1.4 cm low-attenuation lesion like a representing a cyst. Within the pancreatic tail there is a 1.1 cm cystic lesion. Status post cholecystectomy.  Gastrointestinal tract: The stomach has a normal appearance. No evidence for bowel obstruction. Patient has a Hartmann's pouch. Left lower quadrant colostomy. There is a  parastomal hernia which contains small bowel. No evidence for associated obstruction. The appendix is surgically absent.  Pelvis: The uterus is surgically absent. Within the pelvis there is a multiloculated low-attenuation structure which measures 5.3 x 9.7 cm. Collection has increased since previous exam, raising the question of ovarian or peritoneal process. The ovaries are not well seen. There is no free pelvic fluid.  Retroperitoneum: There is dense atherosclerotic calcification of the abdominal aorta. No aneurysm.  Abdominal wall: Small para midline fat containing hernias. Moderate left lower quadrant parastomal small bowel containing hernia.  Osseous structures: Unremarkable.  Review of the MIP images confirms the above findings.  IMPRESSION: 1. Technically adequate exam showing no pulmonary embolus. 2. 3.0 cm low-attenuation lesion posterior to the right thyroid lobe. Thyroid ultrasound is recommended. 3. Absent spleen.  Left upper quadrant splenules. 4. Pancreatic cystic lesions. 5. Hartmann's pouch and left colostomy. Parastomal hernia contains nondilated loops of small bowel. 6. Small anterior wall fat containing hernias. 7. Multiloculated low-attenuation lesion within  the pelvis posterior to the bladder suspicious for developing ovarian or peritoneal mass. Less likely similar findings could be seen with abscess. Further evaluation with ultrasound is recommended. If ultrasound cannot be performed pelvic MRI with contrast is recommended.   Electronically Signed   By: Nolon Nations M.D.   On: 10/03/2014 18:43   US Transvaginal Non-ob  10/04/2014   CLINICAL DATA:  Pelvic mass seen on prior CT scan.  EXAM: TRANSABDOMINAL AND TRANSVAGINAL ULTRASOUND OF PELVIS  TECHNIQUE: Both transabdominal and transvaginal ultrasound examinations of the pelvis were performed. Transabdominal technique was performed for global imaging of the pelvis including uterus, ovaries, adnexal regions, and pelvic cul-de-sac. It was  necessary to proceed with endovaginal exam following the transabdominal exam to visualize the adnexal regions.  COMPARISON:  CT scan of October 03, 2014; ultrasound of April 15, 2014.  FINDINGS: Status post hysterectomy.  Right ovary  Not visualized.  Left ovary  Not visualized.  Other findings  Complex multi-septated mass is seen in the right adnexal region which corresponds to abnormality seen on CT scan. It measures 6.1 x 5.4 x 9.8 cm. Several of the septa appear to be thickened. This does not appear to be significantly changed compared to prior exam.  IMPRESSION: Status post hysterectomy. Ovaries are not visualized. 9.8 x 6.1 x 5.4 cm complex multi-septated mass is noted in the right adnexal region which corresponds to abnormality seen on CT scan. 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 Gynecological consultation is recommended.   Electronically Signed   By: Marijo Conception, M.D.   On: 10/04/2014 16:03   US Pelvis Complete  10/04/2014   CLINICAL DATA:  Pelvic mass seen on prior CT scan.  EXAM: TRANSABDOMINAL AND TRANSVAGINAL ULTRASOUND OF PELVIS  TECHNIQUE: Both transabdominal and transvaginal ultrasound examinations of the pelvis were performed. Transabdominal technique was performed for global imaging of the pelvis including uterus, ovaries, adnexal regions, and pelvic cul-de-sac. It was necessary to proceed with endovaginal exam following the transabdominal exam to visualize the adnexal regions.  COMPARISON:  CT scan of October 03, 2014; ultrasound of April 15, 2014.  FINDINGS: Status post hysterectomy.  Right ovary  Not visualized.  Left ovary  Not visualized.  Other findings  Complex multi-septated mass is seen in the right adnexal region which corresponds to abnormality seen on CT scan. It measures 6.1 x 5.4 x 9.8 cm. Several of the septa appear to be thickened. This does not appear to be significantly changed compared  to prior exam.  IMPRESSION: Status post hysterectomy. Ovaries are not visualized. 9.8 x 6.1 x 5.4 cm complex multi-septated mass is noted in the right adnexal region which corresponds to abnormality seen on CT scan. 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 Gynecological consultation is recommended.   Electronically Signed   By: Marijo Conception, M.D.   On: 10/04/2014 16:03   Ct Abdomen Pelvis W Contrast  10/03/2014   CLINICAL DATA:  Right lower quadrant pain. IBS on going and has worsened. Multiple abdominal surgeries. Right lower quadrant pain, fever. Pleuritic chest pain and shortness of breath. History of lung cancer.  EXAM: CT ANGIOGRAPHY CHEST  CT ABDOMEN AND PELVIS WITH CONTRAST  TECHNIQUE: Multidetector CT imaging of the chest was performed using the standard protocol during bolus administration of intravenous contrast. Multiplanar CT image reconstructions and MIPs were obtained to evaluate the vascular  anatomy. Multidetector CT imaging of the abdomen and pelvis was performed using the standard protocol during bolus administration of intravenous contrast.  CONTRAST:  80 cc OMNIPAQUE IOHEXOL 350 MG/ML SOLN  COMPARISON:  None.  FINDINGS: CT CHEST FINDINGS  Heart: Left-sided pacemaker leads to the right atrium and right ventricle. Heart size is normal. No significant coronary artery calcifications. No pericardial effusion.  Vascular structures: Pulmonary arteries are well opacified. There is no acute pulmonary embolus. There is atherosclerotic calcification of the thoracic aorta. No aneurysm.  Mediastinum/thyroid: Posterior to the right lobe of the thyroid there is a low-attenuation mass which measures 2.8 x 3.0 cm. Findings favor a thyroid cyst. Further evaluation with ultrasound is recommended however.  Lungs/Airways: Patient motion artifact degrades the lung windows. No focal consolidations or pleural effusions. Mild scarring  in the left lung base.  Chest wall/osseous: Visualized osseous structures have a normal appearance.  CT ABDOMEN AND PELVIS FINDINGS  Upper abdomen: No focal abnormality identified within the liver, adrenal glands, or kidneys. The spleen is absent. Small splenules are identified in the left upper quadrant. Lung the dorsum of the pancreas there is a 1.4 cm low-attenuation lesion like a representing a cyst. Within the pancreatic tail there is a 1.1 cm cystic lesion. Status post cholecystectomy.  Gastrointestinal tract: The stomach has a normal appearance. No evidence for bowel obstruction. Patient has a Hartmann's pouch. Left lower quadrant colostomy. There is a parastomal hernia which contains small bowel. No evidence for associated obstruction. The appendix is surgically absent.  Pelvis: The uterus is surgically absent. Within the pelvis there is a multiloculated low-attenuation structure which measures 5.3 x 9.7 cm. Collection has increased since previous exam, raising the question of ovarian or peritoneal process. The ovaries are not well seen. There is no free pelvic fluid.  Retroperitoneum: There is dense atherosclerotic calcification of the abdominal aorta. No aneurysm.  Abdominal wall: Small para midline fat containing hernias. Moderate left lower quadrant parastomal small bowel containing hernia.  Osseous structures: Unremarkable.  Review of the MIP images confirms the above findings.  IMPRESSION: 1. Technically adequate exam showing no pulmonary embolus. 2. 3.0 cm low-attenuation lesion posterior to the right thyroid lobe. Thyroid ultrasound is recommended. 3. Absent spleen.  Left upper quadrant splenules. 4. Pancreatic cystic lesions. 5. Hartmann's pouch and left colostomy. Parastomal hernia contains nondilated loops of small bowel. 6. Small anterior wall fat containing hernias. 7. Multiloculated low-attenuation lesion within the pelvis posterior to the bladder suspicious for developing ovarian or peritoneal  mass. Less likely similar findings could be seen with abscess. Further evaluation with ultrasound is recommended. If ultrasound cannot be performed pelvic MRI with contrast is recommended.   Electronically Signed   By: Nolon Nations M.D.   On: 10/03/2014 18:43    Scheduled Meds: . antiseptic oral rinse  7 mL Mouth Rinse BID  . atorvastatin  20 mg Oral q morning - 93A  . folic acid  1 mg Oral Daily  . furosemide  120 mg Oral BID  . hydrALAZINE  25 mg Oral BID  . insulin aspart  0-15 Units Subcutaneous TID WC  . insulin aspart  6 Units Subcutaneous TID WC  . insulin glargine  20 Units Subcutaneous QHS  . isosorbide dinitrate  20 mg Oral BID  . lactulose  10 g Oral BID  . losartan  50 mg Oral Daily  . metoCLOPramide  10 mg Oral TID  . mometasone-formoterol  2 puff Inhalation BID  . pantoprazole  80 mg  Oral Q1200  . rifaximin  550 mg Oral BID   Continuous Infusions: . sodium chloride 75 mL/hr at 10/05/14 1300   Antibiotics Given (last 72 hours)    Date/Time Action Medication Dose   10/03/14 2250 Given   rifaximin (XIFAXAN) tablet 550 mg 550 mg   10/04/14 0950 Given   rifaximin (XIFAXAN) tablet 550 mg 550 mg   10/04/14 2252 Given   rifaximin (XIFAXAN) tablet 550 mg 550 mg   10/05/14 1016 Given   rifaximin (XIFAXAN) tablet 550 mg 550 mg      Principal Problem:   Abdominal pain, generalized Active Problems:   Chest pain   Anemia due to chronic blood loss   Hyperammonemia   Chronic diastolic CHF (congestive heart failure), NYHA class 4   Chronic GI bleeding   Pelvic mass in female    Time spent: 25 min    [name] Vikki Gains  [facility] Pager [phone] If 7PM-7AM, please contact night-coverage at [email or web address], password [facility] 10/05/2014, 1:36 PM  LOS: 2 days

## 2014-10-05 NOTE — Progress Notes (Signed)
Lab notified. TSH less than 0.010. Dr. [name] notified

## 2014-10-05 NOTE — Consult Note (Signed)
   [facility] CM Inpatient Consult   10/05/2014  [name] 1950-02-07 [id number]   Follow up [place] Management bedside visit made. Spoke with patient and explained [place] Management. Consents obtained. Explained that [facility] services will not interfere with services provided by home health. She reports she is active with [facility]. Explained to patient that she will receive post hospital transition of care calls and will be evaluated for monthly home visits. She has history of CHF and DM. States she lives with family. She put her son as emergency contact on consent but endorses any of her 4 kids can be called if needed. Made inpatient RNCM aware. Confirmed best contact number as [phone]. [facility] Care Management packet left at bedside.  [name], MSN-Ed, RN,BSN [facility] Liaison [phone]

## 2014-10-06 LAB — CBC
HCT: 28.7 % — ABNORMAL LOW (ref 36.0–46.0)
HEMOGLOBIN: 9.4 g/dL — AB (ref 12.0–15.0)
MCH: 28.3 pg (ref 26.0–34.0)
MCHC: 32.8 g/dL (ref 30.0–36.0)
MCV: 86.4 fL (ref 78.0–100.0)
PLATELETS: 369 10*3/uL (ref 150–400)
RBC: 3.32 MIL/uL — AB (ref 3.87–5.11)
RDW: 25.9 % — ABNORMAL HIGH (ref 11.5–15.5)
WBC: 12.1 10*3/uL — ABNORMAL HIGH (ref 4.0–10.5)

## 2014-10-06 LAB — GLUCOSE, CAPILLARY
GLUCOSE-CAPILLARY: 136 mg/dL — AB (ref 65–99)
GLUCOSE-CAPILLARY: 157 mg/dL — AB (ref 65–99)
Glucose-Capillary: 142 mg/dL — ABNORMAL HIGH (ref 65–99)
Glucose-Capillary: 191 mg/dL — ABNORMAL HIGH (ref 65–99)
Glucose-Capillary: 152 mg/dL — ABNORMAL HIGH (ref 65–99)

## 2014-10-06 LAB — BASIC METABOLIC PANEL
Anion gap: 5 (ref 5–15)
BUN: 35 mg/dL — AB (ref 6–20)
CALCIUM: 10.8 mg/dL — AB (ref 8.9–10.3)
CHLORIDE: 99 mmol/L — AB (ref 101–111)
CO2: 26 mmol/L (ref 22–32)
CREATININE: 1.19 mg/dL — AB (ref 0.44–1.00)
GFR, EST AFRICAN AMERICAN: 56 mL/min — AB (ref >60)
GFR, EST NON AFRICAN AMERICAN: 48 mL/min — AB (ref >60)
Glucose, Bld: 153 mg/dL — ABNORMAL HIGH (ref 65–99)
Potassium: 4.9 mmol/L (ref 3.5–5.1)
SODIUM: 130 mmol/L — AB (ref 135–145)

## 2014-10-06 LAB — TSH: TSH: 0.012 u[IU]/mL — AB (ref 0.350–4.500)

## 2014-10-06 LAB — T4, FREE: FREE T4: 1.21 ng/dL — AB (ref 0.61–1.12)

## 2014-10-06 LAB — T3: T3, Total: 119 ng/dL (ref 71–180)

## 2014-10-06 MED ORDER — FUROSEMIDE 80 MG PO TABS
80.0000 mg | ORAL_TABLET | Freq: Two times a day (BID) | ORAL | Status: DC
  Administered 2014-10-06 – 2014-10-07 (×3): 80 mg via ORAL
  Filled 2014-10-06 (×3): qty 1

## 2014-10-06 NOTE — Progress Notes (Signed)
Patient stated she felt "shaky" and thought her blood sugar may be low, checked and CBG 136

## 2014-10-06 NOTE — Patient Outreach (Signed)
[facility]) Care Management  10/06/2014  [name] [birth year] [id number]   Referral from [name], RN for [employer], assigned Tomasa Rand, Therapist, sports.  Thanks, Ronnell Freshwater. [place], [place] Assistant Phone: [phone] Fax: [phone]

## 2014-10-06 NOTE — Progress Notes (Addendum)
PROGRESS NOTE  [name] NAT:[id number] DOB: [birth year] DOA: 10/03/2014 PCP: [name], MD  Assessment/Plan: Abdominal pain, right side was seen by Dr. Denman George in the past for pelvic mass- had CT scan, U/S- spoke with Dr. Denman George she does not think this is related or cancerous with normal CA 125 Resume home pain meds with PRN dilaudid-wean as tolerated -appears to be chronic component when compared to previous admissions asking for dilaudid by name -does not appear to be in severe pain when observed  Chest pain - resolved given chronic and ongoing GIB patient is not a candidate for anticoagulation at this time including ASA, plavix, heparin, etc. Thus it is unlikely that acute PCI would be possible on this patient.  Chronic GIB - She is actually seeing heme-onc for this and getting epogyn injections Does not seem to be anything that GI can do about it (see prior GI notes).  AKI -resume lasix at a lesser dose  DM -  SSI AC/HS moderate dose With 6 units mealtime coverage Increase lantus  Hyperammonemia - continue lactulose scheduled  Thyroid nodule with apparent hyperthyroidism  TSH low- free t4 high -will call endocrine for further management: can follow with Dr. Renne Crigler-- recheck TSH, Free t4, free t3, TSIs   PT evaluation- was active with home health before admission   Code Status: full Family Communication: patient Disposition Plan:    Consultants:    Procedures:      HPI/Subjective: Still c/o severe pain  Objective: Filed Vitals:   10/06/14 0839  BP: 130/37  Pulse: 64  Temp: 98.6 F (37 C)  Resp:     Intake/Output Summary (Last 24 hours) at 10/06/14 1001 Last data filed at 10/06/14 0935  Gross per 24 hour  Intake 1243.75 ml  Output   2700 ml  Net -1456.25 ml   Filed Weights   10/03/14 1543 10/04/14 0436 10/06/14 0425  Weight: 92.987 kg (205 lb) 90.81 kg (200 lb 3.2 oz) 91.627 kg (202 lb)    Exam:   General:  Awake,  NAD  Cardiovascular: rrr  Respiratory: clear  Abdomen: +BS, soft, tender to palpation  Musculoskeletal: no edema   Data Reviewed: Basic Metabolic Panel:  Recent Labs Lab 10/03/14 1608 10/05/14 0116 10/06/14 0600  NA 135 134* 130*  K 5.3* 5.2* 4.9  CL 105 100* 99*  CO2 '22 24 26  '$ GLUCOSE 76 205* 153*  BUN 49* 47* 35*  CREATININE 1.47* 1.46* 1.19*  CALCIUM 11.5* 11.2* 10.8*   Liver Function Tests:  Recent Labs Lab 10/03/14 1608  AST 39  ALT 39  ALKPHOS 60  BILITOT 0.3  PROT 6.0*  ALBUMIN 3.1*    Recent Labs Lab 10/03/14 1608  LIPASE 60*    Recent Labs Lab 10/03/14 1602  AMMONIA 79*   CBC:  Recent Labs Lab 10/03/14 1608 10/05/14 0116 10/06/14 0600  WBC 17.7* 14.7* 12.1*  HGB 9.5* 9.9* 9.4*  HCT 30.3* 31.1* 28.7*  MCV 88.3 87.6 86.4  PLT 419* 378 369   Cardiac Enzymes:  Recent Labs Lab 10/04/14 0136 10/04/14 0741 10/04/14 1621  TROPONINI 0.06* 0.05* 0.04*   BNP (last 3 results)  Recent Labs  04/12/14 0710 05/15/14 2322 05/21/14 0427  BNP 311.9* 918.6* 120.7*    ProBNP (last 3 results)  Recent Labs  03/30/14 1230  PROBNP 71.0    CBG:  Recent Labs Lab 10/05/14 1138 10/05/14 1638 10/05/14 2120 10/06/14 0404 10/06/14 0730  GLUCAP 192* 158* 165* 136* 142*    Recent  Results (from the past 240 hour(s))  MRSA PCR Screening     Status: None   Collection Time: 10/03/14 10:48 PM  Result Value Ref Range Status   MRSA by PCR NEGATIVE NEGATIVE Final    Comment:        The GeneXpert MRSA Assay (FDA approved for NASAL specimens only), is one component of a comprehensive MRSA colonization surveillance program. It is not intended to diagnose MRSA infection nor to guide or monitor treatment for MRSA infections.      Studies: US Transvaginal Non-ob  09-Oct-2014   CLINICAL DATA:  Pelvic mass seen on prior CT scan.  EXAM: TRANSABDOMINAL AND TRANSVAGINAL ULTRASOUND OF PELVIS  TECHNIQUE: Both transabdominal and transvaginal  ultrasound examinations of the pelvis were performed. Transabdominal technique was performed for global imaging of the pelvis including uterus, ovaries, adnexal regions, and pelvic cul-de-sac. It was necessary to proceed with endovaginal exam following the transabdominal exam to visualize the adnexal regions.  COMPARISON:  CT scan of October 03, 2014; ultrasound of April 15, 2014.  FINDINGS: Status post hysterectomy.  Right ovary  Not visualized.  Left ovary  Not visualized.  Other findings  Complex multi-septated mass is seen in the right adnexal region which corresponds to abnormality seen on CT scan. It measures 6.1 x 5.4 x 9.8 cm. Several of the septa appear to be thickened. This does not appear to be significantly changed compared to prior exam.  IMPRESSION: Status post hysterectomy. Ovaries are not visualized. 9.8 x 6.1 x 5.4 cm complex multi-septated mass is noted in the right adnexal region which corresponds to abnormality seen on CT scan. 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 Gynecological consultation is recommended.   Electronically Signed   By: Marijo Conception, M.D.   On: 09-Oct-2014 16:03   US Pelvis Complete  10/09/2014   CLINICAL DATA:  Pelvic mass seen on prior CT scan.  EXAM: TRANSABDOMINAL AND TRANSVAGINAL ULTRASOUND OF PELVIS  TECHNIQUE: Both transabdominal and transvaginal ultrasound examinations of the pelvis were performed. Transabdominal technique was performed for global imaging of the pelvis including uterus, ovaries, adnexal regions, and pelvic cul-de-sac. It was necessary to proceed with endovaginal exam following the transabdominal exam to visualize the adnexal regions.  COMPARISON:  CT scan of October 03, 2014; ultrasound of April 15, 2014.  FINDINGS: Status post hysterectomy.  Right ovary  Not visualized.  Left ovary  Not visualized.  Other findings  Complex multi-septated mass is seen in the right  adnexal region which corresponds to abnormality seen on CT scan. It measures 6.1 x 5.4 x 9.8 cm. Several of the septa appear to be thickened. This does not appear to be significantly changed compared to prior exam.  IMPRESSION: Status post hysterectomy. Ovaries are not visualized. 9.8 x 6.1 x 5.4 cm complex multi-septated mass is noted in the right adnexal region which corresponds to abnormality seen on CT scan. 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 Gynecological consultation is recommended.   Electronically Signed   By: Marijo Conception, M.D.   On: October 09, 2014 16:03   US Renal  10/05/2014   CLINICAL DATA:  Acute kidney injury.  EXAM: RENAL / URINARY TRACT ULTRASOUND COMPLETE  COMPARISON:  None.  FINDINGS: Right Kidney:  Length: 10.8 cm. Echogenicity within normal limits. No mass or hydronephrosis visualized.  Left Kidney:  Length: 10.4 cm. Echogenicity within normal  limits. No mass or hydronephrosis visualized.  Bladder:  Appears normal for degree of bladder distention.  IMPRESSION: Negative for hydronephrosis.  Negative exam.   Electronically Signed   By: Inge Rise M.D.   On: 10/05/2014 19:46    Scheduled Meds: . antiseptic oral rinse  7 mL Mouth Rinse BID  . atorvastatin  20 mg Oral q morning - 40H  . folic acid  1 mg Oral Daily  . furosemide  80 mg Oral BID  . hydrALAZINE  25 mg Oral BID  . insulin aspart  0-15 Units Subcutaneous TID WC  . insulin aspart  6 Units Subcutaneous TID WC  . insulin glargine  20 Units Subcutaneous QHS  . isosorbide dinitrate  20 mg Oral BID  . lactulose  10 g Oral BID  . losartan  50 mg Oral Daily  . metoCLOPramide  10 mg Oral TID  . mometasone-formoterol  2 puff Inhalation BID  . pantoprazole  80 mg Oral Q1200  . rifaximin  550 mg Oral BID   Continuous Infusions:   Antibiotics Given (last 72 hours)    Date/Time Action Medication Dose   10/03/14 2250 Given   rifaximin  (XIFAXAN) tablet 550 mg 550 mg   10/04/14 0950 Given   rifaximin (XIFAXAN) tablet 550 mg 550 mg   10/04/14 2252 Given   rifaximin (XIFAXAN) tablet 550 mg 550 mg   10/05/14 1016 Given   rifaximin (XIFAXAN) tablet 550 mg 550 mg   10/05/14 2303 Given   rifaximin (XIFAXAN) tablet 550 mg 550 mg      Principal Problem:   Abdominal pain, generalized Active Problems:   Chest pain   Anemia due to chronic blood loss   Hyperammonemia   Chronic diastolic CHF (congestive heart failure), NYHA class 4   Chronic GI bleeding   Pelvic mass in female    Time spent: 25 min    [name] [name]  [facility] Pager [phone] If 7PM-7AM, please contact night-coverage at [email or web address], password [facility] 10/06/2014, 10:01 AM  LOS: 3 days

## 2014-10-06 NOTE — Care Management Important Message (Signed)
Important Message  Patient Details  Name: DALARY HOLLAR MRN: [id number] Date of Birth: 1950-09-01   Medicare Important Message Given:  Yes-second notification given    Nathen May 10/06/2014, 10:00 AMImportant Message  Patient Details  Name: MARA FAVERO MRN: [id number] Date of Birth: 04-28-1950   Medicare Important Message Given:  Yes-second notification given    Nathen May 10/06/2014, 9:59 AM

## 2014-10-06 NOTE — Care Management Note (Addendum)
Case Management Note  Patient Details  Name: [name] MRN: [id number] Date of Birth: Jan 24, 1951  Subjective/Objective:      Pt admitted for Abdominal pain. Pt is from home.               Action/Plan: Plan to return home once stable. Pt is active with [facility] for [facility] PT. Pt will need resumption orders once stable for d/c. PT recommendations for DME WC. MD will need to place order for DME if this continues to be the plan. [facility] to f/u once d/c as well. CM will continue to monitor.    Expected Discharge Date:                  Expected Discharge Plan:  [place]  In-House Referral:     Discharge planning Services  CM Consult  Post Acute Care Choice:  [place], Resumption of Svcs/PTA Provider Choice offered to:  Patient  DME Arranged:    DME Agency:     HH Arranged:  PT HH Agency:  [place]  Status of Service:  In process, will continue to follow  Medicare Important Message Given:  Yes-second notification given Date Medicare IM Given:    Medicare IM give by:    Date Additional Medicare IM Given:    Additional Medicare Important Message give by:     If discussed at [place] of Stay Meetings, dates discussed:    Additional Comments: 1223 10-07-14 Jacqlyn Krauss, RN,BSN [phone] Pt is active with [facility] for [place]. Pt will need resumption orders once completed. Fax # to [employer] is [phone].     Bethena Roys, RN 10/06/2014, 12:29 PM

## 2014-10-07 DIAGNOSIS — R1084 Generalized abdominal pain: Secondary | ICD-10-CM

## 2014-10-07 LAB — CBC
HCT: 29.4 % — ABNORMAL LOW (ref 36.0–46.0)
HEMOGLOBIN: 9.5 g/dL — AB (ref 12.0–15.0)
MCH: 28.4 pg (ref 26.0–34.0)
MCHC: 32.3 g/dL (ref 30.0–36.0)
MCV: 88 fL (ref 78.0–100.0)
Platelets: 337 10*3/uL (ref 150–400)
RBC: 3.34 MIL/uL — ABNORMAL LOW (ref 3.87–5.11)
RDW: 25.3 % — ABNORMAL HIGH (ref 11.5–15.5)
WBC: 19.7 10*3/uL — ABNORMAL HIGH (ref 4.0–10.5)

## 2014-10-07 LAB — GLUCOSE, CAPILLARY
GLUCOSE-CAPILLARY: 139 mg/dL — AB (ref 65–99)
GLUCOSE-CAPILLARY: 151 mg/dL — AB (ref 65–99)
Glucose-Capillary: 156 mg/dL — ABNORMAL HIGH (ref 65–99)

## 2014-10-07 LAB — BASIC METABOLIC PANEL
Anion gap: 8 (ref 5–15)
BUN: 33 mg/dL — AB (ref 6–20)
CHLORIDE: 93 mmol/L — AB (ref 101–111)
CO2: 25 mmol/L (ref 22–32)
CREATININE: 1.17 mg/dL — AB (ref 0.44–1.00)
Calcium: 10.7 mg/dL — ABNORMAL HIGH (ref 8.9–10.3)
GFR calc Af Amer: 57 mL/min — ABNORMAL LOW (ref >60)
GFR calc non Af Amer: 49 mL/min — ABNORMAL LOW (ref >60)
GLUCOSE: 155 mg/dL — AB (ref 65–99)
Potassium: 5.2 mmol/L — ABNORMAL HIGH (ref 3.5–5.1)
SODIUM: 126 mmol/L — AB (ref 135–145)

## 2014-10-07 LAB — T3, FREE: T3 FREE: 3.1 pg/mL (ref 2.0–4.4)

## 2014-10-07 MED ORDER — FUROSEMIDE 80 MG PO TABS
100.0000 mg | ORAL_TABLET | Freq: Two times a day (BID) | ORAL | Status: DC
  Administered 2014-10-07 – 2014-10-09 (×4): 100 mg via ORAL
  Filled 2014-10-07 (×8): qty 1

## 2014-10-07 NOTE — Consult Note (Addendum)
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  She is receptive to that. Output pasty, brown Ostomy pouching: 1pc convex with 2" barrier ring Education provided: patient independent with her ostomy care but does require minimal assistance with application of new pouch and barrier ring. [place] team will remain available as needed [place], [place]

## 2014-10-07 NOTE — Consult Note (Signed)
Consultation  Referring Provider: [facility] hospital[facility] Primary Care Physician:  [name], MD Primary Gastroenterologist:  Dr.Kaplan  Reason for Consultation:  Abdominal pain  HPI: [name] is a 64 y.o. female with multiple medical problems admitted to the hospitalist service on 10/03/2014 after presenting to the emergency room with complaints of 1 week of worsening right lower quadrant pain. She had seen her PCP [name] and was placed on a course of Cipro and Flagyl but had no improvement in her symptoms and was told to come to the emergency room. Patient has history of chronic respiratory failure secondary to COPD and is O2 dependent, has had a prior splenectomy, hysterectomy and left colectomy after a remote perforation. She has a chronic left lower quadrant colostomy. 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 History is also pertinent for cervical cancer and and lung cancer in 2004. She is continued to be followed by Dr. Earlie Server because of her chronic anemia. CT scan of the abdomen and pelvis on 10/03/2014 showed her to be's status post splenectomy. She has a stable 1.1 cm pancreatic tail cystic lesion and a peristomal hernia containing small bowel but no evidence of obstruction. She also has a multiloculated 5.3 x 9.7 cm pelvic structure increased in size since last exam and concerning for ovarian or peritoneal neoplasm. Pelvic ultrasound shows similar findings. Patient's continues to have chronic pain in the right lower quadrant which she says is constant and sharp in nature and sometimes radiates higher up into the right side. She says she has been having pain on the right side mildly for most of this year. She takes oxycodone at home for other chronic abdominal pain and says this had been working well for the right lower quadrant pain until about a week and a half ago when it increased and was noncontrolled. Review of her  records show that she has had CA 125 which is normal in repeated again this admission normal. She had been referred to GYN oncology in February 2016 for evaluation of the right adnexal cystic lesion. In December 2015 this lesion measured 2 cm by CT scan in February 2016 measured 3.3 x 2.1 cm by CT scan. GYN oncology did not feel that she was a surgical candidate because of her comorbidities, and no further workup was done. Last EGD October 2015 normal esophagus and stomach Last colonoscopy February 2013 mild chronic inflammation of the ostomy, otherwise negative.    Past Medical History  Diagnosis Date  . Cirrhosis   . GERD (gastroesophageal reflux disease)   . Cervical disc syndrome     trouble turnng neck at times  . Chronic respiratory failure   . Diverticulitis of colon   . Perforation of colon   . IBS (irritable bowel syndrome)   . Chronic lower GI bleeding   . Overactive bladder   . Thrombocytopenia     sees Dr. Julien Nordmann   . Splenomegaly   . Depression   . Hypertension   . Asthma   . Heart murmur   . Hyperlipidemia   . COPD (chronic obstructive pulmonary disease)     sees Dr. Gwenette Greet   . Colostomy care   . Hypercalcemia   . CHF (congestive heart failure)   . Pacemaker 12/14/2013  . 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 Cervical cancer many years ago  . Complication of anesthesia     woke up during  colonscopy and endoscopy in past  . History of blood transfusion "several"    "bleeding via ostomy" (01/04/2014)  . On home oxygen therapy     "2L; 24/7" (01/04/2014)  . Pneumonia "1-2 times"  . Chronic bronchitis "several times"  . Sleep apnea 2012    mild, no cpap needed  . Type II diabetes mellitus     sees Dr. Cruzita Lederer   . Chronic disease anemia     sees Dr. Julien Nordmann, due to chronic disease and GI losses   . History of stomach ulcers   . Migraine     "last one was several years ago" (01/04/2014)  . Arthritis     "tailbone; hands; legs"  (01/04/2014)  . Anxiety   . Pericardial effusion 2007, 2015.   [name] Chronic abdominal pain     IBS  . Cirrhosis of liver     stage 4  . 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  Patient has mild copd.        Past Surgical History  Procedure Laterality Date  . Colostomy  11/06/2005  . Lung removal, partial Left 2004    upper lobe removed  . Left colectomy  11/06/2005    Hartmann resection of sigmoid colon and end colostomy.  . Esophagogastroduodenoscopy  03/19/2011    Procedure: ESOPHAGOGASTRODUODENOSCOPY (EGD);  Surgeon: [name], MD;  Location: [facility];  Service: Endoscopy;  Laterality: N/A;  . Colonoscopy  03/19/2011    Procedure: COLONOSCOPY;  Surgeon: [name], MD;  Location: [facility];  Service: Endoscopy;  Laterality: N/A;  . Givens capsule study  03/20/2011    Procedure: GIVENS CAPSULE STUDY;  Surgeon: [name], MD;  Location: [facility];  Service: Endoscopy;  Laterality: N/A;  . Small bowel obstruction repair  March 2012  . Umbilical hernia repair  March 2012  . Splenectomy, total N/A 02/24/2013    Procedure: SPLENECTOMY;  Surgeon: Harl Bowie, MD;  Location: [facility];  Service: General;  Laterality: N/A;  . Orif patella Left 04/21/2013    Procedure: OPEN REDUCTION INTERNAL (ORIF) FIXATION PATELLA;  Surgeon: Augustin Schooling, MD;  Location: [place];  Service: Orthopedics;  Laterality: Left;  . Pacemaker insertion  2015  . Esophagogastroduodenoscopy (egd) with propofol N/A 11/02/2013    Procedure: ESOPHAGOGASTRODUODENOSCOPY (EGD) WITH PROPOFOL;  Surgeon: [name], MD;  Location: [facility];  Service: Endoscopy;  Laterality: N/A;  EGD with APC  . Hot hemostasis N/A 11/02/2013    Procedure: HOT HEMOSTASIS (ARGON PLASMA COAGULATION/BICAP);  Surgeon: [name], MD;  Location: [facility];  Service: Endoscopy;  Laterality:  N/A;  . Colon surgery    . Hernia repair    . Fracture surgery    . Abdominal hysterectomy    . Tubal ligation    . Tonsillectomy  1959  . Appendectomy  1962  . Cholecystectomy    . Cardiac catheterization  1967  . Permanent pacemaker insertion N/A 09/07/2013    Procedure: PERMANENT PACEMAKER INSERTION;  Surgeon: Evans Lance, MD;  Location: [facility] CATH LAB;  Service: Cardiovascular;  Laterality: N/A;  . Tee without cardioversion N/A 01/06/2014    Procedure: TRANSESOPHAGEAL ECHOCARDIOGRAM (TEE);  Surgeon: Lelon Perla, MD;  Location: [facility] ENDOSCOPY;  Service: Cardiovascular;  Laterality: N/A;  . Colostomy  11/06/2005    OPERATIVE REPORT    Prior to Admission medications  Medication Sig Start Date End Date Taking? Authorizing Provider  albuterol (PROVENTIL) (2.5 MG/3ML) 0.083% nebulizer solution Take 3 mLs (2.5 mg total) by nebulization every 4 (four) hours as needed for wheezing or shortness of breath. 03/02/14  Yes [name], MD  albuterol-ipratropium (COMBIVENT) 18-103 MCG/ACT inhaler Inhale 2 puffs into the lungs every 4 (four) hours as needed for wheezing or shortness of breath.    Yes Historical Provider, MD  atorvastatin (LIPITOR) 20 MG tablet Take 1 tablet (20 mg total) by mouth every morning. 03/02/14  Yes [name], MD  ciprofloxacin (CIPRO) 500 MG tablet Take 1 tablet (500 mg total) by mouth 2 (two) times daily. 09/28/14  Yes [name], MD  diazepam (VALIUM) 5 MG tablet Take 5 mg by mouth every 6 (six) hours as needed for anxiety.  08/15/14  Yes Historical Provider, MD  dicyclomine (BENTYL) 10 MG capsule Take 1 capsule (10 mg total) by mouth 3 (three) times daily as needed for spasms. 05/23/14  Yes Domenic Polite, MD  esomeprazole (NEXIUM) 40 MG capsule Take 40 mg by mouth at bedtime.  03/02/14  Yes Historical Provider, MD  Fluticasone-Salmeterol (ADVAIR) 250-50 MCG/DOSE AEPB Inhale 1 puff into the lungs every 12 (twelve) hours. 03/02/14  Yes [name], MD  folic acid  (FOLVITE) 1 MG tablet TAKE ONE TABLET BY MOUTH ONCE DAILY IN THE EVENING 05/10/14  Yes [name], MD  furosemide (LASIX) 40 MG tablet Take 3 tablets (120 mg total) by mouth 2 (two) times daily. 07/04/14  Yes [name], MD  HUMULIN R 500 UNIT/ML injection Inject 33 units before breakfast, 33 units before lunch and 30units before dinner daily as instructed. Patient taking differently: Inject 28-35 Units into the skin 3 (three) times daily with meals. Inject 35 units before breakfast, 28 units before lunch and 35 units before dinner daily as instructed. 09/12/14  Yes Philemon Kingdom, MD  hydrALAZINE (APRESOLINE) 25 MG tablet Take 1 tablet (25 mg total) by mouth 2 (two) times daily. 06/29/14  Yes [name], MD  isosorbide dinitrate (ISORDIL) 20 MG tablet Take 1 tablet (20 mg total) by mouth 2 (two) times daily. 07/26/14  Yes Troy Sine, MD  lactulose (CHRONULAC) 10 GM/15ML solution Take 15 mLs (10 g total) by mouth 3 (three) times daily. Patient taking differently: Take 10 g by mouth 4 (four) times daily.  06/29/14  Yes [name], MD  losartan (COZAAR) 50 MG tablet Take 50 mg by mouth daily.  08/15/14  Yes Historical Provider, MD  metoCLOPramide (REGLAN) 10 MG tablet Take 1 tablet (10 mg total) by mouth 3 (three) times daily. 07/26/14  Yes [name], MD  metroNIDAZOLE (FLAGYL) 500 MG tablet Take 1 tablet (500 mg total) by mouth 3 (three) times daily. 09/28/14  Yes [name], MD  Oxycodone HCl 20 MG TABS Take 1 tablet (20 mg total) by mouth every 3 (three) hours as needed (pain). 06/29/14  Yes [name], MD  polyethylene glycol powder (GLYCOLAX/MIRALAX) powder Take 17 g by mouth 2 (two) times daily as needed. Patient taking differently: Take 17 g by mouth 2 (two) times daily as needed for mild constipation.  08/03/14  Yes [name], MD  potassium chloride (KLOR-CON 10) 10 MEQ tablet Take 1 tablet (10 mEq total) by mouth daily. 03/16/14  Yes [name], MD  promethazine (PHENERGAN) 25  MG tablet Take 1 tablet (25 mg total) by mouth every 4 (four)  hours as needed for nausea or vomiting. 11/23/13  Yes [name], MD  rifaximin (XIFAXAN) 550 MG TABS tablet Take 1 tablet (550 mg total) by mouth 2 (two) times daily. 06/14/14  Yes [name], MD  spironolactone (ALDACTONE) 25 MG tablet Take 0.5 tablets (12.5 mg total) by mouth 2 (two) times daily. 06/29/14  Yes [name], MD  OXYGEN Inhale 2 L into the lungs continuous.    Historical Provider, MD  [place] .3CC/29G 29G X 1/2" 0.3 ML MISC  08/16/14   Historical Provider, MD    Current Facility-Administered Medications  Medication Dose Route Frequency Provider Last Rate Last Dose  . antiseptic oral rinse (CPC / CETYLPYRIDINIUM CHLORIDE 0.05%) solution 7 mL  7 mL Mouth Rinse BID Etta Quill, DO   7 mL at 10/07/14 1008  . atorvastatin (LIPITOR) tablet 20 mg  20 mg Oral q morning - 10a Etta Quill, DO   20 mg at 10/07/14 1006  . diazepam (VALIUM) tablet 5 mg  5 mg Oral Q6H PRN Etta Quill, DO   5 mg at 10/06/14 2213  . dicyclomine (BENTYL) capsule 10 mg  10 mg Oral TID PRN Etta Quill, DO   10 mg at 10/06/14 2136  . folic acid (FOLVITE) tablet 1 mg  1 mg Oral Daily Etta Quill, DO   1 mg at 10/07/14 1007  . furosemide (LASIX) tablet 100 mg  100 mg Oral BID Geradine Girt, DO      . hydrALAZINE (APRESOLINE) tablet 25 mg  25 mg Oral BID Etta Quill, DO   25 mg at 10/07/14 1006  . HYDROmorphone (DILAUDID) injection 1 mg  1 mg Intravenous Q4H PRN Geradine Girt, DO   1 mg at 10/07/14 1156  . insulin aspart (novoLOG) injection 0-15 Units  0-15 Units Subcutaneous TID WC Etta Quill, DO   3 Units at 10/07/14 1146  . insulin aspart (novoLOG) injection 6 Units  6 Units Subcutaneous TID WC Etta Quill, DO   6 Units at 10/07/14 1154  . insulin glargine (LANTUS) injection 20 Units  20 Units Subcutaneous QHS Geradine Girt, DO   20 Units at 10/06/14 2138  . ipratropium-albuterol (DUONEB) 0.5-2.5 (3)  MG/3ML nebulizer solution 3 mL  3 mL Nebulization Q4H PRN Etta Quill, DO      . isosorbide dinitrate (ISORDIL) tablet 20 mg  20 mg Oral BID Etta Quill, DO   20 mg at 10/07/14 1007  . lactulose (CHRONULAC) 10 GM/15ML solution 10 g  10 g Oral BID Geradine Girt, DO   10 g at 10/07/14 1005  . losartan (COZAAR) tablet 50 mg  50 mg Oral Daily Etta Quill, DO   50 mg at 10/07/14 1008  . metoCLOPramide (REGLAN) tablet 10 mg  10 mg Oral TID Etta Quill, DO   10 mg at 10/07/14 1006  . mometasone-formoterol (DULERA) 100-5 MCG/ACT inhaler 2 puff  2 puff Inhalation BID Etta Quill, DO   2 puff at 10/06/14 2046  . ondansetron (ZOFRAN) tablet 4 mg  4 mg Oral Q6H PRN Etta Quill, DO       Or  . ondansetron [facility]) injection 4 mg  4 mg Intravenous Q6H PRN Etta Quill, DO   4 mg at 10/05/14 0148  . oxyCODONE (Oxy IR/ROXICODONE) immediate release tablet 20 mg  20 mg Oral Q3H PRN Geradine Girt, DO   20  mg at 10/07/14 1005  . pantoprazole (PROTONIX) EC tablet 80 mg  80 mg Oral Q1200 Etta Quill, DO   80 mg at 10/07/14 1155  . polyethylene glycol (MIRALAX / GLYCOLAX) packet 17 g  17 g Oral BID PRN Etta Quill, DO      . promethazine (PHENERGAN) tablet 25 mg  25 mg Oral Q4H PRN Etta Quill, DO      . rifaximin Doreene Nest) tablet 550 mg  550 mg Oral BID Etta Quill, DO   550 mg at 10/07/14 1008    Allergies as of 10/03/2014 - Review Complete 10/03/2014  Allergen Reaction Noted  . Acetaminophen Other (See Comments) 06/23/2013  . Morphine Other (See Comments) 09/15/2006  . Morphine and related Other (See Comments) 11/27/2013  . Other Other (See Comments) 02/10/2013  . Penicillins Anaphylaxis and Rash 06/03/2006  . Trazodone and nefazodone Other (See Comments) 09/03/2013  . Codeine phosphate Other (See Comments) 06/03/2006  . Hydrocodone-acetaminophen Other (See Comments) 06/03/2006  . Cephalexin Swelling and Rash 06/03/2006  . Hydrocodone-acetaminophen Other (See  Comments) 06/23/2013    Family History  Problem Relation Age of Onset  . Coronary artery disease    . Diabetes type II    . Heart attack Mother   . Diabetes type II Mother   . Cirrhosis Father   . Heart attack Sister   . Pancreatic cancer Mother     secondary from surgery  . Cervical cancer Maternal Grandmother   . Hypertension Father   . Hypertension Sister   . Hypertension Son   . Hypertension Daughter   . Stroke Neg Hx     Social History   Social History  . Marital Status: Divorced    Spouse Name: N/A  . Number of Children: N/A  . Years of Education: N/A   Occupational History  . Disabled    Social History Main Topics  . Smoking status: Former Smoker -- 2.00 packs/day for 35 years    Types: Cigarettes    Quit date: 03/24/2002  . Smokeless tobacco: Never Used  . Alcohol Use: No  . Drug Use: No  . Sexual Activity: No   Other Topics Concern  . Not on file   Social History Narrative   Regular exercise: a little   Caffeine use: 2 cups of coffee in am          Review of Systems: Pertinent positive and negative review of systems were noted in the above HPI section.  All other review of systems was otherwise negative. Physical Exam: Vital signs in last 24 hours: Temp:  [98.6 F (37 C)] 98.6 F (37 C) (09/09 0359) Pulse Rate:  [60-69] 60 (09/09 0359) Resp:  [16] 16 (09/09 0359) BP: (119-142)/(37-39) 119/37 mmHg (09/09 0359) SpO2:  [98 %-100 %] 98 % (09/09 0359) Weight:  [204 lb 4.8 oz (92.67 kg)] 204 lb 4.8 oz (92.67 kg) (09/09 0538) Last BM Date: 10/07/14 General:   Alert,  Well-developed, chronically ill appearing WF, pleasant and cooperative in NAD Head:  Normocephalic and atraumatic. Eyes:  Sclera clear, no icterus.   Conjunctiva pink. Ears:  Normal auditory acuity. Nose:  No deformity, discharge,  or lesions. Mouth:  No deformity or lesions.   Neck:  Supple; no masses or thyromegaly. Lungs:  Clear throughout to auscultation.   No wheezes,  crackles, or rhonchi.decreased BS Heart:  Regular rate and rhythm; no murmurs, clicks, rubs,  or gallops. Abdomen:  Soft,very tender RLQ and RMQ, no palp  mass,BS active,nonpalpHSM, colostomy LLQ with peristomal hernia Rectal:  Deferred  Msk:  Symmetrical without gross deformities. . Pulses:  Normal pulses noted. Extremities:  Without clubbing or edema. Neurologic:  Alert and  oriented x4;  grossly normal neurologically. Skin:  Intact without significant lesions or rashes.. Psych:  Alert and cooperative. Normal mood and affect.  Intake/Output from previous day: 09/08 0701 - 09/09 0700 In: 1320 [P.O.:1320] Out: 1800 [Urine:1800] Intake/Output this shift: Total I/O In: -  Out: 1225 [Urine:1225]  Lab Results:  Recent Labs  10/05/14 0116 10/06/14 0600 10/07/14 0515  WBC 14.7* 12.1* 19.7*  HGB 9.9* 9.4* 9.5*  HCT 31.1* 28.7* 29.4*  PLT 378 369 337   BMET  Recent Labs  10/05/14 0116 10/06/14 0600 10/07/14 0515  NA 134* 130* 126*  K 5.2* 4.9 5.2*  CL 100* 99* 93*  CO2 '24 26 25  '$ GLUCOSE 205* 153* 155*  BUN 47* 35* 33*  CREATININE 1.46* 1.19* 1.17*  CALCIUM 11.2* 10.8* 10.7*   LFT No results for input(s): PROT, ALBUMIN, AST, ALT, ALKPHOS, BILITOT, BILIDIR, IBILI in the last 72 hours. PT/INR No results for input(s): LABPROT, INR in the last 72 hours. Hepatitis Panel No results for input(s): HEPBSAG, HCVAB, HEPAIGM, HEPBIGM in the last 72 hours.   IMPRESSION:  #81 64 year old female with progressive right lower quadrant pain. Patient has an enlarging right pelvic multiloculated mass. I suspect this is the etiology of her right lower quadrant pain, regardless whether or not it may be malignant it is occupying space and gradually growing. #2 compensated cirrhosis #3 COPD-O2 dependent #4 adult-onset diabetes mellitus #5 chronic CHF #6 status post remote permanent colostomy-with chronic peristomal hernia, and chronic intermittent low-grade GI blood loss from stomal  inflammation #7 status post splenectomy #8 history of lung cancer 2004,remote cervical cancer #9 chronic narcotic use #10 chronic anemia  Plan; Do not feel that further GI workup is indicated at this time Patient would like a second opinion from GYN oncology and also would like to discuss with Dr. Earlie Server who she knows very well.  Would ask GYN oncology to see her as an inpatient and further discuss with patient. If no therapy or surgery can be offered then pain control may be the main goal.   Amy Esterwood  10/07/2014, 3:07 PM   ________________________________________________________________________  Velora Heckler GI MD note:  I personally examined the patient, reviewed the data and agree with the assessment and plan described above.  Her pain is not related to GI tract.  The ovarian process (by CT) has grown from 2cm to 9.8cm in less than a year (9/16 CT vs. 12/15 CT).  I think that process needs to be better understood and she may benefit from surgery.    Please call or page with any further questions or concerns.    Owens Loffler, MD [facility] Gastroenterology Pager [phone]

## 2014-10-07 NOTE — Progress Notes (Signed)
Physical Therapy Treatment Patient Details Name: [name] MRN: [id number] DOB: August 19, 1950 Today's Date: 10/07/2014    History of Present Illness [name] is a 64 y.o. female with numerous medical problems including multiple abdominal surgeries, pelvic mass, ongoing GIB bloody output from ostomy since earlier this year, CAD. Patient presents to the ED with 1 week history of abdominal pain.    PT Comments    Pt was unable to walk as far today due to fatigue, but still wanted to get up and move around some to decrease her stiffness and to try to prevent getting weaker while here in the hospital.  Next session, if able, she would benefit from another person to follow her with a chair.  I believe she would go further without fear of being too far away from the room.  PT will continue to follow acutely.   Follow Up Recommendations  Home health PT;Supervision for mobility/OOB     Equipment Recommendations  Wheelchair (measurements PT);Wheelchair cushion (measurements PT)    Recommendations for Other Services   NA     Precautions / Restrictions Precautions Precautions: Fall Precaution Comments: reports 3 falls in 6 months, in part due to dizziness, in part due to left knee weakness    Mobility  Bed Mobility               General bed mobility comments: pt up in the chair  Transfers Overall transfer level: Needs assistance Equipment used: Rolling walker (2 wheeled) Transfers: Sit to/from Stand Sit to Stand: Min guard         General transfer comment: Min guard assist from both low recliner chair and higher BSC.  Verbal cues for safe hand placmeent.  Min guard assist for safety.   Ambulation/Gait Ambulation/Gait assistance: Min assist Ambulation Distance (Feet): 75 Feet Assistive device: Rolling walker (2 wheeled) Gait Pattern/deviations: Step-through pattern;Shuffle Gait velocity: decreased Gait velocity interpretation: <1.8 ft/sec, indicative of risk for  recurrent falls General Gait Details: pt with more limited gait distance today due to fatigue and dizziness.  No seated rest breaks, but pt did not go nearly as far.  She would benefit from second person chair to follow to encourage increased gait distance.           Balance           Standing balance support: Bilateral upper extremity supported Standing balance-Leahy Scale: Poor                      Cognition Arousal/Alertness: Awake/alert Behavior During Therapy: WFL for tasks assessed/performed Overall Cognitive Status: Within Functional Limits for tasks assessed                      Exercises General Exercises - Upper Extremity Shoulder Flexion: AAROM;Both;10 reps;Seated Elbow Flexion: AAROM;Both;10 reps;Seated General Exercises - Lower Extremity Long Arc Quad: AROM;Both;10 reps;Seated Hip ABduction/ADduction: AROM;Both;10 reps;Seated Hip Flexion/Marching: AROM;Both;10 reps;Seated Toe Raises: AROM;Both;10 reps;Seated Heel Raises: AROM;Both;10 reps;Seated        Pertinent Vitals/Pain Pain Assessment: 0-10 Pain Score: 7  Pain Location: abdomen Pain Descriptors / Indicators: Aching Pain Intervention(s): Limited activity within patient's tolerance;Monitored during session;Repositioned;Premedicated before session           PT Goals (current goals can now be found in the care plan section) Acute Rehab PT Goals Patient Stated Goal: To return to independent Progress towards PT goals: Progressing toward goals    Frequency  Min 3X/week  PT Plan Current plan remains appropriate       End of Session Equipment Utilized During Treatment: Oxygen Activity Tolerance: Patient limited by fatigue Patient left: in chair;with call bell/phone within reach     Time: 7793-9688 PT Time Calculation (min) (ACUTE ONLY): 37 min  Charges:  $Gait Training: 8-22 mins $Therapeutic Exercise: 8-22 mins                      Uel Davidow B. Marrietta Thunder, [place], DPT  [phone]   10/07/2014, 1:49 PM

## 2014-10-07 NOTE — Progress Notes (Addendum)
PROGRESS NOTE  [name] XVQ:[id number] DOB: February 10, 1950 DOA: 10/03/2014 PCP: [name], MD  [name] is a 64 y.o. female with numerous medical problems including multiple abdominal surgeries, pelvic mass, ongoing GIB bloody output from ostomy since earlier this year, CAD. Patient presents to the ED with 1 week history of abdominal pain. Seen by PCP who put her on cipro/flagyl. Pain has since worsened and she came to the ER.  Assessment/Plan: Abdominal pain, right side was seen by Dr. Denman George in the past for pelvic mass- had CT scan, U/S- spoke with Dr. Denman George she does not think this is related or cancerous with normal CA 125 Resume home pain meds with PRN dilaudid -appears to be chronic component when compared to previous admissions asking for dilaudid by name- eating 100% of meals -will ask GI to see if anything else can be added -viral stool studies  Chest pain - resolved given chronic and ongoing GIB patient is not a candidate for anticoagulation at this time including ASA, plavix, heparin, etc.  Chronic GIB - She is actually seeing heme-onc for this and getting epogyn injections Does not seem to be anything that GI can do about it (see prior GI notes). -appears resolved  AKI -renal U/S ok Resume lasix at lower dose  DM -  SSI AC/HS moderate dose With 6 units mealtime coverage Increase lantus  Hyperammonemia - continue lactulose scheduled  Thyroid nodule -  TSH low- free t4 high -spoke with endocrine 9/8-- repeat TSH, free t4, free t3 and TSI-- can start methimazole 5 mg if needed and follow up with Dr. Renne Crigler  Leukocytosis -not sure what from -no fever -monitor  Hyponatremia -volume overloaded -trend  Code Status: full Family Communication:  Disposition Plan:    Consultants:    Procedures:      HPI/Subjective: Still with pain  Objective: Filed Vitals:   10/07/14 0359  BP: 119/37  Pulse: 60  Temp: 98.6 F (37 C)  Resp: 16     Intake/Output Summary (Last 24 hours) at 10/07/14 1245 Last data filed at 10/07/14 1216  Gross per 24 hour  Intake    720 ml  Output   2425 ml  Net  -1705 ml   Filed Weights   10/04/14 0436 10/06/14 0425 10/07/14 0538  Weight: 90.81 kg (200 lb 3.2 oz) 91.627 kg (202 lb) 92.67 kg (204 lb 4.8 oz)    Exam:   General:  Awake, NAD  Cardiovascular: rrr  Respiratory: clear  Abdomen: +BS, soft, tender to palpation  Musculoskeletal: +LE edema  Data Reviewed: Basic Metabolic Panel:  Recent Labs Lab 10/03/14 1608 10/05/14 0116 10/06/14 0600 10/07/14 0515  NA 135 134* 130* 126*  K 5.3* 5.2* 4.9 5.2*  CL 105 100* 99* 93*  CO2 '22 24 26 25  '$ GLUCOSE 76 205* 153* 155*  BUN 49* 47* 35* 33*  CREATININE 1.47* 1.46* 1.19* 1.17*  CALCIUM 11.5* 11.2* 10.8* 10.7*   Liver Function Tests:  Recent Labs Lab 10/03/14 1608  AST 39  ALT 39  ALKPHOS 60  BILITOT 0.3  PROT 6.0*  ALBUMIN 3.1*    Recent Labs Lab 10/03/14 1608  LIPASE 60*    Recent Labs Lab 10/03/14 1602  AMMONIA 79*   CBC:  Recent Labs Lab 10/03/14 1608 10/05/14 0116 10/06/14 0600 10/07/14 0515  WBC 17.7* 14.7* 12.1* 19.7*  HGB 9.5* 9.9* 9.4* 9.5*  HCT 30.3* 31.1* 28.7* 29.4*  MCV 88.3 87.6 86.4 88.0  PLT 419* 378 369 337  Cardiac Enzymes:  Recent Labs Lab 10/04/14 0136 10/04/14 0741 10/04/14 1621  TROPONINI 0.06* 0.05* 0.04*   BNP (last 3 results)  Recent Labs  04/12/14 0710 05/15/14 2322 05/21/14 0427  BNP 311.9* 918.6* 120.7*    ProBNP (last 3 results)  Recent Labs  03/30/14 1230  PROBNP 71.0    CBG:  Recent Labs Lab 10/06/14 1119 10/06/14 1643 10/06/14 2114 10/07/14 0731 10/07/14 1138  GLUCAP 191* 157* 152* 139* 151*    Recent Results (from the past 240 hour(s))  MRSA PCR Screening     Status: None   Collection Time: 10/03/14 10:48 PM  Result Value Ref Range Status   MRSA by PCR NEGATIVE NEGATIVE Final    Comment:        The GeneXpert MRSA Assay  (FDA approved for NASAL specimens only), is one component of a comprehensive MRSA colonization surveillance program. It is not intended to diagnose MRSA infection nor to guide or monitor treatment for MRSA infections.      Studies: US Renal  10/05/2014   CLINICAL DATA:  Acute kidney injury.  EXAM: RENAL / URINARY TRACT ULTRASOUND COMPLETE  COMPARISON:  None.  FINDINGS: Right Kidney:  Length: 10.8 cm. Echogenicity within normal limits. No mass or hydronephrosis visualized.  Left Kidney:  Length: 10.4 cm. Echogenicity within normal limits. No mass or hydronephrosis visualized.  Bladder:  Appears normal for degree of bladder distention.  IMPRESSION: Negative for hydronephrosis.  Negative exam.   Electronically Signed   By: Inge Rise M.D.   On: 10/05/2014 19:46    Scheduled Meds: . antiseptic oral rinse  7 mL Mouth Rinse BID  . atorvastatin  20 mg Oral q morning - 03J  . folic acid  1 mg Oral Daily  . furosemide  80 mg Oral BID  . hydrALAZINE  25 mg Oral BID  . insulin aspart  0-15 Units Subcutaneous TID WC  . insulin aspart  6 Units Subcutaneous TID WC  . insulin glargine  20 Units Subcutaneous QHS  . isosorbide dinitrate  20 mg Oral BID  . lactulose  10 g Oral BID  . losartan  50 mg Oral Daily  . metoCLOPramide  10 mg Oral TID  . mometasone-formoterol  2 puff Inhalation BID  . pantoprazole  80 mg Oral Q1200  . rifaximin  550 mg Oral BID   Continuous Infusions:   Antibiotics Given (last 72 hours)    Date/Time Action Medication Dose   10/04/14 2252 Given   rifaximin (XIFAXAN) tablet 550 mg 550 mg   10/05/14 1016 Given   rifaximin (XIFAXAN) tablet 550 mg 550 mg   10/05/14 2303 Given   rifaximin (XIFAXAN) tablet 550 mg 550 mg   10/06/14 1016 Given   rifaximin (XIFAXAN) tablet 550 mg 550 mg   10/06/14 2137 Given   rifaximin (XIFAXAN) tablet 550 mg 550 mg   10/07/14 1008 Given   rifaximin (XIFAXAN) tablet 550 mg 550 mg      Principal Problem:   Abdominal pain,  generalized Active Problems:   Chest pain   Anemia due to chronic blood loss   Hyperammonemia   Chronic diastolic CHF (congestive heart failure), NYHA class 4   Chronic GI bleeding   Pelvic mass in female    Time spent: 25 min    [name] [name]  [facility] Pager [phone] If 7PM-7AM, please contact night-coverage at [email or web address], password [facility] 10/07/2014, 12:45 PM  LOS: 4 days

## 2014-10-08 DIAGNOSIS — R1900 Intra-abdominal and pelvic swelling, mass and lump, unspecified site: Secondary | ICD-10-CM

## 2014-10-08 LAB — BASIC METABOLIC PANEL
ANION GAP: 11 (ref 5–15)
BUN: 36 mg/dL — ABNORMAL HIGH (ref 6–20)
CHLORIDE: 90 mmol/L — AB (ref 101–111)
CO2: 22 mmol/L (ref 22–32)
Calcium: 10.6 mg/dL — ABNORMAL HIGH (ref 8.9–10.3)
Creatinine, Ser: 1.12 mg/dL — ABNORMAL HIGH (ref 0.44–1.00)
GFR calc Af Amer: 60 mL/min — ABNORMAL LOW (ref >60)
GFR calc non Af Amer: 52 mL/min — ABNORMAL LOW (ref >60)
GLUCOSE: 145 mg/dL — AB (ref 65–99)
POTASSIUM: 5.3 mmol/L — AB (ref 3.5–5.1)
Sodium: 123 mmol/L — ABNORMAL LOW (ref 135–145)

## 2014-10-08 LAB — CBC WITH DIFFERENTIAL/PLATELET
Basophils Absolute: 0 K/uL (ref 0.0–0.1)
Basophils Relative: 0 % (ref 0–1)
Eosinophils Absolute: 0.5 K/uL (ref 0.0–0.7)
Eosinophils Relative: 3 % (ref 0–5)
HCT: 30.6 % — ABNORMAL LOW (ref 36.0–46.0)
Hemoglobin: 9.9 g/dL — ABNORMAL LOW (ref 12.0–15.0)
Lymphocytes Relative: 15 % (ref 12–46)
Lymphs Abs: 2.5 K/uL (ref 0.7–4.0)
MCH: 28.4 pg (ref 26.0–34.0)
MCHC: 32.4 g/dL (ref 30.0–36.0)
MCV: 87.7 fL (ref 78.0–100.0)
Monocytes Absolute: 2.7 K/uL — ABNORMAL HIGH (ref 0.1–1.0)
Monocytes Relative: 16 % — ABNORMAL HIGH (ref 3–12)
Neutro Abs: 10.9 K/uL — ABNORMAL HIGH (ref 1.7–7.7)
Neutrophils Relative %: 66 % (ref 43–77)
Platelets: 334 K/uL (ref 150–400)
RBC: 3.49 MIL/uL — ABNORMAL LOW (ref 3.87–5.11)
RDW: 25 % — ABNORMAL HIGH (ref 11.5–15.5)
WBC: 16.6 K/uL — ABNORMAL HIGH (ref 4.0–10.5)

## 2014-10-08 LAB — GLUCOSE, CAPILLARY
Glucose-Capillary: 158 mg/dL — ABNORMAL HIGH (ref 65–99)
Glucose-Capillary: 158 mg/dL — ABNORMAL HIGH (ref 65–99)
Glucose-Capillary: 152 mg/dL — ABNORMAL HIGH (ref 65–99)
Glucose-Capillary: 195 mg/dL — ABNORMAL HIGH (ref 65–99)

## 2014-10-08 MED ORDER — FENTANYL 25 MCG/HR TD PT72
25.0000 ug | MEDICATED_PATCH | TRANSDERMAL | Status: DC
  Administered 2014-10-08: 25 ug via TRANSDERMAL
  Filled 2014-10-08: qty 1
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
## 2014-10-08 NOTE — Progress Notes (Signed)
PROGRESS NOTE  [name] [id number]:[id number] DOB: [birth year] DOA: 10/03/2014 PCP: [name], MD  [name] is a 64 y.o. female with numerous medical problems including multiple abdominal surgeries, pelvic mass, ongoing GIB bloody output from ostomy since earlier this year, CAD. Patient presents to the ED with 1 week history of abdominal pain. Seen by PCP who put her on cipro/flagyl. Pain has since worsened and she came to the ER.  Assessment/Plan: Abdominal pain, right side was seen by Dr. Denman George in the past for pelvic mass- had CT scan, U/S- Dr. [name] spoke with Dr. Denman George she does not think this is related or cancerous with normal CA 125 Resume home pain meds with PRN dilaudid -appears to be chronic component when compared to previous admissions asking for dilaudid by name- eating 100% of meals GI rec gyn consult as inpatient for second opinion. Feels pain related to enlarging right adnexal cyst regardless whether benign or malignant.  Trying to contact. Number is terminally busy. Paged Dr. [name] at [phone] -viral stool studies Dr. Earlie Server is not working this weekend and would not likely have anything to add Chest pain - resolved given chronic and ongoing GIB patient is not a candidate for anticoagulation at this time including ASA, plavix, heparin, etc.  Chronic GIB - She is actually seeing heme-onc for this and getting epogyn injections Does not seem to be anything that GI can do about it (see prior GI notes). -appears resolved  AKI -renal U/S ok Resume lasix at lower dose  DM -  SSI AC/HS moderate dose With 6 units mealtime coverage Increase lantus  Hyperammonemia - continue lactulose scheduled  Thyroid nodule -  TSH low- free t4 high -spoke with endocrine 9/8-- repeat TSH, free t4, free t3 and TSI-- can start methimazole 5 mg if needed and follow up with Dr. Renne Crigler  Leukocytosis -not sure what from -no fever Repeat today  Hyponatremia -volume  overloaded -trend  Code Status: full Family Communication:  Disposition Plan:    Consultants:    Procedures:      HPI/Subjective: Asking for gyn consult and to see Dr. Earlie Server. Wants pain medication accellerated  Objective: Filed Vitals:   10/08/14 0720  BP: 117/37  Pulse: 65  Temp: 98.4 F (36.9 C)  Resp: 20    Intake/Output Summary (Last 24 hours) at 10/08/14 1248 Last data filed at 10/08/14 0911  Gross per 24 hour  Intake    340 ml  Output   1900 ml  Net  -1560 ml   Filed Weights   10/06/14 0425 10/07/14 0538 10/08/14 0356  Weight: 91.627 kg (202 lb) 92.67 kg (204 lb 4.8 oz) 93.123 kg (205 lb 4.8 oz)    Exam:   General:  tearfi;  Cardiovascular: rrr  Respiratory: clear  Abdomen: +BS, soft,   Musculoskeletal: +LE edema  Data Reviewed: Basic Metabolic Panel:  Recent Labs Lab 10/03/14 1608 10/05/14 0116 10/06/14 0600 10/07/14 0515  NA 135 134* 130* 126*  K 5.3* 5.2* 4.9 5.2*  CL 105 100* 99* 93*  CO2 '22 24 26 25  '$ GLUCOSE 76 205* 153* 155*  BUN 49* 47* 35* 33*  CREATININE 1.47* 1.46* 1.19* 1.17*  CALCIUM 11.5* 11.2* 10.8* 10.7*   Liver Function Tests:  Recent Labs Lab 10/03/14 1608  AST 39  ALT 39  ALKPHOS 60  BILITOT 0.3  PROT 6.0*  ALBUMIN 3.1*    Recent Labs Lab 10/03/14 1608  LIPASE 60*    Recent Labs Lab 10/03/14 1602  AMMONIA 79*   CBC:  Recent Labs Lab 10/03/14 1608 10/05/14 0116 10/06/14 0600 10/07/14 0515  WBC 17.7* 14.7* 12.1* 19.7*  HGB 9.5* 9.9* 9.4* 9.5*  HCT 30.3* 31.1* 28.7* 29.4*  MCV 88.3 87.6 86.4 88.0  PLT 419* 378 369 337   Cardiac Enzymes:  Recent Labs Lab 10/04/14 0136 10/04/14 0741 10/04/14 1621  TROPONINI 0.06* 0.05* 0.04*   BNP (last 3 results)  Recent Labs  04/12/14 0710 05/15/14 2322 05/21/14 0427  BNP 311.9* 918.6* 120.7*    ProBNP (last 3 results)  Recent Labs  03/30/14 1230  PROBNP 71.0    CBG:  Recent Labs Lab 10/07/14 0731 10/07/14 1138  10/07/14 2047 10/08/14 0718 10/08/14 1058  GLUCAP 139* 151* 156* 158* 158*    Recent Results (from the past 240 hour(s))  MRSA PCR Screening     Status: None   Collection Time: 10/03/14 10:48 PM  Result Value Ref Range Status   MRSA by PCR NEGATIVE NEGATIVE Final    Comment:        The GeneXpert MRSA Assay (FDA approved for NASAL specimens only), is one component of a comprehensive MRSA colonization surveillance program. It is not intended to diagnose MRSA infection nor to guide or monitor treatment for MRSA infections.      Studies: No results found.  Scheduled Meds: . antiseptic oral rinse  7 mL Mouth Rinse BID  . atorvastatin  20 mg Oral q morning - 17O  . folic acid  1 mg Oral Daily  . furosemide  100 mg Oral BID  . hydrALAZINE  25 mg Oral BID  . insulin aspart  0-15 Units Subcutaneous TID WC  . insulin aspart  6 Units Subcutaneous TID WC  . insulin glargine  20 Units Subcutaneous QHS  . isosorbide dinitrate  20 mg Oral BID  . lactulose  10 g Oral BID  . losartan  50 mg Oral Daily  . metoCLOPramide  10 mg Oral TID  . mometasone-formoterol  2 puff Inhalation BID  . pantoprazole  80 mg Oral Q1200  . rifaximin  550 mg Oral BID   Continuous Infusions:   Antibiotics Given (last 72 hours)    Date/Time Action Medication Dose   10/05/14 2303 Given   rifaximin (XIFAXAN) tablet 550 mg 550 mg   10/06/14 1016 Given   rifaximin (XIFAXAN) tablet 550 mg 550 mg   10/06/14 2137 Given   rifaximin (XIFAXAN) tablet 550 mg 550 mg   10/07/14 1008 Given   rifaximin (XIFAXAN) tablet 550 mg 550 mg   10/07/14 2206 Given   rifaximin (XIFAXAN) tablet 550 mg 550 mg   10/08/14 0907 Given   rifaximin (XIFAXAN) tablet 550 mg 550 mg      Principal Problem:   Abdominal pain, generalized Active Problems:   Chest pain   Anemia due to chronic blood loss   Hyperammonemia   Chronic diastolic CHF (congestive heart failure), NYHA class 4   Chronic GI bleeding   Pelvic mass in  female  Time spent: 25 min  [facility] [email or web address], password [facility] 10/08/2014, 12:48 PM  LOS: 5 days

## 2014-10-09 LAB — BASIC METABOLIC PANEL
Anion gap: 9 (ref 5–15)
BUN: 40 mg/dL — AB (ref 6–20)
CHLORIDE: 94 mmol/L — AB (ref 101–111)
CO2: 24 mmol/L (ref 22–32)
CREATININE: 1.22 mg/dL — AB (ref 0.44–1.00)
Calcium: 11.1 mg/dL — ABNORMAL HIGH (ref 8.9–10.3)
GFR calc Af Amer: 54 mL/min — ABNORMAL LOW (ref >60)
GFR calc non Af Amer: 46 mL/min — ABNORMAL LOW (ref >60)
GLUCOSE: 135 mg/dL — AB (ref 65–99)
Potassium: 4.9 mmol/L (ref 3.5–5.1)
Sodium: 127 mmol/L — ABNORMAL LOW (ref 135–145)

## 2014-10-09 LAB — GLUCOSE, CAPILLARY
GLUCOSE-CAPILLARY: 127 mg/dL — AB (ref 65–99)
Glucose-Capillary: 168 mg/dL — ABNORMAL HIGH (ref 65–99)

## 2014-10-09 LAB — CORTISOL: Cortisol, Plasma: 2.6 ug/dL

## 2014-10-09 MED ORDER — FENTANYL 25 MCG/HR TD PT72: 25.0000 ug | MEDICATED_PATCH | TRANSDERMAL | Status: DC

## 2014-10-09 NOTE — Discharge Instructions (Signed)

## 2014-10-09 NOTE — Care Management Note (Signed)
Case Management Note  Patient Details  Name: [name] MRN: [id number] Date of Birth: January 14, 1951  Subjective/Objective:                  HX:  including multiple abdominal surgeries, pelvic mass, ongoing GIB bloody output from ostomy since earlier this year, CAD;  1 week history of abdominal pain. Seen by PCP who put her on cipro/flagyl. Pain has since worsened and now she developed productive cough with brown sputum and pleuritic left sided chest pain.  Action/Plan: CM spoke to pt over the phone and offered pt choice and pt states that she previously saw [facility] for [facility] services and would like to use them again. CM spoke to Guinda with [facility] and advised of resumption order for [place] advised CM to fax order to [phone] and put to the attn of PJ. No additional needs communicated for discharge planning. CM available if additional needs arise.   Expected Discharge Date:  10/09/14               Expected Discharge Plan:  [place]  In-House Referral:     Discharge planning Services  CM Consult  Post Acute Care Choice:  [place], Resumption of Svcs/PTA Provider Choice offered to:  Patient  DME Arranged:    DME Agency:     HH Arranged:  PT HH Agency:  [place]  Status of Service:  Completed, signed off  Medicare Important Message Given:  Yes-third notification given Date Medicare IM Given:    Medicare IM give by:    Date Additional Medicare IM Given:    Additional Medicare Important Message give by:     If discussed at [place] of Stay Meetings, dates discussed:    Additional Comments:  Guido Sander, RN 10/09/2014, 10:07 AM

## 2014-10-09 NOTE — Discharge Summary (Signed)
Physician Discharge Summary  [name] YHC:[id number] DOB: 29-Jan-1951 DOA: 10/03/2014  PCP: [name], MD  Admit date: 10/03/2014 Discharge date: 10/09/2014  Time spent: Greater than 30 minutes  Recommendations for Outpatient Follow-up:  1. To follow-up with Dr. Denman George this week 2. Consider referral to pain management 3. Repeat thyroid function tests as an outpatient. 4. BMET  Discharge Diagnoses:  Principal Problem:   Abdominal pain, acute on chronic Active Problems:   Chest pain   Anemia due to chronic blood loss   Hyperammonemia   Chronic diastolic CHF (congestive heart failure)   Chronic GI bleeding   Right Adnexal cyst  leukocytosis, likely secondary to recent course of steroids, no evidence of ongoing infection Hyperkalemia Hyponatremia secondary to volume overload  Discharge Condition: Stable  Diet recommendation: Heart healthy  Filed Weights   10/06/14 0425 10/07/14 0538 10/08/14 0356  Weight: 91.627 kg (202 lb) 92.67 kg (204 lb 4.8 oz) 93.123 kg (205 lb 4.8 oz)    History of present illness/Hospital Course:  64 y.o. female with numerous medical problems including multiple abdominal surgeries, pelvic mass, ongoing GIB bloody output from ostomy since earlier this year, CAD. Patient presents to the ED with 1 week history of abdominal pain. Seen by PCP who put her on cipro/flagyl. Pain has since worsened and she came to the ER.  Abdominal pain, right side was seen by Dr. Denman George in the past for pelvic mass- had CT scan, U/S- Dr. [name] spoke with Dr. Denman George she does not think this is related or cancerous with normal CA 125. Also, per her note, patient was not felt to be an operative candidate due to multiple medical problems including cirrhosis Resume home pain meds with PRN dilaudid. GI consulted and felt no GI workup necessary. Right lower quadrant pain might be related to adnexal cyst. Recommended inpatient gynecology oncology consult for second opinion per  patient request. Discussed with Dr. [name], on call for gynecology. He did not see the need for an inpatient consult as he would not intervene if gynecology oncology outpatient was not an operative candidate and that this was a benign lesion. Discussed with Dr. [name] who is on-call for Dr. Denman George. She recommends pain control and will schedule a follow-up visit with Dr. Denman George this week in the office. Added Duragesic which helped patient's pain. She is all ready on high doses of oxycodone: 20 mg every 3 hours as needed. Consider referral to pain management. Patient did seem to have some elements of drug-seeking behavior while hospitalized. -appears to be chronic component when compared to previous admissions asking for dilaudid by name- eating 100% of meals -viral stool studies pending.  Chest pain - resolved. Doubt cardiac given chronic and ongoing GIB patient is not a candidate for anticoagulation at this time including ASA, plavix, heparin, etc.  Chronic GIB - She is actually seeing heme-onc for this and getting epogyn injections Seen by GI during hospitalization. No interventions recommended. Has had multiple evaluations by them in the past. -appears resolved  AKI -renal U/S ok Resume lasix at discharge  DM - Remained controlled during hospitalization.  Hyperammonemia - continue lactulose and Xifaxan  Thyroid nodule - Thyroid function tests abnormal. Will need repeat. Dr. [name] discussed with endocrinology.  Leukocytosis Was on prednisone prior to admission. -no fever or signs of ongoing infection  Hyponatremia -volume overloaded  Hyperkalemia: ARB and spironolactone stopped. Normal at discharge  Procedures:  None  Consultations:  GI  Gynecology and gynecology oncology by phone  Discharge Exam: Filed Vitals:   10/09/14 0445  BP: 142/45  Pulse: 67  Temp: 99 F (37.2 C)  Resp: 20    General: Comfortable. Pleasant and cooperative. Oriented. Cardiovascular:  Regular rate rhythm without murmurs gallops rubs Respiratory: Clear to auscultation bilaterally without wheezes rhonchi or rales Abdomen soft Extremities no edema, no asterixis  Discharge Instructions   Discharge Instructions    AMB Referral to [facility]    Complete by:  As directed   Please assign to [place]. Patient currently in hospital. Has multiple co-morbidities. Also active with home health. Consents obtained. Please call with questions. [name], [place], [facility] [phone]  Reason for consult:  Please assign to [place]  Expected date of contact:  1-3 days (reserved for hospital discharges)     Diet - low sodium heart healthy    Complete by:  As directed      Discharge instructions    Complete by:  As directed   Stop cozaar. Stop cipro. Stop metronidazole. Stop spironolactone     Increase activity slowly    Complete by:  As directed           Current Discharge Medication List    START taking these medications   Details  fentaNYL (DURAGESIC - DOSED MCG/HR) 25 MCG/HR patch Place 1 patch (25 mcg total) onto the skin every 3 (three) days. Qty: 5 patch, Refills: 0      CONTINUE these medications which have NOT CHANGED   Details  albuterol (PROVENTIL) (2.5 MG/3ML) 0.083% nebulizer solution Take 3 mLs (2.5 mg total) by nebulization every 4 (four) hours as needed for wheezing or shortness of breath. Qty: 75 mL, Refills: 12    albuterol-ipratropium (COMBIVENT) 18-103 MCG/ACT inhaler Inhale 2 puffs into the lungs every 4 (four) hours as needed for wheezing or shortness of breath.     atorvastatin (LIPITOR) 20 MG tablet Take 1 tablet (20 mg total) by mouth every morning. Qty: 90 tablet, Refills: 3    diazepam (VALIUM) 5 MG tablet Take 5 mg by mouth every 6 (six) hours as needed for anxiety.    Associated Diagnoses: Iron deficiency anemia    dicyclomine (BENTYL) 10 MG capsule Take 1 capsule (10 mg total) by mouth 3  (three) times daily as needed for spasms.    esomeprazole (NEXIUM) 40 MG capsule Take 40 mg by mouth at bedtime.     Fluticasone-Salmeterol (ADVAIR) 250-50 MCG/DOSE AEPB Inhale 1 puff into the lungs every 12 (twelve) hours. Qty: 180 each, Refills: 3    folic acid (FOLVITE) 1 MG tablet TAKE ONE TABLET BY MOUTH ONCE DAILY IN THE EVENING Qty: 90 tablet, Refills: 0    furosemide (LASIX) 40 MG tablet Take 3 tablets (120 mg total) by mouth 2 (two) times daily. Qty: 180 tablet, Refills: 11    HUMULIN R 500 UNIT/ML injection Inject 33 units before breakfast, 33 units before lunch and 30units before dinner daily as instructed. Qty: 30 mL, Refills: 1    hydrALAZINE (APRESOLINE) 25 MG tablet Take 1 tablet (25 mg total) by mouth 2 (two) times daily. Qty: 60 tablet, Refills: 11    isosorbide dinitrate (ISORDIL) 20 MG tablet Take 1 tablet (20 mg total) by mouth 2 (two) times daily. Qty: 180 tablet, Refills: 3    lactulose (CHRONULAC) 10 GM/15ML solution Take 15 mLs (10 g total) by mouth 3 (three) times daily. Qty: 1892 mL, Refills: 11    metoCLOPramide (REGLAN) 10 MG tablet  Take 1 tablet (10 mg total) by mouth 3 (three) times daily. Qty: 270 tablet, Refills: 1    Oxycodone HCl 20 MG TABS Take 1 tablet (20 mg total) by mouth every 3 (three) hours as needed (pain). Qty: 240 tablet, Refills: 0    polyethylene glycol powder (GLYCOLAX/MIRALAX) powder Take 17 g by mouth 2 (two) times daily as needed. Qty: 3350 g, Refills: 3    potassium chloride (KLOR-CON 10) 10 MEQ tablet Take 1 tablet (10 mEq total) by mouth daily. Qty: 30 tablet, Refills: 11    promethazine (PHENERGAN) 25 MG tablet Take 1 tablet (25 mg total) by mouth every 4 (four) hours as needed for nausea or vomiting. Qty: 120 tablet, Refills: 2    rifaximin (XIFAXAN) 550 MG TABS tablet Take 1 tablet (550 mg total) by mouth 2 (two) times daily. Qty: 60 tablet, Refills: 3    OXYGEN Inhale 2 L into the lungs continuous.    RELION  INSULIN SYR .3CC/29G 29G X 1/2" 0.3 ML MISC    Associated Diagnoses: Iron deficiency anemia      STOP taking these medications     ciprofloxacin (CIPRO) 500 MG tablet      losartan (COZAAR) 50 MG tablet      metroNIDAZOLE (FLAGYL) 500 MG tablet      spironolactone (ALDACTONE) 25 MG tablet        Allergies  Allergen Reactions  . Acetaminophen Other (See Comments)    Cirrhosis of liver  . Morphine Other (See Comments)    REACTION: Lowers BP  . Morphine And Related Other (See Comments)    Blood pressure drops   . Other Other (See Comments)    AGENT:  Per pt, CANNOT TAKE ANY FORM OF BLOOD THINNER, due to cirrhosis of the liver  . Penicillins Anaphylaxis and Rash  . Trazodone And Nefazodone Other (See Comments)    Cardiac arrythmia - DO NOT USE  . Codeine Phosphate Other (See Comments)    REACTION: Stomach cramps  . Hydrocodone-Acetaminophen Other (See Comments)    REACTION: hallucinations  . Cephalexin Swelling and Rash  . Hydrocodone-Acetaminophen Other (See Comments)    Can't have because of Tylenol bc of cirrhosis   Follow-up Information    Follow up with Donaciano Eva, MD.   Specialty:  Obstetrics and Gynecology   Why:  her office will call you with appointment   Contact information:   [address] [place] [address] [phone]        The results of significant diagnostics from this hospitalization (including imaging, microbiology, ancillary and laboratory) are listed below for reference.    Significant Diagnostic Studies: Dg Chest 2 View  10/03/2014   CLINICAL DATA:  Increased short of breath  EXAM: CHEST  2 VIEW  COMPARISON:  Radiograph 05/19/2014  FINDINGS: LEFT-sided pacemaker overlies normal cardiac silhouette. No effusion, infiltrate, or pneumothorax. No acute osseous abnormality.  IMPRESSION: No acute cardiopulmonary process.   Electronically Signed   By: Suzy Bouchard M.D.   On: 10/03/2014 16:55   Ct Angio Chest Pe W/cm &/or Wo  Cm  10/03/2014   CLINICAL DATA:  Right lower quadrant pain. IBS on going and has worsened. Multiple abdominal surgeries. Right lower quadrant pain, fever. Pleuritic chest pain and shortness of breath. History of lung cancer.  EXAM: CT ANGIOGRAPHY CHEST  CT ABDOMEN AND PELVIS WITH CONTRAST  TECHNIQUE: Multidetector CT imaging of the chest was performed using the standard protocol during bolus administration of intravenous contrast. Multiplanar  CT image reconstructions and MIPs were obtained to evaluate the vascular anatomy. Multidetector CT imaging of the abdomen and pelvis was performed using the standard protocol during bolus administration of intravenous contrast.  CONTRAST:  80 cc OMNIPAQUE IOHEXOL 350 MG/ML SOLN  COMPARISON:  None.  FINDINGS: CT CHEST FINDINGS  Heart: Left-sided pacemaker leads to the right atrium and right ventricle. Heart size is normal. No significant coronary artery calcifications. No pericardial effusion.  Vascular structures: Pulmonary arteries are well opacified. There is no acute pulmonary embolus. There is atherosclerotic calcification of the thoracic aorta. No aneurysm.  Mediastinum/thyroid: Posterior to the right lobe of the thyroid there is a low-attenuation mass which measures 2.8 x 3.0 cm. Findings favor a thyroid cyst. Further evaluation with ultrasound is recommended however.  Lungs/Airways: Patient motion artifact degrades the lung windows. No focal consolidations or pleural effusions. Mild scarring in the left lung base.  Chest wall/osseous: Visualized osseous structures have a normal appearance.  CT ABDOMEN AND PELVIS FINDINGS  Upper abdomen: No focal abnormality identified within the liver, adrenal glands, or kidneys. The spleen is absent. Small splenules are identified in the left upper quadrant. Lung the dorsum of the pancreas there is a 1.4 cm low-attenuation lesion like a representing a cyst. Within the pancreatic tail there is a 1.1 cm cystic lesion. Status post  cholecystectomy.  Gastrointestinal tract: The stomach has a normal appearance. No evidence for bowel obstruction. Patient has a Hartmann's pouch. Left lower quadrant colostomy. There is a parastomal hernia which contains small bowel. No evidence for associated obstruction. The appendix is surgically absent.  Pelvis: The uterus is surgically absent. Within the pelvis there is a multiloculated low-attenuation structure which measures 5.3 x 9.7 cm. Collection has increased since previous exam, raising the question of ovarian or peritoneal process. The ovaries are not well seen. There is no free pelvic fluid.  Retroperitoneum: There is dense atherosclerotic calcification of the abdominal aorta. No aneurysm.  Abdominal wall: Small para midline fat containing hernias. Moderate left lower quadrant parastomal small bowel containing hernia.  Osseous structures: Unremarkable.  Review of the MIP images confirms the above findings.  IMPRESSION: 1. Technically adequate exam showing no pulmonary embolus. 2. 3.0 cm low-attenuation lesion posterior to the right thyroid lobe. Thyroid ultrasound is recommended. 3. Absent spleen.  Left upper quadrant splenules. 4. Pancreatic cystic lesions. 5. Hartmann's pouch and left colostomy. Parastomal hernia contains nondilated loops of small bowel. 6. Small anterior wall fat containing hernias. 7. Multiloculated low-attenuation lesion within the pelvis posterior to the bladder suspicious for developing ovarian or peritoneal mass. Less likely similar findings could be seen with abscess. Further evaluation with ultrasound is recommended. If ultrasound cannot be performed pelvic MRI with contrast is recommended.   Electronically Signed   By: Nolon Nations M.D.   On: 10/03/2014 18:43   US Transvaginal Non-ob  10/04/2014   CLINICAL DATA:  Pelvic mass seen on prior CT scan.  EXAM: TRANSABDOMINAL AND TRANSVAGINAL ULTRASOUND OF PELVIS  TECHNIQUE: Both transabdominal and transvaginal ultrasound  examinations of the pelvis were performed. Transabdominal technique was performed for global imaging of the pelvis including uterus, ovaries, adnexal regions, and pelvic cul-de-sac. It was necessary to proceed with endovaginal exam following the transabdominal exam to visualize the adnexal regions.  COMPARISON:  CT scan of October 03, 2014; ultrasound of April 15, 2014.  FINDINGS: Status post hysterectomy.  Right ovary  Not visualized.  Left ovary  Not visualized.  Other findings  Complex multi-septated mass is seen in  the right adnexal region which corresponds to abnormality seen on CT scan. It measures 6.1 x 5.4 x 9.8 cm. Several of the septa appear to be thickened. This does not appear to be significantly changed compared to prior exam.  IMPRESSION: Status post hysterectomy. Ovaries are not visualized. 9.8 x 6.1 x 5.4 cm complex multi-septated mass is noted in the right adnexal region which corresponds to abnormality seen on CT scan. 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 Gynecological consultation is recommended.   Electronically Signed   By: Marijo Conception, M.D.   On: 10/04/2014 16:03   US Pelvis Complete  10/04/2014   CLINICAL DATA:  Pelvic mass seen on prior CT scan.  EXAM: TRANSABDOMINAL AND TRANSVAGINAL ULTRASOUND OF PELVIS  TECHNIQUE: Both transabdominal and transvaginal ultrasound examinations of the pelvis were performed. Transabdominal technique was performed for global imaging of the pelvis including uterus, ovaries, adnexal regions, and pelvic cul-de-sac. It was necessary to proceed with endovaginal exam following the transabdominal exam to visualize the adnexal regions.  COMPARISON:  CT scan of October 03, 2014; ultrasound of April 15, 2014.  FINDINGS: Status post hysterectomy.  Right ovary  Not visualized.  Left ovary  Not visualized.  Other findings  Complex multi-septated mass is seen in the right adnexal  region which corresponds to abnormality seen on CT scan. It measures 6.1 x 5.4 x 9.8 cm. Several of the septa appear to be thickened. This does not appear to be significantly changed compared to prior exam.  IMPRESSION: Status post hysterectomy. Ovaries are not visualized. 9.8 x 6.1 x 5.4 cm complex multi-septated mass is noted in the right adnexal region which corresponds to abnormality seen on CT scan. 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 Gynecological consultation is recommended.   Electronically Signed   By: Marijo Conception, M.D.   On: 10/04/2014 16:03   Ct Abdomen Pelvis W Contrast  10/03/2014   CLINICAL DATA:  Right lower quadrant pain. IBS on going and has worsened. Multiple abdominal surgeries. Right lower quadrant pain, fever. Pleuritic chest pain and shortness of breath. History of lung cancer.  EXAM: CT ANGIOGRAPHY CHEST  CT ABDOMEN AND PELVIS WITH CONTRAST  TECHNIQUE: Multidetector CT imaging of the chest was performed using the standard protocol during bolus administration of intravenous contrast. Multiplanar CT image reconstructions and MIPs were obtained to evaluate the vascular anatomy. Multidetector CT imaging of the abdomen and pelvis was performed using the standard protocol during bolus administration of intravenous contrast.  CONTRAST:  80 cc OMNIPAQUE IOHEXOL 350 MG/ML SOLN  COMPARISON:  None.  FINDINGS: CT CHEST FINDINGS  Heart: Left-sided pacemaker leads to the right atrium and right ventricle. Heart size is normal. No significant coronary artery calcifications. No pericardial effusion.  Vascular structures: Pulmonary arteries are well opacified. There is no acute pulmonary embolus. There is atherosclerotic calcification of the thoracic aorta. No aneurysm.  Mediastinum/thyroid: Posterior to the right lobe of the thyroid there is a low-attenuation mass which measures 2.8 x 3.0 cm. Findings favor a thyroid  cyst. Further evaluation with ultrasound is recommended however.  Lungs/Airways: Patient motion artifact degrades the lung windows. No focal consolidations or pleural effusions. Mild scarring in the left lung base.  Chest wall/osseous: Visualized osseous structures have a normal appearance.  CT ABDOMEN AND PELVIS FINDINGS  Upper abdomen: No focal abnormality identified within the liver, adrenal glands,  or kidneys. The spleen is absent. Small splenules are identified in the left upper quadrant. Lung the dorsum of the pancreas there is a 1.4 cm low-attenuation lesion like a representing a cyst. Within the pancreatic tail there is a 1.1 cm cystic lesion. Status post cholecystectomy.  Gastrointestinal tract: The stomach has a normal appearance. No evidence for bowel obstruction. Patient has a Hartmann's pouch. Left lower quadrant colostomy. There is a parastomal hernia which contains small bowel. No evidence for associated obstruction. The appendix is surgically absent.  Pelvis: The uterus is surgically absent. Within the pelvis there is a multiloculated low-attenuation structure which measures 5.3 x 9.7 cm. Collection has increased since previous exam, raising the question of ovarian or peritoneal process. The ovaries are not well seen. There is no free pelvic fluid.  Retroperitoneum: There is dense atherosclerotic calcification of the abdominal aorta. No aneurysm.  Abdominal wall: Small para midline fat containing hernias. Moderate left lower quadrant parastomal small bowel containing hernia.  Osseous structures: Unremarkable.  Review of the MIP images confirms the above findings.  IMPRESSION: 1. Technically adequate exam showing no pulmonary embolus. 2. 3.0 cm low-attenuation lesion posterior to the right thyroid lobe. Thyroid ultrasound is recommended. 3. Absent spleen.  Left upper quadrant splenules. 4. Pancreatic cystic lesions. 5. Hartmann's pouch and left colostomy. Parastomal hernia contains nondilated loops of  small bowel. 6. Small anterior wall fat containing hernias. 7. Multiloculated low-attenuation lesion within the pelvis posterior to the bladder suspicious for developing ovarian or peritoneal mass. Less likely similar findings could be seen with abscess. Further evaluation with ultrasound is recommended. If ultrasound cannot be performed pelvic MRI with contrast is recommended.   Electronically Signed   By: Nolon Nations M.D.   On: 10/03/2014 18:43   US Renal  10/05/2014   CLINICAL DATA:  Acute kidney injury.  EXAM: RENAL / URINARY TRACT ULTRASOUND COMPLETE  COMPARISON:  None.  FINDINGS: Right Kidney:  Length: 10.8 cm. Echogenicity within normal limits. No mass or hydronephrosis visualized.  Left Kidney:  Length: 10.4 cm. Echogenicity within normal limits. No mass or hydronephrosis visualized.  Bladder:  Appears normal for degree of bladder distention.  IMPRESSION: Negative for hydronephrosis.  Negative exam.   Electronically Signed   By: Inge Rise M.D.   On: 10/05/2014 19:46    Microbiology: Recent Results (from the past 240 hour(s))  MRSA PCR Screening     Status: None   Collection Time: 10/03/14 10:48 PM  Result Value Ref Range Status   MRSA by PCR NEGATIVE NEGATIVE Final    Comment:        The GeneXpert MRSA Assay (FDA approved for NASAL specimens only), is one component of a comprehensive MRSA colonization surveillance program. It is not intended to diagnose MRSA infection nor to guide or monitor treatment for MRSA infections.      Labs: Basic Metabolic Panel:  Recent Labs Lab 10/05/14 0116 10/06/14 0600 10/07/14 0515 10/08/14 1325 10/09/14 0333  NA 134* 130* 126* 123* 127*  K 5.2* 4.9 5.2* 5.3* 4.9  CL 100* 99* 93* 90* 94*  CO2 '24 26 25 22 24  '$ GLUCOSE 205* 153* 155* 145* 135*  BUN 47* 35* 33* 36* 40*  CREATININE 1.46* 1.19* 1.17* 1.12* 1.22*  CALCIUM 11.2* 10.8* 10.7* 10.6* 11.1*   Liver Function Tests:  Recent Labs Lab 10/03/14 1608  AST 39  ALT 39   ALKPHOS 60  BILITOT 0.3  PROT 6.0*  ALBUMIN 3.1*    Recent Labs Lab 10/03/14  1608  LIPASE 60*    Recent Labs Lab 10/03/14 1602  AMMONIA 79*   CBC:  Recent Labs Lab 10/03/14 1608 10/05/14 0116 10/06/14 0600 10/07/14 0515 10/08/14 1325  WBC 17.7* 14.7* 12.1* 19.7* 16.6*  NEUTROABS  --   --   --   --  10.9*  HGB 9.5* 9.9* 9.4* 9.5* 9.9*  HCT 30.3* 31.1* 28.7* 29.4* 30.6*  MCV 88.3 87.6 86.4 88.0 87.7  PLT 419* 378 369 337 334   Cardiac Enzymes:  Recent Labs Lab 10/04/14 0136 10/04/14 0741 10/04/14 1621  TROPONINI 0.06* 0.05* 0.04*   BNP: BNP (last 3 results)  Recent Labs  04/12/14 0710 05/15/14 2322 05/21/14 0427  BNP 311.9* 918.6* 120.7*    ProBNP (last 3 results)  Recent Labs  03/30/14 1230  PROBNP 71.0    CBG:  Recent Labs Lab 10/08/14 0718 10/08/14 1058 10/08/14 1656 10/08/14 2126 10/09/14 0718  GLUCAP 158* 158* 152* 195* 127*       Signed:  Giovana Faciane L  [facility] 10/09/2014, 8:33 AM

## 2014-10-10 ENCOUNTER — Telehealth: Payer: Self-pay | Admitting: *Deleted

## 2014-10-10 ENCOUNTER — Ambulatory Visit (INDEPENDENT_AMBULATORY_CARE_PROVIDER_SITE_OTHER): Payer: Medicare Other | Admitting: *Deleted

## 2014-10-10 DIAGNOSIS — R001 Bradycardia, unspecified: Secondary | ICD-10-CM | POA: Diagnosis not present

## 2014-10-10 LAB — GLUCOSE, CAPILLARY: GLUCOSE-CAPILLARY: 157 mg/dL — AB (ref 65–99)

## 2014-10-10 NOTE — Telephone Encounter (Signed)
Called pt with appt date/time 10/17/14, unable to reach. LMOVM for pt to call back to confirm date/time.

## 2014-10-10 NOTE — Progress Notes (Signed)
Remote pacemaker transmission.   

## 2014-10-11 ENCOUNTER — Other Ambulatory Visit: Payer: Self-pay

## 2014-10-11 LAB — GI PATHOGEN PANEL BY PCR, STOOL
C difficile toxin A/B: NOT DETECTED
CAMPYLOBACTER BY PCR: NOT DETECTED
Cryptosporidium by PCR: NOT DETECTED
E COLI 0157 BY PCR: NOT DETECTED
E coli (ETEC) LT/ST: NOT DETECTED
E coli (STEC): NOT DETECTED
G lamblia by PCR: NOT DETECTED
Norovirus GI/GII: NOT DETECTED
ROTAVIRUS A BY PCR: NOT DETECTED
SALMONELLA BY PCR: NOT DETECTED
SHIGELLA BY PCR: NOT DETECTED

## 2014-10-11 NOTE — Patient Outreach (Signed)
Transition of Care Call: Placed call to patient who reports that she is having pain. Reports that she has follow up with primary care MD on 9/15 and also reports that she will see Dr. Denman George on 10/17/2014.  Reports no active bleeding. Reports that she is sleeping on 4 pillows at night.  Reports that she increases her oxygen to 3 liters at night.  Reports MD is aware. States that she has a home health nurse with her at this time and it is not a good time to talk.   I suggested that I would call back tomorrow and patient agreed.  No care plan completed, medications not reviewed at this time due to lack of ability to discuss with patient.  Tomasa Rand, RN, BSN, CEN [facility] [employer] [phone]

## 2014-10-12 ENCOUNTER — Other Ambulatory Visit: Payer: Self-pay

## 2014-10-12 ENCOUNTER — Telehealth: Payer: Self-pay | Admitting: *Deleted

## 2014-10-12 ENCOUNTER — Ambulatory Visit: Payer: Medicare Other | Admitting: Family Medicine

## 2014-10-12 NOTE — Patient Outreach (Signed)
Follow up call to complete transition of care:  Placed call to patient. Reports that she has all her medications and is taking as RX.  States that she had a low blood sugar last night 60's.   Reports that she ate some candy and CBG came up. I encouraged patient to eat a snack before bedtime especially something with protein.  ( provided examples like a half a peanut butter sandwich)  Reviewed with patient the [facility] program and she would be receiving weekly transition of care calls. Informed patient to expect a call from [place] with [facility] next week. Provided 24 hour nurse advice line number for patients. Patient denies any questions or new concerns today.  Plan: Continue transition of care calls weekly. Will send MD letter. Tomasa Rand, RN, BSN, CEN [facility] [employer] [phone]

## 2014-10-12 NOTE — Telephone Encounter (Signed)
Transition Care Management Follow-up Telephone Call  How have you been since you were released from the hospital? Not feeling well   Do you understand why you were in the hospital? yes   Do you understand the discharge instrcutions? yes  Items Reviewed:  Medications reviewed: yes  Allergies reviewed: yes  Dietary changes reviewed: yes  Referrals reviewed: yes   Functional Questionnaire:   Activities of Daily Living (ADLs):   She states they are independent in the following: ambulation, bathing and hygiene, feeding, continence, grooming, toileting and dressing States they require assistance with the following: none   Any transportation issues/concerns?: yes   Any patient concerns? no   Confirmed importance and date/time of follow-up visits scheduled: yes   Confirmed with patient if condition begins to worsen call PCP or go to the ER.  Patient was given the Call-a-Nurse line [phone]: yes Patient was discharged 10/09/14 Patient was discharged to home Patient has an appointment 10/10/14 with Dr Sarajane Jews

## 2014-10-13 ENCOUNTER — Encounter: Payer: Self-pay | Admitting: Family Medicine

## 2014-10-13 ENCOUNTER — Ambulatory Visit (INDEPENDENT_AMBULATORY_CARE_PROVIDER_SITE_OTHER): Payer: Medicare Other | Admitting: Family Medicine

## 2014-10-13 VITALS — BP 170/57 | HR 64 | Temp 98.8°F | Ht 63.0 in | Wt 205.0 lb

## 2014-10-13 DIAGNOSIS — E0590 Thyrotoxicosis, unspecified without thyrotoxic crisis or storm: Secondary | ICD-10-CM | POA: Diagnosis not present

## 2014-10-13 DIAGNOSIS — I10 Essential (primary) hypertension: Secondary | ICD-10-CM

## 2014-10-13 DIAGNOSIS — I5033 Acute on chronic diastolic (congestive) heart failure: Secondary | ICD-10-CM

## 2014-10-13 DIAGNOSIS — E041 Nontoxic single thyroid nodule: Secondary | ICD-10-CM

## 2014-10-13 DIAGNOSIS — D509 Iron deficiency anemia, unspecified: Secondary | ICD-10-CM

## 2014-10-13 DIAGNOSIS — R1084 Generalized abdominal pain: Secondary | ICD-10-CM | POA: Diagnosis not present

## 2014-10-13 DIAGNOSIS — J439 Emphysema, unspecified: Secondary | ICD-10-CM

## 2014-10-13 DIAGNOSIS — Z23 Encounter for immunization: Secondary | ICD-10-CM

## 2014-10-13 DIAGNOSIS — K7469 Other cirrhosis of liver: Secondary | ICD-10-CM

## 2014-10-13 DIAGNOSIS — D500 Iron deficiency anemia secondary to blood loss (chronic): Secondary | ICD-10-CM

## 2014-10-13 LAB — BASIC METABOLIC PANEL
BUN: 55 mg/dL — AB (ref 6–23)
CHLORIDE: 104 meq/L (ref 96–112)
CO2: 28 meq/L (ref 19–32)
Calcium: 12.1 mg/dL — ABNORMAL HIGH (ref 8.4–10.5)
Creatinine, Ser: 1.05 mg/dL (ref 0.40–1.20)
GFR: 56.31 mL/min — AB (ref >60.00)
GLUCOSE: 165 mg/dL — AB (ref 70–99)
POTASSIUM: 4.7 meq/L (ref 3.5–5.1)
SODIUM: 138 meq/L (ref 135–145)

## 2014-10-13 LAB — TSH: TSH: 0.11 u[IU]/mL — ABNORMAL LOW (ref 0.35–4.50)

## 2014-10-13 LAB — T4, FREE: Free T4: 1.07 ng/dL (ref 0.60–1.60)

## 2014-10-13 LAB — THYROID STIMULATING IMMUNOGLOBULIN: Thyroid Stimulating Immunoglob: 218 % — ABNORMAL HIGH (ref 0–139)

## 2014-10-13 LAB — T3, FREE: T3 FREE: 3.2 pg/mL (ref 2.3–4.2)

## 2014-10-13 MED ORDER — ATORVASTATIN CALCIUM 20 MG PO TABS: 20.0000 mg | ORAL_TABLET | Freq: Every morning | ORAL | Status: AC

## 2014-10-13 MED ORDER — DIAZEPAM 5 MG PO TABS: 5.0000 mg | ORAL_TABLET | Freq: Four times a day (QID) | ORAL | Status: AC | PRN

## 2014-10-13 MED ORDER — FOLIC ACID 1 MG PO TABS: 1.0000 mg | ORAL_TABLET | Freq: Every day | ORAL | Status: AC

## 2014-10-13 MED ORDER — PROMETHAZINE HCL 25 MG PO TABS: 25.0000 mg | ORAL_TABLET | ORAL | Status: AC | PRN

## 2014-10-13 MED ORDER — DICYCLOMINE HCL 10 MG PO CAPS: 10.0000 mg | ORAL_CAPSULE | Freq: Three times a day (TID) | ORAL | Status: AC | PRN

## 2014-10-13 MED ORDER — FENTANYL 25 MCG/HR TD PT72: 25.0000 ug | MEDICATED_PATCH | TRANSDERMAL | Status: AC

## 2014-10-13 NOTE — Progress Notes (Signed)
Pre visit review using our clinic review tool, if applicable. No additional management support is needed unless otherwise documented below in the visit note. 

## 2014-10-13 NOTE — Telephone Encounter (Signed)
noted 

## 2014-10-13 NOTE — Progress Notes (Signed)
   Subjective:    Patient ID: [name], female    DOB: Nov 15, 1950, 64 y.o.   MRN: [id number]  HPI Here to follow up a hospital stay from 10-03-14 to 10-08-13 for severe lower abdominal pain. Her workup revealed this to be due to the large right adnexal mass we found a few months ago. This is felt to be most likely benign because of her normal CA 125, but we do not know for sure. She is not a candidate for surgery or even a biopsy. She was given a Duragesic patch to use in addition to her oxycodone, and this has been helpful. She is set to see Dr. Everitt Berry on 10-17-14 to follow up on this. Otherwise her other disease states appear to be stable. Her creatinine at DC was stable at 1.22, her ammonia level was stable at 79. Her liver enzymes were normal. Her Hgb was stable at 9.9. Her COPD was at her baseline. One new issue that was discovered was hyperthyroidism. Her TSH was low at 0.012 and her free T4 was high at 1.21. Her free Ts was normal. On a chest CT scan a nodule in the right thyroid lobe was found. That was felt to be cystic but an [place] was recommended for a better look.    Review of Systems  Constitutional: Positive for fatigue. Negative for fever.  Respiratory: Positive for shortness of breath and wheezing. Negative for cough.   Cardiovascular: Positive for leg swelling. Negative for chest pain and palpitations.  Gastrointestinal: Positive for abdominal pain, diarrhea, blood in stool and abdominal distention. Negative for nausea, vomiting, constipation and rectal pain.  Endocrine: Negative.   Neurological: Negative.        Objective:   Physical Exam  Constitutional: She is oriented to person, place, and time.  Alert but frail, using [name] oxygen, walks slowly with her walker   Neck: Neck supple. No thyromegaly present.  No thyroid nodules are felt   Cardiovascular: Normal rate, regular rhythm, normal heart sounds and intact distal pulses.   Pulmonary/Chest: Effort normal. No  respiratory distress. She has no rales.  Soft scattered wheezes   Abdominal: Soft. Bowel sounds are normal. She exhibits no distension and no mass. There is no rebound and no guarding.  Diffusely tender   Lymphadenopathy:    She has no cervical adenopathy.  Neurological: She is alert and oriented to person, place, and time.          Assessment & Plan:  Her COPD is stable. Her cirrhosis is stable. Her stage 3 CKD is stable. Check a BMET today. She has chronic abdominal pain, most likely from an enlarging adnexal mass. She will see Dr. Denman Berry next week. We wrote for her to stay on Duragesic 25 mcg/hr  patches in addition to oxycodone. She appears to have a new diagnosis of hyperthyroidism with a thyroid nodule. We will recheck a thyroid panel and set her up for a thyroid US soon. She will set up a mammogram soon (her last one was in 2011).

## 2014-10-14 ENCOUNTER — Telehealth: Payer: Self-pay | Admitting: Family Medicine

## 2014-10-14 NOTE — Telephone Encounter (Signed)
[name] is calling requesting two follow up visits  for social worker and community resources . Ok to leave verbal on her cell phone

## 2014-10-14 NOTE — Telephone Encounter (Signed)
I spoke with [name] and gave the verbal order.

## 2014-10-14 NOTE — Telephone Encounter (Signed)
Per [name], okay to give verbal order.

## 2014-10-14 NOTE — Telephone Encounter (Signed)
[name] w/[facility] needs a new order for a manual wheelchair. The first wheelchair order was too soon, now insurance will pay but need new order.  And a copy of lastest face to face w/ the dr.  Joylene Igo  [phone]

## 2014-10-17 ENCOUNTER — Encounter: Payer: Self-pay | Admitting: Gynecologic Oncology

## 2014-10-17 ENCOUNTER — Ambulatory Visit: Payer: Medicare Other | Attending: Gynecologic Oncology | Admitting: Gynecologic Oncology

## 2014-10-17 DIAGNOSIS — N949 Unspecified condition associated with female genital organs and menstrual cycle: Secondary | ICD-10-CM

## 2014-10-17 DIAGNOSIS — R1084 Generalized abdominal pain: Secondary | ICD-10-CM | POA: Insufficient documentation

## 2014-10-17 DIAGNOSIS — IMO0002 Reserved for concepts with insufficient information to code with codable children: Secondary | ICD-10-CM | POA: Insufficient documentation

## 2014-10-17 NOTE — Patient Instructions (Signed)
No follow up necessary.  Please call for any questions or concerns.

## 2014-10-17 NOTE — Progress Notes (Signed)
Followup Note: Gyn-Onc  Consult was originally requested by Dr. Sabra Heck and Dr Sarajane Jews for the evaluation of [name] 64 y.o. female with a complex right (likely) ovarian mass.  CC:  Chief Complaint  Patient presents with  . Cyst right ovary    Assessment/Plan:  Ms. [name]  is a 64 y.o.  year old patient of Dr Sarajane Jews with multiple complex medical comorbidities, poor performance status and a right ovarian cystic mass.  I performed a history, physical examination, and personally reviewed the patient's scan images including the CT scans from 10/04/14 and the [place] from 10/05/14.  I have a very low suspicion that this ovarian mass is malignant. This cyst appears benign on ultrasound, there has been a very subtle increase in growth over the course of one year, CA-125 value is normal which is reassuring and I also do not believe that this cystic mass is causing the patient's they upper abdominal severe pains which I believe are secondary to her parastomal hernia.  Due to her severe medical comorbidities, this patient is not a surgical candidate. The only indication for surgery would be pain, and in order to attempt (possibly unsuccessfully) to resolve her pain she would require I very complex stomal revision and hernia repair in addition to the cystectomy, none of which is appropriate in this patient with end stage renal disease, O2 dependent respiratory failure and cirrhosis and limited life expectancy.  I agree with Dr Barbie Banner plan for ongoing treatment with palliation of her pain.  I do not need to see the patient back for repeated surveillance.  I agree with instituting hospice care as the patient is not a candidate for aggressive medical intervention for any of her ailments, and she does not benefit from emergency room and hospital admissions.    HPI: [name] is a 64 year old woman who is seen in consultation at the request of Dr. Sabra Heck and Dr Sarajane Jews for a 6-7 cm complex right ovarian  cyst.  The patient has had multiple CT scans performed in the past 12 months largely due to follow-up after a splenectomy and chronic upper abdominal and lower pelvic pain. On one of the reports from the CT scan in every 2016 comment was made regarding a 3.3 cm right adnexal cyst that had been previously 2.1 cm. They also noted an adjacent 3 cm tubular cystic structure in the midline. Of note the patient has a history of a prior hysterectomy for cervical cancer in her 70s.  To follow up the findings from the CT scan transvaginal ultrasounds performed on for every 24 2016 this confirmed the right adnexal cystic mass and described it as a 5.0 x 4.4 x 7.4 cm cystic mass with multiple irregular septations and thickening. The left ovary was not identified. There was no free pelvic fluid. The uterus was surgically absent. A CA-125 was drawn on 03/28/2014 and was normal at 29 units per milliliter.  When I review the images back in every 2015 this cyst is visible on that scan, albeit slightly smaller in size.  Ms. Castleman has multiple medical comorbidities. She has class III morbid obesity (BMI 41kg/m2). She has nonalcoholic cirrhosis stage IV. She also has respiratory failure and his oxygen dependent secondary to COPD and emphysema. She is a former smoker. She has a prior history of lung cancer treated surgically. 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 In 2007 she suffered a perforation of her colon during  colonoscopy and required an emergent colostomy formation. She subsequently developed a parastomal hernia. Her colostomy has not been reversed due to her underlying poor general medical health and she is felt to not be a good surgical candidate. She also has a history of an irregular heartbeat, CHF and placement of a pacemaker. She has no family history for ovarian or breast cancer.   Interval History: She was admitted to [facility] on 10/04/14 for  abdominal pains. Repeated CT and US imaging at that time demonstrated a subtle increase (possible) in the right adnexal septated mass measuring 6.1 x 5.4x9.8cm ("this does not appear to be significantly changed compared to prior exam"). She was discharged to home with a plan for palliation of her pain symptoms.  Since then she has followed up with Dr Sarajane Jews, her PCP who has planned on instituting hospice care for her and has started her on a Duragesic patch.    Current Meds:  Outpatient Encounter Prescriptions as of 10/17/2014  Medication Sig  . albuterol (PROVENTIL) (2.5 MG/3ML) 0.083% nebulizer solution Take 3 mLs (2.5 mg total) by nebulization every 4 (four) hours as needed for wheezing or shortness of breath.  [name] albuterol-ipratropium (COMBIVENT) 18-103 MCG/ACT inhaler Inhale 2 puffs into the lungs every 4 (four) hours as needed for wheezing or shortness of breath.   [name] atorvastatin (LIPITOR) 20 MG tablet Take 1 tablet (20 mg total) by mouth every morning.  . diazepam (VALIUM) 5 MG tablet Take 1 tablet (5 mg total) by mouth every 6 (six) hours as needed for anxiety.  . dicyclomine (BENTYL) 10 MG capsule Take 1 capsule (10 mg total) by mouth 3 (three) times daily as needed for spasms.  [name] esomeprazole (NEXIUM) 40 MG capsule Take 40 mg by mouth at bedtime.   . fentaNYL (DURAGESIC - DOSED MCG/HR) 25 MCG/HR patch Place 1 patch (25 mcg total) onto the skin every 3 (three) days.  . Fluticasone-Salmeterol (ADVAIR) 250-50 MCG/DOSE AEPB Inhale 1 puff into the lungs every 12 (twelve) hours.  . folic acid (FOLVITE) 1 MG tablet Take 1 tablet (1 mg total) by mouth daily.  . furosemide (LASIX) 40 MG tablet Take 3 tablets (120 mg total) by mouth 2 (two) times daily.  [name] HUMULIN R 500 UNIT/ML injection Inject 33 units before breakfast, 33 units before lunch and 30units before dinner daily as instructed. (Patient taking differently: Inject 28-35 Units into the skin 3 (three) times daily with meals. Inject 35 units  before breakfast, 28 units before lunch and 35 units before dinner daily as instructed.)  . hydrALAZINE (APRESOLINE) 25 MG tablet Take 1 tablet (25 mg total) by mouth 2 (two) times daily.  . isosorbide dinitrate (ISORDIL) 20 MG tablet Take 1 tablet (20 mg total) by mouth 2 (two) times daily.  [name] lactulose (CHRONULAC) 10 GM/15ML solution Take 15 mLs (10 g total) by mouth 3 (three) times daily. (Patient taking differently: Take 10 g by mouth 4 (four) times daily. )  . losartan (COZAAR) 50 MG tablet   . metoCLOPramide (REGLAN) 10 MG tablet Take 1 tablet (10 mg total) by mouth 3 (three) times daily.  . metroNIDAZOLE (FLAGYL) 500 MG tablet   . Oxycodone HCl 20 MG TABS Take 1 tablet (20 mg total) by mouth every 3 (three) hours as needed (pain).  . OXYGEN Inhale 2 L into the lungs continuous.  . polyethylene glycol powder (GLYCOLAX/MIRALAX) powder Take 17 g by mouth 2 (two) times daily as needed. (Patient taking differently: Take 17 g by  mouth 2 (two) times daily as needed for mild constipation. )  . potassium chloride (KLOR-CON 10) 10 MEQ tablet Take 1 tablet (10 mEq total) by mouth daily. (Patient not taking: Reported on 10/12/2014)  . promethazine (PHENERGAN) 25 MG tablet Take 1 tablet (25 mg total) by mouth every 4 (four) hours as needed for nausea or vomiting.  [name] RELION INSULIN SYR .3CC/29G 29G X 1/2" 0.3 ML MISC   . rifaximin (XIFAXAN) 550 MG TABS tablet Take 1 tablet (550 mg total) by mouth 2 (two) times daily.  [name] spironolactone (ALDACTONE) 25 MG tablet    No facility-administered encounter medications on file as of 10/17/2014.    Allergy:  Allergies  Allergen Reactions  . Acetaminophen Other (See Comments)    Cirrhosis of liver  . Morphine Other (See Comments)    REACTION: Lowers BP  . Morphine And Related Other (See Comments)    Blood pressure drops   . Other Other (See Comments)    AGENT:  Per pt, CANNOT TAKE ANY FORM OF BLOOD THINNER, due to cirrhosis of the liver  . Penicillins  Anaphylaxis and Rash  . Trazodone And Nefazodone Other (See Comments)    Cardiac arrythmia - DO NOT USE  . Codeine Phosphate Other (See Comments)    REACTION: Stomach cramps  . Hydrocodone-Acetaminophen Other (See Comments)    REACTION: hallucinations  . Cephalexin Swelling and Rash  . Hydrocodone-Acetaminophen Other (See Comments)    Can't have because of Tylenol bc of cirrhosis    Social Hx:   Social History   Social History  . Marital Status: Divorced    Spouse Name: N/A  . Number of Children: N/A  . Years of Education: N/A   Occupational History  . Disabled    Social History Main Topics  . Smoking status: Former Smoker -- 2.00 packs/day for 35 years    Types: Cigarettes    Quit date: 03/24/2002  . Smokeless tobacco: Never Used  . Alcohol Use: No  . Drug Use: No  . Sexual Activity: No   Other Topics Concern  . Not on file   Social History Narrative   Regular exercise: a little   Caffeine use: 2 cups of coffee in am          Past Surgical Hx:  Past Surgical History  Procedure Laterality Date  . Colostomy  11/06/2005  . Lung removal, partial Left 2004    upper lobe removed  . Left colectomy  11/06/2005    Hartmann resection of sigmoid colon and end colostomy.  . Esophagogastroduodenoscopy  03/19/2011    Procedure: ESOPHAGOGASTRODUODENOSCOPY (EGD);  Surgeon: [name], MD;  Location: [facility];  Service: Endoscopy;  Laterality: N/A;  . Colonoscopy  03/19/2011    Procedure: COLONOSCOPY;  Surgeon: [name], MD;  Location: [facility] ENDOSCOPY;  Service: Endoscopy;  Laterality: N/A;  . Givens capsule study  03/20/2011    Procedure: GIVENS CAPSULE STUDY;  Surgeon: [name], MD;  Location: [facility] ENDOSCOPY;  Service: Endoscopy;  Laterality: N/A;  . Small bowel obstruction repair  March 2012  . Umbilical hernia repair  March 2012  . Splenectomy, total N/A 02/24/2013    Procedure: SPLENECTOMY;  Surgeon: Harl Bowie, MD;  Location: [place];  Service:  General;  Laterality: N/A;  . Orif patella Left 04/21/2013    Procedure: OPEN REDUCTION INTERNAL (ORIF) FIXATION PATELLA;  Surgeon: Augustin Schooling, MD;  Location: [place];  Service: Orthopedics;  Laterality: Left;  . Pacemaker  insertion  2015  . Esophagogastroduodenoscopy (egd) with propofol N/A 11/02/2013    Procedure: ESOPHAGOGASTRODUODENOSCOPY (EGD) WITH PROPOFOL;  Surgeon: [name], MD;  Location: [facility] ENDOS[facility];  Service: Endoscopy;  Laterality: N/A;  EGD with APC  . Hot hemostasis N/A 11/02/2013    Procedure: HOT HEMOSTASIS (ARGON PLASMA COAGULATION/BICAP);  Surgeon: [name], MD;  Location: [facility];  Service: Endoscopy;  Laterality: N/A;  . Colon surgery    . Hernia repair    . Fracture surgery    . Abdominal hysterectomy    . Tubal ligation    . Tonsillectomy  1959  . Appendectomy  1962  . Cholecystectomy    . Cardiac catheterization  1967  . Permanent pacemaker insertion N/A 09/07/2013    Procedure: PERMANENT PACEMAKER INSERTION;  Surgeon: Viktoriya Glaspy Lance, MD;  Location: [facility] CATH LAB;  Service: Cardiovascular;  Laterality: N/A;  . Tee without cardioversion N/A 01/06/2014    Procedure: TRANSESOPHAGEAL ECHOCARDIOGRAM (TEE);  Surgeon: Lelon Perla, MD;  Location: [facility] ENDOSCOPY;  Service: Cardiovascular;  Laterality: N/A;  . Colostomy  11/06/2005    OPERATIVE REPORT    Past Medical Hx:  Past Medical History  Diagnosis Date  . Cirrhosis   . GERD (gastroesophageal reflux disease)   . Cervical disc syndrome     trouble turnng neck at times  . Chronic respiratory failure   . Diverticulitis of colon   . Perforation of colon   . IBS (irritable bowel syndrome)   . Chronic lower GI bleeding   . Overactive bladder   . Thrombocytopenia     sees Dr. Julien Nordmann   . Splenomegaly   . Depression   . Hypertension   . Asthma   . Heart murmur   . Hyperlipidemia   . COPD (chronic obstructive pulmonary disease)     sees Dr. Gwenette Greet   . Colostomy care   . Hypercalcemia   .  CHF (congestive heart failure)   . Pacemaker 12/14/2013  . 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 Cervical cancer many years ago  . Complication of anesthesia     woke up during colonscopy and endoscopy in past  . History of blood transfusion "several"    "bleeding via ostomy" (01/04/2014)  . On home oxygen therapy     "2L; 24/7" (01/04/2014)  . Pneumonia "1-2 times"  . Chronic bronchitis "several times"  . Sleep apnea 2012    mild, no cpap needed  . Type II diabetes mellitus     sees Dr. Cruzita Lederer   . Chronic disease anemia     sees Dr. Julien Nordmann, due to chronic disease and GI losses   . History of stomach ulcers   . Migraine     "last one was several years ago" (01/04/2014)  . Arthritis     "tailbone; hands; legs" (01/04/2014)  . Anxiety   . Pericardial effusion 2007, 2015.   [name] Chronic abdominal pain     IBS  . Cirrhosis of liver     stage 4  . 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  Patient has mild copd.        Past Gynecological History:  Cervical cancer s/p hysterectomy 40 years ago.  No LMP recorded. Patient has had a hysterectomy.  Family Hx:  Family History  Problem Relation Age of Onset  .  Coronary artery disease    . Diabetes type II    . Heart attack Mother   . Diabetes type II Mother   . Cirrhosis Father   . Heart attack Sister   . Pancreatic cancer Mother     secondary from surgery  . Cervical cancer Maternal Grandmother   . Hypertension Father   . Hypertension Sister   . Hypertension Son   . Hypertension Daughter   . Stroke Neg Hx     Review of Systems:  Constitutional  Feels fatigued, somnolent  ENT Normal appearing ears and nares bilaterally Skin/Breast  No rash, sores, jaundice, itching, dryness Cardiovascular  No chest pain, + shortness of breath, +edema  Pulmonary  + cough + wheeze.  Gastro  Intestinal  No nausea, vomitting, or diarrhoea. No bright red blood per rectum, + abdominal pain, no change in bowel movement, or constipation.  Genito Urinary  No frequency, urgency, dysuria, no vaginal bleeding Musculo Skeletal  + myalgia, arthralgia, joint swelling and pain  Neurologic  No weakness, numbness, change in gait,  Psychology  No depression, anxiety, insomnia.   Vitals:  There were no vitals taken for this visit.  Physical Exam: weiight 199lb, BP 110/35, HR 83, RR 20, temp 98.7, PAO2 100% on O2.  In no distress, on O2.  No gyn exam performed  Donaciano Eva, MD   10/17/2014, 12:27 PM

## 2014-10-19 ENCOUNTER — Telehealth: Payer: Self-pay | Admitting: Family Medicine

## 2014-10-19 LAB — CUP PACEART REMOTE DEVICE CHECK
Brady Statistic AP VP Percent: 1.7 %
Brady Statistic AP VS Percent: 83 %
Brady Statistic AS VP Percent: 1 %
Brady Statistic RA Percent Paced: 82 %
Brady Statistic RV Percent Paced: 1.7 %
Date Time Interrogation Session: 20160911165805
Lead Channel Impedance Value: 540 Ohm
Lead Channel Pacing Threshold Amplitude: 0.75 V
Lead Channel Pacing Threshold Pulse Width: 0.4 ms
Lead Channel Sensing Intrinsic Amplitude: 11.1 mV
Lead Channel Setting Pacing Amplitude: 1.375
Lead Channel Setting Pacing Pulse Width: 0.4 ms
Lead Channel Setting Sensing Sensitivity: 2 mV
MDC IDC MSMT BATTERY REMAINING LONGEVITY: 117 mo
MDC IDC MSMT BATTERY REMAINING PERCENTAGE: 95.5 %
MDC IDC MSMT BATTERY VOLTAGE: 3.02 V
MDC IDC MSMT LEADCHNL RA PACING THRESHOLD AMPLITUDE: 0.375 V
MDC IDC MSMT LEADCHNL RA SENSING INTR AMPL: 5 mV
MDC IDC MSMT LEADCHNL RV IMPEDANCE VALUE: 410 Ohm
MDC IDC MSMT LEADCHNL RV PACING THRESHOLD PULSEWIDTH: 0.4 ms
MDC IDC PG SERIAL: 7648211
MDC IDC SET LEADCHNL RV PACING AMPLITUDE: 2.5 V
MDC IDC STAT BRADY AS VS PERCENT: 13 %
Pulse Gen Model: 2240

## 2014-10-19 NOTE — Telephone Encounter (Signed)
These are ready to fax

## 2014-10-19 NOTE — Telephone Encounter (Signed)
Mickel Baas physical therapist is requesting physical therapy for patient    one time this week and then twice a wk for 4 wks. Please rewritten rx for manual wheelchair fax to [phone]

## 2014-10-19 NOTE — Telephone Encounter (Signed)
Script was faxed.

## 2014-10-20 ENCOUNTER — Other Ambulatory Visit: Payer: Self-pay | Admitting: *Deleted

## 2014-10-20 NOTE — Patient Outreach (Addendum)
[facility]) Care Management  [place]  10/20/2014   [name] 03-Aug-1950 [id number]  Transition of care call Explained to patient that I am covering for Tomasa Rand, CM that spoke with her on last week, patient then remembered that I would be calling to check on her this week Subjective: "I am doing all right" states no problems with taking her medication, and working with someone right now,declined return call back,  only a brief conversation on the telephone, patient reports that she was able to have her doctors appointments on this week and other test are scheduled. [name] denies other concerns on today.   Plan:  Continue with transition of care program call, explained to patient that [facility] would be calling her on next week.  Joylene Draft, RN, [place] Care Management [phone]

## 2014-10-20 NOTE — Telephone Encounter (Signed)
Okay per [name] to give verbal order for PT, the script was wrote and faxed yesterday 10/19/14. I left this information on Laura's phone.

## 2014-10-21 ENCOUNTER — Telehealth: Payer: Self-pay | Admitting: Internal Medicine

## 2014-10-21 ENCOUNTER — Other Ambulatory Visit: Payer: Self-pay | Admitting: *Deleted

## 2014-10-21 MED ORDER — HUMULIN R U-500 (CONCENTRATED) 500 UNIT/ML ~~LOC~~ SOLN: 28.0000 [IU] | Freq: Three times a day (TID) | SUBCUTANEOUS | Status: AC

## 2014-10-21 MED ORDER — HUMULIN R U-500 (CONCENTRATED) 500 UNIT/ML ~~LOC~~ SOLN: 28.0000 [IU] | Freq: Three times a day (TID) | SUBCUTANEOUS | Status: DC

## 2014-10-21 NOTE — Telephone Encounter (Signed)
Called pharmacy and corrected the insulin amount.

## 2014-10-21 NOTE — Telephone Encounter (Signed)
Called pharmacy and spoke with them about correct dosage so pt can get her insulin. Pharmacist advised to send 2 vials - 40 mL. Pharmacist to talk with her about the size of her insulin syringes as well.

## 2014-10-21 NOTE — Telephone Encounter (Signed)
Patient stated that her prescription was worded wrong and she can't get her insulin, please advise

## 2014-10-21 NOTE — Telephone Encounter (Signed)
Sent the wrong vial amount in.

## 2014-10-21 NOTE — Telephone Encounter (Signed)
Patient stated Dr Cruzita Lederer called her, she is totally out of insulin, Pharmacy stated what was prescribed wasn't enough, please advise

## 2014-10-24 ENCOUNTER — Ambulatory Visit
Admission: RE | Admit: 2014-10-24 | Discharge: 2014-10-24 | Disposition: A | Discharge status: Home / Self Care | Payer: Medicare Other | Source: Ambulatory Visit | Attending: Family Medicine | Admitting: Family Medicine

## 2014-10-24 DIAGNOSIS — E041 Nontoxic single thyroid nodule: Secondary | ICD-10-CM

## 2014-10-24 DIAGNOSIS — Z1231 Encounter for screening mammogram for malignant neoplasm of breast: Secondary | ICD-10-CM

## 2014-10-27 ENCOUNTER — Other Ambulatory Visit: Payer: Self-pay

## 2014-10-27 NOTE — Patient Outreach (Signed)
Transition of care call: Placed call to patient for transition of care. Patient reports that she can not talk and request I call back at another time.  PLAN: Will attempt to reach patient at a later time.  Tomasa Rand, RN, BSN, CEN [facility] [employer] [phone]

## 2014-10-28 ENCOUNTER — Other Ambulatory Visit: Payer: Self-pay

## 2014-10-28 DIAGNOSIS — Z0279 Encounter for issue of other medical certificate: Secondary | ICD-10-CM | POA: Diagnosis not present

## 2014-10-28 NOTE — Patient Outreach (Signed)
Transition of care call: Placed call to patient. Reviewed purpose of call. Patient reports that she could not talk earlier today because she and her family were meeting with hospice.  Patient reports that she is very interested in hospice care and to be kept comfortable.   Patient reports that she will find out on Monday October 3rd if she is accepted into program. Patient denies any problem today but states that she is at peace with the direction of her plan of care.  PLAN: discussed with patient that I would contact her next week and determine if she is in need of [facility] services in the future.   Tomasa Rand, RN, BSN, CEN [facility] [employer] [phone]

## 2014-11-01 ENCOUNTER — Other Ambulatory Visit: Payer: Self-pay

## 2014-11-01 NOTE — Patient Outreach (Signed)
Care coordination call / case closure: Placed follow up call to patient who reports that she was accepted into the hospice program. Patient is very satisified with the direction of her plan of care.  Reports that she has a hospital bed and a wheelchair and is going to be " kept comfortable"  I informed patient that I would be closing case as she now is in the care of hospice. Patient was in agreement.  Will notify MD.  Please note all assessments not completed due to patient not in transition of care program for 30 days. [facility] CM Care Plan Problem One        Most Recent Value   Care Plan Problem One  Recent admission related to GI issues   Role Documenting the Problem One  Care Management [place] for Problem One  Active   [facility] Long Term Goal (31-90 days)  Patient will be able to verbalize no readmissions in the next 31 days.   [facility] Long Term Goal Start Date  10/12/14   [facility] Long Term Goal Met Date  11/01/14 Barrie Folk not met. in care of hospice at this time.]   Interventions for Problem One Long Term Goal  provided 24 hour nurse advice line telephone number. Reviewed importance of calling MD with any concerns. Discussed importance of taking all meds as prescribed.   [facility] CM Short Term Goal #1 (0-30 days)  Patient will be able to verbalize following  up with two MD's  as planned in the next 7 days.   [facility] CM Short Term Goal #1 Start Date  10/12/14   [facility] CM Short Term Goal #1 Met Date  11/01/14 Barrie Folk met]   Interventions for Short Term Goal #1  reviewed importance of appointment. encourged patient to call her transportation option and confirm that she has transportation   [facility] CM Short Term Goal #2 (0-30 days)  Patient will be able to verbalize taking all meds as prescribed in the next 30 days   [facility] CM Short Term Goal #2 Start Date  10/12/14   [facility] CM Short Term Goal #2 Met Date  11/01/14 [goal not met, in hospice care]   Interventions for Short Term Goal #2  reviewed with patinet the importance  of taking all meds as RX. Encouraged patient to call MD or nurse for medications concerns.      Tomasa Rand, RN, BSN, CEN [facility] [employer] [phone]

## 2014-11-04 ENCOUNTER — Encounter: Payer: Self-pay | Admitting: Cardiology

## 2014-11-04 DIAGNOSIS — E1142 Type 2 diabetes mellitus with diabetic polyneuropathy: Secondary | ICD-10-CM | POA: Diagnosis not present

## 2014-11-04 NOTE — Patient Outreach (Signed)
[facility]) Care Management  11/04/2014  [name] 1950/08/08 [id number]   Notification from Tomasa Rand, RN to close case due to patient is now under Hospice care.  Thanks, [name]. [place], [place] Assistant Phone: [phone] Fax: [phone]

## 2014-11-10 ENCOUNTER — Ambulatory Visit: Payer: Medicare Other | Admitting: Internal Medicine

## 2014-11-10 DIAGNOSIS — Z0289 Encounter for other administrative examinations: Secondary | ICD-10-CM

## 2014-11-16 ENCOUNTER — Encounter: Payer: Self-pay | Admitting: Internal Medicine

## 2014-11-29 ENCOUNTER — Encounter: Payer: Self-pay | Admitting: Family Medicine

## 2014-11-29 DEATH — deceased

## 2014-12-08 ENCOUNTER — Ambulatory Visit: Payer: Medicare Other | Admitting: Internal Medicine

## 2014-12-08 ENCOUNTER — Other Ambulatory Visit: Payer: Medicare Other

## 2014-12-12 ENCOUNTER — Other Ambulatory Visit: Payer: Medicare Other

## 2014-12-12 ENCOUNTER — Ambulatory Visit: Payer: Medicare Other | Admitting: Internal Medicine

## 2015-02-10 ENCOUNTER — Ambulatory Visit: Payer: Medicare Other | Admitting: Pulmonary Disease

## 2015-05-26 ENCOUNTER — Other Ambulatory Visit: Payer: Self-pay | Admitting: Family Medicine

## 2015-07-17 ENCOUNTER — Other Ambulatory Visit: Payer: Self-pay | Admitting: Nurse Practitioner

## 2015-07-18 ENCOUNTER — Other Ambulatory Visit: Payer: Self-pay | Admitting: Nurse Practitioner

## 2016-02-05 IMAGING — US US SOFT TISSUE HEAD/NECK
1 series · 13 of 25 positions shown · non-contrast
Comparison: 11/26/2006

CLINICAL DATA: Evaluate right thyroid nodule.

EXAM:
THYROID ULTRASOUND
TECHNIQUE: Ultrasound examination of the thyroid gland and adjacent soft
tissues was performed.

[Series 1: us soft tissue head/neck · 0.09mm/px · 13 of 53 slices shown]
[im 1/53]
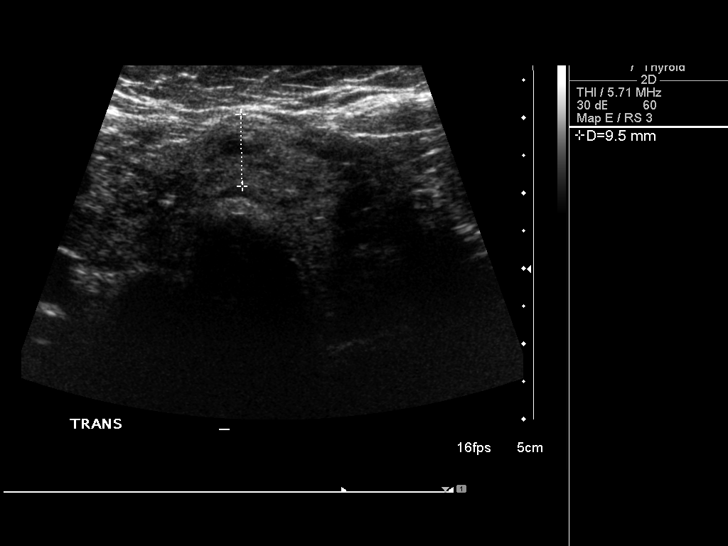
[im 5/53]
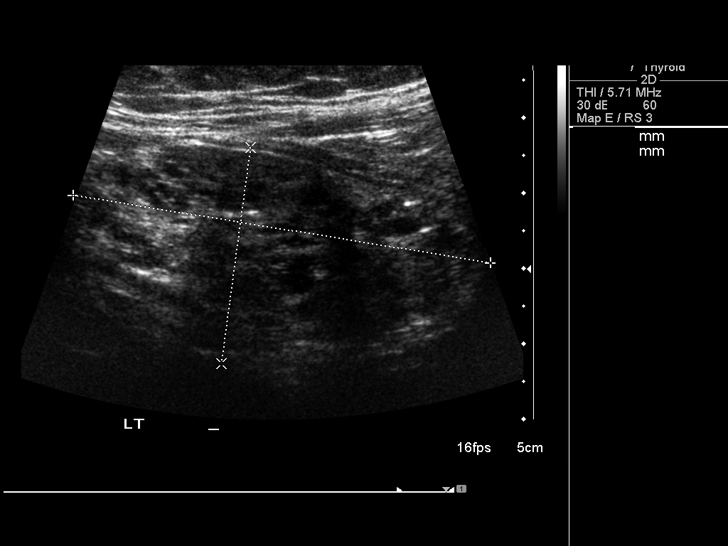
[im 9/53]
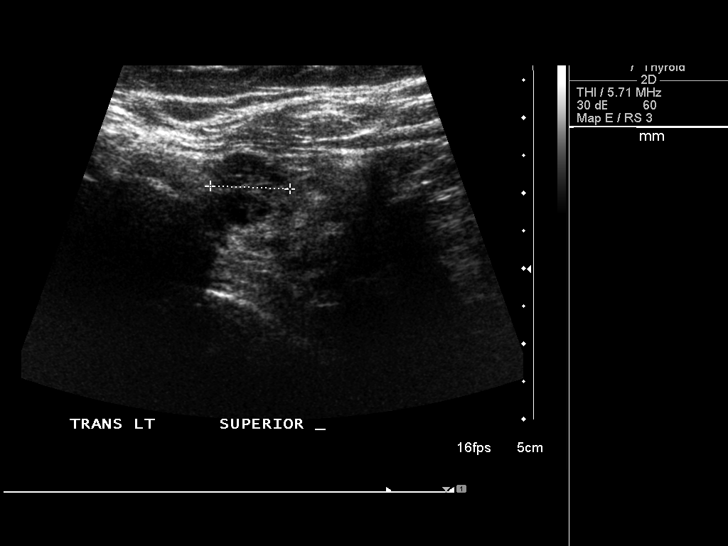
[im 14/53]
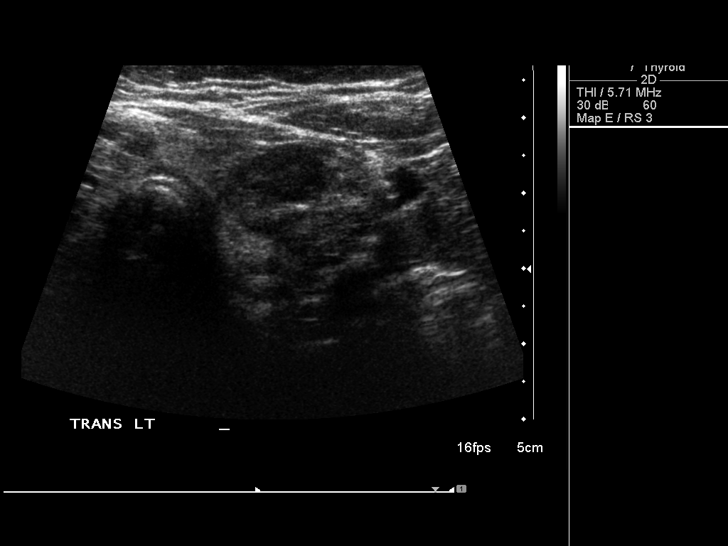
[im 18/53]
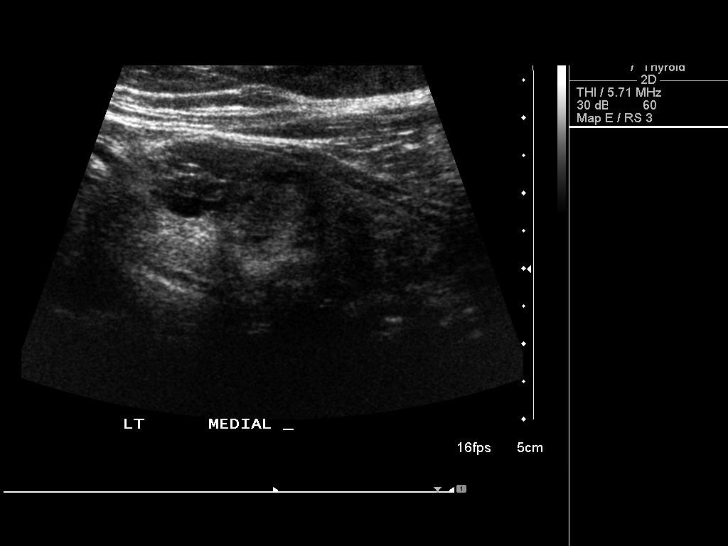
[im 22/53]
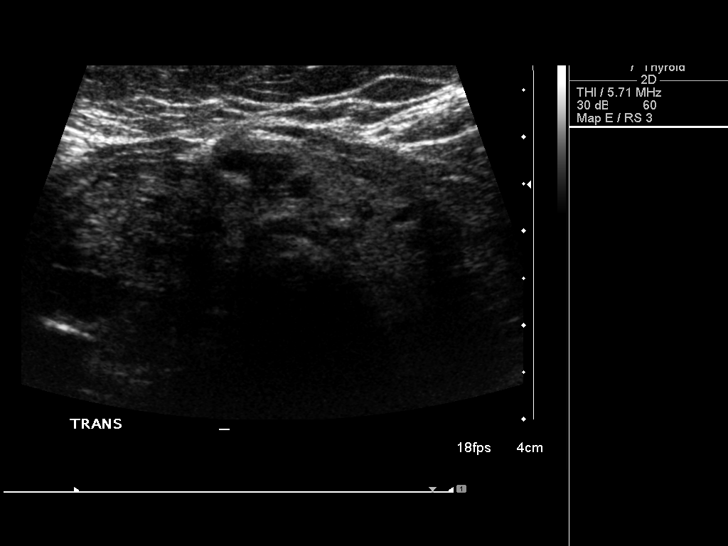
[im 27/53]
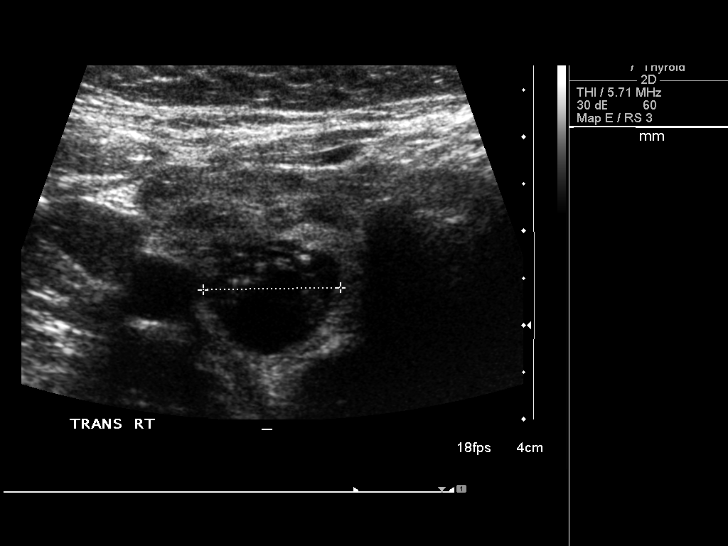
[im 31/53]
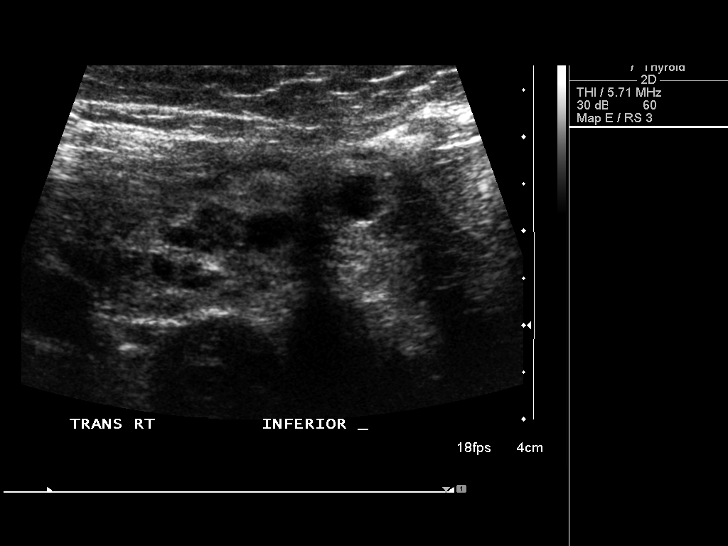
[im 35/53]
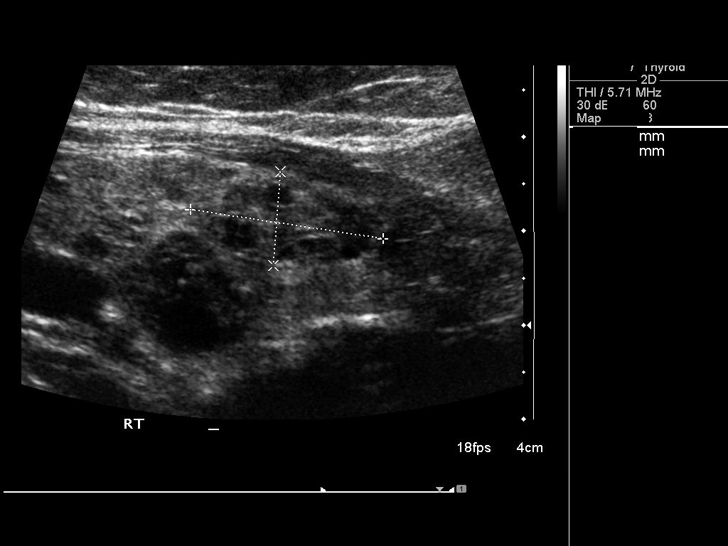
[im 40/53]
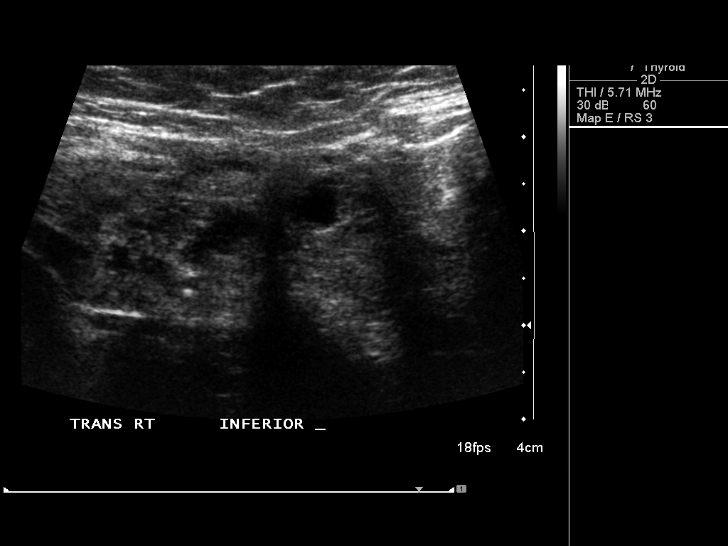
[im 44/53]
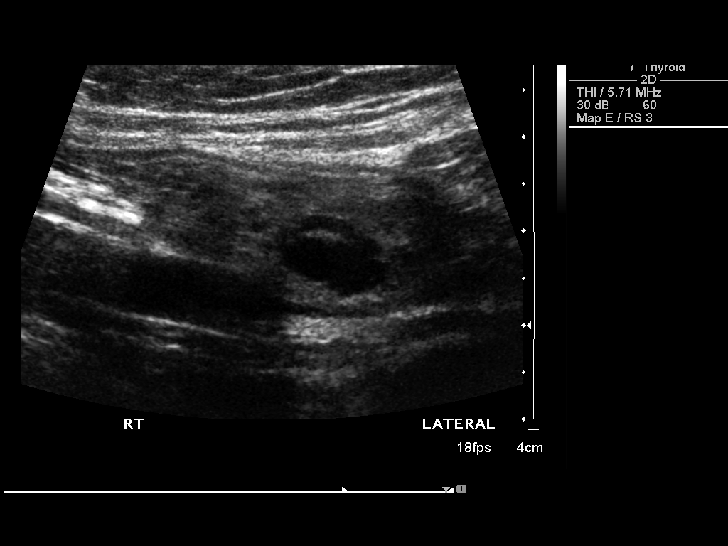
[im 48/53]
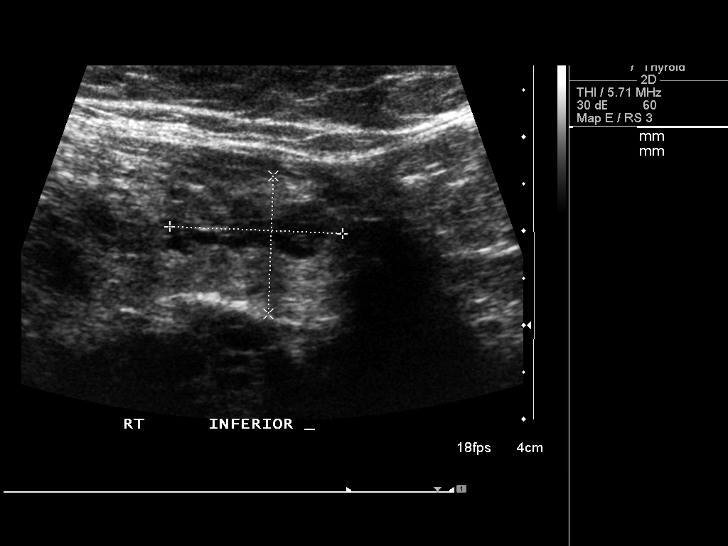
[im 53/53]
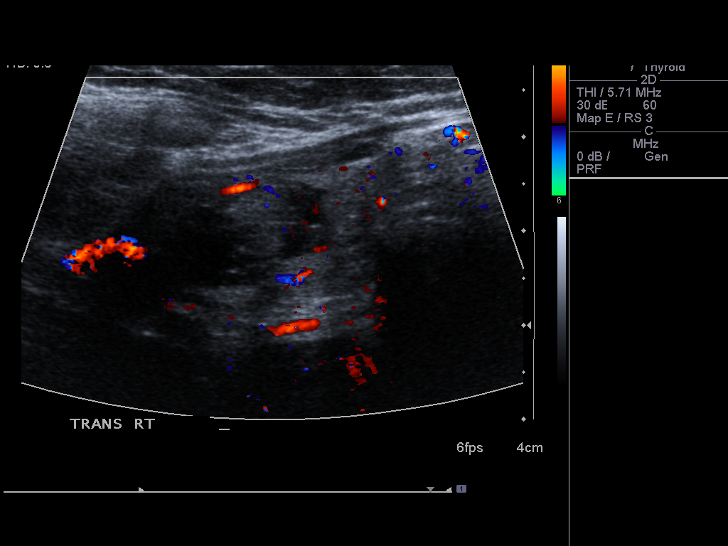

[13 of 25 positions shown; findings below may reference images not displayed]

FINDINGS: Right thyroid lobe

Measurements: 4.9 x 2.1 x 2.6 cm. Right thyroid tissue is diffusely
heterogeneous. There is a complex nodule in the right thyroid lobe
measuring 1.6 x 1.3 x 1.5 cm and probably measured 1.7 x 1.1 x
cm on the prior examination. There is a solid nodule in the inferior
right thyroid lobe measuring 2.1 x 1.0 x 1.5 cm. Question another
inferior right thyroid nodule measuring 1.8 x 1.5 x 1.7 cm. Thyroid
tissue is so heterogeneous that is difficult to differentiate
distinct nodules.

Left thyroid lobe

Measurements: 5.6 x 2.9 x 2.5 cm. Heterogeneous nodule in the
superior left thyroid lobe measures 1.2 x 1.0 x 1.1 cm. There is an
additional heterogeneous nodule in the left thyroid lobe measuring
1.2 x 0.9 x 1.3 cm. Left thyroid lobe is diffusely heterogeneous.

Isthmus

Thickness: 1.0 cm. Heterogeneous nodule in the isthmus measuring
x 1.0 x 0.8 cm.

Lymphadenopathy

None visualized.
IMPRESSION: Multinodular goiter. Difficult to compare nodules from the exam in
6990. Many of the nodular structures in the right thyroid lobe meet
criteria for ultrasound-guided biopsy. Consider biopsy of a dominant
right thyroid nodule.
